# Patient Record
Sex: Male | Born: 1950
Health system: Southern US, Community
[De-identification: ages and names within clinical notes are randomized; demographics above are authoritative.]

## PROBLEM LIST (undated history)

## (undated) DIAGNOSIS — Z992 Dependence on renal dialysis: Secondary | ICD-10-CM

## (undated) DIAGNOSIS — W19XXXA Unspecified fall, initial encounter: Secondary | ICD-10-CM

## (undated) DIAGNOSIS — H547 Unspecified visual loss: Secondary | ICD-10-CM

## (undated) DIAGNOSIS — Z9289 Personal history of other medical treatment: Secondary | ICD-10-CM

## (undated) DIAGNOSIS — R233 Spontaneous ecchymoses: Secondary | ICD-10-CM

## (undated) DIAGNOSIS — E1140 Type 2 diabetes mellitus with diabetic neuropathy, unspecified: Secondary | ICD-10-CM

## (undated) DIAGNOSIS — I10 Essential (primary) hypertension: Secondary | ICD-10-CM

## (undated) DIAGNOSIS — E119 Type 2 diabetes mellitus without complications: Secondary | ICD-10-CM

## (undated) DIAGNOSIS — K635 Polyp of colon: Secondary | ICD-10-CM

## (undated) DIAGNOSIS — N186 End stage renal disease: Secondary | ICD-10-CM

## (undated) DIAGNOSIS — E663 Overweight: Secondary | ICD-10-CM

## (undated) DIAGNOSIS — J189 Pneumonia, unspecified organism: Secondary | ICD-10-CM

## (undated) DIAGNOSIS — Z970 Presence of artificial eye: Secondary | ICD-10-CM

## (undated) DIAGNOSIS — N289 Disorder of kidney and ureter, unspecified: Secondary | ICD-10-CM

## (undated) DIAGNOSIS — R238 Other skin changes: Secondary | ICD-10-CM

## (undated) DIAGNOSIS — R011 Cardiac murmur, unspecified: Secondary | ICD-10-CM

## (undated) DIAGNOSIS — L0390 Cellulitis, unspecified: Secondary | ICD-10-CM

## (undated) DIAGNOSIS — E785 Hyperlipidemia, unspecified: Secondary | ICD-10-CM

## (undated) DIAGNOSIS — Z803 Family history of malignant neoplasm of breast: Secondary | ICD-10-CM

## (undated) DIAGNOSIS — I739 Peripheral vascular disease, unspecified: Secondary | ICD-10-CM

## (undated) DIAGNOSIS — I499 Cardiac arrhythmia, unspecified: Secondary | ICD-10-CM

## (undated) DIAGNOSIS — I959 Hypotension, unspecified: Secondary | ICD-10-CM

## (undated) DIAGNOSIS — M1990 Unspecified osteoarthritis, unspecified site: Secondary | ICD-10-CM

## (undated) DIAGNOSIS — C61 Malignant neoplasm of prostate: Secondary | ICD-10-CM

## (undated) DIAGNOSIS — N200 Calculus of kidney: Secondary | ICD-10-CM

## (undated) DIAGNOSIS — D649 Anemia, unspecified: Secondary | ICD-10-CM

## (undated) DIAGNOSIS — I2510 Atherosclerotic heart disease of native coronary artery without angina pectoris: Secondary | ICD-10-CM

## (undated) HISTORY — DX: Atherosclerotic heart disease of native coronary artery without angina pectoris: I25.10

## (undated) HISTORY — DX: Anemia, unspecified: D64.9

## (undated) HISTORY — DX: Unspecified visual loss: H54.7

## (undated) HISTORY — DX: Overweight: E66.3

## (undated) HISTORY — DX: Dependence on renal dialysis: Z99.2

## (undated) HISTORY — DX: Hyperlipidemia, unspecified: E78.5

## (undated) HISTORY — DX: Type 2 diabetes mellitus without complications: E11.9

## (undated) HISTORY — DX: Polyp of colon: K63.5

## (undated) HISTORY — PX: VARICOSE VEIN SURGERY: SHX832

## (undated) HISTORY — PX: COLONOSCOPY: SHX174

## (undated) HISTORY — DX: Spontaneous ecchymoses: R23.3

## (undated) HISTORY — DX: Other skin changes: R23.8

## (undated) HISTORY — DX: Family history of malignant neoplasm of breast: Z80.3

## (undated) HISTORY — DX: Essential (primary) hypertension: I10

## (undated) HISTORY — PX: FOOT AMPUTATION: SHX951

## (undated) HISTORY — PX: FRACTURE SURGERY: SHX138

## (undated) HISTORY — PX: RADIOFREQUENCY ABLATION KIDNEY: SHX2292

## (undated) HISTORY — PX: ENUCLEATION: SHX628

---

## 1990-04-04 HISTORY — PX: CHOLECYSTECTOMY OPEN: SUR202

## 1998-10-05 ENCOUNTER — Other Ambulatory Visit: Admission: RE | Admit: 1998-10-05 | Discharge: 1998-10-05 | Payer: Self-pay | Admitting: *Deleted

## 2000-03-24 ENCOUNTER — Ambulatory Visit (HOSPITAL_COMMUNITY): Admission: RE | Admit: 2000-03-24 | Discharge: 2000-03-24 | Payer: Self-pay | Admitting: Family Medicine

## 2000-03-31 ENCOUNTER — Encounter: Admission: RE | Admit: 2000-03-31 | Discharge: 2000-06-29 | Payer: Self-pay | Admitting: Family Medicine

## 2000-12-22 ENCOUNTER — Inpatient Hospital Stay (HOSPITAL_COMMUNITY): Admission: EM | Admit: 2000-12-22 | Discharge: 2000-12-27 | Payer: Self-pay | Admitting: Emergency Medicine

## 2000-12-22 ENCOUNTER — Encounter: Payer: Self-pay | Admitting: Internal Medicine

## 2001-01-01 ENCOUNTER — Encounter: Admission: RE | Admit: 2001-01-01 | Discharge: 2001-04-01 | Payer: Self-pay | Admitting: Internal Medicine

## 2001-05-20 ENCOUNTER — Encounter: Admission: RE | Admit: 2001-05-20 | Discharge: 2001-07-06 | Payer: Self-pay | Admitting: Internal Medicine

## 2001-09-17 ENCOUNTER — Encounter (HOSPITAL_BASED_OUTPATIENT_CLINIC_OR_DEPARTMENT_OTHER): Admission: RE | Admit: 2001-09-17 | Discharge: 2001-12-16 | Payer: Self-pay | Admitting: Internal Medicine

## 2001-11-21 ENCOUNTER — Encounter (INDEPENDENT_AMBULATORY_CARE_PROVIDER_SITE_OTHER): Payer: Self-pay | Admitting: *Deleted

## 2001-11-21 ENCOUNTER — Ambulatory Visit (HOSPITAL_COMMUNITY): Admission: RE | Admit: 2001-11-21 | Discharge: 2001-11-21 | Payer: Self-pay | Admitting: *Deleted

## 2001-11-21 ENCOUNTER — Encounter (INDEPENDENT_AMBULATORY_CARE_PROVIDER_SITE_OTHER): Payer: Self-pay | Admitting: Specialist

## 2001-11-29 ENCOUNTER — Encounter: Payer: Self-pay | Admitting: Ophthalmology

## 2001-12-04 ENCOUNTER — Ambulatory Visit (HOSPITAL_COMMUNITY): Admission: RE | Admit: 2001-12-04 | Discharge: 2001-12-05 | Payer: Self-pay | Admitting: Ophthalmology

## 2001-12-24 ENCOUNTER — Encounter (HOSPITAL_BASED_OUTPATIENT_CLINIC_OR_DEPARTMENT_OTHER): Admission: RE | Admit: 2001-12-24 | Discharge: 2001-12-28 | Payer: Self-pay | Admitting: Internal Medicine

## 2002-01-23 ENCOUNTER — Encounter (HOSPITAL_BASED_OUTPATIENT_CLINIC_OR_DEPARTMENT_OTHER): Admission: RE | Admit: 2002-01-23 | Discharge: 2002-04-23 | Payer: Self-pay | Admitting: Internal Medicine

## 2002-03-11 ENCOUNTER — Encounter: Payer: Self-pay | Admitting: Orthopedic Surgery

## 2002-03-11 ENCOUNTER — Encounter: Admission: RE | Admit: 2002-03-11 | Discharge: 2002-03-11 | Payer: Self-pay | Admitting: Orthopedic Surgery

## 2002-04-26 ENCOUNTER — Encounter (HOSPITAL_BASED_OUTPATIENT_CLINIC_OR_DEPARTMENT_OTHER): Admission: RE | Admit: 2002-04-26 | Discharge: 2002-07-25 | Payer: Self-pay | Admitting: Internal Medicine

## 2002-05-03 ENCOUNTER — Ambulatory Visit (HOSPITAL_COMMUNITY): Admission: RE | Admit: 2002-05-03 | Discharge: 2002-05-04 | Payer: Self-pay | Admitting: Ophthalmology

## 2002-05-05 ENCOUNTER — Emergency Department (HOSPITAL_COMMUNITY): Admission: EM | Admit: 2002-05-05 | Discharge: 2002-05-05 | Payer: Self-pay | Admitting: Emergency Medicine

## 2002-11-01 ENCOUNTER — Emergency Department (HOSPITAL_COMMUNITY): Admission: EM | Admit: 2002-11-01 | Discharge: 2002-11-01 | Payer: Self-pay | Admitting: Emergency Medicine

## 2003-02-03 ENCOUNTER — Emergency Department (HOSPITAL_COMMUNITY): Admission: EM | Admit: 2003-02-03 | Discharge: 2003-02-04 | Payer: Self-pay | Admitting: Emergency Medicine

## 2004-08-11 ENCOUNTER — Inpatient Hospital Stay (HOSPITAL_COMMUNITY): Admission: AD | Admit: 2004-08-11 | Discharge: 2004-08-13 | Payer: Self-pay | Admitting: Internal Medicine

## 2004-08-12 ENCOUNTER — Encounter (INDEPENDENT_AMBULATORY_CARE_PROVIDER_SITE_OTHER): Payer: Self-pay | Admitting: *Deleted

## 2005-10-16 ENCOUNTER — Emergency Department (HOSPITAL_COMMUNITY): Admission: EM | Admit: 2005-10-16 | Discharge: 2005-10-16 | Payer: Self-pay | Admitting: Emergency Medicine

## 2005-11-21 ENCOUNTER — Ambulatory Visit (HOSPITAL_COMMUNITY): Admission: RE | Admit: 2005-11-21 | Discharge: 2005-11-21 | Payer: Self-pay | Admitting: Ophthalmology

## 2005-11-21 ENCOUNTER — Encounter (INDEPENDENT_AMBULATORY_CARE_PROVIDER_SITE_OTHER): Payer: Self-pay | Admitting: *Deleted

## 2005-11-22 ENCOUNTER — Inpatient Hospital Stay (HOSPITAL_COMMUNITY): Admission: EM | Admit: 2005-11-22 | Discharge: 2005-11-26 | Payer: Self-pay | Admitting: Emergency Medicine

## 2005-11-22 ENCOUNTER — Ambulatory Visit: Payer: Self-pay | Admitting: Internal Medicine

## 2005-11-24 ENCOUNTER — Encounter: Payer: Self-pay | Admitting: Cardiology

## 2006-08-04 ENCOUNTER — Encounter: Admission: RE | Admit: 2006-08-04 | Discharge: 2006-08-04 | Payer: Self-pay | Admitting: Urology

## 2006-09-01 ENCOUNTER — Ambulatory Visit: Payer: Self-pay | Admitting: Cardiology

## 2006-09-13 ENCOUNTER — Encounter (HOSPITAL_COMMUNITY): Admission: RE | Admit: 2006-09-13 | Discharge: 2006-12-05 | Payer: Self-pay | Admitting: Nephrology

## 2006-09-29 ENCOUNTER — Ambulatory Visit: Payer: Self-pay

## 2006-10-03 ENCOUNTER — Ambulatory Visit: Payer: Self-pay

## 2006-10-20 ENCOUNTER — Ambulatory Visit: Payer: Self-pay | Admitting: Cardiology

## 2007-02-21 ENCOUNTER — Encounter: Admission: RE | Admit: 2007-02-21 | Discharge: 2007-02-21 | Payer: Self-pay | Admitting: Urology

## 2007-03-09 ENCOUNTER — Ambulatory Visit: Payer: Self-pay | Admitting: Cardiovascular Disease

## 2007-03-09 ENCOUNTER — Observation Stay (HOSPITAL_COMMUNITY): Admission: EM | Admit: 2007-03-09 | Discharge: 2007-03-10 | Payer: Self-pay | Admitting: Emergency Medicine

## 2007-03-20 ENCOUNTER — Encounter: Admission: RE | Admit: 2007-03-20 | Discharge: 2007-03-20 | Payer: Self-pay | Admitting: Urology

## 2007-04-13 ENCOUNTER — Ambulatory Visit (HOSPITAL_COMMUNITY): Admission: RE | Admit: 2007-04-13 | Discharge: 2007-04-14 | Payer: Self-pay | Admitting: Interventional Radiology

## 2007-04-13 ENCOUNTER — Encounter (INDEPENDENT_AMBULATORY_CARE_PROVIDER_SITE_OTHER): Payer: Self-pay | Admitting: Interventional Radiology

## 2007-04-27 ENCOUNTER — Ambulatory Visit: Payer: Self-pay | Admitting: Cardiology

## 2007-05-15 ENCOUNTER — Encounter: Admission: RE | Admit: 2007-05-15 | Discharge: 2007-05-15 | Payer: Self-pay | Admitting: Interventional Radiology

## 2007-06-01 ENCOUNTER — Encounter (HOSPITAL_COMMUNITY): Admission: RE | Admit: 2007-06-01 | Discharge: 2007-06-08 | Payer: Self-pay | Admitting: Nephrology

## 2007-06-15 ENCOUNTER — Ambulatory Visit: Payer: Self-pay | Admitting: Cardiology

## 2007-06-15 LAB — CONVERTED CEMR LAB
AST: 18 units/L (ref 0–37)
Albumin: 3 g/dL — ABNORMAL LOW (ref 3.5–5.2)
Alkaline Phosphatase: 100 units/L (ref 39–117)
BUN: 49 mg/dL — ABNORMAL HIGH (ref 6–23)
Calcium: 8.8 mg/dL (ref 8.4–10.5)
Chloride: 110 meq/L (ref 96–112)
Creatinine, Ser: 1.7 mg/dL — ABNORMAL HIGH (ref 0.4–1.5)
Total Bilirubin: 0.5 mg/dL (ref 0.3–1.2)
VLDL: 25 mg/dL (ref 0–40)

## 2007-08-03 ENCOUNTER — Ambulatory Visit: Payer: Self-pay | Admitting: Cardiology

## 2007-08-03 LAB — CONVERTED CEMR LAB
Albumin: 2.9 g/dL — ABNORMAL LOW (ref 3.5–5.2)
Alkaline Phosphatase: 103 units/L (ref 39–117)
Bilirubin, Direct: 0.1 mg/dL (ref 0.0–0.3)
LDL Cholesterol: 44 mg/dL (ref 0–99)
Total CHOL/HDL Ratio: 3.5

## 2007-11-09 ENCOUNTER — Ambulatory Visit: Payer: Self-pay | Admitting: Cardiology

## 2007-12-17 ENCOUNTER — Encounter: Admission: RE | Admit: 2007-12-17 | Discharge: 2007-12-17 | Payer: Self-pay | Admitting: Nephrology

## 2008-01-08 DIAGNOSIS — R609 Edema, unspecified: Secondary | ICD-10-CM

## 2008-01-08 DIAGNOSIS — F329 Major depressive disorder, single episode, unspecified: Secondary | ICD-10-CM

## 2008-01-08 DIAGNOSIS — I2510 Atherosclerotic heart disease of native coronary artery without angina pectoris: Secondary | ICD-10-CM

## 2008-01-08 DIAGNOSIS — E785 Hyperlipidemia, unspecified: Secondary | ICD-10-CM

## 2008-01-08 DIAGNOSIS — E663 Overweight: Secondary | ICD-10-CM

## 2008-01-08 DIAGNOSIS — E118 Type 2 diabetes mellitus with unspecified complications: Secondary | ICD-10-CM

## 2008-01-08 DIAGNOSIS — Z951 Presence of aortocoronary bypass graft: Secondary | ICD-10-CM

## 2008-01-08 DIAGNOSIS — F32A Depression, unspecified: Secondary | ICD-10-CM | POA: Insufficient documentation

## 2008-01-08 HISTORY — DX: Overweight: E66.3

## 2008-01-08 HISTORY — DX: Hyperlipidemia, unspecified: E78.5

## 2008-01-08 HISTORY — DX: Atherosclerotic heart disease of native coronary artery without angina pectoris: I25.10

## 2008-04-21 ENCOUNTER — Ambulatory Visit: Payer: Self-pay | Admitting: Cardiology

## 2008-04-21 LAB — CONVERTED CEMR LAB
ALT: 19 units/L (ref 0–53)
Albumin: 3.3 g/dL — ABNORMAL LOW (ref 3.5–5.2)
HDL: 39.6 mg/dL (ref >39.0)
Total Bilirubin: 0.8 mg/dL (ref 0.3–1.2)
Total CHOL/HDL Ratio: 2.8
Triglycerides: 89 mg/dL (ref 0–149)
VLDL: 18 mg/dL (ref 0–40)

## 2008-05-26 ENCOUNTER — Ambulatory Visit: Payer: Self-pay | Admitting: Cardiology

## 2008-06-04 ENCOUNTER — Encounter: Admission: RE | Admit: 2008-06-04 | Discharge: 2008-06-04 | Payer: Self-pay | Admitting: Interventional Radiology

## 2008-06-04 ENCOUNTER — Ambulatory Visit (HOSPITAL_COMMUNITY): Admission: RE | Admit: 2008-06-04 | Discharge: 2008-06-04 | Payer: Self-pay | Admitting: Interventional Radiology

## 2008-08-05 ENCOUNTER — Encounter: Payer: Self-pay | Admitting: Cardiology

## 2008-08-13 ENCOUNTER — Encounter: Payer: Self-pay | Admitting: Cardiology

## 2008-09-29 ENCOUNTER — Ambulatory Visit (HOSPITAL_COMMUNITY): Admission: RE | Admit: 2008-09-29 | Discharge: 2008-09-29 | Payer: Self-pay | Admitting: Nephrology

## 2008-12-04 ENCOUNTER — Encounter: Payer: Self-pay | Admitting: Cardiology

## 2009-01-01 ENCOUNTER — Encounter: Payer: Self-pay | Admitting: Cardiology

## 2009-01-27 ENCOUNTER — Encounter: Payer: Self-pay | Admitting: Gastroenterology

## 2009-01-27 DIAGNOSIS — D649 Anemia, unspecified: Secondary | ICD-10-CM

## 2009-01-27 DIAGNOSIS — Z992 Dependence on renal dialysis: Secondary | ICD-10-CM

## 2009-01-27 DIAGNOSIS — N186 End stage renal disease: Secondary | ICD-10-CM

## 2009-01-27 DIAGNOSIS — I10 Essential (primary) hypertension: Secondary | ICD-10-CM | POA: Insufficient documentation

## 2009-01-27 DIAGNOSIS — H409 Unspecified glaucoma: Secondary | ICD-10-CM | POA: Insufficient documentation

## 2009-01-27 HISTORY — DX: Essential (primary) hypertension: I10

## 2009-01-30 ENCOUNTER — Encounter: Payer: Self-pay | Admitting: Gastroenterology

## 2009-01-30 ENCOUNTER — Ambulatory Visit: Payer: Self-pay | Admitting: Gastroenterology

## 2009-02-03 ENCOUNTER — Telehealth: Payer: Self-pay | Admitting: Gastroenterology

## 2009-02-12 ENCOUNTER — Ambulatory Visit: Payer: Self-pay | Admitting: Gastroenterology

## 2009-02-12 DIAGNOSIS — D126 Benign neoplasm of colon, unspecified: Secondary | ICD-10-CM | POA: Insufficient documentation

## 2009-02-19 ENCOUNTER — Telehealth: Payer: Self-pay | Admitting: Gastroenterology

## 2009-02-20 ENCOUNTER — Ambulatory Visit (HOSPITAL_COMMUNITY): Admission: RE | Admit: 2009-02-20 | Discharge: 2009-02-20 | Payer: Self-pay | Admitting: Gastroenterology

## 2009-02-20 ENCOUNTER — Ambulatory Visit: Payer: Self-pay | Admitting: Gastroenterology

## 2009-03-18 ENCOUNTER — Encounter (INDEPENDENT_AMBULATORY_CARE_PROVIDER_SITE_OTHER): Payer: Self-pay | Admitting: *Deleted

## 2009-04-15 ENCOUNTER — Encounter: Payer: Self-pay | Admitting: Cardiology

## 2009-04-17 ENCOUNTER — Encounter: Payer: Self-pay | Admitting: Cardiology

## 2009-05-27 ENCOUNTER — Ambulatory Visit: Payer: Self-pay | Admitting: Cardiology

## 2009-05-27 DIAGNOSIS — I739 Peripheral vascular disease, unspecified: Secondary | ICD-10-CM

## 2009-06-09 ENCOUNTER — Encounter: Admission: RE | Admit: 2009-06-09 | Discharge: 2009-06-09 | Payer: Self-pay | Admitting: Interventional Radiology

## 2009-06-09 ENCOUNTER — Ambulatory Visit (HOSPITAL_COMMUNITY): Admission: RE | Admit: 2009-06-09 | Discharge: 2009-06-09 | Payer: Self-pay | Admitting: Interventional Radiology

## 2009-06-09 ENCOUNTER — Ambulatory Visit: Payer: Self-pay

## 2009-06-09 ENCOUNTER — Ambulatory Visit: Payer: Self-pay | Admitting: Cardiology

## 2009-07-27 ENCOUNTER — Encounter (HOSPITAL_COMMUNITY): Admission: RE | Admit: 2009-07-27 | Discharge: 2009-10-15 | Payer: Self-pay | Admitting: Nephrology

## 2009-10-23 ENCOUNTER — Encounter: Payer: Self-pay | Admitting: Cardiology

## 2009-11-27 ENCOUNTER — Encounter: Admission: RE | Admit: 2009-11-27 | Discharge: 2009-11-27 | Payer: Self-pay | Admitting: Nephrology

## 2009-12-02 ENCOUNTER — Ambulatory Visit: Payer: Self-pay | Admitting: Cardiology

## 2009-12-21 ENCOUNTER — Ambulatory Visit: Payer: Self-pay

## 2009-12-21 ENCOUNTER — Encounter: Payer: Self-pay | Admitting: Cardiology

## 2009-12-21 ENCOUNTER — Ambulatory Visit: Payer: Self-pay | Admitting: Internal Medicine

## 2009-12-21 ENCOUNTER — Ambulatory Visit (HOSPITAL_COMMUNITY): Admission: RE | Admit: 2009-12-21 | Discharge: 2009-12-21 | Payer: Self-pay | Admitting: Cardiology

## 2010-01-07 ENCOUNTER — Telehealth: Payer: Self-pay | Admitting: Gastroenterology

## 2010-01-21 ENCOUNTER — Ambulatory Visit (HOSPITAL_COMMUNITY): Admission: RE | Admit: 2010-01-21 | Discharge: 2010-01-22 | Payer: Self-pay | Admitting: Nephrology

## 2010-01-21 ENCOUNTER — Encounter (INDEPENDENT_AMBULATORY_CARE_PROVIDER_SITE_OTHER): Payer: Self-pay | Admitting: Nephrology

## 2010-01-27 ENCOUNTER — Ambulatory Visit: Payer: Self-pay | Admitting: Vascular Surgery

## 2010-02-02 HISTORY — PX: AV FISTULA PLACEMENT: SHX1204

## 2010-02-10 ENCOUNTER — Encounter: Admission: RE | Admit: 2010-02-10 | Discharge: 2010-02-10 | Payer: Self-pay | Admitting: Interventional Radiology

## 2010-02-16 ENCOUNTER — Ambulatory Visit: Payer: Self-pay | Admitting: Vascular Surgery

## 2010-02-16 ENCOUNTER — Ambulatory Visit (HOSPITAL_COMMUNITY): Admission: RE | Admit: 2010-02-16 | Discharge: 2010-02-16 | Payer: Self-pay | Admitting: Vascular Surgery

## 2010-02-18 ENCOUNTER — Ambulatory Visit: Payer: Self-pay | Admitting: Vascular Surgery

## 2010-03-04 ENCOUNTER — Ambulatory Visit: Payer: Self-pay | Admitting: Vascular Surgery

## 2010-03-12 ENCOUNTER — Ambulatory Visit (HOSPITAL_COMMUNITY)
Admission: RE | Admit: 2010-03-12 | Discharge: 2010-03-13 | Payer: Self-pay | Source: Home / Self Care | Attending: Interventional Radiology | Admitting: Interventional Radiology

## 2010-04-14 ENCOUNTER — Encounter
Admission: RE | Admit: 2010-04-14 | Discharge: 2010-04-14 | Payer: Self-pay | Source: Home / Self Care | Attending: Interventional Radiology | Admitting: Interventional Radiology

## 2010-04-14 ENCOUNTER — Ambulatory Visit (HOSPITAL_COMMUNITY)
Admission: RE | Admit: 2010-04-14 | Discharge: 2010-04-14 | Payer: Self-pay | Source: Home / Self Care | Attending: Interventional Radiology | Admitting: Interventional Radiology

## 2010-04-25 ENCOUNTER — Encounter: Payer: Self-pay | Admitting: Interventional Radiology

## 2010-04-25 ENCOUNTER — Encounter: Payer: Self-pay | Admitting: Nephrology

## 2010-04-25 ENCOUNTER — Encounter: Payer: Self-pay | Admitting: Urology

## 2010-05-02 LAB — CONVERTED CEMR LAB
ALT: 25 units/L (ref 0–53)
Albumin: 3.3 g/dL — ABNORMAL LOW (ref 3.5–5.2)
Alkaline Phosphatase: 70 units/L (ref 39–117)
Cholesterol: 121 mg/dL (ref 0–200)
LDL Cholesterol: 38 mg/dL (ref 0–99)
Total Protein: 5.7 g/dL — ABNORMAL LOW (ref 6.0–8.3)
Triglycerides: 91 mg/dL (ref 0.0–149.0)

## 2010-05-06 NOTE — Assessment & Plan Note (Signed)
Summary: per check out/sf   Visit Type:  rov Primary [name]:  Dr. Brigitte Pulse  CC:  no cardiac complaints today.  History of Present Illness: [name]  comes in today for evaluation and management of his coronary artery disease.  He continues to work out about 5 hours a day. He denies any angina or ischemic symptoms. His weight is down another 810 pounds.  His wife and his greatest concern today is about the future dialysis. His creatinine is up to 2.8. He is followed by Dr. Meredeth Ide of nephrology and his primary care doctor is Dr. Blenda Bridegroom. I reviewed his laboratory data with him today.  Remarkably, so cholesterol is 143 triglycerides 74 HDL 70 LDL 68. His hemoglobin A1c was 5.1% on no meds. He is only on low-dose Lipitor and aspirin.  His last stress Myoview was in 2008 which showed no ischemia or scar. His last echocardiogram was 2007 showed an ejection fraction of 55%.  He had arterial Dopplers in March of 2011. His ABIs were felt to be falsely elevated because of calcified vessels., however, his TBI was 0.83 on the left. He had triphasic flow through the posterior tibial artery and PDA. He's had a right trans-met amputation  Current Medications (verified): 1)  Lipitor 10 Mg Tabs (Atorvastatin Calcium) .... Take One Tablet By Mouth Daily. 2)  Aspirin 81 Mg Tabs (Aspirin) .[name].. 1 By Mouth Once Daily 3)  Procrit 20000 Unit/ml Soln (Epoetin Alfa) .... Every 2 Weeks 4)  Ferrous Sulfate 325 (65 Fe) Mg Tabs (Ferrous Sulfate) .[name].. 1 By Mouth Once Daily 5)  Finasteride 5 Mg Tabs (Finasteride) .[name].. 1 Tab Once Daily  Allergies: 1)  ! Codeine  Past History:  Past Medical History: Last updated: 01/26/2009 CAD Mixed Hyperlipidemia Diabetes Type 2 Morbid Obesity Chronic Kidney Disease Lower Extremity Edema Hypertension Renal Failure Varicose Veins/Phlebitis Normal left ventricular systolic function renal cell carcinoma status post ablation  Past Surgical History: Last updated:  01/27/2009 Lower Extremity Venectomy Cholecystectomy removal of both eyes removal left partial foot  Family History: Last updated: 02/12/2009 Family History of Cancer: Mother died @ 21, Father died @ 80. Family History of Coronary Artery Disease: Siblings Family History of Kidney Disease: father Family History of Breast Cancer: Family History of Ovarian Cancer: Family History of Lung Cancer: Brother Family History of Diabetes No FH of Colon Cancer:  Social History: Last updated: 01/27/2009 Disabled  Married with two sons Tobacco Use - No.  Alcohol Use - no Regular Exercise - no Drug Use - no  Risk Factors: Exercise: no (01/08/2008)  Risk Factors: Smoking Status: never (01/08/2008)  Review of Systems       negative other than history of present illness  Vital Signs:  Patient profile:   60 year old male Height:      76 inches Weight:      192.8 pounds BMI:     23.55 Pulse rate:   66 / minute Pulse rhythm:   irregular BP sitting:   136 / 72  (right arm) Cuff size:   large  Vitals Entered By: Julaine Hua, CMA (December 02, 2009 3:17 PM)  Physical Exam  General:  remarkably good shape from exercise, no acute distress, blind Head:  normocephalic and atraumatic Eyes:  blind Neck:  Neck supple, no JVD. No masses, thyromegaly or abnormal cervical nodes. Chest Wall:  no deformities or breast masses noted Lungs:  Clear bilaterally to auscultation and percussion. Heart:  PMI nondisplaced, normal S1-S2, no murmur or gallop. Carotids  equal bilaterally there bruit Msk:  Back normal, normal gait. Muscle strength and tone normal. Pulses:  left lower extremity with 1+ over 4+ dorsalis pedis, or other distal pulses difficult to feel, right lower extremity with partial habitation Extremities:  no edema Neurologic:  Alert and oriented x 3. Skin:  Intact without lesions or rashes. bruises of the upper extremity Psych:  depressed affect.     Impression &  Recommendations:  Problem # 1:  PVD (ICD-443.9) Assessment Unchanged  Problem # 2:  RENAL DISEASE, CHRONIC (ICD-593.9) Assessment: Deteriorated In anticipation  of potential dialysis, will perform a 2-D echocardiogram to assess LV function.  Problem # 3:  HYPERTENSION (ICD-401.9) Assessment: Improved  The following medications were removed from the medication list:    Furosemide 40 Mg Tabs (Furosemide) .[name]... 1 by mouth two times a day His updated medication list for this problem includes:    Aspirin 81 Mg Tabs (Aspirin) .[name]... 1 by mouth once daily  Problem # 4:  EDEMA (ICD-782.3) Assessment: Improved  Problem # 5:  HYPERLIPIDEMIA-MIXED (B2193296.4) Assessment: Improved  His updated medication list for this problem includes:    Lipitor 10 Mg Tabs (Atorvastatin calcium) .[name]... Take one tablet by mouth daily.  Problem # 6:  CAD, NATIVE VESSEL (ICD-414.01) Assessment: Unchanged  His updated medication list for this problem includes:    Aspirin 81 Mg Tabs (Aspirin) .[name]... 1 by mouth once daily  Orders: Echocardiogram (Echo)  Clinical Reports Reviewed:  Cardiac Cath:  08/30/2001: Cardiac Cath Findings:  Final Diagnosis: 1. Single-vessel coronary artery disease with 70% mid left anterior descending artery lesion with antegrade flow and good distal runoff. 2. Normal circumflex and right coronary arteries. 3. Normal left ventricular function. 4. Normal mitral and aortic valves. 5. Normal abdominal aorta and renal arteries.  Dispostion: Will follow up in the office in one week and review his clinical status and correlate with the cardiac catherization findings and treadmill exercise tolerence test. We also encouraged to increase activity and if not tolerated, he would  then be considered to be a candidate for angioplasty with stent placement in his mid left anterior descending artery lesion.  John R. Tysinger, M.D.  08/04/1995: Cardiac Cath Findings:  Conculsions: 1) Syncope,  eitology unknown. Probably vasa vagal. 2) AODM greater than 10 years. 3) Exogenous obesity. 4) GERD 5) Past history of lower leg cellulitis, venostasis disease and past varicose vein stripping. 6) Staus post cholecystecomy 1986.  Terance Ice, M.D. Cardiac Cath Findings:  Ejection Fraction:  >=50%.  30% LAD stenosis  Nuclear Study:  10/03/2006:  Excerise capacity: Addenosine with no exercise.  Blood Pressure response:  Clinical symptoms:  ECG impression: No significant ST segment change suggestive of ischemia.  Overall impression: There is no sign of scar or ischemia; ther is mild LVE and LV dysfunction.  Kirk Ruths, MD   Patient Instructions: 1)  Your physician recommends that you schedule a follow-up appointment in: 6 months with Dr. Verl Blalock 2)  Your physician has requested that you have an echocardiogram.  Echocardiography is a painless test that uses sound waves to create images of your heart. It provides your doctor with information about the size and shape of your heart and how well your heart's chambers and valves are working.  This procedure takes approximately one hour. There are no restrictions for this procedure. 3)  Your physician recommends that you continue on your current medications as directed. Please refer to the Current Medication list given to you today.

## 2010-05-06 NOTE — Progress Notes (Signed)
Summary: Schedule Recall Colonoscopy for Hospital  Phone Note Outgoing Call Call back at [facility] Phone [phone]   Call placed by: Genella Mech CMA Deborra Medina),  January 07, 2010 2:44 PM Summary of Call: Called pt to schedule hospital colonoscopy, no anwer L/M for pt to return call. Initial call taken by: Genella Mech CMA Deborra Medina),  January 07, 2010 2:46 PM  Follow-up for Phone Call        Just contacted pt to have recall colonoscopy, Pt states that he has to have a kidney biopsy, His nephrologisit just discovered a spot on his kidney, Pt stated "I Have to care of this first". I told pt to contact us as soon as he is available to have his procedure. Follow-up by: Genella Mech CMA Deborra Medina),  January 11, 2010 10:52 AM  Additional Follow-up for Phone Call Additional follow up Details #1::        FYI Dr Deatra Ina    Additional Follow-up for Phone Call Additional follow up Details #2::    ok Follow-up by: [name] MD,  January 11, 2010 11:13 AM

## 2010-05-06 NOTE — Assessment & Plan Note (Signed)
Summary: CAD/ANAS  Medications Added LIPITOR 20 MG TABS (ATORVASTATIN CALCIUM) Take 1 tablet by mouth every night FINASTERIDE 5 MG TABS (FINASTERIDE) 1 tab once daily        Visit Type:  rov Primary Provider:  Dr. Brigitte Pulse  CC:  no cardiac complaints today.  History of Present Illness: [name] comes in today for followup of his history of nonobstructive coronary disease, hypertension, hyperlipidemia, and peripheral vascular disease.  He has lost down to 199 pounds. At one time, he weighed 340 pounds.  He exercises about 45 hours a day according to his wife. He is legally blind but manages to do a stationary bicycle and other calisthenics.  He's had no chest pain or angina.  He has a history of peripheral vascular disease and has had a partial amputation of his right foot several years ago. This was from gangrene. He has not had followup arterial Dopplers.  Current Medications (verified): 1)  Lipitor 10 Mg Tabs (Atorvastatin Calcium) .... Take One Tablet By Mouth Daily. 2)  Aspirin 81 Mg Tabs (Aspirin) .[name].. 1 By Mouth Once Daily 3)  Furosemide 40 Mg Tabs (Furosemide) .[name].. 1 By Mouth Two Times A Day 4)  Procrit 20000 Unit/ml Soln (Epoetin Alfa) .... Every 2 Weeks 5)  Ferrous Sulfate 325 (65 Fe) Mg Tabs (Ferrous Sulfate) .[name].. 1 By Mouth Once Daily 6)  Finasteride 5 Mg Tabs (Finasteride) .[name].. 1 Tab Once Daily  Allergies: 1)  ! Codeine  Past History:  Past Medical History: Last updated: 01/26/2009 CAD Mixed Hyperlipidemia Diabetes Type 2 Morbid Obesity Chronic Kidney Disease Lower Extremity Edema Hypertension Renal Failure Varicose Veins/Phlebitis Normal left ventricular systolic function renal cell carcinoma status post ablation  Past Surgical History: Last updated: 01/27/2009 Lower Extremity Venectomy Cholecystectomy removal of both eyes removal left partial foot  Family History: Last updated: 02/12/2009 Family History of Cancer: Mother died @ 93, Father died  @ 30. Family History of Coronary Artery Disease: Siblings Family History of Kidney Disease: father Family History of Breast Cancer: Family History of Ovarian Cancer: Family History of Lung Cancer: Brother Family History of Diabetes No FH of Colon Cancer:  Social History: Last updated: 01/27/2009 Disabled  Married with two sons Tobacco Use - No.  Alcohol Use - no Regular Exercise - no Drug Use - no  Risk Factors: Exercise: no (01/08/2008)  Risk Factors: Smoking Status: never (01/08/2008)  Review of Systems       negative other than history of present illness  Vital Signs:  Patient profile:   60 year old male Height:      76 inches Weight:      199 pounds BMI:     24.31 Pulse rate:   54 / minute Pulse rhythm:   irregular BP sitting:   144 / 80  (left arm) Cuff size:   large  Vitals Entered By: Julaine Hua, CMA (May 27, 2009 4:14 PM)  Physical Exam  General:  no acute distress, eyes are closed, very interactive, alert oriented, he's lost a lot of weight Head:  normocephalic and atraumatic Eyes:  blind and mostly shut Neck:  Neck supple, no JVD. No masses, thyromegaly or abnormal cervical nodes. Chest [name]:  no deformities or breast masses noted Lungs:  Clear bilaterally to auscultation and percussion. Heart:  Non-displaced PMI, chest non-tender; regular rate and rhythm, S1, S2 without murmurs, rubs or gallops. Carotid upstroke normal, no bruit. Normal abdominal aortic size, no bruits. Femorals normal pulses, no bruits. Pedals normal pulses. No edema,  no varicosities. Msk:  Back normal, normal gait. Muscle strength and tone normal. Pulses:  pulses are difficult to palpate. He probably has a 1+ dorsalis pedis I can feel. Extremities:  partial dictation the right foot Neurologic:  Alert and oriented x 3. Skin:  Intact without lesions or rashes. Psych:  Normal affect.   Problems:  Medical Problems Added: 1)  Dx of Pvd  (E1707615.9)  Impression &  Recommendations:  Problem # 1:  CAD, NATIVE VESSEL (ICD-414.01) Assessment Unchanged  His updated medication list for this problem includes:    Aspirin 81 Mg Tabs (Aspirin) .[name]... 1 by mouth once daily  Orders: EKG w/ Interpretation (93000)  Problem # 2:  OVERWEIGHT/OBESITY (ICD-278.02) Assessment: Improved  Problem # 3:  HYPERTENSION (ICD-401.9) Assessment: Improved  His updated medication list for this problem includes:    Aspirin 81 Mg Tabs (Aspirin) .[name]... 1 by mouth once daily    Furosemide 40 Mg Tabs (Furosemide) .[name]... 1 by mouth two times a day  Problem # 4:  HYPERLIPIDEMIA-MIXED (ICD-272.4) Assessment: Improved He is due lipids and LFTs. We'll arrange these to be drawn. The following medications were removed from the medication list:    Lipitor 20 Mg Tabs (Atorvastatin calcium) .[name]... Take 1 tablet by mouth every night His updated medication list for this problem includes:    Lipitor 10 Mg Tabs (Atorvastatin calcium) .[name]... Take one tablet by mouth daily.  Problem # 5:  PVD (ICD-443.9) We will have arterial Dopplers performed.  Other Orders: Arterial Duplex Lower Extremity (Arterial Duplex Low)  Patient Instructions: 1)  Your physician recommends that you schedule a follow-up appointment in: [place] DUE AUGUST 2011 2)  Your physician recommends that you return for lab work in [place] 272.4 V58.69 SAME DAY AS LOWER ART DOP 3)  Your physician recommends that you continue on your current medications as directed. Please refer to the Current Medication list given to you today. 4)  Your physician has requested that you have a lower or upper extremity arterial duplex.  This test is an ultrasound of the arteries in the legs or arms.  It looks at arterial blood flow in the legs and arms.  Allow one hour for Lower and Upper Arterial scans. There are no restrictions or special instructions.[facility]II[facility] LIVER SAME DAY

## 2010-05-06 NOTE — Letter (Signed)
Summary: [facility] Office Note  [facility] Office Note   Imported By: Sallee Provencal 05/01/2009 11:01:47  _____________________________________________________________________  External Attachment:    Type:   Image     Comment:   External Document

## 2010-05-18 ENCOUNTER — Encounter: Payer: Self-pay | Admitting: Cardiology

## 2010-05-20 ENCOUNTER — Telehealth: Payer: Self-pay | Admitting: Cardiology

## 2010-05-26 NOTE — Progress Notes (Signed)
Summary: they need to know about ekg  Phone Note Call from Patient Call back at Home Phone [phone] Call back at [phone]   Caller: Spouse Reason for Call: Talk to Nurse, Talk to Doctor Summary of Call: calling to check on what was said about ekg that was sent over...they want to go out of town but waiting on the call to say it is ok please call asap Initial call taken by: Shelda Pal,  May 20, 2010 12:30 PM  Follow-up for Phone Call        see note from 2/16 at 1231pm.  Francetta Found, RN, BSN  May 20, 2010 12:32 PM  Follow-up by: Joelyn Oms RN,  May 21, 2010 9:10 AM  Additional Follow-up for Phone Call Additional follow up Details #1::        Pt and wife aware of ekg results. Horton Chin RN

## 2010-05-26 NOTE — Progress Notes (Addendum)
Summary: ekg results  Phone Note Call from Patient Call back at Home Phone [phone] Call back at [phone]   Caller: Spouse Summary of Call: pt wife wants to talk to a nurse re ekg results.  Initial call taken by: Regan Lemming,  May 20, 2010 10:08 AM  Follow-up for Phone Call        Pt called for ekg results as sent from Dr. Brigitte Pulse for review. DOD will review today. Follow-up by: Joelyn Oms RN,  May 20, 2010 11:29 AM  Additional Follow-up for Phone Call Additional follow up Details #1::        need EKG Additional Follow-up by: Renella Cunas, MD, [facility],  May 20, 2010 12:17 PM     Appended Document: ekg results Dr. Harrington Challenger reviewed EKG that was faxed yesterday and attempted to call Dr.Shaw but he was not in the office and is not set up with voice mail. Will have Dr.Markeith Jue call Dr.Shaw tomorrow about the EKG and follow up with the patient per Dr.Ross. I left a message on wife's cell phone with this information.

## 2010-06-14 LAB — CROSSMATCH: Unit division: 0

## 2010-06-14 LAB — GLUCOSE, CAPILLARY
Glucose-Capillary: 105 mg/dL — ABNORMAL HIGH (ref 70–99)
Glucose-Capillary: 116 mg/dL — ABNORMAL HIGH (ref 70–99)
Glucose-Capillary: 132 mg/dL — ABNORMAL HIGH (ref 70–99)
Glucose-Capillary: 98 mg/dL (ref 70–99)

## 2010-06-14 LAB — CBC
MCV: 97.2 fL (ref 78.0–100.0)
Platelets: 74 10*3/uL — ABNORMAL LOW (ref 150–400)
RDW: 13.6 % (ref 11.5–15.5)
WBC: 3.1 10*3/uL — ABNORMAL LOW (ref 4.0–10.5)

## 2010-06-14 LAB — BASIC METABOLIC PANEL
BUN: 81 mg/dL — ABNORMAL HIGH (ref 6–23)
Calcium: 8.7 mg/dL (ref 8.4–10.5)
Chloride: 108 mEq/L (ref 96–112)
Creatinine, Ser: 3.94 mg/dL — ABNORMAL HIGH (ref 0.4–1.5)
GFR calc Af Amer: 19 mL/min — ABNORMAL LOW (ref >60)
GFR calc non Af Amer: 16 mL/min — ABNORMAL LOW (ref >60)

## 2010-06-15 LAB — SURGICAL PCR SCREEN: Staphylococcus aureus: NEGATIVE

## 2010-06-15 LAB — POCT I-STAT 4, (NA,K, GLUC, HGB,HCT)
Glucose, Bld: 157 mg/dL — ABNORMAL HIGH (ref 70–99)
HCT: 28 % — ABNORMAL LOW (ref 39.0–52.0)
Potassium: 3.9 mEq/L (ref 3.5–5.1)

## 2010-06-15 LAB — GLUCOSE, CAPILLARY: Glucose-Capillary: 131 mg/dL — ABNORMAL HIGH (ref 70–99)

## 2010-06-16 LAB — CBC
HCT: 29.2 % — ABNORMAL LOW (ref 39.0–52.0)
Hemoglobin: 10 g/dL — ABNORMAL LOW (ref 13.0–17.0)
Hemoglobin: 10.8 g/dL — ABNORMAL LOW (ref 13.0–17.0)
MCH: 32.7 pg (ref 26.0–34.0)
MCH: 32.8 pg (ref 26.0–34.0)
MCH: 32.9 pg (ref 26.0–34.0)
MCHC: 34.2 g/dL (ref 30.0–36.0)
MCV: 95.4 fL (ref 78.0–100.0)
MCV: 95.8 fL (ref 78.0–100.0)
Platelets: 92 10*3/uL — ABNORMAL LOW (ref 150–400)
RBC: 3.06 MIL/uL — ABNORMAL LOW (ref 4.22–5.81)
RBC: 3.29 MIL/uL — ABNORMAL LOW (ref 4.22–5.81)

## 2010-06-16 LAB — COMPREHENSIVE METABOLIC PANEL
ALT: 45 U/L (ref 0–53)
Alkaline Phosphatase: 69 U/L (ref 39–117)
CO2: 27 mEq/L (ref 19–32)
Chloride: 109 mEq/L (ref 96–112)
GFR calc non Af Amer: 15 mL/min — ABNORMAL LOW (ref >60)
Glucose, Bld: 128 mg/dL — ABNORMAL HIGH (ref 70–99)
Potassium: 3.8 mEq/L (ref 3.5–5.1)
Sodium: 145 mEq/L (ref 135–145)
Total Bilirubin: 0.6 mg/dL (ref 0.3–1.2)

## 2010-06-16 LAB — DIFFERENTIAL
Eosinophils Absolute: 0.1 10*3/uL (ref 0.0–0.7)
Eosinophils Relative: 2 % (ref 0–5)
Lymphs Abs: 0.7 10*3/uL (ref 0.7–4.0)
Monocytes Relative: 7 % (ref 3–12)

## 2010-06-16 LAB — TYPE AND SCREEN

## 2010-06-16 LAB — RENAL FUNCTION PANEL
Albumin: 2.9 g/dL — ABNORMAL LOW (ref 3.5–5.2)
BUN: 99 mg/dL — ABNORMAL HIGH (ref 6–23)
Calcium: 8.7 mg/dL (ref 8.4–10.5)
Chloride: 110 mEq/L (ref 96–112)
Creatinine, Ser: 3.76 mg/dL — ABNORMAL HIGH (ref 0.4–1.5)
GFR calc Af Amer: 20 mL/min — ABNORMAL LOW (ref >60)
GFR calc non Af Amer: 17 mL/min — ABNORMAL LOW (ref >60)
Phosphorus: 5.5 mg/dL — ABNORMAL HIGH (ref 2.3–4.6)

## 2010-06-16 LAB — PLATELET FUNCTION ASSAY

## 2010-07-08 LAB — GLUCOSE, CAPILLARY: Glucose-Capillary: 77 mg/dL (ref 70–99)

## 2010-07-09 ENCOUNTER — Encounter: Payer: Self-pay | Admitting: Cardiology

## 2010-07-15 ENCOUNTER — Encounter: Payer: Self-pay | Admitting: Cardiology

## 2010-07-15 ENCOUNTER — Ambulatory Visit (INDEPENDENT_AMBULATORY_CARE_PROVIDER_SITE_OTHER): Payer: 59 | Admitting: Cardiology

## 2010-07-15 VITALS — BP 140/69 | HR 51 | Resp 18 | Ht 76.0 in | Wt 187.4 lb

## 2010-07-15 DIAGNOSIS — I2510 Atherosclerotic heart disease of native coronary artery without angina pectoris: Secondary | ICD-10-CM

## 2010-07-15 NOTE — Patient Instructions (Signed)
Your physician wants you to follow-up in: [place]. You will receive a reminder letter in the mail two months in advance. If you don't receive a letter, please call our office to schedule the follow-up appointment.   Your physician recommends that you continue on your current medications as directed. Please refer to the Current Medication list given to you today.

## 2010-07-15 NOTE — Progress Notes (Signed)
   Patient ID: [name], male    DOB: 23-Oct-1950, 60 y.o.   MRN: [id number]  HPI [name] comes in for follow up of his CAD. He continues to exercise extensively without CP or angina. He has normal LV function by Echo last fall. EKG by Dr Brigitte Pulse on 05/18/10 showed frequent asymptomatic PAC's. He continues to keep his weight down.    Review of Systems  All other systems reviewed and are negative.      Physical Exam  Nursing note and vitals reviewed. Constitutional: He is oriented to person, place, and time. He appears well-developed.       thin  HENT:  Head: Normocephalic and atraumatic.  Eyes:       blind  Neck: Neck supple. No JVD present. No tracheal deviation present. No thyromegaly present.       No carotid bruits  Cardiovascular: Normal rate, regular rhythm, normal heart sounds and intact distal pulses.   No murmur heard. Pulmonary/Chest: Effort normal and breath sounds normal.  Abdominal: Soft. Bowel sounds are normal.  Musculoskeletal: He exhibits no edema.  Neurological: He is alert and oriented to person, place, and time.  Skin: Skin is warm and dry.  Psychiatric: He has a normal mood and affect.

## 2010-07-15 NOTE — Assessment & Plan Note (Signed)
Stable. Wife and pt reassured that he has not had an MI, EF is normal by Echo in past year, and PAC's are of no consequence.

## 2010-08-16 ENCOUNTER — Other Ambulatory Visit: Payer: Self-pay | Admitting: Cardiology

## 2010-08-17 NOTE — Assessment & Plan Note (Signed)
 HEALTHCARE                            CARDIOLOGY OFFICE NOTE   NAME:Casa, KEMAURI DUTY                      MRN:          [id number]  DATE:11/09/2007                            DOB:          1950/05/29    Mr. Gragert comes in today for followup of the following issues,  1. Nonobstructive coronary artery disease.  2. Normal left ventricular systolic function.  3. Mixed hyperlipidemia.   His wife says that his creatinine is now up to 2.3.  He could not have a  nephrectomy for his hypernephroma.   He does 1500 squats a week!.  He is having no exertional angina or  dyspnea.   His meds are,  1. Avodart 0.5 daily.  2. Lipitor 10 mg a day (he was on 20, but his total cholesterol was      103, his LDL was 44 and his HDL dropped from 36 to 29.8).  3. Carvedilol 6.25 b.i.d., which was just cut in half because his      pressures have been running in the 80s outside our office.  4. Lantus 27 units q.p.m.  5. Furosemide 40 mg p.o. b.i.d.  6. Procrit 20,000 units one time a day.  7. Enteric-coated aspirin 81 mg a day.   His blood pressure today is 127/72, his pulse is 55 and regular, his  weight is 275, down 16.  HEENT, ruddy complexion, otherwise unchanged.  Neck shows no JVD.  Carotids are full without bruits.  Thyroid is not  enlarged.  Trachea is midline.  Lungs are clear.  Heart reveals soft S1  and S2, regular rate and rhythm.  Abdominal exam is soft, good bowel  sounds.  Extremities reveal no edema, partial foot amputation on the  right.  His pulses were diminished, but palpable.  Neuro exam is grossly  intact.  He is using a walker.   Mr. Bober is stable from our standpoint.  If his blood pressure gets  low once again, I have advised his wife to cut his carvedilol back to  3.125 mg p.o. b.i.d.  Otherwise, we will leave things unchanged.  I will  see him back again in 6 months.     Thomas C. Verl Blalock, MD, [facility]  Electronically Signed    TCW/MedQ   DD: 11/09/2007  DT: 11/10/2007  Job #: [id number]

## 2010-08-17 NOTE — Assessment & Plan Note (Signed)
[facility]                            CARDIOLOGY OFFICE NOTE   NAME:[name], Nicholas EYER                      MRN:          [id number]  DATE:04/27/2007                            DOB:          March 04, 1951    [name] returns today for management of his nonobstructive coronary  disease.  He is getting along remarkably well with a reduction in his  blood pressure, stabilization of his renal function according to his  wife.  He does recently diagnosed unfortunately, with a hypernephroma  and this was treated percutaneously by interventional radiology.  I am  not sure exactly what they did.   His medicines are unchanged since last visit.  He is on simvastatin 40  nightly.  His last creatinine that I have is was 2.5.   EXAM:  Blood pressure 116/70 his pulse 56 and regular.  He is in sinus  brady no EKG changes from the past.  Weighs 291 which is down 21 pounds!  He is in no acute distress.  Skin is warm and dry.  HEENT:  Unchanged.  Carotids are full.  No bruits.  Thyroid is not enlarged.  Trachea is  midline.  LUNGS:  Clear.  HEART:  Reveals a nondisplaced PMI.  Normal S1-S2.  ABDOMEN:  Soft.  EXTREMITIES:  No edema.  Pulses are present.   [name] is doing well.  I have made no changes program except I have  asked him to switch from simvastatin 40 to Lipitor 20 mg.  I think this  would be better tolerated with his renal insufficiency.  He will need  blood work in 6 weeks which we will arrange.     Thomas C. Verl Blalock, MD, [facility]  Electronically Signed    TCW/MedQ  DD: 04/27/2007  DT: 04/27/2007  Job #: [id number]   cc:   Donato Heinz, M.D.  Janifer Adie, M.D.

## 2010-08-17 NOTE — Assessment & Plan Note (Signed)
[facility]                            CARDIOLOGY OFFICE NOTE   NAME:Keeling, JAREN EILERT                      MRN:          [id number]  DATE:05/26/2008                            DOB:          Jul 18, 1950    [name] comes in today with his wife.  He has lost a tremendous amount of  weight and is now down to 240.  Ten years ago, he weighed 385.  In 2004,  he weighed 340!  His hemoglobin A1c is now 5.3.  His blood pressures  have been so low that they have had to stop his Carvedilol.   MEDICATIONS:  1. Lipitor 10 mg per day.  2. Furosemide 40 mg p.o. b.i.d.  3. Procrit 20,000 units 1 time each week.  4. Enteric-coated aspirin 81 mg a day.  5. Lexapro 10 mg per day.  6. Finasteride 5 mg per day.  7. Lantus 24 units at 8:00 p.m.  8. MetroGel lotion.  9. Sulfacetamide eye drops.   PHYSICAL EXAMINATION:  VITAL SIGNS:  His blood pressure today is 120/78,  his pulse 64 and regular, his weight is 240.  HEENT:  Remarkable for ruddy complexion.  His eyes are partially closed.  NECK:  Supple.  Carotid upstrokes are equal bilaterally without bruits.  Thyroid is not enlarged.  Trachea is midline.  CHEST:  Lungs are clear to auscultation and percussion.  HEART:  Soft S1 and S2.  No gallop.  ABDOMEN:  Soft.  Good bowel sounds.  No midline bruits.  EXTREMITIES:  No edema.  Pulses were present, but reduced.   EKG is normal except for low voltage.   ASSESSMENT/PLAN:  Mr. Ciraulo is stable from my standpoint.  He is  remarkably determined as his wife is supportive to do well.  He has  entered a 5K walk this spring, and he is also walking 2 hours a day in  place to avoid any potential injury on a treadmill or otherwise.  He has  lost a tremendous amount of weight.  He also does a fair number of  abdominal thrusts a day as well.   I have made no changes to his medical program.  We will plan on seeing  him back in a year.     Thomas C. Verl Blalock, MD, [facility]  Electronically Signed    TCW/MedQ  DD: 05/26/2008  DT: 05/27/2008  Job #: [id number]

## 2010-08-17 NOTE — Assessment & Plan Note (Signed)
[facility]                            CARDIOLOGY OFFICE NOTE   NAME:[name], [name]                      MRN:          [id number]  DATE:10/20/2006                            DOB:          05-24-1950    [name] returns today for a followup of his coronary artery disease.   His wife says he is doing much better and is getting great care from Dr.  Marval Regal of nephrology.  His stress Myoview was achieved on October 03, 2006.  It showed an EF of 46% with no significant ischemia or scar.  This is very stable compared to his previous objective studies.  Please  refer to my other notes.   I have reviewed his medications today and I am happy with that.  I  notice he is off an ACE inhibitor which I agree with, with his renal  dysfunction.  He is on hydralazine instead.   I have made no change in his program today.  I will see him back again  in 6 months.     Thomas C. Verl Blalock, MD, [facility]  Electronically Signed    TCW/MedQ  DD: 10/20/2006  DT: 10/20/2006  Job #: [id number]   cc:   Donato Heinz, M.D.

## 2010-08-17 NOTE — Procedures (Signed)
CEPHALIC VEIN MAPPING   INDICATION:  Preop for AV fistula.   HISTORY:  Worsening kidney function, renal cell carcinoma.   EXAM:  The right cephalic vein is partially compressible due to mild chronic  post thrombotic scarring.   Diameter measurements range from 0.28 to 0.52 cm.   The right basilic vein is partially compressible due to mild chronic  post thrombotic scarring.   Diameter measurements range from 0.29 to 0.40 cm.   The left cephalic vein is compressible.   Diameter measurements range from 0.43 to 0.80 cm.   The left basilic vein is not compressible with totally occlusive chronic  post thrombotic scarring noted.   See attached worksheet for all measurements.   IMPRESSION:  Patent bilateral cephalic vein and right basilic veins, as  described above, with diameter measurements noted.  The left basilic  vein appears totally occluded, as described above.   ___________________________________________  Judeth Cornfield. Scot Dock, M.D.   CH/MEDQ  D:  01/27/2010  T:  01/27/2010  Job:  [id number]

## 2010-08-17 NOTE — Consult Note (Signed)
VASCULAR SURGERY CONSULTATION   CLIFF, ROTHFELD  DOB:  October 28, 1950                                       01/27/2010  [id number]   I saw [name] in the office today for evaluation for hemodialysis  access.  This is a pleasant 60 year old gentleman with chronic kidney  disease secondary to longstanding diabetic nephropathy.  Of note, he has  a history of renal cell carcinoma of the right kidney and has undergone  previous radioablation.  Now it is felt that he may have a renal cell  carcinoma on the left.  He is scheduled to see the urologist tomorrow.  Given his progressive renal insufficiency, we were asked to evaluate him  for access.  Of note he is left-handed.  He denies any recent uremic  symptoms except for some increasing edema.  He denies fatigue, anorexia,  nausea, vomiting, palpitations, or shortness of breath.   PAST MEDICAL HISTORY:  His past medical history is significant for adult  onset diabetes, chronic kidney disease, renal cell carcinoma as  described above.  He denies any history of hypertension,  hypercholesterolemia, history of previous myocardial infarction, history  congestive heart failure, or tory of COPD.  In addition, he is blind.   SOCIAL HISTORY:  He is married.  He has 2 children.  He does not use  tobacco or alcohol.   FAMILY HISTORY:  There is no history of premature cardiovascular  disease.   REVIEW OF SYSTEMS:  GENERAL:  He has had no recent weight loss, weight  gain or problems with his appetite.  CARDIOVASCULAR:  He has had no chest pain, chest pressure, palpitations  or arrhythmias.  He has had no history of stroke, TIAs or amaurosis  fugax.  He has had no history of DVT or phlebitis.  GU:  He has had urinary frequency.  Also he has a renal cell carcinoma  as described above.  GI, neurologic, pulmonary, hematologic, ENT, musculoskeletal,  psychiatric, integumentary review of systems is unremarkable and is  documented on the medical history form in his chart.   PHYSICAL EXAMINATION:  This is a pleasant 60 year old gentleman who  appears his stated age.  Blood pressure is 112/67, heart rate is 73, saturation 100%.  HEENT:  He is blind.  HEENT is otherwise unremarkable.  LUNGS:  Clear bilaterally to auscultation.  CARDIOVASCULAR:  I do not detect any carotid bruits.  He has a regular  rate and rhythm.  HE Has palpable femoral pulses and warm well-perfused  feet.  He has palpable radial pulses.  ABDOMEN:  Soft and nontender with normal pitched bowel sounds.  MUSCULOSKELETAL:  There are no major deformities or cyanosis.  NEUROLOGIC:  There is no focal weakness or paresthesias.  SKIN:  There are no ulcers or rashes.   I have reviewed his records from Dr. Thyra Breed office.  His  creatinine was 3.23 on January 07, 2010.  GFR is 20 mL per minute,  potassium is 4.6.  His biopsy of the left upper pole mass on the kidney  shows a clear cell renal cell carcinoma.  I have also reviewed his MRI  which shows the stable right renal mass and also the left renal mass  which is suspicious for cancer.   I have independently interpreted his vein mapping which does show some  post thrombotic scarring in  the right side upper arm cephalic vein and  right upper arm basilic vein.  The left basilic vein is occluded  throughout.   Based on his exam and review of his vein mapping, it appears that his  best option for access would be a left forearm AV fistula.  If this is  not adequate, he could get an the upper arm AV fistula.  If neither are  adequate, he would require an AV graft.  I have discussed the  indications for surgery and potential complications including but not  limited to failure of the fistula to mature, graft thrombosis, graft  infection, steal syndrome, wound healing problems, arm swelling and  bleeding.  All of his questions were answered and he is agreeable to  proceed.  He is going to wait  until he is seen by the urologist before  calling to schedule his surgery.  He is currently not on dialysis.     [name]. Scot Dock, M.D.  Electronically Signed  CSD/MEDQ  D:  01/27/2010  T:  01/28/2010  Job:  [id number]

## 2010-08-17 NOTE — Consult Note (Signed)
[name], [name]               ACCOUNT NO.:  [id number]   MEDICAL RECORD NO.:  [id number]          PATIENT TYPE:  OBV   LOCATION:  62                         FACILITY:  [facility]   PHYSICIAN:  Aurea Graff, MD   DATE OF BIRTH:  [birth year]   DATE OF CONSULTATION:  03/10/2007  DATE OF DISCHARGE:  03/10/2007                                 CONSULTATION   HISTORY OF PRESENT ILLNESS:  This is a 60 year old white male with  diabetes, chronic kidney disease, and non-obstructive coronary artery  disease who was admitted yesterday after a spell at the anemia clinic of  acting confused and nearly passing out. He says both his blood pressure  and blood sugar were low. Apparently, his blood sugar was 57 before even  leaving his house and is not sure what is was at the clinic. He was  there to get a Procrit shot for his chronic anemia. Instead of receiving  the injection, he was transferred over the [facility]  emergency department and admitted. The patient denies actually losing  consciousness. He says he remembers everything that happened. He has had  significant diarrhea lately and feels dehydrated.   REVIEW OF SYSTEMS:  Negative for chest pain, shortness of breath,  dyspnea on exertion, syncope, or palpitations. Positive for diarrhea,  dehydration, fatigue, and blindness.   PAST MEDICAL HISTORY:  1. Diabetes mellitus with secondary blindness due to retinopathy and      chronic kidney disease secondary to nephropathy, approximately      stage 3 CKD.  2. Chronic anemia.  3. Non-obstructive coronary disease. According to Dr. Winnifred Friar clinic      note, he had a 30% to 50% distal LAD on catheterization from the      1990's.  4. Morbid obesity.  5. Pulmonary hypertension, probably due to obstructive sleep apnea      and/or obesity hypoventilation syndrome.   SOCIAL HISTORY:  He is married, unemployed, does not smoke. He tries to  get exercise by running in place  approximately every other day. He has 2  children.   FAMILY HISTORY:  Father died of renal cell carcinoma and also had COPD.  Mother died of breast cancer.   CURRENT MEDICATIONS:  1. Lovenox 65 mg SQ daily.  2. Aspirin 81 mg p.o. daily.  3. Coreg 12.5 mg p.o. b.i.d.  4. Zocor 40 mg p.o. daily.  5. Protonix 40 mg p.o. daily.  6. Proscar.   PHYSICAL EXAMINATION:  VITAL SIGNS:  Temperature 96.8, blood pressure  lying down 145/70, and sitting up 96.69. Heart rate 58 to 63. Saturation  96% on room air.  GENERAL:  A very pleasant, obese, white man in no distress, resting  comfortably in the bed.  HEENT:  He is blind with no light perception in both eyes. Face is  symmetric with normal strength. Speech is normal.  NECK:  Supple. Veins are flat. No carotid bruits.  LUNGS:  Clear to auscultation bilaterally without wheezing or rales.  CARDIAC:  Bradycardiac rate. Regular rhythm. No murmurs.  ABDOMEN:  Morbidly obese, soft, nontender, nondistended.  EXTREMITIES:  Trace chronic brawny edema. Part of the right foot is  surgically missing. Peripheral pulses and capillary refill are slightly  diminished.   LABORATORY DATA:  Electrocardiogram shows sinus bradycardia at 55 beats  per minute with first degree AV block, non-specific T wave changes and  positive U waves.   Sodium 144, potassium 3.9, BUN 57, creatinine 1.9, glucose 136,  hemoglobin 11.2.   IMPRESSION:  1. I was called to [facility] for repeated episodes      of bradycardia on the telemetry monitor. The monitor was      alarming for heart rates in the 20's and 30's. However, after      careful review of the telemetry tracings, it is clear that his      heart rate actually never goes below 50 beats per minute, but      rather the telemetry is under-sensing his R-waves. He does have      very low voltage, probably secondary to his body habitus. We      adjusted the gain on telemetry and things appear to be  better now.      The patient is resting comfortably in bed and has remained      asymptomatic.  2. Clearly, there is evidence of orthostatic hypotension and he is      being appropriately hydrated.  3. Please call us back if you have any further questions.      Aurea Graff, MD  Electronically Signed     NDL/MEDQ  D:  03/10/2007  T:  03/10/2007  Job:  [id number]

## 2010-08-17 NOTE — Assessment & Plan Note (Signed)
OFFICE VISIT   LEV, CIOLEK  DOB:  1950/09/20                                       03/04/2010  [id number]   The patient returns for followup today.  He was last seen on November 17  and had some erythema around his recently placed fistula.  He was placed  on doxycycline at that time.  He returns today for further followup.   On physical exam today the erythema has now completely resolved.  There  is an easily palpable thrill.  The fistula is visible all the way up to  the level of the elbow.  His incision is well-healed at this point.  The  fistula has healed completely.  There is an easily palpable bruit and  thrill.  This should be a good fistula for him long-term as it continues  to mature.  He will follow up with [place] on an as-needed basis.     Jessy Oto. Fields, MD  Electronically Signed   CEF/MEDQ  D:  03/04/2010  T:  03/05/2010  Job:  [id number]   cc:   Donato Heinz, M.D.

## 2010-08-17 NOTE — Assessment & Plan Note (Signed)
OFFICE VISIT   [name], [name]  DOB:  May 09, 1950                                       02/18/2010  [id number]   The patient was seen today as an add-on patient.  There were concerns  that he may have infection of a new AV fistula site on the left arm.  Dr. Scot Dock placed a left radiocephalic AV fistula in him on the  February 15, 2010.  His wife noticed that he has had some increasing  erythema around the area of the incision.  The patient does have a past  medical history of MRSA.   On physical exam, blood pressure is 161/70, heart rate is 56 and  regular.  There is mild erythema and some ecchymosis around the  incisions in forearm and left arm.  There is an audible bruit and  palpable thrill in the fistula.  There is no significant drainage  although apparently there was some drainage at the dialysis center  earlier today.   I believe most likely this represents postoperative trauma with some  ecchymosis and bruising.  However, due to the patient's previous MRSA  history and concerns from the family, I placed him on doxycycline today  100 mg twice a day #30 dispensed.  If the wound continues to  deteriorate, they will return for follow up sooner.  Otherwise they will  return for follow-up in two weeks' time to see Dr. Scot Dock for further  evaluation.     Jessy Oto. Fields, MD  Electronically Signed   CEF/MEDQ  D:  02/18/2010  T:  02/19/2010  Job:  [id number]   cc:   Judeth Cornfield. Scot Dock, M.D.  [facility]

## 2010-08-17 NOTE — H&P (Signed)
[name], CRAFT               ACCOUNT NO.:  [id number]   MEDICAL RECORD NO.:  [id number]          PATIENT TYPE:  OBV   LOCATION:  [id number]                         FACILITY:  [facility]   PHYSICIAN:  Sheila Oats, M.D.DATE OF BIRTH:  [birth year]   DATE OF ADMISSION:  03/09/2007  DATE OF DISCHARGE:  03/10/2007                              HISTORY & PHYSICAL   CHIEF COMPLAINT:  Low blood pressure in the office.   HISTORY OF PRESENT ILLNESS:  The patient is a 60 year old white male  with history significant for non-obstructive CAD, diabetes, status post  bilateral eye inoculation/blindness, peripheral vascular disease and  status post partial foot amputation, dyslipidemia, history of elevated  PSA, chronic kidney disease, anemia, on Procrit shots received at Dr.  Elissa Hefty office, who presents with above complaints. His wife is at  the bedside assisting with the history. They report that he was at Dr.  Elissa Hefty office for his Procrit shot when after standing up, he  stated that he felt very strange (as if he was in a different room from  the other people that he knew were right next to him.) When he  complained about this to the nursing staff at the office, his blood  pressure was checked and per his wife, his systolic was in the [id number] and  he was not give his Procrit shot for the day and they were asked to come  to the emergency room for possible IV hydration and further evaluation.  Per his wife, the patient did not fall and he did not lose  consciousness. The patient denies chest pain, shortness of breath,  palpitations, fevers, and no nausea and vomiting. He states that on the  day prior to this presentation, he had about 3 diarrheal stools over a  12 hour period but that ended 24 hours prior to presentation. He had not  had any further diarrhea. Per his wife, no melena or hematochezia. Upon  arrival in the emergency room, his blood pressure was 140/74. O2 sat of  96%. His  hemoglobin 11.2 and his creatinine 1.89 (last creatinine per  office note on January 30, 2007 was 1.62. He is admitted for hydration  and further observation.   PAST MEDICAL HISTORY:  1. As stated above.  2. Also history of diabetic retinopathy, neuropathy, and nephropathy.  3. History of depression.   MEDICATIONS:  1. Lantus 48 units q.h.s.  2. Lexapro 10 mg daily.  3. Humalog.  4. Aspirin 81 mg daily.  5. Carvedilol 12.5 mg b.i.d.  6. Finasteride 5 mg daily.  7. Ramipril 2.5 mg daily - just started this week per nephrology but      asked to hold off of the office when noted to be hypotensive.  8. Lasix 80 mg p.o. b.i.d.  9. Procrit shots at Dr. Elissa Hefty office.  10.Simvastatin 40 mg daily.   ALLERGIES:  CODEINE.   SOCIAL HISTORY:  The patient denies tobacco and alcohol.   FAMILY HISTORY:  Noncontributory.   REVIEW OF SYSTEMS:  As per HPI. Other review of systems negative.   PHYSICAL EXAMINATION:  GENERAL:  The patient is a pleasant middle aged  white male, in no apparent distress.  HEENT:  He is blind. Normocephalic and atraumatic. Slight dry mucous  membranes. No oral exudates.  VITAL SIGNS:  Temperature 96.8, blood pressure 140/74 with a pulse of  58, respiratory rate 20, O2 sat of 96%.  NECK:  Supple. No adenopathy, no thyromegaly, and no JVD.  LUNGS:  Clear to auscultation bilaterally. No crackles or wheezes.  CARDIOVASCULAR:  Regular rate and rhythm. Normal S1 and S2.  ABDOMEN:  Soft. Bowel sounds present. Nontender, nondistended, no  organomegaly. No masses palpable.  EXTREMITIES:  Una boots on. No lower leg cyanosis or edema.   LABORATORY DATA:  White cell count is 7.4. Hemoglobin and hematocrit  11.2 and 32.5. Platelet count 158,000. Sodium 144, potassium 3.9,  chloride 106, CO2 of 30. Glucose 136, BUN 57, creatinine 1.89.   ASSESSMENT/PLAN:  1. DIARRHEA:  Now resolved, probable gastroenteritis. Will obtain      stool studies, if any further episodes.  Hold off Lasix, and hydrate      with IV fluids. Follow-up.  2. ? HYPOTENSION:  At the office. Will obtain orthostatic vital signs.      His blood pressure 140/74 on arrival in the emergency room as      above. Likely secondary to hypovolemia and medications, recently      started on Ramipril as above.  3. ACUTE ON CHRONIC KIDNEY DISEASE:  Likely pre-renal as above. Hold      off Lasix, Ramipril, hydrate.  4. DIABETES MELLITUS:  Monitor Accu-checks. Continue Lantus, with      sliding scale insulin.  5. HISTORY OF CORONARY ARTERY DISEASE:  Obtain an EKG. The patient is      chest pain free. Continue his outpatient medications.  6. HISTORY OF ANEMIA:  Hemoglobin and hematocrit stable. Likely      secondary to his chronic kidney disease and he received Procrit in      the nephrology office.  7. DEPRESSION:  Continue outpatient medications.      Sheila Oats, M.D.  Electronically Signed     ACV/MEDQ  D:  03/11/2007  T:  03/11/2007  Job:  [id number]   cc:   Donato Heinz, M.D.  Fax: [phone]

## 2010-08-17 NOTE — Assessment & Plan Note (Signed)
[name]                            CARDIOLOGY OFFICE NOTE   NAME:[name], Nicholas MORGANO                      MRN:          [id number]  DATE:09/01/2006                            DOB:          10-27-1950    The patient is self-referred today after a four year absence from the  practice.   [name] is a delightful, yet very unfortunate, 60 year old married  white male who comes with his wife today. He has a history of coronary  artery disease by catheterization in 1997. At that time, he had a 30-50%  mid LAD beyond a second diagonal, and normal left ventricular function.  He has been treated medically.   I have not seen him in four years.   Last week, he had an episode where he was walking down the hall and got  weak and disoriented. He was not responding appropriate to his wife. He  denied any chest discomfort and subsequently recovered from this.   He has recently been evaluated by Dr. Bradd Burner with blood work. He had a  potassium of 6.1 on 08/20/06 per his wife. He was on potassium and it was  discontinued. Follow up potassium was the same and his creatinine was up  to 2.5 from 1.6. He is on Kayexalate now and has some follow up blood  work this coming Tuesday.   He is fairly inactive. He has no symptoms of angina or ischemia at  present. He has no orthopnea or PND. He has some peripheral edema.   PAST MEDICAL HISTORY:  He is intolerant of codeine.   CURRENT MEDICATIONS:  1. Aspirin 81 mg daily.  2. Multivitamin.  3. Catapres patch TTS 3 weekly.  4. Amlodipine 10 mg daily.  5. Lisinopril 40 mg daily.  6. Carvedilol 12.5 mg b.i.d.  7. Furosemide 40 mg daily.  8. Sulfamethoxazole 2 daily.  9. Simvastatin 40 mg daily.  10.Lexapro 10 mg daily.  11.Kayexalate 4 times daily.  12.Lantus as prescribed.   His other medical problems include severe diabetic retinopathy status  post right eye enucleation. He has had a history of malignant  hypertension, type-2 diabetes, chronic anemia, and depression. He also  suffers from diabetic neuropathy and now nephropathy.   He does not smoke. He does not drink or use recreational drugs.   In addition to eye enucleation, he is now had vein stripping in both  legs. He has a cholecystectomy. He has had removal of part of his right  foot.   FAMILY HISTORY:  Really non-contributory.   SOCIAL HISTORY:  He is not employed. He is totally blind with bilateral  enucleations. He is married and his wife is extremely faithful and is  with him today. He has two children.   REVIEW OF SYSTEMS:  Other than the HPI, he has a history of allergies,  hay fever, and chronic fatigue.   PHYSICAL EXAMINATION:  GENERAL:  He is sitting in a wheelchair. He is  blind.  VITAL SIGNS:  Blood pressure 155/79, pulse 62. His EKG shows sinus  rhythm with first degree AV with no change  from his past studies. He has  poor R-wave progression across the intra-precordium. He is 6 foot 4  inches and weighs 330 pounds.  HEENT:  Normocephalic except for his eyes. He has a ruddy complexion.  Facial symmetry is normal.  NECK:  Supple, carotids are full without bruits, there is no JVD,  thyroid is not enlarged, trachea is midline.  LUNGS:  Clear.  HEART:  Reveals a poorly appreciated PMI. He has a soft S1, S2, without  gallop.  ABDOMEN:  Protuberant with good bowel sounds.  EXTREMITIES:  Reveal 1+ edema, pulses were present, but reduced,  posterior tibial.  NEUROLOGIC:  He is in a wheelchair. He is totally blind.  SKIN:  Shows some ecchymosis, otherwise benign.   ASSESSMENT AND PLAN:  Mr. Milia has so many medical problems that it  is difficult to even get a grasp of them after a 4 1/2 year absence from  our practice. I have given his wife the following information:  I think  that his heart is doing well at the present time. He had a 2D  echocardiogram on November 23, 2005 which showed normal left ventricular   systolic function, no important valvular problems with mild left atrial  enlargement. He has a history of coronary artery disease dating back to  1997 which hopefully has not changed significantly, but most likely has.  He is certainly asymptomatic, but he is very inactive.   His other problems need more urgent attention including his hyperkalemia  and his renal insufficiency. I have asked her to follow up with his  blood work next Tuesday and to follow up as scheduled with nephrology  next Friday.   We will arrange for a stress nuclear study at some point in the near  future to rule out obstructive coronary disease. I will leave his  hypertension management to Dr. Bradd Burner and nephrology.     Thomas C. Verl Blalock, MD, [facility]  Electronically Signed    TCW/MedQ  DD: 09/01/2006  DT: 09/02/2006  Job #: [id number]   cc:   Janifer Adie, M.D.  [facility]

## 2010-08-20 NOTE — Discharge Summary (Signed)
[facility]  Patient:    [name], [name] Visit Number: [id number] MRN: [id number]          Service Type: MED Location: [phone] Attending Physician:  Horatio Pel Dictated by:  Lynnell Chad. Shelia Media, M.D. Admit Date:  12/21/2000 Discharge Date: 12/27/2000   CC:         Foot Clinic   Discharge Summary  LABORATORY RESULTS:  Initial white count 13.0, at discharge 4.9.  Initial hemoglobin13.6, by discharge 11.2 with an MCV of 83.  Platelet count 178. Initialleft shift.  Initial glucose 504.  Sodium 138, potassium 3.5. Potassiumwent as low as 2.5 but was 3.9 by the time of discharge.  BUN 25, creatinine 0.9 initially, by discharge 12/0.6.  Albumin was low, as low as 2.6. Hepatic panel was normal except for a slightly elevated total bilirubin initially.  Total cholesterol 146 with LDL of 71, HDL of 47.  Microalbumin was elevated at 16.9.  Urinalysis showed greater than 1000 glucose.  Blood culture negative x 2.  Urine culture 40,000 group B Step.  C. difficile toxin negative.  RADIOLOGY RESULTS:  Mild enlargement of lymph node of left groin, varicose veins left calf, nonthrombosed, no DVT.  [id number] sinus rhythm, nonspecific T-wave abnormality.  No old tracings.  HOSPITAL COURSE:  Please see admission history and physical for details of [name] presentation.  Briefly, he presented with fever and progressive redness and swelling of the left lower leg.  DVT was ruled outand he was treated with antibiotics for cellulitis and initially put on the Glucommander. He gradually defervesced.  He had problems with nausea and vomiting through the rest of the hospital stay but felt well by the time of discharge.  He was felt to be probably depressed and started on Zoloft.  Hypokalemia was treated appropriately.  IMPRESSION: 1. Cellulitis of leg. 2. Diabetes mellitus type 2 out of control. 3. Probable depression. 4. Medical noncompliance, had not  been medications. 5. Intolerance GLUCOPHAGE--diarrhea. 6. Hypokalemia--treated. 7. Low serum albumin. 8. Obesity.  DISPOSITION:  He is discharged home.  DISCHARGE MEDICATIONS: 1. Aspirin 81 mg daily. 2. Actos30 mg each morning. 3. Amaryl 4 mg each morning. 4. Augmentin 875 mg twice a day through September 30. 5. Wellbutrin SR 150 mg p.o. daily for three days and then twice a day.  DISCHARGE INSTRUCTIONS:  He is to check his CBGs four times a day and return to see me in two weeks.  Follow up at diabetic clinic at [facility] and also at the foot clinic at [facility] for his heel ulcer on Monday, January 01, 2001.  CONDITION:  Improved. Dictated by:   Lynnell Chad. Shelia Media, M.D. Attending Physician:  Horatio Pel DD:  12/30/00 TD:  12/30/00 Job: [id number]

## 2010-08-20 NOTE — Op Note (Signed)
   TNAMENIKET, TRIGGS                        ACCOUNT NO.:  [id number]   MEDICAL RECORD NO.:  [id number]                   PATIENT TYPE:  AMB   LOCATION:  ENDO                                 FACILITY:  [facility]   PHYSICIAN:  Waverly Ferrari, M.D.                 DATE OF BIRTH:  14-Oct-1950   DATE OF PROCEDURE:  11/21/2001  DATE OF DISCHARGE:                                 OPERATIVE REPORT   PROCEDURE:  Colonoscopy, with polypectomy and biopsy.   INDICATIONS:  Gonorrhea, colon polyps.   ANESTHESIA:  Demerol 20 mg, Versed 2 mg.   DESCRIPTION OF PROCEDURE:  With the patient mildly sedated in the left  lateral decubitus position, the Olympus videoscopic colonoscope was inserted  into the rectum  and passed under direct vision to the cecum -- identified  by the ileocecal valve and appendiceal orifice, both of which are  photographed.  We entered into the terminal ileum, which also appeared  normal.  From this point the colonoscope was slowly withdrawn, taking  circumferential views of the remaining colonic mucosa and stopping it only  in the cecum -- where a poly was seen, photographed, and removed using snare  cautery technique with setting of 20/20 blended current.  The tissue was  retrieved .  The rectum appeared normal in direct and showed some smaller  hemorrhoids in retroflex view.  The colonoscope was straightened and  withdrawn.  The patient's vital signs and pulse oximetry remained stable.  The patient tolerated the procedure well and all without apparent  complications.   FINDINGS:  Polyp in the cecum; removed.  Random colon biopsies were taken,  of normal-appearing mucosa.  Internal hemorrhoids.   PLAN:  Await biopsy report.  The patient will call me for results and follow  up with me as an outpatient.                                               Waverly Ferrari, M.D.    GMO/MEDQ  D:  11/21/2001  T:  11/21/2001  Job:  [id number]

## 2010-08-20 NOTE — H&P (Signed)
[facility]  Patient:    [name], [name] Visit Number: [id number] MRN: [id number]          Service Type: EMS Location: ED Attending Physician:  Tor Netters Dictated by:   Jene Every, M.D. Admit Date:  12/21/2000   CC:         Lynnell Chad. Shelia Media, M.D.   History and Physical  ADMITTING DIAGNOSES: 1. Diabetes. 2. Stasis ulcers. 3. Cellulitis. 4. Nausea and vomiting.  HISTORY OF PRESENT ILLNESS:  This 60 year old white male presented to the [facility] Emergency Department after one day of nausea, vomiting and diaphoresis.  The patient reported left lower leg pain.  The patient states that he had some chills and then developed a fever.  The patient noticed some reddening of his leg and felt that he had cellulitis.  He has had similar bouts in the past secondary to what he describes as vascular insufficiency. The patient also has a "blister" on his heel, which he has had over one months, which has not been healing.  The patient denies any trauma to the area.  The patient denies any other lesions.  PAST MEDICAL HISTORY: 1. Diabetes mellitus. 2. History of cholecystectomy.  MEDICATIONS:  The patient takes an aspirin.  The patient is supposed to be on Glucophage, unknown doseage.  However, he has not taken this for three months.  ALLERGIES:  Codeine, which caused the patient to "past out."  FAMILY HISTORY:  Significant for mother having cancer of the breasts and ovaries, father having emphysema and questionable kidney carcinoma.  He had a brother who had coronary artery disease before the age of 48 and another brother who had coronary artery disease in his mid 39s.  SOCIAL HISTORY:  The patient is married with two children.  No tobacco.  No alcohol.  He lives alone with his wife.  REVIEW OF SYSTEMS:  As above.  HEENT: The patient denies any blurred vision. GENERAL:  The patient denies any weight gain or fatigue.  He has had  some polydipsia.  CARDIOVASCULAR: The patient denies any chest pain.  RESPIRATORY: The patient denies any breathing problems.  GENITOURINARY: The patient admits to increased urinary frequency.  OBJECTIVE DATA:  VITAL SIGNS:  Temperature 99.8, pulse 90, blood pressure 137/60, respirations 20.  GENERAL:  Large man in no obvious distress lying comfortably on a [place].  HEENT:  Normocephalic.  No papal edema.  TMs clear.  Oropharynx with pink and moist mucosa.  NECK:  Supple.  No thyromegaly.  No bruits.  No adenopathy.  HEART:  Regular rhythm.  No murmurs, rubs, or gallops.  LUNGS:  Clear.  No rales.  No wheezes.  ABDOMEN:  Soft and nontender.  Positive bowel sounds.  EXTREMITIES:  No cyanosis or clubbing.  Trace edema bilaterally to the midtibia.  SKIN:  The patient has an approximate 2 x 2 inch grade 2 ulcer on his left heel.  The patient also has some erythema of the left lateral leg.  ASSESSMENT AND PLAN: 1. Stasis ulcer.  After evaluation, this is probably the most serious of the    patients problems.  Will get a skin care consult and start the patient on    IV antibiotics.  The patient may actually need surgical therapy if this    does not heal.  I did check for pulses on physical examination, and I was    not very impressed with his pedal or posterior tibial pulses.  However, the  patient did have some edema, which made this difficult to accurately    assess. 2. Diabetes.  The patients diabetes is very poorly controlled.  The patient    has not been taking his medications for several months.  The patient will    be placed on sliding scale insulin and we will resume his Glucophage.  The    patient does not appear to be in hyperosmolic nonketotic syndrome or    ketoacidosis, which would not be expected given the fact that he is a    type 2.  The patient will be given IV hydration and subcu insulin as    needed to hopefully get his sugars at a more reasonable level.  The  patient    seems to be fairly stable at this point in time.  I think his initial    symptoms of nausea, vomiting and diaphoresis were all the result of the    combination of his poorly-controlled diabetes and his infection. Dictated by:   Jene Every, M.D. Attending Physician:  Tor Netters DD:  12/22/00 TD:  12/22/00 Job: [id number]

## 2010-08-20 NOTE — Op Note (Signed)
NAME:  [name], [name]                         ACCOUNT NO.:  [id number]   MEDICAL RECORD NO.:  [id number]                   PATIENT TYPE:  REC   LOCATION:  FOOT                                 FACILITY:  [place]   PHYSICIAN:  Garey Ham, M.D.             DATE OF BIRTH:  [birth year]   DATE OF PROCEDURE:  05/03/2002  DATE OF DISCHARGE:                                 OPERATIVE REPORT   PREOPERATIVE DIAGNOSIS:  Advanced proliferative diabetic retinopathy with  recurrent vitreous hemorrhage left eye, status post previous pars plana  vitrectomy left eye with endolaser photocoagulation 12/05/01.   POSTOPERATIVE DIAGNOSIS:  Advanced proliferative diabetic retinopathy with  recurrent vitreous hemorrhage left yee, status post previous pars plana  vitrectomy left eye with endolaser photocoagulation 12/05/01.   NAME OF OPERATION:  Pars plana vitrectomy using vitreous infusion suction  cutter, endolaser photocoagulation.   SURGEON:  Garey Ham, M.D.   ASSISTANT:  Nurse.   ANESTHESIA:  General.   OPTHALMOSCOPY:  No preoperative view due to dense vitreous hemorrhage.   OPERATIVE PROCEDURE:  After the patient was prepped and draped, the lid  speculum was inserted in the left eye.  The eye was turned downward and  superior rectus traction suture placed.  A peritomy was performed adjacent  to the limbus from the 9:30 to 4 o'clock position. Three sclerotomy sites  were prepared, 3.5 mm from the limbus at the 10, 2 and 4 o'clock position  using the MVR blade.  The 4 mm vitreous infusion terminal was secured in  place at the 4 o'clock position with a 5-0 white mattress dacron suture.  Due to the dense vitreous hemorrhage the tip could not be visualized.  The  fiberoptic light pipe was inserted at the 2 o'clock position and Handpiece  vitreous infusion suction cutter at the 10 o'clock position.  Slow vitreous  infusion suction cutting were begun from an anterior to posterior direction.  There were considerable blood attached to the posterior surface of the lens,  making visualization difficult.  Some of this was aspirated and the  procedure continued with slow aspiration of the diffuse vitreous hemorrhage.  Scleral depression was also used inferiorly to aspirate the blood clots  which were adherent to the peripheral retina.  Gradually the vitreous  cleared enough to visualize the retina which appeared to be attached.  The  optic nerve appeared pale and the blood vessels slightly attenuated.  There  were pools of preretinal blood which were aspirated with the handpiece of  the vitreous infusion suction cutter and the dutch tip brush aspirator.  The  previous laser photocoagulation was seen.  There appeared to be a preretinal  fibrosis or membranes along the temporal blood vessels, these appeared to be  extremely adherent and it was felt risky to manipulate these fearing  possible retinal tearing.  The macular area appeared to be clear of these  membranes.  It was therefore elected to assemble the endolaser  photocoagulator and an additional [id number] applications were made in the  peripheral retina.  The fundus was inspected with indirect ophthalmoscopy  and scleral depression and there appeared to be no peripheral retinal tears  or detachment area seen.  It was then elected to close.  The sclerotomy  sites were closed with 7-0 interrupted Vicryl sutures.  The conjunctivae was  pulled forward with a running 7-0 Vicryl suture.  A small abrasion or  epithelial deterioration due to his due to his weak endothelium was noted  inferiorly.  Maxitrol ointment and Atropine ointment were instilled in the  conjunctival cul-de-sac.  The irrigating contact lens was used throughout  the procedure, and the cornea was kept hydrated throughout the procedure  with the irrigation fluid under the contact lens.  A light patch and  protector shield were applied after dexamethasone and garamycin had  been  injected in the  subtenon space and Maxitrol and Atropine ointment applied to the  conjunctival cul-de-sac.  Duration of procedure 1-1/2 hours.  The patient  tolerated the procedure well in general, left the operating room for the  recovery room in good condition.                                               Garey Ham, M.D.    HNJ/MEDQ  D:  05/03/2002  T:  05/03/2002  Job:  [id number]

## 2010-08-20 NOTE — Discharge Summary (Signed)
[name], [name]               ACCOUNT NO.:  [id number]   MEDICAL RECORD NO.:  [id number]          PATIENT TYPE:  INP   LOCATION:  5150                         FACILITY:  [place]   PHYSICIAN:  Corinna L. Conley Canal, MDDATE OF BIRTH:  Aug 08, 1950   DATE OF ADMISSION:  11/22/2005  DATE OF DISCHARGE:  11/26/2005                                 DISCHARGE SUMMARY   DISCHARGE DIAGNOSES:  1. Vomiting.  2. Malignant hypertension.  3. Edema.  4. Suspected obstructive sleep apnea, consider outpatient polysomnogram.  5. Recent right eye enucleation.  6. Diabetes.  7. Renal insufficiency.  8. Resolved hypernatremia.  9. Anemia.  10.History of diabetic retinopathy, nephropathy and neuropathy.  11.History of previous left eye enucleation.  12.Depression.   DISCHARGE MEDICATIONS:  1. Lasix 40 mg a day.  2. Potassium 20 mEq daily.  3. His Toprol has been increased to 100 mg a day.  4. Lisinopril increased to 20 mg a day.  5. Continue multivitamin.  6. Aspirin 81 mg a day.  7. Lexapro 10 mg a day.  8. Glimepiride 4 mg a day.  9. Simvastatin 40 mg a day.  10.Norvasc 10 mg a day.  11.Catapres patch TTS-3 weekly.  12.He also is currently on TobraDex ointment to the right eye four times      daily.   Follow up with Dr. Zadie Rhine on Monday.  Follow up with Dr. Bradd Burner next week  for blood pressure check and basic metabolic panel.   CONSULTATIONS:  Dr. Zadie Rhine and Dr. Haroldine Laws   PROCEDURES:  PICC line.   CONDITION:  Stable.   ACTIVITY:  He uses a walker.   DIET:  Low salt, diabetic.   PERTINENT LABORATORIES:  On admission, his CBC was significant for a  hemoglobin of 11, hematocrit 32, sodium 159, potassium 3.9, chloride 110,  bicarbonate 20, glucose 168, BUN 25, creatinine 1.5.  At discharge, his  sodium had normalized and his creatinine was 1.7, cardiac enzymes negative,  TSH 1.877.  UA showed a specific gravity of greater than 1.030, large  hemoglobin, small bilirubin, greater than  300 protein, negative nitrate and  negative leukocyte esterase.  Blood cultures have been negative.   SPECIAL STUDIES AND RADIOLOGY:  Chest x-ray showed bibasilar atelectasis.  CT of the brain showed chronic ischemic changes, atrophy, soft tissue  swelling over the right orbit, no evidence of periorbital cellulitis.  Echocardiogram was a difficult study, but showed an ejection fraction of  about 50% to 55%, mildly-to-moderately increased left ventricular wall  thickness, mildly dilated left atrium.  EKG showed normal sinus rhythm.   HISTORY AND HOSPITAL COURSE:  [name] is a pleasant, 60 year old, white  male patient of Dr. Bradd Burner with multiple medical problems, who had  undergone an enucleation of the right eye on August 20, due to severe  diabetic retinopathy and pain.  He began having intractable vomiting and  severe pain.  He was brought to the emergency room and found to have a blood  pressure of 192/87, otherwise normal vital signs.  He also had worsening  edema in his hands and feet over  the past several weeks to months.  There  was concern that he might have an infection causing the vomiting, and he was  admitted to step-down.  He was started initially empirically on antibiotics.  CAT scan was done, and patient was seen by ophthalmology.  Dr. Zadie Rhine did  not feel that this represented an infection and that it was all  postoperative changes.  He did recommend TobraDex ointment q.i.d. and gauze  over the area to catch any drainage.  Patient was initially thought to be in  right heart failure.  An echo was done, which showed fairly well preserved  ejection fraction, but it had poor acoustic windows.  Dr. Haroldine Laws was  consulted and felt that patient's edema was most likely multifactorial,  secondary to probable untreated obstructive sleep apnea and obesity  hypoventilation syndrome with possibly some venous insufficiency.  There was  no evidence of left heart failure.  Patient  was started on IV diuretics, and  his blood pressure medications were adjusted upwards.  He was also given IV  Reglan and IV fluids, as well as Iv pain medications.  At this time, his  diet has been advanced, and he has had no vomiting for several days.  His  pain is much better, as well.  He required a PICC line due to poor IV  access.  His antibiotics have been stopped, as there is no sign of  infection.  His blood pressure has still been relatively high, but this may  be somewhat pain related.  I have asked that he follow up closely with Dr.  Bradd Burner to check the blood pressure and the BMET.  Total time on the day of  discharge is 45 minutes.      Corinna L. Conley Canal, MD  Electronically Signed     CLS/MEDQ  D:  11/26/2005  T:  11/26/2005  Job:  [id number]

## 2010-08-20 NOTE — Op Note (Signed)
NAME:  [name], [name]                         ACCOUNT NO.:  [id number]   MEDICAL RECORD NO.:  [id number]                   PATIENT TYPE:  OIB   LOCATION:  2550                                 FACILITY:  [place]   PHYSICIAN:  Garey Ham, M.D.             DATE OF BIRTH:  10/14/1950   DATE OF PROCEDURE:  12/04/2001  DATE OF DISCHARGE:                                 OPERATIVE REPORT   PREOPERATIVE DIAGNOSES:  1. Chronic and recurrent vitreous hemorrhage left eye.  2. Proliferative diabetic retinopathy.   POSTOPERATIVE DIAGNOSES:  1. Chronic and recurrent vitreous hemorrhage left eye.  2. Proliferative diabetic retinopathy.   OPERATION:  Pars plana vitrectomy using vitreous infusion suction cutter,  endolaser panretinal photocoagulation, preretinal membrane peeling and  excision.   SURGEON:  Garey Ham, M.D.   ASSESSMENT:  Nurse.   ANESTHESIA:  General anesthesia.   OPHTHALMOSCOPY:  As previous described.   DESCRIPTION OF PROCEDURE:  After the patient was prepped and draped, a lid  speculum was inserted in the left eye.  The eye was turned downward and a  superior rectus traction suture placed.  A peritomy was performed adjacent  to the limbus from the 9:30 to 4 o'clock position superiorly. The  subconjunctival tissue was cleaned and three sclerotomy sites repaired at  the 10, 2 and 4 o'clock position using the MVR blade.  The 4 mm vitreous  infusion terminal was secured in place at the 4 o'clock position with a 5-0  white mattress Dacron suture. The tip of the same projecting into the  vitreous cavity. The fiberoptic light plate was inserted at the 10 o'clock  position and the fiberoptic light pipe was inserted at the 2 o'clock  position and a hand piece of the vitreous infusion suction cutter at the 10  o'clock position.  Slow vitreous infusion suction cutting were begun from an  anterior to posterior direction toward the retina. Dense vitreous hemorrhage  was  encountered as the retina was approached, there appeared to be  proliferative diabetic retinopathy especially along the inferior temporal  retinal blood vessel with a proliferative stalk in this area.  A preretinal  membrane covered the entire posterior pole.  The Michael's pick was used to  gradually tease the membrane and peel it from the retinal surface in several  locations.  It was then excised and cut with a vitreous infusion suction  cutter.  Once again, the Michael's pick was reintroduced and further  membrane peeling achieved.  There was considerable preretinal hemorrhage  trapped by this preretinal membrane.  This was gradually aspirated with the  [place] brush tip aspirator.  There were several small points of proliferation  in several locations holding the retina to the preretinal membrane.  There  was some minor bleeding from these sites.  Following removal of the surface  blood, the endolaser photocoagulator was assembled and 1043 burns achieved  using  a scattered technique over the posterior retina outside the vascular  arcades.  Good laser burn reaction was achieved.  Indirect ophthalmoscopy  and scleral depression was used to check the peripheral fundus revealing no  retinal tears or detachment areas.  It was then elected to close.  The  sclerotomy sites were closed with interrupted 7-0 Vicryl sutures.  The  conjunctiva was then pulled forward and closed with a running 7-0 Vicryl  suture.  Depo, Garamycin and Celestone were injected in the subtenon space  inferiorly.  A light patch and vitrectomy shield along with Maxitrol and  atropine was applied.   DURATION OF PROCEDURE:  One and one half hours.   DISPOSITION:  The patient tolerated the procedure well in general and left  the operating room for the recovery room in good condition.                                               Garey Ham, M.D.    HNJ/MEDQ  D:  12/04/2001  T:  12/04/2001  Job:  [id number]

## 2010-08-20 NOTE — H&P (Signed)
NAME:  [name], [name]                         ACCOUNT NO.:  [id number]   MEDICAL RECORD NO.:  [id number]                   PATIENT TYPE:  REC   LOCATION:  FOOT                                 FACILITY:  [facility]   PHYSICIAN:  Garey Ham, M.D.             DATE OF BIRTH:  1950/04/07   DATE OF ADMISSION:  04/26/2002  DATE OF DISCHARGE:                                HISTORY & PHYSICAL   OUTPATIENT NOTE:  This was a planned outpatient readmission of this 60-year-  old white male, non-insulin dependent diabetic, admitted for additional  vitreal retinal surgery of the left eye.   HISTORY OF PRESENT ILLNESS:  This patient has a long history of non-insulin  dependent diabetes mellitus, poorly controlled with secondary complications  of diabetic retinopathy in both eyes.  The patient had developed  proliferative diabetic retinopathy in the left eye with vitreous hemorrhage  and was previously admitted on 12/04/01 for a posterior vitrectomy surgery.  The patient did well at surgery and was discharged the following day.  He  returned to the office with vision 20/70, suddenly he developed a  postoperative hemorrhage and vision was reduced to light perception.  This  has not cleared and the patient has elected to proceed with additional  surgery.  He signed an informed consent and arrangements made for his  outpatient readmission at this time.   PAST MEDICAL HISTORY:  The patient continues under the care of Dr. Marcello Moores C.  Wall, and Dr. Lynnell Chad. Pharr, for his general health.   MEDICATIONS:  The patient takes multiple medications including Lipitor,  Toprol, Amaryl.  Previously took Actos, potassium, Demodex, aspirin 81 mg.   ALLERGIES:  CODEINE.   PHYSICAL EXAMINATION:  VITAL SIGNS:  Blood pressure 133/78, respirations 18,  pulse 64, temperature 96.7.  GENERAL APPEARANCE:  The patient is a large, well-nourished, well-developed  male in no acute distress.  HEENT:  Visual acuity less  than 20/50 right eye, light perception left eye.  Applanation tonometry 14 mm right eye, 12 mm left eye.  External ocular and  slit-lamp examination; the eyes are white and clear with a clear cornea,  deep and clear anterior chamber, the lens is relatively clear.  FUNDUS:  A detailed fundus examination reveals proliferative diabetic  retinopathy of the right eye with panretinal laser photocoagulation and some  gaps.  The left eye shows a dense, diffuse hemorrhage, and no details are  seen.  B-scan ultrasonography shows dense vitreous hemorrhage, but no  retinal detachment.  CHEST:  Lungs clear to percussion and auscultation.  HEART:  Normal sinus rhythm, no cardiomegaly, no murmurs.  ABDOMEN:  Negative.  EXTREMITIES:  Negative.   ADMISSION DIAGNOSIS:  Recurrent vitreous hemorrhage left eye secondary to  proliferative diabetic retinopathy.   SURGERY PLANNED:  Pars plana vitrectomy with additional endolaser  photocoagulation.  Garey Ham, M.D.   HNJ/MEDQ  D:  05/03/2002  T:  05/03/2002  Job:  [id number]

## 2010-08-20 NOTE — H&P (Signed)
[name], SCHIERMAN               ACCOUNT NO.:  [id number]   MEDICAL RECORD NO.:  [id number]          PATIENT TYPE:  INP   LOCATION:  5507                         FACILITY:  [facility]   PHYSICIAN:  [name], M.D.DATE OF BIRTH:  1951/03/01   DATE OF ADMISSION:  08/11/2004  DATE OF DISCHARGE:                                HISTORY & PHYSICAL   PRIMARY CARE PHYSICIAN:  [name], M.D.   CHIEF COMPLAINT:  Cough and fevers.   HISTORY OF PRESENT ILLNESS:  The patient is a 60 year old white male with a  past medical history significant for diabetes mellitus, hypertension,  blindness, status post amputation of right forefoot, who presents with  complaints of chest congestion and a nonproductive cough times about one  week and also fevers x 2 days.  He was seen by his primary care physician,  Dr. Bradd Burner, and given IM Rocephin at the office and then prescribed  Levaquin and he came back for a followup visit today but was still febrile  and also complained of nausea, vomiting, and so he was directly admitted to  the medical floor for IV antibiotics and further evaluation and management.  The patient also reports generalized soreness and denies any trauma.  He  also denies chest pain, PND, orthopnea, melena, hematochezia.  The wife  reports that she has noted some slight swelling in his lower extremities and  the patient states that he feels somewhat short of breath when he is  coughing.  He denies any history of tobacco use, although he reports  exposure to second hand smoke.  He denies polyuria polydipsia.   PAST MEDICAL HISTORY:  1.  As stated above.  2.  History of diabetic retinopathy in both eyes.  3.  History of iritis.   MEDICATIONS:  1.  Lantus 15 units q.p.m.  2.  Toprol XL 50 mg every day.  3.  Norvasc 10 mg p.o. every day.  4.  Lexapro 10 mg p.o. every day.  5.  Lipitor 20 mg p.o. every day.  6.  Amaryl 4 mg p.o. every day.  7.  Catapres-TTS-3 patch every  week.  8.  Alphagan.  9.  Pred Forte 1%.  10. Lisinopril 10 mg every day.  11. Alcon.  12. Tessalon Perles 200 q.8h.   ALLERGIES:  1.  HYDROCODONE.  2.  CODEINE.   SOCIAL HISTORY:  He denies tobacco, also denies alcohol use.   FAMILY HISTORY:  His father died of kidney cancer and emphysema.  His mother  died of breast cancer.  His brothers have heart disease and hypertension.   REVIEW OF SYSTEMS:  As per HPI, other comprehensive review of systems  negative.   PHYSICAL EXAMINATION:  GENERAL:  The patient is a morbidly obese, middle-  aged, white male in no acute respiratory distress.  VITAL SIGNS:  His temperature is 98.4, pulse is 60, respiratory rate is 20,  blood pressure is 150/66, O2 sat 94% on room air.  HEENT:  The patient is blind.  Slightly dry mucous membranes.  No oral  exudates.  NECK:  Supple.  No adenopathy.  No JVD.  No thyromegaly.  LUNGS:  Diffuse rhonchi.  No crackles appreciated and no wheezes.  CARDIOVASCULAR:  Regular rate and rhythm.  Normal S1 S2.  ABDOMEN:  Obese.  Bowel sounds present.  Nontender, nondistended, no  organomegaly, no masses palpable.  EXTREMITIES:  Trace edema.  No cyanosis.  Status post right forefoot  amputation.  NEURO:  Awake, oriented x 3.  Strength 5/5.  Nonfocal exam.   LABORATORY DATA:  The sodium is 140, potassium 4.8, his chloride is 113, CO2  is 23, BUN is 36, creatinine is 1.6, glucose is 117.  His CK total is 1654,  troponin is 0.02, and the chest x-ray and a CBC with diff are pending at  this time.  His brain natriuretic peptide is 171.   ASSESSMENT/PLAN:  1.  Cough/fever, probable pneumonia.  Follow and review chest x-ray.  Obtain      blood cultures.  If temperature equal to or greater than 100.4, empiric      antibiotics.  Symptomatic management, antitussives and nebulizers.  2.  Diabetes mellitus.  We will monitor Accu-Chek.  Resume oral      hypoglycemics and Lantus also add a sliding scale coverage.  3.   Rhabdomyolysis.  Saline diuresis with IV fluids, monitor, and recheck      CK.  We will hold Lipitor for now and we will also check a urinalysis.  4.  Hypertension.  We will continue outpatient antihypertensives with hold      parameters and monitor blood pressures.  5.  Peripheral edema, mild.  Brain natriuretic peptide just slightly      elevated.  We will obtain a 2D echo to evaluate cardiac function and      follow.      ACV/MEDQ  D:  08/11/2004  T:  08/11/2004  Job:  [id number]   cc:   [name], M.D.  [place]  [place] [address]  Fax: [phone]

## 2010-08-20 NOTE — Consult Note (Signed)
NAMEQUITMAN, GRENIER               ACCOUNT NO.:  [id number]   MEDICAL RECORD NO.:  [id number]          PATIENT TYPE:  INP   LOCATION:  2918                         FACILITY:  [facility]   PHYSICIAN:  Shaune Pascal. Bensimhon, MDDATE OF BIRTH:  02/11/1951   DATE OF CONSULTATION:  11/23/2005  DATE OF DISCHARGE:                                   CONSULTATION   REFERRING PHYSICIAN:  Ashby Dawes. Polite, M.D.   REASON FOR CONSULTATION:  Chronic lower extremity edema.   HISTORY OF PRESENT ILLNESS:  Mr. [name] is a 60 year old male with multiple  medical problems including nonobstructive coronary artery disease, chronic  lower extremity edema with ulceration, morbid obesity, diabetes with  significant complications, and hypertension.  He underwent nucleation of his  right eye on the 20th, due to advanced diabetic retinopathy.  He was  readmitted today with significant pain and a headache as well as emesis and  poorly controlled blood pressure.  He has been giving him pain medicines and  we are asked to see him regarding his lower extremity edema.   He tells me that he has had a long history of lower extremity edema and has  had problems with ulceration in the past.  He is status post bilateral vein  stripping.  He denies any symptoms of left-sided heart failure.  He has not  had any chest pain.  He does have mild dyspnea on exertion, but no orthopnea  or PND.   REVIEW OF SYSTEMS:  Notable for low-grade fevers, problems with his vision.  No palpitation, syncope, or presyncope.  No claudication, no coughing.  He  has had nausea and vomiting as well as a severe headache.  No other focal  neurologic problems.  Positive for snoring, but denies witnessed apnea.  All  other systems are negative except for history in problem list.   PROBLEM LIST:  1. Nonobstructive coronary artery disease.      a.     Status post cardiac catheterization by Dr. Glade Lloyd about 5       years ago with a 70% LAD lesion by  report.  2. Morbid obesity.  3. Chronic lower extremity edema.      a.     Status post bilateral vein stripping due to venous       insufficiency.      b.     Echocardiogram in may 2006 showed an EF of 55% to 65% with       normal diastolic function.  The RV was moderately dilated with       moderately depressed RV function, mild TR, and a peak RV systolic       pressure of about 44 mmHg, consistent with pulmonary hypertension.  4. Diabetes complicated by retinopathy, nephropathy, and neuropathy.  5. Hyperlipidemia.  6. Varicose veins status post vein stripping.  7. History of gallstones status post cholecystectomy.  8. Chronic renal insufficiency with baseline creatinine at, what appears      to be, about 1.5.  9. Depression.   HOME MEDICATIONS:  Centrum silver, aspirin 81 a day, Toprol XL 50 a  day,  Lexapro 10 a day, glimepiride 4 a day, simvastatin 40 a day, lisinopril 10 a  day, Norvasc 10 a day, Catapres patch 3 a day, and Lantus insulin.   SOCIAL HISTORY:  He lives in [place] with his wife.  He is an unemployed  Furniture conservator/restorer.  He denies any tobacco, alcohol, or drug use.   FAMILY HISTORY:  Her mother died at age 49 due to cancer.  Father died at 75  from cancer.  No history of CAD.  There is coronary artery disease in his  siblings.   PHYSICAL EXAMINATION:  GENERAL:  He is a morbidly obese man who appears  chronically ill.  VITAL SIGNS:  Blood pressure 163/64, heart rate 60, he is sating 100% on 2  liters, his temperature 99.1.  HEENT:  His right eye is bandaged.  Mucous membranes are moist.  NECK:  Supple and thick.  JVD appears moderately elevated, though it is hard  to see completely.  Carotids are 2+ bilaterally without any appreciable  bruits.  CARDIAC:  He has regular rate and rhythm with distant heart sounds.  I do  hear a soft systolic murmur at the left sternal border.  No S3 or S4  appreciated.  LUNGS:  Clear anteriorly.  ABDOMEN:  Soft, obese, nontender,  nondistended with hypoactive bowel sounds.  EXTREMITIES:  Warm with no cyanosis or clubbing.  He has 2+ to 3+ pitting  edema bilaterally with chronic venous stasis changes.  NEUROLOGIC:  He is alert and oriented x3.  Moves all 4 extremities without  any difficulty.  Affect is flat.   EKG shows normal sinus rhythm with a first degree AV block with nonspecific  T-wave changes, rate 71.  Chest x-ray shows mild bibasilar atelectasis.   LABORATORY DATA:  White count 7.1, hemoglobin 11.2, platelets 151,000.  Sodium 147, potassium 4.4, chloride of 115, bicarb 27, BUN 25, creatinine  1.5.  CK is 241, MB is 1.9.   ASSESSMENT AND PLAN:  Chronic severe lower extremity edema.  This is likely  multifactorial, but he does seem to have a significant amount of RV  dysfunction on his previous echo and thus there is probably a component of  right-sided heart failure here, which is suspect is due to untreated  obstructive sleep apnea and possible obesity hypoventilation syndrome.  I  think venous insufficiency is also playing a significant role.  There is no  evidence of significant left-side heart failure or ongoing angina.   RECOMMENDATIONS:  1. IV diuresis.  2. Support hose.  3. Check echocardiogram to follow up on RV function and pulmonary      pressures.  4. Put him on overnight pulse oximetry to evaluate for desaturation.  He      will need outpatient sleep study.  5. Aggressive treatment of his blood pressure.  6. Check BNP.      Shaune Pascal. Bensimhon, MD  Electronically Signed     DRB/MEDQ  D:  11/23/2005  T:  11/24/2005  Job:  [id number]   cc:   Thomas C. Verl Blalock, MD, [facility]  Janifer Adie, M.D.

## 2010-08-20 NOTE — H&P (Signed)
[name], [name]               ACCOUNT NO.:  [id number]   MEDICAL RECORD NO.:  [id number]          PATIENT TYPE:  INP   LOCATION:  1845                         FACILITY:  [facility]   PHYSICIAN:  Ashby Dawes. Polite, M.D. DATE OF BIRTH:  [birth year]   DATE OF ADMISSION:  11/22/2005  DATE OF DISCHARGE:                                HISTORY & PHYSICAL   CHIEF COMPLAINT:  Nausea and vomiting.   HISTORY OF PRESENT ILLNESS:  This 60 year old male with multiple medical  problems went to the ED after approximately 12-18 hours of nausea and  vomiting.  According to the wife, the patient had been complaining of pain  post recent surgery, right eye enucleation.  She gave him OxyContin that she  had at home.  Shortly thereafter, the patient had repeated bouts of emesis.  Denied any fever or chills. No chest pain, no shortness of breath.  The  patient did eat a chicken pie from [employer]; however, other family  members ate the same thing without any problems.  Also of note, the  patient's blood pressure has been elevated.  Because of that, they brought  the patient to the ED.   In the ED, the patient was evaluated and found to be hypertensive at 192/87,  pulse 96, respiratory rate 22, O2 saturation 97%.  The patient has a BMET  significant for hypernatremia at 159, glucose 168, creatinine 1.5.  CBC  without leukocytosis.  Chest x-ray: Low lung volumes, mild cardiomegaly.  Because of persistent emesis and elevated blood pressure, [facility]  were called for further evaluation and admission.  At the time of my  evaluation, the patient is lying in bed in no apparent distress, states that  he feels better; however, shortly after that had an episode of nausea and  vomiting.  Of note, the patient denies any new medications.  Again denies  fever or chills, denies any dysuria.  On further discussion with his wife,  he did not appear to have any postop complications related to his recent eye  surgery.  Because of the above issues, admission is deemed necessary for  further evaluation and treatment.   PAST MEDICAL HISTORY:  Significant for:  1. Postop day #1, right eye enucleation.  2. Diabetes with questionable history of gastroparesis in the past.  3. Hypertension.  4. High cholesterol.  5. Depression.  6. Morbid obesity.   The patient's wife denies MI, CVA, denies any lung disease, denies any  thyroid disease.   MEDICATIONS ON ADMISSION:  1. Centrum Silver.  2. Aspirin 81 mg daily.  3. Toprol XL 50 mg daily.  4. Lexapro 10 mg daily.  5. Glimepiride 4 mg daily.  6. Simvastatin 40 mg daily.  7. Lisinopril 10 mg daily.  8. Norvasc 10 mg daily.  9. Catapres TTS 3.  10.Lantus 8 units at 8 p.m.   SOCIAL HISTORY:  Negative for tobacco, alcohol, or drugs.   PAST SURGICAL HISTORY:  1. Left eye removed approximately 4 years ago.  2. Recent right eye enucleation, postop day #1.  3. Right forefoot amputation secondary to  diabetic complication.  4. Positive for cholecystectomy in the past.  5. The patient has a cardiac catheterization in the past.   ALLERGIES:  Reports allergy to CODEINE.   FAMILY HISTORY:  Mother with metastatic breast cancer as well as ovarian  cancer.  Father with renal cell cancer and COPD.  One sister with breast  cancer and two brothers with coronary artery disease.   REVIEW OF SYSTEMS:  As stated in HPI.   PHYSICAL EXAMINATION:  GENERAL:  Morbidly obese male.  VITAL SIGNS:  Blood pressure 192/87, pulse 96, respiratory rate 22, O2  saturation 97%.  HEENT: The patient has gauze packing over his right eye.  He has moist oral  mucosa.  The patient does have rosacea on his face.  NECK:  Supple, no adenopathy.  CHEST: Moderate air movement bilaterally without rales or rhonchi.  CARDIOVASCULAR: Regular, no S3.  ABDOMEN:  Morbidly obese.  EXTREMITIES: 2+ edema.  Right forefoot amputation.  No active wounds.  Left  foot, no wounds, 1+ pulse.   NEUROLOGIC: Grossly intact.   DATA:  BMET: Sodium 159, potassium 3.9, chloride 110, BUN 25, glucose 168,  creatinine 1.5. CBC: White count 7.1, hemoglobin 11.2, hematocrit 32.8, MCV  87, platelets 151, neutrophil count 89%.   Chest x-ray: Low lung volumes which causes crowding of the pulmonary  vasculature.  There is mild cardiomegaly.  No definite air space opacity.  Limited exam.   ASSESSMENT:  1. Nausea and vomiting x12 hours.  Differential diagnoses include occult      infection versus gastroparesis versus gastroenteritis versus central      cause.  2. Status post right eye enucleation, postoperative day #1.  3. Diabetes.  4. Hypertensive urgency.  Please note: Patient's Catapres is 1 day      overdue.  Cannot rule out rebound hypertension.  5. Peripheral vascular disease status post right forefoot amputation.  6. High cholesterol.  7. Obesity.   RECOMMENDATIONS:  1. Patient be admitted to a step-down unit.  2. Will obtain a CT of his head as patient recently 1 day postop to rule      out any postop complications or signs of infection. We will call      ophthalmology to assist with postop care.  3. Start the patient on IV fluids, antiemetics.  4. Culture to rule out occult infection.  5. Treat blood pressure with IV medications as needed.  6. Obtain followup chest x-ray to rule out aspiration pneumonitis.  7. DVT prophylaxis as well as GI prophylaxis, and will started empiric      antibiotics until occult infection is excluded.  8. Will make further recommendations as deemed necessary.      Ashby Dawes. Polite, M.D.  Electronically Signed     RDP/MEDQ  D:  11/22/2005  T:  11/22/2005  Job:  [id number]

## 2010-08-20 NOTE — Op Note (Signed)
   [name], [name]                        ACCOUNT NO.:  [id number]   MEDICAL RECORD NO.:  [id number]                   PATIENT TYPE:  AMB   LOCATION:  ENDO                                 FACILITY:  [facility]   PHYSICIAN:  Waverly Ferrari, M.D.                 DATE OF BIRTH:  Nov 28, 1950   DATE OF PROCEDURE:  11/21/2001  DATE OF DISCHARGE:                                 OPERATIVE REPORT   PROCEDURE:  Upper endoscopy.   ANESTHESIA:  Demerol 70 mg, Versed 8 mg.   DESCRIPTION OF PROCEDURE:  With the patient mildly sedated in the left  lateral decubitus position, the Olympus videoscopic endoscope was inserted  into the mouth and passed under direct vision through the esophagus; which  appeared normal into the stomach.  The fundus, body, antrum, duodenal bulb,  second portion of the duodenum were all visualized and appeared normal.  From this point the endoscope was slowly withdrawn, taking circumferential  views of the entire duodenal mucosa, until the endoscope then pulled back  and the stomach placed in retroflexion to view the stomach from below.  The  endoscope was then straightened and withdrawn, taking circumferential views  of the remaining gastric and esophageal mucosa.  The patient's vital signs  and pulse oximetry remained stable.  The patient tolerated the procedure  well and all without apparent complications.   FINDINGS:  Normal examination.   PLAN:  Proceed to colonoscopy.                                               Waverly Ferrari, M.D.    GMO/MEDQ  D:  11/21/2001  T:  11/21/2001  Job:  [id number]

## 2010-08-20 NOTE — H&P (Signed)
NAME:  [name], [name]                         ACCOUNT NO.:  [id number]   MEDICAL RECORD NO.:  [id number]                   PATIENT TYPE:  OIB   LOCATION:  2550                                 FACILITY:  [facility]   PHYSICIAN:  Garey Ham, M.D.             DATE OF BIRTH:  1950/04/27   DATE OF ADMISSION:  12/04/2001  DATE OF DISCHARGE:                                HISTORY & PHYSICAL   REASON FOR ADMISSION:  This was a planned admission of this non-insulin  dependent diabetic admitted for vitreoretinal surgery of the left eye for  chronic and recurrent vitreous hemorrhage and proliferative diabetic  retinopathy.   HISTORY OF PRESENT ILLNESS:  This patient has a long history of non-insulin-  dependent diabetes mellitus and more recent gradual onset of diabetic  progressing to proliferative diabetic retinopathy with chronic clinically  significant macular edema.  The patient initially was seen on October 22, 2001,  complaining of slow progressive loss of vision in both eyes.  Examination at  that time, revealed marked free proliferative diabetic retinopathy with  scattered blot and dot hemorrhages, infraretinal vascular defects and  macular edema.  Later, the patient developed vitreous hemorrhage in the left  eye reducing his vision to less than 2200.  It was felt that the patient  warranted vitrectomy surgery with endolaser photocoagulation and possible  membrane peeling.  The patient was given oral discussion and printed  information concerning the procedure and its complications.  He signed an  informed consent and arrangements made for his outpatient admission at this  time.   PAST MEDICAL HISTORY:  The patient is under the care of Lynnell Chad. Shelia Media,  M.D.  The patient currently takes multiple medications for blood sugar  control including Amaryl, Actos, and Glucophage.  The patient does not check  his blood sugars regularly and does not watch his diet.  His current weight  is  over 300 pounds.   REVIEW OF SYSTEMS:  No specific cardiac or lung complaints.   PHYSICAL EXAMINATION:  GENERAL APPEARANCE:  The patient is a husky, well-  developed, well-nourished white male in acute ocular distress.  VITAL SIGNS:  As recorded on admission, blood pressure 140/89, respiratory  rate 18, heart rate 62, temperature 98.3.  HEENT:  Visual acuity last recorded preoperatively at 20/200 right eye, less  than 20/400 left eye.  Applanation tonometry is 16 mm right eye, 14 left  eye.  External ocular and slit lamp examination normal.  Detailed fundus  examination with biomicroscopy and indirect ophthalmoscopy with a dilated  pupil reveals in the right eye a clear vitreus, attached  retina.  The optic  nerve is sharply outlined and good color.  Disc/cup ratio 0.4.  There is  preproliferative diabetic retinopathy with scattered blot hemorrhages over  the entire posterior pole and probable cystoid clinically significant  macular edema.  The left eye has a dense vitreous hemorrhage and  no retinal  details are seen.  LUNGS:  Clear to auscultation and percussion.  CARDIOVASCULAR:  Normal sinus rhythm, no cardiomegaly, no murmurs.  ABDOMEN:  Negative.  EXTREMITIES:  Negative.   ADMISSION DIAGNOSES:  1. Non-insulin-dependent diabetes mellitus.  2. Vitreous hemorrhage left eye.  3. Proliferative diabetic retinopathy both eyes.   SURGICAL PLAN:  Pars plana vitrectomy with endolaser photocoagulation and  possible membrane peeling left eye now, right eye later.                                                Garey Ham, M.D.    HNJ/MEDQ  D:  12/04/2001  T:  12/04/2001  Job:  [id number]   cc:   Lynnell Chad. Shelia Media, M.D.  Fax: [phone]

## 2010-08-20 NOTE — Consult Note (Signed)
[facility]  Patient:    [name], [name] Visit Number: [id number] MRN: [id number]          Service Type: FTC Location: FOOT Attending Physician:  Danny Lawless Dictated by:   Orlando Penner London Pepper, M.D. Proc. Date: 01/01/01 Admit Date:  01/01/2001   CC:         Lynnell Chad. Shelia Media, M.D.   Consultation Report  HISTORY OF PRESENT ILLNESS:  This 60 year old white male was seen at the courtesy of Dr. Shelia Media for assistance with management of an ulcer on the left heel.  The patient apparently wore some new shoes to an out of town baseball game some six weeks ago, and with these rubbed a blistering ulcer on the area of his left heel.  He ignored this, did not take any particular treatment for it, and then suddenly developed an apparent cellulitis in that extremity which led to his hospitalization 10 days ago.  He was treated apparently for about five days with intravenous antibiotics, and then released on Augmentin 875 mg b.i.d.  He has been cleansing the area at home daily with hydrogen peroxide and placing no dressing.  He has not experienced any improvement in the lesion, and accordingly is referred here for our further consultation.  PAST MEDICAL HISTORY: 1. Type 2 diabetes, which which he apparently is not particularly compliant. 2. Deep venous disease in his lower extremities and some stasis changes that    are chronic in nature.  ALLERGIES:  CODEINE.  REGULAR MEDICATIONS: 1. Augmentin as indicated above. 2. Amaryl 4 mg b.i.d. 3. Actos 15 mg b.i.d. 4. Wellbutrin dose uncertain. 5. Aspirin 81 mg q.d.  PHYSICAL EXAMINATION:  Examination today is limited to the distal lower extremities.  The patients feet are chronically edematous, and there is evidence for chronic venous stasis changes in the pretibial and foot areas bilaterally.  Skin temperatures are somewhat diminished, but reasonably symmetrical.  He has several small calluses and corns on  his feet, but none that need immediate treatment.  His pulses are diminished, but palpable in the dorsalis pedis positions, and audible by Doppler in the posterior tibial positions.  Monofilament testing is intact on the arches of the foot, but not elsewhere.  On the posterior aspect of the left heel is an ulcer measuring 14 x 25 mm with some stepped callus of the margins.  The wound base is clean, but quite dry.  DISPOSITION: 1. The patient is given general instruction regarding foot care by video with    nurse and physician reinforcement. 2. The margins of the ulcer, as previously described, are superficially    debrided, and indeed it looks much better. 3. The patient is to cover this with Duoderm, and to change it on an every    three day basis. 4. He is also to be fitted with a sandal, with an open heel and no strap that    crosses the area of concern.  For this, he will need a note for his work,    where he is otherwise required to wear special steel toed shoes. 5. He is to continue the Augmentin until it is completed.  FOLLOWUP:  His follow-up visit here will be in some two weeks. Dictated by:   Orlando Penner London Pepper, M.D. Attending Physician:  Danny Lawless DD:  01/01/01 TD:  01/01/01 Job: [id number]

## 2010-08-20 NOTE — Op Note (Signed)
[name], [name]               ACCOUNT NO.:  [id number]   MEDICAL RECORD NO.:  [id number]          PATIENT TYPE:  AMB   LOCATION:  SDS                          FACILITY:  [facility]   PHYSICIAN:  Clent Demark. Rankin, M.D.   DATE OF BIRTH:  08/16/1950   DATE OF PROCEDURE:  11/21/2005  DATE OF DISCHARGE:                                 OPERATIVE REPORT   PREOPERATIVE DIAGNOSES:  1. Blind painful eye, right eye--status post advanced diabetic retinopathy      and multiple surgical repairs done elsewhere.  2. Complex total retinal detachment of vitreous chamber, right eye, with      no hope for visual acuity or salvage, with severe impact on the      activities of daily living because of the severity of his painful eye.   PROCEDURE:  Evisceration with placement of intrascleral implant.   SURGEON:  Clent Demark. Rankin, M.D.   ANESTHESIA:  Local retrobulbar anesthesia control.   INDICATIONS FOR PROCEDURE:  The patient is a 60 year old man who has had  multiple procedures elsewhere on the basis of advanced progressive  proliferative diabetic retinopathy and diabetic retinopathy.  The patient  understands this is an attempt to remove the blind painful eye and the  contents thereof.  He also wishes to preserve as much motility as possible.  For this reason, we are planning on performing evisceration with the removal  of intraocular contents and placement of silicone implant so as to allow for  maintenance of ocular motility.  The patient understands the risks of  anesthesia and __________  death, also including but not limited to  hemorrhage, infection, scarring, need for further surgery, no change in  vision, loss of vision, progressive disease, need for further intervention.  Informed signed consent was obtained, and the patient was taken to the  operating room.   PROCEDURE IN DETAIL:  In the operating room, appropriate monitors followed  by mild sedation with 0.5% Marcaine in a retrobulbar with  an additional 5 mL  by modified Kirk Ruths. Right periocular region was sterilely prepped and  draped in an ophthalmic fashion.  A lid speculum was applied.  The patient  was still tender in the globe and periocular region.  He was tender to touch  at the conjunctiva.  Tetracaine was applied.  Subconjunctival Xylocaine 1%  was applied in each quadrant in the sub Tenon fascia to facilitate the  dissection.  Conjunctiva was fashioned 360 degrees.  The quadrants were  entered so as to mobilize the conjunctiva and Tenon.  At this time, the  corneoscleral junction was identified.  A 15-gauge __________  was then used  to remove the anterior corneal cap, and this was done without difficulty.  The intraocular contents came forth, including an old crenated cataractous  lens, with total retinal detachment.  Uveal tissue removed.  Hemostasis was  obtained initially with Surgicel.   At this time, absolute alcohol was then used to denature the remaining  remnants of the uveal contents in the scleral pocket.  A small amount of  Surgicel was then placed over  where the optic nerve entered so as to confirm  and to maintain hemostasis.  A 14-mm implant was selected after the 18 mm  was found to be too large, and a 16 mm was not available.  The 14-mm implant  was then placed.  This placement in the scleral pocket allowed to close the  scleral wound.  Approximately 15, 5-0 Mersilene interrupted sutures were  placed.  Tenon was then closed with 4-0 and 5-0 Vicryl at deep Tenon  overlying the suture line of the sclera.  Thereafter, 4-0 conjunctival  closure was then carried out as well to close the conjunctiva.   The __________  surface was irrigated with __________.  Sterile patch was  applied.  Thereafter, Kerlix was fluffed and Elastoplast was then used to  secure the Kerlix for moderately compressive bandage to maintain hemostasis.   At this time, the patient was taken to PACU in good and stable  condition.  The patient tolerated the procedure well without complication.      Clent Demark Rankin, M.D.  Electronically Signed     GAR/MEDQ  D:  11/21/2005  T:  11/22/2005  Job:  [id number]

## 2010-10-13 ENCOUNTER — Other Ambulatory Visit: Payer: Self-pay | Admitting: Interventional Radiology

## 2010-10-15 ENCOUNTER — Ambulatory Visit (HOSPITAL_COMMUNITY)
Admission: RE | Admit: 2010-10-15 | Discharge: 2010-10-15 | Disposition: A | Discharge status: Home / Self Care | Payer: 59 | Source: Ambulatory Visit | Attending: Interventional Radiology | Admitting: Interventional Radiology

## 2010-10-15 DIAGNOSIS — E119 Type 2 diabetes mellitus without complications: Secondary | ICD-10-CM | POA: Insufficient documentation

## 2010-10-15 DIAGNOSIS — C649 Malignant neoplasm of unspecified kidney, except renal pelvis: Secondary | ICD-10-CM | POA: Insufficient documentation

## 2010-10-29 ENCOUNTER — Inpatient Hospital Stay (HOSPITAL_COMMUNITY): Payer: 59

## 2010-10-29 ENCOUNTER — Inpatient Hospital Stay (HOSPITAL_COMMUNITY)
Admission: AD | Admit: 2010-10-29 | Discharge: 2010-11-03 | DRG: 683 | Disposition: A | Discharge status: Home / Self Care | Payer: 59 | Source: Ambulatory Visit | Attending: Nephrology | Admitting: Nephrology

## 2010-10-29 DIAGNOSIS — I2510 Atherosclerotic heart disease of native coronary artery without angina pectoris: Secondary | ICD-10-CM | POA: Diagnosis present

## 2010-10-29 DIAGNOSIS — E119 Type 2 diabetes mellitus without complications: Secondary | ICD-10-CM | POA: Diagnosis present

## 2010-10-29 DIAGNOSIS — Z8546 Personal history of malignant neoplasm of prostate: Secondary | ICD-10-CM

## 2010-10-29 DIAGNOSIS — Z85528 Personal history of other malignant neoplasm of kidney: Secondary | ICD-10-CM

## 2010-10-29 DIAGNOSIS — I120 Hypertensive chronic kidney disease with stage 5 chronic kidney disease or end stage renal disease: Secondary | ICD-10-CM | POA: Diagnosis present

## 2010-10-29 DIAGNOSIS — N2581 Secondary hyperparathyroidism of renal origin: Secondary | ICD-10-CM | POA: Diagnosis present

## 2010-10-29 DIAGNOSIS — H543 Unqualified visual loss, both eyes: Secondary | ICD-10-CM | POA: Diagnosis present

## 2010-10-29 DIAGNOSIS — N186 End stage renal disease: Principal | ICD-10-CM | POA: Diagnosis present

## 2010-10-29 DIAGNOSIS — E872 Acidosis, unspecified: Secondary | ICD-10-CM | POA: Diagnosis present

## 2010-10-29 DIAGNOSIS — S98919A Complete traumatic amputation of unspecified foot, level unspecified, initial encounter: Secondary | ICD-10-CM

## 2010-10-29 DIAGNOSIS — E785 Hyperlipidemia, unspecified: Secondary | ICD-10-CM | POA: Diagnosis present

## 2010-10-29 DIAGNOSIS — K219 Gastro-esophageal reflux disease without esophagitis: Secondary | ICD-10-CM | POA: Diagnosis present

## 2010-10-29 DIAGNOSIS — D631 Anemia in chronic kidney disease: Secondary | ICD-10-CM | POA: Diagnosis present

## 2010-10-29 DIAGNOSIS — I739 Peripheral vascular disease, unspecified: Secondary | ICD-10-CM | POA: Diagnosis present

## 2010-10-29 LAB — COMPREHENSIVE METABOLIC PANEL
ALT: 29 U/L (ref 0–53)
AST: 14 U/L (ref 0–37)
Alkaline Phosphatase: 80 U/L (ref 39–117)
CO2: 16 mEq/L — ABNORMAL LOW (ref 19–32)
CO2: 14 mEq/L — ABNORMAL LOW (ref 19–32)
Calcium: 8 mg/dL — ABNORMAL LOW (ref 8.4–10.5)
Calcium: 7.8 mg/dL — ABNORMAL LOW (ref 8.4–10.5)
Creatinine, Ser: 7.18 mg/dL — ABNORMAL HIGH (ref 0.50–1.35)
GFR calc Af Amer: 9 mL/min — ABNORMAL LOW (ref >60)
GFR calc non Af Amer: 8 mL/min — ABNORMAL LOW (ref >60)
GFR calc non Af Amer: 8 mL/min — ABNORMAL LOW (ref >60)
Glucose, Bld: 129 mg/dL — ABNORMAL HIGH (ref 70–99)
Glucose, Bld: 142 mg/dL — ABNORMAL HIGH (ref 70–99)
Potassium: 4.4 mEq/L (ref 3.5–5.1)
Sodium: 144 mEq/L (ref 135–145)
Total Protein: 5.4 g/dL — ABNORMAL LOW (ref 6.0–8.3)
Total Protein: 5.3 g/dL — ABNORMAL LOW (ref 6.0–8.3)

## 2010-10-29 LAB — CBC
Hemoglobin: 8.9 g/dL — ABNORMAL LOW (ref 13.0–17.0)
Hemoglobin: 8.7 g/dL — ABNORMAL LOW (ref 13.0–17.0)
MCH: 32 pg (ref 26.0–34.0)
MCH: 31.5 pg (ref 26.0–34.0)
MCHC: 34.5 g/dL (ref 30.0–36.0)
MCV: 91.3 fL (ref 78.0–100.0)
Platelets: 72 10*3/uL — ABNORMAL LOW (ref 150–400)
RBC: 2.78 MIL/uL — ABNORMAL LOW (ref 4.22–5.81)
RBC: 2.76 MIL/uL — ABNORMAL LOW (ref 4.22–5.81)
WBC: 3.2 10*3/uL — ABNORMAL LOW (ref 4.0–10.5)

## 2010-10-29 LAB — PHOSPHORUS: Phosphorus: 8.5 mg/dL — ABNORMAL HIGH (ref 2.3–4.6)

## 2010-10-29 LAB — MRSA PCR SCREENING: MRSA by PCR: NEGATIVE

## 2010-10-29 LAB — GLUCOSE, CAPILLARY: Glucose-Capillary: 136 mg/dL — ABNORMAL HIGH (ref 70–99)

## 2010-10-30 ENCOUNTER — Inpatient Hospital Stay (HOSPITAL_COMMUNITY): Payer: 59

## 2010-10-30 LAB — RENAL FUNCTION PANEL
Albumin: 2.8 g/dL — ABNORMAL LOW (ref 3.5–5.2)
Albumin: 3 g/dL — ABNORMAL LOW (ref 3.5–5.2)
CO2: 19 mEq/L (ref 19–32)
Calcium: 7.9 mg/dL — ABNORMAL LOW (ref 8.4–10.5)
Chloride: 109 mEq/L (ref 96–112)
Creatinine, Ser: 6.46 mg/dL — ABNORMAL HIGH (ref 0.50–1.35)
GFR calc Af Amer: 11 mL/min — ABNORMAL LOW (ref >60)
GFR calc Af Amer: 15 mL/min — ABNORMAL LOW (ref >60)
GFR calc non Af Amer: 9 mL/min — ABNORMAL LOW (ref >60)
Glucose, Bld: 143 mg/dL — ABNORMAL HIGH (ref 70–99)
Phosphorus: 4.2 mg/dL (ref 2.3–4.6)
Potassium: 3.7 mEq/L (ref 3.5–5.1)
Potassium: 3.4 mEq/L — ABNORMAL LOW (ref 3.5–5.1)
Sodium: 142 mEq/L (ref 135–145)
Sodium: 138 mEq/L (ref 135–145)

## 2010-10-30 LAB — CBC
MCV: 90.4 fL (ref 78.0–100.0)
Platelets: 83 10*3/uL — ABNORMAL LOW (ref 150–400)
RBC: 2.72 MIL/uL — ABNORMAL LOW (ref 4.22–5.81)
RDW: 13.9 % (ref 11.5–15.5)
WBC: 3.3 10*3/uL — ABNORMAL LOW (ref 4.0–10.5)

## 2010-10-30 LAB — HEPATITIS B CORE ANTIBODY, TOTAL: Hep B Core Total Ab: NEGATIVE

## 2010-10-30 LAB — DIFFERENTIAL
Basophils Absolute: 0 10*3/uL (ref 0.0–0.1)
Basophils Relative: 0 % (ref 0–1)
Eosinophils Absolute: 0.1 10*3/uL (ref 0.0–0.7)
Eosinophils Relative: 4 % (ref 0–5)
Lymphs Abs: 0.4 10*3/uL — ABNORMAL LOW (ref 0.7–4.0)
Neutrophils Relative %: 74 % (ref 43–77)

## 2010-10-30 LAB — HEPATITIS B SURFACE ANTIGEN: Hepatitis B Surface Ag: NEGATIVE

## 2010-10-30 LAB — GLUCOSE, CAPILLARY
Glucose-Capillary: 153 mg/dL — ABNORMAL HIGH (ref 70–99)
Glucose-Capillary: 167 mg/dL — ABNORMAL HIGH (ref 70–99)

## 2010-10-30 LAB — HEMOGLOBIN A1C
Hgb A1c MFr Bld: 5.2 % (ref <5.7)
Mean Plasma Glucose: 103 mg/dL (ref <117)

## 2010-10-30 LAB — MAGNESIUM: Magnesium: 2 mg/dL (ref 1.5–2.5)

## 2010-10-31 LAB — CARDIAC PANEL(CRET KIN+CKTOT+MB+TROPI)
CK, MB: 2.9 ng/mL (ref 0.3–4.0)
Relative Index: 2.1 (ref 0.0–2.5)
Relative Index: 2 (ref 0.0–2.5)
Total CK: 145 U/L (ref 7–232)
Total CK: 142 U/L (ref 7–232)

## 2010-11-01 ENCOUNTER — Inpatient Hospital Stay (HOSPITAL_COMMUNITY): Payer: 59

## 2010-11-01 LAB — GLUCOSE, CAPILLARY
Glucose-Capillary: 105 mg/dL — ABNORMAL HIGH (ref 70–99)
Glucose-Capillary: 97 mg/dL (ref 70–99)

## 2010-11-01 LAB — RENAL FUNCTION PANEL
Albumin: 3 g/dL — ABNORMAL LOW (ref 3.5–5.2)
CO2: 21 mEq/L (ref 19–32)
Chloride: 107 mEq/L (ref 96–112)
GFR calc Af Amer: 12 mL/min — ABNORMAL LOW (ref >60)
GFR calc non Af Amer: 10 mL/min — ABNORMAL LOW (ref >60)
Potassium: 3.6 mEq/L (ref 3.5–5.1)

## 2010-11-01 LAB — CARDIAC PANEL(CRET KIN+CKTOT+MB+TROPI)
CK, MB: 2.7 ng/mL (ref 0.3–4.0)
Troponin I: <0.3 ng/mL (ref <0.30)

## 2010-11-01 LAB — CBC
Platelets: 94 10*3/uL — ABNORMAL LOW (ref 150–400)
RBC: 2.94 MIL/uL — ABNORMAL LOW (ref 4.22–5.81)
RDW: 13.7 % (ref 11.5–15.5)
WBC: 4.6 10*3/uL (ref 4.0–10.5)

## 2010-11-02 ENCOUNTER — Other Ambulatory Visit: Payer: 59

## 2010-11-02 LAB — GLUCOSE, CAPILLARY

## 2010-11-02 LAB — RENAL FUNCTION PANEL
BUN: 79 mg/dL — ABNORMAL HIGH (ref 6–23)
CO2: 26 mEq/L (ref 19–32)
Calcium: 8.4 mg/dL (ref 8.4–10.5)
Creatinine, Ser: 4.98 mg/dL — ABNORMAL HIGH (ref 0.50–1.35)
GFR calc Af Amer: 14 mL/min — ABNORMAL LOW (ref >60)
Glucose, Bld: 158 mg/dL — ABNORMAL HIGH (ref 70–99)

## 2010-11-03 ENCOUNTER — Inpatient Hospital Stay (HOSPITAL_COMMUNITY): Payer: 59

## 2010-11-03 LAB — GLUCOSE, CAPILLARY: Glucose-Capillary: 139 mg/dL — ABNORMAL HIGH (ref 70–99)

## 2010-11-03 LAB — RENAL FUNCTION PANEL
CO2: 22 mEq/L (ref 19–32)
Calcium: 8.9 mg/dL (ref 8.4–10.5)
Chloride: 104 mEq/L (ref 96–112)
GFR calc Af Amer: 12 mL/min — ABNORMAL LOW (ref >60)
Glucose, Bld: 116 mg/dL — ABNORMAL HIGH (ref 70–99)
Sodium: 139 mEq/L (ref 135–145)

## 2010-11-03 LAB — CBC
Hemoglobin: 9.1 g/dL — ABNORMAL LOW (ref 13.0–17.0)
MCH: 31.4 pg (ref 26.0–34.0)
MCV: 91.4 fL (ref 78.0–100.0)
RBC: 2.9 MIL/uL — ABNORMAL LOW (ref 4.22–5.81)
WBC: 4 10*3/uL (ref 4.0–10.5)

## 2010-11-04 NOTE — Discharge Summary (Addendum)
[name]               ACCOUNT NO.:  [id number]  MEDICAL RECORD NO.:  [id number]  LOCATION:  6712                         FACILITY:  [facility]  PHYSICIAN:  [name] L. Ashby Moskal, M.D.DATE OF BIRTH:  20-Aug-1950  DATE OF ADMISSION:  10/29/2010 DATE OF DISCHARGE:  11/03/2010                              DISCHARGE SUMMARY   DISCHARGE DIAGNOSES: 1. End-stage renal disease. 2. Secondary hyperparathyroidism. 3. Anemia. 4. Hypertension and coronary artery disease. 5. Diabetes mellitus. 6. History of prostate cancer and renal cell carcinoma.  CONSULTATIONS DURING HOSPITAL ADMISSION:  None.  HISTORY OF PRESENT ILLNESS:  [name] is a very pleasant 60 year old Caucasian male with a past medical history of CKD stage V followed by Dr. Marval Regal at [facility] since September 07, 2006, felt secondary to diabetic nephropathy and was last seen in the office, October 14, 2010 with a creatinine level of 5.37. On the date of admission, he presented to the office with decreased energy, nausea and vomiting and was noted to have a creatinine level of 6.74.  He was then directly admitted with uremia and with a preexisting functional  vascular access, hemodialysis was initiated on admission.  LABORATORY DATA:  At the time of admission, MRSA PCR screening negative. WBC 3.2, RBC 2.78, hemoglobin 8.9, hematocrit 25.6, platelet count 72. Phosphorus 8.5, sodium 144, potassium 4.4, chloride 112, CO2 16, glucose 129, BUN 188, creatinine 7.29, hemoglobin A1c 5.2%.  Hepatitis B surface antigen negative, hepatitis B surface antibody negative, hepatitis B core antibody negative.  DIABETIC EXAMINATION TEST AND PROCEDURES DURING HOSPITALIZATION:  None.  HOSPITAL COURSE: 1. End-stage renal disease. Progressive CKD with uremic symptoms and a pre-     hemodialysis, BUN 188 and creatinine of 7.29.  He underwent his     first hemodialysis treatment October 29, 2010 utilizing a left forearm     AV  fistula that was placed back on February 15, 2010 by Dr.     Scot Dock and mature and ready for use. He had undergone 4 hemodialysis     treatments at the time of this dictation. The last on this a.m.,     tolerating 3 hours and successful removal of 2 liters of fluid.     He had a little over 11 liters of fluid removed with these 4      hemodialysis treatments and his volume status improved significantly.     At the time of this dictation, his BUN was down to 93 and creatinine     level 6.02.  His initial metabolic acidosis was corrected with hemodialysis     and he no longer had any uremic symptoms. Following placement at     [facility] on [facility], he will continue hemodialysis     every Monday, Wednesday and Friday on second shift.  He will arrive at      11:15 on Friday this week for his first outpatient treatment and will     be dialyzed 3-1/2 hours. He will then be advanced to 4 hours with his     next treatment scheduled for that following Monday. 2. Secondary hyperparathyroidism.  He was on calcitriol 0.25 mcg every  Monday, Wednesday and Friday as well as PhosLo 667 mg one with each     meal and with an initial phosphorus level of 8.5 on admission, now     trending down to 6.4 this a.m.  We will continue his same dose of     PhosLo and his p.o. calcitriol has been discontinued with the     initiation of Zemplar 1 mcg every hemodialysis treatment.  He will     have repeat calcium, phosphorus and intact PTH levels in the     outpatient setting.  Of note, his most recent intact PTH was 83 in     the office on October 14, 2010. 3. Anemia.  He was previously on Procrit in the office as well as p.o.     iron supplementation.  While in the hospital, he received Aranesp     200 mcg IV on Friday, October 29, 2010 as well as Feraheme  510 mg IV     on October 29, 2010 and November 01, 2010.  In review of his most recent     iron studies on October 14, 2010 from the office, he had a  iron     saturation of 27%.  We will therefore continue Epogen at 20,000     units IV every treatment noting his most recent hemoglobin of 9.1     drawn this a.m. as well as initiating InFeD 50 mg every week     following 25 mg test dose.  His anemia status will be monitored     closely in the outpatient setting and medications adjusted     accordingly.   4.  Hypertension and coronary artery disease.  He has     been weaned off his p.o. Lasix utilizing ultrafiltration with     hemodialysis.  He will continue Coreg 6.25 mg twice a day and will     continue to be followed by his cardiologist, Dr. Jenell Milliner as     previously scheduled or on a as needed basis.  At the time of     discharge, his blood pressure was well controlled at 112/69.  Of     note, he also had negative cardiac enzymes drawn during this     hospitalization. 5. Diabetes.  His diabetes was diet controlled and during this     hospitalization, we utilized sensitive sliding-scale insulin     coverage.  At the time of this dictation, his CBG's ranged from 77-     238 and coverage was given as indicated.  He will resume a diet-     controlled regimen in the outpatient setting, and we will defer     additional diabetes management to his primary Dr. Lutricia Feil.  Of     note, he did have a hemoglobin A1c of 5.2% on October 29, 2010. 6. History of prostate cancer and renal cell carcinoma.  He is status     post radio ablation regarding his renal cell carcinoma and had a     MRI approximately 3-4 weeks ago showing that he remained in     remission.  He will remain on his Proscar for his prostate cancer     and he will continue to be followed by Dr. [name], his urologist.  DISCHARGE MEDICATIONS: 1. Acetaminophen 325 mg tabs 2 every 6 hours as needed. 2. Erythropoietin 20,000 units IV every Monday, Wednesday, Friday with     each hemodialysis treatment. 3. Ethyl chloride spray  to be used topically prior to each     hemodialysis  treatment. 4. InFeD 50 mg IV weekly (following a 25 mg IV test dose). 5. Nepro 237 mL one twice a day as needed. 6. Protonix 40 mg p.o. daily. 7. Zemplar 1 mcg IV with each hemodialysis treatment. 8. Nephro-Vite 1 p.o. daily. 9. Sarna anti-itch lotion to be applied topically as needed. 10.Sorbitol 70% solution 30 mL twice a day as needed for constipation. 11.Enteric-coated 81 mg aspirin every other day. 12.Coreg 6.25 mg p.o. b.i.d. (hold a.m. dose prior to hemodialysis). 13.Finasteride 5 mg p.o. at nighttime. 14.Hydroxyzine 25 mg 1 tablet every 6 hours as needed for itching. 15.Lipitor 10 mg 1 p.o. at nighttime. 16.PhosLo 667 mg 1 three times a day with meals.  HEMODIALYSIS DISCHARGE MEDICATIONS: 1. Erythropoietin 20,000 units IV every treatment. 2. Zemplar 1 mcg IV every treatment. 3. InFeD 50 mg IV every week. 4. Heparin standard dosing.  HEMODIALYSIS DISCHARGE PRESCRIPTION:  He will hemodialyze every Monday, Wednesday, Friday.  Initial treatment Friday November 05, 2010, for 3-1/2 hours with a 4-hour treatment the following Monday.  He will utilize an 180 Optiflux dialyzer on a 2 potassium, 2.5 calcium bath.  His initial estimated dry weight will be 82 kg.  Blood flow rate 350 to be advanced to 400 and a dialysate flow rate of 800.  He utilize standard dose of heparin and a linear 148 sodium modeling.  His left forearm AV fistula will be used for vascular access with a #15 gauge needle.  He will also be advanced to 15 gauge needles.  LABORATORY DATA:  At the time of discharge November 03, 2010, WBC 4.0, RBC 2.90, hemoglobin 9.1, hematocrit 26.5, platelet count 97.  Sodium 139, potassium 4.2, chloride 104, CO2 22, glucose 116, BUN 93, creatinine 6.02, albumin 2.9 calcium 8.9, phosphorus 6.4.  DISCHARGE VITAL SIGNS:  Temperature 98.2, pulse 60, respirations 18, blood pressure 112/69, oxygen saturation 98% on room air.  Weight 82 kg.  DISCHARGE DISPOSITION:  He will be discharged  home to begin outpatient hemodialysis at [facility]).  His wife will be able to transport him to and from his hemodialysis sessions at this time.  However, if this becomes problematic, she will address this with the [place] worker.  He has been instructed to follow up with his primary care physician within 2 weeks of discharge and to continue with his previously planned followup with his cardiologist or on an as- needed basis.  He will be seen regularly by Nephrology in the outpatient setting.  Time spent on discharge an hour.     Nicholas Heller, FNP-C   ______________________________ [name], M.D.    ASK/MEDQ  D:  11/03/2010  T:  11/03/2010  Job:  [id number]  cc:   Donato Heinz, M.D. Janalyn Rouse, M.D. Thomas C. Verl Blalock, MD, [facility] Marshall Cork. [name], M.D. [facility]  Electronically Signed by Linna Hoff F.N.P. on 11/04/2010 12:54:03 PM Electronically Signed by Mauricia Area M.D. on 11/04/2010 02:03:19 PM

## 2010-11-04 NOTE — Consult Note (Signed)
[name], [name]               ACCOUNT NO.:  [id number]  MEDICAL RECORD NO.:  [id number]  LOCATION:  6712                         FACILITY:  [facility]  PHYSICIAN:  [name] L. Jaquin Coy, M.D.DATE OF BIRTH:  [birth year]  DATE OF CONSULTATION:  10/29/2010 DATE OF DISCHARGE:                                CONSULTATION   PRIMARY CARE PROVIDER:  Janalyn Rouse, MD  NEPHROLOGIST:  Donato Heinz, MD at [facility].  CARDIOLOGIST:  Marijo Conception. Wall, MD, [place]  UROLOGIST:  Marshall Cork. [name], MD  CHIEF COMPLAINT:  Uremia.  HISTORY OF PRESENT ILLNESS:  This is a 60 year old male who was directly admitted from his nephrologist from [place] after presenting with uremia.  The patient has a history of the stage V chronic kidney disease and had an AV fistula placed about a year ago in January 2011.  He also has a history of bilateral renal cell carcinoma and is status post freezing and ablation.  He had a recent MRI about 3 weeks ago that shows that his renal carcinoma remains in remission.  The patient's creatinine has been trending up for the past several weeks. Yesterday, his creatinine was 6.74, which was up from 5.37 on October 14, 2010.  The patient also reports decreased energy, nausea and vomiting for the past couple of weeks.  Dr. Marval Regal has been discussing hemodialysis with the patient for a while now.  The patient was initially resistant, but is now amenable to therapy.  ALLERGIES:  CODEINE.  MEDICATIONS: 1. Lipids. 2. Lasix 40 mg p.o. b.i.d. 3. Procrit twice weekly. 4. Iron sulfate 325 mg p.o. daily. 5. Calcitriol 0.25 mcg on Mondays, Wednesdays and Fridays. 6. PhosLo t.i.d. W.C. 7. Aspirin 81 mg p.o. every other day. 8. Coreg 6.25 mg p.o. b.i.d. 9. Atarax p.r.n.  PAST MEDICAL HISTORY: 1. Stage V chronic kidney disease with AV fistula. 2. Coronary artery disease.  The patient had a previous     catheterization, but did not require any  intervention. 3. Diabetes x30 years, now diet controlled.  The patient required     insulin in the past, but is no longer on medications after losing     significant weight. 4. Peripheral vascular disease, status post vein stripping and right     midfoot amputation. 5. Hyperlipidemia. 6. History of depression. 7. Bilateral renal cell carcinoma, status post freezing and ablation.     Currently in remission.  PAST SURGICAL HISTORY: 1. AV fistula in January 2011. 2. Cholecystectomy. 3. Vein stripping x2. 4. Right mid foot amputation. 5. Both eyes were removed secondary to pain from glaucoma. 6. Bilateral kidney freezing and ablation.  SOCIAL HISTORY:  The patient lives in [place] with his wife.  He grew up in the [place] area.  Regarding his education, he graduated from high school and attended 1 year of business college.  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  Never smoked.  Denies alcohol.  Denies other drugs.  FAMILY HISTORY:  Significant for spine cancer in his mother, emphysema and kidney cancer in his father, a cousin with diabetes, and lung cancer, prostate cancer and heart attacks  in his siblings.  REVIEW OF SYSTEMS:  Positive for weight changes (reports 20-pound weight gain in the past 2 weeks), orthopnea (uses two pillows at night), nausea, vomiting, diarrhea, weakness, and dizziness.  Denies fevers, chills, sweats, changes in appetite, headache, chest pain, edema, palpitations, dyspnea, abdominal pain, myalgias, arthralgias, and swelling.  PHYSICAL EXAMINATION:  VITAL SIGNS:  Temperature 97.8, pulse 64, respiratory rate 20, blood pressure 169/71, PO2 95% on room air.  Weight 97.1 kg. GENERAL:  Somnolent, resting his chin on his chest, not apparent distress. HEENT:  Prosthetic eyes bilaterally, moist mucous membranes. NECK:  No jugular venous distention. CARDIOVASCULAR:  Arrhythmia, decreased heart sounds, no murmurs, rubs  or gallops. LUNGS:  Clear to auscultation bilaterally. ABDOMEN:  Normoactive bowel sounds, soft, nontender, nondistended, obese with excess skin, no hepatomegaly. EXTREMITIES:  3+ pitting edema bilaterally. NEUROLOGIC:  Alert and oriented fully, appropriate to questions. SKIN:  Bruises throughout.  LABS AND STUDIES:  From yesterday on October 28, 2010; sodium 142, potassium 4.5, chloride 113, bicarb 12, glucose 124, BUN 187, creatinine 6.74, calcium 7.5, albumin 3.5.  Iron 203, 21% saturation, ferritin 226. Intact PTH 8.3.  ASSESSMENT AND PLAN:  This is a 60 year old male with a past medical history significant for stage III chronic kidney disease with matured arteriovenous fistula, bilateral renal cell carcinoma, longstanding diabetes, hypertension, coronary artery and peripheral vascular disease, who presents with uremic symptoms and worsening BUN and creatinine over the past few weeks. 1. Renal.  We will start hemodialysis today using the matured     arteriovenous fistula.  We will check initial labs for hemodialysis     including CMET, CBC, and phosphorus.  The patient is fluid     overloaded.  We will remove 3 liters of fluid at hemodialysis.  We     will start Nephro-Vitamin. 2. Secondary hyperthyroidism.  We will increase Zemplar to 1 mg IV at     hemodialysis and continue PhosLo. 3. Anemia of chronic kidney disease.  We will check CBC.  We will give     IV iron x2 and Aranesp 200 mcg q. weekly. 4. Hypertension and coronary artery disease.  We will remove fluids at     hemodialysis.  The patient did not take his last couple of doses of     the Coreg and the Lasix.  We will continue his home Coreg. 5. Gastroenterology.  We will start the patient on a tentative renal     diet. 6. Fluids, electrolytes, nutrition.  We will help-lock IV.  We will     fluid restrict at 1200 mL daily. 7. Disposition.  Pending clinical stability.  The patient will need     several consecutive  dialysis sessions.    ______________________________ Karen Kays, MD   ______________________________ Joyice Faster. Zylee Marchiano, M.D.    AO/MEDQ  D:  10/29/2010  T:  10/30/2010  Job:  [id number]  Electronically Signed by Karen Kays MD on 11/01/2010 02:12:29 PM Electronically Signed by Mauricia Area M.D. on 11/04/2010 02:03:14 PM

## 2010-11-24 ENCOUNTER — Other Ambulatory Visit: Payer: 59

## 2010-12-21 ENCOUNTER — Ambulatory Visit
Admission: RE | Admit: 2010-12-21 | Discharge: 2010-12-21 | Disposition: A | Discharge status: Home / Self Care | Payer: 59 | Source: Ambulatory Visit | Attending: Interventional Radiology | Admitting: Interventional Radiology

## 2010-12-22 LAB — BASIC METABOLIC PANEL
BUN: 48 — ABNORMAL HIGH
CO2: 25
Calcium: 7.8 — ABNORMAL LOW
Calcium: 8.3 — ABNORMAL LOW
Chloride: 105
Creatinine, Ser: 1.61 — ABNORMAL HIGH
GFR calc Af Amer: 50 — ABNORMAL LOW
GFR calc Af Amer: 54 — ABNORMAL LOW
GFR calc non Af Amer: 41 — ABNORMAL LOW
Potassium: 4
Sodium: 146 — ABNORMAL HIGH

## 2010-12-22 LAB — CROSSMATCH: ABO/RH(D): A POS

## 2010-12-22 LAB — URINE MICROSCOPIC-ADD ON

## 2010-12-22 LAB — CBC
HCT: 33.4 — ABNORMAL LOW
Hemoglobin: 11.5 — ABNORMAL LOW
MCHC: 34.1
MCV: 86.8
Platelets: 117 — ABNORMAL LOW
RDW: 15.2
WBC: 6.2

## 2010-12-22 LAB — PROTIME-INR: INR: 1

## 2010-12-22 LAB — URINALYSIS, ROUTINE W REFLEX MICROSCOPIC
Bilirubin Urine: NEGATIVE
Glucose, UA: NEGATIVE
Ketones, ur: NEGATIVE
Leukocytes, UA: NEGATIVE
Protein, ur: 100 — AB
pH: 5

## 2010-12-22 LAB — ABO/RH: ABO/RH(D): A POS

## 2010-12-22 LAB — APTT: aPTT: 37

## 2010-12-22 NOTE — Progress Notes (Signed)
Started dialysis on 10-29-2010, on M, W, F schedule at dialysis center on [facility].    Pt states that he is tolerating dialysis well.

## 2010-12-23 ENCOUNTER — Other Ambulatory Visit: Payer: Self-pay | Admitting: Interventional Radiology

## 2010-12-23 ENCOUNTER — Other Ambulatory Visit (HOSPITAL_COMMUNITY): Payer: Self-pay | Admitting: Interventional Radiology

## 2010-12-23 DIAGNOSIS — Z992 Dependence on renal dialysis: Secondary | ICD-10-CM

## 2010-12-23 DIAGNOSIS — C649 Malignant neoplasm of unspecified kidney, except renal pelvis: Secondary | ICD-10-CM

## 2011-01-10 LAB — CBC
HCT: 29.7 — ABNORMAL LOW
Hemoglobin: 10.3 — ABNORMAL LOW
Hemoglobin: 11.2 — ABNORMAL LOW
MCV: 86.2
RBC: 3.77 — ABNORMAL LOW
RDW: 14.3
WBC: 7.4

## 2011-01-10 LAB — DIFFERENTIAL
Basophils Absolute: 0
Basophils Relative: 0
Basophils Relative: 0
Eosinophils Relative: 4
Lymphocytes Relative: 9 — ABNORMAL LOW
Lymphs Abs: 0.7
Lymphs Abs: 0.9
Monocytes Relative: 5
Monocytes Relative: 9
Neutro Abs: 4
Neutro Abs: 6.2
Neutrophils Relative %: 84 — ABNORMAL HIGH

## 2011-01-10 LAB — BASIC METABOLIC PANEL
Calcium: 8.5
Chloride: 110
Creatinine, Ser: 1.89 — ABNORMAL HIGH
GFR calc Af Amer: 45 — ABNORMAL LOW
GFR calc non Af Amer: 37 — ABNORMAL LOW
GFR calc non Af Amer: 43 — ABNORMAL LOW
Glucose, Bld: 128 — ABNORMAL HIGH
Potassium: 3.6
Sodium: 144
Sodium: 144

## 2011-01-13 ENCOUNTER — Encounter (INDEPENDENT_AMBULATORY_CARE_PROVIDER_SITE_OTHER): Payer: Self-pay | Admitting: General Surgery

## 2011-01-13 ENCOUNTER — Ambulatory Visit (INDEPENDENT_AMBULATORY_CARE_PROVIDER_SITE_OTHER): Payer: 59 | Admitting: General Surgery

## 2011-01-13 VITALS — BP 118/60 | HR 62 | Temp 97.1°F | Resp 16

## 2011-01-13 DIAGNOSIS — N186 End stage renal disease: Secondary | ICD-10-CM

## 2011-01-13 NOTE — Progress Notes (Signed)
Subjective:   Desire for peritoneal dialysis  Patient ID: [name], male   DOB: June 28, 1950, 61 y.o.   MRN: [id number]  HPI Patient is a 60 year old male followed by Dr. Kathe [name]. He has end-stage renal disease secondary to diabetes and hypertension and has been on hemodialysis for several months. He has a functioning fistula in his left arm. He and his wife are interested in peritoneal dialysis and he has been evaluated and felt to be a good candidate. He is blind in his wife will be taking care of the catheter. l Past Medical History  Diagnosis Date  . CAD, NATIVE VESSEL 01/08/2008  . HYPERLIPIDEMIA-MIXED 01/08/2008  . OVERWEIGHT/OBESITY 01/08/2008  . RENAL DISEASE, CHRONIC 01/27/2009  . Edema 01/08/2008  . HYPERTENSION 01/27/2009  . DM type 2 (diabetes mellitus, type 2)   . Renal failure   . Renal cell carcinoma   . Anemia   . Blood transfusion   . Bruises easily   . Blind     both eyes removed   . Family history of breast cancer     mother   Past Surgical History  Procedure Date  . Venectomy   . Enucleation     bilateral  . Foot amputation     left - partial  . Graft in left arm 04/2010  . Kidney ablasion 04/2010  . Cholecystectomy 1992   Current Outpatient Prescriptions  Medication Sig Dispense Refill  . carvedilol (COREG) 6.25 MG tablet Take 6.25 mg by mouth 2 (two) times daily with a meal. Patient takes 1/2 in am and pm.       . aspirin 81 MG tablet Take 81 mg by mouth daily.        . calcitRIOL (ROCALTROL) 0.25 MCG capsule Take 0.25 mcg by mouth. Take Monday Wednesday and Friday       . calcium acetate (PHOSLO) 667 MG capsule Take 667 mg by mouth 3 (three) times daily with meals.        [name] epoetin alfa (PROCRIT) 16109 UNIT/ML injection Inject 30,000 Units into the skin every 14 (fourteen) days. At dialysis      . ferrous sulfate 325 (65 FE) MG tablet Take 325 mg by mouth daily with breakfast.        . finasteride (PROSCAR) 5 MG tablet Take 5 mg by mouth daily.          [name] LIPITOR 10 MG tablet TAKE 1 TABLET EVERY DAY  30 tablet  11   Allergies  Allergen Reactions  . Codeine Other (See Comments)    Makes patient incoherent.   History  Substance Use Topics  . Smoking status: Never Smoker   . Smokeless tobacco: Never Used  . Alcohol Use: No    Review of Systems  Eyes: Positive for visual disturbance.  Respiratory: Negative.   Cardiovascular: Negative.   Gastrointestinal: Negative.        Objective:   Physical Exam Gen.: Alert obviously blind Caucasian male Skin: Warm and dry without rash infection HEENT: No palpable mass or thyromegaly. Oropharynx clear. Lungs: Clear without wheezing or increased work of breathing Cardiac: Regular rate and rhythm without murmur. No edema. Abdomen: Well-healed right upper quadrant incision. Soft and nontender without masses organomegaly appreciated. No hernias identified. Extremities: Functioning fistula in the left upper extremity. No edema Neurologic: Alert and fully oriented. Motor and sensory exams grossly normal.    Assessment:     End-stage renal disease with desire for peritoneal dialysis. With his wife is  help I believe he would be a good candidate. We discussed the nature of the procedure including risks of anesthetic complications, bleeding, infection, and intestinal injury. We discussed long-term problems with the catheters including peritonitis and exit site infections, catheter displacement and occlusion. They understand and desire to proceed.    Plan:     Placement of peritoneal dialysis catheter laparoscopically under general anesthesia with an overnight hospitalization.

## 2011-01-13 NOTE — Patient Instructions (Signed)
Call for any questions prior to your procedure

## 2011-01-19 LAB — CBC
HCT: 34.9 — ABNORMAL LOW
MCHC: 34.6
MCV: 89
Platelets: 144 — ABNORMAL LOW
RDW: 13.1

## 2011-02-01 ENCOUNTER — Ambulatory Visit (HOSPITAL_COMMUNITY): Admission: RE | Admit: 2011-02-01 | Payer: 59 | Source: Ambulatory Visit | Admitting: General Surgery

## 2011-02-22 ENCOUNTER — Encounter: Payer: Self-pay | Admitting: Physician Assistant

## 2011-03-01 ENCOUNTER — Encounter (INDEPENDENT_AMBULATORY_CARE_PROVIDER_SITE_OTHER): Payer: Self-pay | Admitting: Nephrology

## 2011-03-02 ENCOUNTER — Encounter (INDEPENDENT_AMBULATORY_CARE_PROVIDER_SITE_OTHER): Payer: Self-pay

## 2011-04-13 ENCOUNTER — Telehealth: Payer: Self-pay | Admitting: Cardiology

## 2011-04-13 NOTE — Telephone Encounter (Addendum)
[place] faxed to [facility] @ [phone] 04/13/11/KM  Refaxed All Cardiac to [facility] @ [phone]  04/21/11/KM ( Records from 2008-present)

## 2011-05-17 ENCOUNTER — Telehealth: Payer: Self-pay | Admitting: Gastroenterology

## 2011-05-19 ENCOUNTER — Other Ambulatory Visit: Payer: Self-pay | Admitting: Gastroenterology

## 2011-05-19 MED ORDER — PEG-KCL-NACL-NASULF-NA ASC-C 100 G PO SOLR: 1.0000 | Freq: Once | ORAL | Status: DC

## 2011-05-19 NOTE — Telephone Encounter (Signed)
Addended by: Algernon Huxley R on: 05/19/2011 02:16 PM   Modules accepted: Orders

## 2011-05-19 NOTE — Telephone Encounter (Signed)
Pt scheduled for colon with erbe 06/07/11@ [facility]. Arrival time 11:30am for 12:30pm appt. Pt aware of appt date and time. Prep rx sent to the pharmacy. Instructions mailed to the pt.

## 2011-05-24 ENCOUNTER — Telehealth: Payer: Self-pay

## 2011-05-24 NOTE — Telephone Encounter (Signed)
Pts appt rescheduled to 06/14/11@[facility]  at 12:30pm. Pts wife aware of the new appt date and time for colon with apc.

## 2011-06-09 ENCOUNTER — Encounter (HOSPITAL_COMMUNITY): Payer: Self-pay | Admitting: *Deleted

## 2011-06-14 ENCOUNTER — Encounter (HOSPITAL_COMMUNITY)
Admission: RE | Disposition: A | Discharge status: Home / Self Care | Payer: Self-pay | Source: Ambulatory Visit | Attending: Gastroenterology

## 2011-06-14 ENCOUNTER — Encounter (HOSPITAL_COMMUNITY): Payer: Self-pay | Admitting: *Deleted

## 2011-06-14 ENCOUNTER — Ambulatory Visit (HOSPITAL_COMMUNITY)
Admission: RE | Admit: 2011-06-14 | Discharge: 2011-06-14 | Disposition: A | Discharge status: Home / Self Care | Payer: 59 | Source: Ambulatory Visit | Attending: Gastroenterology | Admitting: Gastroenterology

## 2011-06-14 DIAGNOSIS — E669 Obesity, unspecified: Secondary | ICD-10-CM | POA: Insufficient documentation

## 2011-06-14 DIAGNOSIS — E782 Mixed hyperlipidemia: Secondary | ICD-10-CM | POA: Insufficient documentation

## 2011-06-14 DIAGNOSIS — I129 Hypertensive chronic kidney disease with stage 1 through stage 4 chronic kidney disease, or unspecified chronic kidney disease: Secondary | ICD-10-CM | POA: Insufficient documentation

## 2011-06-14 DIAGNOSIS — Z01812 Encounter for preprocedural laboratory examination: Secondary | ICD-10-CM | POA: Insufficient documentation

## 2011-06-14 DIAGNOSIS — Z860100 Personal history of colon polyps, unspecified: Secondary | ICD-10-CM | POA: Insufficient documentation

## 2011-06-14 DIAGNOSIS — I2510 Atherosclerotic heart disease of native coronary artery without angina pectoris: Secondary | ICD-10-CM | POA: Insufficient documentation

## 2011-06-14 DIAGNOSIS — D126 Benign neoplasm of colon, unspecified: Secondary | ICD-10-CM | POA: Insufficient documentation

## 2011-06-14 DIAGNOSIS — E119 Type 2 diabetes mellitus without complications: Secondary | ICD-10-CM | POA: Insufficient documentation

## 2011-06-14 DIAGNOSIS — Z1211 Encounter for screening for malignant neoplasm of colon: Secondary | ICD-10-CM

## 2011-06-14 DIAGNOSIS — N189 Chronic kidney disease, unspecified: Secondary | ICD-10-CM | POA: Insufficient documentation

## 2011-06-14 HISTORY — PX: COLONOSCOPY: SHX5424

## 2011-06-14 HISTORY — PX: HOT HEMOSTASIS: SHX5433

## 2011-06-14 LAB — GLUCOSE, CAPILLARY
Glucose-Capillary: 74 mg/dL (ref 70–99)
Glucose-Capillary: 67 mg/dL — ABNORMAL LOW (ref 70–99)

## 2011-06-14 SURGERY — COLONOSCOPY
Anesthesia: Moderate Sedation

## 2011-06-14 MED ORDER — SODIUM CHLORIDE 0.9 % IV SOLN
INTRAVENOUS | Status: DC
  Administered 2011-06-14: 500 mL via INTRAVENOUS

## 2011-06-14 MED ORDER — MIDAZOLAM HCL 10 MG/2ML IJ SOLN
INTRAMUSCULAR | Status: AC
  Filled 2011-06-14: qty 2

## 2011-06-14 MED ORDER — FENTANYL NICU IV SYRINGE 50 MCG/ML
INJECTION | INTRAMUSCULAR | Status: DC | PRN
  Administered 2011-06-14 (×2): 25 ug via INTRAVENOUS

## 2011-06-14 MED ORDER — SPOT INK MARKER SYRINGE KIT
PACK | SUBMUCOSAL | Status: DC | PRN
  Administered 2011-06-14: 1.5 mL via SUBMUCOSAL

## 2011-06-14 MED ORDER — SPOT INK MARKER SYRINGE KIT
PACK | SUBMUCOSAL | Status: AC
  Filled 2011-06-14: qty 5

## 2011-06-14 MED ORDER — FENTANYL CITRATE 0.05 MG/ML IJ SOLN
INTRAMUSCULAR | Status: AC
  Filled 2011-06-14: qty 2

## 2011-06-14 MED ORDER — MIDAZOLAM HCL 5 MG/5ML IJ SOLN
INTRAMUSCULAR | Status: DC | PRN
  Administered 2011-06-14 (×3): 2 mg via INTRAVENOUS

## 2011-06-14 NOTE — Progress Notes (Signed)
Pt CBG was 67 and was asymptomatic, gave 4 oz of soft drink. CBG rechecked 15 minutes later, and was 76.

## 2011-06-14 NOTE — H&P (Signed)
History of Present Illness:  This 61 year old white male underwent colonoscopy with removal of a 5 mm sessile polyp at proximal transverse colon and a 3 cm polyp in the area of the hepatic flexure in November, 2010.  followup colonoscopy in one year was recommended. He is here today for a followup exam.    Past Medical History  Diagnosis Date  . CAD, NATIVE VESSEL 01/08/2008  . HYPERLIPIDEMIA-MIXED 01/08/2008  . OVERWEIGHT/OBESITY 01/08/2008  . RENAL DISEASE, CHRONIC 01/27/2009  . Edema 01/08/2008  . HYPERTENSION 01/27/2009  . DM type 2 (diabetes mellitus, type 2)   . Anemia   . Blood transfusion   . Bruises easily   . Blind     both eyes removed   . Family history of breast cancer     mother  . Renal failure   . Renal cell carcinoma    Past Surgical History  Procedure Date  . Venectomy   . Foot amputation     left - partial  . Graft in left arm 04/2010  . Kidney ablasion 04/2010  . Cholecystectomy 1992  . Enucleation     bilateral  . Vein strippings     bilateral legs, knee down. x1 on each. For varicose veins.   . Fistula in left arm    family history includes Breast cancer in an unspecified family member; Cancer in his brother, sister, and unspecified family member; Cancer (age of onset:73) in his father; Cancer (age of onset:82) in his mother; Coronary artery disease in an unspecified family member; Diabetes in an unspecified family member; Emphysema in his father; Kidney disease in an unspecified family member; Lung cancer in an unspecified family member; and Ovarian cancer in an unspecified family member.  There is no history of Malignant hyperthermia. Current Facility-Administered Medications  Medication Dose Route Frequency Provider Last Rate Last Dose  . 0.9 %  sodium chloride infusion   Intravenous Continuous [name], MD 20 mL/hr at 06/14/11 1229 500 mL at 06/14/11 1229   Allergies as of 05/19/2011 - Review Complete 01/13/2011  Allergen Reaction Noted  .  Codeine Other (See Comments)     reports that he has never smoked. He has never used smokeless tobacco. He reports that he does not drink alcohol or use illicit drugs.     Review of Systems: Pertinent positive and negative review of systems were noted in the above HPI section. All other review of systems were otherwise negative.  Vital signs were reviewed in today's medical record Physical Exam: Skin: rosacea on face General: Well developed , well nourished, no acute distress Head: Normocephalic and atraumatic Eyes:  sclerae anicteric, EOMI Ears: Normal auditory acuity Mouth: No deformity or lesions Neck: Supple, no masses or thyromegaly Lungs: Clear throughout to auscultation Heart: Regular rate and rhythm; no murmurs, rubs or bruits Abdomen: Soft, non tender and non distended. No masses, hepatosplenomegaly or hernias noted. Normal Bowel sounds Rectal:deferred Musculoskeletal: Symmetrical with no gross deformities  Skin: No lesions on visible extremities Pulses:  Normal pulses noted Extremities: No clubbing, cyanosis, edema or deformities noted Neurological: Alert oriented x 4, grossly nonfocal Cervical Nodes:  No significant cervical adenopathy Inguinal Nodes: No significant inguinal adenopathy Psychological:  Alert and cooperative. Normal mood and affect  Imp - h/o large, sessile polyp in hepatic flexure  Plan - colonoscopy

## 2011-06-14 NOTE — Progress Notes (Deleted)
Pt CBG was 67 and was asymptomatic, gave 4 oz of soft drink. CBG rechecked 5 minutes later and was 76

## 2011-06-14 NOTE — Discharge Instructions (Addendum)
Repeat colonoscopy in one year  Colonoscopy Care After Read the instructions outlined below and refer to this sheet in the next few weeks. These discharge instructions provide you with general information on caring for yourself after you leave the hospital. Your doctor may also give you specific instructions. While your treatment has been planned according to the most current medical practices available, unavoidable complications occasionally occur. If you have any problems or questions after discharge, call your doctor. HOME CARE INSTRUCTIONS ACTIVITY:  You may resume your regular activity, but move at a slower pace for the next 24 hours.   Take frequent rest periods for the next 24 hours.   Walking will help get rid of the air and reduce the bloated feeling in your belly (abdomen).   No driving for 24 hours (because of the medicine (anesthesia) used during the test).   You may shower.   Do not sign any important legal documents or operate any machinery for 24 hours (because of the anesthesia used during the test).  NUTRITION:  Drink plenty of fluids.   You may resume your normal diet as instructed by your doctor.   Begin with a light meal and progress to your normal diet. Heavy or fried foods are harder to digest and may make you feel sick to your stomach (nauseated).   Avoid alcoholic beverages for 24 hours or as instructed.  MEDICATIONS:  You may resume your normal medications unless your doctor tells you otherwise.  WHAT TO EXPECT TODAY:  Some feelings of bloating in the abdomen.   Passage of more gas than usual.   Spotting of blood in your stool or on the toilet paper.  IF YOU HAD POLYPS REMOVED DURING THE COLONOSCOPY:  No aspirin products for 7 days or as instructed.   No alcohol for 7 days or as instructed.   Eat a soft diet for the next 24 hours.  FINDING OUT THE RESULTS OF YOUR TEST Not all test results are available during your visit. If your test results are  not back during the visit, make an appointment with your caregiver to find out the results. Do not assume everything is normal if you have not heard from your caregiver or the medical facility. It is important for you to follow up on all of your test results.  SEEK IMMEDIATE MEDICAL CARE IF:  You have more than a spotting of blood in your stool.   Your belly is swollen (abdominal distention).   You are nauseated or vomiting.   You have a fever.   You have abdominal pain or discomfort that is severe or gets worse throughout the day.  Document Released: 11/03/2003 Document Revised: 03/10/2011 Document Reviewed: 11/01/2007 [facility] Patient Information 2012 [place].

## 2011-06-14 NOTE — Op Note (Signed)
[facility] [place], [place]  [address]  COLONOSCOPY PROCEDURE REPORT  PATIENT:  [name], [name]  MR#:  [id number] BIRTHDATE:  01/17/1951, 14 yrs. old  GENDER:  male ENDOSCOPIST:  Sandy Salaam. Deatra Ina, MD REF. BY:  Janalyn Rouse, M.D. PROCEDURE DATE:  06/14/2011 PROCEDURE:  Colonoscopy with polypectomy and submucosal injection, Colonoscopy with snare polypectomy, Colonoscopy with tumor ablation ASA CLASS:  Class II INDICATIONS:  Screening, history of pre-cancerous (adenomatous) colon polyps Index polypectomy 2010 MEDICATIONS:   These medications were titrated to patient response per physician's verbal order, Fentanyl 50 mcg IV, Versed 6 mg IV  DESCRIPTION OF PROCEDURE:   After the risks benefits and alternatives of the procedure were thoroughly explained, informed consent was obtained.  Digital rectal exam was performed and revealed no abnormalities.   The [id number] endoscope was introduced through the anus and advanced to the cecum, which was identified by both the appendix and ileocecal valve, limited by poor preparation.  moderate amount of retained liquid stool  The quality of the prep was Moviprep fair.  The instrument was then slowly withdrawn as the colon was fully examined. <<PROCEDUREIMAGES>>  FINDINGS:  There were multiple polyps identified and removed. there were multiple polyps ranging from 3-5 cm in the 6 cecum, a setting of proximal transverse colon. A removal with cold polypectomy Jesus Genera pathology (see image2, image5, and image6). A sessile polyp was found in the mid transverse colon. a 1.5 cm sessile polyp corresponding to the prior sessile polyp was seen in the transverse colon. 1/2 cc of "spot" was injected proximal and distal to the polyp. 4 cc of normal saline was injected; sleep in and around the polyp. The polyp was removed piecemeal with a hot polypectomy snare. All remnants were fulgurated utilizing the argon plasma coag (see  image14).  There were multiple polyps identified and removed. in the descending colon. Invasive 2 other 3-4 mm sessile polyps were seen and removed with cold polypectomy snare in the descending colon (see image15).  This was otherwise a normal examination of the colon (see image17).   Retroflexed views in the rectum revealed no abnormalities.    The time to cecum = minutes. The scope was then withdrawn in  1) 40.0  minutes from the cecum and the procedure completed. COMPLICATIONS:  None ENDOSCOPIC IMPRESSION: 1) Polyps, multiple 2) Sessile polyp in the mid transverse colon 3) Polyps, multiple in the descending colon 4) Otherwise normal examination  Colonic Polyposis RECOMMENDATIONS: 1) Colonoscopy REPEAT EXAM:  In 1 year(s) for Colonoscopy.  ______________________________ Sandy Salaam. Deatra Ina, MD  CC:  n. eSIGNED:   Sandy Salaam. Sayer Masini at 06/14/2011 01:44 PM  [name], [id number]

## 2011-06-15 ENCOUNTER — Encounter (HOSPITAL_COMMUNITY): Payer: Self-pay | Admitting: Gastroenterology

## 2011-06-16 ENCOUNTER — Encounter: Payer: Self-pay | Admitting: Gastroenterology

## 2011-06-21 ENCOUNTER — Encounter: Payer: Self-pay | Admitting: Physician Assistant

## 2011-08-14 ENCOUNTER — Other Ambulatory Visit: Payer: Self-pay | Admitting: Cardiology

## 2011-08-30 ENCOUNTER — Encounter: Payer: Self-pay | Admitting: Physician Assistant

## 2011-08-30 DIAGNOSIS — E119 Type 2 diabetes mellitus without complications: Secondary | ICD-10-CM | POA: Insufficient documentation

## 2011-09-14 ENCOUNTER — Telehealth: Payer: Self-pay | Admitting: Cardiology

## 2011-09-14 NOTE — Telephone Encounter (Signed)
New msg Pt is going to [name] for transplant. He needs a cardiac cath. He wants to talk to you

## 2011-09-14 NOTE — Telephone Encounter (Signed)
Pt calls today for follow-up appt with Dr. Verl Blalock b/c he has been told by transplant team at [facility] he needs to have a cardiac cath prior to be placed on the kidney transplant list. Pt has already had a stress test and recent ECHO at [facility] with cardiologist there.   They would like Dr. Verl Blalock to review information and tests. They will be bringing this information to appt on 09/21/11 with [place] PA. Pt would like to have heart catheterization here. His last cath was ?12 years ago.  I have made appt for pt with [place] PA on 09/21/11 same day as Dr. Verl Blalock in office as pt would like to be seen as soon as possible in order to be considered for the transplant waiting list.  Pt and wife are comfortable with this arrangement.  Horton Chin RN

## 2011-09-21 ENCOUNTER — Encounter: Payer: Self-pay | Admitting: Physician Assistant

## 2011-09-21 ENCOUNTER — Ambulatory Visit (INDEPENDENT_AMBULATORY_CARE_PROVIDER_SITE_OTHER): Payer: 59 | Admitting: Physician Assistant

## 2011-09-21 ENCOUNTER — Encounter: Payer: Self-pay | Admitting: *Deleted

## 2011-09-21 VITALS — BP 103/69 | HR 85 | Ht 74.0 in | Wt 199.0 lb

## 2011-09-21 DIAGNOSIS — E785 Hyperlipidemia, unspecified: Secondary | ICD-10-CM

## 2011-09-21 DIAGNOSIS — I739 Peripheral vascular disease, unspecified: Secondary | ICD-10-CM

## 2011-09-21 DIAGNOSIS — N186 End stage renal disease: Secondary | ICD-10-CM

## 2011-09-21 DIAGNOSIS — I2510 Atherosclerotic heart disease of native coronary artery without angina pectoris: Secondary | ICD-10-CM

## 2011-09-21 DIAGNOSIS — R9439 Abnormal result of other cardiovascular function study: Secondary | ICD-10-CM

## 2011-09-21 NOTE — H&P (Signed)
History and Physical     Date:  09/21/2011   Name:  [name]   DOB:  02/08/1951   MRN:  [id number]  PCP:  Janalyn Rouse, MD  Primary Cardiologist:  Dr. Jenell Milliner  Primary Electrophysiologist:  None    History of Present Illness: [name] is a 61 y.o. male who returns for evaluation of an abnormal nuclear test.  He has a hx of HTN, diet controlled DM2, HL, ESRD, renal carcinoma and previously dx non-obstructive CAD.  Last LHC 5/03: mLAD 70%.  Last nuclear study 7/08: no ischemia, EF 47%.  He is in the process of being eval for renal transplant at [facility].  Myoview 08/30/11 at [facility]:  No scar, + mild apical, apical lateral, mid anterolateral ischemia, EF 49%, coronary calcification in LAD, RCA and CFX.  The patient denies chest pain, shortness of breath, syncope, orthopnea, PND or significant pedal edema.  He goes to dialysis MWF.  He is eager to have a cardiac cath soon.    Wt Readings from Last 3 Encounters:  09/21/11 199 lb (90.266 kg)  06/14/11 188 lb (85.276 kg)  06/14/11 188 lb (85.276 kg)     Potassium  Date/Time Value Range Status  11/03/2010  7:10 AM 4.2  3.5 - 5.1 mEq/L Final     Creatinine, Ser  Date/Time Value Range Status  11/03/2010  7:10 AM 6.02* 0.50 - 1.35 mg/dL Final     ALT  Date/Time Value Range Status  10/29/2010  8:55 PM 28  0 - 53 U/L Final     Hemoglobin  Date/Time Value Range Status  11/03/2010  7:10 AM 9.1* 13.0 - 17.0 g/dL Final    Past Medical History  Diagnosis Date  . CAD, NATIVE VESSEL 01/08/2008  . HYPERLIPIDEMIA-MIXED 01/08/2008  . OVERWEIGHT/OBESITY 01/08/2008  . RENAL DISEASE, CHRONIC 01/27/2009  . Edema 01/08/2008  . HYPERTENSION 01/27/2009  . DM type 2 (diabetes mellitus, type 2)   . Anemia   . Blood transfusion   . Bruises easily   . Blind     both eyes removed   . Family history of breast cancer     mother  . Renal failure   . Renal cell carcinoma     Current Outpatient Prescriptions  Medication Sig Dispense Refill  .  acetaminophen (TYLENOL) 325 MG tablet Take 650 mg by mouth as needed.       [name] atorvastatin (LIPITOR) 10 MG tablet TAKE 1 TABLET EVERY DAY  30 tablet  2  . b complex-vitamin c-folic acid (NEPHRO-VITE) 0.8 MG TABS Take 0.8 mg by mouth at bedtime.      . calcium acetate (PHOSLO) 667 MG capsule Take 1,334 mg by mouth 3 (three) times daily with meals.       . docusate sodium (COLACE) 100 MG capsule Take 100 mg by mouth as needed.      [name] epoetin alfa (PROCRIT) 16109 UNIT/ML injection Inject 30,000 Units into the skin every 14 (fourteen) days. At dialysis      . finasteride (PROSCAR) 5 MG tablet Take 5 mg by mouth at bedtime.       [name] DISCONTD: atorvastatin (LIPITOR) 10 MG tablet Take 10 mg by mouth at bedtime.        Allergies: Allergies  Allergen Reactions  . Codeine Other (See Comments)    Makes patient incoherent.    History  Substance Use Topics  . Smoking status: Never Smoker   . Smokeless tobacco: Never Used  .  Alcohol Use: No    Family History  Problem Relation Age of Onset  . Cancer    . Coronary artery disease    . Kidney disease    . Breast cancer    . Ovarian cancer    . Lung cancer    . Diabetes    . Cancer Mother 21    breast, spine and ovarian  . Cancer Father 67    kidney, prostate  . Emphysema Father   . Cancer Sister     ovarian  . Cancer Brother     lung  . Malignant hyperthermia Neg Hx      ROS:  Please see the history of present illness.  He has had a recent elevated PSA.   All other systems reviewed and negative.   PHYSICAL EXAM: VS:  BP 103/69  Pulse 85  Ht 6\' 2"  (1.88 m)  Wt 199 lb (90.266 kg)  BMI 25.55 kg/m2 Well nourished, well developed, in no acute distress HEENT: normal Neck: no JVD at 90 degrees Vascular: No carotid bruits Cardiac:  normal S1, S2; RRR; no murmur Lungs:  clear to auscultation bilaterally, no wheezing, rhonchi or rales Abd: soft, nontender, no hepatomegaly Ext: no edema Skin: warm and dry Neuro:  CNs 2-12 intact, no  focal abnormalities noted Psych: normal affect   EKG:  Sinus rhythm, heart rate 72, normal axis, PACs, T-wave inversions in 1 and aVL   ASSESSMENT AND PLAN:  1.  Coronary Artery Disease He has an abnormal Myoview scan suggestive of anterior ischemia.  Prior lesion was in the LAD and nonobstructive by cardiac catheterization in 2003.  He needs to undergo cardiac catheterization for further evaluation.  I discussed this with Dr. Verl Blalock who agrees.  Risks and benefits of cardiac catheterization have been discussed with the patient.  These include bleeding, infection, kidney damage, stroke, heart attack, death.  The patient understands these risks and is willing to proceed.   2.  End Stage Renal Disease We will arrange his cardiac catheterization around the time of his dialysis.  3.  Hyperlipidemia Continue Lipitor  4.  Elevated PSA Biopsy pending with urology in 11/2011.   Signed, Richardson Dopp, PA-C  3:59 PM 09/21/2011   I have taken a history, reviewed medications, allergies, PMH, SH, FH, and reviewed ROS and examined the patient.  I agree with the assessment and plan.  Ugonna Keirsey C. Verl Blalock, MD, [place] Pager:  [phone]

## 2011-09-21 NOTE — Progress Notes (Signed)
[place],   [address] Phone: [phone] Fax:  [phone]  Date:  09/21/2011   Name:  [name]   DOB:  Nov 10, 1950   MRN:  [id number]  PCP:  Janalyn Rouse, MD  Primary Cardiologist:  Dr. Jenell Milliner  Primary Electrophysiologist:  None    History of Present Illness: [name] is a 61 y.o. male who returns for evaluation of an abnormal nuclear test.  He has a hx of HTN, diet controlled DM2, HL, ESRD, renal carcinoma and previously dx non-obstructive CAD.  Last LHC 5/03: mLAD 70%.  Last nuclear study 7/08: no ischemia, EF 47%.  He is in the process of being eval for renal transplant at [facility].  Myoview 08/30/11 at [facility]:  No scar, + mild apical, apical lateral, mid anterolateral ischemia, EF 49%, coronary calcification in LAD, RCA and CFX.  The patient denies chest pain, shortness of breath, syncope, orthopnea, PND or significant pedal edema.  He goes to dialysis MWF.  He is eager to have a cardiac cath soon.    Wt Readings from Last 3 Encounters:  09/21/11 199 lb (90.266 kg)  06/14/11 188 lb (85.276 kg)  06/14/11 188 lb (85.276 kg)     Potassium  Date/Time Value Range Status  11/03/2010  7:10 AM 4.2  3.5 - 5.1 mEq/L Final     Creatinine, Ser  Date/Time Value Range Status  11/03/2010  7:10 AM 6.02* 0.50 - 1.35 mg/dL Final     ALT  Date/Time Value Range Status  10/29/2010  8:55 PM 28  0 - 53 U/L Final     Hemoglobin  Date/Time Value Range Status  11/03/2010  7:10 AM 9.1* 13.0 - 17.0 g/dL Final    Past Medical History  Diagnosis Date  . CAD, NATIVE VESSEL 01/08/2008  . HYPERLIPIDEMIA-MIXED 01/08/2008  . OVERWEIGHT/OBESITY 01/08/2008  . RENAL DISEASE, CHRONIC 01/27/2009  . Edema 01/08/2008  . HYPERTENSION 01/27/2009  . DM type 2 (diabetes mellitus, type 2)   . Anemia   . Blood transfusion   . Bruises easily   . Blind     both eyes removed   . Family history of breast cancer     mother  . Renal failure   . Renal cell  carcinoma     Current Outpatient Prescriptions  Medication Sig Dispense Refill  . acetaminophen (TYLENOL) 325 MG tablet Take 650 mg by mouth as needed.       [name] atorvastatin (LIPITOR) 10 MG tablet TAKE 1 TABLET EVERY DAY  30 tablet  2  . b complex-vitamin c-folic acid (NEPHRO-VITE) 0.8 MG TABS Take 0.8 mg by mouth at bedtime.      . calcium acetate (PHOSLO) 667 MG capsule Take 1,334 mg by mouth 3 (three) times daily with meals.       . docusate sodium (COLACE) 100 MG capsule Take 100 mg by mouth as needed.      [name] epoetin alfa (PROCRIT) 91478 UNIT/ML injection Inject 30,000 Units into the skin every 14 (fourteen) days. At dialysis      . finasteride (PROSCAR) 5 MG tablet Take 5 mg by mouth at bedtime.       [name] DISCONTD: atorvastatin (LIPITOR) 10 MG tablet Take 10 mg by mouth at bedtime.        Allergies: Allergies  Allergen Reactions  . Codeine Other (See Comments)    Makes patient incoherent.    History  Substance Use Topics  . Smoking status:  Never Smoker   . Smokeless tobacco: Never Used  . Alcohol Use: No    Family History  Problem Relation Age of Onset  . Cancer    . Coronary artery disease    . Kidney disease    . Breast cancer    . Ovarian cancer    . Lung cancer    . Diabetes    . Cancer Mother 40    breast, spine and ovarian  . Cancer Father 53    kidney, prostate  . Emphysema Father   . Cancer Sister     ovarian  . Cancer Brother     lung  . Malignant hyperthermia Neg Hx      ROS:  Please see the history of present illness.  He has had a recent elevated PSA.   All other systems reviewed and negative.   PHYSICAL EXAM: VS:  BP 103/69  Pulse 85  Ht 6\' 2"  (1.88 m)  Wt 199 lb (90.266 kg)  BMI 25.55 kg/m2 Well nourished, well developed, in no acute distress HEENT: normal Neck: no JVD at 90 degrees Vascular: No carotid bruits Cardiac:  normal S1, S2; RRR; no murmur Lungs:  clear to auscultation bilaterally, no wheezing, rhonchi or rales Abd: soft,  nontender, no hepatomegaly Ext: no edema Skin: warm and dry Neuro:  CNs 2-12 intact, no focal abnormalities noted Psych: normal affect   EKG:  Sinus rhythm, heart rate 72, normal axis, PACs, T-wave inversions in 1 and aVL   ASSESSMENT AND PLAN:  1.  Coronary Artery Disease He has an abnormal Myoview scan suggestive of anterior ischemia.  Prior lesion was in the LAD and nonobstructive by cardiac catheterization in 2003.  He needs to undergo cardiac catheterization for further evaluation.  I discussed this with Dr. Verl Blalock who agrees.  Risks and benefits of cardiac catheterization have been discussed with the patient.  These include bleeding, infection, kidney damage, stroke, heart attack, death.  The patient understands these risks and is willing to proceed.   2.  End Stage Renal Disease We will arrange his cardiac catheterization around the time of his dialysis.  3.  Hyperlipidemia Continue Lipitor  4.  Elevated PSA Biopsy pending with urology in 11/2011.   Signed, Richardson Dopp, PA-C  3:59 PM 09/21/2011

## 2011-09-21 NOTE — Patient Instructions (Addendum)
Your physician has requested that you have a LEFT HEART CATH cardiac catheterization WITH DR. Lia Foyer DX ABNORMAL STRESS TEST THIS WILL BE DONE 09/27/11 @ 9 AM IN THE MAIN CATH LAB, YOU WILL NEED TO BE THERE BY 7 AM. Cardiac catheterization is used to diagnose and/or treat various heart conditions. Doctors may recommend this procedure for a number of different reasons. The most common reason is to evaluate chest pain. Chest pain can be a symptom of coronary artery disease (CAD), and cardiac catheterization can show whether plaque is narrowing or blocking your heart's arteries. This procedure is also used to evaluate the valves, as well as measure the blood flow and oxygen levels in different parts of your heart. For further information please visit [email or web address]. Please follow instruction sheet, as given.  PRE CATH LABS TODAY BMET, CBC W/DIFF, PT/INR

## 2011-09-22 ENCOUNTER — Encounter (HOSPITAL_COMMUNITY): Payer: Self-pay | Admitting: Pharmacy Technician

## 2011-09-22 LAB — CBC WITH DIFFERENTIAL/PLATELET
Basophils Absolute: 0 10*3/uL (ref 0.0–0.1)
Eosinophils Relative: 1.2 % (ref 0.0–5.0)
HCT: 38.1 % — ABNORMAL LOW (ref 39.0–52.0)
Lymphocytes Relative: 8.7 % — ABNORMAL LOW (ref 12.0–46.0)
Monocytes Relative: 10.6 % (ref 3.0–12.0)
Platelets: 149 10*3/uL — ABNORMAL LOW (ref 150.0–400.0)
RDW: 14 % (ref 11.5–14.6)
WBC: 6.7 10*3/uL (ref 4.5–10.5)

## 2011-09-22 LAB — BASIC METABOLIC PANEL
BUN: 47 mg/dL — ABNORMAL HIGH (ref 6–23)
Calcium: 9.6 mg/dL (ref 8.4–10.5)
GFR: 17.11 mL/min — ABNORMAL LOW (ref >60.00)
Glucose, Bld: 215 mg/dL — ABNORMAL HIGH (ref 70–99)
Potassium: 4.1 mEq/L (ref 3.5–5.1)
Sodium: 141 mEq/L (ref 135–145)

## 2011-09-22 LAB — PROTIME-INR
INR: 1 ratio (ref 0.8–1.0)
Prothrombin Time: 10.9 s (ref 10.2–12.4)

## 2011-09-27 ENCOUNTER — Encounter (HOSPITAL_COMMUNITY)
Admission: RE | Disposition: A | Discharge status: Home / Self Care | Payer: Self-pay | Source: Ambulatory Visit | Attending: Surgery

## 2011-09-27 ENCOUNTER — Inpatient Hospital Stay (HOSPITAL_COMMUNITY)
Admission: RE | Admit: 2011-09-27 | Discharge: 2011-10-05 | DRG: 982 | Disposition: A | Discharge status: Home / Self Care | Payer: 59 | Source: Ambulatory Visit | Attending: Surgery | Admitting: Surgery

## 2011-09-27 ENCOUNTER — Encounter (HOSPITAL_COMMUNITY): Payer: Self-pay | Admitting: Cardiology

## 2011-09-27 ENCOUNTER — Other Ambulatory Visit: Payer: Self-pay | Admitting: Surgery

## 2011-09-27 DIAGNOSIS — I2584 Coronary atherosclerosis due to calcified coronary lesion: Secondary | ICD-10-CM | POA: Diagnosis present

## 2011-09-27 DIAGNOSIS — Z79899 Other long term (current) drug therapy: Secondary | ICD-10-CM

## 2011-09-27 DIAGNOSIS — N186 End stage renal disease: Secondary | ICD-10-CM | POA: Diagnosis present

## 2011-09-27 DIAGNOSIS — I2510 Atherosclerotic heart disease of native coronary artery without angina pectoris: Secondary | ICD-10-CM | POA: Diagnosis present

## 2011-09-27 DIAGNOSIS — Z992 Dependence on renal dialysis: Secondary | ICD-10-CM

## 2011-09-27 DIAGNOSIS — H547 Unspecified visual loss: Secondary | ICD-10-CM

## 2011-09-27 DIAGNOSIS — Z7982 Long term (current) use of aspirin: Secondary | ICD-10-CM

## 2011-09-27 DIAGNOSIS — N2581 Secondary hyperparathyroidism of renal origin: Secondary | ICD-10-CM | POA: Diagnosis present

## 2011-09-27 DIAGNOSIS — D696 Thrombocytopenia, unspecified: Secondary | ICD-10-CM | POA: Diagnosis not present

## 2011-09-27 DIAGNOSIS — S98919A Complete traumatic amputation of unspecified foot, level unspecified, initial encounter: Secondary | ICD-10-CM

## 2011-09-27 DIAGNOSIS — D62 Acute posthemorrhagic anemia: Secondary | ICD-10-CM | POA: Diagnosis not present

## 2011-09-27 DIAGNOSIS — E782 Mixed hyperlipidemia: Secondary | ICD-10-CM | POA: Diagnosis present

## 2011-09-27 DIAGNOSIS — K5900 Constipation, unspecified: Secondary | ICD-10-CM | POA: Diagnosis not present

## 2011-09-27 DIAGNOSIS — E1169 Type 2 diabetes mellitus with other specified complication: Principal | ICD-10-CM | POA: Diagnosis present

## 2011-09-27 DIAGNOSIS — I120 Hypertensive chronic kidney disease with stage 5 chronic kidney disease or end stage renal disease: Secondary | ICD-10-CM | POA: Diagnosis present

## 2011-09-27 DIAGNOSIS — Z951 Presence of aortocoronary bypass graft: Secondary | ICD-10-CM

## 2011-09-27 DIAGNOSIS — Z85528 Personal history of other malignant neoplasm of kidney: Secondary | ICD-10-CM

## 2011-09-27 DIAGNOSIS — R9439 Abnormal result of other cardiovascular function study: Secondary | ICD-10-CM

## 2011-09-27 DIAGNOSIS — H540X55 Blindness right eye category 5, blindness left eye category 5: Secondary | ICD-10-CM | POA: Diagnosis present

## 2011-09-27 HISTORY — DX: Cellulitis, unspecified: L03.90

## 2011-09-27 HISTORY — DX: Peripheral vascular disease, unspecified: I73.9

## 2011-09-27 HISTORY — DX: Pneumonia, unspecified organism: J18.9

## 2011-09-27 HISTORY — DX: Dependence on renal dialysis: Z99.2

## 2011-09-27 HISTORY — DX: End stage renal disease: N18.6

## 2011-09-27 HISTORY — PX: LEFT HEART CATHETERIZATION WITH CORONARY ANGIOGRAM: SHX5451

## 2011-09-27 LAB — GLUCOSE, CAPILLARY
Glucose-Capillary: 139 mg/dL — ABNORMAL HIGH (ref 70–99)
Glucose-Capillary: 132 mg/dL — ABNORMAL HIGH (ref 70–99)
Glucose-Capillary: 98 mg/dL (ref 70–99)
Glucose-Capillary: 183 mg/dL — ABNORMAL HIGH (ref 70–99)

## 2011-09-27 LAB — POCT I-STAT, CHEM 8
BUN: 84 mg/dL — ABNORMAL HIGH (ref 6–23)
Calcium, Ion: 1.16 mmol/L (ref 1.12–1.32)
Chloride: 99 mEq/L (ref 96–112)
Creatinine, Ser: 4.4 mg/dL — ABNORMAL HIGH (ref 0.50–1.35)
Glucose, Bld: 146 mg/dL — ABNORMAL HIGH (ref 70–99)

## 2011-09-27 SURGERY — LEFT HEART CATHETERIZATION WITH CORONARY ANGIOGRAM
Anesthesia: LOCAL

## 2011-09-27 MED ORDER — ASPIRIN EC 81 MG PO TBEC
81.0000 mg | DELAYED_RELEASE_TABLET | Freq: Every day | ORAL | Status: DC
  Administered 2011-09-28: 81 mg via ORAL
  Filled 2011-09-27 (×2): qty 1

## 2011-09-27 MED ORDER — ONDANSETRON HCL 4 MG PO TABS: 4.0000 mg | ORAL_TABLET | Freq: Four times a day (QID) | ORAL | Status: DC | PRN

## 2011-09-27 MED ORDER — SODIUM CHLORIDE 0.9 % IJ SOLN: 3.0000 mL | Freq: Two times a day (BID) | INTRAMUSCULAR | Status: DC

## 2011-09-27 MED ORDER — NITROGLYCERIN 0.4 MG SL SUBL: 0.4000 mg | SUBLINGUAL_TABLET | SUBLINGUAL | Status: DC | PRN

## 2011-09-27 MED ORDER — ATORVASTATIN CALCIUM 10 MG PO TABS
10.0000 mg | ORAL_TABLET | Freq: Every day | ORAL | Status: DC
  Administered 2011-09-27 – 2011-10-04 (×7): 10 mg via ORAL
  Filled 2011-09-27 (×10): qty 1

## 2011-09-27 MED ORDER — LIDOCAINE HCL (PF) 1 % IJ SOLN
INTRAMUSCULAR | Status: AC
  Filled 2011-09-27: qty 30

## 2011-09-27 MED ORDER — SODIUM CHLORIDE 0.9 % IV SOLN: 250.0000 mL | INTRAVENOUS | Status: DC | PRN

## 2011-09-27 MED ORDER — CAMPHOR-MENTHOL 0.5-0.5 % EX LOTN
1.0000 "application " | TOPICAL_LOTION | Freq: Three times a day (TID) | CUTANEOUS | Status: DC | PRN
  Filled 2011-09-27: qty 222

## 2011-09-27 MED ORDER — ACETAMINOPHEN 325 MG PO TABS: 650.0000 mg | ORAL_TABLET | ORAL | Status: DC | PRN

## 2011-09-27 MED ORDER — ACETAMINOPHEN 650 MG RE SUPP: 650.0000 mg | Freq: Four times a day (QID) | RECTAL | Status: DC | PRN

## 2011-09-27 MED ORDER — IRON DEXTRAN 50 MG/ML IJ SOLN
50.0000 mg | INTRAMUSCULAR | Status: DC
  Filled 2011-09-27 (×3): qty 1

## 2011-09-27 MED ORDER — HYDROXYZINE HCL 25 MG PO TABS
25.0000 mg | ORAL_TABLET | Freq: Three times a day (TID) | ORAL | Status: DC | PRN
  Administered 2011-09-28: 25 mg via ORAL
  Filled 2011-09-27: qty 1

## 2011-09-27 MED ORDER — LIDOCAINE HCL (PF) 1 % IJ SOLN
5.0000 mL | INTRAMUSCULAR | Status: DC | PRN
  Filled 2011-09-27: qty 5

## 2011-09-27 MED ORDER — CALCIUM ACETATE 667 MG PO CAPS
1334.0000 mg | ORAL_CAPSULE | Freq: Three times a day (TID) | ORAL | Status: DC
  Filled 2011-09-27 (×3): qty 2

## 2011-09-27 MED ORDER — ATORVASTATIN CALCIUM 10 MG PO TABS
10.0000 mg | ORAL_TABLET | Freq: Every day | ORAL | Status: DC
  Filled 2011-09-27: qty 1

## 2011-09-27 MED ORDER — FINASTERIDE 5 MG PO TABS
5.0000 mg | ORAL_TABLET | Freq: Every day | ORAL | Status: DC
  Administered 2011-09-27 – 2011-10-04 (×7): 5 mg via ORAL
  Filled 2011-09-27 (×10): qty 1

## 2011-09-27 MED ORDER — INSULIN ASPART 100 UNIT/ML ~~LOC~~ SOLN
0.0000 [IU] | Freq: Three times a day (TID) | SUBCUTANEOUS | Status: DC
  Administered 2011-09-28: 2 [IU] via SUBCUTANEOUS

## 2011-09-27 MED ORDER — DOCUSATE SODIUM 100 MG PO CAPS
100.0000 mg | ORAL_CAPSULE | Freq: Every day | ORAL | Status: DC | PRN
  Filled 2011-09-27: qty 1

## 2011-09-27 MED ORDER — CALCIUM CARBONATE 1250 MG/5ML PO SUSP
500.0000 mg | Freq: Four times a day (QID) | ORAL | Status: DC | PRN
  Filled 2011-09-27: qty 5

## 2011-09-27 MED ORDER — HEPARIN (PORCINE) IN NACL 2-0.9 UNIT/ML-% IJ SOLN
INTRAMUSCULAR | Status: AC
  Filled 2011-09-27: qty 2000

## 2011-09-27 MED ORDER — RENA-VITE PO TABS
1.0000 | ORAL_TABLET | Freq: Every day | ORAL | Status: DC
  Filled 2011-09-27: qty 1

## 2011-09-27 MED ORDER — HEPARIN SODIUM (PORCINE) 1000 UNIT/ML DIALYSIS
20.0000 [IU]/kg | INTRAMUSCULAR | Status: DC | PRN
  Filled 2011-09-27: qty 2

## 2011-09-27 MED ORDER — SODIUM CHLORIDE 0.9 % IJ SOLN: 3.0000 mL | INTRAMUSCULAR | Status: DC | PRN

## 2011-09-27 MED ORDER — SODIUM CHLORIDE 0.9 % IV SOLN: 100.0000 mL | INTRAVENOUS | Status: DC | PRN

## 2011-09-27 MED ORDER — SODIUM CHLORIDE 0.9 % IV SOLN
250.0000 mL | INTRAVENOUS | Status: DC | PRN
Start: 2011-09-27 — End: 2011-09-27

## 2011-09-27 MED ORDER — ASPIRIN 81 MG PO CHEW
CHEWABLE_TABLET | ORAL | Status: AC
  Administered 2011-09-27: 81 mg
  Filled 2011-09-27: qty 1

## 2011-09-27 MED ORDER — CALCIUM ACETATE 667 MG PO CAPS
1334.0000 mg | ORAL_CAPSULE | Freq: Three times a day (TID) | ORAL | Status: DC
  Administered 2011-09-27 – 2011-10-05 (×19): 1334 mg via ORAL
  Filled 2011-09-27 (×28): qty 2

## 2011-09-27 MED ORDER — HEPARIN SODIUM (PORCINE) 1000 UNIT/ML DIALYSIS
1000.0000 [IU] | INTRAMUSCULAR | Status: DC | PRN
  Filled 2011-09-27: qty 1

## 2011-09-27 MED ORDER — LIDOCAINE-PRILOCAINE 2.5-2.5 % EX CREA
1.0000 "application " | TOPICAL_CREAM | CUTANEOUS | Status: DC | PRN
  Filled 2011-09-27: qty 5

## 2011-09-27 MED ORDER — NITROGLYCERIN 0.2 MG/ML ON CALL CATH LAB
INTRAVENOUS | Status: AC
  Filled 2011-09-27: qty 1

## 2011-09-27 MED ORDER — ALTEPLASE 2 MG IJ SOLR
2.0000 mg | Freq: Once | INTRAMUSCULAR | Status: AC | PRN
  Filled 2011-09-27: qty 2

## 2011-09-27 MED ORDER — PENTAFLUOROPROP-TETRAFLUOROETH EX AERO: 1.0000 "application " | INHALATION_SPRAY | CUTANEOUS | Status: DC | PRN

## 2011-09-27 MED ORDER — ASPIRIN 81 MG PO CHEW: 81.0000 mg | CHEWABLE_TABLET | ORAL | Status: DC

## 2011-09-27 MED ORDER — ONDANSETRON HCL 4 MG/2ML IJ SOLN: 4.0000 mg | Freq: Four times a day (QID) | INTRAMUSCULAR | Status: DC | PRN

## 2011-09-27 MED ORDER — NEPRO/CARBSTEADY PO LIQD
237.0000 mL | ORAL | Status: DC | PRN
  Filled 2011-09-27: qty 237

## 2011-09-27 MED ORDER — ACETAMINOPHEN 325 MG PO TABS: 650.0000 mg | ORAL_TABLET | Freq: Four times a day (QID) | ORAL | Status: DC | PRN

## 2011-09-27 MED ORDER — SODIUM CHLORIDE 0.9 % IV SOLN
INTRAVENOUS | Status: DC
  Administered 2011-09-27: 1000 mL via INTRAVENOUS

## 2011-09-27 MED ORDER — ZOLPIDEM TARTRATE 5 MG PO TABS: 5.0000 mg | ORAL_TABLET | Freq: Every evening | ORAL | Status: DC | PRN

## 2011-09-27 MED ORDER — SORBITOL 70 % SOLN
30.0000 mL | Status: DC | PRN
  Filled 2011-09-27: qty 30

## 2011-09-27 MED ORDER — RENA-VITE PO TABS
1.0000 | ORAL_TABLET | Freq: Every day | ORAL | Status: DC
  Administered 2011-09-27 – 2011-10-04 (×7): 1 via ORAL
  Filled 2011-09-27 (×10): qty 1

## 2011-09-27 MED ORDER — DOCUSATE SODIUM 283 MG RE ENEM
1.0000 | ENEMA | RECTAL | Status: DC | PRN
  Filled 2011-09-27: qty 1

## 2011-09-27 MED ORDER — PARICALCITOL 5 MCG/ML IV SOLN
1.0000 ug | INTRAVENOUS | Status: DC
  Administered 2011-09-28 – 2011-10-05 (×3): 1 ug via INTRAVENOUS
  Filled 2011-09-27 (×4): qty 0.2

## 2011-09-27 NOTE — CV Procedure (Signed)
   Cardiac Catheterization Procedure Note  Name: [name] MRN: [id number] DOB: [birth year]  Procedure: Left Heart Cath, Selective Coronary Angiography, LV angiography  Indication: abnormal study done for possible transplant.  Long history of diabetes.  Abnormal cath a number of years ago treated medically.     Procedural details: The right groin was prepped, draped, and anesthetized with 1% lidocaine. Using modified Seldinger technique, a 4 French sheath was introduced into the right femoral artery. Standard Judkins catheters were used for coronary angiography and left ventriculography. Catheter exchanges were performed over a guidewire. There were no immediate procedural complications. The patient was transferred to the post catheterization recovery area for further monitoring.  Procedural Findings: Hemodynamics:  AO 131/72 (96) LV 141/18  Gradient hard to measure   Coronary angiography: Coronary dominance: right  All three arteries are heavily calcified.   Left mainstem: No significant obstruction  Left anterior descending (LAD): Heavily calcified vessel throughout.  Heavily calcified 80% proximal lesion followed by 70% just after the diagonal.  The vessel is a typical diabetic artery. There is a 60% mid lesion.  The apical LAD is a graftable vessel.  The diagonal is relatively small and has ostial 5 0% narrowing.    Left circumflex (LCx): THere is a small ramus with diffuse irregularity.  The circumflex supplies a very large OM with 95% proximal narrowing.  There is modest bifurcation, non obstructive plaque with about 30-40% involvement in the large superior branch of the OM.    Right coronary artery (RCA): Heavily calcified vessel with diffuse, segmental, calcified disease throughout the proximal vessel.  There is 30-40% mid disease.  The PDA has a long segmental area of 60% narrowing throughout the proximal segment.  The PLA 1 has about 50-60% ostial narrowing.  The  remainder of the PLA system is ok.    Left ventriculography: Left ventricular systolic function is normal, LVEF is estimated at 55-65%, there is no significant mitral regurgitation   Final Conclusions:   1.  Severe diabetic three vessel disease with critical disease of the LAD and circumflex 2.  Preserved LV function 3.  ESRD and diabetes.  Recommendations:  1.  Admit for surgical consult.  Has history of prior vein stripping.  Consult called.    Bing Quarry 09/27/2011, 10:21 AM

## 2011-09-27 NOTE — Interval H&P Note (Signed)
History and Physical Interval Note:  09/27/2011 9:29 AM  [name]  has presented today for surgery, with the diagnosis of Chest pain  The various methods of treatment have been discussed with the patient and family. After consideration of risks, benefits and other options for treatment, the patient has consented to  Procedure(s) (LRB): LEFT HEART CATHETERIZATION WITH CORONARY ANGIOGRAM (N/A) as a surgical intervention .  The patient's history has been reviewed, patient examined, no change in status, stable for surgery.  I have reviewed the patients' chart and labs.  Questions were answered to the patient's satisfaction.     Bing Quarry

## 2011-09-27 NOTE — Progress Notes (Signed)
This visit was in response to a request from the pt. Pt and wife is Catholic.  Pt's wife, [name], was in the room with pt.  [name] left to "give her husband some privacy."   Pt, [name], and I conversed about his health, his uncertainty over this hospitalization, the obstacles he has overcome, his spirituality, and his concern for his wife.  [name] has a very positive attitude and he is concerned that his wife is not handling his diagnosis very well.  He requested that I speak with her to see if she needed support.  I found his wife, [name], in the hallway with her sister.  She is very upset over her husband's health.  I offered emotional and spiritual support and we ended the visit praying together.  I will continue to follow.  Please page the on-call pager if further assistance is needed. [name]  [phone]  On-call pager  09/27/11 1411  Clinical Encounter Type  Visited With Patient;Family  Visit Type Spiritual support  Referral From Family  Spiritual Encounters  Spiritual Needs Emotional;Prayer  Stress Factors  Patient Stress Factors Major life changes;Health changes;Family relationships  Family Stress Factors Major life changes;Loss of control;Lack of knowledge;Family relationships

## 2011-09-27 NOTE — H&P (Signed)
History and Physical  Patient ID: [name] MRN: [id number], DOB: 1950-08-01 Date of Encounter: 09/27/2011, 11:07 AM Primary Physician: Janalyn Rouse, MD Primary Cardiologist: Dr. Verl Blalock  Chief Complaint: abnormal nuclear study, here for cath  HPI: [name] is a 61 y.o. male who presented to [facility] today for cardiac cath to evaluate findings of an abnormal nuclear stress test. He has a hx of HTN, diet controlled DM2, HL, ESRD, renal carcinoma and previously dx non-obstructive CAD. Last LHC 5/03: mLAD 70%. Last nuclear study 7/08: no ischemia, EF 47%. He is in the process of being eval for renal transplant at [facility]. Myoview 08/30/11 at [facility]: No scar, + mild apical, apical lateral, mid anterolateral ischemia, EF 49%, coronary calcification in LAD, RCA and CFX. The patient denies chest pain, shortness of breath, syncope, orthopnea, PND or significant pedal edema. He goes to dialysis MWF. Today, cardiac cath demonstrated multivessel CAD. Plan is for TCTS eval. He denies any post-cath symptoms and currently feels well.   Past Medical History  Diagnosis Date  . CAD, NATIVE VESSEL 01/08/2008  . HYPERLIPIDEMIA-MIXED 01/08/2008  . OVERWEIGHT/OBESITY 01/08/2008  . RENAL DISEASE, CHRONIC 01/27/2009  . Edema 01/08/2008  . HYPERTENSION 01/27/2009  . DM type 2 (diabetes mellitus, type 2)   . Anemia   . Blood transfusion   . Bruises easily   . Blind     both eyes removed   . Family history of breast cancer     mother  . Renal failure   . Renal cell carcinoma      Most Recent Cardiac Studies: Recent nuclear study - see above  12/2009 Echo Study Conclusions - Left ventricle: The cavity size was normal. Wall thickness was increased in a pattern of mild LVH. Systolic function was normal. The estimated ejection fraction was in the range of 50% to 55%. Wall motion was normal; there were no regional wall motion abnormalities. Doppler parameters are consistent with abnormal left  ventricular relaxation (grade 1 diastolic dysfunction). - Mitral valve: Mildly thickened, mildly calcified leaflets . Mild regurgitation. - Pericardium, extracardiac: A trivial pericardial effusion was identified.     Surgical History:  Past Surgical History  Procedure Date  . Venectomy   . Foot amputation     left - partial  . Graft in left arm 04/2010  . Kidney ablasion 04/2010  . Cholecystectomy 1992  . Enucleation     bilateral  . Vein strippings     bilateral legs, knee down. x1 on each. For varicose veins.   . Fistula in left arm   . Colonoscopy 06/14/2011    Procedure: COLONOSCOPY;  Surgeon: [name], MD;  Location: [facility] ENDOSCOPY;  Service: Endoscopy;  Laterality: N/A;  . Hot hemostasis 06/14/2011    Procedure: HOT HEMOSTASIS (ARGON PLASMA COAGULATION/BICAP);  Surgeon: [name], MD;  Location: [facility];  Service: Endoscopy;  Laterality: N/A;     Home Meds: Prior to Admission medications   Medication Sig Start Date End Date Taking? Authorizing Provider  aspirin EC 81 MG tablet Take 81 mg by mouth daily.   Yes Historical Provider, MD  atorvastatin (LIPITOR) 10 MG tablet Take 10 mg by mouth daily.   Yes Historical Provider, MD  b complex-vitamin c-folic acid (NEPHRO-VITE) 0.8 MG TABS Take 0.8 mg by mouth at bedtime.   Yes Historical Provider, MD  calcium acetate (PHOSLO) 667 MG capsule Take 1,334 mg by mouth 3 (three) times daily with meals.    Yes  Historical Provider, MD  docusate sodium (COLACE) 100 MG capsule Take 100 mg by mouth daily as needed. constipation   Yes Historical Provider, MD  finasteride (PROSCAR) 5 MG tablet Take 5 mg by mouth at bedtime.    Yes Historical Provider, MD  acetaminophen (TYLENOL) 325 MG tablet Take 650 mg by mouth every 6 (six) hours as needed. For pain    Historical Provider, MD    Allergies:  Allergies  Allergen Reactions  . Codeine Other (See Comments)    Makes patient incoherent. STATES MAKES HIM COMATOSE  . Tape Other  (See Comments)    Plastic tape tears skin off, please use paper tape instead.    History   Social History  . Marital Status: Married    Spouse Name: N/A    Number of Children: N/A  . Years of Education: N/A   Occupational History  . Not on file.   Social History Main Topics  . Smoking status: Never Smoker   . Smokeless tobacco: Never Used  . Alcohol Use: No  . Drug Use: No  . Sexually Active: Not on file   Other Topics Concern  . Not on file   Social History Narrative  . No narrative on file     Family History  Problem Relation Age of Onset  . Cancer    . Coronary artery disease    . Kidney disease    . Breast cancer    . Ovarian cancer    . Lung cancer    . Diabetes    . Cancer Mother 5    breast, spine and ovarian  . Cancer Father 24    kidney, prostate  . Emphysema Father   . Cancer Sister     ovarian  . Cancer Brother     lung  . Malignant hyperthermia Neg Hx     Review of Systems: General: negative for chills, fever, night sweats or weight changes.  Cardiovascular: see above Dermatological: negative for rash Respiratory: negative for cough or wheezing Urologic: negative for hematuria Abdominal: negative for nausea, vomiting, diarrhea. Pt is blind so is unsure if any BRBPR Neurologic: negative for syncope or dizziness. Chronically blind. All other systems reviewed and are otherwise negative except as noted above.  Labs:   Lab Results  Component Value Date   WBC 6.7 09/21/2011   HGB 10.5* 09/27/2011   HCT 31.0* 09/27/2011   MCV 102.1* 09/21/2011   PLT 149.0* 09/21/2011    Lab 09/27/11 0852 09/21/11 1620  NA 141 --  K 3.7 --  CL 99 --  CO2 -- 33*  BUN 84* --  CREATININE 4.40* --  CALCIUM -- 9.6  PROT -- --  BILITOT -- --  ALKPHOS -- --  ALT -- --  AST -- --  GLUCOSE 146* --    Lab Results  Component Value Date   CHOL 121 06/09/2009   HDL 64.40 06/09/2009   LDLCALC 38 06/09/2009   TRIG 91.0 06/09/2009    Radiology/Studies: cardiac  cath, see full report   EKG: 6/19 OV: Sinus rhythm, heart rate 72, normal axis, PACs, T-wave inversions in 1 and aVL  Physical Exam: Blood pressure 128/79, pulse 61, temperature 97 F (36.1 C), temperature source Oral, resp. rate 18, height 6\' 4"  (1.93 m), weight 190 lb (86.183 kg), SpO2 100.00%. General: Well developed, well nourished WM in no acute distress. Head: Normocephalic, atraumatic, sclera non-icteric, no xanthomas, nares are without discharge. H/o enucleation  Neck: . JVD not elevated.  Lungs: Clear bilaterally to auscultation without wheezes, rales, or rhonchi. Breathing is unlabored. Heart: Somewhat distant heart sounds but RRR with S1 S2. No murmurs, rubs, or gallops appreciated. Abdomen: Soft, non-tender, non-distended with normoactive bowel sounds. No hepatomegaly. No rebound/guarding. No obvious abdominal masses. Msk:  Strength and tone appear normal for age. Extremities: No clubbing or cyanosis. No edema.  Distal pedal pulses are 2+ and equal bilaterally. Neuro: Alert and oriented X 3. Moves all extremities spontaneously. Psych:  Responds to questions appropriately with a normal affect.    ASSESSMENT AND PLAN:   1. Multivessel CAD 2. ESRD with hx of renal carcinoma, in eval at [facility] for transplant 3. DM 4. Elevated PSA, pending uro bx 11/2011 5. Blindness  Per discussion with Dr. Lia Foyer, the patient will be admitted to telemetry with home meds continued, no heparin for now, no IVF given ESRD. Will add SSI for DM. Birdie Sons, cath lab coordinator, has notified TCTS and nephrology of consultation requests. Further recs are pending.    The patient also inquired if we have any support services to provide some emotional support to his wife during this time, i.e. Chaplain services - I have asked nursing to help contact them as well.  Signed, Nicholas Copa PA-C 09/27/2011, 11:07 AM  Patient is admitted after cardiac cath for surgical evaluation.  He was getting an evaluation  by Dr. Verl Blalock for renal transplant and sent for cath.  See my report.  Has severe disease.  Surgical consult called.

## 2011-09-27 NOTE — Consult Note (Signed)
[facility] Renal Consultation Note    Indication for Consultation:  Management of ESRD/hemodialysis; anemia, hypertension/volume and secondary hyperparathyroidism  HPI: [name] is a 61 y.o. male with ESRD secondary to diabetes on MWF HD at [facility] since July 2012 presented for routine heart cath to evaluate abnormal nuclear stress test done at [facility] as part of his transplant work up.  Cardiac Catheterization today by Dr. Lia Foyer showed severe three vessel disease with critical disease of the LAD and circumflex.  Cardio vascular surgery has been consulted. He states he has not had any chest pain or SOB, but occasionally has BP drop on HD.  He exercises regularly at home.  Past Medical History  Diagnosis Date  . CAD, NATIVE VESSEL 01/08/2008  . HYPERLIPIDEMIA-MIXED 01/08/2008  . OVERWEIGHT/OBESITY 01/08/2008  . RENAL DISEASE, CHRONIC 01/27/2009  . Edema 01/08/2008  . HYPERTENSION 01/27/2009  . DM type 2 (diabetes mellitus, type 2)   . Anemia   . Blood transfusion   . Bruises easily   . Blind     both eyes removed   . Family history of breast cancer     mother  . Renal failure   . Renal cell carcinoma    Past Surgical History  Procedure Date  . Venectomy   . Foot amputation     left - partial  . Graft in left arm 04/2010  . Kidney ablasion 04/2010  . Cholecystectomy 1992  . Enucleation     bilateral  . Vein strippings     bilateral legs, knee down. x1 on each. For varicose veins.   . Fistula in left arm   . Colonoscopy 06/14/2011    Procedure: COLONOSCOPY;  Surgeon: [name], MD;  Location: [facility] ENDOSCOPY;  Service: Endoscopy;  Laterality: N/A;  . Hot hemostasis 06/14/2011    Procedure: HOT HEMOSTASIS (ARGON PLASMA COAGULATION/BICAP);  Surgeon: [name], MD;  Location: [facility];  Service: Endoscopy;  Laterality: N/A;   Family History  Problem Relation Age of Onset  . Cancer    . Coronary artery disease    . Kidney disease    .  Breast cancer    . Ovarian cancer    . Lung cancer    . Diabetes    . Cancer Mother 66    breast, spine and ovarian  . Cancer Father 57    kidney, prostate  . Emphysema Father   . Cancer Sister     ovarian  . Cancer Brother     lung  . Malignant hyperthermia Neg Hx    Social History:  reports that he has never smoked. He has never used smokeless tobacco. He reports that he does not drink alcohol or use illicit drugs. Allergies  Allergen Reactions  . Codeine Other (See Comments)    Makes patient incoherent. STATES MAKES HIM COMATOSE  . Tape Other (See Comments)    Plastic tape tears skin off, please use paper tape instead.   Prior to Admission medications   Medication Sig Start Date End Date Taking? Authorizing Provider  aspirin EC 81 MG tablet Take 81 mg by mouth daily.   Yes Historical Provider, MD  atorvastatin (LIPITOR) 10 MG tablet Take 10 mg by mouth daily.   Yes Historical Provider, MD  b complex-vitamin c-folic acid (NEPHRO-VITE) 0.8 MG TABS Take 0.8 mg by mouth at bedtime.   Yes Historical Provider, MD  calcium acetate (PHOSLO) 667 MG capsule Take 1,334 mg by mouth 3 (three) times  daily with meals.    Yes Historical Provider, MD  docusate sodium (COLACE) 100 MG capsule Take 100 mg by mouth daily as needed. constipation   Yes Historical Provider, MD  finasteride (PROSCAR) 5 MG tablet Take 5 mg by mouth at bedtime.    Yes Historical Provider, MD  acetaminophen (TYLENOL) 325 MG tablet Take 650 mg by mouth every 6 (six) hours as needed. For pain    Historical Provider, MD   Current Facility-Administered Medications  Medication Dose Route Frequency Provider Last Rate Last Dose  . 0.9 %  sodium chloride infusion  250 mL Intravenous PRN Dayna N Dunn, PA      . acetaminophen (TYLENOL) tablet 650 mg  650 mg Oral Q4H PRN [name], MD      . aspirin 81 MG chewable tablet        81 mg at 09/27/11 0719  . aspirin EC tablet 81 mg  81 mg Oral Daily Dayna N Dunn, PA      .  atorvastatin (LIPITOR) tablet 10 mg  10 mg Oral q1800 Dayna N Dunn, PA      . calcium acetate (PHOSLO) capsule 1,334 mg  1,334 mg Oral TID WC Dayna N Dunn, PA      . docusate sodium (COLACE) capsule 100 mg  100 mg Oral Daily PRN Dayna N Dunn, PA      . finasteride (PROSCAR) tablet 5 mg  5 mg Oral QHS Dayna N Dunn, PA      . heparin 2-0.9 UNIT/ML-% infusion           . insulin aspart (novoLOG) injection 0-9 Units  0-9 Units Subcutaneous TID WC Dayna N Dunn, PA      . lidocaine (XYLOCAINE) 1 % injection           . multivitamin (RENA-VIT) tablet 1 tablet  1 tablet Oral Daily Dayna N Dunn, PA      . nitroGLYCERIN (NITROSTAT) SL tablet 0.4 mg  0.4 mg Sublingual Q5 Min x 3 PRN Dayna N Dunn, PA      . nitroGLYCERIN (NTG ON-CALL) 0.2 mg/mL injection           . ondansetron (ZOFRAN) injection 4 mg  4 mg Intravenous Q6H PRN [name], MD      . sodium chloride 0.9 % injection 3 mL  3 mL Intravenous Q12H Dayna N Dunn, PA      . sodium chloride 0.9 % injection 3 mL  3 mL Intravenous PRN Charlie Pitter, PA       Labs: Basic Metabolic Panel:  Lab [id number] 0852 09/21/11 1620  NA 141 141  K 3.7 4.1  CL 99 96  CO2 -- 33*  GLUCOSE 146* 215*  BUN 84* 47*  CREATININE 4.40* 3.8*  CALCIUM -- 9.6  ALB -- --  PHOS -- --   LCBC:  Lab 09/27/11 0852 09/21/11 1620  WBC -- 6.7  NEUTROABS -- 5.3  HGB 10.5* 12.7*  HCT 31.0* 38.1*  MCV -- 102.1*  PLT -- 149.0*   CBG:  Lab 09/27/11 1032 09/27/11 0739  GLUCAP 132* 139*   ROS: General: No weight loss, fever, chills. Energy and appetite are good. HEENT: No recent headaches, nasal bleeding,  visual changes, sore throat or dysphagia. + blind - diabetic retinopathy Neurologic: No dizziness, blackouts, seizures. No recent symptoms of stroke or mini- stroke. No recent episodes of slurred speech, or temporary blindness.  Cardiac: No recent episodes of chest pain/pressure,  shortness  of breath at rest or DOE.  Vascular: No history of rest pain in feet,  claudication, nonhealing ulcers  or DVT  Pulmonary: No home oxygen,no cough, hemoptysis, or wheezing  Musculoskeletal: no low back pain, or oint pain  Hematologic:No history of hypercoagulable state. No history of easy bleeding.  Gastrointestinal: No hematochezia or melena, No gastroesophageal reflux, no trouble swallowing  Urinary: Makes small amount of urine, no nurning with urination or frequency   Skin: No rashes or lesions Psychological: No  anxiety or depression  Physical Exam: Filed Vitals:   09/27/11 0723 09/27/11 0911 09/27/11 1205  BP: 128/79  135/58  Pulse: 76 61 62  Temp: 97 F (36.1 C)  97.7 F (36.5 C)  TempSrc: Oral  Oral  Resp: 18  16  Height: 6\' 4"  (1.93 m)    Weight: 86.183 kg (190 lb)    SpO2: 100%  100%     General: Well developed, well nourished, in no acute distress. Head: Normocephalic, atraumatic, sclera non-icteric, mucus membranes are moist; bilateral enucleation Neck: Supple. JVD not elevated. Lungs: Clear bilaterally to auscultation without wheezes, rales, or rhonchi. Breathing is unlabored. Heart: RRR with S1 S2. No murmurs, rubs, or gallops appreciated. Abdomen: Soft, non-tender, non-distended with normoactive bowel sounds. No rebound/guarding. No obvious abdominal masses. M-S:  Strength and tone appear normal for age; ambulates with rolling walker Lower extremities:without edema or ischemic changes, no open wounds; right mid foot amputation well healed Neuro: Alert and oriented X 3. Moves all extremities spontaneously. Psych:  Responds to questions appropriately with a normal affect. Dialysis Access: left lower AVF + bruit, softer at proximal buttonhole.  Dialysis Orders: Center: [facility]  On MWF. EDW 87.5 HD Bath 2K 2.25 Ca Time 4 Heparin 8000 Access left lower AVF BFR 400 DFR 800    Zemplar 1 mcg IV/HD Epogen 1800 was d/c 6/25   Units IV/HD  Infed  50/Wed  Other profile 2  Assessment/Plan: 1. 3 vessel CAD found incidently as part of transplant work  up - for surgical consult; no prior hx of CP or SOB 2. ESRD -  MWF - can adjust if needed around surgery 3. Hypertension/volume  - wt gains usually 3 - 4 kg; no BP meds 4. Anemia  -ESA recently d/c weekly Fe; follow Hgb 10.5 on  H/H; recheck pre HD; last outpt Hgb was 12.9 on 6/19 5. Metabolic bone disease -  Continue same phoslo and zemplar 1 /HD 6. Nutrition -Changed to high protein renal diet 7. Hx elevated PSA -  Prostate bx in August (hx of prostate cancer) 8. Hx bilateral RCC s/p radioablation  Myriam Jacobson, PA-C [place] [phone] 09/27/2011, 12:58 PM   I have seen and examined this patient and agree with plan as outlined above.  Pt is very functional despite his blindness and chronic illnesses.  He has lost 150 pounds over the last 3 years with diet and exercise and has participated in several handicapped races.  He also exercises on a daily basis and should do well post operatively. Delonte Musich A,MD 09/27/2011 3:07 PM

## 2011-09-28 ENCOUNTER — Ambulatory Visit (HOSPITAL_COMMUNITY): Payer: 59

## 2011-09-28 ENCOUNTER — Telehealth: Payer: Self-pay | Admitting: Cardiology

## 2011-09-28 DIAGNOSIS — I2510 Atherosclerotic heart disease of native coronary artery without angina pectoris: Secondary | ICD-10-CM

## 2011-09-28 DIAGNOSIS — Z01810 Encounter for preprocedural cardiovascular examination: Secondary | ICD-10-CM

## 2011-09-28 LAB — PHOSPHORUS: Phosphorus: 6.6 mg/dL — ABNORMAL HIGH (ref 2.3–4.6)

## 2011-09-28 LAB — RENAL FUNCTION PANEL
Albumin: 3.5 g/dL (ref 3.5–5.2)
BUN: 101 mg/dL — ABNORMAL HIGH (ref 6–23)
CO2: 27 mEq/L (ref 19–32)
Calcium: 9.6 mg/dL (ref 8.4–10.5)
Chloride: 95 mEq/L — ABNORMAL LOW (ref 96–112)
Creatinine, Ser: 6.21 mg/dL — ABNORMAL HIGH (ref 0.50–1.35)
GFR calc Af Amer: 10 mL/min — ABNORMAL LOW (ref >90)
GFR calc non Af Amer: 9 mL/min — ABNORMAL LOW (ref >90)
Glucose, Bld: 136 mg/dL — ABNORMAL HIGH (ref 70–99)
Phosphorus: 6.7 mg/dL — ABNORMAL HIGH (ref 2.3–4.6)
Potassium: 4.3 mEq/L (ref 3.5–5.1)
Sodium: 139 mEq/L (ref 135–145)

## 2011-09-28 LAB — BLOOD GAS, ARTERIAL
Acid-Base Excess: 5.6 mmol/L — ABNORMAL HIGH (ref 0.0–2.0)
Bicarbonate: 29.4 mEq/L — ABNORMAL HIGH (ref 20.0–24.0)
Patient temperature: 98.6
TCO2: 30.6 mmol/L (ref 0–100)

## 2011-09-28 LAB — LIPID PANEL
LDL Cholesterol: 32 mg/dL (ref 0–99)
Triglycerides: 75 mg/dL (ref <150)
VLDL: 15 mg/dL (ref 0–40)

## 2011-09-28 LAB — PULMONARY FUNCTION TEST

## 2011-09-28 LAB — COMPREHENSIVE METABOLIC PANEL
ALT: 35 U/L (ref 0–53)
Alkaline Phosphatase: 83 U/L (ref 39–117)
BUN: 98 mg/dL — ABNORMAL HIGH (ref 6–23)
Chloride: 97 mEq/L (ref 96–112)
GFR calc Af Amer: 10 mL/min — ABNORMAL LOW (ref >90)
GFR calc non Af Amer: 9 mL/min — ABNORMAL LOW (ref >90)
Total Bilirubin: 0.4 mg/dL (ref 0.3–1.2)

## 2011-09-28 LAB — SURGICAL PCR SCREEN: Staphylococcus aureus: NEGATIVE

## 2011-09-28 LAB — CBC
MCH: 34.2 pg — ABNORMAL HIGH (ref 26.0–34.0)
MCV: 98.5 fL (ref 78.0–100.0)
Platelets: 106 10*3/uL — ABNORMAL LOW (ref 150–400)
RDW: 13.1 % (ref 11.5–15.5)
WBC: 4.7 10*3/uL (ref 4.0–10.5)

## 2011-09-28 LAB — PROTIME-INR
INR: 1.11 (ref 0.00–1.49)
Prothrombin Time: 14.5 seconds (ref 11.6–15.2)

## 2011-09-28 MED ORDER — VANCOMYCIN HCL 1000 MG IV SOLR
1500.0000 mg | INTRAVENOUS | Status: AC
  Administered 2011-09-29: 1500 mg via INTRAVENOUS
  Filled 2011-09-28: qty 1500

## 2011-09-28 MED ORDER — CHLORHEXIDINE GLUCONATE 4 % EX LIQD
60.0000 mL | Freq: Once | CUTANEOUS | Status: AC
  Administered 2011-09-29: 4 via TOPICAL
  Filled 2011-09-28 (×2): qty 60

## 2011-09-28 MED ORDER — TRANEXAMIC ACID (OHS) PUMP PRIME SOLUTION
2.0000 mg/kg | INTRAVENOUS | Status: DC
  Filled 2011-09-28: qty 1.76

## 2011-09-28 MED ORDER — TRANEXAMIC ACID (OHS) BOLUS VIA INFUSION
15.0000 mg/kg | INTRAVENOUS | Status: AC
  Administered 2011-09-29: 1320 mg via INTRAVENOUS
  Filled 2011-09-28: qty 1320

## 2011-09-28 MED ORDER — PHENYLEPHRINE HCL 10 MG/ML IJ SOLN
30.0000 ug/min | INTRAVENOUS | Status: AC
  Administered 2011-09-29: 40 ug/min via INTRAVENOUS
  Filled 2011-09-28: qty 2

## 2011-09-28 MED ORDER — POTASSIUM CHLORIDE 2 MEQ/ML IV SOLN
80.0000 meq | INTRAVENOUS | Status: DC
  Filled 2011-09-28: qty 40

## 2011-09-28 MED ORDER — PARICALCITOL 5 MCG/ML IV SOLN
INTRAVENOUS | Status: AC
  Filled 2011-09-28: qty 1

## 2011-09-28 MED ORDER — DEXTROSE 5 % IV SOLN
750.0000 mg | INTRAVENOUS | Status: DC
  Filled 2011-09-28: qty 750

## 2011-09-28 MED ORDER — SODIUM BICARBONATE 8.4 % IV SOLN
INTRAVENOUS | Status: AC
  Administered 2011-09-29: 10:00:00
  Filled 2011-09-28: qty 2.5

## 2011-09-28 MED ORDER — DEXTROSE 5 % IV SOLN
0.5000 ug/min | INTRAVENOUS | Status: DC
  Filled 2011-09-28: qty 4

## 2011-09-28 MED ORDER — DIPHENHYDRAMINE HCL 25 MG PO CAPS: 25.0000 mg | ORAL_CAPSULE | Freq: Four times a day (QID) | ORAL | Status: DC | PRN

## 2011-09-28 MED ORDER — SODIUM CHLORIDE 0.9 % IV SOLN
INTRAVENOUS | Status: AC
  Administered 2011-09-29: 1 [IU]/h via INTRAVENOUS
  Filled 2011-09-28: qty 1

## 2011-09-28 MED ORDER — SODIUM CHLORIDE 0.9 % IV SOLN
0.1000 ug/kg/h | INTRAVENOUS | Status: AC
  Administered 2011-09-29: .2 ug/kg/h via INTRAVENOUS
  Filled 2011-09-28: qty 4

## 2011-09-28 MED ORDER — TEMAZEPAM 15 MG PO CAPS: 15.0000 mg | ORAL_CAPSULE | Freq: Once | ORAL | Status: AC | PRN

## 2011-09-28 MED ORDER — DEXTROSE 5 % IV SOLN
1.5000 g | INTRAVENOUS | Status: AC
  Administered 2011-09-29: .75 g via INTRAVENOUS
  Administered 2011-09-29: 1.5 g via INTRAVENOUS
  Filled 2011-09-28: qty 1.5

## 2011-09-28 MED ORDER — NITROGLYCERIN IN D5W 200-5 MCG/ML-% IV SOLN
2.0000 ug/min | INTRAVENOUS | Status: AC
  Administered 2011-09-29: 5 ug/min via INTRAVENOUS
  Filled 2011-09-28: qty 250

## 2011-09-28 MED ORDER — DOPAMINE-DEXTROSE 3.2-5 MG/ML-% IV SOLN
2.0000 ug/kg/min | INTRAVENOUS | Status: DC
  Filled 2011-09-28: qty 250

## 2011-09-28 MED ORDER — TRANEXAMIC ACID 100 MG/ML IV SOLN
1.5000 mg/kg/h | INTRAVENOUS | Status: AC
  Administered 2011-09-29: 1 mg/kg/h via INTRAVENOUS
  Filled 2011-09-28: qty 25

## 2011-09-28 MED ORDER — HYDROXYZINE HCL 25 MG PO TABS
ORAL_TABLET | ORAL | Status: AC
  Filled 2011-09-28: qty 1

## 2011-09-28 MED ORDER — BISACODYL 5 MG PO TBEC
5.0000 mg | DELAYED_RELEASE_TABLET | Freq: Once | ORAL | Status: DC
  Filled 2011-09-28: qty 1

## 2011-09-28 MED ORDER — METOPROLOL TARTRATE 12.5 MG HALF TABLET
12.5000 mg | ORAL_TABLET | Freq: Once | ORAL | Status: AC
  Administered 2011-09-29: 12.5 mg via ORAL
  Filled 2011-09-28: qty 1

## 2011-09-28 MED ORDER — DIAZEPAM 5 MG PO TABS: 5.0000 mg | ORAL_TABLET | Freq: Once | ORAL | Status: DC

## 2011-09-28 MED ORDER — MAGNESIUM SULFATE 50 % IJ SOLN
40.0000 meq | INTRAMUSCULAR | Status: DC
  Filled 2011-09-28: qty 10

## 2011-09-28 NOTE — Progress Notes (Signed)
PFT completed. Unconfirmed results sent via tube station for inclusion in Shadow Chart

## 2011-09-28 NOTE — Progress Notes (Signed)
Patient ID: [name], male   DOB: 05/24/1950, 61 y.o.   MRN: [id number]  [facility] Progress Note    Subjective:   Patient was seen on dialysis and the procedure was supervised. BFR 400 Via Left forearm AVF BP is 95/71.  Patient appears to be tolerating treatment well    Objective:   BP 121/70  Pulse 63  Temp 98.2 F (36.8 C) (Oral)  Resp 13  Ht 6\' 4"  (1.93 m)  Wt 90.175 kg (198 lb 12.8 oz)  BMI 24.20 kg/m2  SpO2 99%  Physical Exam: Gen:WD WN WM in NAD CVS:RRR Resp:CTA [id number] Ext:no edema, AVF +T/B  Labs: BMET  Lab 09/28/11 0818 09/28/11 0515 09/27/11 0852 09/21/11 1620  NA 139 139 141 141  K 4.3 4.0 3.7 4.1  CL 95* 97 99 96  CO2 27 28 -- 33*  GLUCOSE 136* 119* 146* 215*  BUN 101* 98* 84* 47*  CREATININE 6.21* 6.02* 4.40* 3.8*  ALBUMIN 3.5 3.5 -- --  CALCIUM 9.6 9.7 -- 9.6  PHOS 6.7* 6.6* -- --   CBC  Lab 09/28/11 0515 09/27/11 0852 09/21/11 1620  WBC 4.7 -- 6.7  NEUTROABS -- -- 5.3  HGB 11.1* 10.5* 12.7*  HCT 32.0* 31.0* 38.1*  MCV 98.5 -- 102.1*  PLT 106* -- 149.0*    @IMGRELPRIORS @ Medications:      . aspirin EC  81 mg Oral Daily  . atorvastatin  10 mg Oral QHS  . calcium acetate  1,334 mg Oral TID WC  . finasteride  5 mg Oral QHS  . hydrOXYzine      . insulin aspart  0-9 Units Subcutaneous TID WC  . iron dextran complex  50 mg Intravenous Q Wed-HD  . multivitamin  1 tablet Oral QHS  . paricalcitol      . paricalcitol  1 mcg Intravenous 3 times weekly  . DISCONTD: aspirin  81 mg Oral Pre-Cath  . DISCONTD: atorvastatin  10 mg Oral q1800  . DISCONTD: calcium acetate  1,334 mg Oral TID WC  . DISCONTD: multivitamin  1 tablet Oral Daily  . DISCONTD: sodium chloride  3 mL Intravenous Q12H  . DISCONTD: sodium chloride  3 mL Intravenous Q12H     Assessment/ Plan:   1. 3 vessel CAD with preserved LV function in pt with DM and ESRD- pt has a surprisingly excellent functional status and works out 3-5 hours daily without  symptoms.  Awaiting CT surgery evaluation for possible CABG.  This was a pretransplant clearance cardiac cath 2. ESRDcont with HD qMWF 3. Anemia:stable 4. DM- stable 5. Nutrition:stable 6. Hypertension:low BP, will decrease UF with HD today  Laylamarie Meuser A 09/28/2011, 9:29 AM

## 2011-09-28 NOTE — Progress Notes (Signed)
This was a follow-up visit.  Pt was not in the room, but his wife and other family was present.  Velva Harman is concerned for her husband.  She just found out they are planning his surgery for tomorrow morning.  I offered emotional support and will continue to follow. [name]  [phone]  Personal pager  09/28/11 1542  Clinical Encounter Type  Visited With Family  Visit Type Follow-up;Spiritual support  Spiritual Encounters  Spiritual Needs Emotional  Stress Factors  Patient Stress Factors Loss of control;Major life changes;Health changes  Family Stress Factors Major life changes;Loss of control;Lack of knowledge;Family relationships

## 2011-09-28 NOTE — Consult Note (Signed)
[address]            [place] [address]          [phone]      Reason for Consult: Severe multivessel coronary disease Referring Physician:  Dr. Bing Quarry   HPI: [name] is a 61 y.o. male who presented to [facility] for cardiac cath to evaluate findings of an abnormal nuclear stress test. He has a hx of HTN, diet controlled DM2, HL, ESRD, renal carcinoma and previously dx non-obstructive CAD. Last LHC 5/03: mLAD 70%. Last nuclear study 7/08: no ischemia, EF 47%. He is in the process of being eval for renal transplant at [facility]. Myoview 08/30/11 at [facility]: No scar, + mild apical, apical lateral, mid anterolateral ischemia, EF 49%, coronary calcification in LAD, RCA and CFX. Cardiac catheterization now shows 80% proximal LAD stenosis with a diffusely calcified proximal LAD. The left circumflex had 95% proximal stenosis. The right coronary artery has 40-50% nonobstructive disease.  Past Medical History  Diagnosis Date  . CAD, NATIVE VESSEL 01/08/2008  . HYPERLIPIDEMIA-MIXED 01/08/2008  . OVERWEIGHT/OBESITY 01/08/2008  . Edema 01/08/2008  . HYPERTENSION 01/27/2009  . Anemia   . Blood transfusion ~ 2003  . Bruises easily   . Blind     both eyes removed   . Family history of breast cancer     mother  . Cellulitis late 1980's    "hospitalized; wrapped both legs; several times; no OR for this"  . Peripheral vascular disease   . Renal failure   . ESRD (end stage renal disease) on dialysis     09/27/11 [facility]; Mon, Wed, Fri  . Pneumonia 2000;s  . DM type 2 (diabetes mellitus, type 2)   . Renal cell carcinoma     "both kidneys"    Past Surgical History  Procedure Date  . Venectomy 1980's    "knees down; both legs; 2 separate times  . Foot amputation ~ 2002    right;  partial; "infection"  . Cholecystectomy 1992  . Colonoscopy 06/14/2011    Procedure: COLONOSCOPY;  Surgeon: [name], MD;  Location: [facility] ENDOSCOPY;   Service: Endoscopy;  Laterality: N/A;  . Hot hemostasis 06/14/2011    Procedure: HOT HEMOSTASIS (ARGON PLASMA COAGULATION/BICAP);  Surgeon: [name], MD;  Location: [facility];  Service: Endoscopy;  Laterality: N/A;  . Av fistula placement 02/2010    LFA  . Varicose vein surgery mid 1980's    BLE  . Radiofrequency ablation kidney 2010-2012    "twice; one on each side; for cancer"  . Enucleation 2003; 2006    bilateral; "diabetes; pain"  . Cardiac catheterization ~ 2001    Family History  Problem Relation Age of Onset  . Cancer    . Coronary artery disease    . Kidney disease    . Breast cancer    . Ovarian cancer    . Lung cancer    . Diabetes    . Cancer Mother 53    breast, spine and ovarian  . Cancer Father 44    kidney, prostate  . Emphysema Father   . Cancer Sister     ovarian  . Cancer Brother     lung  . Malignant hyperthermia Neg Hx     Social History:  reports that he has never smoked. He has never used smokeless  tobacco. He reports that he does not drink alcohol or use illicit drugs.  Allergies:  Allergies  Allergen Reactions  . Codeine Other (See Comments)    Makes patient incoherent. STATES MAKES HIM COMATOSE  . Tape Other (See Comments)    Plastic tape tears skin off, please use paper tape instead.    Medications:  I have reviewed the patient's current medications. Prior to Admission:  Prescriptions prior to admission  Medication Sig Dispense Refill  . aspirin EC 81 MG tablet Take 81 mg by mouth daily.      [name] atorvastatin (LIPITOR) 10 MG tablet Take 10 mg by mouth daily.      [name] b complex-vitamin c-folic acid (NEPHRO-VITE) 0.8 MG TABS Take 0.8 mg by mouth at bedtime.      . calcium acetate (PHOSLO) 667 MG capsule Take 1,334 mg by mouth 3 (three) times daily with meals.       . docusate sodium (COLACE) 100 MG capsule Take 100 mg by mouth daily as needed. constipation      . finasteride (PROSCAR) 5 MG tablet Take 5 mg by mouth at bedtime.        [name] acetaminophen (TYLENOL) 325 MG tablet Take 650 mg by mouth every 6 (six) hours as needed. For pain       Scheduled:   . aspirin EC  81 mg Oral Daily  . atorvastatin  10 mg Oral QHS  . bisacodyl  5 mg Oral Once  . calcium acetate  1,334 mg Oral TID WC  . cefUROXime (ZINACEF)  IV  750 mg Intravenous To OR  . chlorhexidine  60 mL Topical Once  . dexmedetomidine (PRECEDEX) IV infusion for high rates  0.1-0.7 mcg/kg/hr Intravenous To OR  . diazepam  5 mg Oral Once  . DOPamine  2-20 mcg/kg/min Intravenous To OR  . epinephrine  0.5-20 mcg/min Intravenous To OR  . finasteride  5 mg Oral QHS  . hydrOXYzine      . insulin aspart  0-9 Units Subcutaneous TID WC  . insulin (NOVOLIN-R) infusion   Intravenous To OR  . iron dextran complex  50 mg Intravenous Q Wed-HD  . magnesium sulfate  40 mEq Other To OR  . metoprolol tartrate  12.5 mg Oral Once  . multivitamin  1 tablet Oral QHS  . nitroGLYCERIN  2-200 mcg/min Intravenous To OR  . nitroglycerin-nicardipine-HEPARIN-sodium bicarbonate irrigation for artery spasm   Irrigation To OR  . paricalcitol      . paricalcitol  1 mcg Intravenous 3 times weekly  . phenylephrine (NEO-SYNEPHRINE) Adult infusion  30-200 mcg/min Intravenous To OR  . potassium chloride  80 mEq Other To OR  . tranexamic acid  15 mg/kg Intravenous To OR  . tranexamic acid  2 mg/kg Intracatheter To OR  . tranexamic acid (CYKLOKAPRON) infusion ([facility])  1.5 mg/kg/hr Intravenous To OR  . DISCONTD: atorvastatin  10 mg Oral q1800  . DISCONTD: multivitamin  1 tablet Oral Daily   Continuous:  [id number] chloride, acetaminophen, acetaminophen, alteplase, calcium carbonate (dosed in mg elemental calcium), camphor-menthol, diphenhydrAMINE, docusate sodium, feeding supplement (NEPRO CARB STEADY), heparin, heparin, hydrOXYzine, lidocaine, lidocaine-prilocaine, nitroGLYCERIN, ondansetron (ZOFRAN) IV, ondansetron, pentafluoroprop-tetrafluoroeth, sorbitol, temazepam, DISCONTD:  zolpidem  Results for orders placed during the hospital encounter of 09/27/11 (from the past 48 hour(s))  GLUCOSE, CAPILLARY     Status: Abnormal   Collection Time   09/27/11  7:39 AM      Component Value Range Comment   Glucose-Capillary 139 (*) 70 -  99 mg/dL   POCT I-STAT, CHEM 8     Status: Abnormal   Collection Time   09/27/11  8:52 AM      Component Value Range Comment   Sodium 141  135 - 145 mEq/L    Potassium 3.7  3.5 - 5.1 mEq/L    Chloride 99  96 - 112 mEq/L    BUN 84 (*) 6 - 23 mg/dL    Creatinine, Ser 4.40 (*) 0.50 - 1.35 mg/dL    Glucose, Bld 146 (*) 70 - 99 mg/dL    Calcium, Ion 1.16  1.12 - 1.32 mmol/L    TCO2 28  0 - 100 mmol/L    Hemoglobin 10.5 (*) 13.0 - 17.0 g/dL    HCT 31.0 (*) 39.0 - 52.0 %   GLUCOSE, CAPILLARY     Status: Abnormal   Collection Time   09/27/11 10:32 AM      Component Value Range Comment   Glucose-Capillary 132 (*) 70 - 99 mg/dL   GLUCOSE, CAPILLARY     Status: Normal   Collection Time   09/27/11  4:35 PM      Component Value Range Comment   Glucose-Capillary 98  70 - 99 mg/dL    Comment 1 Notify RN     GLUCOSE, CAPILLARY     Status: Abnormal   Collection Time   09/27/11  9:43 PM      Component Value Range Comment   Glucose-Capillary 183 (*) 70 - 99 mg/dL    Comment 1 Notify RN     CBC     Status: Abnormal   Collection Time   09/28/11  5:15 AM      Component Value Range Comment   WBC 4.7  4.0 - 10.5 K/uL    RBC 3.25 (*) 4.22 - 5.81 MIL/uL    Hemoglobin 11.1 (*) 13.0 - 17.0 g/dL    HCT 32.0 (*) 39.0 - 52.0 %    MCV 98.5  78.0 - 100.0 fL    MCH 34.2 (*) 26.0 - 34.0 pg    MCHC 34.7  30.0 - 36.0 g/dL    RDW 13.1  11.5 - 15.5 %    Platelets 106 (*) 150 - 400 K/uL CONSISTENT WITH PREVIOUS RESULT  COMPREHENSIVE METABOLIC PANEL     Status: Abnormal   Collection Time   09/28/11  5:15 AM      Component Value Range Comment   Sodium 139  135 - 145 mEq/L    Potassium 4.0  3.5 - 5.1 mEq/L    Chloride 97  96 - 112 mEq/L    CO2 28  19 - 32  mEq/L    Glucose, Bld 119 (*) 70 - 99 mg/dL    BUN 98 (*) 6 - 23 mg/dL    Creatinine, Ser 6.02 (*) 0.50 - 1.35 mg/dL    Calcium 9.7  8.4 - 10.5 mg/dL    Total Protein 6.2  6.0 - 8.3 g/dL    Albumin 3.5  3.5 - 5.2 g/dL    AST 16  0 - 37 U/L    ALT 35  0 - 53 U/L    Alkaline Phosphatase 83  39 - 117 U/L    Total Bilirubin 0.4  0.3 - 1.2 mg/dL    GFR calc non Af Amer 9 (*) >90 mL/min    GFR calc Af Amer 10 (*) >90 mL/min   PHOSPHORUS     Status: Abnormal   Collection Time   09/28/11  5:15 AM      Component Value Range Comment   Phosphorus 6.6 (*) 2.3 - 4.6 mg/dL   LIPID PANEL     Status: Normal   Collection Time   09/28/11  5:15 AM      Component Value Range Comment   Cholesterol 120  0 - 200 mg/dL    Triglycerides 75  <150 mg/dL    HDL 73  >39 mg/dL    Total CHOL/HDL Ratio 1.6      VLDL 15  0 - 40 mg/dL    LDL Cholesterol 32  0 - 99 mg/dL   RENAL FUNCTION PANEL     Status: Abnormal   Collection Time   09/28/11  8:18 AM      Component Value Range Comment   Sodium 139  135 - 145 mEq/L    Potassium 4.3  3.5 - 5.1 mEq/L    Chloride 95 (*) 96 - 112 mEq/L    CO2 27  19 - 32 mEq/L    Glucose, Bld 136 (*) 70 - 99 mg/dL    BUN 101 (*) 6 - 23 mg/dL    Creatinine, Ser 6.21 (*) 0.50 - 1.35 mg/dL    Calcium 9.6  8.4 - 10.5 mg/dL    Phosphorus 6.7 (*) 2.3 - 4.6 mg/dL    Albumin 3.5  3.5 - 5.2 g/dL    GFR calc non Af Amer 9 (*) >90 mL/min    GFR calc Af Amer 10 (*) >90 mL/min   GLUCOSE, CAPILLARY     Status: Normal   Collection Time   09/28/11 12:55 PM      Component Value Range Comment   Glucose-Capillary 87  70 - 99 mg/dL    Comment 1 Notify RN       No results found.  Review of Systems  Constitutional: Negative.   HENT: Negative.   Eyes:       Blind   Respiratory: Negative.   Cardiovascular: Negative for chest pain, palpitations, orthopnea, leg swelling and PND.  Gastrointestinal: Negative.   Genitourinary: Negative.   Musculoskeletal: Negative.   Skin: Negative.    Neurological: Negative.   Endo/Heme/Allergies: Negative.   Psychiatric/Behavioral: Negative.    Blood pressure 129/77, pulse 66, temperature 98.5 F (36.9 C), temperature source Oral, resp. rate 14, height 6\' 4"  (1.93 m), weight 88 kg (194 lb 0.1 oz), SpO2 99.00%. Physical Exam  Constitutional: He is oriented to person, place, and time. He appears well-developed and well-nourished.  HENT:  Head: Normocephalic and atraumatic.  Mouth/Throat: Oropharynx is clear and moist.  Neck: Neck supple. No JVD present. No thyromegaly present.  Cardiovascular: Normal rate, regular rhythm, normal heart sounds and intact distal pulses.  Exam reveals no gallop and no friction rub.   No murmur heard. Respiratory: Effort normal and breath sounds normal. No respiratory distress. He has no wheezes. He has no rales.  GI: Soft. Bowel sounds are normal. He exhibits no distension and no mass. There is no tenderness.  Musculoskeletal: He exhibits no edema.  Lymphadenopathy:    He has no cervical adenopathy.  Neurological: He is alert and oriented to person, place, and time. He has normal strength. No sensory deficit.  Skin: Skin is warm and dry.  Psychiatric: He has a normal mood and affect.   Cardiac Cath:  AO 131/72 (96) LV 141/18  Gradient hard to measure              Coronary angiography: Coronary dominance: right  All  three arteries are heavily calcified.    Left mainstem: No significant obstruction  Left anterior descending (LAD): Heavily calcified vessel throughout.  Heavily calcified 80% proximal lesion followed by 70% just after the diagonal.  The vessel is a typical diabetic artery. There is a 60% mid lesion.  The apical LAD is a graftable vessel.  The diagonal is relatively small and has ostial 5 0% narrowing.     Left circumflex (LCx): THere is a small ramus with diffuse irregularity.  The circumflex supplies a very large OM with 95% proximal narrowing.  There is modest bifurcation, non  obstructive plaque with about 30-40% involvement in the large superior branch of the OM.     Right coronary artery (RCA): Heavily calcified vessel with diffuse, segmental, calcified disease throughout the proximal vessel.  There is 30-40% mid disease.  The PDA has a long segmental area of 60% narrowing throughout the proximal segment.  The PLA 1 has about 50-60% ostial narrowing.  The remainder of the PLA system is ok.     Left ventriculography: Left ventricular systolic function is normal, LVEF is estimated at 55-65%, there is no significant mitral regurgitation   Final Conclusions:   1.  Severe diabetic three vessel disease with critical disease of the LAD and circumflex 2.  Preserved LV function 3.  ESRD and diabetes.  Assessment/Plan:  He has high-grade stenosis in the LAD and left circumflex territories with an abnormal nuclear stress test. I agree that coronary bypass graft surgery is the best treatment to prevent further ischemia and infarction and improve his long-term prognosis. He has had bilateral lower leg vein stripping but may have some suitable saphenous vein in the thighs. I would plan to use a left internal mammary artery graft to the LAD and  saphenous vein for the left circumflex. If there is no suitable vein then I  plan to use his right internal mammary artery graft. Ideally I would not like to use both internal mammary artery grafts due to the increased risk of sternal necrosis with his history of diabetes. The right coronary artery has no significant stenosis and I would plan to leave that artery alone. I discussed the operative procedure with the patient and family including alternatives, benefits and risks; including but not limited to bleeding, blood transfusion, infection, stroke, myocardial infarction, graft failure, heart block requiring a permanent pacemaker, organ dysfunction, and death.  Arthas Vasil Ramp understands and agrees to proceed.  We will schedule surgery for  tomorrow morning.  [name] 09/28/2011, 5:36 PM

## 2011-09-28 NOTE — Telephone Encounter (Signed)
Pt's wife calling re what's going with pt is at [facility], pls call

## 2011-09-28 NOTE — Progress Notes (Signed)
HPI:  Patient is awaiting surgery visit.  He has had prior vein stripping, by Dr. Linus Mako, many years ago.  He is diabetic, has ESRD on dialysis.  No chest pain.  He is in dialysis today.  In dialysis, he is comfortable. Groin is stable.    Current Facility-Administered Medications  Medication Dose Route Frequency Provider Last Rate Last Dose  . 0.9 %  sodium chloride infusion  100 mL Intravenous PRN Myriam Jacobson, PA      . acetaminophen (TYLENOL) tablet 650 mg  650 mg Oral Q6H PRN Myriam Jacobson, PA       Or  . acetaminophen (TYLENOL) suppository 650 mg  650 mg Rectal Q6H PRN Myriam Jacobson, PA      . alteplase (CATHFLO ACTIVASE) injection 2 mg  2 mg Intracatheter Once PRN Myriam Jacobson, PA      . aspirin EC tablet 81 mg  81 mg Oral Daily Dayna N Dunn, PA      . atorvastatin (LIPITOR) tablet 10 mg  10 mg Oral QHS [name], MD   10 mg at 09/27/11 2202  . calcium acetate (PHOSLO) capsule 1,334 mg  1,334 mg Oral TID WC [name], MD   1,334 mg at 09/27/11 1724  . calcium carbonate (dosed in mg elemental calcium) suspension 500 mg of elemental calcium  500 mg of elemental calcium Oral Q6H PRN Myriam Jacobson, PA      . camphor-menthol [facility]) lotion 1 application  1 application Topical [id number] PRN Myriam Jacobson, PA       And  . hydrOXYzine (ATARAX/VISTARIL) tablet 25 mg  25 mg Oral Q8H PRN Myriam Jacobson, PA   25 mg at 09/28/11 0909  . diphenhydrAMINE (BENADRYL) capsule 25 mg  25 mg Oral Q6H PRN Myriam Jacobson, PA      . docusate sodium [facility]) enema 283 mg  1 enema Rectal PRN Myriam Jacobson, PA      . feeding supplement (NEPRO CARB STEADY) liquid 237 mL  237 mL Oral PRN Myriam Jacobson, PA      . finasteride (PROSCAR) tablet 5 mg  5 mg Oral QHS Dayna N Dunn, PA   5 mg at 09/27/11 2202  . heparin injection 1,000 Units  1,000 Units Dialysis PRN Myriam Jacobson, PA      . heparin injection 1,700 Units  20 Units/kg Dialysis PRN Myriam Jacobson,  PA      . hydrOXYzine (ATARAX/VISTARIL) 25 MG tablet           . insulin aspart (novoLOG) injection 0-9 Units  0-9 Units Subcutaneous TID WC Dayna N Dunn, PA      . iron dextran complex (INFED) injection 50 mg  50 mg Intravenous Q Wed-HD Myriam Jacobson, PA      . lidocaine (XYLOCAINE) 1 % injection 5 mL  5 mL Intradermal PRN Myriam Jacobson, PA      . lidocaine-prilocaine (EMLA) cream 1 application  1 application Topical PRN Myriam Jacobson, PA      . multivitamin (RENA-VIT) tablet 1 tablet  1 tablet Oral QHS [name], MD   1 tablet at 09/27/11 2202  . nitroGLYCERIN (NITROSTAT) SL tablet 0.4 mg  0.4 mg Sublingual Q5 Min x 3 PRN Dayna N Dunn, PA      . ondansetron (ZOFRAN) tablet 4 mg  4 mg Oral Q6H PRN Myriam Jacobson, PA  Or  . ondansetron (ZOFRAN) injection 4 mg  4 mg Intravenous Q6H PRN Myriam Jacobson, PA      . paricalcitol (ZEMPLAR) 5 MCG/ML injection           . paricalcitol (ZEMPLAR) injection 1 mcg  1 mcg Intravenous 3 times weekly Myriam Jacobson, PA   1 mcg at 09/28/11 0840  . pentafluoroprop-tetrafluoroeth (GEBAUERS) aerosol 1 application  1 application Topical PRN Myriam Jacobson, PA      . sorbitol 70 % solution 30 mL  30 mL Oral PRN Myriam Jacobson, PA      . zolpidem (AMBIEN) tablet 5 mg  5 mg Oral QHS PRN Myriam Jacobson, PA      . DISCONTD: 0.9 %  sodium chloride infusion  250 mL Intravenous PRN Liliane Shi, PA      . DISCONTD: 0.9 %  sodium chloride infusion   Intravenous Continuous Liliane Shi, PA 50 mL/hr at 09/27/11 0729 1,000 mL at 09/27/11 0729  . DISCONTD: 0.9 %  sodium chloride infusion  250 mL Intravenous PRN Charlie Pitter, PA      . DISCONTD: acetaminophen (TYLENOL) tablet 650 mg  650 mg Oral Q4H PRN [name], MD      . DISCONTD: aspirin chewable tablet 81 mg  81 mg Oral Pre-Cath Liliane Shi, [place]      . DISCONTD: atorvastatin (LIPITOR) tablet 10 mg  10 mg Oral q1800 Dayna N Dunn, PA      . DISCONTD: calcium acetate  (PHOSLO) capsule 1,334 mg  1,334 mg Oral TID WC Dayna N Dunn, PA      . DISCONTD: docusate sodium (COLACE) capsule 100 mg  100 mg Oral Daily PRN Charlie Pitter, PA      . DISCONTD: multivitamin (RENA-VIT) tablet 1 tablet  1 tablet Oral Daily Dayna N Dunn, PA      . DISCONTD: ondansetron (ZOFRAN) injection 4 mg  4 mg Intravenous Q6H PRN [name], MD      . DISCONTD: sodium chloride 0.9 % injection 3 mL  3 mL Intravenous Q12H Liliane Shi, PA      . DISCONTD: sodium chloride 0.9 % injection 3 mL  3 mL Intravenous PRN Liliane Shi, PA      . DISCONTD: sodium chloride 0.9 % injection 3 mL  3 mL Intravenous Q12H Dayna N Dunn, PA      . DISCONTD: sodium chloride 0.9 % injection 3 mL  3 mL Intravenous PRN Charlie Pitter, PA        Allergies  Allergen Reactions  . Codeine Other (See Comments)    Makes patient incoherent. STATES MAKES HIM COMATOSE  . Tape Other (See Comments)    Plastic tape tears skin off, please use paper tape instead.    Past Medical History  Diagnosis Date  . CAD, NATIVE VESSEL 01/08/2008  . HYPERLIPIDEMIA-MIXED 01/08/2008  . OVERWEIGHT/OBESITY 01/08/2008  . Edema 01/08/2008  . HYPERTENSION 01/27/2009  . Anemia   . Blood transfusion ~ 2003  . Bruises easily   . Blind     both eyes removed   . Family history of breast cancer     mother  . Cellulitis late 1980's    "hospitalized; wrapped both legs; several times; no OR for this"  . Peripheral vascular disease   . Renal failure   . ESRD (end stage renal disease) on dialysis     09/27/11 [facility]; [place], Wed,  Fri  . Pneumonia 2000;s  . DM type 2 (diabetes mellitus, type 2)   . Renal cell carcinoma     "both kidneys"    Past Surgical History  Procedure Date  . Venectomy 1980's    "knees down; both legs; 2 separate times  . Foot amputation ~ 2002    right;  partial; "infection"  . Cholecystectomy 1992  . Colonoscopy 06/14/2011    Procedure: COLONOSCOPY;  Surgeon: [name], MD;  Location: [facility]  ENDOSCOPY;  Service: Endoscopy;  Laterality: N/A;  . Hot hemostasis 06/14/2011    Procedure: HOT HEMOSTASIS (ARGON PLASMA COAGULATION/BICAP);  Surgeon: [name], MD;  Location: [facility];  Service: Endoscopy;  Laterality: N/A;  . Av fistula placement 02/2010    LFA  . Varicose vein surgery mid 1980's    BLE  . Radiofrequency ablation kidney 2010-2012    "twice; one on each side; for cancer"  . Enucleation 2003; 2006    bilateral; "diabetes; pain"  . Cardiac catheterization ~ 2001    Family History  Problem Relation Age of Onset  . Cancer    . Coronary artery disease    . Kidney disease    . Breast cancer    . Ovarian cancer    . Lung cancer    . Diabetes    . Cancer Mother 29    breast, spine and ovarian  . Cancer Father 72    kidney, prostate  . Emphysema Father   . Cancer Sister     ovarian  . Cancer Brother     lung  . Malignant hyperthermia Neg Hx     History   Social History  . Marital Status: Married    Spouse Name: N/A    Number of Children: N/A  . Years of Education: N/A   Occupational History  . Not on file.   Social History Main Topics  . Smoking status: Never Smoker   . Smokeless tobacco: Never Used  . Alcohol Use: No  . Drug Use: No  . Sexually Active: Yes   Other Topics Concern  . Not on file   Social History Narrative  . No narrative on file    ROS: Please see the HPI.  All other systems reviewed and negative.  PHYSICAL EXAM:  BP 119/71  Pulse 62  Temp 98.2 F (36.8 C) (Oral)  Resp 15  Ht 6\' 4"  (1.93 m)  Wt 198 lb 12.8 oz (90.175 kg)  BMI 24.20 kg/m2  SpO2 100%  General: Older appearing gentleman in no distress.   Head:  Normocephalic and atraumatic. Neck: no JVD Lungs: Not examined.   Heart: Normal S1 and S2.  No murmur, rubs or gallops.  Abdomen:  Normal bowel sounds; soft; non tender; no organomegaly Neurologic: Alert and oriented x 3.  EKG:  ASSESSMENT AND PLAN:  1.  CAD  --- awaiting surgical  opinion 2.  DM  -- not on insulin in more than 4 years.    Stable. Await TCTS.

## 2011-09-28 NOTE — Telephone Encounter (Signed)
Wife & pt called this morning from the hospital about pt having bypass surgery soon.  She was concerned b/c they had not seen the surgeon yet and were a little anxious.  I explained carefully and reassuringly that the surgeon would be reviewing the cath films, pt history, and talking with Dr. Lia Foyer first. Also that they were normally in surgery early morning. Would want to make sure they had reviewed all information before talking with the family about the surgical procedure.  Reassured her and her husband.   They state he is receiving excellent care on 3700 and are very pleased with the staff. Pt and wife will await surgeon's visit later today as mentioned to them by their nurse on the unit. Horton Chin RN

## 2011-09-28 NOTE — Progress Notes (Signed)
Pt's HR started to become bradycardic around 2246. HR went from the 60s as low as 37. Pt was assessed. VS were stable and charted. Pt was asymptomatic. MD on-call was made aware. Will continue to monitor the pt. Hoover Brunette

## 2011-09-28 NOTE — Progress Notes (Signed)
VASCULAR LAB PRELIMINARY  PRELIMINARY  PRELIMINARY  PRELIMINARY  Pre-op Cardiac Surgery  Carotid Findings:  No significant ICA stenosis of plaque visualized bilaterally.   Vertebral artery flow antegrade.    Upper Extremity Right Left  Brachial Pressures Triphasic  104 A/V fistula in forearm  Radial Waveforms triphasic   Ulnar Waveforms triphasic   Palmar Arch (Allen's Test) No change with radial compression and > than 50% reduction with ulnar compression.    Findings:      Lower  Extremity Right Left  Peroneal 110  monophasic 301  biphasic  Anterior Tibial 301  biphasic 301biphasic  Posterior Tibial 301  monophasic Not found  Ankle/Brachial Indices calcified calcified    Findings:     [place] Kinyon,   RVT 09/28/2011, 7:53 PM

## 2011-09-29 ENCOUNTER — Ambulatory Visit (HOSPITAL_COMMUNITY): Payer: 59 | Admitting: Certified Registered"

## 2011-09-29 ENCOUNTER — Encounter (HOSPITAL_COMMUNITY): Payer: Self-pay | Admitting: Certified Registered"

## 2011-09-29 ENCOUNTER — Inpatient Hospital Stay (HOSPITAL_COMMUNITY): Payer: 59

## 2011-09-29 ENCOUNTER — Encounter (HOSPITAL_COMMUNITY)
Admission: RE | Disposition: A | Discharge status: Home / Self Care | Payer: Self-pay | Source: Ambulatory Visit | Attending: Surgery

## 2011-09-29 HISTORY — PX: CORONARY ARTERY BYPASS GRAFT: SHX141

## 2011-09-29 LAB — POCT I-STAT 4, (NA,K, GLUC, HGB,HCT)
Glucose, Bld: 107 mg/dL — ABNORMAL HIGH (ref 70–99)
Glucose, Bld: 97 mg/dL (ref 70–99)
Glucose, Bld: 126 mg/dL — ABNORMAL HIGH (ref 70–99)
HCT: 28 % — ABNORMAL LOW (ref 39.0–52.0)
HCT: 27 % — ABNORMAL LOW (ref 39.0–52.0)
HCT: 23 % — ABNORMAL LOW (ref 39.0–52.0)
Hemoglobin: 9.5 g/dL — ABNORMAL LOW (ref 13.0–17.0)
Hemoglobin: 7.8 g/dL — ABNORMAL LOW (ref 13.0–17.0)
Hemoglobin: 7.8 g/dL — ABNORMAL LOW (ref 13.0–17.0)
Hemoglobin: 9.2 g/dL — ABNORMAL LOW (ref 13.0–17.0)
Potassium: 3.7 mEq/L (ref 3.5–5.1)
Potassium: 4.1 mEq/L (ref 3.5–5.1)
Sodium: 135 mEq/L (ref 135–145)

## 2011-09-29 LAB — POCT I-STAT 3, ART BLOOD GAS (G3+)
Acid-base deficit: 4 mmol/L — ABNORMAL HIGH (ref 0.0–2.0)
Bicarbonate: 27.4 mEq/L — ABNORMAL HIGH (ref 20.0–24.0)
Bicarbonate: 25.3 mEq/L — ABNORMAL HIGH (ref 20.0–24.0)
O2 Saturation: 100 %
O2 Saturation: 100 %
Patient temperature: 37.4
Patient temperature: 37.5
TCO2: 29 mmol/L (ref 0–100)
pCO2 arterial: 49.4 mmHg — ABNORMAL HIGH (ref 35.0–45.0)
pCO2 arterial: 37 mmHg (ref 35.0–45.0)
pCO2 arterial: 34.5 mmHg — ABNORMAL LOW (ref 35.0–45.0)
pCO2 arterial: 47.6 mmHg — ABNORMAL HIGH (ref 35.0–45.0)
pH, Arterial: 7.401 (ref 7.350–7.450)
pH, Arterial: 7.477 — ABNORMAL HIGH (ref 7.350–7.450)
pH, Arterial: 7.472 — ABNORMAL HIGH (ref 7.350–7.450)
pH, Arterial: 7.338 — ABNORMAL LOW (ref 7.350–7.450)
pH, Arterial: 7.294 — ABNORMAL LOW (ref 7.350–7.450)
pO2, Arterial: 417 mmHg — ABNORMAL HIGH (ref 80.0–100.0)
pO2, Arterial: 324 mmHg — ABNORMAL HIGH (ref 80.0–100.0)
pO2, Arterial: 193 mmHg — ABNORMAL HIGH (ref 80.0–100.0)

## 2011-09-29 LAB — POCT I-STAT, CHEM 8
BUN: 57 mg/dL — ABNORMAL HIGH (ref 6–23)
Calcium, Ion: 1.17 mmol/L (ref 1.12–1.32)
Chloride: 102 mEq/L (ref 96–112)
Creatinine, Ser: 4.3 mg/dL — ABNORMAL HIGH (ref 0.50–1.35)
Glucose, Bld: 128 mg/dL — ABNORMAL HIGH (ref 70–99)
TCO2: 22 mmol/L (ref 0–100)

## 2011-09-29 LAB — GLUCOSE, CAPILLARY
Glucose-Capillary: 101 mg/dL — ABNORMAL HIGH (ref 70–99)
Glucose-Capillary: 108 mg/dL — ABNORMAL HIGH (ref 70–99)

## 2011-09-29 LAB — CBC
HCT: 27.5 % — ABNORMAL LOW (ref 39.0–52.0)
Hemoglobin: 10.7 g/dL — ABNORMAL LOW (ref 13.0–17.0)
MCH: 33.9 pg (ref 26.0–34.0)
MCH: 33.8 pg (ref 26.0–34.0)
MCH: 34 pg (ref 26.0–34.0)
MCHC: 35.2 g/dL (ref 30.0–36.0)
MCV: 97.2 fL (ref 78.0–100.0)
MCV: 96.5 fL (ref 78.0–100.0)
Platelets: 101 10*3/uL — ABNORMAL LOW (ref 150–400)
Platelets: 119 10*3/uL — ABNORMAL LOW (ref 150–400)
Platelets: 123 10*3/uL — ABNORMAL LOW (ref 150–400)
RBC: 3.16 MIL/uL — ABNORMAL LOW (ref 4.22–5.81)
RBC: 2.93 MIL/uL — ABNORMAL LOW (ref 4.22–5.81)
RDW: 13.6 % (ref 11.5–15.5)
RDW: 13.9 % (ref 11.5–15.5)
WBC: 4.4 10*3/uL (ref 4.0–10.5)

## 2011-09-29 LAB — BASIC METABOLIC PANEL
CO2: 29 mEq/L (ref 19–32)
Calcium: 9.4 mg/dL (ref 8.4–10.5)
Chloride: 95 mEq/L — ABNORMAL LOW (ref 96–112)
Creatinine, Ser: 4.61 mg/dL — ABNORMAL HIGH (ref 0.50–1.35)
Glucose, Bld: 109 mg/dL — ABNORMAL HIGH (ref 70–99)
Sodium: 138 mEq/L (ref 135–145)

## 2011-09-29 LAB — PROTIME-INR: Prothrombin Time: 16.1 seconds — ABNORMAL HIGH (ref 11.6–15.2)

## 2011-09-29 LAB — POCT I-STAT GLUCOSE
Glucose, Bld: 97 mg/dL (ref 70–99)
Operator id: 284731

## 2011-09-29 SURGERY — CORONARY ARTERY BYPASS GRAFTING (CABG)
Anesthesia: General | Site: Chest | Wound class: Clean

## 2011-09-29 MED ORDER — METOPROLOL TARTRATE 1 MG/ML IV SOLN: 2.5000 mg | INTRAVENOUS | Status: DC | PRN

## 2011-09-29 MED ORDER — INSULIN ASPART 100 UNIT/ML ~~LOC~~ SOLN
0.0000 [IU] | SUBCUTANEOUS | Status: DC
  Administered 2011-09-30 (×2): 2 [IU] via SUBCUTANEOUS
  Administered 2011-09-30: 4 [IU] via SUBCUTANEOUS

## 2011-09-29 MED ORDER — PHENYLEPHRINE HCL 10 MG/ML IJ SOLN
0.0000 ug/min | INTRAVENOUS | Status: DC
  Filled 2011-09-29 (×3): qty 2

## 2011-09-29 MED ORDER — VANCOMYCIN HCL IN DEXTROSE 1-5 GM/200ML-% IV SOLN
1000.0000 mg | Freq: Once | INTRAVENOUS | Status: AC
  Administered 2011-09-29: 1000 mg via INTRAVENOUS
  Filled 2011-09-29: qty 200

## 2011-09-29 MED ORDER — SODIUM CHLORIDE 0.9 % IV SOLN
INTRAVENOUS | Status: DC | PRN
  Administered 2011-09-29: 07:00:00 via INTRAVENOUS

## 2011-09-29 MED ORDER — DOCUSATE SODIUM 100 MG PO CAPS
200.0000 mg | ORAL_CAPSULE | Freq: Every day | ORAL | Status: DC
  Administered 2011-09-30 – 2011-10-04 (×5): 200 mg via ORAL
  Filled 2011-09-29 (×6): qty 2

## 2011-09-29 MED ORDER — DEXTROSE 5 % IV SOLN
1.5000 g | Freq: Two times a day (BID) | INTRAVENOUS | Status: AC
  Administered 2011-09-29 – 2011-10-01 (×4): 1.5 g via INTRAVENOUS
  Filled 2011-09-29 (×4): qty 1.5

## 2011-09-29 MED ORDER — ACETAMINOPHEN 650 MG RE SUPP
650.0000 mg | RECTAL | Status: AC
  Administered 2011-09-29: 650 mg via RECTAL

## 2011-09-29 MED ORDER — ASPIRIN EC 325 MG PO TBEC
325.0000 mg | DELAYED_RELEASE_TABLET | Freq: Every day | ORAL | Status: DC
  Administered 2011-09-30: 325 mg via ORAL
  Filled 2011-09-29 (×2): qty 1

## 2011-09-29 MED ORDER — LACTATED RINGERS IV SOLN: INTRAVENOUS | Status: DC

## 2011-09-29 MED ORDER — BISACODYL 10 MG RE SUPP: 10.0000 mg | Freq: Every day | RECTAL | Status: DC

## 2011-09-29 MED ORDER — ROCURONIUM BROMIDE 100 MG/10ML IV SOLN
INTRAVENOUS | Status: DC | PRN
  Administered 2011-09-29: 50 mg via INTRAVENOUS

## 2011-09-29 MED ORDER — THROMBIN 20000 UNITS EX KIT
PACK | OROMUCOSAL | Status: DC | PRN
  Administered 2011-09-29: 10:00:00 via TOPICAL

## 2011-09-29 MED ORDER — MORPHINE SULFATE 2 MG/ML IJ SOLN
2.0000 mg | INTRAMUSCULAR | Status: DC | PRN
  Administered 2011-09-29 – 2011-09-30 (×4): 2 mg via INTRAVENOUS
  Filled 2011-09-29 (×5): qty 1

## 2011-09-29 MED ORDER — FAMOTIDINE IN NACL 20-0.9 MG/50ML-% IV SOLN
20.0000 mg | Freq: Once | INTRAVENOUS | Status: AC
  Administered 2011-09-29: 20 mg via INTRAVENOUS

## 2011-09-29 MED ORDER — INSULIN REGULAR BOLUS VIA INFUSION
0.0000 [IU] | Freq: Three times a day (TID) | INTRAVENOUS | Status: DC
  Filled 2011-09-29: qty 10

## 2011-09-29 MED ORDER — LACTATED RINGERS IV SOLN: 500.0000 mL | Freq: Once | INTRAVENOUS | Status: AC | PRN

## 2011-09-29 MED ORDER — PROTAMINE SULFATE 10 MG/ML IV SOLN
INTRAVENOUS | Status: DC | PRN
  Administered 2011-09-29 (×5): 50 mg via INTRAVENOUS

## 2011-09-29 MED ORDER — HEPARIN SODIUM (PORCINE) 1000 UNIT/ML IJ SOLN
INTRAMUSCULAR | Status: DC | PRN
  Administered 2011-09-29: 34000 [IU] via INTRAVENOUS

## 2011-09-29 MED ORDER — MAGNESIUM SULFATE 40 MG/ML IJ SOLN: 4.0000 g | Freq: Once | INTRAMUSCULAR | Status: DC

## 2011-09-29 MED ORDER — ACETAMINOPHEN 500 MG PO TABS
1000.0000 mg | ORAL_TABLET | Freq: Four times a day (QID) | ORAL | Status: AC
  Administered 2011-09-30 – 2011-10-04 (×14): 1000 mg via ORAL
  Filled 2011-09-29 (×18): qty 2

## 2011-09-29 MED ORDER — VECURONIUM BROMIDE 10 MG IV SOLR
INTRAVENOUS | Status: DC | PRN
  Administered 2011-09-29: 4 mg via INTRAVENOUS
  Administered 2011-09-29: 5 mg via INTRAVENOUS
  Administered 2011-09-29: 3 mg via INTRAVENOUS
  Administered 2011-09-29: 8 mg via INTRAVENOUS

## 2011-09-29 MED ORDER — SODIUM CHLORIDE 0.9 % IV SOLN
INTRAVENOUS | Status: DC
  Filled 2011-09-29: qty 1

## 2011-09-29 MED ORDER — INSULIN ASPART 100 UNIT/ML ~~LOC~~ SOLN: 0.0000 [IU] | SUBCUTANEOUS | Status: AC

## 2011-09-29 MED ORDER — SODIUM CHLORIDE 0.9 % IJ SOLN
3.0000 mL | Freq: Two times a day (BID) | INTRAMUSCULAR | Status: DC
  Administered 2011-10-01: 3 mL via INTRAVENOUS

## 2011-09-29 MED ORDER — SODIUM CHLORIDE 0.45 % IV SOLN
INTRAVENOUS | Status: DC
  Administered 2011-09-29: 13:00:00 via INTRAVENOUS

## 2011-09-29 MED ORDER — PANTOPRAZOLE SODIUM 40 MG PO TBEC
40.0000 mg | DELAYED_RELEASE_TABLET | Freq: Every day | ORAL | Status: DC
  Administered 2011-10-01 – 2011-10-05 (×5): 40 mg via ORAL
  Filled 2011-09-29 (×5): qty 1

## 2011-09-29 MED ORDER — METOPROLOL TARTRATE 25 MG/10 ML ORAL SUSPENSION
12.5000 mg | Freq: Two times a day (BID) | ORAL | Status: DC
  Administered 2011-10-01: 12.5 mg
  Filled 2011-09-29 (×13): qty 5

## 2011-09-29 MED ORDER — MIDAZOLAM HCL 5 MG/5ML IJ SOLN
INTRAMUSCULAR | Status: DC | PRN
  Administered 2011-09-29: 1 mg via INTRAVENOUS
  Administered 2011-09-29 (×2): 2 mg via INTRAVENOUS
  Administered 2011-09-29: 3 mg via INTRAVENOUS
  Administered 2011-09-29 (×3): 2 mg via INTRAVENOUS

## 2011-09-29 MED ORDER — 0.9 % SODIUM CHLORIDE (POUR BTL) OPTIME
TOPICAL | Status: DC | PRN
  Administered 2011-09-29: 6000 mL

## 2011-09-29 MED ORDER — NITROGLYCERIN IN D5W 200-5 MCG/ML-% IV SOLN: 0.0000 ug/min | INTRAVENOUS | Status: DC

## 2011-09-29 MED ORDER — THROMBIN 20000 UNITS EX SOLR
CUTANEOUS | Status: AC
  Filled 2011-09-29: qty 20000

## 2011-09-29 MED ORDER — ACETAMINOPHEN 160 MG/5ML PO SOLN
975.0000 mg | Freq: Four times a day (QID) | ORAL | Status: AC
  Filled 2011-09-29: qty 40.6

## 2011-09-29 MED ORDER — ALBUMIN HUMAN 5 % IV SOLN
250.0000 mL | INTRAVENOUS | Status: AC | PRN
  Administered 2011-09-29 (×2): 250 mL via INTRAVENOUS

## 2011-09-29 MED ORDER — ASPIRIN 81 MG PO CHEW: 324.0000 mg | CHEWABLE_TABLET | Freq: Every day | ORAL | Status: DC

## 2011-09-29 MED ORDER — SODIUM CHLORIDE 0.9 % IV SOLN: 250.0000 mL | INTRAVENOUS | Status: DC

## 2011-09-29 MED ORDER — SODIUM CHLORIDE 0.9 % IJ SOLN: 3.0000 mL | INTRAMUSCULAR | Status: DC | PRN

## 2011-09-29 MED ORDER — PROPOFOL 10 MG/ML IV EMUL
INTRAVENOUS | Status: DC | PRN
  Administered 2011-09-29: 30 mg via INTRAVENOUS

## 2011-09-29 MED ORDER — DARBEPOETIN ALFA-POLYSORBATE 25 MCG/0.42ML IJ SOLN: 25.0000 ug | INTRAMUSCULAR | Status: DC

## 2011-09-29 MED ORDER — MIDAZOLAM HCL 2 MG/2ML IJ SOLN: 2.0000 mg | INTRAMUSCULAR | Status: DC | PRN

## 2011-09-29 MED ORDER — ACETAMINOPHEN 160 MG/5ML PO SOLN: 650.0000 mg | ORAL | Status: AC

## 2011-09-29 MED ORDER — HEMOSTATIC AGENTS (NO CHARGE) OPTIME
TOPICAL | Status: DC | PRN
  Administered 2011-09-29: 1 via TOPICAL

## 2011-09-29 MED ORDER — TRAMADOL HCL 50 MG PO TABS
50.0000 mg | ORAL_TABLET | Freq: Two times a day (BID) | ORAL | Status: DC | PRN
  Administered 2011-09-30 – 2011-10-01 (×2): 100 mg via ORAL
  Filled 2011-09-29 (×2): qty 2

## 2011-09-29 MED ORDER — ONDANSETRON HCL 4 MG/2ML IJ SOLN: 4.0000 mg | Freq: Four times a day (QID) | INTRAMUSCULAR | Status: DC | PRN

## 2011-09-29 MED ORDER — VANCOMYCIN HCL IN DEXTROSE 1-5 GM/200ML-% IV SOLN: 1000.0000 mg | Freq: Once | INTRAVENOUS | Status: DC

## 2011-09-29 MED ORDER — SODIUM CHLORIDE 0.9 % IV SOLN
0.1000 ug/kg/h | INTRAVENOUS | Status: DC
  Filled 2011-09-29 (×2): qty 2

## 2011-09-29 MED ORDER — FENTANYL CITRATE 0.05 MG/ML IJ SOLN
INTRAMUSCULAR | Status: DC | PRN
  Administered 2011-09-29 (×2): 250 ug via INTRAVENOUS
  Administered 2011-09-29: 150 ug via INTRAVENOUS
  Administered 2011-09-29: 100 ug via INTRAVENOUS
  Administered 2011-09-29: 250 ug via INTRAVENOUS
  Administered 2011-09-29: 50 ug via INTRAVENOUS
  Administered 2011-09-29: 100 ug via INTRAVENOUS
  Administered 2011-09-29: 600 ug via INTRAVENOUS

## 2011-09-29 MED ORDER — POTASSIUM CHLORIDE 10 MEQ/50ML IV SOLN: 10.0000 meq | INTRAVENOUS | Status: DC

## 2011-09-29 MED ORDER — SODIUM BICARBONATE 8.4 % IV SOLN
50.0000 meq | Freq: Once | INTRAVENOUS | Status: AC
  Administered 2011-09-29: 50 meq via INTRAVENOUS

## 2011-09-29 MED ORDER — MORPHINE SULFATE 2 MG/ML IJ SOLN
1.0000 mg | INTRAMUSCULAR | Status: AC | PRN
Start: 2011-09-29 — End: 2011-09-30

## 2011-09-29 MED ORDER — BISACODYL 5 MG PO TBEC
10.0000 mg | DELAYED_RELEASE_TABLET | Freq: Every day | ORAL | Status: DC
  Administered 2011-09-30 – 2011-10-05 (×5): 10 mg via ORAL
  Filled 2011-09-29 (×5): qty 2

## 2011-09-29 MED ORDER — SODIUM CHLORIDE 0.9 % IV SOLN
INTRAVENOUS | Status: DC
  Administered 2011-09-29: 13:00:00 via INTRAVENOUS

## 2011-09-29 MED ORDER — METOPROLOL TARTRATE 12.5 MG HALF TABLET
12.5000 mg | ORAL_TABLET | Freq: Two times a day (BID) | ORAL | Status: DC
  Administered 2011-10-02 – 2011-10-05 (×4): 12.5 mg via ORAL
  Filled 2011-09-29 (×14): qty 1

## 2011-09-29 MED FILL — Potassium Chloride Inj 2 mEq/ML: INTRAVENOUS | Qty: 40 | Status: AC

## 2011-09-29 MED FILL — Magnesium Sulfate Inj 50%: INTRAMUSCULAR | Qty: 10 | Status: AC

## 2011-09-29 SURGICAL SUPPLY — 98 items
ATTRACTOMAT 16X20 MAGNETIC DRP (DRAPES) ×2 IMPLANT
BAG DECANTER FOR FLEXI CONT (MISCELLANEOUS) ×2 IMPLANT
BANDAGE ELASTIC 4 VELCRO ST LF (GAUZE/BANDAGES/DRESSINGS) ×2 IMPLANT
BANDAGE ELASTIC 6 VELCRO ST LF (GAUZE/BANDAGES/DRESSINGS) ×2 IMPLANT
BANDAGE GAUZE ELAST BULKY 4 IN (GAUZE/BANDAGES/DRESSINGS) ×2 IMPLANT
BASKET HEART (ORDER IN 25'S) (MISCELLANEOUS) ×1
BASKET HEART (ORDER IN 25S) (MISCELLANEOUS) ×1 IMPLANT
BLADE STERNUM SYSTEM 6 (BLADE) ×2 IMPLANT
BLADE SURG 11 STRL SS (BLADE) ×1 IMPLANT
CANISTER SUCTION 2500CC (MISCELLANEOUS) ×2 IMPLANT
CATH ROBINSON RED A/P 18FR (CATHETERS) ×4 IMPLANT
CATH THORACIC 28FR (CATHETERS) ×2 IMPLANT
CATH THORACIC 28FR RT ANG (CATHETERS) IMPLANT
CATH THORACIC 36FR (CATHETERS) ×2 IMPLANT
CATH THORACIC 36FR RT ANG (CATHETERS) ×2 IMPLANT
CLIP TI MEDIUM 24 (CLIP) ×1 IMPLANT
CLIP TI WIDE RED SMALL 24 (CLIP) ×1 IMPLANT
CLOTH BEACON ORANGE TIMEOUT ST (SAFETY) ×2 IMPLANT
COVER SURGICAL LIGHT HANDLE (MISCELLANEOUS) ×4 IMPLANT
CRADLE DONUT ADULT HEAD (MISCELLANEOUS) ×2 IMPLANT
DRAPE CARDIOVASCULAR INCISE (DRAPES) ×2
DRAPE SLUSH MACHINE 52X66 (DRAPES) ×1 IMPLANT
DRAPE SLUSH/WARMER DISC (DRAPES) IMPLANT
DRAPE SRG 135X102X78XABS (DRAPES) ×1 IMPLANT
DRSG COVADERM 4X14 (GAUZE/BANDAGES/DRESSINGS) ×2 IMPLANT
ELECT CAUTERY BLADE 6.4 (BLADE) ×2 IMPLANT
ELECT REM PT RETURN 9FT ADLT (ELECTROSURGICAL) ×4
ELECTRODE REM PT RTRN 9FT ADLT (ELECTROSURGICAL) ×2 IMPLANT
GLOVE BIO SURGEON STRL SZ 6 (GLOVE) IMPLANT
GLOVE BIO SURGEON STRL SZ 6.5 (GLOVE) ×6 IMPLANT
GLOVE BIO SURGEON STRL SZ7 (GLOVE) IMPLANT
GLOVE BIO SURGEON STRL SZ7.5 (GLOVE) IMPLANT
GLOVE BIOGEL PI IND STRL 6 (GLOVE) IMPLANT
GLOVE BIOGEL PI IND STRL 6.5 (GLOVE) IMPLANT
GLOVE BIOGEL PI IND STRL 7.0 (GLOVE) IMPLANT
GLOVE BIOGEL PI INDICATOR 6 (GLOVE) ×2
GLOVE BIOGEL PI INDICATOR 6.5 (GLOVE) ×2
GLOVE BIOGEL PI INDICATOR 7.0 (GLOVE)
GLOVE EUDERMIC 7 POWDERFREE (GLOVE) ×4 IMPLANT
GLOVE ORTHO TXT STRL SZ7.5 (GLOVE) IMPLANT
GOWN PREVENTION PLUS XLARGE (GOWN DISPOSABLE) ×2 IMPLANT
GOWN STRL NON-REIN LRG LVL3 (GOWN DISPOSABLE) ×10 IMPLANT
HEMOSTAT POWDER SURGIFOAM 1G (HEMOSTASIS) ×6 IMPLANT
HEMOSTAT SURGICEL 2X14 (HEMOSTASIS) ×2 IMPLANT
INSERT FOGARTY 61MM (MISCELLANEOUS) IMPLANT
INSERT FOGARTY XLG (MISCELLANEOUS) ×1 IMPLANT
KIT BASIN OR (CUSTOM PROCEDURE TRAY) ×2 IMPLANT
KIT CATH CPB BARTLE (MISCELLANEOUS) ×2 IMPLANT
KIT ROOM TURNOVER OR (KITS) ×2 IMPLANT
KIT SUCTION CATH 14FR (SUCTIONS) ×2 IMPLANT
KIT VASOVIEW W/TROCAR VH 2000 (KITS) ×2 IMPLANT
NS IRRIG 1000ML POUR BTL (IV SOLUTION) ×11 IMPLANT
PACK OPEN HEART (CUSTOM PROCEDURE TRAY) ×2 IMPLANT
PAD ARMBOARD 7.5X6 YLW CONV (MISCELLANEOUS) ×4 IMPLANT
PENCIL BUTTON HOLSTER BLD 10FT (ELECTRODE) ×2 IMPLANT
PUNCH AORTIC ROTATE 4.0MM (MISCELLANEOUS) IMPLANT
PUNCH AORTIC ROTATE 4.5MM 8IN (MISCELLANEOUS) ×2 IMPLANT
PUNCH AORTIC ROTATE 5MM 8IN (MISCELLANEOUS) IMPLANT
SET CARDIOPLEGIA MPS 5001102 (MISCELLANEOUS) ×1 IMPLANT
SPONGE GAUZE 4X4 12PLY (GAUZE/BANDAGES/DRESSINGS) ×5 IMPLANT
SPONGE INTESTINAL PEANUT (DISPOSABLE) IMPLANT
SPONGE LAP 18X18 X RAY DECT (DISPOSABLE) ×1 IMPLANT
SPONGE LAP 4X18 X RAY DECT (DISPOSABLE) ×2 IMPLANT
SUT BONE WAX W31G (SUTURE) ×2 IMPLANT
SUT MNCRL AB 4-0 PS2 18 (SUTURE) ×1 IMPLANT
SUT PROLENE 3 0 SH DA (SUTURE) IMPLANT
SUT PROLENE 3 0 SH1 36 (SUTURE) ×2 IMPLANT
SUT PROLENE 4 0 RB 1 (SUTURE)
SUT PROLENE 4 0 SH DA (SUTURE) IMPLANT
SUT PROLENE 4-0 RB1 .5 CRCL 36 (SUTURE) IMPLANT
SUT PROLENE 5 0 C 1 36 (SUTURE) IMPLANT
SUT PROLENE 6 0 C 1 30 (SUTURE) IMPLANT
SUT PROLENE 7 0 BV 1 (SUTURE) IMPLANT
SUT PROLENE 7 0 BV1 MDA (SUTURE) ×2 IMPLANT
SUT PROLENE 8 0 BV175 6 (SUTURE) IMPLANT
SUT SILK  1 MH (SUTURE)
SUT SILK 1 MH (SUTURE) IMPLANT
SUT STEEL STERNAL CCS#1 18IN (SUTURE) IMPLANT
SUT STEEL SZ 6 DBL 3X14 BALL (SUTURE) ×3 IMPLANT
SUT VIC AB 1 CTX 36 (SUTURE) ×4
SUT VIC AB 1 CTX36XBRD ANBCTR (SUTURE) ×2 IMPLANT
SUT VIC AB 2-0 CT1 27 (SUTURE) ×2
SUT VIC AB 2-0 CT1 TAPERPNT 27 (SUTURE) IMPLANT
SUT VIC AB 2-0 CTX 27 (SUTURE) IMPLANT
SUT VIC AB 3-0 SH 27 (SUTURE)
SUT VIC AB 3-0 SH 27X BRD (SUTURE) IMPLANT
SUT VIC AB 3-0 X1 27 (SUTURE) IMPLANT
SUT VICRYL 4-0 PS2 18IN ABS (SUTURE) IMPLANT
SUTURE E-PAK OPEN HEART (SUTURE) ×2 IMPLANT
SYSTEM SAHARA CHEST DRAIN ATS (WOUND CARE) ×2 IMPLANT
TAPE CLOTH SURG 4X10 WHT LF (GAUZE/BANDAGES/DRESSINGS) ×2 IMPLANT
TOWEL OR 17X24 6PK STRL BLUE (TOWEL DISPOSABLE) ×2 IMPLANT
TOWEL OR 17X26 10 PK STRL BLUE (TOWEL DISPOSABLE) ×2 IMPLANT
TRAY FOLEY IC TEMP SENS 14FR (CATHETERS) ×2 IMPLANT
TUBE SUCT INTRACARD DLP 20F (MISCELLANEOUS) ×2 IMPLANT
TUBING INSUFFLATION 10FT LAP (TUBING) ×2 IMPLANT
UNDERPAD 30X30 INCONTINENT (UNDERPADS AND DIAPERS) ×2 IMPLANT
WATER STERILE IRR 1000ML POUR (IV SOLUTION) ×4 IMPLANT

## 2011-09-29 NOTE — Preoperative (Signed)
Beta Blockers   Reason not to administer Beta Blockers:Not Applicable, last dose [id number] @ 05:51

## 2011-09-29 NOTE — Anesthesia Preprocedure Evaluation (Addendum)
Anesthesia Evaluation  Patient identified by MRN, date of birth, ID band Patient awake    Reviewed: Allergy & Precautions, H&P , NPO status , Patient's Chart, lab work & pertinent test results  Airway Mallampati: I TM Distance: >3 FB Neck ROM: Full    Dental  (+) Teeth Intact and Dental Advisory Given   Pulmonary  breath sounds clear to auscultation        Cardiovascular hypertension, Pt. on medications + CAD Rhythm:Regular Rate:Normal     Neuro/Psych    GI/Hepatic   Endo/Other  Diabetes mellitus-, Type 2, Insulin Dependent  Renal/GU      Musculoskeletal   Abdominal   Peds  Hematology   Anesthesia Other Findings   Reproductive/Obstetrics                           Anesthesia Physical Anesthesia Plan  ASA: IV  Anesthesia Plan: General   Post-op Pain Management:    Induction: Intravenous  Airway Management Planned: Oral ETT  Additional Equipment: Arterial line, PA Cath and Ultrasound Guidance Line Placement  Intra-op Plan:   Post-operative Plan: Post-operative intubation/ventilation  Informed Consent: I have reviewed the patients History and Physical, chart, labs and discussed the procedure including the risks, benefits and alternatives for the proposed anesthesia with the patient or authorized representative who has indicated his/her understanding and acceptance.   Dental advisory given  Plan Discussed with: CRNA, Anesthesiologist and Surgeon  Anesthesia Plan Comments:        Anesthesia Quick Evaluation

## 2011-09-29 NOTE — Progress Notes (Signed)
Visited with family in waiting room and offered support.  Family requested we end the visit with prayer.  I will continue to follow.  Please page me if I am needed or requested. [name]  [phone] personal pager

## 2011-09-29 NOTE — Progress Notes (Signed)
I have seen and examined this patient and agree with the plan of care  .  Avalina Benko W 09/29/2011, 3:39 PM

## 2011-09-29 NOTE — Progress Notes (Signed)
[facility] Progress Note  Subjective:  Sedated  Objective Filed Vitals:   09/28/11 2100 09/28/11 2257 09/29/11 0500 09/29/11 1248  BP: 112/67 112/73 95/66   Pulse: 59 64 51   Temp: 97.4 F (36.3 C) 98.3 F (36.8 C) 98.3 F (36.8 C)   TempSrc: Oral Oral Oral   Resp: 15 18 18    Height:    6\' 4"  (1.93 m)  Weight:      SpO2: 100% 98% 97%    Physical Exam Gen: intubated CVS:RRR  Resp: no wheezes or rales appreciated [id number] Lower Extremities:  No LE edema bilaterally; right leg wrapped Dialysis Access: left lower AVF + bruit,   Dialysis Orders: Center: [facility] On MWF.  EDW 87.5 HD Bath 2K 2.25 Ca Time 4 Heparin 8000 Access left lower AVF BFR 400 DFR 800  Zemplar 1 mcg IV/HD Epogen 1800 was d/c 6/25 Units IV/HD Infed 50/Wed  Other profile 2   Assessment/Plan: 1. S/p CABD x 2; LIMA to LAD and SVG to OM and EVH from right thigh - stable 2. ESRD  - continue MWF HD; no indication for HD at this time; will write dialysis orders in the am, when assessed; K up to 4.6; received  KCl 10 x 3  for earlier K of 3.7; This was ordered at 1245; will d/c because K is now 4.6 follow - if he develops hyperkalemia today can dialyze; chemistries reordered for 1700; no heparin 3. ABLA Anemia:stable - Hgb up to 9.9; off Epo previously; dose Aranesp 25 next HD  4. DM- per primary 5. Nutrition: NPO 6. Hypertension:BP ok; UF 1.2 yesterday to 88 kg. 7. MBD - resume binders when eating; continue zemplar 1 q HD Additional Objective Labs: Basic Metabolic Panel:  Lab [id number] 09/29/11 1027 09/29/11 1006 09/29/11 0500 09/28/11 0818 09/28/11 0515  NA 135 135 135 -- -- --  K 4.6 4.1 3.7 -- -- --  CL -- -- -- 95* 95* 97  CO2 -- -- -- 29 27 28   GLUCOSE 126* 87 97 -- -- --  BUN -- -- -- 56* 101* 98*  CREATININE -- -- -- 4.61* 6.21* 6.02*  CALCIUM -- -- -- 9.4 9.6 9.7  ALB -- -- -- -- -- --  PHOS -- -- -- -- 6.7* 6.6*   Liver Function Tests:  Lab 09/28/11 0818 09/28/11 0515  AST  -- 16  ALT -- 35  ALKPHOS -- 83  BILITOT -- 0.4  PROT -- 6.2  ALBUMIN 3.5 3.5  CBC:  Lab 09/29/11 1310 09/29/11 1136 09/29/11 1040 09/29/11 0500 09/28/11 0515  WBC 8.2 -- -- 4.4 4.7  NEUTROABS -- -- -- -- --  HGB 9.9* 7.8* 8.5* -- --  HCT 28.1* 23.0* 23.6* -- --  MCV 95.9 -- -- 97.2 98.5  PLT 119* -- 103* 101* --  CBG:  Lab 09/28/11 2026 09/28/11 1255 09/27/11 2143 09/27/11 1635 09/27/11 1032  GLUCAP 143* 87 183* 98 132*  Medications:   . acetaminophen (TYLENOL) oral liquid 160 mg/5 mL  650 mg Per Tube NOW   Or  . acetaminophen  650 mg Rectal NOW  . acetaminophen  1,000 mg Oral Q6H   Or  . acetaminophen (TYLENOL) oral liquid 160 mg/5 mL  975 mg Per Tube Q6H  . aspirin EC  325 mg Oral Daily   Or  . aspirin  324 mg Per Tube Daily  . atorvastatin  10 mg Oral QHS  . bisacodyl  10 mg Oral Daily  Or  . bisacodyl  10 mg Rectal Daily  . calcium acetate  1,334 mg Oral TID WC  . cefUROXime (ZINACEF)  IV  1.5 g Intravenous To OR  . cefUROXime (ZINACEF)  IV  1.5 g Intravenous Q12H  . chlorhexidine  60 mL Topical Once  . dexmedetomidine (PRECEDEX) IV infusion for high rates  0.1-0.7 mcg/kg/hr Intravenous To OR  . docusate sodium  200 mg Oral Daily  . famotidine (PEPCID) IV  20 mg Intravenous Once  . finasteride  5 mg Oral QHS  . hydrOXYzine      . insulin (NOVOLIN-R) infusion   Intravenous To OR  . insulin regular  0-10 Units Intravenous TID WC  . iron dextran complex  50 mg Intravenous Q Wed-HD  . magnesium sulfate  4 g Intravenous Once  . metoprolol tartrate  12.5 mg Oral BID   Or  . metoprolol tartrate  12.5 mg Per Tube BID  . metoprolol tartrate  12.5 mg Oral Once  . multivitamin  1 tablet Oral QHS  . nitroGLYCERIN  2-200 mcg/min Intravenous To OR  . nitroglycerin-nicardipine-HEPARIN-sodium bicarbonate irrigation for artery spasm   Irrigation To OR  . pantoprazole  40 mg Oral Q1200  . paricalcitol  1 mcg Intravenous 3 times weekly  . phenylephrine (NEO-SYNEPHRINE)  Adult infusion  30-200 mcg/min Intravenous To OR  . potassium chloride  10 mEq Intravenous Q1 Hr x 3  . sodium chloride  3 mL Intravenous Q12H  . tranexamic acid  15 mg/kg Intravenous To OR  . tranexamic acid (CYKLOKAPRON) infusion ([facility])  1.5 mg/kg/hr Intravenous To OR  . vancomycin  1,500 mg Intravenous To OR  . vancomycin  1,000 mg Intravenous Once    I  have reviewed scheduled and prn medications.  Myriam Jacobson, PA-C [place]  09/29/2011,1:27 PM  LOS: 2 days

## 2011-09-29 NOTE — Anesthesia Postprocedure Evaluation (Signed)
  Anesthesia Post-op Note  Patient: [name]  Procedure(s) Performed: Procedure(s) (LRB): CORONARY ARTERY BYPASS GRAFTING (CABG) (N/A)  Patient Location: SICU  Anesthesia Type: General  Level of Consciousness: sedated and unresponsive  Airway and Oxygen Therapy: Patient remains intubated per anesthesia plan  Post-op Pain: none  Post-op Assessment: Post-op Vital signs reviewed, Patient's Cardiovascular Status Stable and Respiratory Function Stable  Post-op Vital Signs: Reviewed and stable  Complications: No apparent anesthesia complications

## 2011-09-29 NOTE — Brief Op Note (Signed)
09/27/2011 - 09/29/2011  11:00 AM  PATIENT:  [name]  61 y.o. male  PRE-OPERATIVE DIAGNOSIS:  CAD  POST-OPERATIVE DIAGNOSIS:  CAD  PROCEDURE: CORONARY ARTERY BYPASS GRAFTING (CABG) x 2 (LIMA to LAD and SVG to OM) with EVH from right thigh  SURGEON:  Surgeon(s) and Role:    * [name], MD - Primary  PHYSICIAN ASSISTANT: Lars Pinks PA-C   ANESTHESIA:   general  EBL:  Total I/O In: -  Out: 360 [Urine:360]   DRAINS:  Chest Tube(s) in the Mediastinal and Pleural spaces    COUNTS CORRECT:  YES  DICTATION: .Dragon Dictation  PLAN OF CARE: Admit to inpatient   PATIENT DISPOSITION:  ICU - intubated and hemodynamically stable.   Delay start of Pharmacological VTE agent (>24hrs) due to surgical blood loss or risk of bleeding: yes  PRE OP WEIGHT: 88 kg

## 2011-09-29 NOTE — Transfer of Care (Signed)
Immediate Anesthesia Transfer of Care Note  Patient: [name]  Procedure(s) Performed: Procedure(s) (LRB): CORONARY ARTERY BYPASS GRAFTING (CABG) (N/A)  Patient Location: SICU  Anesthesia Type: General  Level of Consciousness: Patient remains intubated per anesthesia plan  Airway & Oxygen Therapy: Patient remains intubated per anesthesia plan and Patient placed on Ventilator (see vital sign flow sheet for setting)  Post-op Assessment: Report given to PACU RN  Post vital signs: Reviewed and unstable  Complications: No apparent anesthesia complications

## 2011-09-29 NOTE — Progress Notes (Signed)
Patient ID: [name], male   DOB: 1951-02-18, 61 y.o.   MRN: [id number]  Filed Vitals:   09/29/11 1830 09/29/11 1845 09/29/11 1900 09/29/11 1915  BP:   90/62   Pulse: 79 79 80 80  Temp: 99.5 F (37.5 C)  99.3 F (37.4 C) 99.3 F (37.4 C)  TempSrc:      Resp: 11 16 15 11   Height:      Weight:      SpO2: 100% 100% 100% 100%   CI=1.8  On neo 45 meq Extubated with sats 100%  CBC    Component Value Date/Time   WBC 13.1* 09/29/2011 1830   RBC 2.85* 09/29/2011 1830   HGB 8.8* 09/29/2011 1834   HCT 26.0* 09/29/2011 1834   PLT 123* 09/29/2011 1830   MCV 96.5 09/29/2011 1830   MCH 34.0 09/29/2011 1830   MCHC 35.3 09/29/2011 1830   RDW 13.9 09/29/2011 1830   LYMPHSABS 0.6* 09/21/2011 1620   MONOABS 0.7 09/21/2011 1620   EOSABS 0.1 09/21/2011 1620   BASOSABS 0.0 09/21/2011 1620    BMET    Component Value Date/Time   NA 134* 09/29/2011 1834   K 4.7 09/29/2011 1834   CL 102 09/29/2011 1834   CO2 29 09/29/2011 0500   GLUCOSE 128* 09/29/2011 1834   BUN 57* 09/29/2011 1834   CREATININE 4.30* 09/29/2011 1834   CALCIUM 9.4 09/29/2011 0500   GFRNONAA 13* 09/29/2011 1830   GFRAA 16* 09/29/2011 1830    A/P:  Stable s/p CABG

## 2011-09-29 NOTE — OR Nursing (Signed)
Chest incision at 0915

## 2011-09-29 NOTE — Anesthesia Procedure Notes (Signed)
Procedure Name: Intubation Date/Time: 09/29/2011 8:21 AM Performed by: Sampson Si E Pre-anesthesia Checklist: Patient identified, Timeout performed, Emergency Drugs available, Suction available and Patient being monitored Patient Re-evaluated:Patient Re-evaluated prior to inductionOxygen Delivery Method: Circle system utilized Preoxygenation: Pre-oxygenation with 100% oxygen Intubation Type: IV induction Ventilation: Mask ventilation without difficulty Laryngoscope Size: Mac and 4 Grade View: Grade I Tube type: Oral Tube size: 8.0 mm Number of attempts: 1 Airway Equipment and Method: Stylet Placement Confirmation: ETT inserted through vocal cords under direct vision,  breath sounds checked- equal and bilateral and positive ETCO2 Secured at: 24 cm Tube secured with: Tape Dental Injury: Teeth and Oropharynx as per pre-operative assessment

## 2011-09-30 ENCOUNTER — Inpatient Hospital Stay (HOSPITAL_COMMUNITY): Payer: 59

## 2011-09-30 ENCOUNTER — Encounter (HOSPITAL_COMMUNITY): Payer: Self-pay | Admitting: Surgery

## 2011-09-30 LAB — BASIC METABOLIC PANEL
BUN: 62 mg/dL — ABNORMAL HIGH (ref 6–23)
Chloride: 98 mEq/L (ref 96–112)
Creatinine, Ser: 5.03 mg/dL — ABNORMAL HIGH (ref 0.50–1.35)
GFR calc Af Amer: 13 mL/min — ABNORMAL LOW (ref >90)
GFR calc non Af Amer: 11 mL/min — ABNORMAL LOW (ref >90)
Potassium: 4.6 mEq/L (ref 3.5–5.1)

## 2011-09-30 LAB — POCT I-STAT, CHEM 8
BUN: 83 mg/dL — ABNORMAL HIGH (ref 6–23)
Calcium, Ion: 1.24 mmol/L (ref 1.12–1.32)
Creatinine, Ser: 5.6 mg/dL — ABNORMAL HIGH (ref 0.50–1.35)
Sodium: 133 mEq/L — ABNORMAL LOW (ref 135–145)
TCO2: 22 mmol/L (ref 0–100)

## 2011-09-30 LAB — CBC
HCT: 22.4 % — ABNORMAL LOW (ref 39.0–52.0)
HCT: 22.3 % — ABNORMAL LOW (ref 39.0–52.0)
MCHC: 35.7 g/dL (ref 30.0–36.0)
MCV: 96.5 fL (ref 78.0–100.0)
Platelets: 67 10*3/uL — ABNORMAL LOW (ref 150–400)
RBC: 2.31 MIL/uL — ABNORMAL LOW (ref 4.22–5.81)
RDW: 14 % (ref 11.5–15.5)
RDW: 14.1 % (ref 11.5–15.5)
WBC: 6 10*3/uL (ref 4.0–10.5)
WBC: 7.4 10*3/uL (ref 4.0–10.5)

## 2011-09-30 LAB — GLUCOSE, CAPILLARY
Glucose-Capillary: 121 mg/dL — ABNORMAL HIGH (ref 70–99)
Glucose-Capillary: 136 mg/dL — ABNORMAL HIGH (ref 70–99)
Glucose-Capillary: 150 mg/dL — ABNORMAL HIGH (ref 70–99)
Glucose-Capillary: 170 mg/dL — ABNORMAL HIGH (ref 70–99)

## 2011-09-30 LAB — MAGNESIUM
Magnesium: 2.2 mg/dL (ref 1.5–2.5)
Magnesium: 2.3 mg/dL (ref 1.5–2.5)

## 2011-09-30 LAB — CREATININE, SERUM
GFR calc Af Amer: 11 mL/min — ABNORMAL LOW (ref >90)
GFR calc non Af Amer: 10 mL/min — ABNORMAL LOW (ref >90)

## 2011-09-30 MED ORDER — INSULIN ASPART 100 UNIT/ML ~~LOC~~ SOLN
0.0000 [IU] | SUBCUTANEOUS | Status: DC
  Administered 2011-09-30 (×2): 4 [IU] via SUBCUTANEOUS
  Administered 2011-09-30 – 2011-10-01 (×3): 2 [IU] via SUBCUTANEOUS

## 2011-09-30 MED ORDER — DARBEPOETIN ALFA-POLYSORBATE 60 MCG/0.3ML IJ SOLN
60.0000 ug | INTRAMUSCULAR | Status: DC
  Filled 2011-09-30: qty 0.3

## 2011-09-30 NOTE — Plan of Care (Signed)
Problem: Phase II Progression Outcomes Goal: Patient extubated within - Outcome: Completed/Met Date Met:  09/30/11 Extubated within 6 hours

## 2011-09-30 NOTE — Progress Notes (Signed)
This is a follow-up visit.  Pt is doing well.  He is excited that he is getting to walk today.  Offered support.  Will continue to follow.  Please page me if I am needed. [name]  [phone]  Personal pager   [phone] oncall pager

## 2011-09-30 NOTE — Progress Notes (Signed)
Patient ID: [name], male   DOB: February 05, 1951, 61 y.o.   MRN: [id number]  [facility] Progress Note    Subjective:   Feels great, extubated yesterday doing very well   Objective:   BP 129/65  Pulse 73  Temp 99 F (37.2 C) (Core (Comment))  Resp 18  Ht 6\' 4"  (1.93 m)  Wt 96.4 kg (212 lb 8.4 oz)  BMI 25.87 kg/m2  SpO2 100%  Physical Exam: Gen:WD WN WM in NAD CVS:RRR Resp:Cta [id number] AVF+T/B  Labs: BMET  Lab 09/30/11 0415 09/29/11 1834 09/29/11 1830 09/29/11 1255 09/29/11 1136 09/29/11 1027 09/29/11 1006 09/29/11 0951 09/29/11 0828 09/29/11 0500 09/28/11 0818 09/28/11 0515 09/27/11 0852  NA 135 134* -- 136 135 135 135 -- 136 -- -- -- --  K 4.6 4.7 -- 4.2 4.6 4.1 3.7 -- 3.6 -- -- -- --  CL 98 102 -- -- -- -- -- -- -- 95* 95* 97 99  CO2 23 -- -- -- -- -- -- -- -- 29 27 28  --  GLUCOSE 145* 128* -- 107* 126* 87 97 97 -- -- -- -- --  BUN 62* 57* -- -- -- -- -- -- -- 56* 101* 98* 84*  CREATININE 5.03* 4.30* 4.32* -- -- -- -- -- -- 4.61* 6.21* 6.02* 4.40*  ALBUMIN -- -- -- -- -- -- -- -- -- -- 3.5 3.5 --  CALCIUM 8.6 -- -- -- -- -- -- -- -- 9.4 9.6 9.7 --  PHOS -- -- -- -- -- -- -- -- -- -- 6.7* 6.6* --   CBC  Lab 09/30/11 0415 09/29/11 1834 09/29/11 1830 09/29/11 1310 09/29/11 1040 09/29/11 0500  WBC 6.0 -- 13.1* 8.2 -- 4.4  NEUTROABS -- -- -- -- -- --  HGB 8.0* 8.8* 9.7* 9.9* -- --  HCT 22.4* 26.0* 27.5* 28.1* -- --  MCV 96.6 -- 96.5 95.9 -- 97.2  PLT 62* -- 123* 119* 103* --    @IMGRELPRIORS @ Medications:      . acetaminophen (TYLENOL) oral liquid 160 mg/5 mL  650 mg Per Tube NOW   Or  . acetaminophen  650 mg Rectal NOW  . acetaminophen  1,000 mg Oral Q6H   Or  . acetaminophen (TYLENOL) oral liquid 160 mg/5 mL  975 mg Per Tube Q6H  . aspirin EC  325 mg Oral Daily   Or  . aspirin  324 mg Per Tube Daily  . atorvastatin  10 mg Oral QHS  . bisacodyl  10 mg Oral Daily   Or  . bisacodyl  10 mg Rectal Daily  . calcium acetate   1,334 mg Oral TID WC  . cefUROXime (ZINACEF)  IV  1.5 g Intravenous To OR  . cefUROXime (ZINACEF)  IV  1.5 g Intravenous Q12H  . darbepoetin (ARANESP) injection - DIALYSIS  25 mcg Intravenous Q Fri-HD  . docusate sodium  200 mg Oral Daily  . famotidine (PEPCID) IV  20 mg Intravenous Once  . finasteride  5 mg Oral QHS  . insulin aspart  0-24 Units Subcutaneous Q2H  . insulin aspart  0-24 Units Subcutaneous Q4H  . insulin regular  0-10 Units Intravenous TID WC  . iron dextran complex  50 mg Intravenous Q Wed-HD  . magnesium sulfate  4 g Intravenous Once  . metoprolol tartrate  12.5 mg Oral BID   Or  . metoprolol tartrate  12.5 mg Per Tube BID  . multivitamin  1 tablet Oral QHS  . nitroGLYCERIN  2-200 mcg/min Intravenous To OR  . pantoprazole  40 mg Oral Q1200  . paricalcitol  1 mcg Intravenous 3 times weekly  . phenylephrine (NEO-SYNEPHRINE) Adult infusion  30-200 mcg/min Intravenous To OR  . sodium bicarbonate  50 mEq Intravenous Once  . sodium chloride  3 mL Intravenous Q12H  . vancomycin  1,000 mg Intravenous Once  . DISCONTD: aspirin EC  81 mg Oral Daily  . DISCONTD: bisacodyl  5 mg Oral Once  . DISCONTD: cefUROXime (ZINACEF)  IV  750 mg Intravenous To OR  . DISCONTD: diazepam  5 mg Oral Once  . DISCONTD: DOPamine  2-20 mcg/kg/min Intravenous To OR  . DISCONTD: epinephrine  0.5-20 mcg/min Intravenous To OR  . DISCONTD: insulin aspart  0-24 Units Subcutaneous Q4H  . DISCONTD: insulin aspart  0-9 Units Subcutaneous TID WC  . DISCONTD: magnesium sulfate  40 mEq Other To OR  . DISCONTD: potassium chloride  10 mEq Intravenous Q1 Hr x 3  . DISCONTD: potassium chloride  80 mEq Other To OR  . DISCONTD: tranexamic acid  2 mg/kg Intracatheter To OR  . DISCONTD: vancomycin  1,000 mg Intravenous Once     Assessment/ Plan:   1. CAD s/p CABG- has done extremely well post op.  Cont with progression 2. ESRD will hold off on HD today and plan for tomorrow then resume MWF schedule next  week 3. Anemia: ABLA- cont to follow and increase epo 4. Vascular access- stable 5. DM- stable 6. Nutrition:advance diet 7. Hypertension:stable  Isaiah Cianci A 09/30/2011, 10:19 AM

## 2011-09-30 NOTE — Progress Notes (Signed)
1 Day Post-Op Procedure(s) (LRB): CORONARY ARTERY BYPASS GRAFTING (CABG) (N/A) Subjective: No complaints  Objective: Vital signs in last 24 hours: Temp:  [98.2 F (36.8 C)-99.5 F (37.5 C)] 98.8 F (37.1 C) (06/28 0715) Pulse Rate:  [60-85] 72  (06/28 0715) Cardiac Rhythm:  [-] A-V Sequential paced (06/28 0700) Resp:  [0-28] 22  (06/28 0715) BP: (85-123)/(43-71) 110/54 mmHg (06/28 0715) SpO2:  [97 %-100 %] 100 % (06/28 0715) Arterial Line BP: (89-155)/(47-67) 152/52 mmHg (06/28 0715) FiO2 (%):  [40 %-50 %] 40 % (06/27 1700) Weight:  [96.4 kg (212 lb 8.4 oz)] 96.4 kg (212 lb 8.4 oz) (06/28 0600)  Hemodynamic parameters for last 24 hours: PAP: (12-40)/(2-22) 16/7 mmHg CO:  [3.3 L/min-6.5 L/min] 6.4 L/min CI:  [1.5 L/min/m2-3 L/min/m2] 2.9 L/min/m2  Intake/Output from previous day: 06/27 0701 - 06/28 0700 In: 4672.2 [I.V.:3572.2; Blood:300; IV Piggyback:800] Out: [id number] [Urine:655; Blood:80; Chest Tube:680] Intake/Output this shift:    General appearance: alert and cooperative Neurologic: intact Heart: regular rate and rhythm, S1, S2 normal, no murmur, click, rub or gallop Lungs: clear to auscultation bilaterally Extremities: edema mild Wound: dressing dry  Lab Results:  Basename 09/30/11 0415 09/29/11 1834 09/29/11 1830  WBC 6.0 -- 13.1*  HGB 8.0* 8.8* --  HCT 22.4* 26.0* --  PLT 62* -- 123*   BMET:  Basename 09/30/11 0415 09/29/11 1834 09/29/11 0500  NA 135 134* --  K 4.6 4.7 --  CL 98 102 --  CO2 23 -- 29  GLUCOSE 145* 128* --  BUN 62* 57* --  CREATININE 5.03* 4.30* --  CALCIUM 8.6 -- 9.4    PT/INR:  Basename 09/29/11 1310  LABPROT 16.1*  INR 1.26   ABG    Component Value Date/Time   PHART 7.294* 09/29/2011 1830   HCO3 23.0 09/29/2011 1830   TCO2 22 09/29/2011 1834   ACIDBASEDEF 3.0* 09/29/2011 1830   O2SAT 100.0 09/29/2011 1830   CBG (last 3)   Basename 09/30/11 0725 09/30/11 0423 09/29/11 2359  GLUCAP 121* 136* 170*    Assessment/Plan: S/P  Procedure(s) (LRB): CORONARY ARTERY BYPASS GRAFTING (CABG) (N/A) ESRD:  HD per nephrology DC tubes and lines Thrombocytopenia:  Observe. Probably related to heparin but seems too early for HIT. Mobilize Diabetes control See progression orders   LOS: 3 days    Andersyn Fragoso K 09/30/2011

## 2011-09-30 NOTE — Care Management Note (Unsigned)
    Page 1 of 1   10/04/2011     10:49:19 AM   CARE MANAGEMENT NOTE 10/04/2011  Patient:  [name], [name]   Account Number:  [id number]  Date Initiated:  09/30/2011  Documentation initiated by:  Moritz Lever  Subjective/Objective Assessment:   PT S/P CABG X2 ON 09/29/11.  PT IS BLIND WITH CHRONIC RENAL FAILURE; HE IS AWAITING RENAL TRANSPLANT AND HAS DIALYSIS TUES, THURS AND SAT.  HE LIVES WITH SUPPORTIVE WIFE.     Action/Plan:   MET WITH PT TO DISCUSS DC PLANS.  PT STATES WIFE TO PROVIDE 24HR CARE AT DC.  HE HAS VISIT Q 3MOS FROM [employer] [name], AND HAS USED [employer] FOR PCS IN THE PAST.  HE HAS A WALKER, IF NEEDED.  WILL FOLLOW FOR HOME NEEDS.   Anticipated DC Date:  10/04/2011   Anticipated DC Plan:  [place]  CM consult      Choice offered to / List presented to:             Status of service:  In process, will continue to follow Medicare Important Message given?   (If response is "NO", the following Medicare IM given date fields will be blank) Date Medicare IM given:   Date Additional Medicare IM given:    Discharge Disposition:    Per UR Regulation:    If discussed at Long Length of Stay Meetings, dates discussed:    Comments:  10/04/11 Nicholas Demos,[name],[name] [place]; [place] AFTER HD.  SPOKE WITH PT'S WIFE [name] VIA PHONE TO [place].  SHE STATES SHE WORKS AT HOME AND WILL BE THERE WITH PT 24HRS A DAY.  SHE STATES PT HAS BSC, RW, WC AND SHOWER CHAIR AT HOME.  SHE DENIES ANY HOME NEEDS AT THIS TIME, BUT WILL NOTIFY ME IF ANY ARE NEEDED.  WILL FOLLOW.

## 2011-09-30 NOTE — Progress Notes (Signed)
. Appreciate excellent surgical care. Seems alert and maintaining NSR.  PLT CT is down, and agree--not clear source.  Seems unusual if he gets heparin routinely for his dialysis.    Our team will follow.

## 2011-09-30 NOTE — Op Note (Signed)
NAME:  [name], [name]               ACCOUNT NO.:  [id number]  MEDICAL RECORD NO.:  [id number]  LOCATION:  2301                         FACILITY:  [facility]  PHYSICIAN:  Gilford Raid, M.D.     DATE OF BIRTH:  08/02/1950  DATE OF PROCEDURE:  09/29/2011 DATE OF DISCHARGE:                              OPERATIVE REPORT   PREOPERATIVE DIAGNOSIS:  Severe multivessel coronary artery disease with abnormal stress test.  POSTOPERATIVE DIAGNOSIS:  Severe multivessel coronary artery disease with abnormal stress test.  OPERATIVE PROCEDURE:  Median sternotomy, extracorporeal circulation, coronary artery bypass graft surgery x2 using a left internal mammary artery graft to the left anterior descending coronary artery, and a saphenous vein graft to the obtuse marginal branch of the left circumflex coronary artery.  Endoscopic vein harvesting from the right leg.  ATTENDING SURGEON:  Gilford Raid, MD  ASSISTANT:  Lars Pinks, PA-C  ANESTHESIA:  General endotracheal.  CLINICAL HISTORY:  This patient is a 61 year old gentleman with history of diabetes, hypertension, and end-stage renal disease, on hemodialysis. He was undergoing evaluation for a kidney transplant and had an abnormal Myoview stress test showing mild apical, apical-lateral, and anterolateral ischemia.  Ejection fraction was 49%.  Cardiac catheterization showed about 80% proximal LAD stenosis with diffusely calcified proximal LAD.  The left circumflex had 95% proximal stenosis. The right coronary artery had 40-50% nonobstructive disease.  After review of the catheterization and examination of the patient, it was felt that coronary artery bypass graft surgery is the best treatment to prevent for further ischemia and infarction.  I discussed the operative procedure with the patient and his wife including alternatives, benefits, and risks including, but not limited to bleeding, blood transfusion, infection, stroke, myocardial  infarction, graft failure, and death.  He understood and agreed to proceed.  OPERATIVE PROCEDURE:  The patient was taken to the operative room and placed on the table in supine position.  After induction of general endotracheal anesthesia, a Foley catheter was placed in the bladder using sterile technique.  Then, the chest, abdomen, and both lower extremities were prepped and draped in usual sterile manner.  The chest was entered through a median sternotomy incision and the pericardium opened in the midline.  Examination of the heart showed good ventricular contractility.  The ascending aorta had no palpable plaques in it.  Then, the left internal mammary artery was harvested from the chest wall as a pedicle graft.  This was a medium-caliber vessel with excellent blood flow through it.  At the same time, a segment of greater saphenous vein was harvested from the right thigh using endoscopic vein harvest technique.  This vein was of medium-caliber and good quality.  Then, the patient was heparinized when an adequate ACT was obtained. The distal ascending aorta was cannulated using a 20-French aortic cannula for arterial inflow.  Venous outflow was achieved using a two- stage venous cannula for the right atrial appendage.  An antegrade cardioplegia and vent cannula was inserted in the aortic root.  The patient was placed on cardiopulmonary bypass and distal coronary arteries were identified.  The LAD was diffusely diseased throughout its proximal and midportions with calcific plaque.  The only soft area  in the vessel was distally toward the apex.  It was still a moderately large vessel in this location and suitable for grafting.  The obtuse marginal was a large bifurcating vessel that was also diffusely diseased with calcific plaque.  There was one area distally in the more lateral subbranch that was soft enough to graft.  Then, the aorta was crossclamped and 1000 mL of cold blood  antegrade cardioplegia was administered in the aortic root with quick arrest of the heart.  Systemic hypothermia to 28 degrees centigrade and topical hypothermia with iced saline was used.  A temperature probe was placed in the septum and insulating the pad in the pericardium.  The first distal anastomosis was performed to the obtuse marginal branch.  The internal diameter distally was about 1.75 mm.  The conduit used was a segment of greater saphenous vein and anastomosis was performed in an end-to-side manner using continuous 7-0 Prolene suture. Flow was noted through the graft and was excellent.  The second distal anastomosis was performed to the distal LAD.  The internal diameter here was 1.75 mm.  The conduit used was the left internal mammary graft and was brought through an opening in the left pericardium anterior to the phrenic nerve.  It was anastomosed the LAD in an end-to-side manner using continuous 8-0 Prolene suture.  The pedicle was sutured to the epicardium with 6-0 Prolene sutures.  The patient was given another dose of cardioplegia and then, the single proximal vein graft anastomosis was performed to the mid-ascending aorta in an end-to-side manner using continuous 6-0 Prolene suture.  Then, the clamp was removed from the mammary pedicle.  There was rapid warming of the ventricular septum and return of spontaneous ventricular fibrillation.  The crossclamp was removed with the time of 41 minutes. The patient was defibrillated into sinus rhythm.  The proximal and distal anastomoses were appeared hemostatic and allowed the grafts satisfactory.  Graft marker was placed around the proximal anastomosis.  Two temporary right ventricular and right atrial pacing wires were placed and brought out through the skin.  When the patient was rewarmed to 37 degrees centigrade, he was weaned from cardiopulmonary bypass on no inotropic agents.  Total bypass time was 58 minutes.   Cardiac function appeared good.  Protamine was given and the venous and aortic cannulae were removed without difficulty. Hemostasis was achieved.  Three chest tubes were placed with tube in the posterior pericardium, one in the left pleural space and one in the anterior mediastinum.  The pericardium was reapproximated over the heart.  The sternum was closed with double #6 stainless steel wires. The fascia was closed with continuous #1 Vicryl suture.  The subcutaneous tissue was closed with continuous 2-0 Vicryl and the skin with a 3-0 Vicryl subcuticular closure.  The sponge, needle, and instrument counts were correct according to the scrub nurse.  Dry sterile dressing was applied over the incisions around the chest tubes, which were hooked to Pleur-Evac suction.  The patient remained hemodynamically stable and was transported to the SICU in guarded, but stable condition.     Gilford Raid, M.D.     BB/MEDQ  D:  09/29/2011  T:  09/30/2011  Job:  [id number]

## 2011-09-30 NOTE — Progress Notes (Signed)
Patient examined and record reviewed.Hemodynamics stable,labs satisfactory.Patient had stable day.Continue current care.  Hb this pm 7.9 [name],[name] 09/30/2011

## 2011-10-01 ENCOUNTER — Inpatient Hospital Stay (HOSPITAL_COMMUNITY): Payer: 59

## 2011-10-01 LAB — GLUCOSE, CAPILLARY
Glucose-Capillary: 141 mg/dL — ABNORMAL HIGH (ref 70–99)
Glucose-Capillary: 155 mg/dL — ABNORMAL HIGH (ref 70–99)

## 2011-10-01 LAB — BASIC METABOLIC PANEL
BUN: 80 mg/dL — ABNORMAL HIGH (ref 6–23)
GFR calc non Af Amer: 9 mL/min — ABNORMAL LOW (ref >90)
Glucose, Bld: 93 mg/dL (ref 70–99)
Potassium: 4.7 mEq/L (ref 3.5–5.1)

## 2011-10-01 LAB — CBC
Hemoglobin: 6.8 g/dL — CL (ref 13.0–17.0)
MCH: 34 pg (ref 26.0–34.0)
MCHC: 35.2 g/dL (ref 30.0–36.0)

## 2011-10-01 MED ORDER — SODIUM CHLORIDE 0.9 % IJ SOLN
3.0000 mL | Freq: Two times a day (BID) | INTRAMUSCULAR | Status: DC
  Administered 2011-10-01 – 2011-10-05 (×5): 3 mL via INTRAVENOUS

## 2011-10-01 MED ORDER — MOVING RIGHT ALONG BOOK
Freq: Once | Status: AC
  Administered 2011-10-01: 20:00:00
  Filled 2011-10-01: qty 1

## 2011-10-01 MED ORDER — ASPIRIN EC 81 MG PO TBEC
81.0000 mg | DELAYED_RELEASE_TABLET | Freq: Every day | ORAL | Status: DC
  Administered 2011-10-02 – 2011-10-05 (×4): 81 mg via ORAL
  Filled 2011-10-01 (×4): qty 1

## 2011-10-01 MED ORDER — SODIUM CHLORIDE 0.9 % IV SOLN: 250.0000 mL | INTRAVENOUS | Status: DC | PRN

## 2011-10-01 MED ORDER — SODIUM CHLORIDE 0.9 % IJ SOLN: 3.0000 mL | INTRAMUSCULAR | Status: DC | PRN

## 2011-10-01 MED ORDER — DIPHENHYDRAMINE HCL 50 MG/ML IJ SOLN: 25.0000 mg | INTRAMUSCULAR | Status: DC | PRN

## 2011-10-01 MED ORDER — ASPIRIN 81 MG PO CHEW: 81.0000 mg | CHEWABLE_TABLET | Freq: Every day | ORAL | Status: DC

## 2011-10-01 MED ORDER — INSULIN ASPART 100 UNIT/ML ~~LOC~~ SOLN
0.0000 [IU] | Freq: Three times a day (TID) | SUBCUTANEOUS | Status: DC
  Administered 2011-10-01 – 2011-10-02 (×2): 2 [IU] via SUBCUTANEOUS
  Administered 2011-10-02: 8 [IU] via SUBCUTANEOUS
  Administered 2011-10-02 – 2011-10-03 (×3): 4 [IU] via SUBCUTANEOUS
  Administered 2011-10-04: 2 [IU] via SUBCUTANEOUS
  Administered 2011-10-04: 4 [IU] via SUBCUTANEOUS
  Administered 2011-10-04 – 2011-10-05 (×2): 2 [IU] via SUBCUTANEOUS

## 2011-10-01 NOTE — Progress Notes (Signed)
Patient ID: [name], male   DOB: 1950-08-29, 61 y.o.   MRN: [id number]   [facility] Progress Note    Subjective:   Feels good   Objective:   BP 103/50  Pulse 62  Temp 98.7 F (37.1 C) (Oral)  Resp 13  Ht 6\' 4"  (1.93 m)  Wt 98 kg (216 lb 0.8 oz)  BMI 26.30 kg/m2  SpO2 98%  Physical Exam: Gen:WD WN WM in NAD CVS:RRR Resp:CTA [id number] Ext:no edema, AVF +T/B  Labs: BMET  Lab 10/01/11 0438 09/30/11 1609 09/30/11 1600 09/30/11 0415 09/29/11 1834 09/29/11 1830 09/29/11 1255 09/29/11 1136 09/29/11 1027 09/29/11 0500 09/28/11 0818 09/28/11 0515  NA 134* 133* -- 135 134* -- 136 135 135 -- -- --  K 4.7 4.8 -- 4.6 4.7 -- 4.2 4.6 4.1 -- -- --  CL 95* 96 -- 98 102 -- -- -- -- 95* 95* 97  CO2 24 -- -- 23 -- -- -- -- -- 29 27 28   GLUCOSE 93 157* -- 145* 128* -- 107* 126* 87 -- -- --  BUN 80* 83* -- 62* 57* -- -- -- -- 56* 101* 98*  CREATININE 6.13* 5.60* 5.73* 5.03* 4.30* 4.32* -- -- -- 4.61* -- --  ALBUMIN -- -- -- -- -- -- -- -- -- -- 3.5 3.5  CALCIUM 8.7 -- -- 8.6 -- -- -- -- -- 9.4 9.6 9.7  PHOS -- -- -- -- -- -- -- -- -- -- 6.7* 6.6*   CBC  Lab 10/01/11 0438 09/30/11 1609 09/30/11 1600 09/30/11 0415 09/29/11 1830  WBC 4.8 -- 7.4 6.0 13.1*  NEUTROABS -- -- -- -- --  HGB 6.8* 7.1* 7.9* 8.0* --  HCT 19.3* 21.0* 22.3* 22.4* --  MCV 96.5 -- 96.5 96.6 96.5  PLT 53* -- 67* 62* 123*    @IMGRELPRIORS @ Medications:      . acetaminophen  1,000 mg Oral Q6H   Or  . acetaminophen (TYLENOL) oral liquid 160 mg/5 mL  975 mg Per Tube Q6H  . aspirin EC  325 mg Oral Daily   Or  . aspirin  324 mg Per Tube Daily  . atorvastatin  10 mg Oral QHS  . bisacodyl  10 mg Oral Daily   Or  . bisacodyl  10 mg Rectal Daily  . calcium acetate  1,334 mg Oral TID WC  . cefUROXime (ZINACEF)  IV  1.5 g Intravenous Q12H  . darbepoetin (ARANESP) injection - DIALYSIS  60 mcg Intravenous Q Fri-HD  . docusate sodium  200 mg Oral Daily  . finasteride  5 mg Oral QHS  . insulin  aspart  0-24 Units Subcutaneous Q4H  . iron dextran complex  50 mg Intravenous Q Wed-HD  . magnesium sulfate  4 g Intravenous Once  . metoprolol tartrate  12.5 mg Oral BID   Or  . metoprolol tartrate  12.5 mg Per Tube BID  . multivitamin  1 tablet Oral QHS  . pantoprazole  40 mg Oral Q1200  . paricalcitol  1 mcg Intravenous 3 times weekly  . sodium chloride  3 mL Intravenous Q12H  . DISCONTD: darbepoetin (ARANESP) injection - DIALYSIS  60 mcg Intravenous Q Fri-HD  . DISCONTD: insulin regular  0-10 Units Intravenous TID WC     Assessment/ Plan:   1. CAD s/p CABG- has done extremely well post op. Cont with progression 2. ESRD off schedule.  Will have HD today then resume MWF schedule next week 3. Anemia: ABLA- cont to follow  and increase epo and transfuse with HD today 4. Vascular access- stable 5. DM- stable 6. Nutrition:advance diet 7. Hypertension:stable 8. Dispo- tx to SDU Alfie Rideaux A 10/01/2011, 11:23 AM

## 2011-10-01 NOTE — Progress Notes (Signed)
2 Days Post-Op Procedure(s) (LRB): CORONARY ARTERY BYPASS GRAFTING (CABG) (N/A)                    [address]            [place] [address]          [phone]     Subjective:S/P CABG chroni HD Postop blood loss anemia- Hb 6.5 will give 2 u PRBC' w/ HD  Objective: Vital signs in last 24 hours: Temp:  [97.5 F (36.4 C)-98.9 F (37.2 C)] 98.7 F (37.1 C) (06/29 0722) Pulse Rate:  [55-78] 62  (06/29 1000) Cardiac Rhythm:  [-] Normal sinus rhythm;Heart block (06/29 0800) Resp:  [2-19] 13  (06/29 1000) BP: (90-121)/(47-73) 103/50 mmHg (06/29 1000) SpO2:  [91 %-100 %] 98 % (06/29 1000) Weight:  [216 lb 0.8 oz (98 kg)] 216 lb 0.8 oz (98 kg) (06/29 0600)  Hemodynamic parameters for last 24 hours:  NSR  Intake/Output from previous day: 06/28 0701 - 06/29 0700 In: 1000 [P.O.:420; I.V.:480; IV Piggyback:100] Out: 300 [Urine:280; Chest Tube:20] Intake/Output this shift: Total I/O In: 270 [P.O.:180; I.V.:40; IV Piggyback:50] Out: 60 [Urine:60]  Lungs clear  Incision clean  Lab Results:  Basename 10/01/11 0438 09/30/11 1609 09/30/11 1600  WBC 4.8 -- 7.4  HGB 6.8* 7.1* --  HCT 19.3* 21.0* --  PLT 53* -- 67*   BMET:  Basename 10/01/11 0438 09/30/11 1609 09/30/11 0415  NA 134* 133* --  K 4.7 4.8 --  CL 95* 96 --  CO2 24 -- 23  GLUCOSE 93 157* --  BUN 80* 83* --  CREATININE 6.13* 5.60* --  CALCIUM 8.7 -- 8.6    PT/INR:  Basename 09/29/11 1310  LABPROT 16.1*  INR 1.26   ABG    Component Value Date/Time   PHART 7.294* 09/29/2011 1830   HCO3 23.0 09/29/2011 1830   TCO2 22 09/30/2011 1609   ACIDBASEDEF 3.0* 09/29/2011 1830   O2SAT 100.0 09/29/2011 1830   CBG (last 3)   Basename 10/01/11 0720 10/01/11 0429 09/30/11 2330  GLUCAP 104* 99 155*    Assessment/Plan: S/P Procedure(s) (LRB): CORONARY ARTERY BYPASS GRAFTING (CABG) (N/A) Plan for transfer to step-down: see transfer orders Follow low plt count- hold ASA  LOS: 4 days    [name]  [name],[name] 10/01/2011

## 2011-10-02 ENCOUNTER — Inpatient Hospital Stay (HOSPITAL_COMMUNITY): Payer: 59

## 2011-10-02 LAB — BASIC METABOLIC PANEL
BUN: 41 mg/dL — ABNORMAL HIGH (ref 6–23)
CO2: 28 mEq/L (ref 19–32)
Calcium: 8.5 mg/dL (ref 8.4–10.5)
Chloride: 100 mEq/L (ref 96–112)
Creatinine, Ser: 4.01 mg/dL — ABNORMAL HIGH (ref 0.50–1.35)
GFR calc Af Amer: 17 mL/min — ABNORMAL LOW (ref >90)
GFR calc non Af Amer: 15 mL/min — ABNORMAL LOW (ref >90)
Glucose, Bld: 118 mg/dL — ABNORMAL HIGH (ref 70–99)
Potassium: 3.8 mEq/L (ref 3.5–5.1)
Sodium: 137 mEq/L (ref 135–145)

## 2011-10-02 LAB — TYPE AND SCREEN
Unit division: 0
Unit division: 0
Unit division: 0

## 2011-10-02 LAB — CBC
HCT: 22.1 % — ABNORMAL LOW (ref 39.0–52.0)
Hemoglobin: 7.9 g/dL — ABNORMAL LOW (ref 13.0–17.0)
MCH: 33.5 pg (ref 26.0–34.0)
MCHC: 35.7 g/dL (ref 30.0–36.0)
MCV: 93.6 fL (ref 78.0–100.0)
Platelets: 50 10*3/uL — ABNORMAL LOW (ref 150–400)
RBC: 2.36 MIL/uL — ABNORMAL LOW (ref 4.22–5.81)
RDW: 16.2 % — ABNORMAL HIGH (ref 11.5–15.5)
WBC: 5 10*3/uL (ref 4.0–10.5)

## 2011-10-02 LAB — GLUCOSE, CAPILLARY
Glucose-Capillary: 125 mg/dL — ABNORMAL HIGH (ref 70–99)
Glucose-Capillary: 151 mg/dL — ABNORMAL HIGH (ref 70–99)
Glucose-Capillary: 230 mg/dL — ABNORMAL HIGH (ref 70–99)

## 2011-10-02 MED ORDER — SORBITOL 70 % PO SOLN
15.0000 mL | Freq: Once | ORAL | Status: DC
  Filled 2011-10-02: qty 15

## 2011-10-02 MED ORDER — SORBITOL 70 % SOLN
15.0000 mL | Freq: Once | Status: AC
  Administered 2011-10-02: 15 mL via ORAL
  Filled 2011-10-02: qty 30

## 2011-10-02 NOTE — Progress Notes (Signed)
3 Days Post-Op Procedure(s) (LRB): CORONARY ARTERY BYPASS GRAFTING (CABG) (N/A)  Subjective: Patient passing flatus but no bowel movement yet.  Objective: Vital signs in last 24 hours: Patient Vitals for the past 24 hrs:  BP Temp Temp src Pulse Resp SpO2 Weight  10/02/11 0424 97/59 mmHg 98.9 F (37.2 C) Oral 67  19  92 % 219 lb 9.3 oz (99.6 kg)  10/01/11 1952 - - - 71  16  97 % -  10/01/11 1925 121/73 mmHg 98.3 F (36.8 C) Oral 72  18  98 % -  10/01/11 1639 125/67 mmHg 98.2 F (36.8 C) Oral 67  13  94 % 218 lb 14.7 oz (99.3 kg)  10/01/11 1630 115/65 mmHg 98.2 F (36.8 C) Oral 69  17  - -  10/01/11 1600 116/61 mmHg 97.8 F (36.6 C) Oral 70  14  - -  10/01/11 1546 112/63 mmHg 97.8 F (36.6 C) Oral 68  12  - -  10/01/11 1530 112/100 mmHg - - 70  20  - -  10/01/11 1503 110/61 mmHg 98.2 F (36.8 C) Oral 71  18  - -  10/01/11 1447 102/52 mmHg 98.2 F (36.8 C) Oral 71  21  - -  10/01/11 1430 91/52 mmHg - - 68  14  - -  10/01/11 1400 104/59 mmHg - - 70  14  - -  10/01/11 1330 106/61 mmHg - - 71  12  - -  10/01/11 1300 109/60 mmHg - - 70  16  - -  10/01/11 1245 101/56 mmHg - - 73  15  - -  10/01/11 1230 104/59 mmHg - - 69  16  - -  10/01/11 1215 94/59 mmHg 98.1 F (36.7 C) Oral 66  19  100 % 220 lb 14.4 oz (100.2 kg)  10/01/11 1132 - 98.7 F (37.1 C) Oral - - - -  10/01/11 1100 102/52 mmHg - - 63  19  100 % -  10/01/11 1000 103/50 mmHg - - 62  13  98 % -  10/01/11 0900 101/55 mmHg - - 71  17  100 % -   Pre op weight  88 kg Current Weight  10/02/11 219 lb 9.3 oz (99.6 kg)      Intake/Output from previous day: 06/29 0701 - 06/30 0700 In: 970 [P.O.:180; I.V.:40; Blood:700; IV Piggyback:50] Out: 2069 [Urine:60]   Physical Exam:  Cardiovascular: RRR, no murmurs, gallops, or rubs. Pulmonary: Clear to auscultation bilaterally; no rales, wheezes, or rhonchi. Abdomen: Soft, non tender, bowel sounds present. Extremities: Mild bilateral lower extremity edema. Wounds: Clean and  dry.  No erythema or signs of infection.  Lab Results: CBC: Basename 10/02/11 0508 10/01/11 0438  WBC 5.0 4.8  HGB 7.9* 6.8*  HCT 22.1* 19.3*  PLT 50* 53*   BMET:  Basename 10/02/11 0508 10/01/11 0438  NA 137 134*  K 3.8 4.7  CL 100 95*  CO2 28 24  GLUCOSE 118* 93  BUN 41* 80*  CREATININE 4.01* 6.13*  CALCIUM 8.5 8.7   Assessment/Plan:  1. CV - Missed beat last evening.Otherwise, maintaining SR.On Lopressor 12.5 bid. Will hold today as HR and SBP low. 2.  Pulmonary - Encourage incentive spirometer.CXR this am shows patient rotated to the right,improvement in aeration, no ptx, bibasilar atelectasis. 3. Volume Overload - Per Nephrology. 4.Anemia-H and H this am up to 7.9 and 22.1. Given 2 units of PRBCs yesterday. On iron dextran and Aranesp. 5.Thrombocytopenia-Platelets remaining 50,000.Aspririn on hold. 6.ESRD-Creatinine  down to 4.01.Had HD yesterday.Scheduled for M/W/F per Dr. Marval Regal. 7.CBGs have been 155 or less. HGA1C pre op is 6.Will need follow up as outpatient. SS PRN. 8.LOC constipation.  Lesslie Mckeehan MPA-C 10/02/2011

## 2011-10-02 NOTE — Progress Notes (Signed)
Patient ambulated 550 ft with RW x1 assist twice today. Tolerated well, no complaints, no stops. Erwin Nishiyama, Chauncey Reading

## 2011-10-02 NOTE — Plan of Care (Signed)
Problem: Phase III Progression Outcomes Goal: Time patient transferred to PCTU/Telemetry POD Outcome: Completed/Met Date Met:  10/01/11 1929 transferred to 2000

## 2011-10-02 NOTE — Progress Notes (Signed)
Patient ID: [name], male   DOB: 04/10/1950, 61 y.o.   MRN: [id number]  [facility] Progress Note    Subjective:   Feels good   Objective:   BP 97/59  Pulse 67  Temp 98.9 F (37.2 C) (Oral)  Resp 19  Ht 6\' 4"  (1.93 m)  Wt 99.6 kg (219 lb 9.3 oz)  BMI 26.73 kg/m2  SpO2 92%  Physical Exam: Gen:WD WN WM in NAD CVS:RRR Resp:CTA [id number] Ext:no edema, L forearm AVF +T/B  Labs: BMET  Lab 10/02/11 0508 10/01/11 0438 09/30/11 1609 09/30/11 1600 09/30/11 0415 09/29/11 1834 09/29/11 1830 09/29/11 1255 09/29/11 1136 09/29/11 0500 09/28/11 0818 09/28/11 0515  NA 137 134* 133* -- 135 134* -- 136 135 -- -- --  K 3.8 4.7 4.8 -- 4.6 4.7 -- 4.2 4.6 -- -- --  CL 100 95* 96 -- 98 102 -- -- -- 95* 95* --  CO2 28 24 -- -- 23 -- -- -- -- 29 27 28   GLUCOSE 118* 93 157* -- 145* 128* -- 107* 126* -- -- --  BUN 41* 80* 83* -- 62* 57* -- -- -- 56* 101* --  CREATININE 4.01* 6.13* 5.60* 5.73* 5.03* 4.30* 4.32* -- -- -- -- --  ALBUMIN -- -- -- -- -- -- -- -- -- -- 3.5 3.5  CALCIUM 8.5 8.7 -- -- 8.6 -- -- -- -- 9.4 9.6 9.7  PHOS -- -- -- -- -- -- -- -- -- -- 6.7* 6.6*   CBC  Lab 10/02/11 0508 10/01/11 0438 09/30/11 1609 09/30/11 1600 09/30/11 0415  WBC 5.0 4.8 -- 7.4 6.0  NEUTROABS -- -- -- -- --  HGB 7.9* 6.8* 7.1* 7.9* --  HCT 22.1* 19.3* 21.0* 22.3* --  MCV 93.6 96.5 -- 96.5 96.6  PLT 50* 53* -- 67* 62*    @IMGRELPRIORS @ Medications:      . acetaminophen  1,000 mg Oral Q6H   Or  . acetaminophen (TYLENOL) oral liquid 160 mg/5 mL  975 mg Per Tube Q6H  . aspirin EC  81 mg Oral Daily   Or  . aspirin  81 mg Per Tube Daily  . atorvastatin  10 mg Oral QHS  . bisacodyl  10 mg Oral Daily   Or  . bisacodyl  10 mg Rectal Daily  . calcium acetate  1,334 mg Oral TID WC  . cefUROXime (ZINACEF)  IV  1.5 g Intravenous Q12H  . darbepoetin (ARANESP) injection - DIALYSIS  60 mcg Intravenous Q Fri-HD  . docusate sodium  200 mg Oral Daily  . finasteride  5 mg Oral QHS  .  insulin aspart  0-24 Units Subcutaneous TID AC & HS  . iron dextran complex  50 mg Intravenous Q Wed-HD  . magnesium sulfate  4 g Intravenous Once  . metoprolol tartrate  12.5 mg Oral BID   Or  . metoprolol tartrate  12.5 mg Per Tube BID  . moving right along book   Does not apply Once  . multivitamin  1 tablet Oral QHS  . pantoprazole  40 mg Oral Q1200  . paricalcitol  1 mcg Intravenous 3 times weekly  . sodium chloride  3 mL Intravenous Q12H  . sorbitol  15 mL Oral Once  . DISCONTD: aspirin  324 mg Per Tube Daily  . DISCONTD: aspirin EC  325 mg Oral Daily  . DISCONTD: insulin aspart  0-24 Units Subcutaneous Q4H  . DISCONTD: insulin regular  0-10 Units Intravenous TID WC  .  DISCONTD: sodium chloride  3 mL Intravenous Q12H  . DISCONTD: sorbitol  15 mL Oral Once     Assessment/ Plan:   1. CAD s/p CABG- has done extremely well post op. Cont with progression 2. ESRD off schedule. Will resume MWF schedule tomorrow 3. Anemia: ABLA- cont to follow and increase epo and transfuse with HD yesterday 4. Vascular access- stable 5. DM- stable 6. Nutrition:advance diet 7. Hypertension:stable 8. Dispo- tx to SDU 9.   Shariece Viveiros A 10/02/2011, 10:32 AM

## 2011-10-02 NOTE — Progress Notes (Signed)
Third walk of the day, patient ambulated 55O ft with RW x 1 assist, no complaints, no stops. [name], Chauncey Reading

## 2011-10-03 ENCOUNTER — Inpatient Hospital Stay (HOSPITAL_COMMUNITY): Payer: 59

## 2011-10-03 LAB — RENAL FUNCTION PANEL
BUN: 62 mg/dL — ABNORMAL HIGH (ref 6–23)
CO2: 24 mEq/L (ref 19–32)
Glucose, Bld: 143 mg/dL — ABNORMAL HIGH (ref 70–99)
Phosphorus: 3.6 mg/dL (ref 2.3–4.6)
Potassium: 3.6 mEq/L (ref 3.5–5.1)

## 2011-10-03 LAB — GLUCOSE, CAPILLARY: Glucose-Capillary: 212 mg/dL — ABNORMAL HIGH (ref 70–99)

## 2011-10-03 LAB — CBC
HCT: 21 % — ABNORMAL LOW (ref 39.0–52.0)
Hemoglobin: 7.4 g/dL — ABNORMAL LOW (ref 13.0–17.0)
MCH: 33.5 pg (ref 26.0–34.0)
MCHC: 35.2 g/dL (ref 30.0–36.0)

## 2011-10-03 MED ORDER — PARICALCITOL 5 MCG/ML IV SOLN
INTRAVENOUS | Status: AC
  Administered 2011-10-03: 1 ug via INTRAVENOUS
  Filled 2011-10-03: qty 1

## 2011-10-03 MED FILL — Heparin Sodium (Porcine) Inj 1000 Unit/ML: INTRAMUSCULAR | Qty: 30 | Status: AC

## 2011-10-03 MED FILL — Heparin Sodium (Porcine) Inj 1000 Unit/ML: INTRAMUSCULAR | Qty: 10 | Status: AC

## 2011-10-03 MED FILL — Lidocaine HCl IV Inj 20 MG/ML: INTRAVENOUS | Qty: 5 | Status: AC

## 2011-10-03 MED FILL — Sodium Bicarbonate IV Soln 8.4%: INTRAVENOUS | Qty: 50 | Status: AC

## 2011-10-03 MED FILL — Electrolyte-R (PH 7.4) Solution: INTRAVENOUS | Qty: 5000 | Status: AC

## 2011-10-03 MED FILL — Sodium Chloride Irrigation Soln 0.9%: Qty: 3000 | Status: AC

## 2011-10-03 MED FILL — Sodium Chloride IV Soln 0.9%: INTRAVENOUS | Qty: 1000 | Status: AC

## 2011-10-03 NOTE — Procedures (Signed)
I was present at this dialysis session. I have reviewed the session itself and made appropriate changes.   Kelly Splinter, MD [employer] 10/03/2011, 12:59 PM

## 2011-10-03 NOTE — Progress Notes (Signed)
CARDIAC REHAB PHASE I   PRE:  Rate/Rhythm: 68SR  BP:  Supine:   Sitting: 97/57  Standing:    SaO2: 98%RA  MODE:  Ambulation: 550 ft   POST:  Rate/Rhythem: 83SR  BP:  Supine:   Sitting: 113/48  Standing:    SaO2: 96%RA 1415-1500 Waited for pt to eat before we walked. Walked 550 ft on RA with his rollator and asst x2. Pt denied weakness after walk. Tolerated well. To recliner with call bell after walk. Pt very motivated. Will keep as asst x 2 due to needing help to navigate.  Jeani Sow

## 2011-10-03 NOTE — Progress Notes (Signed)
Patient ambulated in hallway greater than 550 feet with family friend.  No oxygen and no SOB.  Pt returned to room and sat in chair.  Will continue to monitor. [name]

## 2011-10-03 NOTE — Progress Notes (Addendum)
Hemodialysis-See flowsheet. Hgb=7.4 this am, Renal MD notified, no new orders received.

## 2011-10-03 NOTE — Progress Notes (Signed)
Subjective:  No complaints, on dialysis  Objective:    Vital signs in last 24 hours: Filed Vitals:   10/03/11 1100 10/03/11 1130 10/03/11 1200 10/03/11 1230  BP: 119/68 104/59 111/64 113/66  Pulse: 60 57 58 60  Temp:      TempSrc:      Resp:      Height:      Weight:      SpO2:       Weight change: -0.2 kg (-7.1 oz)  Intake/Output Summary (Last 24 hours) at 10/03/11 1319 Last data filed at 10/03/11 0839  Gross per 24 hour  Intake    360 ml  Output      0 ml  Net    360 ml   Labs: Basic Metabolic Panel:  Lab [id number] 10/02/11 0508 10/01/11 0438 09/30/11 1609 09/30/11 1600 09/30/11 0415 09/29/11 1834 09/29/11 1255 09/29/11 0500 09/28/11 0818 09/28/11 0515  NA 135 137 134* 133* -- 135 134* 136 -- -- --  K 3.6 3.8 4.7 4.8 -- 4.6 4.7 4.2 -- -- --  CL 97 100 95* 96 -- 98 102 -- 95* -- --  CO2 24 28 24  -- -- 23 -- -- 29 27 28   GLUCOSE 143* 118* 93 157* -- 145* 128* 107* -- -- --  BUN 62* 41* 80* 83* -- 62* 57* -- 56* -- --  CREATININE 5.35* 4.01* 6.13* 5.60* 5.73* 5.03* 4.30* -- -- -- --  ALB -- -- -- -- -- -- -- -- -- -- --  CALCIUM 8.3* 8.5 8.7 -- -- 8.6 -- -- 9.4 9.6 9.7  PHOS 3.6 -- -- -- -- -- -- -- -- 6.7* 6.6*   Liver Function Tests:  Lab 10/03/11 0922 09/28/11 0818 09/28/11 0515  AST -- -- 16  ALT -- -- 35  ALKPHOS -- -- 83  BILITOT -- -- 0.4  PROT -- -- 6.2  ALBUMIN 2.2* 3.5 3.5   No results found for this basename: LIPASE:3,AMYLASE:3 in the last 168 hours No results found for this basename: AMMONIA:3 in the last 168 hours CBC:  Lab 10/03/11 0922 10/02/11 0508 10/01/11 0438 09/30/11 1609 09/30/11 1600  WBC 3.4* 5.0 4.8 -- 7.4  NEUTROABS -- -- -- -- --  HGB 7.4* 7.9* 6.8* 7.1* --  HCT 21.0* 22.1* 19.3* 21.0* --  MCV 95.0 93.6 96.5 -- 96.5  PLT 59* 50* 53* -- 67*   Cardiac Enzymes: No results found for this basename: CKTOTAL:5,CKMB:5,CKMBINDEX:5,TROPONINI:5 in the last 168 hours CBG:  Lab 10/03/11 0625 10/02/11 2134 10/02/11 1618 10/02/11 1132  10/02/11 0515  GLUCAP 96 125* 170* 230* 117*    Iron Studies: No results found for this basename: IRON:30,TIBC:30,SATURATION RATIOS:30,TRANSFERRIN:30,FERRITIN:30 in the last 168 hours  Physical Exam:  Blood pressure 113/66, pulse 60, temperature 98.6 F (37 C), temperature source Oral, resp. rate 15, height 6\' 4"  (1.93 m), weight 99.2 kg (218 lb 11.1 oz), SpO2 99.00%.  Gen:WD WN WM in NAD  CVS:RRR  Resp:CTA  [id number]  Ext: 1+ bilat LE edema, L forearm AVF +T/B  Dialysis Orders: Center: [facility] On MWF.  EDW 87.5 HD Bath 2K 2.25 Ca Time 4 Heparin 8000 Access left lower AVF BFR 400 DFR 800  Zemplar 1 mcg IV/HD Epogen 1800 was d/c 6/25 Units IV/HD Infed 50/Wed  Other profile 2  Assessment/Plan 1. CAD s/p CABG- doing well post op. Cont with progression 2. ESRD MWF HD- HD today. Up 11kg by weights, 6/30 cxr clear. Increase UF next HD on Wednesday  if still here.  3. Anemia: ABLA and CKD- getting aranesp 60/wk and weekly IV iron here, transfused 2units blood on 6/29 4. MBD- continue Zemplar 1ug and phoslo 3ac as at home 5. Vascular access- stable 6. DM- stable 7. Nutrition:advance diet 8. Hypertension:stable   Nicholas Splinter  MD [facility] [phone] pgr    [phone] cell 10/03/2011, 1:19 PM

## 2011-10-03 NOTE — Progress Notes (Signed)
4 Days Post-Op Procedure(s) (LRB): CORONARY ARTERY BYPASS GRAFTING (CABG) (N/A) Subjective: No complaints.  Had HD today.  Objective: Vital signs in last 24 hours: Temp:  [97.5 F (36.4 C)-99 F (37.2 C)] 97.5 F (36.4 C) (07/01 1456) Pulse Rate:  [57-69] 68  (07/01 1456) Cardiac Rhythm:  [-] Normal sinus rhythm;Sinus bradycardia (07/01 1311) Resp:  [15-20] 19  (07/01 1456) BP: (97-120)/(53-68) 105/60 mmHg (07/01 1456) SpO2:  [98 %-100 %] 99 % (07/01 1311) Weight:  [97.1 kg (214 lb 1.1 oz)-100 kg (220 lb 7.4 oz)] 97.1 kg (214 lb 1.1 oz) (07/01 1311)  Hemodynamic parameters for last 24 hours:    Intake/Output from previous day: 06/30 0701 - 07/01 0700 In: 480 [P.O.:480] Out: -  Intake/Output this shift: Total I/O In: 360 [P.O.:360] Out: 2000 [Other:2000]  General appearance: alert and cooperative Neurologic: intact Heart: regular rate and rhythm, S1, S2 normal, no murmur, click, rub or gallop Lungs: clear to auscultation bilaterally Extremities: edema mild Wound: incisions ok  Lab Results:  Basename 10/03/11 0922 10/02/11 0508  WBC 3.4* 5.0  HGB 7.4* 7.9*  HCT 21.0* 22.1*  PLT 59* 50*   BMET:  Basename 10/03/11 0922 10/02/11 0508  NA 135 137  K 3.6 3.8  CL 97 100  CO2 24 28  GLUCOSE 143* 118*  BUN 62* 41*  CREATININE 5.35* 4.01*  CALCIUM 8.3* 8.5    PT/INR: No results found for this basename: LABPROT,INR in the last 72 hours ABG    Component Value Date/Time   PHART 7.294* 09/29/2011 1830   HCO3 23.0 09/29/2011 1830   TCO2 22 09/30/2011 1609   ACIDBASEDEF 3.0* 09/29/2011 1830   O2SAT 100.0 09/29/2011 1830   CBG (last 3)   Basename 10/03/11 1633 10/03/11 0625 10/02/11 2134  GLUCAP 212* 96 125*    Assessment/Plan: S/P Procedure(s) (LRB): CORONARY ARTERY BYPASS GRAFTING (CABG) (N/A) HD per nephrology Continue ambulation, IS. Probably home wed after HD.  LOS: 6 days    [name] K 10/03/2011

## 2011-10-04 LAB — GLUCOSE, CAPILLARY
Glucose-Capillary: 112 mg/dL — ABNORMAL HIGH (ref 70–99)
Glucose-Capillary: 135 mg/dL — ABNORMAL HIGH (ref 70–99)

## 2011-10-04 MED ORDER — IRON DEXTRAN 50 MG/ML IJ SOLN: 50.0000 mg | INTRAMUSCULAR | Status: DC

## 2011-10-04 MED ORDER — METOPROLOL TARTRATE 12.5 MG HALF TABLET: 12.5000 mg | ORAL_TABLET | Freq: Two times a day (BID) | ORAL | Status: DC

## 2011-10-04 MED ORDER — DARBEPOETIN ALFA-POLYSORBATE 60 MCG/0.3ML IJ SOLN: 60.0000 ug | INTRAMUSCULAR | Status: DC

## 2011-10-04 MED ORDER — DARBEPOETIN ALFA-POLYSORBATE 150 MCG/0.3ML IJ SOLN: 150.0000 ug | INTRAMUSCULAR | Status: DC

## 2011-10-04 MED ORDER — TRAMADOL HCL 50 MG PO TABS: 50.0000 mg | ORAL_TABLET | Freq: Two times a day (BID) | ORAL | Status: AC | PRN

## 2011-10-04 NOTE — Progress Notes (Addendum)
5 Days Post-Op Procedure(s) (LRB): CORONARY ARTERY BYPASS GRAFTING (CABG) (N/A) Subjective: [name] has no new complaints this morning. Looks good, states he had a BM this morning  Objective: Vital signs in last 24 hours: Temp:  [97.5 F (36.4 C)-99 F (37.2 C)] 99 F (37.2 C) (07/02 0600) Pulse Rate:  [57-69] 64  (07/02 0600) Cardiac Rhythm:  [-] Heart block (07/02 0800) Resp:  [15-20] 18  (07/02 0600) BP: (87-120)/(53-68) 104/64 mmHg (07/02 0600) SpO2:  [97 %-99 %] 97 % (07/02 0600) Weight:  [214 lb 1.1 oz (97.1 kg)-216 lb 11.4 oz (98.3 kg)] 216 lb 11.4 oz (98.3 kg) (07/02 0600)  Intake/Output from previous day: 07/01 0701 - 07/02 0700 In: 600 [P.O.:600] Out: 2000  Intake/Output this shift: Total I/O In: 240 [P.O.:240] Out: -   General appearance: alert, cooperative and no distress Heart: regular rate and rhythm Lungs: clear to auscultation bilaterally Abdomen: soft, non-tender; bowel sounds normal; no masses,  no organomegaly Extremities: edema trace Wound: clean and dry  Lab Results:  [facility] 10/03/11 0922 10/02/11 0508  WBC 3.4* 5.0  HGB 7.4* 7.9*  HCT 21.0* 22.1*  PLT 59* 50*   BMET:  Basename 10/03/11 0922 10/02/11 0508  NA 135 137  K 3.6 3.8  CL 97 100  CO2 24 28  GLUCOSE 143* 118*  BUN 62* 41*  CREATININE 5.35* 4.01*  CALCIUM 8.3* 8.5    PT/INR: No results found for this basename: LABPROT,INR in the last 72 hours ABG    Component Value Date/Time   PHART 7.294* 09/29/2011 1830   HCO3 23.0 09/29/2011 1830   TCO2 22 09/30/2011 1609   ACIDBASEDEF 3.0* 09/29/2011 1830   O2SAT 100.0 09/29/2011 1830   CBG (last 3)   Basename 10/04/11 0639 10/03/11 2029 10/03/11 1633  GLUCAP 112* 181* 212*    Assessment/Plan: S/P Procedure(s) (LRB): CORONARY ARTERY BYPASS GRAFTING (CABG) (N/A)  1. CV- NSR rate and pressure controlled, continue Lopressor 2. Pulmonary- no acute issues, continue IS 3. ESRD- Nephrology following, HD performed yesterday 4.  Dispo- patient doing well.  If he feels up to it and no issues arise will aim for d/c tomorrow after HD   LOS: 7 days    [name] 10/04/2011    Chart reviewed, patient examined, agree with above.

## 2011-10-04 NOTE — Progress Notes (Signed)
CARDIAC REHAB PHASE I   PRE:  Rate/Rhythm: 60SR  BP:  Supine:   Sitting: 96/57  Standing:    SaO2: 96%RA  MODE:  Ambulation: 890 ft   POST:  Rate/Rhythem: 83SR  BP:  Supine:   Sitting: 140/57  Standing:    SaO2: 100%RA 1030-1105 Pt walked 890 ft with rollator and myself guiding it. He tolerated well. Declined need for rest stop. Back to recliner after walk. Family states wife has questions about Phase 2. Pt's RN to page  If wife comes today for ed. Pt very motivated.  Jeani Sow

## 2011-10-04 NOTE — Progress Notes (Signed)
Pt ambulated 1100 ft on rm with the assistance of  A wheelchair. Pt tolerated activity well. Will continue to monitor.   Bemnet Trovato M

## 2011-10-04 NOTE — Discharge Summary (Signed)
[address]            [place] [address]          [phone]         Discharge Summary  Name: [name] DOB: 15-Jan-1951 62 y.o. MRN: [id number]   Admission Date: 09/27/2011 Discharge Date:     Admitting Diagnosis:  Severe multivessel coronary disease   Discharge Diagnoses:  Severe multivessel coronary disease  Hypertension  Type 2 diabetes mellitus, diet controlled  Hyperlipidemia  End-stage renal disease on hemodialysis Monday Wednesday Friday at [facility]  History of renal cell carcinoma  Acute on chronic anemia  Peripheral vascular disease, status post right foot amputation  Bilateral blindness, status post enucleation  Postoperative thrombocytopenia    Procedures: Procedure(s): CORONARY ARTERY BYPASS GRAFTING x 2 (left internal mammary artery to the LAD, saphenous vein graft to the obtuse marginal ) on 09/29/2011   HPI:  The patient is a 61 y.o. male with a history of nonobstructive coronary artery disease. He also has a history of end-stage renal disease and currently dialyzes Monday, Wednesday, and Friday. He is in the process of being evaluated for a kidney transplant at [facility]. As part of his workup, he underwent a nuclear stress test which revealed an EF of 49% with coronary calcification in the LAD, right coronary artery and circumflex, with mild apical, apical lateral, and did anterolateral ischemia. He was referred to [facility] for cardiac catheterization. This was performed on the date of admission by Dr. Bing Quarry. The patient was found to have an 80% proximal LAD stenosis with a diffusely calcified proximal LAD. Left circumflex had 95% proximal stenosis in the right coronary had 40-50% nonobstructive stenosis. He was subsequently admitted following catheterization for further cardiac workup.  Hospital Course:  The patient was admitted to [facility] on 6/25/2013He was seen in  consultation by Dr. Gilford Raid for consideration of surgical revascularization. Dr. Cyndia Bent agreed with the need for coronary artery bypass grafting at this time. All risks, benefits and alternatives of surgery were explained in detail, and the patient agreed to proceed. The patient was taken to the operating room and underwent the above procedure.    The postoperative course has been uneventful. He did develop a postoperative anemia and required packed red blood cells in addition to Epogen and Aranesp with hemodialysis. He was also noted to have a thrombocytopenia, but this has been stable and is slowly improving without further treatment. He was continued on his regular hemodialysis schedule and has remained stable from a renal standpoint. From a cardiac standpoint, he is maintaining normal sinus rhythm and blood pressures have been controlled. He is ambulating the halls with cardiac rehabilitation phase 1 is progressing well. He is tolerating a regular diet. We anticipate discharge home within the next 24 hours provided no acute changes occur.   Recent vital signs:  Filed Vitals:   10/04/11 0600  BP: 104/64  Pulse: 64  Temp: 99 F (37.2 C)  Resp: 18    Recent laboratory studies:  CBC: Basename 10/03/11 0922 10/02/11 0508  WBC 3.4* 5.0  HGB 7.4* 7.9*  HCT 21.0* 22.1*  PLT 59* 50*   BMET:  Basename 10/03/11 0922 10/02/11 0508  NA 135 137  K 3.6 3.8  CL 97 100  CO2 24 28  GLUCOSE 143* 118*  BUN 62* 41*  CREATININE  5.35* 4.01*  CALCIUM 8.3* 8.5    PT/INR: No results found for this basename: LABPROT,INR in the last 72 hours   Discharge Medications:   Medication List  As of 10/04/2011  9:59 AM   STOP taking these medications         acetaminophen 325 MG tablet         TAKE these medications         aspirin EC 81 MG tablet   Take 81 mg by mouth daily.      atorvastatin 10 MG tablet   Commonly known as: LIPITOR   Take 10 mg by mouth daily.      b complex-vitamin  c-folic acid 0.8 MG Tabs   Take 0.8 mg by mouth at bedtime.      calcium acetate 667 MG capsule   Commonly known as: PHOSLO   Take 1,334 mg by mouth 3 (three) times daily with meals.      darbepoetin 60 MCG/0.3ML Soln   Commonly known as: ARANESP   Inject 0.3 mLs (60 mcg total) into the vein every Friday with hemodialysis.      docusate sodium 100 MG capsule   Commonly known as: COLACE   Take 100 mg by mouth daily as needed. constipation      finasteride 5 MG tablet   Commonly known as: PROSCAR   Take 5 mg by mouth at bedtime.      iron dextran complex 50 MG/ML injection   Commonly known as: INFED   Inject 1 mL (50 mg total) into the vein every Wednesday with hemodialysis.      metoprolol tartrate 12.5 mg Tabs   Commonly known as: LOPRESSOR   Take 0.5 tablets (12.5 mg total) by mouth 2 (two) times daily.      traMADol 50 MG tablet   Commonly known as: ULTRAM   Take 1-2 tablets (50-100 mg total) by mouth every 12 (twelve) hours as needed for pain.             Discharge Instructions:  The patient is to refrain from driving, heavy lifting or strenuous activity.  May shower daily and clean incisions with soap and water.  May resume regular diet.   Follow Up:  Discharge Orders    Future Appointments: Provider: Department: Dept Phone: Center:   10/25/2011 2:30 PM [name], MD [facility] [phone] [facility]      Follow-up Information    Follow up with [name], MD on 10/25/2011. (have a chest x-ray at 1:30, then see MD at 2:30)    Contact information:   [place] [phone]       Follow up with Jenell Milliner, MD. Schedule an appointment as soon as possible for a visit in 2 weeks.   Contact information:   [id number] [place] [phone]       Please follow up. (Resume your regular hemodialysis schedule)           Lubertha Leite  H 10/04/2011, 9:59 AM

## 2011-10-04 NOTE — Progress Notes (Signed)
Subjective:  Sitting up in chair, no cos.tolerated hd yesterday 2l uf Objective Vital signs in last 24 hours: Filed Vitals:   10/03/11 2210 10/04/11 0600 10/04/11 1055 10/04/11 1349  BP: 87/54 104/64 140/57 98/45  Pulse:  64 70 55  Temp:  99 F (37.2 C)  97.8 F (36.6 C)  TempSrc:  Oral  Oral  Resp:  18  18  Height:      Weight:  98.3 kg (216 lb 11.4 oz)    SpO2:  97%  100%   Weight change: -0.8 kg (-1 lb 12.2 oz)  Intake/Output Summary (Last 24 hours) at 10/04/11 1519 Last data filed at 10/04/11 1230  Gross per 24 hour  Intake    720 ml  Output      0 ml  Net    720 ml   Labs: Basic Metabolic Panel:  Lab [id number] 0922 10/02/11 0508 10/01/11 0438 09/28/11 0818 09/28/11 0515  NA 135 137 134* -- --  K 3.6 3.8 4.7 -- --  CL 97 100 95* -- --  CO2 24 28 24  -- --  GLUCOSE 143* 118* 93 -- --  BUN 62* 41* 80* -- --  CREATININE 5.35* 4.01* 6.13* -- --  CALCIUM 8.3* 8.5 8.7 -- --  ALB -- -- -- -- --  PHOS 3.6 -- -- 6.7* 6.6*   Liver Function Tests:  Lab 10/03/11 0922 09/28/11 0818 09/28/11 0515  AST -- -- 16  ALT -- -- 35  ALKPHOS -- -- 83  BILITOT -- -- 0.4  PROT -- -- 6.2  ALBUMIN 2.2* 3.5 3.5   No results found for this basename: LIPASE:3,AMYLASE:3 in the last 168 hours No results found for this basename: AMMONIA:3 in the last 168 hours CBC:  Lab 10/03/11 0922 10/02/11 0508 10/01/11 0438 09/30/11 1600 09/30/11 0415  WBC 3.4* 5.0 4.8 -- --  NEUTROABS -- -- -- -- --  HGB 7.4* 7.9* 6.8* -- --  HCT 21.0* 22.1* 19.3* -- --  MCV 95.0 93.6 96.5 96.5 96.6  PLT 59* 50* 53* -- --   Cardiac Enzymes: No results found for this basename: CKTOTAL:5,CKMB:5,CKMBINDEX:5,TROPONINI:5 in the last 168 hours CBG:  Lab 10/04/11 1135 10/04/11 0639 10/03/11 2029 10/03/11 1633 10/03/11 0625  GLUCAP 135* 112* 181* 212* 96    Iron Studies: No results found for this basename: IRON,TIBC,TRANSFERRIN,FERRITIN in the last 72 hours Studies/Results: No results found. Medications:        . acetaminophen  1,000 mg Oral Q6H   Or  . acetaminophen (TYLENOL) oral liquid 160 mg/5 mL  975 mg Per Tube Q6H  . aspirin EC  81 mg Oral Daily   Or  . aspirin  81 mg Per Tube Daily  . atorvastatin  10 mg Oral QHS  . bisacodyl  10 mg Oral Daily   Or  . bisacodyl  10 mg Rectal Daily  . calcium acetate  1,334 mg Oral TID WC  . darbepoetin (ARANESP) injection - DIALYSIS  60 mcg Intravenous Q Fri-HD  . docusate sodium  200 mg Oral Daily  . finasteride  5 mg Oral QHS  . insulin aspart  0-24 Units Subcutaneous TID AC & HS  . iron dextran complex  50 mg Intravenous Q Wed-HD  . metoprolol tartrate  12.5 mg Oral BID   Or  . metoprolol tartrate  12.5 mg Per Tube BID  . multivitamin  1 tablet Oral QHS  . pantoprazole  40 mg Oral Q1200  . paricalcitol  1 mcg  Intravenous 3 times weekly  . sodium chloride  3 mL Intravenous Q12H   I  have reviewed scheduled and prn medications.  Physical Exam: General: Alert NAD, Appropriate Heart: RRR Lungs: CTA  Abdomen: BS = +  Soft nontender Extremities: Dialysis Access: 1+ bipedal edema,positive bruit left fa avf   Assessment/Plan  1. CAD s/p CABG- doing well post op. Cont with progression/ noted probable dc in am 2. ESRD MWF HD- HD in am. Up 11kg by weights, 6/30 cxr clear. Increase UF next HD on Wednesday . edw at [facility] = 87.5   Wt today  98.3 with o2 sat 100 % rm air 3. Anemia: ABLA and CKD- getting aranesp 60/wk and weekly IV iron here, transfused 2units blood on 6/29   hgb 7.4  Today  Increase  Aranesp 4. MBD- continue Zemplar 1ug and phoslo 3ac as at home/ca and phos stable 5. Vascular access- stable 6. DM- stable 7. Nutrition:advance diet 8. Hypertension:stable   Ernest Haber, PA-C [place] [phone] 10/04/2011,3:19 PM  LOS: 7 days   Patient seen and examined and agree with assessment and plan as above. Maximum UF with dialysis tomorrow, try and get him back down towards his dry weight.  Kelly Splinter   MD [facility] [phone] pgr    [phone] cell 10/04/2011, 4:01 PM

## 2011-10-05 ENCOUNTER — Inpatient Hospital Stay (HOSPITAL_COMMUNITY): Payer: 59

## 2011-10-05 ENCOUNTER — Encounter: Payer: Self-pay | Admitting: Surgery

## 2011-10-05 LAB — CBC
HCT: 21.6 % — ABNORMAL LOW (ref 39.0–52.0)
MCV: 96.9 fL (ref 78.0–100.0)
Platelets: 95 10*3/uL — ABNORMAL LOW (ref 150–400)
RBC: 2.23 MIL/uL — ABNORMAL LOW (ref 4.22–5.81)
RDW: 14.9 % (ref 11.5–15.5)
WBC: 3.3 10*3/uL — ABNORMAL LOW (ref 4.0–10.5)

## 2011-10-05 LAB — BASIC METABOLIC PANEL
CO2: 25 mEq/L (ref 19–32)
Chloride: 99 mEq/L (ref 96–112)
GFR calc Af Amer: 11 mL/min — ABNORMAL LOW (ref >90)
Potassium: 4.4 mEq/L (ref 3.5–5.1)

## 2011-10-05 LAB — GLUCOSE, CAPILLARY

## 2011-10-05 MED ORDER — SODIUM CHLORIDE 0.9 % IV SOLN: 50.0000 mg | INTRAVENOUS | Status: DC

## 2011-10-05 MED ORDER — PARICALCITOL 5 MCG/ML IV SOLN
INTRAVENOUS | Status: AC
  Administered 2011-10-05: 1 ug via INTRAVENOUS
  Filled 2011-10-05: qty 1

## 2011-10-05 MED ORDER — SODIUM CHLORIDE 0.9 % IV SOLN
50.0000 mg | INTRAVENOUS | Status: DC
  Administered 2011-10-05: 50 mg via INTRAVENOUS
  Filled 2011-10-05: qty 1

## 2011-10-05 MED ORDER — DARBEPOETIN ALFA-POLYSORBATE 150 MCG/0.3ML IJ SOLN: 150.0000 ug | INTRAMUSCULAR | Status: DC

## 2011-10-05 NOTE — Procedures (Signed)
I was present at this dialysis session. I have reviewed the session itself and made appropriate changes.   Kelly Splinter, MD [employer] 10/05/2011, 9:48 AM

## 2011-10-05 NOTE — Progress Notes (Signed)
D/C EPW and CTS per protocol and as ordered, pt tolerated well, no ectopy/bleeding noted. Pt reminded to lie supine for one hour.  Steris applied to chest tube sites. VS WNL, INR 1.26.  Will continue to monitor.  Cephus Richer RN

## 2011-10-05 NOTE — Progress Notes (Signed)
Subjective:  On dialysis, no sob or CP.   Objective Vital signs in last 24 hours: Filed Vitals:   10/05/11 0800 10/05/11 0830 10/05/11 0900 10/05/11 0930  BP: 128/63 111/57 120/62 126/62  Pulse: 64 81 57 57  Temp:      TempSrc:      Resp: 18 20 21 20   Height:      Weight:      SpO2:       Weight change: -0.2 kg (-7.1 oz)  Intake/Output Summary (Last 24 hours) at 10/05/11 0941 Last data filed at 10/04/11 1230  Gross per 24 hour  Intake    240 ml  Output      0 ml  Net    240 ml   Labs: Basic Metabolic Panel:  Lab [id number] 10/03/11 0922 10/02/11 0508  NA 136 135 137  K 4.4 3.6 3.8  CL 99 97 100  CO2 25 24 28   GLUCOSE 126* 143* 118*  BUN 55* 62* 41*  CREATININE 5.90* 5.35* 4.01*  CALCIUM 8.6 8.3* 8.5  ALB -- -- --  PHOS -- 3.6 --   Liver Function Tests:  Lab 10/03/11 0922  AST --  ALT --  ALKPHOS --  BILITOT --  PROT --  ALBUMIN 2.2*   No results found for this basename: LIPASE:3,AMYLASE:3 in the last 168 hours No results found for this basename: AMMONIA:3 in the last 168 hours CBC:  Lab 10/05/11 0717 10/03/11 0922 10/02/11 0508 10/01/11 0438 09/30/11 1600  WBC 3.3* 3.4* 5.0 -- --  NEUTROABS -- -- -- -- --  HGB 7.3* 7.4* 7.9* -- --  HCT 21.6* 21.0* 22.1* -- --  MCV 96.9 95.0 93.6 96.5 96.5  PLT 95* 59* 50* -- --   Cardiac Enzymes: No results found for this basename: CKTOTAL:5,CKMB:5,CKMBINDEX:5,TROPONINI:5 in the last 168 hours CBG:  Lab 10/04/11 2049 10/04/11 1633 10/04/11 1135 10/04/11 0639 10/03/11 2029  GLUCAP 144* 166* 135* 112* 181*   Physical Exam: General: Alert NAD, Appropriate Heart: RRR Lungs: CTA  Abdomen: BS = +  Soft nontender Extremities: Dialysis Access: No edema,positive bruit left fa avf   Assessment/Plan  1. CAD s/p CABG- doing well.  Possible d/c after HD today per primary 2. ESRD MWF HD- HD in am. Over dry weight, asymptomatic. Max UF with HD today. 3. Anemia: ABLA and CKD- getting aranesp 60/wk and weekly IV iron  here, transfused 2units blood on 6/29   hgb 7.4, ESA increased yest 4. MBD- continue Zemplar 1ug and phoslo 3ac as at home/ca and phos stable 5. Vascular access- stable 6. DM- stable 7. Nutrition:advance diet 8. Hypertension:stable   Nicholas Splinter  MD [facility] [phone] pgr    [phone] cell 10/05/2011, 9:48 AM

## 2011-10-05 NOTE — Progress Notes (Signed)
[id number] Education completed with pt and wife. Permission given to refer to [facility] Phase 2. Dorisann Schwanke DunlapRN

## 2011-10-05 NOTE — Progress Notes (Addendum)
6 Days Post-Op Procedure(s) (LRB): CORONARY ARTERY BYPASS GRAFTING (CABG) (N/A)  Subjective: [name] is without complaints this morning.  He completed HD and feels well enough to go home.  Objective: Vital signs in last 24 hours: Temp:  [96.8 F (36 C)-98.4 F (36.9 C)] 96.8 F (36 C) (07/03 1117) Pulse Rate:  [55-81] 66  (07/03 1235) Cardiac Rhythm:  [-] Heart block (07/02 1930) Resp:  [14-28] 19  (07/03 1146) BP: (98-128)/(45-74) 120/74 mmHg (07/03 1235) SpO2:  [95 %-100 %] 97 % (07/03 1146) Weight:  [206 lb 14.4 oz (93.85 kg)-218 lb 4.1 oz (99 kg)] 206 lb 14.4 oz (93.85 kg) (07/03 1146)  Intake/Output from previous day: 07/02 0701 - 07/03 0700 In: 480 [P.O.:480] Out: -  Intake/Output this shift: Total I/O In: 3 [I.V.:3] Out: 4516 [Other:4516]  General appearance: alert, cooperative and no distress Neurologic: intact Heart: regular rate and rhythm Lungs: clear to auscultation bilaterally Abdomen: soft, non-tender; bowel sounds normal; no masses,  no organomegaly Extremities: edema trace Wound: clean and dry  Lab Results:  [facility] 10/05/11 0717 10/03/11 0922  WBC 3.3* 3.4*  HGB 7.3* 7.4*  HCT 21.6* 21.0*  PLT 95* 59*   BMET:  Basename 10/05/11 0717 10/03/11 0922  NA 136 135  K 4.4 3.6  CL 99 97  CO2 25 24  GLUCOSE 126* 143*  BUN 55* 62*  CREATININE 5.90* 5.35*  CALCIUM 8.6 8.3*    PT/INR: No results found for this basename: LABPROT,INR in the last 72 hours ABG    Component Value Date/Time   PHART 7.294* 09/29/2011 1830   HCO3 23.0 09/29/2011 1830   TCO2 22 09/30/2011 1609   ACIDBASEDEF 3.0* 09/29/2011 1830   O2SAT 100.0 09/29/2011 1830   CBG (last 3)   Basename 10/05/11 1234 10/04/11 2049 10/04/11 1633  GLUCAP 100* 144* 166*    Assessment/Plan: S/P Procedure(s) (LRB): CORONARY ARTERY BYPASS GRAFTING (CABG) (N/A)  1. CV- NSR rate and pressure controlled, continue Lopressor 2. Pulmonary- no acute issues continue IS at discharge 3. ESRD- HD  today 4. Dispo- patient doing well.  He tolerated HD without difficulty  He will be discharged home today  LOS: 8 days    BARRETT, ERIN 10/05/2011    Chart reviewed, patient examined, agree with above.

## 2011-10-18 ENCOUNTER — Ambulatory Visit (INDEPENDENT_AMBULATORY_CARE_PROVIDER_SITE_OTHER): Payer: 59 | Admitting: Cardiology

## 2011-10-18 ENCOUNTER — Encounter: Payer: Self-pay | Admitting: Cardiology

## 2011-10-18 VITALS — BP 94/62 | HR 59 | Ht 76.0 in | Wt 201.0 lb

## 2011-10-18 DIAGNOSIS — N289 Disorder of kidney and ureter, unspecified: Secondary | ICD-10-CM

## 2011-10-18 DIAGNOSIS — I2510 Atherosclerotic heart disease of native coronary artery without angina pectoris: Secondary | ICD-10-CM

## 2011-10-18 DIAGNOSIS — R609 Edema, unspecified: Secondary | ICD-10-CM

## 2011-10-18 DIAGNOSIS — I739 Peripheral vascular disease, unspecified: Secondary | ICD-10-CM

## 2011-10-18 DIAGNOSIS — I10 Essential (primary) hypertension: Secondary | ICD-10-CM

## 2011-10-18 NOTE — Patient Instructions (Addendum)
Referred to cardiac rehab.  Your physician recommends that you schedule a follow-up appointment in: 3 months with Dr. Verl Blalock.

## 2011-10-18 NOTE — Assessment & Plan Note (Signed)
Doing well status post coronary bypass grafting. Will recommend he go to cardiac rehabilitation. I'll see him back for followup in 3 months. I'll continue low-dose beta blocker. I've asked him and his wife not to proceed with any biopsies or kidney transplant him to at least 3 months post bypass.

## 2011-10-18 NOTE — Progress Notes (Signed)
HPI [name] comes in today for close followup after having coronary bypass grafting about 3-4 weeks ago.  Is getting along remarkably well. Sternal incision does not hurt. His wife would like him to get in cardiac rehabilitation.  He is on very low-dose Lopressor. His blood pressures been running in the 90s. He is asymptomatic with that.  Past Medical History  Diagnosis Date  . CAD, NATIVE VESSEL 01/08/2008  . HYPERLIPIDEMIA-MIXED 01/08/2008  . OVERWEIGHT/OBESITY 01/08/2008  . Edema 01/08/2008  . HYPERTENSION 01/27/2009  . Anemia   . Blood transfusion ~ 2003  . Bruises easily   . Blind     both eyes removed   . Family history of breast cancer     mother  . Cellulitis late 1980's    "hospitalized; wrapped both legs; several times; no OR for this"  . Peripheral vascular disease   . Renal failure   . ESRD (end stage renal disease) on dialysis     09/27/11 [facility]; Mon, Wed, Fri  . Pneumonia 2000;s  . DM type 2 (diabetes mellitus, type 2)   . Renal cell carcinoma     "both kidneys"    Current Outpatient Prescriptions  Medication Sig Dispense Refill  . aspirin EC 81 MG tablet Take 81 mg by mouth daily.      [name] atorvastatin (LIPITOR) 10 MG tablet Take 10 mg by mouth daily.      [name] b complex-vitamin c-folic acid (NEPHRO-VITE) 0.8 MG TABS Take 0.8 mg by mouth at bedtime.      . calcium acetate (PHOSLO) 667 MG capsule Take 1,334 mg by mouth 3 (three) times daily with meals.       . darbepoetin (ARANESP) 150 MCG/0.3ML SOLN Inject 0.3 mLs (150 mcg total) into the vein every Friday with hemodialysis.  1.68 mL    . docusate sodium (COLACE) 100 MG capsule Take 100 mg by mouth daily as needed. constipation      . finasteride (PROSCAR) 5 MG tablet Take 5 mg by mouth at bedtime.       . iron dextran complex (INFED) 50 MG/ML injection Inject 1 mL (50 mg total) into the vein every Wednesday with hemodialysis.  2 mL    . metoprolol tartrate (LOPRESSOR) 12.5 mg TABS Take 0.5 tablets (12.5 mg  total) by mouth 2 (two) times daily.  30 tablet  1  . sodium chloride 0.9 % SOLN 100 mL with iron dextran complex 50 MG/ML SOLN 50 mg Inject 50 mg into the vein every 7 (seven) days.        Allergies  Allergen Reactions  . Codeine Other (See Comments)    Makes patient incoherent. STATES MAKES HIM COMATOSE  . Tape Other (See Comments)    Plastic tape tears skin off, please use paper tape instead.    Family History  Problem Relation Age of Onset  . Cancer    . Coronary artery disease    . Kidney disease    . Breast cancer    . Ovarian cancer    . Lung cancer    . Diabetes    . Cancer Mother 64    breast, spine and ovarian  . Cancer Father 32    kidney, prostate  . Emphysema Father   . Cancer Sister     ovarian  . Cancer Brother     lung  . Malignant hyperthermia Neg Hx     History   Social History  . Marital Status: Married  Spouse Name: N/A    Number of Children: N/A  . Years of Education: N/A   Occupational History  . Not on file.   Social History Main Topics  . Smoking status: Never Smoker   . Smokeless tobacco: Never Used  . Alcohol Use: No  . Drug Use: No  . Sexually Active: Yes   Other Topics Concern  . Not on file   Social History Narrative  . No narrative on file    ROS ALL NEGATIVE EXCEPT THOSE NOTED IN HPI  PE  General Appearance: well developed, well nourished in no acute distress HEENT: symmetrical face, PERRLA, good dentition  Neck: no JVD, thyromegaly, or adenopathy, trachea midline Chest: symmetric without deformity, sternal incision looks good. No instability. Cardiac: PMI non-displaced, RRR, normal S1, S2, no gallop or murmur Lung: clear to ausculation and percussion Vascular: all pulses full without bruits  Abdominal: nondistended, nontender, good bowel sounds, no HSM, no bruits Extremities: no cyanosis, clubbing , minimal edema, no sign of DVT, no varicosities  Skin: normal color, no rashes Neuro: alert and oriented x 3,  non-focal Pysch: normal affect  EKG  BMET    Component Value Date/Time   NA 136 10/05/2011 0717   K 4.4 10/05/2011 0717   CL 99 10/05/2011 0717   CO2 25 10/05/2011 0717   GLUCOSE 126* 10/05/2011 0717   BUN 55* 10/05/2011 0717   CREATININE 5.90* 10/05/2011 0717   CALCIUM 8.6 10/05/2011 0717   GFRNONAA 9* 10/05/2011 0717   GFRAA 11* 10/05/2011 0717    Lipid Panel     Component Value Date/Time   CHOL 120 09/28/2011 0515   TRIG 75 09/28/2011 0515   HDL 73 09/28/2011 0515   CHOLHDL 1.6 09/28/2011 0515   VLDL 15 09/28/2011 0515   LDLCALC 32 09/28/2011 0515    CBC    Component Value Date/Time   WBC 3.3* 10/05/2011 0717   RBC 2.23* 10/05/2011 0717   HGB 7.3* 10/05/2011 0717   HCT 21.6* 10/05/2011 0717   PLT 95* 10/05/2011 0717   MCV 96.9 10/05/2011 0717   MCH 32.7 10/05/2011 0717   MCHC 33.8 10/05/2011 0717   RDW 14.9 10/05/2011 0717   LYMPHSABS 0.6* 09/21/2011 1620   MONOABS 0.7 09/21/2011 1620   EOSABS 0.1 09/21/2011 1620   BASOSABS 0.0 09/21/2011 1620

## 2011-10-21 ENCOUNTER — Other Ambulatory Visit: Payer: Self-pay | Admitting: Surgery

## 2011-10-21 DIAGNOSIS — I2510 Atherosclerotic heart disease of native coronary artery without angina pectoris: Secondary | ICD-10-CM

## 2011-10-25 ENCOUNTER — Encounter: Payer: Self-pay | Admitting: Surgery

## 2011-10-25 ENCOUNTER — Ambulatory Visit
Admission: RE | Admit: 2011-10-25 | Discharge: 2011-10-25 | Disposition: A | Discharge status: Home / Self Care | Payer: 59 | Source: Ambulatory Visit | Attending: Surgery | Admitting: Surgery

## 2011-10-25 ENCOUNTER — Ambulatory Visit (INDEPENDENT_AMBULATORY_CARE_PROVIDER_SITE_OTHER): Payer: Self-pay | Admitting: Surgery

## 2011-10-25 VITALS — BP 98/62 | HR 74 | Resp 18 | Ht 76.0 in | Wt 196.0 lb

## 2011-10-25 DIAGNOSIS — I2510 Atherosclerotic heart disease of native coronary artery without angina pectoris: Secondary | ICD-10-CM

## 2011-10-25 DIAGNOSIS — Z951 Presence of aortocoronary bypass graft: Secondary | ICD-10-CM

## 2011-10-25 NOTE — Progress Notes (Signed)
[address]            [place] [address]          [phone]     HPI:  Patient returns for routine postoperative follow-up having undergone coronary bypass graft surgery x2 on 09/29/2011. The patient's early postoperative recovery while in the hospital was notable for an uncomplicated postoperative course. Since hospital discharge the patient reports he has been feeling well. He is already walking a couple miles a day. He is anxious to return to swimming and would like to start cardiac rehabilitation.   Current Outpatient Prescriptions  Medication Sig Dispense Refill  . aspirin EC 81 MG tablet Take 81 mg by mouth daily.      [name] atorvastatin (LIPITOR) 10 MG tablet Take 10 mg by mouth daily.      [name] b complex-vitamin c-folic acid (NEPHRO-VITE) 0.8 MG TABS Take 0.8 mg by mouth at bedtime.      . calcium acetate (PHOSLO) 667 MG capsule Take 1,334 mg by mouth 3 (three) times daily with meals.       . darbepoetin (ARANESP) 150 MCG/0.3ML SOLN Inject 0.3 mLs (150 mcg total) into the vein every Friday with hemodialysis.  1.68 mL    . docusate sodium (COLACE) 100 MG capsule Take 100 mg by mouth daily as needed. constipation      . finasteride (PROSCAR) 5 MG tablet Take 5 mg by mouth at bedtime.       . iron dextran complex (INFED) 50 MG/ML injection Inject 1 mL (50 mg total) into the vein every Wednesday with hemodialysis.  2 mL    . metoprolol tartrate (LOPRESSOR) 12.5 mg TABS Take 0.5 tablets (12.5 mg total) by mouth 2 (two) times daily.  30 tablet  1  . sodium chloride 0.9 % SOLN 100 mL with iron dextran complex 50 MG/ML SOLN 50 mg Inject 50 mg into the vein every 7 (seven) days.        Physical Exam: BP 98/62  Pulse 74  Resp 18  Ht 6\' 4"  (1.93 m)  Wt 196 lb (88.905 kg)  BMI 23.86 kg/m2  SpO2 99% He looks well. Cardiac exam shows a regular rate and rhythm with normal heart sounds. Lung exam is clear. Chest incision is healing well and sternum is  stable. Right leg incisions are healing well and there is no peripheral edema.  Diagnostic Tests:  *RADIOLOGY REPORT*   Clinical Data: Coronary bypass, outpatient follow-up   CHEST - 2 VIEW   Comparison: 09/28/2011   Findings: Median sternotomy wires appear intact.  Normal heart size and vascularity.  Negative for CHF, pneumonia, effusion or pneumothorax.  Skin fold overlies the left lower lobe.  Mild hyperinflation noted, stable.  Trachea midline.  Degenerative changes of the spine.   IMPRESSION: Previous coronary bypass.  No acute chest process   Original Report Authenticated By: Jerilynn Mages. Daryll Brod, M.D.   Impression:  He is making a good recovery following coronary bypass surgery. He was in very good physical condition preoperatively and I think he could start cardiac rehabilitation now. I asked him not to lift anything heavier than 10 pounds for a total of 3 months from the date of surgery. I told him he could return to swimming.  Plan:  He will continue followup with Dr. Verl Blalock and will contact me if he develops any problems with his incisions.

## 2011-11-03 ENCOUNTER — Encounter (HOSPITAL_COMMUNITY): Payer: Self-pay

## 2011-11-03 ENCOUNTER — Encounter (HOSPITAL_COMMUNITY)
Admission: RE | Admit: 2011-11-03 | Discharge: 2011-11-03 | Disposition: A | Discharge status: Home / Self Care | Payer: 59 | Source: Ambulatory Visit | Attending: Cardiology | Admitting: Cardiology

## 2011-11-03 DIAGNOSIS — E785 Hyperlipidemia, unspecified: Secondary | ICD-10-CM | POA: Insufficient documentation

## 2011-11-03 DIAGNOSIS — I2510 Atherosclerotic heart disease of native coronary artery without angina pectoris: Secondary | ICD-10-CM | POA: Insufficient documentation

## 2011-11-03 DIAGNOSIS — E118 Type 2 diabetes mellitus with unspecified complications: Secondary | ICD-10-CM | POA: Insufficient documentation

## 2011-11-03 DIAGNOSIS — Z5189 Encounter for other specified aftercare: Secondary | ICD-10-CM | POA: Insufficient documentation

## 2011-11-03 NOTE — Progress Notes (Signed)
Cardiac Rehab Medication Review by a Pharmacist  Does the patient  feel that his/her medications are working for him/her?  yes  Has the patient been experiencing any side effects to the medications prescribed?  Yes-  Blood pressure often drops low and causes dizziness.  Does the patient measure his/her own blood pressure or blood glucose at home?  yes   Does the patient have any problems obtaining medications due to transportation or finances?   no  Understanding of regimen: excellent Understanding of indications: good Potential of compliance: excellent    Pharmacist comments: Patient has concerns of low blood often running in [id number] systolic per MD. Pt has reduced his metoprolol from twice daily to once daily due to dizziness and hypotension. Nurse has been notified    Bola A. [place], [place] Pharmacist Pager:[phone] Phone [phone] 11/03/2011 8:44 AM

## 2011-11-07 ENCOUNTER — Encounter (HOSPITAL_COMMUNITY)
Admission: RE | Admit: 2011-11-07 | Discharge: 2011-11-07 | Disposition: A | Discharge status: Home / Self Care | Payer: 59 | Source: Ambulatory Visit | Attending: Cardiology | Admitting: Cardiology

## 2011-11-07 ENCOUNTER — Encounter (HOSPITAL_COMMUNITY): Payer: Self-pay

## 2011-11-07 LAB — GLUCOSE, CAPILLARY
Glucose-Capillary: 114 mg/dL — ABNORMAL HIGH (ref 70–99)
Glucose-Capillary: 119 mg/dL — ABNORMAL HIGH (ref 70–99)

## 2011-11-07 NOTE — Progress Notes (Signed)
Pt started cardiac rehab today.  Pt tolerated light exercise without difficulty.  Aysmptomatic, vss, telemetry-NSR.  Pt unable to review home medication list.  Will review home meds with pt wife.  Pt oriented to exercise equipment and routine.  Understanding verbalized.

## 2011-11-08 ENCOUNTER — Other Ambulatory Visit: Payer: Self-pay | Admitting: Cardiology

## 2011-11-09 ENCOUNTER — Encounter (HOSPITAL_COMMUNITY)
Admission: RE | Admit: 2011-11-09 | Discharge: 2011-11-09 | Disposition: A | Discharge status: Home / Self Care | Payer: 59 | Source: Ambulatory Visit | Attending: Cardiology | Admitting: Cardiology

## 2011-11-11 ENCOUNTER — Encounter (HOSPITAL_COMMUNITY)
Admission: RE | Admit: 2011-11-11 | Discharge: 2011-11-11 | Disposition: A | Discharge status: Home / Self Care | Payer: 59 | Source: Ambulatory Visit | Attending: Cardiology | Admitting: Cardiology

## 2011-11-14 ENCOUNTER — Encounter (HOSPITAL_COMMUNITY)
Admission: RE | Admit: 2011-11-14 | Discharge: 2011-11-14 | Disposition: A | Discharge status: Home / Self Care | Payer: 59 | Source: Ambulatory Visit | Attending: Cardiology | Admitting: Cardiology

## 2011-11-14 LAB — GLUCOSE, CAPILLARY: Glucose-Capillary: 146 mg/dL — ABNORMAL HIGH (ref 70–99)

## 2011-11-16 ENCOUNTER — Encounter (HOSPITAL_COMMUNITY)
Admission: RE | Admit: 2011-11-16 | Discharge: 2011-11-16 | Disposition: A | Discharge status: Home / Self Care | Payer: 59 | Source: Ambulatory Visit | Attending: Cardiology | Admitting: Cardiology

## 2011-11-16 LAB — GLUCOSE, CAPILLARY: Glucose-Capillary: 140 mg/dL — ABNORMAL HIGH (ref 70–99)

## 2011-11-18 ENCOUNTER — Encounter (HOSPITAL_COMMUNITY)
Admission: RE | Admit: 2011-11-18 | Discharge: 2011-11-18 | Disposition: A | Discharge status: Home / Self Care | Payer: 59 | Source: Ambulatory Visit | Attending: Cardiology | Admitting: Cardiology

## 2011-11-21 ENCOUNTER — Encounter (HOSPITAL_COMMUNITY)
Admission: RE | Admit: 2011-11-21 | Discharge: 2011-11-21 | Disposition: A | Discharge status: Home / Self Care | Payer: 59 | Source: Ambulatory Visit | Attending: Cardiology | Admitting: Cardiology

## 2011-11-23 ENCOUNTER — Encounter (HOSPITAL_COMMUNITY)
Admission: RE | Admit: 2011-11-23 | Discharge: 2011-11-23 | Disposition: A | Discharge status: Home / Self Care | Payer: 59 | Source: Ambulatory Visit | Attending: Cardiology | Admitting: Cardiology

## 2011-11-25 ENCOUNTER — Encounter (HOSPITAL_COMMUNITY)
Admission: RE | Admit: 2011-11-25 | Discharge: 2011-11-25 | Disposition: A | Discharge status: Home / Self Care | Payer: 59 | Source: Ambulatory Visit | Attending: Cardiology | Admitting: Cardiology

## 2011-11-28 ENCOUNTER — Encounter (HOSPITAL_COMMUNITY)
Admission: RE | Admit: 2011-11-28 | Discharge: 2011-11-28 | Disposition: A | Discharge status: Home / Self Care | Payer: 59 | Source: Ambulatory Visit | Attending: Cardiology | Admitting: Cardiology

## 2011-11-30 ENCOUNTER — Encounter (HOSPITAL_COMMUNITY)
Admission: RE | Admit: 2011-11-30 | Discharge: 2011-11-30 | Disposition: A | Discharge status: Home / Self Care | Payer: 59 | Source: Ambulatory Visit | Attending: Cardiology | Admitting: Cardiology

## 2011-12-02 ENCOUNTER — Encounter (HOSPITAL_COMMUNITY)
Admission: RE | Admit: 2011-12-02 | Discharge: 2011-12-02 | Disposition: A | Discharge status: Home / Self Care | Payer: 59 | Source: Ambulatory Visit | Attending: Cardiology | Admitting: Cardiology

## 2011-12-02 NOTE — Progress Notes (Signed)
[name] 61 y.o. male Nutrition Note Spoke with pt.  Nutrition Plan and cholesterol goals reviewed with pt. Pt states he has been following a Renal, diabetic diet for 5 years. Pt has been working with the Renal RD at dialysis for the past 3 years. Per pt, Renal RD aware of pt's heart disease dx. Pt's last A1c indicates blood glucose well-controlled. Pt reports he infrequently checks his CBG's at home "because I was told I don't have to check them all the time." Pt checks his CBG when he "feels funny." Per discussion, pt CBG's regularly checked at [facility]. Will follow-up with pt to discuss nutrition survey when pt returns [employer].  Nutrition Diagnosis   Food-and nutrition-related knowledge deficit related to lack of exposure to information as related to diagnosis of: ? CVD ? DM (A1c 6.0) Nutrition RX/ Estimated Daily Nutrition Needs for: wt maintenance 2750-2900 Kcal, 90-95 gm fat, 18-19 gm sat fat, 2.7-2.9 gm trans-fat, <1500 mg sodium , 425 gm CHO   Nutrition Intervention   Pt's individual nutrition plan including cholesterol goals reviewed with pt.   Pt to attend the Portion Distortion class   Pt to attend the  ? Nutrition I class                          ? Nutrition II class         ? Diabetes Blitz class        ? Diabetes Q & A class   Continue client-centered nutrition education by RD, as part of interdisciplinary care. Goal(s)   Pt to identify and limit food sources of saturated fat, trans fat, and cholesterol Monitor and Evaluate progress toward nutrition goal with team.  Nutrition Risk: change to Moderate

## 2011-12-05 ENCOUNTER — Encounter (HOSPITAL_COMMUNITY): Payer: 59

## 2011-12-05 DIAGNOSIS — E785 Hyperlipidemia, unspecified: Secondary | ICD-10-CM | POA: Insufficient documentation

## 2011-12-05 DIAGNOSIS — Z5189 Encounter for other specified aftercare: Secondary | ICD-10-CM | POA: Insufficient documentation

## 2011-12-05 DIAGNOSIS — E118 Type 2 diabetes mellitus with unspecified complications: Secondary | ICD-10-CM | POA: Insufficient documentation

## 2011-12-05 DIAGNOSIS — I2510 Atherosclerotic heart disease of native coronary artery without angina pectoris: Secondary | ICD-10-CM | POA: Insufficient documentation

## 2011-12-07 ENCOUNTER — Encounter (HOSPITAL_COMMUNITY)
Admission: RE | Admit: 2011-12-07 | Discharge: 2011-12-07 | Disposition: A | Discharge status: Home / Self Care | Payer: 59 | Source: Ambulatory Visit | Attending: Cardiology | Admitting: Cardiology

## 2011-12-09 ENCOUNTER — Encounter (HOSPITAL_COMMUNITY)
Admission: RE | Admit: 2011-12-09 | Discharge: 2011-12-09 | Disposition: A | Discharge status: Home / Self Care | Payer: 59 | Source: Ambulatory Visit | Attending: Cardiology | Admitting: Cardiology

## 2011-12-12 ENCOUNTER — Other Ambulatory Visit: Payer: Self-pay | Admitting: Interventional Radiology

## 2011-12-12 ENCOUNTER — Encounter (HOSPITAL_COMMUNITY)
Admission: RE | Admit: 2011-12-12 | Discharge: 2011-12-12 | Disposition: A | Discharge status: Home / Self Care | Payer: 59 | Source: Ambulatory Visit | Attending: Cardiology | Admitting: Cardiology

## 2011-12-12 ENCOUNTER — Telehealth: Payer: Self-pay | Admitting: Cardiology

## 2011-12-12 DIAGNOSIS — C649 Malignant neoplasm of unspecified kidney, except renal pelvis: Secondary | ICD-10-CM

## 2011-12-12 DIAGNOSIS — Z992 Dependence on renal dialysis: Secondary | ICD-10-CM

## 2011-12-12 NOTE — Telephone Encounter (Signed)
On no meds except his beta blocker. He should hold this prior to dialysis.

## 2011-12-12 NOTE — Telephone Encounter (Signed)
New problem:  Wife calling . C/o  B/p at HD - 9/4 70/? On Friday 70/? . Has some question .

## 2011-12-12 NOTE — Telephone Encounter (Signed)
Per dr Winnifred Friar note i called [facility] and advised to hold metoprolol before dialysis--per ms searce, they are already doing that per dialysis center instructions

## 2011-12-12 NOTE — Telephone Encounter (Signed)
Pt's wife calling concerned about pt's low BP--pt is dialysis pt and had just dialyzed's , and BP  running 70/?--they would not release him from dialysis until BP came up, which it did to 140/70--advised wife that low BP after dialysis is frequently low--as long as not experiencing any other symptoms, such as CP or SOB ----advised we would be happy to have pt come in for BP check or go to nearest urgent care but wife does not think it's necessary at this time--advised to call if other concerns

## 2011-12-12 NOTE — Telephone Encounter (Signed)
Hold his beta blocker before dialysis.

## 2011-12-14 ENCOUNTER — Encounter (HOSPITAL_COMMUNITY)
Admission: RE | Admit: 2011-12-14 | Discharge: 2011-12-14 | Disposition: A | Discharge status: Home / Self Care | Payer: 59 | Source: Ambulatory Visit | Attending: Cardiology | Admitting: Cardiology

## 2011-12-16 ENCOUNTER — Encounter (HOSPITAL_COMMUNITY)
Admission: RE | Admit: 2011-12-16 | Discharge: 2011-12-16 | Disposition: A | Discharge status: Home / Self Care | Payer: 59 | Source: Ambulatory Visit | Attending: Cardiology | Admitting: Cardiology

## 2011-12-16 NOTE — Progress Notes (Signed)
Reviewed home exercise guidelines with pt and pt's wife including. Pt has been walking in place for an hour and doing stretches for his exercise at home. Pt was doing his exercise routine consistently prior to his cardiac events and is progressing well with exercise. Pt voices understanding of instructions given.

## 2011-12-19 ENCOUNTER — Encounter (HOSPITAL_COMMUNITY)
Admission: RE | Admit: 2011-12-19 | Discharge: 2011-12-19 | Disposition: A | Discharge status: Home / Self Care | Payer: 59 | Source: Ambulatory Visit | Attending: Cardiology | Admitting: Cardiology

## 2011-12-21 ENCOUNTER — Encounter (HOSPITAL_COMMUNITY)
Admission: RE | Admit: 2011-12-21 | Discharge: 2011-12-21 | Disposition: A | Discharge status: Home / Self Care | Payer: 59 | Source: Ambulatory Visit | Attending: Cardiology | Admitting: Cardiology

## 2011-12-23 ENCOUNTER — Encounter (HOSPITAL_COMMUNITY)
Admission: RE | Admit: 2011-12-23 | Discharge: 2011-12-23 | Disposition: A | Discharge status: Home / Self Care | Payer: 59 | Source: Ambulatory Visit | Attending: Cardiology | Admitting: Cardiology

## 2011-12-26 ENCOUNTER — Encounter (HOSPITAL_COMMUNITY)
Admission: RE | Admit: 2011-12-26 | Discharge: 2011-12-26 | Disposition: A | Discharge status: Home / Self Care | Payer: 59 | Source: Ambulatory Visit | Attending: Cardiology | Admitting: Cardiology

## 2011-12-28 ENCOUNTER — Encounter (HOSPITAL_COMMUNITY)
Admission: RE | Admit: 2011-12-28 | Discharge: 2011-12-28 | Disposition: A | Discharge status: Home / Self Care | Payer: 59 | Source: Ambulatory Visit | Attending: Cardiology | Admitting: Cardiology

## 2011-12-30 ENCOUNTER — Encounter (HOSPITAL_COMMUNITY): Payer: 59

## 2012-01-02 ENCOUNTER — Encounter (HOSPITAL_COMMUNITY)
Admission: RE | Admit: 2012-01-02 | Discharge: 2012-01-02 | Disposition: A | Discharge status: Home / Self Care | Payer: 59 | Source: Ambulatory Visit | Attending: Cardiology | Admitting: Cardiology

## 2012-01-03 ENCOUNTER — Ambulatory Visit
Admission: RE | Admit: 2012-01-03 | Discharge: 2012-01-03 | Disposition: A | Discharge status: Home / Self Care | Payer: 59 | Source: Ambulatory Visit | Attending: Interventional Radiology | Admitting: Interventional Radiology

## 2012-01-03 ENCOUNTER — Ambulatory Visit (HOSPITAL_COMMUNITY)
Admission: RE | Admit: 2012-01-03 | Discharge: 2012-01-03 | Disposition: A | Discharge status: Home / Self Care | Payer: 59 | Source: Ambulatory Visit | Attending: Interventional Radiology | Admitting: Interventional Radiology

## 2012-01-03 DIAGNOSIS — Z9089 Acquired absence of other organs: Secondary | ICD-10-CM | POA: Insufficient documentation

## 2012-01-03 DIAGNOSIS — Q619 Cystic kidney disease, unspecified: Secondary | ICD-10-CM | POA: Insufficient documentation

## 2012-01-03 DIAGNOSIS — C649 Malignant neoplasm of unspecified kidney, except renal pelvis: Secondary | ICD-10-CM

## 2012-01-03 DIAGNOSIS — Z992 Dependence on renal dialysis: Secondary | ICD-10-CM

## 2012-01-03 NOTE — Progress Notes (Signed)
Yearly f/u of Rt renal cryoablation.  Still having dialysis M_W_F,  Just had prostate Bx last week.    From our standpoint, no symptoms or complaints from the cryoablation.

## 2012-01-04 ENCOUNTER — Encounter (HOSPITAL_COMMUNITY)
Admission: RE | Admit: 2012-01-04 | Discharge: 2012-01-04 | Disposition: A | Discharge status: Home / Self Care | Payer: 59 | Source: Ambulatory Visit | Attending: Cardiology | Admitting: Cardiology

## 2012-01-04 DIAGNOSIS — I2510 Atherosclerotic heart disease of native coronary artery without angina pectoris: Secondary | ICD-10-CM | POA: Insufficient documentation

## 2012-01-04 DIAGNOSIS — E785 Hyperlipidemia, unspecified: Secondary | ICD-10-CM | POA: Insufficient documentation

## 2012-01-04 DIAGNOSIS — Z5189 Encounter for other specified aftercare: Secondary | ICD-10-CM | POA: Insufficient documentation

## 2012-01-04 DIAGNOSIS — E118 Type 2 diabetes mellitus with unspecified complications: Secondary | ICD-10-CM | POA: Insufficient documentation

## 2012-01-05 NOTE — Progress Notes (Signed)
Patient ID: [name], male   DOB: 02-08-1951, 61 y.o.   MRN: [id number]  ESTABLISHED PATIENT OFFICE VISIT  Chief Complaint: Status post percutaneous cryoablation of a left renal carcinoma on 03/12/2010 and prior radiofrequency ablation of a right renal carcinoma in January, 2009.  History: The patient returns for yearly follow-up of prior bilateral renal ablation. Since his last visit, the patient is status post coronary artery bypass graft surgery x two by Dr. Gilford Raid on 09/29/2011. He tolerated the surgery very well and is in cardiac rehab. He is currently being evaluated at [facility] for possible renal transplantation. A prostate biopsy was performed last week by Dr. [name] due to elevation of PSA. He continues to receive dialysis via a left arm AV fistula without problems.  Review of Systems: The patient denies any hematuria or dysuria. He produces very little urine. He has had no abdominal or flank pain. He denies fever or chills.  Exam: Vital signs: Blood pressure 96/60, pulse 59, respirations 12, temperature 97.7, oxygen saturation 99% on room air. General: No acute distress. Abdomen: Soft and nontender. No flank pain.  Imaging: MRI of the abdomen was performed today without contrast. This shows stable post ablation changes in bilateral kidneys without evidence of recurrent or enlarging masses. No new renal lesions are identified bilaterally.  Assessment and Plan: Mr. Debruyn is doing well and shows no sign of recurrent malignancy in the kidneys status post prior percutaneous ablation of bilateral renal carcinomas. I have recommended additional follow-up in 1 year.

## 2012-01-06 ENCOUNTER — Encounter (HOSPITAL_COMMUNITY)
Admission: RE | Admit: 2012-01-06 | Discharge: 2012-01-06 | Disposition: A | Discharge status: Home / Self Care | Payer: 59 | Source: Ambulatory Visit | Attending: Cardiology | Admitting: Cardiology

## 2012-01-09 ENCOUNTER — Encounter (HOSPITAL_COMMUNITY)
Admission: RE | Admit: 2012-01-09 | Discharge: 2012-01-09 | Disposition: A | Discharge status: Home / Self Care | Payer: 59 | Source: Ambulatory Visit | Attending: Cardiology | Admitting: Cardiology

## 2012-01-11 ENCOUNTER — Encounter (HOSPITAL_COMMUNITY)
Admission: RE | Admit: 2012-01-11 | Discharge: 2012-01-11 | Disposition: A | Discharge status: Home / Self Care | Payer: 59 | Source: Ambulatory Visit | Attending: Cardiology | Admitting: Cardiology

## 2012-01-13 ENCOUNTER — Telehealth: Payer: Self-pay | Admitting: Cardiology

## 2012-01-13 ENCOUNTER — Encounter (HOSPITAL_COMMUNITY)
Admission: RE | Admit: 2012-01-13 | Discharge: 2012-01-13 | Disposition: A | Discharge status: Home / Self Care | Payer: 59 | Source: Ambulatory Visit | Attending: Cardiology | Admitting: Cardiology

## 2012-01-13 NOTE — Telephone Encounter (Signed)
I spoke with wife, Velva Harman, about pt not needing antibiotics s/p cabg. She wanted [place] to also know that Kiya has Prostate cancer & not sure about being able to have a kidney transplant.  "Dasani just seems down right now but he is still going to walk in the [facility] walk next week". Our prayers are with them both. Horton Chin RN

## 2012-01-13 NOTE — Telephone Encounter (Signed)
New problem:  Has a dental appt on 10/15. Need a Rx called in to [facility] on [address]  [phone].

## 2012-01-16 ENCOUNTER — Encounter (HOSPITAL_COMMUNITY)
Admission: RE | Admit: 2012-01-16 | Discharge: 2012-01-16 | Disposition: A | Discharge status: Home / Self Care | Payer: 59 | Source: Ambulatory Visit | Attending: Cardiology | Admitting: Cardiology

## 2012-01-18 ENCOUNTER — Encounter (HOSPITAL_COMMUNITY)
Admission: RE | Admit: 2012-01-18 | Discharge: 2012-01-18 | Disposition: A | Discharge status: Home / Self Care | Payer: 59 | Source: Ambulatory Visit | Attending: Cardiology | Admitting: Cardiology

## 2012-01-20 ENCOUNTER — Encounter (HOSPITAL_COMMUNITY)
Admission: RE | Admit: 2012-01-20 | Discharge: 2012-01-20 | Disposition: A | Discharge status: Home / Self Care | Payer: 59 | Source: Ambulatory Visit | Attending: Cardiology | Admitting: Cardiology

## 2012-01-23 ENCOUNTER — Encounter (HOSPITAL_COMMUNITY)
Admission: RE | Admit: 2012-01-23 | Discharge: 2012-01-23 | Disposition: A | Discharge status: Home / Self Care | Payer: 59 | Source: Ambulatory Visit | Attending: Cardiology | Admitting: Cardiology

## 2012-01-25 ENCOUNTER — Encounter (HOSPITAL_COMMUNITY)
Admission: RE | Admit: 2012-01-25 | Discharge: 2012-01-25 | Disposition: A | Discharge status: Home / Self Care | Payer: 59 | Source: Ambulatory Visit | Attending: Cardiology | Admitting: Cardiology

## 2012-01-27 ENCOUNTER — Encounter (HOSPITAL_COMMUNITY)
Admission: RE | Admit: 2012-01-27 | Discharge: 2012-01-27 | Disposition: A | Discharge status: Home / Self Care | Payer: 59 | Source: Ambulatory Visit | Attending: Cardiology | Admitting: Cardiology

## 2012-01-29 ENCOUNTER — Other Ambulatory Visit: Payer: Self-pay | Admitting: Physician Assistant

## 2012-01-30 ENCOUNTER — Encounter (HOSPITAL_COMMUNITY): Payer: 59

## 2012-01-30 ENCOUNTER — Other Ambulatory Visit: Payer: Self-pay | Admitting: Cardiology

## 2012-01-30 DIAGNOSIS — C61 Malignant neoplasm of prostate: Secondary | ICD-10-CM | POA: Insufficient documentation

## 2012-02-01 ENCOUNTER — Encounter (HOSPITAL_COMMUNITY)
Admission: RE | Admit: 2012-02-01 | Discharge: 2012-02-01 | Disposition: A | Discharge status: Home / Self Care | Payer: 59 | Source: Ambulatory Visit | Attending: Cardiology | Admitting: Cardiology

## 2012-02-03 ENCOUNTER — Encounter (HOSPITAL_COMMUNITY): Payer: 59

## 2012-02-06 ENCOUNTER — Encounter (HOSPITAL_COMMUNITY)
Admission: RE | Admit: 2012-02-06 | Discharge: 2012-02-06 | Disposition: A | Discharge status: Home / Self Care | Payer: 59 | Source: Ambulatory Visit | Attending: Cardiology | Admitting: Cardiology

## 2012-02-06 ENCOUNTER — Encounter (HOSPITAL_COMMUNITY): Payer: Self-pay

## 2012-02-06 DIAGNOSIS — E785 Hyperlipidemia, unspecified: Secondary | ICD-10-CM | POA: Insufficient documentation

## 2012-02-06 DIAGNOSIS — E118 Type 2 diabetes mellitus with unspecified complications: Secondary | ICD-10-CM | POA: Insufficient documentation

## 2012-02-06 DIAGNOSIS — I2510 Atherosclerotic heart disease of native coronary artery without angina pectoris: Secondary | ICD-10-CM | POA: Insufficient documentation

## 2012-02-06 DIAGNOSIS — Z5189 Encounter for other specified aftercare: Secondary | ICD-10-CM | POA: Insufficient documentation

## 2012-02-06 NOTE — Progress Notes (Signed)
Pt graduated from cardiac rehab.  Pt plans to exercise on his own at his home gym. Pt states he is in "training" for [employer] Walk in May 2014.  Pt weight up 3kg today from 5 days ago.  Pt has been out of town at [employer] and admits to increased food and liquid intake.  Pt ate at restaurants with several celebration meals this weekend.  Pt asymptomatic, no peripheral edema noted.   Pt will go to hemodialysis today as scheduled.

## 2012-02-08 ENCOUNTER — Encounter (HOSPITAL_COMMUNITY): Payer: 59

## 2012-02-10 ENCOUNTER — Encounter (HOSPITAL_COMMUNITY): Payer: 59

## 2012-03-04 HISTORY — PX: INSERTION PROSTATE RADIATION SEED: SUR718

## 2012-03-29 ENCOUNTER — Telehealth: Payer: Self-pay | Admitting: *Deleted

## 2012-03-29 NOTE — Telephone Encounter (Signed)
Received refill order for metoprolol. Called pt and spoke with him and his wife, Velva Harman. She notes pt blood pressure has been very low. He has received his seed implant for his prostate cancer. Pt has been taking the metoprolol 12.5mg  only at bedtime b/c his blood pressure has been "so low before dialysis 53/35 last week and they had to give him fluids". States it was 90/60 this week.  No refill sent in for metoprolol. Pt is overdue for follow-up visit from 7/13 with Dr. Verl Blalock.  Appt made for 04/11/12 with Richardson Dopp PA same day Dr. Verl Blalock in office. Pt has a 9:30am dialysis appt after this ov. Horton Chin RN

## 2012-03-30 NOTE — Telephone Encounter (Signed)
[name] appt is 8:50 with me and dialysis session at 9:30. Not sure it is realistic he will make it to dialysis on time. Do you think we should try to get him to come in after [name] dialysis? Richardson Dopp, PA-C  7:50 AM 03/30/2012

## 2012-03-30 NOTE — Telephone Encounter (Signed)
[name] his wife said this was completely doable yesterday. The only problem is his blood pressure. As he is holding am lopressor daily. Dr. Verl Blalock is also here that day. He does not get out of dialysis until around 3-3:30. Horton Chin RN

## 2012-04-11 ENCOUNTER — Ambulatory Visit (INDEPENDENT_AMBULATORY_CARE_PROVIDER_SITE_OTHER): Payer: 59 | Admitting: Physician Assistant

## 2012-04-11 ENCOUNTER — Encounter: Payer: Self-pay | Admitting: Physician Assistant

## 2012-04-11 VITALS — BP 110/62 | HR 62 | Ht 76.0 in | Wt 219.1 lb

## 2012-04-11 DIAGNOSIS — N186 End stage renal disease: Secondary | ICD-10-CM

## 2012-04-11 DIAGNOSIS — I2510 Atherosclerotic heart disease of native coronary artery without angina pectoris: Secondary | ICD-10-CM

## 2012-04-11 DIAGNOSIS — Z992 Dependence on renal dialysis: Secondary | ICD-10-CM

## 2012-04-11 DIAGNOSIS — I953 Hypotension of hemodialysis: Secondary | ICD-10-CM

## 2012-04-11 DIAGNOSIS — I10 Essential (primary) hypertension: Secondary | ICD-10-CM

## 2012-04-11 NOTE — Progress Notes (Addendum)
[address], [place], [place]  [address] Phone: [phone], Fax:  [phone]  Date:  04/11/2012   Name:  [name]   DOB:  1951/02/16   MRN:  [id number]  PCP:  Janalyn Rouse, MD  Primary Cardiologist:  Dr. Jenell Milliner  Primary Electrophysiologist:  None    History of Present Illness: [name] is a 62 y.o. male who returns for evaluation of low BP.  He has a hx of HTN, diet controlled DM2, HL, ESRD, bilateral renal carcinoma s/p cryo-ablation and CAD. He was in the process of being eval for renal transplant at [facility] in 09/2011. Myoview 08/30/11 at [facility]: No scar, + mild apical, apical lateral, mid anterolateral ischemia, EF 49%.  He was set up for LHC that demonstrated 3v CAD and he was ultimately set up for CABG.  Pre-CABG dopplers negative for ICA stenosis.  Grafts included L-LAD, S-OM.  Post op course was fairly uneventful.  He was last seen in this office by Dr. Jenell Milliner 7/13 after his surgery.  Since last seen, he was dx with prostate CA.  He is s/p radiation seed implantation at [facility].  Patient called in recently with low BPs at dialysis.  He is only taking Metoprolol 1/2 tab QPM.  No syncope or near syncope.  He feels bad with low BPs.  His wife notes BP of 123456 systolic at times.  He typically requires fluids and his EDW has been changed several times.  He denies chest pain, dyspnea, orthopnea, PND, edema.  Labs (6/13):   ALT 35, LDL 32, Hgb A1c 6.0,  Labs (7/13):   K 4.4, creatinine 5.90, Hgb 7.3,   Wt Readings from Last 3 Encounters:  04/11/12 219 lb 1.9 oz (99.392 kg)  11/03/11 201 lb 11.5 oz (91.5 kg)  10/25/11 196 lb (88.905 kg)     Past Medical History  Diagnosis Date  . CAD, NATIVE VESSEL 01/08/2008    a. Myoview 08/30/11 at [facility]: No scar, + mild apical, apical lateral, mid anterolateral ischemia, EF 49% => s/p CABG (L-LAD, S-OM)  . HYPERLIPIDEMIA-MIXED 01/08/2008  . OVERWEIGHT/OBESITY 01/08/2008  . HYPERTENSION 01/27/2009  . Anemia     . Blood transfusion ~ 2003  . Bruises easily   . Blind     both eyes removed   . Family history of breast cancer     mother  . Cellulitis late 1980's    "hospitalized; wrapped both legs; several times; no OR for this"  . Peripheral vascular disease   . ESRD (end stage renal disease) on dialysis     09/27/11 [facility]; Mon, Wed, Fri  . Pneumonia 2000;s  . DM type 2 (diabetes mellitus, type 2)   . Renal cell carcinoma     "both kidneys"    Current Outpatient Prescriptions  Medication Sig Dispense Refill  . aspirin EC 81 MG tablet Take 81 mg by mouth daily.      [name] atorvastatin (LIPITOR) 10 MG tablet TAKE 1 TABLET EVERY DAY  30 tablet  9  . b complex-vitamin c-folic acid (NEPHRO-VITE) 0.8 MG TABS Take 0.8 mg by mouth at bedtime.      . calcium acetate (PHOSLO) 667 MG capsule Take 2,001 mg by mouth 3 (three) times daily with meals.       . darbepoetin (ARANESP) 150 MCG/0.3ML SOLN Inject 0.3 mLs (150 mcg total) into the vein every Friday with hemodialysis.  1.68 mL    . docusate sodium (COLACE) 100  MG capsule Take 100 mg by mouth daily as needed. constipation      . iron dextran complex (INFED) 50 MG/ML injection Inject 1 mL (50 mg total) into the vein every Wednesday with hemodialysis.  2 mL    . metoprolol tartrate (LOPRESSOR) 25 MG tablet       . sodium chloride 0.9 % SOLN 100 mL with iron dextran complex 50 MG/ML SOLN 50 mg Inject 50 mg into the vein every 7 (seven) days.        Allergies: Allergies  Allergen Reactions  . Codeine Other (See Comments)    Makes patient incoherent. STATES MAKES HIM COMATOSE  . Tape Other (See Comments)    Plastic tape tears skin off, please use paper tape instead.    Social History:  The patient  reports that he has never smoked. He has never used smokeless tobacco. He reports that he does not drink alcohol or use illicit drugs.   ROS:  Please see the history of present illness.     All other systems reviewed and negative.   PHYSICAL  EXAM: VS:  BP 110/62  Pulse 62  Ht 6\' 4"  (1.93 m)  Wt 219 lb 1.9 oz (99.392 kg)  BMI 26.67 kg/m2 Well nourished, well developed, in no acute distress HEENT: normal Neck: no JVD at 90 Cardiac:  normal S1, S2; RRR; no murmur Lungs:  clear to auscultation bilaterally, no wheezing, rhonchi or rales Abd: soft, nontender, no hepatomegaly Ext: no edema Skin: warm and dry Neuro:  CNs 2-12 intact, no focal abnormalities noted  EKG:  NSR, HR 62, low-voltage, first degree AV block, PACs, no significant change when compared to prior tracing     ASSESSMENT AND PLAN:  1. Hypertension:   Patient is actually experiencing hypotension at dialysis. This has limited his sessions somewhat. He is on metoprolol. I have recommended that he change his metoprolol to q. a.m.  He will hold this the mornings of his dialysis.  If his BPs continue to drop with dialysis he can stop his Metoprolol.  If he continues to have problems with low BPs at dialysis, we may need to consider adding Midodrine if nephrology has no objections.  Check TSH.  2. Coronary Artery Disease:  He does not have a hx of MI.  If we have to d/c he beta blocker, it would be acceptable.  3. Hyperlipidemia:  Continue statin.  4. Disposition:  Follow up with Dr. Jenell Milliner in 3 mos.   Danton Sewer, PA-C  9:04 AM 04/11/2012

## 2012-04-11 NOTE — Patient Instructions (Addendum)
Follow up with DR. WALL in 3 months  START TAKING METOPROLOL 1/2 TAB IN THE MORNING  HOLD METOPROLOL THE DAYS YOU HAVE DIALYSIS  YOU HAVE  BEEN GIVEN AN RX TO HAVE DIALYSIS GET LAB A TSH TODAY AND FAX RESULTS TO [place], [place]

## 2012-04-27 ENCOUNTER — Telehealth: Payer: Self-pay | Admitting: Cardiology

## 2012-04-27 NOTE — Telephone Encounter (Signed)
**Note De-Identified [name] Obfuscation** Pt states that his systolic BP drops during dialysis to the 40's and wants to be seen sooner than April appt with Dr. Verl Blalock. Appt scheduled for 1/29 with Kathleen Argue, PA-C, pt aware.

## 2012-04-27 NOTE — Telephone Encounter (Signed)
Since pt has been off b/p meds his b/p is still running low

## 2012-04-27 NOTE — Telephone Encounter (Signed)
Patient called no answer.LMTC. 

## 2012-05-02 ENCOUNTER — Ambulatory Visit: Payer: 59 | Admitting: Physician Assistant

## 2012-05-04 ENCOUNTER — Ambulatory Visit (INDEPENDENT_AMBULATORY_CARE_PROVIDER_SITE_OTHER): Payer: 59 | Admitting: Nurse Practitioner

## 2012-05-04 ENCOUNTER — Encounter: Payer: Self-pay | Admitting: Nurse Practitioner

## 2012-05-04 VITALS — BP 130/80 | HR 60 | Ht 76.0 in | Wt 217.0 lb

## 2012-05-04 DIAGNOSIS — I951 Orthostatic hypotension: Secondary | ICD-10-CM

## 2012-05-04 MED ORDER — MIDODRINE HCL 5 MG PO TABS: 5.0000 mg | ORAL_TABLET | Freq: Two times a day (BID) | ORAL | Status: DC

## 2012-05-04 NOTE — Progress Notes (Signed)
[name] Date of Birth: 05/29/1950 Medical Record [id number]  History of Present Illness: [name] is seen back today for a work in visit. He is seen for [name]. He was here earlier this month with hypotension with dialysis. Metoprolol was cut back and subsequently stopped. He has a history of HTN, diet controlled DM2, HL, ESRD, bilateral renal carcinoma s/p cryoablation and CAD. Was undergoing evaluation for renal transplant at [facility] back in the summer. Myoview was abnormal which led to cath, which ultimately led to CABG with LIMA to the LAD, SVG to the OM. Other issues include prostate cancer, s/p radiation seed implantation.   He was here earlier this month with reported hypotension with dialysis which was limiting his sessions. Metoprolol was changed to QAM and he was advised to hold on his dialysis days. This was subsequently stopped.   He comes back today. He is here with his wife. Continues to have issues with low blood pressure on his dialysis days only. She reports systolic readings in the [id number] but primarily in the 60 to 70's. His flowsheet does not reflect this. BP's are low but not that low. She says this flow sheet is not accurate. He feels bad with this. He requires saline to get his BP up. No chest pain. Not short of breath. Everything else has been going ok.   Current Outpatient Prescriptions on File Prior to Visit  Medication Sig Dispense Refill  . aspirin EC 81 MG tablet Take 81 mg by mouth daily.      [name] atorvastatin (LIPITOR) 10 MG tablet TAKE 1 TABLET EVERY DAY  30 tablet  9  . b complex-vitamin c-folic acid (NEPHRO-VITE) 0.8 MG TABS Take 0.8 mg by mouth at bedtime.      . calcium acetate (PHOSLO) 667 MG capsule Take 2,001 mg by mouth 3 (three) times daily with meals.       . darbepoetin (ARANESP) 150 MCG/0.3ML SOLN Inject 0.3 mLs (150 mcg total) into the vein every Friday with hemodialysis.  1.68 mL    . iron dextran complex (INFED) 50 MG/ML injection Inject 1 mL  (50 mg total) into the vein every Wednesday with hemodialysis.  2 mL    . sodium chloride 0.9 % SOLN 100 mL with iron dextran complex 50 MG/ML SOLN 50 mg Inject 50 mg into the vein every 7 (seven) days.        Allergies  Allergen Reactions  . Codeine Other (See Comments)    Makes patient incoherent. STATES MAKES HIM COMATOSE  . Tape Other (See Comments)    Plastic tape tears skin off, please use paper tape instead.    Past Medical History  Diagnosis Date  . CAD, NATIVE VESSEL 01/08/2008    a. Myoview 08/30/11 at [facility]: No scar, + mild apical, apical lateral, mid anterolateral ischemia, EF 49% => s/p CABG (L-LAD, S-OM)  . HYPERLIPIDEMIA-MIXED 01/08/2008  . OVERWEIGHT/OBESITY 01/08/2008  . HYPERTENSION 01/27/2009  . Anemia   . Blood transfusion ~ 2003  . Bruises easily   . Blind     both eyes removed   . Family history of breast cancer     mother  . Cellulitis late 1980's    "hospitalized; wrapped both legs; several times; no OR for this"  . Peripheral vascular disease   . ESRD (end stage renal disease) on dialysis     09/27/11 [facility]; Mon, Wed, Fri  . Pneumonia 2000;s  . DM type 2 (diabetes mellitus, type 2)   .  Renal cell carcinoma     "both kidneys"    Past Surgical History  Procedure Date  . Venectomy 1980's    "knees down; both legs; 2 separate times  . Foot amputation ~ 2002    right;  partial; "infection"  . Cholecystectomy 1992  . Colonoscopy 06/14/2011    Procedure: COLONOSCOPY;  Surgeon: Nicholas Castle, MD;  Location: [facility];  Service: Endoscopy;  Laterality: N/A;  . Hot hemostasis 06/14/2011    Procedure: HOT HEMOSTASIS (ARGON PLASMA COAGULATION/BICAP);  Surgeon: Nicholas Castle, MD;  Location: [facility];  Service: Endoscopy;  Laterality: N/A;  . Av fistula placement 02/2010    LFA  . Varicose vein surgery mid 1980's    BLE  . Radiofrequency ablation kidney 2010-2012    "twice; one on each side; for cancer"  . Enucleation 2003; 2006    bilateral;  "diabetes; pain"  . Cardiac catheterization ~ 2001  . Coronary artery bypass graft 09/29/2011    Procedure: CORONARY ARTERY BYPASS GRAFTING (CABG);  Surgeon: Nicholas Pollack, MD;  Location: [place];  Service: Open Heart Surgery;  Laterality: N/A;  Coronary Artery Bypass Graft times two utilizing the left internal mammary artery and the right greater saphenous vein harvested endoscopically.    History  Smoking status  . Never Smoker   Smokeless tobacco  . Never Used    History  Alcohol Use No    Family History  Problem Relation Age of Onset  . Cancer    . Coronary artery disease    . Kidney disease    . Breast cancer    . Ovarian cancer    . Lung cancer    . Diabetes    . Cancer Mother 11    breast, spine and ovarian  . Cancer Father 60    kidney, prostate  . Emphysema Father   . Cancer Sister     ovarian  . Cancer Brother     lung  . Malignant hyperthermia Neg Hx     Review of Systems: The review of systems is per the HPI.  All other systems were reviewed and are negative.  Physical Exam: BP 130/80  Pulse 60  Ht 6\' 4"  (1.93 m)  Wt 217 lb (98.431 kg)  BMI 26.41 kg/m2 Patient is very pleasant and in no acute distress. Skin is warm and dry. Color is normal.  HEENT is unremarkable except he is blind. Normocephalic/atraumatic. PERRL. Sclera are nonicteric. Neck is supple. No masses. No JVD. Lungs are clear. Cardiac exam shows a regular rate and rhythm. Abdomen is soft. Extremities are without edema. Gait and ROM are intact. No gross neurologic deficits noted.   LABORATORY DATA: N/A   Assessment / Plan:  1. Hypotension - just with dialysis. Have spoken with [name] here in the office this morning. Will add Midodrine 5 mg BID just on his dialysis days. I have asked his wife to start monitoring his blood pressure at home as well. We will tentatively see him back in April.   2. ESRD - on dialysis  3. CAD - no history of MI. S/P CABG x 2 back in 2013 - no chest pain.     Patient is agreeable to this plan and will call if any problems develop in the interim.

## 2012-05-04 NOTE — Patient Instructions (Addendum)
Lets add Midodrine 5 mg to take two times a day (morning and early afternoon) only on the days of dialysis  Monitor his blood pressure at home - keep a diary at home  See Dr. Verl Blalock as planned in April  Call the [facility] office at [phone] if you have any questions, problems or concerns.

## 2012-05-08 ENCOUNTER — Telehealth: Payer: Self-pay | Admitting: Cardiology

## 2012-05-08 NOTE — Telephone Encounter (Signed)
Reviewed Midodrine instructions with pt.

## 2012-05-08 NOTE — Telephone Encounter (Signed)
New problem   Patient calling    Start new medication on tomorrow. Should he take toward his hemodialysis treatment instead of later.

## 2012-05-23 ENCOUNTER — Telehealth: Payer: Self-pay | Admitting: Cardiology

## 2012-05-23 NOTE — Telephone Encounter (Signed)
lmtcb Debbie Jyair Kiraly RN  

## 2012-05-23 NOTE — Telephone Encounter (Signed)
New Problem:    Called in to give the patient recent TSH result that Richardson Dopp had requested.  Please call back.

## 2012-05-23 NOTE — Telephone Encounter (Signed)
errror Horton Chin RN

## 2012-05-23 NOTE — Telephone Encounter (Signed)
IPTH results  From 10/13  was 245                                   1/14  was [place]

## 2012-06-01 ENCOUNTER — Encounter: Payer: Self-pay | Admitting: Gastroenterology

## 2012-06-19 ENCOUNTER — Emergency Department (HOSPITAL_COMMUNITY)
Admission: EM | Admit: 2012-06-19 | Discharge: 2012-06-19 | Disposition: A | Discharge status: Home / Self Care | Payer: 59 | Attending: Emergency Medicine | Admitting: Emergency Medicine

## 2012-06-19 ENCOUNTER — Encounter (HOSPITAL_COMMUNITY): Payer: Self-pay | Admitting: *Deleted

## 2012-06-19 DIAGNOSIS — I2510 Atherosclerotic heart disease of native coronary artery without angina pectoris: Secondary | ICD-10-CM | POA: Insufficient documentation

## 2012-06-19 DIAGNOSIS — Z79899 Other long term (current) drug therapy: Secondary | ICD-10-CM | POA: Insufficient documentation

## 2012-06-19 DIAGNOSIS — N186 End stage renal disease: Secondary | ICD-10-CM | POA: Insufficient documentation

## 2012-06-19 DIAGNOSIS — Z992 Dependence on renal dialysis: Secondary | ICD-10-CM | POA: Insufficient documentation

## 2012-06-19 DIAGNOSIS — I10 Essential (primary) hypertension: Secondary | ICD-10-CM | POA: Insufficient documentation

## 2012-06-19 DIAGNOSIS — Z7982 Long term (current) use of aspirin: Secondary | ICD-10-CM | POA: Insufficient documentation

## 2012-06-19 DIAGNOSIS — Z8701 Personal history of pneumonia (recurrent): Secondary | ICD-10-CM | POA: Insufficient documentation

## 2012-06-19 DIAGNOSIS — E1129 Type 2 diabetes mellitus with other diabetic kidney complication: Secondary | ICD-10-CM | POA: Insufficient documentation

## 2012-06-19 DIAGNOSIS — E669 Obesity, unspecified: Secondary | ICD-10-CM | POA: Insufficient documentation

## 2012-06-19 DIAGNOSIS — Z85528 Personal history of other malignant neoplasm of kidney: Secondary | ICD-10-CM | POA: Insufficient documentation

## 2012-06-19 DIAGNOSIS — Z8619 Personal history of other infectious and parasitic diseases: Secondary | ICD-10-CM | POA: Insufficient documentation

## 2012-06-19 DIAGNOSIS — R58 Hemorrhage, not elsewhere classified: Secondary | ICD-10-CM | POA: Insufficient documentation

## 2012-06-19 DIAGNOSIS — I739 Peripheral vascular disease, unspecified: Secondary | ICD-10-CM | POA: Insufficient documentation

## 2012-06-19 DIAGNOSIS — H543 Unqualified visual loss, both eyes: Secondary | ICD-10-CM | POA: Insufficient documentation

## 2012-06-19 DIAGNOSIS — Z862 Personal history of diseases of the blood and blood-forming organs and certain disorders involving the immune mechanism: Secondary | ICD-10-CM | POA: Insufficient documentation

## 2012-06-19 DIAGNOSIS — E782 Mixed hyperlipidemia: Secondary | ICD-10-CM | POA: Insufficient documentation

## 2012-06-19 DIAGNOSIS — S98919A Complete traumatic amputation of unspecified foot, level unspecified, initial encounter: Secondary | ICD-10-CM | POA: Insufficient documentation

## 2012-06-19 LAB — POCT I-STAT, CHEM 8
Calcium, Ion: 1.18 mmol/L (ref 1.13–1.30)
Creatinine, Ser: 6.4 mg/dL — ABNORMAL HIGH (ref 0.50–1.35)
Glucose, Bld: 141 mg/dL — ABNORMAL HIGH (ref 70–99)
HCT: 28 % — ABNORMAL LOW (ref 39.0–52.0)
Hemoglobin: 9.5 g/dL — ABNORMAL LOW (ref 13.0–17.0)
TCO2: 33 mmol/L (ref 0–100)

## 2012-06-19 MED ORDER — SULFAMETHOXAZOLE-TRIMETHOPRIM 800-160 MG PO TABS: 1.0000 | ORAL_TABLET | Freq: Two times a day (BID) | ORAL | Status: DC

## 2012-06-19 MED ORDER — SULFAMETHOXAZOLE-TMP DS 800-160 MG PO TABS
1.0000 | ORAL_TABLET | Freq: Once | ORAL | Status: AC
  Administered 2012-06-19: 1 via ORAL
  Filled 2012-06-19: qty 1

## 2012-06-19 NOTE — ED Notes (Signed)
Pt has dressing to left foot, sock placed on left foot and ortho at bedside for post op boot placement. Pt tolerated procedure well and continues to deny any pain.

## 2012-06-19 NOTE — ED Notes (Signed)
Pt denies any pain or further questions upon discharge.

## 2012-06-19 NOTE — ED Provider Notes (Signed)
History    This chart was scribed for non-physician practitioner working with Jasper Riling. Alvino Chapel, MD by Adriana Reams, ED Scribe. This patient was seen in room TR10C/TR10C and the patient's care was started at 2143.   CSN: [id number]  Arrival date & time 06/19/12  [id number]   First MD Initiated Contact with Patient 06/19/12 2143      Chief Complaint  Patient presents with  . Foot Pain     The history is provided by the patient. No language interpreter was used.   [name] is a 62 y.o. male brought in by ambulance, who presents to the Emergency Department complaining of sudden onset, constant, non changing left foot bleeding that will not stop. Pt states that he pulled dead skin off his left foot this morning. He denies any left foot pain. He currently takes blood thinners. He has a h/o DM and is currently on dialysis.    Past Medical History  Diagnosis Date  . CAD, NATIVE VESSEL 01/08/2008    a. Myoview 08/30/11 at [facility]: No scar, + mild apical, apical lateral, mid anterolateral ischemia, EF 49% => s/p CABG (L-LAD, S-OM)  . HYPERLIPIDEMIA-MIXED 01/08/2008  . OVERWEIGHT/OBESITY 01/08/2008  . HYPERTENSION 01/27/2009  . Anemia   . Blood transfusion ~ 2003  . Bruises easily   . Blind     both eyes removed   . Family history of breast cancer     mother  . Cellulitis late 1980's    "hospitalized; wrapped both legs; several times; no OR for this"  . Peripheral vascular disease   . ESRD (end stage renal disease) on dialysis     09/27/11 [facility]; Mon, Wed, Fri  . Pneumonia 2000;s  . DM type 2 (diabetes mellitus, type 2)   . Renal cell carcinoma     "both kidneys"    Past Surgical History  Procedure Laterality Date  . Venectomy  1980's    "knees down; both legs; 2 separate times  . Foot amputation  ~ 2002    right;  partial; "infection"  . Cholecystectomy  1992  . Colonoscopy  06/14/2011    Procedure: COLONOSCOPY;  Surgeon: [name], MD;  Location: [facility];   Service: Endoscopy;  Laterality: N/A;  . Hot hemostasis  06/14/2011    Procedure: HOT HEMOSTASIS (ARGON PLASMA COAGULATION/BICAP);  Surgeon: [name], MD;  Location: [facility];  Service: Endoscopy;  Laterality: N/A;  . Av fistula placement  02/2010    LFA  . Varicose vein surgery  mid 1980's    BLE  . Radiofrequency ablation kidney  2010-2012    "twice; one on each side; for cancer"  . Enucleation  2003; 2006    bilateral; "diabetes; pain"  . Cardiac catheterization  ~ 2001  . Coronary artery bypass graft  09/29/2011    Procedure: CORONARY ARTERY BYPASS GRAFTING (CABG);  Surgeon: [name], MD;  Location: [place];  Service: Open Heart Surgery;  Laterality: N/A;  Coronary Artery Bypass Graft times two utilizing the left internal mammary artery and the right greater saphenous vein harvested endoscopically.    Family History  Problem Relation Age of Onset  . Cancer    . Coronary artery disease    . Kidney disease    . Breast cancer    . Ovarian cancer    . Lung cancer    . Diabetes    . Cancer Mother 83    breast, spine and ovarian  . Cancer  Father 105    kidney, prostate  . Emphysema Father   . Cancer Sister     ovarian  . Cancer Brother     lung  . Malignant hyperthermia Neg Hx     History  Substance Use Topics  . Smoking status: Never Smoker   . Smokeless tobacco: Never Used  . Alcohol Use: No      Review of Systems  Musculoskeletal: Negative for arthralgias.  Skin: Positive for wound.  All other systems reviewed and are negative.    Allergies  Codeine and Tape  Home Medications   Current Outpatient Rx  Name  Route  Sig  Dispense  Refill  . aspirin EC 81 MG tablet   Oral   Take 81 mg by mouth every other day.          [name] atorvastatin (LIPITOR) 10 MG tablet      TAKE 1 TABLET EVERY DAY   30 tablet   9   . b complex-vitamin c-folic acid (NEPHRO-VITE) 0.8 MG TABS   Oral   Take 0.8 mg by mouth at bedtime.         . calcium acetate  (PHOSLO) 667 MG capsule   Oral   Take 2,001 mg by mouth 3 (three) times daily with meals.          . darbepoetin (ARANESP) 150 MCG/0.3ML SOLN   Intravenous   Inject 0.3 mLs (150 mcg total) into the vein every Friday with hemodialysis.   1.68 mL      . iron dextran complex (INFED) 50 MG/ML injection   Intravenous   Inject 1 mL (50 mg total) into the vein every Wednesday with hemodialysis.   2 mL      . midodrine (PROAMATINE) 5 MG tablet   Oral   Take 1 tablet (5 mg total) by mouth 2 (two) times daily.   30 tablet   6     Take only on the days on dialysis   . sodium chloride 0.9 % SOLN 100 mL with iron dextran complex 50 MG/ML SOLN 50 mg   Intravenous   Inject 50 mg into the vein every 7 (seven) days.         [name] sulfamethoxazole-trimethoprim (SEPTRA DS) 800-160 MG per tablet   Oral   Take 1 tablet by mouth every 12 (twelve) hours.   14 tablet   0     BP 109/67  Pulse 56  Temp(Src) 98 F (36.7 C) (Oral)  Resp 18  SpO2 96%  Physical Exam  Nursing note and vitals reviewed. Constitutional: He is oriented to person, place, and time. He appears well-developed and well-nourished. No distress.  HENT:  Head: Normocephalic and atraumatic.  Eyes: EOM are normal.  Neck: Neck supple. No tracheal deviation present.  Cardiovascular: Normal rate.   Pulmonary/Chest: Effort normal. No respiratory distress.  Musculoskeletal: Normal range of motion.  Neurological: He is alert and oriented to person, place, and time.  Skin: Skin is warm and dry.  Left heel has a 2 cm x 3 cm area of subcutaneous skin is missing. It is slowly oozing blood from wound site. No areas of necrosis. No pain to palpation.   Psychiatric: He has a normal mood and affect. His behavior is normal.    ED Course  Procedures (including critical care time) DIAGNOSTIC STUDIES: Oxygen Saturation is 99% on room air, normal by my interpretation.    COORDINATION OF CARE: 9:58 PM Discussed treatment plan which  includes antibiotics,  labs, wound repair, and foot boot with pt at bedside and pt agreed to plan. Discussed importance of checking wound frequently.  LACERATION REPAIR Performed by: Linus Mako Consent: Verbal consent obtained. Risks and benefits: risks, benefits and alternatives were discussed Patient identity confirmed: provided demographic data Time out performed prior to procedure Prepped and Draped in normal sterile fashion Wound explored Laceration Location: left heel Laceration Length: irregular border, 2 cm x 4 cm No Foreign Bodies seen or palpated Amount of cleaning: standard Technique: wound seal followed by pressure dressing. Bleeding controlled.  Patient tolerance: Patient tolerated the procedure well with no immediate complications.  10:32 PM Reviewed labs. Creatinine is elevated but he is due for dialysis for tomorrow and is asymptomatic. Hemoglobin is 9.5 which is better than his baseline.  Labs Reviewed  POCT I-STAT, CHEM 8 - Abnormal; Notable for the following:    BUN 97 (*)    Creatinine, Ser 6.40 (*)    Glucose, Bld 141 (*)    Hemoglobin 9.5 (*)    HCT 28.0 (*)    All other components within normal limits   No results found.   1. Bleeding from wound, initial encounter   2. ESRD (end stage renal disease)   3. Diabetes       MDM  Pt has good follow-up. Will see a physician tomorrow and wife understands importance of frequent wound checks.  Pt has been advised of the symptoms that warrant their return to the ED. Patient has voiced understanding and has agreed to follow-up with the PCP or specialist.  I personally performed the services described in this documentation, which was scribed in my presence. The recorded information has been reviewed and is accurate.         Linus Mako, PA-C 06/20/12 0015

## 2012-06-19 NOTE — ED Notes (Signed)
Per EMS. Pt is diabetic and diaylsis pt. Along with being blind. Pt has dead skin on his left heel that he pulled off today. Pt removed more skin than the dead skin and is bleeding (no blood thinners). Pt has bandage applied with some redness through the bandage. Pt has partial amputation to right foot.

## 2012-06-19 NOTE — ED Notes (Signed)
Ortho paged for post op boot.

## 2012-06-19 NOTE — Progress Notes (Signed)
Orthopedic Tech Progress Note Patient Details:  Benson Rile [facility] 03-16-1951 [id number]  Ortho Devices Type of Ortho Device: Postop shoe/boot Ortho Device/Splint Location: (L) LE Ortho Device/Splint Interventions: Application;Ordered   Braulio Bosch 06/19/2012, 11:01 PM

## 2012-06-20 NOTE — ED Provider Notes (Signed)
Medical screening examination/treatment/procedure(s) were performed by non-physician practitioner and as supervising physician I was immediately available for consultation/collaboration.  Jasper Riling. Alvino Chapel, MD 06/20/12 [id number]

## 2012-06-29 ENCOUNTER — Telehealth: Payer: Self-pay | Admitting: Gastroenterology

## 2012-06-29 NOTE — Telephone Encounter (Signed)
Pts wife just wanted [place] to know that the pt had treatment for prostate cancer 03/2011. Per wife the pts radiation doctor stated from his perspective the pt was fine to have the colonoscopy. Let wife know this would be noted in his chart.

## 2012-07-03 ENCOUNTER — Ambulatory Visit (AMBULATORY_SURGERY_CENTER): Payer: 59

## 2012-07-03 ENCOUNTER — Telehealth: Payer: Self-pay

## 2012-07-03 VITALS — Ht 76.0 in | Wt 212.0 lb

## 2012-07-03 DIAGNOSIS — Z86010 Personal history of colonic polyps: Secondary | ICD-10-CM

## 2012-07-03 DIAGNOSIS — Z1211 Encounter for screening for malignant neoplasm of colon: Secondary | ICD-10-CM

## 2012-07-03 MED ORDER — NA SULFATE-K SULFATE-MG SULF 17.5-3.13-1.6 GM/177ML PO SOLN: 1.0000 | Freq: Once | ORAL | Status: DC

## 2012-07-03 NOTE — Telephone Encounter (Signed)
Dr Deatra Ina, This patient came into the office today for his pre-visit prior to his colonoscopy with you on 07/17/12. He has a complicated medical history and had his last colonoscopy done at [facility] in 2013. Do you need to see him in the office first or should he be scheduled as a direct colonoscopy at the  hospital.   Please advise.Thanks.

## 2012-07-04 ENCOUNTER — Other Ambulatory Visit: Payer: Self-pay

## 2012-07-04 ENCOUNTER — Encounter: Payer: Self-pay | Admitting: Gastroenterology

## 2012-07-04 DIAGNOSIS — Z86010 Personal history of colonic polyps: Secondary | ICD-10-CM

## 2012-07-04 NOTE — Telephone Encounter (Signed)
Okay, but he needs colonoscopy to be scheduled at the hospital

## 2012-07-04 NOTE — Telephone Encounter (Signed)
Pt scheduled for Colon with APC at [facility] 07/24/12. Pt to arrive at 8am for a 9am appt. Pt and wife aware of appt date and time.

## 2012-07-04 NOTE — Telephone Encounter (Signed)
Best to have OV.  He will need colo with ERBE at [facility]

## 2012-07-04 NOTE — Telephone Encounter (Signed)
Dr.Kaplan, I spoke with patient's wife. She was very upset about having to bring him in for office visit. She states it is very difficult for her, she has to get off work each time and that he has several appointment's with doctors. She also states that we have all of his information. She is asking if patient could not have office visit and go ahead and have procedure scheduled. Please advise, thanks.

## 2012-07-10 ENCOUNTER — Ambulatory Visit (INDEPENDENT_AMBULATORY_CARE_PROVIDER_SITE_OTHER): Payer: 59 | Admitting: Cardiology

## 2012-07-10 ENCOUNTER — Encounter: Payer: Self-pay | Admitting: Cardiology

## 2012-07-10 VITALS — BP 112/58 | HR 58 | Ht 76.0 in | Wt 218.0 lb

## 2012-07-10 DIAGNOSIS — I10 Essential (primary) hypertension: Secondary | ICD-10-CM

## 2012-07-10 DIAGNOSIS — N186 End stage renal disease: Secondary | ICD-10-CM

## 2012-07-10 DIAGNOSIS — I739 Peripheral vascular disease, unspecified: Secondary | ICD-10-CM

## 2012-07-10 DIAGNOSIS — I2510 Atherosclerotic heart disease of native coronary artery without angina pectoris: Secondary | ICD-10-CM

## 2012-07-10 NOTE — Progress Notes (Signed)
HPI [name] returns today for evaluation and management of his coronary artery disease and history of dialysis-induced hypotension. He is now on Mididrine on his dialysis days which is helped. His blood pressure days [id number] systolic.  He is on the kidney transplant list once again. He still exercises but not as much as he used to. He spent almost a year since his bypass surgery.  He rarely complains. He's very compliant with his medications and his wife is a tremendous advocate.  Past Medical History  Diagnosis Date  . CAD, NATIVE VESSEL 01/08/2008    a. Myoview 08/30/11 at [facility]: No scar, + mild apical, apical lateral, mid anterolateral ischemia, EF 49% => s/p CABG (L-LAD, S-OM)  . HYPERLIPIDEMIA-MIXED 01/08/2008  . OVERWEIGHT/OBESITY 01/08/2008  . HYPERTENSION 01/27/2009  . Anemia   . Blood transfusion ~ 2003  . Bruises easily   . Blind     both eyes removed   . Family history of breast cancer     mother  . Cellulitis late 1980's    "hospitalized; wrapped both legs; several times; no OR for this"  . Peripheral vascular disease   . ESRD (end stage renal disease) on dialysis     09/27/11 [facility]; Mon, Wed, Fri  . Pneumonia 2000;s  . DM type 2 (diabetes mellitus, type 2)   . Renal cell carcinoma     "both kidneys"  . H/O prostate cancer     seeds implant 03/2012  . Irregular heartbeat   . Colon polyps   . Dialysis patient     M-W-F  . Hypertension due to AV shunt     left forearm    Current Outpatient Prescriptions  Medication Sig Dispense Refill  . aspirin EC 81 MG tablet Take 81 mg by mouth every other day.       [name] atorvastatin (LIPITOR) 10 MG tablet TAKE 1 TABLET EVERY DAY  30 tablet  9  . b complex-vitamin c-folic acid (NEPHRO-VITE) 0.8 MG TABS Take 0.8 mg by mouth at bedtime.      . calcium acetate (PHOSLO) 667 MG capsule Take 2,001 mg by mouth 3 (three) times daily with meals.       . darbepoetin (ARANESP) 150 MCG/0.3ML SOLN Inject 0.3 mLs (150 mcg total) into the vein  every Friday with hemodialysis.  1.68 mL    . iron dextran complex (INFED) 50 MG/ML injection Inject 1 mL (50 mg total) into the vein every Wednesday with hemodialysis.  2 mL    . midodrine (PROAMATINE) 5 MG tablet Take 1 tablet (5 mg total) by mouth 2 (two) times daily.  30 tablet  6  . Na Sulfate-K Sulfate-Mg Sulf (SUPREP BOWEL PREP) SOLN Take 1 kit by mouth once.  354 mL  0  . sodium chloride 0.9 % SOLN 100 mL with iron dextran complex 50 MG/ML SOLN 50 mg Inject 50 mg into the vein every 7 (seven) days.       No current facility-administered medications for this visit.    Allergies  Allergen Reactions  . Codeine Other (See Comments)    Makes patient incoherent. STATES MAKES HIM COMATOSE  . Tape Other (See Comments)    Plastic tape tears skin off, please use paper tape instead.    Family History  Problem Relation Age of Onset  . Cancer    . Coronary artery disease    . Kidney disease    . Breast cancer    . Ovarian cancer    .  Lung cancer    . Diabetes    . Cancer Mother 43    breast, spine and ovarian  . Emphysema Father   . Cancer Father 33    kidney, prostate  . Cancer Sister     ovarian  . Cancer Brother     lung  . Malignant hyperthermia Neg Hx     History   Social History  . Marital Status: Married    Spouse Name: N/A    Number of Children: N/A  . Years of Education: N/A   Occupational History  . Not on file.   Social History Main Topics  . Smoking status: Never Smoker   . Smokeless tobacco: Never Used  . Alcohol Use: No  . Drug Use: No  . Sexually Active: Yes   Other Topics Concern  . Not on file   Social History Narrative  . No narrative on file    ROS ALL NEGATIVE EXCEPT THOSE NOTED IN HPI  PE  General Appearance: well developed, well nourished in no acute distress HEENT: symmetrical face, PERRLA, good dentition , Neck: no JVD, thyromegaly, or adenopathy, trachea midline Chest: symmetric without deformity Cardiac: PMI non-displaced,  RRR, normal S1, S2, no gallop or murmur Lung: clear to ausculation and percussion Vascular: all pulses full without bruits  Abdominal: nondistended, nontender, good bowel sounds, no HSM, no bruits Extremities: no cyanosis, clubbing or edema, no sign of DVT, no varicosities  Skin: normal color, no rashes Neuro: alert and oriented x 3, non-focal Pysch: normal affect  EKG  BMET    Component Value Date/Time   NA 137 06/19/2012 2216   K 4.8 06/19/2012 2216   CL 97 06/19/2012 2216   CO2 25 10/05/2011 0717   GLUCOSE 141* 06/19/2012 2216   BUN 97* 06/19/2012 2216   CREATININE 6.40* 06/19/2012 2216   CALCIUM 8.6 10/05/2011 0717   GFRNONAA 9* 10/05/2011 0717   GFRAA 11* 10/05/2011 0717    Lipid Panel     Component Value Date/Time   CHOL 120 09/28/2011 0515   TRIG 75 09/28/2011 0515   HDL 73 09/28/2011 0515   CHOLHDL 1.6 09/28/2011 0515   VLDL 15 09/28/2011 0515   LDLCALC 32 09/28/2011 0515    CBC    Component Value Date/Time   WBC 3.3* 10/05/2011 0717   RBC 2.23* 10/05/2011 0717   HGB 9.5* 06/19/2012 2216   HCT 28.0* 06/19/2012 2216   PLT 95* 10/05/2011 0717   MCV 96.9 10/05/2011 0717   MCH 32.7 10/05/2011 0717   MCHC 33.8 10/05/2011 0717   RDW 14.9 10/05/2011 0717   LYMPHSABS 0.6* 09/21/2011 1620   MONOABS 0.7 09/21/2011 1620   EOSABS 0.1 09/21/2011 1620   BASOSABS 0.0 09/21/2011 1620

## 2012-07-10 NOTE — Patient Instructions (Addendum)
Your physician wants you to follow-up in: 1 year with Dr. Verl Blalock.  You will receive a reminder letter in the mail two months in advance. If you don't receive a letter, please call our office to schedule the follow-up appointment.

## 2012-07-10 NOTE — Assessment & Plan Note (Signed)
Stable. Continue secondary preventative therapy. Return the office in one year.

## 2012-07-10 NOTE — Assessment & Plan Note (Signed)
Good control with no further hypotension with dialysis. Continue current medical strategy.

## 2012-07-11 ENCOUNTER — Encounter: Payer: Self-pay | Admitting: Cardiology

## 2012-07-17 ENCOUNTER — Encounter: Payer: 59 | Admitting: Gastroenterology

## 2012-07-24 ENCOUNTER — Encounter (HOSPITAL_COMMUNITY): Payer: Self-pay | Admitting: *Deleted

## 2012-07-24 ENCOUNTER — Ambulatory Visit (HOSPITAL_COMMUNITY)
Admission: RE | Admit: 2012-07-24 | Discharge: 2012-07-24 | Disposition: A | Discharge status: Home / Self Care | Payer: 59 | Source: Ambulatory Visit | Attending: Gastroenterology | Admitting: Gastroenterology

## 2012-07-24 ENCOUNTER — Encounter (HOSPITAL_COMMUNITY)
Admission: RE | Disposition: A | Discharge status: Home / Self Care | Payer: Self-pay | Source: Ambulatory Visit | Attending: Gastroenterology

## 2012-07-24 DIAGNOSIS — E663 Overweight: Secondary | ICD-10-CM | POA: Insufficient documentation

## 2012-07-24 DIAGNOSIS — Z860100 Personal history of colon polyps, unspecified: Secondary | ICD-10-CM | POA: Insufficient documentation

## 2012-07-24 DIAGNOSIS — Z992 Dependence on renal dialysis: Secondary | ICD-10-CM | POA: Insufficient documentation

## 2012-07-24 DIAGNOSIS — D649 Anemia, unspecified: Secondary | ICD-10-CM | POA: Insufficient documentation

## 2012-07-24 DIAGNOSIS — I739 Peripheral vascular disease, unspecified: Secondary | ICD-10-CM | POA: Insufficient documentation

## 2012-07-24 DIAGNOSIS — N186 End stage renal disease: Secondary | ICD-10-CM | POA: Insufficient documentation

## 2012-07-24 DIAGNOSIS — I2510 Atherosclerotic heart disease of native coronary artery without angina pectoris: Secondary | ICD-10-CM | POA: Insufficient documentation

## 2012-07-24 DIAGNOSIS — Z85528 Personal history of other malignant neoplasm of kidney: Secondary | ICD-10-CM | POA: Insufficient documentation

## 2012-07-24 DIAGNOSIS — K5730 Diverticulosis of large intestine without perforation or abscess without bleeding: Secondary | ICD-10-CM | POA: Insufficient documentation

## 2012-07-24 DIAGNOSIS — D126 Benign neoplasm of colon, unspecified: Secondary | ICD-10-CM | POA: Insufficient documentation

## 2012-07-24 DIAGNOSIS — E119 Type 2 diabetes mellitus without complications: Secondary | ICD-10-CM | POA: Insufficient documentation

## 2012-07-24 DIAGNOSIS — I120 Hypertensive chronic kidney disease with stage 5 chronic kidney disease or end stage renal disease: Secondary | ICD-10-CM | POA: Insufficient documentation

## 2012-07-24 HISTORY — PX: COLONOSCOPY: SHX5424

## 2012-07-24 LAB — GLUCOSE, CAPILLARY: Glucose-Capillary: 118 mg/dL — ABNORMAL HIGH (ref 70–99)

## 2012-07-24 SURGERY — COLONOSCOPY
Anesthesia: Moderate Sedation

## 2012-07-24 MED ORDER — DIPHENHYDRAMINE HCL 50 MG/ML IJ SOLN
INTRAMUSCULAR | Status: DC | PRN
  Administered 2012-07-24 (×2): 12.5 mg via INTRAVENOUS

## 2012-07-24 MED ORDER — SPOT INK MARKER SYRINGE KIT
PACK | SUBMUCOSAL | Status: AC
  Filled 2012-07-24: qty 5

## 2012-07-24 MED ORDER — MIDAZOLAM HCL 10 MG/2ML IJ SOLN
INTRAMUSCULAR | Status: AC
  Filled 2012-07-24: qty 2

## 2012-07-24 MED ORDER — SPOT INK MARKER SYRINGE KIT
PACK | SUBMUCOSAL | Status: DC | PRN
  Administered 2012-07-24: 2 mL via SUBMUCOSAL

## 2012-07-24 MED ORDER — FENTANYL CITRATE 0.05 MG/ML IJ SOLN
INTRAMUSCULAR | Status: AC
  Filled 2012-07-24: qty 2

## 2012-07-24 MED ORDER — DIPHENHYDRAMINE HCL 50 MG/ML IJ SOLN
INTRAMUSCULAR | Status: AC
  Filled 2012-07-24: qty 1

## 2012-07-24 MED ORDER — MIDAZOLAM HCL 5 MG/5ML IJ SOLN
INTRAMUSCULAR | Status: DC | PRN
  Administered 2012-07-24 (×2): 2 mg via INTRAVENOUS

## 2012-07-24 MED ORDER — SODIUM CHLORIDE 0.9 % IV SOLN
INTRAVENOUS | Status: DC
  Administered 2012-07-24: 500 mL via INTRAVENOUS

## 2012-07-24 MED ORDER — FENTANYL CITRATE 0.05 MG/ML IJ SOLN
INTRAMUSCULAR | Status: DC | PRN
  Administered 2012-07-24: 25 ug via INTRAVENOUS

## 2012-07-24 NOTE — H&P (Signed)
History of Present Illness: 62 year old white male with history of renal cell carcinoma, end-stage renal disease on dialysis, coronary artery disease and hypertension here for followup colonoscopy. As a history of colonic polyposis. Last exam was in March, 2013 were multiple adenomatous polyps were removed. He is here for followup colonoscopy.    Past Medical History  Diagnosis Date  . CAD, NATIVE VESSEL 01/08/2008    a. Myoview 08/30/11 at [facility]: No scar, + mild apical, apical lateral, mid anterolateral ischemia, EF 49% => s/p CABG (L-LAD, S-OM)  . HYPERLIPIDEMIA-MIXED 01/08/2008  . OVERWEIGHT/OBESITY 01/08/2008  . HYPERTENSION 01/27/2009  . Anemia   . Blood transfusion ~ 2003  . Bruises easily   . Blind     both eyes removed   . Family history of breast cancer     mother  . Cellulitis late 1980's    "hospitalized; wrapped both legs; several times; no OR for this"  . Peripheral vascular disease   . ESRD (end stage renal disease) on dialysis     09/27/11 [facility]; Mon, Wed, Fri  . Pneumonia 2000;s  . DM type 2 (diabetes mellitus, type 2)   . Renal cell carcinoma     "both kidneys"  . H/O prostate cancer     seeds implant 03/2012  . Irregular heartbeat   . Colon polyps   . Dialysis patient     M-W-F  . Hypertension due to AV shunt     left forearm   Past Surgical History  Procedure Laterality Date  . Venectomy  1980's    "knees down; both legs; 2 separate times  . Foot amputation  ~ 2002    right;  partial; "infection"  . Cholecystectomy  1992  . Colonoscopy  06/14/2011    Procedure: COLONOSCOPY;  Surgeon: [name], MD;  Location: [facility];  Service: Endoscopy;  Laterality: N/A;  . Hot hemostasis  06/14/2011    Procedure: HOT HEMOSTASIS (ARGON PLASMA COAGULATION/BICAP);  Surgeon: [name], MD;  Location: [facility];  Service: Endoscopy;  Laterality: N/A;  . Av fistula placement  02/2010    LFA  . Varicose vein surgery  mid 1980's    BLE  .  Radiofrequency ablation kidney  2010-2012    "twice; one on each side; for cancer"  . Enucleation  2003; 2006    bilateral; "diabetes; pain"  . Cardiac catheterization  ~ 2001  . Coronary artery bypass graft  09/29/2011    Procedure: CORONARY ARTERY BYPASS GRAFTING (CABG);  Surgeon: [name], MD;  Location: [place];  Service: Open Heart Surgery;  Laterality: N/A;  Coronary Artery Bypass Graft times two utilizing the left internal mammary artery and the right greater saphenous vein harvested endoscopically.   family history includes Breast cancer in an unspecified family member; Cancer in his brother, sister, and unspecified family member; Cancer (age of onset: 58) in his father; Cancer (age of onset: 63) in his mother; Coronary artery disease in an unspecified family member; Diabetes in an unspecified family member; Emphysema in his father; Kidney disease in an unspecified family member; Lung cancer in an unspecified family member; and Ovarian cancer in an unspecified family member.  There is no history of Malignant hyperthermia. Current Facility-Administered Medications  Medication Dose Route Frequency Provider Last Rate Last Dose  . 0.9 %  sodium chloride infusion   Intravenous Continuous [name], MD       Allergies as of 07/04/2012 - Review Complete 07/03/2012  Allergen Reaction  Noted  . Codeine Other (See Comments)   . Tape Other (See Comments) 09/22/2011    reports that he has never smoked. He has never used smokeless tobacco. He reports that he does not drink alcohol or use illicit drugs.     Review of Systems: Pertinent positive and negative review of systems were noted in the above HPI section. All other review of systems were otherwise negative.  Vital signs were reviewed in today's medical record Physical Exam: General: Well developed , well nourished, no acute distress Skin: anicteric Head: Normocephalic and atraumatic Eyes:  sclerae anicteric, EOMI Ears: Normal  auditory acuity Mouth: No deformity or lesions Neck: Supple, no masses or thyromegaly Lungs: Clear throughout to auscultation Heart: Regular rate and rhythm; no murmurs, rubs or bruits Abdomen: Soft, non tender and non distended. No masses, hepatosplenomegaly or hernias noted. Normal Bowel sounds Rectal:deferred Musculoskeletal: Symmetrical with no gross deformities  Skin: No lesions on visible extremities Pulses:  Normal pulses noted Extremities: No clubbing, cyanosis, edema or deformities noted Neurological: Alert oriented x 4, grossly nonfocal Cervical Nodes:  No significant cervical adenopathy Inguinal Nodes: No significant inguinal adenopathy Psychological:  Alert and cooperative. Normal mood and affect   Impression-history of colonic polyposis  Plan-followup colonoscopy

## 2012-07-24 NOTE — Op Note (Signed)
[facility] [place], [id number]   COLONOSCOPY PROCEDURE REPORT  PATIENT: [name], [name]  MR#: [id number] BIRTHDATE: [birth year] , 68  yrs. old GENDER: Male ENDOSCOPIST: [name], MD REFERRED [id number] Dione Housekeeper, M.D. PROCEDURE DATE:  07/24/2012 PROCEDURE:   Colonoscopy with snare polypectomy and Submucosal injection, any substance ASA CLASS:   Class III INDICATIONS:Patient's personal history of colon polyps. MEDICATIONS: Versed 4 mg IV, Fentanyl 25 mcg IV, and Benadryl 25 mg IV  DESCRIPTION OF PROCEDURE:   After the risks benefits and alternatives of the procedure were thoroughly explained, informed consent was obtained.  A digital rectal exam revealed no abnormalities of the rectum.   The Pentax Ped Colon [id number] endoscope was introduced through the anus and advanced to the cecum, which was identified by both the appendix and ileocecal valve. No adverse events experienced.   The quality of the prep was Suprep fair  The instrument was then slowly withdrawn as the colon was fully examined.      COLON FINDINGS: In the ascending and proximal transverse colon there were 3 sessile polyps measuring between 5 and 10 mm.  Each was removed with cold polypectomy snare and submitted to pathology.  In the proximal descending colon there was a 1 cm sessile polyp that was removed with cold polypectomy snare.  There was a 2 cm sessile vascular polyp that was removed piecemeal with a hot polypectomy snare.  the area of both proximal and distal to the polyp was marked with 2 cc of "spot" injected submucosally.  All specimens were sent to pathology.  In the descending colon distally there were 3 sessile 5 mm polyps that were removed with cold polypectomy snare and submitted to pathology.  There were scattered diverticula in the descending colon.   In the ascending and proximal transverse colon there were 3 sessile polyps measuring between 5 and 10  mm. Each was removed with cold polypectomy snare and submitted to pathology.  In the proximal descending colon there was a 1 cm sessile polyp that was removed with cold polypectomy snare.  There was a 2 cm sessile vascular polyp that was removed piecemeal with a hot polypectomy snare.  the area of both proximal and distal to the polyp was marked with 2 cc of "spot" injected submucosally.  All specimens were sent to pathology.  In the descending colon distally there were 3 sessile 5 mm polyps that were removed with cold polypectomy snare and submitted to pathology.  There were scattered diverticula in the descending colon.   The colon mucosa was otherwise normal.  Retroflexed views revealed no abnormalities. The time to cecum=  .  Withdrawal time=minutes 0 seconds.  The scope was withdrawn and the procedure completed. COMPLICATIONS: There were no complications.  ENDOSCOPIC IMPRESSION: 1. colonic polyposis 2.  diverticulosis  RECOMMENDATIONS: followup colonoscopy in one year  eSigned:  [name], MD 07/24/2012 10:11 AM   cc:   PATIENT NAME:  [name], [name] MR#: [id number]

## 2012-07-25 ENCOUNTER — Encounter (HOSPITAL_COMMUNITY): Payer: Self-pay | Admitting: Gastroenterology

## 2012-07-27 ENCOUNTER — Encounter: Payer: Self-pay | Admitting: Gastroenterology

## 2012-08-29 ENCOUNTER — Other Ambulatory Visit: Payer: Self-pay | Admitting: Cardiology

## 2012-08-29 NOTE — Telephone Encounter (Signed)
atorvastatin (LIPITOR) 10 MG tablet  TAKE 1 TABLET EVERY DAY   30 tablet   Patient Instructions: Your physician wants you to follow-up in: 1 year with Dr. Verl Blalock.  You will receive a reminder letter in the mail two months in advance. If you don't receive a letter, please call our office to schedule the follow-up appointment. Patient Instructions History Recorded  Previous Visit  Provider Department Encounter #  07/03/2012  4:56 PM Jenell Milliner, MD [id number]

## 2012-12-26 ENCOUNTER — Other Ambulatory Visit (HOSPITAL_COMMUNITY): Payer: Self-pay | Admitting: Interventional Radiology

## 2012-12-26 DIAGNOSIS — C641 Malignant neoplasm of right kidney, except renal pelvis: Secondary | ICD-10-CM

## 2012-12-26 DIAGNOSIS — C642 Malignant neoplasm of left kidney, except renal pelvis: Secondary | ICD-10-CM

## 2012-12-27 ENCOUNTER — Other Ambulatory Visit: Payer: Self-pay | Admitting: Nurse Practitioner

## 2013-01-03 ENCOUNTER — Ambulatory Visit
Admission: RE | Admit: 2013-01-03 | Discharge: 2013-01-03 | Disposition: A | Discharge status: Home / Self Care | Payer: 59 | Source: Ambulatory Visit | Attending: Interventional Radiology | Admitting: Interventional Radiology

## 2013-01-03 ENCOUNTER — Ambulatory Visit (HOSPITAL_COMMUNITY)
Admission: RE | Admit: 2013-01-03 | Discharge: 2013-01-03 | Disposition: A | Discharge status: Home / Self Care | Payer: 59 | Source: Ambulatory Visit | Attending: Interventional Radiology | Admitting: Interventional Radiology

## 2013-01-03 DIAGNOSIS — C641 Malignant neoplasm of right kidney, except renal pelvis: Secondary | ICD-10-CM

## 2013-01-03 DIAGNOSIS — Z9089 Acquired absence of other organs: Secondary | ICD-10-CM | POA: Insufficient documentation

## 2013-01-03 DIAGNOSIS — R161 Splenomegaly, not elsewhere classified: Secondary | ICD-10-CM | POA: Insufficient documentation

## 2013-01-03 DIAGNOSIS — C642 Malignant neoplasm of left kidney, except renal pelvis: Secondary | ICD-10-CM

## 2013-01-03 DIAGNOSIS — K8689 Other specified diseases of pancreas: Secondary | ICD-10-CM | POA: Insufficient documentation

## 2013-01-03 DIAGNOSIS — C649 Malignant neoplasm of unspecified kidney, except renal pelvis: Secondary | ICD-10-CM | POA: Insufficient documentation

## 2013-01-03 DIAGNOSIS — R944 Abnormal results of kidney function studies: Secondary | ICD-10-CM | POA: Insufficient documentation

## 2013-01-03 NOTE — Progress Notes (Signed)
NO SYMPTOMS OR COMPLAINTS PT TO SEE DR RONALD CHEN AT [facility] TO F/U PROSTATE CA

## 2013-04-10 ENCOUNTER — Encounter: Payer: Self-pay | Admitting: Cardiology

## 2013-04-18 ENCOUNTER — Encounter: Payer: 59 | Admitting: Cardiovascular Disease

## 2013-05-09 ENCOUNTER — Ambulatory Visit (HOSPITAL_COMMUNITY)
Admission: RE | Admit: 2013-05-09 | Discharge: 2013-05-09 | Disposition: A | Discharge status: Home / Self Care | Payer: 59 | Source: Ambulatory Visit | Attending: Nephrology | Admitting: Nephrology

## 2013-05-09 ENCOUNTER — Other Ambulatory Visit (HOSPITAL_COMMUNITY): Payer: Self-pay | Admitting: Nephrology

## 2013-05-09 DIAGNOSIS — Y849 Medical procedure, unspecified as the cause of abnormal reaction of the patient, or of later complication, without mention of misadventure at the time of the procedure: Secondary | ICD-10-CM | POA: Insufficient documentation

## 2013-05-09 DIAGNOSIS — N186 End stage renal disease: Secondary | ICD-10-CM | POA: Insufficient documentation

## 2013-05-09 DIAGNOSIS — T82898A Other specified complication of vascular prosthetic devices, implants and grafts, initial encounter: Secondary | ICD-10-CM | POA: Insufficient documentation

## 2013-05-09 MED ORDER — IOHEXOL 300 MG/ML  SOLN
100.0000 mL | Freq: Once | INTRAMUSCULAR | Status: AC | PRN
  Administered 2013-05-09: 50 mL via INTRAVENOUS

## 2013-05-09 NOTE — Procedures (Signed)
Procedure:  Left AV fistulogram Findings:  Normally patent radiocephalic fistula and central veins.

## 2013-05-10 ENCOUNTER — Telehealth (HOSPITAL_COMMUNITY): Payer: Self-pay | Admitting: Radiology

## 2013-05-21 ENCOUNTER — Encounter: Payer: 59 | Admitting: Cardiovascular Disease

## 2013-06-06 IMAGING — CR DG CHEST 1V PORT
1 series · 1 of 1 positions shown · non-contrast
Comparison: 09/29/2011

CLINICAL DATA: Coronary artery disease

PORTABLE CHEST - 1 VIEW

[AP]
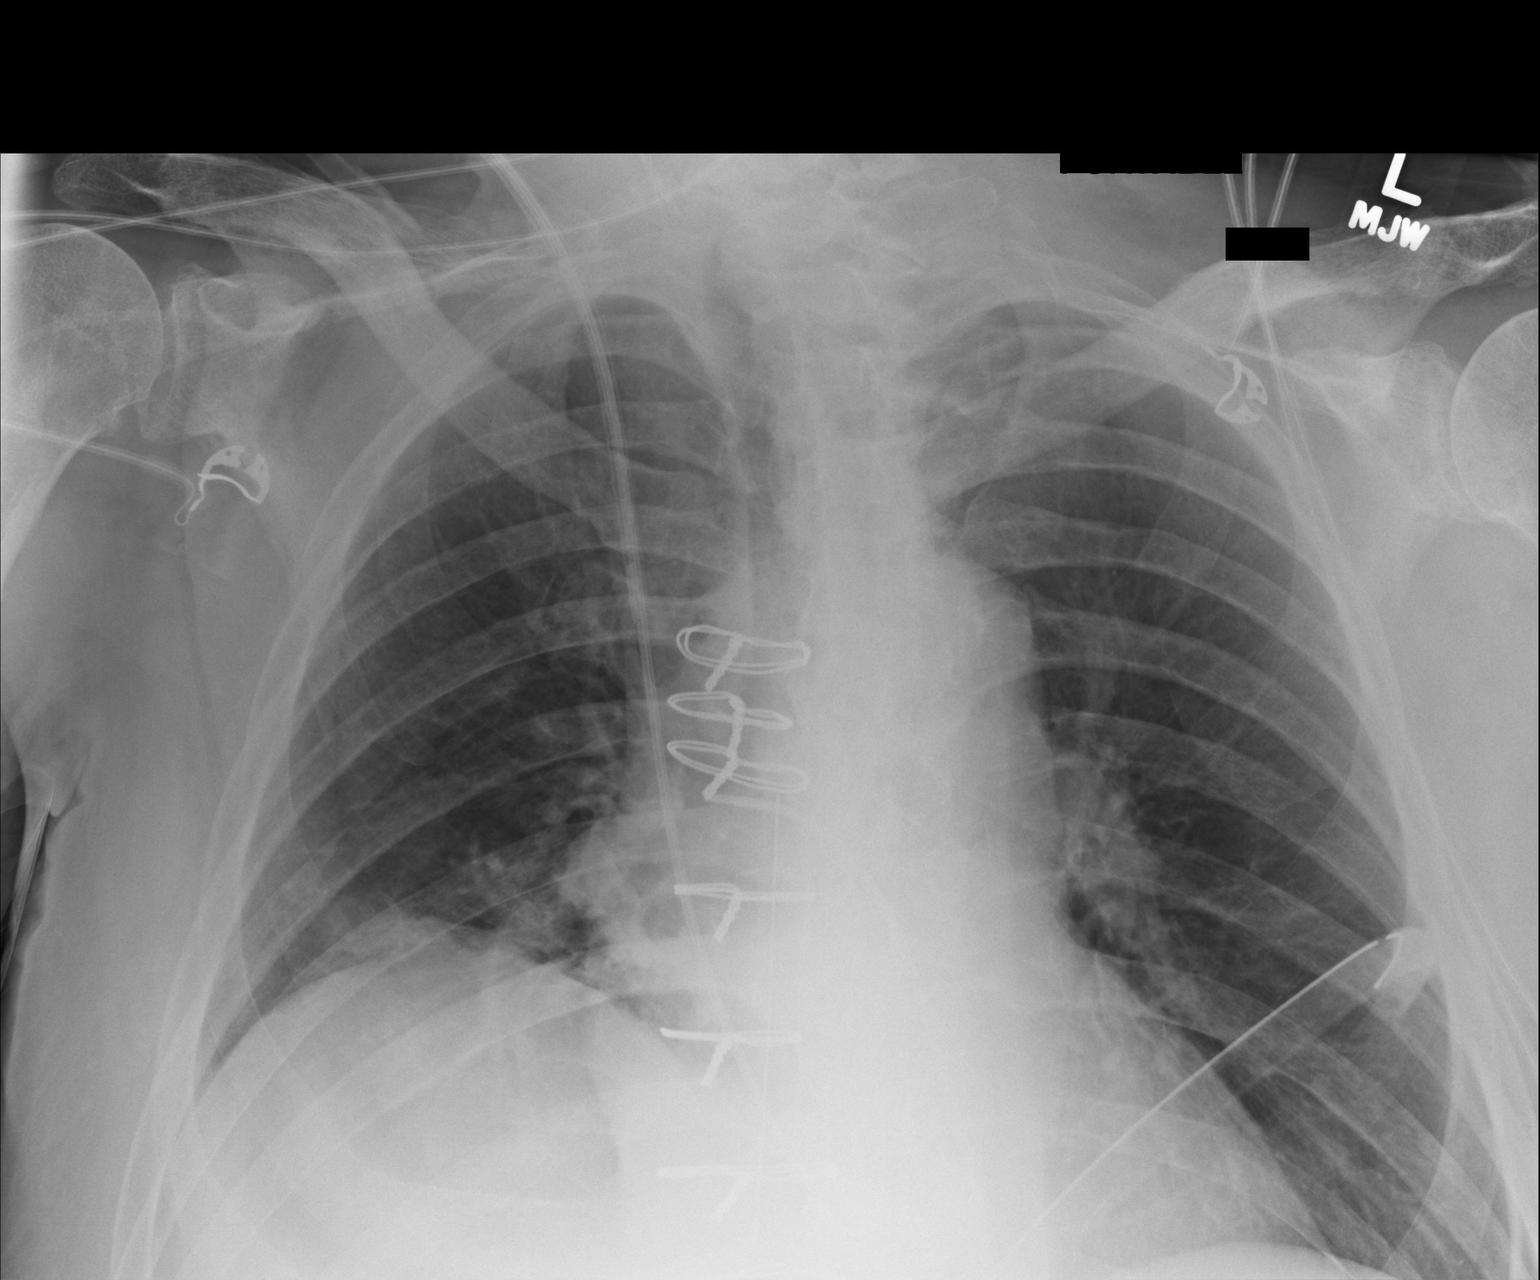

[1 of 1 positions shown; findings below may reference images not displayed]

FINDINGS: Endotracheal tube has been removed.  Left chest tube
remains in place.  No pneumothorax.  Swan-Ganz catheter tip in the
right pulmonary artery.  Mediastinal drain remains in place.

Elevated right hemidiaphragm and right lower lobe atelectasis
unchanged.

Negative for heart failure
IMPRESSION: Endotracheal tube removed.  No change in right lower lobe
atelectasis.  No pneumothorax.

## 2013-06-07 IMAGING — CR DG CHEST 1V PORT
1 series · 1 of 1 positions shown · non-contrast
Comparison: 09/30/2011

CLINICAL DATA: Postop CABG

PORTABLE CHEST - 1 VIEW

[AP]
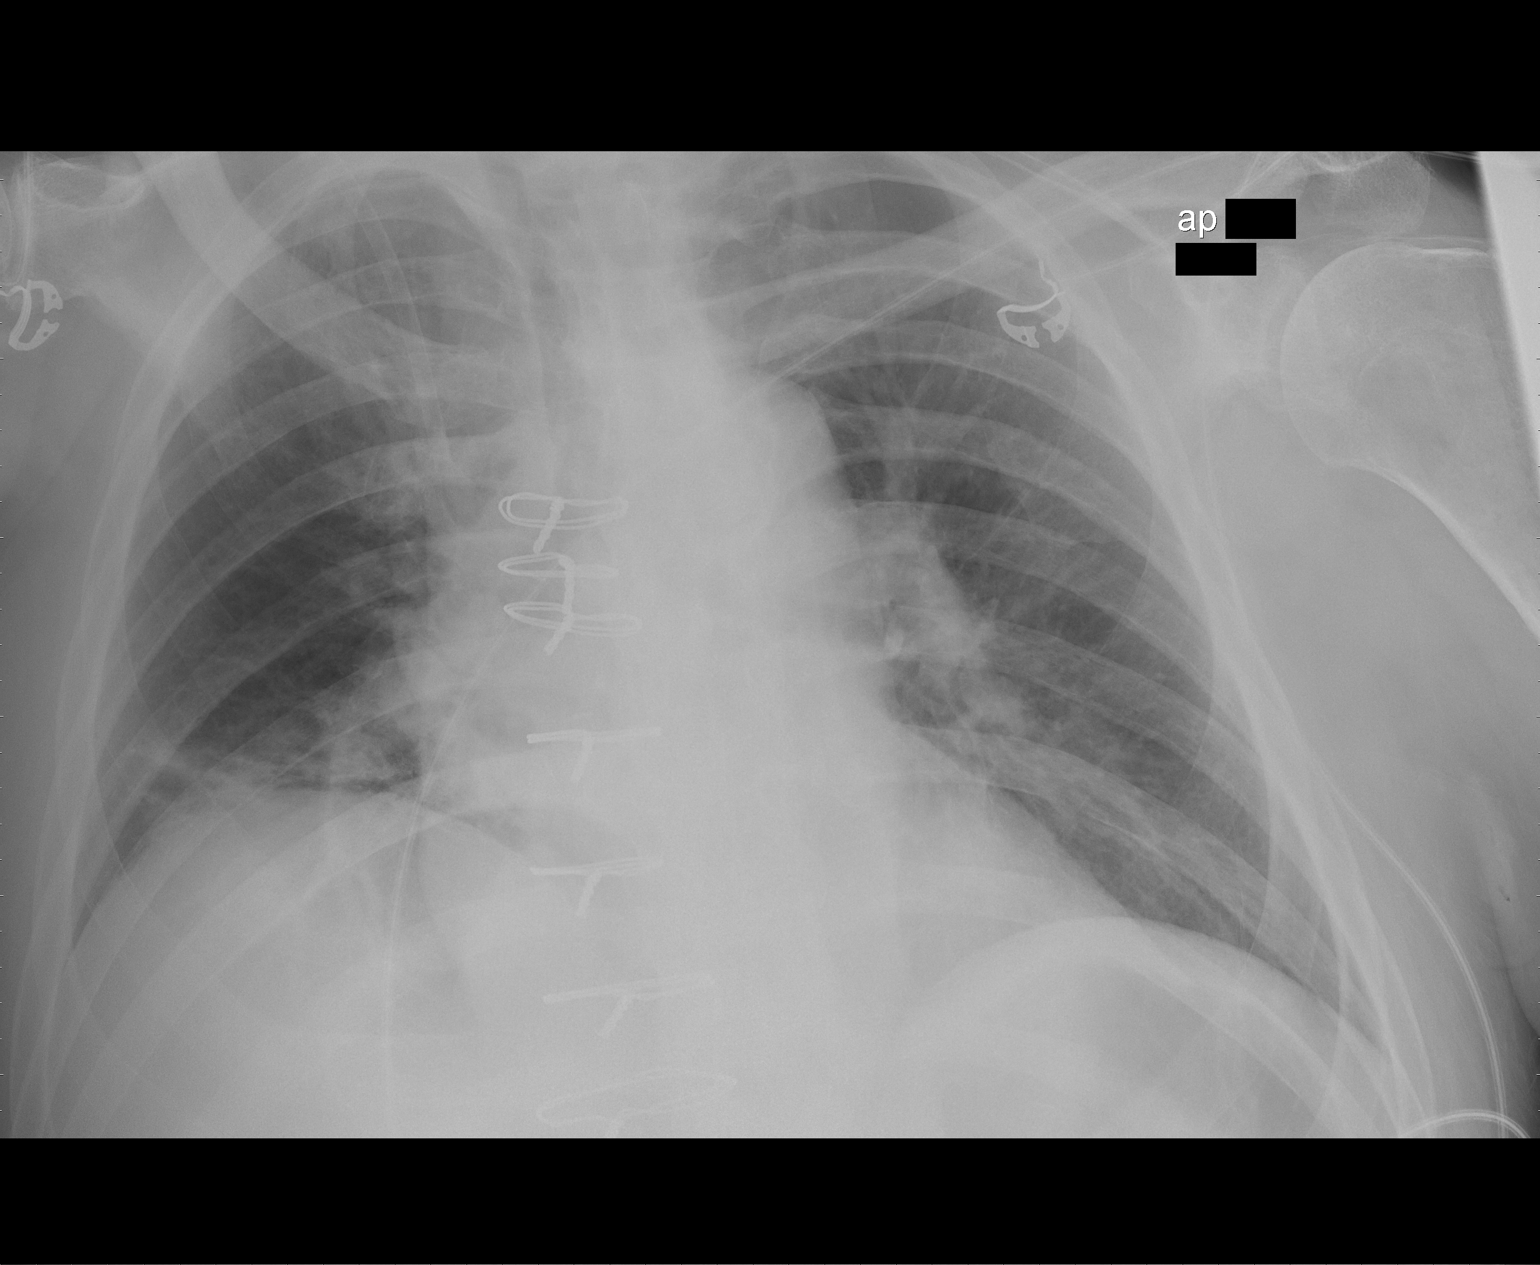

[1 of 1 positions shown; findings below may reference images not displayed]

FINDINGS: Interval removal of left chest tube and mediastinal
drain.  No pneumothorax is seen.

Volume loss with right basilar opacity, likely atelectasis.

Cardiomegaly with pulmonary vascular congestion.  No frank
interstitial edema.

Interval removal of right IJ Swan-Ganz catheter.  Stable right IJ
venous sheath.
IMPRESSION: Interval removal of left chest tube and mediastinal drain.  No
pneumothorax is seen.

## 2013-06-08 IMAGING — CR DG CHEST 2V
3 series · 3 of 3 positions shown · non-contrast
Comparison: Chest x-ray is 10/01/2011.

CLINICAL DATA: Status post CABG.

CHEST - 2 VIEW

[x chest ap]
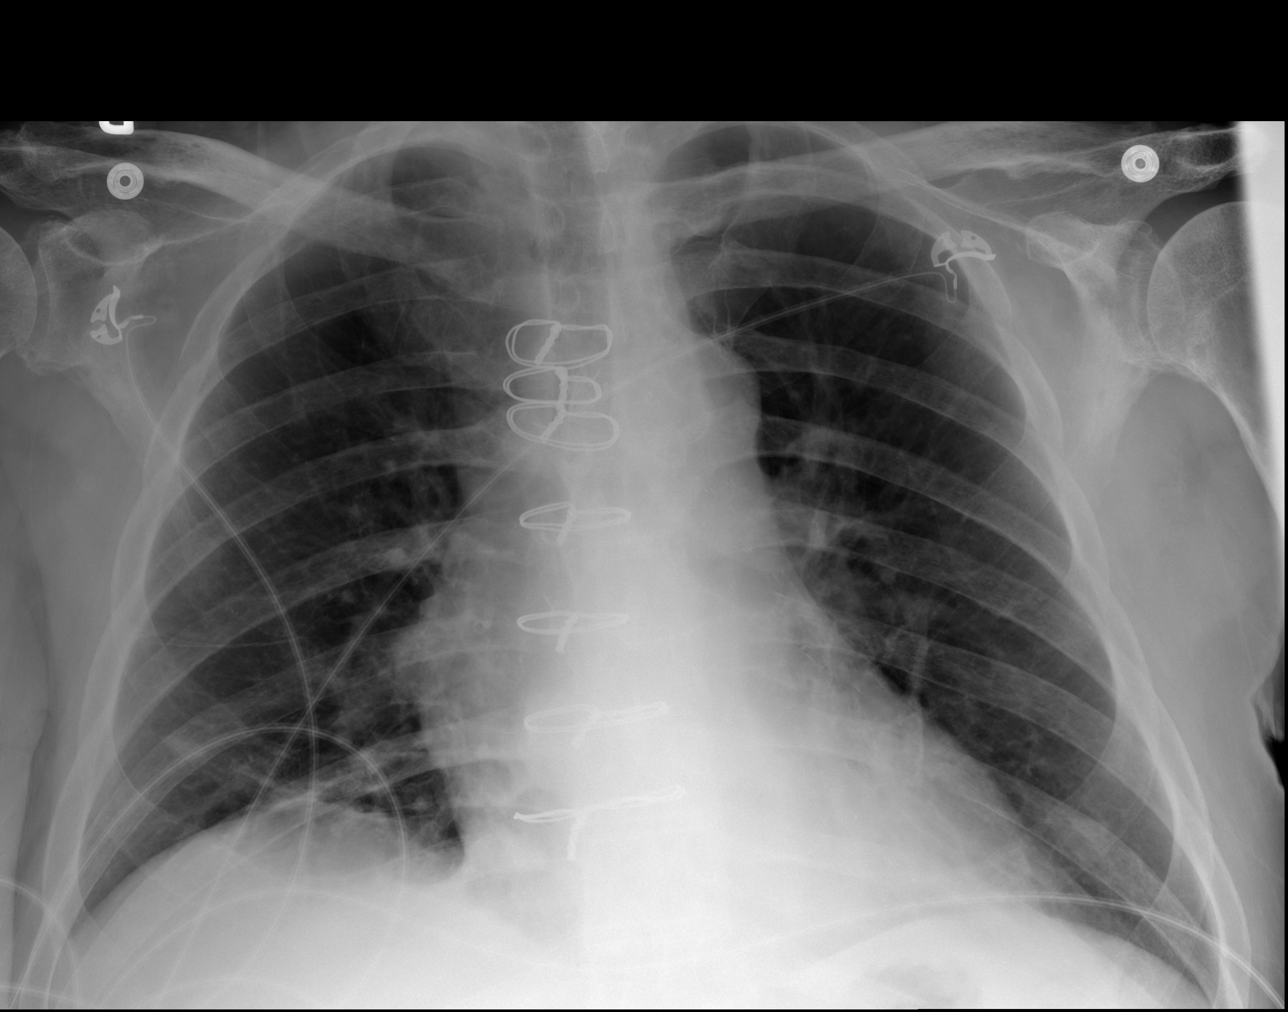

[w chest lat (1 of 2)]
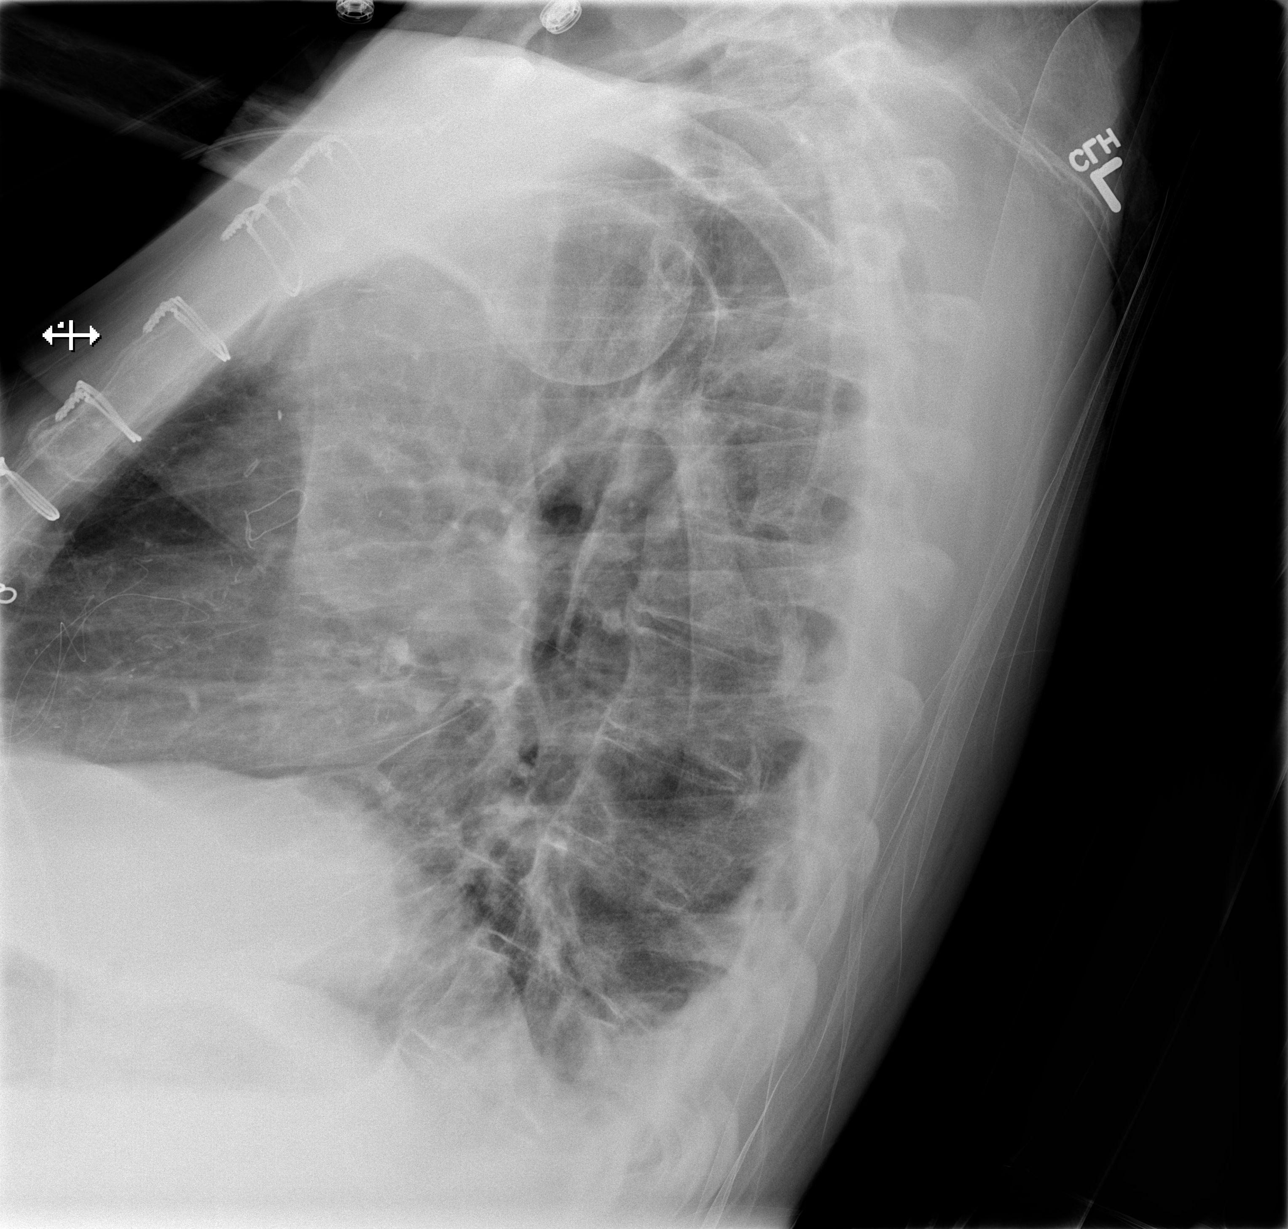

[w chest lat (2 of 2)]
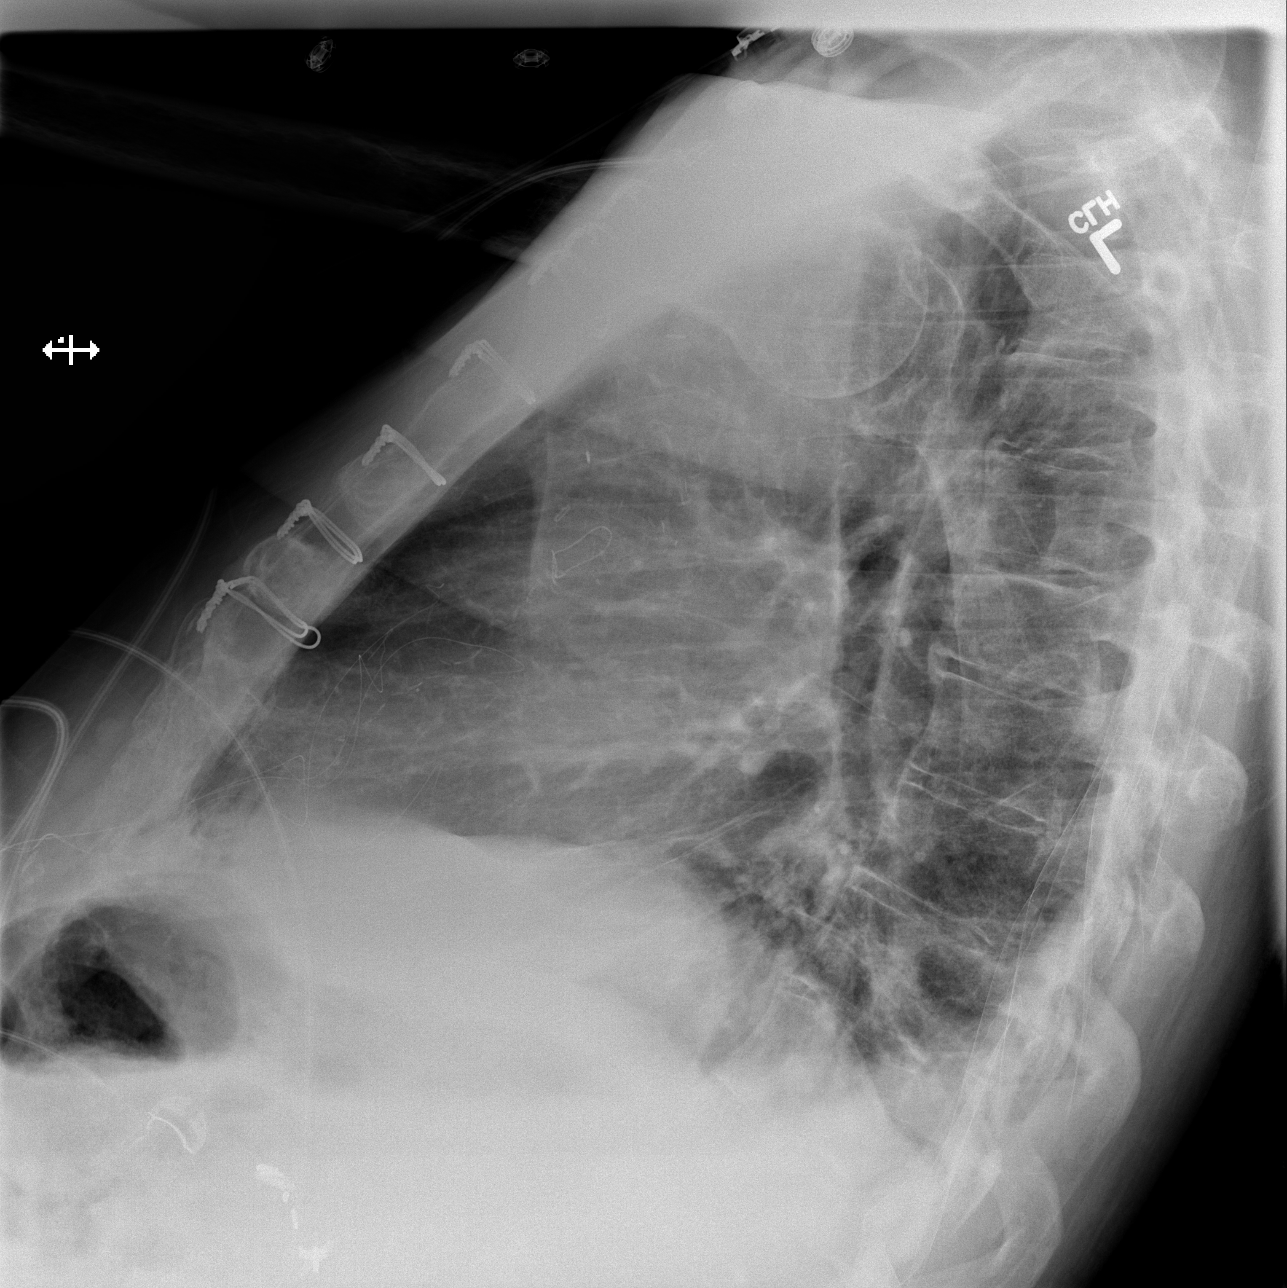

[3 of 3 positions shown; findings below may reference images not displayed]

FINDINGS: Lung volumes have slightly improved.  There are resolving
bibasilar opacities, favored to represent resolving areas of
subsegmental atelectasis.  No definite pleural effusions.  No
pneumothorax.  Heart size is upper limits of normal. The patient is
rotated to the right on today's exam, resulting in distortion of
the mediastinal contours and reduced diagnostic sensitivity and
specificity for mediastinal pathology.  Atherosclerosis in the
thoracic aorta.  Status post median sternotomy.  Previously noted
right IJ catheter has been removed.
IMPRESSION: 1.  Improving lung volumes with improving bibasilar aeration,
likely represent resolving postoperative subsegmental atelectasis.
2.  Postoperative changes and support apparatus, as above.
3.  Atherosclerosis.

## 2013-06-18 ENCOUNTER — Encounter: Payer: Self-pay | Admitting: Gastroenterology

## 2013-06-19 ENCOUNTER — Encounter: Payer: Self-pay | Admitting: Cardiovascular Disease

## 2013-06-19 ENCOUNTER — Ambulatory Visit (INDEPENDENT_AMBULATORY_CARE_PROVIDER_SITE_OTHER): Payer: 59 | Admitting: Cardiovascular Disease

## 2013-06-19 VITALS — BP 96/60 | HR 63 | Ht 76.0 in | Wt 222.1 lb

## 2013-06-19 DIAGNOSIS — I953 Hypotension of hemodialysis: Secondary | ICD-10-CM | POA: Insufficient documentation

## 2013-06-19 DIAGNOSIS — I2510 Atherosclerotic heart disease of native coronary artery without angina pectoris: Secondary | ICD-10-CM

## 2013-06-19 DIAGNOSIS — I959 Hypotension, unspecified: Secondary | ICD-10-CM

## 2013-06-19 MED ORDER — MIDODRINE HCL 5 MG PO TABS: ORAL_TABLET | ORAL | Status: DC

## 2013-06-19 NOTE — Patient Instructions (Signed)
Your physician has recommended you make the following change in your medication:  INCREASE MIDODRINE TO 2 TABLETS (10 MG ) ON DIALYSIS DAYS AND 1 TABLET DAILY ON THE OTHER DAYS.  Your physician has requested that you have an echocardiogram. Echocardiography is a painless test that uses sound waves to create images of your heart. It provides your doctor with information about the size and shape of your heart and how well your heart's chambers and valves are working. This procedure takes approximately one hour. There are no restrictions for this procedure.  Your physician wants you to follow-up in:1 YEAR  You will receive a reminder letter in the mail two months in advance. If you don't receive a letter, please call our office to schedule the follow-up appointment.

## 2013-06-19 NOTE — Progress Notes (Signed)
Bradly Bienenstock Nell Date of Birth  November 08, 1950       [facility]    [employer] [address], [place], [place]  [address]   [place],   [address] [place]   Fax  [phone]     Fax [phone]  Problem List: 1. Coronary artery disease, status post coronary artery bypass graft with LIMA to the LAD and saphenous vein graft to the OM 2. Hyperlipidemia 3. Hypertension 4. End-stage renal disease-currently on dialysis since 2012 5. Type 2 diabetes mellitus 6. Renal cell carcinoma- bilateral , s/p ablation.  7. Prostate cancer   History of Present Illness:  [name] is a 63 yo - former patient of Dr. Verl Blalock.  He is here to discuss his hypotension during dialysis.   He used to take Midodrine 5 BID on dialysis days - now he takes 5 mg BID every day.   Hx of CAD - s/p CABG.    His BP always runs low.  He is on the renal transplant list.  He needs to be free of cancer for > 2 years to be considered.     Current Outpatient Prescriptions  Medication Sig Dispense Refill  . aspirin EC 81 MG tablet Take 81 mg by mouth every other day.       [name] atorvastatin (LIPITOR) 10 MG tablet TAKE 1 TABLET EVERY DAY  30 tablet  9  . b complex-vitamin c-folic acid (NEPHRO-VITE) 0.8 MG TABS Take 0.8 mg by mouth at bedtime.      . calcium acetate (PHOSLO) 667 MG capsule Take 2,001 mg by mouth 3 (three) times daily with meals.       . darbepoetin (ARANESP) 150 MCG/0.3ML SOLN Inject 0.3 mLs (150 mcg total) into the vein every Friday with hemodialysis.  1.68 mL    . iron dextran complex (INFED) 50 MG/ML injection Inject 1 mL (50 mg total) into the vein every Wednesday with hemodialysis.  2 mL    . midodrine (PROAMATINE) 5 MG tablet TAKE 1 TABLET TWICE A DAY      . sodium chloride 0.9 % SOLN 100 mL with iron dextran complex 50 MG/ML SOLN 50 mg Inject 50 mg into the vein every 7 (seven) days.       No current facility-administered  medications for this visit.      Allergies  Allergen Reactions  . Codeine Other (See Comments)    Makes patient incoherent. STATES MAKES HIM COMATOSE  . Tape Other (See Comments)    Plastic tape tears skin off, please use paper tape instead.    Past Medical History  Diagnosis Date  . CAD, NATIVE VESSEL 01/08/2008    a. Myoview 08/30/11 at [facility]: No scar, + mild apical, apical lateral, mid anterolateral ischemia, EF 49% => s/p CABG (L-LAD, S-OM)  . HYPERLIPIDEMIA-MIXED 01/08/2008  . OVERWEIGHT/OBESITY 01/08/2008  . HYPERTENSION 01/27/2009  . Anemia   . Blood transfusion ~ 2003  . Bruises easily   . Blind     both eyes removed   . Family history of breast cancer     mother  . Cellulitis late 1980's    "hospitalized; wrapped both legs; several times; no OR for this"  . Peripheral vascular disease   . ESRD (end stage renal disease) on dialysis     09/27/11 [facility]; Mon, Wed, Fri  . Pneumonia 2000;s  . DM type 2 (diabetes mellitus, type  2)   . Renal cell carcinoma     "both kidneys"  . H/O prostate cancer     seeds implant 03/2012  . Irregular heartbeat   . Colon polyps   . Dialysis patient     M-W-F  . Hypertension due to AV shunt     left forearm    Past Surgical History  Procedure Laterality Date  . Venectomy  1980's    "knees down; both legs; 2 separate times  . Foot amputation  ~ 2002    right;  partial; "infection"  . Cholecystectomy  1992  . Colonoscopy  06/14/2011    Procedure: COLONOSCOPY;  Surgeon: [name], MD;  Location: [facility];  Service: Endoscopy;  Laterality: N/A;  . Hot hemostasis  06/14/2011    Procedure: HOT HEMOSTASIS (ARGON PLASMA COAGULATION/BICAP);  Surgeon: [name], MD;  Location: [facility];  Service: Endoscopy;  Laterality: N/A;  . Av fistula placement  02/2010    LFA  . Varicose vein surgery  mid 1980's    BLE  . Radiofrequency ablation kidney  2010-2012    "twice; one on each side; for cancer"  . Enucleation   2003; 2006    bilateral; "diabetes; pain"  . Cardiac catheterization  ~ 2001  . Coronary artery bypass graft  09/29/2011    Procedure: CORONARY ARTERY BYPASS GRAFTING (CABG);  Surgeon: [name], MD;  Location: [place];  Service: Open Heart Surgery;  Laterality: N/A;  Coronary Artery Bypass Graft times two utilizing the left internal mammary artery and the right greater saphenous vein harvested endoscopically.  . Colonoscopy N/A 07/24/2012    Procedure: COLONOSCOPY;  Surgeon: [name], MD;  Location: [facility];  Service: Endoscopy;  Laterality: N/A;    History  Smoking status  . Never Smoker   Smokeless tobacco  . Never Used    History  Alcohol Use No    Family History  Problem Relation Age of Onset  . Cancer    . Coronary artery disease    . Kidney disease    . Breast cancer    . Ovarian cancer    . Lung cancer    . Diabetes    . Cancer Mother 19    breast, spine and ovarian  . Emphysema Father   . Cancer Father 58    kidney, prostate  . Cancer Sister     ovarian  . Cancer Brother     lung  . Malignant hyperthermia Neg Hx     Reviw of Systems:  Reviewed in the HPI.  All other systems are negative.  Physical Exam: Blood pressure 96/60, pulse 63, height 6\' 4"  (1.93 m), weight 222 lb 1.9 oz (100.753 kg). Wt Readings from Last 3 Encounters:  06/19/13 222 lb 1.9 oz (100.753 kg)  07/10/12 218 lb (98.884 kg)  07/03/12 212 lb (96.163 kg)     General: Well developed, well nourished, in no acute distress.  Head: the patient is blind   Neck: Supple. Carotids are 2 + without bruits. No JVD   Lungs: Clear   Heart: RR, very distant heart sounds  Abdomen: Soft, non-tender, non-distended with normal bowel sounds.  Msk:  Strength and tone are normal   Extremities: No clubbing or cyanosis. No edema.  Distal pedal pulses are 2+ and equal  dislysis shunt in left forearm  Neuro: CN II - XII intact.  Alert and oriented X 3.   Psych:  Normal    ECG: June 19, 2013.  NSR at 62.  Sinus arrhythmia  Assessment / Plan:

## 2013-06-19 NOTE — Assessment & Plan Note (Signed)
[name] continues to have problems with hypotension during dialysis. He's had this for the past year or so. He's been on midodrine which has helped.  They've increased his dry weight so which is held.  I would like to increase the midodrine 210 mg in the morning with 5 mg in the evening on dialysis days. On the non-dialysis days he'll continue with her 5 mg twice a day. We'll get an echocardiogram to make sure that his cardiac function is normal.  Otherwise I will  defer to his nephrologist for further management of his blood pressure issues. I'll see him again in one year for followup visit.

## 2013-07-09 ENCOUNTER — Ambulatory Visit (HOSPITAL_COMMUNITY): Payer: Medicare Other | Attending: Cardiovascular Disease | Admitting: Cardiology

## 2013-07-09 ENCOUNTER — Encounter: Payer: Self-pay | Admitting: Cardiovascular Disease

## 2013-07-09 DIAGNOSIS — I2510 Atherosclerotic heart disease of native coronary artery without angina pectoris: Secondary | ICD-10-CM | POA: Diagnosis present

## 2013-07-09 DIAGNOSIS — I959 Hypotension, unspecified: Secondary | ICD-10-CM | POA: Diagnosis not present

## 2013-07-09 NOTE — Progress Notes (Signed)
Echo performed. 

## 2013-07-24 ENCOUNTER — Telehealth: Payer: Self-pay | Admitting: *Deleted

## 2013-08-10 ENCOUNTER — Other Ambulatory Visit: Payer: Self-pay | Admitting: Cardiology

## 2013-08-12 ENCOUNTER — Inpatient Hospital Stay
Admission: RE | Admit: 2013-08-12 | Discharge: 2013-08-12 | Disposition: A | Discharge status: Home / Self Care | Payer: Self-pay | Source: Ambulatory Visit | Attending: Interventional Radiology | Admitting: Interventional Radiology

## 2013-08-12 ENCOUNTER — Other Ambulatory Visit (HOSPITAL_COMMUNITY): Payer: Self-pay | Admitting: Interventional Radiology

## 2013-08-12 DIAGNOSIS — N2889 Other specified disorders of kidney and ureter: Secondary | ICD-10-CM

## 2013-08-16 NOTE — Telephone Encounter (Signed)
Called to inform patient that colonoscopy is due for [facility]  L/M for patient to return call

## 2013-09-18 NOTE — Telephone Encounter (Signed)
Gave recall paper back to Jess today  Have not been able to get in touch with patient

## 2013-11-19 ENCOUNTER — Encounter: Payer: Self-pay | Admitting: Gastroenterology

## 2013-12-16 ENCOUNTER — Telehealth: Payer: Self-pay | Admitting: *Deleted

## 2013-12-19 ENCOUNTER — Other Ambulatory Visit: Payer: Self-pay

## 2013-12-19 DIAGNOSIS — Z86010 Personal history of colonic polyps: Secondary | ICD-10-CM

## 2013-12-19 NOTE — Telephone Encounter (Signed)
Spoke with the patient and his care partner. Advised of Colonoscopy scheduled for 01/07/14. Patient asks to not come in for the pre-visit, stating he is comfortable with colon prep and has done this many times. Instructions mailed. Care partner will call if there are questions.

## 2013-12-24 ENCOUNTER — Encounter (HOSPITAL_COMMUNITY): Payer: Self-pay | Admitting: Pharmacy Technician

## 2013-12-25 ENCOUNTER — Encounter: Payer: Self-pay | Admitting: Gastroenterology

## 2013-12-30 ENCOUNTER — Encounter (HOSPITAL_COMMUNITY): Payer: Self-pay | Admitting: Emergency Medicine

## 2013-12-30 ENCOUNTER — Emergency Department (HOSPITAL_COMMUNITY): Payer: Medicare Other

## 2013-12-30 ENCOUNTER — Inpatient Hospital Stay (HOSPITAL_COMMUNITY)
Admission: EM | Admit: 2013-12-30 | Discharge: 2014-01-04 | DRG: 480 | Disposition: A | Discharge status: Skilled Nursing Facility | Payer: Medicare Other | Attending: Internal Medicine | Admitting: Internal Medicine

## 2013-12-30 DIAGNOSIS — E1140 Type 2 diabetes mellitus with diabetic neuropathy, unspecified: Secondary | ICD-10-CM | POA: Diagnosis present

## 2013-12-30 DIAGNOSIS — Z8051 Family history of malignant neoplasm of kidney: Secondary | ICD-10-CM | POA: Diagnosis not present

## 2013-12-30 DIAGNOSIS — W19XXXA Unspecified fall, initial encounter: Secondary | ICD-10-CM

## 2013-12-30 DIAGNOSIS — H543 Unqualified visual loss, both eyes: Secondary | ICD-10-CM | POA: Diagnosis not present

## 2013-12-30 DIAGNOSIS — Z992 Dependence on renal dialysis: Secondary | ICD-10-CM

## 2013-12-30 DIAGNOSIS — K219 Gastro-esophageal reflux disease without esophagitis: Secondary | ICD-10-CM | POA: Diagnosis present

## 2013-12-30 DIAGNOSIS — Z825 Family history of asthma and other chronic lower respiratory diseases: Secondary | ICD-10-CM

## 2013-12-30 DIAGNOSIS — E1121 Type 2 diabetes mellitus with diabetic nephropathy: Secondary | ICD-10-CM | POA: Diagnosis present

## 2013-12-30 DIAGNOSIS — E11319 Type 2 diabetes mellitus with unspecified diabetic retinopathy without macular edema: Secondary | ICD-10-CM | POA: Diagnosis present

## 2013-12-30 DIAGNOSIS — W1809XA Striking against other object with subsequent fall, initial encounter: Secondary | ICD-10-CM | POA: Diagnosis present

## 2013-12-30 DIAGNOSIS — Z8249 Family history of ischemic heart disease and other diseases of the circulatory system: Secondary | ICD-10-CM

## 2013-12-30 DIAGNOSIS — Z951 Presence of aortocoronary bypass graft: Secondary | ICD-10-CM | POA: Diagnosis not present

## 2013-12-30 DIAGNOSIS — I953 Hypotension of hemodialysis: Secondary | ICD-10-CM | POA: Diagnosis present

## 2013-12-30 DIAGNOSIS — I10 Essential (primary) hypertension: Secondary | ICD-10-CM | POA: Diagnosis present

## 2013-12-30 DIAGNOSIS — S72009A Fracture of unspecified part of neck of unspecified femur, initial encounter for closed fracture: Secondary | ICD-10-CM | POA: Diagnosis present

## 2013-12-30 DIAGNOSIS — Z794 Long term (current) use of insulin: Secondary | ICD-10-CM | POA: Diagnosis not present

## 2013-12-30 DIAGNOSIS — Y841 Kidney dialysis as the cause of abnormal reaction of the patient, or of later complication, without mention of misadventure at the time of the procedure: Secondary | ICD-10-CM | POA: Diagnosis not present

## 2013-12-30 DIAGNOSIS — M533 Sacrococcygeal disorders, not elsewhere classified: Secondary | ICD-10-CM | POA: Diagnosis present

## 2013-12-30 DIAGNOSIS — Z801 Family history of malignant neoplasm of trachea, bronchus and lung: Secondary | ICD-10-CM | POA: Diagnosis not present

## 2013-12-30 DIAGNOSIS — S72143A Displaced intertrochanteric fracture of unspecified femur, initial encounter for closed fracture: Secondary | ICD-10-CM | POA: Diagnosis not present

## 2013-12-30 DIAGNOSIS — N186 End stage renal disease: Secondary | ICD-10-CM

## 2013-12-30 DIAGNOSIS — S98919A Complete traumatic amputation of unspecified foot, level unspecified, initial encounter: Secondary | ICD-10-CM | POA: Diagnosis not present

## 2013-12-30 DIAGNOSIS — Z89431 Acquired absence of right foot: Secondary | ICD-10-CM

## 2013-12-30 DIAGNOSIS — M25552 Pain in left hip: Secondary | ICD-10-CM | POA: Diagnosis present

## 2013-12-30 DIAGNOSIS — E1139 Type 2 diabetes mellitus with other diabetic ophthalmic complication: Secondary | ICD-10-CM | POA: Diagnosis not present

## 2013-12-30 DIAGNOSIS — N058 Unspecified nephritic syndrome with other morphologic changes: Secondary | ICD-10-CM | POA: Diagnosis not present

## 2013-12-30 DIAGNOSIS — M810 Age-related osteoporosis without current pathological fracture: Secondary | ICD-10-CM | POA: Diagnosis present

## 2013-12-30 DIAGNOSIS — E785 Hyperlipidemia, unspecified: Secondary | ICD-10-CM | POA: Diagnosis not present

## 2013-12-30 DIAGNOSIS — Y92009 Unspecified place in unspecified non-institutional (private) residence as the place of occurrence of the external cause: Secondary | ICD-10-CM | POA: Diagnosis not present

## 2013-12-30 DIAGNOSIS — S72142A Displaced intertrochanteric fracture of left femur, initial encounter for closed fracture: Secondary | ICD-10-CM | POA: Diagnosis present

## 2013-12-30 DIAGNOSIS — E1142 Type 2 diabetes mellitus with diabetic polyneuropathy: Secondary | ICD-10-CM | POA: Diagnosis not present

## 2013-12-30 DIAGNOSIS — R066 Hiccough: Secondary | ICD-10-CM | POA: Diagnosis not present

## 2013-12-30 DIAGNOSIS — E118 Type 2 diabetes mellitus with unspecified complications: Secondary | ICD-10-CM | POA: Diagnosis present

## 2013-12-30 DIAGNOSIS — Z833 Family history of diabetes mellitus: Secondary | ICD-10-CM | POA: Diagnosis not present

## 2013-12-30 DIAGNOSIS — I2510 Atherosclerotic heart disease of native coronary artery without angina pectoris: Secondary | ICD-10-CM | POA: Diagnosis present

## 2013-12-30 DIAGNOSIS — E1129 Type 2 diabetes mellitus with other diabetic kidney complication: Secondary | ICD-10-CM | POA: Diagnosis not present

## 2013-12-30 DIAGNOSIS — Z85528 Personal history of other malignant neoplasm of kidney: Secondary | ICD-10-CM | POA: Diagnosis not present

## 2013-12-30 DIAGNOSIS — D631 Anemia in chronic kidney disease: Secondary | ICD-10-CM | POA: Diagnosis present

## 2013-12-30 DIAGNOSIS — Z79899 Other long term (current) drug therapy: Secondary | ICD-10-CM

## 2013-12-30 DIAGNOSIS — Z8546 Personal history of malignant neoplasm of prostate: Secondary | ICD-10-CM | POA: Diagnosis not present

## 2013-12-30 DIAGNOSIS — R0902 Hypoxemia: Secondary | ICD-10-CM | POA: Diagnosis not present

## 2013-12-30 DIAGNOSIS — I739 Peripheral vascular disease, unspecified: Secondary | ICD-10-CM | POA: Diagnosis not present

## 2013-12-30 DIAGNOSIS — N189 Chronic kidney disease, unspecified: Secondary | ICD-10-CM

## 2013-12-30 DIAGNOSIS — I120 Hypertensive chronic kidney disease with stage 5 chronic kidney disease or end stage renal disease: Secondary | ICD-10-CM | POA: Diagnosis present

## 2013-12-30 DIAGNOSIS — S72002A Fracture of unspecified part of neck of left femur, initial encounter for closed fracture: Secondary | ICD-10-CM

## 2013-12-30 DIAGNOSIS — D696 Thrombocytopenia, unspecified: Secondary | ICD-10-CM | POA: Diagnosis present

## 2013-12-30 DIAGNOSIS — Y92019 Unspecified place in single-family (private) house as the place of occurrence of the external cause: Secondary | ICD-10-CM

## 2013-12-30 DIAGNOSIS — H540 Blindness, both eyes: Secondary | ICD-10-CM | POA: Diagnosis present

## 2013-12-30 DIAGNOSIS — Z803 Family history of malignant neoplasm of breast: Secondary | ICD-10-CM | POA: Diagnosis not present

## 2013-12-30 DIAGNOSIS — M25559 Pain in unspecified hip: Secondary | ICD-10-CM | POA: Diagnosis present

## 2013-12-30 DIAGNOSIS — E1149 Type 2 diabetes mellitus with other diabetic neurological complication: Secondary | ICD-10-CM | POA: Diagnosis not present

## 2013-12-30 DIAGNOSIS — Z7982 Long term (current) use of aspirin: Secondary | ICD-10-CM

## 2013-12-30 LAB — GLUCOSE, CAPILLARY: GLUCOSE-CAPILLARY: 132 mg/dL — AB (ref 70–99)

## 2013-12-30 LAB — I-STAT TROPONIN, ED: Troponin i, poc: 0.01 ng/mL (ref 0.00–0.08)

## 2013-12-30 LAB — CBC WITH DIFFERENTIAL/PLATELET
BASOS ABS: 0 10*3/uL (ref 0.0–0.1)
Basophils Relative: 0 % (ref 0–1)
EOS ABS: 0.1 10*3/uL (ref 0.0–0.7)
Eosinophils Relative: 3 % (ref 0–5)
HCT: 34.3 % — ABNORMAL LOW (ref 39.0–52.0)
Hemoglobin: 11.5 g/dL — ABNORMAL LOW (ref 13.0–17.0)
Lymphocytes Relative: 10 % — ABNORMAL LOW (ref 12–46)
Lymphs Abs: 0.4 10*3/uL — ABNORMAL LOW (ref 0.7–4.0)
MCH: 33 pg (ref 26.0–34.0)
MCHC: 33.5 g/dL (ref 30.0–36.0)
MCV: 98.6 fL (ref 78.0–100.0)
Monocytes Absolute: 0.3 10*3/uL (ref 0.1–1.0)
Monocytes Relative: 6 % (ref 3–12)
NEUTROS ABS: 3.5 10*3/uL (ref 1.7–7.7)
Neutrophils Relative %: 81 % — ABNORMAL HIGH (ref 43–77)
Platelets: 108 10*3/uL — ABNORMAL LOW (ref 150–400)
RBC: 3.48 MIL/uL — ABNORMAL LOW (ref 4.22–5.81)
RDW: 13.7 % (ref 11.5–15.5)
WBC: 4.3 10*3/uL (ref 4.0–10.5)

## 2013-12-30 LAB — BASIC METABOLIC PANEL
ANION GAP: 11 (ref 5–15)
BUN: 38 mg/dL — ABNORMAL HIGH (ref 6–23)
CHLORIDE: 98 meq/L (ref 96–112)
CO2: 34 mEq/L — ABNORMAL HIGH (ref 19–32)
CREATININE: 4.08 mg/dL — AB (ref 0.50–1.35)
Calcium: 9.2 mg/dL (ref 8.4–10.5)
GFR calc non Af Amer: 14 mL/min — ABNORMAL LOW (ref >90)
GFR, EST AFRICAN AMERICAN: 16 mL/min — AB (ref >90)
Glucose, Bld: 122 mg/dL — ABNORMAL HIGH (ref 70–99)
POTASSIUM: 4.3 meq/L (ref 3.7–5.3)
Sodium: 143 mEq/L (ref 137–147)

## 2013-12-30 MED ORDER — SODIUM CHLORIDE 0.9 % IV SOLN: 250.0000 mL | INTRAVENOUS | Status: DC | PRN

## 2013-12-30 MED ORDER — DEXTROSE-NACL 5-0.45 % IV SOLN: 100.0000 mL/h | INTRAVENOUS | Status: DC

## 2013-12-30 MED ORDER — INFLUENZA VAC SPLIT QUAD 0.5 ML IM SUSY
0.5000 mL | PREFILLED_SYRINGE | INTRAMUSCULAR | Status: AC
  Administered 2014-01-01: 0.5 mL via INTRAMUSCULAR
  Filled 2013-12-30: qty 0.5

## 2013-12-30 MED ORDER — FENTANYL CITRATE 0.05 MG/ML IJ SOLN: 25.0000 ug | Freq: Once | INTRAMUSCULAR | Status: DC

## 2013-12-30 MED ORDER — ZOLPIDEM TARTRATE 5 MG PO TABS
5.0000 mg | ORAL_TABLET | Freq: Every evening | ORAL | Status: DC | PRN
Start: 2013-12-30 — End: 2014-01-04
  Administered 2013-12-30: 5 mg via ORAL
  Filled 2013-12-30: qty 1

## 2013-12-30 MED ORDER — DOCUSATE SODIUM 100 MG PO CAPS
100.0000 mg | ORAL_CAPSULE | Freq: Two times a day (BID) | ORAL | Status: DC
  Administered 2013-12-30 – 2014-01-03 (×6): 100 mg via ORAL
  Filled 2013-12-30 (×11): qty 1

## 2013-12-30 MED ORDER — FENTANYL CITRATE 0.05 MG/ML IJ SOLN
100.0000 ug | Freq: Once | INTRAMUSCULAR | Status: AC
  Administered 2013-12-30: 100 ug via INTRAVENOUS
  Filled 2013-12-30: qty 2

## 2013-12-30 MED ORDER — POLYETHYLENE GLYCOL 3350 17 G PO PACK
17.0000 g | PACK | Freq: Every day | ORAL | Status: DC | PRN
  Administered 2014-01-02: 17 g via ORAL
  Filled 2013-12-30: qty 1

## 2013-12-30 MED ORDER — SODIUM CHLORIDE 0.9 % IJ SOLN: 3.0000 mL | INTRAMUSCULAR | Status: DC | PRN

## 2013-12-30 MED ORDER — RENA-VITE PO TABS
1.0000 | ORAL_TABLET | Freq: Every day | ORAL | Status: DC
Start: 2013-12-30 — End: 2014-01-04
  Administered 2013-12-30 – 2014-01-03 (×5): 1 via ORAL
  Filled 2013-12-30 (×7): qty 1

## 2013-12-30 MED ORDER — ACETAMINOPHEN 325 MG PO TABS
650.0000 mg | ORAL_TABLET | Freq: Four times a day (QID) | ORAL | Status: DC | PRN
  Administered 2013-12-30: 650 mg via ORAL
  Filled 2013-12-30: qty 2

## 2013-12-30 MED ORDER — ACETAMINOPHEN 650 MG RE SUPP: 650.0000 mg | Freq: Four times a day (QID) | RECTAL | Status: DC | PRN

## 2013-12-30 MED ORDER — ACETAMINOPHEN 500 MG PO TABS: 1000.0000 mg | ORAL_TABLET | Freq: Once | ORAL | Status: DC

## 2013-12-30 MED ORDER — ONDANSETRON HCL 4 MG/2ML IJ SOLN: 4.0000 mg | Freq: Three times a day (TID) | INTRAMUSCULAR | Status: AC | PRN

## 2013-12-30 MED ORDER — INSULIN ASPART 100 UNIT/ML ~~LOC~~ SOLN
0.0000 [IU] | Freq: Three times a day (TID) | SUBCUTANEOUS | Status: DC
  Administered 2014-01-01 – 2014-01-02 (×3): 1 [IU] via SUBCUTANEOUS

## 2013-12-30 MED ORDER — MORPHINE SULFATE 2 MG/ML IJ SOLN
2.0000 mg | INTRAMUSCULAR | Status: DC | PRN
  Administered 2013-12-30: 2 mg via INTRAVENOUS
  Filled 2013-12-30: qty 1

## 2013-12-30 MED ORDER — MORPHINE SULFATE 2 MG/ML IJ SOLN
2.0000 mg | INTRAMUSCULAR | Status: DC | PRN
  Administered 2013-12-30 – 2014-01-01 (×9): 2 mg via INTRAVENOUS
  Filled 2013-12-30 (×8): qty 1

## 2013-12-30 MED ORDER — CALCIUM ACETATE 667 MG PO CAPS
2001.0000 mg | ORAL_CAPSULE | Freq: Three times a day (TID) | ORAL | Status: DC
  Administered 2014-01-01 – 2014-01-04 (×8): 2001 mg via ORAL
  Filled 2013-12-30 (×16): qty 3

## 2013-12-30 MED ORDER — CEFAZOLIN SODIUM-DEXTROSE 2-3 GM-% IV SOLR
2.0000 g | INTRAVENOUS | Status: AC
  Administered 2013-12-31: 2 g via INTRAVENOUS
  Filled 2013-12-30: qty 50

## 2013-12-30 MED ORDER — FENTANYL CITRATE 0.05 MG/ML IJ SOLN
12.5000 ug | Freq: Once | INTRAMUSCULAR | Status: AC
  Administered 2013-12-30: 12.5 ug via INTRAVENOUS
  Filled 2013-12-30: qty 2

## 2013-12-30 MED ORDER — SODIUM CHLORIDE 0.9 % IJ SOLN
3.0000 mL | Freq: Two times a day (BID) | INTRAMUSCULAR | Status: DC
  Administered 2013-12-30 – 2014-01-02 (×5): 3 mL via INTRAVENOUS

## 2013-12-30 MED ORDER — MORPHINE SULFATE 4 MG/ML IJ SOLN
4.0000 mg | Freq: Once | INTRAMUSCULAR | Status: AC
  Administered 2013-12-30: 4 mg via INTRAVENOUS
  Filled 2013-12-30: qty 1

## 2013-12-30 NOTE — ED Notes (Signed)
[facility] presents with a 63 yo male coming from a dialysis center with a fall after treatment.  Pt had just complted dialysis treatment, went to stand up and fell.  No LOC, no obvious deformities, skin tear on left elbow (bandaged by dialysis center staff), pain rated at "7" out of "10".  Pt is alert and oriented x4 and hx of balance issues because of amputated half of right foot from diabetes complications.  Also vision loss bilaterally with prosthetic eyes bilaterally from diabetes complications.  Monday, Wednesday, Friday dialysis treatments.  VS stable.

## 2013-12-30 NOTE — ED Notes (Signed)
Pt transported to XRay 

## 2013-12-30 NOTE — H&P (Signed)
PCP:   [name], MD   Chief Complaint:  Fall with Left hip fracture  HPI: MR. Dentinger is a 63 year old white male with a history of diabetes mellitus type 2 (diet controlled), blindness status post bilateral enucleation, coronary artery disease status post CABG, peripheral vascular disease status post right foot right amputation, end-stage renal disease on hemodialysis, bilateral renal cell cancer, and prostate cancer who presented to the emergency department after a fall at hemodialysis resulting in left hip pain. Patient has been doing remarkably well despite his medical complexity. He continues with his hemodialysis on Monday Wednesday and Friday at [facility]. This is been complicated by intermittent hypotension after dialysis. Recently, this has been better with you submitted trend. Today, after dialysis he was standing for his posttreatment blood pressure reading when he lost his balance and fell. He denies any lightheadedness or dizziness at that time and attributes it to trying to reach back to grab a chair when his walker slipped.  He fell onto his left side resulting in severe left hip pain. He was brought to the emergency department where x-rays revealed a subcapital femoral neck sclerotic area concerning for impaction type injury/fracture. Orthopedics has been consulted and is anticipating surgery tomorrow. Request medical admission due to his medical complexity.  At baseline, the patient is very active. He continues to exercise regularly participating in multiple walls including the recent Relay [employer] walk. He was referred to the upcoming [facility] Walk.  With vigorous exercise he denies any chest pain or shortness of breath.  He's had multiple surgeries in the past with no prior perioperative complications.  Review of Systems:  Review of Systems - Systems review but the patient and are negative as in history of present illness.  Past Medical History: DM2 with retinopathy,  neuropathy, nephropathy (previously followed by Dr. Debbora Presto)- previously insulin dependant, now diet-controlled. CRI, Stage 3-4 (followed by Dr. Donnetta Hail requiring HD (started 7/12) Hyperlipidemia Coronary artery disease status post CABG (6/13) Elevated PSA- biopsy atypia (2008)- followed by Dr. Valla Leaver CA, Gleason 6 (9/13) Right Renal Cell CA s/p Radiofrequency ablation (1/09)-NED Left Renal Cell CA (Dx: 10/11)-->Radiofrequency ablation (12/11) Prostate CA s/p brachytherapy at [facility] (12/13) Anemia on Procrit colon polyps (11/10) ED Peripheral Vascular Disease  Past Surgical History  Procedure Laterality Date  . Venectomy  1980's    "knees down; both legs; 2 separate times  . Foot amputation  ~ 2002    right;  partial; "infection"  . Cholecystectomy  1992  . Colonoscopy  06/14/2011    Procedure: COLONOSCOPY;  Surgeon: [name], MD;  Location: [facility];  Service: Endoscopy;  Laterality: N/A;  . Hot hemostasis  06/14/2011    Procedure: HOT HEMOSTASIS (ARGON PLASMA COAGULATION/BICAP);  Surgeon: [name], MD;  Location: [facility];  Service: Endoscopy;  Laterality: N/A;  . Av fistula placement  02/2010    LFA  . Varicose vein surgery  mid 1980's    BLE  . Radiofrequency ablation kidney  2010-2012    "twice; one on each side; for cancer"  . Enucleation  2003; 2006    bilateral; "diabetes; pain"  . Cardiac catheterization  ~ 2001  . Coronary artery bypass graft  09/29/2011    Procedure: CORONARY ARTERY BYPASS GRAFTING (CABG);  Surgeon: [name], MD;  Location: [facility];  Service: Open Heart Surgery;  Laterality: N/A;  Coronary Artery Bypass Graft times two utilizing the left internal mammary artery and the right greater saphenous vein harvested endoscopically.  [name]  Colonoscopy N/A 07/24/2012    Procedure: COLONOSCOPY;  Surgeon: [name], MD;  Location: [facility];  Service: Endoscopy;  Laterality: N/A;   s/p Right foot partial amputation  (2004)-->post-op MRSA s/p Bilateral eye enucleation secondary to glaucoma (2004, 2005) s/p vein stripping s/p Left wrist fracture (3rd grade) s/p AVF placement (1/12) Two-vessel CABG (6/13)  Medications: Prior to Admission medications   Medication Sig Start Date End Date Taking? Authorizing Provider  aspirin EC 81 MG tablet Take 81 mg by mouth daily.    Yes Historical Provider, MD  atorvastatin (LIPITOR) 10 MG tablet Take 10 mg by mouth daily.   Yes Historical Provider, MD  b complex-vitamin c-folic acid (NEPHRO-VITE) 0.8 MG TABS Take 0.8 mg by mouth at bedtime.   Yes Historical Provider, MD  calcium acetate (PHOSLO) 667 MG capsule Take 2,001 mg by mouth 3 (three) times daily with meals.    Yes Historical Provider, MD  midodrine (PROAMATINE) 5 MG tablet Take 5 mg by mouth 2 (two) times daily.   Yes Historical Provider, MD    Allergies:   Allergies  Allergen Reactions  . Codeine Other (See Comments)    Makes patient incoherent. STATES MAKES HIM COMATOSE  . Tape Other (See Comments)    Plastic tape tears skin off, please use paper tape instead.    Social History: Married. 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 Cancer    . Coronary artery disease    . Kidney disease    . Breast cancer    . Ovarian cancer    . Lung cancer    . Diabetes    . Cancer Mother 33    breast, spine and ovarian  . Emphysema Father   . Cancer Father 68    kidney, prostate  . Cancer Sister     ovarian  . Cancer Brother     lung  . Malignant hyperthermia Neg Hx    F- COPD, Renal Cell CA +smoker- deceased M- B Breast CA, Ovarian CA (d82)- deceased GP-? bro- deceased Lung CA (smoker), CAD bro- CAD s/p CABG, MI (45s),  Prostate CA (stage IV- 61), d66 Cirrhosis sis- Ovarian CA, Breast CA; BRCA  negative 2 sons- healthy   Physical Exam: Filed Vitals:   12/30/13 1525 12/30/13 1530 12/30/13 1702 12/30/13 1754  BP: 131/72 110/70 115/69 113/58  Pulse: 71 64 65 72  Temp:    99.9 F (37.7 C)  TempSrc:    Oral  Resp: _0 Height:      Weight:      SpO2: 94% 94% 97% 96%   General appearance: alert and no distress Head: Normocephalic, without obvious abnormality, atraumatic Eyes: Status post bilateral enucleation Nose: Nares normal. Septum midline. Mucosa normal. No drainage or sinus tenderness. Throat: lips, mucosa, and tongue normal; teeth and gums normal Neck: no adenopathy, no carotid bruit, no JVD and thyroid not enlarged, symmetric, no tenderness/mass/nodules Resp: clear to auscultation bilaterally Cardio: regular rate and rhythm GI: soft, non-tender; bowel sounds normal; no masses,  no organomegaly Extremities: Right foot transmetatarsal; Pulses: 2+ and symmetric; pain with movement of his left leg Lymph nodes: Cervical adenopathy: no cervical lymphadenopathy Neurologic: Alert and oriented X 3, normal strength and tone.    Labs on Admission:   Recent Labs  12/30/13 1336  NA 143  K 4.3  CL 98  CO2 34*  GLUCOSE 122*  BUN 38*  CREATININE 4.08*  CALCIUM 9.2   No results found for this basename: AST, ALT, ALKPHOS, BILITOT, PROT, ALBUMIN,  in the last 72 hours No results found for this basename: LIPASE, AMYLASE,  in the last 72 hours  Recent Labs  12/30/13 1336  WBC 4.3  NEUTROABS 3.5  HGB 11.5*  HCT 34.3*  MCV 98.6  PLT 108*   No results found for this basename: CKTOTAL, CKMB, CKMBINDEX, TROPONINI,  in the last 72 hours Lab Results  Component Value Date   INR 1.26 09/29/2011   INR 1.11 09/28/2011   INR 1.0 09/21/2011   No results found for this basename: TSH, T4TOTAL, FREET3, T3FREE, THYROIDAB,  in the last 72 hours No results found for this basename: VITAMINB12, FOLATE, FERRITIN, TIBC, IRON, RETICCTPCT,  in the last 72 hours  Radiological  Exams on Admission: Dg Chest 2 View  12/30/2013   CLINICAL DATA:  Fall at dialysis. Left hip pain. History of hypertension.  EXAM: CHEST  2 VIEW  COMPARISON:  10/25/2011  FINDINGS: Patient has had median sternotomy. Heart size is normal. The lungs are clear. No edema. No acute, displaced fractures.  IMPRESSION: No evidence for acute  abnormality.   Electronically Signed   By: Shon Hale M.D.   On: 12/30/2013 15:53   Dg Hip Complete Left  12/30/2013   CLINICAL DATA:  Pain post trauma  EXAM: LEFT HIP - COMPLETE 2+ VIEW  COMPARISON:  None.  FINDINGS: Frontal pelvis as well as frontal and lateral left hip images were obtained. There is a sclerotic focus in the subcapital region of the left proximal femur which is concerning for a potential impaction type injury in this area. There is no other evidence suggesting potential fracture. No dislocation. There is moderate osteoarthritic change in both hip joints. Bones are somewhat osteoporotic.  There are seed implants in the prostate region. There are foci of arterial vascular calcification.  IMPRESSION: There is a linear sclerotic area in the subcapital femoral neck region on the left which raises concern for and impaction type injury in this area. This finding warrants further evaluation. MR would be the study of choice to further evaluate this region. If the patient has a contraindication MRI, CT would be an alternative study for evaluating this area.  Bones are osteoporotic. There is osteoarthritic change in both hip joints. No dislocation. Seed implants in prostate region.   Electronically Signed   By: Lowella Grip M.D.   On: 12/30/2013 14:51   Orders placed during the hospital encounter of 12/30/13  . EKG 12-LEAD- SR with 1st degree AV block; nonspecific ST changes    Assessment/Plan Principal Problem: 1. Left hip fracture- Defer additional imaging to orthapedics- antictipate surgical repair tomorrow. No additional preoperative testing warranted at  this time given his excellent exercise tolerance. He is at increased risk for perioperative complications given his multiple medical issues but prudent to proceed as soon as possible since medical issues are stable. DVT prophylaxis with SCDs preoperatively and per orthopedics postoperatively. Pain control with morphine as needed.   Active Problems: 2.  Diabetes with ophthalmic, renal, vascular complications-  this has been well-controlled with diet alone. We'll use sliding scale insulin. 3. HYPERTENSION-recent blood pressures have been well controlled without treatment. Has experienced some hypotension with dialysis. 4. CAD, s/p CABG 2013-aspirin on hold for upcoming surgery to resumed postoperatively when able. Hold  statin and resume postoperatively. 5. PVD- pulses intact currently with no recent claudication symptoms. 6. ESRD (end stage renal disease) on dialysis-continue hemodialysis on Monday Wednesday and Friday. He received full treatment today. 7. Hypotension of hemodialysis-continue Midodrine with hemodialysis 8. Disposition- patient is very motivated and will likely need skilled nursing facility for acute rehabilitation postoperatively. May be a candidate for an inpatient rehabilitation.  [name] 12/30/2013, 6:21 PM

## 2013-12-30 NOTE — ED Notes (Signed)
Pt returned from X Ray.

## 2013-12-30 NOTE — ED Provider Notes (Signed)
CSN: [id number]     Arrival date & time 12/30/13  1303 History   First MD Initiated Contact with Patient 12/30/13 1304     Chief Complaint  Patient presents with  . Fall     (Consider location/radiation/quality/duration/timing/severity/associated sxs/prior Treatment) HPI Comments: The patient is a 63 year old male with past medical history of CKD on dialysis, blindness, hypotension, anemia presenting to emergency room chief complaint of fall just prior to arrival. The patient reports after being dialyzed, full run of 4 hours, the patient reports standing blood pressure being checked, and upon completion the patient reports he was reaching for the chair with left hand on walker when he had a mechanical fall. He denies loss of consciousness, weakness, numbness. He denies chest pain, shortness breath, abdominal pain, fever.  The patient last dialyzed Friday, current schedule Monday, Wednesday, Friday. Last Td less than 5 days ago.  Patient is a 63 y.o. male presenting with fall. The history is provided by the patient. No language interpreter was used.  Fall Associated symptoms include arthralgias. Pertinent negatives include no abdominal pain, chest pain, chills, coughing, fever, nausea or vomiting.    Past Medical History  Diagnosis Date  . CAD, NATIVE VESSEL 01/08/2008    a. Myoview 08/30/11 at [facility]: No scar, + mild apical, apical lateral, mid anterolateral ischemia, EF 49% => s/p CABG (L-LAD, S-OM)  . HYPERLIPIDEMIA-MIXED 01/08/2008  . OVERWEIGHT/OBESITY 01/08/2008  . HYPERTENSION 01/27/2009  . Anemia   . Blood transfusion ~ 2003  . Bruises easily   . Blind     both eyes removed   . Family history of breast cancer     mother  . Cellulitis late 1980's    "hospitalized; wrapped both legs; several times; no OR for this"  . Peripheral vascular disease   . ESRD (end stage renal disease) on dialysis     09/27/11 [facility]; Mon, Wed, Fri  . Pneumonia 2000;s  . DM type 2 (diabetes mellitus,  type 2)   . Renal cell carcinoma     "both kidneys"  . H/O prostate cancer     seeds implant 03/2012  . Irregular heartbeat   . Colon polyps   . Dialysis patient     M-W-F  . Hypertension due to AV shunt     left forearm   Past Surgical History  Procedure Laterality Date  . Venectomy  1980's    "knees down; both legs; 2 separate times  . Foot amputation  ~ 2002    right;  partial; "infection"  . Cholecystectomy  1992  . Colonoscopy  06/14/2011    Procedure: COLONOSCOPY;  Surgeon: [name], MD;  Location: [facility];  Service: Endoscopy;  Laterality: N/A;  . Hot hemostasis  06/14/2011    Procedure: HOT HEMOSTASIS (ARGON PLASMA COAGULATION/BICAP);  Surgeon: [name], MD;  Location: [facility];  Service: Endoscopy;  Laterality: N/A;  . Av fistula placement  02/2010    LFA  . Varicose vein surgery  mid 1980's    BLE  . Radiofrequency ablation kidney  2010-2012    "twice; one on each side; for cancer"  . Enucleation  2003; 2006    bilateral; "diabetes; pain"  . Cardiac catheterization  ~ 2001  . Coronary artery bypass graft  09/29/2011    Procedure: CORONARY ARTERY BYPASS GRAFTING (CABG);  Surgeon: [name], MD;  Location: [facility];  Service: Open Heart Surgery;  Laterality: N/A;  Coronary Artery Bypass Graft times two utilizing the  left internal mammary artery and the right greater saphenous vein harvested endoscopically.  . Colonoscopy N/A 07/24/2012    Procedure: COLONOSCOPY;  Surgeon: [name], MD;  Location: [facility];  Service: Endoscopy;  Laterality: N/A;   Family History  Problem Relation Age of Onset  . Cancer    . Coronary artery disease    . Kidney disease    . Breast cancer    . Ovarian cancer    . Lung cancer    . Diabetes    . Cancer Mother 17    breast, spine and ovarian  . Emphysema Father   . Cancer Father 39    kidney, prostate  . Cancer Sister     ovarian  . Cancer Brother     lung  . Malignant hyperthermia Neg Hx     History  Substance Use Topics  . Smoking status: Never Smoker   . Smokeless tobacco: Never Used  . Alcohol Use: No    Review of Systems  Constitutional: Negative for fever and chills.  Respiratory: Negative for cough and shortness of breath.   Cardiovascular: Negative for chest pain.  Gastrointestinal: Negative for nausea, vomiting, abdominal pain and diarrhea.  Musculoskeletal: Positive for arthralgias and gait problem.  Skin: Positive for wound.      Allergies  Codeine and Tape  Home Medications   Prior to Admission medications   Medication Sig Start Date End Date Taking? Authorizing Provider  aspirin EC 81 MG tablet Take 81 mg by mouth every other day.     Historical Provider, MD  atorvastatin (LIPITOR) 10 MG tablet TAKE 1 TABLET BY MOUTH EVERY DAY    Thayer Headings, MD  atorvastatin (LIPITOR) 10 MG tablet Take 10 mg by mouth at bedtime.    Historical Provider, MD  b complex-vitamin c-folic acid (NEPHRO-VITE) 0.8 MG TABS Take 0.8 mg by mouth at bedtime.    Historical Provider, MD  calcium acetate (PHOSLO) 667 MG capsule Take 2,001 mg by mouth 3 (three) times daily with meals.     Historical Provider, MD  darbepoetin (ARANESP) 150 MCG/0.3ML SOLN Inject 0.3 mLs (150 mcg total) into the vein every Friday with hemodialysis. 10/05/11   Wayne E Gold, PA-C  iron dextran complex (INFED) 50 MG/ML injection Inject 1 mL (50 mg total) into the vein every Wednesday with hemodialysis. 10/04/11   Arlyn Leak Collins, PA-C  midodrine (PROAMATINE) 5 MG tablet Take 5 mg by mouth 2 (two) times daily.    Historical Provider, MD  sodium chloride 0.9 % SOLN 100 mL with iron dextran complex 50 MG/ML SOLN 50 mg Inject 50 mg into the vein every 7 (seven) days. 10/05/11   Wayne E Gold, PA-C   There were no vitals taken for this visit. Physical Exam  Nursing note and vitals reviewed. Constitutional: He is oriented to person, place, and time. He appears well-developed and well-nourished.  HENT:  Head:  Normocephalic and atraumatic.  Mouth/Throat: No oropharyngeal exudate.  Eyes:  Prosthetic eyes bilaterally.  Neck: Neck supple.  Cardiovascular: Normal rate and regular rhythm.   No lower extremity edema.  Good thrill to upper AV fistula.  Pulmonary/Chest: Effort normal. He has no wheezes. He has no rales.  Abdominal: Soft. There is no tenderness. There is no rebound.  Musculoskeletal: He exhibits tenderness.       Left hip: He exhibits decreased range of motion and tenderness.  Decreased range of motion secondary to pain, no obvious deformity, discomfort with passive internal rotation.  Left elbow, non-tender, full active ROM.  Neurological: He is alert and oriented to person, place, and time. He is not disoriented. He displays no tremor. He exhibits normal muscle tone. GCS eye subscore is 4. GCS verbal subscore is 5. GCS motor subscore is 6.  Speech is clear and goal oriented, follows commands. V/VII-no facial droop.   VIII-normal.   Motor: Strength 5/5 to upper and lower extremities bilaterally. Moves all 4 extremities equally. Sensory: normal sensation to upper and lower extremities.  Cerebellar: Pt able to touch left heel to right shin, with discomfort at left hip. Normal Right heel to left shin. No pronator drift.  Skin: He is not diaphoretic.  Psychiatric: He has a normal mood and affect.    ED Course  Procedures (including critical care time) Labs Review Labs Reviewed - No data to display  Results for orders placed during the hospital encounter of 12/30/13  CBC WITH DIFFERENTIAL      Result Value Ref Range   WBC 4.3  4.0 - 10.5 K/uL   RBC 3.48 (*) 4.22 - 5.81 MIL/uL   Hemoglobin 11.5 (*) 13.0 - 17.0 g/dL   HCT 34.3 (*) 39.0 - 52.0 %   MCV 98.6  78.0 - 100.0 fL   MCH 33.0  26.0 - 34.0 pg   MCHC 33.5  30.0 - 36.0 g/dL   RDW 13.7  11.5 - 15.5 %   Platelets 108 (*) 150 - 400 K/uL   Neutrophils Relative % 81 (*) 43 - 77 %   Neutro Abs 3.5  1.7 - 7.7 K/uL   Lymphocytes  Relative 10 (*) 12 - 46 %   Lymphs Abs 0.4 (*) 0.7 - 4.0 K/uL   Monocytes Relative 6  3 - 12 %   Monocytes Absolute 0.3  0.1 - 1.0 K/uL   Eosinophils Relative 3  0 - 5 %   Eosinophils Absolute 0.1  0.0 - 0.7 K/uL   Basophils Relative 0  0 - 1 %   Basophils Absolute 0.0  0.0 - 0.1 K/uL  BASIC METABOLIC PANEL      Result Value Ref Range   Sodium 143  137 - 147 mEq/L   Potassium 4.3  3.7 - 5.3 mEq/L   Chloride 98  96 - 112 mEq/L   CO2 34 (*) 19 - 32 mEq/L   Glucose, Bld 122 (*) 70 - 99 mg/dL   BUN 38 (*) 6 - 23 mg/dL   Creatinine, Ser 4.08 (*) 0.50 - 1.35 mg/dL   Calcium 9.2  8.4 - 10.5 mg/dL   GFR calc non Af Amer 14 (*) >90 mL/min   GFR calc Af Amer 16 (*) >90 mL/min   Anion gap 11  5 - 15  I-STAT TROPOININ, ED      Result Value Ref Range   Troponin i, poc 0.01  0.00 - 0.08 ng/mL   Comment 3             Imaging Review Dg Chest 2 View  12/30/2013   CLINICAL DATA:  Fall at dialysis. Left hip pain. History of hypertension.  EXAM: CHEST  2 VIEW  COMPARISON:  10/25/2011  FINDINGS: Patient has had median sternotomy. Heart size is normal. The lungs are clear. No edema. No acute, displaced fractures.  IMPRESSION: No evidence for acute  abnormality.   Electronically Signed   By: Shon Hale M.D.   On: 12/30/2013 15:53   Dg Hip Complete Left  12/30/2013   CLINICAL DATA:  Pain  post trauma  EXAM: LEFT HIP - COMPLETE 2+ VIEW  COMPARISON:  None.  FINDINGS: Frontal pelvis as well as frontal and lateral left hip images were obtained. There is a sclerotic focus in the subcapital region of the left proximal femur which is concerning for a potential impaction type injury in this area. There is no other evidence suggesting potential fracture. No dislocation. There is moderate osteoarthritic change in both hip joints. Bones are somewhat osteoporotic.  There are seed implants in the prostate region. There are foci of arterial vascular calcification.  IMPRESSION: There is a linear sclerotic area in the  subcapital femoral neck region on the left which raises concern for and impaction type injury in this area. This finding warrants further evaluation. MR would be the study of choice to further evaluate this region. If the patient has a contraindication MRI, CT would be an alternative study for evaluating this area.  Bones are osteoporotic. There is osteoarthritic change in both hip joints. No dislocation. Seed implants in prostate region.   Electronically Signed   By: Lowella Grip M.D.   On: 12/30/2013 14:51     EKG Interpretation   Date/Time:  Monday December 30 2013 13:11:22 EDT Ventricular Rate:  59 PR Interval:  265 QRS Duration: 90 QT Interval:  489 QTC Calculation: 484 R Axis:   22 Text Interpretation:  Sinus or ectopic atrial rhythm Prolonged PR interval  Low voltage, precordial leads Nonspecific T abnormalities, lateral leads  Borderline prolonged QT interval Baseline wander in lead(s) V3 Since last  tracing T wave abnormality NOW PRESENT Abnormal ekg Confirmed by MILLER   MD, [name] ([id number]) on 12/30/2013 2:07:46 PM      MDM   Final diagnoses:  Closed left hip fracture, initial encounter  CKD (chronic kidney disease), unspecified stage  Dialysis patient  Fall, initial encounter   Patient presents after fall with recent dialysis. Patient only complains of left hip pain, denies loss of consciousness. Labs, EKG, x-ray ordered. Pain medication ordered. CBC shows hemoglobin 11.5, elevated from 1 year ago. BMP shows creatinine 4.08, BUN 38 history of dialysis recently dialyzed. No hyperkalemia. XR shows linear sclerotic area in the Left femoral neck. Dr. Sabra Heck also evaluated the patient during this encounter. Dr. Percell Miller consulted for orthopedics, plan for surgery tomorrow. Awaiting internal medicine consult. Discussed lab results, imaging results, and treatment plan with the patient and pts wife.   4:15 PM Discussed pt history, condition with Dr. Brigitte Pulse, advises [facility]  admission. 4:33 PM Discussed pt with Dr. Dyann Kief who agrees to admit the patient.  Meds given in ED:  Medications  fentaNYL (SUBLIMAZE) injection 100 mcg (not administered)  fentaNYL (SUBLIMAZE) injection 25 mcg (not administered)  fentaNYL (SUBLIMAZE) injection 12.5 mcg (12.5 mcg Intravenous Given 12/30/13 1417)  morphine 4 MG/ML injection 4 mg (4 mg Intravenous Given 12/30/13 1525)    New Prescriptions   No medications on file    Harvie Heck, PA-C 12/30/13 1701

## 2013-12-30 NOTE — Consult Note (Signed)
ORTHOPAEDIC CONSULTATION  REQUESTING PHYSICIAN: Johnna Acosta, MD  Chief Complaint: left hip fracture  HPI: [name] is a 63 y.o. male who complains of  A mechanical fall onto the left side and left leg pain.   Past Medical History  Diagnosis Date  . CAD, NATIVE VESSEL 01/08/2008    a. Myoview 08/30/11 at [facility]: No scar, + mild apical, apical lateral, mid anterolateral ischemia, EF 49% => s/p CABG (L-LAD, S-OM)  . HYPERLIPIDEMIA-MIXED 01/08/2008  . OVERWEIGHT/OBESITY 01/08/2008  . HYPERTENSION 01/27/2009  . Anemia   . Blood transfusion ~ 2003  . Bruises easily   . Blind     both eyes removed   . Family history of breast cancer     mother  . Cellulitis late 1980's    "hospitalized; wrapped both legs; several times; no OR for this"  . Peripheral vascular disease   . ESRD (end stage renal disease) on dialysis     09/27/11 [facility]; Mon, Wed, Fri  . Pneumonia 2000;s  . DM type 2 (diabetes mellitus, type 2)   . Renal cell carcinoma     "both kidneys"  . H/O prostate cancer     seeds implant 03/2012  . Irregular heartbeat   . Colon polyps   . Dialysis patient     M-W-F  . Hypertension due to AV shunt     left forearm   Past Surgical History  Procedure Laterality Date  . Venectomy  1980's    "knees down; both legs; 2 separate times  . Foot amputation  ~ 2002    right;  partial; "infection"  . Cholecystectomy  1992  . Colonoscopy  06/14/2011    Procedure: COLONOSCOPY;  Surgeon: [name], MD;  Location: [facility];  Service: Endoscopy;  Laterality: N/A;  . Hot hemostasis  06/14/2011    Procedure: HOT HEMOSTASIS (ARGON PLASMA COAGULATION/BICAP);  Surgeon: [name], MD;  Location: [facility];  Service: Endoscopy;  Laterality: N/A;  . Av fistula placement  02/2010    LFA  . Varicose vein surgery  mid 1980's    BLE  . Radiofrequency ablation kidney  2010-2012    "twice; one on each side; for cancer"  . Enucleation  2003; 2006    bilateral;  "diabetes; pain"  . Cardiac catheterization  ~ 2001  . Coronary artery bypass graft  09/29/2011    Procedure: CORONARY ARTERY BYPASS GRAFTING (CABG);  Surgeon: [name], MD;  Location: [place];  Service: Open Heart Surgery;  Laterality: N/A;  Coronary Artery Bypass Graft times two utilizing the left internal mammary artery and the right greater saphenous vein harvested endoscopically.  . Colonoscopy N/A 07/24/2012    Procedure: COLONOSCOPY;  Surgeon: [name], MD;  Location: [facility];  Service: Endoscopy;  Laterality: N/A;   History   Social History  . Marital Status: Married    Spouse Name: N/A    Number of Children: N/A  . Years of Education: N/A   Social History Main Topics  . Smoking status: Never Smoker   . Smokeless tobacco: Never Used  . Alcohol Use: No  . Drug Use: No  . Sexual Activity: Yes   Other Topics Concern  . None   Social History Narrative  . None   Family History  Problem Relation Age of Onset  . Cancer    . Coronary artery disease    . Kidney disease    . Breast cancer    .  Ovarian cancer    . Lung cancer    . Diabetes    . Cancer Mother 50    breast, spine and ovarian  . Emphysema Father   . Cancer Father 53    kidney, prostate  . Cancer Sister     ovarian  . Cancer Brother     lung  . Malignant hyperthermia Neg Hx    Allergies  Allergen Reactions  . Codeine Other (See Comments)    Makes patient incoherent. STATES MAKES HIM COMATOSE  . Tape Other (See Comments)    Plastic tape tears skin off, please use paper tape instead.   Prior to Admission medications   Medication Sig Start Date End Date Taking? Authorizing Provider  aspirin EC 81 MG tablet Take 81 mg by mouth daily.    Yes Historical Provider, MD  atorvastatin (LIPITOR) 10 MG tablet Take 10 mg by mouth daily.   Yes Historical Provider, MD  b complex-vitamin c-folic acid (NEPHRO-VITE) 0.8 MG TABS Take 0.8 mg by mouth at bedtime.   Yes Historical Provider, MD  calcium  acetate (PHOSLO) 667 MG capsule Take 2,001 mg by mouth 3 (three) times daily with meals.    Yes Historical Provider, MD  midodrine (PROAMATINE) 5 MG tablet Take 5 mg by mouth 2 (two) times daily.   Yes Historical Provider, MD   Dg Chest 2 View  12/30/2013   CLINICAL DATA:  Fall at dialysis. Left hip pain. History of hypertension.  EXAM: CHEST  2 VIEW  COMPARISON:  10/25/2011  FINDINGS: Patient has had median sternotomy. Heart size is normal. The lungs are clear. No edema. No acute, displaced fractures.  IMPRESSION: No evidence for acute  abnormality.   Electronically Signed   By: Shon Hale M.D.   On: 12/30/2013 15:53   Dg Hip Complete Left  12/30/2013   CLINICAL DATA:  Pain post trauma  EXAM: LEFT HIP - COMPLETE 2+ VIEW  COMPARISON:  None.  FINDINGS: Frontal pelvis as well as frontal and lateral left hip images were obtained. There is a sclerotic focus in the subcapital region of the left proximal femur which is concerning for a potential impaction type injury in this area. There is no other evidence suggesting potential fracture. No dislocation. There is moderate osteoarthritic change in both hip joints. Bones are somewhat osteoporotic.  There are seed implants in the prostate region. There are foci of arterial vascular calcification.  IMPRESSION: There is a linear sclerotic area in the subcapital femoral neck region on the left which raises concern for and impaction type injury in this area. This finding warrants further evaluation. MR would be the study of choice to further evaluate this region. If the patient has a contraindication MRI, CT would be an alternative study for evaluating this area.  Bones are osteoporotic. There is osteoarthritic change in both hip joints. No dislocation. Seed implants in prostate region.   Electronically Signed   By: Lowella Grip M.D.   On: 12/30/2013 14:51    Positive ROS: All other systems have been reviewed and were otherwise negative with the exception of those  mentioned in the HPI and as above.  Labs cbc  Recent Labs  12/30/13 1336  WBC 4.3  HGB 11.5*  HCT 34.3*  PLT 108*    Labs inflam No results found for this basename: ESR, CRP,  in the last 72 hours  Labs coag No results found for this basename: INR, PT, PTT,  in the last 72 hours  Recent Labs  12/30/13 1336  NA 143  K 4.3  CL 98  CO2 34*  GLUCOSE 122*  BUN 38*  CREATININE 4.08*  CALCIUM 9.2    Physical Exam: Filed Vitals:   12/30/13 1530  BP: 110/70  Pulse: 64  Temp:   Resp: 21   General: Alert, no acute distress Cardiovascular: No pedal edema Respiratory: No cyanosis, no use of accessory musculature GI: No organomegaly, abdomen is soft and non-tender Skin: No lesions in the area of chief complaint other than those listed below in MSK exam.  Neurologic: Sensation intact distally Psychiatric: Patient is competent for consent with normal mood and affect Lymphatic: No axillary or cervical lymphadenopathy  MUSCULOSKELETAL:  LLE: pain with any ROM,  SILT DP/SP/S/S/T nerve, 2+ DP, +TA/GS/EHL Compartments soft  Other extremities are atraumatic with painless ROM and NVI.  Assessment: Left fem neck fracture  Plan: Hip pinning today Weight Bearing Status: bedrest for now, likely WBAT post op PT VTE px: SCD's and ASA post op   Edmonia Lynch, D, MD Cell [phone]   12/30/2013 4:17 PM

## 2013-12-30 NOTE — ED Provider Notes (Signed)
Medical screening examination/treatment/procedure(s) were conducted as a shared visit with non-physician practitioner(s) and myself.  I personally evaluated the patient during the encounter  Please see my separate respective documentation pertaining to this patient encounter   Johnna Acosta, MD 12/30/13 [phone]

## 2013-12-30 NOTE — ED Provider Notes (Signed)
63 year old male dialysis patient with blindness presents after falling at dialysis when he stumbled and fell backwards onto his left hip. This was acute in onset and left the patient with a moderate to severely painful left hip and the inability to ambulate afterwards. On exam the patient has significant tenderness with any range of motion or palpation of the left hip. The x-ray shows that he has a likely subcapital fracture of the left hip, he has no other signs of significant injury other than some skin tears to the left elbow. No head injury, no neurologic deficits, no chest pain or shortness of breath and the rest of his exam is benign. His dialysis access in the left upper extremity has a good thrill and is nonbleeding.  Discussed with Dr. Percell Mateus Rewerts who states he will be able to do surgery tomorrow, hospitalist consultation.  Medical screening examination/treatment/procedure(s) were conducted as a shared visit with non-physician practitioner(s) and myself.  I personally evaluated the patient during the encounter.  Clinical Impression:   Final diagnoses:  Closed left hip fracture, initial encounter  CKD (chronic kidney disease), unspecified stage  Dialysis patient  Fall, initial encounter         Johnna Acosta, MD 12/30/13 [phone]

## 2013-12-31 ENCOUNTER — Inpatient Hospital Stay (HOSPITAL_COMMUNITY): Payer: Medicare Other | Admitting: Anesthesiology

## 2013-12-31 ENCOUNTER — Inpatient Hospital Stay (HOSPITAL_COMMUNITY): Payer: Medicare Other

## 2013-12-31 ENCOUNTER — Encounter (HOSPITAL_COMMUNITY)
Admission: EM | Disposition: A | Discharge status: Skilled Nursing Facility | Payer: Self-pay | Source: Home / Self Care | Attending: Internal Medicine

## 2013-12-31 ENCOUNTER — Encounter (HOSPITAL_COMMUNITY): Payer: Medicare Other | Admitting: Anesthesiology

## 2013-12-31 ENCOUNTER — Encounter (HOSPITAL_COMMUNITY): Payer: Self-pay | Admitting: Certified Registered Nurse Anesthetist

## 2013-12-31 HISTORY — PX: HIP PINNING,CANNULATED: SHX1758

## 2013-12-31 LAB — BASIC METABOLIC PANEL
ANION GAP: 15 (ref 5–15)
BUN: 56 mg/dL — ABNORMAL HIGH (ref 6–23)
CHLORIDE: 96 meq/L (ref 96–112)
CO2: 31 meq/L (ref 19–32)
Calcium: 9.1 mg/dL (ref 8.4–10.5)
Creatinine, Ser: 5.93 mg/dL — ABNORMAL HIGH (ref 0.50–1.35)
GFR calc Af Amer: 10 mL/min — ABNORMAL LOW (ref >90)
GFR calc non Af Amer: 9 mL/min — ABNORMAL LOW (ref >90)
Glucose, Bld: 119 mg/dL — ABNORMAL HIGH (ref 70–99)
POTASSIUM: 4.3 meq/L (ref 3.7–5.3)
SODIUM: 142 meq/L (ref 137–147)

## 2013-12-31 LAB — CBC
HCT: 32.4 % — ABNORMAL LOW (ref 39.0–52.0)
HEMOGLOBIN: 10.7 g/dL — AB (ref 13.0–17.0)
MCH: 32.8 pg (ref 26.0–34.0)
MCHC: 33 g/dL (ref 30.0–36.0)
MCV: 99.4 fL (ref 78.0–100.0)
Platelets: 97 10*3/uL — ABNORMAL LOW (ref 150–400)
RBC: 3.26 MIL/uL — ABNORMAL LOW (ref 4.22–5.81)
RDW: 14.2 % (ref 11.5–15.5)
WBC: 4.8 10*3/uL (ref 4.0–10.5)

## 2013-12-31 LAB — GLUCOSE, CAPILLARY
GLUCOSE-CAPILLARY: 104 mg/dL — AB (ref 70–99)
Glucose-Capillary: 120 mg/dL — ABNORMAL HIGH (ref 70–99)
Glucose-Capillary: 86 mg/dL (ref 70–99)
Glucose-Capillary: 101 mg/dL — ABNORMAL HIGH (ref 70–99)
Glucose-Capillary: 149 mg/dL — ABNORMAL HIGH (ref 70–99)

## 2013-12-31 LAB — SURGICAL PCR SCREEN
MRSA, PCR: NEGATIVE
STAPHYLOCOCCUS AUREUS: NEGATIVE

## 2013-12-31 SURGERY — FIXATION, FEMUR, NECK, PERCUTANEOUS, USING SCREW
Anesthesia: General | Laterality: Left

## 2013-12-31 MED ORDER — PROPOFOL 10 MG/ML IV BOLUS
INTRAVENOUS | Status: AC
  Filled 2013-12-31: qty 20

## 2013-12-31 MED ORDER — PROPOFOL 10 MG/ML IV BOLUS
INTRAVENOUS | Status: DC | PRN
  Administered 2013-12-31: 160 mg via INTRAVENOUS

## 2013-12-31 MED ORDER — HYDROCODONE-ACETAMINOPHEN 5-325 MG PO TABS
1.0000 | ORAL_TABLET | Freq: Four times a day (QID) | ORAL | Status: DC | PRN
  Administered 2013-12-31 – 2014-01-04 (×7): 2 via ORAL
  Filled 2013-12-31 (×7): qty 2

## 2013-12-31 MED ORDER — SODIUM CHLORIDE 0.9 % IV SOLN
Freq: Once | INTRAVENOUS | Status: AC
  Administered 2013-12-31: 12:00:00 via INTRAVENOUS

## 2013-12-31 MED ORDER — ONDANSETRON HCL 4 MG/2ML IJ SOLN
INTRAMUSCULAR | Status: AC
  Filled 2013-12-31: qty 2

## 2013-12-31 MED ORDER — ONDANSETRON HCL 4 MG PO TABS: 4.0000 mg | ORAL_TABLET | Freq: Three times a day (TID) | ORAL | Status: DC | PRN

## 2013-12-31 MED ORDER — FENTANYL CITRATE 0.05 MG/ML IJ SOLN
INTRAMUSCULAR | Status: DC | PRN
  Administered 2013-12-31: 100 ug via INTRAVENOUS

## 2013-12-31 MED ORDER — PHENOL 1.4 % MT LIQD: 1.0000 | OROMUCOSAL | Status: DC | PRN

## 2013-12-31 MED ORDER — DOCUSATE SODIUM 100 MG PO CAPS: 100.0000 mg | ORAL_CAPSULE | Freq: Two times a day (BID) | ORAL | Status: DC

## 2013-12-31 MED ORDER — GLYCOPYRROLATE 0.2 MG/ML IJ SOLN
INTRAMUSCULAR | Status: AC
  Filled 2013-12-31: qty 3

## 2013-12-31 MED ORDER — LIDOCAINE HCL (CARDIAC) 20 MG/ML IV SOLN
INTRAVENOUS | Status: DC | PRN
  Administered 2013-12-31: 80 mg via INTRAVENOUS

## 2013-12-31 MED ORDER — NEOSTIGMINE METHYLSULFATE 10 MG/10ML IV SOLN
INTRAVENOUS | Status: AC
  Filled 2013-12-31: qty 3

## 2013-12-31 MED ORDER — 0.9 % SODIUM CHLORIDE (POUR BTL) OPTIME
TOPICAL | Status: DC | PRN
  Administered 2013-12-31: 1000 mL

## 2013-12-31 MED ORDER — ROCURONIUM BROMIDE 50 MG/5ML IV SOLN
INTRAVENOUS | Status: AC
  Filled 2013-12-31: qty 1

## 2013-12-31 MED ORDER — ONDANSETRON HCL 4 MG/2ML IJ SOLN
INTRAMUSCULAR | Status: DC | PRN
  Administered 2013-12-31: 4 mg via INTRAVENOUS

## 2013-12-31 MED ORDER — SODIUM CHLORIDE 0.9 % IV SOLN
INTRAVENOUS | Status: DC | PRN
  Administered 2013-12-31: 13:00:00 via INTRAVENOUS

## 2013-12-31 MED ORDER — HYDROMORPHONE HCL 1 MG/ML IJ SOLN
INTRAMUSCULAR | Status: AC
  Filled 2013-12-31: qty 1

## 2013-12-31 MED ORDER — FENTANYL CITRATE 0.05 MG/ML IJ SOLN
INTRAMUSCULAR | Status: AC
  Filled 2013-12-31: qty 5

## 2013-12-31 MED ORDER — HYDROCODONE-ACETAMINOPHEN 5-325 MG PO TABS: 1.0000 | ORAL_TABLET | ORAL | Status: DC | PRN

## 2013-12-31 MED ORDER — DARBEPOETIN ALFA-POLYSORBATE 25 MCG/0.42ML IJ SOLN
25.0000 ug | INTRAMUSCULAR | Status: DC
  Filled 2013-12-31: qty 0.42

## 2013-12-31 MED ORDER — PHENYLEPHRINE HCL 10 MG/ML IJ SOLN
10.0000 mg | INTRAVENOUS | Status: DC | PRN
  Administered 2013-12-31: 20 ug/min via INTRAVENOUS

## 2013-12-31 MED ORDER — ASPIRIN EC 325 MG PO TBEC: 325.0000 mg | DELAYED_RELEASE_TABLET | Freq: Every day | ORAL | Status: DC

## 2013-12-31 MED ORDER — CEFAZOLIN SODIUM-DEXTROSE 2-3 GM-% IV SOLR
2.0000 g | Freq: Four times a day (QID) | INTRAVENOUS | Status: AC
  Administered 2013-12-31 – 2014-01-01 (×2): 2 g via INTRAVENOUS
  Filled 2013-12-31 (×2): qty 50

## 2013-12-31 MED ORDER — SUCCINYLCHOLINE CHLORIDE 20 MG/ML IJ SOLN
INTRAMUSCULAR | Status: AC
  Filled 2013-12-31: qty 1

## 2013-12-31 MED ORDER — ASPIRIN EC 325 MG PO TBEC
325.0000 mg | DELAYED_RELEASE_TABLET | Freq: Every day | ORAL | Status: DC
  Administered 2014-01-01 – 2014-01-02 (×2): 325 mg via ORAL
  Filled 2013-12-31 (×5): qty 1

## 2013-12-31 MED ORDER — OXYCODONE-ACETAMINOPHEN 5-325 MG PO TABS: 1.0000 | ORAL_TABLET | ORAL | Status: DC | PRN

## 2013-12-31 MED ORDER — HYDROMORPHONE HCL 1 MG/ML IJ SOLN
0.2500 mg | INTRAMUSCULAR | Status: DC | PRN
  Administered 2013-12-31 (×2): 0.5 mg via INTRAVENOUS

## 2013-12-31 MED ORDER — CALCIUM CHLORIDE 10 % IV SOLN
INTRAVENOUS | Status: AC
  Filled 2013-12-31: qty 10

## 2013-12-31 MED ORDER — ONDANSETRON HCL 4 MG/2ML IJ SOLN
4.0000 mg | Freq: Four times a day (QID) | INTRAMUSCULAR | Status: DC | PRN
  Administered 2014-01-04: 4 mg via INTRAVENOUS
  Filled 2013-12-31 (×2): qty 2

## 2013-12-31 MED ORDER — NEOSTIGMINE METHYLSULFATE 10 MG/10ML IV SOLN
INTRAVENOUS | Status: DC | PRN
  Administered 2013-12-31: 4 mg via INTRAVENOUS

## 2013-12-31 MED ORDER — GLYCOPYRROLATE 0.2 MG/ML IJ SOLN
INTRAMUSCULAR | Status: DC | PRN
  Administered 2013-12-31: 0.6 mg via INTRAVENOUS

## 2013-12-31 MED ORDER — NEOSTIGMINE METHYLSULFATE 10 MG/10ML IV SOLN
INTRAVENOUS | Status: AC
  Filled 2013-12-31: qty 1

## 2013-12-31 MED ORDER — PROMETHAZINE HCL 25 MG/ML IJ SOLN: 6.2500 mg | INTRAMUSCULAR | Status: DC | PRN

## 2013-12-31 MED ORDER — ROCURONIUM BROMIDE 100 MG/10ML IV SOLN
INTRAVENOUS | Status: DC | PRN
  Administered 2013-12-31: 20 mg via INTRAVENOUS

## 2013-12-31 MED ORDER — SODIUM BICARBONATE 8.4 % IV SOLN
INTRAVENOUS | Status: AC
  Filled 2013-12-31: qty 50

## 2013-12-31 MED ORDER — GLYCOPYRROLATE 0.2 MG/ML IJ SOLN
INTRAMUSCULAR | Status: AC
  Filled 2013-12-31: qty 1

## 2013-12-31 MED ORDER — MIDAZOLAM HCL 2 MG/2ML IJ SOLN
INTRAMUSCULAR | Status: AC
  Filled 2013-12-31: qty 2

## 2013-12-31 MED ORDER — SODIUM CHLORIDE 0.9 % IV SOLN
62.5000 mg | INTRAVENOUS | Status: DC
  Administered 2014-01-01: 62.5 mg via INTRAVENOUS
  Filled 2013-12-31 (×3): qty 5

## 2013-12-31 MED ORDER — ONDANSETRON HCL 4 MG/2ML IJ SOLN
4.0000 mg | Freq: Once | INTRAMUSCULAR | Status: AC
  Administered 2013-12-31: 4 mg via INTRAVENOUS

## 2013-12-31 MED ORDER — MENTHOL 3 MG MT LOZG: 1.0000 | LOZENGE | OROMUCOSAL | Status: DC | PRN

## 2013-12-31 MED ORDER — EPHEDRINE SULFATE 50 MG/ML IJ SOLN
INTRAMUSCULAR | Status: DC | PRN
  Administered 2013-12-31: 15 mg via INTRAVENOUS
  Administered 2013-12-31 (×3): 10 mg via INTRAVENOUS

## 2013-12-31 SURGICAL SUPPLY — 44 items
BIT DRILL 4.9 CANNULATED (BIT) ×1
BIT DRILL CANN QC 4.9 LRG (BIT) IMPLANT
BNDG COHESIVE 6X5 TAN STRL LF (GAUZE/BANDAGES/DRESSINGS) ×2 IMPLANT
BNDG GAUZE ELAST 4 BULKY (GAUZE/BANDAGES/DRESSINGS) ×2 IMPLANT
CLOSURE STERI-STRIP 1/2X4 (GAUZE/BANDAGES/DRESSINGS) ×1
CLSR STERI-STRIP ANTIMIC 1/2X4 (GAUZE/BANDAGES/DRESSINGS) ×1 IMPLANT
COVER PERINEAL POST (MISCELLANEOUS) ×3 IMPLANT
COVER SURGICAL LIGHT HANDLE (MISCELLANEOUS) ×3 IMPLANT
DRAPE IMP U-DRAPE 54X76 (DRAPES) ×3 IMPLANT
DRAPE STERI IOBAN 125X83 (DRAPES) ×3 IMPLANT
DRILL BIT CANNULATED 4.9 (BIT) ×3
DRSG EMULSION OIL 3X3 NADH (GAUZE/BANDAGES/DRESSINGS) ×3 IMPLANT
DRSG TEGADERM 4X4.75 (GAUZE/BANDAGES/DRESSINGS) ×3 IMPLANT
DURAPREP 26ML APPLICATOR (WOUND CARE) ×3 IMPLANT
ELECT REM PT RETURN 9FT ADLT (ELECTROSURGICAL)
ELECTRODE REM PT RTRN 9FT ADLT (ELECTROSURGICAL) ×1 IMPLANT
GAUZE SPONGE 4X4 12PLY STRL (GAUZE/BANDAGES/DRESSINGS) ×3 IMPLANT
GLOVE BIO SURGEON STRL SZ7.5 (GLOVE) ×6 IMPLANT
GLOVE BIOGEL PI IND STRL 8 (GLOVE) ×1 IMPLANT
GLOVE BIOGEL PI INDICATOR 8 (GLOVE) ×2
GOWN STRL REUS W/ TWL LRG LVL3 (GOWN DISPOSABLE) ×1 IMPLANT
GOWN STRL REUS W/TWL LRG LVL3 (GOWN DISPOSABLE) ×3
GUIDEWIRE ASNIS 3.2 NONCAL (WIRE) ×6 IMPLANT
KIT BASIN OR (CUSTOM PROCEDURE TRAY) ×3 IMPLANT
KIT ROOM TURNOVER OR (KITS) ×3 IMPLANT
LINER BOOT UNIVERSAL DISP (MISCELLANEOUS) ×3 IMPLANT
MANIFOLD NEPTUNE II (INSTRUMENTS) ×1 IMPLANT
NS IRRIG 1000ML POUR BTL (IV SOLUTION) ×3 IMPLANT
PACK GENERAL/GYN (CUSTOM PROCEDURE TRAY) ×3 IMPLANT
PAD ARMBOARD 7.5X6 YLW CONV (MISCELLANEOUS) ×6 IMPLANT
PADDING CAST COTTON 6X4 STRL (CAST SUPPLIES) ×2 IMPLANT
SCREW ASNIS 6.5X105MM (Screw) ×4 IMPLANT
SCREW ASNIS 6.5X110MM (Screw) ×2 IMPLANT
SCREW CANN ASNIS 6.5X105 STRL (Screw) IMPLANT
SCREW CANN ASNIS 6.5X110 STRL (Screw) IMPLANT
SPONGE GAUZE 4X4 12PLY STER LF (GAUZE/BANDAGES/DRESSINGS) ×2 IMPLANT
STAPLER VISISTAT 35W (STAPLE) ×3 IMPLANT
SUT MON AB 2-0 CT1 36 (SUTURE) ×3 IMPLANT
SUT VIC AB 0 CT1 27 (SUTURE) ×3
SUT VIC AB 0 CT1 27XBRD ANBCTR (SUTURE) ×1 IMPLANT
TOWEL OR 17X24 6PK STRL BLUE (TOWEL DISPOSABLE) ×3 IMPLANT
TOWEL OR 17X26 10 PK STRL BLUE (TOWEL DISPOSABLE) ×3 IMPLANT
TOWEL OR NON WOVEN STRL DISP B (DISPOSABLE) ×3 IMPLANT
WATER STERILE IRR 1000ML POUR (IV SOLUTION) ×1 IMPLANT

## 2013-12-31 NOTE — Progress Notes (Signed)
Pt admitted to pacu with functioning lt arm fistula

## 2013-12-31 NOTE — Op Note (Signed)
12/30/2013 - 12/31/2013  2:27 PM  PATIENT:  [name]    PRE-OPERATIVE DIAGNOSIS:  Left Hip Fracture  POST-OPERATIVE DIAGNOSIS:  Same  PROCEDURE:  CANNULATED HIP PINNING  SURGEON:  Kista Robb, D, MD  ASSISTANT: Joya Gaskins, OPA, He was necessary for efficiency and safety of the case.   ANESTHESIA:   General  PREOPERATIVE INDICATIONS:  [name] is a  63 y.o. male who fell and was found to have a diagnosis of Left Hip Fracture who elected for surgical management.    The risks benefits and alternatives were discussed with the patient preoperatively including but not limited to the risks of infection, bleeding, nerve injury, cardiopulmonary complications, blood clots, malunion, nonunion, avascular necrosis, the need for revision surgery, the potential for conversion to hemiarthroplasty, among others, and the patient was willing to proceed.  OPERATIVE IMPLANTS: 6.5 mm cannulated screws x3  OPERATIVE FINDINGS: Clinical osteoporosis with weak bone, proximal femur  OPERATIVE PROCEDURE: The patient was brought to the operating room and placed in supine position. IV antibiotics were given. General anesthesia administered. Foley was also given. The patient was placed on the fracture table. The operative extremity was positioned, without any significant reduction maneuver and was prepped and draped in usual sterile fashion.  Time out was performed.  Small incisions were made distal to the greater trochanter, and 3 guidewires were introduced Into an inverted triangle configuration. The lengths were measured. The reduction was slightly valgus, and near-anatomic. I opened the cortex with a cannulated drill, and then placed the screws into position. Satisfactory fixation was achieved. I sequentially tightened the screws by hand.  I performed a live fluoroscopic exam and no screw penetrance was noted. All threads crossed the fracture site.   The wounds were irrigated copiously, and  repaired with Vicryl with Steri-Strips and sterile gauze. There no complications and the patient tolerated the procedure well.  The patient will be weightbearing as tolerated, VTE prophylaxis will be: Ambulation, SCD's and ASA 325 for 30 days   This note was generated using a template and dragon dictation system. In light of that, I have reviewed the note and all aspects of it are applicable to this case. Any dictation errors are due to the computerized dictation system.

## 2013-12-31 NOTE — Consult Note (Signed)
I have personally seen and examined this patient and agree with the assessment/plan as outlined above by [facility] PA. Left hip fracture now s/p cannulated hip pinning seen right after surgery with expected post-op discomfort. Heparin-free dialysis tomorrow. Sabastien Tyler K.,MD 12/31/2013 4:33 PM

## 2013-12-31 NOTE — Anesthesia Procedure Notes (Signed)
Procedure Name: Intubation Date/Time: 12/31/2013 1:40 PM Performed by: Blair Heys E Pre-anesthesia Checklist: Patient identified, Emergency Drugs available, Suction available and Patient being monitored Patient Re-evaluated:Patient Re-evaluated prior to inductionOxygen Delivery Method: Circle system utilized Preoxygenation: Pre-oxygenation with 100% oxygen Intubation Type: IV induction Ventilation: Mask ventilation without difficulty and Oral airway inserted - appropriate to patient size Laryngoscope Size: Sabra Heck and 2 Grade View: Grade I Tube type: Oral Tube size: 7.5 mm Number of attempts: 1 Airway Equipment and Method: Stylet and Oral airway Placement Confirmation: ETT inserted through vocal cords under direct vision,  positive ETCO2 and breath sounds checked- equal and bilateral Secured at: 23 cm Tube secured with: Tape Dental Injury: Teeth and Oropharynx as per pre-operative assessment

## 2013-12-31 NOTE — Transfer of Care (Signed)
Immediate Anesthesia Transfer of Care Note  Patient: [name]  Procedure(s) Performed: Procedure(s) with comments: CANNULATED HIP PINNING (Left) - Carm, FX Table, [employer]  Patient Location: PACU  Anesthesia Type:General  Level of Consciousness: awake, alert  and oriented  Airway & Oxygen Therapy: Patient connected to nasal cannula oxygen  Post-op Assessment: Report given to PACU RN and Post -op Vital signs reviewed and stable  Post vital signs: Reviewed and stable  Complications: No apparent anesthesia complications

## 2013-12-31 NOTE — Progress Notes (Signed)
Subjective: Left hip pain is fairly well controlled with MSO4 every 3 hours.  No new complaints this am.  Anticipating surgery ~11:30.    Objective: Vital signs in last 24 hours: Temp:  [97.6 F (36.4 C)-99.9 F (37.7 C)] 98.6 F (37 C) (09/29 0551) Pulse Rate:  [59-73] 63 (09/29 0551) Resp:  [12-30] 18 (09/29 0551) BP: (105-135)/(58-72) 109/70 mmHg (09/29 0551) SpO2:  [92 %-97 %] 94 % (09/29 0551) Weight:  [97.977 kg (216 lb)] 97.977 kg (216 lb) (09/28 1312) Weight change:  Last BM Date: 12/28/13  CBG (last 3)   Recent Labs  12/30/13 2132 12/31/13 0621  GLUCAP 132* 120*    Intake/Output from previous day: 09/28 0701 - 09/29 0700 In: 220 [P.O.:220] Out: -  Intake/Output this shift:    General appearance: alert and no distress Eyes: no scleral icterus Throat: oropharynx moist without erythema Resp: clear to auscultation bilaterally Cardio: regular rate and rhythm GI: soft, non-tender; bowel sounds normal; no masses,  no organomegaly Extremities: no clubbing, cyanosis or edema; SCDs in place   Lab Results:  Recent Labs  12/30/13 1336 12/31/13 0545  NA 143 142  K 4.3 4.3  CL 98 96  CO2 34* 31  GLUCOSE 122* 119*  BUN 38* 56*  CREATININE 4.08* 5.93*  CALCIUM 9.2 9.1     Recent Labs  12/30/13 1336 12/31/13 0545  WBC 4.3 4.8  NEUTROABS 3.5  --   HGB 11.5* 10.7*  HCT 34.3* 32.4*  MCV 98.6 99.4  PLT 108* 97*   Lab Results  Component Value Date   INR 1.26 09/29/2011   INR 1.11 09/28/2011   INR 1.0 09/21/2011    Studies/Results: Dg Chest 2 View  12/30/2013   CLINICAL DATA:  Fall at dialysis. Left hip pain. History of hypertension.  EXAM: CHEST  2 VIEW  COMPARISON:  10/25/2011  FINDINGS: Patient has had median sternotomy. Heart size is normal. The lungs are clear. No edema. No acute, displaced fractures.  IMPRESSION: No evidence for acute  abnormality.   Electronically Signed   By: Shon Hale M.D.   On: 12/30/2013 15:53   Dg Hip Complete  Left  12/30/2013   CLINICAL DATA:  Pain post trauma  EXAM: LEFT HIP - COMPLETE 2+ VIEW  COMPARISON:  None.  FINDINGS: Frontal pelvis as well as frontal and lateral left hip images were obtained. There is a sclerotic focus in the subcapital region of the left proximal femur which is concerning for a potential impaction type injury in this area. There is no other evidence suggesting potential fracture. No dislocation. There is moderate osteoarthritic change in both hip joints. Bones are somewhat osteoporotic.  There are seed implants in the prostate region. There are foci of arterial vascular calcification.  IMPRESSION: There is a linear sclerotic area in the subcapital femoral neck region on the left which raises concern for and impaction type injury in this area. This finding warrants further evaluation. MR would be the study of choice to further evaluate this region. If the patient has a contraindication MRI, CT would be an alternative study for evaluating this area.  Bones are osteoporotic. There is osteoarthritic change in both hip joints. No dislocation. Seed implants in prostate region.   Electronically Signed   By: Lowella Grip M.D.   On: 12/30/2013 14:51     Medications: Scheduled: . acetaminophen  1,000 mg Oral Once  . calcium acetate  2,001 mg Oral TID WC  .  ceFAZolin (ANCEF) IV  2 g Intravenous On Call to OR  . docusate sodium  100 mg Oral BID  . Influenza vac split quadrivalent PF  0.5 mL Intramuscular Tomorrow-1000  . insulin aspart  0-9 Units Subcutaneous TID WC  . multivitamin  1 tablet Oral QHS  . sodium chloride  3 mL Intravenous Q12H   Continuous:   Assessment/Plan: Principal Problem: 1. Intertrochanteric fracture of left hip- anticipate ORIF this am.  Post-op management (DVT prophylaxis, activity, diet) per Ortho.  Wife reports that he has tolerated Oxycodone in the past (despite reported allergy- confusion).   Active Problems:  2. Diabetes with ophthalmic, renal,  vascular complications- Diet-controlled.  Continue SSI. 3. HYPERTENSION-recent blood pressures have been well controlled without treatment. Has experienced some hypotension with dialysis.  4. CAD, s/p CABG 2013-aspirin and statin on hold for upcoming surgery-resume postoperatively.  5. PVD- pulses intact currently with no recent claudication symptoms.  6. ESRD (end stage renal disease) on dialysis-continue hemodialysis on Monday Wednesday and Friday. He received full treatment yesterday- will notify nephrology of admission 7. Hypotension of hemodialysis-continue Midodrine with hemodialysis  8. Disposition- patient is very motivated- will likely need skilled nursing facility for acute rehabilitation postoperatively. May be a candidate for an inpatient rehabilitation.  SW consult.  PT/OT and IR consults after surgery.   LOS: 1 day   [name] 12/31/2013, 8:03 AM

## 2013-12-31 NOTE — Anesthesia Preprocedure Evaluation (Addendum)
Anesthesia Evaluation  Patient identified by MRN, date of birth, ID band Patient awake    Reviewed: Allergy & Precautions, H&P , NPO status , Patient's Chart, lab work & pertinent test results  Airway Mallampati: II TM Distance: >3 FB Neck ROM: Full    Dental  (+) Dental Advisory Given, Teeth Intact,    Pulmonary neg pulmonary ROS,  breath sounds clear to auscultation        Cardiovascular hypertension, + CAD, + CABG and + Peripheral Vascular Disease Rhythm:Regular Rate:Normal  07/2013 TTE: EF 55-60%. Valves normal.   Neuro/Psych Depression negative neurological ROS     GI/Hepatic   Endo/Other  diabetes, Type 2  Renal/GU ESRF and DialysisRenal disease     Musculoskeletal   Abdominal   Peds  Hematology  (+) anemia ,   Anesthesia Other Findings   Reproductive/Obstetrics                        Anesthesia Physical Anesthesia Plan  ASA: III  Anesthesia Plan: General   Post-op Pain Management:    Induction: Intravenous  Airway Management Planned: Oral ETT  Additional Equipment:   Intra-op Plan:   Post-operative Plan: Extubation in OR  Informed Consent: I have reviewed the patients History and Physical, chart, labs and discussed the procedure including the risks, benefits and alternatives for the proposed anesthesia with the patient or authorized representative who has indicated his/her understanding and acceptance.   Dental advisory given  Plan Discussed with: CRNA, Anesthesiologist and Surgeon  Anesthesia Plan Comments:        Anesthesia Quick Evaluation

## 2013-12-31 NOTE — Progress Notes (Signed)
Utilization review completed.  

## 2013-12-31 NOTE — Consult Note (Signed)
[facility] Renal Consultation Note  Indication for Consultation:  Management of ESRD/hemodialysis; anemia, hypertension/volume and secondary hyperparathyroidism  HPI: [name] is a 63 y.o. male admitted with L hip fracture. He completed Hemodialysis yesterday( with out incident until end of tx) at [facility] and  Post tx lost his balance while standing for his post tx bp reading landing on his left hip. Denying  LOC,or dizziness prior to this incident. Orthopedics consulted and for surgery today.He is very active at baseline,exercisng regularly and participating in [place] walks = [employer] recently and planned to walk in [employer] before this incident . Wife in room and reports Hip pain controlled with pain meds now.    Past Medical History  Diagnosis Date  . CAD, NATIVE VESSEL 01/08/2008    a. Myoview 08/30/11 at [facility]: No scar, + mild apical, apical lateral, mid anterolateral ischemia, EF 49% => s/p CABG (L-LAD, S-OM)  . HYPERLIPIDEMIA-MIXED 01/08/2008  . OVERWEIGHT/OBESITY 01/08/2008  . HYPERTENSION 01/27/2009  . Anemia   . Blood transfusion ~ 2003  . Bruises easily   . Blind     both eyes removed   . Family history of breast cancer     mother  . Cellulitis late 1980's    "hospitalized; wrapped both legs; several times; no OR for this"  . Peripheral vascular disease   . ESRD (end stage renal disease) on dialysis     09/27/11 [facility]; Mon, Wed, Fri  . Pneumonia 2000;s  . DM type 2 (diabetes mellitus, type 2)   . Renal cell carcinoma     "both kidneys"  . H/O prostate cancer     seeds implant 03/2012  . Irregular heartbeat   . Colon polyps   . Dialysis patient     M-W-F  . Hypertension due to AV shunt     left forearm    Past Surgical History  Procedure Laterality Date  . Venectomy  1980's    "knees down; both legs; 2 separate times  . Foot amputation  ~ 2002    right;  partial; "infection"  . Cholecystectomy  1992  . Colonoscopy  06/14/2011     Procedure: COLONOSCOPY;  Surgeon: [name], MD;  Location: [facility]COPY;  Service: Endoscopy;  Laterality: N/A;  . Hot hemostasis  06/14/2011    Procedure: HOT HEMOSTASIS (ARGON PLASMA COAGULATION/BICAP);  Surgeon: [name], MD;  Location: [facility];  Service: Endoscopy;  Laterality: N/A;  . Av fistula placement  02/2010    LFA  . Varicose vein surgery  mid 1980's    BLE  . Radiofrequency ablation kidney  2010-2012    "twice; one on each side; for cancer"  . Enucleation  2003; 2006    bilateral; "diabetes; pain"  . Cardiac catheterization  ~ 2001  . Coronary artery bypass graft  09/29/2011    Procedure: CORONARY ARTERY BYPASS GRAFTING (CABG);  Surgeon: [name], MD;  Location: [facility];  Service: Open Heart Surgery;  Laterality: N/A;  Coronary Artery Bypass Graft times two utilizing the left internal mammary artery and the right greater saphenous vein harvested endoscopically.  . Colonoscopy N/A 07/24/2012    Procedure: COLONOSCOPY;  Surgeon: [name], MD;  Location: [facility];  Service: Endoscopy;  Laterality: N/A;      Family History  Problem Relation Age of Onset  . Cancer    . Coronary artery disease    . Kidney disease    . Breast cancer    .  Ovarian cancer    . Lung cancer    . Diabetes    . Cancer Mother 76    breast, spine and ovarian  . Emphysema Father   . Cancer Father 63    kidney, prostate  . Cancer Sister     ovarian  . Cancer Brother     lung  . Malignant hyperthermia Neg Hx       reports that he has never smoked. He has never used smokeless tobacco. He reports that he does not drink alcohol or use illicit drugs.   Allergies  Allergen Reactions  . Codeine Other (See Comments)    Makes patient incoherent. STATES MAKES HIM COMATOSE  . Tape Other (See Comments)    Plastic tape tears skin off, please use paper tape instead.    Prior to Admission medications   Medication Sig Start Date End Date Taking? Authorizing Provider   aspirin EC 81 MG tablet Take 81 mg by mouth daily.    Yes Historical Provider, MD  atorvastatin (LIPITOR) 10 MG tablet Take 10 mg by mouth daily.   Yes Historical Provider, MD  b complex-vitamin c-folic acid (NEPHRO-VITE) 0.8 MG TABS Take 0.8 mg by mouth at bedtime.   Yes Historical Provider, MD  calcium acetate (PHOSLO) 667 MG capsule Take 2,001 mg by mouth 3 (three) times daily with meals.    Yes Historical Provider, MD  midodrine (PROAMATINE) 5 MG tablet Take 5 mg by mouth 2 (two) times daily.   Yes Historical Provider, MD    DZH:GDJMEQ chloride, acetaminophen, acetaminophen, morphine injection, ondansetron (ZOFRAN) IV, oxyCODONE-acetaminophen, polyethylene glycol, sodium chloride, zolpidem  Results for orders placed during the hospital encounter of 12/30/13 (from the past 48 hour(s))  CBC WITH DIFFERENTIAL     Status: Abnormal   Collection Time    12/30/13  1:36 PM      Result Value Ref Range   WBC 4.3  4.0 - 10.5 K/uL   RBC 3.48 (*) 4.22 - 5.81 MIL/uL   Hemoglobin 11.5 (*) 13.0 - 17.0 g/dL   HCT 34.3 (*) 39.0 - 52.0 %   MCV 98.6  78.0 - 100.0 fL   MCH 33.0  26.0 - 34.0 pg   MCHC 33.5  30.0 - 36.0 g/dL   RDW 13.7  11.5 - 15.5 %   Platelets 108 (*) 150 - 400 K/uL   Comment: PLATELET COUNT CONFIRMED BY SMEAR     SPECIMEN CHECKED FOR CLOTS   Neutrophils Relative % 81 (*) 43 - 77 %   Neutro Abs 3.5  1.7 - 7.7 K/uL   Lymphocytes Relative 10 (*) 12 - 46 %   Lymphs Abs 0.4 (*) 0.7 - 4.0 K/uL   Monocytes Relative 6  3 - 12 %   Monocytes Absolute 0.3  0.1 - 1.0 K/uL   Eosinophils Relative 3  0 - 5 %   Eosinophils Absolute 0.1  0.0 - 0.7 K/uL   Basophils Relative 0  0 - 1 %   Basophils Absolute 0.0  0.0 - 0.1 K/uL  BASIC METABOLIC PANEL     Status: Abnormal   Collection Time    12/30/13  1:36 PM      Result Value Ref Range   Sodium 143  137 - 147 mEq/L   Potassium 4.3  3.7 - 5.3 mEq/L   Chloride 98  96 - 112 mEq/L   CO2 34 (*) 19 - 32 mEq/L   Glucose, Bld 122 (*) 70 - 99 mg/dL  BUN 38 (*) 6 - 23 mg/dL   Creatinine, Ser 4.08 (*) 0.50 - 1.35 mg/dL   Calcium 9.2  8.4 - 10.5 mg/dL   GFR calc non Af Amer 14 (*) >90 mL/min   GFR calc Af Amer 16 (*) >90 mL/min   Comment: (NOTE)     The eGFR has been calculated using the CKD EPI equation.     This calculation has not been validated in all clinical situations.     eGFR's persistently <90 mL/min signify possible Chronic Kidney     Disease.   Anion gap 11  5 - 15  I-STAT TROPOININ, ED     Status: None   Collection Time    12/30/13  1:51 PM      Result Value Ref Range   Troponin i, poc 0.01  0.00 - 0.08 ng/mL   Comment 3            Comment: Due to the release kinetics of cTnI,     a negative result within the first hours     of the onset of symptoms does not rule out     myocardial infarction with certainty.     If myocardial infarction is still suspected,     repeat the test at appropriate intervals.  GLUCOSE, CAPILLARY     Status: Abnormal   Collection Time    12/30/13  9:32 PM      Result Value Ref Range   Glucose-Capillary 132 (*) 70 - 99 mg/dL  SURGICAL PCR SCREEN     Status: None   Collection Time    12/30/13 11:19 PM      Result Value Ref Range   MRSA, PCR NEGATIVE  NEGATIVE   Staphylococcus aureus NEGATIVE  NEGATIVE   Comment:            The Xpert SA Assay (FDA     approved for NASAL specimens     in patients over 74 years of age),     is one component of     a comprehensive surveillance     program.  Test performance has     been validated by [employer] for patients greater     than or equal to 40 year old.     It is not intended     to diagnose infection nor to     guide or monitor treatment.  BASIC METABOLIC PANEL     Status: Abnormal   Collection Time    12/31/13  5:45 AM      Result Value Ref Range   Sodium 142  137 - 147 mEq/L   Potassium 4.3  3.7 - 5.3 mEq/L   Chloride 96  96 - 112 mEq/L   CO2 31  19 - 32 mEq/L   Glucose, Bld 119 (*) 70 - 99 mg/dL   BUN 56 (*) 6 - 23 mg/dL    Creatinine, Ser 5.93 (*) 0.50 - 1.35 mg/dL   Calcium 9.1  8.4 - 10.5 mg/dL   GFR calc non Af Amer 9 (*) >90 mL/min   GFR calc Af Amer 10 (*) >90 mL/min   Comment: (NOTE)     The eGFR has been calculated using the CKD EPI equation.     This calculation has not been validated in all clinical situations.     eGFR's persistently <90 mL/min signify possible Chronic Kidney     Disease.   Anion gap 15  5 - 15  CBC     Status: Abnormal   Collection Time    12/31/13  5:45 AM      Result Value Ref Range   WBC 4.8  4.0 - 10.5 K/uL   RBC 3.26 (*) 4.22 - 5.81 MIL/uL   Hemoglobin 10.7 (*) 13.0 - 17.0 g/dL   HCT 32.4 (*) 39.0 - 52.0 %   MCV 99.4  78.0 - 100.0 fL   MCH 32.8  26.0 - 34.0 pg   MCHC 33.0  30.0 - 36.0 g/dL   RDW 14.2  11.5 - 15.5 %   Platelets 97 (*) 150 - 400 K/uL   Comment: CONSISTENT WITH PREVIOUS RESULT  GLUCOSE, CAPILLARY     Status: Abnormal   Collection Time    12/31/13  6:21 AM      Result Value Ref Range   Glucose-Capillary 120 (*) 70 - 99 mg/dL    JIR:CVELFY fevers, chills , sweats, sob, chest pain, gi symptoms, and ambulating with walker  before incident. Only positives as in hpi  Physical Exam: Filed Vitals:   12/31/13 0551  BP: 109/70  Pulse: 63  Temp: 98.6 F (37 C)  Resp: 18     General: alert NAD, adult WM, appropriate HEENT: [place], MMM, Bilat enucleation Neck: supple, no jvd Heart: RRR no mur, rub, gallop Lungs: CTA bilat Abdomen:  BS pos. , soft, nontender, nondistendeind Extremities: no pedal edema Skin: No overt rash Neuro: alert OX 3, no acute focal deficit changes Dialysis Access: Pos. bruit  L arm  AVF  Dialysis Orders: Center: [facility] on 4 . EDW 98kg HD Bath 2.0 ca, 2.0 k  Time 4hr Heparin 2600. Access LUA AVF BFR 450  DFR 800     no po vit d  Aranesp 92mg  q wed hd   Units IV/HD  Venofer  50 mg q hd wed  Other  Hb op 10.7  12-29-13  Assessment/Plan 1. L Hip Fracture sp fall- Ortho plans for surgery today 2. ESRD -  HD mwf, k  4.3 3. Hypertension/volume  - Stable / using Midodrine fro bp support on hd/ no uf in am  4. Anemia  - hgb 10.7 / continue ESA q wed hd and fe q wed hd 5. Metabolic bone disease -  No hd vit d / phoslo  binder with food 6. Nutrition - renal / carb mod when eating / renal vit  7. Blind ( fairly Independent) 8. IDDM  Type 2- per admit 9. CAD sp Cabg- on statin / asa  DErnest Haber PA-C [phone]/29/2015, 9:43 AM

## 2013-12-31 NOTE — Anesthesia Postprocedure Evaluation (Signed)
  Anesthesia Post-op Note  Patient: [name]  Procedure(s) Performed: Procedure(s) with comments: CANNULATED HIP PINNING (Left) - Carm, FX Table, [employer]  Patient Location: PACU  Anesthesia Type:General  Level of Consciousness: awake, alert  and oriented  Airway and Oxygen Therapy: Patient Spontanous Breathing  Post-op Pain: none  Post-op Assessment: Post-op Vital signs reviewed  Post-op Vital Signs: Reviewed  Last Vitals:  Filed Vitals:   12/31/13 1615  BP: 138/70  Pulse: 69  Temp:   Resp: 19    Complications: No apparent anesthesia complications

## 2013-12-31 NOTE — Discharge Instructions (Signed)
Take Aspirin 325 daily for 30 days then go back to your baby aspirin daily  Bear weight as tolerated

## 2014-01-01 ENCOUNTER — Telehealth: Payer: Self-pay

## 2014-01-01 DIAGNOSIS — S72143A Displaced intertrochanteric fracture of unspecified femur, initial encounter for closed fracture: Secondary | ICD-10-CM | POA: Diagnosis not present

## 2014-01-01 LAB — CBC
HCT: 33.9 % — ABNORMAL LOW (ref 39.0–52.0)
Hemoglobin: 11.3 g/dL — ABNORMAL LOW (ref 13.0–17.0)
MCH: 33.2 pg (ref 26.0–34.0)
MCHC: 33.3 g/dL (ref 30.0–36.0)
MCV: 99.7 fL (ref 78.0–100.0)
PLATELETS: 78 10*3/uL — AB (ref 150–400)
RBC: 3.4 MIL/uL — AB (ref 4.22–5.81)
RDW: 13.9 % (ref 11.5–15.5)
WBC: 5.7 10*3/uL (ref 4.0–10.5)

## 2014-01-01 LAB — GLUCOSE, CAPILLARY
GLUCOSE-CAPILLARY: 139 mg/dL — AB (ref 70–99)
Glucose-Capillary: 114 mg/dL — ABNORMAL HIGH (ref 70–99)
Glucose-Capillary: 160 mg/dL — ABNORMAL HIGH (ref 70–99)

## 2014-01-01 LAB — BASIC METABOLIC PANEL
Anion gap: 18 — ABNORMAL HIGH (ref 5–15)
BUN: 72 mg/dL — ABNORMAL HIGH (ref 6–23)
CO2: 29 mEq/L (ref 19–32)
Calcium: 9.4 mg/dL (ref 8.4–10.5)
Chloride: 95 mEq/L — ABNORMAL LOW (ref 96–112)
Creatinine, Ser: 7.53 mg/dL — ABNORMAL HIGH (ref 0.50–1.35)
GFR, EST AFRICAN AMERICAN: 8 mL/min — AB (ref >90)
GFR, EST NON AFRICAN AMERICAN: 7 mL/min — AB (ref >90)
Glucose, Bld: 144 mg/dL — ABNORMAL HIGH (ref 70–99)
POTASSIUM: 5.3 meq/L (ref 3.7–5.3)
SODIUM: 142 meq/L (ref 137–147)

## 2014-01-01 MED ORDER — MORPHINE SULFATE 2 MG/ML IJ SOLN
INTRAMUSCULAR | Status: AC
  Filled 2014-01-01: qty 1

## 2014-01-01 MED ORDER — ALUM & MAG HYDROXIDE-SIMETH 200-200-20 MG/5ML PO SUSP
30.0000 mL | ORAL | Status: DC | PRN
  Administered 2014-01-01: 30 mL via ORAL
  Filled 2014-01-01: qty 30

## 2014-01-01 NOTE — Progress Notes (Signed)
Patient ID: [name], male   DOB: 24-Jun-1950, 63 y.o.   MRN: [id number]   [facility] Progress Note   Assessment/ Plan:   1. L Hip Fracture s/p fall- status post cannulated hip pinning yesterday and currently doing well postoperatively with minimal pain but some nausea. Anticipated mobilization with physical therapy to commence soon. 2. ESRD - HD mwf, hemodialysis ordered for today-no acute needs noted at present.  3. Hypertension/volume - Stable / using Midodrine fro bp support on hd/ no uf in am  4. Anemia - hgb 11.3 / continue weekly ESA and iron at dialysis. 5. Metabolic bone disease - he is not on vitamin D receptor analogs, continue phosphorus binder 3 times a day a.c. 6. Nutrition - renal / carb mod when eating / renal vit  7. IDDM Type 2- per admit 8. CAD sp Cabg- on statin / asa  Subjective:   Reports some nausea this morning and had occasional surgical site left hip pain.    Objective:   BP 124/69  Pulse 68  Temp(Src) 98.1 F (36.7 C) (Oral)  Resp 16  Ht 6\' 4"  (1.93 m)  Wt 97.977 kg (216 lb)  BMI 26.30 kg/m2  SpO2 100%  Physical Exam: Gen: Appears to be uncomfortable resting in bed with intermittent retching CVS: Pulse regular in rate and rhythm, S1 and S2 normal Resp: Here to auscultation bilaterally, no rales/rhonchi Abd: Soft, flat, nontender bowel sounds are normal Ext: No lower extremity edema appreciated  Labs: BMET  Recent Labs Lab 12/30/13 1336 12/31/13 0545 01/01/14 0548  NA 143 142 142  K 4.3 4.3 5.3  CL 98 96 95*  CO2 34* 31 29  GLUCOSE 122* 119* 144*  BUN 38* 56* 72*  CREATININE 4.08* 5.93* 7.53*  CALCIUM 9.2 9.1 9.4   CBC  Recent Labs Lab 12/30/13 1336 12/31/13 0545 01/01/14 0548  WBC 4.3 4.8 5.7  NEUTROABS 3.5  --   --   HGB 11.5* 10.7* 11.3*  HCT 34.3* 32.4* 33.9*  MCV 98.6 99.4 99.7  PLT 108* 97* 78*   Medications:    . acetaminophen  1,000 mg Oral Once  . aspirin EC  325 mg Oral Q breakfast  .  calcium acetate  2,001 mg Oral TID WC  . darbepoetin (ARANESP) injection - DIALYSIS  25 mcg Intravenous Q Wed-HD  . docusate sodium  100 mg Oral BID  . ferric gluconate (FERRLECIT/NULECIT) IV  62.5 mg Intravenous Q Wed-HD  . insulin aspart  0-9 Units Subcutaneous TID WC  . multivitamin  1 tablet Oral QHS  . sodium chloride  3 mL Intravenous Q12H   Elmarie Shiley, MD 01/01/2014, 8:50 AM

## 2014-01-01 NOTE — Evaluation (Addendum)
Occupational Therapy Evaluation Patient Details Name: [name] MRN: [id number] DOB: 03-15-1951 Today's Date: 01/01/2014    History of Present Illness s/p fall resulting in L hip fx and now s/p L hip pinning   Clinical Impression   Pt s/p above. Pt independent with ADLs, PTA. Feel pt will benefit from acute OT to increase independence prior to d/c. Recommending CIR for rehab.    Follow Up Recommendations  CIR    Equipment Recommendations  Other (comment) (tbd)    Recommendations for Other Services Rehab consult     Precautions / Restrictions Precautions Precautions:  (pt is blind) Precaution Comments: HD MWF Restrictions Weight Bearing Restrictions: Yes LLE Weight Bearing: Weight bearing as tolerated      Mobility Bed Mobility   General bed mobility comments: not assessed  Transfers Overall transfer level: Needs assistance Equipment used: Rolling walker (2 wheeled) Transfers: Sit to/from Stand Sit to Stand: Max assist        General transfer comment: cues for technique. Heavy assist.         ADL Overall ADL's : Needs assistance/impaired Eating/Feeding: Independent;Sitting   Grooming: Set up;Sitting   Upper Body Bathing: Set up;Sitting   Lower Body Bathing: +2 for physical assistance;Maximal assistance;Sit to/from stand   Upper Body Dressing : Set up;Sitting   Lower Body Dressing: +2 for physical assistance;Maximal assistance;Sit to/from stand   Toilet Transfer: Maximal assistance (sit to stand transfer from chair)   Toileting- Clothing Manipulation and Hygiene: +2 for physical assistance;Maximal assistance;Sit to/from stand         General ADL Comments: Educated on AE for LB ADLs and pt practiced with reacher and sockaid. Educated on dressing technique. Educated on Stage manager. Performed UE theraband exercises in chair. Briefly talked about CIR. Discussed alternative techniques for LB ADLs (leaning/rolling).     Vision                     Perception     Praxis      Pertinent Vitals/Pain Pain Assessment: Faces Pain Score: 6  Faces Pain Scale: Hurts whole lot Pain Location: left hip Pain Descriptors / Indicators: Moaning Pain Intervention(s): Limited activity within patient's tolerance;Repositioned     Hand Dominance     Extremity/Trunk Assessment Upper Extremity Assessment Upper Extremity Assessment: Overall WFL for tasks assessed   Lower Extremity Assessment Lower Extremity Assessment: Defer to PT evaluation RLE:  (h/o R transmet amputation x 13 years ago) LLE Deficits / Details: grossly 2 to 3-/5 limited due to pain and post op weakness.        Communication Communication Communication: No difficulties   Cognition Arousal/Alertness: Awake/alert Behavior During Therapy: WFL for tasks assessed/performed Overall Cognitive Status: Within Functional Limits for tasks assessed                     General Comments       Exercises Exercises: Other exercises Other Exercises Other Exercises: UE exercises with theraband (blue-level 4)   Shoulder Instructions      Home Living Family/patient expects to be discharged to:: Inpatient rehab Living Arrangements: Spouse/significant other Available Help at Discharge: Family Type of Home: House Home Access: Ramped entrance     Home Layout: One level               Home Equipment: Walker - 4 wheels;Grab bars - tub/shower   Additional Comments: pt has tub/shower and elevated toilet      Prior Functioning/Environment Level of Independence:  Independent with assistive device(s)        Comments: Pt reports working out daily including HD days.    OT Diagnosis: Acute pain   OT Problem List: Decreased strength;Decreased activity tolerance;Decreased range of motion;Decreased knowledge of use of DME or AE;Decreased knowledge of precautions;Pain   OT Treatment/Interventions: Self-care/ADL training;DME and/or AE instruction;Therapeutic  activities;Patient/family education;Balance training;Therapeutic exercise    OT Goals(Current goals can be found in the care plan section) Acute Rehab OT Goals Patient Stated Goal: not stated OT Goal Formulation: With patient/family Time For Goal Achievement: 01/15/14 Potential to Achieve Goals: Good ADL Goals Pt Will Perform Lower Body Bathing: with min assist;with adaptive equipment;sit to/from stand Pt Will Perform Lower Body Dressing: with min assist;with adaptive equipment;sit to/from stand Pt Will Transfer to Toilet: with min assist;ambulating;bedside commode Pt Will Perform Toileting - Clothing Manipulation and hygiene: with min assist;sit to/from stand;sitting/lateral leans Additional ADL Goal #1: Pt will be independent with HEP for bilateral UE's.  OT Frequency: Min 2X/week   Barriers to D/C:            Co-evaluation              End of Session Equipment Utilized During Treatment: Gait belt;Rolling walker  Activity Tolerance: Other (comment);Patient limited by pain (nausea) Patient left: in chair;with call bell/phone within reach;with family/visitor present   Time: [id number] OT Time Calculation (min): 29 min Charges:  OT General Charges $OT Visit: 1 Procedure OT Evaluation $Initial OT Evaluation Tier I: 1 Procedure OT Treatments $Self Care/Home Management : 8-22 mins G-Codes:    Benito Mccreedy OTR/L [id number] 01/01/2014, 1:32 PM

## 2014-01-01 NOTE — Telephone Encounter (Signed)
Patient was admitted to [facility]. He is s/p left hip pinning. The colonoscopy scheduled for 01/07/14 as an outpatient at [facility] is cancelled. He will be contacted in 3 to 4 month to reschedule the procedure. Information per Azucena Freed PA.

## 2014-01-01 NOTE — Consult Note (Signed)
Physical Medicine and Rehabilitation Consult  Reason for Consult: Left hip fracture Referring Physician: Dr. Brigitte Pulse   HPI: [name] is a 63 y.o. male with history of CAD, DM type 2, bilateral enucleation, ESRD--HD, orthostatic hypotension; who was admitted on 12/30/13 past fall at HD center. He had completed his dialysis session, stood up to have d BP checked, turned around and didn't have anything to hold onto therefore lost his balance and fell onto his left hip with immediate onset of left hip pain. Xray revealed left femoral neck fracture and patient taken to OR 09/29 for left hip pinning by Dr. Alain Marion. Post op to be WBAT and on ASA 325 mg X 1 month for DVT prophylaxis. PT evalution done today and patient required +2 assist with pain limiting mobility. PT/MD recommending CIR as patient active and independent PTA.    Review of Systems  HENT: Negative for hearing loss.   Eyes:       Blind  Respiratory: Negative for cough, shortness of breath and wheezing.   Cardiovascular: Negative for chest pain and palpitations.  Gastrointestinal: Negative for heartburn, nausea and abdominal pain.  Musculoskeletal: Positive for joint pain. Negative for myalgias.       Sacral pain.  Neurological: Negative for dizziness, tingling, focal weakness and headaches.       Impaired balance.  Psychiatric/Behavioral: Negative for depression. The patient is not nervous/anxious.      Past Medical History  Diagnosis Date  . CAD, NATIVE VESSEL 01/08/2008    a. Myoview 08/30/11 at [facility]: No scar, + mild apical, apical lateral, mid anterolateral ischemia, EF 49% => s/p CABG (L-LAD, S-OM)  . HYPERLIPIDEMIA-MIXED 01/08/2008  . OVERWEIGHT/OBESITY 01/08/2008  . HYPERTENSION 01/27/2009  . Anemia   . Blood transfusion ~ 2003  . Bruises easily   . Blind     both eyes removed   . Family history of breast cancer     mother  . Cellulitis late 1980's    "hospitalized; wrapped both legs; several times; no OR  for this"  . Peripheral vascular disease   . ESRD (end stage renal disease) on dialysis     09/27/11 [facility]; Mon, Wed, Fri  . Pneumonia 2000;s  . DM type 2 (diabetes mellitus, type 2)   . Renal cell carcinoma     "both kidneys"  . H/O prostate cancer     seeds implant 03/2012  . Irregular heartbeat   . Colon polyps   . Dialysis patient     M-W-F  . Hypertension due to AV shunt     left forearm    Past Surgical History  Procedure Laterality Date  . Venectomy  1980's    "knees down; both legs; 2 separate times  . Foot amputation  ~ 2002    right;  partial; "infection"  . Cholecystectomy  1992  . Colonoscopy  06/14/2011    Procedure: COLONOSCOPY;  Surgeon: [name], MD;  Location: [facility];  Service: Endoscopy;  Laterality: N/A;  . Hot hemostasis  06/14/2011    Procedure: HOT HEMOSTASIS (ARGON PLASMA COAGULATION/BICAP);  Surgeon: [name], MD;  Location: [facility];  Service: Endoscopy;  Laterality: N/A;  . Av fistula placement  02/2010    LFA  . Varicose vein surgery  mid 1980's    BLE  . Radiofrequency ablation kidney  2010-2012    "twice; one on each side; for cancer"  . Enucleation  2003; 2006  bilateral; "diabetes; pain"  . Cardiac catheterization  ~ 2001  . Coronary artery bypass graft  09/29/2011    Procedure: CORONARY ARTERY BYPASS GRAFTING (CABG);  Surgeon: [name], MD;  Location: [facility];  Service: Open Heart Surgery;  Laterality: N/A;  Coronary Artery Bypass Graft times two utilizing the left internal mammary artery and the right greater saphenous vein harvested endoscopically.  . Colonoscopy N/A 07/24/2012    Procedure: COLONOSCOPY;  Surgeon: [name], MD;  Location: [facility];  Service: Endoscopy;  Laterality: N/A;    Family History  Problem Relation Age of Onset  . Cancer    . Coronary artery disease    . Kidney disease    . Breast cancer    . Ovarian cancer    . Lung cancer    . Diabetes    . Cancer Mother 110     breast, spine and ovarian  . Emphysema Father   . Cancer Father 52    kidney, prostate  . Cancer Sister     ovarian  . Cancer Brother     lung  . Malignant hyperthermia Neg Hx     Social History:  Married. Retired Furniture conservator/restorer. Independent with walker PTA. Does 90 minute exercise regimen 6 days/week. Wife provides 24 hours supervision. He reports that he has never smoked. He has never used smokeless tobacco. He reports that he does not drink alcohol or use illicit drugs.   Allergies  Allergen Reactions  . Codeine Other (See Comments)    Makes patient incoherent. STATES MAKES HIM COMATOSE  . Tape Other (See Comments)    Plastic tape tears skin off, please use paper tape instead.   Medications Prior to Admission  Medication Sig Dispense Refill  . aspirin EC 81 MG tablet Take 81 mg by mouth daily.       [name] atorvastatin (LIPITOR) 10 MG tablet Take 10 mg by mouth daily.      [name] b complex-vitamin c-folic acid (NEPHRO-VITE) 0.8 MG TABS Take 0.8 mg by mouth at bedtime.      . calcium acetate (PHOSLO) 667 MG capsule Take 2,001 mg by mouth 3 (three) times daily with meals.       . midodrine (PROAMATINE) 5 MG tablet Take 5 mg by mouth 2 (two) times daily.        Home: Home Living Family/patient expects to be discharged to:: Private residence Living Arrangements: Spouse/significant other  Functional History:   Functional Status:  Mobility:          ADL:    Cognition: Cognition Orientation Level: Oriented X4    Blood pressure 124/69, pulse 68, temperature 98.1 F (36.7 C), temperature source Oral, resp. rate 16, height 6\' 4"  (1.93 m), weight 97.977 kg (216 lb), SpO2 100.00%. Physical Exam  Nursing note and vitals reviewed. Constitutional: He is oriented to person, place, and time. He appears well-developed and well-nourished.  HENT:  Head: Normocephalic and atraumatic.  Eyes:  Keeps eyes closed  Neck: Neck supple.  Cardiovascular: Normal rate and regular rhythm.     Respiratory: Effort normal and breath sounds normal. No respiratory distress. He has no wheezes.  GI: Soft. Bowel sounds are normal. He exhibits no distension. There is no tenderness.  Musculoskeletal:  Left hip pain with ROM but able to activate at knee and hip minimally. Moves BUE/RLE without difficulty.   Neurological: He is alert and oriented to person, place, and time.  Skin: Skin is warm and dry.    Results  for orders placed during the hospital encounter of 12/30/13 (from the past 24 hour(s))  GLUCOSE, CAPILLARY     Status: Abnormal   Collection Time    12/31/13 11:09 AM      Result Value Ref Range   Glucose-Capillary 104 (*) 70 - 99 mg/dL  GLUCOSE, CAPILLARY     Status: None   Collection Time    12/31/13  3:27 PM      Result Value Ref Range   Glucose-Capillary 86  70 - 99 mg/dL   Comment 1 Documented in Chart     Comment 2 Notify RN    GLUCOSE, CAPILLARY     Status: Abnormal   Collection Time    12/31/13  4:33 PM      Result Value Ref Range   Glucose-Capillary 101 (*) 70 - 99 mg/dL  GLUCOSE, CAPILLARY     Status: Abnormal   Collection Time    12/31/13  9:30 PM      Result Value Ref Range   Glucose-Capillary 149 (*) 70 - 99 mg/dL  BASIC METABOLIC PANEL     Status: Abnormal   Collection Time    01/01/14  5:48 AM      Result Value Ref Range   Sodium 142  137 - 147 mEq/L   Potassium 5.3  3.7 - 5.3 mEq/L   Chloride 95 (*) 96 - 112 mEq/L   CO2 29  19 - 32 mEq/L   Glucose, Bld 144 (*) 70 - 99 mg/dL   BUN 72 (*) 6 - 23 mg/dL   Creatinine, Ser 7.53 (*) 0.50 - 1.35 mg/dL   Calcium 9.4  8.4 - 10.5 mg/dL   GFR calc non Af Amer 7 (*) >90 mL/min   GFR calc Af Amer 8 (*) >90 mL/min   Anion gap 18 (*) 5 - 15  CBC     Status: Abnormal   Collection Time    01/01/14  5:48 AM      Result Value Ref Range   WBC 5.7  4.0 - 10.5 K/uL   RBC 3.40 (*) 4.22 - 5.81 MIL/uL   Hemoglobin 11.3 (*) 13.0 - 17.0 g/dL   HCT 33.9 (*) 39.0 - 52.0 %   MCV 99.7  78.0 - 100.0 fL   MCH 33.2   26.0 - 34.0 pg   MCHC 33.3  30.0 - 36.0 g/dL   RDW 13.9  11.5 - 15.5 %   Platelets 78 (*) 150 - 400 K/uL  GLUCOSE, CAPILLARY     Status: Abnormal   Collection Time    01/01/14  6:41 AM      Result Value Ref Range   Glucose-Capillary 114 (*) 70 - 99 mg/dL   Dg Chest 2 View  12/30/2013   CLINICAL DATA:  Fall at dialysis. Left hip pain. History of hypertension.  EXAM: CHEST  2 VIEW  COMPARISON:  10/25/2011  FINDINGS: Patient has had median sternotomy. Heart size is normal. The lungs are clear. No edema. No acute, displaced fractures.  IMPRESSION: No evidence for acute  abnormality.   Electronically Signed   By: Shon Hale M.D.   On: 12/30/2013 15:53   Dg Hip Complete Left  12/30/2013   CLINICAL DATA:  Pain post trauma  EXAM: LEFT HIP - COMPLETE 2+ VIEW  COMPARISON:  None.  FINDINGS: Frontal pelvis as well as frontal and lateral left hip images were obtained. There is a sclerotic focus in the subcapital region of the left proximal femur which is  concerning for a potential impaction type injury in this area. There is no other evidence suggesting potential fracture. No dislocation. There is moderate osteoarthritic change in both hip joints. Bones are somewhat osteoporotic.  There are seed implants in the prostate region. There are foci of arterial vascular calcification.  IMPRESSION: There is a linear sclerotic area in the subcapital femoral neck region on the left which raises concern for and impaction type injury in this area. This finding warrants further evaluation. MR would be the study of choice to further evaluate this region. If the patient has a contraindication MRI, CT would be an alternative study for evaluating this area.  Bones are osteoporotic. There is osteoarthritic change in both hip joints. No dislocation. Seed implants in prostate region.   Electronically Signed   By: Lowella Grip M.D.   On: 12/30/2013 14:51   Dg Hip Operative Left  12/31/2013   CLINICAL DATA:  Hip fixation.   EXAM: OPERATIVE LEFT HIP  COMPARISON:  12/30/2013  FINDINGS: Two intraoperative views which demonstrate screw fixation of the femoral neck. Alignment is unchanged, and no acute hardware complication is identified. Radiation seeds in the prostate.  IMPRESSION: Intraoperative imaging of screw fixation, proximal left femur.   Electronically Signed   By: Abigail Miyamoto M.D.   On: 12/31/2013 15:00   Dg Pelvis Portable  12/31/2013   CLINICAL DATA:  Post left hip screw placement  EXAM: PORTABLE PELVIS 1-2 VIEWS  COMPARISON:  Left hip radiographs - 12/30/2013 ; intraoperative radiographs of the left hip - earlier same day  FINDINGS: The patient has undergone lag screw fixation of the left femoral neck with 3 transfixing lag screws. Alignment appears near anatomic given obliquity. There is a minimal amount of expected subcutaneous emphysema about the operative site. No radiopaque foreign body.  Mild degenerative change of the bilateral hips is suspected. Brachytherapy seeds overlie the expected location of the prostate gland. Vascular calcifications.  IMPRESSION: Post lag screw fixation of the left femoral neck without evidence of complication.   Electronically Signed   By: Sandi Mariscal M.D.   On: 12/31/2013 15:30    Assessment/Plan: Diagnosis: left femoral neck fracture 1. Does the need for close, 24 hr/day medical supervision in concert with the patient's rehab needs make it unreasonable for this patient to be served in a less intensive setting? Yes 2. Co-Morbidities requiring supervision/potential complications: cad, esrd, htn 3. Due to bladder management, bowel management, safety, skin/wound care, disease management, medication administration, pain management and patient education, does the patient require 24 hr/day rehab nursing? Yes 4. Does the patient require coordinated care of a physician, rehab nurse, PT (1-2 hrs/day, 5 days/week) and OT (1-2 hrs/day, 5 days/week) to address physical and functional deficits  in the context of the above medical diagnosis(es)? Yes Addressing deficits in the following areas: balance, endurance, locomotion, strength, transferring, bowel/bladder control, bathing, dressing, feeding, grooming, toileting and psychosocial support 5. Can the patient actively participate in an intensive therapy program of at least 3 hrs of therapy per day at least 5 days per week? Yes 6. The potential for patient to make measurable gains while on inpatient rehab is excellent 7. Anticipated functional outcomes upon discharge from inpatient rehab are supervision and min assist  with PT, supervision and min assist with OT, n/a with SLP. 8. Estimated rehab length of stay to reach the above functional goals is: 12-15 days 9. Does the patient have adequate social supports to accommodate these discharge functional goals? Yes 10. Anticipated D/C  setting: Home 11. Anticipated post D/C treatments: HH therapy and Outpatient therapy 12. Overall Rehab/Functional Prognosis: excellent  RECOMMENDATIONS: This patient's condition is appropriate for continued rehabilitative care in the following setting: CIR Patient has agreed to participate in recommended program. Yes and Potentially Note that insurance prior authorization may be required for reimbursement for recommended care.  Comment: Rehab Admissions Coordinator to follow up.  Thanks,  Meredith Staggers, MD, Mellody Drown     01/01/2014

## 2014-01-01 NOTE — Progress Notes (Signed)
Subjective: Tolerated cannulated hip pinning yesterday.  Developed post-op nausea, vomitting and continues to feel nauseated this am despite zofran.  Hip pain controlled with Vicodin but having coccyx pain which he attributes to lying in bed.  Objective: Vital signs in last 24 hours: Temp:  [97.8 F (36.6 C)-98.6 F (37 C)] 98.1 F (36.7 C) (09/30 0558) Pulse Rate:  [62-76] 68 (09/30 0558) Resp:  [11-24] 16 (09/30 0558) BP: (113-142)/(60-74) 124/69 mmHg (09/30 0558) SpO2:  [89 %-100 %] 100 % (09/30 0558) Weight change:  Last BM Date: 12/28/13  CBG (last 3)   Recent Labs  12/31/13 1633 12/31/13 2130 01/01/14 0641  GLUCAP 101* 149* 114*    Intake/Output from previous day: 09/29 0701 - 09/30 0700 In: 600 [P.O.:100; I.V.:400; IV Piggyback:100] Out: 30 [Blood:30] Intake/Output this shift:    General appearance: fatigued Eyes: no scleral icterus Throat: oropharynx moist without erythema Resp: clear to auscultation bilaterally Cardio: regular rate and rhythm GI: soft, non-tender; bowel sounds normal; no masses,  no organomegaly Extremities: no clubbing, cyanosis or edema; SCDs in place   Lab Results:  Recent Labs  12/31/13 0545 01/01/14 0548  NA 142 142  K 4.3 5.3  CL 96 95*  CO2 31 29  GLUCOSE 119* 144*  BUN 56* 72*  CREATININE 5.93* 7.53*  CALCIUM 9.1 9.4   No results found for this basename: AST, ALT, ALKPHOS, BILITOT, PROT, ALBUMIN,  in the last 72 hours  Recent Labs  12/30/13 1336 12/31/13 0545 01/01/14 0548  WBC 4.3 4.8 5.7  NEUTROABS 3.5  --   --   HGB 11.5* 10.7* 11.3*  HCT 34.3* 32.4* 33.9*  MCV 98.6 99.4 99.7  PLT 108* 97* 78*   Lab Results  Component Value Date   INR 1.26 09/29/2011   INR 1.11 09/28/2011   INR 1.0 09/21/2011   No results found for this basename: CKTOTAL, CKMB, CKMBINDEX, TROPONINI,  in the last 72 hours No results found for this basename: TSH, T4TOTAL, FREET3, T3FREE, THYROIDAB,  in the last 72 hours No results found for  this basename: VITAMINB12, FOLATE, FERRITIN, TIBC, IRON, RETICCTPCT,  in the last 72 hours  Studies/Results: Dg Chest 2 View  12/30/2013   CLINICAL DATA:  Fall at dialysis. Left hip pain. History of hypertension.  EXAM: CHEST  2 VIEW  COMPARISON:  10/25/2011  FINDINGS: Patient has had median sternotomy. Heart size is normal. The lungs are clear. No edema. No acute, displaced fractures.  IMPRESSION: No evidence for acute  abnormality.   Electronically Signed   By: Shon Hale M.D.   On: 12/30/2013 15:53   Dg Hip Complete Left  12/30/2013   CLINICAL DATA:  Pain post trauma  EXAM: LEFT HIP - COMPLETE 2+ VIEW  COMPARISON:  None.  FINDINGS: Frontal pelvis as well as frontal and lateral left hip images were obtained. There is a sclerotic focus in the subcapital region of the left proximal femur which is concerning for a potential impaction type injury in this area. There is no other evidence suggesting potential fracture. No dislocation. There is moderate osteoarthritic change in both hip joints. Bones are somewhat osteoporotic.  There are seed implants in the prostate region. There are foci of arterial vascular calcification.  IMPRESSION: There is a linear sclerotic area in the subcapital femoral neck region on the left which raises concern for and impaction type injury in this area. This finding warrants further evaluation. MR would be the study of choice to further evaluate this region. If  the patient has a contraindication MRI, CT would be an alternative study for evaluating this area.  Bones are osteoporotic. There is osteoarthritic change in both hip joints. No dislocation. Seed implants in prostate region.   Electronically Signed   By: Lowella Grip M.D.   On: 12/30/2013 14:51   Dg Hip Operative Left  12/31/2013   CLINICAL DATA:  Hip fixation.  EXAM: OPERATIVE LEFT HIP  COMPARISON:  12/30/2013  FINDINGS: Two intraoperative views which demonstrate screw fixation of the femoral neck. Alignment is  unchanged, and no acute hardware complication is identified. Radiation seeds in the prostate.  IMPRESSION: Intraoperative imaging of screw fixation, proximal left femur.   Electronically Signed   By: Abigail Miyamoto M.D.   On: 12/31/2013 15:00   Dg Pelvis Portable  12/31/2013   CLINICAL DATA:  Post left hip screw placement  EXAM: PORTABLE PELVIS 1-2 VIEWS  COMPARISON:  Left hip radiographs - 12/30/2013 ; intraoperative radiographs of the left hip - earlier same day  FINDINGS: The patient has undergone lag screw fixation of the left femoral neck with 3 transfixing lag screws. Alignment appears near anatomic given obliquity. There is a minimal amount of expected subcutaneous emphysema about the operative site. No radiopaque foreign body.  Mild degenerative change of the bilateral hips is suspected. Brachytherapy seeds overlie the expected location of the prostate gland. Vascular calcifications.  IMPRESSION: Post lag screw fixation of the left femoral neck without evidence of complication.   Electronically Signed   By: Sandi Mariscal M.D.   On: 12/31/2013 15:30     Medications: Scheduled: . acetaminophen  1,000 mg Oral Once  . aspirin EC  325 mg Oral Q breakfast  . calcium acetate  2,001 mg Oral TID WC  . darbepoetin (ARANESP) injection - DIALYSIS  25 mcg Intravenous Q Wed-HD  . docusate sodium  100 mg Oral BID  . ferric gluconate (FERRLECIT/NULECIT) IV  62.5 mg Intravenous Q Wed-HD  . Influenza vac split quadrivalent PF  0.5 mL Intramuscular Tomorrow-1000  . insulin aspart  0-9 Units Subcutaneous TID WC  . multivitamin  1 tablet Oral QHS  . sodium chloride  3 mL Intravenous Q12H   Continuous:   Assessment/Plan: Principal Problem:  1. Intertrochanteric fracture of left hip s/p cannulated hip pinning (POD #1)- PT/OT consults ordered.  On SCDs, ASA for DVT prophylaxis per Ortho.  Continue IS for mild post-op hypoxia. Active Problems:  2. Diabetes with ophthalmic, renal, vascular complications-  Diet-controlled. Continue SSI.  3. HYPERTENSION-recent blood pressures have been well controlled without treatment. Has experienced some hypotension with dialysis.  4. CAD, s/p CABG 2013-resume statin on discharge.  ASA restarted 5. PVD- pulses intact currently with no recent claudication symptoms.  6. ESRD (end stage renal disease) on dialysis-anticipate HD today.  Hopefully this will help with nausea which is likely related to medications/anesthesia. 7. Hypotension of hemodialysis-continue Midodrine with hemodialysis  8. Disposition- patient is very motivated- will likely need acute rehabilitation. May be a candidate for an inpatient rehabilitation. SW/CM consulted. PT/OT pending. Will consult inpatient rehab.  Anticipate he will be ready for discharge Friday after dialysis if nausea improves and progressing postoperatively.   LOS: 2 days   [name] 01/01/2014, 8:01 AM

## 2014-01-01 NOTE — Progress Notes (Signed)
@   1400 patient complaining of severe chest pain in mid chest that does not radiate. EKG and vital signs were obtained; both were stable. Patient also with hiccups and states "feels like heartburn maybe." MD was notified and order for mylanta was written. RN administered mylanta along with a PRN dose of Morphine. Patient now resting comfortably in chair. Nursing will continue monitor.

## 2014-01-01 NOTE — Progress Notes (Signed)
Hemodialysis- Pt dialyzing in recliner, stated he could not tolerate bed. Pt signed off after 3 hours tx d/t pain and being uncomfortable in chair. Previously medicated, bp on hd low. Verbal consent to end treatment verified with 2 RNs d/t pt being unable to sign. Pt requests to be brought back to his room "If I get out of this chair I will be ok."

## 2014-01-01 NOTE — Care Management Note (Signed)
CARE MANAGEMENT NOTE 01/01/2014  Patient:  [name], [name]   Account Number:  [id number]  Date Initiated:  01/01/2014  Documentation initiated by:  Ricki Miller  Subjective/Objective Assessment:   63 yr old male admitted with left hip fracture, Left hip pinning performed.     Action/Plan:   Patient to be evaluated for CIR, Case manager will follow.   Anticipated DC Date:     Anticipated DC Plan:        DC Planning Services  CM consult      Choice offered to / List presented to:             Status of service:  In process, will continue to follow  Per UR Regulation:  Reviewed for med. necessity/level of care/duration of stay

## 2014-01-01 NOTE — Evaluation (Signed)
Physical Therapy Evaluation Patient Details Name: [name] MRN: [id number] DOB: 1950-06-03 Today's Date: 01/01/2014   History of Present Illness  s/p fall resulting in L hip fx and now s/p L hip pinning  Clinical Impression  Pt presents with significant pain, decreased activity tolerance, and weakness s/p L hip pinning. Pt requiring +2 assist for bed mobility and transfer at this time using RW. Pt will benefit from skilled PT intervention to address impairments below and increase functional independence. Pt was very active PTA.    Follow Up Recommendations CIR;Supervision/Assistance - 24 hour    Equipment Recommendations  Other (comment) (Pt has personal rollator. Monitor if appropriate at d/c)    Recommendations for Other Services Rehab consult     Precautions / Restrictions Precautions Precautions: Other (comment) (Pt is blind) Precaution Comments: HD MWF Restrictions Weight Bearing Restrictions: Yes LLE Weight Bearing: Weight bearing as tolerated      Mobility  Bed Mobility Overal bed mobility: +2 for physical assistance;Needs Assistance Bed Mobility: Sidelying to Sit;Supine to Sit   Sidelying to sit: +2 for physical assistance;Max assist Supine to sit: Max assist;+2 for physical assistance     General bed mobility comments: increased time due to increased pain in back and LLE; cues for technique and encouragement provided. Pt also with O2 sats dropping as pt holding his breath but increased >90% once cued for pursed lip breathing  Transfers Overall transfer level: Needs assistance Equipment used: Rolling walker (2 wheeled) Transfers: Sit to/from [facility] Sit to Stand: +2 physical assistance;Mod assist Stand pivot transfers: Mod assist;+2 physical assistance       General transfer comment: Cues for hand placement and posture. Once upright, required min A for balance  Ambulation/Gait             General Gait Details: unable to  tolerate on evaluation  Stairs            Wheelchair Mobility    Modified Rankin (Stroke Patients Only)       Balance Overall balance assessment: Needs assistance   Sitting balance-Leahy Scale: Poor Sitting balance - Comments: requires UE support during sitting and decreased WB on L   Standing balance support: Bilateral upper extremity supported Standing balance-Leahy Scale: Poor Standing balance comment: Pt reports h/o poor balance requiring constant use of UE's for support/rollator at home                             Pertinent Vitals/Pain Pain Assessment: 0-10 Pain Score: 6  Pain Location: L hip and back Pain Descriptors / Indicators: Aching;Guarding;Moaning Pain Intervention(s): Limited activity within patient's tolerance;Patient requesting pain meds-RN notified;RN gave pain meds during session;Repositioned;Monitored during session    Home Living Family/patient expects to be discharged to:: Inpatient rehab Living Arrangements: Spouse/significant other Available Help at Discharge: Family Type of Home: House Home Access: Ramped entrance     Home Layout: One level Home Equipment: Walker - 4 wheels      Prior Function Level of Independence: Independent with assistive device(s) (used rollator)         Comments: Pt reports working out daily including HD days.     Hand Dominance        Extremity/Trunk Assessment   Upper Extremity Assessment: Defer to OT evaluation           Lower Extremity Assessment: LLE deficits/detail   LLE Deficits / Details: grossly 2 to 3-/5 limited due to pain and  post op weakness.      Communication      Cognition Arousal/Alertness: Awake/alert Behavior During Therapy: WFL for tasks assessed/performed Overall Cognitive Status: Within Functional Limits for tasks assessed                      General Comments General comments (skin integrity, edema, etc.): Pt very limited by pain and decreased  activity tolerance. O2 monitored during mobility as decreased with mobility but able to resume >  90% with cues for pursed lip breathing. Encouragement provided throughout session as well.    Exercises General Exercises - Lower Extremity Hip Flexion/Marching: 10 reps;Standing      Assessment/Plan    PT Assessment Patient needs continued PT services  PT Diagnosis Difficulty walking;Acute pain;Generalized weakness   PT Problem List Decreased strength;Decreased range of motion;Decreased activity tolerance;Decreased balance;Decreased mobility;Decreased knowledge of use of DME;Pain  PT Treatment Interventions DME instruction;Gait training;Functional mobility training;Stair training;Therapeutic activities;Therapeutic exercise;Balance training;Patient/family education;Neuromuscular re-education   PT Goals (Current goals can be found in the Care Plan section) Acute Rehab PT Goals Patient Stated Goal: go to rehab PT Goal Formulation: With patient/family Time For Goal Achievement: 01/08/14 Potential to Achieve Goals: Good    Frequency Min 5X/week   Barriers to discharge        Co-evaluation               End of Session Equipment Utilized During Treatment: Gait belt;Oxygen (3L) Activity Tolerance: Patient limited by pain Patient left: in chair;with call bell/phone within reach;with family/visitor present Nurse Communication: Mobility status;Weight bearing status         Time: [id number] PT Time Calculation (min): 55 min   Charges:   PT Evaluation $Initial PT Evaluation Tier I: 1 Procedure PT Treatments $Therapeutic Activity: 38-52 mins   PT G Codes:          [name] 01/01/2014, 11:06 AM

## 2014-01-01 NOTE — Progress Notes (Signed)
Subjective:  Patient reports pain as mild  Objective:   VITALS:   Filed Vitals:   01/01/14 0000 01/01/14 0112 01/01/14 0400 01/01/14 0558  BP:  130/60  124/69  Pulse:  71  68  Temp:  97.9 F (36.6 C)  98.1 F (36.7 C)  TempSrc:      Resp: 17 16 17 16   Height:      Weight:      SpO2: 96% 99% 97% 100%    Physical Exam  Dressing: C/D/I  Compartments soft  SILT DP/SP/S/S/T, 2+DP, +TA/GS/EHL  LABS  Results for orders placed during the hospital encounter of 12/30/13 (from the past 24 hour(s))  GLUCOSE, CAPILLARY     Status: Abnormal   Collection Time    12/31/13 11:09 AM      Result Value Ref Range   Glucose-Capillary 104 (*) 70 - 99 mg/dL  GLUCOSE, CAPILLARY     Status: None   Collection Time    12/31/13  3:27 PM      Result Value Ref Range   Glucose-Capillary 86  70 - 99 mg/dL   Comment 1 Documented in Chart     Comment 2 Notify RN    GLUCOSE, CAPILLARY     Status: Abnormal   Collection Time    12/31/13  4:33 PM      Result Value Ref Range   Glucose-Capillary 101 (*) 70 - 99 mg/dL  GLUCOSE, CAPILLARY     Status: Abnormal   Collection Time    12/31/13  9:30 PM      Result Value Ref Range   Glucose-Capillary 149 (*) 70 - 99 mg/dL  BASIC METABOLIC PANEL     Status: Abnormal   Collection Time    01/01/14  5:48 AM      Result Value Ref Range   Sodium 142  137 - 147 mEq/L   Potassium 5.3  3.7 - 5.3 mEq/L   Chloride 95 (*) 96 - 112 mEq/L   CO2 29  19 - 32 mEq/L   Glucose, Bld 144 (*) 70 - 99 mg/dL   BUN 72 (*) 6 - 23 mg/dL   Creatinine, Ser 7.53 (*) 0.50 - 1.35 mg/dL   Calcium 9.4  8.4 - 10.5 mg/dL   GFR calc non Af Amer 7 (*) >90 mL/min   GFR calc Af Amer 8 (*) >90 mL/min   Anion gap 18 (*) 5 - 15  CBC     Status: Abnormal   Collection Time    01/01/14  5:48 AM      Result Value Ref Range   WBC 5.7  4.0 - 10.5 K/uL   RBC 3.40 (*) 4.22 - 5.81 MIL/uL   Hemoglobin 11.3 (*) 13.0 - 17.0 g/dL   HCT 33.9 (*) 39.0 - 52.0 %   MCV 99.7  78.0 - 100.0 fL   MCH 33.2  26.0 - 34.0 pg   MCHC 33.3  30.0 - 36.0 g/dL   RDW 13.9  11.5 - 15.5 %   Platelets 78 (*) 150 - 400 K/uL  GLUCOSE, CAPILLARY     Status: Abnormal   Collection Time    01/01/14  6:41 AM      Result Value Ref Range   Glucose-Capillary 114 (*) 70 - 99 mg/dL     Assessment/Plan: 1 Day Post-Op   Principal Problem:   Intertrochanteric fracture of left hip Active Problems:   DIAB W/UNS COMP TYPE II/UNS NOT STATED UNCNTRL   HYPERTENSION  CAD, s/p CABG 2013   PVD   ESRD (end stage renal disease) on dialysis   Hypotension of hemodialysis   Dialysis patient   Hip fracture   PLAN: Weight Bearing: WBAT Dressings: intact until f/u VTE prophylaxis: SCD's, ambulation, ASA325 for 30 days then back to ASA 81 Dispo: pending mobilization with PT.    [name], D 01/01/2014, 8:38 AM   Edmonia Lynch, MD Cell [phone]

## 2014-01-02 ENCOUNTER — Encounter (HOSPITAL_COMMUNITY): Payer: Self-pay | Admitting: Orthopedic Surgery

## 2014-01-02 LAB — CBC
HCT: 29.9 % — ABNORMAL LOW (ref 39.0–52.0)
Hemoglobin: 10 g/dL — ABNORMAL LOW (ref 13.0–17.0)
MCH: 33.7 pg (ref 26.0–34.0)
MCHC: 33.4 g/dL (ref 30.0–36.0)
MCV: 100.7 fL — AB (ref 78.0–100.0)
PLATELETS: 72 10*3/uL — AB (ref 150–400)
RBC: 2.97 MIL/uL — ABNORMAL LOW (ref 4.22–5.81)
RDW: 14 % (ref 11.5–15.5)
WBC: 4.8 10*3/uL (ref 4.0–10.5)

## 2014-01-02 LAB — BASIC METABOLIC PANEL
Anion gap: 12 (ref 5–15)
BUN: 45 mg/dL — ABNORMAL HIGH (ref 6–23)
CO2: 31 mEq/L (ref 19–32)
CREATININE: 5.91 mg/dL — AB (ref 0.50–1.35)
Calcium: 9.1 mg/dL (ref 8.4–10.5)
Chloride: 97 mEq/L (ref 96–112)
GFR calc Af Amer: 10 mL/min — ABNORMAL LOW (ref >90)
GFR, EST NON AFRICAN AMERICAN: 9 mL/min — AB (ref >90)
Glucose, Bld: 102 mg/dL — ABNORMAL HIGH (ref 70–99)
Potassium: 4.6 mEq/L (ref 3.7–5.3)
SODIUM: 140 meq/L (ref 137–147)

## 2014-01-02 LAB — GLUCOSE, CAPILLARY
GLUCOSE-CAPILLARY: 104 mg/dL — AB (ref 70–99)
GLUCOSE-CAPILLARY: 138 mg/dL — AB (ref 70–99)
Glucose-Capillary: 109 mg/dL — ABNORMAL HIGH (ref 70–99)
Glucose-Capillary: 112 mg/dL — ABNORMAL HIGH (ref 70–99)

## 2014-01-02 MED ORDER — MORPHINE SULFATE 2 MG/ML IJ SOLN
2.0000 mg | INTRAMUSCULAR | Status: DC | PRN
  Administered 2014-01-03 – 2014-01-04 (×2): 2 mg via INTRAVENOUS
  Filled 2014-01-02 (×3): qty 1

## 2014-01-02 MED ORDER — OXYCODONE-ACETAMINOPHEN 5-325 MG PO TABS
1.0000 | ORAL_TABLET | ORAL | Status: DC | PRN
  Administered 2014-01-04: 2 via ORAL
  Filled 2014-01-02: qty 2

## 2014-01-02 NOTE — Progress Notes (Signed)
Physical Therapy Treatment Patient Details Name: [name] MRN: [id number] DOB: 01/09/1951 Today's Date: 01/02/2014    History of Present Illness s/p fall resulting in L hip fx and now s/p L hip pinning    PT Comments    Pt demonstrating improved tolerance during today's session but pain continues to limit patient. Unable to attempt gait today due to limited standing tolerance. D/C plan now SNF due to denial from CIR. Continued recommended 5x wk frequency due to pt being active PTA but continue to monitor for progress.    Follow Up Recommendations  SNF (Pt is now SNF; CIR not approved)     Equipment Recommendations       Recommendations for Other Services       Precautions / Restrictions Precautions Precautions: Other (comment) (Pt is blind) Precaution Comments: HD MWF Restrictions Weight Bearing Restrictions: Yes LLE Weight Bearing: Weight bearing as tolerated    Mobility  Bed Mobility Overal bed mobility: Needs Assistance;+2 for physical assistance Bed Mobility: Sidelying to Sit;Supine to Sit   Sidelying to sit: +2 for physical assistance;Max assist Supine to sit: Max assist;+2 for physical assistance     General bed mobility comments: Pt demonstrating improved ability to manage LLE with bed mobility but still requires A and max A +2 for trunk. Posterior lean initially while sitting EOB. Cues for technique and hand placement  Transfers Overall transfer level: Needs assistance Equipment used: Rolling walker (2 wheeled) Transfers: Sit to/from [facility] Sit to Stand: Max assist;+2 safety/equipment Stand pivot transfers: Mod assist;+2 safety/equipment (Pt able to do pivot transfer with 2nd person for safety)       General transfer comment: cues for hand placement and upright posture.  Ambulation/Gait             General Gait Details: unable to tolerate. Limited standing tolerance   Stairs            Wheelchair Mobility     Modified Rankin (Stroke Patients Only)       Balance     Sitting balance-Leahy Scale: Poor Sitting balance - Comments: Pt with R lateral lean when sitting.   Standing balance support: Bilateral upper extremity supported Standing balance-Leahy Scale: Poor                      Cognition Arousal/Alertness: Awake/alert Behavior During Therapy: WFL for tasks assessed/performed Overall Cognitive Status: Within Functional Limits for tasks assessed                      Exercises General Exercises - Lower Extremity Quad Sets: Strengthening;Left;15 reps (2 sets) Long Arc Quad: AAROM;Strengthening;Left;15 reps (2 sets) Hip ABduction/ADduction: AAROM;Strengthening;Left;15 reps (2 sets)    General Comments General comments (skin integrity, edema, etc.): O2 sats improved with mobility today. Only 1 episode of dropping below 90% with activity.       Pertinent Vitals/Pain Pain Assessment: 0-10 Pain Score: 4  Pain Location: L hip Pain Descriptors / Indicators: Moaning Pain Intervention(s): Monitored during session;Repositioned (Pt declined need for pain medication)    Home Living                      Prior Function            PT Goals (current goals can now be found in the care plan section) Progress towards PT goals: Progressing toward goals    Frequency  Min 5X/week (still recommend 5xwk freq due to  being very active PTA)    PT Plan Discharge plan needs to be updated    Co-evaluation             End of Session Equipment Utilized During Treatment: Gait belt;Oxygen (3L) Activity Tolerance: Patient limited by pain Patient left: in chair;with call bell/phone within reach     Time: [id number] PT Time Calculation (min): 32 min  Charges:  $Therapeutic Exercise: 8-22 mins $Therapeutic Activity: 8-22 mins                    G Codes:      [name] 01/02/2014, 2:23 PM

## 2014-01-02 NOTE — Progress Notes (Signed)
Patient ID: [name], male   DOB: 1951/02/02, 63 y.o.   MRN: [id number]     Subjective:  Patient reports pain as mild to moderate.  Patient denies any CP or SOB.  Objective:   VITALS:   Filed Vitals:   01/01/14 2352 01/02/14 0400 01/02/14 0527 01/02/14 0753  BP:   113/71 108/59  Pulse:   74 74  Temp:   98.8 F (37.1 C) 98.7 F (37.1 C)  TempSrc:    Oral  Resp: 18 18 18 18   Height:      Weight:      SpO2: 98% 98% 97% 98%    ABD soft Sensation intact distally Dorsiflexion/Plantar flexion intact Incision: dressing C/D/I and no drainage   Lab Results  Component Value Date   WBC 4.8 01/02/2014   HGB 10.0* 01/02/2014   HCT 29.9* 01/02/2014   MCV 100.7* 01/02/2014   PLT 72* 01/02/2014     Assessment/Plan: 2 Days Post-Op   Principal Problem:   Intertrochanteric fracture of left hip Active Problems:   DIAB W/UNS COMP TYPE II/UNS NOT STATED UNCNTRL   HYPERTENSION   CAD, s/p CABG 2013   PVD   ESRD (end stage renal disease) on dialysis   Hypotension of hemodialysis   Dialysis patient   Hip fracture   Advance diet Up with therapy WBAT Dry dressing PRN    Rande Brunt, Panhia Karl 01/02/2014, 2:21 PM   Edmonia Lynch MD [phone]

## 2014-01-02 NOTE — Progress Notes (Signed)
Subjective: Patient had a rough day yesterday with episode of hiccoughs preceding chest pain which he felt was due to heartburn.  Improved with Mylanta.  Only able to tolerate 3 hours of HD due to coccyx pain when sitting.  Feels much better this morning.  No recurrent heartburn/chest pain.  No shortness of breath.  Hip/Coccyx pain controlled.  Participated with PT/OT yesterday.  Objective: Vital signs in last 24 hours: Temp:  [97.8 F (36.6 C)-98.8 F (37.1 C)] 98.8 F (37.1 C) (10/01 0527) Pulse Rate:  [68-76] 74 (10/01 0527) Resp:  [11-30] 18 (10/01 0527) BP: (81-136)/(50-74) 113/71 mmHg (10/01 0527) SpO2:  [97 %-100 %] 97 % (10/01 0527) Weight:  [98 kg (216 lb 0.8 oz)] 98 kg (216 lb 0.8 oz) (09/30 1543) Weight change:  Last BM Date: 12/28/13  CBG (last 3)   Recent Labs  01/01/14 1240 01/01/14 2043 01/02/14 0627  GLUCAP 139* 160* 104*    Intake/Output from previous day: 09/30 0701 - 10/01 0700 In: 360 [P.O.:360] Out: -416  Intake/Output this shift: Total I/O In: 245 [P.O.:120; I.V.:20; IV Piggyback:105] Out: -   General appearance: alert and no distress Eyes: no scleral icterus Throat: oropharynx moist without erythema Resp: clear to auscultation bilaterally Cardio: regular rate and rhythm GI: soft, non-tender; bowel sounds normal; no masses,  no organomegaly Extremities: no clubbing, cyanosis or edema; SCDs in place   Lab Results:  Recent Labs  01/01/14 0548 01/02/14 0612  NA 142 140  K 5.3 4.6  CL 95* 97  CO2 29 31  GLUCOSE 144* 102*  BUN 72* 45*  CREATININE 7.53* 5.91*  CALCIUM 9.4 9.1     Recent Labs  12/30/13 1336  01/01/14 0548 01/02/14 0612  WBC 4.3  < > 5.7 4.8  NEUTROABS 3.5  --   --   --   HGB 11.5*  < > 11.3* 10.0*  HCT 34.3*  < > 33.9* 29.9*  MCV 98.6  < > 99.7 100.7*  PLT 108*  < > 78* 72*  < > = values in this interval not displayed. Lab Results  Component Value Date   INR 1.26 09/29/2011   INR 1.11 09/28/2011   INR 1.0  09/21/2011    Studies/Results: Dg Hip Operative Left  12/31/2013   CLINICAL DATA:  Hip fixation.  EXAM: OPERATIVE LEFT HIP  COMPARISON:  12/30/2013  FINDINGS: Two intraoperative views which demonstrate screw fixation of the femoral neck. Alignment is unchanged, and no acute hardware complication is identified. Radiation seeds in the prostate.  IMPRESSION: Intraoperative imaging of screw fixation, proximal left femur.   Electronically Signed   By: Abigail Miyamoto M.D.   On: 12/31/2013 15:00   Dg Pelvis Portable  12/31/2013   CLINICAL DATA:  Post left hip screw placement  EXAM: PORTABLE PELVIS 1-2 VIEWS  COMPARISON:  Left hip radiographs - 12/30/2013 ; intraoperative radiographs of the left hip - earlier same day  FINDINGS: The patient has undergone lag screw fixation of the left femoral neck with 3 transfixing lag screws. Alignment appears near anatomic given obliquity. There is a minimal amount of expected subcutaneous emphysema about the operative site. No radiopaque foreign body.  Mild degenerative change of the bilateral hips is suspected. Brachytherapy seeds overlie the expected location of the prostate gland. Vascular calcifications.  IMPRESSION: Post lag screw fixation of the left femoral neck without evidence of complication.   Electronically Signed   By: Sandi Mariscal M.D.   On: 12/31/2013 15:30  Medications: Scheduled: . acetaminophen  1,000 mg Oral Once  . aspirin EC  325 mg Oral Q breakfast  . calcium acetate  2,001 mg Oral TID WC  . darbepoetin (ARANESP) injection - DIALYSIS  25 mcg Intravenous Q Wed-HD  . docusate sodium  100 mg Oral BID  . ferric gluconate (FERRLECIT/NULECIT) IV  62.5 mg Intravenous Q Wed-HD  . insulin aspart  0-9 Units Subcutaneous TID WC  . multivitamin  1 tablet Oral QHS  . sodium chloride  3 mL Intravenous Q12H   Continuous:   Assessment/Plan: Principal Problem:  1. Intertrochanteric fracture of left hip s/p cannulated hip pinning (POD #2)- Pain controlled  today.  Continue PT/OT. On SCDs, ASA for DVT prophylaxis per Ortho. Continue IS for mild post-op hypoxia- wean O2.  Active Problems:  2. Diabetes with ophthalmic, renal, vascular complications- Diet-controlled. Continue SSI.  3. HYPERTENSION-recent blood pressures have been well controlled without treatment. Has experienced some hypotension with dialysis.  4. CAD, s/p CABG 2013-resume statin on discharge. ASA restarted  5. PVD- pulses intact currently with no recent claudication symptoms.  6. ESRD (end stage renal disease) on dialysis-anticipate additional HD today and resume usual schedule tomorrow. Seems to have helped with nausea which was likely related to medications/anesthesia.  7. Hypotension of hemodialysis-continue Midodrine with hemodialysis  8. GERD- continue Mylanta- add PPI if recurrent.  Low suspicion of cardiac etiology or PE as cause of chest pain. 8. Disposition- patient is very motivated- Anticipate he will be ready for discharge Friday after dialysis to CIR if qualifies.   LOS: 3 days   [name] 01/02/2014, 7:50 AM

## 2014-01-02 NOTE — Progress Notes (Signed)
I will meet with pt today to discuss rehab venue options and then I will have to seek insurance approval for a possible inpt rehab admission pending their approval. [phone]

## 2014-01-02 NOTE — Progress Notes (Signed)
Subjective: " Feel better since hip surgery"  HD yesterday with some coccyx discomfort  But was post op  1 day and was in [facility] for HD not bed. Feels okay now  Objective Vital signs in last 24 hours: Filed Vitals:   01/01/14 2352 01/02/14 0400 01/02/14 0527 01/02/14 0753  BP:   113/71 108/59  Pulse:   74 74  Temp:   98.8 F (37.1 C) 98.7 F (37.1 C)  TempSrc:    Oral  Resp: 18 18 18 18   Height:      Weight:      SpO2: 98% 98% 97% 98%   Weight change:   Physical Exam: General: alert NAD, appropriate  Heart: RRR no mur, rub, gallop  Lungs: CTA bilat  Abdomen: BS pos. , soft, nontender, nondistendeind  Extremities: no pedal edema   Dialysis Access: Pos. bruit L arm AVF / mild eechymosis   OP Dialysis Orders: Center: [facility] on 4 .  EDW 98kg HD [place] 2.0 ca, 2.0 k Time 4hr Heparin 2600. Access LUA AVF BFR 450 DFR 800  no po vit d Aranesp 80mcg q wed hd Units IV/HD Venofer 50 mg q hd wed  Other Hb op 10.7 12-29-13   Problem/Plan: 1. L Hip Fracture s/p fall- status post cannulated hip pinning 12/31/13/ REHAB checking with [facility]  For transfer  2. ESRD - HD MWF/ HD in Bed tomorrow and next week use [facility]  3. Hypertension/volume - Stable Bp and volume / using Midodrine for bp support on hd/  uf in am 1to 2 liters  4. Anemia - hgb 11.3> 10.0 / continue weekly ESA and iron at dialysis. 5. Metabolic bone disease -  not on vitamin D receptor analogs, continue phosphorus binder 3 times a day a.c. 6. Nutrition - renal / carb mod when eating / renal vit  7. IDDM Type 2- per admit 8. CAD sp Cabg- on statin / asa   Ernest Haber, PA-C [facility] [phone] 01/02/2014,9:46 AM  LOS: 3 days   Labs: Basic Metabolic Panel:  Recent Labs Lab 12/31/13 0545 01/01/14 0548 01/02/14 0612  NA 142 142 140  K 4.3 5.3 4.6  CL 96 95* 97  CO2 31 29 31   GLUCOSE 119* 144* 102*  BUN 56* 72* 45*  CREATININE 5.93* 7.53* 5.91*  CALCIUM 9.1 9.4 9.1   CBC:  Recent  Labs Lab 12/30/13 1336 12/31/13 0545 01/01/14 0548 01/02/14 0612  WBC 4.3 4.8 5.7 4.8  NEUTROABS 3.5  --   --   --   HGB 11.5* 10.7* 11.3* 10.0*  HCT 34.3* 32.4* 33.9* 29.9*  MCV 98.6 99.4 99.7 100.7*  PLT 108* 97* 78* 72*   CBG:  Recent Labs Lab 12/31/13 2130 01/01/14 0641 01/01/14 1240 01/01/14 2043 01/02/14 0627  GLUCAP 149* 114* 139* 160* 104*    Studies/Results: Dg Hip Operative Left  12/31/2013   CLINICAL DATA:  Hip fixation.  EXAM: OPERATIVE LEFT HIP  COMPARISON:  12/30/2013  FINDINGS: Two intraoperative views which demonstrate screw fixation of the femoral neck. Alignment is unchanged, and no acute hardware complication is identified. Radiation seeds in the prostate.  IMPRESSION: Intraoperative imaging of screw fixation, proximal left femur.   Electronically Signed   By: Abigail Miyamoto M.D.   On: 12/31/2013 15:00   Dg Pelvis Portable  12/31/2013   CLINICAL DATA:  Post left hip screw placement  EXAM: PORTABLE PELVIS 1-2 VIEWS  COMPARISON:  Left hip radiographs - 12/30/2013 ; intraoperative radiographs of  the left hip - earlier same day  FINDINGS: The patient has undergone lag screw fixation of the left femoral neck with 3 transfixing lag screws. Alignment appears near anatomic given obliquity. There is a minimal amount of expected subcutaneous emphysema about the operative site. No radiopaque foreign body.  Mild degenerative change of the bilateral hips is suspected. Brachytherapy seeds overlie the expected location of the prostate gland. Vascular calcifications.  IMPRESSION: Post lag screw fixation of the left femoral neck without evidence of complication.   Electronically Signed   By: Sandi Mariscal M.D.   On: 12/31/2013 15:30   Medications:   . acetaminophen  1,000 mg Oral Once  . aspirin EC  325 mg Oral Q breakfast  . calcium acetate  2,001 mg Oral TID WC  . darbepoetin (ARANESP) injection - DIALYSIS  25 mcg Intravenous Q Wed-HD  . docusate sodium  100 mg Oral BID  .  ferric gluconate (FERRLECIT/NULECIT) IV  62.5 mg Intravenous Q Wed-HD  . insulin aspart  0-9 Units Subcutaneous TID WC  . multivitamin  1 tablet Oral QHS  . sodium chloride  3 mL Intravenous Q12H

## 2014-01-02 NOTE — Progress Notes (Signed)
I have personally seen and examined this patient and agree with the assessment/plan as outlined above by [facility] PA. Plans noted for CIR stay prior to DC- will continue ESRD care. Chalet Kerwin K.,MD 01/02/2014 10:05 AM

## 2014-01-02 NOTE — Progress Notes (Addendum)
[employer] will not approve an inpt rehab admission for this diagnosis. I have made pt aware and contacted his wife by phone. We discussed the need then for  SNF rehab dispo. I will alert RN CM and SW. [phone]  Wife called back to ask for official denial from [facility] not just through the onsite reviewer. I will submit clinicals and await decision from the offsite MD for [facility] [id number]

## 2014-01-03 DIAGNOSIS — M533 Sacrococcygeal disorders, not elsewhere classified: Secondary | ICD-10-CM | POA: Diagnosis present

## 2014-01-03 DIAGNOSIS — D696 Thrombocytopenia, unspecified: Secondary | ICD-10-CM | POA: Diagnosis present

## 2014-01-03 LAB — CBC
HCT: 30.2 % — ABNORMAL LOW (ref 39.0–52.0)
HEMOGLOBIN: 9.9 g/dL — AB (ref 13.0–17.0)
MCH: 32.5 pg (ref 26.0–34.0)
MCHC: 32.8 g/dL (ref 30.0–36.0)
MCV: 99 fL (ref 78.0–100.0)
Platelets: 81 10*3/uL — ABNORMAL LOW (ref 150–400)
RBC: 3.05 MIL/uL — ABNORMAL LOW (ref 4.22–5.81)
RDW: 13.7 % (ref 11.5–15.5)
WBC: 5.1 10*3/uL (ref 4.0–10.5)

## 2014-01-03 LAB — BASIC METABOLIC PANEL
ANION GAP: 17 — AB (ref 5–15)
BUN: 63 mg/dL — ABNORMAL HIGH (ref 6–23)
CO2: 27 meq/L (ref 19–32)
Calcium: 9.5 mg/dL (ref 8.4–10.5)
Chloride: 94 mEq/L — ABNORMAL LOW (ref 96–112)
Creatinine, Ser: 7.66 mg/dL — ABNORMAL HIGH (ref 0.50–1.35)
GFR calc Af Amer: 8 mL/min — ABNORMAL LOW (ref >90)
GFR calc non Af Amer: 7 mL/min — ABNORMAL LOW (ref >90)
Glucose, Bld: 100 mg/dL — ABNORMAL HIGH (ref 70–99)
Potassium: 4.6 mEq/L (ref 3.7–5.3)
SODIUM: 138 meq/L (ref 137–147)

## 2014-01-03 LAB — GLUCOSE, CAPILLARY
GLUCOSE-CAPILLARY: 132 mg/dL — AB (ref 70–99)
GLUCOSE-CAPILLARY: 78 mg/dL (ref 70–99)
GLUCOSE-CAPILLARY: 96 mg/dL (ref 70–99)
Glucose-Capillary: 110 mg/dL — ABNORMAL HIGH (ref 70–99)

## 2014-01-03 MED ORDER — POLYETHYLENE GLYCOL 3350 17 G PO PACK: 17.0000 g | PACK | Freq: Every day | ORAL | Status: DC | PRN

## 2014-01-03 MED ORDER — HYDROCODONE-ACETAMINOPHEN 5-325 MG PO TABS: 1.0000 | ORAL_TABLET | Freq: Four times a day (QID) | ORAL | Status: DC | PRN

## 2014-01-03 MED ORDER — ZOLPIDEM TARTRATE 5 MG PO TABS: 5.0000 mg | ORAL_TABLET | Freq: Every evening | ORAL | Status: DC | PRN

## 2014-01-03 MED ORDER — DSS 100 MG PO CAPS: 100.0000 mg | ORAL_CAPSULE | Freq: Two times a day (BID) | ORAL | Status: DC

## 2014-01-03 MED ORDER — MIDODRINE HCL 5 MG PO TABS: ORAL_TABLET | ORAL | Status: DC

## 2014-01-03 MED ORDER — OXYCODONE-ACETAMINOPHEN 5-325 MG PO TABS: 1.0000 | ORAL_TABLET | ORAL | Status: DC | PRN

## 2014-01-03 MED ORDER — DARBEPOETIN ALFA-POLYSORBATE 25 MCG/0.42ML IJ SOLN: 25.0000 ug | INTRAMUSCULAR | Status: DC

## 2014-01-03 MED ORDER — INSULIN ASPART 100 UNIT/ML ~~LOC~~ SOLN: 0.0000 [IU] | Freq: Three times a day (TID) | SUBCUTANEOUS | Status: DC

## 2014-01-03 MED ORDER — ASPIRIN 325 MG PO TBEC: 325.0000 mg | DELAYED_RELEASE_TABLET | Freq: Every day | ORAL | Status: DC

## 2014-01-03 MED ORDER — ACETAMINOPHEN 325 MG PO TABS: 650.0000 mg | ORAL_TABLET | Freq: Four times a day (QID) | ORAL | Status: DC | PRN

## 2014-01-03 MED ORDER — ALUM & MAG HYDROXIDE-SIMETH 200-200-20 MG/5ML PO SUSP: 30.0000 mL | ORAL | Status: DC | PRN

## 2014-01-03 NOTE — Progress Notes (Addendum)
Patient and wife have concerns of being turned in bed with extreme hip pain. Patient had no IV because he was scheduled to leave in AM. Gave patient Vicodin 2 tabs. Pain medication is not giving relief. Patient is continuously moaning and wife is concerned because she stated that he has never had this kind of pain before. Readjusted pillow on side to give relief. Attempted to reinsert IV, unsuccessful. Called Charge Nurse to reinsert IV. Per patient and wife request, notified on call physician. Will continue to monitor at this time.

## 2014-01-03 NOTE — Discharge Summary (Addendum)
DISCHARGE SUMMARY  [name]  MR#: [id number]  DOB:March 22, 1951  Date of Admission: 12/30/2013 Date of Discharge: 01/04/2014  Attending Physician:Deone Omahoney  Patient's UH:[id number], Nicholas Saxon, MD  Consults:  Renette Butters, MD- Orthopaedic Surgery  Clayborne Dana. Posey Pronto, MD- nephrology  Discharge Diagnoses: Principal Problem:   Intertrochanteric fracture of left hip Active Problems:   Diabetes mellitus type 2 with renal, vascular, ophthalmologic complications   HYPERTENSION   CAD, s/p CABG 2013   PVD   ESRD (end stage renal disease) on dialysis   Hypotension of hemodialysis   Coccyx pain   Thrombocytopenia  Past Medical History:  DM2 with retinopathy, neuropathy, nephropathy (previously followed by Dr. Debbora Presto)- previously insulin dependant, now diet-controlled.  CRI, Stage 3-4 (followed by Dr. Donnetta Hail requiring HD (started 7/12)  Hyperlipidemia  Coronary artery disease status post CABG (6/13)  Elevated PSA- biopsy atypia (2008)- followed by Dr. Valla Leaver CA, Gleason 6 (9/13)  Right Renal Cell CA s/p Radiofrequency ablation (1/09)-NED  Left Renal Cell CA (Dx: 10/11)-->Radiofrequency ablation (12/11)  Prostate CA s/p brachytherapy at [facility] (12/13)  Anemia on Procrit  colon polyps (11/10)  ED  Peripheral Vascular Disease  Past Surgical History  Procedure Laterality Date  . Venectomy  1980's    "knees down; both legs; 2 separate times  . Foot amputation  ~ 2002    right;  partial; "infection"  . Cholecystectomy  1992  . Colonoscopy  06/14/2011    Procedure: COLONOSCOPY;  Surgeon: [name], MD;  Location: [facility];  Service: Endoscopy;  Laterality: N/A;  . Hot hemostasis  06/14/2011    Procedure: HOT HEMOSTASIS (ARGON PLASMA COAGULATION/BICAP);  Surgeon: [name], MD;  Location: [facility];  Service: Endoscopy;  Laterality: N/A;  . Av fistula placement  02/2010    LFA  . Varicose vein surgery  mid 1980's    BLE  . Radiofrequency  ablation kidney  2010-2012    "twice; one on each side; for cancer"  . Enucleation  2003; 2006    bilateral; "diabetes; pain"  . Cardiac catheterization  ~ 2001  . Coronary artery bypass graft  09/29/2011    Procedure: CORONARY ARTERY BYPASS GRAFTING (CABG);  Surgeon: [name], MD;  Location: [place];  Service: Open Heart Surgery;  Laterality: N/A;  Coronary Artery Bypass Graft times two utilizing the left internal mammary artery and the right greater saphenous vein harvested endoscopically.  . Colonoscopy N/A 07/24/2012    Procedure: COLONOSCOPY;  Surgeon: [name], MD;  Location: [facility];  Service: Endoscopy;  Laterality: N/A;  . Hip pinning,cannulated Left 12/31/2013    Procedure: CANNULATED HIP PINNING;  Surgeon: Renette Butters, MD;  Location: [place];  Service: Orthopedics;  Laterality: Left;  Carm, FX Table, [employer]    Discharge Medications:   Medication List         acetaminophen 325 MG tablet  Commonly known as:  TYLENOL  Take 2 tablets (650 mg total) by mouth every 6 (six) hours as needed for mild pain (or Fever >/= 101).     alum & mag hydroxide-simeth 200-200-20 MG/5ML suspension  Commonly known as:  MAALOX/MYLANTA  Take 30 mLs by mouth every 4 (four) hours as needed for indigestion or heartburn.     aspirin 325 MG EC tablet  Take 1 tablet (325 mg total) by mouth daily with breakfast.     atorvastatin 10 MG tablet  Commonly known as:  LIPITOR  Take 10 mg by mouth daily.  b complex-vitamin c-folic acid 0.8 MG Tabs tablet  Take 0.8 mg by mouth at bedtime.     calcium acetate 667 MG capsule  Commonly known as:  PHOSLO  Take 2,001 mg by mouth 3 (three) times daily with meals.     darbepoetin 25 MCG/0.42ML Soln injection  Commonly known as:  ARANESP  Inject 0.42 mLs (25 mcg total) into the vein every Wednesday with hemodialysis.     docusate sodium 100 MG capsule  Commonly known as:  COLACE  Take 1 capsule (100 mg total) by mouth 2 (two) times  daily. Continue this while taking narcotics to help with bowel movements     DSS 100 MG Caps  Take 100 mg by mouth 2 (two) times daily.     HYDROcodone-acetaminophen 5-325 MG per tablet  Commonly known as:  NORCO  Take 1-2 tablets by mouth every 4 (four) hours as needed for moderate pain.     HYDROcodone-acetaminophen 5-325 MG per tablet  Commonly known as:  NORCO/VICODIN  Take 1-2 tablets by mouth every 6 (six) hours as needed for moderate pain.     insulin aspart 100 UNIT/ML injection  Commonly known as:  novoLOG  Inject 0-9 Units into the skin 3 (three) times daily with meals.     midodrine 5 MG tablet  Commonly known as:  PROAMATINE  Take 1 pill daily on dialysis days prior to dialysis as directed     ondansetron 4 MG tablet  Commonly known as:  ZOFRAN  Take 1 tablet (4 mg total) by mouth every 8 (eight) hours as needed for nausea.     oxyCODONE-acetaminophen 5-325 MG per tablet  Commonly known as:  PERCOCET/ROXICET  Take 1-2 tablets by mouth every 4 (four) hours as needed for severe pain.     polyethylene glycol packet  Commonly known as:  MIRALAX / GLYCOLAX  Take 17 g by mouth daily as needed for mild constipation.     zolpidem 5 MG tablet  Commonly known as:  AMBIEN  Take 1 tablet (5 mg total) by mouth at bedtime as needed and may repeat dose one time if needed for sleep.        Hospital Procedures: Dg Chest 2 View  12/30/2013   CLINICAL DATA:  Fall at dialysis. Left hip pain. History of hypertension.  EXAM: CHEST  2 VIEW  COMPARISON:  10/25/2011  FINDINGS: Patient has had median sternotomy. Heart size is normal. The lungs are clear. No edema. No acute, displaced fractures.  IMPRESSION: No evidence for acute  abnormality.   Electronically Signed   By: Shon Hale M.D.   On: 12/30/2013 15:53   Dg Hip Complete Left  12/30/2013   CLINICAL DATA:  Pain post trauma  EXAM: LEFT HIP - COMPLETE 2+ VIEW  COMPARISON:  None.  FINDINGS: Frontal pelvis as well as frontal and  lateral left hip images were obtained. There is a sclerotic focus in the subcapital region of the left proximal femur which is concerning for a potential impaction type injury in this area. There is no other evidence suggesting potential fracture. No dislocation. There is moderate osteoarthritic change in both hip joints. Bones are somewhat osteoporotic.  There are seed implants in the prostate region. There are foci of arterial vascular calcification.  IMPRESSION: There is a linear sclerotic area in the subcapital femoral neck region on the left which raises concern for and impaction type injury in this area. This finding warrants further evaluation. MR would be the study  of choice to further evaluate this region. If the patient has a contraindication MRI, CT would be an alternative study for evaluating this area.  Bones are osteoporotic. There is osteoarthritic change in both hip joints. No dislocation. Seed implants in prostate region.   Electronically Signed   By: Lowella Grip M.D.   On: 12/30/2013 14:51   Dg Hip Operative Left  12/31/2013   CLINICAL DATA:  Hip fixation.  EXAM: OPERATIVE LEFT HIP  COMPARISON:  12/30/2013  FINDINGS: Two intraoperative views which demonstrate screw fixation of the femoral neck. Alignment is unchanged, and no acute hardware complication is identified. Radiation seeds in the prostate.  IMPRESSION: Intraoperative imaging of screw fixation, proximal left femur.   Electronically Signed   By: Abigail Miyamoto M.D.   On: 12/31/2013 15:00   Dg Pelvis Portable  12/31/2013   CLINICAL DATA:  Post left hip screw placement  EXAM: PORTABLE PELVIS 1-2 VIEWS  COMPARISON:  Left hip radiographs - 12/30/2013 ; intraoperative radiographs of the left hip - earlier same day  FINDINGS: The patient has undergone lag screw fixation of the left femoral neck with 3 transfixing lag screws. Alignment appears near anatomic given obliquity. There is a minimal amount of expected subcutaneous emphysema  about the operative site. No radiopaque foreign body.  Mild degenerative change of the bilateral hips is suspected. Brachytherapy seeds overlie the expected location of the prostate gland. Vascular calcifications.  IMPRESSION: Post lag screw fixation of the left femoral neck without evidence of complication.   Electronically Signed   By: Sandi Mariscal M.D.   On: 12/31/2013 15:30   Cannulated left hip pinning (12/31/13)  History of Present Illness: MR. Sharpsteen is a 63 year old white male with a history of diabetes mellitus type 2 (diet controlled), blindness status post bilateral enucleation, coronary artery disease status post CABG, peripheral vascular disease status post right foot right amputation, end-stage renal disease on hemodialysis, bilateral renal cell cancer, and prostate cancer who presented to the emergency department after a fall at hemodialysis resulting in left hip pain. Patient has been doing remarkably well despite his medical complexity. He continues with his hemodialysis on Monday Wednesday and Friday at [facility]. This is been complicated by intermittent hypotension after dialysis. Recently, this has been better with you submitted trend. Today, after dialysis he was standing for his posttreatment blood pressure reading when he lost his balance and fell. He denies any lightheadedness or dizziness at that time and attributes it to trying to reach back to grab a chair when his walker slipped. He fell onto his left side resulting in severe left hip pain. He was brought to the emergency department where x-rays revealed a subcapital femoral neck sclerotic area concerning for impaction type injury/fracture. Orthopedics has been consulted and is anticipating surgery tomorrow. Request medical admission due to his medical complexity.  At baseline, the patient is very active. He continues to exercise regularly participating in multiple walls including the recent Relay [employer] walk. He was referred to  the upcoming [facility] Walk. With vigorous exercise he denies any chest pain or shortness of breath. He's had multiple surgeries in the past with no prior perioperative complications.   Hospital Course: [name] was admitted to a medical bed. He was evaluated by orthopedic surgery who performed a cannulated left hip pinning on 9/29. He tolerated the procedure well with significant improvement and left hip pain. He continued to have some coccyx pain which seemed to be related to prolonged sitting in the  bed and in the dialysis chair. He did develop some postoperative nausea, heartburn and hiccups which resolved with Mylanta. Nausea resolved after hemodialysis. He continued on his usual Monday, Wednesday, Friday dialysis schedule. His dialysis on Wednesday was discontinued 1 hour early due to coccyx pain. He is currently receiving his Friday hemodialysis and seems to be tolerating it well. He is weightbearing as tolerated.  He was evaluated by physical therapy and occupational therapy who recommended acute rehabilitation. [facility] inpatient rehabilitation felt that he was appropriate for admission but insurance would not cover. Arrangements are being made for discharge to a skilled nursing facility for rehabilitation. Due to the patient's blindness he is a high fall risk. However, he is motivated to return to his usual six-day a week exercise routine. Prognosis for return home his very good after a brief course of rehabilitation.  Upon evaluation on day of discharge, patient states his pain is controlled on current PO medications , of which Rx is on chart for SNF , and he is accepting of going to [facility]. Orders signed. Bed available. Stable for D/C  Day of Discharge Exam BP 140/82  Pulse 73  Temp(Src) 98.2 F (36.8 C) (Oral)  Resp 18  Ht 6\' 4"  (1.93 m)  Wt 216 lb 0.8 oz (98 kg)  BMI 26.31 kg/m2  SpO2 98%  Physical Exam: General appearance: alert, no distress and Receiving hemodialysis Eyes:  bilateral enucleation Throat: oropharynx moist without erythema Resp: clear to auscultation bilaterally Cardio: regular rate and rhythm GI: soft, non-tender; bowel sounds normal; no masses,  no organomegaly Extremities: no clubbing, cyanosis or edema  Discharge Labs:  Recent Labs  01/03/14 0407 01/04/14 0450  NA 138 141  K 4.6 4.2  CL 94* 100  CO2 27 28  GLUCOSE 100* 93  BUN 63* 41*  CREATININE 7.66* 5.43*  CALCIUM 9.5 8.8    Recent Labs  01/03/14 0407 01/04/14 0450  WBC 5.1 3.7*  HGB 9.9* 9.4*  HCT 30.2* 28.1*  MCV 99.0 98.9  PLT 81* 89*    Discharge instructions: Discharge Instructions   Diet Carb Modified    Complete by:  As directed      Discharge instructions    Complete by:  As directed   Continue physical therapy/occupational therapy with weightbearing as tolerated status. Strict fall precautions due to patient blindness.     Discharge wound care:    Complete by:  As directed   Dry dressing to left hip as needed.     Increase activity slowly    Complete by:  As directed      Weight bearing as tolerated    Complete by:  As directed            Disposition: To skilled nursing facility for acute rehabilitation  Follow-up Appts: Follow-up with Dr. Brigitte Pulse at [facility] within one week of discharge from rehabilitation facility. Please call to notify of discharge within 24 hours of discharge.  Condition on Discharge: stable  Tests Needing Follow-up: None  Time with discharge activities: 40 minutes  Signed: Jazira Maloney 01/04/2014, 7:25 AM

## 2014-01-03 NOTE — Care Management Utilization Note (Signed)
Note that Mr Bumpers has been discharged when SNF bed available. Per Dr Reynaldo Minium, Care Management Department Medical Director, he has a bed available for him however his wife is refusing that process. Given that DC is written, a [id number] - CMS' notice of noncoverage of continued stay as of January 04, 2014, is being delivered to wife.

## 2014-01-03 NOTE — Progress Notes (Signed)
Physical Therapy Treatment Patient Details Name: [name] MRN: [id number] DOB: 09-Jan-1951 Today's Date: 01/03/2014    History of Present Illness s/p fall resulting in L hip fx and now s/p L hip pinning    PT Comments    Patient continues to require +2 max assist for all mobility. Patient crying out in pain throughout session with any movement. Continue to recommend SNF for ongoing Physical Therapy.     Follow Up Recommendations  SNF     Equipment Recommendations  Other (comment) (TBD)    Recommendations for Other Services       Precautions / Restrictions Precautions Precaution Comments: HD MWF Restrictions Weight Bearing Restrictions: Yes LLE Weight Bearing: Weight bearing as tolerated    Mobility  Bed Mobility Overal bed mobility: Needs Assistance;+2 for physical assistance Bed Mobility: Supine to Sit;Rolling Rolling: Max assist   Supine to sit: Max assist;+2 for physical assistance     General bed mobility comments: A with LEs back into bed and for trunk support and head control.   Transfers Overall transfer level: Needs assistance Equipment used: Rolling walker (2 wheeled)     Stand pivot transfers: +2 safety/equipment;Max assist       General transfer comment: cues for hand placement and upright posture. A for all aspects of transfer. Patient with narrow BOS throughout. Use of chuck pad to assist with transfer  Ambulation/Gait             General Gait Details: unable to tolerate. Limited standing tolerance   Stairs            Wheelchair Mobility    Modified Rankin (Stroke Patients Only)       Balance                                    Cognition Arousal/Alertness: Awake/alert Behavior During Therapy: WFL for tasks assessed/performed Overall Cognitive Status: Within Functional Limits for tasks assessed                      Exercises      General Comments        Pertinent Vitals/Pain Faces Pain  Scale: Hurts whole lot Pain Location: L hip Pain Descriptors / Indicators: Moaning Pain Intervention(s): Monitored during session;Repositioned    Home Living                      Prior Function            PT Goals (current goals can now be found in the care plan section) Progress towards PT goals: Progressing toward goals    Frequency  Min 5X/week    PT Plan Current plan remains appropriate    Co-evaluation             End of Session Equipment Utilized During Treatment: Gait belt Activity Tolerance: Patient limited by fatigue;Patient limited by pain Patient left: in bed;with call bell/phone within reach     Time: 1359-1432 PT Time Calculation (min): 33 min  Charges:  $Therapeutic Activity: 23-37 mins                    G Codes:      Jacqualyn Posey 01/03/2014, 2:52 PM  01/03/2014 Jacqualyn Posey PTA [phone] pager [phone] office

## 2014-01-03 NOTE — Clinical Social Work Psychosocial (Signed)
Clinical Social Work Department BRIEF PSYCHOSOCIAL ASSESSMENT 01/03/2014  Patient:  [name], [name]     Account Number:  [id number]     Admit date:  12/30/2013  Clinical Social Worker:  Wylene Men  Date/Time:  01/03/2014 01:40 PM  Referred by:  Physician  Date Referred:  01/03/2014 Referred for  SNF Placement  Other - See comment   Other Referral:   CIR reports [facility] Medicare will not approve pt's diagnosis. CSW consulted for SNF backup.   Interview type:  Other - See comment Other interview type:   CSW met with pt and wife at bedside.    PSYCHOSOCIAL DATA Living Status:  WIFE Admitted from facility:   Level of care:   Primary support name:  [name] Primary support relationship to patient:  SPOUSE Degree of support available:   adequate    CURRENT CONCERNS Current Concerns  Post-Acute Placement   Other Concerns:   none    SOCIAL WORK ASSESSMENT / PLAN CSW assessed pt at bedside. Pt was alert and oriented x4. Of record, pt sight is impaired.  Wife remained at bedside during assessment per pt request.  Wife completed assessment.  Pt was non-paticipatory.    Pt wife is a Animal nutritionist.  PT has recommended CIR.  The pt dx is not a covered dx for [employer]/admission to CIR.  Pt and wife aware.  Pt wife contacted [facility] who states they have not received clinical information and have not denied CIR for the pt.  Of report, CIR has explained to pt and wife that [facility] has an on-site reviewer who reviews our pt's clinical information for possible admission.  Wife was not satisified with this answer and requested CIR submit to [facility] directly instead of onsite reviewer.  Per CIR admission's coordinator's documentation, this was done.  Pt and pt wife adamantly refusing SNF at this time stating that pt will go to CIR.  Pt wife, [name], states if [facility] denies, she will appeal both the denial and the dc.  At this point, the per MD, the pt is stable and ready for dc.  RNCM is aware.  CSW contacted Medical  Director to make aware.  CSW also contacted CSW Surveyor, quantity to make aware.  CSW will continue to follow pt and disposition needs.   Assessment/plan status:  Psychosocial Support/Ongoing Assessment of Needs Other assessment/ plan:   FL2  PASARR   Information/referral to community resources:   SNF  CIR    PATIENT'S/FAMILY'S RESPONSE TO PLAN OF CARE: Pt and family adamantly refuse SNF and are only agreeable to CIR disposition.       Nonnie Done, [place] [phone]  Psychiatric & Orthopedics (5N 1-16) Clinical Social Worker

## 2014-01-03 NOTE — Care Management Note (Signed)
CARE MANAGEMENT NOTE 01/03/2014  Patient:  [name], [name]   Account Number:  [id number]  Date Initiated:  01/01/2014  Documentation initiated by:  Ricki Miller  Subjective/Objective Assessment:   63 yr old male admitted with left hip fracture, Left hip pinning performed.     Action/Plan:   Patient to be evaluated for CIR, Case manager will follow.   Anticipated DC Date:  01/03/2014   Anticipated DC Plan:  SKILLED NURSING FACILITY  In-house referral  Clinical Social Worker      DC Planning Services  CM consult      Choice offered to / List presented to:     DME arranged  NA        [place] arranged  NA      Status of service:  In process, will continue to follow Medicare Important Message given?  YES (If response is "NO", the following Medicare IM given date fields will be blank) Date Medicare IM given:  01/03/2014 Medicare IM given by:  Ricki Miller Date Additional Medicare IM given:   Additional Medicare IM given by:    Discharge Disposition:    Per UR Regulation:  Reviewed for med. necessity/level of care/duration of stay  If discussed at [place] of Stay Meetings, dates discussed:    Comments:  01/03/14 Ricki Miller, RN BSN Case Manager Case manager spoke with Mrs. Canipe concerning discharge plans for her husband. Patient has been denied approval for CIR. CM asked if she planned to take patient home and who would provided care, per wife this is definitely not an option. Discussion had concerning SNF placement for shortterm rehab. Mrs. Bratcher would like to Education officer, museum to see what facilities would transport patient to Dialysis on [address]. She would like to check [facility] and [facility]. CM will notify Education officer, museum.

## 2014-01-03 NOTE — Progress Notes (Signed)
I participated in the care of this patient and agree with the above history, physical and evaluation. I performed a review of the history and a physical exam as detailed   Bufford Helms Daniel Meylin Stenzel MD  

## 2014-01-03 NOTE — Progress Notes (Signed)
I have received a denial from the Medical Director at [place] for the requested admission to inpt rehab. I have informed the pt's wife of the denial. I have explained that [place] health Care secondary will not cover what [employer] Medicare will not approve. Wife has contacted the outpt dialysis center to request they assist with his medical care, as he fell at the dialysis center.  I have informed wife that Dr. Brigitte Pulse has written a discharge summary for discharge of pt today. I will update RN CM of the denial. [phone]

## 2014-01-03 NOTE — Progress Notes (Signed)
Patient ID: [name], male   DOB: 05/14/1950, 63 y.o.   MRN: [id number]   [facility] Progress Note   Assessment/ Plan:   1. L Hip Fracture s/p fall- status post cannulated hip pinning and currently doing well postoperatively with minimal pain. Anticipated discharge to skilled nursing facility possibly later today. 2. ESRD - HD mwf, the patient is currently tolerating hemodialysis without discomfort while sitting in his recliner (has a pillow positioned at the left hip).  3. Hypertension/volume - Stable / using Midodrine fro bp support on hd/ no uf in am  4. Anemia - hgb 11.3 / continue weekly ESA and iron at dialysis. 5. Metabolic bone disease - he is not on vitamin D receptor analogs, continue phosphorus binder 3 times a day a.c. 6. Nutrition - renal / carb mod when eating / renal vit  7. IDDM Type 2- per admit 8. CAD sp Cabg- on statin / asa  Subjective:   Reports to be comfortable and denies any acute events overnight.    Objective:   BP 111/58  Pulse 69  Temp(Src) 98.4 F (36.9 C) (Oral)  Resp 14  Ht 6\' 4"  (1.93 m)  Wt 98 kg (216 lb 0.8 oz)  BMI 26.31 kg/m2  SpO2 90%  Physical Exam: Gen: Appears to be comfortable resting on dialysis (in recliner) CVS: Pulse regular in rate and rhythm, S1 and S2 normal Resp: Here to auscultation bilaterally, no rales/rhonchi Abd: Soft, flat, nontender bowel sounds are normal Ext: No lower extremity edema appreciated  Labs: BMET  Recent Labs Lab 12/30/13 1336 12/31/13 0545 01/01/14 0548 01/02/14 0612 01/03/14 0407  NA 143 142 142 140 138  K 4.3 4.3 5.3 4.6 4.6  CL 98 96 95* 97 94*  CO2 34* 31 29 31 27   GLUCOSE 122* 119* 144* 102* 100*  BUN 38* 56* 72* 45* 63*  CREATININE 4.08* 5.93* 7.53* 5.91* 7.66*  CALCIUM 9.2 9.1 9.4 9.1 9.5   CBC  Recent Labs Lab 12/30/13 1336 12/31/13 0545 01/01/14 0548 01/02/14 0612 01/03/14 0407  WBC 4.3 4.8 5.7 4.8 5.1  NEUTROABS 3.5  --   --   --   --   HGB 11.5*  10.7* 11.3* 10.0* 9.9*  HCT 34.3* 32.4* 33.9* 29.9* 30.2*  MCV 98.6 99.4 99.7 100.7* 99.0  PLT 108* 97* 78* 72* 81*   Medications:    . acetaminophen  1,000 mg Oral Once  . aspirin EC  325 mg Oral Q breakfast  . calcium acetate  2,001 mg Oral TID WC  . darbepoetin (ARANESP) injection - DIALYSIS  25 mcg Intravenous Q Wed-HD  . docusate sodium  100 mg Oral BID  . ferric gluconate (FERRLECIT/NULECIT) IV  62.5 mg Intravenous Q Wed-HD  . insulin aspart  0-9 Units Subcutaneous TID WC  . multivitamin  1 tablet Oral QHS  . sodium chloride  3 mL Intravenous Q12H   Elmarie Shiley, MD 01/03/2014, 9:36 AM

## 2014-01-03 NOTE — Progress Notes (Signed)
Spoke with pt's wife re: SNF placement for pt.  After much discussion, and no bed availability at [facility], pt's wife has chosen [facility].  CSW called [facility] who spoke with Elon Alas, the facility's admission coordinator.  Per Elon Alas, she requested an FL2 be sent for her review.  After some discussion, Elon Alas was agreeable to accepting pt into a private room on 01/04/14.  Instructions for weekend d/c received, and CSW will pass along to weekend CSW who will facilitate d/c.  Pt's wife tearful, a little unsteady on her feet, as she related details of pt's medical history and their 42 year marriage.  CSW provided emotional support.  FL2 printed and left on front of chart for MD's signature.

## 2014-01-03 NOTE — Procedures (Signed)
Patient seen on Hemodialysis. QB 350, UF goal 0.5L Treatment adjusted as needed.  Elmarie Shiley MD [facility]. Office # [phone] Pager # [phone] 9:35 AM

## 2014-01-04 LAB — BASIC METABOLIC PANEL
ANION GAP: 13 (ref 5–15)
BUN: 41 mg/dL — ABNORMAL HIGH (ref 6–23)
CALCIUM: 8.8 mg/dL (ref 8.4–10.5)
CO2: 28 mEq/L (ref 19–32)
CREATININE: 5.43 mg/dL — AB (ref 0.50–1.35)
Chloride: 100 mEq/L (ref 96–112)
GFR, EST AFRICAN AMERICAN: 12 mL/min — AB (ref >90)
GFR, EST NON AFRICAN AMERICAN: 10 mL/min — AB (ref >90)
Glucose, Bld: 93 mg/dL (ref 70–99)
Potassium: 4.2 mEq/L (ref 3.7–5.3)
SODIUM: 141 meq/L (ref 137–147)

## 2014-01-04 LAB — CBC
HEMATOCRIT: 28.1 % — AB (ref 39.0–52.0)
Hemoglobin: 9.4 g/dL — ABNORMAL LOW (ref 13.0–17.0)
MCH: 33.1 pg (ref 26.0–34.0)
MCHC: 33.5 g/dL (ref 30.0–36.0)
MCV: 98.9 fL (ref 78.0–100.0)
Platelets: 89 10*3/uL — ABNORMAL LOW (ref 150–400)
RBC: 2.84 MIL/uL — ABNORMAL LOW (ref 4.22–5.81)
RDW: 13.7 % (ref 11.5–15.5)
WBC: 3.7 10*3/uL — AB (ref 4.0–10.5)

## 2014-01-04 LAB — GLUCOSE, CAPILLARY
GLUCOSE-CAPILLARY: 83 mg/dL (ref 70–99)
Glucose-Capillary: 96 mg/dL (ref 70–99)

## 2014-01-04 NOTE — Clinical Social Work Placement (Signed)
Clinical Social Work Department CLINICAL SOCIAL WORK PLACEMENT NOTE 01/04/2014  Patient:  [name], [name]  Account Number:  [id number] Admit date:  12/30/2013  Clinical Social Worker:  Alanie Syler Givens, LCSW  Date/time:  01/04/2014 01:18 AM  Clinical Social Work is seeking post-discharge placement for this patient at the following level of care:   SKILLED NURSING   (*CSW will update this form in Epic as items are completed)     Patient/family provided with [place] Department of Clinical Social Work's list of facilities offering this level of care within the geographic area requested by the patient (or if unable, by the patient's family).  01/03/2014  Patient/family informed of their freedom to choose among providers that offer the needed level of care, that participate in Medicare, Medicaid or managed care program needed by the patient, have an available bed and are willing to accept the patient.    Patient/family informed of [facility]' ownership interest in [facility], as well as of the fact that they are under no obligation to receive care at this facility.  PASARR submitted to EDS on PASARR number received on patient has PASARR number  FL2 transmitted to all facilities in geographic area requested by pt/family on  01/03/2014 FL2 transmitted to all facilities within larger geographic area on   Patient informed that his/her managed care company has contracts with or will negotiate with  certain facilities, including the following:     Patient/family informed of bed offers received:  01/03/2014 Patient chooses bed at [facility] Physician recommends and patient chooses bed at    Patient to be transferred to [facility] on  01/04/2014 Patient to be transferred to facility by ambulance Patient and family notified of transfer on 01/04/2014 Name of family member notified:  Wife, Tywain Sulkowski at the bedside  The following physician request were  entered in Epic:   Additional Comments:

## 2014-01-06 ENCOUNTER — Encounter (HOSPITAL_COMMUNITY): Payer: Self-pay | Admitting: Orthopedic Surgery

## 2014-01-07 ENCOUNTER — Ambulatory Visit (HOSPITAL_COMMUNITY): Admission: RE | Admit: 2014-01-07 | Payer: 59 | Source: Ambulatory Visit | Admitting: Gastroenterology

## 2014-01-07 ENCOUNTER — Encounter (HOSPITAL_COMMUNITY): Admission: RE | Payer: Self-pay | Source: Ambulatory Visit

## 2014-01-07 SURGERY — COLONOSCOPY
Anesthesia: Monitor Anesthesia Care

## 2014-01-21 ENCOUNTER — Other Ambulatory Visit (HOSPITAL_COMMUNITY): Payer: Self-pay | Admitting: Interventional Radiology

## 2014-01-21 DIAGNOSIS — C642 Malignant neoplasm of left kidney, except renal pelvis: Secondary | ICD-10-CM

## 2014-01-22 ENCOUNTER — Other Ambulatory Visit (HOSPITAL_COMMUNITY): Payer: Self-pay | Admitting: Interventional Radiology

## 2014-01-22 DIAGNOSIS — C642 Malignant neoplasm of left kidney, except renal pelvis: Secondary | ICD-10-CM

## 2014-01-23 ENCOUNTER — Encounter (HOSPITAL_COMMUNITY): Payer: Self-pay | Admitting: Orthopedic Surgery

## 2014-03-05 ENCOUNTER — Ambulatory Visit
Admission: RE | Admit: 2014-03-05 | Discharge: 2014-03-05 | Disposition: A | Discharge status: Home / Self Care | Payer: 59 | Source: Ambulatory Visit | Attending: Interventional Radiology | Admitting: Interventional Radiology

## 2014-03-05 ENCOUNTER — Ambulatory Visit (HOSPITAL_COMMUNITY)
Admission: RE | Admit: 2014-03-05 | Discharge: 2014-03-05 | Disposition: A | Discharge status: Home / Self Care | Payer: Medicare Other | Source: Ambulatory Visit | Attending: Interventional Radiology | Admitting: Interventional Radiology

## 2014-03-05 DIAGNOSIS — Z8546 Personal history of malignant neoplasm of prostate: Secondary | ICD-10-CM | POA: Diagnosis not present

## 2014-03-05 DIAGNOSIS — R161 Splenomegaly, not elsewhere classified: Secondary | ICD-10-CM | POA: Diagnosis not present

## 2014-03-05 DIAGNOSIS — Z85528 Personal history of other malignant neoplasm of kidney: Secondary | ICD-10-CM | POA: Diagnosis not present

## 2014-03-05 DIAGNOSIS — C642 Malignant neoplasm of left kidney, except renal pelvis: Secondary | ICD-10-CM

## 2014-03-05 DIAGNOSIS — C61 Malignant neoplasm of prostate: Secondary | ICD-10-CM | POA: Insufficient documentation

## 2014-03-05 DIAGNOSIS — Z9049 Acquired absence of other specified parts of digestive tract: Secondary | ICD-10-CM | POA: Diagnosis not present

## 2014-03-05 DIAGNOSIS — Q6102 Congenital multiple renal cysts: Secondary | ICD-10-CM | POA: Insufficient documentation

## 2014-03-05 NOTE — Progress Notes (Signed)
Chief Complaint: Status post treatment of bilateral clear cell renal carcinomas with radiofrequency ablation in January, 2009 of the right kidney and cryoablation in December, 2011 of the left kidney.  History of Present Illness: [name] is a 63 y.o. male status post cryoablation of a left renal carcinoma on 03/12/2010 and radiofrequency ablation of a right renal carcinoma in January, 2009. The patient is still recovering and gaining strength after a left hip fracture in late September. He has been undergoing renal transplant evaluation at [facility] and anticipates getting onto the renal transplant list soon.  He denies any abdominal or flank pain. He has had no hematuria. He still produces a small amount of urine. Dialysis via a left arm fistula has been uncomplicated.  Past Medical History  Diagnosis Date  . CAD, NATIVE VESSEL 01/08/2008    a. Myoview 08/30/11 at [facility]: No scar, + mild apical, apical lateral, mid anterolateral ischemia, EF 49% => s/p CABG (L-LAD, S-OM)  . HYPERLIPIDEMIA-MIXED 01/08/2008  . OVERWEIGHT/OBESITY 01/08/2008  . HYPERTENSION 01/27/2009  . Anemia   . Blood transfusion ~ 2003  . Bruises easily   . Blind     both eyes removed   . Family history of breast cancer     mother  . Cellulitis late 1980's    "hospitalized; wrapped both legs; several times; no OR for this"  . Peripheral vascular disease   . ESRD (end stage renal disease) on dialysis     09/27/11 [facility]; Mon, Wed, Fri  . Pneumonia 2000;s  . DM type 2 (diabetes mellitus, type 2)   . Renal cell carcinoma     "both kidneys"  . H/O prostate cancer     seeds implant 03/2012  . Irregular heartbeat   . Colon polyps   . Dialysis patient     M-W-F  . Hypertension due to AV shunt     left forearm    Past Surgical History  Procedure Laterality Date  . Venectomy  1980's    "knees down; both legs; 2 separate times  . Foot amputation  ~ 2002    right;  partial; "infection"  .  Cholecystectomy  1992  . Colonoscopy  06/14/2011    Procedure: COLONOSCOPY;  Surgeon: [name], MD;  Location: [facility];  Service: Endoscopy;  Laterality: N/A;  . Hot hemostasis  06/14/2011    Procedure: HOT HEMOSTASIS (ARGON PLASMA COAGULATION/BICAP);  Surgeon: [name], MD;  Location: [facility];  Service: Endoscopy;  Laterality: N/A;  . Av fistula placement  02/2010    LFA  . Varicose vein surgery  mid 1980's    BLE  . Radiofrequency ablation kidney  2010-2012    "twice; one on each side; for cancer"  . Enucleation  2003; 2006    bilateral; "diabetes; pain"  . Cardiac catheterization  ~ 2001  . Coronary artery bypass graft  09/29/2011    Procedure: CORONARY ARTERY BYPASS GRAFTING (CABG);  Surgeon: [name], MD;  Location: [place];  Service: Open Heart Surgery;  Laterality: N/A;  Coronary Artery Bypass Graft times two utilizing the left internal mammary artery and the right greater saphenous vein harvested endoscopically.  . Colonoscopy N/A 07/24/2012    Procedure: COLONOSCOPY;  Surgeon: [name], MD;  Location: [facility];  Service: Endoscopy;  Laterality: N/A;  . Hip pinning,cannulated Left 12/31/2013    Procedure: CANNULATED HIP PINNING;  Surgeon: Renette Butters, MD;  Location: [place];  Service: Orthopedics;  Laterality: Left;  Carm, FX Table, [employer]    Allergies: Codeine and Tape  Medications: Prior to Admission medications   Medication Sig Start Date End Date Taking? Authorizing Provider  acetaminophen (TYLENOL) 325 MG tablet Take 2 tablets (650 mg total) by mouth every 6 (six) hours as needed for mild pain (or Fever >/= 101). 01/03/14   [name], MD  alum & mag hydroxide-simeth (MAALOX/MYLANTA) 200-200-20 MG/5ML suspension Take 30 mLs by mouth every 4 (four) hours as needed for indigestion or heartburn. 01/03/14   [name], MD  aspirin EC 325 MG EC tablet Take 1 tablet (325 mg total) by mouth daily with breakfast. 01/03/14   [name], MD    aspirin EC 325 MG tablet Take 1 tablet (325 mg total) by mouth daily. 12/31/13   Renette Butters, MD  atorvastatin (LIPITOR) 10 MG tablet Take 10 mg by mouth daily.    Historical Provider, MD  b complex-vitamin c-folic acid (NEPHRO-VITE) 0.8 MG TABS Take 0.8 mg by mouth at bedtime.    Historical Provider, MD  calcium acetate (PHOSLO) 667 MG capsule Take 2,001 mg by mouth 3 (three) times daily with meals.     Historical Provider, MD  darbepoetin (ARANESP) 25 MCG/0.42ML SOLN injection Inject 0.42 mLs (25 mcg total) into the vein every Wednesday with hemodialysis. 01/03/14   [name], MD  docusate sodium (COLACE) 100 MG capsule Take 1 capsule (100 mg total) by mouth 2 (two) times daily. Continue this while taking narcotics to help with bowel movements 12/31/13   Renette Butters, MD  docusate sodium 100 MG CAPS Take 100 mg by mouth 2 (two) times daily. 01/03/14   [name], MD  HYDROcodone-acetaminophen (NORCO) 5-325 MG per tablet Take 1-2 tablets by mouth every 4 (four) hours as needed for moderate pain. 12/31/13   Renette Butters, MD  HYDROcodone-acetaminophen (NORCO/VICODIN) 5-325 MG per tablet Take 1-2 tablets by mouth every 6 (six) hours as needed for moderate pain. 01/03/14   [name], MD  insulin aspart (NOVOLOG) 100 UNIT/ML injection Inject 0-9 Units into the skin 3 (three) times daily with meals. 01/03/14   [name], MD  midodrine (PROAMATINE) 5 MG tablet Take 1 pill daily on dialysis days prior to dialysis as directed 01/03/14   [name], MD  ondansetron (ZOFRAN) 4 MG tablet Take 1 tablet (4 mg total) by mouth every 8 (eight) hours as needed for nausea. 12/31/13   Renette Butters, MD  oxyCODONE-acetaminophen (PERCOCET/ROXICET) 5-325 MG per tablet Take 1-2 tablets by mouth every 4 (four) hours as needed for severe pain. 01/03/14   [name], MD  polyethylene glycol [facility] Floria Raveling) packet Take 17 g by mouth daily as needed for mild constipation. 01/03/14   [name], MD   zolpidem (AMBIEN) 5 MG tablet Take 1 tablet (5 mg total) by mouth at bedtime as needed and may repeat dose one time if needed for sleep. 01/03/14   [name], MD    Family History  Problem Relation Age of Onset  . Cancer    . Coronary artery disease    . Kidney disease    . Breast cancer    . Ovarian cancer    . Lung cancer    . Diabetes    . Cancer Mother 16    breast, spine and ovarian  . Emphysema Father   . Cancer Father 59    kidney, prostate  . Cancer Sister     ovarian  . Cancer  Brother     lung  . Malignant hyperthermia Neg Hx     History   Social History  . Marital Status: Married    Spouse Name: N/A    Number of Children: N/A  . Years of Education: N/A   Social History Main Topics  . Smoking status: Never Smoker   . Smokeless tobacco: Never Used  . Alcohol Use: No  . Drug Use: No  . Sexual Activity: Yes   Other Topics Concern  . Not on file   Social History Narrative  . No narrative on file    Review of Systems: A 12 point ROS discussed and pertinent positives are indicated in the HPI above.  All other systems are negative.  Review of Systems  Constitutional: Negative.   Respiratory: Negative.   Cardiovascular: Negative.   Gastrointestinal: Negative.   Genitourinary: Negative.   Musculoskeletal: Negative.   Neurological: Negative.     Vital Signs: BP 92/57 mmHg  Pulse 70  Temp(Src) 98 F (36.7 C) (Oral)  Resp 14  SpO2 94%  Physical Exam  Constitutional: He is oriented to person, place, and time. He appears well-developed and well-nourished. No distress.  Abdominal: Soft. He exhibits no distension. There is no tenderness.  Neurological: He is alert and oriented to person, place, and time.  Skin: He is not diaphoretic.    Imaging: Mr Abdomen Wo Contrast  03/05/2014   CLINICAL DATA:  63 year old male with history of prostate cancer treated with brachytherapy. Additional history of left-sided renal cell carcinomas treated with  cryoablation in December 2011, and right-sided renal cell carcinoma treated with radiofrequency ablation in January 2009.  EXAM: MRI ABDOMEN WITHOUT CONTRAST  TECHNIQUE: Multiplanar multisequence MR imaging was performed without the administration of intravenous contrast.  COMPARISON:  MRI of the abdomen an 01/03/2013.  FINDINGS: Again noted is a well-circumscribed area of architectural distortion in the retroperitoneal fat adjacent to the upper pole of the right kidney, which is unchanged, compatible with a radiofrequency ablation defect. No definite diffusion restriction in this area to suggest local recurrence of disease. Previously noted cryoablation defect adjacent to the lower pole of the left kidney is now a very small area of scarring. Multiple tiny renal lesions are noted bilaterally, T1 hypointense and T2 hyperintense, presumably tiny cysts (post gadolinium images were not obtained). No hydroureteronephrosis.  Status post cholecystectomy. No focal hepatic lesions. No intra or extrahepatic biliary ductal dilatation. Low T2 signal intensity throughout the hepatic and splenic parenchyma, in addition to loss of T1 signal intensity on in phase dual echo images, compatible with hepatic and splenic iron deposition. Spleen remains enlarged measuring 13.7 x 5.7 x 16.9 cm (estimated splenic volume of 660 mL). Bilateral adrenal glands are normal in appearance. Within the visualized peritoneal cavity there is no significant volume of ascites. Visualized portions of the lower thorax are remarkable for susceptibility artifact in the sternum related to sternotomy wires.  IMPRESSION: 1. Stable post procedural changes of radiofrequency ablation to the upper pole the right kidney and cryoablation to the lower pole the left kidney, without evidence of local recurrence of disease on today's non contrast MRI examination. 2. Hepatic and splenic iron deposition redemonstrated. 3. Persistent splenomegaly. 4. Innumerable small  cystic lesions in the kidneys bilaterally are stable compared to the prior examination, presumably tiny simple cysts.   Electronically Signed   By: Vinnie Langton M.D.   On: 03/05/2014 14:19    Labs:  CBC:  Recent Labs  01/01/14 0548 01/02/14 [id number]  01/03/14 0407 01/04/14 0450  WBC 5.7 4.8 5.1 3.7*  HGB 11.3* 10.0* 9.9* 9.4*  HCT 33.9* 29.9* 30.2* 28.1*  PLT 78* 72* 81* 89*    COAGS: No results for input(s): INR, APTT in the last 8760 hours.  BMP:  Recent Labs  01/01/14 0548 01/02/14 0612 01/03/14 0407 01/04/14 0450  NA 142 140 138 141  K 5.3 4.6 4.6 4.2  CL 95* 97 94* 100  CO2 29 31 27 28   GLUCOSE 144* 102* 100* 93  BUN 72* 45* 63* 41*  CALCIUM 9.4 9.1 9.5 8.8  CREATININE 7.53* 5.91* 7.66* 5.43*  GFRNONAA 7* 9* 7* 10*  GFRAA 8* 10* 8* 12*    LIVER FUNCTION TESTS: No results for input(s): BILITOT, AST, ALT, ALKPHOS, PROT, ALBUMIN in the last 8760 hours.  TUMOR MARKERS: No results for input(s): AFPTM, CEA, CA199, CHROMGRNA in the last 8760 hours.  Assessment and Plan: I personally reviewed MRI of the abdomen performed earlier today without contrast with the patient and his wife. The study shows stable ablation defects of the upper pole the right kidney and the lower pole of the left kidney without evidence of recurrent carcinoma. Both kidneys show stable atrophy without obstruction. No new renal lesions are identified.  Given that the patient is now 4 years post ablation of the left kidney with no evidence of recurrent carcinoma and nearly 7 years post ablation of the right kidney with no evidence of recurrent carcinoma, I recommended that we stop any routine imaging of the kidneys unless it is required for transplant purposes.  I spent a total of 15 minutes face to face in clinical consultation, greater than 50% of which was counseling/coordinating care post bilateral renal ablation.  SignedAletta Edouard T 03/05/2014, 3:39 PM

## 2014-03-13 ENCOUNTER — Encounter (HOSPITAL_COMMUNITY): Payer: Self-pay | Admitting: Cardiology

## 2014-04-07 ENCOUNTER — Telehealth: Payer: Self-pay | Admitting: Gastroenterology

## 2014-04-07 ENCOUNTER — Other Ambulatory Visit: Payer: Self-pay

## 2014-04-07 MED ORDER — NA SULFATE-K SULFATE-MG SULF 17.5-3.13-1.6 GM/177ML PO SOLN: 1.0000 | Freq: Once | ORAL | Status: AC

## 2014-04-07 NOTE — Telephone Encounter (Signed)
Colonoscopy 05/13/14. Patient's spouse aware. Declines pre-visit. Instructions and Rx mailed.

## 2014-04-29 ENCOUNTER — Ambulatory Visit: Payer: 59 | Admitting: Interventional Cardiology

## 2014-05-06 ENCOUNTER — Encounter (HOSPITAL_COMMUNITY): Payer: Self-pay | Admitting: *Deleted

## 2014-05-12 ENCOUNTER — Other Ambulatory Visit: Payer: Self-pay | Admitting: Cardiovascular Disease

## 2014-05-13 ENCOUNTER — Ambulatory Visit (HOSPITAL_COMMUNITY): Payer: Medicare Other | Admitting: Anesthesiology

## 2014-05-13 ENCOUNTER — Ambulatory Visit (HOSPITAL_COMMUNITY)
Admission: RE | Admit: 2014-05-13 | Discharge: 2014-05-13 | Disposition: A | Discharge status: Home / Self Care | Payer: Medicare Other | Source: Ambulatory Visit | Attending: Gastroenterology | Admitting: Gastroenterology

## 2014-05-13 ENCOUNTER — Encounter (HOSPITAL_COMMUNITY)
Admission: RE | Disposition: A | Discharge status: Home / Self Care | Payer: Self-pay | Source: Ambulatory Visit | Attending: Gastroenterology

## 2014-05-13 ENCOUNTER — Encounter (HOSPITAL_COMMUNITY): Payer: Self-pay | Admitting: Anesthesiology

## 2014-05-13 DIAGNOSIS — Z8701 Personal history of pneumonia (recurrent): Secondary | ICD-10-CM | POA: Diagnosis not present

## 2014-05-13 DIAGNOSIS — F329 Major depressive disorder, single episode, unspecified: Secondary | ICD-10-CM | POA: Diagnosis not present

## 2014-05-13 DIAGNOSIS — D124 Benign neoplasm of descending colon: Secondary | ICD-10-CM | POA: Diagnosis not present

## 2014-05-13 DIAGNOSIS — Z85528 Personal history of other malignant neoplasm of kidney: Secondary | ICD-10-CM | POA: Diagnosis not present

## 2014-05-13 DIAGNOSIS — K5730 Diverticulosis of large intestine without perforation or abscess without bleeding: Secondary | ICD-10-CM | POA: Diagnosis not present

## 2014-05-13 DIAGNOSIS — Z9889 Other specified postprocedural states: Secondary | ICD-10-CM | POA: Diagnosis not present

## 2014-05-13 DIAGNOSIS — E785 Hyperlipidemia, unspecified: Secondary | ICD-10-CM | POA: Insufficient documentation

## 2014-05-13 DIAGNOSIS — Z86010 Personal history of colonic polyps: Secondary | ICD-10-CM

## 2014-05-13 DIAGNOSIS — Z79899 Other long term (current) drug therapy: Secondary | ICD-10-CM | POA: Insufficient documentation

## 2014-05-13 DIAGNOSIS — I120 Hypertensive chronic kidney disease with stage 5 chronic kidney disease or end stage renal disease: Secondary | ICD-10-CM | POA: Insufficient documentation

## 2014-05-13 DIAGNOSIS — Z8546 Personal history of malignant neoplasm of prostate: Secondary | ICD-10-CM | POA: Diagnosis not present

## 2014-05-13 DIAGNOSIS — Z09 Encounter for follow-up examination after completed treatment for conditions other than malignant neoplasm: Secondary | ICD-10-CM | POA: Diagnosis present

## 2014-05-13 DIAGNOSIS — E119 Type 2 diabetes mellitus without complications: Secondary | ICD-10-CM | POA: Insufficient documentation

## 2014-05-13 DIAGNOSIS — I739 Peripheral vascular disease, unspecified: Secondary | ICD-10-CM | POA: Insufficient documentation

## 2014-05-13 DIAGNOSIS — E669 Obesity, unspecified: Secondary | ICD-10-CM | POA: Insufficient documentation

## 2014-05-13 DIAGNOSIS — I2510 Atherosclerotic heart disease of native coronary artery without angina pectoris: Secondary | ICD-10-CM | POA: Diagnosis not present

## 2014-05-13 DIAGNOSIS — Z992 Dependence on renal dialysis: Secondary | ICD-10-CM | POA: Diagnosis not present

## 2014-05-13 DIAGNOSIS — D122 Benign neoplasm of ascending colon: Secondary | ICD-10-CM | POA: Insufficient documentation

## 2014-05-13 DIAGNOSIS — N186 End stage renal disease: Secondary | ICD-10-CM | POA: Insufficient documentation

## 2014-05-13 HISTORY — PX: COLONOSCOPY WITH PROPOFOL: SHX5780

## 2014-05-13 LAB — GLUCOSE, CAPILLARY: Glucose-Capillary: 104 mg/dL — ABNORMAL HIGH (ref 70–99)

## 2014-05-13 SURGERY — COLONOSCOPY
Anesthesia: Moderate Sedation

## 2014-05-13 SURGERY — COLONOSCOPY WITH PROPOFOL
Anesthesia: Monitor Anesthesia Care

## 2014-05-13 MED ORDER — PROPOFOL 10 MG/ML IV BOLUS
INTRAVENOUS | Status: AC
  Filled 2014-05-13: qty 20

## 2014-05-13 MED ORDER — KETAMINE HCL 10 MG/ML IJ SOLN
INTRAMUSCULAR | Status: DC | PRN
  Administered 2014-05-13: 20 mg via INTRAVENOUS

## 2014-05-13 MED ORDER — GLYCOPYRROLATE 0.2 MG/ML IJ SOLN
INTRAMUSCULAR | Status: AC
  Filled 2014-05-13: qty 1

## 2014-05-13 MED ORDER — SODIUM CHLORIDE 0.9 % IV SOLN
INTRAVENOUS | Status: DC
Start: 2014-05-13 — End: 2014-05-13

## 2014-05-13 MED ORDER — EPHEDRINE SULFATE 50 MG/ML IJ SOLN
INTRAMUSCULAR | Status: DC | PRN
  Administered 2014-05-13: 10 mg via INTRAVENOUS
  Administered 2014-05-13: 5 mg via INTRAVENOUS

## 2014-05-13 MED ORDER — SODIUM CHLORIDE 0.9 % IJ SOLN
INTRAMUSCULAR | Status: AC
  Filled 2014-05-13: qty 10

## 2014-05-13 MED ORDER — PHENYLEPHRINE HCL 10 MG/ML IJ SOLN
INTRAMUSCULAR | Status: DC | PRN
  Administered 2014-05-13 (×3): 80 ug via INTRAVENOUS

## 2014-05-13 MED ORDER — SODIUM CHLORIDE 0.9 % IV SOLN
INTRAVENOUS | Status: DC | PRN
  Administered 2014-05-13: 07:00:00 via INTRAVENOUS

## 2014-05-13 MED ORDER — ONDANSETRON HCL 4 MG/2ML IJ SOLN
INTRAMUSCULAR | Status: AC
  Filled 2014-05-13: qty 2

## 2014-05-13 MED ORDER — EPHEDRINE SULFATE 50 MG/ML IJ SOLN
INTRAMUSCULAR | Status: AC
  Filled 2014-05-13: qty 1

## 2014-05-13 MED ORDER — PROPOFOL INFUSION 10 MG/ML OPTIME
INTRAVENOUS | Status: DC | PRN
  Administered 2014-05-13: 180 ug/kg/min via INTRAVENOUS

## 2014-05-13 MED ORDER — KETAMINE HCL 10 MG/ML IJ SOLN
INTRAMUSCULAR | Status: AC
  Filled 2014-05-13: qty 1

## 2014-05-13 SURGICAL SUPPLY — 22 items

## 2014-05-13 NOTE — Anesthesia Postprocedure Evaluation (Signed)
  Anesthesia Post-op Note  Patient: [name]  Procedure(s) Performed: Procedure(s) (LRB): COLONOSCOPY WITH PROPOFOL (N/A)  Patient Location: PACU  Anesthesia Type: MAC  Level of Consciousness: awake and alert   Airway and Oxygen Therapy: Patient Spontanous Breathing  Post-op Pain: mild  Post-op Assessment: Post-op Vital signs reviewed, Patient's Cardiovascular Status Stable, Respiratory Function Stable, Patent Airway and No signs of Nausea or vomiting  Last Vitals:  Filed Vitals:   05/13/14 0850  BP: 91/49  Pulse:   Temp:   Resp: 14    Post-op Vital Signs: stable   Complications: No apparent anesthesia complications

## 2014-05-13 NOTE — Op Note (Signed)
[facility] [place], [address]   COLONOSCOPY PROCEDURE REPORT     EXAM DATE: 04-Jun-2014  PATIENT NAME:      Kipp, Honer           MR #:      [id number]  BIRTHDATE:       [birth year]      VISIT #:     [id number]  ATTENDING:     [name], MD     STATUS:     outpatient REFERRING MD:      Janalyn Rouse, M.D. ASA CLASS:        Class III  INDICATIONS:  The patient is a 64 yr old male here for a colonoscopy due to high risk patient with personal history of colonic polyps. Multiple adenomatous polyps 2014 PROCEDURE PERFORMED:     Colonoscopy with snare polypectomy MEDICATIONS:     Monitored anesthesia care ESTIMATED BLOOD LOSS:     None  CONSENT: The patient understands the risks and benefits of the procedure and understands that these risks include, but are not limited to: sedation, allergic reaction, infection, perforation and/or bleeding. Alternative means of evaluation and treatment include, among others: physical exam, x-rays, and/or surgical intervention. The patient elects to proceed with this endoscopic procedure.  DESCRIPTION OF PROCEDURE: During intra-op preparation period all mechanical & medical equipment was checked for proper function. Hand hygiene and appropriate measures for infection prevention was taken. After the risks, benefits and alternatives of the procedure were thoroughly explained, Informed consent was verified, confirmed and timeout was successfully executed by the treatment team. A digital exam revealed no abnormalities of the rectum.      The Pentax Colonoscope [id number] endoscope was introduced through the anus and advanced to the cecum, which was identified by both the appendix and ileocecal valve. No adverse events experienced. The prep was Suprep fair. The instrument was then slowly withdrawn as the colon was fully examined. Retroflexed views revealed no abnormalities.  The scope was  then completely withdrawn from the patient and the procedure terminated.  WITHDRAWAL TIME: 19 minutes 0 seconds    ADVERSE EVENTS:      There were no immediate complications.  IMPRESSIONS:     1.  Two sessile polyps ranging from 3 to 77mm in size were found in the ascending colon; polypectomy was performed with a cold snare 2.  Sessile polyp was found in the descending colon; polypectomy was performed using snare cautery 3.  Mild diverticulosis was noted in the sigmoid colon  RECOMMENDATIONS:     Colonoscopy 3 years RECALL:  [name], MD eSigned:  [name], MD 06/04/14 8:38 AM   cc:  CPT CODES: ICD CODES:  The ICD and CPT codes recommended by this software are interpretations from the data that the clinical staff has captured with the software.  The verification of the translation of this report to the ICD and CPT codes and modifiers is the sole responsibility of the health care institution and practicing physician where this report was generated.  [place]. will not be held responsible for the validity of the ICD and CPT codes included on this report.  AMA assumes no liability for data contained or not contained herein. CPT is a Designer, television/film set of the [employer].   PATIENT NAME:  Jarrius, Schoff MR#: [id number]

## 2014-05-13 NOTE — Discharge Instructions (Signed)

## 2014-05-13 NOTE — H&P (Signed)
_                                                                                                                History of Present Illness:  Mr. [name] is a 64 year old white male with multiple medical problems listed below for follow-up colonoscopy.  In 2014 multiple adenomatous polyps were removed at colonoscopy.  Patient offers no GI complaints.   Past Medical History  Diagnosis Date  . CAD, NATIVE VESSEL 01/08/2008    a. Myoview 08/30/11 at [facility]: No scar, + mild apical, apical lateral, mid anterolateral ischemia, EF 49% => s/p CABG (L-LAD, S-OM)  . HYPERLIPIDEMIA-MIXED 01/08/2008  . OVERWEIGHT/OBESITY 01/08/2008  . HYPERTENSION 01/27/2009  . Anemia   . Blood transfusion ~ 2003  . Bruises easily   . Blind     both eyes removed   . Family history of breast cancer     mother  . Cellulitis late 1980's    "hospitalized; wrapped both legs; several times; no OR for this"  . Peripheral vascular disease   . ESRD (end stage renal disease) on dialysis     09/27/11 [facility]; Mon, Wed, Fri  . Pneumonia 2000;s  . DM type 2 (diabetes mellitus, type 2)   . H/O prostate cancer     seeds implant 03/2012  . Irregular heartbeat   . Colon polyps   . Dialysis patient     M[facility] [address]  . Hypertension due to AV shunt     left forearm  . Renal cell carcinoma 2001 and 2003    "both kidneys"   Past Surgical History  Procedure Laterality Date  . Venectomy  1980's    "knees down; both legs; 2 separate times  . Foot amputation  ~ 2002    right;  partial; "infection"  . Cholecystectomy  1992  . Colonoscopy  06/14/2011    Procedure: COLONOSCOPY;  Surgeon: [name], MD;  Location: [facility];  Service: Endoscopy;  Laterality: N/A;  . Hot hemostasis  06/14/2011    Procedure: HOT HEMOSTASIS (ARGON PLASMA COAGULATION/BICAP);  Surgeon: [name], MD;  Location: [facility];  Service: Endoscopy;  Laterality: N/A;  . Av fistula placement  02/2010    LFA  .  Varicose vein surgery  mid 1980's    BLE  . Radiofrequency ablation kidney  2010-2012    "twice; one on each side; for cancer"  . Enucleation  2003; 2006    bilateral; "diabetes; pain"  . Cardiac catheterization  ~ 2001  . Coronary artery bypass graft  09/29/2011    Procedure: CORONARY ARTERY BYPASS GRAFTING (CABG);  Surgeon: [name], MD;  Location: [place];  Service: Open Heart Surgery;  Laterality: N/A;  Coronary Artery Bypass Graft times two utilizing the left internal mammary artery and the right greater saphenous vein harvested endoscopically.  . Colonoscopy N/A 07/24/2012    Procedure: COLONOSCOPY;  Surgeon: [name], MD;  Location: [facility];  Service: Endoscopy;  Laterality: N/A;  .  Hip pinning,cannulated Left 12/31/2013    Procedure: CANNULATED HIP PINNING;  Surgeon: Renette Butters, MD;  Location: [place];  Service: Orthopedics;  Laterality: Left;  Carm, FX Table, [employer]  . Left heart catheterization with coronary angiogram N/A 09/27/2011    Procedure: LEFT HEART CATHETERIZATION WITH CORONARY ANGIOGRAM;  Surgeon: [name], MD;  Location: [facility] CATH LAB;  Service: Cardiovascular;  Laterality: N/A;   family history includes Breast cancer in an other family member; Cancer in his brother, sister, and another family member; Cancer (age of onset: 26) in his father; Cancer (age of onset: 28) in his mother; Coronary artery disease in an other family member; Diabetes in an other family member; Emphysema in his father; Kidney disease in an other family member; Lung cancer in an other family member; Ovarian cancer in an other family member. There is no history of Malignant hyperthermia. Current Facility-Administered Medications  Medication Dose Route Frequency Provider Last Rate Last Dose  . 0.9 %  sodium chloride infusion   Intravenous Continuous [name], MD       Facility-Administered Medications Ordered in Other Encounters  Medication Dose Route Frequency Provider Last  Rate Last Dose  . 0.9 %  sodium chloride infusion    Continuous PRN Kristopher Key, CRNA       Allergies as of 04/07/2014 - Review Complete 03/05/2014  Allergen Reaction Noted  . Codeine Other (See Comments)   . Tape Other (See Comments) 09/22/2011    reports that he has never smoked. He has never used smokeless tobacco. He reports that he does not drink alcohol or use illicit drugs.   Review of Systems: Pertinent positive and negative review of systems were noted in the above HPI section. All other review of systems were otherwise negative.  Vital signs were reviewed in today's medical record Physical Exam: General: Well developed , well nourished, no acute distress Skin: anicteric Head: Normocephalic and atraumatic Eyes:  sclerae anicteric, EOMI Ears: Normal auditory acuity Mouth: No deformity or lesions Neck: Supple, no masses or thyromegaly Lungs: Clear throughout to auscultation Heart: Regular rate and rhythm; no murmurs, rubs or bruits Abdomen: Soft, non tender and non distended. No masses, hepatosplenomegaly or hernias noted. Normal Bowel sounds Rectal:deferred Musculoskeletal: Symmetrical with no gross deformities  Skin: No lesions on visible extremities Pulses:  Normal pulses noted Extremities: No clubbing, cyanosis, edema or deformities noted Neurological: Alert oriented x 4, grossly nonfocal Cervical Nodes:  No significant cervical adenopathy Inguinal Nodes: No significant inguinal adenopathy Psychological:  Alert and cooperative. Normal mood and affect  Impression -  1.  History of colonic polyposis 2.  Multiple medical comorbidities-stable  Plan follow-up colonoscopy

## 2014-05-13 NOTE — Transfer of Care (Signed)
Immediate Anesthesia Transfer of Care Note  Patient: [name]  Procedure(s) Performed: Procedure(s): COLONOSCOPY WITH PROPOFOL (N/A)  Patient Location: Endoscopy Unit  Anesthesia Type:MAC  Level of Consciousness: awake, alert  and oriented  Airway & Oxygen Therapy: Patient Spontanous Breathing and Patient connected to face mask oxygen  Post-op Assessment: Report given to RN  Post vital signs: Reviewed and stable  Last Vitals:  Filed Vitals:   05/13/14 0701  BP: 112/70  Pulse: 65  Temp: 36.5 C  Resp: 16    Complications: No apparent anesthesia complications

## 2014-05-13 NOTE — Anesthesia Preprocedure Evaluation (Signed)
Anesthesia Evaluation  Patient identified by MRN, date of birth, ID band Patient awake    Reviewed: Allergy & Precautions, NPO status , Patient's Chart, lab work & pertinent test results  Airway Mallampati: II  TM Distance: >3 FB Neck ROM: Full    Dental no notable dental hx.    Pulmonary pneumonia -, resolved,  breath sounds clear to auscultation  Pulmonary exam normal       Cardiovascular Exercise Tolerance: Good hypertension, Pt. on medications + CAD and + Peripheral Vascular Disease Rhythm:Regular Rate:Normal     Neuro/Psych PSYCHIATRIC DISORDERS Depression negative neurological ROS     GI/Hepatic negative GI ROS, Neg liver ROS,   Endo/Other  diabetes, Type 2, Oral Hypoglycemic Agents  Renal/GU Dialysis and ESRFRenal disease  negative genitourinary   Musculoskeletal negative musculoskeletal ROS (+)   Abdominal   Peds negative pediatric ROS (+)  Hematology  (+) anemia ,   Anesthesia Other Findings   Reproductive/Obstetrics negative OB ROS                             Anesthesia Physical Anesthesia Plan  ASA: III  Anesthesia Plan: MAC   Post-op Pain Management:    Induction: Intravenous  Airway Management Planned:   Additional Equipment:   Intra-op Plan:   Post-operative Plan:   Informed Consent: I have reviewed the patients History and Physical, chart, labs and discussed the procedure including the risks, benefits and alternatives for the proposed anesthesia with the patient or authorized representative who has indicated his/her understanding and acceptance.   Dental advisory given  Plan Discussed with: CRNA  Anesthesia Plan Comments:         Anesthesia Quick Evaluation

## 2014-05-14 ENCOUNTER — Encounter (HOSPITAL_COMMUNITY): Payer: Self-pay | Admitting: Gastroenterology

## 2014-05-16 ENCOUNTER — Encounter: Payer: Self-pay | Admitting: Gastroenterology

## 2014-06-23 ENCOUNTER — Encounter (HOSPITAL_COMMUNITY): Payer: Self-pay | Admitting: Gastroenterology

## 2014-06-23 NOTE — Addendum Note (Signed)
Addendum  created 06/23/14 1552 by Franne Grip, MD   Modules edited: Anesthesia Events, Narrator   Narrator:  Narrator: Event Log Edited

## 2014-06-25 ENCOUNTER — Telehealth: Payer: Self-pay | Admitting: Cardiology

## 2014-06-25 NOTE — Telephone Encounter (Signed)
Received records from [facility] for appointment with Dr Percival Spanish on 07/31/14.  Records given to [facility] (medical records) for Dr Hochrein's schedule on 07/31/14. lp

## 2014-07-14 ENCOUNTER — Other Ambulatory Visit (HOSPITAL_COMMUNITY): Payer: Self-pay | Admitting: Nephrology

## 2014-07-14 DIAGNOSIS — N186 End stage renal disease: Secondary | ICD-10-CM

## 2014-07-14 DIAGNOSIS — Z992 Dependence on renal dialysis: Principal | ICD-10-CM

## 2014-07-17 ENCOUNTER — Ambulatory Visit (HOSPITAL_COMMUNITY)
Admission: RE | Admit: 2014-07-17 | Discharge: 2014-07-17 | Disposition: A | Discharge status: Home / Self Care | Payer: Medicare Other | Source: Ambulatory Visit | Attending: Interventional Radiology | Admitting: Interventional Radiology

## 2014-07-17 DIAGNOSIS — T82898A Other specified complication of vascular prosthetic devices, implants and grafts, initial encounter: Secondary | ICD-10-CM | POA: Diagnosis present

## 2014-07-17 DIAGNOSIS — Y832 Surgical operation with anastomosis, bypass or graft as the cause of abnormal reaction of the patient, or of later complication, without mention of misadventure at the time of the procedure: Secondary | ICD-10-CM | POA: Diagnosis not present

## 2014-07-17 DIAGNOSIS — N186 End stage renal disease: Secondary | ICD-10-CM | POA: Diagnosis not present

## 2014-07-17 DIAGNOSIS — Z992 Dependence on renal dialysis: Secondary | ICD-10-CM

## 2014-07-17 MED ORDER — IOHEXOL 300 MG/ML  SOLN
100.0000 mL | Freq: Once | INTRAMUSCULAR | Status: AC | PRN
  Administered 2014-07-17: 40 mL via INTRAVENOUS

## 2014-07-24 ENCOUNTER — Ambulatory Visit (INDEPENDENT_AMBULATORY_CARE_PROVIDER_SITE_OTHER): Payer: Medicare Other | Admitting: Vascular Surgery

## 2014-07-24 ENCOUNTER — Other Ambulatory Visit: Payer: Self-pay

## 2014-07-24 ENCOUNTER — Encounter: Payer: Self-pay | Admitting: Vascular Surgery

## 2014-07-24 VITALS — BP 100/63 | HR 64 | Ht 76.0 in | Wt 221.0 lb

## 2014-07-24 DIAGNOSIS — Z992 Dependence on renal dialysis: Secondary | ICD-10-CM | POA: Diagnosis not present

## 2014-07-24 DIAGNOSIS — N186 End stage renal disease: Secondary | ICD-10-CM

## 2014-07-24 NOTE — Progress Notes (Signed)
Patient is a 64 year old male referred by Dr. Jimmy Footman for possibility of side branch ligation for his left arm AV fistula. The patient states he had the fistula placed 4 years ago. It is a left radiocephalic AV fistula. He states that the flow has decreased over the last 4-6 weeks. He recently had a fistulogram at [facility] which suggested side branches may be diminishing blood flow through the fistula. There was no narrowing in the native cephalic vein or in the arterial anastomosis. The patient denies any steal symptoms. He is right-handed.  Physical exam:  Filed Vitals:   07/24/14 1009  BP: 100/63  Pulse: 64  Height: 6\' 4"  (1.93 m)  Weight: 221 lb (100.245 kg)  SpO2: 100%    Left upper extremity: Palpable thrill left arm AV fistula fistula is palpable throughout the forearm. There are some collateral branches near the antecubital region and parallel to the fistula in the left forearm. The flow in the fistula does increase slightly with compression of these.  Data: I reviewed the images of the patient's fistulogram today. There are several large branches near the antecubital level. However, this is most likely normal anatomic variant with the basilic system and a normal antecubital system. The cephalic vein is patent throughout its course with minimal side branches in the upper arm and no significant side branches in the left forearm. This is widely patent.  Assessment: Poor flow left radiocephalic AV fistula with no obvious anastomotic narrowing. Although the patient does have some patent side branches, these are mostly near the antecubital level. I do not believe that this necessarily is the reason for low flow in the fistula. However the only other option to increase flow would be to consider a new AV fistula. Since that is the case, we will take him to the operating room and ligate all side branches in the left forearm AV fistula to see if this improves flow. If not we will need to  consider a new access. All this was discussed with the patient and his wife today. Ligation of side branches of left arm AV fistula scheduled for 07/29/2014. I discussed with the patient today there is a 50% chance that this will improve flow in his fistula.  Ruta Hinds, MD [facility] Office: [phone] Pager: [phone]

## 2014-07-25 ENCOUNTER — Encounter (HOSPITAL_COMMUNITY): Payer: Self-pay | Admitting: *Deleted

## 2014-07-28 ENCOUNTER — Other Ambulatory Visit: Payer: Self-pay | Admitting: Urology

## 2014-07-28 ENCOUNTER — Inpatient Hospital Stay (HOSPITAL_COMMUNITY)
Admission: EM | Admit: 2014-07-28 | Discharge: 2014-07-29 | DRG: 694 | Disposition: A | Discharge status: Home / Self Care | Payer: Medicare Other | Attending: Internal Medicine | Admitting: Internal Medicine

## 2014-07-28 ENCOUNTER — Other Ambulatory Visit: Payer: Self-pay

## 2014-07-28 ENCOUNTER — Inpatient Hospital Stay (HOSPITAL_COMMUNITY): Payer: Medicare Other | Admitting: Anesthesiology

## 2014-07-28 ENCOUNTER — Encounter (HOSPITAL_COMMUNITY): Payer: Self-pay | Admitting: Emergency Medicine

## 2014-07-28 ENCOUNTER — Emergency Department (HOSPITAL_COMMUNITY): Payer: Medicare Other

## 2014-07-28 ENCOUNTER — Encounter (HOSPITAL_COMMUNITY)
Admission: EM | Disposition: A | Discharge status: Home / Self Care | Payer: Self-pay | Source: Home / Self Care | Attending: Internal Medicine

## 2014-07-28 DIAGNOSIS — E875 Hyperkalemia: Secondary | ICD-10-CM | POA: Diagnosis present

## 2014-07-28 DIAGNOSIS — T82898A Other specified complication of vascular prosthetic devices, implants and grafts, initial encounter: Secondary | ICD-10-CM | POA: Diagnosis not present

## 2014-07-28 DIAGNOSIS — Z951 Presence of aortocoronary bypass graft: Secondary | ICD-10-CM | POA: Diagnosis not present

## 2014-07-28 DIAGNOSIS — R52 Pain, unspecified: Secondary | ICD-10-CM | POA: Diagnosis present

## 2014-07-28 DIAGNOSIS — Z8546 Personal history of malignant neoplasm of prostate: Secondary | ICD-10-CM | POA: Diagnosis not present

## 2014-07-28 DIAGNOSIS — N131 Hydronephrosis with ureteral stricture, not elsewhere classified: Principal | ICD-10-CM | POA: Diagnosis present

## 2014-07-28 DIAGNOSIS — D631 Anemia in chronic kidney disease: Secondary | ICD-10-CM | POA: Diagnosis present

## 2014-07-28 DIAGNOSIS — Z992 Dependence on renal dialysis: Secondary | ICD-10-CM

## 2014-07-28 DIAGNOSIS — E785 Hyperlipidemia, unspecified: Secondary | ICD-10-CM | POA: Diagnosis present

## 2014-07-28 DIAGNOSIS — M1388 Other specified arthritis, other site: Secondary | ICD-10-CM | POA: Diagnosis present

## 2014-07-28 DIAGNOSIS — Z7982 Long term (current) use of aspirin: Secondary | ICD-10-CM

## 2014-07-28 DIAGNOSIS — I2510 Atherosclerotic heart disease of native coronary artery without angina pectoris: Secondary | ICD-10-CM | POA: Diagnosis present

## 2014-07-28 DIAGNOSIS — H540 Blindness, both eyes: Secondary | ICD-10-CM | POA: Diagnosis present

## 2014-07-28 DIAGNOSIS — R1115 Cyclical vomiting syndrome unrelated to migraine: Secondary | ICD-10-CM | POA: Insufficient documentation

## 2014-07-28 DIAGNOSIS — D696 Thrombocytopenia, unspecified: Secondary | ICD-10-CM | POA: Diagnosis present

## 2014-07-28 DIAGNOSIS — I739 Peripheral vascular disease, unspecified: Secondary | ICD-10-CM | POA: Diagnosis present

## 2014-07-28 DIAGNOSIS — Z794 Long term (current) use of insulin: Secondary | ICD-10-CM

## 2014-07-28 DIAGNOSIS — I120 Hypertensive chronic kidney disease with stage 5 chronic kidney disease or end stage renal disease: Secondary | ICD-10-CM | POA: Diagnosis present

## 2014-07-28 DIAGNOSIS — Z85528 Personal history of other malignant neoplasm of kidney: Secondary | ICD-10-CM

## 2014-07-28 DIAGNOSIS — N1330 Unspecified hydronephrosis: Secondary | ICD-10-CM | POA: Diagnosis present

## 2014-07-28 DIAGNOSIS — I9589 Other hypotension: Secondary | ICD-10-CM | POA: Diagnosis present

## 2014-07-28 DIAGNOSIS — N186 End stage renal disease: Secondary | ICD-10-CM | POA: Diagnosis present

## 2014-07-28 DIAGNOSIS — I509 Heart failure, unspecified: Secondary | ICD-10-CM | POA: Diagnosis present

## 2014-07-28 HISTORY — PX: CYSTOSCOPY WITH RETROGRADE PYELOGRAM, URETEROSCOPY AND STENT PLACEMENT: SHX5789

## 2014-07-28 HISTORY — DX: Malignant neoplasm of prostate: C61

## 2014-07-28 HISTORY — DX: Disorder of kidney and ureter, unspecified: N28.9

## 2014-07-28 HISTORY — DX: Unspecified osteoarthritis, unspecified site: M19.90

## 2014-07-28 LAB — CREATININE, SERUM
CREATININE: 10.32 mg/dL — AB (ref 0.50–1.35)
GFR calc Af Amer: 5 mL/min — ABNORMAL LOW (ref >90)
GFR calc non Af Amer: 5 mL/min — ABNORMAL LOW (ref >90)

## 2014-07-28 LAB — COMPREHENSIVE METABOLIC PANEL
ALT: 13 U/L (ref 0–53)
ANION GAP: 20 — AB (ref 5–15)
AST: 12 U/L (ref 0–37)
Albumin: 4 g/dL (ref 3.5–5.2)
Alkaline Phosphatase: 77 U/L (ref 39–117)
BUN: 144 mg/dL — ABNORMAL HIGH (ref 6–23)
CO2: 24 mmol/L (ref 19–32)
Calcium: 10.2 mg/dL (ref 8.4–10.5)
Chloride: 94 mmol/L — ABNORMAL LOW (ref 96–112)
Creatinine, Ser: 9.83 mg/dL — ABNORMAL HIGH (ref 0.50–1.35)
GFR calc Af Amer: 6 mL/min — ABNORMAL LOW (ref >90)
GFR, EST NON AFRICAN AMERICAN: 5 mL/min — AB (ref >90)
GLUCOSE: 208 mg/dL — AB (ref 70–99)
Potassium: 5.5 mmol/L — ABNORMAL HIGH (ref 3.5–5.1)
SODIUM: 138 mmol/L (ref 135–145)
Total Bilirubin: 0.7 mg/dL (ref 0.3–1.2)
Total Protein: 7.3 g/dL (ref 6.0–8.3)

## 2014-07-28 LAB — URINALYSIS, ROUTINE W REFLEX MICROSCOPIC
BILIRUBIN URINE: NEGATIVE
Glucose, UA: 100 mg/dL — AB
HGB URINE DIPSTICK: NEGATIVE
KETONES UR: NEGATIVE mg/dL
Leukocytes, UA: NEGATIVE
NITRITE: NEGATIVE
PROTEIN: 30 mg/dL — AB
Specific Gravity, Urine: 1.012 (ref 1.005–1.030)
Urobilinogen, UA: 0.2 mg/dL (ref 0.0–1.0)
pH: 7 (ref 5.0–8.0)

## 2014-07-28 LAB — CBG MONITORING, ED: Glucose-Capillary: 155 mg/dL — ABNORMAL HIGH (ref 70–99)

## 2014-07-28 LAB — I-STAT CHEM 8, ED
BUN: 135 mg/dL — ABNORMAL HIGH (ref 6–23)
CALCIUM ION: 1.2 mmol/L (ref 1.13–1.30)
Chloride: 99 mmol/L (ref 96–112)
Creatinine, Ser: 9.1 mg/dL — ABNORMAL HIGH (ref 0.50–1.35)
GLUCOSE: 210 mg/dL — AB (ref 70–99)
HEMATOCRIT: 36 % — AB (ref 39.0–52.0)
HEMOGLOBIN: 12.2 g/dL — AB (ref 13.0–17.0)
Potassium: 5.4 mmol/L — ABNORMAL HIGH (ref 3.5–5.1)
Sodium: 137 mmol/L (ref 135–145)
TCO2: 25 mmol/L (ref 0–100)

## 2014-07-28 LAB — CBC WITH DIFFERENTIAL/PLATELET
BASOS ABS: 0 10*3/uL (ref 0.0–0.1)
Basophils Relative: 0 % (ref 0–1)
EOS PCT: 2 % (ref 0–5)
Eosinophils Absolute: 0.1 10*3/uL (ref 0.0–0.7)
HCT: 36.6 % — ABNORMAL LOW (ref 39.0–52.0)
HEMOGLOBIN: 12.5 g/dL — AB (ref 13.0–17.0)
LYMPHS ABS: 0.6 10*3/uL — AB (ref 0.7–4.0)
Lymphocytes Relative: 11 % — ABNORMAL LOW (ref 12–46)
MCH: 33.5 pg (ref 26.0–34.0)
MCHC: 34.2 g/dL (ref 30.0–36.0)
MCV: 98.1 fL (ref 78.0–100.0)
MONOS PCT: 4 % (ref 3–12)
Monocytes Absolute: 0.2 10*3/uL (ref 0.1–1.0)
NEUTROS PCT: 83 % — AB (ref 43–77)
Neutro Abs: 4.7 10*3/uL (ref 1.7–7.7)
Platelets: 97 10*3/uL — ABNORMAL LOW (ref 150–400)
RBC: 3.73 MIL/uL — ABNORMAL LOW (ref 4.22–5.81)
RDW: 13.1 % (ref 11.5–15.5)
WBC: 5.7 10*3/uL (ref 4.0–10.5)

## 2014-07-28 LAB — GLUCOSE, CAPILLARY
GLUCOSE-CAPILLARY: 111 mg/dL — AB (ref 70–99)
Glucose-Capillary: 106 mg/dL — ABNORMAL HIGH (ref 70–99)
Glucose-Capillary: 106 mg/dL — ABNORMAL HIGH (ref 70–99)

## 2014-07-28 LAB — CBC
HCT: 34.3 % — ABNORMAL LOW (ref 39.0–52.0)
Hemoglobin: 11.7 g/dL — ABNORMAL LOW (ref 13.0–17.0)
MCH: 33.4 pg (ref 26.0–34.0)
MCHC: 34.1 g/dL (ref 30.0–36.0)
MCV: 98 fL (ref 78.0–100.0)
Platelets: 95 10*3/uL — ABNORMAL LOW (ref 150–400)
RBC: 3.5 MIL/uL — ABNORMAL LOW (ref 4.22–5.81)
RDW: 13.3 % (ref 11.5–15.5)
WBC: 5.9 10*3/uL (ref 4.0–10.5)

## 2014-07-28 LAB — URINE MICROSCOPIC-ADD ON

## 2014-07-28 LAB — SURGICAL PCR SCREEN
MRSA, PCR: NEGATIVE
Staphylococcus aureus: NEGATIVE

## 2014-07-28 LAB — PROTIME-INR
INR: 0.98 (ref 0.00–1.49)
Prothrombin Time: 13.1 seconds (ref 11.6–15.2)

## 2014-07-28 LAB — I-STAT CG4 LACTIC ACID, ED: Lactic Acid, Venous: 0.92 mmol/L (ref 0.5–2.0)

## 2014-07-28 LAB — LIPASE, BLOOD: Lipase: 76 U/L — ABNORMAL HIGH (ref 11–59)

## 2014-07-28 SURGERY — CYSTOURETEROSCOPY, WITH RETROGRADE PYELOGRAM AND STENT INSERTION
Anesthesia: General | Laterality: Left

## 2014-07-28 MED ORDER — PHENYLEPHRINE HCL 10 MG/ML IJ SOLN
INTRAMUSCULAR | Status: DC | PRN
  Administered 2014-07-28: 120 ug via INTRAVENOUS
  Administered 2014-07-28: 80 ug via INTRAVENOUS

## 2014-07-28 MED ORDER — HYDROMORPHONE HCL 1 MG/ML IJ SOLN
1.0000 mg | Freq: Once | INTRAMUSCULAR | Status: AC
  Administered 2014-07-28: 1 mg via INTRAVENOUS
  Filled 2014-07-28: qty 1

## 2014-07-28 MED ORDER — CHLORHEXIDINE GLUCONATE CLOTH 2 % EX PADS: 6.0000 | MEDICATED_PAD | Freq: Once | CUTANEOUS | Status: DC

## 2014-07-28 MED ORDER — MIDODRINE HCL 5 MG PO TABS
10.0000 mg | ORAL_TABLET | Freq: Two times a day (BID) | ORAL | Status: DC
  Administered 2014-07-28 – 2014-07-29 (×3): 10 mg via ORAL
  Filled 2014-07-28 (×4): qty 2

## 2014-07-28 MED ORDER — OXYCODONE HCL 5 MG PO TABS: 5.0000 mg | ORAL_TABLET | ORAL | Status: DC | PRN

## 2014-07-28 MED ORDER — SODIUM CHLORIDE 0.9 % IV SOLN: INTRAVENOUS | Status: DC

## 2014-07-28 MED ORDER — SODIUM CHLORIDE 0.9 % IJ SOLN
3.0000 mL | Freq: Two times a day (BID) | INTRAMUSCULAR | Status: DC
  Administered 2014-07-28 – 2014-07-29 (×3): 3 mL via INTRAVENOUS

## 2014-07-28 MED ORDER — IOHEXOL 300 MG/ML  SOLN
INTRAMUSCULAR | Status: DC | PRN
  Administered 2014-07-28: 25 mL

## 2014-07-28 MED ORDER — ONDANSETRON HCL 4 MG PO TABS: 4.0000 mg | ORAL_TABLET | Freq: Four times a day (QID) | ORAL | Status: DC | PRN

## 2014-07-28 MED ORDER — ASPIRIN 81 MG PO CHEW
81.0000 mg | CHEWABLE_TABLET | Freq: Every day | ORAL | Status: DC
  Administered 2014-07-28 – 2014-07-29 (×2): 81 mg via ORAL
  Filled 2014-07-28 (×2): qty 1

## 2014-07-28 MED ORDER — SODIUM CHLORIDE 0.9 % IV BOLUS (SEPSIS)
500.0000 mL | Freq: Once | INTRAVENOUS | Status: AC
  Administered 2014-07-28: 500 mL via INTRAVENOUS

## 2014-07-28 MED ORDER — HEPARIN SODIUM (PORCINE) 5000 UNIT/ML IJ SOLN
5000.0000 [IU] | Freq: Three times a day (TID) | INTRAMUSCULAR | Status: DC
  Filled 2014-07-28 (×3): qty 1

## 2014-07-28 MED ORDER — EPHEDRINE SULFATE 50 MG/ML IJ SOLN
INTRAMUSCULAR | Status: DC | PRN
  Administered 2014-07-28 (×2): 15 mg via INTRAVENOUS

## 2014-07-28 MED ORDER — DEXTROSE 5 % IV SOLN
1.5000 g | INTRAVENOUS | Status: DC
  Filled 2014-07-28: qty 1.5

## 2014-07-28 MED ORDER — ONDANSETRON HCL 4 MG/2ML IJ SOLN: 4.0000 mg | Freq: Four times a day (QID) | INTRAMUSCULAR | Status: DC | PRN

## 2014-07-28 MED ORDER — ETOMIDATE 2 MG/ML IV SOLN
INTRAVENOUS | Status: DC | PRN
  Administered 2014-07-28: 10 mg via INTRAVENOUS

## 2014-07-28 MED ORDER — FENTANYL CITRATE (PF) 100 MCG/2ML IJ SOLN
INTRAMUSCULAR | Status: AC
  Filled 2014-07-28: qty 2

## 2014-07-28 MED ORDER — ATORVASTATIN CALCIUM 10 MG PO TABS
10.0000 mg | ORAL_TABLET | Freq: Every day | ORAL | Status: DC
  Administered 2014-07-28 – 2014-07-29 (×2): 10 mg via ORAL
  Filled 2014-07-28 (×2): qty 1

## 2014-07-28 MED ORDER — SODIUM CHLORIDE 0.9 % IJ SOLN
INTRAMUSCULAR | Status: AC
  Filled 2014-07-28: qty 10

## 2014-07-28 MED ORDER — FENTANYL CITRATE (PF) 100 MCG/2ML IJ SOLN
25.0000 ug | INTRAMUSCULAR | Status: DC | PRN
Start: 2014-07-28 — End: 2014-07-28

## 2014-07-28 MED ORDER — RENA-VITE PO TABS
1.0000 | ORAL_TABLET | Freq: Every day | ORAL | Status: DC
  Administered 2014-07-28: 1 via ORAL
  Filled 2014-07-28 (×2): qty 1

## 2014-07-28 MED ORDER — EPHEDRINE SULFATE 50 MG/ML IJ SOLN
INTRAMUSCULAR | Status: AC
  Filled 2014-07-28: qty 1

## 2014-07-28 MED ORDER — PROMETHAZINE HCL 25 MG/ML IJ SOLN: 6.2500 mg | INTRAMUSCULAR | Status: DC | PRN

## 2014-07-28 MED ORDER — PHENYLEPHRINE 40 MCG/ML (10ML) SYRINGE FOR IV PUSH (FOR BLOOD PRESSURE SUPPORT)
PREFILLED_SYRINGE | INTRAVENOUS | Status: AC
  Filled 2014-07-28: qty 10

## 2014-07-28 MED ORDER — IOHEXOL 300 MG/ML  SOLN
25.0000 mL | INTRAMUSCULAR | Status: AC
  Administered 2014-07-28: 25 mL via ORAL

## 2014-07-28 MED ORDER — FENTANYL CITRATE (PF) 100 MCG/2ML IJ SOLN
INTRAMUSCULAR | Status: DC | PRN
Start: 2014-07-28 — End: 2014-07-28
  Administered 2014-07-28: 50 ug via INTRAVENOUS

## 2014-07-28 MED ORDER — TAMSULOSIN HCL 0.4 MG PO CAPS
0.4000 mg | ORAL_CAPSULE | Freq: Every day | ORAL | Status: DC
  Administered 2014-07-28 – 2014-07-29 (×2): 0.4 mg via ORAL
  Filled 2014-07-28 (×2): qty 1

## 2014-07-28 MED ORDER — DEXTROSE 5 % IV SOLN
1.5000 g | INTRAVENOUS | Status: AC
  Administered 2014-07-28: 1.5 g via INTRAVENOUS
  Filled 2014-07-28: qty 1.5

## 2014-07-28 MED ORDER — MORPHINE SULFATE 2 MG/ML IJ SOLN
1.0000 mg | INTRAMUSCULAR | Status: DC | PRN
  Administered 2014-07-28: 1 mg via INTRAVENOUS
  Filled 2014-07-28: qty 1

## 2014-07-28 MED ORDER — 0.9 % SODIUM CHLORIDE (POUR BTL) OPTIME
TOPICAL | Status: DC | PRN
  Administered 2014-07-28: 1000 mL

## 2014-07-28 MED ORDER — ONDANSETRON HCL 4 MG/2ML IJ SOLN
4.0000 mg | Freq: Once | INTRAMUSCULAR | Status: AC
  Administered 2014-07-28: 4 mg via INTRAVENOUS
  Filled 2014-07-28: qty 2

## 2014-07-28 MED ORDER — SODIUM CHLORIDE 0.9 % IV SOLN
INTRAVENOUS | Status: DC
  Administered 2014-07-28: 18:00:00 via INTRAVENOUS

## 2014-07-28 MED ORDER — INSULIN ASPART 100 UNIT/ML ~~LOC~~ SOLN: 0.0000 [IU] | Freq: Three times a day (TID) | SUBCUTANEOUS | Status: DC

## 2014-07-28 MED ORDER — SEVELAMER CARBONATE 800 MG PO TABS
1600.0000 mg | ORAL_TABLET | Freq: Three times a day (TID) | ORAL | Status: DC
  Administered 2014-07-29: 1600 mg via ORAL
  Filled 2014-07-28 (×3): qty 2

## 2014-07-28 MED ORDER — SODIUM CHLORIDE 0.9 % IV SOLN: 62.5000 mg | INTRAVENOUS | Status: DC

## 2014-07-28 MED ORDER — PROPOFOL 10 MG/ML IV BOLUS
INTRAVENOUS | Status: DC | PRN
  Administered 2014-07-28: 100 mg via INTRAVENOUS

## 2014-07-28 MED ORDER — SODIUM CHLORIDE 0.9 % IR SOLN
Status: DC | PRN
  Administered 2014-07-28: 3000 mL

## 2014-07-28 MED ORDER — CALCIUM ACETATE 667 MG PO CAPS
2001.0000 mg | ORAL_CAPSULE | Freq: Three times a day (TID) | ORAL | Status: DC
  Administered 2014-07-29: 2001 mg via ORAL
  Filled 2014-07-28 (×5): qty 3

## 2014-07-28 MED ORDER — IOHEXOL 300 MG/ML  SOLN
100.0000 mL | Freq: Once | INTRAMUSCULAR | Status: AC | PRN
  Administered 2014-07-28: 100 mL via INTRAVENOUS

## 2014-07-28 SURGICAL SUPPLY — 21 items
BAG URO CATCHER STRL LF (DRAPE) ×3 IMPLANT
BASKET STONE NCOMPASS (UROLOGICAL SUPPLIES) ×2 IMPLANT
CATH URET 5FR 28IN OPEN ENDED (CATHETERS) ×2 IMPLANT
CLOTH BEACON ORANGE TIMEOUT ST (SAFETY) ×3 IMPLANT
FIBER LASER FLEXIVA 1000 (UROLOGICAL SUPPLIES) IMPLANT
FIBER LASER FLEXIVA 200 (UROLOGICAL SUPPLIES) IMPLANT
FIBER LASER FLEXIVA 365 (UROLOGICAL SUPPLIES) IMPLANT
FIBER LASER FLEXIVA 550 (UROLOGICAL SUPPLIES) IMPLANT
FIBER LASER TRAC TIP (UROLOGICAL SUPPLIES) IMPLANT
GLOVE SURG SS PI 8.0 STRL IVOR (GLOVE) ×2 IMPLANT
GOWN STRL REUS W/TWL XL LVL3 (GOWN DISPOSABLE) ×3 IMPLANT
GUIDEWIRE STR DUAL SENSOR (WIRE) ×2 IMPLANT
KIT BALLN UROMAX 15FX4 (MISCELLANEOUS) IMPLANT
KIT BALLN UROMAX 26 75X4 (MISCELLANEOUS) ×2
MANIFOLD NEPTUNE II (INSTRUMENTS) ×3 IMPLANT
PACK CYSTO (CUSTOM PROCEDURE TRAY) ×3 IMPLANT
SHEATH ACCESS URETERAL 38CM (SHEATH) ×3 IMPLANT
SHIELD EYE BINOCULAR (MISCELLANEOUS) IMPLANT
STENT CONTOUR 6FRX26X.038 (STENTS) ×2 IMPLANT
TUBING CONNECTING 10 (TUBING) ×2 IMPLANT
TUBING CONNECTING 10' (TUBING) ×1

## 2014-07-28 NOTE — ED Notes (Signed)
Bladder scan read highest at 368ml

## 2014-07-28 NOTE — ED Notes (Signed)
MD aware that pt vomited after drinking contrast. CT called for scan without contrast

## 2014-07-28 NOTE — ED Provider Notes (Signed)
CSN: [id number]     Arrival date & time 07/28/14  [id number] History   First MD Initiated Contact with Patient 07/28/14 [phone]     Chief Complaint  Patient presents with  . Abdominal Pain    Patient is a 64 y.o. male presenting with abdominal pain. The history is provided by the patient and the spouse.  Abdominal Pain Pain location:  Suprapubic and LLQ Pain quality: aching   Pain radiation: back. Pain severity:  Moderate Onset quality:  Gradual Duration:  2 hours Timing:  Constant Progression:  Worsening Chronicity:  New Relieved by:  Nothing Worsened by:  Movement and palpation Associated symptoms: nausea and vomiting   Associated symptoms: no chest pain, no cough, no diarrhea, no fever, no hematemesis and no shortness of breath   Patient woke up with nausea/vomiting and lower abdominal pain He reports pain radiates to his back He has never had this before No cp/sob No fever No HA No focal weakness He is on dialysis, due later today No travel No sick contacts He reports he produces urine  Past Medical History  Diagnosis Date  . CAD, NATIVE VESSEL 01/08/2008    a. Myoview 08/30/11 at [facility]: No scar, + mild apical, apical lateral, mid anterolateral ischemia, EF 49% => s/p CABG (L-LAD, S-OM)  . HYPERLIPIDEMIA-MIXED 01/08/2008  . OVERWEIGHT/OBESITY 01/08/2008  . Anemia   . Blood transfusion ~ 2003  . Bruises easily   . Blind     both eyes removed   . Family history of breast cancer     mother  . Cellulitis late 1980's    "hospitalized; wrapped both legs; several times; no OR for this"  . Peripheral vascular disease   . Pneumonia 2000;s  . H/O prostate cancer     seeds implant 03/2012  . Irregular heartbeat   . Colon polyps   . Dialysis patient     M-W-F [address]  . HYPERTENSION 01/27/2009    BP low  . Hypertension due to AV shunt     left forearm  . Hypotension   . DM type 2 (diabetes mellitus, type 2)     no medications  . ESRD (end stage renal disease) on dialysis      09/27/11 [facility]; Mon, Wed, Fri  . Renal cell carcinoma 2001 and 2003    "both kidneys"   Past Surgical History  Procedure Laterality Date  . Venectomy  1980's    "knees down; both legs; 2 separate times  . Foot amputation  ~ 2002    right;  partial; "infection"  . Cholecystectomy  1992  . Colonoscopy  06/14/2011    Procedure: COLONOSCOPY;  Surgeon: [name], MD;  Location: [facility];  Service: Endoscopy;  Laterality: N/A;  . Hot hemostasis  06/14/2011    Procedure: HOT HEMOSTASIS (ARGON PLASMA COAGULATION/BICAP);  Surgeon: [name], MD;  Location: [facility];  Service: Endoscopy;  Laterality: N/A;  . Av fistula placement  02/2010    LFA  . Varicose vein surgery  mid 1980's    BLE  . Radiofrequency ablation kidney  2010-2012    "twice; one on each side; for cancer"  . Enucleation  2003; 2006    bilateral; "diabetes; pain"  . Cardiac catheterization  ~ 2001  . Coronary artery bypass graft  09/29/2011    Procedure: CORONARY ARTERY BYPASS GRAFTING (CABG);  Surgeon: [name], MD;  Location: [facility];  Service: Open Heart Surgery;  Laterality: N/A;  Coronary Artery  Bypass Graft times two utilizing the left internal mammary artery and the right greater saphenous vein harvested endoscopically.  . Colonoscopy N/A 07/24/2012    Procedure: COLONOSCOPY;  Surgeon: [name], MD;  Location: [facility];  Service: Endoscopy;  Laterality: N/A;  . Hip pinning,cannulated Left 12/31/2013    Procedure: CANNULATED HIP PINNING;  Surgeon: Renette Butters, MD;  Location: [place];  Service: Orthopedics;  Laterality: Left;  Carm, FX Table, [employer]  . Left heart catheterization with coronary angiogram N/A 09/27/2011    Procedure: LEFT HEART CATHETERIZATION WITH CORONARY ANGIOGRAM;  Surgeon: [name], MD;  Location: [facility] CATH LAB;  Service: Cardiovascular;  Laterality: N/A;  . Colonoscopy with propofol N/A 05/13/2014    Procedure: COLONOSCOPY WITH PROPOFOL;  Surgeon: [name], MD;  Location: [facility];  Service: Endoscopy;  Laterality: N/A;  . Enucleation Bilateral   . Colonoscopy     Family History  Problem Relation Age of Onset  . Cancer    . Coronary artery disease    . Kidney disease    . Breast cancer    . Ovarian cancer    . Lung cancer    . Diabetes    . Cancer Mother 24    breast, spine and ovarian  . Heart disease Mother   . Hyperlipidemia Mother   . Hypertension Mother   . Varicose Veins Mother   . Emphysema Father   . Cancer Father 8    kidney, prostate  . Heart disease Father   . Hyperlipidemia Father   . Hypertension Father   . Cancer Sister     ovarian  . Cancer Brother     lung  . Heart disease Brother   . Hyperlipidemia Brother   . Hypertension Brother   . Heart attack Brother   . Peripheral vascular disease Brother   . Malignant hyperthermia Neg Hx    History  Substance Use Topics  . Smoking status: Never Smoker   . Smokeless tobacco: Never Used  . Alcohol Use: No    Review of Systems  Constitutional: Negative for fever.  Respiratory: Negative for cough and shortness of breath.   Cardiovascular: Negative for chest pain.  Gastrointestinal: Positive for nausea, vomiting and abdominal pain. Negative for diarrhea and hematemesis.  Neurological: Negative for headaches.  All other systems reviewed and are negative.     Allergies  Codeine and Tape  Home Medications   Prior to Admission medications   Medication Sig Start Date End Date Taking? Authorizing Provider  aspirin 81 MG tablet Take 81 mg by mouth daily.   Yes Historical Provider, MD  atorvastatin (LIPITOR) 10 MG tablet TAKE 1 TABLET BY MOUTH EVERY DAY 05/13/14  Yes Thayer Headings, MD  b complex-vitamin c-folic acid (NEPHRO-VITE) 0.8 MG TABS Take 0.8 mg by mouth at bedtime.   Yes Historical Provider, MD  calcium acetate (PHOSLO) 667 MG capsule Take 2,001 mg by mouth 3 (three) times daily with meals.    Yes Historical Provider, MD  darbepoetin  (ARANESP) 25 MCG/0.42ML SOLN injection Inject 0.42 mLs (25 mcg total) into the vein every Wednesday with hemodialysis. 01/03/14  Yes [name], MD  midodrine (PROAMATINE) 10 MG tablet Take 10 mg by mouth 2 (two) times daily.   Yes Historical Provider, MD  sevelamer carbonate (RENVELA) 800 MG tablet Take 1,600 mg by mouth 3 (three) times daily with meals.  06/26/14  Yes Historical Provider, MD  acetaminophen (TYLENOL) 325 MG tablet Take 2 tablets (  650 mg total) by mouth every 6 (six) hours as needed for mild pain (or Fever >/= 101). Patient not taking: Reported on 04/30/2014 01/03/14   [name], MD  alum & mag hydroxide-simeth (MAALOX/MYLANTA) 200-200-20 MG/5ML suspension Take 30 mLs by mouth every 4 (four) hours as needed for indigestion or heartburn. Patient not taking: Reported on 04/30/2014 01/03/14   [name], MD  aspirin EC 325 MG EC tablet Take 1 tablet (325 mg total) by mouth daily with breakfast. Patient not taking: Reported on 04/30/2014 01/03/14   [name], MD  aspirin EC 325 MG tablet Take 1 tablet (325 mg total) by mouth daily. Patient not taking: Reported on 04/30/2014 12/31/13   Renette Butters, MD  docusate sodium (COLACE) 100 MG capsule Take 1 capsule (100 mg total) by mouth 2 (two) times daily. Continue this while taking narcotics to help with bowel movements Patient not taking: Reported on 04/30/2014 12/31/13   Renette Butters, MD  HYDROcodone-acetaminophen (NORCO) 5-325 MG per tablet Take 1-2 tablets by mouth every 4 (four) hours as needed for moderate pain. Patient not taking: Reported on 04/30/2014 12/31/13   Renette Butters, MD  insulin aspart (NOVOLOG) 100 UNIT/ML injection Inject 0-9 Units into the skin 3 (three) times daily with meals. Patient not taking: Reported on 04/30/2014 01/03/14   [name], MD  ondansetron (ZOFRAN) 4 MG tablet Take 1 tablet (4 mg total) by mouth every 8 (eight) hours as needed for nausea. Patient not taking: Reported on 07/28/2014 12/31/13    Renette Butters, MD  oxyCODONE-acetaminophen (PERCOCET/ROXICET) 5-325 MG per tablet Take 1-2 tablets by mouth every 4 (four) hours as needed for severe pain. Patient not taking: Reported on 04/30/2014 01/03/14   [name], MD  polyethylene glycol [facility] / Floria Raveling) packet Take 17 g by mouth daily as needed for mild constipation. Patient not taking: Reported on 04/30/2014 01/03/14   [name], MD  zolpidem (AMBIEN) 5 MG tablet Take 1 tablet (5 mg total) by mouth at bedtime as needed and may repeat dose one time if needed for sleep. Patient not taking: Reported on 04/30/2014 01/03/14   [name], MD   BP 180/74 mmHg  Pulse 67  Temp(Src) 97.7 F (36.5 C) (Oral)  Resp 19  Ht 6\' 4"  (1.93 m)  Wt 205 lb (92.987 kg)  BMI 24.96 kg/m2  SpO2 95% Physical Exam CONSTITUTIONAL: Well developed/well nourished HEAD: Normocephalic/atraumatic EYES: pt is blind with prosthetic eyes ENMT: Mucous membranes moist NECK: supple no meningeal signs SPINE/BACK:entire spine nontender CV: S1/S2 noted, no murmurs/rubs/gallops noted LUNGS: Lungs are clear to auscultation bilaterally, no apparent distress ABDOMEN: soft, moderate suprapubic/LLQ tenderness, no rebound or guarding, bowel sounds noted throughout abdomen GU:no cva tenderness, no hernia no scrotal tenderness (wife at bedside per patient request) NEURO: Pt is awake/alert/appropriate, moves all extremitiesx4.  No facial droop.   EXTREMITIES: pulses normal/equal, full ROM, dialysis access to left UE, thrill noted SKIN: warm, color normal PSYCH: no abnormalities of mood noted, alert and oriented to situation  ED Course  Procedures  8:42 AM Pt with continued low abdominal pain IV analgesics/fluids ordered CT scan in process 9:41 AM CT findings noted D/w patient/family Awaiting urine sample then will need admission 11:11 AM D/w medicine dr Doyle Askew will admit Pt stable and feels somewhat improved   Medications  sodium chloride 0.9 % bolus 500  mL (not administered)  ondansetron (ZOFRAN) injection 4 mg (4 mg Intravenous Given 07/28/14 0647)  HYDROmorphone (DILAUDID) injection 1 mg (1  mg Intravenous Given 07/28/14 0700)  iohexol (OMNIPAQUE) 300 MG/ML solution 25 mL (25 mLs Oral Contrast Given 07/28/14 0803)  HYDROmorphone (DILAUDID) injection 1 mg (1 mg Intravenous Given 07/28/14 0813)  ondansetron (ZOFRAN) injection 4 mg (4 mg Intravenous Given 07/28/14 0812)  iohexol (OMNIPAQUE) 300 MG/ML solution 100 mL (100 mLs Intravenous Contrast Given 07/28/14 0841)    Labs Review Labs Reviewed  CBC WITH DIFFERENTIAL/PLATELET - Abnormal; Notable for the following:    RBC 3.73 (*)    Hemoglobin 12.5 (*)    HCT 36.6 (*)    Platelets 97 (*)    Neutrophils Relative % 83 (*)    Lymphocytes Relative 11 (*)    Lymphs Abs 0.6 (*)    All other components within normal limits  COMPREHENSIVE METABOLIC PANEL - Abnormal; Notable for the following:    Potassium 5.5 (*)    Chloride 94 (*)    Glucose, Bld 208 (*)    BUN 144 (*)    Creatinine, Ser 9.83 (*)    GFR calc non Af Amer 5 (*)    GFR calc Af Amer 6 (*)    Anion gap 20 (*)    All other components within normal limits  LIPASE, BLOOD - Abnormal; Notable for the following:    Lipase 76 (*)    All other components within normal limits  URINALYSIS, ROUTINE W REFLEX MICROSCOPIC - Abnormal; Notable for the following:    Glucose, UA 100 (*)    Protein, ur 30 (*)    All other components within normal limits  I-STAT CHEM 8, ED - Abnormal; Notable for the following:    Potassium 5.4 (*)    BUN 135 (*)    Creatinine, Ser 9.10 (*)    Glucose, Bld 210 (*)    Hemoglobin 12.2 (*)    HCT 36.0 (*)    All other components within normal limits  URINE CULTURE  PROTIME-INR  URINE MICROSCOPIC-ADD ON  I-STAT CG4 LACTIC ACID, ED  I-STAT CHEM 8, ED  I-STAT CG4 LACTIC ACID, ED    Imaging Review Ct Abdomen Pelvis W Contrast  07/28/2014   CLINICAL DATA:  Nausea and vomiting beginning today. Renal failure  patient.  EXAM: CT ABDOMEN AND PELVIS WITH CONTRAST  TECHNIQUE: Multidetector CT imaging of the abdomen and pelvis was performed using the standard protocol following bolus administration of intravenous contrast.  CONTRAST:  100 mL OMNIPAQUE IOHEXOL 300 MG/ML  SOLN  COMPARISON:  MRI abdomen 01/03/2012. CT abdomen and pelvis 02/03/2003.  FINDINGS: Dependent atelectasis is seen in the lung bases, more notable on the right. No pleural or pericardial effusion. Coronary artery disease is noted. The patient is status post CABG.  The gallbladder has been removed. Mild intrahepatic biliary ductal dilatation is likely related to patient's age and gallbladder removal. The spleen adrenal glands are unremarkable. The pancreas is atrophic but otherwise normal in appearance.  There is mild left hydronephrosis and dilatation of the left ureter. Stranding is seen about the mid and distal ureter. No stone or other focally obstructing lesion is visualized on this examination. Cortical defect and calcification in the midpole of the left kidney posteriorly is compatible with prior radiofrequency ablation. An exophytic lesion off the upper pole of the right kidney measures 3.6 x 3.6 cm compared to 3.7 x 3.8 cm on the prior MRI and is consistent with prior radiofrequency ablation. A new nodule is seen off the posterior and lateral margin of the RFA site measuring 0.9 cm in diameter and  demonstrating Hounsfield units of 58.3 on early phase imaging and 36.1 on delayed imaging.  A large stool ball seen the rectosigmoid colon the colon is otherwise unremarkable. There is no small bowel obstruction. No lymphadenopathy or fluid is identified.  No lytic or sclerotic bony lesion is seen. The patient is status post fixation of remote left hip fracture. Degenerative disease present about both hips.  IMPRESSION: New mild left hydronephrosis with stranding about the mid to distal left ureter. Cause for this finding is not identified. It could be  secondary to recent stone passage, stricture of the ureter or urothelial neoplasm.  New nodule at the RFA site on the right could be due to tumor recurrence. Neurologic consultation is recommended for evaluation of this finding in the patient's hydronephrosis described above.   Electronically Signed   By: Inge Rise M.D.   On: 07/28/2014 09:16     EKG Interpretation   Date/Time:  Monday July 28 2014 06:47:07 EDT Ventricular Rate:  67 PR Interval:  251 QRS Duration: 95 QT Interval:  438 QTC Calculation: 462 R Axis:   20 Text Interpretation:  Sinus or ectopic atrial rhythm Prolonged PR interval  Baseline wander in lead(s) V2 V4 artifact noted No significant change  since last tracing Confirmed by Christy Gentles  MD, Elenore Rota ([id number]) on 07/28/2014  7:27:09 AM      MDM   Final diagnoses:  Hydronephrosis, unspecified hydronephrosis type  Intractable pain    Nursing notes including past medical history and social history reviewed and considered in documentation Labs/vital reviewed myself and considered during evaluation Previous records reviewed and considered     [name], MD 07/28/14 1112

## 2014-07-28 NOTE — Anesthesia Procedure Notes (Signed)
Procedure Name: LMA Insertion Date/Time: 07/28/2014 6:43 PM Performed by: Johnathan Hausen A Pre-anesthesia Checklist: Patient identified, Emergency Drugs available, Suction available, Patient being monitored and Timeout performed Patient Re-evaluated:Patient Re-evaluated prior to inductionOxygen Delivery Method: Circle system utilized Preoxygenation: Pre-oxygenation with 100% oxygen Intubation Type: IV induction Ventilation: Mask ventilation without difficulty LMA: LMA with gastric port inserted LMA Size: 5.0 Tube type: Oral Number of attempts: 1 Dental Injury: Teeth and Oropharynx as per pre-operative assessment

## 2014-07-28 NOTE — Progress Notes (Addendum)
Patient ID: [name], male   DOB: September 24, 1950, 64 y.o.   MRN: [id number]  [facility] History and Physical  [name] [id number] DOB: 05/03/1950 DOA: 07/28/2014  Referring physician: [name], MD PCP: [name], MD   Chief Complaint: nausea and vomiting   HPI:  Patient is 64 year old male with multiple and complex medical conditions including end-stage renal disease on HD MWF, HTN, HLD, diabetes type 2 with complications of ESRD and peripheral vascular disease and s/p right foot amputation, blindness and s/p bilateral enucleation, history of renal and prostate cancer (s/p seeds implant), presented to [facility] ED with main concern of several hours duration of progressively worsening LLQ abd pain, sharp, intermittent and occasionally but not consistently radiating to the suprapubic area, worse with eating and with no specific alleviating factors, associated with nausea and non bloody vomiting, no similar events in the past. Pt denies fevers, chills, no chest pain or shortness of breath. Pt denies noticing blood in urine or stool, no focal neurological symptoms.   In ED, patient noted to be hemodynamically stable, vital signs stable, blood work notable for K5.5, Cr 9.83. Abdominal CT notable for new mild left hydronephrosis with stranding in the mid to distal left ureter of unclear etiology. TR[facility] asked to admit for further evaluation and management. Nephrology team consulted for continuation of hemodialysis as patient has not had his scheduled dialysis session today. Urologist consulted as well.  Assessment and Plan: Principal Problem:   Acute hydronephrosis, mid to distal left ureter stranding  - spoke with Dr. [name], appreciate his assistance, will follow up on recommendations  - admit to telemetry unit - provide supportive care with IVF, analgesia as needed  - added Flomax per Dr. [name] recommendations   Addendum: Dr. [name] to take to OR at [facility] today and pt to return to  [facility] after procedure ureteroscopy and stenting    Nausea and vomiting - possibly related to the above - will try to advance diet to clear liquid for now as pt reports he is hungry and would like to eat - provide antiemetics as needed   Active Problems:   Peripheral vascular disease, s/p right foot amputation - continue aspirin 81 mg PO QD per home medical regimen     ESRD (end stage renal disease) on dialysis - has not had scheduled HD session today, nephrologist team notified  - appreciate assistance - since pt needs ureteroscopy and stenting this evening, plan to do HD in AM  - pt is not volume overloaded on exam and HD can be done in AM    Anemia of chronic disease, ESRD - Hg stable and at baseline - repeat CBC in AM    Hyperkalemia - to be addressed with HD session    Thrombocytopenia - chronic in etiology, no signs of active bleeding - repeat CBC in AM    DM type II with complications of blindness, ESRD, PVD, amputation - check A1C, I do not see recent A1C - place on SSI for now and readjust the regimen as indicated     CAD, s/p CABG 2013 - continue aspirin, pt takes 81 mg PO QD at home     DVT prophylaxis - Heparin SQ   Radiological Exams on Admission: Ct Abdomen Pelvis W Contrast  07/28/2014   New mild left hydronephrosis with stranding about the mid to distal left ureter. Cause for this finding is not identified. It could be secondary to recent stone passage, stricture of the ureter or  urothelial neoplasm.  New nodule at the RFA site on the right could be due to tumor recurrence. Neurologic consultation is recommended for evaluation of this finding in the patient's hydronephrosis described above.     Code Status: Full Family Communication: Pt and wife at bedside Disposition Plan: Admit for further evaluation    [name] [facility] [id number]  Review of Systems:  Constitutional: Negative for fever, chills. Negative for diaphoresis.  HENT: Negative for hearing loss,  ear pain, nosebleeds, congestion, sore throat, neck pain, tinnitus and ear discharge.   Eyes: Negative for blurred vision, double vision, photophobia, pain, discharge and redness.  Respiratory: Negative for cough, hemoptysis, sputum production, shortness of breath, wheezing and stridor.   Cardiovascular: Negative for chest pain, palpitations, orthopnea, claudication.  Gastrointestinal:  Negative for heartburn, constipation, blood in stool and melena.  Genitourinary: Negative for hematuria and flank pain.  Musculoskeletal: Negative for myalgias, back pain, joint pain and falls.  Skin: Negative for itching and rash.  Neurological: Negative for dizziness and weakness.  Endo/Heme/Allergies: Negative for environmental allergies and polydipsia. Does not bruise/bleed easily.  Psychiatric/Behavioral: Negative for suicidal ideas. The patient is not nervous/anxious.      Past Medical History  Diagnosis Date  . CAD, NATIVE VESSEL 01/08/2008    a. Myoview 08/30/11 at [facility]: No scar, + mild apical, apical lateral, mid anterolateral ischemia, EF 49% => s/p CABG (L-LAD, S-OM)  . HYPERLIPIDEMIA-MIXED 01/08/2008  . OVERWEIGHT/OBESITY 01/08/2008  . Anemia   . Blood transfusion ~ 2003  . Bruises easily   . Blind     both eyes removed   . Family history of breast cancer     mother  . Cellulitis late 1980's    "hospitalized; wrapped both legs; several times; no OR for this"  . Peripheral vascular disease   . Pneumonia 2000;s  . H/O prostate cancer     seeds implant 03/2012  . Irregular heartbeat   . Colon polyps   . Dialysis patient     M-W-F [address]  . HYPERTENSION 01/27/2009    BP low  . Hypertension due to AV shunt     left forearm  . Hypotension   . DM type 2 (diabetes mellitus, type 2)     no medications  . ESRD (end stage renal disease) on dialysis     09/27/11 [facility]; Mon, Wed, Fri  . Renal cell carcinoma 2001 and 2003    "both kidneys"  . CHF (congestive heart failure)   .  Renal insufficiency     Past Surgical History  Procedure Laterality Date  . Venectomy  1980's    "knees down; both legs; 2 separate times  . Foot amputation  ~ 2002    right;  partial; "infection"  . Cholecystectomy  1992  . Colonoscopy  06/14/2011    Procedure: COLONOSCOPY;  Surgeon: [name], MD;  Location: [facility]OSCOPY;  Service: Endoscopy;  Laterality: N/A;  . Hot hemostasis  06/14/2011    Procedure: HOT HEMOSTASIS (ARGON PLASMA COAGULATION/BICAP);  Surgeon: [name], MD;  Location: [facility];  Service: Endoscopy;  Laterality: N/A;  . Av fistula placement  02/2010    LFA  . Varicose vein surgery  mid 1980's    BLE  . Radiofrequency ablation kidney  2010-2012    "twice; one on each side; for cancer"  . Enucleation  2003; 2006    bilateral; "diabetes; pain"  . Cardiac catheterization  ~ 2001  . Coronary artery bypass graft  09/29/2011    Procedure: CORONARY ARTERY BYPASS GRAFTING (CABG);  Surgeon: [name], MD;  Location: [facility];  Service: Open Heart Surgery;  Laterality: N/A;  Coronary Artery Bypass Graft times two utilizing the left internal mammary artery and the right greater saphenous vein harvested endoscopically.  . Colonoscopy N/A 07/24/2012    Procedure: COLONOSCOPY;  Surgeon: [name], MD;  Location: [facility];  Service: Endoscopy;  Laterality: N/A;  . Hip pinning,cannulated Left 12/31/2013    Procedure: CANNULATED HIP PINNING;  Surgeon: Renette Butters, MD;  Location: [facility];  Service: Orthopedics;  Laterality: Left;  Carm, FX Table, [employer]  . Left heart catheterization with coronary angiogram N/A 09/27/2011    Procedure: LEFT HEART CATHETERIZATION WITH CORONARY ANGIOGRAM;  Surgeon: [name], MD;  Location: [facility] CATH LAB;  Service: Cardiovascular;  Laterality: N/A;  . Colonoscopy with propofol N/A 05/13/2014    Procedure: COLONOSCOPY WITH PROPOFOL;  Surgeon: [name], MD;  Location: [facility];  Service: Endoscopy;  Laterality: N/A;   . Enucleation Bilateral   . Colonoscopy      Social History:  reports that he has never smoked. He has never used smokeless tobacco. He reports that he does not drink alcohol or use illicit drugs.  Allergies  Allergen Reactions  . Codeine Other (See Comments)    Makes patient incoherent. STATES MAKES HIM COMATOSE  . Tape Other (See Comments)    Plastic tape tears skin off, please use paper tape instead.    Family History  Problem Relation Age of Onset  . Cancer    . Coronary artery disease    . Kidney disease    . Breast cancer    . Ovarian cancer    . Lung cancer    . Diabetes    . Cancer Mother 36    breast, spine and ovarian  . Heart disease Mother   . Hyperlipidemia Mother   . Hypertension Mother   . Varicose Veins Mother   . Emphysema Father   . Cancer Father 57    kidney, prostate  . Heart disease Father   . Hyperlipidemia Father   . Hypertension Father   . Cancer Sister     ovarian  . Cancer Brother     lung  . Heart disease Brother   . Hyperlipidemia Brother   . Hypertension Brother   . Heart attack Brother   . Peripheral vascular disease Brother   . Malignant hyperthermia Neg Hx     Medication Sig  aspirin 81 MG tablet Take 81 mg by mouth daily.  atorvastatin (10 MG tablet TAKE 1 TABLET BY MOUTH EVERY DAY  b complex-vitamin c-folic acid  Take 0.8 mg by mouth at bedtime.  calcium acetate 667 MG capsule Take 2,001 mg by mouth 3 (three) times daily   midodrine 10 MG tablet Take 10 mg by mouth 2 (two) times daily.  sevelamer carbonate  800 MG Take 1,600 mg  3 (three) times daily with meals.   insulin 100 UNIT/ML injection Inject 0-9 Units 3 times daily. Pt not taking:    Physical Exam: Filed Vitals:   07/28/14 0915 07/28/14 0930 07/28/14 0945 07/28/14 1000  BP: 179/85 176/85 183/94 170/83  Pulse: 68 70 71 71  Temp:      TempSrc:      Resp: 15 20 19 14   Height:      Weight:      SpO2: 97% 97% 98% 96%    Physical Exam  Constitutional: Appears  well-developed and well-nourished. No distress.  HENT: Normocephalic. External right and left ear normal. Oropharynx is clear and moist.  Eyes: S/P enucleation, blondness Neck: Normal ROM. Neck supple. No JVD. No tracheal deviation. No thyromegaly.  CVS: RRR, S1/S2 +, no gallops, no carotid bruit.  Pulmonary: Effort and breath sounds normal, no stridor, rhonchi, wheezes, rales.  Abdominal: Soft. BS +,  no distension, tenderness in lower abd quadrants, L > R side, no rebound or guarding.  Musculoskeletal: Normal range of motion. S/P right foot amputation, left UE fistula with good thrill  Lymphadenopathy: No lymphadenopathy noted, cervical, inguinal. Neuro: Alert. Normal reflexes, muscle tone coordination. No cranial nerve deficit. Skin: Skin is warm and dry. No rash noted. Not diaphoretic. No erythema. No pallor.  Psychiatric: Normal mood and affect.   Labs on Admission:  Basic Metabolic Panel:  Recent Labs Lab 07/28/14 0645 07/28/14 0654  NA 138 137  K 5.5* 5.4*  CL 94* 99  CO2 24  --   GLUCOSE 208* 210*  BUN 144* 135*  CREATININE 9.83* 9.10*  CALCIUM 10.2  --    Liver Function Tests:  Recent Labs Lab 07/28/14 0645  AST 12  ALT 13  ALKPHOS 77  BILITOT 0.7  PROT 7.3  ALBUMIN 4.0    Recent Labs Lab 07/28/14 0645  LIPASE 76*   CBC:  Recent Labs Lab 07/28/14 0645 07/28/14 0654  WBC 5.7  --   NEUTROABS 4.7  --   HGB 12.5* 12.2*  HCT 36.6* 36.0*  MCV 98.1  --   PLT 97*  --     EKG: Pending  Time spent: 75 minutes    If 7PM-7AM, please contact night-coverage [email or web address] Password [facility] 07/28/2014, 11:20 AM

## 2014-07-28 NOTE — H&P (Signed)
[facility] History and Physical  Abdihakim Thorsen Brandt [id number] DOB: Feb 16, 1951 DOA: 07/28/2014   Referring physician: [name], MD PCP: [name], MD   Chief Complaint: nausea and vomiting   HPI:  Patient is 64 year old male with multiple and complex medical conditions including end-stage renal disease on HD MWF, HTN, HLD, diabetes type 2 with complications of ESRD and peripheral vascular disease and s/p right foot amputation, blindness and s/p bilateral enucleation, history of renal and prostate cancer (s/p seeds implant), presented to [facility] ED with main concern of several hours duration of progressively worsening LLQ abd pain, sharp, intermittent and occasionally but not consistently radiating to the suprapubic area, worse with eating and with no specific alleviating factors, associated with nausea and non bloody vomiting, no similar events in the past. Pt denies fevers, chills, no chest pain or shortness of breath. Pt denies noticing blood in urine or stool, no focal neurological symptoms.   In ED, patient noted to be hemodynamically stable, vital signs stable, blood work notable for K5.5, Cr 9.83. Abdominal CT notable for new mild left hydronephrosis with stranding in the mid to distal left ureter of unclear etiology. [facility] asked to admit for further evaluation and management. Nephrology team consulted for continuation of hemodialysis as patient has not had his scheduled dialysis session today. Urologist consulted as well.  Assessment and Plan: Principal Problem:  Acute hydronephrosis, mid to distal left ureter stranding  - spoke with Dr. [name], appreciate his assistance, will follow up on recommendations  - admit to telemetry unit - provide supportive care with IVF, analgesia as needed  - added Flomax per Dr. [name] recommendations   Addendum: Dr. [name] to take to OR at [facility] today and pt to return to [facility] after procedure ureteroscopy and stenting   Nausea and  vomiting - possibly related to the above - will try to advance diet to clear liquid for now as pt reports he is hungry and would like to eat - provide antiemetics as needed   Active Problems:  Peripheral vascular disease, s/p right foot amputation - continue aspirin 81 mg PO QD per home medical regimen    ESRD (end stage renal disease) on dialysis - has not had scheduled HD session today, nephrologist team notified  - appreciate assistance - since pt needs ureteroscopy and stenting this evening, plan to do HD in AM  - pt is not volume overloaded on exam and HD can be done in AM   Anemia of chronic disease, ESRD - Hg stable and at baseline - repeat CBC in AM   Hyperkalemia - to be addressed with HD session   Thrombocytopenia - chronic in etiology, no signs of active bleeding - repeat CBC in AM   DM type II with complications of blindness, ESRD, PVD, amputation - check A1C, I do not see recent A1C - place on SSI for now and readjust the regimen as indicated    CAD, s/p CABG 2013 - continue aspirin, pt takes 81 mg PO QD at home    DVT prophylaxis - Heparin SQ   Radiological Exams on Admission: Ct Abdomen Pelvis W Contrast 07/28/2014 New mild left hydronephrosis with stranding about the mid to distal left ureter. Cause for this finding is not identified. It could be secondary to recent stone passage, stricture of the ureter or urothelial neoplasm. New nodule at the RFA site on the right could be due to tumor recurrence. Neurologic consultation is recommended for evaluation of this finding in the patient's  hydronephrosis described above.   Code Status: Full Family Communication: Pt and wife at bedside Disposition Plan: Admit for further evaluation   [name] [facility] [id number]  Review of Systems:  Constitutional: Negative for fever, chills. Negative for diaphoresis.  HENT: Negative for hearing loss, ear pain, nosebleeds, congestion, sore throat, neck pain,  tinnitus and ear discharge.  Eyes: Negative for blurred vision, double vision, photophobia, pain, discharge and redness.  Respiratory: Negative for cough, hemoptysis, sputum production, shortness of breath, wheezing and stridor.  Cardiovascular: Negative for chest pain, palpitations, orthopnea, claudication.  Gastrointestinal: Negative for heartburn, constipation, blood in stool and melena.  Genitourinary: Negative for hematuria and flank pain.  Musculoskeletal: Negative for myalgias, back pain, joint pain and falls.  Skin: Negative for itching and rash.  Neurological: Negative for dizziness and weakness.  Endo/Heme/Allergies: Negative for environmental allergies and polydipsia. Does not bruise/bleed easily.  Psychiatric/Behavioral: Negative for suicidal ideas. The patient is not nervous/anxious  Past Medical History  Diagnosis Date  . CAD, NATIVE VESSEL 01/08/2008    a. Myoview 08/30/11 at [facility]: No scar, + mild apical, apical lateral, mid anterolateral ischemia, EF 49% => s/p CABG (L-LAD, S-OM)  . HYPERLIPIDEMIA-MIXED 01/08/2008  . OVERWEIGHT/OBESITY 01/08/2008  . Anemia   . Blood transfusion     "related to anemia; foot surgery followed by MRSA & many times since"  . Bruises easily   . Blind     both eyes removed   . Family history of breast cancer     mother  . Cellulitis late 1980's    "hospitalized; wrapped both legs; several times; no OR for this"  . Peripheral vascular disease   . Irregular heartbeat   . Colon polyps   . HYPERTENSION 01/27/2009    BP low  . Hypertension due to AV shunt     left forearm  . Hypotension   . CHF (congestive heart failure)   . Prostate cancer   . Pneumonia 2000;s X 1  . DM type 2 (diabetes mellitus, type 2)     no medications (07/28/2014)  . Arthritis     "back, neck" (07/28/2014)  . ESRD (end stage renal disease) on dialysis     [facility]; Mon, Wed, Fri (07/28/2014)  . Renal insufficiency   . Renal cell carcinoma 2001 and 2003     "both kidneys"    Past Surgical History  Procedure Laterality Date  . Foot amputation Right ~ 2002    right;  partial; "infection"  . Colonoscopy  06/14/2011    Procedure: COLONOSCOPY;  Surgeon: [name], MD;  Location: [facility] ENDOSCOPY;  Service: Endoscopy;  Laterality: N/A;  . Hot hemostasis  06/14/2011    Procedure: HOT HEMOSTASIS (ARGON PLASMA COAGULATION/BICAP);  Surgeon: [name], MD;  Location: [facility];  Service: Endoscopy;  Laterality: N/A;  . Av fistula placement Left 02/2010    LFA  . Varicose vein surgery  mid 1980's     BLE; "knees down; both legs; 2 separate times"  . Radiofrequency ablation kidney  2010-2012    "twice; one on each side; for cancer"  . Cardiac catheterization  ~ 2001  . Coronary artery bypass graft  09/29/2011    Procedure: CORONARY ARTERY BYPASS GRAFTING (CABG);  Surgeon: [name], MD;  Location: [place];  Service: Open Heart Surgery;  Laterality: N/A;  Coronary Artery Bypass Graft times two utilizing the left internal mammary artery and the right greater saphenous vein harvested endoscopically.  . Colonoscopy N/A 07/24/2012  Procedure: COLONOSCOPY;  Surgeon: [name], MD;  Location: [facility];  Service: Endoscopy;  Laterality: N/A;  . Hip pinning,cannulated Left 12/31/2013    Procedure: CANNULATED HIP PINNING;  Surgeon: Renette Butters, MD;  Location: [place];  Service: Orthopedics;  Laterality: Left;  Carm, FX Table, [employer]  . Left heart catheterization with coronary angiogram N/A 09/27/2011    Procedure: LEFT HEART CATHETERIZATION WITH CORONARY ANGIOGRAM;  Surgeon: [name], MD;  Location: [facility] CATH LAB;  Service: Cardiovascular;  Laterality: N/A;  . Colonoscopy with propofol N/A 05/13/2014    Procedure: COLONOSCOPY WITH PROPOFOL;  Surgeon: [name], MD;  Location: [facility];  Service: Endoscopy;  Laterality: N/A;  . Colonoscopy    . Enucleation  2003; 2006    bilateral; "diabetes; pain"  . Fracture surgery    .  Cholecystectomy open  1992  . Insertion prostate radiation seed  03/2012    Social History:  reports that he has never smoked. He has never used smokeless tobacco. He reports that he does not drink alcohol or use illicit drugs.  Allergies  Allergen Reactions  . Codeine Other (See Comments)    Makes patient incoherent. STATES MAKES HIM COMATOSE  . Tape Other (See Comments)    Plastic tape tears skin off, please use paper tape instead.    Family History  Problem Relation Age of Onset  . Cancer    . Coronary artery disease    . Kidney disease    . Breast cancer    . Ovarian cancer    . Lung cancer    . Diabetes    . Cancer Mother 68    breast, spine and ovarian  . Heart disease Mother   . Hyperlipidemia Mother   . Hypertension Mother   . Varicose Veins Mother   . Emphysema Father   . Cancer Father 35    kidney, prostate  . Heart disease Father   . Hyperlipidemia Father   . Hypertension Father   . Cancer Sister     ovarian  . Cancer Brother     lung  . Heart disease Brother   . Hyperlipidemia Brother   . Hypertension Brother   . Heart attack Brother   . Peripheral vascular disease Brother   . Malignant hyperthermia Neg Hx     Prior to Admission medications   Medication Sig Start Date End Date Taking? Authorizing Provider  aspirin 81 MG tablet Take 81 mg by mouth daily.   Yes Historical Provider, MD  atorvastatin (LIPITOR) 10 MG tablet TAKE 1 TABLET BY MOUTH EVERY DAY 05/13/14  Yes Thayer Headings, MD  b complex-vitamin c-folic acid (NEPHRO-VITE) 0.8 MG TABS Take 0.8 mg by mouth at bedtime.   Yes Historical Provider, MD  calcium acetate (PHOSLO) 667 MG capsule Take 2,001 mg by mouth 3 (three) times daily with meals.    Yes Historical Provider, MD  darbepoetin (ARANESP) 25 MCG/0.42ML SOLN injection Inject 0.42 mLs (25 mcg total) into the vein every Wednesday with hemodialysis. 01/03/14  Yes [name], MD  midodrine (PROAMATINE) 10 MG tablet Take 10 mg by mouth 2 (two)  times daily.   Yes Historical Provider, MD  sevelamer carbonate (RENVELA) 800 MG tablet Take 1,600 mg by mouth 3 (three) times daily with meals.  06/26/14  Yes Historical Provider, MD  acetaminophen (TYLENOL) 325 MG tablet Take 2 tablets (650 mg total) by mouth every 6 (six) hours as needed for mild pain (or Fever >/= 101).  Patient not taking: Reported on 04/30/2014 01/03/14   [name], MD  alum & mag hydroxide-simeth (MAALOX/MYLANTA) 200-200-20 MG/5ML suspension Take 30 mLs by mouth every 4 (four) hours as needed for indigestion or heartburn. Patient not taking: Reported on 04/30/2014 01/03/14   [name], MD  aspirin EC 325 MG EC tablet Take 1 tablet (325 mg total) by mouth daily with breakfast. Patient not taking: Reported on 04/30/2014 01/03/14   [name], MD  aspirin EC 325 MG tablet Take 1 tablet (325 mg total) by mouth daily. Patient not taking: Reported on 04/30/2014 12/31/13   Renette Butters, MD  docusate sodium (COLACE) 100 MG capsule Take 1 capsule (100 mg total) by mouth 2 (two) times daily. Continue this while taking narcotics to help with bowel movements Patient not taking: Reported on 04/30/2014 12/31/13   Renette Butters, MD  HYDROcodone-acetaminophen (NORCO) 5-325 MG per tablet Take 1-2 tablets by mouth every 4 (four) hours as needed for moderate pain. Patient not taking: Reported on 04/30/2014 12/31/13   Renette Butters, MD  insulin aspart (NOVOLOG) 100 UNIT/ML injection Inject 0-9 Units into the skin 3 (three) times daily with meals. Patient not taking: Reported on 04/30/2014 01/03/14   [name], MD  ondansetron (ZOFRAN) 4 MG tablet Take 1 tablet (4 mg total) by mouth every 8 (eight) hours as needed for nausea. Patient not taking: Reported on 07/28/2014 12/31/13   Renette Butters, MD  oxyCODONE-acetaminophen (PERCOCET/ROXICET) 5-325 MG per tablet Take 1-2 tablets by mouth every 4 (four) hours as needed for severe pain. Patient not taking: Reported on 04/30/2014 01/03/14    [name], MD  polyethylene glycol [facility] / Floria Raveling) packet Take 17 g by mouth daily as needed for mild constipation. Patient not taking: Reported on 04/30/2014 01/03/14   [name], MD  zolpidem (AMBIEN) 5 MG tablet Take 1 tablet (5 mg total) by mouth at bedtime as needed and may repeat dose one time if needed for sleep. Patient not taking: Reported on 04/30/2014 01/03/14   [name], MD    Physical Exam: Filed Vitals:   07/28/14 1300 07/28/14 1330 07/28/14 1400 07/28/14 1452  BP: 151/74 160/77 159/83 147/80  Pulse: 69 69 71 68  Temp:    97.9 F (36.6 C)  TempSrc:    Oral  Resp: 10 11 23 18   Height:      Weight:      SpO2: 100% 100% 99% 100%    Physical Exam  Constitutional: Appears well-developed and well-nourished. No distress.  HENT: Normocephalic. External right and left ear normal. Oropharynx is clear and moist.  Eyes: S/P enucleation, blondness Neck: Normal ROM. Neck supple. No JVD. No tracheal deviation. No thyromegaly.  CVS: RRR, S1/S2 +, no gallops, no carotid bruit.  Pulmonary: Effort and breath sounds normal, no stridor, rhonchi, wheezes, rales.  Abdominal: Soft. BS +, no distension, tenderness in lower abd quadrants, L > R side, no rebound or guarding.  Musculoskeletal: Normal range of motion. S/P right foot amputation, left UE fistula with good thrill  Lymphadenopathy: No lymphadenopathy noted, cervical, inguinal. Neuro: Alert. Normal reflexes, muscle tone coordination. No cranial nerve deficit. Skin: Skin is warm and dry. No rash noted. Not diaphoretic. No erythema. No pallor.  Psychiatric: Normal mood and affect.    Labs on Admission:  Basic Metabolic Panel:  Recent Labs Lab 07/28/14 0645 07/28/14 0654  NA 138 137  K 5.5* 5.4*  CL 94* 99  CO2 24  --   GLUCOSE 208*  210*  BUN 144* 135*  CREATININE 9.83* 9.10*  CALCIUM 10.2  --    Liver Function Tests:  Recent Labs Lab 07/28/14 0645  AST 12  ALT 13  ALKPHOS 77  BILITOT 0.7  PROT  7.3  ALBUMIN 4.0    Recent Labs Lab 07/28/14 0645  LIPASE 76*   CBC:  Recent Labs Lab 07/28/14 0645 07/28/14 0654  WBC 5.7  --   NEUTROABS 4.7  --   HGB 12.5* 12.2*  HCT 36.6* 36.0*  MCV 98.1  --   PLT 97*  --    Cardiac Enzymes: No results for input(s): CKTOTAL, CKMB, CKMBINDEX, TROPONINI in the last 168 hours. BNP: Invalid input(s): POCBNP CBG:  Recent Labs Lab 07/28/14 1403 07/28/14 1652  GLUCAP 155* 106*    EKG: pending    If 7PM-7AM, please contact night-coverage [email or web address] Password [facility] 07/28/2014, 5:37 PM

## 2014-07-28 NOTE — Brief Op Note (Signed)
07/28/2014  7:15 PM  PATIENT:  [name]  64 y.o. male  PRE-OPERATIVE DIAGNOSIS:  left ureteral stone  POST-OPERATIVE DIAGNOSIS:  left ureteral stone/clot, Multiple small bladder stones, left distal ureteral stricture.   PROCEDURE:  Procedure(s): CYSTOSCOPY WITH RETROGRADE PYELOGRAM, URETERAL BALLOON DILITATION, URETEROSCOPY AND LEFT STENT PLACEMENT (Left) REMOVAL OF MULTIPLE SMALL BLADDER STONES.  SURGEON:  Surgeon(s) and Role:    * [name], MD - Primary  PHYSICIAN ASSISTANT:   ASSISTANTS: none   ANESTHESIA:   general  EBL:     BLOOD ADMINISTERED:none  DRAINS: left 6x 26 JJ stent   LOCAL MEDICATIONS USED:  NONE  SPECIMEN:  Source of Specimen:  soft stone material from bladder  DISPOSITION OF SPECIMEN:  PATHOLOGY  COUNTS:  YES  TOURNIQUET:  * No tourniquets in log *  DICTATION: .Other Dictation: Dictation Number [id number]  PLAN OF CARE: Admit to inpatient   PATIENT DISPOSITION:  PACU - hemodynamically stable.   Delay start of Pharmacological VTE agent (>24hrs) due to surgical blood loss or risk of bleeding: not applicable

## 2014-07-28 NOTE — Progress Notes (Signed)
Received report from [facility] in the ED.  Joellen Jersey, RN.

## 2014-07-28 NOTE — Consult Note (Signed)
Subjective: I was asked to see [name] by Dr. Doyle Askew for left hydro and pain.  [name] had Nicholas acute onset this am of severe left flank pain with nausea and vomiting.  [name] has had some malaise over Nicholas past week.   [name] had a CT that showed left hydro with distal obstruction without a stone.   There is also a new 24mm nodule on Nicholas prior RFA site from treatment of a right renal cancer.  [name] has prostate cancer that was treated with seeds in Nicholas past as well.  [name] has ESRD and is on dialysis with oliguria.  [name] voids 6-8 oz daily.  [name] has had no new voiding complaints or hematuria.  [name] was supposed to have an AV graft revision tomorrow.  ROS:  Review of Systems  Constitutional: Positive for malaise/fatigue. Negative for fever and chills.  HENT:       [name] is blind  Respiratory: Negative for shortness of breath.   Cardiovascular: Negative for chest pain.  Gastrointestinal: Positive for nausea and vomiting.  Genitourinary: Positive for flank pain. Negative for dysuria and hematuria.  Musculoskeletal:       Right foot partial amputation  Psychiatric/Behavioral: Negative.   All other systems reviewed and are negative.  Allergies  Allergen Reactions  . Codeine Other (See Comments)    Makes [name] incoherent. STATES MAKES HIM COMATOSE  . Tape Other (See Comments)    Plastic tape tears skin off, please use paper tape instead.    Past Medical History  Diagnosis Date  . CAD, NATIVE VESSEL 01/08/2008    a. Myoview 08/30/11 at [facility]: No scar, + mild apical, apical lateral, mid anterolateral ischemia, EF 49% => s/p CABG (L-LAD, S-OM)  . HYPERLIPIDEMIA-MIXED 01/08/2008  . OVERWEIGHT/OBESITY 01/08/2008  . Anemia   . Blood transfusion ~ 2003  . Bruises easily   . Blind     both eyes removed   . Family history of breast cancer     mother  . Cellulitis late 1980's    "hospitalized; wrapped both legs; several times; no OR for this"  . Peripheral vascular disease   . Pneumonia 2000;s  . H/O prostate cancer      seeds implant 03/2012  . Irregular heartbeat   . Colon polyps   . Dialysis [name]     M[facility] [address]  . HYPERTENSION 01/27/2009    BP low  . Hypertension due to AV shunt     left forearm  . Hypotension   . DM type 2 (diabetes mellitus, type 2)     no medications  . ESRD (end stage renal disease) on dialysis     09/27/11 [facility]; Mon, Wed, Fri  . Renal cell carcinoma 2001 and 2003    "both kidneys"  . CHF (congestive heart failure)   . Renal insufficiency     Past Surgical History  Procedure Laterality Date  . Venectomy  1980's    "knees down; both legs; 2 separate times  . Foot amputation  ~ 2002    right;  partial; "infection"  . Cholecystectomy  1992  . Colonoscopy  06/14/2011    Procedure: COLONOSCOPY;  Surgeon: [name], MD;  Location: [facility];  Service: Endoscopy;  Laterality: N/A;  . Hot hemostasis  06/14/2011    Procedure: HOT HEMOSTASIS (ARGON PLASMA COAGULATION/BICAP);  Surgeon: [name], MD;  Location: [facility];  Service: Endoscopy;  Laterality: N/A;  . Av fistula placement  02/2010  LFA  . Varicose vein surgery  mid 1980's    BLE  . Radiofrequency ablation kidney  2010-2012    "twice; one on each [name]; for cancer"  . Enucleation  2003; 2006    bilateral; "diabetes; pain"  . Cardiac catheterization  ~ 2001  . Coronary artery bypass graft  09/29/2011    Procedure: CORONARY ARTERY BYPASS GRAFTING (CABG);  Surgeon: [name], MD;  Location: [place];  Service: Open Heart Surgery;  Laterality: N/A;  Coronary Artery Bypass Graft times two utilizing Nicholas left internal mammary artery and Nicholas right greater saphenous vein harvested endoscopically.  . Colonoscopy N/A 07/24/2012    Procedure: COLONOSCOPY;  Surgeon: [name], MD;  Location: [facility];  Service: Endoscopy;  Laterality: N/A;  . Hip pinning,cannulated Left 12/31/2013    Procedure: CANNULATED HIP PINNING;  Surgeon: Renette Butters, MD;  Location: [place];  Service:  Orthopedics;  Laterality: Left;  Carm, FX Table, [employer]  . Left heart catheterization with coronary angiogram N/A 09/27/2011    Procedure: LEFT HEART CATHETERIZATION WITH CORONARY ANGIOGRAM;  Surgeon: [name], MD;  Location: [name];  Service: Cardiovascular;  Laterality: N/A;  . Colonoscopy with propofol N/A 05/13/2014    Procedure: COLONOSCOPY WITH PROPOFOL;  Surgeon: [name], MD;  Location: [facility];  Service: Endoscopy;  Laterality: N/A;  . Enucleation Bilateral   . Colonoscopy      History   Social History  . Marital Status: Married    Spouse Name: N/A  . Number of Children: N/A  . Years of Education: N/A   Occupational History  . Not on file.   Social History Main Topics  . Smoking status: Never Smoker   . Smokeless tobacco: Never Used  . Alcohol Use: No  . Drug Use: No  . Sexual Activity: Yes   Other Topics Concern  . Not on file   Social History Narrative    Family History  Problem Relation Age of Onset  . Cancer    . Coronary artery disease    . Kidney disease    . Breast cancer    . Ovarian cancer    . Lung cancer    . Diabetes    . Cancer Mother 38    breast, spine and ovarian  . Heart disease Mother   . Hyperlipidemia Mother   . Hypertension Mother   . Varicose Veins Mother   . Emphysema Father   . Cancer Father 47    kidney, prostate  . Heart disease Father   . Hyperlipidemia Father   . Hypertension Father   . Cancer Sister     ovarian  . Cancer Brother     lung  . Heart disease Brother   . Hyperlipidemia Brother   . Hypertension Brother   . Heart attack Brother   . Peripheral vascular disease Brother   . Malignant hyperthermia Neg Hx     Anti-infectives: Anti-infectives    None      Current Facility-Administered Medications  Medication Dose Route Frequency Provider Last Rate Last Dose  . insulin aspart (novoLOG) injection 0-9 Units  0-9 Units Subcutaneous TID WC Theodis Blaze, MD       Current Outpatient  Prescriptions  Medication Sig Dispense Refill  . aspirin 81 MG tablet Take 81 mg by mouth daily.    [name] atorvastatin (LIPITOR) 10 MG tablet TAKE 1 TABLET BY MOUTH EVERY DAY 30 tablet 6  . b complex-vitamin c-folic acid (  NEPHRO-VITE) 0.8 MG TABS Take 0.8 mg by mouth at bedtime.    . calcium acetate (PHOSLO) 667 MG capsule Take 2,001 mg by mouth 3 (three) times daily with meals.     . darbepoetin (ARANESP) 25 MCG/0.42ML SOLN injection Inject 0.42 mLs (25 mcg total) into Nicholas vein every Wednesday with hemodialysis. 14.28 mL 1  . midodrine (PROAMATINE) 10 MG tablet Take 10 mg by mouth 2 (two) times daily.    . sevelamer carbonate (RENVELA) 800 MG tablet Take 1,600 mg by mouth 3 (three) times daily with meals.     [name] acetaminophen (TYLENOL) 325 MG tablet Take 2 tablets (650 mg total) by mouth every 6 (six) hours as needed for mild pain (or Fever >/= 101). ([name] not taking: Reported on 04/30/2014)    . alum & mag hydroxide-simeth (MAALOX/MYLANTA) 200-200-20 MG/5ML suspension Take 30 mLs by mouth every 4 (four) hours as needed for indigestion or heartburn. ([name] not taking: Reported on 04/30/2014) 355 mL 0  . aspirin EC 325 MG EC tablet Take 1 tablet (325 mg total) by mouth daily with breakfast. ([name] not taking: Reported on 04/30/2014) 30 tablet 0  . aspirin EC 325 MG tablet Take 1 tablet (325 mg total) by mouth daily. ([name] not taking: Reported on 04/30/2014) 30 tablet 0  . docusate sodium (COLACE) 100 MG capsule Take 1 capsule (100 mg total) by mouth 2 (two) times daily. Continue this while taking narcotics to help with bowel movements ([name] not taking: Reported on 04/30/2014) 30 capsule 1  . HYDROcodone-acetaminophen (NORCO) 5-325 MG per tablet Take 1-2 tablets by mouth every 4 (four) hours as needed for moderate pain. ([name] not taking: Reported on 04/30/2014) 90 tablet 0  . insulin aspart (NOVOLOG) 100 UNIT/ML injection Inject 0-9 Units into Nicholas skin 3 (three) times daily with meals. ([name]  not taking: Reported on 04/30/2014) 10 mL 11  . ondansetron (ZOFRAN) 4 MG tablet Take 1 tablet (4 mg total) by mouth every 8 (eight) hours as needed for nausea. ([name] not taking: Reported on 07/28/2014) 60 tablet 0  . oxyCODONE-acetaminophen (PERCOCET/ROXICET) 5-325 MG per tablet Take 1-2 tablets by mouth every 4 (four) hours as needed for severe pain. ([name] not taking: Reported on 04/30/2014) 30 tablet 0  . polyethylene glycol (MIRALAX / GLYCOLAX) packet Take 17 g by mouth daily as needed for mild constipation. ([name] not taking: Reported on 04/30/2014) 14 each 0  . zolpidem (AMBIEN) 5 MG tablet Take 1 tablet (5 mg total) by mouth at bedtime as needed and may repeat dose one time if needed for sleep. ([name] not taking: Reported on 04/30/2014) 30 tablet 0   PFSH reviewed.   [name] had a hip fx on Nicholas left with ORIF since I last saw him.      Objective: Vital signs in last 24 hours: Temp:  [97.7 F (36.5 C)] 97.7 F (36.5 C) (04/25 1142) Pulse Rate:  [66-74] 66 (04/25 1130) Resp:  [11-20] 11 (04/25 1130) BP: (155-187)/(74-94) 155/79 mmHg (04/25 1130) SpO2:  [95 %-100 %] 97 % (04/25 1130) Weight:  [92.987 kg (205 lb)] 92.987 kg (205 lb) (04/25 0641)  Intake/Output from previous day:   Intake/Output this shift: Total I/O In: -  Out: 300 [Urine:300]   Physical Exam  Constitutional: [name] is oriented to person, place, and time and well-developed, well-nourished, and in no distress.  Eyes:  blind  Neck: Normal range of motion. Neck supple.  Cardiovascular: Normal rate and regular rhythm.   Pulmonary/Chest: Effort normal and  breath sounds normal.  Abdominal: Soft. [name] exhibits no mass. There is tenderness (LUQ and LLQ moderate with CVAT). There is no guarding.  Genitourinary: Penis normal.  Scrotal contents normal.   Rectal not done  Musculoskeletal: Normal range of motion. [name] exhibits no edema or tenderness.  Right foot amputation  Neurological: [name] is alert and oriented to person,  place, and time.  Skin: Skin is warm and dry.  Psychiatric: Mood and affect normal.  Vitals reviewed.   Lab Results:   Recent Labs  07/28/14 0645 07/28/14 0654  WBC 5.7  --   HGB 12.5* 12.2*  HCT 36.6* 36.0*  PLT 97*  --    BMET  Recent Labs  07/28/14 0645 07/28/14 0654  NA 138 137  K 5.5* 5.4*  CL 94* 99  CO2 24  --   GLUCOSE 208* 210*  BUN 144* 135*  CREATININE 9.83* 9.10*  CALCIUM 10.2  --    PT/INR  Recent Labs  07/28/14 0645  LABPROT 13.1  INR 0.98   ABG No results for input(s): PHART, HCO3 in Nicholas last 72 hours.  Invalid input(s): PCO2, PO2  Studies/Results: Ct Abdomen Pelvis W Contrast  07/28/2014   CLINICAL DATA:  Nausea and vomiting beginning today. Renal failure [name].  EXAM: CT ABDOMEN AND PELVIS WITH CONTRAST  TECHNIQUE: Multidetector CT imaging of Nicholas abdomen and pelvis was performed using Nicholas standard protocol following bolus administration of intravenous contrast.  CONTRAST:  100 mL OMNIPAQUE IOHEXOL 300 MG/ML  SOLN  COMPARISON:  MRI abdomen 01/03/2012. CT abdomen and pelvis 02/03/2003.  FINDINGS: Dependent atelectasis is seen in Nicholas lung bases, more notable on Nicholas right. No pleural or pericardial effusion. Coronary artery disease is noted. [name] is status post CABG.  Nicholas gallbladder has been removed. Mild intrahepatic biliary ductal dilatation is likely related to [name]'s age and gallbladder removal. Nicholas spleen adrenal glands are unremarkable. Nicholas pancreas is atrophic but otherwise normal in appearance.  There is mild left hydronephrosis and dilatation of Nicholas left ureter. Stranding is seen about Nicholas mid and distal ureter. No stone or other focally obstructing lesion is visualized on this examination. Cortical defect and calcification in Nicholas midpole of Nicholas left kidney posteriorly is compatible with prior radiofrequency ablation. An exophytic lesion off Nicholas upper pole of Nicholas right kidney measures 3.6 x 3.6 cm compared to 3.7 x 3.8 cm on Nicholas prior  MRI and is consistent with prior radiofrequency ablation. A new nodule is seen off Nicholas posterior and lateral margin of Nicholas RFA site measuring 0.9 cm in diameter and demonstrating Hounsfield units of 58.3 on early phase imaging and 36.1 on delayed imaging.  A large stool ball seen Nicholas rectosigmoid colon Nicholas colon is otherwise unremarkable. There is no small bowel obstruction. No lymphadenopathy or fluid is identified.  No lytic or sclerotic bony lesion is seen. [name] is status post fixation of remote left hip fracture. Degenerative disease present about both hips.  IMPRESSION: New mild left hydronephrosis with stranding about Nicholas mid to distal left ureter. Cause for this finding is not identified. It could be secondary to recent stone passage, stricture of Nicholas ureter or urothelial neoplasm.  New nodule at Nicholas RFA site on Nicholas right could be due to tumor recurrence. Neurologic consultation is recommended for evaluation of this finding in [name]'s hydronephrosis described above.   Electronically Signed   By: Inge Rise M.D.   On: 07/28/2014 09:16    Case discussed with Dr. Doyle Askew, labs  reviewed, CT films and report reviewed.   Assessment: [name] has left ureteral obstruction distally of uncertain cause with persistent pain. [name] has a possible recurrence at Nicholas site of his prior right renal RFA.   Plan: I am going to keep him NPO and get him set up for cystoscopy with left RTG, possible ureteroscopy and stenting this evening.   I will discuss his renal lesion with [name] to see if a repeat biopsy and RFA might be needed.   CC: Deloria Lair, Dr. Clair Gulling Deterding, Dr. Ruta Hinds and Dr. Aletta Edouard.    LOS: 0 days    Malka So 07/28/2014

## 2014-07-28 NOTE — ED Notes (Signed)
Admitting MD at bedside.

## 2014-07-28 NOTE — ED Notes (Signed)
6E informed of plan for surgery and transport.

## 2014-07-28 NOTE — Discharge Instructions (Signed)
Ureteral Stent Implantation Ureteral stent implantation is the implantation of a soft plastic tube with multiple holes into the tube that drains urine from your kidney to your bladder (ureter). The stent helps drain your kidney when there is a blockage of the flow of urine in your ureter. The stent has a coil on each end to keep it from falling out. One end stays in the kidney. The other end stays in the bladder. It is most often taken out after any blockage has been removed or your ureter has healed. Short-term stents have a string attached to make removal quite easy. Removal of a short-term stent can be done in your health care provider's office or by you at home. Long-term stents need to be changed every few months. LET [facility] CARE PROVIDER KNOW ABOUT:  Any allergies you have.  All medicines you are taking, including vitamins, herbs, eye drops, creams, and over-the-counter medicines.  Previous problems you or members of your family have had with the use of anesthetics.  Any blood disorders you have.  Previous surgeries you have had.  Medical conditions you have. RISKS AND COMPLICATIONS Generally, ureteral stent implantation is a safe procedure. However, as with any procedure, complications can occur. Possible complications include:  Movement of the stent away from where it was originally placed (migration). This may affect the ability of the stent to properly drain your kidney. If migration of the stent occurs, the stent may need to be replaced or repositioned.  Perforation of the ureter.  Infection. BEFORE THE PROCEDURE  You may be asked to wash your genital area with sterile soap the morning of your procedure.  You may be given an oral antibiotic which you should take with a sip of water as prescribed by your health care provider.  You may be asked to not eat or drink for 8 hours before the surgery. PROCEDURE  First you will be given an anesthetic so you do not feel pain  during the procedure.  Your health care provider will insert a special lighted instrument called a cystoscope into your bladder. This allows your health care provider to see the opening to your ureter.  A thin wire is carefully threaded into your bladder and up the ureter. The stent is inserted over the wire and the wire is then removed.  Your bladder will be emptied of urine. AFTER THE PROCEDURE You will be taken to a recovery room until it is okay for you to go home. Document Released: 03/18/2000 Document Revised: 03/26/2013 Document Reviewed: 08/28/2012 [facility] Patient Information 2015 [place], [place]. This information is not intended to replace advice given to you by your health care provider. Make sure you discuss any questions you have with your health care provider.

## 2014-07-28 NOTE — ED Notes (Signed)
Patient states he woke up this am with nausea and vomiting, scheduled to have dialysis today.  Moaning and sticking finger in mouth, has lower abdominal pain.

## 2014-07-28 NOTE — Anesthesia Postprocedure Evaluation (Signed)
  Anesthesia Post-op Note  Patient: [name]  Procedure(s) Performed: Procedure(s) (LRB): CYSTOSCOPY WITH RETROGRADE PYELOGRAM, URETERAL BALLOON DILITATION, URETEROSCOPY AND LEFT STENT PLACEMENT (Left)  Patient Location: PACU  Anesthesia Type: General  Level of Consciousness: awake and alert   Airway and Oxygen Therapy: Patient Spontanous Breathing  Post-op Pain: mild  Post-op Assessment: Post-op Vital signs reviewed, Patient's Cardiovascular Status Stable, Respiratory Function Stable, Patent Airway and No signs of Nausea or vomiting  Last Vitals:  Filed Vitals:   07/28/14 2043  BP: 140/68  Pulse: 69  Temp: 36.7 C  Resp: 17    Post-op Vital Signs: stable   Complications: No apparent anesthesia complications

## 2014-07-28 NOTE — Progress Notes (Addendum)
New Admission Note:   Arrival Method: Stretcher from ED Mental Orientation: A&O  Telemetry: Initiated  Assessment: not completed Skin: Bottom red, but blanchable IV: R. Arm Saline locked Pain: 0/10 no pain  Tubes: 02 at 2L Safety Measures: Safety Fall Prevention Plan has been given, discussed and signed Admission: Completed 6 [place] Orientation: Patient has been orientated to the room, unit and staff.  Family: Wife and best friend at bedside   Orders have been reviewed and implemented. Will continue to monitor the patient. Call light has been placed within reach and bed alarm has been activated.   Alcide Evener BSN, RN Phone number: [phone]

## 2014-07-28 NOTE — Transfer of Care (Signed)
Immediate Anesthesia Transfer of Care Note  Patient: [name]  Procedure(s) Performed: Procedure(s): CYSTOSCOPY WITH RETROGRADE PYELOGRAM, URETERAL BALLOON DILITATION, URETEROSCOPY AND LEFT STENT PLACEMENT (Left)  Patient Location: PACU  Anesthesia Type:General  Level of Consciousness: awake, sedated and patient cooperative  Airway & Oxygen Therapy: Patient Spontanous Breathing and Patient connected to face mask oxygen  Post-op Assessment: Report given to RN and Post -op Vital signs reviewed and stable  Post vital signs: Reviewed and stable  Last Vitals:  Filed Vitals:   07/28/14 1452  BP: 147/80  Pulse: 68  Temp: 36.6 C  Resp: 18    Complications: No apparent anesthesia complications

## 2014-07-28 NOTE — Progress Notes (Signed)
07/28/2014 8:52 PM  Transfer Note:   Traveling Method: Via stretcher with [facility] RN Transferring Unit: [facility]  Mental Orientation: Alert and oriented X4. Blind in both eyes Telemetry: Per MD orders Assessment: Completed Skin: Warm, dry and intact. Left shin abrasion, right heel skin tear. Right toes amputated (old and healed) IV: Clean, dry and intact. Pain: Stated he felt fine right now  Tubes: Left arm fistula Safety Measures: Safety Fall Prevention Plan has been given, discussed and signed Admission: Completed 6 [place] Orientation: Patient has been orientated to the room, unit and staff.  Family: Wife, son and family friends at the bedside.  Transferring Incident::  Patient was transferred to [place] for surgery and returned to his already assigned bed 6E30  Orders have been reviewed and implemented. Will continue to monitor the patient. Call light has been placed within reach and bed alarm has been activated.   [employer], RN-BC, RN3 [facility] Phone number: [phone]

## 2014-07-28 NOTE — Anesthesia Preprocedure Evaluation (Signed)
Anesthesia Evaluation  Patient identified by MRN, date of birth, ID band Patient awake    Reviewed: Allergy & Precautions, NPO status , Patient's Chart, lab work & pertinent test results  Airway Mallampati: II  TM Distance: >3 FB Neck ROM: Full    Dental no notable dental hx.    Pulmonary pneumonia -, resolved,  breath sounds clear to auscultation  Pulmonary exam normal       Cardiovascular Exercise Tolerance: Good hypertension, Pt. on medications + CAD and + Peripheral Vascular Disease Rhythm:Regular Rate:Normal     Neuro/Psych PSYCHIATRIC DISORDERS Depression negative neurological ROS     GI/Hepatic negative GI ROS, Neg liver ROS,   Endo/Other  diabetes, Type 2, Oral Hypoglycemic Agents  Renal/GU Dialysis and ESRFRenal disease  negative genitourinary   Musculoskeletal negative musculoskeletal ROS (+)   Abdominal   Peds negative pediatric ROS (+)  Hematology  (+) anemia ,   Anesthesia Other Findings   Reproductive/Obstetrics negative OB ROS                             Anesthesia Physical  Anesthesia Plan  ASA: IV  Anesthesia Plan: General   Post-op Pain Management:    Induction: Intravenous  Airway Management Planned: LMA  Additional Equipment:   Intra-op Plan:   Post-operative Plan:   Informed Consent: I have reviewed the patients History and Physical, chart, labs and discussed the procedure including the risks, benefits and alternatives for the proposed anesthesia with the patient or authorized representative who has indicated his/her understanding and acceptance.   Dental advisory given  Plan Discussed with: CRNA  Anesthesia Plan Comments:         Anesthesia Quick Evaluation

## 2014-07-29 ENCOUNTER — Ambulatory Visit (HOSPITAL_COMMUNITY): Admission: RE | Admit: 2014-07-29 | Payer: Medicare Other | Source: Ambulatory Visit | Admitting: Vascular Surgery

## 2014-07-29 ENCOUNTER — Inpatient Hospital Stay (HOSPITAL_COMMUNITY): Payer: Medicare Other | Admitting: Anesthesiology

## 2014-07-29 ENCOUNTER — Encounter (HOSPITAL_COMMUNITY)
Admission: EM | Disposition: A | Discharge status: Home / Self Care | Payer: Self-pay | Source: Home / Self Care | Attending: Internal Medicine

## 2014-07-29 ENCOUNTER — Encounter (HOSPITAL_COMMUNITY): Payer: Self-pay | Admitting: Urology

## 2014-07-29 DIAGNOSIS — N186 End stage renal disease: Secondary | ICD-10-CM

## 2014-07-29 DIAGNOSIS — N1330 Unspecified hydronephrosis: Secondary | ICD-10-CM

## 2014-07-29 DIAGNOSIS — G43A0 Cyclical vomiting, not intractable: Secondary | ICD-10-CM

## 2014-07-29 DIAGNOSIS — T82898A Other specified complication of vascular prosthetic devices, implants and grafts, initial encounter: Secondary | ICD-10-CM | POA: Diagnosis not present

## 2014-07-29 DIAGNOSIS — I739 Peripheral vascular disease, unspecified: Secondary | ICD-10-CM

## 2014-07-29 DIAGNOSIS — R1115 Cyclical vomiting syndrome unrelated to migraine: Secondary | ICD-10-CM | POA: Insufficient documentation

## 2014-07-29 DIAGNOSIS — Z992 Dependence on renal dialysis: Secondary | ICD-10-CM

## 2014-07-29 DIAGNOSIS — D696 Thrombocytopenia, unspecified: Secondary | ICD-10-CM

## 2014-07-29 HISTORY — PX: LIGATION OF COMPETING BRANCHES OF ARTERIOVENOUS FISTULA: SHX5949

## 2014-07-29 HISTORY — DX: Hypotension, unspecified: I95.9

## 2014-07-29 LAB — RENAL FUNCTION PANEL
Albumin: 3.2 g/dL — ABNORMAL LOW (ref 3.5–5.2)
Anion gap: 17 — ABNORMAL HIGH (ref 5–15)
BUN: 152 mg/dL — AB (ref 6–23)
CALCIUM: 9.3 mg/dL (ref 8.4–10.5)
CO2: 23 mmol/L (ref 19–32)
Chloride: 99 mmol/L (ref 96–112)
Creatinine, Ser: 10.91 mg/dL — ABNORMAL HIGH (ref 0.50–1.35)
GFR, EST AFRICAN AMERICAN: 5 mL/min — AB (ref >90)
GFR, EST NON AFRICAN AMERICAN: 4 mL/min — AB (ref >90)
GLUCOSE: 98 mg/dL (ref 70–99)
Phosphorus: 5.8 mg/dL — ABNORMAL HIGH (ref 2.3–4.6)
Potassium: 6.1 mmol/L (ref 3.5–5.1)
SODIUM: 139 mmol/L (ref 135–145)

## 2014-07-29 LAB — CBC
HCT: 31 % — ABNORMAL LOW (ref 39.0–52.0)
Hemoglobin: 10.5 g/dL — ABNORMAL LOW (ref 13.0–17.0)
MCH: 33.5 pg (ref 26.0–34.0)
MCHC: 33.9 g/dL (ref 30.0–36.0)
MCV: 99 fL (ref 78.0–100.0)
PLATELETS: 87 10*3/uL — AB (ref 150–400)
RBC: 3.13 MIL/uL — AB (ref 4.22–5.81)
RDW: 13.4 % (ref 11.5–15.5)
WBC: 4.5 10*3/uL (ref 4.0–10.5)

## 2014-07-29 LAB — URINE CULTURE
Colony Count: NO GROWTH
Culture: NO GROWTH
Special Requests: NORMAL

## 2014-07-29 LAB — HEMOGLOBIN A1C
HEMOGLOBIN A1C: 5.6 % (ref 4.8–5.6)
Mean Plasma Glucose: 114 mg/dL

## 2014-07-29 LAB — GLUCOSE, CAPILLARY
Glucose-Capillary: 81 mg/dL (ref 70–99)
Glucose-Capillary: 86 mg/dL (ref 70–99)
Glucose-Capillary: 72 mg/dL (ref 70–99)

## 2014-07-29 LAB — HEPATITIS B SURFACE ANTIGEN: HEP B S AG: NEGATIVE

## 2014-07-29 SURGERY — LIGATION OF COMPETING BRANCHES OF ARTERIOVENOUS FISTULA
Anesthesia: Monitor Anesthesia Care | Site: Arm Lower | Laterality: Left

## 2014-07-29 MED ORDER — LIDOCAINE HCL (PF) 1 % IJ SOLN
INTRAMUSCULAR | Status: DC | PRN
  Administered 2014-07-29: 9 mL

## 2014-07-29 MED ORDER — PROPOFOL INFUSION 10 MG/ML OPTIME
INTRAVENOUS | Status: DC | PRN
  Administered 2014-07-29: 75 ug/kg/min via INTRAVENOUS

## 2014-07-29 MED ORDER — LIDOCAINE HCL (PF) 1 % IJ SOLN
INTRAMUSCULAR | Status: AC
  Filled 2014-07-29: qty 30

## 2014-07-29 MED ORDER — PHENYLEPHRINE HCL 10 MG/ML IJ SOLN
INTRAMUSCULAR | Status: DC | PRN
  Administered 2014-07-29: 80 ug via INTRAVENOUS
  Administered 2014-07-29 (×2): 40 ug via INTRAVENOUS

## 2014-07-29 MED ORDER — MIDAZOLAM HCL 2 MG/2ML IJ SOLN
INTRAMUSCULAR | Status: AC
  Filled 2014-07-29: qty 2

## 2014-07-29 MED ORDER — CEFUROXIME SODIUM 1.5 G IJ SOLR
1.5000 g | INTRAMUSCULAR | Status: AC
  Administered 2014-07-29: 1.5 g via INTRAVENOUS
  Filled 2014-07-29: qty 1.5

## 2014-07-29 MED ORDER — 0.9 % SODIUM CHLORIDE (POUR BTL) OPTIME
TOPICAL | Status: DC | PRN
  Administered 2014-07-29: 1000 mL

## 2014-07-29 MED ORDER — FENTANYL CITRATE (PF) 100 MCG/2ML IJ SOLN
INTRAMUSCULAR | Status: DC | PRN
  Administered 2014-07-29: 50 ug via INTRAVENOUS

## 2014-07-29 MED ORDER — SODIUM CHLORIDE 0.9 % IV SOLN
INTRAVENOUS | Status: DC
  Administered 2014-07-29: 13:00:00 via INTRAVENOUS

## 2014-07-29 MED ORDER — FENTANYL CITRATE (PF) 250 MCG/5ML IJ SOLN
INTRAMUSCULAR | Status: AC
  Filled 2014-07-29: qty 5

## 2014-07-29 MED ORDER — ONDANSETRON HCL 4 MG/2ML IJ SOLN
INTRAMUSCULAR | Status: DC | PRN
  Administered 2014-07-29: 4 mg via INTRAVENOUS

## 2014-07-29 MED ORDER — SODIUM CHLORIDE 0.9 % IV SOLN
INTRAVENOUS | Status: DC | PRN
  Administered 2014-07-29: 12:00:00 via INTRAVENOUS

## 2014-07-29 MED ORDER — THROMBIN 5000 UNITS EX SOLR
CUTANEOUS | Status: AC
  Filled 2014-07-29: qty 5000

## 2014-07-29 SURGICAL SUPPLY — 27 items
CANISTER SUCTION 2500CC (MISCELLANEOUS) ×2 IMPLANT
COVER PROBE W GEL 5X96 (DRAPES) ×1 IMPLANT
ELECT REM PT RETURN 9FT ADLT (ELECTROSURGICAL) ×2
ELECTRODE REM PT RTRN 9FT ADLT (ELECTROSURGICAL) ×1 IMPLANT
GEL ULTRASOUND 20GR AQUASONIC (MISCELLANEOUS) IMPLANT
GLOVE BIO SURGEON STRL SZ7.5 (GLOVE) ×2 IMPLANT
GLOVE BIOGEL PI IND STRL 7.0 (GLOVE) IMPLANT
GLOVE BIOGEL PI INDICATOR 7.0 (GLOVE) ×3
GLOVE ECLIPSE 6.5 STRL STRAW (GLOVE) ×1 IMPLANT
GOWN STRL REUS W/ TWL LRG LVL3 (GOWN DISPOSABLE) ×3 IMPLANT
GOWN STRL REUS W/TWL LRG LVL3 (GOWN DISPOSABLE) ×4
KIT BASIN OR (CUSTOM PROCEDURE TRAY) ×2 IMPLANT
KIT ROOM TURNOVER OR (KITS) ×2 IMPLANT
LIQUID BAND (GAUZE/BANDAGES/DRESSINGS) ×3 IMPLANT
LOOP VESSEL MINI RED (MISCELLANEOUS) IMPLANT
NS IRRIG 1000ML POUR BTL (IV SOLUTION) ×2 IMPLANT
PACK CV ACCESS (CUSTOM PROCEDURE TRAY) ×2 IMPLANT
PAD ARMBOARD 7.5X6 YLW CONV (MISCELLANEOUS) ×4 IMPLANT
SPONGE SURGIFOAM ABS GEL 100 (HEMOSTASIS) IMPLANT
SUT ETHILON 3 0 PS 1 (SUTURE) ×2 IMPLANT
SUT PROLENE 6 0 CC (SUTURE) IMPLANT
SUT SILK 0 (SUTURE) IMPLANT
SUT VIC AB 3-0 SH 27 (SUTURE) ×2
SUT VIC AB 3-0 SH 27X BRD (SUTURE) ×1 IMPLANT
SUT VICRYL 4-0 PS2 18IN ABS (SUTURE) ×2 IMPLANT
UNDERPAD 30X30 INCONTINENT (UNDERPADS AND DIAPERS) ×2 IMPLANT
WATER STERILE IRR 1000ML POUR (IV SOLUTION) ×2 IMPLANT

## 2014-07-29 NOTE — Consult Note (Signed)
Reason for Consult: Continuity of ESRD care Referring Physician: [name] MD [facility])  HPI:  64 year old Caucasian man with past medical history significant for ESRD on a Monday/Wednesday/Friday dialysis schedule who was admitted yesterday with intractable nausea, vomiting and left lower quadrant pain with consequent imaging showing acute left hydronephrosis with a left ureteral stone/clot. He was then emergently transferred to [facility] to undergo ureteral cystoscopy with balloon dilatation of the left distal ureteral stricture, left ureteral stent placement and removal of multiple bladder stones. Overnight, his symptoms have been alleviated and currently this morning on dialysis, he denies any complaints.  He denies any further abdominal pain and does not have any active nausea/vomiting. He denies any fevers or chills prior to his hospitalization and some nonspecific malaise/fatigue, denies any complaints.  He was scheduled electively as an outpatient to undergo ligation of collateral veins of his left radiocephalic fistula and vascular surgery have been informed of his admission.  Past Medical History  Diagnosis Date  . CAD, NATIVE VESSEL 01/08/2008    a. Myoview 08/30/11 at [facility]: No scar, + mild apical, apical lateral, mid anterolateral ischemia, EF 49% => s/p CABG (L-LAD, S-OM)  . HYPERLIPIDEMIA-MIXED 01/08/2008  . OVERWEIGHT/OBESITY 01/08/2008  . Anemia   . Blood transfusion     "related to anemia; foot surgery followed by MRSA & many times since"  . Bruises easily   . Blind     both eyes removed   . Family history of breast cancer     mother  . Cellulitis late 1980's    "hospitalized; wrapped both legs; several times; no OR for this"  . Peripheral vascular disease   . Irregular heartbeat   . Colon polyps   . HYPERTENSION 01/27/2009    BP low  . Hypertension due to AV shunt     left forearm  . Hypotension   . CHF (congestive heart failure)   . Prostate cancer   . Pneumonia  2000;s X 1  . DM type 2 (diabetes mellitus, type 2)     no medications (07/28/2014)  . Arthritis     "back, neck" (07/28/2014)  . ESRD (end stage renal disease) on dialysis     [employer]; Mon, Wed, Fri (07/28/2014)  . Renal insufficiency   . Renal cell carcinoma 2001 and 2003    "both kidneys"    Past Surgical History  Procedure Laterality Date  . Foot amputation Right ~ 2002    right;  partial; "infection"  . Colonoscopy  06/14/2011    Procedure: COLONOSCOPY;  Surgeon: [name], MD;  Location: [facility]OSCOPY;  Service: Endoscopy;  Laterality: N/A;  . Hot hemostasis  06/14/2011    Procedure: HOT HEMOSTASIS (ARGON PLASMA COAGULATION/BICAP);  Surgeon: [name], MD;  Location: [facility];  Service: Endoscopy;  Laterality: N/A;  . Av fistula placement Left 02/2010    LFA  . Varicose vein surgery  mid 1980's     BLE; "knees down; both legs; 2 separate times"  . Radiofrequency ablation kidney  2010-2012    "twice; one on each side; for cancer"  . Cardiac catheterization  ~ 2001  . Coronary artery bypass graft  09/29/2011    Procedure: CORONARY ARTERY BYPASS GRAFTING (CABG);  Surgeon: [name], MD;  Location: [place];  Service: Open Heart Surgery;  Laterality: N/A;  Coronary Artery Bypass Graft times two utilizing the left internal mammary artery and the right greater saphenous vein harvested endoscopically.  . Colonoscopy N/A 07/24/2012  Procedure: COLONOSCOPY;  Surgeon: [name], MD;  Location: [facility];  Service: Endoscopy;  Laterality: N/A;  . Hip pinning,cannulated Left 12/31/2013    Procedure: CANNULATED HIP PINNING;  Surgeon: Renette Butters, MD;  Location: [facility];  Service: Orthopedics;  Laterality: Left;  Carm, FX Table, [employer]  . Left heart catheterization with coronary angiogram N/A 09/27/2011    Procedure: LEFT HEART CATHETERIZATION WITH CORONARY ANGIOGRAM;  Surgeon: [name], MD;  Location: [facility] CATH LAB;  Service: Cardiovascular;  Laterality:  N/A;  . Colonoscopy with propofol N/A 05/13/2014    Procedure: COLONOSCOPY WITH PROPOFOL;  Surgeon: [name], MD;  Location: [facility];  Service: Endoscopy;  Laterality: N/A;  . Colonoscopy    . Enucleation  2003; 2006    bilateral; "diabetes; pain"  . Fracture surgery    . Cholecystectomy open  1992  . Insertion prostate radiation seed  03/2012  . Cystoscopy with retrograde pyelogram, ureteroscopy and stent placement Left 07/28/2014    Procedure: CYSTOSCOPY WITH RETROGRADE PYELOGRAM, URETERAL BALLOON DILITATION, URETEROSCOPY AND LEFT STENT PLACEMENT;  Surgeon: [name], MD;  Location: [facility];  Service: Urology;  Laterality: Left;    Family History  Problem Relation Age of Onset  . Cancer    . Coronary artery disease    . Kidney disease    . Breast cancer    . Ovarian cancer    . Lung cancer    . Diabetes    . Cancer Mother 76    breast, spine and ovarian  . Heart disease Mother   . Hyperlipidemia Mother   . Hypertension Mother   . Varicose Veins Mother   . Emphysema Father   . Cancer Father 23    kidney, prostate  . Heart disease Father   . Hyperlipidemia Father   . Hypertension Father   . Cancer Sister     ovarian  . Cancer Brother     lung  . Heart disease Brother   . Hyperlipidemia Brother   . Hypertension Brother   . Heart attack Brother   . Peripheral vascular disease Brother   . Malignant hyperthermia Neg Hx     Social History:  reports that he has never smoked. He has never used smokeless tobacco. He reports that he does not drink alcohol or use illicit drugs.  Allergies:  Allergies  Allergen Reactions  . Codeine Other (See Comments)    Makes patient incoherent. STATES MAKES HIM COMATOSE  . Tape Other (See Comments)    Plastic tape tears skin off, please use paper tape instead.    Medications:  Scheduled: . aspirin  81 mg Oral Daily  . atorvastatin  10 mg Oral Daily  . calcium acetate  2,001 mg Oral TID WC  . [START ON 07/30/2014] ferric  gluconate (FERRLECIT/NULECIT) IV  62.5 mg Intravenous Q Wed-HD  . heparin  5,000 Units Subcutaneous 3 times per day  . insulin aspart  0-9 Units Subcutaneous TID WC  . midodrine  10 mg Oral BID  . multivitamin  1 tablet Oral QHS  . sevelamer carbonate  1,600 mg Oral TID WC  . sodium chloride  3 mL Intravenous Q12H  . tamsulosin  0.4 mg Oral Daily    Results for orders placed or performed during the hospital encounter of 07/28/14 (from the past 48 hour(s))  CBC with Differential/Platelet     Status: Abnormal   Collection Time: 07/28/14  6:45 AM  Result Value Ref Range   WBC  5.7 4.0 - 10.5 K/uL   RBC 3.73 (L) 4.22 - 5.81 MIL/uL   Hemoglobin 12.5 (L) 13.0 - 17.0 g/dL    Comment: SPECIMEN CHECKED FOR CLOTS REPEATED TO VERIFY    HCT 36.6 (L) 39.0 - 52.0 %   MCV 98.1 78.0 - 100.0 fL   MCH 33.5 26.0 - 34.0 pg   MCHC 34.2 30.0 - 36.0 g/dL   RDW 13.1 11.5 - 15.5 %   Platelets 97 (L) 150 - 400 K/uL    Comment: PLATELET COUNT CONFIRMED BY SMEAR   Neutrophils Relative % 83 (H) 43 - 77 %   Neutro Abs 4.7 1.7 - 7.7 K/uL   Lymphocytes Relative 11 (L) 12 - 46 %   Lymphs Abs 0.6 (L) 0.7 - 4.0 K/uL   Monocytes Relative 4 3 - 12 %   Monocytes Absolute 0.2 0.1 - 1.0 K/uL   Eosinophils Relative 2 0 - 5 %   Eosinophils Absolute 0.1 0.0 - 0.7 K/uL   Basophils Relative 0 0 - 1 %   Basophils Absolute 0.0 0.0 - 0.1 K/uL  Comprehensive metabolic panel     Status: Abnormal   Collection Time: 07/28/14  6:45 AM  Result Value Ref Range   Sodium 138 135 - 145 mmol/L   Potassium 5.5 (H) 3.5 - 5.1 mmol/L   Chloride 94 (L) 96 - 112 mmol/L   CO2 24 19 - 32 mmol/L   Glucose, Bld 208 (H) 70 - 99 mg/dL   BUN 144 (H) 6 - 23 mg/dL   Creatinine, Ser 9.83 (H) 0.50 - 1.35 mg/dL   Calcium 10.2 8.4 - 10.5 mg/dL   Total Protein 7.3 6.0 - 8.3 g/dL   Albumin 4.0 3.5 - 5.2 g/dL   AST 12 0 - 37 U/L   ALT 13 0 - 53 U/L   Alkaline Phosphatase 77 39 - 117 U/L   Total Bilirubin 0.7 0.3 - 1.2 mg/dL   GFR calc non Af  Amer 5 (L) >90 mL/min   GFR calc Af Amer 6 (L) >90 mL/min    Comment: (NOTE) The eGFR has been calculated using the CKD EPI equation. This calculation has not been validated in all clinical situations. eGFR's persistently <90 mL/min signify possible Chronic Kidney Disease.    Anion gap 20 (H) 5 - 15  Lipase, blood     Status: Abnormal   Collection Time: 07/28/14  6:45 AM  Result Value Ref Range   Lipase 76 (H) 11 - 59 U/L  Protime-INR     Status: None   Collection Time: 07/28/14  6:45 AM  Result Value Ref Range   Prothrombin Time 13.1 11.6 - 15.2 seconds   INR 0.98 0.00 - 1.49  I-Stat CG4 Lactic Acid, ED     Status: None   Collection Time: 07/28/14  6:53 AM  Result Value Ref Range   Lactic Acid, Venous 0.92 0.5 - 2.0 mmol/L  I-stat chem 8, ed     Status: Abnormal   Collection Time: 07/28/14  6:54 AM  Result Value Ref Range   Sodium 137 135 - 145 mmol/L   Potassium 5.4 (H) 3.5 - 5.1 mmol/L   Chloride 99 96 - 112 mmol/L   BUN 135 (H) 6 - 23 mg/dL   Creatinine, Ser 9.10 (H) 0.50 - 1.35 mg/dL   Glucose, Bld 210 (H) 70 - 99 mg/dL   Calcium, Ion 1.20 1.13 - 1.30 mmol/L   TCO2 25 0 - 100 mmol/L   Hemoglobin  12.2 (L) 13.0 - 17.0 g/dL   HCT 36.0 (L) 39.0 - 52.0 %  Urinalysis, Routine w reflex microscopic     Status: Abnormal   Collection Time: 07/28/14 10:07 AM  Result Value Ref Range   Color, Urine YELLOW YELLOW   APPearance CLEAR CLEAR   Specific Gravity, Urine 1.012 1.005 - 1.030   pH 7.0 5.0 - 8.0   Glucose, UA 100 (A) NEGATIVE mg/dL   Hgb urine dipstick NEGATIVE NEGATIVE   Bilirubin Urine NEGATIVE NEGATIVE   Ketones, ur NEGATIVE NEGATIVE mg/dL   Protein, ur 30 (A) NEGATIVE mg/dL   Urobilinogen, UA 0.2 0.0 - 1.0 mg/dL   Nitrite NEGATIVE NEGATIVE   Leukocytes, UA NEGATIVE NEGATIVE  Urine microscopic-add on     Status: None   Collection Time: 07/28/14 10:07 AM  Result Value Ref Range   Squamous Epithelial / LPF RARE RARE   WBC, UA 0-2 <3 WBC/hpf   RBC / HPF 0-2 <3  RBC/hpf  Hemoglobin A1c     Status: None   Collection Time: 07/28/14 12:27 PM  Result Value Ref Range   Hgb A1c MFr Bld 5.6 4.8 - 5.6 %    Comment: (NOTE)         Pre-diabetes: 5.7 - 6.4         Diabetes: >6.4         Glycemic control for adults with diabetes: <7.0    Mean Plasma Glucose 114 mg/dL    Comment: (NOTE) Performed At: [facility] [place], [place] [id number] Lindon Romp MD KG:[id number]   CBG monitoring, ED     Status: Abnormal   Collection Time: 07/28/14  2:03 PM  Result Value Ref Range   Glucose-Capillary 155 (H) 70 - 99 mg/dL  Surgical pcr screen     Status: None   Collection Time: 07/28/14  3:22 PM  Result Value Ref Range   MRSA, PCR NEGATIVE NEGATIVE   Staphylococcus aureus NEGATIVE NEGATIVE    Comment:        The Xpert SA Assay (FDA approved for NASAL specimens in patients over 103 years of age), is one component of a comprehensive surveillance program.  Test performance has been validated by [facility] for patients greater than or equal to 70 year old. It is not intended to diagnose infection nor to guide or monitor treatment.   Glucose, capillary     Status: Abnormal   Collection Time: 07/28/14  4:52 PM  Result Value Ref Range   Glucose-Capillary 106 (H) 70 - 99 mg/dL  CBC     Status: Abnormal   Collection Time: 07/28/14  5:00 PM  Result Value Ref Range   WBC 5.9 4.0 - 10.5 K/uL   RBC 3.50 (L) 4.22 - 5.81 MIL/uL   Hemoglobin 11.7 (L) 13.0 - 17.0 g/dL   HCT 34.3 (L) 39.0 - 52.0 %   MCV 98.0 78.0 - 100.0 fL   MCH 33.4 26.0 - 34.0 pg   MCHC 34.1 30.0 - 36.0 g/dL   RDW 13.3 11.5 - 15.5 %   Platelets 95 (L) 150 - 400 K/uL    Comment: CONSISTENT WITH PREVIOUS RESULT  Creatinine, serum     Status: Abnormal   Collection Time: 07/28/14  5:00 PM  Result Value Ref Range   Creatinine, Ser 10.32 (H) 0.50 - 1.35 mg/dL   GFR calc non Af Amer 5 (L) >90 mL/min   GFR calc Af Amer 5 (L) >90 mL/min    Comment: (  NOTE) The eGFR  has been calculated using the CKD EPI equation. This calculation has not been validated in all clinical situations. eGFR's persistently <90 mL/min signify possible Chronic Kidney Disease.   Glucose, capillary     Status: Abnormal   Collection Time: 07/28/14  7:26 PM  Result Value Ref Range   Glucose-Capillary 106 (H) 70 - 99 mg/dL  Glucose, capillary     Status: Abnormal   Collection Time: 07/28/14  8:39 PM  Result Value Ref Range   Glucose-Capillary 111 (H) 70 - 99 mg/dL  CBC     Status: Abnormal   Collection Time: 07/29/14  8:02 AM  Result Value Ref Range   WBC 4.5 4.0 - 10.5 K/uL   RBC 3.13 (L) 4.22 - 5.81 MIL/uL   Hemoglobin 10.5 (L) 13.0 - 17.0 g/dL   HCT 31.0 (L) 39.0 - 52.0 %   MCV 99.0 78.0 - 100.0 fL   MCH 33.5 26.0 - 34.0 pg   MCHC 33.9 30.0 - 36.0 g/dL   RDW 13.4 11.5 - 15.5 %   Platelets 87 (L) 150 - 400 K/uL    Comment: CONSISTENT WITH PREVIOUS RESULT  Renal function panel     Status: Abnormal   Collection Time: 07/29/14  8:03 AM  Result Value Ref Range   Sodium 139 135 - 145 mmol/L   Potassium 6.1 (HH) 3.5 - 5.1 mmol/L    Comment: REPEATED TO VERIFY CRITICAL RESULT CALLED TO, READ BACK BY AND VERIFIED WITH: MAGODO,D RN @ 0845 07/29/14 LEONARD,A    Chloride 99 96 - 112 mmol/L   CO2 23 19 - 32 mmol/L   Glucose, Bld 98 70 - 99 mg/dL   BUN 152 (H) 6 - 23 mg/dL   Creatinine, Ser 10.91 (H) 0.50 - 1.35 mg/dL   Calcium 9.3 8.4 - 10.5 mg/dL   Phosphorus 5.8 (H) 2.3 - 4.6 mg/dL   Albumin 3.2 (L) 3.5 - 5.2 g/dL   GFR calc non Af Amer 4 (L) >90 mL/min   GFR calc Af Amer 5 (L) >90 mL/min    Comment: (NOTE) The eGFR has been calculated using the CKD EPI equation. This calculation has not been validated in all clinical situations. eGFR's persistently <90 mL/min signify possible Chronic Kidney Disease.    Anion gap 17 (H) 5 - 15    Ct Abdomen Pelvis W Contrast  07/28/2014   CLINICAL DATA:  Nausea and vomiting beginning today. Renal failure patient.  EXAM: CT  ABDOMEN AND PELVIS WITH CONTRAST  TECHNIQUE: Multidetector CT imaging of the abdomen and pelvis was performed using the standard protocol following bolus administration of intravenous contrast.  CONTRAST:  100 mL OMNIPAQUE IOHEXOL 300 MG/ML  SOLN  COMPARISON:  MRI abdomen 01/03/2012. CT abdomen and pelvis 02/03/2003.  FINDINGS: Dependent atelectasis is seen in the lung bases, more notable on the right. No pleural or pericardial effusion. Coronary artery disease is noted. The patient is status post CABG.  The gallbladder has been removed. Mild intrahepatic biliary ductal dilatation is likely related to patient's age and gallbladder removal. The spleen adrenal glands are unremarkable. The pancreas is atrophic but otherwise normal in appearance.  There is mild left hydronephrosis and dilatation of the left ureter. Stranding is seen about the mid and distal ureter. No stone or other focally obstructing lesion is visualized on this examination. Cortical defect and calcification in the midpole of the left kidney posteriorly is compatible with prior radiofrequency ablation. An exophytic lesion off the upper pole of the  right kidney measures 3.6 x 3.6 cm compared to 3.7 x 3.8 cm on the prior MRI and is consistent with prior radiofrequency ablation. A new nodule is seen off the posterior and lateral margin of the RFA site measuring 0.9 cm in diameter and demonstrating Hounsfield units of 58.3 on early phase imaging and 36.1 on delayed imaging.  A large stool ball seen the rectosigmoid colon the colon is otherwise unremarkable. There is no small bowel obstruction. No lymphadenopathy or fluid is identified.  No lytic or sclerotic bony lesion is seen. The patient is status post fixation of remote left hip fracture. Degenerative disease present about both hips.  IMPRESSION: New mild left hydronephrosis with stranding about the mid to distal left ureter. Cause for this finding is not identified. It could be secondary to recent  stone passage, stricture of the ureter or urothelial neoplasm.  New nodule at the RFA site on the right could be due to tumor recurrence. Neurologic consultation is recommended for evaluation of this finding in the patient's hydronephrosis described above.   Electronically Signed   By: Inge Rise M.D.   On: 07/28/2014 09:16    Review of Systems  Constitutional: Positive for malaise/fatigue. Negative for fever and chills.  HENT: Negative.   Respiratory: Negative.   Cardiovascular: Negative.   Gastrointestinal: Positive for heartburn, nausea, vomiting and abdominal pain. Negative for diarrhea and constipation.  Musculoskeletal: Positive for back pain. Negative for myalgias and neck pain.  Skin: Negative.   Neurological: Positive for weakness. Negative for dizziness, tingling and tremors.  Endo/Heme/Allergies: Negative.   Psychiatric/Behavioral: Negative.    Blood pressure 106/64, pulse 68, temperature 97.9 F (36.6 C), temperature source Oral, resp. rate 16, height _0  (1.93 m), weight 101 kg (222 lb 10.6 oz), SpO2 100 %. Physical Exam  Nursing note and vitals reviewed. Constitutional: He is oriented to person, place, and time. He appears well-developed and well-nourished. No distress.  HENT:  Head: Normocephalic and atraumatic.  Nose: Nose normal.  Mouth/Throat: No oropharyngeal exudate.  Neck: Normal range of motion. Neck supple. No JVD present.  Cardiovascular: Normal rate, regular rhythm and intact distal pulses.   No murmur heard. Respiratory: Effort normal and breath sounds normal. No respiratory distress. He has no wheezes. He has no rales.  GI: Soft. Bowel sounds are normal. He exhibits no distension. There is no tenderness. There is no rebound.  Musculoskeletal: He exhibits no edema.  Neurological: He is alert and oriented to person, place, and time.  Skin: Skin is warm and dry. No erythema.  Psychiatric: He has a normal mood and affect.    Assessment/Plan: 1. Acute  abdominal pain/nausea/vomiting secondary to left ureteral stone/hydronephrosis status post ureteral cystoscopy with urethral stricture dilation, left ureteral stent placement and removal of multiple bladder stones with significant symptomatic improvement. He is planned for outpatient urology follow-up/stent removal in 2-3 weeks. 2. ESRD: Hemodialysis being done today in place of his missed dialysis yesterday-fortunately he did not have any volume excess but had hyperkalemia noted on labs predialysis today. 3. Vascular access: Previously scheduled as an outpatient to undergo elective ligation of collateral veins-vascular surgery performed in order to try and get him on to schedule for today 4. Anemia of ESRD: Re-dose ESA-some overt losses noted associated with nephrolithiasis. Getting intravenous iron. 5. Metabolic bone disease: On phosphate binder with PhosLo and sevelamer-reconcile VDRA dosing. 6. Chronic hypotension: Resume midodrine and monitor carefully with ultrafiltration at hemodialysis.  Jaydin Jalomo K. 07/29/2014, 10:53 AM

## 2014-07-29 NOTE — Op Note (Addendum)
Procedure: Left arm av fistula side branch ligation, ultrasound localization PreOp: Poorly functioning left radial cephalic AVF PostOp: same Anesthesia: Local with sedation Asst: Nurse  Findings: 2 branches antecubital area, 1 branch mid forearm all ligated  Operative details: After obtaining informed consent, the patient was taken to the operating room. The patient was placed in supine position on the operating table. After adequate sedation, the patient's entire left upper extremity was prepped and draped in the usual sterile fashion.  Preoperative fistulogram showed two large side branches near the antecubital area and mid forearm.  All three were identified by ultrasound intraoperatively and marked.   An incision was made over each side branch.  Each was ligated and divided between silk ties after dissecting them free circumferentially.  Hemostasis was obtained with direct pressure. The skin was closed with 4 0 Vicryl subcuticular stitch at the antecubital level and the forearm incision closed with interrupted nylon sutures due to the frailty of the patient's skin. The patient are procedure well and there were no complications. Instrument sponge and needle counts were correct at the end of the case. Doppler was used to confirm flow throughout the course of the fistula at the conclusion of the case.  The patient was taken to the recovery room in stable condition.  Ruta Hinds, MD [facility] Office: [phone] Pager: [phone]  Pt is ready for d/c from our standpoint.  He needs follow up in 2 weeks for suture removal  Ruta Hinds, MD [facility]: [phone] Pager: [phone]

## 2014-07-29 NOTE — Transfer of Care (Signed)
Immediate Anesthesia Transfer of Care Note  Patient: [name]  Procedure(s) Performed: Procedure(s): LIGATION OF COMPETING BRANCHES OF LEFT ARM ARTERIOVENOUS FISTULA (Left)  Patient Location: PACU  Anesthesia Type:MAC  Level of Consciousness: awake, alert  and oriented  Airway & Oxygen Therapy: Patient Spontanous Breathing and Patient connected to nasal cannula oxygen  Post-op Assessment: Report given to RN and Post -op Vital signs reviewed and stable  Post vital signs: Reviewed and stable  Last Vitals:  Filed Vitals:   07/29/14 1153  BP: 107/55  Pulse: 69  Temp: 36.6 C  Resp: 18    Complications: No apparent anesthesia complications

## 2014-07-29 NOTE — Anesthesia Postprocedure Evaluation (Signed)
  Anesthesia Post-op Note  Patient: [name]  Procedure(s) Performed: Procedure(s): LIGATION OF COMPETING BRANCHES OF LEFT ARM ARTERIOVENOUS FISTULA (Left)  Patient Location: PACU  Anesthesia Type:MAC  Level of Consciousness: awake  Airway and Oxygen Therapy: Patient Spontanous Breathing  Post-op Pain: none  Post-op Assessment: Post-op Vital signs reviewed, Patient's Cardiovascular Status Stable, Respiratory Function Stable, Patent Airway, No signs of Nausea or vomiting and Pain level controlled  Post-op Vital Signs: Reviewed and stable  Last Vitals:  Filed Vitals:   07/29/14 1517  BP: 104/60  Pulse: 64  Temp: 36.4 C  Resp: 18    Complications: No apparent anesthesia complications

## 2014-07-29 NOTE — Procedures (Signed)
Patient seen on Hemodialysis. QB 300, UF goal 3.5L Treatment adjusted as needed.  Elmarie Shiley MD [facility]. Office # [phone] Pager # [phone] 10:31 AM

## 2014-07-29 NOTE — Anesthesia Preprocedure Evaluation (Addendum)
Anesthesia Evaluation  Patient identified by MRN, date of birth, ID band Patient awake    Reviewed: Allergy & Precautions, NPO status , Patient's Chart, lab work & pertinent test results  History of Anesthesia Complications Negative for: history of anesthetic complications  Airway Mallampati: II  TM Distance: >3 FB Neck ROM: Full    Dental  (+) Teeth Intact   Pulmonary neg pulmonary ROS,  breath sounds clear to auscultation        Cardiovascular hypertension, + CAD, + CABG, + Peripheral Vascular Disease and +CHF Rhythm:Regular     Neuro/Psych PSYCHIATRIC DISORDERS Depression negative neurological ROS     GI/Hepatic   Endo/Other  diabetes, Type 2  Renal/GU ESRF and DialysisRenal disease     Musculoskeletal  (+) Arthritis -,   Abdominal   Peds  Hematology  (+) anemia ,   Anesthesia Other Findings   Reproductive/Obstetrics                            Anesthesia Physical Anesthesia Plan  ASA: III  Anesthesia Plan: MAC and General   Post-op Pain Management:    Induction:   Airway Management Planned: LMA and Natural Airway  Additional Equipment: None  Intra-op Plan:   Post-operative Plan:   Informed Consent: I have reviewed the patients History and Physical, chart, labs and discussed the procedure including the risks, benefits and alternatives for the proposed anesthesia with the patient or authorized representative who has indicated his/her understanding and acceptance.   Dental advisory given  Plan Discussed with: Anesthesiologist, Surgeon and CRNA  Anesthesia Plan Comments:        Anesthesia Quick Evaluation

## 2014-07-29 NOTE — Progress Notes (Addendum)
Patient ID: [name], male   DOB: [birth year], 64 y.o.   MRN: [id number] 1 Day Post-Op  Subjective: [name] is doing well this morning.   He has no further flank pain and denies irritative voiding symtoms.  He has not voided this morning but is oliguric from his ESRD.   He is in dialysis this morning.  He is POD #1 from ureteroscopy and stenting for multiple soft bladder and left distal ureteral stones.  ROS:  Review of Systems  Constitutional: Negative for fever.  Gastrointestinal: Negative for nausea.    Anti-infectives: Anti-infectives    Start     Dose/Rate Route Frequency Ordered Stop   07/29/14 0600  cefUROXime (ZINACEF) 1.5 g in dextrose 5 % 50 mL IVPB  Status:  Discontinued     1.5 g 100 mL/hr over 30 Minutes Intravenous On call to O.R. 07/28/14 2012 07/28/14 2018      Current Facility-Administered Medications  Medication Dose Route Frequency Provider Last Rate Last Dose  . aspirin chewable tablet 81 mg  81 mg Oral Daily Theodis Blaze, MD   81 mg at 07/28/14 2259  . atorvastatin (LIPITOR) tablet 10 mg  10 mg Oral Daily Theodis Blaze, MD   10 mg at 07/28/14 1656  . calcium acetate (PHOSLO) capsule 2,001 mg  2,001 mg Oral TID WC Theodis Blaze, MD   2,001 mg at 07/28/14 1700  . [START ON 07/30/2014] ferric gluconate (NULECIT) 62.5 mg in sodium chloride 0.9 % 100 mL IVPB  62.5 mg Intravenous Q Wed-HD Ernest Haber, PA-C      . heparin injection 5,000 Units  5,000 Units Subcutaneous 3 times per day Theodis Blaze, MD   5,000 Units at 07/28/14 1445  . insulin aspart (novoLOG) injection 0-9 Units  0-9 Units Subcutaneous TID WC Theodis Blaze, MD   0 Units at 07/28/14 1200  . midodrine (PROAMATINE) tablet 10 mg  10 mg Oral BID Theodis Blaze, MD   10 mg at 07/28/14 2259  . morphine 2 MG/ML injection 1 mg  1 mg Intravenous Q4H PRN Theodis Blaze, MD   1 mg at 07/28/14 1326  . multivitamin (RENA-VIT) tablet 1 tablet  1 tablet Oral QHS Theodis Blaze, MD   1 tablet at 07/28/14 2259  .  ondansetron (ZOFRAN) tablet 4 mg  4 mg Oral Q6H PRN Theodis Blaze, MD       Or  . ondansetron [facility]) injection 4 mg  4 mg Intravenous Q6H PRN Theodis Blaze, MD      . oxyCODONE (Oxy IR/ROXICODONE) immediate release tablet 5 mg  5 mg Oral Q4H PRN Theodis Blaze, MD      . sevelamer carbonate (RENVELA) tablet 1,600 mg  1,600 mg Oral TID WC Theodis Blaze, MD   1,600 mg at 07/28/14 1700  . sodium chloride 0.9 % injection 3 mL  3 mL Intravenous Q12H Theodis Blaze, MD   3 mL at 07/28/14 2300  . tamsulosin (FLOMAX) capsule 0.4 mg  0.4 mg Oral Daily Theodis Blaze, MD   0.4 mg at 07/28/14 1656   Facility-Administered Medications Ordered in Other Encounters  Medication Dose Route Frequency Provider Last Rate Last Dose  . Chlorhexidine Gluconate Cloth 2 % PADS 6 each  6 each Topical Once [name], MD         Objective: Vital signs in last 24 hours: Temp:  [97.7 F (36.5 C)-98.2 F (36.8 C)] 98.2 F (  36.8 C) (04/26 0600) Pulse Rate:  [65-79] 65 (04/26 0600) Resp:  [9-23] 17 (04/26 0600) BP: (117-187)/(63-94) 117/63 mmHg (04/26 0600) SpO2:  [96 %-100 %] 99 % (04/26 0600)  Intake/Output from previous day: 04/25 0701 - 04/26 0700 In: 893 [P.O.:480; I.V.:413] Out: 300 [Urine:300] Intake/Output this shift:     Physical Exam  Constitutional: He is well-developed, well-nourished, and in no distress.  Abdominal: Soft. He exhibits no distension. There is no tenderness.  Vitals reviewed.   Lab Results:   Recent Labs  07/28/14 0645 07/28/14 0654 07/28/14 1700  WBC 5.7  --  5.9  HGB 12.5* 12.2* 11.7*  HCT 36.6* 36.0* 34.3*  PLT 97*  --  95*   BMET  Recent Labs  07/28/14 0645 07/28/14 0654 07/28/14 1700  NA 138 137  --   K 5.5* 5.4*  --   CL 94* 99  --   CO2 24  --   --   GLUCOSE 208* 210*  --   BUN 144* 135*  --   CREATININE 9.83* 9.10* 10.32*  CALCIUM 10.2  --   --    PT/INR  Recent Labs  07/28/14 0645  LABPROT 13.1  INR 0.98   ABG No results for  input(s): PHART, HCO3 in the last 72 hours.  Invalid input(s): PCO2, PO2  Studies/Results: Ct Abdomen Pelvis W Contrast  07/28/2014   CLINICAL DATA:  Nausea and vomiting beginning today. Renal failure patient.  EXAM: CT ABDOMEN AND PELVIS WITH CONTRAST  TECHNIQUE: Multidetector CT imaging of the abdomen and pelvis was performed using the standard protocol following bolus administration of intravenous contrast.  CONTRAST:  100 mL OMNIPAQUE IOHEXOL 300 MG/ML  SOLN  COMPARISON:  MRI abdomen 01/03/2012. CT abdomen and pelvis 02/03/2003.  FINDINGS: Dependent atelectasis is seen in the lung bases, more notable on the right. No pleural or pericardial effusion. Coronary artery disease is noted. The patient is status post CABG.  The gallbladder has been removed. Mild intrahepatic biliary ductal dilatation is likely related to patient's age and gallbladder removal. The spleen adrenal glands are unremarkable. The pancreas is atrophic but otherwise normal in appearance.  There is mild left hydronephrosis and dilatation of the left ureter. Stranding is seen about the mid and distal ureter. No stone or other focally obstructing lesion is visualized on this examination. Cortical defect and calcification in the midpole of the left kidney posteriorly is compatible with prior radiofrequency ablation. An exophytic lesion off the upper pole of the right kidney measures 3.6 x 3.6 cm compared to 3.7 x 3.8 cm on the prior MRI and is consistent with prior radiofrequency ablation. A new nodule is seen off the posterior and lateral margin of the RFA site measuring 0.9 cm in diameter and demonstrating Hounsfield units of 58.3 on early phase imaging and 36.1 on delayed imaging.  A large stool ball seen the rectosigmoid colon the colon is otherwise unremarkable. There is no small bowel obstruction. No lymphadenopathy or fluid is identified.  No lytic or sclerotic bony lesion is seen. The patient is status post fixation of remote left hip  fracture. Degenerative disease present about both hips.  IMPRESSION: New mild left hydronephrosis with stranding about the mid to distal left ureter. Cause for this finding is not identified. It could be secondary to recent stone passage, stricture of the ureter or urothelial neoplasm.  New nodule at the RFA site on the right could be due to tumor recurrence. Neurologic consultation is recommended for evaluation of this  finding in the patient's hydronephrosis described above.   Electronically Signed   By: Inge Rise M.D.   On: 07/28/2014 09:16   4/25 labs reviewed.   Assessment: s/p Procedure(s): CYSTOSCOPY WITH RETROGRADE PYELOGRAM, URETERAL BALLOON DILITATION, URETEROSCOPY AND LEFT STENT PLACEMENT  He is doing well postop and tolerating the stent.   Plan: He will need f/u in my office for stent removal in 2-3 weeks. Please reconsult prn.       LOS: 1 day    Valencia Kassa J 07/29/2014   I have contacted [name] and he wants to see Ronin in his clinic after he is discharged to discuss the right renal nodule.

## 2014-07-29 NOTE — Op Note (Signed)
[name], [name]               ACCOUNT NO.:  [id number]  MEDICAL RECORD NO.:  [id number]  LOCATION:  6E30C                        FACILITY:  [facility]  PHYSICIAN:  Marshall Cork. [name], M.D.    DATE OF BIRTH:  Aug 02, 1950  DATE OF PROCEDURE:  07/28/2014 DATE OF DISCHARGE:                              OPERATIVE REPORT   Patient of Dr. Theodis Blaze  PROCEDURES: 1. Cystoscopy with removal of multiple small soft bladder stones. 2. Left retrograde pyelogram with interpretation. 3. Dilation of left distal ureteral stricture. 4. Left ureteroscopic stone extraction. 5. Placement of left ureteral stent.  PREOPERATIVE DIAGNOSIS:  Left distal ureteral obstruction without obvious stone seen on x-ray.  POSTOPERATIVE DIAGNOSES: 1. Multiple small soft bladder stones. 2. Left distal ureteral strictures. 3. Soft left distal ureteral stone.  SURGEON:  Marshall Cork. [name], M.D.  ANESTHESIA:  General.  SPECIMEN:  Stone material from the bladder.  DRAINS:  A 6-French 26-cm double-J stent.  BLOOD LOSS:  Minimal.  COMPLICATIONS:  None.  INDICATIONS:  [name] is a 64 year old white male, who I have seen for multiple conditions including prostate cancer treated with brachytherapy and bilateral renal masses treated with RFA.  He presented to the emergency room today with the acute onset earlier today of left flank pain and was found on CT to have left hydronephrosis with distal ureteral obstruction.  There was some low-density material apparent in the distal ureter, but it did not appear consistent with a stone.  It was felt that cystoscopy with retrograde pyelography, possible stent, possibly ureteroscopy was indicated because of his persistent pain.  FINDINGS OF PROCEDURE:  He had been on Ancef per Dr. Doyle Askew.  He was taken to the operating room where general anesthetic was induced.  He was placed in lithotomy position.  His perineum and genitalia were prepped with Betadine solution and draped in  usual sterile fashion.  Cystoscopy was performed using a 22-French scope and 30-degree lens. Examination revealed a normal urethra.  The external sphincter was intact.  The prostatic urethra was approximately 2-3 cm in length with some bilobar hyperplasia, but minimal obstruction.  There were some radiation changes.  Examination of the bladder demonstrated moderate trabeculation.  No tumors were noted.  There were several very small 1-3 mm stone like structures in the bladder; however, they were free- floating and upon evacuation, were quite soft.  They did not appear to be consistent with a blood clot, but more some sort of soft matrix-like material, but very dark.  After these were evacuated from the bladder, they were probably 20 or 30 or more of them, a left retrograde pyelogram was performed.  The right ureteral orifice was unremarkable.  The left ureteral orifice was slightly heaped up.  A 5-French open-ended catheter was inserted in left ureteral orifice and contrast was instilled.  This revealed some J- hooking of the ureter with marked dilation of the distal ureter approximately 2 cm and narrowing of the distal intramural ureter.  I was unable to inject contrast proximal to this just because of difficulty keeping the tip of the catheter in the meatus.  There did appear to be a little filling defect in the  dilated section.  At this point, a guidewire was passed through the open-ended catheter and negotiated back out through the ureter to the kidney.  An opening catheter was advanced over the wire into the proximal ureter.  The wire was removed and there was a brisk hydronephrotic drip of very light pink urine.  At this point, the guidewire was reinserted.  The open-ended catheter was removed.  An attempt was made to dilate the ureter with a 38-cm 12- French introducer sheath inner core.  This was unsuccessful due to stricture of the distal ureter.  I then used a 4-cm  15-French balloon catheter, which did effectively dilate the urethral meatus at 24 atmospheres.  The balloon was removed along with the cystoscope and a short semi-rigid ureteroscope was inserted alongside the wire.  A couple of additional small black "stones" were noted in the distal ureter consistent of what I had seen in the bladder.  A NCompass basket was used to remove these stones and once again, they were quite soft and more like a mature clot or some sort of matrix than a stone.  At this point, the ureteroscope was removed.  The cystoscope was reinserted over the wire and a 6-French 26-cm double-J stent was inserted to the kidney under fluoroscopic guidance without difficulty. The wire was removed leaving good coil in the kidney and good coil in the bladder.  The bladder was then drained.  The cystoscope was removed. The patient was taken down from lithotomy position.  His anesthetic was reversed.  He was moved to recovery room in stable condition.  There were no complications.     Marshall Cork. [name], M.D.     JJW/MEDQ  D:  07/28/2014  T:  07/29/2014  Job:  [id number]

## 2014-07-29 NOTE — Discharge Summary (Signed)
Physician Discharge Summary  [name] HUD:[id number] DOB: 21-Mar-1951 DOA: 07/28/2014  PCP: [name], MD  Admit date: 07/28/2014 Discharge date: 07/29/2014  Time spent: >30 minutes  Recommendations for Outpatient Follow-up:  Please repeat CBC in order to follow platelets trending Patient to follow-up with [name] for further treatment/position on nodule seen in right kidney (presumed to be tumor recurrence)  Discharge Diagnoses:  Principal Problem:   Hydronephrosis Active Problems:   CAD, s/p CABG 2013   Peripheral vascular disease   ESRD (end stage renal disease) on dialysis   Thrombocytopenia Hyperlipidemia Right RFA nodule; with concern for tumor recurrence Diabetes mellitus type 2 with multiple complications  Discharge Condition: Stable and improved. Patient discharged home with instructions to follow with urologist in 2-3 weeks; follow-up with vascular surgery in 2 weeks and with his PCP in 3 weeks. Patient will resume hemodialysis on Monday, Wednesday and Friday after discharge.  Diet recommendation: Low carbohydrates and low sodium diet  Filed Weights   07/28/14 0641 07/29/14 0715 07/29/14 1125  Weight: 92.987 kg (205 lb) 101 kg (222 lb 10.6 oz) 98.4 kg (216 lb 14.9 oz)    History of present illness:  64 year old male with multiple and complex medical conditions including end-stage renal disease on HD MWF, HTN, HLD, diabetes type 2 with complications of ESRD and peripheral vascular disease and s/p right foot amputation, blindness and s/p bilateral enucleation, history of renal and prostate cancer (s/p seeds implant), presented to [name] ED with main concern of several hours duration of progressively worsening LLQ abd pain, sharp, intermittent and occasionally but not consistently radiating to the suprapubic area, worse with eating and with no specific alleviating factors, associated with nausea and non bloody vomiting, no similar events in the past. Pt denies fevers,  chills, no chest pain or shortness of breath. Pt denies noticing blood in urine or stool, no focal neurological symptoms.   Hospital Course:  Acute hydronephrosis, mid to distal left ureter stranding  -Appreciate assistance and care provided by the urologist service -Patient is status post ureteroscopy with stenting and stones removal -plan is to follow with urologist in 2 weeks as an outpatient -symptoms completely resolved prior to discharge  Nausea and vomiting - Secondary to wanted to postpone and hydronephrosis. Symptoms dramatically improve/resolve after order to ureteroscopy with stent placement and stone removal. -Diet and medications tolerated prior to discharge -Will follow with PCP in 3 weeks, and in 2 weeks with his urologist.  Peripheral vascular disease, s/p right foot amputation - continue aspirin 81 mg PO   ESRD (end stage renal disease) on dialysis - Patient to continue outpatient hemodialysis on Monday, Wednesdays and Fridays  Anemia of chronic disease, ESRD - Hg stable and at baseline - Continue Aranesp and IV iron as per renal discretion/recommendations  Hyperkalemia -addressed with HD session -Electrolytes to continued to be followed the patient's dialysis treatments in order to adjust his K's bath  Thrombocytopenia - chronic in etiology, no signs of active bleeding - Follow platelets trending in outpatient setting  DM type II with complications of blindness, ESRD, PVD, amputation - A1c 5.6 - Continue low carbohydrate diet -Patient using sliding scale insulin at home  CAD, s/p CABG 2013 - continue aspirin and statins -no COP or SOB  Hyperlipidemia: Continue statins  Nodule at the RFA site on the right -suspected to be secondary to tumor recurrence -plan is for outpatient follow up with [name]  Procedures:  Medication of completing branches of the left arm of  left arm AVF  Ureteroscopy and stenting for multiple soft bladder and left distal  ureteral stones  Consultations:  Renal service  Urology  Vascular surgery  Discharge Exam: Filed Vitals:   07/29/14 1517  BP: 104/60  Pulse: 64  Temp: 97.6 F (36.4 C)  Resp: 18    General: In no acute distress; patient denies any further episode of pain, nausea  and vomiting; patient is afebrile Cardiovascular: S1 and S2, RRR, no gallops, no murmurs, no rubs Respiratory: Good air movement bilaterally, no wheezing, no crackles, scattered rhonchi Abdomen, soft, positive bowel sounds, no distention, no tenderness, no guarding Extremities: Trace of edema bilaterally, status post right foot amputation; left upper extremity fistula with good thrill Neuro: Nonfocal  Discharge Instructions   Discharge Instructions    Diet - low sodium heart healthy    Complete by:  As directed      Discharge instructions    Complete by:  As directed   Take medications as prescribed Follow with vascular surgery in 2 weeks Arrange follow up with PCP in 3 weeks after discharge Follow with urology as instructed Maintain adequate hydration Follow low sodium/heart healthy diet          Current Discharge Medication List    CONTINUE these medications which have NOT CHANGED   Details  aspirin 81 MG tablet Take 81 mg by mouth daily.    atorvastatin (LIPITOR) 10 MG tablet TAKE 1 TABLET BY MOUTH EVERY DAY Qty: 30 tablet, Refills: 6    b complex-vitamin c-folic acid (NEPHRO-VITE) 0.8 MG TABS Take 0.8 mg by mouth at bedtime.    calcium acetate (PHOSLO) 667 MG capsule Take 2,001 mg by mouth 3 (three) times daily with meals.     darbepoetin (ARANESP) 25 MCG/0.42ML SOLN injection Inject 0.42 mLs (25 mcg total) into the vein every Wednesday with hemodialysis. Qty: 14.28 mL, Refills: 1    midodrine (PROAMATINE) 10 MG tablet Take 10 mg by mouth 2 (two) times daily.    sevelamer carbonate (RENVELA) 800 MG tablet Take 1,600 mg by mouth 3 (three) times daily with meals.     acetaminophen (TYLENOL)  325 MG tablet Take 2 tablets (650 mg total) by mouth every 6 (six) hours as needed for mild pain (or Fever >/= 101).    alum & mag hydroxide-simeth (MAALOX/MYLANTA) 200-200-20 MG/5ML suspension Take 30 mLs by mouth every 4 (four) hours as needed for indigestion or heartburn. Qty: 355 mL, Refills: 0    docusate sodium (COLACE) 100 MG capsule Take 1 capsule (100 mg total) by mouth 2 (two) times daily. Continue this while taking narcotics to help with bowel movements Qty: 30 capsule, Refills: 1    HYDROcodone-acetaminophen (NORCO) 5-325 MG per tablet Take 1-2 tablets by mouth every 4 (four) hours as needed for moderate pain. Qty: 90 tablet, Refills: 0    insulin aspart (NOVOLOG) 100 UNIT/ML injection Inject 0-9 Units into the skin 3 (three) times daily with meals. Qty: 10 mL, Refills: 11    ondansetron (ZOFRAN) 4 MG tablet Take 1 tablet (4 mg total) by mouth every 8 (eight) hours as needed for nausea. Qty: 60 tablet, Refills: 0    oxyCODONE-acetaminophen (PERCOCET/ROXICET) 5-325 MG per tablet Take 1-2 tablets by mouth every 4 (four) hours as needed for severe pain. Qty: 30 tablet, Refills: 0    polyethylene glycol (MIRALAX / GLYCOLAX) packet Take 17 g by mouth daily as needed for mild constipation. Qty: 14 each, Refills: 0    zolpidem (AMBIEN) 5  MG tablet Take 1 tablet (5 mg total) by mouth at bedtime as needed and may repeat dose one time if needed for sleep. Qty: 30 tablet, Refills: 0      STOP taking these medications     aspirin EC 325 MG EC tablet      aspirin EC 325 MG tablet        Allergies  Allergen Reactions  . Codeine Other (See Comments)    Makes patient incoherent. STATES MAKES HIM COMATOSE  . Tape Other (See Comments)    Plastic tape tears skin off, please use paper tape instead.   Follow-up Information    Follow up with Malka So, MD.   Specialty:  Urology   Why:  Please call for a f/u appt for 2-3 weeks post discharge.    Contact information:   [address] [place] [address] [phone]       Follow up with Azzie Roup, MD.   Specialty:  Interventional Radiology   Why:  Call for follow up appt with [name] for a discussion of the right renal nodule   Contact information:   [address] [place] [address] [phone]       Follow up with [name], MD. Schedule an appointment as soon as possible for a visit in 3 weeks.   Specialty:  Internal Medicine   Contact information:   [address] [place] [address] [phone]       The results of significant diagnostics from this hospitalization (including imaging, microbiology, ancillary and laboratory) are listed below for reference.    Significant Diagnostic Studies: Ct Abdomen Pelvis W Contrast  07/28/2014   CLINICAL DATA:  Nausea and vomiting beginning today. Renal failure patient.  EXAM: CT ABDOMEN AND PELVIS WITH CONTRAST  TECHNIQUE: Multidetector CT imaging of the abdomen and pelvis was performed using the standard protocol following bolus administration of intravenous contrast.  CONTRAST:  100 mL OMNIPAQUE IOHEXOL 300 MG/ML  SOLN  COMPARISON:  MRI abdomen 01/03/2012. CT abdomen and pelvis 02/03/2003.  FINDINGS: Dependent atelectasis is seen in the lung bases, more notable on the right. No pleural or pericardial effusion. Coronary artery disease is noted. The patient is status post CABG.  The gallbladder has been removed. Mild intrahepatic biliary ductal dilatation is likely related to patient's age and gallbladder removal. The spleen adrenal glands are unremarkable. The pancreas is atrophic but otherwise normal in appearance.  There is mild left hydronephrosis and dilatation of the left ureter. Stranding is seen about the mid and distal ureter. No stone or other focally obstructing lesion is visualized on this examination. Cortical defect and calcification in the midpole of the left kidney posteriorly is compatible with prior radiofrequency  ablation. An exophytic lesion off the upper pole of the right kidney measures 3.6 x 3.6 cm compared to 3.7 x 3.8 cm on the prior MRI and is consistent with prior radiofrequency ablation. A new nodule is seen off the posterior and lateral margin of the RFA site measuring 0.9 cm in diameter and demonstrating Hounsfield units of 58.3 on early phase imaging and 36.1 on delayed imaging.  A large stool ball seen the rectosigmoid colon the colon is otherwise unremarkable. There is no small bowel obstruction. No lymphadenopathy or fluid is identified.  No lytic or sclerotic bony lesion is seen. The patient is status post fixation of remote left hip fracture. Degenerative disease present about both hips.  IMPRESSION: New mild left hydronephrosis with stranding  about the mid to distal left ureter. Cause for this finding is not identified. It could be secondary to recent stone passage, stricture of the ureter or urothelial neoplasm.  New nodule at the RFA site on the right could be due to tumor recurrence. Neurologic consultation is recommended for evaluation of this finding in the patient's hydronephrosis described above.   Electronically Signed   By: Inge Rise M.D.   On: 07/28/2014 09:16   Ir Shuntogram/ Fistulagram Left Mod Sed  07/17/2014   INDICATION: 64 year old male with a history renal failure. Left upper extremity radiocephalic fistula has been having decreased flow rates. He has been referred for evaluation.  EXAM: IR LEFT SHUNTOGRAM/FISTULAGRAM  COMPARISON:  05/09/2013  MEDICATIONS: None.  CONTRAST:  69m OMNIPAQUE IOHEXOL 300 MG/ML  SOLN  ANESTHESIA/SEDATION: Sedation time  Zero minutes  FLUOROSCOPY TIME:  25 seconds, 0 minutes  COMPLICATIONS: None  PROCEDURE: Informed written consent was obtained from the patient and the patient's family after a discussion of the risk, benefits and alternatives to treatment. Questions regarding the procedure were encouraged and answered. A timeout was performed prior to  the initiation of the procedure.  The patient's left upper extremity radiocephalic fistula was accessed with a micropuncture kit in a centrally directed manner.  Left upper extremity fistulogram was performed, including reflux images to evaluate the arterial venous anastomosis.  At the conclusion, the micro sheath was removed and manual pressure was used for hemostasis.  Patient tolerated the procedure well and remained hemodynamically stable throughout.  No complications were encountered and no significant blood loss was encountered.  FINDINGS: Widely patent venous outflow through the primary venous channel through the cephalic arch. No stenosis or occlusion identified. No stenosis at the cephalic arch inflow.  Arterial anastomosis is widely patent.  Multiple competing collateral venous outflow veins originate in the antecubital region, draining through the brachial vein towards the central venous outflow. Temporarily, though the contrast flow through the competing outflow veins was delayed by only a few moments through the venous outflow, with estimated 40% of the venous outflow through these competing channels.  IMPRESSION: Status post left upper extremity radiocephalic fistulogram.  No evidence of significant stenosis, occlusion, of the primary cephalic venous outflow. The arterial venous anastomosis is widely patent.  There are multiple collateral draining veins originating from the antecubital region, draining through the brachial vein centrally.  Signed,  JDulcy Fanny WEarleen Newport DO  [facility]  [facility]  ACCESS: These results were called by telephone at the time of interpretation on 07/17/2014 at 9:19 am to Dr. JJeneen RinksDETERDING , who verbally acknowledged these results. Further evaluation by a vascular surgery would be recommended for potential competing venous outflow channel ligation.  This radiocephalic fistula remains amenable to further intervention.    Electronically Signed   By: JCorrie MckusickD.O.   On: 07/17/2014 09:19    Microbiology: Recent Results (from the past 240 hour(s))  Surgical pcr screen     Status: None   Collection Time: 07/28/14  3:22 PM  Result Value Ref Range Status   MRSA, PCR NEGATIVE NEGATIVE Final   Staphylococcus aureus NEGATIVE NEGATIVE Final    Comment:        The Xpert SA Assay (FDA approved for NASAL specimens in patients over 217years of age), is one component of a comprehensive surveillance program.  Test performance has been validated by [facility] patients greater than or equal to 145year old. It is not intended to  diagnose infection nor to guide or monitor treatment.      Labs: Basic Metabolic Panel:  Recent Labs Lab 07/28/14 0645 07/28/14 0654 07/28/14 1700 07/29/14 0803  NA 138 137  --  139  K 5.5* 5.4*  --  6.1*  CL 94* 99  --  99  CO2 24  --   --  23  GLUCOSE 208* 210*  --  98  BUN 144* 135*  --  152*  CREATININE 9.83* 9.10* 10.32* 10.91*  CALCIUM 10.2  --   --  9.3  PHOS  --   --   --  5.8*   Liver Function Tests:  Recent Labs Lab 07/28/14 0645 07/29/14 0803  AST 12  --   ALT 13  --   ALKPHOS 77  --   BILITOT 0.7  --   PROT 7.3  --   ALBUMIN 4.0 3.2*    Recent Labs Lab 07/28/14 0645  LIPASE 76*   CBC:  Recent Labs Lab 07/28/14 0645 07/28/14 0654 07/28/14 1700 07/29/14 0802  WBC 5.7  --  5.9 4.5  NEUTROABS 4.7  --   --   --   HGB 12.5* 12.2* 11.7* 10.5*  HCT 36.6* 36.0* 34.3* 31.0*  MCV 98.1  --  98.0 99.0  PLT 97*  --  95* 87*   CBG:  Recent Labs Lab 07/28/14 1652 07/28/14 1926 07/28/14 2039 07/29/14 1151 07/29/14 1517  GLUCAP 106* 106* 111* 81 86    Signed:  Barton Dubois  [facility] 07/29/2014, 4:55 PM

## 2014-07-29 NOTE — Progress Notes (Signed)
Patient ID: [name], male   DOB: 1951-02-10, 64 y.o.   MRN: [id number]   Came to see patient for post op visit but he was out of the room

## 2014-07-29 NOTE — H&P (View-Only) (Signed)
Patient is a 64 year old male referred by Dr. Jimmy Footman for possibility of side branch ligation for his left arm AV fistula. The patient states he had the fistula placed 4 years ago. It is a left radiocephalic AV fistula. He states that the flow has decreased over the last 4-6 weeks. He recently had a fistulogram at [facility] which suggested side branches may be diminishing blood flow through the fistula. There was no narrowing in the native cephalic vein or in the arterial anastomosis. The patient denies any steal symptoms. He is right-handed.  Physical exam:  Filed Vitals:   07/24/14 1009  BP: 100/63  Pulse: 64  Height: 6\' 4"  (1.93 m)  Weight: 221 lb (100.245 kg)  SpO2: 100%    Left upper extremity: Palpable thrill left arm AV fistula fistula is palpable throughout the forearm. There are some collateral branches near the antecubital region and parallel to the fistula in the left forearm. The flow in the fistula does increase slightly with compression of these.  Data: I reviewed the images of the patient's fistulogram today. There are several large branches near the antecubital level. However, this is most likely normal anatomic variant with the basilic system and a normal antecubital system. The cephalic vein is patent throughout its course with minimal side branches in the upper arm and no significant side branches in the left forearm. This is widely patent.  Assessment: Poor flow left radiocephalic AV fistula with no obvious anastomotic narrowing. Although the patient does have some patent side branches, these are mostly near the antecubital level. I do not believe that this necessarily is the reason for low flow in the fistula. However the only other option to increase flow would be to consider a new AV fistula. Since that is the case, we will take him to the operating room and ligate all side branches in the left forearm AV fistula to see if this improves flow. If not we will need to  consider a new access. All this was discussed with the patient and his wife today. Ligation of side branches of left arm AV fistula scheduled for 07/29/2014. I discussed with the patient today there is a 50% chance that this will improve flow in his fistula.  Ruta Hinds, MD [facility] Office: [phone] Pager: [phone]

## 2014-07-29 NOTE — Interval H&P Note (Signed)
History and Physical Interval Note:  07/29/2014 11:56 AM  [name]  has presented today for surgery, with the diagnosis of End Stage Renal Disease  N18.6 Competing Branch Left Arm AVF  The various methods of treatment have been discussed with the patient and family. After consideration of risks, benefits and other options for treatment, the patient has consented to  Procedure(s): LIGATION OF COMPETING BRANCHES OF LEFT ARM ARTERIOVENOUS FISTULA (Left) as a surgical intervention .  The patient's history has been reviewed, patient examined, no change in status, stable for surgery.  I have reviewed the patient's chart and labs.  Questions were answered to the patient's satisfaction.     Remmy Crass E

## 2014-07-30 ENCOUNTER — Other Ambulatory Visit (HOSPITAL_COMMUNITY): Payer: Self-pay | Admitting: Interventional Radiology

## 2014-07-30 ENCOUNTER — Telehealth: Payer: Self-pay | Admitting: Vascular Surgery

## 2014-07-30 ENCOUNTER — Encounter (HOSPITAL_COMMUNITY): Payer: Self-pay | Admitting: Vascular Surgery

## 2014-07-30 DIAGNOSIS — C641 Malignant neoplasm of right kidney, except renal pelvis: Secondary | ICD-10-CM

## 2014-07-30 NOTE — Telephone Encounter (Signed)
Spoke with pts wife, Velva Harman, to schedule, dpm

## 2014-07-30 NOTE — Telephone Encounter (Signed)
-----   Message from Mena Goes, RN sent at 07/29/2014  2:42 PM EDT ----- Regarding: Schedule   ----- Message -----    From: [name], MD    Sent: 07/29/2014   2:25 PM      To: Vvs Charge Pool  Needs follow up appt in 2 weeks for suture removal  Ruta Hinds

## 2014-07-31 ENCOUNTER — Ambulatory Visit: Payer: Self-pay | Admitting: Cardiology

## 2014-07-31 ENCOUNTER — Ambulatory Visit (INDEPENDENT_AMBULATORY_CARE_PROVIDER_SITE_OTHER): Payer: Medicare Other | Admitting: Cardiology

## 2014-07-31 ENCOUNTER — Encounter: Payer: Self-pay | Admitting: Cardiology

## 2014-07-31 VITALS — BP 102/64 | HR 64 | Ht 76.0 in | Wt 218.7 lb

## 2014-07-31 DIAGNOSIS — I2510 Atherosclerotic heart disease of native coronary artery without angina pectoris: Secondary | ICD-10-CM | POA: Diagnosis not present

## 2014-07-31 NOTE — Progress Notes (Signed)
Cardiology Office Note   Date:  07/31/2014   ID:  [name], DOB Apr 22, 1950, MRN [id number]  PCP:  [name], MD  Cardiologist:   [name], MD   Chief Complaint  Patient presents with  . Pre-op Exam  . Coronary Artery Disease      History of Present Illness: [name] is a 64 y.o. male who presents for the patient has a history of coronary artery disease with diffuse diabetic disease status post bypass. He is being considered for renal transplant at [facility].  The patient did have an evaluation recently with a well preserved ejection fraction. There were poor images apparently. He had a stress perfusion study with a small apical anterior and mid anterior reversible perfusion defect with artifact versus ischemia as the interpretation.  He actually is very active. He exercises routinely despite being completely blind. 8 years ago with diet and exercise he lost over 200 pounds. However, by that time he already had the ravages of diabetes. He still routinely exercises. With this he denies any cardiovascular symptoms. He has no chest pressure, neck or arm discomfort. He has no palpitations, presyncope or syncope. He has no weight gain or edema.  Of note he was recently hospitalized for hydronephrosis. I reviewed these records. There were no cardiac issues.  Past Medical History  Diagnosis Date  . CAD, NATIVE VESSEL 01/08/2008       . HYPERLIPIDEMIA-MIXED 01/08/2008  . OVERWEIGHT/OBESITY 01/08/2008    Lost 205 lbs through diet and exercise.    . Anemia   . Bruises easily   . Blind     both eyes removed   . Family history of breast cancer     mother  . Cellulitis late 1980's    "hospitalized; wrapped both legs; several times; no OR for this"  . Peripheral vascular disease   . Colon polyps   . HYPERTENSION 01/27/2009    BP low  . Hypotension   . Prostate cancer   . Pneumonia 2000;s X 1  . DM type 2 (diabetes mellitus, type 2)     no medications (07/28/2014)  .  Arthritis     "back, neck" (07/28/2014)  . ESRD (end stage renal disease) on dialysis     [facility]; Mon, Wed, Fri (07/28/2014)  . Renal insufficiency   . Renal cell carcinoma 2001 and 2003    "both kidneys"    Past Surgical History  Procedure Laterality Date  . Foot amputation Right ~ 2002    right;  partial; "infection"  . Colonoscopy  06/14/2011    Procedure: COLONOSCOPY;  Surgeon: [name], MD;  Location: [facility];  Service: Endoscopy;  Laterality: N/A;  . Hot hemostasis  06/14/2011    Procedure: HOT HEMOSTASIS (ARGON PLASMA COAGULATION/BICAP);  Surgeon: [name], MD;  Location: [facility];  Service: Endoscopy;  Laterality: N/A;  . Av fistula placement Left 02/2010    LFA  . Varicose vein surgery  mid 1980's     BLE; "knees down; both legs; 2 separate times"  . Radiofrequency ablation kidney  2010-2012    "twice; one on each side; for cancer"  . Coronary artery bypass graft  09/29/2011    Procedure: CORONARY ARTERY BYPASS GRAFTING (CABG);  Surgeon: [name], MD;  Location: [facility];  Service: Open Heart Surgery;  Laterality: N/A;  Coronary Artery Bypass Graft times two utilizing the left internal mammary artery and the right greater saphenous vein harvested endoscopically.  [name]  Colonoscopy N/A 07/24/2012    Procedure: COLONOSCOPY;  Surgeon: [name], MD;  Location: [facility];  Service: Endoscopy;  Laterality: N/A;  . Hip pinning,cannulated Left 12/31/2013    Procedure: CANNULATED HIP PINNING;  Surgeon: Renette Butters, MD;  Location: [facility];  Service: Orthopedics;  Laterality: Left;  Carm, FX Table, [employer]  . Left heart catheterization with coronary angiogram N/A 09/27/2011    Procedure: LEFT HEART CATHETERIZATION WITH CORONARY ANGIOGRAM;  Surgeon: [name], MD;  Location: [facility] CATH LAB;  Service: Cardiovascular;  Laterality: N/A;  . Colonoscopy with propofol N/A 05/13/2014    Procedure: COLONOSCOPY WITH PROPOFOL;  Surgeon: [name], MD;   Location: [facility];  Service: Endoscopy;  Laterality: N/A;  . Colonoscopy    . Enucleation  2003; 2006    bilateral; "diabetes; pain"  . Fracture surgery    . Cholecystectomy open  1992  . Insertion prostate radiation seed  03/2012  . Cystoscopy with retrograde pyelogram, ureteroscopy and stent placement Left 07/28/2014    Procedure: CYSTOSCOPY WITH RETROGRADE PYELOGRAM, URETERAL BALLOON DILITATION, URETEROSCOPY AND LEFT STENT PLACEMENT;  Surgeon: [name], MD;  Location: [facility];  Service: Urology;  Laterality: Left;  . Ligation of competing branches of arteriovenous fistula Left 07/29/2014    Procedure: LIGATION OF COMPETING BRANCHES OF LEFT ARM ARTERIOVENOUS FISTULA;  Surgeon: [name], MD;  Location: [facility];  Service: Vascular;  Laterality: Left;     Current Outpatient Prescriptions  Medication Sig Dispense Refill  . aspirin 81 MG tablet Take 81 mg by mouth daily.    [name] atorvastatin (LIPITOR) 10 MG tablet TAKE 1 TABLET BY MOUTH EVERY DAY 30 tablet 6  . b complex-vitamin c-folic acid (NEPHRO-VITE) 0.8 MG TABS Take 0.8 mg by mouth at bedtime.    . calcium acetate (PHOSLO) 667 MG capsule Take 2,001 mg by mouth 3 (three) times daily with meals.     . darbepoetin (ARANESP) 25 MCG/0.42ML SOLN injection Inject 0.42 mLs (25 mcg total) into the vein every Wednesday with hemodialysis. 14.28 mL 1  . midodrine (PROAMATINE) 10 MG tablet Take 10 mg by mouth 2 (two) times daily.    . sevelamer carbonate (RENVELA) 800 MG tablet Take 1,600 mg by mouth 3 (three) times daily with meals.      No current facility-administered medications for this visit.   Facility-Administered Medications Ordered in Other Visits  Medication Dose Route Frequency Provider Last Rate Last Dose  . Chlorhexidine Gluconate Cloth 2 % PADS 6 each  6 each Topical Once [name], MD        Allergies:   Codeine and Tape     ROS:  Please see the history of present illness.   Otherwise, review of systems are  positive for none.   All other systems are reviewed and negative.    PHYSICAL EXAM: VS:  BP 102/64 mmHg  Pulse 64  Ht 6\' 4"  (1.93 m)  Wt 218 lb 11.2 oz (99.202 kg)  BMI 26.63 kg/m2 , BMI Body mass index is 26.63 kg/(m^2). GENERAL:  Well appearing HEENT:  He is status post bilateral enucleation, oral mucosa unremarkable NECK:  No jugular venous distention, waveform within normal limits, carotid upstroke brisk and symmetric, no bruits, no thyromegaly LYMPHATICS:  No cervical, inguinal adenopathy LUNGS:  Clear to auscultation bilaterally BACK:  No CVA tenderness CHEST:  Unremarkable HEART:  PMI not displaced or sustained,S1 and S2 within normal limits, no S3, no S4, no clicks, no rubs, no  murmurs ABD:  Flat, positive bowel sounds normal in frequency in pitch, no bruits, no rebound, no guarding, no midline pulsatile mass, no hepatomegaly, no splenomegaly EXT:  2 plus pulses throughout, no edema, no cyanosis no clubbing, left arm fistula SKIN:  No rashes no nodules, easy bruising NEURO:  Cranial nerves II through XII grossly intact, motor grossly intact throughout PSYCH:  Cognitively intact, oriented to person place and time    EKG:  EKG is not ordered today. The ekg 07/27/13 demonstrated normal sinus rhythm, rate 67, axis within normal limits, intervals within normal limits, no acute ST-T wave changes.   Recent Labs: 07/28/2014: ALT 13 07/29/2014: BUN 152*; Creatinine 10.91*; Hemoglobin 10.5*; Platelets 87*; Potassium 6.1*; Sodium 139      Wt Readings from Last 3 Encounters:  07/31/14 218 lb 11.2 oz (99.202 kg)  07/29/14 216 lb 14.9 oz (98.4 kg)  07/24/14 221 lb (100.245 kg)      Other studies Reviewed: Additional studies/ records that were reviewed today include: Office records and hospital records and [facility] records. Review of the above records demonstrates:  Please see elsewhere in the note.     ASSESSMENT AND PLAN:  PREOP:  The patient had a low risk stress perfusion study.  He has a high functional level (greater than 5 METS).  He is going for a procedure that would be considered higher risk. He does have known diffuse disease and other comorbidities illnesses. However, according to ACC/AHA guidelines there would be no reason for invasive evaluation as he has no high-risk physical, stress test findings. He would be somewhat moderate risk similar because of the nature of the diffuse disease. He and his wife understand this. They would be willing to accept this low to moderate risk.   CAD:  He has no active symptoms. His blood pressure would not allow med titration. No change in therapy is indicated.   Current medicines are reviewed at length with the patient today.  The patient does not have concerns regarding medicines.  The following changes have been made:  no change  Labs/ tests ordered today include: None  No orders of the defined types were placed in this encounter.     Disposition:   FU with me in six months.    Signed, [name], MD  07/31/2014 5:42 PM    [facility]

## 2014-07-31 NOTE — Patient Instructions (Signed)
Your physician wants you to follow-up in:  6 months. You will receive a reminder letter in the mail two months in advance. If you don't receive a letter, please call our office to schedule the follow-up appointment.   

## 2014-08-05 ENCOUNTER — Ambulatory Visit
Admission: RE | Admit: 2014-08-05 | Discharge: 2014-08-05 | Disposition: A | Discharge status: Home / Self Care | Payer: Medicare Other | Source: Ambulatory Visit | Attending: Interventional Radiology | Admitting: Interventional Radiology

## 2014-08-05 DIAGNOSIS — C641 Malignant neoplasm of right kidney, except renal pelvis: Secondary | ICD-10-CM

## 2014-08-05 DIAGNOSIS — C649 Malignant neoplasm of unspecified kidney, except renal pelvis: Secondary | ICD-10-CM | POA: Insufficient documentation

## 2014-08-05 NOTE — Progress Notes (Signed)
Chief Complaint: Chief Complaint  Patient presents with  . Follow-up    s/p Cryo of Left Renal mass (04/14/10)/Right Renal mass (04/13/07)    History of Present Illness: [name] is a 64 y.o. male status post prior radiofrequency ablation of a right clear cell renal carcinoma in January, 2009 and cryoablation of a left renal clear cell carcinoma in December, 2011. He was recently hospitalized for symptomatic left hydronephrosis and ureteral obstruction requiring cystoscopic removal of bladder calculi, left ureteral calculi and dilatation and stenting of the left ureter by Dr. [name].  CT of the abdomen and pelvis on 07/28/2014 also demonstrated development of a small area of peripheral nodularity along the posterolateral margin of previous right renal radiofrequency ablation. This nodule was of relatively high density suggesting possible enhancement and measured approximately 8 mm in diameter. The right renal ablation defect was otherwise stable with no internal enhancement noted at the site of the original treated carcinoma. The left renal ablation site appeared stable with no evidence of recurrent enhancing left-sided carcinoma.   [name] is now feeling well and back to his baseline. He continues to be dialyzed 3 times a week and has been undergoing renal transplant evaluation at [facility]. He is on the transplant list and recently underwent cardiac clearance. He is awaiting a possible liver biopsy to evaluate for potential early cirrhosis of the liver.  Past Medical History  Diagnosis Date  . CAD, NATIVE VESSEL 01/08/2008       . HYPERLIPIDEMIA-MIXED 01/08/2008  . OVERWEIGHT/OBESITY 01/08/2008    Lost 205 lbs through diet and exercise.    . Anemia   . Bruises easily   . Blind     both eyes removed   . Family history of breast cancer     mother  . Cellulitis late 1980's    "hospitalized; wrapped both legs; several times; no OR for this"  . Peripheral vascular disease   . Colon polyps     . HYPERTENSION 01/27/2009    BP low  . Hypotension   . Prostate cancer   . Pneumonia 2000;s X 1  . DM type 2 (diabetes mellitus, type 2)     no medications (07/28/2014)  . Arthritis     "back, neck" (07/28/2014)  . ESRD (end stage renal disease) on dialysis     [employer]; Mon, Wed, Fri (07/28/2014)  . Renal insufficiency   . Renal cell carcinoma 2001 and 2003    "both kidneys"    Past Surgical History  Procedure Laterality Date  . Foot amputation Right ~ 2002    right;  partial; "infection"  . Colonoscopy  06/14/2011    Procedure: COLONOSCOPY;  Surgeon: [name], MD;  Location: [facility];  Service: Endoscopy;  Laterality: N/A;  . Hot hemostasis  06/14/2011    Procedure: HOT HEMOSTASIS (ARGON PLASMA COAGULATION/BICAP);  Surgeon: [name], MD;  Location: [facility];  Service: Endoscopy;  Laterality: N/A;  . Av fistula placement Left 02/2010    LFA  . Varicose vein surgery  mid 1980's     BLE; "knees down; both legs; 2 separate times"  . Radiofrequency ablation kidney  2010-2012    "twice; one on each side; for cancer"  . Coronary artery bypass graft  09/29/2011    Procedure: CORONARY ARTERY BYPASS GRAFTING (CABG);  Surgeon: [name], MD;  Location: [place];  Service: Open Heart Surgery;  Laterality: N/A;  Coronary Artery Bypass Graft times two utilizing the left  internal mammary artery and the right greater saphenous vein harvested endoscopically.  . Colonoscopy N/A 07/24/2012    Procedure: COLONOSCOPY;  Surgeon: [name], MD;  Location: [facility];  Service: Endoscopy;  Laterality: N/A;  . Hip pinning,cannulated Left 12/31/2013    Procedure: CANNULATED HIP PINNING;  Surgeon: Renette Butters, MD;  Location: [place];  Service: Orthopedics;  Laterality: Left;  Carm, FX Table, [employer]  . Left heart catheterization with coronary angiogram N/A 09/27/2011    Procedure: LEFT HEART CATHETERIZATION WITH CORONARY ANGIOGRAM;  Surgeon: [name], MD;   Location: [facility] CATH LAB;  Service: Cardiovascular;  Laterality: N/A;  . Colonoscopy with propofol N/A 05/13/2014    Procedure: COLONOSCOPY WITH PROPOFOL;  Surgeon: [name], MD;  Location: [facility];  Service: Endoscopy;  Laterality: N/A;  . Colonoscopy    . Enucleation  2003; 2006    bilateral; "diabetes; pain"  . Fracture surgery    . Cholecystectomy open  1992  . Insertion prostate radiation seed  03/2012  . Cystoscopy with retrograde pyelogram, ureteroscopy and stent placement Left 07/28/2014    Procedure: CYSTOSCOPY WITH RETROGRADE PYELOGRAM, URETERAL BALLOON DILITATION, URETEROSCOPY AND LEFT STENT PLACEMENT;  Surgeon: [name], MD;  Location: [facility];  Service: Urology;  Laterality: Left;  . Ligation of competing branches of arteriovenous fistula Left 07/29/2014    Procedure: LIGATION OF COMPETING BRANCHES OF LEFT ARM ARTERIOVENOUS FISTULA;  Surgeon: [name], MD;  Location: [place];  Service: Vascular;  Laterality: Left;    Allergies: Codeine and Tape  Medications: Prior to Admission medications   Medication Sig Start Date End Date Taking? Authorizing Provider  aspirin 81 MG tablet Take 81 mg by mouth daily.    Historical Provider, MD  atorvastatin (LIPITOR) 10 MG tablet TAKE 1 TABLET BY MOUTH EVERY DAY 05/13/14   Thayer Headings, MD  b complex-vitamin c-folic acid (NEPHRO-VITE) 0.8 MG TABS Take 0.8 mg by mouth at bedtime.    Historical Provider, MD  calcium acetate (PHOSLO) 667 MG capsule Take 2,001 mg by mouth 3 (three) times daily with meals.     Historical Provider, MD  darbepoetin (ARANESP) 25 MCG/0.42ML SOLN injection Inject 0.42 mLs (25 mcg total) into the vein every Wednesday with hemodialysis. 01/03/14   [name], MD  midodrine (PROAMATINE) 10 MG tablet Take 10 mg by mouth 2 (two) times daily.    Historical Provider, MD  sevelamer carbonate (RENVELA) 800 MG tablet Take 1,600 mg by mouth 3 (three) times daily with meals.  06/26/14   Historical Provider, MD     Family History  Problem Relation Age of Onset  . Cancer    . Coronary artery disease    . Kidney disease    . Breast cancer    . Ovarian cancer    . Lung cancer    . Diabetes    . Cancer Mother 68    breast, spine and ovarian  . Heart disease Mother   . Hyperlipidemia Mother   . Hypertension Mother   . Varicose Veins Mother   . Emphysema Father   . Cancer Father 76    kidney, prostate  . Heart disease Father   . Hyperlipidemia Father   . Hypertension Father   . Cancer Sister     ovarian  . Cancer Brother     lung  . Heart disease Brother   . Hyperlipidemia Brother   . Hypertension Brother   . Heart attack Brother   . Peripheral  vascular disease Brother   . Malignant hyperthermia Neg Hx     History   Social History  . Marital Status: Married    Spouse Name: N/A  . Number of Children: N/A  . Years of Education: N/A   Social History Main Topics  . Smoking status: Never Smoker   . Smokeless tobacco: Never Used  . Alcohol Use: No  . Drug Use: No  . Sexual Activity: No   Other Topics Concern  . Not on file   Social History Narrative    Review of Systems: A 12 point ROS discussed and pertinent positives are indicated in the HPI above.  All other systems are negative.  Review of Systems  Constitutional: Negative.   Respiratory: Negative.   Cardiovascular: Negative.   Gastrointestinal: Negative.   Genitourinary: Negative.   Musculoskeletal: Negative.   Neurological: Negative.     Vital Signs: BP 115/62 mmHg  Pulse 60  Temp(Src) 97.9 F (36.6 C) (Oral)  Resp 14  Ht 6' 4"  (1.93 m)  Wt 215 lb (97.523 kg)  BMI 26.18 kg/m2  SpO2 97%  Physical Exam  Constitutional: He is oriented to person, place, and time. No distress.  Abdominal: Soft. He exhibits no distension. There is no tenderness.  Neurological: He is alert and oriented to person, place, and time.  Nursing note and vitals reviewed.   Imaging: Ct Abdomen Pelvis W Contrast  07/28/2014    CLINICAL DATA:  Nausea and vomiting beginning today. Renal failure patient.  EXAM: CT ABDOMEN AND PELVIS WITH CONTRAST  TECHNIQUE: Multidetector CT imaging of the abdomen and pelvis was performed using the standard protocol following bolus administration of intravenous contrast.  CONTRAST:  100 mL OMNIPAQUE IOHEXOL 300 MG/ML  SOLN  COMPARISON:  MRI abdomen 01/03/2012. CT abdomen and pelvis 02/03/2003.  FINDINGS: Dependent atelectasis is seen in the lung bases, more notable on the right. No pleural or pericardial effusion. Coronary artery disease is noted. The patient is status post CABG.  The gallbladder has been removed. Mild intrahepatic biliary ductal dilatation is likely related to patient's age and gallbladder removal. The spleen adrenal glands are unremarkable. The pancreas is atrophic but otherwise normal in appearance.  There is mild left hydronephrosis and dilatation of the left ureter. Stranding is seen about the mid and distal ureter. No stone or other focally obstructing lesion is visualized on this examination. Cortical defect and calcification in the midpole of the left kidney posteriorly is compatible with prior radiofrequency ablation. An exophytic lesion off the upper pole of the right kidney measures 3.6 x 3.6 cm compared to 3.7 x 3.8 cm on the prior MRI and is consistent with prior radiofrequency ablation. A new nodule is seen off the posterior and lateral margin of the RFA site measuring 0.9 cm in diameter and demonstrating Hounsfield units of 58.3 on early phase imaging and 36.1 on delayed imaging.  A large stool ball seen the rectosigmoid colon the colon is otherwise unremarkable. There is no small bowel obstruction. No lymphadenopathy or fluid is identified.  No lytic or sclerotic bony lesion is seen. The patient is status post fixation of remote left hip fracture. Degenerative disease present about both hips.  IMPRESSION: New mild left hydronephrosis with stranding about the mid to distal  left ureter. Cause for this finding is not identified. It could be secondary to recent stone passage, stricture of the ureter or urothelial neoplasm.  New nodule at the RFA site on the right could be due to tumor recurrence.  Neurologic consultation is recommended for evaluation of this finding in the patient's hydronephrosis described above.   Electronically Signed   By: Inge Rise M.D.   On: 07/28/2014 09:16   Ir Shuntogram/ Fistulagram Left Mod Sed  07/17/2014   INDICATION: 64 year old male with a history renal failure. Left upper extremity radiocephalic fistula has been having decreased flow rates. He has been referred for evaluation.  EXAM: IR LEFT SHUNTOGRAM/FISTULAGRAM  COMPARISON:  05/09/2013  MEDICATIONS: None.  CONTRAST:  63m OMNIPAQUE IOHEXOL 300 MG/ML  SOLN  ANESTHESIA/SEDATION: Sedation time  Zero minutes  FLUOROSCOPY TIME:  25 seconds, 0 minutes  COMPLICATIONS: None  PROCEDURE: Informed written consent was obtained from the patient and the patient's family after a discussion of the risk, benefits and alternatives to treatment. Questions regarding the procedure were encouraged and answered. A timeout was performed prior to the initiation of the procedure.  The patient's left upper extremity radiocephalic fistula was accessed with a micropuncture kit in a centrally directed manner.  Left upper extremity fistulogram was performed, including reflux images to evaluate the arterial venous anastomosis.  At the conclusion, the micro sheath was removed and manual pressure was used for hemostasis.  Patient tolerated the procedure well and remained hemodynamically stable throughout.  No complications were encountered and no significant blood loss was encountered.  FINDINGS: Widely patent venous outflow through the primary venous channel through the cephalic arch. No stenosis or occlusion identified. No stenosis at the cephalic arch inflow.  Arterial anastomosis is widely patent.  Multiple competing  collateral venous outflow veins originate in the antecubital region, draining through the brachial vein towards the central venous outflow. Temporarily, though the contrast flow through the competing outflow veins was delayed by only a few moments through the venous outflow, with estimated 40% of the venous outflow through these competing channels.  IMPRESSION: Status post left upper extremity radiocephalic fistulogram.  No evidence of significant stenosis, occlusion, of the primary cephalic venous outflow. The arterial venous anastomosis is widely patent.  There are multiple collateral draining veins originating from the antecubital region, draining through the brachial vein centrally.  Signed,  JDulcy Fanny WEarleen Newport DO  [facility]  [facility]  ACCESS: These results were called by telephone at the time of interpretation on 07/17/2014 at 9:19 am to Dr. JJeneen RinksDETERDING , who verbally acknowledged these results. Further evaluation by a vascular surgery would be recommended for potential competing venous outflow channel ligation.  This radiocephalic fistula remains amenable to further intervention.   Electronically Signed   By: JCorrie MckusickD.O.   On: 07/17/2014 09:19    Labs:  CBC:  Recent Labs  01/04/14 0450 07/28/14 0645 07/28/14 0654 07/28/14 1700 07/29/14 0802  WBC 3.7* 5.7  --  5.9 4.5  HGB 9.4* 12.5* 12.2* 11.7* 10.5*  HCT 28.1* 36.6* 36.0* 34.3* 31.0*  PLT 89* 97*  --  95* 87*    COAGS:  Recent Labs  07/28/14 0645  INR 0.98    BMP:  Recent Labs  01/03/14 0407 01/04/14 0450 07/28/14 0645 07/28/14 0654 07/28/14 1700 07/29/14 0803  NA 138 141 138 137  --  139  K 4.6 4.2 5.5* 5.4*  --  6.1*  CL 94* 100 94* 99  --  99  CO2 27 28 24   --   --  23  GLUCOSE 100* 93 208* 210*  --  98  BUN 63* 41* 144* 135*  --  152*  CALCIUM 9.5 8.8 10.2  --   --  9.3  CREATININE 7.66* 5.43* 9.83* 9.10* 10.32* 10.91*  GFRNONAA 7* 10* 5*  --  5* 4*   GFRAA 8* 12* 6*  --  5* 5*    LIVER FUNCTION TESTS:  Recent Labs  07/28/14 0645 07/29/14 0803  BILITOT 0.7  --   AST 12  --   ALT 13  --   ALKPHOS 77  --   PROT 7.3  --   ALBUMIN 4.0 3.2*    TUMOR MARKERS: No results for input(s): AFPTM, CEA, CA199, CHROMGRNA in the last 8760 hours.  Assessment and Plan:  I met with [name] and his wife. We reviewed the recent CT on 4/25 as well as prior postprocedural MRI studies. I told them that it would be exceedingly rare to develop a tumor recurrence at the margin of prior radiofrequency ablation more than 7 years after treatment. Prior unenhanced MRI studies did not suggest any evidence of recurrence. I am currently unsure that this truly represents recurrent renal carcinoma. This could represent high density scar or a focus of hemorrhage. I have recommended a short-term follow-up CT of the abdomen with contrast in 2 months to determine if this nodularity grows. If there is growth over time, future biopsy and/or repeat ablation could be considered.  We will schedule a follow-up CT at the end of June. I will meet with the patient and his wife at that time to review CT findings.  SignedAletta Edouard T 08/05/2014, 10:20 AM   I spent a total of 15 minutes face to face in clinical consultation, greater than 50% of which was counseling/coordinating care post right renal ablation and follow up of new nodularity.

## 2014-08-13 ENCOUNTER — Encounter: Payer: Self-pay | Admitting: Vascular Surgery

## 2014-08-14 ENCOUNTER — Ambulatory Visit (INDEPENDENT_AMBULATORY_CARE_PROVIDER_SITE_OTHER): Payer: Self-pay | Admitting: Vascular Surgery

## 2014-08-14 ENCOUNTER — Encounter: Payer: Self-pay | Admitting: Vascular Surgery

## 2014-08-14 VITALS — BP 100/65 | HR 67 | Resp 18 | Ht 76.0 in | Wt 223.0 lb

## 2014-08-14 DIAGNOSIS — Z992 Dependence on renal dialysis: Secondary | ICD-10-CM

## 2014-08-14 DIAGNOSIS — N186 End stage renal disease: Secondary | ICD-10-CM

## 2014-08-14 NOTE — Progress Notes (Signed)
  POST OPERATIVE OFFICE NOTE    CC:  F/u for surgery  HPI:  This is a 64 y.o. male who is s/p left arm fistula side branch ligation for poorly functioning left radial cephalic AVF on [id number].  His wife states that it is being used at HD and have not stated whether or not it has improved.  The pt states Dr. Jimmy Footman said it was around 73%.    Allergies  Allergen Reactions  . Codeine Other (See Comments)    Makes patient incoherent. STATES MAKES HIM COMATOSE  . Tape Other (See Comments)    Plastic tape tears skin off, please use paper tape instead.    Current Outpatient Prescriptions  Medication Sig Dispense Refill  . aspirin 81 MG tablet Take 81 mg by mouth daily.    [name] atorvastatin (LIPITOR) 10 MG tablet TAKE 1 TABLET BY MOUTH EVERY DAY 30 tablet 6  . b complex-vitamin c-folic acid (NEPHRO-VITE) 0.8 MG TABS Take 0.8 mg by mouth at bedtime.    . calcium acetate (PHOSLO) 667 MG capsule Take 2,001 mg by mouth 3 (three) times daily with meals.     . darbepoetin (ARANESP) 25 MCG/0.42ML SOLN injection Inject 0.42 mLs (25 mcg total) into the vein every Wednesday with hemodialysis. 14.28 mL 1  . midodrine (PROAMATINE) 10 MG tablet Take 10 mg by mouth 2 (two) times daily.    . sevelamer carbonate (RENVELA) 800 MG tablet Take 1,600 mg by mouth 3 (three) times daily with meals.      No current facility-administered medications for this visit.   Facility-Administered Medications Ordered in Other Visits  Medication Dose Route Frequency Provider Last Rate Last Dose  . Chlorhexidine Gluconate Cloth 2 % PADS 6 each  6 each Topical Once [name], MD         ROS:  See HPI  Physical Exam:  Filed Vitals:   08/14/14 1553  BP: 100/65  Pulse: 67  Resp: 18    Incision:  Healing nicely Extremities:  +thrill/bruit within fistula   Assessment/Plan:  This is a 64 y.o. male who is s/p: Left arm fistula with side branch ligation on 07/29/14  -His fistula is being used without difficulty  at this time -we will get his stitches out today -he will f/u with [place] as needed   Leontine Locket, PA-C [facility] [phone]  Clinic MD:  Pt seen and examined with Dr. Oneida Alar

## 2014-08-19 ENCOUNTER — Other Ambulatory Visit: Payer: Self-pay | Admitting: Interventional Radiology

## 2014-08-19 ENCOUNTER — Other Ambulatory Visit (HOSPITAL_COMMUNITY): Payer: Self-pay | Admitting: Interventional Radiology

## 2014-08-19 DIAGNOSIS — C642 Malignant neoplasm of left kidney, except renal pelvis: Secondary | ICD-10-CM

## 2014-08-19 DIAGNOSIS — C641 Malignant neoplasm of right kidney, except renal pelvis: Secondary | ICD-10-CM

## 2014-08-20 ENCOUNTER — Other Ambulatory Visit: Payer: Medicare Other

## 2014-08-20 ENCOUNTER — Other Ambulatory Visit: Payer: Self-pay | Admitting: *Deleted

## 2014-08-20 DIAGNOSIS — C649 Malignant neoplasm of unspecified kidney, except renal pelvis: Secondary | ICD-10-CM

## 2014-09-10 DIAGNOSIS — I2510 Atherosclerotic heart disease of native coronary artery without angina pectoris: Secondary | ICD-10-CM | POA: Insufficient documentation

## 2014-09-30 LAB — CREATININE WITH EST GFR
Creat: 4.43 mg/dL — ABNORMAL HIGH (ref 0.50–1.35)
GFR, Est African American: 15 mL/min — ABNORMAL LOW
GFR, Est Non African American: 13 mL/min — ABNORMAL LOW

## 2014-09-30 LAB — BUN: BUN: 38 mg/dL — AB (ref 6–23)

## 2014-10-01 ENCOUNTER — Ambulatory Visit
Admission: RE | Admit: 2014-10-01 | Discharge: 2014-10-01 | Disposition: A | Discharge status: Home / Self Care | Payer: Medicare Other | Source: Ambulatory Visit | Attending: Interventional Radiology | Admitting: Interventional Radiology

## 2014-10-01 ENCOUNTER — Ambulatory Visit (HOSPITAL_COMMUNITY)
Admission: RE | Admit: 2014-10-01 | Discharge: 2014-10-01 | Disposition: A | Discharge status: Home / Self Care | Payer: Medicare Other | Source: Ambulatory Visit | Attending: Interventional Radiology | Admitting: Interventional Radiology

## 2014-10-01 ENCOUNTER — Encounter (HOSPITAL_COMMUNITY): Payer: Self-pay

## 2014-10-01 DIAGNOSIS — C641 Malignant neoplasm of right kidney, except renal pelvis: Secondary | ICD-10-CM

## 2014-10-01 DIAGNOSIS — C642 Malignant neoplasm of left kidney, except renal pelvis: Secondary | ICD-10-CM

## 2014-10-01 DIAGNOSIS — R934 Abnormal findings on diagnostic imaging of urinary organs: Secondary | ICD-10-CM | POA: Insufficient documentation

## 2014-10-01 DIAGNOSIS — R161 Splenomegaly, not elsewhere classified: Secondary | ICD-10-CM | POA: Diagnosis not present

## 2014-10-01 DIAGNOSIS — C649 Malignant neoplasm of unspecified kidney, except renal pelvis: Secondary | ICD-10-CM | POA: Insufficient documentation

## 2014-10-01 MED ORDER — IOHEXOL 300 MG/ML  SOLN
100.0000 mL | Freq: Once | INTRAMUSCULAR | Status: AC | PRN
  Administered 2014-10-01: 100 mL via INTRAVENOUS

## 2014-10-01 NOTE — Progress Notes (Signed)
Chief Complaint: Chief Complaint  Patient presents with  . Follow-up    follow up Renal Cryoablation x2     History of Present Illness: [name] is a 64 y.o. male here for follow-up after recent detection of an area of enhancing peripheral nodularity along the posterolateral margin of previous right renal radiofrequency ablation in 2009 to treat a clear cell carcinoma.  Follow-up CT was performed today. [name] continues to be in the process of clearance for renal transportation at [facility].  Past Medical History  Diagnosis Date  . CAD, NATIVE VESSEL 01/08/2008       . HYPERLIPIDEMIA-MIXED 01/08/2008  . OVERWEIGHT/OBESITY 01/08/2008    Lost 205 lbs through diet and exercise.    . Anemia   . Bruises easily   . Blind     both eyes removed   . Family history of breast cancer     mother  . Cellulitis late 1980's    "hospitalized; wrapped both legs; several times; no OR for this"  . Peripheral vascular disease   . Colon polyps   . HYPERTENSION 01/27/2009    BP low  . Hypotension   . Pneumonia 2000;s X 1  . DM type 2 (diabetes mellitus, type 2)     no medications (07/28/2014)  . Arthritis     "back, neck" (07/28/2014)  . ESRD (end stage renal disease) on dialysis     [facility]; Mon, Wed, Fri (07/28/2014)  . Renal insufficiency   . Prostate cancer   . Renal cell carcinoma 2001 and 2003    "both kidneys"    Past Surgical History  Procedure Laterality Date  . Foot amputation Right ~ 2002    right;  partial; "infection"  . Colonoscopy  06/14/2011    Procedure: COLONOSCOPY;  Surgeon: [name], MD;  [facility];  Service: Endoscopy;  Laterality: N/A;  . Hot hemostasis  06/14/2011    Procedure: HOT HEMOSTASIS (ARGON PLASMA COAGULATION/BICAP);  Surgeon: [name], MD;  Location: [facility];  Service: Endoscopy;  Laterality: N/A;  . Av fistula placement Left 02/2010    LFA  . Varicose vein surgery  mid 1980's     BLE; "knees down; both  legs; 2 separate times"  . Radiofrequency ablation kidney  2010-2012    "twice; one on each side; for cancer"  . Coronary artery bypass graft  09/29/2011    Procedure: CORONARY ARTERY BYPASS GRAFTING (CABG);  Surgeon: [name], MD;  Location: [place];  Service: Open Heart Surgery;  Laterality: N/A;  Coronary Artery Bypass Graft times two utilizing the left internal mammary artery and the right greater saphenous vein harvested endoscopically.  . Colonoscopy N/A 07/24/2012    Procedure: COLONOSCOPY;  Surgeon: [name], MD;  [facility];  Service: Endoscopy;  Laterality: N/A;  . Hip pinning,cannulated Left 12/31/2013    Procedure: CANNULATED HIP PINNING;  Surgeon: Renette Butters, MD;  Location: [place];  Service: Orthopedics;  Laterality: Left;  Carm, FX Table, Stryker  . Left heart catheterization with coronary angiogram N/A 09/27/2011    Procedure: LEFT HEART CATHETERIZATION WITH CORONARY ANGIOGRAM;  Surgeon: [name], MD;  [facility]: [facility] CATH LAB;  Service: Cardiovascular;  Laterality: N/A;  . Colonoscopy with propofol N/A 05/13/2014    Procedure: COLONOSCOPY WITH PROPOFOL;  Surgeon: [name], MD;  [facility];  Service: Endoscopy;  Laterality: N/A;  . Colonoscopy    . Enucleation  2003; 2006  bilateral; "diabetes; pain"  . Fracture surgery    . Cholecystectomy open  1992  . Insertion prostate radiation seed  03/2012  . Cystoscopy with retrograde pyelogram, ureteroscopy and stent placement Left 07/28/2014    Procedure: CYSTOSCOPY WITH RETROGRADE PYELOGRAM, URETERAL BALLOON DILITATION, URETEROSCOPY AND LEFT STENT PLACEMENT;  Surgeon: [name], MD;  [facility];  Service: Urology;  Laterality: Left;  . Ligation of competing branches of arteriovenous fistula Left 07/29/2014    Procedure: LIGATION OF COMPETING BRANCHES OF LEFT ARM ARTERIOVENOUS FISTULA;  Surgeon: [name], MD;  Location: [place];  Service: Vascular;  Laterality: Left;     Allergies: Codeine and Tape  Medications: Prior to Admission medications   Medication Sig Start Date End Date Taking? Authorizing Provider  aspirin 81 MG tablet Take 81 mg by mouth daily.   Yes Historical Provider, MD  atorvastatin (LIPITOR) 10 MG tablet TAKE 1 TABLET BY MOUTH EVERY DAY 05/13/14  Yes Thayer Headings, MD  b complex-vitamin c-folic acid (NEPHRO-VITE) 0.8 MG TABS Take 0.8 mg by mouth at bedtime.   Yes Historical Provider, MD  calcium acetate (PHOSLO) 667 MG capsule Take 2,001 mg by mouth 3 (three) times daily with meals.    Yes Historical Provider, MD  darbepoetin (ARANESP) 25 MCG/0.42ML SOLN injection Inject 0.42 mLs (25 mcg total) into the vein every Wednesday with hemodialysis. 01/03/14  Yes [name], MD  midodrine (PROAMATINE) 10 MG tablet Take 10 mg by mouth 2 (two) times daily.   Yes Historical Provider, MD  sevelamer carbonate (RENVELA) 800 MG tablet Take 1,600 mg by mouth 3 (three) times daily with meals.  06/26/14  Yes Historical Provider, MD     Family History  Problem Relation Age of Onset  . Cancer    . Coronary artery disease    . Kidney disease    . Breast cancer    . Ovarian cancer    . Lung cancer    . Diabetes    . Cancer Mother 78    breast, spine and ovarian  . Heart disease Mother   . Hyperlipidemia Mother   . Hypertension Mother   . Varicose Veins Mother   . Emphysema Father   . Cancer Father 36    kidney, prostate  . Heart disease Father   . Hyperlipidemia Father   . Hypertension Father   . Cancer Sister     ovarian  . Cancer Brother     lung  . Heart disease Brother   . Hyperlipidemia Brother   . Hypertension Brother   . Heart attack Brother   . Peripheral vascular disease Brother   . Malignant hyperthermia Neg Hx     History   Social History  . Marital Status: Married    Spouse Name: N/A  . Number of Children: N/A  . Years of Education: N/A   Social History Main Topics  . Smoking status: Never Smoker   . Smokeless  tobacco: Never Used  . Alcohol Use: No  . Drug Use: No  . Sexual Activity: No   Other Topics Concern  . Not on file   Social History Narrative    Review of Systems: A 12 point ROS discussed and pertinent positives are indicated in the HPI above.  All other systems are negative.  Review of Systems  Constitutional: Negative.   Respiratory: Negative.   Cardiovascular: Negative.   Gastrointestinal: Negative.   Genitourinary: Negative.   Musculoskeletal: Negative.   Neurological: Negative.  Vital Signs: BP 85/57 mmHg  Pulse 67  Temp(Src) 97.8 F (36.6 C) (Oral)  Resp 14  Ht 6' 4"  (1.93 m)  Wt 215 lb (97.523 kg)  BMI 26.18 kg/m2  SpO2 99%  Physical Exam  Constitutional: He is oriented to person, place, and time. No distress.  Neurological: He is alert and oriented to person, place, and time.  Nursing note and vitals reviewed.    Imaging: Ct Abd Wo & W Cm  10/01/2014   CLINICAL DATA:  Followup radiofrequency ablation of a right clear cell renal cell cancer in January 2009. Cryoablation of a left renal clear cell carcinoma in December 2011.  EXAM: CT ABDOMEN WITHOUT AND WITH CONTRAST  TECHNIQUE: Multidetector CT imaging of the abdomen was performed following the standard protocol before and following the bolus administration of intravenous contrast.  CONTRAST:  192m OMNIPAQUE IOHEXOL 300 MG/ML  SOLN  COMPARISON:  CT scan 07/28/2014 an MRI 03/05/2014  FINDINGS: Lower chest: The lung bases are clear of acute findings. No worrisome pulmonary nodules. Right basilar scarring changes and mild eventration of the right hemidiaphragm. The heart is normal in size. Dense 3 vessel coronary artery calcifications are noted. No pericardial effusion. The distal esophagus is grossly normal.  Hepatobiliary: No focal hepatic lesions. The gallbladder is surgically absent. Mild stable intra and extrahepatic biliary dilatation.  Pancreas: No pancreatic mass, inflammation or ductal dilatation.   Spleen: Stable splenomegaly. The spleen measures 16.4 x 12.8 x 8.2 cm. No focal lesions.  Adrenals/Urinary Tract: The adrenal glands are unremarkable and stable.  Stable atrophy of the left kidney with small cysts. Prior cryoablation site appears stable. No findings for recurrent tumor. No hydronephrosis.  Stable cryoablation site involving the right kidney with central fluid and surrounding fat. There is a small but enlarging and enhancing nodule along the periphery of the lesion. This measures 11 x 10.5 mm and previously measured 8 x 7.5 mm. It is 30 Hounsfield units precontrast and 66 Hounsfield units postcontrast. This is suspicious for a small recurrent tumor focus.  Stomach/Bowel: The stomach, duodenum, small bowel and colon are grossly normal without oral contrast. There is a moderate size duodenum diverticulum near the pancreatic head. No inflammatory changes, mass lesions or obstructive findings.  Vascular/Lymphatic: No mesenteric or retroperitoneal mass or adenopathy. Stable aortic and branch vessel calcifications but no aneurysm or dissection.  Other: No abdominal ascites. Small anterior abdominal wall hernia containing fat.  Musculoskeletal: No significant bony findings.  IMPRESSION: 1. Small but enlarging and enhancing nodule along the periphery of the RF ablation site on the right kidney. This is worrisome for small focal tumor recurrence. No findings for metastatic disease or adenopathy. 2. Stable appearance of the left kidney. 3. Stable splenomegaly.   Electronically Signed   By: PMarijo SanesM.D.   On: 10/01/2014 14:51    Labs:  CBC:  Recent Labs  01/04/14 0450 07/28/14 0645 07/28/14 0654 07/28/14 1700 07/29/14 0802  WBC 3.7* 5.7  --  5.9 4.5  HGB 9.4* 12.5* 12.2* 11.7* 10.5*  HCT 28.1* 36.6* 36.0* 34.3* 31.0*  PLT 89* 97*  --  95* 87*    COAGS:  Recent Labs  07/28/14 0645  INR 0.98    BMP:  Recent Labs  01/03/14 0407 01/04/14 0450 07/28/14 0645 07/28/14 0654  07/28/14 1700 07/29/14 0803 09/29/14 1422  NA 138 141 138 137  --  139  --   K 4.6 4.2 5.5* 5.4*  --  6.1*  --   CL  94* 100 94* 99  --  99  --   CO2 27 28 24   --   --  23  --   GLUCOSE 100* 93 208* 210*  --  98  --   BUN 63* 41* 144* 135*  --  152* 38*  CALCIUM 9.5 8.8 10.2  --   --  9.3  --   CREATININE 7.66* 5.43* 9.83* 9.10* 10.32* 10.91* 4.43*  GFRNONAA 7* 10* 5*  --  5* 4* 13*  GFRAA 8* 12* 6*  --  5* 5* 15*    LIVER FUNCTION TESTS:  Recent Labs  07/28/14 0645 07/29/14 0803  BILITOT 0.7  --   AST 12  --   ALT 13  --   ALKPHOS 77  --   PROT 7.3  --   ALBUMIN 4.0 3.2*    Assessment and Plan:  I met with Mr. Julian and his wife. We reviewed the CT study performed today which is directly compared to the study on 07/28/2014. The area of solid enhancing nodularity along the periphery of previous right renal ablation appears larger on today's scan and measures approximately 11 mm, compared to 8 mm previously. The study today was also performed in a dynamic fashion to assess for enhancement with the CT confirming contrast-enhancement of the area of nodularity. This nodularity is along the posterolateral edge of the previous ablation defect. Although this would be an unusual recurrence given the time interval of 7 years since prior treatment, this is suspicious for tumor by CT. I advised Mr. Pett and his wife to seek the opinion of the transplant service at [facility] regarding management of this finding.  SignedAletta Edouard T 10/01/2014, 4:59 PM   I spent a total of 15 minutes face to face in clinical consultation, greater than 50% of which was counseling/coordinating care for right renal ablation recurrence.

## 2015-01-30 HISTORY — PX: NEPHRECTOMY: SHX65

## 2015-02-13 ENCOUNTER — Encounter (HOSPITAL_COMMUNITY): Payer: Self-pay

## 2015-02-13 ENCOUNTER — Emergency Department (HOSPITAL_COMMUNITY): Payer: Medicare Other

## 2015-02-13 ENCOUNTER — Observation Stay (HOSPITAL_COMMUNITY)
Admission: EM | Admit: 2015-02-13 | Discharge: 2015-02-16 | Disposition: A | Discharge status: Home / Self Care | Payer: Medicare Other | Attending: Internal Medicine | Admitting: Internal Medicine

## 2015-02-13 DIAGNOSIS — D631 Anemia in chronic kidney disease: Secondary | ICD-10-CM | POA: Diagnosis not present

## 2015-02-13 DIAGNOSIS — E785 Hyperlipidemia, unspecified: Secondary | ICD-10-CM | POA: Insufficient documentation

## 2015-02-13 DIAGNOSIS — N186 End stage renal disease: Secondary | ICD-10-CM | POA: Insufficient documentation

## 2015-02-13 DIAGNOSIS — E162 Hypoglycemia, unspecified: Secondary | ICD-10-CM

## 2015-02-13 DIAGNOSIS — I959 Hypotension, unspecified: Secondary | ICD-10-CM | POA: Diagnosis not present

## 2015-02-13 DIAGNOSIS — Z992 Dependence on renal dialysis: Secondary | ICD-10-CM | POA: Insufficient documentation

## 2015-02-13 DIAGNOSIS — D649 Anemia, unspecified: Secondary | ICD-10-CM | POA: Diagnosis present

## 2015-02-13 DIAGNOSIS — I2510 Atherosclerotic heart disease of native coronary artery without angina pectoris: Secondary | ICD-10-CM | POA: Insufficient documentation

## 2015-02-13 DIAGNOSIS — I120 Hypertensive chronic kidney disease with stage 5 chronic kidney disease or end stage renal disease: Secondary | ICD-10-CM | POA: Insufficient documentation

## 2015-02-13 DIAGNOSIS — Z89431 Acquired absence of right foot: Secondary | ICD-10-CM | POA: Insufficient documentation

## 2015-02-13 DIAGNOSIS — E119 Type 2 diabetes mellitus without complications: Secondary | ICD-10-CM

## 2015-02-13 DIAGNOSIS — H547 Unspecified visual loss: Secondary | ICD-10-CM | POA: Diagnosis present

## 2015-02-13 DIAGNOSIS — Z8546 Personal history of malignant neoplasm of prostate: Secondary | ICD-10-CM | POA: Insufficient documentation

## 2015-02-13 DIAGNOSIS — I440 Atrioventricular block, first degree: Secondary | ICD-10-CM | POA: Diagnosis not present

## 2015-02-13 DIAGNOSIS — Z7982 Long term (current) use of aspirin: Secondary | ICD-10-CM | POA: Insufficient documentation

## 2015-02-13 DIAGNOSIS — I10 Essential (primary) hypertension: Secondary | ICD-10-CM | POA: Diagnosis not present

## 2015-02-13 DIAGNOSIS — Z905 Acquired absence of kidney: Secondary | ICD-10-CM | POA: Insufficient documentation

## 2015-02-13 DIAGNOSIS — Z951 Presence of aortocoronary bypass graft: Secondary | ICD-10-CM | POA: Insufficient documentation

## 2015-02-13 DIAGNOSIS — E1122 Type 2 diabetes mellitus with diabetic chronic kidney disease: Secondary | ICD-10-CM | POA: Diagnosis not present

## 2015-02-13 DIAGNOSIS — I441 Atrioventricular block, second degree: Secondary | ICD-10-CM

## 2015-02-13 DIAGNOSIS — Z79899 Other long term (current) drug therapy: Secondary | ICD-10-CM | POA: Diagnosis not present

## 2015-02-13 DIAGNOSIS — Z885 Allergy status to narcotic agent status: Secondary | ICD-10-CM | POA: Diagnosis not present

## 2015-02-13 DIAGNOSIS — E11649 Type 2 diabetes mellitus with hypoglycemia without coma: Principal | ICD-10-CM | POA: Insufficient documentation

## 2015-02-13 DIAGNOSIS — H540 Blindness, both eyes: Secondary | ICD-10-CM | POA: Diagnosis present

## 2015-02-13 DIAGNOSIS — C649 Malignant neoplasm of unspecified kidney, except renal pelvis: Secondary | ICD-10-CM | POA: Diagnosis not present

## 2015-02-13 LAB — COMPREHENSIVE METABOLIC PANEL
ALBUMIN: 2.7 g/dL — AB (ref 3.5–5.0)
ALT: 8 U/L — ABNORMAL LOW (ref 17–63)
ANION GAP: 17 — AB (ref 5–15)
AST: 18 U/L (ref 15–41)
Alkaline Phosphatase: 79 U/L (ref 38–126)
BILIRUBIN TOTAL: 0.5 mg/dL (ref 0.3–1.2)
BUN: 69 mg/dL — ABNORMAL HIGH (ref 6–20)
CALCIUM: 9.9 mg/dL (ref 8.9–10.3)
CHLORIDE: 96 mmol/L — AB (ref 101–111)
CO2: 27 mmol/L (ref 22–32)
Creatinine, Ser: 8.32 mg/dL — ABNORMAL HIGH (ref 0.61–1.24)
GFR calc Af Amer: 7 mL/min — ABNORMAL LOW (ref >60)
GFR calc non Af Amer: 6 mL/min — ABNORMAL LOW (ref >60)
GLUCOSE: 59 mg/dL — AB (ref 65–99)
Potassium: 4.8 mmol/L (ref 3.5–5.1)
Sodium: 140 mmol/L (ref 135–145)
Total Protein: 6 g/dL — ABNORMAL LOW (ref 6.5–8.1)

## 2015-02-13 LAB — CBC WITH DIFFERENTIAL/PLATELET
BASOS PCT: 0 %
Basophils Absolute: 0 10*3/uL (ref 0.0–0.1)
EOS PCT: 1 %
Eosinophils Absolute: 0.1 10*3/uL (ref 0.0–0.7)
HEMATOCRIT: 27.3 % — AB (ref 39.0–52.0)
Hemoglobin: 8.6 g/dL — ABNORMAL LOW (ref 13.0–17.0)
Lymphocytes Relative: 6 %
Lymphs Abs: 0.4 10*3/uL — ABNORMAL LOW (ref 0.7–4.0)
MCH: 31.7 pg (ref 26.0–34.0)
MCHC: 31.5 g/dL (ref 30.0–36.0)
MCV: 100.7 fL — ABNORMAL HIGH (ref 78.0–100.0)
MONO ABS: 0.2 10*3/uL (ref 0.1–1.0)
MONOS PCT: 2 %
NEUTROS ABS: 6 10*3/uL (ref 1.7–7.7)
Neutrophils Relative %: 91 %
PLATELETS: 186 10*3/uL (ref 150–400)
RBC: 2.71 MIL/uL — ABNORMAL LOW (ref 4.22–5.81)
RDW: 14.9 % (ref 11.5–15.5)
WBC: 6.6 10*3/uL (ref 4.0–10.5)

## 2015-02-13 LAB — CBG MONITORING, ED
GLUCOSE-CAPILLARY: 59 mg/dL — AB (ref 65–99)
GLUCOSE-CAPILLARY: 134 mg/dL — AB (ref 65–99)
Glucose-Capillary: 128 mg/dL — ABNORMAL HIGH (ref 65–99)
Glucose-Capillary: 89 mg/dL (ref 65–99)

## 2015-02-13 LAB — GLUCOSE, CAPILLARY
GLUCOSE-CAPILLARY: 59 mg/dL — AB (ref 65–99)
Glucose-Capillary: 77 mg/dL (ref 65–99)

## 2015-02-13 MED ORDER — BISACODYL 10 MG RE SUPP
10.0000 mg | Freq: Every day | RECTAL | Status: DC | PRN
Start: 2015-02-13 — End: 2015-02-16

## 2015-02-13 MED ORDER — ATORVASTATIN CALCIUM 10 MG PO TABS
10.0000 mg | ORAL_TABLET | Freq: Every day | ORAL | Status: DC
  Administered 2015-02-13 – 2015-02-15 (×3): 10 mg via ORAL
  Filled 2015-02-13 (×3): qty 1

## 2015-02-13 MED ORDER — RENA-VITE PO TABS: 1.0000 | ORAL_TABLET | Freq: Every day | ORAL | Status: DC

## 2015-02-13 MED ORDER — SEVELAMER CARBONATE 800 MG PO TABS
4000.0000 mg | ORAL_TABLET | Freq: Three times a day (TID) | ORAL | Status: DC
  Administered 2015-02-13 – 2015-02-16 (×8): 4000 mg via ORAL
  Filled 2015-02-13 (×10): qty 5

## 2015-02-13 MED ORDER — SENNOSIDES-DOCUSATE SODIUM 8.6-50 MG PO TABS: 1.0000 | ORAL_TABLET | Freq: Every evening | ORAL | Status: DC | PRN

## 2015-02-13 MED ORDER — RENA-VITE PO TABS
1.0000 | ORAL_TABLET | Freq: Every day | ORAL | Status: DC
  Administered 2015-02-13 – 2015-02-15 (×3): 1 via ORAL
  Filled 2015-02-13 (×3): qty 1

## 2015-02-13 MED ORDER — HEPARIN SODIUM (PORCINE) 5000 UNIT/ML IJ SOLN
5000.0000 [IU] | Freq: Three times a day (TID) | INTRAMUSCULAR | Status: DC
  Administered 2015-02-13 – 2015-02-16 (×10): 5000 [IU] via SUBCUTANEOUS
  Filled 2015-02-13 (×9): qty 1

## 2015-02-13 MED ORDER — DEXTROSE-NACL 5-0.9 % IV SOLN
Freq: Once | INTRAVENOUS | Status: AC
  Administered 2015-02-13: 13:00:00 via INTRAVENOUS

## 2015-02-13 MED ORDER — ONDANSETRON HCL 4 MG/2ML IJ SOLN: 4.0000 mg | Freq: Four times a day (QID) | INTRAMUSCULAR | Status: DC | PRN

## 2015-02-13 MED ORDER — NEPRO/CARBSTEADY PO LIQD
237.0000 mL | Freq: Two times a day (BID) | ORAL | Status: DC
  Administered 2015-02-13 – 2015-02-15 (×5): 237 mL via ORAL

## 2015-02-13 MED ORDER — MIDODRINE HCL 5 MG PO TABS
10.0000 mg | ORAL_TABLET | Freq: Two times a day (BID) | ORAL | Status: DC
  Administered 2015-02-13 – 2015-02-16 (×6): 10 mg via ORAL
  Filled 2015-02-13 (×6): qty 2

## 2015-02-13 MED ORDER — ACETAMINOPHEN 325 MG PO TABS
650.0000 mg | ORAL_TABLET | Freq: Four times a day (QID) | ORAL | Status: DC | PRN
  Administered 2015-02-14: 650 mg via ORAL
  Filled 2015-02-13: qty 2

## 2015-02-13 MED ORDER — DEXTROSE 50 % IV SOLN
50.0000 mL | Freq: Once | INTRAVENOUS | Status: AC
  Administered 2015-02-13: 25 mL via INTRAVENOUS
  Filled 2015-02-13: qty 50

## 2015-02-13 MED ORDER — ONDANSETRON HCL 4 MG PO TABS: 4.0000 mg | ORAL_TABLET | Freq: Four times a day (QID) | ORAL | Status: DC | PRN

## 2015-02-13 MED ORDER — ACETAMINOPHEN 650 MG RE SUPP: 650.0000 mg | Freq: Four times a day (QID) | RECTAL | Status: DC | PRN

## 2015-02-13 MED ORDER — DEXTROSE-NACL 5-0.9 % IV SOLN
Freq: Once | INTRAVENOUS | Status: AC
  Administered 2015-02-13: 12:00:00 via INTRAVENOUS

## 2015-02-13 MED ORDER — ASPIRIN EC 81 MG PO TBEC
81.0000 mg | DELAYED_RELEASE_TABLET | Freq: Every day | ORAL | Status: DC
  Administered 2015-02-14 – 2015-02-16 (×3): 81 mg via ORAL
  Filled 2015-02-13 (×4): qty 1

## 2015-02-13 MED ORDER — DEXTROSE 5 % IV SOLN
2.0000 g | Freq: Once | INTRAVENOUS | Status: DC
  Filled 2015-02-13 (×2): qty 2

## 2015-02-13 MED ORDER — SEVELAMER CARBONATE 800 MG PO TABS
4000.0000 mg | ORAL_TABLET | Freq: Once | ORAL | Status: AC
Start: 2015-02-13 — End: 2015-02-13
  Administered 2015-02-13: 4000 mg via ORAL

## 2015-02-13 MED ORDER — DEXTROSE 5 % IV SOLN
2.0000 g | Freq: Once | INTRAVENOUS | Status: AC
  Administered 2015-02-13: 2 g via INTRAVENOUS
  Filled 2015-02-13 (×3): qty 2

## 2015-02-13 MED ORDER — ALUM & MAG HYDROXIDE-SIMETH 200-200-20 MG/5ML PO SUSP: 30.0000 mL | Freq: Four times a day (QID) | ORAL | Status: DC | PRN

## 2015-02-13 NOTE — Progress Notes (Signed)
Pt arrived to unit by bed at 1441.  A & O X 4, lungs coarse, no edema.  Regular rate and rhythm appreciated.  Bruit and thrill appreciated.  LLAVF cannulated with 15 G.  Tx initiated at 1503 with goal of 2278mL and net of 1L.  Will continue to monitor.

## 2015-02-13 NOTE — Procedures (Signed)
Patient seen on Hemodialysis. QB 425, UF goal 2.2L Treatment adjusted as needed.  Elmarie Shiley MD [facility]. Office # [phone] Pager # [phone] 3:58 PM

## 2015-02-13 NOTE — ED Provider Notes (Signed)
CSN: [id number]     Arrival date & time 02/13/15  [id number] History   First MD Initiated Contact with Patient 02/13/15 0757     Chief Complaint  Patient presents with  . Hypoglycemia     (Consider location/radiation/quality/duration/timing/severity/associated sxs/prior Treatment) HPI Comments: Patient is a 64 year old male with past medical history of DM-2, RCC, ESRD on dialysis (mon, wed, fri) who presents to the ED via EMS with complaint of hypoglycemia, onset PTA. Patient's wife reports that while the patient was at dialysis this morning his CBG was 40, resulting in him not completing dialysis. EMS was called and administered oral glucose, CBG rechecked at 101. Wife notes that the patient had right nephrectomy surgery 2 weeks ago at [facility] for renal tumor (noncancerous). She states the patient was discharged home on Monday and he had his first dialysis treatment on Wednesday. She states the patient appeared weak and slightly confused last night resulting in her calling EMS. His CBG last night was 20, oral glucose was administered and his CBG increased resulting in the patient not needing to be transported to the ED. She notes the patient also appeared weak this morning. Patient denies any pain or other complaints at this time. Denies fever, chills, cough, SOB, CP, abdominal pain, nausea, vomiting, diarrhea, syncope, weakness. Pt starting taking 2mg  Glimepiride 6 weeks ago. Wife reports the pt took his dose this morning with eggs prior to dialysis. Pt is also taking Ceftazidime for UTI. Patient states he is able to produce appx. 4oz. urine daily.    Past Medical History  Diagnosis Date  . CAD, NATIVE VESSEL 01/08/2008       . HYPERLIPIDEMIA-MIXED 01/08/2008  . OVERWEIGHT/OBESITY 01/08/2008    Lost 205 lbs through diet and exercise.    . Anemia   . Bruises easily   . Blind     both eyes removed   . Family history of breast cancer     mother  . Cellulitis late 1980's    "hospitalized;  wrapped both legs; several times; no OR for this"  . Peripheral vascular disease ([place])   . Colon polyps   . HYPERTENSION 01/27/2009    BP low  . Hypotension   . Pneumonia 2000;s X 1  . DM type 2 (diabetes mellitus, type 2) ([place])     no medications (07/28/2014)  . Arthritis     "back, neck" (07/28/2014)  . ESRD (end stage renal disease) on dialysis [facility])     [name]; Mon, Wed, Fri (07/28/2014)  . Renal insufficiency   . Prostate cancer ([place])   . Renal cell carcinoma 2001 and 2003    "both kidneys"   Past Surgical History  Procedure Laterality Date  . Foot amputation Right ~ 2002    right;  partial; "infection"  . Colonoscopy  06/14/2011    Procedure: COLONOSCOPY;  Surgeon: [name], MD;  [facility];  Service: Endoscopy;  Laterality: N/A;  . Hot hemostasis  06/14/2011    Procedure: HOT HEMOSTASIS (ARGON PLASMA COAGULATION/BICAP);  Surgeon: [name], MD;  Location[facility];  Service: Endoscopy;  Laterality: N/A;  . Av fistula placement Left 02/2010    LFA  . Varicose vein surgery  mid 1980's     BLE; "knees down; both legs; 2 separate times"  . Radiofrequency ablation kidney  2010-2012    "twice; one on each side; for cancer"  . Coronary artery bypass graft  09/29/2011    Procedure: CORONARY ARTERY BYPASS GRAFTING (CABG);  Surgeon: [name], MD;  Location: [place];  Service: Open Heart Surgery;  Laterality: N/A;  Coronary Artery Bypass Graft times two utilizing the left internal mammary artery and the right greater saphenous vein harvested endoscopically.  . Colonoscopy N/A 07/24/2012    Procedure: COLONOSCOPY;  Surgeon: [name], MD;  [facility];  Service: Endoscopy;  Laterality: N/A;  . Hip pinning,cannulated Left 12/31/2013    Procedure: CANNULATED HIP PINNING;  Surgeon: Renette Butters, MD;  Location: [place];  Service: Orthopedics;  Laterality: Left;  Carm, FX Table, Stryker  . Left heart catheterization with coronary angiogram  N/A 09/27/2011    Procedure: LEFT HEART CATHETERIZATION WITH CORONARY ANGIOGRAM;  Surgeon: [name], MD;  Location[facility] C[facility] LAB;  Service: Cardiovascular;  Laterality: N/A;  . Colonoscopy with propofol N/A 05/13/2014    Procedure: COLONOSCOPY WITH PROPOFOL;  Surgeon: [name], MD;  [facility];  Service: Endoscopy;  Laterality: N/A;  . Colonoscopy    . Enucleation  2003; 2006    bilateral; "diabetes; pain"  . Fracture surgery    . Cholecystectomy open  1992  . Insertion prostate radiation seed  03/2012  . Cystoscopy with retrograde pyelogram, ureteroscopy and stent placement Left 07/28/2014    Procedure: CYSTOSCOPY WITH RETROGRADE PYELOGRAM, URETERAL BALLOON DILITATION, URETEROSCOPY AND LEFT STENT PLACEMENT;  Surgeon: [name], MD;  [facility];  Service: Urology;  Laterality: Left;  . Ligation of competing branches of arteriovenous fistula Left 07/29/2014    Procedure: LIGATION OF COMPETING BRANCHES OF LEFT ARM ARTERIOVENOUS FISTULA;  Surgeon: [name], MD;  Location: [facility];  Service: Vascular;  Laterality: Left;   Family History  Problem Relation Age of Onset  . Cancer    . Coronary artery disease    . Kidney disease    . Breast cancer    . Ovarian cancer    . Lung cancer    . Diabetes    . Cancer Mother 84    breast, spine and ovarian  . Heart disease Mother   . Hyperlipidemia Mother   . Hypertension Mother   . Varicose Veins Mother   . Emphysema Father   . Cancer Father 66    kidney, prostate  . Heart disease Father   . Hyperlipidemia Father   . Hypertension Father   . Cancer Sister     ovarian  . Cancer Brother     lung  . Heart disease Brother   . Hyperlipidemia Brother   . Hypertension Brother   . Heart attack Brother   . Peripheral vascular disease Brother   . Malignant hyperthermia Neg Hx    Social History  Substance Use Topics  . Smoking status: Never Smoker   . Smokeless tobacco: Never Used  . Alcohol Use: No     Review of Systems  Neurological: Positive for weakness.  All other systems reviewed and are negative.     Allergies  Codeine and Tape  Home Medications   Prior to Admission medications   Medication Sig Start Date End Date Taking? Authorizing Provider  aspirin 81 MG tablet Take 81 mg by mouth daily.    Historical Provider, MD  atorvastatin (LIPITOR) 10 MG tablet TAKE 1 TABLET BY MOUTH EVERY DAY 05/13/14   Thayer Headings, MD  b complex-vitamin c-folic acid (NEPHRO-VITE) 0.8 MG TABS Take 0.8 mg by mouth at bedtime.    Historical Provider, MD  calcium acetate (PHOSLO) 667 MG capsule Take 2,001 mg  by mouth 3 (three) times daily with meals.     Historical Provider, MD  darbepoetin (ARANESP) 25 MCG/0.42ML SOLN injection Inject 0.42 mLs (25 mcg total) into the vein every Wednesday with hemodialysis. 01/03/14   [name], MD  midodrine (PROAMATINE) 10 MG tablet Take 10 mg by mouth 2 (two) times daily.    Historical Provider, MD  sevelamer carbonate (RENVELA) 800 MG tablet Take 1,600 mg by mouth 3 (three) times daily with meals.  06/26/14   Historical Provider, MD   BP 114/64 mmHg  Pulse 72  Temp(Src) 97.7 F (36.5 C) (Oral)  Resp 17  Ht 6\' 4"  (1.93 m)  Wt 217 lb (98.431 kg)  BMI 26.43 kg/m2  SpO2 94% Physical Exam  Constitutional: He is oriented to person, place, and time. He appears well-developed and well-nourished. No distress.  HENT:  Head: Normocephalic and atraumatic.  Mouth/Throat: Oropharynx is clear and moist. No oropharyngeal exudate.  Eyes:  Bilateral prosthestic eyes  Neck: Normal range of motion. Neck supple.  Cardiovascular: Normal rate, regular rhythm, normal heart sounds and intact distal pulses.   Pulmonary/Chest: Effort normal and breath sounds normal. No respiratory distress. He has no rales. He exhibits no tenderness.  Abdominal: Soft. Bowel sounds are normal. He exhibits no distension and no mass. There is no tenderness. There is no rebound and no guarding.   Musculoskeletal: Normal range of motion. He exhibits no edema or tenderness.  Lymphadenopathy:    He has no cervical adenopathy.  Neurological: He is alert and oriented to person, place, and time. He has normal strength. No sensory deficit. Coordination normal.  Skin: Skin is warm and dry. He is not diaphoretic.  PICC line in right upper chest, no surrounding erythema, drainage or swelling.  Multiple well-healing surgical scars on abdomen with staples in place, no drainage, no surrounding erythema or swelling, no TTP.  AV fistula in left arm.  Nursing note and vitals reviewed.   ED Course  Procedures (including critical care time) Labs Review Labs Reviewed  CBG MONITORING, ED - Abnormal; Notable for the following:    Glucose-Capillary 59 (*)    All other components within normal limits  CBC WITH DIFFERENTIAL/PLATELET  COMPREHENSIVE METABOLIC PANEL    Imaging Review No results found. I have personally reviewed and evaluated these images and lab results as part of my medical decision-making.  Filed Vitals:   02/13/15 1600  BP: 78/39  Pulse: 64  Temp:   Resp:      MDM   Final diagnoses:  Hypoglycemia    Patient presents with hypoglycemia. History of ESRD 2/2 DM-2 on dialysis. Wife reports episode of hypoglycemia last night, resolved with oral glucose administered by EMS at home. This morning at dialysis CBG 40 prior to tx. Pt reports taking Glimeperide x6 weeks. Reports recent right nephrectomy done in [place]. Patient has PICC line in right chest and has been getting Ceftazidime for tx of UTI. VSS. Exam revealed multiple well-healing surgical scars on abdomen with staples in place, no drainage or evidence of infection, A&Ox3, exam otherwise unremarkable.  CBG 58 in ED. Patient given D50. Labs unremarkable. CBG rechecked 1 hour later, 134. Chest x-ray reveals no acute abnormalities, right PICC line seen and lower SVC. CBG rechecked another hour later, 128. CBG recheck  another hour later, 89. D-5 drip ordered. Due to patient's glucose continuing to decrease in the ED and will consult hospitalist for admission. Hospitalist agrees to admission for obs. Nephrologist consulted. Discussed results and plan for  admission with patient.    Chesley Noon [facility], [place] 02/13/15 [employer] Y[employer], MD 02/14/15 [phone]

## 2015-02-13 NOTE — ED Notes (Signed)
[facility]- pt here from dialysis with hypoglycemia. Pt's blood sugar was 20 at home last night, EMS was called, administered insta-glucose and CBG improved. Pt was at dialysis this morning and CBG was 40. Insta-glucose administered again and CBG up to 101 with EMS. CBG of 59 on arrival.

## 2015-02-13 NOTE — H&P (Signed)
[facility] History and Physical  [name] [id number] DOB: Nov 29, 1950 DOA: 02/13/2015  Referring physician: Modena Jansky PCP: [name], MD   Chief Complaint: hypoglycemia  HPI: [name] is a very pleasant 64 y.o. male with past medical history that includes diabetes type 2, end-stage renal disease on dialysis Monday Wednesday and Friday, renal cell carcinoma status post nephrectomy at [facility] 3 weeks ago, CAD, anemia, blindness, hypertension presents to the emergency department from the dialysis center with chief complaint of hypoglycemia. Initial evaluation in the emergency department reveals serum glucose of 59. Wife reports last night patient became diaphoretic and lethargic EMS was called his blood sugar at that time was 40. He was given oral glucose and his CBG increased and he was not transported to the emergency department. They do not own CBG monitor so they did not check his glucose when he awakened this morning and he took his morning glimepiride. He states he was alert and oriented not lethargic and not diaphoretic. He got to dialysis blood sugar was 40 and he did not complete dialysis. Again he was given oral glucose CBG rechecked and it was 101. He was transported to the emergency department. He denies any fever chills cough chest pain palpitations. He denies any abdominal pain nausea vomiting diarrhea. He does report 4 days ago he was discharged from the hospital after a right nephrectomy 2 weeks ago. He resumed his Monday Wednesday Friday dialysis Wednesday of this week. He also reports a recent UTI for which he was on antibiotics.  Workup in the emergency department includes complete blood count significant for hemoglobin of 8.6 otherwise unremarkable, comprehensive metabolic panel significant for creatinine of 8.32. Chest xray with picc line in place no infiltrate  In the emergency department he is afebrile hemodynamically stable and not hypoxic. He is  provided with 1 amp of D50 and IV of D5 normal saline at 75 was initiated. At the time of my exam CBG 89 trending down from 134 after initial amp.  Review of Systems:  10 point review of systems complete and all systems are negative except as indicated in history of present illness Past Medical History  Diagnosis Date  . CAD, NATIVE VESSEL 01/08/2008       . HYPERLIPIDEMIA-MIXED 01/08/2008  . OVERWEIGHT/OBESITY 01/08/2008    Lost 205 lbs through diet and exercise.    . Anemia   . Bruises easily   . Blind     both eyes removed   . Family history of breast cancer     mother  . Cellulitis late 1980's    "hospitalized; wrapped both legs; several times; no OR for this"  . Peripheral vascular disease ([place])   . Colon polyps   . HYPERTENSION 01/27/2009    BP low  . Hypotension   . Pneumonia 2000;s X 1  . DM type 2 (diabetes mellitus, type 2) ([place])     no medications (07/28/2014)  . Arthritis     "back, neck" (07/28/2014)  . ESRD (end stage renal disease) on dialysis [facility])     [name]; Mon, Wed, Fri (07/28/2014)  . Renal insufficiency   . Prostate cancer ([place])   . Renal cell carcinoma 2001 and 2003    "both kidneys"   Past Surgical History  Procedure Laterality Date  . Foot amputation Right ~ 2002    right;  partial; "infection"  . Colonoscopy  06/14/2011    Procedure: COLONOSCOPY;  Surgeon: [name], MD;  [facility];  Service: Endoscopy;  Laterality: N/A;  . Hot hemostasis  06/14/2011    Procedure: HOT HEMOSTASIS (ARGON PLASMA COAGULATION/BICAP);  Surgeon: [name], MD;  Location: [facility];  Service: Endoscopy;  Laterality: N/A;  . Av fistula placement Left 02/2010    LFA  . Varicose vein surgery  mid 1980's     BLE; "knees down; both legs; 2 separate times"  . Radiofrequency ablation kidney  2010-2012    "twice; one on each side; for cancer"  . Coronary artery bypass graft  09/29/2011    Procedure: CORONARY ARTERY BYPASS GRAFTING (CABG);   Surgeon: [name], MD;  Location: [place];  Service: Open Heart Surgery;  Laterality: N/A;  Coronary Artery Bypass Graft times two utilizing the left internal mammary artery and the right greater saphenous vein harvested endoscopically.  . Colonoscopy N/A 07/24/2012    Procedure: COLONOSCOPY;  Surgeon: [name], MD;  Location: [facility] ENDOSCOPY;  Service: Endoscopy;  Laterality: N/A;  . Hip pinning,cannulated Left 12/31/2013    Procedure: CANNULATED HIP PINNING;  Surgeon: Renette Butters, MD;  Location: [place];  Service: Orthopedics;  Laterality: Left;  Carm, FX Table, [employer]  . Left heart catheterization with coronary angiogram N/A 09/27/2011    Procedure: LEFT HEART CATHETERIZATION WITH CORONARY ANGIOGRAM;  Surgeon: [name], MD;  Location: [facility] CATH LAB;  Service: Cardiovascular;  Laterality: N/A;  . Colonoscopy with propofol N/A 05/13/2014    Procedure: COLONOSCOPY WITH PROPOFOL;  Surgeon: [name], MD;  Location: [facility] ENDOSCOPY;  Service: Endoscopy;  Laterality: N/A;  . Colonoscopy    . Enucleation  2003; 2006    bilateral; "diabetes; pain"  . Fracture surgery    . Cholecystectomy open  1992  . Insertion prostate radiation seed  03/2012  . Cystoscopy with retrograde pyelogram, ureteroscopy and stent placement Left 07/28/2014    Procedure: CYSTOSCOPY WITH RETROGRADE PYELOGRAM, URETERAL BALLOON DILITATION, URETEROSCOPY AND LEFT STENT PLACEMENT;  Surgeon: [name], MD;  Location: [facility] ORS;  Service: Urology;  Laterality: Left;  . Ligation of competing branches of arteriovenous fistula Left 07/29/2014    Procedure: LIGATION OF COMPETING BRANCHES OF LEFT ARM ARTERIOVENOUS FISTULA;  Surgeon: [name], MD;  [facility];  Service: Vascular;  Laterality: Left;   Social History:  reports that he has never smoked. He has never used smokeless tobacco. He reports that he does not drink alcohol or use illicit drugs. 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 Codeine Other (See Comments)    Makes patient incoherent. STATES MAKES HIM COMATOSE  . Tape Other (See Comments)    Plastic tape tears skin off, please use paper tape instead.    Family History  Problem Relation Age of Onset  . Cancer    . Coronary artery disease    . Kidney disease    . Breast cancer    . Ovarian cancer    . Lung cancer    . Diabetes    . Cancer Mother 63    breast, spine and ovarian  . Heart disease Mother   . Hyperlipidemia Mother   . Hypertension Mother   . Varicose Veins Mother   . Emphysema Father   . Cancer Father 98    kidney, prostate  . Heart disease Father   . Hyperlipidemia Father   .  Hypertension Father   . Cancer Sister     ovarian  . Cancer Brother     lung  . Heart disease Brother   . Hyperlipidemia Brother   . Hypertension Brother   . Heart attack Brother   . Peripheral vascular disease Brother   . Malignant hyperthermia Neg Hx      Prior to Admission medications   Medication Sig Start Date End Date Taking? Authorizing Provider  aspirin 81 MG tablet Take 81 mg by mouth daily.   Yes Historical Provider, MD  atorvastatin (LIPITOR) 10 MG tablet TAKE 1 TABLET BY MOUTH EVERY DAY 05/13/14  Yes Thayer Headings, MD  b complex-vitamin c-folic acid (NEPHRO-VITE) 0.8 MG TABS Take 0.8 mg by mouth at bedtime.   Yes Historical Provider, MD  glimepiride (AMARYL) 2 MG tablet Take 2 mg by mouth daily with breakfast.   Yes Historical Provider, MD  Methoxy PEG-Epoetin Beta (MIRCERA) 50 MCG/0.3ML SOSY Inject 50 mcg as directed every dialysis.   Yes Historical Provider, MD  midodrine (PROAMATINE) 10 MG tablet Take 10 mg by mouth 2 (two) times daily.   Yes Historical Provider, MD  sevelamer carbonate (RENVELA) 800 MG tablet Take 4,000 mg by mouth 3 (three) times daily with meals.  06/26/14  Yes Historical Provider, MD  cefTAZidime 2 g in  dextrose 5 % 50 mL Inject 2 g into the vein See admin instructions. At dialysis, 11/9-11/11    Historical Provider, MD   Physical Exam: Filed Vitals:   02/13/15 1015 02/13/15 1115 02/13/15 1130 02/13/15 1139  BP: 113/71 109/52 103/67 103/67  Pulse: 67 64 68 66  Temp:      TempSrc:      Resp:    18  Height:      Weight:      SpO2: 96% 95% 93% 95%    Wt Readings from Last 3 Encounters:  02/13/15 98.431 kg (217 lb)  10/01/14 97.523 kg (215 lb)  08/14/14 101.152 kg (223 lb)    General:  Appears calm and comfortable, solidly slightly lethargic Eyes: PERRL, normal lids, irises & conjunctiva ENT: grossly normal hearing, lips & tongue Neck: no LAD, masses or thyromegaly Cardiovascular: RRR, no m/r/g. No LE edema. Telemetry: SR, no arrhythmias  Respiratory: CTA bilaterally, no w/r/r. Normal respiratory effort. Abdomen: soft, positive bowel sounds abdominal staples in place no erythema swelling or drainage. PICC line intact site unremarkable Skin: no rash or induration seen on limited exam Musculoskeletal: grossly normal tone BUE/BLE Psychiatric: grossly normal mood and affect, speech fluent and appropriate Neurologic: grossly non-focal. Speech clear facial symmetry           Labs on Admission:  Basic Metabolic Panel:  Recent Labs Lab 02/13/15 0818  NA 140  K 4.8  CL 96*  CO2 27  GLUCOSE 59*  BUN 69*  CREATININE 8.32*  CALCIUM 9.9   Liver Function Tests:  Recent Labs Lab 02/13/15 0818  AST 18  ALT 8*  ALKPHOS 79  BILITOT 0.5  PROT 6.0*  ALBUMIN 2.7*   No results for input(s): LIPASE, AMYLASE in the last 168 hours. No results for input(s): AMMONIA in the last 168 hours. CBC:  Recent Labs Lab 02/13/15 0818  WBC 6.6  NEUTROABS 6.0  HGB 8.6*  HCT 27.3*  MCV 100.7*  PLT 186   Cardiac Enzymes: No results for input(s): CKTOTAL, CKMB, CKMBINDEX, TROPONINI in the last 168 hours.  BNP (last 3 results) No results for input(s): BNP in the last 8760  hours.  ProBNP (last 3 results) No results for input(s): PROBNP in the last 8760 hours.  CBG:  Recent Labs Lab 02/13/15 0754 02/13/15 0858 02/13/15 0957 02/13/15 1122  GLUCAP 59* 134* 128* 89    Radiological Exams on Admission: Dg Chest 2 View  02/13/2015  CLINICAL DATA:  PICC line placement. EXAM: CHEST  2 VIEW COMPARISON:  12/30/2013 . FINDINGS: Right PICC line noted with tip in the lower superior vena cava . Prior median sternotomy and CABG. Heart size stable. No pulmonary venous congestion. Mild right base subsegmental atelectasis. No pleural effusion or pneumothorax. IMPRESSION: 1.  Right PICC line noted with tip in the lower superior vena cava. 2.  Right base subsegmental atelectasis. 3.  Prior CABG.  Heart size normal. Electronically Signed   By: Marcello Moores  Register   On: 02/13/2015 08:57    EKG:   Assessment/Plan Principal Problem:   Hypoglycemia Active Problems:   ANEMIA   Essential hypertension   CAD, s/p CABG 2013   ESRD (end stage renal disease) on dialysis ([place])   Blind   DM type 2 (diabetes mellitus, type 2) ([place])   DM type 2, goal A1c below 7  #1. Hypoglycemia. Patient with a history of diet-controlled diabetes type 2. Started on Amaryl 6 weeks ago. Right kidney removed 2 weeks ago. Only has sugar checked 3 times a week at dialysis times and began trending up to the 150s therefore Amaryl was started. Suspect hypoglycemic events related to recent change in renal function and unreliable oral intake in the setting of Amaryl. Will admit to telemetry. Will hold Amaryl. Will provide car modified diet. Continue dialysis schedule per nephrology's recommendation. Suspect he will no longer need the Amaryl. Will provide gentle short-term IV fluid in the form of D5 normal saline. Check his CBGs every 4 hours and discontinue fluids as indicated.  #2. End-stage renal disease. He missed dialysis today due to blood sugar of 40. Nephrology consult requested. Defer dialysis schedule  to nephrology. Will continue IV fluids quite judiciously. Monitor intake and output and obtain daily weights.  #3. Diabetes type 2. Wife reports recent hemoglobin A1c is 5.5. Has been diet controlled up until about 6 weeks ago when Amaryl was started. See #1. Will obtain a hemoglobin A1c and monitor closely.  #4. Anemia. Likely anemia of chronic disease. Hemoglobin is currently 8.6 below baseline. Related to recent surgery in the setting of chronic disease. No signs symptoms of active bleeding. Will monitor closely  #5. I per tension. Currently well controlled. Will continue home medications.  #6. CAD status post CABG 2013. No chest pain. Will monitor on telemetry. Continue home meds  nephrology Code Status: full DVT Prophylaxis: Family Communication: wife at bedside Disposition Plan: home hopefully 24 hours  Time spent: 62 minutes  [facility]   I have evaluated the patient, reviewed the chart, modified the above note and discussed the plan with Dyanne Carrel, NP. Hypoglycemia due to Glimepiride. A1c last month was < 5.  Per patient and wife, he usually has a big breakfast prior to going to dialysis every morning and the reading during dialysis may have been elevated for this reason. Will allow patient to eat, monitor sugars over night and stop Glimepiride completely for now. Check sugars at home frequently rather than with dialysis only. He will be dialyzed today.  Expect he can be discharged tomorrow.   Debbe Odea, MD

## 2015-02-13 NOTE — Progress Notes (Signed)
MOHAN LANPHEAR is a 64 y.o. male patient admitted from ED awake, alert - oriented  X 4 - no acute distress noted.  VSS - Blood pressure 110/57, pulse 63, temperature 98.4 F (36.9 C), temperature source Oral, resp. rate 18, height 6\' 4"  (1.93 m), weight 100.3 kg (221 lb 1.9 oz), SpO2 100 %.    IV in place, occlusive dsg intact without redness.  Orientation to room, and floor completed with information packet given to patient/family.  Admission INP armband ID verified with patient/family, and in place.   SR up x 2, fall assessment complete, with patient and family able to verbalize understanding of risk associated with falls, and verbalized understanding to call nsg before up out of bed.  Call light within reach, patient able to voice, and demonstrate understanding.   Will cont to eval and treat per MD orders.  Henriette Combs, [place] 02/13/2015 1:34 PM

## 2015-02-13 NOTE — Consult Note (Signed)
[facility] Renal Consultation Note    Indication for Consultation:  Management of ESRD/hemodialysis; anemia, hypertension/volume and secondary hyperparathyroidism PCP:  HPI: [name] is a 64 y.o. male with ESRD who has hemodialysis M,W,F at [facility]. Past medical history significant for prostate cancer, renal cell carcinoma, DMT2, hypertension, cellulitis, anemia of chronic disease, hyperparathyroidism, CAD, history of morbid obesity, pneumonia, PVD, arthritis, R. Foot amputation. Patient is blind. Patient has recent admission to [facility] from 10/28-11/10/2014 for removal of presumed R renal carcinoma. Wife stated tumor was benign. Procedure was complicated by adhesion of R. Kidney to liver. Dr. Vicente Males was attending. Patient was also found to have gram negative UTI and a central line was inserted for administration of antibiotics.   He presents to the ED after being found to have altered mental status/hypoglycemia at the dialysis center this AM. Apparently pt had BS of 20 last night, called EMS and was given glucose for symptomatic hypoglycemia. BS was improved. This morning at hemodialysis center, he was found to have a BS 40, appeared confused. Was again given glucose with BS improving to 101. Was sent to ED for evaluation. On arrival to ED BS was 59, SCr-8.32, CXR unremarkable with R picc line noted in lower superior vena cava.   Patient has hemodialysis MWF at [facility]. He does not miss treatments, adheres to dialysis prescription. Takes Midodrine 10 mg PO BID for hypotension. Last incenter labs are as follows: HGB 8.6 T Sat 13%. Ca 9.5 C Ca 10.1 PTH 204. Phos 3.2.. Last HA1c was 4.7 on 10/14/216. Patient has history of BS > 200 and was started on glimepiride 2 mg PO daily 0824/2016.   Past Medical History  Diagnosis Date  . CAD, NATIVE VESSEL 01/08/2008       . HYPERLIPIDEMIA-MIXED 01/08/2008  . OVERWEIGHT/OBESITY 01/08/2008    Lost 205 lbs through  diet and exercise.    . Anemia   . Bruises easily   . Blind     both eyes removed   . Family history of breast cancer     mother  . Cellulitis late 1980's    "hospitalized; wrapped both legs; several times; no OR for this"  . Peripheral vascular disease ([place])   . Colon polyps   . HYPERTENSION 01/27/2009    BP low  . Hypotension   . Pneumonia 2000;s X 1  . DM type 2 (diabetes mellitus, type 2) ([place])     no medications (07/28/2014)  . Arthritis     "back, neck" (07/28/2014)  . ESRD (end stage renal disease) on dialysis [facility])     [name]; Mon, Wed, Fri (07/28/2014)  . Renal insufficiency   . Prostate cancer ([place])   . Renal cell carcinoma 2001 and 2003    "both kidneys"   Past Surgical History  Procedure Laterality Date  . Foot amputation Right ~ 2002    right;  partial; "infection"  . Colonoscopy  06/14/2011    Procedure: COLONOSCOPY;  Surgeon: [name], MD;  Location: [facility] ENDOSCOPY;  Service: Endoscopy;  Laterality: N/A;  . Hot hemostasis  06/14/2011    Procedure: HOT HEMOSTASIS (ARGON PLASMA COAGULATION/BICAP);  Surgeon: [name], MD;  Location: [facility];  Service: Endoscopy;  Laterality: N/A;  . Av fistula placement Left 02/2010    LFA  . Varicose vein surgery  mid 1980's     BLE; "knees down; both legs; 2 separate times"  . Radiofrequency ablation kidney  2010-2012    "  twice; one on each side; for cancer"  . Coronary artery bypass graft  09/29/2011    Procedure: CORONARY ARTERY BYPASS GRAFTING (CABG);  Surgeon: [name], MD;  Location: [employer] [facility];  Service: Open Heart Surgery;  Laterality: N/A;  Coronary Artery Bypass Graft times two utilizing the left internal mammary artery and the right greater saphenous vein harvested endoscopically.  . Colonoscopy N/A 07/24/2012    Procedure: COLONOSCOPY;  Surgeon: [name], MD;  Location: [facility] ENDOSCOPY;  Service: Endoscopy;  Laterality: N/A;  . Hip pinning,cannulated Left 12/31/2013    Procedure: CANNULATED HIP  PINNING;  Surgeon: Renette Butters, MD;  Location: [place];  Service: Orthopedics;  Laterality: Left;  Carm, FX Table, [employer]  . Left heart catheterization with coronary angiogram N/A 09/27/2011    Procedure: LEFT HEART CATHETERIZATION WITH CORONARY ANGIOGRAM;  Surgeon: [name], MD;  Location: [facility] CATH LAB;  Service: Cardiovascular;  Laterality: N/A;  . Colonoscopy with propofol N/A 05/13/2014    Procedure: COLONOSCOPY WITH PROPOFOL;  Surgeon: [name], MD;  Location: [facility] ENDOSCOPY;  Service: Endoscopy;  Laterality: N/A;  . Colonoscopy    . Enucleation  2003; 2006    bilateral; "diabetes; pain"  . Fracture surgery    . Cholecystectomy open  1992  . Insertion prostate radiation seed  03/2012  . Cystoscopy with retrograde pyelogram, ureteroscopy and stent placement Left 07/28/2014    Procedure: CYSTOSCOPY WITH RETROGRADE PYELOGRAM, URETERAL BALLOON DILITATION, URETEROSCOPY AND LEFT STENT PLACEMENT;  Surgeon: [name], MD;  Location: [facility] ORS;  Service: Urology;  Laterality: Left;  . Ligation of competing branches of arteriovenous fistula Left 07/29/2014    Procedure: LIGATION OF COMPETING BRANCHES OF LEFT ARM ARTERIOVENOUS FISTULA;  Surgeon: [name], MD;  Location: [facility] OR;  Service: Vascular;  Laterality: Left;   Family History  Problem Relation Age of Onset  . Cancer    . Coronary artery disease    . Kidney disease    . Breast cancer    . Ovarian cancer    . Lung cancer    . Diabetes    . Cancer Mother 19    breast, spine and ovarian  . Heart disease Mother   . Hyperlipidemia Mother   . Hypertension Mother   . Varicose Veins Mother   . Emphysema Father   . Cancer Father 71    kidney, prostate  . Heart disease Father   . Hyperlipidemia Father   . Hypertension Father   . Cancer Sister     ovarian  . Cancer Brother     lung  . Heart disease Brother   . Hyperlipidemia Brother   . Hypertension Brother   . Heart attack Brother   . Peripheral vascular disease  Brother   . Malignant hyperthermia Neg Hx    Social History:  reports that he has never smoked. He has never used smokeless tobacco. He reports that he does not drink alcohol or use illicit drugs. Allergies  Allergen Reactions  . Codeine Other (See Comments)    Makes patient incoherent. STATES MAKES HIM COMATOSE  . Tape Other (See Comments)    Plastic tape tears skin off, please use paper tape instead.   Prior to Admission medications   Medication Sig Start Date End Date Taking? Authorizing Provider  aspirin 81 MG tablet Take 81 mg by mouth daily.   Yes Historical Provider, MD  atorvastatin (LIPITOR) 10 MG tablet TAKE 1 TABLET BY MOUTH EVERY DAY 05/13/14  Yes  Thayer Headings, MD  b complex-vitamin c-folic acid (NEPHRO-VITE) 0.8 MG TABS Take 0.8 mg by mouth at bedtime.   Yes Historical Provider, MD  glimepiride (AMARYL) 2 MG tablet Take 2 mg by mouth daily with breakfast.   Yes Historical Provider, MD  Methoxy PEG-Epoetin Beta (MIRCERA) 50 MCG/0.3ML SOSY Inject 50 mcg as directed every dialysis.   Yes Historical Provider, MD  midodrine (PROAMATINE) 10 MG tablet Take 10 mg by mouth 2 (two) times daily.   Yes Historical Provider, MD  sevelamer carbonate (RENVELA) 800 MG tablet Take 4,000 mg by mouth 3 (three) times daily with meals.  06/26/14  Yes Historical Provider, MD  cefTAZidime 2 g in dextrose 5 % 50 mL Inject 2 g into the vein See admin instructions. At dialysis, 11/9-11/11    Historical Provider, MD   No current facility-administered medications for this encounter.   Current Outpatient Prescriptions  Medication Sig Dispense Refill  . aspirin 81 MG tablet Take 81 mg by mouth daily.    [name] atorvastatin (LIPITOR) 10 MG tablet TAKE 1 TABLET BY MOUTH EVERY DAY 30 tablet 6  . b complex-vitamin c-folic acid (NEPHRO-VITE) 0.8 MG TABS Take 0.8 mg by mouth at bedtime.    [name] glimepiride (AMARYL) 2 MG tablet Take 2 mg by mouth daily with breakfast.    . Methoxy PEG-Epoetin Beta (MIRCERA) 50  MCG/0.3ML SOSY Inject 50 mcg as directed every dialysis.    . midodrine (PROAMATINE) 10 MG tablet Take 10 mg by mouth 2 (two) times daily.    . sevelamer carbonate (RENVELA) 800 MG tablet Take 4,000 mg by mouth 3 (three) times daily with meals.     . cefTAZidime 2 g in dextrose 5 % 50 mL Inject 2 g into the vein See admin instructions. At dialysis, 11/9-11/11     Facility-Administered Medications Ordered in Other Encounters  Medication Dose Route Frequency Provider Last Rate Last Dose  . Chlorhexidine Gluconate Cloth 2 % PADS 6 each  6 each Topical Once [name], MD       Labs: Basic Metabolic Panel:  Recent Labs Lab 02/13/15 0818  NA 140  K 4.8  CL 96*  CO2 27  GLUCOSE 59*  BUN 69*  CREATININE 8.32*  CALCIUM 9.9   Liver Function Tests:  Recent Labs Lab 02/13/15 0818  AST 18  ALT 8*  ALKPHOS 79  BILITOT 0.5  PROT 6.0*  ALBUMIN 2.7*   No results for input(s): LIPASE, AMYLASE in the last 168 hours. No results for input(s): AMMONIA in the last 168 hours. CBC:  Recent Labs Lab 02/13/15 0818  WBC 6.6  NEUTROABS 6.0  HGB 8.6*  HCT 27.3*  MCV 100.7*  PLT 186   Cardiac Enzymes: No results for input(s): CKTOTAL, CKMB, CKMBINDEX, TROPONINI in the last 168 hours. CBG:  Recent Labs Lab 02/13/15 0754 02/13/15 0858 02/13/15 0957 02/13/15 1122  GLUCAP 59* 134* 128* 89   Iron Studies: No results for input(s): IRON, TIBC, TRANSFERRIN, FERRITIN in the last 72 hours. Studies/Results: Dg Chest 2 View  02/13/2015  CLINICAL DATA:  PICC line placement. EXAM: CHEST  2 VIEW COMPARISON:  12/30/2013 . FINDINGS: Right PICC line noted with tip in the lower superior vena cava . Prior median sternotomy and CABG. Heart size stable. No pulmonary venous congestion. Mild right base subsegmental atelectasis. No pleural effusion or pneumothorax. IMPRESSION: 1.  Right PICC line noted with tip in the lower superior vena cava. 2.  Right base subsegmental atelectasis. 3.  Prior CABG.   Heart size normal. Electronically Signed   By: Marcello Moores  Register   On: 02/13/2015 08:57    ROS: As per HPI otherwise negative.   Physical Exam: Filed Vitals:   02/13/15 1015 02/13/15 1115 02/13/15 1130 02/13/15 1139  BP: 113/71 109/52 103/67 103/67  Pulse: 67 64 68 66  Temp:      TempSrc:      Resp:    18  Height:      Weight:      SpO2: 96% 95% 93% 95%     General: Chronically ill appearing male no acute distress. Head: Normocephalic, atraumatic, sclera slightly icteric, mucus membranes are moist Neck: Supple. JVD not elevated. Lungs: Clear bilaterally to auscultation without wheezes, rales, or rhonchi. Breathing is unlabored. Heart: RRR with S1 S2. No murmurs, rubs, or gallops appreciated. Abdomen: stables intact RUQ, abdomen soft, non-tender with active BS.  M-S:  Strength and tone appear normal for age.  Lower extremities:Trace LE edema. No ischemic changes, no open wounds. Partial amputation of R foot.  Neuro: Alert and oriented X 3. Moves all extremities spontaneously. Psych:  Responds to questions appropriately with a normal affect. Dialysis Access: LFA AVF OLC green.  Dialysis Orders: Center: [facility]  On MWF. EDW 98 kg HD Bath 2.0K 2.0 Ca  Time 4 hours 15 minuters Heparin 9800 units per treatment . Access LFA AVF BFR 450 DFR 800 manual    Mircera 50 mcg IV q 2 weeks (Last dose 02/11/2015  Assessment/Plan: 1.  Hypoglycemia: Last HA1C 4.7 Will DC glimepiride. Primary following 2.  ESRD - MWF at [facility],. Will have HD today per schedule 3.  Hypertension/volume  - Continue midodrine as previously ordered. Wt 98.4. Will remove 1-1.5 liters 4.  Anemia  - HGB 8.6 received Mircera 50 MCG IV 02/11/2015. Will follow HGB. May need repeat dose of ESA.  5.  Metabolic bone disease -  On Renvela 800 mg 5 tablets PO TID. Consider decreasing. Ca 9.9. Will use 2.0 Ca bath 6.  Nutrition - Albumin 2.7Rena diet. Add nepro, renal vits.   Rita H. Owens Shark, NP-C 02/13/2015, 12:40 PM  [employer] [phone]

## 2015-02-13 NOTE — ED Notes (Signed)
Pt. Given [place] sandwich with admitting MD approval.

## 2015-02-13 NOTE — Progress Notes (Signed)
Hypoglycemic Event  CBG: 59  Treatment: 15 GM carbohydrate snack4 oz apple juice  Symptoms: None  Follow-up CBG: Time:2208 CBG Result:77  Possible Reasons for Event: Unknown  Comments/MD notified:no     Chrys Racer

## 2015-02-14 DIAGNOSIS — N186 End stage renal disease: Secondary | ICD-10-CM

## 2015-02-14 DIAGNOSIS — I2510 Atherosclerotic heart disease of native coronary artery without angina pectoris: Secondary | ICD-10-CM | POA: Diagnosis not present

## 2015-02-14 DIAGNOSIS — I959 Hypotension, unspecified: Secondary | ICD-10-CM

## 2015-02-14 DIAGNOSIS — H540 Blindness, both eyes: Secondary | ICD-10-CM | POA: Diagnosis not present

## 2015-02-14 DIAGNOSIS — E162 Hypoglycemia, unspecified: Secondary | ICD-10-CM | POA: Diagnosis not present

## 2015-02-14 DIAGNOSIS — Z992 Dependence on renal dialysis: Secondary | ICD-10-CM

## 2015-02-14 DIAGNOSIS — E11649 Type 2 diabetes mellitus with hypoglycemia without coma: Secondary | ICD-10-CM | POA: Diagnosis not present

## 2015-02-14 DIAGNOSIS — I9589 Other hypotension: Secondary | ICD-10-CM | POA: Diagnosis not present

## 2015-02-14 LAB — GLUCOSE, CAPILLARY
GLUCOSE-CAPILLARY: 12 mg/dL — AB (ref 65–99)
GLUCOSE-CAPILLARY: 83 mg/dL (ref 65–99)
GLUCOSE-CAPILLARY: 83 mg/dL (ref 65–99)
GLUCOSE-CAPILLARY: 135 mg/dL — AB (ref 65–99)
GLUCOSE-CAPILLARY: 54 mg/dL — AB (ref 65–99)
Glucose-Capillary: 115 mg/dL — ABNORMAL HIGH (ref 65–99)
Glucose-Capillary: 135 mg/dL — ABNORMAL HIGH (ref 65–99)
Glucose-Capillary: 177 mg/dL — ABNORMAL HIGH (ref 65–99)
Glucose-Capillary: 185 mg/dL — ABNORMAL HIGH (ref 65–99)
Glucose-Capillary: 205 mg/dL — ABNORMAL HIGH (ref 65–99)
Glucose-Capillary: 223 mg/dL — ABNORMAL HIGH (ref 65–99)
Glucose-Capillary: 24 mg/dL — CL (ref 65–99)
Glucose-Capillary: 73 mg/dL (ref 65–99)
Glucose-Capillary: 75 mg/dL (ref 65–99)
Glucose-Capillary: 79 mg/dL (ref 65–99)
Glucose-Capillary: 82 mg/dL (ref 65–99)
Glucose-Capillary: 83 mg/dL (ref 65–99)

## 2015-02-14 LAB — CBC
HCT: 24 % — ABNORMAL LOW (ref 39.0–52.0)
HEMOGLOBIN: 7.6 g/dL — AB (ref 13.0–17.0)
MCH: 31.9 pg (ref 26.0–34.0)
MCHC: 31.7 g/dL (ref 30.0–36.0)
MCV: 100.8 fL — ABNORMAL HIGH (ref 78.0–100.0)
PLATELETS: 176 10*3/uL (ref 150–400)
RBC: 2.38 MIL/uL — ABNORMAL LOW (ref 4.22–5.81)
RDW: 14.9 % (ref 11.5–15.5)
WBC: 5.1 10*3/uL (ref 4.0–10.5)

## 2015-02-14 LAB — BASIC METABOLIC PANEL
Anion gap: 10 (ref 5–15)
BUN: 35 mg/dL — ABNORMAL HIGH (ref 6–20)
CALCIUM: 8.9 mg/dL (ref 8.9–10.3)
CHLORIDE: 99 mmol/L — AB (ref 101–111)
CO2: 27 mmol/L (ref 22–32)
CREATININE: 5.3 mg/dL — AB (ref 0.61–1.24)
GFR calc Af Amer: 12 mL/min — ABNORMAL LOW (ref >60)
GFR, EST NON AFRICAN AMERICAN: 10 mL/min — AB (ref >60)
GLUCOSE: 90 mg/dL (ref 65–99)
Potassium: 4.5 mmol/L (ref 3.5–5.1)
SODIUM: 136 mmol/L (ref 135–145)

## 2015-02-14 LAB — HEMOGLOBIN A1C
Hgb A1c MFr Bld: 5.2 % (ref 4.8–5.6)
MEAN PLASMA GLUCOSE: 103 mg/dL

## 2015-02-14 MED ORDER — ALBUMIN HUMAN 25 % IV SOLN
25.0000 g | Freq: Once | INTRAVENOUS | Status: AC
Start: 2015-02-14 — End: 2015-02-14
  Administered 2015-02-14: 25 g via INTRAVENOUS
  Filled 2015-02-14: qty 100

## 2015-02-14 MED ORDER — DEXTROSE 50 % IV SOLN
INTRAVENOUS | Status: AC
  Administered 2015-02-14: 50 mL
  Filled 2015-02-14: qty 50

## 2015-02-14 MED ORDER — DEXTROSE 10 % IV SOLN
INTRAVENOUS | Status: AC
  Administered 2015-02-14: 04:00:00 via INTRAVENOUS

## 2015-02-14 MED ORDER — DEXTROSE 50 % IV SOLN
25.0000 mL | Freq: Once | INTRAVENOUS | Status: AC
  Administered 2015-02-14: 25 mL via INTRAVENOUS

## 2015-02-14 MED ORDER — DEXTROSE 50 % IV SOLN
INTRAVENOUS | Status: AC
  Filled 2015-02-14: qty 50

## 2015-02-14 NOTE — Progress Notes (Signed)
Hypoglycemic Event  CBG: 24  Treatment: D50 IV 25 mL  Symptoms: Sweaty  Follow-up CBG: [id number] CBG Result:79  Possible Reasons for Event: Unknown  Comments/MD notified:no    Chrys Racer

## 2015-02-14 NOTE — Progress Notes (Signed)
PT Cancellation Note  Patient Details Name: [name] MRN: [id number] DOB: 21-Jun-1950   Cancelled Treatment:    Reason Eval/Treat Not Completed: Medical issues which prohibited therapy (hypotensive).  Will check back later as time allows.   [name] Dial 02/14/2015, 11:08 AM   Mee Hives, PT MS Acute Rehab Dept. Number: [facility] [id number] and [place] [phone]

## 2015-02-14 NOTE — Care Management Obs Status (Signed)
[facility] NOTIFICATION   Patient Details  Name: NACHMAN GUTTERY MRN: [id number] Date of Birth: 1950-07-16   Medicare Observation Status Notification Given:  Yes (Spouse declined to take the orignal copy of the obs notice)    Apolonio Schneiders, RN 02/14/2015, 11:09 AM

## 2015-02-14 NOTE — Progress Notes (Signed)
PROGRESS NOTE  [name] [id number] DOB: 04/19/1950 DOA: 02/13/2015 PCP: [name], MD  Brief History 64 year old male with history of diabetes mellitus type 2, ESRD with dialysis on Monday, Wednesday, Friday, renal tumor recently resected 2 weeks ago, CAD, hypertension, anemia CKD, peripheral vascular disease status post right foot amputation presented with hypoglycemia. The patient was recently discharged from [facility] 10/28-11/10/2014 for removal of presumed R renal carcinoma.  Apparently, the patient was given intravenous antibiotics for gram-negative UTI. On the evening of 02/12/2015, the patient was noted to have a CBG of 40. EMS was called and the patient was given intravenous glucose with improvement. The patient went to dialysis at his outpatient Center where he was found to be lethargic. CBG checked and found him to have blood glucose of 40. As result, the patient was brought to emergency department. The patient last took his dose of Amaryl on 02/13/2015.  Assessment/Plan: Hypoglycemia -Secondary to Amaryl use in the setting of ESRD and variable oral intake -The patient last took Amaryl 02/13/2015 -Will not restart Amaryl at the time of discharge -02/13/2059 hemoglobin A1c 5.2 -Would monitor the patient off all hypoglycemic agents--he will ultimately need close follow-up with his primary care provider -Continue monitor CBGs every 4 hours Hypotension -Patient's systolic blood pressure normally runs 80-90 -Case was discussed with Dr. Posey Pronto -albumin dose to be given -Continue midodrine Diabetes mellitus type 2  -As discussed above, monitor patient off of oral hypoglycemic agents  ESRD -Appreciate nephrology follow-up -Dialysis on Monday, Wednesday, Fridays -Phosphorous binders per nephrology Anemia CKD  -Low hemoglobin likely a reflection of the patient's recent surgery/surgical losses  -Monitor hemoglobins  Hyperlipidemia  -Continue atorvastatin    CAD  -Status post CABG 2013  -No anginal symptoms presently  -Continue aspirin     Family Communication:   Wife updated at bedside Disposition Plan:   Home when medically stable       Procedures/Studies: Dg Chest 2 View  02/13/2015  CLINICAL DATA:  PICC line placement. EXAM: CHEST  2 VIEW COMPARISON:  12/30/2013 . FINDINGS: Right PICC line noted with tip in the lower superior vena cava . Prior median sternotomy and CABG. Heart size stable. No pulmonary venous congestion. Mild right base subsegmental atelectasis. No pleural effusion or pneumothorax. IMPRESSION: 1.  Right PICC line noted with tip in the lower superior vena cava. 2.  Right base subsegmental atelectasis. 3.  Prior CABG.  Heart size normal. Electronically Signed   By: Marcello Moores  Register   On: 02/13/2015 08:57         Subjective: Patient denies fevers, chills, headache, chest pain, dyspnea, nausea, vomiting, diarrhea, abdominal pain, dysuria, hematuria   Objective: Filed Vitals:   02/14/15 0300 02/14/15 0403 02/14/15 1004 02/14/15 1028  BP:  99/51 72/46 80/42   Pulse:  75 75   Temp:  98.4 F (36.9 C) 98.1 F (36.7 C)   TempSrc:   Oral   Resp:  23 20   Height:      Weight:  97.5 kg (214 lb 15.2 oz)    SpO2: 97%  99%     Intake/Output Summary (Last 24 hours) at 02/14/15 1043 Last data filed at 02/14/15 0930  Gross per 24 hour  Intake    720 ml  Output    803 ml  Net    -83 ml   Weight change:  Exam:   General:  Pt is alert, follows commands appropriately, not in  acute distress  HEENT: No icterus, No thrush, No neck mass, [place]/AT  Cardiovascular: RRR, S1/S2, no rubs, no gallops  Respiratory: CTA bilaterally, no wheezing, no crackles, no rhonchi  Abdomen: Soft/+BS, non tender, non distended, no guarding  Extremities: No edema, No lymphangitis, No petechiae, No rashes, no synovitis  Data Reviewed: Basic Metabolic Panel:  Recent Labs Lab 02/13/15 0818 02/14/15 0551  NA 140 136  K 4.8 4.5   CL 96* 99*  CO2 27 27  GLUCOSE 59* 90  BUN 69* 35*  CREATININE 8.32* 5.30*  CALCIUM 9.9 8.9   Liver Function Tests:  Recent Labs Lab 02/13/15 0818  AST 18  ALT 8*  ALKPHOS 79  BILITOT 0.5  PROT 6.0*  ALBUMIN 2.7*   No results for input(s): LIPASE, AMYLASE in the last 168 hours. No results for input(s): AMMONIA in the last 168 hours. CBC:  Recent Labs Lab 02/13/15 0818 02/14/15 0551  WBC 6.6 5.1  NEUTROABS 6.0  --   HGB 8.6* 7.6*  HCT 27.3* 24.0*  MCV 100.7* 100.8*  PLT 186 176   Cardiac Enzymes: No results for input(s): CKTOTAL, CKMB, CKMBINDEX, TROPONINI in the last 168 hours. BNP: Invalid input(s): POCBNP CBG:  Recent Labs Lab 02/14/15 0537 02/14/15 0638 02/14/15 0705 02/14/15 0806 02/14/15 0948  GLUCAP 83 54* 135* 115* 83    No results found for this or any previous visit (from the past 240 hour(s)).   Scheduled Meds: . aspirin EC  81 mg Oral Daily  . atorvastatin  10 mg Oral QHS  . feeding supplement (NEPRO CARB STEADY)  237 mL Oral BID BM  . heparin  5,000 Units Subcutaneous 3 times per day  . midodrine  10 mg Oral BID  . multivitamin  1 tablet Oral QHS  . sevelamer carbonate  4,000 mg Oral TID WC   Continuous Infusions:    Nicholas Ludlam, DO  [facility] Pager [phone]  If 7PM-7AM, please contact night-coverage [email or web address] Password [facility] 02/14/2015, 10:43 AM

## 2015-02-14 NOTE — Progress Notes (Signed)
Hypoglycemic Event  CBG:12  Treatment: D50 IV 50 mL  Symptoms: Pale  Follow-up CBG: Time:0408 CBG Result:83   Possible Reasons for Event: Unknown  Comments/MD notified:Tom Rogue Bussing NP/ Rapid response nurse.  Also gave 1 can of nepro after amp of D50%.  IVF changed to D10 at 50cc/hr    Chrys Racer

## 2015-02-14 NOTE — Progress Notes (Signed)
Pt. BP was 72/46. Gven 10mg  Midodrine and BP rose to 80/42 manual. MD notified. Will continue to monitor.  Alex Gardener, RN

## 2015-02-14 NOTE — Progress Notes (Signed)
Hypoglycemic Event  CBG: 58   Treatment: D50 IV 50 mL  Symptoms: None  Follow-up CBG: Time:0703  CBG Result:135  Possible Reasons for Event: Unknown  Comments/MD notified:no 1 can of nepro also given with amp of d50    [name]

## 2015-02-14 NOTE — Progress Notes (Signed)
Patient ID: [name], male   DOB: 1950/10/25, 64 y.o.   MRN: [id number]  [facility] Progress Note   Assessment/ Plan:   1. Hypoglycemia: Unfortunately, appears to be iatrogenic from glimepiride-this discontinued on admission however he had only taken a dose yesterday morning and expect effects to last 24-hour since then explaining hypoglycemia overnight. Continue to monitor off of oral hypoglycemic agents. Last HA1C 4.7% 2. ESRD - MWF at [facility],. Underwent hemodialysis yesterday-completed about 3 hours and 45 minutes prior to system clotting and not restarted thereafter. No acute dialysis needs today-next dialysis Monday 3. Hypertension/volume - Continue midodrine as previously ordered. Appears to be close to his dry weight-97.5 kg today 4. Anemia - low hemoglobin noted-likely reflective of his recent surgery/surgical losses, continue to monitor for transfusion triggers and continue ASA..  5. Metabolic bone disease - phosphorus binders resumed at outpatient doses.  6. Nutrition - low albumin likely reflective of recent surgery/acute phase reaction-continue renal diet with protein supplementation   Subjective:   With symptomatic hypoglycemia overnight that was treated successfully.    Objective:   BP 99/51 mmHg  Pulse 75  Temp(Src) 98.4 F (36.9 C) (Oral)  Resp 23  Ht 6\' 4"  (1.93 m)  Wt 97.5 kg (214 lb 15.2 oz)  BMI 26.18 kg/m2  SpO2 97%  Physical Exam: Gen: resting comfortably in bed CVS: pulse regular in rate and rhythm, S1 and S2 normal  Resp: Coarse breath sounds bilaterally-no distinct rales or rhonchi  Abd: soft, nontender, bowel sounds normal  Ext: no appreciable lower extremity edema  Labs: BMET  Recent Labs Lab 02/13/15 0818 02/14/15 0551  NA 140 136  K 4.8 4.5  CL 96* 99*  CO2 27 27  GLUCOSE 59* 90  BUN 69* 35*  CREATININE 8.32* 5.30*  CALCIUM 9.9 8.9   CBC  Recent Labs Lab 02/13/15 0818 02/14/15 0551  WBC 6.6 5.1  NEUTROABS 6.0   --   HGB 8.6* 7.6*  HCT 27.3* 24.0*  MCV 100.7* 100.8*  PLT 186 176   Medications:    . aspirin EC  81 mg Oral Daily  . atorvastatin  10 mg Oral QHS  . feeding supplement (NEPRO CARB STEADY)  237 mL Oral BID BM  . heparin  5,000 Units Subcutaneous 3 times per day  . midodrine  10 mg Oral BID  . multivitamin  1 tablet Oral QHS  . sevelamer carbonate  4,000 mg Oral TID WC   Elmarie Shiley, MD 02/14/2015, 9:20 AM

## 2015-02-15 DIAGNOSIS — I441 Atrioventricular block, second degree: Secondary | ICD-10-CM

## 2015-02-15 DIAGNOSIS — E11649 Type 2 diabetes mellitus with hypoglycemia without coma: Secondary | ICD-10-CM | POA: Diagnosis not present

## 2015-02-15 DIAGNOSIS — I2510 Atherosclerotic heart disease of native coronary artery without angina pectoris: Secondary | ICD-10-CM | POA: Diagnosis not present

## 2015-02-15 DIAGNOSIS — I120 Hypertensive chronic kidney disease with stage 5 chronic kidney disease or end stage renal disease: Secondary | ICD-10-CM | POA: Diagnosis not present

## 2015-02-15 DIAGNOSIS — E162 Hypoglycemia, unspecified: Secondary | ICD-10-CM | POA: Diagnosis not present

## 2015-02-15 DIAGNOSIS — N186 End stage renal disease: Secondary | ICD-10-CM | POA: Diagnosis not present

## 2015-02-15 DIAGNOSIS — I9589 Other hypotension: Secondary | ICD-10-CM | POA: Diagnosis not present

## 2015-02-15 DIAGNOSIS — E1122 Type 2 diabetes mellitus with diabetic chronic kidney disease: Secondary | ICD-10-CM | POA: Diagnosis not present

## 2015-02-15 LAB — GLUCOSE, CAPILLARY
GLUCOSE-CAPILLARY: 166 mg/dL — AB (ref 65–99)
GLUCOSE-CAPILLARY: 140 mg/dL — AB (ref 65–99)
GLUCOSE-CAPILLARY: 203 mg/dL — AB (ref 65–99)
Glucose-Capillary: 105 mg/dL — ABNORMAL HIGH (ref 65–99)
Glucose-Capillary: 123 mg/dL — ABNORMAL HIGH (ref 65–99)
Glucose-Capillary: 139 mg/dL — ABNORMAL HIGH (ref 65–99)
Glucose-Capillary: 202 mg/dL — ABNORMAL HIGH (ref 65–99)

## 2015-02-15 LAB — CBC
HEMATOCRIT: 22.2 % — AB (ref 39.0–52.0)
Hemoglobin: 7 g/dL — ABNORMAL LOW (ref 13.0–17.0)
MCH: 31.5 pg (ref 26.0–34.0)
MCHC: 31.5 g/dL (ref 30.0–36.0)
MCV: 100 fL (ref 78.0–100.0)
Platelets: 165 10*3/uL (ref 150–400)
RBC: 2.22 MIL/uL — AB (ref 4.22–5.81)
RDW: 14.8 % (ref 11.5–15.5)
WBC: 4.1 10*3/uL (ref 4.0–10.5)

## 2015-02-15 LAB — PREPARE RBC (CROSSMATCH)

## 2015-02-15 MED ORDER — SODIUM CHLORIDE 0.9 % IV SOLN: Freq: Once | INTRAVENOUS | Status: DC

## 2015-02-15 MED ORDER — RENA-VITE PO TABS: 1.0000 | ORAL_TABLET | Freq: Every day | ORAL | Status: DC

## 2015-02-15 MED ORDER — NEPRO/CARBSTEADY PO LIQD: 237.0000 mL | Freq: Two times a day (BID) | ORAL | Status: DC

## 2015-02-15 MED ORDER — SODIUM CHLORIDE 0.9 % IV SOLN
Freq: Once | INTRAVENOUS | Status: AC
  Administered 2015-02-15: 23:00:00 via INTRAVENOUS

## 2015-02-15 NOTE — Progress Notes (Signed)
PROGRESS NOTE  [name] [id number] DOB: 04-10-1950 DOA: 02/13/2015 PCP: [name], MD Brief History 64 year old male with history of diabetes mellitus type 2, ESRD with dialysis on Monday, Wednesday, Friday, renal tumor recently resected 2 weeks ago, CAD, hypertension, anemia CKD, peripheral vascular disease status post right foot amputation presented with hypoglycemia. The patient was recently discharged from [facility] 10/28-11/10/2014 for removal of presumed R renal carcinoma. Apparently, the patient was given intravenous antibiotics for gram-negative UTI. On the evening of 02/12/2015, the patient was noted to have a CBG of 40. EMS was called and the patient was given intravenous glucose with improvement. The patient went to dialysis at his outpatient Center where he was found to be lethargic. CBG checked and found him to have blood glucose of 40. As result, the patient was brought to emergency department. The patient last took his dose of Amaryl on 02/13/2015. Assessment/Plan: Hypoglycemia -Secondary to Amaryl use in the setting of ESRD and variable oral intake -The patient last took Amaryl 02/13/2015 -Will not restart Amaryl at the time of discharge -02/13/2015 hemoglobin A1c 5.2 -Would monitor the patient off all hypoglycemic agents--he will ultimately need close follow-up with his primary care provider -CBGs Improved after 24-36 hours Hypotension -Patient's systolic blood pressure normally runs 80-90 -Case was discussed with Dr. Posey Pronto -albumin dose given with improvement -PRBC one unit ordered 02/16/15 -Continue midodrine Diabetes mellitus type 2  -As discussed above, monitor patient off of oral hypoglycemic agents  First Degree AV block/Wenchbach AV block -pt is asymptomatic -avoid AV nodal blocking agents at this time -Case was discussed with cardiology, Dr. Lenord Carbo the patient is hemodynamically stable and tolerating dialysis without any problems  without any long pauses, would continue observation and avoid AV nodal blocking agents ESRD -Appreciate nephrology follow-up -Dialysis on Monday, Wednesday, Fridays -Phosphorous binders per nephrology Anemia CKD /Symptomatic anemia -Low hemoglobin likely a reflection of the patient's recent surgery/surgical losses-pt required 4 units PRBC at [facility]  -Monitor hemoglobins  Renal cell carcinoma -Patient is status post cryoablation 2 -Left-sided radical nephrectomy 01/22/2015 at [facility] Hyperlipidemia  -Continue atorvastatin  CAD  -Status post CABG 2013  -No anginal symptoms presently  -Continue aspirin   Family Communication:   Wife updated at beside Disposition Plan:   Home 02/16/15 after dialysis      Procedures/Studies: Dg Chest 2 View  02/13/2015  CLINICAL DATA:  PICC line placement. EXAM: CHEST  2 VIEW COMPARISON:  12/30/2013 . FINDINGS: Right PICC line noted with tip in the lower superior vena cava . Prior median sternotomy and CABG. Heart size stable. No pulmonary venous congestion. Mild right base subsegmental atelectasis. No pleural effusion or pneumothorax. IMPRESSION: 1.  Right PICC line noted with tip in the lower superior vena cava. 2.  Right base subsegmental atelectasis. 3.  Prior CABG.  Heart size normal. Electronically Signed   By: Marcello Moores  Register   On: 02/13/2015 08:57         Subjective: Patient denies fevers, chills, headache, chest pain, dyspnea, nausea, vomiting, diarrhea, abdominal pain, dysuria, hematuria   Objective: Filed Vitals:   02/14/15 2140 02/15/15 0405 02/15/15 0500 02/15/15 0619  BP: 109/56 113/63  108/44  Pulse: 80 65  62  Temp: 99 F (37.2 C) 99.3 F (37.4 C)  98.4 F (36.9 C)  TempSrc: Oral Oral  Oral  Resp:  18  18  Height:      Weight:   98.2 kg (216  lb 7.9 oz)   SpO2: 97% 97%  100%    Intake/Output Summary (Last 24 hours) at 02/15/15 1711 Last data filed at 02/15/15 1446  Gross per 24 hour  Intake    360 ml   Output      0 ml  Net    360 ml   Weight change: -0.23 kg (-8.1 oz) Exam:   General:  Pt is alert, follows commands appropriately, not in acute distress  HEENT: No icterus, No thrush, No neck mass, [place]/AT  Cardiovascular: RRR, S1/S2, no rubs, no gallops  Respiratory: CTA bilaterally, no wheezing, no crackles, no rhonchi  Abdomen: Soft/+BS, non tender, non distended, no guarding  Extremities: No edema, No lymphangitis, No petechiae, No rashes, no synovitis  Data Reviewed: Basic Metabolic Panel:  Recent Labs Lab 02/13/15 0818 02/14/15 0551  NA 140 136  K 4.8 4.5  CL 96* 99*  CO2 27 27  GLUCOSE 59* 90  BUN 69* 35*  CREATININE 8.32* 5.30*  CALCIUM 9.9 8.9   Liver Function Tests:  Recent Labs Lab 02/13/15 0818  AST 18  ALT 8*  ALKPHOS 79  BILITOT 0.5  PROT 6.0*  ALBUMIN 2.7*   No results for input(s): LIPASE, AMYLASE in the last 168 hours. No results for input(s): AMMONIA in the last 168 hours. CBC:  Recent Labs Lab 02/13/15 0818 02/14/15 0551 02/15/15 0525  WBC 6.6 5.1 4.1  NEUTROABS 6.0  --   --   HGB 8.6* 7.6* 7.0*  HCT 27.3* 24.0* 22.2*  MCV 100.7* 100.8* 100.0  PLT 186 176 165   Cardiac Enzymes: No results for input(s): CKTOTAL, CKMB, CKMBINDEX, TROPONINI in the last 168 hours. BNP: Invalid input(s): POCBNP CBG:  Recent Labs Lab 02/15/15 0201 02/15/15 0417 02/15/15 0737 02/15/15 1152 02/15/15 1655  GLUCAP 166* 140* 105* 123* 139*    No results found for this or any previous visit (from the past 240 hour(s)).   Scheduled Meds: . sodium chloride   Intravenous Once  . aspirin EC  81 mg Oral Daily  . atorvastatin  10 mg Oral QHS  . feeding supplement (NEPRO CARB STEADY)  237 mL Oral BID BM  . heparin  5,000 Units Subcutaneous 3 times per day  . midodrine  10 mg Oral BID  . multivitamin  1 tablet Oral QHS  . sevelamer carbonate  4,000 mg Oral TID WC   Continuous Infusions:    Lorenia Hoston, DO  [facility] Pager  [phone]  If 7PM-7AM, please contact night-coverage [email or web address] Password [email or web address] 02/15/2015, 5:11 PM

## 2015-02-15 NOTE — Progress Notes (Signed)
Patient ID: [name], male   DOB: 10-29-1950, 64 y.o.   MRN: [id number]  [facility] Progress Note   Assessment/ Plan:   1. Hypoglycemia: Iatrogenic from glimepiride-no further hypoglycemia overnight following washout over the past 48 hours. Continue to monitor off of oral hypoglycemic agents. Last HA1C 4.7% 2. ESRD - MWF at [facility],. Without acute dialysis needs at this time-next hemodialysis treatment due tomorrow 3. Hypertension/volume - Continue midodrine as previously ordered. Appears to be close to his dry weight-97.5 kg today 4. Anemia - low hemoglobin noted-likely reflective of his recent surgery/surgical losses, agree with plans for blood transfusion today prior to discharge.  5. Metabolic bone disease - phosphorus binders resumed at outpatient doses.  6. Nutrition - low albumin likely reflective of recent surgery/acute phase reaction-continue renal diet with protein supplementation   Subjective:   Overnight events noted with regards to telemetry changes-discussed further with Dr. Carles Collet.    Objective:   BP 108/44 mmHg  Pulse 62  Temp(Src) 98.4 F (36.9 C) (Oral)  Resp 18  Ht 6\' 4"  (1.93 m)  Wt 98.2 kg (216 lb 7.9 oz)  BMI 26.36 kg/m2  SpO2 100%  Physical Exam: Gen: Eating breakfast, wife at bedside CVS: pulse regular in rate and rhythm, S1 and S2 normal  Resp: Coarse breath sounds bilaterally-no distinct rales or rhonchi  Abd: soft, nontender, bowel sounds normal  Ext: no appreciable lower extremity edema  Labs: BMET  Recent Labs Lab 02/13/15 0818 02/14/15 0551  NA 140 136  K 4.8 4.5  CL 96* 99*  CO2 27 27  GLUCOSE 59* 90  BUN 69* 35*  CREATININE 8.32* 5.30*  CALCIUM 9.9 8.9   CBC  Recent Labs Lab 02/13/15 0818 02/14/15 0551 02/15/15 0525  WBC 6.6 5.1 4.1  NEUTROABS 6.0  --   --   HGB 8.6* 7.6* 7.0*  HCT 27.3* 24.0* 22.2*  MCV 100.7* 100.8* 100.0  PLT 186 176 165   Medications:    . sodium chloride   Intravenous Once  .  aspirin EC  81 mg Oral Daily  . atorvastatin  10 mg Oral QHS  . feeding supplement (NEPRO CARB STEADY)  237 mL Oral BID BM  . heparin  5,000 Units Subcutaneous 3 times per day  . midodrine  10 mg Oral BID  . multivitamin  1 tablet Oral QHS  . sevelamer carbonate  4,000 mg Oral TID WC   Elmarie Shiley, MD 02/15/2015, 9:53 AM

## 2015-02-15 NOTE — Discharge Summary (Signed)
Physician Discharge Summary  [name] [id number] DOB: December 06, 1950 DOA: 02/13/2015  PCP: [name], MD  Admit date: 02/13/2015 Discharge date: 02/16/15 Recommendations for Outpatient Follow-up:  1. Pt will need to follow up with PCP in 2 weeks post discharge   Discharge Diagnoses:  Hypoglycemia -Secondary to Amaryl use in the setting of ESRD and variable oral intake -The patient last took Amaryl 02/13/2015 -Will not restart Amaryl at the time of discharge -02/13/2015 hemoglobin A1c 5.2 -Would monitor the patient off all hypoglycemic agents--he will ultimately need close follow-up with his primary care provider -CBGs Improved after 24-36 hours without any further hypoglycemia Hypotension -Patient's systolic blood pressure normally runs 80-90 -Case was discussed with Dr. Posey Pronto -albumin dose given with improvement -PRBC one unit given also helped -Continue midodrine -Hgb 8.9 on day of d/c Diabetes mellitus type 2  -As discussed above, monitor patient off of oral hypoglycemic agents  First Degree AV block/Wenchbach AV block -pt is asymptomatic -avoid AV nodal blocking agents at this time -Case was discussed with cardiology, Dr. Lenord Carbo the patient is hemodynamically stable and tolerating dialysis without any problems without any long pauses, would continue observation and avoid AV nodal blocking agents ESRD -Appreciate nephrology follow-up -Dialysis on Monday, Wednesday, Fridays -Phosphorous binders per nephrology Anemia CKD /Symptomatic anemia -Low hemoglobin likely a reflection of the patient's recent surgery/surgical losses-pt required 4 units PRBC at [facility]  -Monitor hemoglobins  -PRBC one unit given also helped -Hgb 8.9 on day of d/c Renal cell carcinoma -Patient is status post cryoablation 2 -Left-sided radical nephrectomy 01/22/2015 at [facility] Hyperlipidemia  -Continue atorvastatin  CAD  -Status post CABG 2013  -No anginal symptoms presently   -Continue aspirin      Discharge Condition: stable  Disposition:      Follow-up Information    Follow up with [facility].   Specialty:  Home Health Services   Why:  Resume HHRN, HHPT, HHOT   Contact information:   [address] [place] [address] [phone]     home  Diet:renal with fluid restriction Wt Readings from Last 3 Encounters:  02/16/15 105 kg (231 lb 7.7 oz)  10/01/14 97.523 kg (215 lb)  08/14/14 101.152 kg (223 lb)    History of present illness:  64 year old male with history of diabetes mellitus type 2, ESRD with dialysis on Monday, Wednesday, Friday, renal tumor recently resected 2 weeks ago, CAD, hypertension, anemia CKD, peripheral vascular disease status post right foot amputation presented with hypoglycemia. The patient was recently discharged from [facility] 10/28-11/10/2014 for removal of presumed R renal carcinoma. Apparently, the patient was given intravenous antibiotics for gram-negative UTI. On the evening of 02/12/2015, the patient was noted to have a CBG of 40. EMS was called and the patient was given intravenous glucose with improvement. The patient went to dialysis at his outpatient Center where he was found to be lethargic. CBG checked and found him to have blood glucose of 40. As result, the patient was brought to emergency department. The patient last took his dose of Amaryl on 02/13/2015. Initially, the patient was placed on a D5 W drip. As his CBGs improved without any further hypoglycemia, his D5W was discontinued. The patient remained normoglycemic. His hemoglobin A1c was 5.2.  The patient will not be restarted on any oral hypoglycemic agents at the time of discharge. The patient was noted to have first-degree AV block and occasional Wenckebach AV block. He was asymptomatic. AV nodal blocking agents were  avoided. The patient was intermittently hypotensive. However, the patient has a history of borderline low  pressure with systolic blood pressure normally running 80-90. The patient was given a dose of albumin with improvement of his blood pressures. He was continued on midodrine. The case was discussed with cardiology who did not feel that the patient needed any acute intervention at this time.   Consultants: nephrology  Discharge Exam: Filed Vitals:   02/16/15 1100  BP: 90/53  Pulse: 57  Temp:   Resp: 15   Filed Vitals:   02/16/15 0900 02/16/15 0930 02/16/15 1030 02/16/15 1100  BP: 95/54 87/46 91/54  90/53  Pulse: 62 62 62 57  Temp:      TempSrc:      Resp: 16 17 18 15   Height:      Weight:      SpO2:       General: A&O x 3, NAD, pleasant, cooperative Cardiovascular: RRR, no rub, no gallop, no S3 Respiratory: CTAB, no wheeze, no rhonchi Abdomen:soft, nontender, nondistended, positive bowel sounds Extremities: No edema, No lymphangitis, no petechiae  Discharge Instructions     Medication List    STOP taking these medications        cefTAZidime 2 g in dextrose 5 % 50 mL     glimepiride 2 MG tablet  Commonly known as:  AMARYL      TAKE these medications        aspirin 81 MG tablet  Take 81 mg by mouth daily.     atorvastatin 10 MG tablet  Commonly known as:  LIPITOR  TAKE 1 TABLET BY MOUTH EVERY DAY     b complex-vitamin c-folic acid 0.8 MG Tabs tablet  Take 0.8 mg by mouth at bedtime.     multivitamin Tabs tablet  Take 1 tablet by mouth at bedtime.     feeding supplement (NEPRO CARB STEADY) Liqd  Take 237 mLs by mouth 2 (two) times daily between meals.     midodrine 10 MG tablet  Commonly known as:  PROAMATINE  Take 10 mg by mouth 2 (two) times daily.     MIRCERA 50 MCG/0.3ML Sosy  Generic drug:  Methoxy PEG-Epoetin Beta  Inject 50 mcg as directed every dialysis.     sevelamer carbonate 800 MG tablet  Commonly known as:  RENVELA  Take 4,000 mg by mouth 3 (three) times daily with meals.         The results of significant diagnostics from this  hospitalization (including imaging, microbiology, ancillary and laboratory) are listed below for reference.    Significant Diagnostic Studies: Dg Chest 2 View  02/13/2015  CLINICAL DATA:  PICC line placement. EXAM: CHEST  2 VIEW COMPARISON:  12/30/2013 . FINDINGS: Right PICC line noted with tip in the lower superior vena cava . Prior median sternotomy and CABG. Heart size stable. No pulmonary venous congestion. Mild right base subsegmental atelectasis. No pleural effusion or pneumothorax. IMPRESSION: 1.  Right PICC line noted with tip in the lower superior vena cava. 2.  Right base subsegmental atelectasis. 3.  Prior CABG.  Heart size normal. Electronically Signed   By: Marcello Moores  Register   On: 02/13/2015 08:57     Microbiology: No results found for this or any previous visit (from the past 240 hour(s)).   Labs: Basic Metabolic Panel:  Recent Labs Lab 02/13/15 0818 02/14/15 0551 02/16/15 0549  NA 140 136 132*  K 4.8 4.5 5.5*  CL 96* 99* 97*  CO2 27 27 23  GLUCOSE 59* 90 117*  BUN 69* 35* 77*  CREATININE 8.32* 5.30* 8.51*  CALCIUM 9.9 8.9 9.5  PHOS  --   --  4.2   Liver Function Tests:  Recent Labs Lab 02/13/15 0818 02/16/15 0549  AST 18  --   ALT 8*  --   ALKPHOS 79  --   BILITOT 0.5  --   PROT 6.0*  --   ALBUMIN 2.7* 2.4*   No results for input(s): LIPASE, AMYLASE in the last 168 hours. No results for input(s): AMMONIA in the last 168 hours. CBC:  Recent Labs Lab 02/13/15 0818 02/14/15 0551 02/15/15 0525 02/16/15 0544  WBC 6.6 5.1 4.1 5.5  NEUTROABS 6.0  --   --   --   HGB 8.6* 7.6* 7.0* 8.9*  HCT 27.3* 24.0* 22.2* 27.5*  MCV 100.7* 100.8* 100.0 96.8  PLT 186 176 165 179   Cardiac Enzymes: No results for input(s): CKTOTAL, CKMB, CKMBINDEX, TROPONINI in the last 168 hours. BNP: Invalid input(s): POCBNP CBG:  Recent Labs Lab 02/15/15 0737 02/15/15 1152 02/15/15 1655 02/15/15 2222 02/16/15 0522  GLUCAP 105* 123* 139* 203* 98    Time coordinating  discharge:  Greater than 30 minutes  Signed:  Cabria Micalizzi, DO [facility] Pager: [phone] 02/16/2015, 11:10 AM

## 2015-02-15 NOTE — Progress Notes (Signed)
CCMD notified this RN of possible cardiac rhythm change from 1st degree AVB to 2nd degree AVB type 1. RN notified on-call NP Tom Callahan for EKG order to verify. New order received and implemented to obtain EKG. NP Callahan paged to review results. Patient has no c/o chest pain or discomfort. No new orders received regarding EKG results. Will continue to monitor.  

## 2015-02-16 DIAGNOSIS — E11649 Type 2 diabetes mellitus with hypoglycemia without coma: Secondary | ICD-10-CM | POA: Diagnosis not present

## 2015-02-16 DIAGNOSIS — H540 Blindness, both eyes: Secondary | ICD-10-CM | POA: Diagnosis not present

## 2015-02-16 DIAGNOSIS — N186 End stage renal disease: Secondary | ICD-10-CM | POA: Diagnosis not present

## 2015-02-16 DIAGNOSIS — I9589 Other hypotension: Secondary | ICD-10-CM | POA: Diagnosis not present

## 2015-02-16 DIAGNOSIS — E162 Hypoglycemia, unspecified: Secondary | ICD-10-CM | POA: Diagnosis not present

## 2015-02-16 LAB — CBC
HCT: 27.5 % — ABNORMAL LOW (ref 39.0–52.0)
HEMOGLOBIN: 8.9 g/dL — AB (ref 13.0–17.0)
MCH: 31.3 pg (ref 26.0–34.0)
MCHC: 32.4 g/dL (ref 30.0–36.0)
MCV: 96.8 fL (ref 78.0–100.0)
Platelets: 179 10*3/uL (ref 150–400)
RBC: 2.84 MIL/uL — AB (ref 4.22–5.81)
RDW: 15.3 % (ref 11.5–15.5)
WBC: 5.5 10*3/uL (ref 4.0–10.5)

## 2015-02-16 LAB — RENAL FUNCTION PANEL
ALBUMIN: 2.4 g/dL — AB (ref 3.5–5.0)
ANION GAP: 12 (ref 5–15)
BUN: 77 mg/dL — ABNORMAL HIGH (ref 6–20)
CALCIUM: 9.5 mg/dL (ref 8.9–10.3)
CO2: 23 mmol/L (ref 22–32)
CREATININE: 8.51 mg/dL — AB (ref 0.61–1.24)
Chloride: 97 mmol/L — ABNORMAL LOW (ref 101–111)
GFR, EST AFRICAN AMERICAN: 7 mL/min — AB (ref >60)
GFR, EST NON AFRICAN AMERICAN: 6 mL/min — AB (ref >60)
Glucose, Bld: 117 mg/dL — ABNORMAL HIGH (ref 65–99)
Phosphorus: 4.2 mg/dL (ref 2.5–4.6)
Potassium: 5.5 mmol/L — ABNORMAL HIGH (ref 3.5–5.1)
SODIUM: 132 mmol/L — AB (ref 135–145)

## 2015-02-16 LAB — TYPE AND SCREEN
ABO/RH(D): A POS
Antibody Screen: NEGATIVE
UNIT DIVISION: 0
Unit division: 0

## 2015-02-16 LAB — GLUCOSE, CAPILLARY
GLUCOSE-CAPILLARY: 99 mg/dL (ref 65–99)
GLUCOSE-CAPILLARY: 98 mg/dL (ref 65–99)
GLUCOSE-CAPILLARY: 122 mg/dL — AB (ref 65–99)
Glucose-Capillary: 27 mg/dL — CL (ref 65–99)

## 2015-02-16 NOTE — Evaluation (Signed)
Physical Therapy Evaluation Patient Details Name: [name] MRN: [id number] DOB: [birth year] Today's Date: 02/16/2015   History of Present Illness  Pt is a 64 y/o male with a PMH significant for blindness, DMII, ESRD HD MWF, renal cell carcinoma s/p nephrectomy 3 weeks ago, CAD, HTN. Pt presents to [facility] from the dialysis center with hypoglycemia.   Clinical Impression  Pt admitted with above diagnosis. Pt currently with functional limitations due to the deficits listed below (see PT Problem List). At the time of PT eval, pt reports being at his baseline of function. Confirmed throughout functional mobility that he felt he was at his baseline. Increased VC's required for direction with RW due to blindness, but was overall steady. It appears that pt has all necessary equipment at home, and pt reports that wife is available for 24 hour assist. Feel this pt could benefit from HHPT at d/c to maximize functional independence. Pt will benefit from skilled PT to increase their independence and safety with mobility to allow discharge to the venue listed below.       Follow Up Recommendations Home health PT;Supervision for mobility/OOB    Equipment Recommendations  None recommended by PT    Recommendations for Other Services       Precautions / Restrictions Precautions Precautions: Fall Precaution Comments: Pt is blind in both eyes Restrictions Weight Bearing Restrictions: No      Mobility  Bed Mobility               General bed mobility comments: Pt sitting up in recliner upon PT arrival.   Transfers Overall transfer level: Needs assistance Equipment used: Rolling walker (2 wheeled) Transfers: Sit to/from Stand Sit to Stand: Min guard         General transfer comment: Hands-on guarding with gait belt provided. Pt slow and guarded with standing but did not require assist to complete.   Ambulation/Gait Ambulation/Gait assistance: Min assist Ambulation Distance (Feet):  200 Feet Assistive device: Rolling walker (2 wheeled) Gait Pattern/deviations: Decreased stride length;Shuffle;Trunk flexed;Drifts right/left Gait velocity: Decreased Gait velocity interpretation: Below normal speed for age/gender General Gait Details: Pt requires max cueing for direction due to blindness. Overall steady, but listing mainly to the R. 2 standing rest breaks required.   Stairs Stairs:  (Deferred - pt has a ramp to enter)          Wheelchair Mobility    Modified Rankin (Stroke Patients Only)       Balance Overall balance assessment: Needs assistance Sitting-balance support: Feet supported;No upper extremity supported Sitting balance-Leahy Scale: Good     Standing balance support: No upper extremity supported Standing balance-Leahy Scale: Fair Standing balance comment: Able to stand without UE support before reaching back for chair to initiate stand>sit.                              Pertinent Vitals/Pain Pain Assessment: No/denies pain    Home Living Family/patient expects to be discharged to:: Private residence Living Arrangements: Spouse/significant other Available Help at Discharge: Family;Available 24 hours/day Type of Home: House Home Access: Ramped entrance     Home Layout: One level Home Equipment: Clinical cytogeneticist - 4 wheels;Bedside commode;Wheelchair - manual;Grab bars - toilet      Prior Function Level of Independence: Needs assistance   Gait / Transfers Assistance Needed: Rollator all the time  ADL's / Homemaking Assistance Needed: Wife asists with setting water temperature, washing back, and LB  dressing.   Comments: 7 days/week exercising     Hand Dominance   Dominant Hand: Left    Extremity/Trunk Assessment   Upper Extremity Assessment: Defer to OT evaluation           Lower Extremity Assessment: Generalized weakness      Cervical / Trunk Assessment: Kyphotic;Other exceptions  Communication    Communication: No difficulties  Cognition Arousal/Alertness: Awake/alert Behavior During Therapy: WFL for tasks assessed/performed Overall Cognitive Status: Within Functional Limits for tasks assessed                      General Comments      Exercises        Assessment/Plan    PT Assessment Patient needs continued PT services  PT Diagnosis Difficulty walking   PT Problem List Decreased strength;Decreased activity tolerance;Decreased balance;Decreased mobility;Decreased knowledge of use of DME;Decreased safety awareness;Decreased knowledge of precautions  PT Treatment Interventions DME instruction;Gait training;Stair training;Functional mobility training;Therapeutic activities;Therapeutic exercise;Neuromuscular re-education;Patient/family education   PT Goals (Current goals can be found in the Care Plan section) Acute Rehab PT Goals Patient Stated Goal: Home today PT Goal Formulation: With patient Time For Goal Achievement: 02/23/15 Potential to Achieve Goals: Good    Frequency Min 3X/week   Barriers to discharge        Co-evaluation               End of Session Equipment Utilized During Treatment: Gait belt Activity Tolerance: Patient tolerated treatment well Patient left: in chair;with call bell/phone within reach;with chair alarm set Nurse Communication: Mobility status    Functional Assessment Tool Used: Clinical judgement Functional Limitation: Mobility: Walking and moving around Mobility: Walking and Moving Around Current Status [phone]): At least 20 percent but less than 40 percent impaired, limited or restricted Mobility: Walking and Moving Around Goal Status [phone]): At least 20 percent but less than 40 percent impaired, limited or restricted    Time: 1255-1323 PT Time Calculation (min) (ACUTE ONLY): 28 min   Charges:   PT Evaluation $Initial PT Evaluation Tier I: 1 Procedure PT Treatments $Gait Training: 8-22 mins   PT G Codes:   PT  G-Codes **NOT FOR INPATIENT CLASS** Functional Assessment Tool Used: Clinical judgement Functional Limitation: Mobility: Walking and moving around Mobility: Walking and Moving Around Current Status [phone]): At least 20 percent but less than 40 percent impaired, limited or restricted Mobility: Walking and Moving Around Goal Status [phone]): At least 20 percent but less than 40 percent impaired, limited or restricted    Rolinda Roan 02/16/2015, 1:43 PM   Rolinda Roan, PT, DPT Acute Rehabilitation Services Pager: [phone]

## 2015-02-16 NOTE — Progress Notes (Signed)
Pt receiving unit of PRBCs upon RN arrival for shift. At 2030, it was found by another RN that unit was not adequately infusing. Primary RN and charge assessed pt and infusion. IV site lost. MD, Blood bank, and IV team notified of situation. Pt to receive another unit of PRBCs once new IV established since actual volume infused could not be determined.  Will continue to monitor patient.

## 2015-02-16 NOTE — Progress Notes (Signed)
Pharmacist Provided - Patient Medication Education Prior to Discharge   [name] is an 64 y.o. male who presented to [facility] on 02/13/2015 with a chief complaint of  Chief Complaint  Patient presents with  . Hypoglycemia     [x]  Patient will be discharged with 2 new medications []  Patient being discharged without any new medications  The following medications were discussed with the patient:   Pain Control medications: []  Yes    [x]  No  Diabetes Medications: []  Yes    [x]  No  Heart Failure Medications: []  Yes    [x]  No  Anticoagulation Medications:  []  Yes    [x]  No  Antibiotics at discharge: []  Yes    [x]  No  Allergy Assessment Completed and Updated: [x]  Yes    []  No Identified Patient Allergies:  Allergies  Allergen Reactions  . Codeine Other (See Comments)    Makes patient incoherent. STATES MAKES HIM COMATOSE  . Tape Other (See Comments)    Plastic tape tears skin off, please use paper tape instead.     Medication Adherence Assessment: []  Excellent (no doses missed/week)      [x]  Good (1 dose missed/week)      []  Partial (2-3 doses missed/week)      []  Poor (>3 doses missed/week)  Barriers to Obtaining Medications: []  Yes []  No  [name] expressed little concern with obtaining medication   Assessment: Pt is blind but spoke with him and his wife about changes in his medications. They are aware the amaryl will be stopped and that there will be a new multivitamin for him to take. We talked about what his other medications do and how to take them. He was most concerned with showering while we were visiting. His wife seems very comfortable with his medication regimen and was eager to learn about what they are doing.   Time spent preparing for discharge counseling: 15 mins Time spent counseling patient: [place], PharmD Clinical Pharmacy Resident Pager: [phone] 02/16/2015 2:03 PM

## 2015-02-16 NOTE — Progress Notes (Signed)
PT Cancellation Note  Patient Details Name: [name] MRN: [id number] DOB: 23-Jan-1951   Cancelled Treatment:    Reason Eval/Treat Not Completed: Patient at procedure or test/unavailable. Pt off unit for HD. Will check back as schedule allows to complete PT eval.    Rolinda Roan 02/16/2015, 8:32 AM   Rolinda Roan, PT, DPT Acute Rehabilitation Services Pager: [phone]

## 2015-02-16 NOTE — Progress Notes (Signed)
Spoke with patient's wife following patient's return from dialysis. Paged PT about seeing patient before discharge.   Alex Gardener, RN

## 2015-02-16 NOTE — Discharge Instructions (Signed)
Stop taking Amaryl at the time of discharge.

## 2015-02-16 NOTE — Care Management Note (Signed)
Case Management Note  Patient Details  Name: AUDRICK GENCO MRN: [id number] Date of Birth: [birth year]  Subjective/Objective:  Patient is from home with spouse, patient is active with [facility] for [place], and HHOT, will resume.  NCM notified Edwina with [facility] that patient is for dc today. They will resume care.  Patient is in HD now and will dc afterwards.                  Action/Plan:   Expected Discharge Date:                  Expected Discharge Plan:  [place]  In-House Referral:     Discharge planning Services  CM Consult  Post Acute Care Choice:  Resumption of Svcs/PTA Provider Choice offered to:     DME Arranged:    DME Agency:     HH Arranged:  RN, PT, OT HH Agency:  [place]  Status of Service:  Completed, signed off  Medicare Important Message Given:    Date Medicare IM Given:    Medicare IM give by:    Date Additional Medicare IM Given:    Additional Medicare Important Message give by:     If discussed at [place] of Stay Meetings, dates discussed:    Additional Comments:  Zenon Mayo, RN 02/16/2015, 11:19 AM

## 2015-03-11 ENCOUNTER — Encounter: Payer: Self-pay | Admitting: Cardiology

## 2015-05-11 ENCOUNTER — Emergency Department (HOSPITAL_COMMUNITY): Payer: Medicare Other

## 2015-05-11 ENCOUNTER — Inpatient Hospital Stay (HOSPITAL_COMMUNITY): Payer: Medicare Other

## 2015-05-11 ENCOUNTER — Inpatient Hospital Stay (HOSPITAL_COMMUNITY)
Admission: EM | Admit: 2015-05-11 | Discharge: 2015-05-12 | DRG: 371 | Disposition: A | Discharge status: Other Acute Inpatient Hospital | Payer: Medicare Other | Attending: Internal Medicine | Admitting: Internal Medicine

## 2015-05-11 ENCOUNTER — Encounter (HOSPITAL_COMMUNITY): Payer: Self-pay | Admitting: Emergency Medicine

## 2015-05-11 DIAGNOSIS — Z905 Acquired absence of kidney: Secondary | ICD-10-CM

## 2015-05-11 DIAGNOSIS — I517 Cardiomegaly: Secondary | ICD-10-CM | POA: Diagnosis present

## 2015-05-11 DIAGNOSIS — H540 Blindness, both eyes: Secondary | ICD-10-CM | POA: Diagnosis present

## 2015-05-11 DIAGNOSIS — D631 Anemia in chronic kidney disease: Secondary | ICD-10-CM | POA: Diagnosis present

## 2015-05-11 DIAGNOSIS — Z89431 Acquired absence of right foot: Secondary | ICD-10-CM

## 2015-05-11 DIAGNOSIS — Z951 Presence of aortocoronary bypass graft: Secondary | ICD-10-CM | POA: Diagnosis not present

## 2015-05-11 DIAGNOSIS — K651 Peritoneal abscess: Principal | ICD-10-CM | POA: Diagnosis present

## 2015-05-11 DIAGNOSIS — B999 Unspecified infectious disease: Secondary | ICD-10-CM | POA: Diagnosis present

## 2015-05-11 DIAGNOSIS — R109 Unspecified abdominal pain: Secondary | ICD-10-CM | POA: Diagnosis present

## 2015-05-11 DIAGNOSIS — M1990 Unspecified osteoarthritis, unspecified site: Secondary | ICD-10-CM | POA: Diagnosis present

## 2015-05-11 DIAGNOSIS — Z85528 Personal history of other malignant neoplasm of kidney: Secondary | ICD-10-CM | POA: Diagnosis not present

## 2015-05-11 DIAGNOSIS — E782 Mixed hyperlipidemia: Secondary | ICD-10-CM | POA: Diagnosis present

## 2015-05-11 DIAGNOSIS — K659 Peritonitis, unspecified: Secondary | ICD-10-CM | POA: Insufficient documentation

## 2015-05-11 DIAGNOSIS — Z7982 Long term (current) use of aspirin: Secondary | ICD-10-CM

## 2015-05-11 DIAGNOSIS — H547 Unspecified visual loss: Secondary | ICD-10-CM | POA: Diagnosis present

## 2015-05-11 DIAGNOSIS — Z8546 Personal history of malignant neoplasm of prostate: Secondary | ICD-10-CM

## 2015-05-11 DIAGNOSIS — I2510 Atherosclerotic heart disease of native coronary artery without angina pectoris: Secondary | ICD-10-CM | POA: Diagnosis present

## 2015-05-11 DIAGNOSIS — E1122 Type 2 diabetes mellitus with diabetic chronic kidney disease: Secondary | ICD-10-CM | POA: Diagnosis present

## 2015-05-11 DIAGNOSIS — Z992 Dependence on renal dialysis: Secondary | ICD-10-CM | POA: Diagnosis not present

## 2015-05-11 DIAGNOSIS — N186 End stage renal disease: Secondary | ICD-10-CM | POA: Diagnosis not present

## 2015-05-11 DIAGNOSIS — Z79899 Other long term (current) drug therapy: Secondary | ICD-10-CM | POA: Diagnosis not present

## 2015-05-11 DIAGNOSIS — I1311 Hypertensive heart and chronic kidney disease without heart failure, with stage 5 chronic kidney disease, or end stage renal disease: Secondary | ICD-10-CM | POA: Diagnosis present

## 2015-05-11 DIAGNOSIS — D649 Anemia, unspecified: Secondary | ICD-10-CM | POA: Diagnosis present

## 2015-05-11 DIAGNOSIS — I739 Peripheral vascular disease, unspecified: Secondary | ICD-10-CM | POA: Diagnosis present

## 2015-05-11 LAB — CBC
HCT: 26.7 % — ABNORMAL LOW (ref 39.0–52.0)
HEMOGLOBIN: 8.2 g/dL — AB (ref 13.0–17.0)
MCH: 29.2 pg (ref 26.0–34.0)
MCHC: 30.7 g/dL (ref 30.0–36.0)
MCV: 95 fL (ref 78.0–100.0)
Platelets: 148 10*3/uL — ABNORMAL LOW (ref 150–400)
RBC: 2.81 MIL/uL — ABNORMAL LOW (ref 4.22–5.81)
RDW: 15.4 % (ref 11.5–15.5)
WBC: 9.4 10*3/uL (ref 4.0–10.5)

## 2015-05-11 LAB — COMPREHENSIVE METABOLIC PANEL
ALK PHOS: 92 U/L (ref 38–126)
ALT: 10 U/L — AB (ref 17–63)
ANION GAP: 12 (ref 5–15)
AST: 13 U/L — ABNORMAL LOW (ref 15–41)
Albumin: 2.7 g/dL — ABNORMAL LOW (ref 3.5–5.0)
BUN: 39 mg/dL — ABNORMAL HIGH (ref 6–20)
CALCIUM: 8.8 mg/dL — AB (ref 8.9–10.3)
CO2: 32 mmol/L (ref 22–32)
Chloride: 95 mmol/L — ABNORMAL LOW (ref 101–111)
Creatinine, Ser: 4.51 mg/dL — ABNORMAL HIGH (ref 0.61–1.24)
GFR calc Af Amer: 14 mL/min — ABNORMAL LOW (ref >60)
GFR calc non Af Amer: 12 mL/min — ABNORMAL LOW (ref >60)
GLUCOSE: 219 mg/dL — AB (ref 65–99)
Potassium: 3 mmol/L — ABNORMAL LOW (ref 3.5–5.1)
Sodium: 139 mmol/L (ref 135–145)
TOTAL PROTEIN: 6.1 g/dL — AB (ref 6.5–8.1)
Total Bilirubin: 0.6 mg/dL (ref 0.3–1.2)

## 2015-05-11 LAB — URINE MICROSCOPIC-ADD ON

## 2015-05-11 LAB — URINALYSIS, ROUTINE W REFLEX MICROSCOPIC
BILIRUBIN URINE: NEGATIVE
Glucose, UA: NEGATIVE mg/dL
KETONES UR: NEGATIVE mg/dL
NITRITE: NEGATIVE
SPECIFIC GRAVITY, URINE: 1.014 (ref 1.005–1.030)
pH: 7.5 (ref 5.0–8.0)

## 2015-05-11 LAB — GLUCOSE, CAPILLARY: GLUCOSE-CAPILLARY: 142 mg/dL — AB (ref 65–99)

## 2015-05-11 LAB — LIPASE, BLOOD: Lipase: 23 U/L (ref 11–51)

## 2015-05-11 MED ORDER — MORPHINE SULFATE (PF) 2 MG/ML IV SOLN: 1.0000 mg | INTRAVENOUS | Status: DC | PRN

## 2015-05-11 MED ORDER — ACETAMINOPHEN 325 MG PO TABS: 650.0000 mg | ORAL_TABLET | Freq: Four times a day (QID) | ORAL | Status: DC | PRN

## 2015-05-11 MED ORDER — ONDANSETRON HCL 4 MG PO TABS: 4.0000 mg | ORAL_TABLET | Freq: Four times a day (QID) | ORAL | Status: DC | PRN

## 2015-05-11 MED ORDER — ACETAMINOPHEN 650 MG RE SUPP
650.0000 mg | Freq: Four times a day (QID) | RECTAL | Status: DC | PRN
  Administered 2015-05-12: 650 mg via RECTAL
  Filled 2015-05-11: qty 1

## 2015-05-11 MED ORDER — INSULIN ASPART 100 UNIT/ML ~~LOC~~ SOLN
0.0000 [IU] | SUBCUTANEOUS | Status: DC
  Administered 2015-05-11: 1 [IU] via SUBCUTANEOUS

## 2015-05-11 MED ORDER — PIPERACILLIN-TAZOBACTAM IN DEX 2-0.25 GM/50ML IV SOLN
2.2500 g | Freq: Three times a day (TID) | INTRAVENOUS | Status: DC
  Administered 2015-05-12 (×2): 2.25 g via INTRAVENOUS
  Filled 2015-05-11 (×5): qty 50

## 2015-05-11 MED ORDER — PIPERACILLIN-TAZOBACTAM 3.375 G IVPB 30 MIN
3.3750 g | INTRAVENOUS | Status: AC
  Administered 2015-05-11: 3.375 g via INTRAVENOUS
  Filled 2015-05-11: qty 50

## 2015-05-11 MED ORDER — ONDANSETRON HCL 4 MG/2ML IJ SOLN: 4.0000 mg | Freq: Four times a day (QID) | INTRAMUSCULAR | Status: DC | PRN

## 2015-05-11 MED ORDER — SODIUM CHLORIDE 0.9 % IV BOLUS (SEPSIS)
1000.0000 mL | Freq: Once | INTRAVENOUS | Status: AC
  Administered 2015-05-11: 1000 mL via INTRAVENOUS

## 2015-05-11 MED ORDER — MORPHINE SULFATE (PF) 4 MG/ML IV SOLN
4.0000 mg | Freq: Once | INTRAVENOUS | Status: AC
  Administered 2015-05-11: 4 mg via INTRAVENOUS
  Filled 2015-05-11: qty 1

## 2015-05-11 MED ORDER — SODIUM CHLORIDE 0.9% FLUSH
3.0000 mL | Freq: Two times a day (BID) | INTRAVENOUS | Status: DC
  Administered 2015-05-11: 3 mL via INTRAVENOUS

## 2015-05-11 NOTE — ED Notes (Signed)
Pt returned to room from x-ray without distress noted.

## 2015-05-11 NOTE — ED Notes (Signed)
Pt noted to desat to 84% after given Morphine. Applied O2 at 2lpm with immediately increase to 94%.

## 2015-05-11 NOTE — ED Notes (Signed)
Pt does have a urinal

## 2015-05-11 NOTE — ED Notes (Signed)
PA at bedside.

## 2015-05-11 NOTE — Consult Note (Signed)
Reason for Consult:  Intraabdominal infection Referring Physician: Gloriann Loan, PA  [name] is an 65 y.o. male.  HPI:  This is a 65 year old male who one went a laparoscopic converted to open right nephrectomy 01/30/2015 for renal cell carcinoma at [facility]. Postop course was complicated by the Pseudomonas UTI. He is on chronic hemodialysis for end-stage renal disease. He subsequently was discharged from the hospital. For about 2-3 weeks, he's been having some lower abdominal discomfort and right-sided discomfort. The pain got severe and he had fever today. He presented the emergency department and reports to me that most of his pain was hypogastric area. In an out catheterization apparently evacuated purulent material and he states after that his pain went from an H2 O1. He is also noted a lump in his right mid abdominal area at the site of a right subcostal scar. He is eating well. He denies nausea and vomiting. He states his bowels are moving normally. He goes to dialysis every Monday, Wednesday, and Friday.  Past Medical History  Diagnosis Date  . CAD, NATIVE VESSEL 01/08/2008       . HYPERLIPIDEMIA-MIXED 01/08/2008  . OVERWEIGHT/OBESITY 01/08/2008    Lost 205 lbs through diet and exercise.    . Anemia   . Bruises easily   . Blind     both eyes removed   . Family history of breast cancer     mother  . Cellulitis late 1980's    "hospitalized; wrapped both legs; several times; no OR for this"  . Peripheral vascular disease ([place])   . Colon polyps   . HYPERTENSION 01/27/2009    BP low  . Hypotension   . Pneumonia 2000;s X 1  . DM type 2 (diabetes mellitus, type 2) ([place])     no medications (07/28/2014)  . Arthritis     "back, neck" (07/28/2014)  . ESRD (end stage renal disease) on dialysis [facility])     [name]; Mon, Wed, Fri (07/28/2014)  . Renal insufficiency   . Prostate cancer ([place])   . Renal cell carcinoma 2001 and 2003    "both kidneys"    Past Surgical History   Procedure Laterality Date  . Foot amputation Right ~ 2002    right;  partial; "infection"  . Colonoscopy  06/14/2011    Procedure: COLONOSCOPY;  Surgeon: [name], MD;  Location: [facility] ENDOSCOPY;  Service: Endoscopy;  Laterality: N/A;  . Hot hemostasis  06/14/2011    Procedure: HOT HEMOSTASIS (ARGON PLASMA COAGULATION/BICAP);  Surgeon: [name], MD;  Location: [facility];  Service: Endoscopy;  Laterality: N/A;  . Av fistula placement Left 02/2010    LFA  . Varicose vein surgery  mid 1980's     BLE; "knees down; both legs; 2 separate times"  . Radiofrequency ablation kidney  2010-2012    "twice; one on each side; for cancer"  . Coronary artery bypass graft  09/29/2011    Procedure: CORONARY ARTERY BYPASS GRAFTING (CABG);  Surgeon: [name], MD;  Location: [place];  Service: Open Heart Surgery;  Laterality: N/A;  Coronary Artery Bypass Graft times two utilizing the left internal mammary artery and the right greater saphenous vein harvested endoscopically.  . Colonoscopy N/A 07/24/2012    Procedure: COLONOSCOPY;  Surgeon: [name], MD;  Location: [facility] ENDOS[facility];  Service: Endoscopy;  Laterality: N/A;  . Hip pinning,cannulated Left 12/31/2013    Procedure: CANNULATED HIP PINNING;  Surgeon: Renette Butters, MD;  Location: [place];  Service: Orthopedics;  Laterality: Left;  Carm, FX Table, [employer]  . Left heart catheterization with coronary angiogram N/A 09/27/2011    Procedure: LEFT HEART CATHETERIZATION WITH CORONARY ANGIOGRAM;  Surgeon: [name], MD;  Location: [facility] CATH LAB;  Service: Cardiovascular;  Laterality: N/A;  . Colonoscopy with propofol N/A 05/13/2014    Procedure: COLONOSCOPY WITH PROPOFOL;  Surgeon: [name], MD;  Location: [facility];  Service: Endoscopy;  Laterality: N/A;  . Colonoscopy    . Enucleation  2003; 2006    bilateral; "diabetes; pain"  . Fracture surgery    . Cholecystectomy open  1992  . Insertion prostate radiation seed  03/2012  .  Cystoscopy with retrograde pyelogram, ureteroscopy and stent placement Left 07/28/2014    Procedure: CYSTOSCOPY WITH RETROGRADE PYELOGRAM, URETERAL BALLOON DILITATION, URETEROSCOPY AND LEFT STENT PLACEMENT;  Surgeon: [name], MD;  Location: [facility] OR[facility];  Service: Urology;  Laterality: Left;  . Ligation of competing branches of arteriovenous fistula Left 07/29/2014    Procedure: LIGATION OF COMPETING BRANCHES OF LEFT ARM ARTERIOVENOUS FISTULA;  Surgeon: [name], MD;  Location: [place];  Service: Vascular;  Laterality: Left;  . Nephrectomy Right 01/30/2015    done at [place] History  Problem Relation Age of Onset  . Cancer    . Coronary artery disease    . Kidney disease    . Breast cancer    . Ovarian cancer    . Lung cancer    . Diabetes    . Cancer Mother 75    breast, spine and ovarian  . Heart disease Mother   . Hyperlipidemia Mother   . Hypertension Mother   . Varicose Veins Mother   . Emphysema Father   . Cancer Father 21    kidney, prostate  . Heart disease Father   . Hyperlipidemia Father   . Hypertension Father   . Cancer Sister     ovarian  . Cancer Brother     lung  . Heart disease Brother   . Hyperlipidemia Brother   . Hypertension Brother   . Heart attack Brother   . Peripheral vascular disease Brother   . Malignant hyperthermia Neg Hx     Social History:  reports that he has never smoked. He has never used smokeless tobacco. He reports that he does not drink alcohol or use illicit drugs.  Allergies:  Allergies  Allergen Reactions  . Codeine Other (See Comments)    Makes patient incoherent. STATES MAKES HIM COMATOSE  . Tape Other (See Comments)    Plastic tape tears skin off, please use paper tape instead.    Prior to Admission medications   Medication Sig Start Date End Date Taking? Authorizing Provider  aspirin 81 MG tablet Take 81 mg by mouth daily.   Yes Historical Provider, MD  atorvastatin (LIPITOR) 10 MG tablet TAKE 1 TABLET  BY MOUTH EVERY DAY Patient taking differently: Take 1 tablet by mouth every day. 05/13/14  Yes Thayer Headings, MD  b complex-vitamin c-folic acid (NEPHRO-VITE) 0.8 MG TABS Take 0.8 mg by mouth at bedtime.   Yes Historical Provider, MD  calcitRIOL (ROCALTROL) 0.25 MCG capsule Take 0.25 mcg by mouth 3 (three) times a week. Monday, Wednesday, and Friday.   Yes Historical Provider, MD  heparin 5000 UNIT/ML injection Inject 8,000 Units into the skin 3 (three) times a week. Monday, Wednesday, and Friday.   Yes Historical Provider, MD  Methoxy PEG-Epoetin Beta (MIRCERA) 50 MCG/0.3ML  SOSY Inject 225 mcg as directed every 14 (fourteen) days.    Yes Historical Provider, MD  midodrine (PROAMATINE) 10 MG tablet Take 10 mg by mouth 2 (two) times daily.   Yes Historical Provider, MD  multivitamin (RENA-VIT) TABS tablet Take 1 tablet by mouth at bedtime. 02/15/15  Yes Orson Eva, MD  sevelamer carbonate (RENVELA) 800 MG tablet Take 4,000 mg by mouth 3 (three) times daily with meals.  06/26/14  Yes Historical Provider, MD  Nutritional Supplements (FEEDING SUPPLEMENT, NEPRO CARB STEADY,) LIQD Take 237 mLs by mouth 2 (two) times daily between meals. Patient not taking: Reported on 05/11/2015 02/15/15   Orson Eva, MD     Results for orders placed or performed during the hospital encounter of 05/11/15 (from the past 48 hour(s))  Lipase, blood     Status: None   Collection Time: 05/11/15  4:20 PM  Result Value Ref Range   Lipase 23 11 - 51 U/L  Comprehensive metabolic panel     Status: Abnormal   Collection Time: 05/11/15  4:20 PM  Result Value Ref Range   Sodium 139 135 - 145 mmol/L   Potassium 3.0 (L) 3.5 - 5.1 mmol/L   Chloride 95 (L) 101 - 111 mmol/L   CO2 32 22 - 32 mmol/L   Glucose, Bld 219 (H) 65 - 99 mg/dL   BUN 39 (H) 6 - 20 mg/dL   Creatinine, Ser 4.51 (H) 0.61 - 1.24 mg/dL   Calcium 8.8 (L) 8.9 - 10.3 mg/dL   Total Protein 6.1 (L) 6.5 - 8.1 g/dL   Albumin 2.7 (L) 3.5 - 5.0 g/dL   AST 13 (L) 15 - 41  U/L   ALT 10 (L) 17 - 63 U/L   Alkaline Phosphatase 92 38 - 126 U/L   Total Bilirubin 0.6 0.3 - 1.2 mg/dL   GFR calc non Af Amer 12 (L) >60 mL/min   GFR calc Af Amer 14 (L) >60 mL/min    Comment: (NOTE) The eGFR has been calculated using the CKD EPI equation. This calculation has not been validated in all clinical situations. eGFR's persistently <60 mL/min signify possible Chronic Kidney Disease.    Anion gap 12 5 - 15  CBC     Status: Abnormal   Collection Time: 05/11/15  4:20 PM  Result Value Ref Range   WBC 9.4 4.0 - 10.5 K/uL   RBC 2.81 (L) 4.22 - 5.81 MIL/uL   Hemoglobin 8.2 (L) 13.0 - 17.0 g/dL   HCT 26.7 (L) 39.0 - 52.0 %   MCV 95.0 78.0 - 100.0 fL   MCH 29.2 26.0 - 34.0 pg   MCHC 30.7 30.0 - 36.0 g/dL   RDW 15.4 11.5 - 15.5 %   Platelets 148 (L) 150 - 400 K/uL  Urinalysis, Routine w reflex microscopic (not at [facility])     Status: Abnormal   Collection Time: 05/11/15  6:28 PM  Result Value Ref Range   Color, Urine YELLOW YELLOW   APPearance TURBID (A) CLEAR   Specific Gravity, Urine 1.014 1.005 - 1.030   pH 7.5 5.0 - 8.0   Glucose, UA NEGATIVE NEGATIVE mg/dL   Hgb urine dipstick MODERATE (A) NEGATIVE   Bilirubin Urine NEGATIVE NEGATIVE   Ketones, ur NEGATIVE NEGATIVE mg/dL   Protein, ur >300 (A) NEGATIVE mg/dL   Nitrite NEGATIVE NEGATIVE   Leukocytes, UA LARGE (A) NEGATIVE  Urine microscopic-add on     Status: Abnormal   Collection Time: 05/11/15  6:28 PM  Result  Value Ref Range   Squamous Epithelial / LPF 6-30 (A) NONE SEEN   WBC, UA TOO NUMEROUS TO COUNT 0 - 5 WBC/hpf   RBC / HPF TOO NUMEROUS TO COUNT 0 - 5 RBC/hpf   Bacteria, UA MANY (A) NONE SEEN    Dg Chest 2 View  05/11/2015  CLINICAL DATA:  Abdominal pain.  Renal failure patient. EXAM: CHEST  2 VIEW COMPARISON:  02/13/2015. FINDINGS: Cardiomegaly. Prior CABG. Low lung volumes. Mild vascular congestion. Chronic RIGHT hemidiaphragm elevation. No overt infiltrates or failure. No effusion or visible  pneumothorax. IMPRESSION: Cardiomegaly. Mild vascular congestion. Slight worsening aeration from priors. Electronically Signed   By: Staci Righter M.D.   On: 05/11/2015 17:15   Ct Renal Stone Study  05/11/2015  CLINICAL DATA:  Lower right quadrant pain and abdominal pain. Abdominal tenderness. On dialysis. Diabetic. History of renal cell carcinoma, status post right nephrectomy 10/16. EXAM: CT ABDOMEN AND PELVIS WITHOUT CONTRAST TECHNIQUE: Multidetector CT imaging of the abdomen and pelvis was performed following the standard protocol without IV contrast. COMPARISON:  10/01/2014 FINDINGS: Lower chest: right base atelectasis. Mild cardiomegaly with prior median sternotomy. Native coronary artery atherosclerosis. Hepatobiliary: Normal liver. Cholecystectomy, without biliary ductal dilatation. Pancreas: Pancreatic atrophy. Spleen: Progressive splenomegaly, including 17.8 cm today versus 16.4 cm on 10/01/2014. Adrenals/Urinary Tract: Normal adrenal glands. Left renal cortical thinning with too small to characterize lesions within the left kidney. Left renal vascular calcifications. Interval right nephrectomy. Normal urinary bladder. Stomach/Bowel: Normal stomach, without wall thickening. Periampullary duodenal diverticulum. Scattered colonic diverticula. Normal terminal ileum and appendix. extensive edema posterior and lateral to the ascending colon, including on image 51/ series 2. Foci of extraluminal gas are identified including on image 49/ series 2. No convincing evidence of adjacent colonic wall thickening. A lateral right abdominal wall fluid collection measures 4.0 x 3.0 cm on image 54/series 2. Otherwise normal small bowel. Vascular/Lymphatic: Advanced aortic and branch vessel atherosclerosis. No abdominopelvic adenopathy. Reproductive: Radiation seeds in the prostate. Other: No significant free fluid. A fat containing left inguinal hernia Musculoskeletal: Proximal left femoral fixation. Osteopenia.  Degenerative partial fusion of the bilateral sacroiliac joints. Left sacral bone island. Diffuse idiopathic skeletal hyperostosis versus ankylosing spondylitis. IMPRESSION: 1. Low sensitivity exam secondary lack of oral or IV contrast. 2. Interval right nephrectomy. Right intra-abdominal edema and extraluminal gas with abdominal wall fluid collection. This is suspicious for infection. This could be postoperative or related to the ascending colon. A colonic source is felt less likely, given absence of significant colonic wall thickening. 3. Progressive splenomegaly. 4. No evidence of metastatic disease from prostate cancer. Electronically Signed   By: Abigail Miyamoto M.D.   On: 05/11/2015 17:42    Review of Systems  Constitutional: Positive for fever. Negative for chills.  Eyes:       He is blind.  Respiratory: Negative for shortness of breath.   Cardiovascular: Negative for chest pain.  Gastrointestinal: Positive for abdominal pain.  Neurological:       Chronic lower extremity numbness due to neuropathy   Blood pressure 110/63, pulse 70, temperature 99.4 F (37.4 C), temperature source Oral, resp. rate 15, SpO2 100 %. Physical Exam  Constitutional:  Overweight male in no acute distress. Pleasant and cooperative.  Cardiovascular:  Regular rate and rhythm.  Respiratory:  midsternal scar.  GI: Soft.  Right subcostal scar is present with a bulge at the lateral aspect of this firm and not reducible. This is not tender. Multiple small scars present as well. No suprapubic  tenderness. No peritoneal signs.  Neurological: He is alert.  Skin: Skin is warm and dry.    Assessment/Plan: 1. Significant pyuria and bacteriuria. After in and out catheterization, he states his pain improved significantly.  2. Inflammatory changes right retroperitoneal area andAbdominal wall fluid collection with gas in these areas as well suggestive of an infectious process. He is afebrile and has a normal white blood cell  count.   3. Multiple comorbidities including end-stage renal disease requiring hemodialysis Monday, Wednesday, and Friday.  Plan: Given that the CT was noncontrasted, it is difficult to differentiate where the infectious/inflammatory process is emanating from specifically if it's coming from a intestinal source or not versus an infection in the postoperative site. Thus, recommend CT scan with oral contrast, broad-spectrum antibiotics, further recommendations based on the results of the oral contrasted CT scan.  Jennyfer Nickolson J 05/11/2015, 8:21 PM

## 2015-05-11 NOTE — ED Notes (Addendum)
Per EMS, lower right quadrant pain and lower central abdominal pain, warm to touch. Abdomin tender to palpation, patient groaning consistently in triage. Patient's wife states he has had a kidney stone/urethral blockage in the past. Pt feels like he needs to urinate but doesn't usually create a lot of urine. Pt is completely blind, is a dialysis patient (last dialysis this morning) and a diabetic.

## 2015-05-11 NOTE — H&P (Signed)
[facility] History and Physical  [name] [id number] DOB: Jun 25, 1950 DOA: 05/11/2015  Referring physician: Ms.[name]. PCP: [name], MD  Specialists: Nephrology.  Chief Complaint: Abdominal pain.  HPI: [name] is a 65 y.o. male with history of ESRD on hemodialysis on Monday Wednesday Friday, history of renal cell carcinoma status post nephrectomy in October 2016, diabetes mellitus type 2, CAD status post CABG presents to the ER because of abdominal pain. Patient has been having abdominal pain off and on for last 1 week. Had one episode of nausea vomiting 4 days ago. Denies any diarrhea. Pain is mostly in the right flank and suprapubic area. In the ER after Foley catheter was placed patient had pyuria and drained. UA shows too numerous to count white blood cell count. CT renal study shows intra-abdominal infectious process with extraluminal air. On-call surgeon Dr. Barkley Bruns was consulted and Dr. Barkley Bruns at this time as requested CT with contrast to further assess patient's intra-abdominal inflammatory/infectious source. Patient will be admitted for further management. Since patient is a dialysis patient will be transferred to [facility]. Denies any chest pain or shortness of breath. Abdominal pain is improved after urinary catheter placement.   Review of Systems: As presented in the history of presenting illness, rest negative.  Past Medical History  Diagnosis Date  . CAD, NATIVE VESSEL 01/08/2008       . HYPERLIPIDEMIA-MIXED 01/08/2008  . OVERWEIGHT/OBESITY 01/08/2008    Lost 205 lbs through diet and exercise.    . Anemia   . Bruises easily   . Blind     both eyes removed   . Family history of breast cancer     mother  . Cellulitis late 1980's    "hospitalized; wrapped both legs; several times; no OR for this"  . Peripheral vascular disease ([place])   . Colon polyps   . HYPERTENSION 01/27/2009    BP low  . Hypotension   . Pneumonia 2000;s X 1  . DM type  2 (diabetes mellitus, type 2) ([place])     no medications (07/28/2014)  . Arthritis     "back, neck" (07/28/2014)  . ESRD (end stage renal disease) on dialysis [facility])     [name]; Mon, Wed, Fri (07/28/2014)  . Renal insufficiency   . Prostate cancer ([place])   . Renal cell carcinoma 2001 and 2003    "both kidneys"   Past Surgical History  Procedure Laterality Date  . Foot amputation Right ~ 2002    right;  partial; "infection"  . Colonoscopy  06/14/2011    Procedure: COLONOSCOPY;  Surgeon: [name], MD;  Location: [facility] ENDOSCOPY;  Service: Endoscopy;  Laterality: N/A;  . Hot hemostasis  06/14/2011    Procedure: HOT HEMOSTASIS (ARGON PLASMA COAGULATION/BICAP);  Surgeon: [name], MD;  Location: [facility];  Service: Endoscopy;  Laterality: N/A;  . Av fistula placement Left 02/2010    LFA  . Varicose vein surgery  mid 1980's     BLE; "knees down; both legs; 2 separate times"  . Radiofrequency ablation kidney  2010-2012    "twice; one on each side; for cancer"  . Coronary artery bypass graft  09/29/2011    Procedure: CORONARY ARTERY BYPASS GRAFTING (CABG);  Surgeon: [name], MD;  Location: [place];  Service: Open Heart Surgery;  Laterality: N/A;  Coronary Artery Bypass Graft times two utilizing the left internal mammary artery and the right greater saphenous vein harvested endoscopically.  . Colonoscopy N/A 07/24/2012  Procedure: COLONOSCOPY;  Surgeon: [name], MD;  Location: [facility];  Service: Endoscopy;  Laterality: N/A;  . Hip pinning,cannulated Left 12/31/2013    Procedure: CANNULATED HIP PINNING;  Surgeon: Renette Butters, MD;  Location: [place];  Service: Orthopedics;  Laterality: Left;  Carm, FX Table, [employer]  . Left heart catheterization with coronary angiogram N/A 09/27/2011    Procedure: LEFT HEART CATHETERIZATION WITH CORONARY ANGIOGRAM;  Surgeon: [name], MD;  Location: [facility] CATH LAB;  Service: Cardiovascular;  Laterality: N/A;  . Colonoscopy  with propofol N/A 05/13/2014    Procedure: COLONOSCOPY WITH PROPOFOL;  Surgeon: [name], MD;  Location: [facility];  Service: Endoscopy;  Laterality: N/A;  . Colonoscopy    . Enucleation  2003; 2006    bilateral; "diabetes; pain"  . Fracture surgery    . Cholecystectomy open  1992  . Insertion prostate radiation seed  03/2012  . Cystoscopy with retrograde pyelogram, ureteroscopy and stent placement Left 07/28/2014    Procedure: CYSTOSCOPY WITH RETROGRADE PYELOGRAM, URETERAL BALLOON DILITATION, URETEROSCOPY AND LEFT STENT PLACEMENT;  Surgeon: [name], MD;  Location: [facility];  Service: Urology;  Laterality: Left;  . Ligation of competing branches of arteriovenous fistula Left 07/29/2014    Procedure: LIGATION OF COMPETING BRANCHES OF LEFT ARM ARTERIOVENOUS FISTULA;  Surgeon: [name], MD;  Location: [place];  Service: Vascular;  Laterality: Left;  . Nephrectomy Right 01/30/2015    done at [place] History:  reports that he has never smoked. He has never used smokeless tobacco. He reports that he does not drink alcohol or use illicit drugs. Where does patient live home. Can patient participate in ADLs? Yes.  Allergies  Allergen Reactions  . Codeine Other (See Comments)    Makes patient incoherent. STATES MAKES HIM COMATOSE  . Tape Other (See Comments)    Plastic tape tears skin off, please use paper tape instead.    Family History:  Family History  Problem Relation Age of Onset  . Cancer    . Coronary artery disease    . Kidney disease    . Breast cancer    . Ovarian cancer    . Lung cancer    . Diabetes    . Cancer Mother 10    breast, spine and ovarian  . Heart disease Mother   . Hyperlipidemia Mother   . Hypertension Mother   . Varicose Veins Mother   . Emphysema Father   . Cancer Father 53    kidney, prostate  . Heart disease Father   . Hyperlipidemia Father   . Hypertension Father   . Cancer Sister     ovarian  . Cancer Brother     lung   . Heart disease Brother   . Hyperlipidemia Brother   . Hypertension Brother   . Heart attack Brother   . Peripheral vascular disease Brother   . Malignant hyperthermia Neg Hx       Prior to Admission medications   Medication Sig Start Date End Date Taking? Authorizing Provider  aspirin 81 MG tablet Take 81 mg by mouth daily.   Yes Historical Provider, MD  atorvastatin (LIPITOR) 10 MG tablet TAKE 1 TABLET BY MOUTH EVERY DAY Patient taking differently: Take 1 tablet by mouth every day. 05/13/14  Yes Thayer Headings, MD  b complex-vitamin c-folic acid (NEPHRO-VITE) 0.8 MG TABS Take 0.8 mg by mouth at bedtime.   Yes Historical Provider, MD  calcitRIOL (ROCALTROL) 0.25  MCG capsule Take 0.25 mcg by mouth 3 (three) times a week. Monday, Wednesday, and Friday.   Yes Historical Provider, MD  heparin 5000 UNIT/ML injection Inject 8,000 Units into the skin 3 (three) times a week. Monday, Wednesday, and Friday.   Yes Historical Provider, MD  Methoxy PEG-Epoetin Beta (MIRCERA) 50 MCG/0.3ML SOSY Inject 225 mcg as directed every 14 (fourteen) days.    Yes Historical Provider, MD  midodrine (PROAMATINE) 10 MG tablet Take 10 mg by mouth 2 (two) times daily.   Yes Historical Provider, MD  multivitamin (RENA-VIT) TABS tablet Take 1 tablet by mouth at bedtime. 02/15/15  Yes Orson Eva, MD  sevelamer carbonate (RENVELA) 800 MG tablet Take 4,000 mg by mouth 3 (three) times daily with meals.  06/26/14  Yes Historical Provider, MD  Nutritional Supplements (FEEDING SUPPLEMENT, NEPRO CARB STEADY,) LIQD Take 237 mLs by mouth 2 (two) times daily between meals. Patient not taking: Reported on 05/11/2015 02/15/15   Orson Eva, MD    Physical Exam: Filed Vitals:   05/11/15 1900 05/11/15 1915 05/11/15 1930 05/11/15 2000  BP: 103/55  99/57 110/63  Pulse:   69 70  Temp:      TempSrc:      Resp: 26  19 15   SpO2: 93% 93% 98% 100%     General:  Moderately built and nourished.  Eyes: Anicteric no pallor.  ENT: No  discharge from the ears eyes nose or mouth.  Neck: No mass felt.  Cardiovascular: S1 and S2 heard.  Respiratory: No rhonchi or crepitations.  Abdomen: Soft nontender bowel sounds present. No guarding or rigidity.  Skin: Scar on the abdomen in the right side.  Musculoskeletal: No edema.  Psychiatric: Appears normal.  Neurologic: Alert and oriented to time place and person. Moves all extremities.  Labs on Admission:  Basic Metabolic Panel:  Recent Labs Lab 05/11/15 1620  NA 139  K 3.0*  CL 95*  CO2 32  GLUCOSE 219*  BUN 39*  CREATININE 4.51*  CALCIUM 8.8*   Liver Function Tests:  Recent Labs Lab 05/11/15 1620  AST 13*  ALT 10*  ALKPHOS 92  BILITOT 0.6  PROT 6.1*  ALBUMIN 2.7*    Recent Labs Lab 05/11/15 1620  LIPASE 23   No results for input(s): AMMONIA in the last 168 hours. CBC:  Recent Labs Lab 05/11/15 1620  WBC 9.4  HGB 8.2*  HCT 26.7*  MCV 95.0  PLT 148*   Cardiac Enzymes: No results for input(s): CKTOTAL, CKMB, CKMBINDEX, TROPONINI in the last 168 hours.  BNP (last 3 results) No results for input(s): BNP in the last 8760 hours.  ProBNP (last 3 results) No results for input(s): PROBNP in the last 8760 hours.  CBG: No results for input(s): GLUCAP in the last 168 hours.  Radiological Exams on Admission: Dg Chest 2 View  05/11/2015  CLINICAL DATA:  Abdominal pain.  Renal failure patient. EXAM: CHEST  2 VIEW COMPARISON:  02/13/2015. FINDINGS: Cardiomegaly. Prior CABG. Low lung volumes. Mild vascular congestion. Chronic RIGHT hemidiaphragm elevation. No overt infiltrates or failure. No effusion or visible pneumothorax. IMPRESSION: Cardiomegaly. Mild vascular congestion. Slight worsening aeration from priors. Electronically Signed   By: Staci Righter M.D.   On: 05/11/2015 17:15   Ct Renal Stone Study  05/11/2015  CLINICAL DATA:  Lower right quadrant pain and abdominal pain. Abdominal tenderness. On dialysis. Diabetic. History of renal cell  carcinoma, status post right nephrectomy 10/16. EXAM: CT ABDOMEN AND PELVIS WITHOUT CONTRAST TECHNIQUE: Multidetector CT  imaging of the abdomen and pelvis was performed following the standard protocol without IV contrast. COMPARISON:  10/01/2014 FINDINGS: Lower chest: right base atelectasis. Mild cardiomegaly with prior median sternotomy. Native coronary artery atherosclerosis. Hepatobiliary: Normal liver. Cholecystectomy, without biliary ductal dilatation. Pancreas: Pancreatic atrophy. Spleen: Progressive splenomegaly, including 17.8 cm today versus 16.4 cm on 10/01/2014. Adrenals/Urinary Tract: Normal adrenal glands. Left renal cortical thinning with too small to characterize lesions within the left kidney. Left renal vascular calcifications. Interval right nephrectomy. Normal urinary bladder. Stomach/Bowel: Normal stomach, without wall thickening. Periampullary duodenal diverticulum. Scattered colonic diverticula. Normal terminal ileum and appendix. extensive edema posterior and lateral to the ascending colon, including on image 51/ series 2. Foci of extraluminal gas are identified including on image 49/ series 2. No convincing evidence of adjacent colonic wall thickening. A lateral right abdominal wall fluid collection measures 4.0 x 3.0 cm on image 54/series 2. Otherwise normal small bowel. Vascular/Lymphatic: Advanced aortic and branch vessel atherosclerosis. No abdominopelvic adenopathy. Reproductive: Radiation seeds in the prostate. Other: No significant free fluid. A fat containing left inguinal hernia Musculoskeletal: Proximal left femoral fixation. Osteopenia. Degenerative partial fusion of the bilateral sacroiliac joints. Left sacral bone island. Diffuse idiopathic skeletal hyperostosis versus ankylosing spondylitis. IMPRESSION: 1. Low sensitivity exam secondary lack of oral or IV contrast. 2. Interval right nephrectomy. Right intra-abdominal edema and extraluminal gas with abdominal wall fluid  collection. This is suspicious for infection. This could be postoperative or related to the ascending colon. A colonic source is felt less likely, given absence of significant colonic wall thickening. 3. Progressive splenomegaly. 4. No evidence of metastatic disease from prostate cancer. Electronically Signed   By: Abigail Miyamoto M.D.   On: 05/11/2015 17:42     Assessment/Plan Principal Problem:   Intra-abdominal infection Active Problems:   CAD, s/p CABG 2013   ESRD (end stage renal disease) on dialysis ([facility])   Blind   Chronic anemia   1. Abdominal pain with CT scan showing intra-abdominal source of infection with extraluminal air - appreciate surgery consult. Dr. Barkley Bruns has requested CT scan with contrast which has been ordered. Until then patient will be nothing by mouth with IV Zosyn. Follow urine cultures. 2. ESRD on hemodialysis on Monday Wednesday and Friday - consult nephrology for dialysis. 3. Chronic anemia probably from ESRD - follow CBC. 4. CAD status post CABG - denies any chest pain. 5. Diabetes mellitus type 2 - patient will be placed on sliding scale insulin coverage. 6. Blind in both eyes.  Patient will be transferred to [facility] for further management. Dr. Blaine Hamper will be the accepting physician. Patient is agreeable to transfer.  DVT Prophylaxis SCDs.  Code Status: Full code.  Family Communication: Patient's wife.  Disposition Plan: Admit to inpatient.    KAKRAKANDY,ARSHAD N. [facility] Pager [phone].  If 7PM-7AM, please contact night-coverage [email or web address] Password [email or web address] 05/11/2015, 9:09 PM

## 2015-05-11 NOTE — Progress Notes (Signed)
Pharmacy Antibiotic Note  [name] is a 65 y.o. male admitted on 05/11/2015 with possible intra-abdominal infection.  Pharmacy has been consulted for Zosyn dosing.  PMH significant for ESRD on HD MWF so will be transferred to [facility] for inpatient admission.  Zosyn 3.375g IV x 1 given in ED.  Plan: Zosyn 2.25g IV q8h.     Temp (24hrs), Avg:99.5 F (37.5 C), Min:99.4 F (37.4 C), Max:99.6 F (37.6 C)   Recent Labs Lab 05/11/15 1620  WBC 9.4  CREATININE 4.51*    CrCl cannot be calculated (Unknown ideal weight.).    Allergies  Allergen Reactions  . Codeine Other (See Comments)    Makes patient incoherent. STATES MAKES HIM COMATOSE  . Tape Other (See Comments)    Plastic tape tears skin off, please use paper tape instead.    Antimicrobials this admission: 2/6 Zosyn >>   Dose adjustments this admission: -  Microbiology results: No cultures ordered.  Thank you for allowing pharmacy to be a part of this patient's care.  Hershal Coria 05/11/2015 7:34 PM

## 2015-05-11 NOTE — ED Provider Notes (Signed)
CSN: [id number]     Arrival date & time 05/11/15  1458 History   First MD Initiated Contact with Patient 05/11/15 1610     Chief Complaint  Patient presents with  . Abdominal Pain     (Consider location/radiation/quality/duration/timing/severity/associated sxs/prior Treatment) HPI   [name] is a 65 y.o. male with PMH significant for CAD, HLD, anemia, blindness, HTN, hypotension, DM, ESRD on dialysis (last dialysis today), prostate cancer, RCC who presents with constant, sharp RLQ and lower abdominal pain x 3 weeks that is made worse with coughing.  No meds PTA.  No relieving factors.  He reports at baseline he makes little urine.  He repeatedly states "it would just be better if I could just urinate". Associated symptoms include the urge to urinate, mild right flank pain, and chills.  Denies CP, SOB, fever, bloody stools, N/V.  Recent surgical history remarkable for right nephrectomy 01/30/15 at [facility].    Past Medical History  Diagnosis Date  . CAD, NATIVE VESSEL 01/08/2008       . HYPERLIPIDEMIA-MIXED 01/08/2008  . OVERWEIGHT/OBESITY 01/08/2008    Lost 205 lbs through diet and exercise.    . Anemia   . Bruises easily   . Blind     both eyes removed   . Family history of breast cancer     mother  . Cellulitis late 1980's    "hospitalized; wrapped both legs; several times; no OR for this"  . Peripheral vascular disease ([place])   . Colon polyps   . HYPERTENSION 01/27/2009    BP low  . Hypotension   . Pneumonia 2000;s X 1  . DM type 2 (diabetes mellitus, type 2) ([place])     no medications (07/28/2014)  . Arthritis     "back, neck" (07/28/2014)  . ESRD (end stage renal disease) on dialysis [facility])     [name]; Mon, Wed, Fri (07/28/2014)  . Renal insufficiency   . Prostate cancer ([place])   . Renal cell carcinoma 2001 and 2003    "both kidneys"   Past Surgical History  Procedure Laterality Date  . Foot amputation Right ~ 2002    right;  partial; "infection"  .  Colonoscopy  06/14/2011    Procedure: COLONOSCOPY;  Surgeon: [name], MD;  Location: [facility];  Service: Endoscopy;  Laterality: N/A;  . Hot hemostasis  06/14/2011    Procedure: HOT HEMOSTASIS (ARGON PLASMA COAGULATION/BICAP);  Surgeon: [name], MD;  Location: [facility];  Service: Endoscopy;  Laterality: N/A;  . Av fistula placement Left 02/2010    LFA  . Varicose vein surgery  mid 1980's     BLE; "knees down; both legs; 2 separate times"  . Radiofrequency ablation kidney  2010-2012    "twice; one on each side; for cancer"  . Coronary artery bypass graft  09/29/2011    Procedure: CORONARY ARTERY BYPASS GRAFTING (CABG);  Surgeon: [name], MD;  Location: [place];  Service: Open Heart Surgery;  Laterality: N/A;  Coronary Artery Bypass Graft times two utilizing the left internal mammary artery and the right greater saphenous vein harvested endoscopically.  . Colonoscopy N/A 07/24/2012    Procedure: COLONOSCOPY;  Surgeon: [name], MD;  Location: [facility];  Service: Endoscopy;  Laterality: N/A;  . Hip pinning,cannulated Left 12/31/2013    Procedure: CANNULATED HIP PINNING;  Surgeon: Renette Butters, MD;  Location: [place];  Service: Orthopedics;  Laterality: Left;  Carm, FX Table, [employer]  . Left  heart catheterization with coronary angiogram N/A 09/27/2011    Procedure: LEFT HEART CATHETERIZATION WITH CORONARY ANGIOGRAM;  Surgeon: [name], MD;  Location: [facility] C[facility] LAB;  Service: Cardiovascular;  Laterality: N/A;  . Colonoscopy with propofol N/A 05/13/2014    Procedure: COLONOSCOPY WITH PROPOFOL;  Surgeon: [name], MD;  Location: [facility];  Service: Endoscopy;  Laterality: N/A;  . Colonoscopy    . Enucleation  2003; 2006    bilateral; "diabetes; pain"  . Fracture surgery    . Cholecystectomy open  1992  . Insertion prostate radiation seed  03/2012  . Cystoscopy with retrograde pyelogram, ureteroscopy and stent placement Left 07/28/2014     Procedure: CYSTOSCOPY WITH RETROGRADE PYELOGRAM, URETERAL BALLOON DILITATION, URETEROSCOPY AND LEFT STENT PLACEMENT;  Surgeon: [name], MD;  Location: [facility];  Service: Urology;  Laterality: Left;  . Ligation of competing branches of arteriovenous fistula Left 07/29/2014    Procedure: LIGATION OF COMPETING BRANCHES OF LEFT ARM ARTERIOVENOUS FISTULA;  Surgeon: [name], MD;  Location: [address];  Service: Vascular;  Laterality: Left;  . Nephrectomy Right 01/30/2015    done at [place] History  Problem Relation Age of Onset  . Cancer    . Coronary artery disease    . Kidney disease    . Breast cancer    . Ovarian cancer    . Lung cancer    . Diabetes    . Cancer Mother 66    breast, spine and ovarian  . Heart disease Mother   . Hyperlipidemia Mother   . Hypertension Mother   . Varicose Veins Mother   . Emphysema Father   . Cancer Father 55    kidney, prostate  . Heart disease Father   . Hyperlipidemia Father   . Hypertension Father   . Cancer Sister     ovarian  . Cancer Brother     lung  . Heart disease Brother   . Hyperlipidemia Brother   . Hypertension Brother   . Heart attack Brother   . Peripheral vascular disease Brother   . Malignant hyperthermia Neg Hx    Social History  Substance Use Topics  . Smoking status: Never Smoker   . Smokeless tobacco: Never Used  . Alcohol Use: No    Review of Systems All other systems negative unless otherwise stated in HPI    Allergies  Codeine and Tape  Home Medications   Prior to Admission medications   Medication Sig Start Date End Date Taking? Authorizing Provider  aspirin 81 MG tablet Take 81 mg by mouth daily.   Yes Historical Provider, MD  atorvastatin (LIPITOR) 10 MG tablet TAKE 1 TABLET BY MOUTH EVERY DAY Patient taking differently: Take 1 tablet by mouth every day. 05/13/14  Yes Thayer Headings, MD  b complex-vitamin c-folic acid (NEPHRO-VITE) 0.8 MG TABS Take 0.8 mg by mouth at bedtime.   Yes  Historical Provider, MD  calcitRIOL (ROCALTROL) 0.25 MCG capsule Take 0.25 mcg by mouth 3 (three) times a week. Monday, Wednesday, and Friday.   Yes Historical Provider, MD  heparin 5000 UNIT/ML injection Inject 8,000 Units into the skin 3 (three) times a week. Monday, Wednesday, and Friday.   Yes Historical Provider, MD  Methoxy PEG-Epoetin Beta (MIRCERA) 50 MCG/0.3ML SOSY Inject 225 mcg as directed every 14 (fourteen) days.    Yes Historical Provider, MD  midodrine (PROAMATINE) 10 MG tablet Take 10 mg by mouth 2 (two) times daily.   Yes  Historical Provider, MD  multivitamin (RENA-VIT) TABS tablet Take 1 tablet by mouth at bedtime. 02/15/15  Yes Orson Eva, MD  sevelamer carbonate (RENVELA) 800 MG tablet Take 4,000 mg by mouth 3 (three) times daily with meals.  06/26/14  Yes Historical Provider, MD  Nutritional Supplements (FEEDING SUPPLEMENT, NEPRO CARB STEADY,) LIQD Take 237 mLs by mouth 2 (two) times daily between meals. Patient not taking: Reported on 05/11/2015 02/15/15   Orson Eva, MD   BP 103/55 mmHg  Pulse 81  Temp(Src) 99.4 F (37.4 C) (Oral)  Resp 26  SpO2 93% Physical Exam  Constitutional: He is oriented to person, place, and time. He appears well-developed and well-nourished. He appears distressed.  Patient answering questions and cooperative, but moaning throughout history and physical.   HENT:  Head: Normocephalic and atraumatic.  Mouth/Throat: Oropharynx is clear and moist.  Neck: Normal range of motion. Neck supple.  Cardiovascular: Normal rate, regular rhythm and normal heart sounds.   No murmur heard. Pulmonary/Chest: Effort normal and breath sounds normal. No accessory muscle usage or stridor. No respiratory distress. He has no wheezes. He has no rhonchi. He has no rales.  Abdominal: Soft. Bowel sounds are normal. He exhibits no distension. There is tenderness in the right upper quadrant and right lower quadrant. There is no rebound and no guarding.  Musculoskeletal: Normal  range of motion.  Lymphadenopathy:    He has no cervical adenopathy.  Neurological: He is alert and oriented to person, place, and time.  Speech clear without dysarthria.  Skin: Skin is warm and dry.  Psychiatric: He has a normal mood and affect. His behavior is normal.    ED Course  Procedures (including critical care time) Labs Review Labs Reviewed  COMPREHENSIVE METABOLIC PANEL - Abnormal; Notable for the following:    Potassium 3.0 (*)    Chloride 95 (*)    Glucose, Bld 219 (*)    BUN 39 (*)    Creatinine, Ser 4.51 (*)    Calcium 8.8 (*)    Total Protein 6.1 (*)    Albumin 2.7 (*)    AST 13 (*)    ALT 10 (*)    GFR calc non Af Amer 12 (*)    GFR calc Af Amer 14 (*)    All other components within normal limits  CBC - Abnormal; Notable for the following:    RBC 2.81 (*)    Hemoglobin 8.2 (*)    HCT 26.7 (*)    Platelets 148 (*)    All other components within normal limits  LIPASE, BLOOD  URINALYSIS, ROUTINE W REFLEX MICROSCOPIC (NOT AT [facility])    Imaging Review Dg Chest 2 View  05/11/2015  CLINICAL DATA:  Abdominal pain.  Renal failure patient. EXAM: CHEST  2 VIEW COMPARISON:  02/13/2015. FINDINGS: Cardiomegaly. Prior CABG. Low lung volumes. Mild vascular congestion. Chronic RIGHT hemidiaphragm elevation. No overt infiltrates or failure. No effusion or visible pneumothorax. IMPRESSION: Cardiomegaly. Mild vascular congestion. Slight worsening aeration from priors. Electronically Signed   By: Staci Righter M.D.   On: 05/11/2015 17:15   Ct Renal Stone Study  05/11/2015  CLINICAL DATA:  Lower right quadrant pain and abdominal pain. Abdominal tenderness. On dialysis. Diabetic. History of renal cell carcinoma, status post right nephrectomy 10/16. EXAM: CT ABDOMEN AND PELVIS WITHOUT CONTRAST TECHNIQUE: Multidetector CT imaging of the abdomen and pelvis was performed following the standard protocol without IV contrast. COMPARISON:  10/01/2014 FINDINGS: Lower chest: right base  atelectasis. Mild cardiomegaly with prior  median sternotomy. Native coronary artery atherosclerosis. Hepatobiliary: Normal liver. Cholecystectomy, without biliary ductal dilatation. Pancreas: Pancreatic atrophy. Spleen: Progressive splenomegaly, including 17.8 cm today versus 16.4 cm on 10/01/2014. Adrenals/Urinary Tract: Normal adrenal glands. Left renal cortical thinning with too small to characterize lesions within the left kidney. Left renal vascular calcifications. Interval right nephrectomy. Normal urinary bladder. Stomach/Bowel: Normal stomach, without wall thickening. Periampullary duodenal diverticulum. Scattered colonic diverticula. Normal terminal ileum and appendix. extensive edema posterior and lateral to the ascending colon, including on image 51/ series 2. Foci of extraluminal gas are identified including on image 49/ series 2. No convincing evidence of adjacent colonic wall thickening. A lateral right abdominal wall fluid collection measures 4.0 x 3.0 cm on image 54/series 2. Otherwise normal small bowel. Vascular/Lymphatic: Advanced aortic and branch vessel atherosclerosis. No abdominopelvic adenopathy. Reproductive: Radiation seeds in the prostate. Other: No significant free fluid. A fat containing left inguinal hernia Musculoskeletal: Proximal left femoral fixation. Osteopenia. Degenerative partial fusion of the bilateral sacroiliac joints. Left sacral bone island. Diffuse idiopathic skeletal hyperostosis versus ankylosing spondylitis. IMPRESSION: 1. Low sensitivity exam secondary lack of oral or IV contrast. 2. Interval right nephrectomy. Right intra-abdominal edema and extraluminal gas with abdominal wall fluid collection. This is suspicious for infection. This could be postoperative or related to the ascending colon. A colonic source is felt less likely, given absence of significant colonic wall thickening. 3. Progressive splenomegaly. 4. No evidence of metastatic disease from prostate cancer.  Electronically Signed   By: Abigail Miyamoto M.D.   On: 05/11/2015 17:42   I have personally reviewed and evaluated these images and lab results as part of my medical decision-making.   EKG Interpretation None      MDM   Final diagnoses:  Intra-abdominal infection    Patient presents with right sided abdominal pain x 3 weeks.  VSS, NAD.  He appears distressed and uncomfortable.  He is moaning throughout the H&P.  On exam, heart RRR, lungs CTAB, abdomen soft with tenderness in RLQ and RUQ.  No rebound, guarding, or rigidity.  Will obtain labs and renal stone study to evaluate for nephrolithiasis or intra-abdominal pathology.   Labs remarkable for K 3, Cl 95, Cr 4.51, BUN 29, WBC 9.4, hgb 8.2 CT renal stone study remarkable for right nephrectomy.  Right intra-abdominal edema and extraluminal gas with abdominal wall fluid collection, suspicious for infection.  This could be postoperative or related to the ascending colon.  A colonic source is felt less likely, given absence of significant colonic wall thickening. UA remarkable for large leukocytes after an in-and-out cath was obtained and large amount of pus drained.  Will consult general surgery. Per Dr. Zella Richer with general surgery, likely post-op infection. However, need PO contrast CT to obtain more information regarding infection.  Start broad spectrum abx.   Patient started on IV zosyn, PO contrast abdominal CT ordered.   Dr. Tomie China will admit at [facility] for IV abx.  Spoke with admitting physician Dr. Tomie China, patient was not able to have PO contrast abdomen/pelvis CT before transfer, so patient will have this study performed at [facility].  Case has been discussed with and seen by Dr. Tamera Punt who agrees with the above plan for admission.         Gloriann Loan, PA-C 05/11/15 1935  Gloriann Loan, PA-C 05/11/15 2104  Malvin Johns, MD 05/12/15 [phone]

## 2015-05-11 NOTE — ED Notes (Signed)
MD at bedside. 

## 2015-05-12 ENCOUNTER — Inpatient Hospital Stay (HOSPITAL_COMMUNITY): Payer: Medicare Other

## 2015-05-12 DIAGNOSIS — B999 Unspecified infectious disease: Secondary | ICD-10-CM

## 2015-05-12 LAB — CBC WITH DIFFERENTIAL/PLATELET
Basophils Absolute: 0 10*3/uL (ref 0.0–0.1)
Basophils Relative: 0 %
EOS PCT: 1 %
Eosinophils Absolute: 0.1 10*3/uL (ref 0.0–0.7)
HEMATOCRIT: 26.3 % — AB (ref 39.0–52.0)
Hemoglobin: 8.1 g/dL — ABNORMAL LOW (ref 13.0–17.0)
LYMPHS ABS: 0.4 10*3/uL — AB (ref 0.7–4.0)
LYMPHS PCT: 5 %
MCH: 29.9 pg (ref 26.0–34.0)
MCHC: 30.8 g/dL (ref 30.0–36.0)
MCV: 97 fL (ref 78.0–100.0)
MONO ABS: 0.4 10*3/uL (ref 0.1–1.0)
Monocytes Relative: 6 %
NEUTROS ABS: 5.8 10*3/uL (ref 1.7–7.7)
Neutrophils Relative %: 88 %
PLATELETS: 121 10*3/uL — AB (ref 150–400)
RBC: 2.71 MIL/uL — ABNORMAL LOW (ref 4.22–5.81)
RDW: 15.8 % — AB (ref 11.5–15.5)
WBC: 6.6 10*3/uL (ref 4.0–10.5)

## 2015-05-12 LAB — COMPREHENSIVE METABOLIC PANEL
ALBUMIN: 2.3 g/dL — AB (ref 3.5–5.0)
ALT: 12 U/L — ABNORMAL LOW (ref 17–63)
ANION GAP: 14 (ref 5–15)
AST: 11 U/L — ABNORMAL LOW (ref 15–41)
Alkaline Phosphatase: 91 U/L (ref 38–126)
BUN: 49 mg/dL — AB (ref 6–20)
CHLORIDE: 94 mmol/L — AB (ref 101–111)
CO2: 31 mmol/L (ref 22–32)
Calcium: 9.4 mg/dL (ref 8.9–10.3)
Creatinine, Ser: 5.79 mg/dL — ABNORMAL HIGH (ref 0.61–1.24)
GFR calc Af Amer: 11 mL/min — ABNORMAL LOW (ref >60)
GFR calc non Af Amer: 9 mL/min — ABNORMAL LOW (ref >60)
GLUCOSE: 110 mg/dL — AB (ref 65–99)
POTASSIUM: 3.4 mmol/L — AB (ref 3.5–5.1)
SODIUM: 139 mmol/L (ref 135–145)
Total Bilirubin: 0.8 mg/dL (ref 0.3–1.2)
Total Protein: 5.8 g/dL — ABNORMAL LOW (ref 6.5–8.1)

## 2015-05-12 LAB — GLUCOSE, CAPILLARY
GLUCOSE-CAPILLARY: 78 mg/dL (ref 65–99)
Glucose-Capillary: 106 mg/dL — ABNORMAL HIGH (ref 65–99)
Glucose-Capillary: 109 mg/dL — ABNORMAL HIGH (ref 65–99)
Glucose-Capillary: 112 mg/dL — ABNORMAL HIGH (ref 65–99)
Glucose-Capillary: 96 mg/dL (ref 65–99)

## 2015-05-12 MED ORDER — SODIUM CHLORIDE 0.9 % IV SOLN
INTRAVENOUS | Status: DC
  Administered 2015-05-12: 13:00:00 via INTRAVENOUS

## 2015-05-12 MED ORDER — IOHEXOL 300 MG/ML  SOLN
75.0000 mL | Freq: Once | INTRAMUSCULAR | Status: AC | PRN
  Administered 2015-05-12: 75 mL via INTRAVENOUS

## 2015-05-12 NOTE — Care Management Note (Signed)
Case Management Note  Patient Details  Name: PARIS BARB MRN: [id number] Date of Birth: 1950-05-14  Subjective/Objective:                    Action/Plan: Patient was admitted for intra-abdominal infection. Lives at home with spouse. Will follow for discharge needs. Expected Discharge Date:                  Expected Discharge Plan:     In-House Referral:     Discharge planning Services     Post Acute Care Choice:    Choice offered to:     DME Arranged:    DME Agency:     HH Arranged:    HH Agency:     Status of Service:  In process, will continue to follow  Medicare Important Message Given:    Date Medicare IM Given:    Medicare IM give by:    Date Additional Medicare IM Given:    Additional Medicare Important Message give by:     If discussed at [place] of Stay Meetings, dates discussed:    Additional CommentsRolm Baptise, RN 05/12/2015, 12:01 PM [phone]

## 2015-05-12 NOTE — Progress Notes (Signed)
Pt transferred via stretcher to [facility] via [place]. Wife took all personal belongings with her home. Pt with no noted distress. Report called in to nurse, Conception Oms.

## 2015-05-12 NOTE — Progress Notes (Signed)
Called [facility] spoke with Merlin stated that pt has been placed in the system but no bed available at this time. She stated it could be hours or days but she would call back when bed was available for pt transfer.

## 2015-05-12 NOTE — Discharge Summary (Signed)
Patient Demographics  [name]   [id number]  [id number]  is a 65 y.o. male  DOB - 11-Oct-1950  05/11/2015  Transfer Date 05/12/2015  Admitting Physician Ivor Costa, MD  Outpatient Primary MD for the patient is [name], MD    Place of Transfer [facility] Accepting MD [name] Condition Stable  Admission Diagnosis  Intra-abdominal infection [B99.9]  Discharge Diagnosis    Intraabdominal Abscess  Past Medical History  Diagnosis Date  . CAD, NATIVE VESSEL 01/08/2008       . HYPERLIPIDEMIA-MIXED 01/08/2008  . OVERWEIGHT/OBESITY 01/08/2008    Lost 205 lbs through diet and exercise.    . Anemia   . Bruises easily   . Blind     both eyes removed   . Family history of breast cancer     mother  . Cellulitis late 1980's    "hospitalized; wrapped both legs; several times; no OR for this"  . Peripheral vascular disease ([place])   . Colon polyps   . HYPERTENSION 01/27/2009    BP low  . Hypotension   . Pneumonia 2000;s X 1  . DM type 2 (diabetes mellitus, type 2) ([place])     no medications (07/28/2014)  . Arthritis     "back, neck" (07/28/2014)  . ESRD (end stage renal disease) on dialysis [facility])     [name]; Mon, Wed, Fri (07/28/2014)  . Renal insufficiency   . Prostate cancer ([place])   . Renal cell carcinoma 2001 and 2003    "both kidneys"    Past Surgical History  Procedure Laterality Date  . Foot amputation Right ~ 2002    right;  partial; "infection"  . Colonoscopy  06/14/2011    Procedure: COLONOSCOPY;  Surgeon: [name], MD;  Location: [facility];  Service: Endoscopy;  Laterality: N/A;  . Hot hemostasis  06/14/2011    Procedure: HOT HEMOSTASIS (ARGON PLASMA COAGULATION/BICAP);  Surgeon: [name], MD;  Location: [facility];  Service: Endoscopy;  Laterality: N/A;  . Av fistula placement Left 02/2010    LFA  .  Varicose vein surgery  mid 1980's     BLE; "knees down; both legs; 2 separate times"  . Radiofrequency ablation kidney  2010-2012    "twice; one on each side; for cancer"  . Coronary artery bypass graft  09/29/2011    Procedure: CORONARY ARTERY BYPASS GRAFTING (CABG);  Surgeon: [name], MD;  Location: [place];  Service: Open Heart Surgery;  Laterality: N/A;  Coronary Artery Bypass Graft times two utilizing the left internal mammary artery and the right greater saphenous vein harvested endoscopically.  . Colonoscopy N/A 07/24/2012    Procedure: COLONOSCOPY;  Surgeon: [name], MD;  Location: [facility];  Service: Endoscopy;  Laterality: N/A;  . Hip pinning,cannulated Left 12/31/2013    Procedure: CANNULATED HIP PINNING;  Surgeon: Renette Butters, MD;  Location: [place];  Service: Orthopedics;  Laterality: Left;  Carm, FX Table, [employer]  . Left heart catheterization with coronary angiogram N/A 09/27/2011    Procedure: LEFT HEART CATHETERIZATION WITH CORONARY ANGIOGRAM;  Surgeon: [name], MD;  Location: [facility] CATH LAB;  Service: Cardiovascular;  Laterality: N/A;  . Colonoscopy with propofol N/A 05/13/2014    Procedure: COLONOSCOPY WITH PROPOFOL;  Surgeon: [name], MD;  Location: [facility];  Service: Endoscopy;  Laterality: N/A;  . Colonoscopy    . Enucleation  2003; 2006    bilateral; "diabetes; pain"  .  Fracture surgery    . Cholecystectomy open  1992  . Insertion prostate radiation seed  03/2012  . Cystoscopy with retrograde pyelogram, ureteroscopy and stent placement Left 07/28/2014    Procedure: CYSTOSCOPY WITH RETROGRADE PYELOGRAM, URETERAL BALLOON DILITATION, URETEROSCOPY AND LEFT STENT PLACEMENT;  Surgeon: [name], MD;  Location: [facility];  Service: Urology;  Laterality: Left;  . Ligation of competing branches of arteriovenous fistula Left 07/29/2014    Procedure: LIGATION OF COMPETING BRANCHES OF LEFT ARM ARTERIOVENOUS FISTULA;  Surgeon: [name], MD;   Location: [place];  Service: Vascular;  Laterality: Left;  . Nephrectomy Right 01/30/2015    done at [facility]  Significant Tests   Ct Abdomen Pelvis Wo Contrast  05/12/2015  ADDENDUM REPORT: 05/12/2015 12:24 ADDENDUM: Study discussed by telephone with Dr. Candiss Norse on 05/12/2015 at 1214 hours. He advises the patient is currently asymptomatic. Therefore, it seems unlikely that the abnormal soft tissue changes which track from the right nephrectomy site to the superficial right abdominal wall would include abscess. However, the associated gas could not be explained by the nephrectomy in October. Perhaps this represents a postoperative colon infection with microperforation which spontaneously healed. Regardless, I think surgical evaluation for consideration of exploration washout of the affected area would be valuable. Electronically Signed   By: Genevie Ann M.D.   On: 05/12/2015 12:24  05/12/2015  CLINICAL DATA:  65 year old male with right abdominal pain found have right side intra abdominal edema, extraluminal gas, abdominal wall fluid collection which may be related to the right colon. Current history of dialysis, status post right nephrectomy in October for renal cell carcinoma. Initial encounter. EXAM: CT ABDOMEN AND PELVIS WITHOUT CONTRAST TECHNIQUE: Multidetector CT imaging of the abdomen and pelvis was performed following the standard protocol without IV contrast. COMPARISON:  Noncontrast CT Abdomen and Pelvis 05/11/2015 and earlier FINDINGS: Repeat CT Abdomen and Pelvis was performed initially at 2316 hours on 05/11/2015, but that study did not have adequate contrast in the right colon. Therefore, the patient was brought back for repeat images after additional oral contrast on 05/12/2015 at 0902 hours. Increased bibasilar atelectasis. Superimposed spine parenchymal or pleural calcifications at the right lung base re- demonstrated. Sequelae of CABG. No pericardial or definite pleural  effusion. Osteopenia. Widespread ankylosis in the spine. Postoperative changes to the proximal left femur. Stable visualized osseous structures. Sequelae of prostate brachytherapy. Gas and low-density stool in the rectum. No pelvic free fluid. Negative urinary bladder. Distal colon is stable with redundancy and mild diverticulosis. Transverse colon is stable with redundancy. Oral contrast was administered and has reached the hepatic flexure. Negative stomach. No dilated small bowel loops. The terminal ileum is remarkable for several small diverticula without active inflammation. The appendix is normal. There is good oral contrast opacification of the right colon which is secondarily affected by the posterior pericolic process described on the noncontrast study yesterday. There is no leakage of contrast from the colon. The volume of extraluminal gas has not changed. There is no free intraperitoneal air otherwise. As before, the nodular inflammatory appearing process also appears to secondarily involved the second portion of the duodenum (series 2, image 49), with no evidence for leak at that site either. The extension of abnormal soft tissue thickening and stranding through the right lateral body wall on series 2, image 63 is stable. Surrounding right flank superficial soft tissue stranding. Stable noncontrast liver, spleen, pancreas, adrenal glands and left kidney. Surgically absent gallbladder and right  kidney as before. Aortoiliac calcified atherosclerosis noted. IMPRESSION: 1. Repeat CT Abdomen and Pelvis following oral contrast administration which has reached the hepatic flexure. No contrast leakage from the right colon, duodenum, or evidence of communication with the complex process tracking along the posterior right colon described on yesterday noncontrast study. Therefore, favor a complex infectious or necrotic process originating separate from but secondarily involving the right colon and duodenum. Once  again, that process appears contiguous with abnormal soft tissue in fluid tracking through the right abdominal wall. 2. Otherwise stable CT appearance of the abdomen and pelvis. 3. Increased pulmonary atelectasis. Electronically Signed: By: Genevie Ann M.D. On: 05/12/2015 11:45   Dg Chest 2 View  05/11/2015  CLINICAL DATA:  Abdominal pain.  Renal failure patient. EXAM: CHEST  2 VIEW COMPARISON:  02/13/2015. FINDINGS: Cardiomegaly. Prior CABG. Low lung volumes. Mild vascular congestion. Chronic RIGHT hemidiaphragm elevation. No overt infiltrates or failure. No effusion or visible pneumothorax. IMPRESSION: Cardiomegaly. Mild vascular congestion. Slight worsening aeration from priors. Electronically Signed   By: Staci Righter M.D.   On: 05/11/2015 17:15   Ct Renal Stone Study  05/11/2015  CLINICAL DATA:  Lower right quadrant pain and abdominal pain. Abdominal tenderness. On dialysis. Diabetic. History of renal cell carcinoma, status post right nephrectomy 10/16. EXAM: CT ABDOMEN AND PELVIS WITHOUT CONTRAST TECHNIQUE: Multidetector CT imaging of the abdomen and pelvis was performed following the standard protocol without IV contrast. COMPARISON:  10/01/2014 FINDINGS: Lower chest: right base atelectasis. Mild cardiomegaly with prior median sternotomy. Native coronary artery atherosclerosis. Hepatobiliary: Normal liver. Cholecystectomy, without biliary ductal dilatation. Pancreas: Pancreatic atrophy. Spleen: Progressive splenomegaly, including 17.8 cm today versus 16.4 cm on 10/01/2014. Adrenals/Urinary Tract: Normal adrenal glands. Left renal cortical thinning with too small to characterize lesions within the left kidney. Left renal vascular calcifications. Interval right nephrectomy. Normal urinary bladder. Stomach/Bowel: Normal stomach, without wall thickening. Periampullary duodenal diverticulum. Scattered colonic diverticula. Normal terminal ileum and appendix. extensive edema posterior and lateral to the ascending  colon, including on image 51/ series 2. Foci of extraluminal gas are identified including on image 49/ series 2. No convincing evidence of adjacent colonic wall thickening. A lateral right abdominal wall fluid collection measures 4.0 x 3.0 cm on image 54/series 2. Otherwise normal small bowel. Vascular/Lymphatic: Advanced aortic and branch vessel atherosclerosis. No abdominopelvic adenopathy. Reproductive: Radiation seeds in the prostate. Other: No significant free fluid. A fat containing left inguinal hernia Musculoskeletal: Proximal left femoral fixation. Osteopenia. Degenerative partial fusion of the bilateral sacroiliac joints. Left sacral bone island. Diffuse idiopathic skeletal hyperostosis versus ankylosing spondylitis. IMPRESSION: 1. Low sensitivity exam secondary lack of oral or IV contrast. 2. Interval right nephrectomy. Right intra-abdominal edema and extraluminal gas with abdominal wall fluid collection. This is suspicious for infection. This could be postoperative or related to the ascending colon. A colonic source is felt less likely, given absence of significant colonic wall thickening. 3. Progressive splenomegaly. 4. No evidence of metastatic disease from prostate cancer. Electronically Signed   By: Abigail Miyamoto M.D.   On: 05/11/2015 17:42    Hospital Course See H&P for all details in brief,    1. Abdominal pain with CT scan showing intra-abdominal source of infection with extraluminal air -Seen by Surgery they requested [facility] transfer Dr Bennie Hind accepts the patient. Until then patient will be nothing by mouth with IV Zosyn. Follow  cultures. 2. ESRD on hemodialysis on Monday Wednesday and Friday - consulted nephrology for dialysis.Last run yesterday. 3. Chronic anemia probably from  ESRD - follow CBC. 4. CAD status post CABG - denies any chest pain. 5. Diabetes mellitus type 2 - patient will be placed on sliding scale insulin coverage. 6. Blind in both eyes.   Today   Subjective:    [name] today has no headache,no chest pain,no new weakness tingling or numbness,  Mild R.sided Abd pain  Objective:   Blood pressure 108/52, pulse 81, temperature 102 F (38.9 C), temperature source Oral, resp. rate 16, height 6\' 4"  (1.93 m), weight 100.8 kg (222 lb 3.6 oz), SpO2 93 %.  Intake/Output Summary (Last 24 hours) at 05/12/15 1257 Last data filed at 05/11/15 2259  Gross per 24 hour  Intake      3 ml  Output    350 ml  Net   -347 ml    Exam Awake Alert, Oriented *3, No new F.N deficits, Normal affect [place].AT,PERRAL Supple Neck,No JVD, No cervical lymphadenopathy appriciated.  Symmetrical Chest wall movement, Good air movement bilaterally, CTAB RRR,No Gallops,Rubs or new Murmurs, No Parasternal Heave +ve B.Sounds, Abd Soft, mild R sided tenderness, No organomegaly appriciated, No rebound -guarding or rigidity. No Cyanosis, Clubbing or edema, No new Rash or bruise  Data Review  CBC w Diff: Lab Results  Component Value Date   WBC 6.6 05/12/2015   HGB 8.1* 05/12/2015   HCT 26.3* 05/12/2015   PLT 121* 05/12/2015   LYMPHOPCT 5 05/12/2015   MONOPCT 6 05/12/2015   EOSPCT 1 05/12/2015   BASOPCT 0 05/12/2015   CMP: Lab Results  Component Value Date   NA 139 05/12/2015   K 3.4* 05/12/2015   CL 94* 05/12/2015   CO2 31 05/12/2015   BUN 49* 05/12/2015   CREATININE 5.79* 05/12/2015   CREATININE 4.43* 09/29/2014   PROT 5.8* 05/12/2015   ALBUMIN 2.3* 05/12/2015   BILITOT 0.8 05/12/2015   ALKPHOS 91 05/12/2015   AST 11* 05/12/2015   ALT 12* 05/12/2015  .  Prior to Admission medications   Medication Sig Start Date End Date Taking? Authorizing Provider  aspirin 81 MG tablet Take 81 mg by mouth daily.   Yes Historical Provider, MD  atorvastatin (LIPITOR) 10 MG tablet TAKE 1 TABLET BY MOUTH EVERY DAY Patient taking differently: Take 1 tablet by mouth every day. 05/13/14  Yes Thayer Headings, MD  b complex-vitamin c-folic acid (NEPHRO-VITE) 0.8 MG TABS Take 0.8 mg  by mouth at bedtime.   Yes Historical Provider, MD  calcitRIOL (ROCALTROL) 0.25 MCG capsule Take 0.25 mcg by mouth 3 (three) times a week. Monday, Wednesday, and Friday.   Yes Historical Provider, MD  heparin 5000 UNIT/ML injection Inject 8,000 Units into the skin 3 (three) times a week. Monday, Wednesday, and Friday.   Yes Historical Provider, MD  Methoxy PEG-Epoetin Beta (MIRCERA) 50 MCG/0.3ML SOSY Inject 225 mcg as directed every 14 (fourteen) days.    Yes Historical Provider, MD  midodrine (PROAMATINE) 10 MG tablet Take 10 mg by mouth 2 (two) times daily.   Yes Historical Provider, MD  multivitamin (RENA-VIT) TABS tablet Take 1 tablet by mouth at bedtime. 02/15/15  Yes Orson Eva, MD  sevelamer carbonate (RENVELA) 800 MG tablet Take 4,000 mg by mouth 3 (three) times daily with meals.  06/26/14  Yes Historical Provider, MD  Nutritional Supplements (FEEDING SUPPLEMENT, NEPRO CARB STEADY,) LIQD Take 237 mLs by mouth 2 (two) times daily between meals. Patient not taking: Reported on 05/11/2015 02/15/15   Orson Eva, MD      Medication List  ASK your doctor about these medications        aspirin 81 MG tablet  Take 81 mg by mouth daily.     atorvastatin 10 MG tablet  Commonly known as:  LIPITOR  TAKE 1 TABLET BY MOUTH EVERY DAY     b complex-vitamin c-folic acid 0.8 MG Tabs tablet  Take 0.8 mg by mouth at bedtime.     multivitamin Tabs tablet  Take 1 tablet by mouth at bedtime.     calcitRIOL 0.25 MCG capsule  Commonly known as:  ROCALTROL  Take 0.25 mcg by mouth 3 (three) times a week. Monday, Wednesday, and Friday.     feeding supplement (NEPRO CARB STEADY) Liqd  Take 237 mLs by mouth 2 (two) times daily between meals.     heparin 5000 UNIT/ML injection  Inject 8,000 Units into the skin 3 (three) times a week. Monday, Wednesday, and Friday.     midodrine 10 MG tablet  Commonly known as:  PROAMATINE  Take 10 mg by mouth 2 (two) times daily.     MIRCERA 50 MCG/0.3ML Sosy   Generic drug:  Methoxy PEG-Epoetin Beta  Inject 225 mcg as directed every 14 (fourteen) days.     sevelamer carbonate 800 MG tablet  Commonly known as:  RENVELA  Take 4,000 mg by mouth 3 (three) times daily with meals.       Anti-infectives    Start     Dose/Rate Route Frequency Ordered Stop   05/12/15 0400  piperacillin-tazobactam (ZOSYN) IVPB 2.25 g     2.25 g 100 mL/hr over 30 Minutes Intravenous Every 8 hours 05/11/15 1943     05/11/15 1845  piperacillin-tazobactam (ZOSYN) IVPB 3.375 g     3.375 g 100 mL/hr over 30 Minutes Intravenous STAT 05/11/15 1832 05/11/15 1909       The risks and  benefits including possible death-disability of treating patient at this facility vs transporting the patinet to a higher level of care were discussed in detail with wife and the plan was acceptable to them.   Total Time in preparing paper work, todays exam and data evaluation 35 minutes  Thurnell Lose M.D on 05/12/2015 at 12:57 PM  [facility] Office  [phone]

## 2015-05-25 ENCOUNTER — Emergency Department (HOSPITAL_COMMUNITY)
Admission: EM | Admit: 2015-05-25 | Discharge: 2015-05-25 | Disposition: A | Discharge status: Home / Self Care | Payer: Medicare Other | Attending: Emergency Medicine | Admitting: Emergency Medicine

## 2015-05-25 ENCOUNTER — Encounter (HOSPITAL_COMMUNITY): Payer: Self-pay | Admitting: Emergency Medicine

## 2015-05-25 DIAGNOSIS — Z79899 Other long term (current) drug therapy: Secondary | ICD-10-CM | POA: Diagnosis not present

## 2015-05-25 DIAGNOSIS — Z86010 Personal history of colonic polyps: Secondary | ICD-10-CM | POA: Diagnosis not present

## 2015-05-25 DIAGNOSIS — E119 Type 2 diabetes mellitus without complications: Secondary | ICD-10-CM | POA: Diagnosis not present

## 2015-05-25 DIAGNOSIS — M1990 Unspecified osteoarthritis, unspecified site: Secondary | ICD-10-CM | POA: Insufficient documentation

## 2015-05-25 DIAGNOSIS — Y828 Other medical devices associated with adverse incidents: Secondary | ICD-10-CM | POA: Insufficient documentation

## 2015-05-25 DIAGNOSIS — Z7982 Long term (current) use of aspirin: Secondary | ICD-10-CM | POA: Insufficient documentation

## 2015-05-25 DIAGNOSIS — E663 Overweight: Secondary | ICD-10-CM | POA: Insufficient documentation

## 2015-05-25 DIAGNOSIS — I120 Hypertensive chronic kidney disease with stage 5 chronic kidney disease or end stage renal disease: Secondary | ICD-10-CM | POA: Insufficient documentation

## 2015-05-25 DIAGNOSIS — T85618A Breakdown (mechanical) of other specified internal prosthetic devices, implants and grafts, initial encounter: Secondary | ICD-10-CM

## 2015-05-25 DIAGNOSIS — Z8701 Personal history of pneumonia (recurrent): Secondary | ICD-10-CM | POA: Insufficient documentation

## 2015-05-25 DIAGNOSIS — Z8546 Personal history of malignant neoplasm of prostate: Secondary | ICD-10-CM | POA: Insufficient documentation

## 2015-05-25 DIAGNOSIS — T82520A Displacement of surgically created arteriovenous fistula, initial encounter: Secondary | ICD-10-CM | POA: Diagnosis not present

## 2015-05-25 DIAGNOSIS — H540 Blindness, both eyes: Secondary | ICD-10-CM | POA: Insufficient documentation

## 2015-05-25 DIAGNOSIS — E785 Hyperlipidemia, unspecified: Secondary | ICD-10-CM | POA: Diagnosis not present

## 2015-05-25 DIAGNOSIS — I2510 Atherosclerotic heart disease of native coronary artery without angina pectoris: Secondary | ICD-10-CM | POA: Insufficient documentation

## 2015-05-25 DIAGNOSIS — Z872 Personal history of diseases of the skin and subcutaneous tissue: Secondary | ICD-10-CM | POA: Insufficient documentation

## 2015-05-25 DIAGNOSIS — T85698A Other mechanical complication of other specified internal prosthetic devices, implants and grafts, initial encounter: Secondary | ICD-10-CM

## 2015-05-25 DIAGNOSIS — N186 End stage renal disease: Secondary | ICD-10-CM | POA: Diagnosis not present

## 2015-05-25 DIAGNOSIS — Z862 Personal history of diseases of the blood and blood-forming organs and certain disorders involving the immune mechanism: Secondary | ICD-10-CM | POA: Diagnosis not present

## 2015-05-25 DIAGNOSIS — Z85528 Personal history of other malignant neoplasm of kidney: Secondary | ICD-10-CM | POA: Diagnosis not present

## 2015-05-25 DIAGNOSIS — Z992 Dependence on renal dialysis: Secondary | ICD-10-CM | POA: Insufficient documentation

## 2015-05-25 NOTE — ED Notes (Signed)
Requesting JP drain from [employer].

## 2015-05-25 NOTE — ED Notes (Signed)
Pt states was trying to get in the car when the door hit his JP drain, bulb connected to drain noted to be unattached. Pt has no complaints at this time. nad noted.

## 2015-05-25 NOTE — Discharge Instructions (Signed)
Followup with your doctor as needed

## 2015-05-25 NOTE — ED Notes (Signed)
Pt legally blind in both eyes.

## 2015-05-25 NOTE — ED Provider Notes (Signed)
CSN: [id number]     Arrival date & time 05/25/15  1231 History   First MD Initiated Contact with Patient 05/25/15 1259     Chief Complaint  Patient presents with  . Post-op Problem     (Consider location/radiation/quality/duration/timing/severity/associated sxs/prior Treatment) HPI Comments: Patient here complaining of dislodged drain bulb which occurred just prior to arrival. Was getting out of his car in the bulb got caught on the door jam. Went to dialysis today for his full run. Denies any abdominal discomforts. Called his doctor and told to come here. Patient normally is mildly hypotensive. No treatment used prior to arrival other than the dialysis nurse turning off the drain  The history is provided by the patient.    Past Medical History  Diagnosis Date  . CAD, NATIVE VESSEL 01/08/2008       . HYPERLIPIDEMIA-MIXED 01/08/2008  . OVERWEIGHT/OBESITY 01/08/2008    Lost 205 lbs through diet and exercise.    . Anemia   . Bruises easily   . Blind     both eyes removed   . Family history of breast cancer     mother  . Cellulitis late 1980's    "hospitalized; wrapped both legs; several times; no OR for this"  . Peripheral vascular disease ([place])   . Colon polyps   . HYPERTENSION 01/27/2009    BP low  . Hypotension   . Pneumonia 2000;s X 1  . DM type 2 (diabetes mellitus, type 2) ([place])     no medications (07/28/2014)  . Arthritis     "back, neck" (07/28/2014)  . ESRD (end stage renal disease) on dialysis [facility])     [name]; Mon, Wed, Fri (07/28/2014)  . Renal insufficiency   . Prostate cancer ([place])   . Renal cell carcinoma 2001 and 2003    "both kidneys"   Past Surgical History  Procedure Laterality Date  . Foot amputation Right ~ 2002    right;  partial; "infection"  . Colonoscopy  06/14/2011    Procedure: COLONOSCOPY;  Surgeon: [name], MD;  Location: [facility];  Service: Endoscopy;  Laterality: N/A;  . Hot hemostasis  06/14/2011    Procedure: HOT HEMOSTASIS  (ARGON PLASMA COAGULATION/BICAP);  Surgeon: [name], MD;  Location: [facility];  Service: Endoscopy;  Laterality: N/A;  . Av fistula placement Left 02/2010    LFA  . Varicose vein surgery  mid 1980's     BLE; "knees down; both legs; 2 separate times"  . Radiofrequency ablation kidney  2010-2012    "twice; one on each side; for cancer"  . Coronary artery bypass graft  09/29/2011    Procedure: CORONARY ARTERY BYPASS GRAFTING (CABG);  Surgeon: [name], MD;  Location: [facility];  Service: Open Heart Surgery;  Laterality: N/A;  Coronary Artery Bypass Graft times two utilizing the left internal mammary artery and the right greater saphenous vein harvested endoscopically.  . Colonoscopy N/A 07/24/2012    Procedure: COLONOSCOPY;  Surgeon: [name], MD;  [facility]: [facility];  Service: Endoscopy;  Laterality: N/A;  . Hip pinning,cannulated Left 12/31/2013    Procedure: CANNULATED HIP PINNING;  Surgeon: Renette Butters, MD;  Location: [facility];  Service: Orthopedics;  Laterality: Left;  Carm, FX Table, [employer]  . Left heart catheterization with coronary angiogram N/A 09/27/2011    Procedure: LEFT HEART CATHETERIZATION WITH CORONARY ANGIOGRAM;  Surgeon: [name], MD;  Location: [facility] CATH LAB;  Service: Cardiovascular;  Laterality: N/A;  .  Colonoscopy with propofol N/A 05/13/2014    Procedure: COLONOSCOPY WITH PROPOFOL;  Surgeon: [name], MD;  [facility];  Service: Endoscopy;  Laterality: N/A;  . Colonoscopy    . Enucleation  2003; 2006    bilateral; "diabetes; pain"  . Fracture surgery    . Cholecystectomy open  1992  . Insertion prostate radiation seed  03/2012  . Cystoscopy with retrograde pyelogram, ureteroscopy and stent placement Left 07/28/2014    Procedure: CYSTOSCOPY WITH RETROGRADE PYELOGRAM, URETERAL BALLOON DILITATION, URETEROSCOPY AND LEFT STENT PLACEMENT;  Surgeon: [name], MD;  Location: [facility];  Service: Urology;  Laterality: Left;  . Ligation of  competing branches of arteriovenous fistula Left 07/29/2014    Procedure: LIGATION OF COMPETING BRANCHES OF LEFT ARM ARTERIOVENOUS FISTULA;  Surgeon: [name], MD;  Location: [facility];  Service: Vascular;  Laterality: Left;  . Nephrectomy Right 01/30/2015    done at [facility] History  Problem Relation Age of Onset  . Cancer    . Coronary artery disease    . Kidney disease    . Breast cancer    . Ovarian cancer    . Lung cancer    . Diabetes    . Cancer Mother 42    breast, spine and ovarian  . Heart disease Mother   . Hyperlipidemia Mother   . Hypertension Mother   . Varicose Veins Mother   . Emphysema Father   . Cancer Father 64    kidney, prostate  . Heart disease Father   . Hyperlipidemia Father   . Hypertension Father   . Cancer Sister     ovarian  . Cancer Brother     lung  . Heart disease Brother   . Hyperlipidemia Brother   . Hypertension Brother   . Heart attack Brother   . Peripheral vascular disease Brother   . Malignant hyperthermia Neg Hx    Social History  Substance Use Topics  . Smoking status: Never Smoker   . Smokeless tobacco: Never Used  . Alcohol Use: No    Review of Systems  All other systems reviewed and are negative.     Allergies  Codeine and Tape  Home Medications   Prior to Admission medications   Medication Sig Start Date End Date Taking? Authorizing Provider  aspirin 81 MG tablet Take 81 mg by mouth daily.    Historical Provider, MD  atorvastatin (LIPITOR) 10 MG tablet TAKE 1 TABLET BY MOUTH EVERY DAY Patient taking differently: Take 1 tablet by mouth every day. 05/13/14   Thayer Headings, MD  b complex-vitamin c-folic acid (NEPHRO-VITE) 0.8 MG TABS Take 0.8 mg by mouth at bedtime.    Historical Provider, MD  calcitRIOL (ROCALTROL) 0.25 MCG capsule Take 0.25 mcg by mouth 3 (three) times a week. Monday, Wednesday, and Friday.    Historical Provider, MD  heparin 5000 UNIT/ML injection Inject 8,000 Units into the skin  3 (three) times a week. Monday, Wednesday, and Friday.    Historical Provider, MD  Methoxy PEG-Epoetin Beta (MIRCERA) 50 MCG/0.3ML SOSY Inject 225 mcg as directed every 14 (fourteen) days.     Historical Provider, MD  midodrine (PROAMATINE) 10 MG tablet Take 10 mg by mouth 2 (two) times daily.    Historical Provider, MD  multivitamin (RENA-VIT) TABS tablet Take 1 tablet by mouth at bedtime. 02/15/15   Orson Eva, MD  Nutritional Supplements (FEEDING SUPPLEMENT, NEPRO CARB STEADY,) LIQD Take 237 mLs by mouth 2 (  two) times daily between meals. Patient not taking: Reported on 05/11/2015 02/15/15   Orson Eva, MD  sevelamer carbonate (RENVELA) 800 MG tablet Take 4,000 mg by mouth 3 (three) times daily with meals.  06/26/14   Historical Provider, MD   BP 88/56 mmHg  Pulse 69  Temp(Src) 98 F (36.7 C) (Oral)  Resp 16  Ht 6\' 4"  (1.93 m)  Wt 97.523 kg  BMI 26.18 kg/m2  SpO2 100% Physical Exam  Constitutional: He is oriented to person, place, and time. He appears well-developed and well-nourished.  Non-toxic appearance. No distress.  HENT:  Head: Normocephalic and atraumatic.  Eyes: Conjunctivae, EOM and lids are normal. Pupils are equal, round, and reactive to light.  Neck: Normal range of motion. Neck supple. No tracheal deviation present. No thyroid mass present.  Cardiovascular: Normal rate, regular rhythm and normal heart sounds.  Exam reveals no gallop.   No murmur heard. Pulmonary/Chest: Effort normal and breath sounds normal. No stridor. No respiratory distress. He has no decreased breath sounds. He has no wheezes. He has no rhonchi. He has no rales.  Abdominal: Soft. Normal appearance and bowel sounds are normal. He exhibits no distension. There is no tenderness. There is no rigidity, no rebound, no guarding and no CVA tenderness.    Musculoskeletal: Normal range of motion. He exhibits no edema or tenderness.  Neurological: He is alert and oriented to person, place, and time. He has normal  strength. No cranial nerve deficit or sensory deficit. GCS eye subscore is 4. GCS verbal subscore is 5. GCS motor subscore is 6.  Skin: Skin is warm and dry. No abrasion and no rash noted.  Psychiatric: He has a normal mood and affect. His speech is normal and behavior is normal.  Nursing note and vitals reviewed.   ED Course  Procedures (including critical care time) Labs Review Labs Reviewed - No data to display  Imaging Review No results found. I have personally reviewed and evaluated these images and lab results as part of my medical decision-making.   EKG Interpretation None      MDM   Final diagnoses:  None    Patients repeat  blood pressure is stable. He has no signs of dizziness or chest pain. His tubing was placed to his JP drain and is draining fluid appropriately. Will follow with his Dr. as needed    Lacretia Leigh, MD 05/25/15 [phone]

## 2015-06-09 ENCOUNTER — Ambulatory Visit (INDEPENDENT_AMBULATORY_CARE_PROVIDER_SITE_OTHER): Payer: Medicare Other | Admitting: Sports Medicine

## 2015-06-09 ENCOUNTER — Encounter: Payer: Self-pay | Admitting: Sports Medicine

## 2015-06-09 DIAGNOSIS — M79673 Pain in unspecified foot: Secondary | ICD-10-CM | POA: Diagnosis not present

## 2015-06-09 DIAGNOSIS — E1142 Type 2 diabetes mellitus with diabetic polyneuropathy: Secondary | ICD-10-CM | POA: Diagnosis not present

## 2015-06-09 DIAGNOSIS — Z89431 Acquired absence of right foot: Secondary | ICD-10-CM

## 2015-06-09 DIAGNOSIS — T07XXXA Unspecified multiple injuries, initial encounter: Secondary | ICD-10-CM

## 2015-06-09 DIAGNOSIS — B351 Tinea unguium: Secondary | ICD-10-CM | POA: Diagnosis not present

## 2015-06-09 DIAGNOSIS — T148 Other injury of unspecified body region: Secondary | ICD-10-CM

## 2015-06-09 DIAGNOSIS — IMO0002 Reserved for concepts with insufficient information to code with codable children: Secondary | ICD-10-CM

## 2015-06-09 NOTE — Progress Notes (Signed)
Patient ID: [name], male   DOB: [birth year], 65 y.o.   MRN: [id number] Subjective: [name] is a 65 y.o. male patient with history of type 2 diabetes who presents to office today complaining of long, painful nails on left while ambulating in shoes; unable to trim. Patient is assisted by wife who states that her husband lost his right foot 15 years ago after routine nail care; Recommend that his left toes are lightly trimmed at this visit. Patient also got scrapes to the toes 1 month ago after dropping razor on foot; wife has been applying neosporin to areas with improvement. Patient denies any new changes in medication or new problems. Patient denies any new cramping, numbness, burning or tingling in the legs.  Patient Active Problem List   Diagnosis Date Noted  . Intra-abdominal infection 05/11/2015  . Chronic anemia 05/11/2015  . Second degree AV block, Mobitz type I 02/15/2015  . Hypotension 02/14/2015  . Hypoglycemia 02/13/2015  . DM type 2, goal A1c below 7 02/13/2015  . Blind   . DM type 2 (diabetes mellitus, type 2) ([place])   . Clear cell renal cell carcinoma (HCC)   . Renal carcinoma ([place])   . Non-intractable cyclical vomiting with nausea   . Hydronephrosis 07/28/2014  . Coccyx pain 01/03/2014  . Thrombocytopenia ([place]) 01/03/2014  . Intertrochanteric fracture of left hip ([place]) 12/30/2013  . Dialysis patient ([place]) 12/30/2013  . Hip fracture ([place]) 12/30/2013  . Hypotension of hemodialysis 06/19/2013  . Peripheral vascular disease ([place]) 05/27/2009  . COLONIC POLYPS 02/12/2009  . ANEMIA 01/27/2009  . GLAUCOMA 01/27/2009  . Essential hypertension 01/27/2009  . ESRD (end stage renal disease) on dialysis ([place]) 01/27/2009  . DIAB W/UNS COMP TYPE II/UNS NOT STATED UNCNTRL 01/08/2008  . HYPERLIPIDEMIA-MIXED 01/08/2008  . OVERWEIGHT/OBESITY 01/08/2008  . DEPRESSIVE DISORDER NOT ELSEWHERE CLASSIFIED 01/08/2008  . CAD, s/p CABG 2013 01/08/2008  . EDEMA 01/08/2008   Current  Outpatient Prescriptions on File Prior to Visit  Medication Sig Dispense Refill  . aspirin 81 MG tablet Take 81 mg by mouth daily.    [name] atorvastatin (LIPITOR) 10 MG tablet TAKE 1 TABLET BY MOUTH EVERY DAY (Patient taking differently: Take 1 tablet by mouth every day.) 30 tablet 6  . b complex-vitamin c-folic acid (NEPHRO-VITE) 0.8 MG TABS Take 0.8 mg by mouth at bedtime.    . calcitRIOL (ROCALTROL) 0.25 MCG capsule Take 0.25 mcg by mouth 3 (three) times a week. Monday, Wednesday, and Friday.    . heparin 5000 UNIT/ML injection Inject 8,000 Units into the skin 3 (three) times a week. Monday, Wednesday, and Friday.    . Methoxy PEG-Epoetin Beta (MIRCERA) 50 MCG/0.3ML SOSY Inject 225 mcg as directed every 14 (fourteen) days.     . midodrine (PROAMATINE) 10 MG tablet Take 10 mg by mouth 2 (two) times daily.    . multivitamin (RENA-VIT) TABS tablet Take 1 tablet by mouth at bedtime. 30 tablet 1  . Nutritional Supplements (FEEDING SUPPLEMENT, NEPRO CARB STEADY,) LIQD Take 237 mLs by mouth 2 (two) times daily between meals. 60 Can 0  . sevelamer carbonate (RENVELA) 800 MG tablet Take 4,000 mg by mouth 3 (three) times daily with meals.      Current Facility-Administered Medications on File Prior to Visit  Medication Dose Route Frequency Provider Last Rate Last Dose  . Chlorhexidine Gluconate Cloth 2 % PADS 6 each  6 each Topical Once [name], MD       Allergies  Allergen Reactions  . Codeine Other (See Comments)    Makes patient incoherent. STATES MAKES HIM COMATOSE  . Tape Other (See Comments)    Plastic tape tears skin off, please use paper tape instead.    Recent Results (from the past 2160 hour(s))  Lipase, blood     Status: None   Collection Time: 05/11/15  4:20 PM  Result Value Ref Range   Lipase 23 11 - 51 U/L  Comprehensive metabolic panel     Status: Abnormal   Collection Time: 05/11/15  4:20 PM  Result Value Ref Range   Sodium 139 135 - 145 mmol/L   Potassium 3.0 (L) 3.5  - 5.1 mmol/L   Chloride 95 (L) 101 - 111 mmol/L   CO2 32 22 - 32 mmol/L   Glucose, Bld 219 (H) 65 - 99 mg/dL   BUN 39 (H) 6 - 20 mg/dL   Creatinine, Ser 4.51 (H) 0.61 - 1.24 mg/dL   Calcium 8.8 (L) 8.9 - 10.3 mg/dL   Total Protein 6.1 (L) 6.5 - 8.1 g/dL   Albumin 2.7 (L) 3.5 - 5.0 g/dL   AST 13 (L) 15 - 41 U/L   ALT 10 (L) 17 - 63 U/L   Alkaline Phosphatase 92 38 - 126 U/L   Total Bilirubin 0.6 0.3 - 1.2 mg/dL   GFR calc non Af Amer 12 (L) >60 mL/min   GFR calc Af Amer 14 (L) >60 mL/min    Comment: (NOTE) The eGFR has been calculated using the CKD EPI equation. This calculation has not been validated in all clinical situations. eGFR's persistently <60 mL/min signify possible Chronic Kidney Disease.    Anion gap 12 5 - 15  CBC     Status: Abnormal   Collection Time: 05/11/15  4:20 PM  Result Value Ref Range   WBC 9.4 4.0 - 10.5 K/uL   RBC 2.81 (L) 4.22 - 5.81 MIL/uL   Hemoglobin 8.2 (L) 13.0 - 17.0 g/dL   HCT 26.7 (L) 39.0 - 52.0 %   MCV 95.0 78.0 - 100.0 fL   MCH 29.2 26.0 - 34.0 pg   MCHC 30.7 30.0 - 36.0 g/dL   RDW 15.4 11.5 - 15.5 %   Platelets 148 (L) 150 - 400 K/uL  Urinalysis, Routine w reflex microscopic (not at [facility])     Status: Abnormal   Collection Time: 05/11/15  6:28 PM  Result Value Ref Range   Color, Urine YELLOW YELLOW   APPearance TURBID (A) CLEAR   Specific Gravity, Urine 1.014 1.005 - 1.030   pH 7.5 5.0 - 8.0   Glucose, UA NEGATIVE NEGATIVE mg/dL   Hgb urine dipstick MODERATE (A) NEGATIVE   Bilirubin Urine NEGATIVE NEGATIVE   Ketones, ur NEGATIVE NEGATIVE mg/dL   Protein, ur >300 (A) NEGATIVE mg/dL   Nitrite NEGATIVE NEGATIVE   Leukocytes, UA LARGE (A) NEGATIVE  Urine microscopic-add on     Status: Abnormal   Collection Time: 05/11/15  6:28 PM  Result Value Ref Range   Squamous Epithelial / LPF 6-30 (A) NONE SEEN   WBC, UA TOO NUMEROUS TO COUNT 0 - 5 WBC/hpf   RBC / HPF TOO NUMEROUS TO COUNT 0 - 5 RBC/hpf   Bacteria, UA MANY (A) NONE SEEN   Glucose, capillary     Status: Abnormal   Collection Time: 05/11/15 10:49 PM  Result Value Ref Range   Glucose-Capillary 142 (H) 65 - 99 mg/dL  Glucose, capillary     Status: Abnormal  Collection Time: 05/12/15  1:26 AM  Result Value Ref Range   Glucose-Capillary 106 (H) 65 - 99 mg/dL  Glucose, capillary     Status: Abnormal   Collection Time: 05/12/15  4:59 AM  Result Value Ref Range   Glucose-Capillary 109 (H) 65 - 99 mg/dL   Comment 1 Notify RN    Comment 2 Document in Chart   Comprehensive metabolic panel     Status: Abnormal   Collection Time: 05/12/15  6:32 AM  Result Value Ref Range   Sodium 139 135 - 145 mmol/L   Potassium 3.4 (L) 3.5 - 5.1 mmol/L   Chloride 94 (L) 101 - 111 mmol/L   CO2 31 22 - 32 mmol/L   Glucose, Bld 110 (H) 65 - 99 mg/dL   BUN 49 (H) 6 - 20 mg/dL   Creatinine, Ser 5.79 (H) 0.61 - 1.24 mg/dL   Calcium 9.4 8.9 - 10.3 mg/dL   Total Protein 5.8 (L) 6.5 - 8.1 g/dL   Albumin 2.3 (L) 3.5 - 5.0 g/dL   AST 11 (L) 15 - 41 U/L   ALT 12 (L) 17 - 63 U/L   Alkaline Phosphatase 91 38 - 126 U/L   Total Bilirubin 0.8 0.3 - 1.2 mg/dL   GFR calc non Af Amer 9 (L) >60 mL/min   GFR calc Af Amer 11 (L) >60 mL/min    Comment: (NOTE) The eGFR has been calculated using the CKD EPI equation. This calculation has not been validated in all clinical situations. eGFR's persistently <60 mL/min signify possible Chronic Kidney Disease.    Anion gap 14 5 - 15  CBC WITH DIFFERENTIAL     Status: Abnormal   Collection Time: 05/12/15  6:32 AM  Result Value Ref Range   WBC 6.6 4.0 - 10.5 K/uL   RBC 2.71 (L) 4.22 - 5.81 MIL/uL   Hemoglobin 8.1 (L) 13.0 - 17.0 g/dL   HCT 26.3 (L) 39.0 - 52.0 %   MCV 97.0 78.0 - 100.0 fL   MCH 29.9 26.0 - 34.0 pg   MCHC 30.8 30.0 - 36.0 g/dL   RDW 15.8 (H) 11.5 - 15.5 %   Platelets 121 (L) 150 - 400 K/uL   Neutrophils Relative % 88 %   Neutro Abs 5.8 1.7 - 7.7 K/uL   Lymphocytes Relative 5 %   Lymphs Abs 0.4 (L) 0.7 - 4.0 K/uL    Monocytes Relative 6 %   Monocytes Absolute 0.4 0.1 - 1.0 K/uL   Eosinophils Relative 1 %   Eosinophils Absolute 0.1 0.0 - 0.7 K/uL   Basophils Relative 0 %   Basophils Absolute 0.0 0.0 - 0.1 K/uL  Glucose, capillary     Status: Abnormal   Collection Time: 05/12/15  8:02 AM  Result Value Ref Range   Glucose-Capillary 112 (H) 65 - 99 mg/dL   Comment 1 Notify RN    Comment 2 Document in Chart   Glucose, capillary     Status: None   Collection Time: 05/12/15 11:15 AM  Result Value Ref Range   Glucose-Capillary 96 65 - 99 mg/dL   Comment 1 Notify RN    Comment 2 Document in Chart   Glucose, capillary     Status: None   Collection Time: 05/12/15  5:09 PM  Result Value Ref Range   Glucose-Capillary 78 65 - 99 mg/dL   Comment 1 Notify RN    Comment 2 Document in Chart     Objective: General: Patient is awake,  alert, and oriented x 3 and in no acute distress. Patient is blind (both eyes removed).   Integument: Skin is warm and supple bilateral. Nails are tender, long, thickened and  dystrophic with subungual debris, consistent with onychomycosis, 1-5 on left. Superfical abrasions to left hallux, 2nd and 3rd toes distal aspects with No signs of infection. No preulcerative lesions present bilateral. Remaining integument unremarkable.  Vasculature:  Dorsalis Pedis pulse 1/4 bilateral. Posterior Tibial pulse  1/4 bilateral faint, Capillary fill time <5 sec 1-5 left. No hair growth to the level of the digits. Temperature gradient decreased. No varicosities present bilateral. No edema present bilateral.   Neurology: The patient has absent sensation measured with a 5.07/10g Semmes Weinstein Monofilament at all pedal sites on left . Vibratory sensation diminished bilateral ankles with tuning fork.   Musculoskeletal: Right TMA. Left mild lesser hammertoes. Muscular strength within normal limits for patient status. No tenderness with calf compression bilateral.  Assessment and Plan: Problem  List Items Addressed This Visit    None    Visit Diagnoses    Dermatophytosis of nail    -  Primary    Multiple abrasions        Left 1-3 toes with no signs of infection    Foot amputation status, right (HCC)        TMA, 15 years ago    Diabetic polyneuropathy associated with type 2 diabetes mellitus (HCC)        Foot pain, unspecified laterality          -Examined patient. -Discussed and educated patient on diabetic foot care, especially with  regards to the vascular, neurological and musculoskeletal systems.  -Stressed the importance of good glycemic control and the detriment of not  controlling glucose levels in relation to the foot. -Mechanically debrided all nails 1-5 on left using sterile nail nipper and filed with dremel without incident  -Advised wife to cont with neosporin to abrasions sites on left toes and to monitor closely for signs of infection -Rx Custom molded shoes and custom inserts; [employer] -Answered all patient questions -Patient to return in 3 months for at risk foot care -Patient advised to call the office if any problems or questions arise in the meantime.  Landis Martins, DPM

## 2015-06-16 DIAGNOSIS — H543 Unqualified visual loss, both eyes: Secondary | ICD-10-CM | POA: Insufficient documentation

## 2015-06-16 DIAGNOSIS — E119 Type 2 diabetes mellitus without complications: Secondary | ICD-10-CM | POA: Insufficient documentation

## 2015-06-16 DIAGNOSIS — C649 Malignant neoplasm of unspecified kidney, except renal pelvis: Secondary | ICD-10-CM | POA: Insufficient documentation

## 2015-08-24 ENCOUNTER — Encounter: Payer: Self-pay | Admitting: Vascular Surgery

## 2015-08-26 ENCOUNTER — Encounter: Payer: Self-pay | Admitting: Vascular Surgery

## 2015-08-26 ENCOUNTER — Other Ambulatory Visit: Payer: Self-pay

## 2015-08-26 ENCOUNTER — Ambulatory Visit (INDEPENDENT_AMBULATORY_CARE_PROVIDER_SITE_OTHER): Payer: Medicare Other | Admitting: Vascular Surgery

## 2015-08-26 VITALS — BP 86/54 | HR 65 | Ht 76.0 in | Wt 199.0 lb

## 2015-08-26 DIAGNOSIS — N186 End stage renal disease: Secondary | ICD-10-CM

## 2015-08-26 NOTE — Progress Notes (Signed)
[facility] [place]  Patient name: [name] MRN: [id number] DOB: [birth year] Sex: male  REASON FOR VISIT: To evaluate an arterial anastomotic stenosis which is not amenable to endovascular therapy. Referred by Dr. Posey Pronto.   HPI: [name] is a 65 y.o. male who dialyzes on Monday Wednesdays and Fridays. He has a left radiocephalic AV fistula. In April 2016 Dr. Oneida Alar performed a side branch ligation. As recently, on 08/20/2015, he had a fistulogram by [facility]. The patient was found to have a 60-70% arterial anastomotic stenosis that could not be treated with an endovascular approach as the lesion could not be crossed.  The patient states that the flows have been lower on dialysis because of the stenosis. Of note has had a recent intra-abdominal infection is being treated with intravenous antibiotics and hasn't drained. He is on Flagyl and also receives intravenous antibiotics at the time of dialysis. This occurred after a nephrectomy in October 2016.  Past Medical History  Diagnosis Date  . CAD, NATIVE VESSEL 01/08/2008       . HYPERLIPIDEMIA-MIXED 01/08/2008  . OVERWEIGHT/OBESITY 01/08/2008    Lost 205 lbs through diet and exercise.    . Anemia   . Bruises easily   . Blind     both eyes removed   . Family history of breast cancer     mother  . Cellulitis late 1980's    "hospitalized; wrapped both legs; several times; no OR for this"  . Peripheral vascular disease ([place])   . Colon polyps   . HYPERTENSION 01/27/2009    BP low  . Hypotension   . Pneumonia 2000;s X 1  . DM type 2 (diabetes mellitus, type 2) ([place])     no medications (07/28/2014)  . Arthritis     "back, neck" (07/28/2014)  . ESRD (end stage renal disease) on dialysis [facility])     [name]; Mon, Wed, Fri (07/28/2014)  . Renal insufficiency   . Prostate cancer ([place])   . Renal cell carcinoma 2001 and 2003    "both kidneys"    Family History  Problem Relation Age of Onset  .  Cancer    . Coronary artery disease    . Kidney disease    . Breast cancer    . Ovarian cancer    . Lung cancer    . Diabetes    . Cancer Mother 82    breast, spine and ovarian  . Heart disease Mother   . Hyperlipidemia Mother   . Hypertension Mother   . Varicose Veins Mother   . Emphysema Father   . Cancer Father 46    kidney, prostate  . Heart disease Father   . Hyperlipidemia Father   . Hypertension Father   . Cancer Sister     ovarian  . Cancer Brother     lung  . Heart disease Brother   . Hyperlipidemia Brother   . Hypertension Brother   . Heart attack Brother   . Peripheral vascular disease Brother   . Malignant hyperthermia Neg Hx     SOCIAL HISTORY: Social History  Substance Use Topics  . Smoking status: Never Smoker   . Smokeless tobacco: Never Used  . Alcohol Use: No    Allergies  Allergen Reactions  . Codeine Other (See Comments)    Makes patient incoherent. STATES MAKES HIM COMATOSE  . Tape Other (See Comments)    Plastic tape tears skin off, please use paper tape  instead.    Current Outpatient Prescriptions  Medication Sig Dispense Refill  . aspirin 81 MG tablet Take 81 mg by mouth daily.    [name] atorvastatin (LIPITOR) 10 MG tablet TAKE 1 TABLET BY MOUTH EVERY DAY (Patient taking differently: Take 1 tablet by mouth every day.) 30 tablet 6  . b complex-vitamin c-folic acid (NEPHRO-VITE) 0.8 MG TABS Take 0.8 mg by mouth at bedtime.    . cefTAZidime (FORTAZ) 1 g injection Inject 1,000 mg into the vein.    . heparin 5000 UNIT/ML injection Inject 8,000 Units into the skin 3 (three) times a week. Monday, Wednesday, and Friday.    . Methoxy PEG-Epoetin Beta (MIRCERA) 50 MCG/0.3ML SOSY Inject 225 mcg as directed every 14 (fourteen) days.     . metroNIDAZOLE (FLAGYL) 500 MG tablet Take 500 mg by mouth.    . midodrine (PROAMATINE) 10 MG tablet Take 10 mg by mouth 2 (two) times daily.    . sevelamer carbonate (RENVELA) 800 MG tablet Take 4,000 mg by mouth 3  (three) times daily with meals.     . Vancomycin HCl in NaCl 1-0.9 GM/250ML-% SOLN Inject 1,250 mg into the vein.    . calcitRIOL (ROCALTROL) 0.25 MCG capsule Take 0.25 mcg by mouth 3 (three) times a week. Reported on 08/26/2015    . multivitamin (RENA-VIT) TABS tablet Take 1 tablet by mouth at bedtime. (Patient not taking: Reported on 08/26/2015) 30 tablet 1  . Nutritional Supplements (FEEDING SUPPLEMENT, NEPRO CARB STEADY,) LIQD Take 237 mLs by mouth 2 (two) times daily between meals. (Patient not taking: Reported on 08/26/2015) 60 Can 0   No current facility-administered medications for this visit.   Facility-Administered Medications Ordered in Other Visits  Medication Dose Route Frequency Provider Last Rate Last Dose  . Chlorhexidine Gluconate Cloth 2 % PADS 6 each  6 each Topical Once [name], MD        REVIEW OF SYSTEMS:  [X]  denotes positive finding, [ ]  denotes negative finding Cardiac  Comments:  Chest pain or chest pressure:    Shortness of breath upon exertion:    Short of breath when lying flat:    Irregular heart rhythm:        Vascular    Pain in calf, thigh, or hip brought on by ambulation:    Pain in feet at night that wakes you up from your sleep:     Blood clot in your veins:    Leg swelling:         Pulmonary    Oxygen at home:    Productive cough:     Wheezing:         Neurologic    Sudden weakness in arms or legs:     Sudden numbness in arms or legs:     Sudden onset of difficulty speaking or slurred speech:    Temporary loss of vision in one eye:     Problems with dizziness:         Gastrointestinal    Blood in stool:     Vomited blood:         Genitourinary    Burning when urinating:     Blood in urine:        Psychiatric    Major depression:         Hematologic    Bleeding problems:    Problems with blood clotting too easily:        Skin    Rashes or ulcers:  Constitutional    Fever or chills:      PHYSICAL EXAM: Filed  Vitals:   08/26/15 1142  BP: 86/54  Pulse: 65  Height: 6\' 4"  (1.93 m)  Weight: 199 lb (90.266 kg)  SpO2: 100%    GENERAL: The patient is a well-nourished male, in no acute distress. The vital signs are documented above. CARDIAC: There is a regular rate and rhythm.  VASCULAR: his fistula in the left forearm has a thrill although it is slightly weak. Just had dialysis 30 minutes ago and therefore did not remove his dressings. PULMONARY: There is good air exchange bilaterally without wheezing or rales. ABDOMEN: He has a drain in the right lower quadrant.  DATA:  I reviewed his fistulogram. He appears to have a tight stenosis of the anastomosis. The artery at this levels fairly small but the artery proximal to this looks larger.  MEDICAL ISSUES:  ANASTOMOTIC STENOSIS LEFT RADIOCEPHALIC AV FISTULA: This patient has a poorly functioning fistula related to an anastomotic stenosis. Given the risk of thrombosis and poor flows in the fistula would recommend revision. This could potentially be done by plugging the fistula in higher up on the radial artery where the artery appears to be larger. The patient dialyzes on Monday Wednesdays and Fridays and we'll arrange this on a Tuesday or Thursday. Given the risk of graft thrombosis will try to get this done next week. Although he is being treated for an intra-abdominal infection given that this is been going on for some time I do not think we can wait for the infection is cleared before considering surgery given the risk of graft thrombosis.  [name] [facility] [place] [phone]

## 2015-08-27 ENCOUNTER — Encounter: Payer: Self-pay | Admitting: Nephrology

## 2015-08-28 ENCOUNTER — Encounter (HOSPITAL_COMMUNITY): Payer: Self-pay | Admitting: *Deleted

## 2015-08-28 NOTE — Progress Notes (Signed)
Spoke with pt's wife, Velva Harman for pre-op call. Pt has CAD with CABG x2. Sees Dr. Percival Spanish. Wife states pt has not had any recent chest pain or sob. Pt is blind, has prosthetic eyes. Pt is diabetic, but has not been on medications for a while. Last A1C was 5.1 per wife. His fasting blood sugar usually runs around 90-120.

## 2015-08-30 MED ORDER — CEFUROXIME SODIUM 1.5 G IJ SOLR
1.5000 g | INTRAMUSCULAR | Status: AC
  Administered 2015-09-01: 1.5 g via INTRAVENOUS
  Filled 2015-08-30: qty 1.5

## 2015-09-01 ENCOUNTER — Ambulatory Visit (HOSPITAL_COMMUNITY): Payer: Medicare Other | Admitting: Anesthesiology

## 2015-09-01 ENCOUNTER — Encounter (HOSPITAL_COMMUNITY): Payer: Self-pay | Admitting: Anesthesiology

## 2015-09-01 ENCOUNTER — Ambulatory Visit (HOSPITAL_COMMUNITY)
Admission: RE | Admit: 2015-09-01 | Discharge: 2015-09-01 | Disposition: A | Discharge status: Home / Self Care | Payer: Medicare Other | Source: Ambulatory Visit | Attending: Vascular Surgery | Admitting: Vascular Surgery

## 2015-09-01 ENCOUNTER — Encounter (HOSPITAL_COMMUNITY)
Admission: RE | Disposition: A | Discharge status: Home / Self Care | Payer: Self-pay | Source: Ambulatory Visit | Attending: Vascular Surgery

## 2015-09-01 ENCOUNTER — Other Ambulatory Visit: Payer: Self-pay | Admitting: *Deleted

## 2015-09-01 DIAGNOSIS — Z833 Family history of diabetes mellitus: Secondary | ICD-10-CM | POA: Insufficient documentation

## 2015-09-01 DIAGNOSIS — Z8042 Family history of malignant neoplasm of prostate: Secondary | ICD-10-CM | POA: Insufficient documentation

## 2015-09-01 DIAGNOSIS — M4692 Unspecified inflammatory spondylopathy, cervical region: Secondary | ICD-10-CM | POA: Insufficient documentation

## 2015-09-01 DIAGNOSIS — I739 Peripheral vascular disease, unspecified: Secondary | ICD-10-CM | POA: Insufficient documentation

## 2015-09-01 DIAGNOSIS — Z91048 Other nonmedicinal substance allergy status: Secondary | ICD-10-CM | POA: Diagnosis not present

## 2015-09-01 DIAGNOSIS — Z801 Family history of malignant neoplasm of trachea, bronchus and lung: Secondary | ICD-10-CM | POA: Diagnosis not present

## 2015-09-01 DIAGNOSIS — E669 Obesity, unspecified: Secondary | ICD-10-CM | POA: Diagnosis not present

## 2015-09-01 DIAGNOSIS — Z9001 Acquired absence of eye: Secondary | ICD-10-CM | POA: Diagnosis not present

## 2015-09-01 DIAGNOSIS — E1122 Type 2 diabetes mellitus with diabetic chronic kidney disease: Secondary | ICD-10-CM | POA: Insufficient documentation

## 2015-09-01 DIAGNOSIS — D649 Anemia, unspecified: Secondary | ICD-10-CM | POA: Diagnosis not present

## 2015-09-01 DIAGNOSIS — Z85528 Personal history of other malignant neoplasm of kidney: Secondary | ICD-10-CM | POA: Insufficient documentation

## 2015-09-01 DIAGNOSIS — T82858A Stenosis of vascular prosthetic devices, implants and grafts, initial encounter: Secondary | ICD-10-CM | POA: Diagnosis present

## 2015-09-01 DIAGNOSIS — E782 Mixed hyperlipidemia: Secondary | ICD-10-CM | POA: Insufficient documentation

## 2015-09-01 DIAGNOSIS — Z86010 Personal history of colonic polyps: Secondary | ICD-10-CM | POA: Insufficient documentation

## 2015-09-01 DIAGNOSIS — Z8041 Family history of malignant neoplasm of ovary: Secondary | ICD-10-CM | POA: Diagnosis not present

## 2015-09-01 DIAGNOSIS — Z885 Allergy status to narcotic agent status: Secondary | ICD-10-CM | POA: Diagnosis not present

## 2015-09-01 DIAGNOSIS — Z79899 Other long term (current) drug therapy: Secondary | ICD-10-CM | POA: Insufficient documentation

## 2015-09-01 DIAGNOSIS — N186 End stage renal disease: Secondary | ICD-10-CM

## 2015-09-01 DIAGNOSIS — H540 Blindness, both eyes: Secondary | ICD-10-CM | POA: Insufficient documentation

## 2015-09-01 DIAGNOSIS — Y939 Activity, unspecified: Secondary | ICD-10-CM | POA: Diagnosis not present

## 2015-09-01 DIAGNOSIS — Z8249 Family history of ischemic heart disease and other diseases of the circulatory system: Secondary | ICD-10-CM | POA: Insufficient documentation

## 2015-09-01 DIAGNOSIS — Z8546 Personal history of malignant neoplasm of prostate: Secondary | ICD-10-CM | POA: Insufficient documentation

## 2015-09-01 DIAGNOSIS — Z992 Dependence on renal dialysis: Secondary | ICD-10-CM | POA: Diagnosis not present

## 2015-09-01 DIAGNOSIS — Z7982 Long term (current) use of aspirin: Secondary | ICD-10-CM | POA: Diagnosis not present

## 2015-09-01 DIAGNOSIS — Z808 Family history of malignant neoplasm of other organs or systems: Secondary | ICD-10-CM | POA: Diagnosis not present

## 2015-09-01 DIAGNOSIS — I2510 Atherosclerotic heart disease of native coronary artery without angina pectoris: Secondary | ICD-10-CM | POA: Diagnosis not present

## 2015-09-01 DIAGNOSIS — T82898A Other specified complication of vascular prosthetic devices, implants and grafts, initial encounter: Secondary | ICD-10-CM | POA: Diagnosis not present

## 2015-09-01 DIAGNOSIS — Z803 Family history of malignant neoplasm of breast: Secondary | ICD-10-CM | POA: Insufficient documentation

## 2015-09-01 DIAGNOSIS — I120 Hypertensive chronic kidney disease with stage 5 chronic kidney disease or end stage renal disease: Secondary | ICD-10-CM | POA: Diagnosis not present

## 2015-09-01 DIAGNOSIS — M4690 Unspecified inflammatory spondylopathy, site unspecified: Secondary | ICD-10-CM | POA: Diagnosis not present

## 2015-09-01 DIAGNOSIS — Z6824 Body mass index (BMI) 24.0-24.9, adult: Secondary | ICD-10-CM | POA: Insufficient documentation

## 2015-09-01 DIAGNOSIS — Z538 Procedure and treatment not carried out for other reasons: Secondary | ICD-10-CM | POA: Insufficient documentation

## 2015-09-01 DIAGNOSIS — Z825 Family history of asthma and other chronic lower respiratory diseases: Secondary | ICD-10-CM | POA: Diagnosis not present

## 2015-09-01 DIAGNOSIS — X58XXXA Exposure to other specified factors, initial encounter: Secondary | ICD-10-CM | POA: Insufficient documentation

## 2015-09-01 HISTORY — DX: Calculus of kidney: N20.0

## 2015-09-01 HISTORY — DX: Hypotension, unspecified: I95.9

## 2015-09-01 HISTORY — DX: Presence of artificial eye: Z97.0

## 2015-09-01 HISTORY — PX: REVISON OF ARTERIOVENOUS FISTULA: SHX6074

## 2015-09-01 LAB — POCT I-STAT 4, (NA,K, GLUC, HGB,HCT)
GLUCOSE: 125 mg/dL — AB (ref 65–99)
HEMATOCRIT: 33 % — AB (ref 39.0–52.0)
Hemoglobin: 11.2 g/dL — ABNORMAL LOW (ref 13.0–17.0)
POTASSIUM: 4.6 mmol/L (ref 3.5–5.1)
SODIUM: 141 mmol/L (ref 135–145)

## 2015-09-01 LAB — GLUCOSE, CAPILLARY: GLUCOSE-CAPILLARY: 127 mg/dL — AB (ref 65–99)

## 2015-09-01 SURGERY — REVISON OF ARTERIOVENOUS FISTULA
Anesthesia: Monitor Anesthesia Care | Site: Arm Lower | Laterality: Left

## 2015-09-01 MED ORDER — PROTAMINE SULFATE 10 MG/ML IV SOLN
INTRAVENOUS | Status: AC
  Filled 2015-09-01: qty 25

## 2015-09-01 MED ORDER — 0.9 % SODIUM CHLORIDE (POUR BTL) OPTIME
TOPICAL | Status: DC | PRN
  Administered 2015-09-01: 1000 mL

## 2015-09-01 MED ORDER — HEPARIN SODIUM (PORCINE) 1000 UNIT/ML IJ SOLN
INTRAMUSCULAR | Status: AC
  Filled 2015-09-01: qty 1

## 2015-09-01 MED ORDER — ACETAMINOPHEN 325 MG PO TABS: 325.0000 mg | ORAL_TABLET | Freq: Four times a day (QID) | ORAL | Status: DC | PRN

## 2015-09-01 MED ORDER — LACTATED RINGERS IV SOLN: INTRAVENOUS | Status: DC

## 2015-09-01 MED ORDER — PROPOFOL 500 MG/50ML IV EMUL
INTRAVENOUS | Status: DC | PRN
  Administered 2015-09-01: 75 ug/kg/min via INTRAVENOUS

## 2015-09-01 MED ORDER — LIDOCAINE HCL (PF) 1 % IJ SOLN
INTRAMUSCULAR | Status: AC
  Filled 2015-09-01: qty 30

## 2015-09-01 MED ORDER — FENTANYL CITRATE (PF) 100 MCG/2ML IJ SOLN: 25.0000 ug | INTRAMUSCULAR | Status: DC | PRN

## 2015-09-01 MED ORDER — CHLORHEXIDINE GLUCONATE CLOTH 2 % EX PADS: 6.0000 | MEDICATED_PAD | Freq: Once | CUTANEOUS | Status: DC

## 2015-09-01 MED ORDER — FENTANYL CITRATE (PF) 250 MCG/5ML IJ SOLN
INTRAMUSCULAR | Status: AC
  Filled 2015-09-01: qty 5

## 2015-09-01 MED ORDER — ONDANSETRON HCL 4 MG/2ML IJ SOLN
INTRAMUSCULAR | Status: AC
  Filled 2015-09-01: qty 2

## 2015-09-01 MED ORDER — LIDOCAINE HCL (CARDIAC) 20 MG/ML IV SOLN
INTRAVENOUS | Status: DC | PRN
  Administered 2015-09-01: 40 mg via INTRATRACHEAL

## 2015-09-01 MED ORDER — ONDANSETRON HCL 4 MG/2ML IJ SOLN
INTRAMUSCULAR | Status: DC | PRN
  Administered 2015-09-01: 4 mg via INTRAVENOUS

## 2015-09-01 MED ORDER — SODIUM CHLORIDE 0.9 % IV SOLN
INTRAVENOUS | Status: DC
  Administered 2015-09-01: 09:00:00 via INTRAVENOUS

## 2015-09-01 MED ORDER — PROPOFOL 10 MG/ML IV BOLUS
INTRAVENOUS | Status: AC
  Filled 2015-09-01: qty 20

## 2015-09-01 MED ORDER — HEMOSTATIC AGENTS (NO CHARGE) OPTIME
TOPICAL | Status: DC | PRN
  Administered 2015-09-01: 1 via TOPICAL

## 2015-09-01 MED ORDER — FENTANYL CITRATE (PF) 100 MCG/2ML IJ SOLN
INTRAMUSCULAR | Status: DC | PRN
  Administered 2015-09-01 (×3): 25 ug via INTRAVENOUS

## 2015-09-01 MED ORDER — MIDAZOLAM HCL 2 MG/2ML IJ SOLN
INTRAMUSCULAR | Status: AC
  Filled 2015-09-01: qty 2

## 2015-09-01 MED ORDER — LIDOCAINE 2% (20 MG/ML) 5 ML SYRINGE
INTRAMUSCULAR | Status: AC
  Filled 2015-09-01: qty 5

## 2015-09-01 MED ORDER — LIDOCAINE HCL (PF) 1 % IJ SOLN
INTRAMUSCULAR | Status: DC | PRN
  Administered 2015-09-01: 30 mL

## 2015-09-01 MED ORDER — SODIUM CHLORIDE 0.9 % IV SOLN
INTRAVENOUS | Status: DC | PRN
  Administered 2015-09-01: 09:00:00

## 2015-09-01 MED ORDER — MEPERIDINE HCL 25 MG/ML IJ SOLN: 6.2500 mg | INTRAMUSCULAR | Status: DC | PRN

## 2015-09-01 MED ORDER — PROPOFOL 10 MG/ML IV BOLUS
INTRAVENOUS | Status: DC | PRN
  Administered 2015-09-01 (×2): 20 mg via INTRAVENOUS

## 2015-09-01 SURGICAL SUPPLY — 37 items
AGENT HMST SPONGE THK3/8 (HEMOSTASIS) ×1
CANISTER SUCTION 2500CC (MISCELLANEOUS) ×3 IMPLANT
CLIP TI MEDIUM 6 (CLIP) ×3 IMPLANT
CLIP TI WIDE RED SMALL 6 (CLIP) ×3 IMPLANT
COVER PROBE W GEL 5X96 (DRAPES) ×2 IMPLANT
DECANTER SPIKE VIAL GLASS SM (MISCELLANEOUS) ×1 IMPLANT
DRAIN PENROSE 1/2X12 LTX STRL (WOUND CARE) IMPLANT
ELECT REM PT RETURN 9FT ADLT (ELECTROSURGICAL) ×3
ELECTRODE REM PT RTRN 9FT ADLT (ELECTROSURGICAL) ×1 IMPLANT
GLOVE BIO SURGEON STRL SZ 6 (GLOVE) ×2 IMPLANT
GLOVE BIO SURGEON STRL SZ 6.5 (GLOVE) ×2 IMPLANT
GLOVE BIO SURGEON STRL SZ7 (GLOVE) ×3 IMPLANT
GLOVE BIO SURGEON STRL SZ8 (GLOVE) ×2 IMPLANT
GLOVE BIO SURGEONS STRL SZ 6.5 (GLOVE) ×2
GLOVE BIOGEL PI IND STRL 7.0 (GLOVE) IMPLANT
GLOVE BIOGEL PI IND STRL 7.5 (GLOVE) ×1 IMPLANT
GLOVE BIOGEL PI INDICATOR 7.0 (GLOVE) ×2
GLOVE BIOGEL PI INDICATOR 7.5 (GLOVE) ×2
GLOVE SURG SS PI 7.0 STRL IVOR (GLOVE) ×2 IMPLANT
GOWN STRL REUS W/ TWL LRG LVL3 (GOWN DISPOSABLE) ×3 IMPLANT
GOWN STRL REUS W/ TWL XL LVL3 (GOWN DISPOSABLE) IMPLANT
GOWN STRL REUS W/TWL LRG LVL3 (GOWN DISPOSABLE) ×9
GOWN STRL REUS W/TWL XL LVL3 (GOWN DISPOSABLE) ×3
HEMOSTAT SPONGE AVITENE ULTRA (HEMOSTASIS) ×2 IMPLANT
KIT BASIN OR (CUSTOM PROCEDURE TRAY) ×3 IMPLANT
KIT ROOM TURNOVER OR (KITS) ×3 IMPLANT
LIQUID BAND (GAUZE/BANDAGES/DRESSINGS) ×3 IMPLANT
NS IRRIG 1000ML POUR BTL (IV SOLUTION) ×3 IMPLANT
PACK CV ACCESS (CUSTOM PROCEDURE TRAY) ×3 IMPLANT
PAD ARMBOARD 7.5X6 YLW CONV (MISCELLANEOUS) ×6 IMPLANT
SUT MNCRL AB 4-0 PS2 18 (SUTURE) ×3 IMPLANT
SUT PROLENE 6 0 BV (SUTURE) ×2 IMPLANT
SUT PROLENE 7 0 BV 1 (SUTURE) ×1 IMPLANT
SUT VIC AB 3-0 SH 27 (SUTURE) ×3
SUT VIC AB 3-0 SH 27X BRD (SUTURE) ×1 IMPLANT
UNDERPAD 30X30 INCONTINENT (UNDERPADS AND DIAPERS) ×3 IMPLANT
WATER STERILE IRR 1000ML POUR (IV SOLUTION) ×3 IMPLANT

## 2015-09-01 NOTE — Transfer of Care (Signed)
Immediate Anesthesia Transfer of Care Note  Patient: [name]  Procedure(s) Performed: Procedure(s): EXPLORATION LEFT LOWER ARM  RADIOCEPHALIC ARTERIOVENOUS FISTULA (Left)  Patient Location: PACU  Anesthesia Type:MAC  Level of Consciousness: awake, oriented and patient cooperative  Airway & Oxygen Therapy: Patient Spontanous Breathing and Patient connected to face mask oxygen  Post-op Assessment: Report given to RN and Post -op Vital signs reviewed and stable  Post vital signs: Reviewed  Last Vitals:  Filed Vitals:   09/01/15 0824  BP: 117/69  Pulse: 62  Temp: 36.7 C  Resp: 20    Last Pain:  Filed Vitals:   09/01/15 0848  PainSc: 0-No pain         Complications: No apparent anesthesia complications

## 2015-09-01 NOTE — Op Note (Signed)
    OPERATIVE NOTE   PROCEDURE: 1. Exploration of left forearm  PRE-OPERATIVE DIAGNOSIS: Anastomotic stenosis in left radiocephalic arteriovenous fistula    POST-OPERATIVE DIAGNOSIS: same as above   SURGEON: Adele Barthel, MD  ASSISTANT(S): Leontine Locket, PAC   ANESTHESIA: local and MAC  ESTIMATED BLOOD LOSS: 50 cc  FINDING(S): 1.  Calcification of radial artery 8cm proximal to the anastomosis: non-compressible 2.  No pulse in radial artery with monophasic flow in artery  SPECIMEN(S):  none  INDICATIONS:   [name] is a 65 y.o. male who presents with anastomotic stenosis in his left radiocephalic arteriovenous fistula.  Dr. Scot Dock felt that an attempt at revision of the anastomosis was indicated based on his recent left arm fistulogram. Risk, benefits, and alternatives to access surgery were discussed.  The patient is aware the risks include but are not limited to: bleeding, infection, steal syndrome, nerve damage, ischemic monomelic neuropathy, failure to mature, need for additional procedures, death and stroke.  The patient agrees to proceed forward with the procedure.  DESCRIPTION: After obtaining full informed written consent, the patient was brought back to the operating room and placed supine upon the operating table.  The patient received IV antibiotics prior to induction.  After obtaining adequate anesthesia, the patient was prepped and draped in the standard fashion for: left arm access procedure.  I injected a total of 15 cc of 1% lidocaine without epinephrine over the left radial artery.  I made a incision 2 cm distal to the anastomosis and 4 cm proximal to anastomosis, overlying the radial artery.  Using electrocautery and blunt dissection, I dissected out the radial artery.  There was no pulse in this artery.  Using a continuous doppler, there was a monophasic signal.  I dissected out the artery proximal to the anastomosis.  The initial 5 cm proximal to the  anastomosis was calcified.  I extended the incision more proximally and dissected out another 3 cm of the radial artery.  This entire segment of the radial artery was calcified.  I could not compress this artery with Debakey forceps, so I felt this artery was not suitable for use as inflow to this fistula.  As this entire segment is the inflow to the fistula, I doubt this fistula is salvageable.  This patient is NOT consent for immediately placement of additional fistula and is NOT consent for tunneled dialysis catheter placement.  I washout this forearm incision and applied Avitene.  After resolution of active bleeding, I reapproximated the subcutaneous tissue with 3-0 Vicryl.  The skin was reapproximated with a running subcuticular of 4-0 Vicryl.  The skin was cleaned, dried, and reinforced with Liquidband.    I will arrange for follow up with Dr. Scot Dock to discuss additional access placement.  COMPLICATIONS: none  CONDITION: stable  Adele Barthel, MD [facility] [place] Office: [phone] Pager: [phone]  09/01/2015, 10:13 AM

## 2015-09-01 NOTE — Anesthesia Preprocedure Evaluation (Signed)
Anesthesia Evaluation  Patient identified by MRN, date of birth, ID band Patient awake    Reviewed: Allergy & Precautions, NPO status , Patient's Chart, lab work & pertinent test results  Airway Mallampati: II  TM Distance: >3 FB Neck ROM: Full    Dental  (+) Teeth Intact, Dental Advisory Given   Pulmonary    breath sounds clear to auscultation       Cardiovascular hypertension, + CAD and + Peripheral Vascular Disease  + dysrhythmias  Rhythm:Regular Rate:Normal     Neuro/Psych PSYCHIATRIC DISORDERS Depression negative neurological ROS     GI/Hepatic negative GI ROS, Neg liver ROS,   Endo/Other  diabetes, Type 2  Renal/GU ESRF and DialysisRenal disease  negative genitourinary   Musculoskeletal  (+) Arthritis ,   Abdominal   Peds negative pediatric ROS (+)  Hematology   Anesthesia Other Findings - Blind (both eyes) - HLD   Reproductive/Obstetrics negative OB ROS                             Anesthesia Physical Anesthesia Plan  ASA: III  Anesthesia Plan: MAC   Post-op Pain Management:    Induction: Intravenous  Airway Management Planned: Natural Airway  Additional Equipment:   Intra-op Plan:   Post-operative Plan:   Informed Consent: I have reviewed the patients History and Physical, chart, labs and discussed the procedure including the risks, benefits and alternatives for the proposed anesthesia with the patient or authorized representative who has indicated his/her understanding and acceptance.     Plan Discussed with: CRNA  Anesthesia Plan Comments:         Anesthesia Quick Evaluation

## 2015-09-01 NOTE — Discharge Instructions (Signed)
° ° °  09/01/2015 [name] [id number] Jun 11, 1950  Surgeon(s): Conrad [place], MD  Procedure(s): EXPLORATION LEFT RADIOCEPHALIC ARTERIOVENOUS FISTULA

## 2015-09-01 NOTE — Anesthesia Procedure Notes (Signed)
Procedure Name: MAC Date/Time: 09/01/2015 9:20 AM Performed by: [place] Pink Pre-anesthesia Checklist: Patient identified, Emergency Drugs available, Suction available, Patient being monitored and Timeout performed Patient Re-evaluated:Patient Re-evaluated prior to inductionOxygen Delivery Method: Simple face mask

## 2015-09-01 NOTE — Interval H&P Note (Signed)
History and Physical Interval Note:  09/01/2015 8:52 AM  [name]  has presented today for surgery, with the diagnosis of End Stage Renal Disease N18.6; Left arm arteriovenous fistula stenosis T82.858A  The various methods of treatment have been discussed with the patient and family. After consideration of risks, benefits and other options for treatment, the patient has consented to  Procedure(s): REVISON OF RADIOCEPHALIC ARTERIOVENOUS FISTULA (Left) as a surgical intervention .  The patient's history has been reviewed, patient examined, no change in status, stable for surgery.  I have reviewed the patient's chart and labs.  Questions were answered to the patient's satisfaction.     [name]

## 2015-09-01 NOTE — H&P (View-Only) (Signed)
[facility] [place]  Patient name: [name] MRN: [id number] DOB: Aug 02, 1950 Sex: male  REASON FOR VISIT: To evaluate an arterial anastomotic stenosis which is not amenable to endovascular therapy. Referred by Dr. Posey Pronto.   HPI: [name] is a 65 y.o. male who dialyzes on Monday Wednesdays and Fridays. He has a left radiocephalic AV fistula. In April 2016 Dr. Oneida Alar performed a side branch ligation. As recently, on 08/20/2015, he had a fistulogram by [facility]. The patient was found to have a 60-70% arterial anastomotic stenosis that could not be treated with an endovascular approach as the lesion could not be crossed.  The patient states that the flows have been lower on dialysis because of the stenosis. Of note has had a recent intra-abdominal infection is being treated with intravenous antibiotics and hasn't drained. He is on Flagyl and also receives intravenous antibiotics at the time of dialysis. This occurred after a nephrectomy in October 2016.  Past Medical History  Diagnosis Date  . CAD, NATIVE VESSEL 01/08/2008       . HYPERLIPIDEMIA-MIXED 01/08/2008  . OVERWEIGHT/OBESITY 01/08/2008    Lost 205 lbs through diet and exercise.    . Anemia   . Bruises easily   . Blind     both eyes removed   . Family history of breast cancer     mother  . Cellulitis late 1980's    "hospitalized; wrapped both legs; several times; no OR for this"  . Peripheral vascular disease ([place])   . Colon polyps   . HYPERTENSION 01/27/2009    BP low  . Hypotension   . Pneumonia 2000;s X 1  . DM type 2 (diabetes mellitus, type 2) ([place])     no medications (07/28/2014)  . Arthritis     "back, neck" (07/28/2014)  . ESRD (end stage renal disease) on dialysis [facility])     [name]; Mon, Wed, Fri (07/28/2014)  . Renal insufficiency   . Prostate cancer ([place])   . Renal cell carcinoma 2001 and 2003    "both kidneys"    Family History  Problem Relation Age of Onset  .  Cancer    . Coronary artery disease    . Kidney disease    . Breast cancer    . Ovarian cancer    . Lung cancer    . Diabetes    . Cancer Mother 87    breast, spine and ovarian  . Heart disease Mother   . Hyperlipidemia Mother   . Hypertension Mother   . Varicose Veins Mother   . Emphysema Father   . Cancer Father 36    kidney, prostate  . Heart disease Father   . Hyperlipidemia Father   . Hypertension Father   . Cancer Sister     ovarian  . Cancer Brother     lung  . Heart disease Brother   . Hyperlipidemia Brother   . Hypertension Brother   . Heart attack Brother   . Peripheral vascular disease Brother   . Malignant hyperthermia Neg Hx     SOCIAL HISTORY: Social History  Substance Use Topics  . Smoking status: Never Smoker   . Smokeless tobacco: Never Used  . Alcohol Use: No    Allergies  Allergen Reactions  . Codeine Other (See Comments)    Makes patient incoherent. STATES MAKES HIM COMATOSE  . Tape Other (See Comments)    Plastic tape tears skin off, please use paper tape  instead.    Current Outpatient Prescriptions  Medication Sig Dispense Refill  . aspirin 81 MG tablet Take 81 mg by mouth daily.    [name] atorvastatin (LIPITOR) 10 MG tablet TAKE 1 TABLET BY MOUTH EVERY DAY (Patient taking differently: Take 1 tablet by mouth every day.) 30 tablet 6  . b complex-vitamin c-folic acid (NEPHRO-VITE) 0.8 MG TABS Take 0.8 mg by mouth at bedtime.    . cefTAZidime (FORTAZ) 1 g injection Inject 1,000 mg into the vein.    . heparin 5000 UNIT/ML injection Inject 8,000 Units into the skin 3 (three) times a week. Monday, Wednesday, and Friday.    . Methoxy PEG-Epoetin Beta (MIRCERA) 50 MCG/0.3ML SOSY Inject 225 mcg as directed every 14 (fourteen) days.     . metroNIDAZOLE (FLAGYL) 500 MG tablet Take 500 mg by mouth.    . midodrine (PROAMATINE) 10 MG tablet Take 10 mg by mouth 2 (two) times daily.    . sevelamer carbonate (RENVELA) 800 MG tablet Take 4,000 mg by mouth 3  (three) times daily with meals.     . Vancomycin HCl in NaCl 1-0.9 GM/250ML-% SOLN Inject 1,250 mg into the vein.    . calcitRIOL (ROCALTROL) 0.25 MCG capsule Take 0.25 mcg by mouth 3 (three) times a week. Reported on 08/26/2015    . multivitamin (RENA-VIT) TABS tablet Take 1 tablet by mouth at bedtime. (Patient not taking: Reported on 08/26/2015) 30 tablet 1  . Nutritional Supplements (FEEDING SUPPLEMENT, NEPRO CARB STEADY,) LIQD Take 237 mLs by mouth 2 (two) times daily between meals. (Patient not taking: Reported on 08/26/2015) 60 Can 0   No current facility-administered medications for this visit.   Facility-Administered Medications Ordered in Other Visits  Medication Dose Route Frequency Provider Last Rate Last Dose  . Chlorhexidine Gluconate Cloth 2 % PADS 6 each  6 each Topical Once [name], MD        REVIEW OF SYSTEMS:  [X]  denotes positive finding, [ ]  denotes negative finding Cardiac  Comments:  Chest pain or chest pressure:    Shortness of breath upon exertion:    Short of breath when lying flat:    Irregular heart rhythm:        Vascular    Pain in calf, thigh, or hip brought on by ambulation:    Pain in feet at night that wakes you up from your sleep:     Blood clot in your veins:    Leg swelling:         Pulmonary    Oxygen at home:    Productive cough:     Wheezing:         Neurologic    Sudden weakness in arms or legs:     Sudden numbness in arms or legs:     Sudden onset of difficulty speaking or slurred speech:    Temporary loss of vision in one eye:     Problems with dizziness:         Gastrointestinal    Blood in stool:     Vomited blood:         Genitourinary    Burning when urinating:     Blood in urine:        Psychiatric    Major depression:         Hematologic    Bleeding problems:    Problems with blood clotting too easily:        Skin    Rashes or ulcers:  Constitutional    Fever or chills:      PHYSICAL EXAM: Filed  Vitals:   08/26/15 1142  BP: 86/54  Pulse: 65  Height: 6\' 4"  (1.93 m)  Weight: 199 lb (90.266 kg)  SpO2: 100%    GENERAL: The patient is a well-nourished male, in no acute distress. The vital signs are documented above. CARDIAC: There is a regular rate and rhythm.  VASCULAR: his fistula in the left forearm has a thrill although it is slightly weak. Just had dialysis 30 minutes ago and therefore did not remove his dressings. PULMONARY: There is good air exchange bilaterally without wheezing or rales. ABDOMEN: He has a drain in the right lower quadrant.  DATA:  I reviewed his fistulogram. He appears to have a tight stenosis of the anastomosis. The artery at this levels fairly small but the artery proximal to this looks larger.  MEDICAL ISSUES:  ANASTOMOTIC STENOSIS LEFT RADIOCEPHALIC AV FISTULA: This patient has a poorly functioning fistula related to an anastomotic stenosis. Given the risk of thrombosis and poor flows in the fistula would recommend revision. This could potentially be done by plugging the fistula in higher up on the radial artery where the artery appears to be larger. The patient dialyzes on Monday Wednesdays and Fridays and we'll arrange this on a Tuesday or Thursday. Given the risk of graft thrombosis will try to get this done next week. Although he is being treated for an intra-abdominal infection given that this is been going on for some time I do not think we can wait for the infection is cleared before considering surgery given the risk of graft thrombosis.  [name] [facility] [place] [phone]

## 2015-09-01 NOTE — Anesthesia Postprocedure Evaluation (Signed)
Anesthesia Post Note  Patient: [name]  Procedure(s) Performed: Procedure(s) (LRB): EXPLORATION LEFT LOWER ARM  RADIOCEPHALIC ARTERIOVENOUS FISTULA (Left)  Patient location during evaluation: PACU Anesthesia Type: General Level of consciousness: awake and alert Pain management: pain level controlled Vital Signs Assessment: post-procedure vital signs reviewed and stable Respiratory status: spontaneous breathing, nonlabored ventilation, respiratory function stable and patient connected to nasal cannula oxygen Cardiovascular status: blood pressure returned to baseline and stable Postop Assessment: no signs of nausea or vomiting Anesthetic complications: no    Last Vitals:  Filed Vitals:   09/01/15 1045 09/01/15 1105  BP: 135/66 122/71  Pulse: 56 62  Temp:    Resp: 13 14    Last Pain:  Filed Vitals:   09/01/15 1107  PainSc: 0-No pain                 Effie Berkshire

## 2015-09-02 ENCOUNTER — Telehealth: Payer: Self-pay | Admitting: Vascular Surgery

## 2015-09-02 NOTE — Telephone Encounter (Signed)
-----   Message from Mena Goes, RN sent at 09/01/2015 11:05 AM EDT ----- Regarding: schedule   ----- Message -----    From: Conrad [place], MD    Sent: 09/01/2015  10:41 AM      To: [facility] Charge Pool  ASIA CONNLEY [id number] [birth year]  Pt needs L arm arterial duplex (Reason: left arm PAD) for his follow up with Dr. Scot Dock

## 2015-09-02 NOTE — Telephone Encounter (Signed)
Sched lab 6/23 at 4 and md at 6/28 at 3:15. Spoke to pt's wife to inform them of appts.

## 2015-09-03 ENCOUNTER — Encounter (HOSPITAL_COMMUNITY): Payer: Self-pay | Admitting: Vascular Surgery

## 2015-09-08 ENCOUNTER — Encounter: Payer: Self-pay | Admitting: Sports Medicine

## 2015-09-08 ENCOUNTER — Ambulatory Visit (INDEPENDENT_AMBULATORY_CARE_PROVIDER_SITE_OTHER): Payer: Medicare Other | Admitting: Sports Medicine

## 2015-09-08 DIAGNOSIS — M79673 Pain in unspecified foot: Secondary | ICD-10-CM | POA: Diagnosis not present

## 2015-09-08 DIAGNOSIS — B351 Tinea unguium: Secondary | ICD-10-CM | POA: Diagnosis not present

## 2015-09-08 DIAGNOSIS — I739 Peripheral vascular disease, unspecified: Secondary | ICD-10-CM | POA: Diagnosis not present

## 2015-09-08 DIAGNOSIS — E1142 Type 2 diabetes mellitus with diabetic polyneuropathy: Secondary | ICD-10-CM | POA: Diagnosis not present

## 2015-09-08 DIAGNOSIS — Z89431 Acquired absence of right foot: Secondary | ICD-10-CM

## 2015-09-08 DIAGNOSIS — IMO0002 Reserved for concepts with insufficient information to code with codable children: Secondary | ICD-10-CM

## 2015-09-08 NOTE — Progress Notes (Signed)
Patient ID: [name], male   DOB: 1950-12-06, 65 y.o.   MRN: [id number] Subjective: [name] is a 65 y.o. male patient with history of type 2 diabetes who presents to office today complaining of long, painful nails on left while ambulating in shoes; unable to trim. Patient denies any new changes in medication or new problems. Patient denies any new cramping, numbness, burning or tingling in the legs.  Patient Active Problem List   Diagnosis Date Noted  . Bilateral blindness 06/16/2015  . Carcinoma of kidney ([place]) 06/16/2015  . Type 2 diabetes mellitus ([place]) 06/16/2015  . Intra-abdominal infection 05/11/2015  . Chronic anemia 05/11/2015  . Infection in abdomen ([place]) 05/11/2015  . Second degree AV block, Mobitz type I 02/15/2015  . Atrioventricular block, Mobitz type 1, Wenckebach 02/15/2015  . Hypotension 02/14/2015  . Hypoglycemia 02/13/2015  . DM type 2, goal A1c below 7 02/13/2015  . Blind   . DM type 2 (diabetes mellitus, type 2) ([place])   . Clear cell renal cell carcinoma (HCC)   . CAD in native artery 09/10/2014  . Renal carcinoma ([place])   . Non-intractable cyclical vomiting with nausea   . Hydronephrosis 07/28/2014  . Coccyx pain 01/03/2014  . Thrombocytopenia ([place]) 01/03/2014  . Intertrochanteric fracture of left hip ([place]) 12/30/2013  . Dialysis patient ([place]) 12/30/2013  . Hip fracture ([place]) 12/30/2013  . Dependence on renal dialysis ([place]) 12/30/2013  . Hypotension of hemodialysis 06/19/2013  . Malignant neoplasm of prostate ([place]) 01/30/2012  . Diabetes mellitus ([place]) 08/30/2011  . Peripheral vascular disease ([place]) 05/27/2009  . COLONIC POLYPS 02/12/2009  . ANEMIA 01/27/2009  . GLAUCOMA 01/27/2009  . Essential hypertension 01/27/2009  . ESRD (end stage renal disease) on dialysis ([employer]) 01/27/2009  . End stage renal failure on dialysis ([place]) 01/27/2009  . DIAB W/UNS COMP TYPE II/UNS NOT STATED UNCNTRL 01/08/2008  . HYPERLIPIDEMIA-MIXED 01/08/2008  .  OVERWEIGHT/OBESITY 01/08/2008  . DEPRESSIVE DISORDER NOT ELSEWHERE CLASSIFIED 01/08/2008  . CAD, s/p CABG 2013 01/08/2008  . EDEMA 01/08/2008  . Clinical depression 01/08/2008  . HLD (hyperlipidemia) 01/08/2008   Current Outpatient Prescriptions on File Prior to Visit  Medication Sig Dispense Refill  . acetaminophen (TYLENOL) 325 MG tablet Take 1-2 tablets (325-650 mg total) by mouth every 6 (six) hours as needed for moderate pain.    [name] aspirin 81 MG tablet Take 81 mg by mouth every evening.     [name] atorvastatin (LIPITOR) 10 MG tablet TAKE 1 TABLET BY MOUTH EVERY DAY (Patient taking differently: Take 1 tablet by mouth every evening) 30 tablet 6  . b complex-vitamin c-folic acid (NEPHRO-VITE) 0.8 MG TABS Take 0.8 mg by mouth at bedtime.    . calcitRIOL (ROCALTROL) 0.25 MCG capsule Take 0.25 mcg by mouth 3 (three) times a week. Reported on 08/26/2015    . heparin 5000 UNIT/ML injection Inject 8,000 Units into the skin 3 (three) times a week. Monday, Wednesday, and Friday.    . Methoxy PEG-Epoetin Beta (MIRCERA) 50 MCG/0.3ML SOSY Inject 225 mcg as directed every 14 (fourteen) days.     . metroNIDAZOLE (FLAGYL) 500 MG tablet Take 500 mg by mouth 3 (three) times daily.    . midodrine (PROAMATINE) 10 MG tablet Take 10 mg by mouth 2 (two) times daily.    . Probiotic Product (ALIGN) 4 MG CAPS Take 1 capsule by mouth daily.    . sevelamer carbonate (RENVELA) 800 MG tablet Take 4,000 mg by mouth 3 (three) times daily with meals.  Current Facility-Administered Medications on File Prior to Visit  Medication Dose Route Frequency Provider Last Rate Last Dose  . Chlorhexidine Gluconate Cloth 2 % PADS 6 each  6 each Topical Once [name], MD       Allergies  Allergen Reactions  . Codeine Other (See Comments)    Makes patient incoherent. STATES MAKES HIM COMATOSE  . Tape Other (See Comments)    Plastic tape tears skin off, please use paper tape instead.    Recent Results (from the past 2160  hour(s))  I-STAT 4, (NA,K, GLUC, HGB,HCT)     Status: Abnormal   Collection Time: 09/01/15  8:30 AM  Result Value Ref Range   Sodium 141 135 - 145 mmol/L   Potassium 4.6 3.5 - 5.1 mmol/L   Glucose, Bld 125 (H) 65 - 99 mg/dL   HCT 33.0 (L) 39.0 - 52.0 %   Hemoglobin 11.2 (L) 13.0 - 17.0 g/dL  Glucose, capillary     Status: Abnormal   Collection Time: 09/01/15 10:31 AM  Result Value Ref Range   Glucose-Capillary 127 (H) 65 - 99 mg/dL   Comment 1 Notify RN     Objective: General: Patient is awake, alert, and oriented x 3 and in no acute distress. Patient is blind (both eyes removed).   Integument: Skin is warm and supple bilateral. Nails are tender, long, thickened and  dystrophic with subungual debris, consistent with onychomycosis, 1-5 on left. Superfical abrasions to left hallux, 2nd and 3rd toes distal aspects healed with No signs of infection. No preulcerative lesions present bilateral. Remaining integument unremarkable.  Vasculature:  Dorsalis Pedis pulse 1/4 bilateral. Posterior Tibial pulse  1/4 bilateral faint, Capillary fill time <5 sec 1-5 left. No hair growth to the level of the digits. Temperature gradient decreased. No varicosities present bilateral. No edema present bilateral.   Neurology: The patient has absent sensation measured with a 5.07/10g Semmes Weinstein Monofilament at all pedal sites on left . Vibratory sensation diminished bilateral ankles with tuning fork.   Musculoskeletal: Right TMA. Left mild lesser hammertoes. Muscular strength within normal limits for patient status. No tenderness with calf compression bilateral.  Assessment and Plan: Problem List Items Addressed This Visit    None    Visit Diagnoses    Dermatophytosis of nail    -  Primary    Diabetic polyneuropathy associated with type 2 diabetes mellitus (HCC)        Foot pain, unspecified laterality        PVD (peripheral vascular disease) ([place])        Foot amputation status, right ([place])           -Examined patient. -Discussed and educated patient on diabetic foot care, especially with  regards to the vascular, neurological and musculoskeletal systems.  -Stressed the importance of good glycemic control and the detriment of not  controlling glucose levels in relation to the foot. -Mechanically debrided all nails 1-5 on left using sterile nail nipper and filed with dremel without incident  -Cont with Custom molded shoes and custom inserts -Answered all patient questions -Patient to return in 3 months for at risk foot care -Patient advised to call the office if any problems or questions arise in the meantime.  Landis Martins, DPM

## 2015-09-23 ENCOUNTER — Encounter: Payer: Self-pay | Admitting: Vascular Surgery

## 2015-09-25 ENCOUNTER — Ambulatory Visit (HOSPITAL_COMMUNITY)
Admission: RE | Admit: 2015-09-25 | Discharge: 2015-09-25 | Disposition: A | Discharge status: Home / Self Care | Payer: Medicare Other | Source: Ambulatory Visit | Attending: Vascular Surgery | Admitting: Vascular Surgery

## 2015-09-25 DIAGNOSIS — I70208 Unspecified atherosclerosis of native arteries of extremities, other extremity: Secondary | ICD-10-CM | POA: Diagnosis not present

## 2015-09-25 DIAGNOSIS — N186 End stage renal disease: Secondary | ICD-10-CM | POA: Diagnosis not present

## 2015-09-25 DIAGNOSIS — I739 Peripheral vascular disease, unspecified: Secondary | ICD-10-CM | POA: Diagnosis present

## 2015-09-30 ENCOUNTER — Encounter: Payer: Self-pay | Admitting: Vascular Surgery

## 2015-09-30 ENCOUNTER — Ambulatory Visit: Payer: Medicare Other | Admitting: Vascular Surgery

## 2015-09-30 ENCOUNTER — Ambulatory Visit (INDEPENDENT_AMBULATORY_CARE_PROVIDER_SITE_OTHER): Payer: Medicare Other | Admitting: Vascular Surgery

## 2015-09-30 VITALS — BP 81/54 | HR 67 | Ht 76.0 in | Wt 199.0 lb

## 2015-09-30 DIAGNOSIS — N186 End stage renal disease: Secondary | ICD-10-CM

## 2015-09-30 DIAGNOSIS — Z992 Dependence on renal dialysis: Secondary | ICD-10-CM

## 2015-09-30 NOTE — Progress Notes (Signed)
POST OPERATIVE OFFICE NOTE    CC:  F/u for surgery  HPI:  This is a 65 y.o. male who is s/p exploration left radial artery and fistula anastomosis.  The anastomosis was diagnosed to be stenotic and causing limited flow in the fistula during HD.  The artery was found to be too calcified to revise the radiocephalic fistula.  Since then he has continued using the fistula and has recent success with a flow rate of 450 per the charge nurse at the HD center he uses.  He is here today for f/u evaluation and consideration of future access sites.  He is currently on a kidney transplant list and will follow up soon to discuss his options.  He does have a donor.    Allergies  Allergen Reactions  . Codeine Other (See Comments)    Makes patient incoherent. STATES MAKES HIM COMATOSE  . Tape Other (See Comments)    Plastic tape tears skin off, please use paper tape instead.    Current Outpatient Prescriptions  Medication Sig Dispense Refill  . acetaminophen (TYLENOL) 325 MG tablet Take 1-2 tablets (325-650 mg total) by mouth every 6 (six) hours as needed for moderate pain.    [name] aspirin 81 MG tablet Take 81 mg by mouth every evening.     [name] atorvastatin (LIPITOR) 10 MG tablet TAKE 1 TABLET BY MOUTH EVERY DAY (Patient taking differently: Take 1 tablet by mouth every evening) 30 tablet 6  . b complex-vitamin c-folic acid (NEPHRO-VITE) 0.8 MG TABS Take 0.8 mg by mouth at bedtime.    . calcitRIOL (ROCALTROL) 0.25 MCG capsule Take 0.25 mcg by mouth 3 (three) times a week. Reported on 08/26/2015    . heparin 5000 UNIT/ML injection Inject 8,000 Units into the skin 3 (three) times a week. Monday, Wednesday, and Friday.    . Methoxy PEG-Epoetin Beta (MIRCERA) 50 MCG/0.3ML SOSY Inject 225 mcg as directed every 14 (fourteen) days.     . metroNIDAZOLE (FLAGYL) 500 MG tablet Take 500 mg by mouth 3 (three) times daily.    . midodrine (PROAMATINE) 10 MG tablet Take 10 mg by mouth 2 (two) times daily.    . Probiotic  Product (ALIGN) 4 MG CAPS Take 1 capsule by mouth daily.    . sevelamer carbonate (RENVELA) 800 MG tablet Take 4,000 mg by mouth 3 (three) times daily with meals.      No current facility-administered medications for this visit.   Facility-Administered Medications Ordered in Other Visits  Medication Dose Route Frequency Provider Last Rate Last Dose  . Chlorhexidine Gluconate Cloth 2 % PADS 6 each  6 each Topical Once [name], MD         ROS:  See HPI  Physical Exam:  Filed Vitals:   09/30/15 1530  BP: 81/54  Pulse: 67    Incision:  Well healed, no distal palpable radial pulse, no muscle waisting in the hand and grip is 5/5 with intact sensation.     Assessment/Plan:  This is a 65 y.o. male who is s/p: Successful HD with current left radiocephalic fistula.  A flow rate of 450 was documented today at the HD center.    Dr. Scot Dock performed an ultrasound and found he has an acceptable left upper arm cephalic vein.   For now he will continue to use the left raiocephalic fistula.  If he has trouble he will need ligation of this and a brachial cephalic fistula creation which may require a temporary HD  catheter.   The patient and his wife are in agreement to this plan.  They will f/u PRN if they have problems.  Theda Sers, EMMA MAUREEN PA-C [facility]   Clinic MD:  Pt seen and examined with Dr. Scot Dock  I have interviewed the patient and examined the patient. I agree with the findings by the PA.  The patient underwent a fistulogram by [facility]. This showed evidence of stenosis at the arterial anastomosis. He was set up for revision. This was perform and Dr. Adele Barthel he noted that the artery was significantly calcified and there were really no options to revise or address the proximal stenosis.  I called the dialysis center today and they said that his flows have been good in the fistula is working well. I explained to them that it if he has problems in  the future as there were really no way to salvage the left forearm fistula, the best option would be conversion to a left brachiocephalic fistula and temporary placement of the catheter. I did look at the upper arm cephalic vein myself in the office and this does appear to be reasonable.  Gae Gallop, MD [phone]

## 2015-11-01 ENCOUNTER — Emergency Department (HOSPITAL_COMMUNITY): Payer: Medicare Other

## 2015-11-01 ENCOUNTER — Encounter (HOSPITAL_COMMUNITY): Payer: Self-pay | Admitting: Emergency Medicine

## 2015-11-01 ENCOUNTER — Inpatient Hospital Stay (HOSPITAL_COMMUNITY)
Admission: EM | Admit: 2015-11-01 | Discharge: 2015-11-05 | DRG: 871 | Disposition: A | Discharge status: Home Health | Payer: Medicare Other | Attending: Internal Medicine | Admitting: Internal Medicine

## 2015-11-01 DIAGNOSIS — H540 Blindness, both eyes: Secondary | ICD-10-CM | POA: Diagnosis not present

## 2015-11-01 DIAGNOSIS — A4150 Gram-negative sepsis, unspecified: Secondary | ICD-10-CM | POA: Diagnosis not present

## 2015-11-01 DIAGNOSIS — D696 Thrombocytopenia, unspecified: Secondary | ICD-10-CM | POA: Diagnosis present

## 2015-11-01 DIAGNOSIS — I120 Hypertensive chronic kidney disease with stage 5 chronic kidney disease or end stage renal disease: Secondary | ICD-10-CM | POA: Diagnosis present

## 2015-11-01 DIAGNOSIS — E1151 Type 2 diabetes mellitus with diabetic peripheral angiopathy without gangrene: Secondary | ICD-10-CM | POA: Diagnosis present

## 2015-11-01 DIAGNOSIS — Z85528 Personal history of other malignant neoplasm of kidney: Secondary | ICD-10-CM

## 2015-11-01 DIAGNOSIS — Z905 Acquired absence of kidney: Secondary | ICD-10-CM

## 2015-11-01 DIAGNOSIS — Z6825 Body mass index (BMI) 25.0-25.9, adult: Secondary | ICD-10-CM

## 2015-11-01 DIAGNOSIS — N186 End stage renal disease: Secondary | ICD-10-CM

## 2015-11-01 DIAGNOSIS — E119 Type 2 diabetes mellitus without complications: Secondary | ICD-10-CM

## 2015-11-01 DIAGNOSIS — R509 Fever, unspecified: Secondary | ICD-10-CM | POA: Diagnosis not present

## 2015-11-01 DIAGNOSIS — N390 Urinary tract infection, site not specified: Secondary | ICD-10-CM | POA: Diagnosis present

## 2015-11-01 DIAGNOSIS — F329 Major depressive disorder, single episode, unspecified: Secondary | ICD-10-CM | POA: Diagnosis present

## 2015-11-01 DIAGNOSIS — K648 Other hemorrhoids: Secondary | ICD-10-CM | POA: Diagnosis present

## 2015-11-01 DIAGNOSIS — A419 Sepsis, unspecified organism: Secondary | ICD-10-CM | POA: Diagnosis present

## 2015-11-01 DIAGNOSIS — Z951 Presence of aortocoronary bypass graft: Secondary | ICD-10-CM | POA: Diagnosis present

## 2015-11-01 DIAGNOSIS — E1122 Type 2 diabetes mellitus with diabetic chronic kidney disease: Secondary | ICD-10-CM | POA: Diagnosis present

## 2015-11-01 DIAGNOSIS — E46 Unspecified protein-calorie malnutrition: Secondary | ICD-10-CM | POA: Diagnosis present

## 2015-11-01 DIAGNOSIS — I959 Hypotension, unspecified: Secondary | ICD-10-CM | POA: Diagnosis present

## 2015-11-01 DIAGNOSIS — Z79899 Other long term (current) drug therapy: Secondary | ICD-10-CM

## 2015-11-01 DIAGNOSIS — C649 Malignant neoplasm of unspecified kidney, except renal pelvis: Secondary | ICD-10-CM | POA: Diagnosis present

## 2015-11-01 DIAGNOSIS — R7881 Bacteremia: Secondary | ICD-10-CM

## 2015-11-01 DIAGNOSIS — H543 Unqualified visual loss, both eyes: Secondary | ICD-10-CM | POA: Diagnosis present

## 2015-11-01 DIAGNOSIS — Z992 Dependence on renal dialysis: Secondary | ICD-10-CM

## 2015-11-01 DIAGNOSIS — E785 Hyperlipidemia, unspecified: Secondary | ICD-10-CM | POA: Diagnosis present

## 2015-11-01 DIAGNOSIS — Z7982 Long term (current) use of aspirin: Secondary | ICD-10-CM

## 2015-11-01 DIAGNOSIS — I2510 Atherosclerotic heart disease of native coronary artery without angina pectoris: Secondary | ICD-10-CM | POA: Diagnosis present

## 2015-11-01 DIAGNOSIS — R935 Abnormal findings on diagnostic imaging of other abdominal regions, including retroperitoneum: Secondary | ICD-10-CM

## 2015-11-01 DIAGNOSIS — R1031 Right lower quadrant pain: Secondary | ICD-10-CM | POA: Diagnosis not present

## 2015-11-01 DIAGNOSIS — D631 Anemia in chronic kidney disease: Secondary | ICD-10-CM | POA: Diagnosis present

## 2015-11-01 DIAGNOSIS — R6510 Systemic inflammatory response syndrome (SIRS) of non-infectious origin without acute organ dysfunction: Secondary | ICD-10-CM

## 2015-11-01 DIAGNOSIS — Z8546 Personal history of malignant neoplasm of prostate: Secondary | ICD-10-CM

## 2015-11-01 DIAGNOSIS — R109 Unspecified abdominal pain: Secondary | ICD-10-CM | POA: Diagnosis present

## 2015-11-01 LAB — URINALYSIS, ROUTINE W REFLEX MICROSCOPIC
BILIRUBIN URINE: NEGATIVE
GLUCOSE, UA: NEGATIVE mg/dL
KETONES UR: NEGATIVE mg/dL
NITRITE: NEGATIVE
PROTEIN: 100 mg/dL — AB
Specific Gravity, Urine: 1.012 (ref 1.005–1.030)
pH: 7 (ref 5.0–8.0)

## 2015-11-01 LAB — GLUCOSE, CAPILLARY: GLUCOSE-CAPILLARY: 156 mg/dL — AB (ref 65–99)

## 2015-11-01 LAB — APTT: aPTT: 31 seconds (ref 24–36)

## 2015-11-01 LAB — BASIC METABOLIC PANEL
ANION GAP: 14 (ref 5–15)
BUN: 86 mg/dL — AB (ref 6–20)
CALCIUM: 9.6 mg/dL (ref 8.9–10.3)
CO2: 27 mmol/L (ref 22–32)
CREATININE: 6.99 mg/dL — AB (ref 0.61–1.24)
Chloride: 103 mmol/L (ref 101–111)
GFR calc Af Amer: 8 mL/min — ABNORMAL LOW (ref >60)
GFR, EST NON AFRICAN AMERICAN: 7 mL/min — AB (ref >60)
GLUCOSE: 131 mg/dL — AB (ref 65–99)
Potassium: 3.6 mmol/L (ref 3.5–5.1)
Sodium: 144 mmol/L (ref 135–145)

## 2015-11-01 LAB — URINE MICROSCOPIC-ADD ON

## 2015-11-01 LAB — LACTIC ACID, PLASMA: Lactic Acid, Venous: 1 mmol/L (ref 0.5–1.9)

## 2015-11-01 LAB — LIPASE, BLOOD: Lipase: 28 U/L (ref 11–51)

## 2015-11-01 LAB — I-STAT CG4 LACTIC ACID, ED
LACTIC ACID, VENOUS: 2.06 mmol/L — AB (ref 0.5–1.9)
LACTIC ACID, VENOUS: 1.05 mmol/L (ref 0.5–1.9)

## 2015-11-01 LAB — PROCALCITONIN: Procalcitonin: 45.38 ng/mL

## 2015-11-01 LAB — PROTIME-INR
INR: 1.12
PROTHROMBIN TIME: 14.5 s (ref 11.4–15.2)

## 2015-11-01 LAB — CBG MONITORING, ED: GLUCOSE-CAPILLARY: 167 mg/dL — AB (ref 65–99)

## 2015-11-01 MED ORDER — SEVELAMER CARBONATE 800 MG PO TABS
4000.0000 mg | ORAL_TABLET | Freq: Three times a day (TID) | ORAL | Status: DC
  Administered 2015-11-04 – 2015-11-05 (×3): 4000 mg via ORAL
  Filled 2015-11-01 (×5): qty 5

## 2015-11-01 MED ORDER — ACETAMINOPHEN 325 MG PO TABS
650.0000 mg | ORAL_TABLET | Freq: Four times a day (QID) | ORAL | Status: DC | PRN
  Filled 2015-11-01: qty 2

## 2015-11-01 MED ORDER — INSULIN ASPART 100 UNIT/ML ~~LOC~~ SOLN
0.0000 [IU] | Freq: Three times a day (TID) | SUBCUTANEOUS | Status: DC
  Administered 2015-11-03 – 2015-11-05 (×2): 1 [IU] via SUBCUTANEOUS
  Administered 2015-11-05: 2 [IU] via SUBCUTANEOUS

## 2015-11-01 MED ORDER — CALCITRIOL 0.25 MCG PO CAPS: 0.2500 ug | ORAL_CAPSULE | ORAL | Status: DC

## 2015-11-01 MED ORDER — ACETAMINOPHEN 650 MG RE SUPP: 650.0000 mg | Freq: Four times a day (QID) | RECTAL | Status: DC | PRN

## 2015-11-01 MED ORDER — PIPERACILLIN-TAZOBACTAM 3.375 G IVPB 30 MIN
3.3750 g | Freq: Once | INTRAVENOUS | Status: AC
  Administered 2015-11-01: 3.375 g via INTRAVENOUS
  Filled 2015-11-01: qty 50

## 2015-11-01 MED ORDER — SODIUM CHLORIDE 0.9% FLUSH
3.0000 mL | Freq: Two times a day (BID) | INTRAVENOUS | Status: DC
Start: 2015-11-01 — End: 2015-11-05
  Administered 2015-11-02 – 2015-11-05 (×5): 3 mL via INTRAVENOUS

## 2015-11-01 MED ORDER — SODIUM CHLORIDE 0.9 % IV BOLUS (SEPSIS)
1000.0000 mL | Freq: Once | INTRAVENOUS | Status: AC
  Administered 2015-11-01: 1000 mL via INTRAVENOUS

## 2015-11-01 MED ORDER — CINACALCET HCL 30 MG PO TABS
30.0000 mg | ORAL_TABLET | Freq: Every day | ORAL | Status: DC
  Administered 2015-11-02 – 2015-11-04 (×3): 30 mg via ORAL
  Filled 2015-11-01 (×2): qty 1

## 2015-11-01 MED ORDER — MIDODRINE HCL 5 MG PO TABS
10.0000 mg | ORAL_TABLET | Freq: Two times a day (BID) | ORAL | Status: DC
  Administered 2015-11-02 – 2015-11-03 (×3): 10 mg via ORAL
  Filled 2015-11-01 (×3): qty 2

## 2015-11-01 MED ORDER — INSULIN ASPART 100 UNIT/ML ~~LOC~~ SOLN: 0.0000 [IU] | Freq: Every day | SUBCUTANEOUS | Status: DC

## 2015-11-01 MED ORDER — SEVELAMER CARBONATE 800 MG PO TABS: 2400.0000 mg | ORAL_TABLET | ORAL | Status: DC | PRN

## 2015-11-01 MED ORDER — ATORVASTATIN CALCIUM 10 MG PO TABS
10.0000 mg | ORAL_TABLET | Freq: Every day | ORAL | Status: DC
  Administered 2015-11-01 – 2015-11-04 (×4): 10 mg via ORAL
  Filled 2015-11-01 (×4): qty 1

## 2015-11-01 MED ORDER — RENA-VITE PO TABS
1.0000 | ORAL_TABLET | Freq: Every day | ORAL | Status: DC
  Administered 2015-11-01 – 2015-11-04 (×4): 1 via ORAL
  Filled 2015-11-01 (×4): qty 1

## 2015-11-01 MED ORDER — PIPERACILLIN-TAZOBACTAM IN DEX 2-0.25 GM/50ML IV SOLN
2.2500 g | Freq: Three times a day (TID) | INTRAVENOUS | Status: DC
  Administered 2015-11-01 – 2015-11-05 (×11): 2.25 g via INTRAVENOUS
  Filled 2015-11-01 (×15): qty 50

## 2015-11-01 MED ORDER — SODIUM CHLORIDE 0.9 % IV SOLN
INTRAVENOUS | Status: DC
  Administered 2015-11-01: 22:00:00 via INTRAVENOUS

## 2015-11-01 MED ORDER — HYDROXYZINE HCL 50 MG/ML IM SOLN
25.0000 mg | Freq: Four times a day (QID) | INTRAMUSCULAR | Status: DC | PRN
  Filled 2015-11-01: qty 0.5

## 2015-11-01 MED ORDER — MORPHINE SULFATE (PF) 2 MG/ML IV SOLN: 2.0000 mg | INTRAVENOUS | Status: DC | PRN

## 2015-11-01 MED ORDER — LOPERAMIDE HCL 2 MG PO CAPS
2.0000 mg | ORAL_CAPSULE | Freq: Four times a day (QID) | ORAL | Status: DC | PRN
Start: 2015-11-01 — End: 2015-11-02

## 2015-11-01 MED ORDER — SEVELAMER CARBONATE 800 MG PO TABS: 2400.0000 mg | ORAL_TABLET | ORAL | Status: DC

## 2015-11-01 MED ORDER — RISAQUAD PO CAPS
1.0000 | ORAL_CAPSULE | Freq: Every day | ORAL | Status: DC
Start: 2015-11-01 — End: 2015-11-05
  Administered 2015-11-01 – 2015-11-05 (×3): 1 via ORAL
  Filled 2015-11-01 (×5): qty 1

## 2015-11-01 MED ORDER — VANCOMYCIN HCL 10 G IV SOLR
1500.0000 mg | Freq: Once | INTRAVENOUS | Status: AC
  Administered 2015-11-01: 1500 mg via INTRAVENOUS
  Filled 2015-11-01: qty 1500

## 2015-11-01 MED ORDER — ASPIRIN EC 81 MG PO TBEC
81.0000 mg | DELAYED_RELEASE_TABLET | Freq: Every day | ORAL | Status: DC
  Administered 2015-11-01 – 2015-11-04 (×4): 81 mg via ORAL
  Filled 2015-11-01 (×4): qty 1

## 2015-11-01 MED ORDER — HEPARIN SODIUM (PORCINE) 5000 UNIT/ML IJ SOLN
5000.0000 [IU] | Freq: Three times a day (TID) | INTRAMUSCULAR | Status: DC
  Administered 2015-11-01 – 2015-11-02 (×2): 5000 [IU] via SUBCUTANEOUS
  Filled 2015-11-01 (×2): qty 1

## 2015-11-01 NOTE — ED Notes (Signed)
The pts wife is going home for [facility] number preferred [phone].   Cell number [phone]

## 2015-11-01 NOTE — ED Triage Notes (Signed)
Pt from home noted to have fever and chills 1 hr ago. Pt states he started feeling "bad once home from dialysis Friday." Pt is alert and ox4 but appears lethargic, vomiting on arrival. Pt very warm to touch.

## 2015-11-01 NOTE — ED Notes (Signed)
MD at bedside. 

## 2015-11-01 NOTE — ED Notes (Signed)
Family at bedside. 

## 2015-11-01 NOTE — H&P (Signed)
History and Physical    [name] DOB: Oct 09, 1950 DOA: 11/01/2015  Referring MD/NP/PA:   PCP: [name], MD   Patient coming from:  The patient is coming from home.  At baseline, pt partially dependent for most of ADL.   Chief Complaint: fever, chills, abdominal pain  HPI: [name] is a 65 y.o. male with medical history significant of end-stage renal disease on dialysis (MWF), CAD, s/p CABG 2013, hyperlipidemia, kidney cancer status post right renal resection (complicated by postop infection with nephrostomy tube that was pulled 6 weeks ago), prostate cancer, hypotension, arthritis, anemia, bilateral blindness, hyperlipidemia, who presents with fever, chills, abdominal pain.  Pt states that he started having fever and chill today. Per family, pt has not been feeling well since Friday. He has generalized weakness and lethargy. He has nausea and vomited once, no diarrhea. He states that he has abdominal pain over right lower quadrant, which is constant, 6 out of 10 in severity, nonradiating. It is not aggravated or alleviated any known factors. Patient denies chest pain, shortness of breath, cough. He states that he still makes a little urine, but denies symptoms of UTI. He had dialysis on Friday. Patient does not have rashes or unilateral weakness.  ED Course: pt was found to have positive urinalysis with large amount of leukocytes, temperature 103.3, no tachycardia, transient tachypnea, lactate is 2.06, lipase 28, potassium 0.6, bicarbonate 27, creatinine 6.99, BUN 86, pending CBC, negative chest x-ray for infiltration. Pt is placed on tele bed for obs.  # CT abdomen/pelvis showed circumferential thickening in the ascending colon with some surrounding inflammatory changes. The wall thickening has increased in the interval from the prior exam. This raises suspicion for possible underlying neoplasm. Direct visualization is recommended. Soft tissue density adjacent to  the hepatic flexure and tip of the liver. Given the patient's clinical history the possibility of local recurrence of previously resected renal carcinoma. Resolution of previously seen inflammatory changes extending to the right abdominal wall. Changes of prior nephrectomy.  Review of Systems:   General: has fevers, chills, no changes in body weight, has poor appetite, has fatigue HEENT: no hearing changes or sore throat. Has bilateral blindness.  Pulm: no dyspnea, coughing, wheezing CV: no chest pain, no palpitations Abd: has nausea, vomiting, abdominal pain, no diarrhea, constipation GU: no dysuria, burning on urination, increased urinary frequency, hematuria  Ext: no leg edema Neuro: no unilateral weakness, numbness, or tingling, no vision change or hearing loss Skin: no rash MSK: No muscle spasm, no deformity, no limitation of range of movement in spin Heme: No easy bruising.  Travel history: No recent long distant travel.  Allergy:  Allergies  Allergen Reactions  . Codeine Other (See Comments)    Makes patient incoherent. STATES MAKES HIM COMATOSE  . Tape Other (See Comments)    Plastic tape tears skin off, please use paper tape instead.    Past Medical History:  Diagnosis Date  . Anemia   . Arthritis    "back, neck" (07/28/2014)  . Blind    both eyes removed   . Bruises easily   . CAD, NATIVE VESSEL 01/08/2008      . Cellulitis late 1980's   "hospitalized; wrapped both legs; several times; no OR for this"  . Colon polyps   . DM type 2 (diabetes mellitus, type 2) ([place])    no medications (07/28/2014)  . ESRD (end stage renal disease) on dialysis [facility])    [name]; Mon, Wed, Fri (  07/28/2014)  . Family history of breast cancer    mother  . HYPERLIPIDEMIA-MIXED 01/08/2008  . HYPERTENSION 01/27/2009   BP low  . Hypotension   . Kidney stones   . Low blood pressure   . OVERWEIGHT/OBESITY 01/08/2008   Lost 205 lbs through diet and exercise.    . Peripheral vascular  disease ([place])   . Pneumonia 2000;s X 1  . Prostate cancer ([place])   . Prosthetic eye globe    both eyes  . Renal cell carcinoma 2001 and 2003   "both kidneys"  . Renal insufficiency     Past Surgical History:  Procedure Laterality Date  . AV FISTULA PLACEMENT Left 02/2010   LFA  . CHOLECYSTECTOMY OPEN  1992  . COLONOSCOPY  06/14/2011   Procedure: COLONOSCOPY;  Surgeon: [name], MD;  Location: [facility];  Service: Endoscopy;  Laterality: N/A;  . COLONOSCOPY N/A 07/24/2012   Procedure: COLONOSCOPY;  Surgeon: [name], MD;  Location: [facility];  Service: Endoscopy;  Laterality: N/A;  . COLONOSCOPY    . COLONOSCOPY WITH PROPOFOL N/A 05/13/2014   Procedure: COLONOSCOPY WITH PROPOFOL;  Surgeon: [name], MD;  [facility]: [facility];  Service: Endoscopy;  Laterality: N/A;  . CORONARY ARTERY BYPASS GRAFT  09/29/2011   Procedure: CORONARY ARTERY BYPASS GRAFTING (CABG);  Surgeon: [name], MD;  Location: [place];  Service: Open Heart Surgery;  Laterality: N/A;  Coronary Artery Bypass Graft times two utilizing the left internal mammary artery and the right greater saphenous vein harvested endoscopically.  . CYSTOSCOPY WITH RETROGRADE PYELOGRAM, URETEROSCOPY AND STENT PLACEMENT Left 07/28/2014   Procedure: CYSTOSCOPY WITH RETROGRADE PYELOGRAM, URETERAL BALLOON DILITATION, URETEROSCOPY AND LEFT STENT PLACEMENT;  Surgeon: [name], MD;  Location: [facility];  Service: Urology;  Laterality: Left;  . ENUCLEATION  2003; 2006   bilateral; "diabetes; pain"  . FOOT AMPUTATION Right ~ 2002   right;  partial; "infection"  . FRACTURE SURGERY    . HIP PINNING,CANNULATED Left 12/31/2013   Procedure: CANNULATED HIP PINNING;  Surgeon: Renette Butters, MD;  Location: [place];  Service: Orthopedics;  Laterality: Left;  Carm, FX Table, [employer]  . HOT HEMOSTASIS  06/14/2011   Procedure: HOT HEMOSTASIS (ARGON PLASMA COAGULATION/BICAP);  Surgeon: [name], MD;  Location: [facility];   Service: Endoscopy;  Laterality: N/A;  . INSERTION PROSTATE RADIATION SEED  03/2012  . LEFT HEART CATHETERIZATION WITH CORONARY ANGIOGRAM N/A 09/27/2011   Procedure: LEFT HEART CATHETERIZATION WITH CORONARY ANGIOGRAM;  Surgeon: [name], MD;  Location: [facility] CATH LAB;  Service: Cardiovascular;  Laterality: N/A;  . LIGATION OF COMPETING BRANCHES OF ARTERIOVENOUS FISTULA Left 07/29/2014   Procedure: LIGATION OF COMPETING BRANCHES OF LEFT ARM ARTERIOVENOUS FISTULA;  Surgeon: [name], MD;  Location: [place];  Service: Vascular;  Laterality: Left;  . NEPHRECTOMY Right 01/30/2015   done at [place]  . RADIOFREQUENCY ABLATION KIDNEY  2010-2012   "twice; one on each side; for cancer"  . REVISON OF ARTERIOVENOUS FISTULA Left 09/01/2015   Procedure: EXPLORATION LEFT LOWER ARM  RADIOCEPHALIC ARTERIOVENOUS FISTULA;  Surgeon: Conrad New Bremen, MD;  Location: [place];  Service: Vascular;  Laterality: Left;  [name] VARICOSE VEIN SURGERY  mid 1980's    BLE; "knees down; both legs; 2 separate times"    Social History:  reports that he has never smoked. He has never used smokeless tobacco. He reports that he does not drink alcohol or use drugs.  Family History:  Family  History  Problem Relation Age of Onset  . Cancer Mother 5    breast, spine and ovarian  . Heart disease Mother   . Hyperlipidemia Mother   . Hypertension Mother   . Varicose Veins Mother   . Emphysema Father   . Cancer Father 32    kidney, prostate  . Heart disease Father   . Hyperlipidemia Father   . Hypertension Father   . Cancer Sister     ovarian  . Cancer Brother     lung  . Heart disease Brother   . Hyperlipidemia Brother   . Hypertension Brother   . Heart attack Brother   . Peripheral vascular disease Brother   . Cancer    . Coronary artery disease    . Kidney disease    . Breast cancer    . Ovarian cancer    . Lung cancer    . Diabetes    . Malignant hyperthermia Neg Hx      Prior to Admission medications     Medication Sig Start Date End Date Taking? Authorizing Provider  aspirin EC 81 MG tablet Take 81 mg by mouth at bedtime.   Yes Historical Provider, MD  atorvastatin (LIPITOR) 10 MG tablet TAKE 1 TABLET BY MOUTH EVERY DAY Patient taking differently: TAKE 1 TABLET BY MOUTH DAILY AT BEDTIME 05/13/14  Yes Thayer Headings, MD  cinacalcet (SENSIPAR) 30 MG tablet Take 30 mg by mouth daily with supper.   Yes Historical Provider, MD  loperamide (IMODIUM A-D) 2 MG tablet Take 2 mg by mouth 4 (four) times daily as needed for diarrhea or loose stools.   Yes Historical Provider, MD  midodrine (PROAMATINE) 10 MG tablet Take 10 mg by mouth See admin instructions. Take 1 tablet (10 mg) by mouth every morning and 1 tablet (10 mg) before lunch   Yes Historical Provider, MD  multivitamin (RENA-VIT) TABS tablet Take 1 tablet by mouth at bedtime.   Yes Historical Provider, MD  Probiotic Product (ALIGN) 4 MG CAPS Take 1 capsule by mouth daily.   Yes Historical Provider, MD  sevelamer carbonate (RENVELA) 800 MG tablet Take 2,400-4,000 mg by mouth See admin instructions. Take 5 tablets (4000 mg) by mouth 3 times daily with meals and 3 tablets (2400 mg) 2 times daily with snacks 06/26/14  Yes Historical Provider, MD  acetaminophen (TYLENOL) 325 MG tablet Take 1-2 tablets (325-650 mg total) by mouth every 6 (six) hours as needed for moderate pain. Patient not taking: Reported on 11/01/2015 09/01/15   Hulen Shouts Rhyne, PA-C  calcitRIOL (ROCALTROL) 0.25 MCG capsule Take 0.25 mcg by mouth 3 (three) times a week. Reported on 08/26/2015    Historical Provider, MD  heparin 5000 UNIT/ML injection Inject 8,000 Units into the skin 3 (three) times a week. Monday, Wednesday, and Friday.    Historical Provider, MD  Methoxy PEG-Epoetin Beta (MIRCERA) 50 MCG/0.3ML SOSY Inject 225 mcg as directed every 14 (fourteen) days.     Historical Provider, MD    Physical Exam: Vitals:   11/01/15 1900 11/01/15 1915 11/01/15 2100 11/01/15 2115  BP: (!)  98/54 105/55 94/59 101/63  Pulse: 78 80 80 79  Resp: 16 18 15 18   Temp:      TempSrc:      SpO2: 96% 96% 97% 100%  Weight:      Height:       General: Not in acute distress HEENT:       Eyes: no scleral icterus. Has bilateral  blindness.       ENT: No discharge from the ears and nose, no pharynx injection, no tonsillar enlargement.        Neck: No JVD, no bruit, no mass felt. Heme: No neck lymph node enlargement. Cardiac: S1/S2, RRR, No murmurs, No gallops or rubs. Pulm:  No rales, wheezing, rhonchi or rubs. Abd: Soft, nondistended, has tenderness over RLQ, no rebound pain, no organomegaly, BS present. GU: No hematuria Ext: No pitting leg edema bilaterally. 2+DP/PT pulse bilaterally. Musculoskeletal: No joint deformities, No joint redness or warmth, no limitation of ROM in spin. Skin: No rashes.  Neuro: Alert, oriented X3, cranial nerves II-XII grossly intact except for bilateral blindness. Moves all extremities normally. Psych: Patient is not psychotic, no suicidal or hemocidal ideation.  Labs on Admission: I have personally reviewed following labs and imaging studies  CBC: No results for input(s): WBC, NEUTROABS, HGB, HCT, MCV, PLT in the last 168 hours. Basic Metabolic Panel:  Recent Labs Lab 11/01/15 1812  NA 144  K 3.6  CL 103  CO2 27  GLUCOSE 131*  BUN 86*  CREATININE 6.99*  CALCIUM 9.6   GFR: Estimated Creatinine Clearance: 12.1 mL/min (by C-G formula based on SCr of 6.99 mg/dL). Liver Function Tests: No results for input(s): AST, ALT, ALKPHOS, BILITOT, PROT, ALBUMIN in the last 168 hours.  Recent Labs Lab 11/01/15 1812  LIPASE 28   No results for input(s): AMMONIA in the last 168 hours. Coagulation Profile: No results for input(s): INR, PROTIME in the last 168 hours. Cardiac Enzymes: No results for input(s): CKTOTAL, CKMB, CKMBINDEX, TROPONINI in the last 168 hours. BNP (last 3 results) No results for input(s): PROBNP in the last 8760  hours. HbA1C: No results for input(s): HGBA1C in the last 72 hours. CBG: No results for input(s): GLUCAP in the last 168 hours. Lipid Profile: No results for input(s): CHOL, HDL, LDLCALC, TRIG, CHOLHDL, LDLDIRECT in the last 72 hours. Thyroid Function Tests: No results for input(s): TSH, T4TOTAL, FREET4, T3FREE, THYROIDAB in the last 72 hours. Anemia Panel: No results for input(s): VITAMINB12, FOLATE, FERRITIN, TIBC, IRON, RETICCTPCT in the last 72 hours. Urine analysis:    Component Value Date/Time   COLORURINE YELLOW 11/01/2015 1917   APPEARANCEUR CLOUDY (A) 11/01/2015 1917   LABSPEC 1.012 11/01/2015 1917   PHURINE 7.0 11/01/2015 1917   GLUCOSEU NEGATIVE 11/01/2015 1917   HGBUR SMALL (A) 11/01/2015 1917   BILIRUBINUR NEGATIVE 11/01/2015 1917   KETONESUR NEGATIVE 11/01/2015 1917   PROTEINUR 100 (A) 11/01/2015 1917   UROBILINOGEN 0.2 07/28/2014 1007   NITRITE NEGATIVE 11/01/2015 1917   LEUKOCYTESUR LARGE (A) 11/01/2015 1917   Sepsis Labs: @LABRCNTIP (procalcitonin:4,lacticidven:4) )No results found for this or any previous visit (from the past 240 hour(s)).   Radiological Exams on Admission: Ct Abdomen Pelvis Wo Contrast  Result Date: 11/01/2015 CLINICAL DATA:  Fever and chills EXAM: CT ABDOMEN AND PELVIS WITHOUT CONTRAST TECHNIQUE: Multidetector CT imaging of the abdomen and pelvis was performed following the standard protocol without IV contrast. COMPARISON:  05/11/2015 FINDINGS: Lower chest:  Minimal right basilar atelectasis is noted. Hepatobiliary: Gallbladder has been surgically removed. The liver is within normal limits Pancreas: No mass or inflammatory process identified on this un-enhanced exam. Spleen: Within normal limits in size. Adrenals/Urinary Tract: The adrenal glands are within normal limits. Changes of prior right nephrectomy are noted. Renal cystic changes are noted on the left stable from the prior study. Stomach/Bowel: Prominent fecal material is noted within the  rectum which may represent a  degree of impaction. No obstructive changes are seen. Inflammatory changes are again noted in the region of the ascending colon. Adjacent to the hepatic flexure and tip of the liver there is a 3.6 cm mildly enhancing soft tissue lesion which appears somewhat larger than that seen on the prior exam. This may represent sequelae from the prior infection The previously seen changes extending through the abdominal wall on the right have resolved. The appendix is within normal limits. Vascular/Lymphatic: No pathologically enlarged lymph nodes. No evidence of abdominal aortic aneurysm. Reproductive: Brachytherapy seeds are noted within the prostate. Other: None. Musculoskeletal: Postsurgical changes are noted in the proximal left femur. Degenerative changes of the lumbar spine are seen. IMPRESSION: Circumferential thickening in the ascending colon with some surrounding inflammatory changes. The wall thickening has increased in the interval from the prior exam. This raises suspicion for possible underlying neoplasm. Direct visualization is recommended. Soft tissue density adjacent to the hepatic flexure and tip of the liver. Given the patient's clinical history the possibility of local recurrence of previously resected renal carcinoma. Resolution of previously seen inflammatory changes extending to the right abdominal wall. Changes of prior nephrectomy. No other acute abnormality is seen. Electronically Signed   By: Inez Catalina M.D.   On: 11/01/2015 19:39  Dg Chest Portable 1 View  Result Date: 11/01/2015 CLINICAL DATA:  Fever and chills EXAM: PORTABLE CHEST 1 VIEW COMPARISON:  05/11/2015 FINDINGS: Chronic elevation of the right diaphragm. Normal heart size and mediastinal contours status post CABG. There is no edema, consolidation, effusion, or pneumothorax. IMPRESSION: Negative for pneumonia. Electronically Signed   By: Monte Fantasia M.D.   On: 11/01/2015 19:10    EKG: Independently  reviewed.  Sinus rhythm, QTC 542, nonspecific T-wave change.  Assessment/Plan Principal Problem:   Sepsis ([place]) Active Problems:   CAD, s/p CABG 2013   ESRD (end stage renal disease) on dialysis ([place])   Renal carcinoma ([place])   DM type 2 (diabetes mellitus, type 2) ([place])   Bilateral blindness   End stage renal failure on dialysis ([place])   HLD (hyperlipidemia)   Abdominal pain   UTI (lower urinary tract infection)   Sepsis ([place]): pt is septic with elevated lactate and fever. CBC is pending. He had one documented hypotension with blood pressure 52/40, which was likely not accurate per ED physician. His current blood pressure 94/59. The source of infection is not clear, but likely due to UTI. Given his recent postop infection with nephrostomy tube that was pulled 6 weeks ago, CT-abdomen/pelvis, which did not show abscess.  -Will place pt on tele bed for obs -IV vancomycin and Zosyn were started in ED, will continue -f/u Bx and Ux -will get Procalcitonin and trend lactic acid levels per sepsis protocol. -IVF: 1.5L of NS bolus in ED, followed by 75 cc/h (patient has ESRD, limiting aggressive IV fluids treatment).  Possible UTI: -as above.  Hx of Hypotension: -continue Midodrine  CAD: s/p CABG. No chest pain. -continue ASA and Lipitor  Renal carcinoma [facility]): s/p of R nephrectomy, complicated by postop infection with nephrostomy tube that was pulled 6 weeks ago. CT-abdomen/pelvis has no new abscess formation. -no acute issues  ESRD (end stage renal disease) on dialysis (MWF): potassium 0.6, bicarbonate 27, creatinine 6.99, BUN 86. -left message to renal box for HD -continue Sensipar, Renvela and Calcitriol  Diet controlled DM-II: Last A1c 5.2, controled. Patient is not taking meds at home -SSI  HLD: Last LDL was 32 on 09/28/11 -Continue home medications: Lipitor  Abdominal pain: Etiology is not clear. Lipase 28. CT abdomen/pelvis did not show new abscess, but showed showed  circumferential thickening in the ascending colon with some surrounding inflammatory changes. The wall thickening has increased in the interval from the prior exam. This raises suspicion for possible underlying neoplasm per radiologist. Soft tissue density adjacent to the hepatic flexure and tip of the liver. Given the patient's clinical history the possibility of local recurrence of previously resected renal carcinoma.  -When necessary hydroxyzine for nausea, morphin for pain -this may need to be discussed with surgeon or urologist in AM.   DVT ppx: SQ heparin (Pt has ERSD, sq heparin is better choice than sq Lovenox)  Code Status: Full code Family Communication: None at bed side.  Disposition Plan:  Anticipate discharge back to previous home environment Consults called:  None Admission status: Obs / tele     Date of Service 11/01/2015    Ivor Costa [facility] Pager [phone]  If 7PM-7AM, please contact night-coverage [email or web address] Password [email or web address] 11/01/2015, 9:52 PM

## 2015-11-01 NOTE — Progress Notes (Signed)
Report obtained from ED RN. Room ready for patient. Sylina Henion Joselita, RN 

## 2015-11-01 NOTE — ED Notes (Signed)
wifes number home preferred [phone]  Cell [phone]

## 2015-11-01 NOTE — ED Notes (Signed)
Soiled pt cleaned.

## 2015-11-01 NOTE — ED Notes (Signed)
This rn transported pt over to CT and back to room.

## 2015-11-01 NOTE — Progress Notes (Addendum)
Pharmacy Antibiotic Note [name] is a 65 y.o. male admitted on 11/01/2015 with IAA.  Pharmacy has been consulted for Zosyn and vancomycin dosing.  Zosyn 3.375 grams IV x 1 and vancomycin 1500 mg x 1 in the ED.   Plan: 1. Begin Zosyn 2.25 grams IV every 8 hours given over 30 minutes  2. F/u IHD schedule and dose vancomycin accordingly   Height: 6\' 2"  (188 cm) Weight: 200 lb (90.7 kg) IBW/kg (Calculated) : 82.2  Temp (24hrs), Avg:101.8 F (38.8 C), Min:100.2 F (37.9 C), Max:103.3 F (39.6 C)     Allergies  Allergen Reactions  . Codeine Other (See Comments)    Makes patient incoherent. STATES MAKES HIM COMATOSE  . Tape Other (See Comments)    Plastic tape tears skin off, please use paper tape instead.    Antimicrobials this admission: 7/30 Zosyn >>  7/30 Vancomycin >>  Dose adjustments this admission: n/a  Microbiology results: px  Thank you for allowing pharmacy to be a part of this patient's care.  Vincenza Hews, PharmD, BCPS 11/01/2015, 6:24 PM Pager: [phone]

## 2015-11-01 NOTE — ED Notes (Signed)
Pt turned on his side

## 2015-11-01 NOTE — Progress Notes (Signed)
Attempted to call report,ED RN unavailable at this time. RN to call back. Jemmie Rhinehart, Wonda Cheng, Therapist, sports

## 2015-11-01 NOTE — ED Provider Notes (Signed)
[name] Provider Note   CSN: [id number] Arrival date & time: 11/01/15  1753  First Provider Contact:  First MD Initiated Contact with Patient 11/01/15 1828     History   Chief Complaint Chief Complaint  Patient presents with  . Fever    HPI [name] is a 65 y.o. male.  The history is provided by the patient. No language interpreter was used.  Fever   This is a new problem. The current episode started 6 to 12 hours ago. The problem occurs constantly. The problem has been gradually worsening. The maximum temperature noted was 101 to 101.9 F. The temperature was taken using an oral thermometer. Associated symptoms include vomiting. Pertinent negatives include no chest pain, no fussiness, no sleepiness, no diarrhea, no congestion, no headaches, no sore throat, no tugging at ear, no muscle aches and no cough. He has tried nothing for the symptoms. The treatment provided no relief.    Past Medical History:  Diagnosis Date  . Anemia   . Arthritis    "back, neck" (07/28/2014)  . Blind    both eyes removed   . Bruises easily   . CAD, NATIVE VESSEL 01/08/2008      . Cellulitis late 1980's   "hospitalized; wrapped both legs; several times; no OR for this"  . Colon polyps   . DM type 2 (diabetes mellitus, type 2) ([place])    no medications (07/28/2014)  . ESRD (end stage renal disease) on dialysis [facility])    [name]; Mon, Wed, Fri (07/28/2014)  . Family history of breast cancer    mother  . HYPERLIPIDEMIA-MIXED 01/08/2008  . HYPERTENSION 01/27/2009   BP low  . Hypotension   . Kidney stones   . Low blood pressure   . OVERWEIGHT/OBESITY 01/08/2008   Lost 205 lbs through diet and exercise.    . Peripheral vascular disease ([place])   . Pneumonia 2000;s X 1  . Prostate cancer ([place])   . Prosthetic eye globe    both eyes  . Renal cell carcinoma 2001 and 2003   "both kidneys"  . Renal insufficiency     Patient Active Problem List   Diagnosis Date Noted  . Abdominal  pain 11/01/2015  . UTI (lower urinary tract infection) 11/01/2015  . Sepsis ([place]) 11/01/2015  . Bilateral blindness 06/16/2015  . Carcinoma of kidney ([place]) 06/16/2015  . Type 2 diabetes mellitus ([place]) 06/16/2015  . Intra-abdominal infection 05/11/2015  . Chronic anemia 05/11/2015  . Infection in abdomen ([place]) 05/11/2015  . Second degree AV block, Mobitz type I 02/15/2015  . Atrioventricular block, Mobitz type 1, Wenckebach 02/15/2015  . Hypotension 02/14/2015  . Hypoglycemia 02/13/2015  . DM type 2, goal A1c below 7 02/13/2015  . Blind   . DM type 2 (diabetes mellitus, type 2) ([place])   . Clear cell renal cell carcinoma (HCC)   . CAD in native artery 09/10/2014  . Renal carcinoma ([place])   . Non-intractable cyclical vomiting with nausea   . Hydronephrosis 07/28/2014  . Coccyx pain 01/03/2014  . Thrombocytopenia ([place]) 01/03/2014  . Intertrochanteric fracture of left hip ([place]) 12/30/2013  . Dialysis patient ([place]) 12/30/2013  . Hip fracture ([place]) 12/30/2013  . Dependence on renal dialysis ([place]) 12/30/2013  . Hypotension of hemodialysis 06/19/2013  . Malignant neoplasm of prostate ([place]) 01/30/2012  . Diabetes mellitus ([place]) 08/30/2011  . Peripheral vascular disease ([place]) 05/27/2009  . COLONIC POLYPS 02/12/2009  . ANEMIA 01/27/2009  . GLAUCOMA 01/27/2009  . Essential  hypertension 01/27/2009  . ESRD (end stage renal disease) on dialysis ([place]) 01/27/2009  . End stage renal failure on dialysis ([place]) 01/27/2009  . DIAB W/UNS COMP TYPE II/UNS NOT STATED UNCNTRL 01/08/2008  . HYPERLIPIDEMIA-MIXED 01/08/2008  . OVERWEIGHT/OBESITY 01/08/2008  . DEPRESSIVE DISORDER NOT ELSEWHERE CLASSIFIED 01/08/2008  . CAD, s/p CABG 2013 01/08/2008  . EDEMA 01/08/2008  . Clinical depression 01/08/2008  . HLD (hyperlipidemia) 01/08/2008    Past Surgical History:  Procedure Laterality Date  . AV FISTULA PLACEMENT Left 02/2010   LFA  . CHOLECYSTECTOMY OPEN  1992  . COLONOSCOPY  06/14/2011   Procedure:  COLONOSCOPY;  Surgeon: [name], MD;  Location: [facility];  Service: Endoscopy;  Laterality: N/A;  . COLONOSCOPY N/A 07/24/2012   Procedure: COLONOSCOPY;  Surgeon: [name], MD;  Location: [facility];  Service: Endoscopy;  Laterality: N/A;  . COLONOSCOPY    . COLONOSCOPY WITH PROPOFOL N/A 05/13/2014   Procedure: COLONOSCOPY WITH PROPOFOL;  Surgeon: [name], MD;  Location: [facility];  Service: Endoscopy;  Laterality: N/A;  . CORONARY ARTERY BYPASS GRAFT  09/29/2011   Procedure: CORONARY ARTERY BYPASS GRAFTING (CABG);  Surgeon: [name], MD;  Location: [facility];  Service: Open Heart Surgery;  Laterality: N/A;  Coronary Artery Bypass Graft times two utilizing the left internal mammary artery and the right greater saphenous vein harvested endoscopically.  . CYSTOSCOPY WITH RETROGRADE PYELOGRAM, URETEROSCOPY AND STENT PLACEMENT Left 07/28/2014   Procedure: CYSTOSCOPY WITH RETROGRADE PYELOGRAM, URETERAL BALLOON DILITATION, URETEROSCOPY AND LEFT STENT PLACEMENT;  Surgeon: [name], MD;  Location: [facility];  Service: Urology;  Laterality: Left;  . ENUCLEATION  2003; 2006   bilateral; "diabetes; pain"  . FOOT AMPUTATION Right ~ 2002   right;  partial; "infection"  . FRACTURE SURGERY    . HIP PINNING,CANNULATED Left 12/31/2013   Procedure: CANNULATED HIP PINNING;  Surgeon: Renette Butters, MD;  Location: [facility];  Service: Orthopedics;  Laterality: Left;  Carm, FX Table, [employer]  . HOT HEMOSTASIS  06/14/2011   Procedure: HOT HEMOSTASIS (ARGON PLASMA COAGULATION/BICAP);  Surgeon: [name], MD;  Location[facility];  Service: Endoscopy;  Laterality: N/A;  . INSERTION PROSTATE RADIATION SEED  03/2012  . LEFT HEART CATHETERIZATION WITH CORONARY ANGIOGRAM N/A 09/27/2011   Procedure: LEFT HEART CATHETERIZATION WITH CORONARY ANGIOGRAM;  Surgeon: [name], MD;  Location: [facility] C[facility] LAB;  Service: Cardiovascular;  Laterality: N/A;  . LIGATION OF COMPETING BRANCHES OF  ARTERIOVENOUS FISTULA Left 07/29/2014   Procedure: LIGATION OF COMPETING BRANCHES OF LEFT ARM ARTERIOVENOUS FISTULA;  Surgeon: [name], MD;  Location: [facility];  Service: Vascular;  Laterality: Left;  . NEPHRECTOMY Right 01/30/2015   done at [facility]  . RADIOFREQUENCY ABLATION KIDNEY  2010-2012   "twice; one on each side; for cancer"  . REVISON OF ARTERIOVENOUS FISTULA Left 09/01/2015   Procedure: EXPLORATION LEFT LOWER ARM  RADIOCEPHALIC ARTERIOVENOUS FISTULA;  Surgeon: Conrad , MD;  Location: [facility];  Service: Vascular;  Laterality: Left;  [name] VARICOSE VEIN SURGERY  mid 1980's    BLE; "knees down; both legs; 2 separate times"       Home Medications    Prior to Admission medications   Medication Sig Start Date End Date Taking? Authorizing Provider  aspirin EC 81 MG tablet Take 81 mg by mouth at bedtime.   Yes Historical Provider, MD  atorvastatin (LIPITOR) 10 MG tablet TAKE 1 TABLET BY MOUTH EVERY DAY Patient taking differently: TAKE 1  TABLET BY MOUTH DAILY AT BEDTIME 05/13/14  Yes Thayer Headings, MD  cinacalcet (SENSIPAR) 30 MG tablet Take 30 mg by mouth daily with supper.   Yes Historical Provider, MD  loperamide (IMODIUM A-D) 2 MG tablet Take 2 mg by mouth 4 (four) times daily as needed for diarrhea or loose stools.   Yes Historical Provider, MD  midodrine (PROAMATINE) 10 MG tablet Take 10 mg by mouth See admin instructions. Take 1 tablet (10 mg) by mouth every morning and 1 tablet (10 mg) before lunch   Yes Historical Provider, MD  multivitamin (RENA-VIT) TABS tablet Take 1 tablet by mouth at bedtime.   Yes Historical Provider, MD  Probiotic Product (ALIGN) 4 MG CAPS Take 1 capsule by mouth daily.   Yes Historical Provider, MD  sevelamer carbonate (RENVELA) 800 MG tablet Take 2,400-4,000 mg by mouth See admin instructions. Take 5 tablets (4000 mg) by mouth 3 times daily with meals and 3 tablets (2400 mg) 2 times daily with snacks 06/26/14  Yes Historical Provider, MD    acetaminophen (TYLENOL) 325 MG tablet Take 1-2 tablets (325-650 mg total) by mouth every 6 (six) hours as needed for moderate pain. Patient not taking: Reported on 11/01/2015 09/01/15   Hulen Shouts Rhyne, PA-C  calcitRIOL (ROCALTROL) 0.25 MCG capsule Take 0.25 mcg by mouth 3 (three) times a week. Reported on 08/26/2015    Historical Provider, MD  heparin 5000 UNIT/ML injection Inject 8,000 Units into the skin 3 (three) times a week. Monday, Wednesday, and Friday.    Historical Provider, MD  Methoxy PEG-Epoetin Beta (MIRCERA) 50 MCG/0.3ML SOSY Inject 225 mcg as directed every 14 (fourteen) days.     Historical Provider, MD    Family History Family History  Problem Relation Age of Onset  . Cancer Mother 54    breast, spine and ovarian  . Heart disease Mother   . Hyperlipidemia Mother   . Hypertension Mother   . Varicose Veins Mother   . Emphysema Father   . Cancer Father 42    kidney, prostate  . Heart disease Father   . Hyperlipidemia Father   . Hypertension Father   . Cancer Sister     ovarian  . Cancer Brother     lung  . Heart disease Brother   . Hyperlipidemia Brother   . Hypertension Brother   . Heart attack Brother   . Peripheral vascular disease Brother   . Cancer    . Coronary artery disease    . Kidney disease    . Breast cancer    . Ovarian cancer    . Lung cancer    . Diabetes    . Malignant hyperthermia Neg Hx     Social History Social History  Substance Use Topics  . Smoking status: Never Smoker  . Smokeless tobacco: Never Used  . Alcohol use No     Allergies   Codeine and Tape   Review of Systems Review of Systems  Constitutional: Positive for chills and fever.  HENT: Negative for congestion, ear pain and sore throat.   Eyes: Negative for pain and visual disturbance.  Respiratory: Negative for cough and shortness of breath.   Cardiovascular: Negative for chest pain and palpitations.  Gastrointestinal: Positive for abdominal pain, nausea and  vomiting. Negative for diarrhea.  Genitourinary: Negative for dysuria and hematuria.  Musculoskeletal: Negative for arthralgias and back pain.  Skin: Negative for color change and rash.  Neurological: Negative for seizures, syncope and headaches.  All  other systems reviewed and are negative.    Physical Exam Updated Vital Signs BP 105/55   Pulse 80   Temp (!) 103.3 F (39.6 C) (Rectal)   Resp 18   Ht 6\' 2"  (1.88 m)   Wt 90.7 kg   SpO2 96%   BMI 25.68 kg/m   Physical Exam  Constitutional: He appears well-developed and well-nourished. He appears lethargic.  HENT:  Head: Normocephalic and atraumatic.  Eyes: Conjunctivae are normal.  Neck: Neck supple.  Cardiovascular: Normal rate and regular rhythm.   No murmur heard. Pulmonary/Chest: Effort normal and breath sounds normal. No respiratory distress.  Abdominal: Soft. He exhibits no mass. There is tenderness. There is no rebound and no guarding.  Musculoskeletal: He exhibits no edema.  Neurological: He appears lethargic. No cranial nerve deficit or sensory deficit. GCS eye subscore is 3. GCS verbal subscore is 4. GCS motor subscore is 6.  Skin: Skin is warm and dry.  Psychiatric: He has a normal mood and affect.  Nursing note and vitals reviewed.    ED Treatments / Results  Labs (all labs ordered are listed, but only abnormal results are displayed) Labs Reviewed  URINALYSIS, ROUTINE W REFLEX MICROSCOPIC (NOT AT [facility]) - Abnormal; Notable for the following:       Result Value   APPearance CLOUDY (*)    Hgb urine dipstick SMALL (*)    Protein, ur 100 (*)    Leukocytes, UA LARGE (*)    All other components within normal limits  BASIC METABOLIC PANEL - Abnormal; Notable for the following:    Glucose, Bld 131 (*)    BUN 86 (*)    Creatinine, Ser 6.99 (*)    GFR calc non Af Amer 7 (*)    GFR calc Af Amer 8 (*)    All other components within normal limits  URINE MICROSCOPIC-ADD ON - Abnormal; Notable for the following:      Squamous Epithelial / LPF 0-5 (*)    Bacteria, UA MANY (*)    All other components within normal limits  I-STAT CG4 LACTIC ACID, ED - Abnormal; Notable for the following:    Lactic Acid, Venous 2.06 (*)    All other components within normal limits  CULTURE, BLOOD (ROUTINE X 2)  CULTURE, BLOOD (ROUTINE X 2)  URINE CULTURE  LIPASE, BLOOD  CBC WITH DIFFERENTIAL/PLATELET  LACTIC ACID, PLASMA  LACTIC ACID, PLASMA  PROCALCITONIN  PROTIME-INR  APTT  BASIC METABOLIC PANEL  CBC  I-STAT CG4 LACTIC ACID, ED  I-STAT CG4 LACTIC ACID, ED    EKG  EKG Interpretation  Date/Time:  Sunday November 01 2015 18:03:55 EDT Ventricular Rate:  75 PR Interval:    QRS Duration: 86 QT Interval:  485 QTC Calculation: 542 R Axis:   23 Text Interpretation:  Sinus rhythm Prolonged PR interval Borderline low voltage, extremity leads No significant change since last tracing Confirmed by FLOYD MD, DANIEL [phone]) on 11/01/2015 7:19:58 PM       Radiology Ct Abdomen Pelvis Wo Contrast  Result Date: 11/01/2015 CLINICAL DATA:  Fever and chills EXAM: CT ABDOMEN AND PELVIS WITHOUT CONTRAST TECHNIQUE: Multidetector CT imaging of the abdomen and pelvis was performed following the standard protocol without IV contrast. COMPARISON:  05/11/2015 FINDINGS: Lower chest:  Minimal right basilar atelectasis is noted. Hepatobiliary: Gallbladder has been surgically removed. The liver is within normal limits Pancreas: No mass or inflammatory process identified on this un-enhanced exam. Spleen: Within normal limits in size. Adrenals/Urinary Tract: The adrenal glands  are within normal limits. Changes of prior right nephrectomy are noted. Renal cystic changes are noted on the left stable from the prior study. Stomach/Bowel: Prominent fecal material is noted within the rectum which may represent a degree of impaction. No obstructive changes are seen. Inflammatory changes are again noted in the region of the ascending colon. Adjacent to  the hepatic flexure and tip of the liver there is a 3.6 cm mildly enhancing soft tissue lesion which appears somewhat larger than that seen on the prior exam. This may represent sequelae from the prior infection The previously seen changes extending through the abdominal wall on the right have resolved. The appendix is within normal limits. Vascular/Lymphatic: No pathologically enlarged lymph nodes. No evidence of abdominal aortic aneurysm. Reproductive: Brachytherapy seeds are noted within the prostate. Other: None. Musculoskeletal: Postsurgical changes are noted in the proximal left femur. Degenerative changes of the lumbar spine are seen. IMPRESSION: Circumferential thickening in the ascending colon with some surrounding inflammatory changes. The wall thickening has increased in the interval from the prior exam. This raises suspicion for possible underlying neoplasm. Direct visualization is recommended. Soft tissue density adjacent to the hepatic flexure and tip of the liver. Given the patient's clinical history the possibility of local recurrence of previously resected renal carcinoma. Resolution of previously seen inflammatory changes extending to the right abdominal wall. Changes of prior nephrectomy. No other acute abnormality is seen. Electronically Signed   By: Inez Catalina M.D.   On: 11/01/2015 19:39  Dg Chest Portable 1 View  Result Date: 11/01/2015 CLINICAL DATA:  Fever and chills EXAM: PORTABLE CHEST 1 VIEW COMPARISON:  05/11/2015 FINDINGS: Chronic elevation of the right diaphragm. Normal heart size and mediastinal contours status post CABG. There is no edema, consolidation, effusion, or pneumothorax. IMPRESSION: Negative for pneumonia. Electronically Signed   By: Monte Fantasia M.D.   On: 11/01/2015 19:10   Procedures Procedures (including critical care time)  Medications Ordered in ED Medications  piperacillin-tazobactam (ZOSYN) IVPB 2.25 g (not administered)  aspirin EC tablet 81 mg  (not administered)  cinacalcet (SENSIPAR) tablet 30 mg (not administered)  loperamide (IMODIUM A-D) tablet 2 mg (not administered)  multivitamin (RENA-VIT) tablet 1 tablet (not administered)  ALIGN CAPS 4 mg (not administered)  calcitRIOL (ROCALTROL) capsule 0.25 mcg (not administered)  sevelamer carbonate (RENVELA) tablet 2,400-4,000 mg (not administered)  atorvastatin (LIPITOR) tablet 10 mg (not administered)  midodrine (PROAMATINE) tablet 10 mg (not administered)  hydrOXYzine (VISTARIL) injection 25 mg (not administered)  morphine 2 MG/ML injection 2 mg (not administered)  0.9 %  sodium chloride infusion (not administered)  heparin injection 5,000 Units (not administered)  sodium chloride flush (NS) 0.9 % injection 3 mL (not administered)  acetaminophen (TYLENOL) tablet 650 mg (not administered)    Or  acetaminophen (TYLENOL) suppository 650 mg (not administered)  piperacillin-tazobactam (ZOSYN) IVPB 3.375 g (0 g Intravenous Stopped 11/01/15 1857)  vancomycin (VANCOCIN) 1,500 mg in sodium chloride 0.9 % 500 mL IVPB (1,500 mg Intravenous New Bag/Given 11/01/15 1834)  sodium chloride 0.9 % bolus 1,000 mL (0 mLs Intravenous Stopped 11/01/15 1857)     Initial Impression / Assessment and Plan / ED Course  I have reviewed the triage vital signs and the nursing notes.  Pertinent labs & imaging results that were available during my care of the patient were reviewed by me and considered in my medical decision making (see chart for details).  Clinical Course    Patient is a 51 showed gentleman with extensive past medical  history, end-stage renal disease on dialysis Monday, Wednesday, Friday, CAD, hyperlipidemia, kidney cancer status post right renal resection (complicated by postop infection with nephrostomy tube that was pulled 6 weeks ago) who presents tonight for evaluation with his family for fever, chills.  On exam patient with temperature 103.3. Blood pressure soft but near baseline per  family. Patient blind and somewhat somnolent throughout my exam. He has normal heart-lung exam. He has diffuse abdominal tenderness to exam without any rebound or guarding.  Concern for sepsis, code sepsis initiated. Vancomycin and Zosyn given. 1 L normal saline given. No indication for 30 mL/kg of IV fluids as patient has a lactate less than for blood pressure less than 90.  Recorded blood pressure of 50 systolic entered in error.  CT abdomen pelvis without contrast performed to evaluate for potential abscess. CT scan positive for circumferential thickening in the ascending colon with some surrounding inflammation. No appreciable abscess on CT scan.  Repeat lactic acid 1.05. UA with cloudy urine, many bacteria but negative nitrites. Incompletely clear cause of sepsis however patient high risk due to dialysis patient, recent abdominal manipulation.  We'll admit to hospitalist service for culture results, reevaluation.  Family updated on plan of care. Patient admitted to hospital service with no further emergency department events. Patient remained hemodynamic stable throughout his emergency department stay.  Discussed case my attending, Dr. Tyrone Nine  Final Clinical Impressions(s) / ED Diagnoses   Final diagnoses:  Fever and chills  SIRS (systemic inflammatory response syndrome) ([place])    New Prescriptions New Prescriptions   No medications on file     Vira Blanco, MD 11/01/15 2133    Deno Etienne, DO 11/01/15 2148

## 2015-11-01 NOTE — ED Notes (Signed)
1,000 mg of Tylenol given by ems, and 4mg  Zofran.

## 2015-11-01 NOTE — ED Notes (Signed)
Second set of cultures drawn by lab tech.

## 2015-11-02 ENCOUNTER — Encounter (HOSPITAL_COMMUNITY): Payer: Self-pay | Admitting: *Deleted

## 2015-11-02 DIAGNOSIS — N186 End stage renal disease: Secondary | ICD-10-CM | POA: Diagnosis not present

## 2015-11-02 DIAGNOSIS — E1100 Type 2 diabetes mellitus with hyperosmolarity without nonketotic hyperglycemic-hyperosmolar coma (NKHHC): Secondary | ICD-10-CM | POA: Diagnosis not present

## 2015-11-02 DIAGNOSIS — A419 Sepsis, unspecified organism: Secondary | ICD-10-CM

## 2015-11-02 DIAGNOSIS — R1031 Right lower quadrant pain: Secondary | ICD-10-CM

## 2015-11-02 DIAGNOSIS — Z992 Dependence on renal dialysis: Secondary | ICD-10-CM

## 2015-11-02 DIAGNOSIS — I2510 Atherosclerotic heart disease of native coronary artery without angina pectoris: Secondary | ICD-10-CM | POA: Diagnosis not present

## 2015-11-02 DIAGNOSIS — R933 Abnormal findings on diagnostic imaging of other parts of digestive tract: Secondary | ICD-10-CM

## 2015-11-02 DIAGNOSIS — H540 Blindness, both eyes: Secondary | ICD-10-CM | POA: Diagnosis not present

## 2015-11-02 DIAGNOSIS — A4151 Sepsis due to Escherichia coli [E. coli]: Secondary | ICD-10-CM | POA: Diagnosis not present

## 2015-11-02 DIAGNOSIS — R935 Abnormal findings on diagnostic imaging of other abdominal regions, including retroperitoneum: Secondary | ICD-10-CM

## 2015-11-02 LAB — BLOOD CULTURE ID PANEL (REFLEXED)
Acinetobacter baumannii: NOT DETECTED
CANDIDA ALBICANS: NOT DETECTED
CANDIDA GLABRATA: NOT DETECTED
CANDIDA KRUSEI: NOT DETECTED
CANDIDA PARAPSILOSIS: NOT DETECTED
CANDIDA TROPICALIS: NOT DETECTED
Carbapenem resistance: NOT DETECTED
ENTEROBACTER CLOACAE COMPLEX: NOT DETECTED
ENTEROBACTERIACEAE SPECIES: DETECTED — AB
ENTEROCOCCUS SPECIES: NOT DETECTED
Escherichia coli: DETECTED — AB
Haemophilus influenzae: NOT DETECTED
KLEBSIELLA OXYTOCA: NOT DETECTED
KLEBSIELLA PNEUMONIAE: NOT DETECTED
Listeria monocytogenes: NOT DETECTED
Methicillin resistance: NOT DETECTED
Neisseria meningitidis: NOT DETECTED
PROTEUS SPECIES: NOT DETECTED
PSEUDOMONAS AERUGINOSA: NOT DETECTED
STAPHYLOCOCCUS AUREUS BCID: NOT DETECTED
STREPTOCOCCUS AGALACTIAE: NOT DETECTED
STREPTOCOCCUS PNEUMONIAE: NOT DETECTED
Serratia marcescens: NOT DETECTED
Staphylococcus species: NOT DETECTED
Streptococcus pyogenes: NOT DETECTED
Streptococcus species: NOT DETECTED
VANCOMYCIN RESISTANCE: NOT DETECTED

## 2015-11-02 LAB — CBC
HCT: 25.4 % — ABNORMAL LOW (ref 39.0–52.0)
Hemoglobin: 8.2 g/dL — ABNORMAL LOW (ref 13.0–17.0)
MCH: 34.3 pg — ABNORMAL HIGH (ref 26.0–34.0)
MCHC: 32.3 g/dL (ref 30.0–36.0)
MCV: 106.3 fL — AB (ref 78.0–100.0)
PLATELETS: 59 10*3/uL — AB (ref 150–400)
RBC: 2.39 MIL/uL — ABNORMAL LOW (ref 4.22–5.81)
RDW: 14.3 % (ref 11.5–15.5)
WBC: 5 10*3/uL (ref 4.0–10.5)

## 2015-11-02 LAB — BASIC METABOLIC PANEL
Anion gap: 11 (ref 5–15)
BUN: 90 mg/dL — AB (ref 6–20)
CHLORIDE: 106 mmol/L (ref 101–111)
CO2: 28 mmol/L (ref 22–32)
CREATININE: 7.68 mg/dL — AB (ref 0.61–1.24)
Calcium: 9.3 mg/dL (ref 8.9–10.3)
GFR calc Af Amer: 8 mL/min — ABNORMAL LOW (ref >60)
GFR, EST NON AFRICAN AMERICAN: 6 mL/min — AB (ref >60)
GLUCOSE: 132 mg/dL — AB (ref 65–99)
Potassium: 3.7 mmol/L (ref 3.5–5.1)
SODIUM: 145 mmol/L (ref 135–145)

## 2015-11-02 LAB — URINE CULTURE: CULTURE: NO GROWTH

## 2015-11-02 LAB — GLUCOSE, CAPILLARY
Glucose-Capillary: 97 mg/dL (ref 65–99)
Glucose-Capillary: 89 mg/dL (ref 65–99)
Glucose-Capillary: 76 mg/dL (ref 65–99)
Glucose-Capillary: 74 mg/dL (ref 65–99)

## 2015-11-02 LAB — MRSA PCR SCREENING: MRSA by PCR: NEGATIVE

## 2015-11-02 MED ORDER — PEG-KCL-NACL-NASULF-NA ASC-C 100 G PO SOLR: 1.0000 | Freq: Once | ORAL | Status: DC

## 2015-11-02 MED ORDER — METOCLOPRAMIDE HCL 5 MG/ML IJ SOLN
5.0000 mg | Freq: Once | INTRAMUSCULAR | Status: AC
  Administered 2015-11-02: 5 mg via INTRAVENOUS
  Filled 2015-11-02: qty 2

## 2015-11-02 MED ORDER — PEG-KCL-NACL-NASULF-NA ASC-C 100 G PO SOLR
1.0000 | Freq: Once | ORAL | Status: AC
  Administered 2015-11-03: 200 g via ORAL
  Filled 2015-11-02: qty 1

## 2015-11-02 MED ORDER — CALCITRIOL 0.5 MCG PO CAPS
0.5000 ug | ORAL_CAPSULE | ORAL | Status: DC
  Administered 2015-11-04: 0.5 ug via ORAL

## 2015-11-02 MED ORDER — SODIUM CHLORIDE 0.9 % IV BOLUS (SEPSIS): 250.0000 mL | Freq: Once | INTRAVENOUS | Status: DC

## 2015-11-02 MED ORDER — VANCOMYCIN HCL IN DEXTROSE 1-5 GM/200ML-% IV SOLN
INTRAVENOUS | Status: AC
  Administered 2015-11-02: 1000 mg via INTRAVENOUS
  Filled 2015-11-02: qty 200

## 2015-11-02 MED ORDER — METOCLOPRAMIDE HCL 5 MG/ML IJ SOLN
5.0000 mg | Freq: Once | INTRAMUSCULAR | Status: AC
  Administered 2015-11-03: 5 mg via INTRAVENOUS
  Filled 2015-11-02: qty 2

## 2015-11-02 MED ORDER — PEG-KCL-NACL-NASULF-NA ASC-C 100 G PO SOLR
1.0000 | Freq: Once | ORAL | Status: AC
  Administered 2015-11-02: 200 g via ORAL
  Filled 2015-11-02: qty 1

## 2015-11-02 MED ORDER — DARBEPOETIN ALFA 100 MCG/0.5ML IJ SOSY
100.0000 ug | PREFILLED_SYRINGE | INTRAMUSCULAR | Status: DC
  Filled 2015-11-02: qty 0.5

## 2015-11-02 MED ORDER — BISACODYL 5 MG PO TBEC
5.0000 mg | DELAYED_RELEASE_TABLET | Freq: Four times a day (QID) | ORAL | Status: AC
  Administered 2015-11-02 (×2): 5 mg via ORAL
  Filled 2015-11-02 (×2): qty 1

## 2015-11-02 MED ORDER — VANCOMYCIN HCL IN DEXTROSE 1-5 GM/200ML-% IV SOLN
1000.0000 mg | INTRAVENOUS | Status: DC
  Administered 2015-11-02: 1000 mg via INTRAVENOUS
  Filled 2015-11-02: qty 200

## 2015-11-02 NOTE — Progress Notes (Signed)
[facility] PROGRESS NOTE  [name] [id number] DOB: 1950-12-21 DOA: 11/01/2015   PCP: [name], MD     Assessment/Plan: Principal Problem:   Sepsis [facility]) Active Problems:   CAD, s/p CABG 2013   ESRD (end stage renal disease) on dialysis ([place])   Renal carcinoma ([name])   DM type 2 (diabetes mellitus, type 2) ([place])   Bilateral blindness   End stage renal failure on dialysis ([place])   HLD (hyperlipidemia)   Abdominal pain   UTI (lower urinary tract infection)   65 y.o. male with medical history significant of end-stage renal disease on dialysis (MWF), CAD, s/p CABG 2013, hyperlipidemia, kidney cancer status post right renal resection (S/p right nephrectomy in 01/2015. post-operative course c/b recurrent abscesses between the nephrectomy and abdominal wall. S/p RLQ drain 07/2015 and treated with Vanc/ Cefepime/ Flagyl. rains removed in 09/03/15), r, prostate cancer, hypotension, arthritis, anemia, bilateral blindness, hyperlipidemia, who presents with fever, chills, abdominal pain.CT abdomen/pelvis showed circumferential thickening in the ascending colon with some surrounding inflammatory changes, cannot rule out underlying neoplasm    Assessment and plan Sepsis ([place]) with gram-negative rods: pt is septic with elevated lactate and fever.  Hypotensive in the ER, 94/59. The source of infection is likely intra-abdominal, colonic versus renal abscess / UTI. Given his recent postop infection with nephrostomy tube that was pulled 6 weeks ago, CT-abdomen/pelvis, which did not show abscess. Continue -IV vancomycin and Zosyn were started in ED, will continue -f/u Bx and Ux   Procalcitonin 45.38 -IVF: 1.5L of NS bolus in ED, followed by 75 cc/h (patient has ESRD, limiting aggressive IV fluids treatment). Blood culture positive for gram-negative rods Anticipate discontinuing vancomycin tomorrow if blood cultures remain negative for gram-positive organisms  Mild UTI-continue  antibiotics as above  Hx of Hypotension: -continue Midodrine  CAD: s/p CABG. No chest pain. -continue ASA and Lipitor  Renal carcinoma [facility]): s/p of R nephrectomy, complicated by postop infection with nephrostomy tube that was pulled 6 weeks ago. CT-abdomen/pelvis has no new abscess formation. -no acute issues  ESRD (end stage renal disease) on dialysis (MWF): potassium 0.6, bicarbonate 27, creatinine 6.99, BUN 86. -left message to renal box for HD -continue Sensipar, Renvela and Calcitriol  Diet controlled DM-II: Last A1c 5.2, controled. Patient is not taking meds at home -SSI  HLD: Last LDL was 32 on 09/28/11 -Continue home medications: Lipitor  Abdominal pain: Etiology is not clear. Lipase 28. CT abdomen/pelvis did not show new abscess, but showed showed circumferential thickening in the ascending colon with some surrounding inflammatory changes. The wall thickening has increased in the interval from the prior exam. This raises suspicion for possible underlying neoplasm per radiologist. Soft tissue density adjacent to the hepatic flexure and tip of the liver. Given the patient's clinical history the possibility of local recurrence of previously resected renal carcinoma. Vs  Primary colon cancer, will consult gastroenterology for possible colonoscopy      Anemia of chronic disease/thrombocytopenia No evidence of GI bleeding, platelets down likely secondary to sepsis Discontinue heparin    DVT prophylaxsis heparin     Code Status:  Full code     Family Communication: Discussed in detail with the patient, all imaging results, lab results explained to the patient   Disposition Plan:  Anticipate discharge in the next 3-4 days      Consultants:   Gastroenterology  Nephrology  Procedures:  None    Antibiotics: Anti-infectives    Start  Dose/Rate Route Frequency Ordered Stop   11/01/15 2200  piperacillin-tazobactam (ZOSYN) IVPB 2.25 g     2.25 g 100  mL/hr over 30 Minutes Intravenous Every 8 hours 11/01/15 1821     11/01/15 1830  piperacillin-tazobactam (ZOSYN) IVPB 3.375 g     3.375 g 100 mL/hr over 30 Minutes Intravenous  Once 11/01/15 1818 11/01/15 1857   11/01/15 1830  vancomycin (VANCOCIN) 1,500 mg in sodium chloride 0.9 % 500 mL IVPB     1,500 mg 250 mL/hr over 120 Minutes Intravenous  Once 11/01/15 1818 11/01/15 2034         HPI/Subjective: Wife is by the bedside, patient is hungry and would like to eat  Objective: Vitals:   11/01/15 2248 11/01/15 2331 11/02/15 0552 11/02/15 0808  BP:  (!) 94/55 128/62 118/67  Pulse:  75 70 66  Resp:  18 18 17   Temp: 99.1 F (37.3 C) 98.6 F (37 C) 99.1 F (37.3 C) 99.2 F (37.3 C)  TempSrc: Oral Oral Oral Oral  SpO2:  100% 100% 100%  Weight:  92.5 kg (203 lb 14.8 oz)    Height:  6\' 4"  (1.93 m)      Intake/Output Summary (Last 24 hours) at 11/02/15 0830 Last data filed at 11/02/15 [id number]  Gross per 24 hour  Intake             1050 ml  Output              200 ml  Net              850 ml    Exam:  Examination:  General exam: Appears calm and comfortable  Respiratory system: Clear to auscultation. Respiratory effort normal. Cardiovascular system: S1 & S2 heard, RRR. No JVD, murmurs, rubs, gallops or clicks. No pedal edema. Gastrointestinal system: Abdomen is nondistended, soft and nontender. No organomegaly or masses felt. Normal bowel sounds heard. Central nervous system: Alert and oriented. No focal neurological deficits. Extremities: Symmetric 5 x 5 power. Skin: No rashes, lesions or ulcers Psychiatry: Judgement and insight appear normal. Mood & affect appropriate.     Data Reviewed: I have personally reviewed following labs and imaging studies  Micro Results Recent Results (from the past 240 hour(s))  MRSA PCR Screening     Status: None   Collection Time: 11/02/15 12:09 AM  Result Value Ref Range Status   MRSA by PCR NEGATIVE NEGATIVE Final    Comment:         The GeneXpert MRSA Assay (FDA approved for NASAL specimens only), is one component of a comprehensive MRSA colonization surveillance program. It is not intended to diagnose MRSA infection nor to guide or monitor treatment for MRSA infections.     Radiology Reports Ct Abdomen Pelvis Wo Contrast  Result Date: 11/01/2015 CLINICAL DATA:  Fever and chills EXAM: CT ABDOMEN AND PELVIS WITHOUT CONTRAST TECHNIQUE: Multidetector CT imaging of the abdomen and pelvis was performed following the standard protocol without IV contrast. COMPARISON:  05/11/2015 FINDINGS: Lower chest:  Minimal right basilar atelectasis is noted. Hepatobiliary: Gallbladder has been surgically removed. The liver is within normal limits Pancreas: No mass or inflammatory process identified on this un-enhanced exam. Spleen: Within normal limits in size. Adrenals/Urinary Tract: The adrenal glands are within normal limits. Changes of prior right nephrectomy are noted. Renal cystic changes are noted on the left stable from the prior study. Stomach/Bowel: Prominent fecal material is noted within the rectum which may represent a degree of impaction.  No obstructive changes are seen. Inflammatory changes are again noted in the region of the ascending colon. Adjacent to the hepatic flexure and tip of the liver there is a 3.6 cm mildly enhancing soft tissue lesion which appears somewhat larger than that seen on the prior exam. This may represent sequelae from the prior infection The previously seen changes extending through the abdominal wall on the right have resolved. The appendix is within normal limits. Vascular/Lymphatic: No pathologically enlarged lymph nodes. No evidence of abdominal aortic aneurysm. Reproductive: Brachytherapy seeds are noted within the prostate. Other: None. Musculoskeletal: Postsurgical changes are noted in the proximal left femur. Degenerative changes of the lumbar spine are seen. IMPRESSION: Circumferential thickening  in the ascending colon with some surrounding inflammatory changes. The wall thickening has increased in the interval from the prior exam. This raises suspicion for possible underlying neoplasm. Direct visualization is recommended. Soft tissue density adjacent to the hepatic flexure and tip of the liver. Given the patient's clinical history the possibility of local recurrence of previously resected renal carcinoma. Resolution of previously seen inflammatory changes extending to the right abdominal wall. Changes of prior nephrectomy. No other acute abnormality is seen. Electronically Signed   By: Inez Catalina M.D.   On: 11/01/2015 19:39  Dg Chest Portable 1 View  Result Date: 11/01/2015 CLINICAL DATA:  Fever and chills EXAM: PORTABLE CHEST 1 VIEW COMPARISON:  05/11/2015 FINDINGS: Chronic elevation of the right diaphragm. Normal heart size and mediastinal contours status post CABG. There is no edema, consolidation, effusion, or pneumothorax. IMPRESSION: Negative for pneumonia. Electronically Signed   By: Monte Fantasia M.D.   On: 11/01/2015 19:10    CBC  Recent Labs Lab 11/02/15 0436  WBC 5.0  HGB 8.2*  HCT 25.4*  PLT 59*  MCV 106.3*  MCH 34.3*  MCHC 32.3  RDW 14.3    Chemistries   Recent Labs Lab 11/01/15 1812 11/02/15 0436  NA 144 145  K 3.6 3.7  CL 103 106  CO2 27 28  GLUCOSE 131* 132*  BUN 86* 90*  CREATININE 6.99* 7.68*  CALCIUM 9.6 9.3   ------------------------------------------------------------------------------------------------------------------ estimated creatinine clearance is 11.6 mL/min (by C-G formula based on SCr of 7.68 mg/dL). ------------------------------------------------------------------------------------------------------------------ No results for input(s): HGBA1C in the last 72 hours. ------------------------------------------------------------------------------------------------------------------ No results for input(s): CHOL, HDL, LDLCALC, TRIG,  CHOLHDL, LDLDIRECT in the last 72 hours. ------------------------------------------------------------------------------------------------------------------ No results for input(s): TSH, T4TOTAL, T3FREE, THYROIDAB in the last 72 hours.  Invalid input(s): FREET3 ------------------------------------------------------------------------------------------------------------------ No results for input(s): VITAMINB12, FOLATE, FERRITIN, TIBC, IRON, RETICCTPCT in the last 72 hours.  Coagulation profile  Recent Labs Lab 11/01/15 2200  INR 1.12    No results for input(s): DDIMER in the last 72 hours.  Cardiac Enzymes No results for input(s): CKMB, TROPONINI, MYOGLOBIN in the last 168 hours.  Invalid input(s): CK ------------------------------------------------------------------------------------------------------------------ Invalid input(s): POCBNP   CBG:  Recent Labs Lab 11/01/15 2237 11/01/15 2326 11/02/15 0806  GLUCAP 167* 156* 97       Studies: Ct Abdomen Pelvis Wo Contrast  Result Date: 11/01/2015 CLINICAL DATA:  Fever and chills EXAM: CT ABDOMEN AND PELVIS WITHOUT CONTRAST TECHNIQUE: Multidetector CT imaging of the abdomen and pelvis was performed following the standard protocol without IV contrast. COMPARISON:  05/11/2015 FINDINGS: Lower chest:  Minimal right basilar atelectasis is noted. Hepatobiliary: Gallbladder has been surgically removed. The liver is within normal limits Pancreas: No mass or inflammatory process identified on this un-enhanced exam. Spleen: Within normal limits in size. Adrenals/Urinary Tract: The adrenal  glands are within normal limits. Changes of prior right nephrectomy are noted. Renal cystic changes are noted on the left stable from the prior study. Stomach/Bowel: Prominent fecal material is noted within the rectum which may represent a degree of impaction. No obstructive changes are seen. Inflammatory changes are again noted in the region of the  ascending colon. Adjacent to the hepatic flexure and tip of the liver there is a 3.6 cm mildly enhancing soft tissue lesion which appears somewhat larger than that seen on the prior exam. This may represent sequelae from the prior infection The previously seen changes extending through the abdominal wall on the right have resolved. The appendix is within normal limits. Vascular/Lymphatic: No pathologically enlarged lymph nodes. No evidence of abdominal aortic aneurysm. Reproductive: Brachytherapy seeds are noted within the prostate. Other: None. Musculoskeletal: Postsurgical changes are noted in the proximal left femur. Degenerative changes of the lumbar spine are seen. IMPRESSION: Circumferential thickening in the ascending colon with some surrounding inflammatory changes. The wall thickening has increased in the interval from the prior exam. This raises suspicion for possible underlying neoplasm. Direct visualization is recommended. Soft tissue density adjacent to the hepatic flexure and tip of the liver. Given the patient's clinical history the possibility of local recurrence of previously resected renal carcinoma. Resolution of previously seen inflammatory changes extending to the right abdominal wall. Changes of prior nephrectomy. No other acute abnormality is seen. Electronically Signed   By: Inez Catalina M.D.   On: 11/01/2015 19:39  Dg Chest Portable 1 View  Result Date: 11/01/2015 CLINICAL DATA:  Fever and chills EXAM: PORTABLE CHEST 1 VIEW COMPARISON:  05/11/2015 FINDINGS: Chronic elevation of the right diaphragm. Normal heart size and mediastinal contours status post CABG. There is no edema, consolidation, effusion, or pneumothorax. IMPRESSION: Negative for pneumonia. Electronically Signed   By: Monte Fantasia M.D.   On: 11/01/2015 19:10     Lab Results  Component Value Date   HGBA1C 5.2 02/13/2015   HGBA1C 5.6 07/28/2014   HGBA1C 6.0 (H) 09/28/2011   Lab Results  Component Value Date    LDLCALC 32 09/28/2011   CREATININE 7.68 (H) 11/02/2015       Scheduled Meds: . acidophilus  1 capsule Oral Daily  . aspirin EC  81 mg Oral QHS  . atorvastatin  10 mg Oral QHS  . calcitRIOL  0.25 mcg Oral Once per day on Mon Wed Fri  . cinacalcet  30 mg Oral Q supper  . heparin  5,000 Units Subcutaneous Q8H  . insulin aspart  0-5 Units Subcutaneous QHS  . insulin aspart  0-9 Units Subcutaneous TID WC  . midodrine  10 mg Oral BID WC  . multivitamin  1 tablet Oral QHS  . piperacillin-tazobactam (ZOSYN)  IV  2.25 g Intravenous Q8H  . sevelamer carbonate  4,000 mg Oral TID WC  . sodium chloride flush  3 mL Intravenous Q12H   Continuous Infusions:    LOS: 0 days    Time spent: >30 MINS    [facility]  [facility] Pager [phone]. If 7PM-7AM, please contact night-coverage at [email or web address], password [facility] 11/02/2015, 8:30 AM  LOS: 0 days

## 2015-11-02 NOTE — Progress Notes (Signed)
Patient and family complaining  And they want a diet  For him. Wanting the Drs. To be called again for this I sent a  text message to Dr. Allyson Sabal

## 2015-11-02 NOTE — Progress Notes (Signed)
Pharmacy Antibiotic Note [name] is a 65 y.o. male admitted on 11/01/2015 with IAA.  Pharmacy has been consulted for Zosyn and vancomycin dosing.  Zosyn 3.375 grams IV x 1 and vancomycin 1500 mg x 1 in the ED.   Blood cultures positive for GNR  Plan: 1. Begin Zosyn 2.25 grams IV every 8 hours given over 30 minutes  2. Consider stopping Vancomycin?    Height: 6\' 4"  (193 cm) Weight: 203 lb 14.8 oz (92.5 kg) IBW/kg (Calculated) : 86.8  Temp (24hrs), Avg:99.9 F (37.7 C), Min:98.6 F (37 C), Max:103.3 F (39.6 C)     Allergies  Allergen Reactions  . Codeine Other (See Comments)    Makes patient incoherent. STATES MAKES HIM COMATOSE  . Tape Other (See Comments)    Plastic tape tears skin off, please use paper tape instead.    Antimicrobials this admission: 7/30 Zosyn >>  7/30 Vancomycin >>  Microbiology results: 7/30 BC x 2 > GNR  Thank you Anette Guarneri, PharmD [phone] 11/02/2015, 10:33 AM

## 2015-11-02 NOTE — Evaluation (Signed)
Physical Therapy Evaluation Patient Details Name: [name] MRN: [id number] DOB: 11/23/1950 Today's Date: 11/02/2015   History of Present Illness   65 y.o. male ESRD MWF HD AT [place]  ( bilateral blindness),admited to Observation  With fever  103 / abd pain / with ho  kidney cancer status post right renal resection      Clinical Impression  Pt admitted with above diagnosis. Pt currently with functional limitations due to the deficits listed below (see PT Problem List). Pt stated feeling much better than yesterday upon initial entry into room.  Pt was unable to move modified independently for bed mobility and required mod assist. After power up with max assist, stool was found within sacral area. Pt required to sit due to fatigue before sacral area could be cleaned. Required light mod assist +2 second time to power up and stand EOB in order to be cleaned. Although pt stated a prior ambulation level of 500 steps in home, pt was deconditioned and able to only ambulate from EOB to chair with min assist +2 and rollator. Stated prior isometric exercise routines which can be implemented in further treatment sessions in order to increase strength, endurance, and balance.  Required constant verbal and noise cueing to guide direction of pt.  Pt will benefit from skilled PT to increase their independence and safety with mobility to allow discharge to the venue listed below.       Follow Up Recommendations Home health PT  Spouse was in room and stated negative past experiences in SNF and opted for Home Health PT    Equipment Recommendations       Recommendations for Other Services OT consult     Precautions / Restrictions Precautions Precautions:  (bilateral blindness) Restrictions Weight Bearing Restrictions: No      Mobility  Bed Mobility Overal bed mobility: Needs Assistance Bed Mobility: Rolling;Sidelying to Sit Rolling: Min assist Sidelying to sit: Mod assist;HOB elevated        General bed mobility comments: required cueing for hand placement on EOB rails with initial bed mobility modified indepdent but required mod assist to go from side lying to seated EOB,  Transfers Overall transfer level: Needs assistance Equipment used: Rolling walker (2 wheeled) (rollator) Transfers: Sit to/from Stand Sit to Stand: Max assist;Mod assist;+2 physical assistance         General transfer comment: required max assist to power up from EOB. Sat back down due to fatigue EOB and required light mod assist +2 to power up second time from elevated surface to approximate bed height at home  Ambulation/Gait Ambulation/Gait assistance: Min assist;+2 physical assistance Ambulation Distance (Feet): 5 Feet, Pivotal steps bed to recliner Assistive device: Rolling walker (2 wheeled) (rollator) Gait Pattern/deviations: Step-to pattern;Decreased step length - right;Decreased step length - left;Shuffle; trunk flexed        Stairs            Wheelchair Mobility    Modified Rankin (Stroke Patients Only)       Balance Overall balance assessment: Needs assistance Sitting-balance support: Bilateral upper extremity supported;Feet supported Sitting balance-Leahy Scale: Poor     Standing balance support: Bilateral upper extremity supported Standing balance-Leahy Scale: Poor                               Pertinent Vitals/Pain Pain Assessment: No/denies pain    Home Living Family/patient expects to be discharged to:: Private residence Living Arrangements: Spouse/significant  other   Type of Home: House Home Access: Ramped entrance     Home Layout: One level Home Equipment: Clinical cytogeneticist - 4 wheels;Bedside commode;Wheelchair - manual;Grab bars - toilet      Prior Function Level of Independence: Needs assistance   Gait / Transfers Assistance Needed: Rollator all the time  ADL's / Homemaking Assistance Needed: Wife assists with setting water  temperature, washing back, and LB dressing.         Hand Dominance   Dominant Hand: Left    Extremity/Trunk Assessment   Upper Extremity Assessment: Generalized weakness (Bilateral shoulder weakness)           Lower Extremity Assessment: Generalized weakness; R transmetatarsal amputation      Cervical / Trunk Assessment: Kyphotic  Communication   Communication: No difficulties  Cognition Arousal/Alertness: Awake/alert Behavior During Therapy: WFL for tasks assessed/performed                        General Comments General comments (skin integrity, edema, etc.): potential developing sacral wound, continued to be monitored    Exercises        Assessment/Plan    PT Assessment Patient needs continued PT services  PT Diagnosis Difficulty walking;Generalized weakness   PT Problem List Decreased activity tolerance;Decreased balance;Decreased mobility  PT Treatment Interventions Gait training;Functional mobility training;Therapeutic activities;Therapeutic exercise;Balance training;Patient/family education   PT Goals (Current goals can be found in the Care Plan section) Acute Rehab PT Goals Patient Stated Goal: Home PT Goal Formulation: With patient/family Time For Goal Achievement: 11/09/15 Potential to Achieve Goals: Good    Frequency Min 3X/week   Barriers to discharge        Co-evaluation               End of Session Equipment Utilized During Treatment: Gait belt Activity Tolerance: Patient limited by fatigue Patient left: in bed;with family/visitor present           Time: [id number] PT Time Calculation (min) (ACUTE ONLY): 33 min   Charges:    November 16, 2015 1000  PT General Charges  $$ ACUTE PT VISIT 1 Procedure  PT Evaluation  $PT Eval Moderate Complexity 1 Procedure  PT Treatments  $Therapeutic Activity 8-22 mins          PT G Codes:    11/16/15 1000  PT G-Codes **NOT FOR INPATIENT CLASS**  Functional Assessment Tool Used  Clinical Judgement  Functional Limitation Mobility: Walking and moving around  Mobility: Walking and Moving Around Current Status [id number]) CJ  Mobility: Walking and Moving Around Goal Status [id number]) [place], SPT Acute Rehabilitation Services  Crissie Sickles Nov 16, 2015, 10:58 AM   I was present during the PT session and agree with patient status and findings as outlined by Mitzi Hansen, SPT.  Roney Marion, [place]  Acute Rehabilitation Services Pager [phone] Office [phone]

## 2015-11-02 NOTE — Progress Notes (Signed)
OT Cancellation    11/02/15 1300  OT Visit Information  Last OT Received On 11/02/15  Reason Eval/Treat Not Completed Patient at procedure or test/ unavailable (in HD. Will assess tomorrow.)  Maurie Boettcher, OTR/L  [phone] 11/02/2015

## 2015-11-02 NOTE — Consult Note (Signed)
[name] Renal Consultation Note  HPI: [name] is a 65 y.o. male ESRD MWF HD AT [place]  ( bilateral blindness),admited to Observation  With fever  103 / abd pain / with ho  kidney cancer status post right renal resection  At [facility]  (complicated by postop infection with nephrostomy tube that was pulled 6 weeks ago), prostate cancer, hypotension( Last week started on midodrine AT  [facility]. Center for low bp  ). We are consulted for HD  With orders written . Will do Full consult Note if changed to Admit  status.   Dialysis Orders: Center: [place]  on MWF . EDW 91 kg (but leaving below 90.3, 90.0, 90.1 recently) HD Bath 2k, 2ca  Time 4hr 73min   (Dr. Karleen Hampshire. Incr past week sec. decr urr  With need new access  Pt wants time ^ First) Heparin 8000. Access R FA AVF BFR 450  DFR 800    With 200 Dialyzer    PO Calcitriol 0.5 mcg  q /HD  Mircera 50 mcg po q 2 weeks  Last on 10/21/15     Other  Op labs hgb 10.0 / 7/26  Cor ca 10 phos 6.3  pth [place], PA-C [place] [phone] 11/02/2015, 7:55 AM   Patient seen on HD and examined, agree with above note with above modifications. He says he feels alright now. Placed on broad spectrum antibiotics for presumed urinary tract infection.  BFR 400 on HD with good BP- no changes Corliss Parish, MD 11/02/2015

## 2015-11-02 NOTE — Progress Notes (Signed)
PHARMACY - PHYSICIAN COMMUNICATION CRITICAL VALUE ALERT - BLOOD CULTURE IDENTIFICATION (BCID)  Results for orders placed or performed during the hospital encounter of 11/01/15  Blood Culture ID Panel (Reflexed) (Collected: 11/01/2015  6:12 PM)  Result Value Ref Range   Enterococcus species NOT DETECTED NOT DETECTED   Vancomycin resistance NOT DETECTED NOT DETECTED   Listeria monocytogenes NOT DETECTED NOT DETECTED   Staphylococcus species NOT DETECTED NOT DETECTED   Staphylococcus aureus NOT DETECTED NOT DETECTED   Methicillin resistance NOT DETECTED NOT DETECTED   Streptococcus species NOT DETECTED NOT DETECTED   Streptococcus agalactiae NOT DETECTED NOT DETECTED   Streptococcus pneumoniae NOT DETECTED NOT DETECTED   Streptococcus pyogenes NOT DETECTED NOT DETECTED   Acinetobacter baumannii NOT DETECTED NOT DETECTED   Enterobacteriaceae species DETECTED (A) NOT DETECTED   Enterobacter cloacae complex NOT DETECTED NOT DETECTED   Escherichia coli DETECTED (A) NOT DETECTED   Klebsiella oxytoca NOT DETECTED NOT DETECTED   Klebsiella pneumoniae NOT DETECTED NOT DETECTED   Proteus species NOT DETECTED NOT DETECTED   Serratia marcescens NOT DETECTED NOT DETECTED   Carbapenem resistance NOT DETECTED NOT DETECTED   Haemophilus influenzae NOT DETECTED NOT DETECTED   Neisseria meningitidis NOT DETECTED NOT DETECTED   Pseudomonas aeruginosa NOT DETECTED NOT DETECTED   Candida albicans NOT DETECTED NOT DETECTED   Candida glabrata NOT DETECTED NOT DETECTED   Candida krusei NOT DETECTED NOT DETECTED   Candida parapsilosis NOT DETECTED NOT DETECTED   Candida tropicalis NOT DETECTED NOT DETECTED   Complicated case - he developed an abscess after kidney resection in April 2017 that required a nephrostomy tube and a prolonged course of IV antibiotics.  On review in Care Everywhere it appears the abscess was polymicrobial and some Gram positive and anaerobes were isolated at different times.  Now he  is admitted with UTI and Gram negative bacteremia. BCID detects E coli. However with his complicated history and possible polymicrobial infection the plan is to continue broader spectrum antibiotics for now.  He is currently on Vancomycin and Zosyn.  Name of physician (or Provider) Contacted: Dr. Allyson Sabal via text page  Changes to prescribed antibiotics required: Continue Vancomycin and Zosyn today per Dr. Ledell Peoples note.  Consider stopping Vancomycin on 8/1 if cultures remain negative for Gram positive organisms.  Norva Riffle 11/02/2015  12:11 PM

## 2015-11-02 NOTE — Consult Note (Signed)
Referring Provider: No ref. provider found Primary Care Physician:  [name], MD Primary Gastroenterologist:  Dr. Deatra Ina   Reason for Consultation:  Ascending colon thickening on CT abdomen/pelvis.  HPI: [name] is a 65 y.o. male with a history of renal cell carcinoma. Previous RFA ablation on right, cryoablation on left. S/p right nephrectomy for suspected recurrence in 01/2015, pathology negative for cancer. Post-operative course c/b recurrent abscesses between the nephrectomy and abdominal wall. S/p RLQ drain 07/2015 and treated with Vanc/ Cefepime/ Flagyl. Drain removed in 09/03/15. ESRD on dialysis, Prostate cancer s/p brachytherapy, bilateral blindness, CAD, HTN, chronic anemia,DM, and thrombocytopenia.  CT abdomen/pelvis 05/11/2015: Complex process posterior but separate from right colon, favor a complex infectious or necrotic process  Secondarily affecting the right colon and duodenum. Colonoscopy 05/13/14 by Dr. Deatra Ina for adenoma surveillance (2010, 2013, 2014) 1. Two sessile polyps ranging from 3 to 59mm in size were found in the ascending colon; polypectomy was performed with a cold snare 2. Sessile polyp was found in the descending colon; polypectomy was performed using snare cautery 3. Mild diverticulosis was noted in the sigmoid colon, plan colonoscopy 3 years Pathology tubular adenomas without HGD  History of  2 weeks of foul smelling diarrhea, not described as bloody or watery resolving 1 week ago. Presented to the hospital yesterday with fever, chills, and abdominal pain. The pain was mild and on the right side of the abdomen. Associated 3 episodes of vomiting prior to admission and denies accompanied nausea. Complained of fever and chills with a Tmax of 103.0. Also denies changes in bowel movements, weight loss, hematochezia, weakness, or fatigue. His abdominal pain is better today after being treated with Vacomycin and Zosyn since admission.   CT abdomen/pelvis  11/01/15: Prominent fecal material is noted within the rectum, question impaction. Inflammatory changes are again noted in the region of the ascending colon, increased compared to 05/2015, raising suspicion for neoplasm. (? local recurrent renal cell carcinoma)  Slightly increased soft tissue lesion in region of hepatic flexure.   Labs: WBC normal. Hemoglobin 8.2. MVC 106 Platelets 59 (121 in 05/12/15) LFT are not elevated and albumin is low Lactic acid within normal limits  IMPRESSION:  * Sepsis. Symptoms have improved while on Zosyn and Vancomycin. Question source. Blood and urine cultures pending.  *Ascending colon thickening. R/O colitis (inflammtory vs infection vs Ischemic). Having right sided pain, but no other clinical signs/SXS of colitis. Colon findings may represent recurrent RCC.  *Soft tissue density adjacent to the hepatic flexure and tip of the liver: LFTs are not elevated.  *Chronic Anemia secondary to ESRD disease. [name] with dialysis. However, MCV is elevated suggesting a B-12 or folate deficiency.  *Hx of recurrent adenomatous colon polyp: last colonoscopy 05/13/14  *Hypoalbuminemia: given that LFTs are not elevated albumin is likely low do to malnutrition.  *Hx of RCC: S/P left cryoablation and right RFA. Right nephrectomy for suspected recurrence in 01/2015, pathology negative for cancer.  Post op course complicated by abscess requiring drainage.   *Prostate cancer S/P brachytherapy   *Thrombocytopenia: Acute on chronic.Splenomegaly noted on prior CT.   PLAN: 1. Consider repeat colonoscopy. Will discuss with Dr. Havery Moros. 2. Will order B-12 and folate levels  3. Will order a prealbumin to assess malnutrition      Past Medical History:  Diagnosis Date  . Anemia   . Arthritis    "back, neck" (07/28/2014)  . Blind    both eyes removed   . Bruises easily   .  CAD, NATIVE VESSEL 01/08/2008      . Cellulitis late 1980's   "hospitalized; wrapped  both legs; several times; no OR for this"  . Colon polyps   . DM type 2 (diabetes mellitus, type 2) ([place])    no medications (07/28/2014)  . ESRD (end stage renal disease) on dialysis [facility])    [name]; Mon, Wed, Fri (07/28/2014)  . Family history of breast cancer    mother  . HYPERLIPIDEMIA-MIXED 01/08/2008  . HYPERTENSION 01/27/2009   BP low  . Hypotension   . Kidney stones   . Low blood pressure   . OVERWEIGHT/OBESITY 01/08/2008   Lost 205 lbs through diet and exercise.    . Peripheral vascular disease ([place])   . Pneumonia 2000;s X 1  . Prostate cancer ([place])   . Prosthetic eye globe    both eyes  . Renal cell carcinoma 2001 and 2003   "both kidneys"  . Renal insufficiency     Past Surgical History:  Procedure Laterality Date  . AV FISTULA PLACEMENT Left 02/2010   LFA  . CHOLECYSTECTOMY OPEN  1992  . COLONOSCOPY  06/14/2011   Procedure: COLONOSCOPY;  Surgeon: [name], MD;  Location: [facility];  Service: Endoscopy;  Laterality: N/A;  . COLONOSCOPY N/A 07/24/2012   Procedure: COLONOSCOPY;  Surgeon: [name], MD;  Location: [facility];  Service: Endoscopy;  Laterality: N/A;  . COLONOSCOPY    . COLONOSCOPY WITH PROPOFOL N/A 05/13/2014   Procedure: COLONOSCOPY WITH PROPOFOL;  Surgeon: [name], MD;  Location: [facility];  Service: Endoscopy;  Laterality: N/A;  . CORONARY ARTERY BYPASS GRAFT  09/29/2011   Procedure: CORONARY ARTERY BYPASS GRAFTING (CABG);  Surgeon: [name], MD;  Location: [facility];  Service: Open Heart Surgery;  Laterality: N/A;  Coronary Artery Bypass Graft times two utilizing the left internal mammary artery and the right greater saphenous vein harvested endoscopically.  . CYSTOSCOPY WITH RETROGRADE PYELOGRAM, URETEROSCOPY AND STENT PLACEMENT Left 07/28/2014   Procedure: CYSTOSCOPY WITH RETROGRADE PYELOGRAM, URETERAL BALLOON DILITATION, URETEROSCOPY AND LEFT STENT PLACEMENT;  Surgeon: [name], MD;  Location: [facility];  Service:  Urology;  Laterality: Left;  . ENUCLEATION  2003; 2006   bilateral; "diabetes; pain"  . FOOT AMPUTATION Right ~ 2002   right;  partial; "infection"  . FRACTURE SURGERY    . HIP PINNING,CANNULATED Left 12/31/2013   Procedure: CANNULATED HIP PINNING;  Surgeon: Renette Butters, MD;  Location: [facility];  Service: Orthopedics;  Laterality: Left;  Carm, FX Table, [employer]  . HOT HEMOSTASIS  06/14/2011   Procedure: HOT HEMOSTASIS (ARGON PLASMA COAGULATION/BICAP);  Surgeon: [name], MD;  Location[facility];  Service: Endoscopy;  Laterality: N/A;  . INSERTION PROSTATE RADIATION SEED  03/2012  . LEFT HEART CATHETERIZATION WITH CORONARY ANGIOGRAM N/A 09/27/2011   Procedure: LEFT HEART CATHETERIZATION WITH CORONARY ANGIOGRAM;  Surgeon: [name], MD;  Location: [facility] LAB;  Service: Cardiovascular;  Laterality: N/A;  . LIGATION OF COMPETING BRANCHES OF ARTERIOVENOUS FISTULA Left 07/29/2014   Procedure: LIGATION OF COMPETING BRANCHES OF LEFT ARM ARTERIOVENOUS FISTULA;  Surgeon: [name], MD;  Location: [facility];  Service: Vascular;  Laterality: Left;  . NEPHRECTOMY Right 01/30/2015   done at [place]  . RADIOFREQUENCY ABLATION KIDNEY  2010-2012   "twice; one on each side; for cancer"  . REVISON OF ARTERIOVENOUS FISTULA Left 09/01/2015   Procedure: EXPLORATION LEFT LOWER ARM  RADIOCEPHALIC ARTERIOVENOUS FISTULA;  Surgeon: Conrad Rawson,  MD;  Location: [facility];  Service: Vascular;  Laterality: Left;  [name] VARICOSE VEIN SURGERY  mid 1980's    BLE; "knees down; both legs; 2 separate times"    Prior to Admission medications   Medication Sig Start Date End Date Taking? Authorizing Provider  aspirin EC 81 MG tablet Take 81 mg by mouth at bedtime.   Yes Historical Provider, MD  atorvastatin (LIPITOR) 10 MG tablet TAKE 1 TABLET BY MOUTH EVERY DAY Patient taking differently: TAKE 1 TABLET BY MOUTH DAILY AT BEDTIME 05/13/14  Yes Thayer Headings, MD  cinacalcet (SENSIPAR) 30 MG tablet Take 30 mg by  mouth daily with supper.   Yes Historical Provider, MD  loperamide (IMODIUM A-D) 2 MG tablet Take 2 mg by mouth 4 (four) times daily as needed for diarrhea or loose stools.   Yes Historical Provider, MD  midodrine (PROAMATINE) 10 MG tablet Take 10 mg by mouth See admin instructions. Take 1 tablet (10 mg) by mouth every morning and 1 tablet (10 mg) before lunch   Yes Historical Provider, MD  multivitamin (RENA-VIT) TABS tablet Take 1 tablet by mouth at bedtime.   Yes Historical Provider, MD  Probiotic Product (ALIGN) 4 MG CAPS Take 1 capsule by mouth daily.   Yes Historical Provider, MD  sevelamer carbonate (RENVELA) 800 MG tablet Take 2,400-4,000 mg by mouth See admin instructions. Take 5 tablets (4000 mg) by mouth 3 times daily with meals and 3 tablets (2400 mg) 2 times daily with snacks 06/26/14  Yes Historical Provider, MD  acetaminophen (TYLENOL) 325 MG tablet Take 1-2 tablets (325-650 mg total) by mouth every 6 (six) hours as needed for moderate pain. Patient not taking: Reported on 11/01/2015 09/01/15   Hulen Shouts Rhyne, PA-C  calcitRIOL (ROCALTROL) 0.25 MCG capsule Take 0.25 mcg by mouth 3 (three) times a week. Reported on 08/26/2015    Historical Provider, MD  heparin 5000 UNIT/ML injection Inject 8,000 Units into the skin 3 (three) times a week. Monday, Wednesday, and Friday.    Historical Provider, MD  Methoxy PEG-Epoetin Beta (MIRCERA) 50 MCG/0.3ML SOSY Inject 225 mcg as directed every 14 (fourteen) days.     Historical Provider, MD    Current Facility-Administered Medications  Medication Dose Route Frequency Provider Last Rate Last Dose  . acetaminophen (TYLENOL) tablet 650 mg  650 mg Oral Q6H PRN Ivor Costa, MD       Or  . acetaminophen (TYLENOL) suppository 650 mg  650 mg Rectal Q6H PRN Ivor Costa, MD      . acidophilus (RISAQUAD) capsule 1 capsule  1 capsule Oral Daily Ivor Costa, MD   1 capsule at 11/01/15 2342  . aspirin EC tablet 81 mg  81 mg Oral QHS Ivor Costa, MD   81 mg at 11/01/15  2343  . atorvastatin (LIPITOR) tablet 10 mg  10 mg Oral QHS Ivor Costa, MD   10 mg at 11/01/15 2342  . calcitRIOL (ROCALTROL) capsule 0.5 mcg  0.5 mcg Oral Q M,W,F-HD Ernest Haber, PA-C      . cinacalcet (SENSIPAR) tablet 30 mg  30 mg Oral Q supper Ivor Costa, MD      . Derrill Memo ON 11/04/2015] Darbepoetin Alfa (ARANESP) injection 100 mcg  100 mcg Intravenous Q Wed-HD Ernest Haber, PA-C      . hydrOXYzine (VISTARIL) injection 25 mg  25 mg Intramuscular Q6H PRN Ivor Costa, MD      . insulin aspart (novoLOG) injection 0-5 Units  0-5 Units Subcutaneous QHS Ivor Costa,  MD      . insulin aspart (novoLOG) injection 0-9 Units  0-9 Units Subcutaneous TID WC Ivor Costa, MD      . midodrine (PROAMATINE) tablet 10 mg  10 mg Oral BID WC Reyne Dumas, MD   10 mg at 11/02/15 0749  . morphine 2 MG/ML injection 2 mg  2 mg Intravenous Q4H PRN Ivor Costa, MD      . multivitamin (RENA-VIT) tablet 1 tablet  1 tablet Oral QHS Ivor Costa, MD   1 tablet at 11/01/15 2342  . piperacillin-tazobactam (ZOSYN) IVPB 2.25 g  2.25 g Intravenous Q8H Deno Etienne, DO 100 mL/hr at 11/02/15 0635 2.25 g at 11/02/15 0635  . sevelamer carbonate (RENVELA) tablet 2,400 mg  2,400 mg Oral PRN Ivor Costa, MD      . sevelamer carbonate (RENVELA) tablet 4,000 mg  4,000 mg Oral TID WC Ivor Costa, MD      . sodium chloride flush (NS) 0.9 % injection 3 mL  3 mL Intravenous Q12H Ivor Costa, MD       Facility-Administered Medications Ordered in Other Encounters  Medication Dose Route Frequency Provider Last Rate Last Dose  . Chlorhexidine Gluconate Cloth 2 % PADS 6 each  6 each Topical Once [name], MD        Allergies as of 11/01/2015 - Review Complete 11/01/2015  Allergen Reaction Noted  . Codeine Other (See Comments)   . Tape Other (See Comments) 09/22/2011    Family History  Problem Relation Age of Onset  . Cancer Mother 22    breast, spine and ovarian  . Heart disease Mother   . Hyperlipidemia Mother   . Hypertension Mother   .  Varicose Veins Mother   . Emphysema Father   . Cancer Father 64    kidney, prostate  . Heart disease Father   . Hyperlipidemia Father   . Hypertension Father   . Cancer Sister     ovarian  . Cancer Brother     lung  . Heart disease Brother   . Hyperlipidemia Brother   . Hypertension Brother   . Heart attack Brother   . Peripheral vascular disease Brother   . Cancer    . Coronary artery disease    . Kidney disease    . Breast cancer    . Ovarian cancer    . Lung cancer    . Diabetes    . Malignant hyperthermia Neg Hx     Social History   Social History  . Marital status: Married    Spouse name: N/A  . Number of children: N/A  . Years of education: N/A   Occupational History  . Not on file.   Social History Main Topics  . Smoking status: Never Smoker  . Smokeless tobacco: Never Used  . Alcohol use No  . Drug use: No  . Sexual activity: No   Other Topics Concern  . Not on file   Social History Narrative  . No narrative on file    Review of Systems: Gen: fever, chills, sweats denies weight loss, weakness, or fatigue Resp: Denies dyspnea or cough GI: Denies jaundice, and fecal incontinence. Denies dysphagia or odynophagia. GU :  Oliguria Denies urinary burning, blood in urine MS: Denies joint pain, limitation of movement, and swelling, stiffness, low back pain, extremity pain. Denies muscle weakness, cramps, atrophy.  Derm: Denies rash, itching, dry skin, hives, moles, warts, or unhealing ulcers.  Psych: Denies depression, anxiety, memory loss, suicidal  ideation, hallucinations, paranoia, and confusion. Heme: Denies bruising, bleeding, and enlarged lymph nodes. Transfusion: Had multiple previous transfusion over the year all related to hospitalizations.  Neuro:  Denies any headaches, dizziness, paresthesias. Endo:  Denies any problems with DM, thyroid, adrenal function.  Physical Exam: Vital signs in last 24 hours: Temp:  [98.6 F (37 C)-103.3 F (39.6  C)] 99.2 F (37.3 C) (07/31 0808) Pulse Rate:  [66-80] 66 (07/31 0808) Resp:  [15-26] 17 (07/31 0808) BP: (52-128)/(40-67) 118/67 (07/31 0808) SpO2:  [95 %-100 %] 100 % (07/31 0808) Weight:  [200 lb (90.7 kg)-203 lb 14.8 oz (92.5 kg)] 203 lb 14.8 oz (92.5 kg) (07/30 2331) Last BM Date: 11/01/15 General:   Alert,  Well-developed, well-nourished, pleasant and cooperative in NAD, appears younger than age.    Head:  Normocephalic and atraumatic. Eyes:  Conjunctiva pink. Ears:  Normal auditory acuity. Nose:  No deformity, discharge,  or lesions. Mouth:  No deformity or lesions.   Neck:  Supple; no masses or thyromegaly. Lungs:  Clear throughout to auscultation.   No wheezes, crackles, or rhonchi. Heart:  Regular rate and rhythm; no murmurs, clicks, rubs,  or gallops. Abdomen:  Soft,nontender, BS active,nonpalp mass or hsm. Nephrectomy scar on right side, well healed. Rectal:  Deferred  Msk:  Symmetrical without gross deformities. . Pulses:  Normal pulses noted. Extremities:  Without clubbing or edema. Neurologic:  Alert and  oriented x4;  grossly normal neurologically. Skin:  Intact without significant lesions or rashes.. Psych:  Alert and cooperative. Normal mood and affect.  Intake/Output from previous day: 07/30 0701 - 07/31 0700 In: 1050 [I.V.:1000; IV Piggyback:50] Out: 200 [Urine:200] Intake/Output this shift: No intake/output data recorded.  Lab Results:  Recent Labs  11/02/15 0436  WBC 5.0  HGB 8.2*  HCT 25.4*  PLT 59*   BMET  Recent Labs  11/01/15 1812 11/02/15 0436  NA 144 145  K 3.6 3.7  CL 103 106  CO2 27 28  GLUCOSE 131* 132*  BUN 86* 90*  CREATININE 6.99* 7.68*  CALCIUM 9.6 9.3   LFT No results for input(s): PROT, ALBUMIN, AST, ALT, ALKPHOS, BILITOT, BILIDIR, IBILI in the last 72 hours. PT/INR  Recent Labs  11/01/15 2200  LABPROT 14.5  INR 1.12   Hepatitis Panel No results for input(s): HEPBSAG, HCVAB, HEPAIGM, HEPBIGM in the last 72  hours.    Studies/Results: Ct Abdomen Pelvis Wo Contrast  Result Date: 11/01/2015 CLINICAL DATA:  Fever and chills EXAM: CT ABDOMEN AND PELVIS WITHOUT CONTRAST TECHNIQUE: Multidetector CT imaging of the abdomen and pelvis was performed following the standard protocol without IV contrast. COMPARISON:  05/11/2015 FINDINGS: Lower chest:  Minimal right basilar atelectasis is noted. Hepatobiliary: Gallbladder has been surgically removed. The liver is within normal limits Pancreas: No mass or inflammatory process identified on this un-enhanced exam. Spleen: Within normal limits in size. Adrenals/Urinary Tract: The adrenal glands are within normal limits. Changes of prior right nephrectomy are noted. Renal cystic changes are noted on the left stable from the prior study. Stomach/Bowel: Prominent fecal material is noted within the rectum which may represent a degree of impaction. No obstructive changes are seen. Inflammatory changes are again noted in the region of the ascending colon. Adjacent to the hepatic flexure and tip of the liver there is a 3.6 cm mildly enhancing soft tissue lesion which appears somewhat larger than that seen on the prior exam. This may represent sequelae from the prior infection The previously seen changes extending through the abdominal  wall on the right have resolved. The appendix is within normal limits. Vascular/Lymphatic: No pathologically enlarged lymph nodes. No evidence of abdominal aortic aneurysm. Reproductive: Brachytherapy seeds are noted within the prostate. Other: None. Musculoskeletal: Postsurgical changes are noted in the proximal left femur. Degenerative changes of the lumbar spine are seen. IMPRESSION: Circumferential thickening in the ascending colon with some surrounding inflammatory changes. The wall thickening has increased in the interval from the prior exam. This raises suspicion for possible underlying neoplasm. Direct visualization is recommended. Soft tissue  density adjacent to the hepatic flexure and tip of the liver. Given the patient's clinical history the possibility of local recurrence of previously resected renal carcinoma. Resolution of previously seen inflammatory changes extending to the right abdominal wall. Changes of prior nephrectomy. No other acute abnormality is seen. Electronically Signed   By: Inez Catalina M.D.   On: 11/01/2015 19:39  Dg Chest Portable 1 View  Result Date: 11/01/2015 CLINICAL DATA:  Fever and chills EXAM: PORTABLE CHEST 1 VIEW COMPARISON:  05/11/2015 FINDINGS: Chronic elevation of the right diaphragm. Normal heart size and mediastinal contours status post CABG. There is no edema, consolidation, effusion, or pneumothorax. IMPRESSION: Negative for pneumonia. Electronically Signed   By: Monte Fantasia M.D.   On: 11/01/2015 19:10     Grayland Ormond PA-S  11/02/2015, 9:05 AM

## 2015-11-03 ENCOUNTER — Encounter (HOSPITAL_COMMUNITY): Payer: Self-pay | Admitting: *Deleted

## 2015-11-03 ENCOUNTER — Observation Stay (HOSPITAL_COMMUNITY): Payer: Medicare Other | Admitting: Certified Registered Nurse Anesthetist

## 2015-11-03 ENCOUNTER — Encounter (HOSPITAL_COMMUNITY)
Admission: EM | Disposition: A | Discharge status: Home Health | Payer: Self-pay | Source: Home / Self Care | Attending: Internal Medicine

## 2015-11-03 DIAGNOSIS — Z7982 Long term (current) use of aspirin: Secondary | ICD-10-CM | POA: Diagnosis not present

## 2015-11-03 DIAGNOSIS — E1122 Type 2 diabetes mellitus with diabetic chronic kidney disease: Secondary | ICD-10-CM | POA: Diagnosis present

## 2015-11-03 DIAGNOSIS — Z951 Presence of aortocoronary bypass graft: Secondary | ICD-10-CM | POA: Diagnosis not present

## 2015-11-03 DIAGNOSIS — Z85528 Personal history of other malignant neoplasm of kidney: Secondary | ICD-10-CM | POA: Diagnosis not present

## 2015-11-03 DIAGNOSIS — Z905 Acquired absence of kidney: Secondary | ICD-10-CM | POA: Diagnosis not present

## 2015-11-03 DIAGNOSIS — E785 Hyperlipidemia, unspecified: Secondary | ICD-10-CM | POA: Diagnosis present

## 2015-11-03 DIAGNOSIS — H540 Blindness, both eyes: Secondary | ICD-10-CM | POA: Diagnosis present

## 2015-11-03 DIAGNOSIS — I120 Hypertensive chronic kidney disease with stage 5 chronic kidney disease or end stage renal disease: Secondary | ICD-10-CM | POA: Diagnosis present

## 2015-11-03 DIAGNOSIS — E1151 Type 2 diabetes mellitus with diabetic peripheral angiopathy without gangrene: Secondary | ICD-10-CM | POA: Diagnosis present

## 2015-11-03 DIAGNOSIS — E46 Unspecified protein-calorie malnutrition: Secondary | ICD-10-CM | POA: Diagnosis present

## 2015-11-03 DIAGNOSIS — R1031 Right lower quadrant pain: Secondary | ICD-10-CM | POA: Diagnosis not present

## 2015-11-03 DIAGNOSIS — R509 Fever, unspecified: Secondary | ICD-10-CM | POA: Diagnosis present

## 2015-11-03 DIAGNOSIS — A419 Sepsis, unspecified organism: Secondary | ICD-10-CM | POA: Diagnosis not present

## 2015-11-03 DIAGNOSIS — A4151 Sepsis due to Escherichia coli [E. coli]: Secondary | ICD-10-CM | POA: Diagnosis not present

## 2015-11-03 DIAGNOSIS — I2510 Atherosclerotic heart disease of native coronary artery without angina pectoris: Secondary | ICD-10-CM

## 2015-11-03 DIAGNOSIS — Z6825 Body mass index (BMI) 25.0-25.9, adult: Secondary | ICD-10-CM | POA: Diagnosis not present

## 2015-11-03 DIAGNOSIS — N186 End stage renal disease: Secondary | ICD-10-CM | POA: Diagnosis present

## 2015-11-03 DIAGNOSIS — Z79899 Other long term (current) drug therapy: Secondary | ICD-10-CM | POA: Diagnosis not present

## 2015-11-03 DIAGNOSIS — F329 Major depressive disorder, single episode, unspecified: Secondary | ICD-10-CM | POA: Diagnosis present

## 2015-11-03 DIAGNOSIS — Z8546 Personal history of malignant neoplasm of prostate: Secondary | ICD-10-CM | POA: Diagnosis not present

## 2015-11-03 DIAGNOSIS — K648 Other hemorrhoids: Secondary | ICD-10-CM | POA: Diagnosis present

## 2015-11-03 DIAGNOSIS — I959 Hypotension, unspecified: Secondary | ICD-10-CM | POA: Diagnosis present

## 2015-11-03 DIAGNOSIS — N390 Urinary tract infection, site not specified: Secondary | ICD-10-CM | POA: Diagnosis present

## 2015-11-03 DIAGNOSIS — D696 Thrombocytopenia, unspecified: Secondary | ICD-10-CM | POA: Diagnosis present

## 2015-11-03 DIAGNOSIS — Z992 Dependence on renal dialysis: Secondary | ICD-10-CM | POA: Diagnosis not present

## 2015-11-03 DIAGNOSIS — D631 Anemia in chronic kidney disease: Secondary | ICD-10-CM | POA: Diagnosis present

## 2015-11-03 DIAGNOSIS — A4150 Gram-negative sepsis, unspecified: Secondary | ICD-10-CM | POA: Diagnosis present

## 2015-11-03 DIAGNOSIS — R933 Abnormal findings on diagnostic imaging of other parts of digestive tract: Secondary | ICD-10-CM | POA: Diagnosis not present

## 2015-11-03 DIAGNOSIS — R7881 Bacteremia: Secondary | ICD-10-CM | POA: Diagnosis not present

## 2015-11-03 LAB — GLUCOSE, CAPILLARY
GLUCOSE-CAPILLARY: 64 mg/dL — AB (ref 65–99)
GLUCOSE-CAPILLARY: 102 mg/dL — AB (ref 65–99)
GLUCOSE-CAPILLARY: 140 mg/dL — AB (ref 65–99)
Glucose-Capillary: 72 mg/dL (ref 65–99)
Glucose-Capillary: 77 mg/dL (ref 65–99)

## 2015-11-03 LAB — COMPREHENSIVE METABOLIC PANEL
ALBUMIN: 2.8 g/dL — AB (ref 3.5–5.0)
ALK PHOS: 92 U/L (ref 38–126)
ALT: 27 U/L (ref 17–63)
AST: 39 U/L (ref 15–41)
Anion gap: 10 (ref 5–15)
BILIRUBIN TOTAL: 1.1 mg/dL (ref 0.3–1.2)
BUN: 39 mg/dL — AB (ref 6–20)
CALCIUM: 9.5 mg/dL (ref 8.9–10.3)
CO2: 30 mmol/L (ref 22–32)
Chloride: 100 mmol/L — ABNORMAL LOW (ref 101–111)
Creatinine, Ser: 4.78 mg/dL — ABNORMAL HIGH (ref 0.61–1.24)
GFR calc Af Amer: 13 mL/min — ABNORMAL LOW (ref >60)
GFR, EST NON AFRICAN AMERICAN: 12 mL/min — AB (ref >60)
GLUCOSE: 82 mg/dL (ref 65–99)
POTASSIUM: 4.1 mmol/L (ref 3.5–5.1)
Sodium: 140 mmol/L (ref 135–145)
TOTAL PROTEIN: 5.1 g/dL — AB (ref 6.5–8.1)

## 2015-11-03 LAB — PREALBUMIN: Prealbumin: 22.8 mg/dL (ref 18–38)

## 2015-11-03 LAB — VITAMIN B12: VITAMIN B 12: 912 pg/mL (ref 180–914)

## 2015-11-03 SURGERY — CANCELLED PROCEDURE

## 2015-11-03 MED ORDER — PEG-KCL-NACL-NASULF-NA ASC-C 100 G PO SOLR: 0.5000 | Freq: Once | ORAL | Status: DC

## 2015-11-03 MED ORDER — SODIUM CHLORIDE 0.9 % IV SOLN: INTRAVENOUS | Status: DC

## 2015-11-03 MED ORDER — DIATRIZOATE MEGLUMINE & SODIUM 66-10 % PO SOLN
ORAL | Status: AC
  Filled 2015-11-03: qty 30

## 2015-11-03 MED ORDER — SODIUM CHLORIDE 0.9 % IV BOLUS (SEPSIS): 250.0000 mL | Freq: Three times a day (TID) | INTRAVENOUS | Status: DC | PRN

## 2015-11-03 MED ORDER — MIDODRINE HCL 5 MG PO TABS
10.0000 mg | ORAL_TABLET | ORAL | Status: DC
  Administered 2015-11-03 – 2015-11-05 (×8): 10 mg via ORAL
  Filled 2015-11-03 (×6): qty 2

## 2015-11-03 MED ORDER — PEG-KCL-NACL-NASULF-NA ASC-C 100 G PO SOLR: 1.0000 | Freq: Once | ORAL | Status: DC

## 2015-11-03 MED ORDER — POLYETHYLENE GLYCOL 3350 17 G PO PACK
17.0000 g | PACK | Freq: Every day | ORAL | Status: DC
  Filled 2015-11-03: qty 1

## 2015-11-03 MED ORDER — PEG-KCL-NACL-NASULF-NA ASC-C 100 G PO SOLR
0.5000 | Freq: Once | ORAL | Status: AC
  Administered 2015-11-04: 100 g via ORAL
  Filled 2015-11-03: qty 1

## 2015-11-03 MED ORDER — METOCLOPRAMIDE HCL 5 MG/ML IJ SOLN
5.0000 mg | Freq: Four times a day (QID) | INTRAMUSCULAR | Status: AC | PRN
  Filled 2015-11-03: qty 2

## 2015-11-03 MED ORDER — DEXTROSE 50 % IV SOLN
INTRAVENOUS | Status: AC
  Administered 2015-11-03: 25 mL
  Filled 2015-11-03: qty 50

## 2015-11-03 MED ORDER — PEG-KCL-NACL-NASULF-NA ASC-C 100 G PO SOLR
0.5000 | Freq: Once | ORAL | Status: AC
  Administered 2015-11-03: 100 g via ORAL
  Filled 2015-11-03: qty 1

## 2015-11-03 MED ORDER — PEG-KCL-NACL-NASULF-NA ASC-C 100 G PO SOLR
0.5000 | Freq: Once | ORAL | Status: DC
  Filled 2015-11-03: qty 1

## 2015-11-03 NOTE — Care Management Obs Status (Signed)
[facility] NOTIFICATION   Patient Details  Name: [name] MRN: [id number] Date of Birth: June 15, 1950   Medicare Observation Status Notification Given:  Yes    Lamine Laton, Rory Percy, RN 11/03/2015, 2:41 PM

## 2015-11-03 NOTE — Progress Notes (Addendum)
[facility] PROGRESS NOTE  Thadius Smisek Blankenbeckler CBU:[id number] DOB: [birth year] DOA: 11/01/2015   PCP: [name], MD     Assessment/Plan: Principal Problem:   Sepsis [facility]) Active Problems:   CAD, s/p CABG 2013   ESRD (end stage renal disease) on dialysis ([place])   Renal carcinoma ([place])   DM type 2 (diabetes mellitus, type 2) ([place])   Bilateral blindness   End stage renal failure on dialysis ([place])   HLD (hyperlipidemia)   Abdominal pain   UTI (lower urinary tract infection)   Abnormal CT of the abdomen   65 y.o. male with medical history significant of end-stage renal disease on dialysis (MWF), CAD, s/p CABG 2013, hyperlipidemia, kidney cancer status post right renal resection (S/p right nephrectomy in 01/2015. post-operative course c/b recurrent abscesses between the nephrectomy and abdominal wall. S/p RLQ drain 07/2015 and treated with Vanc/ Cefepime/ Flagyl. rains removed in 09/03/15), r, prostate cancer, hypotension, arthritis, anemia, bilateral blindness, hyperlipidemia, who presents with fever, chills, abdominal pain.CT abdomen/pelvis showed circumferential thickening in the ascending colon with some surrounding inflammatory changes, cannot rule out underlying neoplasm    Assessment and plan Sepsis ([place]) with gram-negative rods: pt is septic with elevated lactate and fever.  Hypotensive in the ER, 94/59. The source of infection is likely intra-abdominal, colonic versus renal abscess / UTI. Given his recent postop infection with nephrostomy tube that was pulled 6 weeks ago, CT-abdomen/pelvis, which did not show abscess. However this was done without contrast, repeat CT scan with contrast today to rule out perinephric abscess or pelvic abscess Continue -IV   Zosyn day #2 , discontinue vancomycin -f/u Bx and Ux   Procalcitonin 45.38 -IVF: 1.5L of NS bolus in ED, followed by 75 cc/h (patient has ESRD, limiting aggressive IV fluids treatment). Blood culture positive for  gram-negative rods-Escherichia coli    Mild UTI-continue antibiotics as above  Hx of Hypotension: -continue Midodrine  CAD: s/p CABG. No chest pain. -continue ASA and Lipitor  Renal carcinoma [facility]): s/p of R nephrectomy, complicated by postop infection with nephrostomy tube that was pulled 6 weeks ago. CT-abdomen/pelvis has no new abscess formation.    ESRD (end stage renal disease) on dialysis (MWF): potassium 0.6, bicarbonate 27, creatinine 6.99, BUN 86. -left message to renal box for HD -continue Sensipar, Renvela and Calcitriol  Diet controlled DM-II: Last A1c 5.2, controled. Patient is not taking meds at home -SSI  HLD: Last LDL was 32 on 09/28/11 -Continue home medications: Lipitor  Abdominal pain: Etiology is not clear. Lipase 28. CT abdomen/pelvis did not show new abscess, but showed showed circumferential thickening in the ascending colon with some surrounding inflammatory changes. The wall thickening has increased in the interval from the prior exam. This raises suspicion for possible underlying neoplasm per radiologist. Soft tissue density adjacent to the hepatic flexure and tip of the liver. Given the patient's clinical history the possibility of local recurrence of previously resected renal carcinoma. Vs  Primary colon cancer,  patient seen by gastroenterology, anticipated to have colonoscopy tomorrow, GI consult feels soft tissue density in the retroperitoneum could be renal in origin, will repeat CT scan with contrast to further evaluate    Anemia of chronic disease/thrombocytopenia No evidence of GI bleeding, platelets down likely secondary to sepsis Discontinue heparin , repeat CBC   DVT prophylaxsis heparin     Code Status:  Full code     Family Communication: Discussed in detail with the patient, all imaging results, lab results  explained to the patient   Disposition Plan:  Anticipate discharge in the next 3-4 days      Consultants:    Gastroenterology  Nephrology  Procedures:  None    Antibiotics: Anti-infectives    Start     Dose/Rate Route Frequency Ordered Stop   11/02/15 1200  vancomycin (VANCOCIN) IVPB 1000 mg/200 mL premix  Status:  Discontinued     1,000 mg 200 mL/hr over 60 Minutes Intravenous Every M-W-F (Hemodialysis) 11/02/15 1036 11/03/15 0942   11/01/15 2200  piperacillin-tazobactam (ZOSYN) IVPB 2.25 g     2.25 g 100 mL/hr over 30 Minutes Intravenous Every 8 hours 11/01/15 1821        HPI/Subjective: Blood pressure continues to be soft   Objective: Vitals:   11/03/15 0440 11/03/15 0519 11/03/15 0749 11/03/15 0952  BP: (!) 71/34 (!) 96/57 120/66 (!) 147/79  Pulse: 62  62 (!) 57  Resp: _0 Temp: 98.6 F (37 C)  98.3 F (36.8 C) 97.9 F (36.6 C)  TempSrc: Oral  Oral Oral  SpO2: 92%  99% 98%  Weight:      Height:        Intake/Output Summary (Last 24 hours) at 11/03/15 1026 Last data filed at 11/03/15 0617  Gross per 24 hour  Intake              120 ml  Output             2300 ml  Net            -2180 ml    Exam:  Examination:  General exam: Appears calm and comfortable  Respiratory system: Clear to auscultation. Respiratory effort normal. Cardiovascular system: S1 & S2 heard, RRR. No JVD, murmurs, rubs, gallops or clicks. No pedal edema. Gastrointestinal system: Abdomen is nondistended, soft and nontender. No organomegaly or masses felt. Normal bowel sounds heard. Central nervous system: Alert and oriented. No focal neurological deficits. Extremities: Symmetric 5 x 5 power. Skin: No rashes, lesions or ulcers Psychiatry: Judgement and insight appear normal. Mood & affect appropriate.     Data Reviewed: I have personally reviewed following labs and imaging studies  Micro Results Recent Results (from the past 240 hour(s))  Culture, blood (Routine x 2)     Status: None (Preliminary result)   Collection Time: 11/01/15  6:12 PM  Result Value Ref Range Status    Specimen Description BLOOD RIGHT FOREARM  Final   Special Requests BOTTLES DRAWN AEROBIC AND ANAEROBIC 5CC  Final   Culture  Setup Time   Final    GRAM NEGATIVE RODS AEROBIC BOTTLE ONLY Organism ID to follow CRITICAL RESULT CALLED TO, READ BACK BY AND VERIFIED WITH: M TURNER 11/02/15 @ 44 M VESTAL    Culture GRAM NEGATIVE RODS  Final   Report Status PENDING  Incomplete  Blood Culture ID Panel (Reflexed)     Status: Abnormal   Collection Time: 11/01/15  6:12 PM  Result Value Ref Range Status   Enterococcus species NOT DETECTED NOT DETECTED Final   Vancomycin resistance NOT DETECTED NOT DETECTED Final   Listeria monocytogenes NOT DETECTED NOT DETECTED Final   Staphylococcus species NOT DETECTED NOT DETECTED Final   Staphylococcus aureus NOT DETECTED NOT DETECTED Final   Methicillin resistance NOT DETECTED NOT DETECTED Final   Streptococcus species NOT DETECTED NOT DETECTED Final   Streptococcus agalactiae NOT DETECTED NOT DETECTED Final   Streptococcus pneumoniae NOT DETECTED NOT DETECTED Final   Streptococcus pyogenes  NOT DETECTED NOT DETECTED Final   Acinetobacter baumannii NOT DETECTED NOT DETECTED Final   Enterobacteriaceae species DETECTED (A) NOT DETECTED Final    Comment: CRITICAL RESULT CALLED TO, READ BACK BY AND VERIFIED WITH: M TURNER 11/02/15 @ 1143 M VESTAL    Enterobacter cloacae complex NOT DETECTED NOT DETECTED Final   Escherichia coli DETECTED (A) NOT DETECTED Final    Comment: CRITICAL RESULT CALLED TO, READ BACK BY AND VERIFIED WITH: M TURNER 11/02/15 @ 21 M VESTAL    Klebsiella oxytoca NOT DETECTED NOT DETECTED Final   Klebsiella pneumoniae NOT DETECTED NOT DETECTED Final   Proteus species NOT DETECTED NOT DETECTED Final   Serratia marcescens NOT DETECTED NOT DETECTED Final   Carbapenem resistance NOT DETECTED NOT DETECTED Final   Haemophilus influenzae NOT DETECTED NOT DETECTED Final   Neisseria meningitidis NOT DETECTED NOT DETECTED Final   Pseudomonas  aeruginosa NOT DETECTED NOT DETECTED Final   Candida albicans NOT DETECTED NOT DETECTED Final   Candida glabrata NOT DETECTED NOT DETECTED Final   Candida krusei NOT DETECTED NOT DETECTED Final   Candida parapsilosis NOT DETECTED NOT DETECTED Final   Candida tropicalis NOT DETECTED NOT DETECTED Final  Culture, blood (Routine x 2)     Status: None (Preliminary result)   Collection Time: 11/01/15  6:30 PM  Result Value Ref Range Status   Specimen Description BLOOD RIGHT HAND  Final   Special Requests BOTTLES DRAWN AEROBIC AND ANAEROBIC 5CC  Final   Culture NO GROWTH < 24 HOURS  Final   Report Status PENDING  Incomplete  Urine culture     Status: None   Collection Time: 11/01/15  7:17 PM  Result Value Ref Range Status   Specimen Description URINE, RANDOM  Final   Special Requests NONE  Final   Culture NO GROWTH  Final   Report Status 11/02/2015 FINAL  Final  MRSA PCR Screening     Status: None   Collection Time: 11/02/15 12:09 AM  Result Value Ref Range Status   MRSA by PCR NEGATIVE NEGATIVE Final    Comment:        The GeneXpert MRSA Assay (FDA approved for NASAL specimens only), is one component of a comprehensive MRSA colonization surveillance program. It is not intended to diagnose MRSA infection nor to guide or monitor treatment for MRSA infections.     Radiology Reports Ct Abdomen Pelvis Wo Contrast  Result Date: 11/01/2015 CLINICAL DATA:  Fever and chills EXAM: CT ABDOMEN AND PELVIS WITHOUT CONTRAST TECHNIQUE: Multidetector CT imaging of the abdomen and pelvis was performed following the standard protocol without IV contrast. COMPARISON:  05/11/2015 FINDINGS: Lower chest:  Minimal right basilar atelectasis is noted. Hepatobiliary: Gallbladder has been surgically removed. The liver is within normal limits Pancreas: No mass or inflammatory process identified on this un-enhanced exam. Spleen: Within normal limits in size. Adrenals/Urinary Tract: The adrenal glands are within  normal limits. Changes of prior right nephrectomy are noted. Renal cystic changes are noted on the left stable from the prior study. Stomach/Bowel: Prominent fecal material is noted within the rectum which may represent a degree of impaction. No obstructive changes are seen. Inflammatory changes are again noted in the region of the ascending colon. Adjacent to the hepatic flexure and tip of the liver there is a 3.6 cm mildly enhancing soft tissue lesion which appears somewhat larger than that seen on the prior exam. This may represent sequelae from the prior infection The previously seen changes extending through the abdominal  wall on the right have resolved. The appendix is within normal limits. Vascular/Lymphatic: No pathologically enlarged lymph nodes. No evidence of abdominal aortic aneurysm. Reproductive: Brachytherapy seeds are noted within the prostate. Other: None. Musculoskeletal: Postsurgical changes are noted in the proximal left femur. Degenerative changes of the lumbar spine are seen. IMPRESSION: Circumferential thickening in the ascending colon with some surrounding inflammatory changes. The wall thickening has increased in the interval from the prior exam. This raises suspicion for possible underlying neoplasm. Direct visualization is recommended. Soft tissue density adjacent to the hepatic flexure and tip of the liver. Given the patient's clinical history the possibility of local recurrence of previously resected renal carcinoma. Resolution of previously seen inflammatory changes extending to the right abdominal wall. Changes of prior nephrectomy. No other acute abnormality is seen. Electronically Signed   By: Inez Catalina M.D.   On: 11/01/2015 19:39  Dg Chest Portable 1 View  Result Date: 11/01/2015 CLINICAL DATA:  Fever and chills EXAM: PORTABLE CHEST 1 VIEW COMPARISON:  05/11/2015 FINDINGS: Chronic elevation of the right diaphragm. Normal heart size and mediastinal contours status post CABG.  There is no edema, consolidation, effusion, or pneumothorax. IMPRESSION: Negative for pneumonia. Electronically Signed   By: Monte Fantasia M.D.   On: 11/01/2015 19:10    CBC  Recent Labs Lab 11/02/15 0436  WBC 5.0  HGB 8.2*  HCT 25.4*  PLT 59*  MCV 106.3*  MCH 34.3*  MCHC 32.3  RDW 14.3    Chemistries   Recent Labs Lab 11/01/15 1812 11/02/15 0436 11/03/15 0553  NA 144 145 140  K 3.6 3.7 4.1  CL 103 106 100*  CO2 _0 GLUCOSE 131* 132* 82  BUN 86* 90* 39*  CREATININE 6.99* 7.68* 4.78*  CALCIUM 9.6 9.3 9.5  AST  --   --  39  ALT  --   --  27  ALKPHOS  --   --  92  BILITOT  --   --  1.1   ------------------------------------------------------------------------------------------------------------------ estimated creatinine clearance is 18.7 mL/min (by C-G formula based on SCr of 4.78 mg/dL). ------------------------------------------------------------------------------------------------------------------ No results for input(s): HGBA1C in the last 72 hours. ------------------------------------------------------------------------------------------------------------------ No results for input(s): CHOL, HDL, LDLCALC, TRIG, CHOLHDL, LDLDIRECT in the last 72 hours. ------------------------------------------------------------------------------------------------------------------ No results for input(s): TSH, T4TOTAL, T3FREE, THYROIDAB in the last 72 hours.  Invalid input(s): FREET3 ------------------------------------------------------------------------------------------------------------------  Recent Labs  11/03/15 0553  VITAMINB12 912    Coagulation profile  Recent Labs Lab 11/01/15 2200  INR 1.12    No results for input(s): DDIMER in the last 72 hours.  Cardiac Enzymes No results for input(s): CKMB, TROPONINI, MYOGLOBIN in the last 168 hours.  Invalid input(s):  CK ------------------------------------------------------------------------------------------------------------------ Invalid input(s): POCBNP   CBG:  Recent Labs Lab 11/02/15 0806 11/02/15 1133 11/02/15 1821 11/02/15 2021 11/03/15 0753  GLUCAP 97 89 76 74 72       Studies: Ct Abdomen Pelvis Wo Contrast  Result Date: 11/01/2015 CLINICAL DATA:  Fever and chills EXAM: CT ABDOMEN AND PELVIS WITHOUT CONTRAST TECHNIQUE: Multidetector CT imaging of the abdomen and pelvis was performed following the standard protocol without IV contrast. COMPARISON:  05/11/2015 FINDINGS: Lower chest:  Minimal right basilar atelectasis is noted. Hepatobiliary: Gallbladder has been surgically removed. The liver is within normal limits Pancreas: No mass or inflammatory process identified on this un-enhanced exam. Spleen: Within normal limits in size. Adrenals/Urinary Tract: The adrenal glands are within normal limits. Changes of prior right nephrectomy are noted. Renal cystic changes are  noted on the left stable from the prior study. Stomach/Bowel: Prominent fecal material is noted within the rectum which may represent a degree of impaction. No obstructive changes are seen. Inflammatory changes are again noted in the region of the ascending colon. Adjacent to the hepatic flexure and tip of the liver there is a 3.6 cm mildly enhancing soft tissue lesion which appears somewhat larger than that seen on the prior exam. This may represent sequelae from the prior infection The previously seen changes extending through the abdominal wall on the right have resolved. The appendix is within normal limits. Vascular/Lymphatic: No pathologically enlarged lymph nodes. No evidence of abdominal aortic aneurysm. Reproductive: Brachytherapy seeds are noted within the prostate. Other: None. Musculoskeletal: Postsurgical changes are noted in the proximal left femur. Degenerative changes of the lumbar spine are seen. IMPRESSION:  Circumferential thickening in the ascending colon with some surrounding inflammatory changes. The wall thickening has increased in the interval from the prior exam. This raises suspicion for possible underlying neoplasm. Direct visualization is recommended. Soft tissue density adjacent to the hepatic flexure and tip of the liver. Given the patient's clinical history the possibility of local recurrence of previously resected renal carcinoma. Resolution of previously seen inflammatory changes extending to the right abdominal wall. Changes of prior nephrectomy. No other acute abnormality is seen. Electronically Signed   By: Inez Catalina M.D.   On: 11/01/2015 19:39  Dg Chest Portable 1 View  Result Date: 11/01/2015 CLINICAL DATA:  Fever and chills EXAM: PORTABLE CHEST 1 VIEW COMPARISON:  05/11/2015 FINDINGS: Chronic elevation of the right diaphragm. Normal heart size and mediastinal contours status post CABG. There is no edema, consolidation, effusion, or pneumothorax. IMPRESSION: Negative for pneumonia. Electronically Signed   By: Monte Fantasia M.D.   On: 11/01/2015 19:10     Lab Results  Component Value Date   HGBA1C 5.2 02/13/2015   HGBA1C 5.6 07/28/2014   HGBA1C 6.0 (H) 09/28/2011   Lab Results  Component Value Date   LDLCALC 32 09/28/2011   CREATININE 4.78 (H) 11/03/2015       Scheduled Meds: . acidophilus  1 capsule Oral Daily  . aspirin EC  81 mg Oral QHS  . atorvastatin  10 mg Oral QHS  . calcitRIOL  0.5 mcg Oral Q M,W,F-HD  . cinacalcet  30 mg Oral Q supper  . [START ON 11/04/2015] darbepoetin (ARANESP) injection - DIALYSIS  100 mcg Intravenous Q Wed-HD  . insulin aspart  0-5 Units Subcutaneous QHS  . insulin aspart  0-9 Units Subcutaneous TID WC  . midodrine  10 mg Oral 3 times per day  . multivitamin  1 tablet Oral QHS  . peg 3350 powder  1 kit Oral Once  . piperacillin-tazobactam (ZOSYN)  IV  2.25 g Intravenous Q8H  . sevelamer carbonate  4,000 mg Oral TID WC  . sodium  chloride  250 mL Intravenous Once  . sodium chloride flush  3 mL Intravenous Q12H   Continuous Infusions:    LOS: 0 days    Time spent: >30 MINS    [facility]  [facility] Pager [phone]. If 7PM-7AM, please contact night-coverage at [email or web address], password [facility] 11/03/2015, 10:26 AM  LOS: 0 days

## 2015-11-03 NOTE — Evaluation (Addendum)
Occupational Therapy Evaluation Patient Details Name: [name] MRN: [id number] DOB: February 01, 1951 Today's Date: 11/03/2015    History of Present Illness  65 y.o. male ESRD MWF HD AT [facility]  ( bilateral blindness),admited to Observation  With fever  103 / abd pain / with ho  kidney cancer status post right renal resection     Clinical Impression   Pt with decline in function and safety with ADLs and ADL mobility with decreased strength, balance and endurance. Pt hx of blindness. Pt requires extensive assist with ADLs and +2 assist with mobility.Pt's nurse tech in to assist OT. Pt very pleasant and cooperative, however pt's wife present and very snappy with nurse tech stating to the nurse tech "You have been being snappy with me all day". Uncertain of what pt meant by that as OT did not observe or hear nurse tech being "snappy" with pt's wife during entire session. At end of session, pt's wife apologizing to OT for her comments to nurse tech. Pt would benefit from acute OT services to address impairments to increase level of function and safety       Follow Up Recommendations  Home health OT;SNF;Other (comment) (SNF woiuld be appropriate for current level of care, however pts' wife against SNF and wants HH. Pt would need 24 hour assist and +2 assist for mobility at current level of care)    Equipment Recommendations  Tub/shower bench;Other (comment) ([place] aide)    Recommendations for Other Services       Precautions / Restrictions Precautions Precautions: Fall;Other (comment) (blindness) Restrictions Weight Bearing Restrictions: No      Mobility Bed Mobility Overal bed mobility: Needs Assistance Bed Mobility: Rolling;Sidelying to Sit;Supine to Sit Rolling: Mod assist Sidelying to sit: Mod assist;HOB elevated Supine to sit: Mod assist;+2 for physical assistance     General bed mobility comments: required cueing for hand placement on EOB rails with initial bed mobility modified  indepdent but required mod assist to go from side lying to seated EOB,  Transfers Overall transfer level: Needs assistance Equipment used: Rolling walker (2 wheeled) Transfers: Sit to/from Stand Sit to Stand: Max assist;+2 physical assistance         General transfer comment: required max assist to power up from EOB. Sat back down due to fatigue EOB and required light mod assist +2 to power up second time in order to clean stool from sacral area.     Balance     Sitting balance-Leahy Scale: Poor       Standing balance-Leahy Scale: Poor                              ADL Overall ADL's : Needs assistance/impaired     Grooming: Wash/dry hands;Wash/dry face;Minimal assistance;Sitting   Upper Body Bathing: Maximal assistance;Sitting   Lower Body Bathing: Total assistance;Sitting/lateral leans;Sit to/from stand   Upper Body Dressing : Maximal assistance;Sitting   Lower Body Dressing: Total assistance   Toilet Transfer: Maximal assistance;+2 for physical assistance;Stand-pivot;RW   Toileting- Clothing Manipulation and Hygiene: Total assistance;+2 for physical assistance;Sit to/from stand Toileting - Clothing Manipulation Details (indicate cue type and reason): pt had soiled sheets from BM and required rolling in bed and standing to complete hygiene total A     Functional mobility during ADLs: Maximal assistance;+2 for physical assistance       Vision Vision Assessment?: Yes (pt is blind)  Pertinent Vitals/Pain Pain Assessment: No/denies pain     Hand Dominance Left   Extremity/Trunk Assessment Upper Extremity Assessment Upper Extremity Assessment: Generalized weakness   Lower Extremity Assessment Lower Extremity Assessment: Defer to PT evaluation   Cervical / Trunk Assessment Cervical / Trunk Assessment: Kyphotic   Communication Communication Communication: No difficulties   Cognition Arousal/Alertness:  Awake/alert;Lethargic Behavior During Therapy: WFL for tasks assessed/performed Overall Cognitive Status: Within Functional Limits for tasks assessed                     General Comments   pt pleasant and cooperative, wife seemed to irritated with nurse tech for no apparent reason                 Home Living Family/patient expects to be discharged to:: Private residence Living Arrangements: Spouse/significant other Available Help at Discharge: Family;Available 24 hours/day Type of Home: House Home Access: Ramped entrance     Home Layout: One level     Bathroom Shower/Tub: Teacher, early years/pre: Handicapped height Bathroom Accessibility: Yes   Home Equipment: Clinical cytogeneticist - 4 wheels;Bedside commode;Wheelchair - manual;Grab bars - toilet   Additional Comments: pt ha stub shower and lift chair/recliner      Prior Functioning/Environment Level of Independence: Needs assistance  Gait / Transfers Assistance Needed: Rollator all the time ADL's / Homemaking Assistance Needed: Wife assists with setting water temperature, washing back, and LB dressing.         OT Diagnosis: Generalized weakness;Blindness and low vision   OT Problem List: Decreased strength;Impaired balance (sitting and/or standing);Impaired vision/perception;Decreased activity tolerance;Decreased knowledge of use of DME or AE   OT Treatment/Interventions: Self-care/ADL training;DME and/or AE instruction;Therapeutic activities;Neuromuscular education;Therapeutic exercise;Patient/family education    OT Goals(Current goals can be found in the care plan section) Acute Rehab OT Goals Patient Stated Goal: Home OT Goal Formulation: With patient/family Time For Goal Achievement: 11/10/15 Potential to Achieve Goals: Good ADL Goals Pt Will Perform Grooming: with min guard assist;sitting;with caregiver independent in assisting Pt Will Perform Upper Body Bathing: with mod assist;sitting;with  caregiver independent in assisting (with set up and cues due to blindness) Pt Will Perform Lower Body Bathing: with max assist;with mod assist;sitting/lateral leans;with caregiver independent in assisting (with set up and cues due to blindness) Pt Will Perform Upper Body Dressing: with mod assist;with caregiver independent in assisting;sitting Pt Will Transfer to Toilet: with max assist;with mod assist;bedside commode Pt Will Perform Toileting - Clothing Manipulation and hygiene: with mod assist;sitting/lateral leans;with caregiver independent in assisting Pt Will Perform Tub/Shower Transfer: with max assist;with mod assist;with caregiver independent in assisting;tub bench Additional ADL Goal #1: pt will complete bed mobility with min A to sit EOB in prep for selfcare and transfers  OT Frequency: Min 2X/week   Barriers to D/C: Decreased caregiver support  SNF woiuld be appropriate for current level of care, however pts' wife against SNF and wants HH. Pt would need 24 hour assist and +2 assist for mobility at current level of care                     End of Session Equipment Utilized During Treatment: Rolling walker;Gait belt  Activity Tolerance: Patient limited by lethargy Patient left: in chair;with call bell/phone within reach;with family/visitor present   Time: 1352-1418 OT Time Calculation (min): 26 min Charges:  OT General Charges $OT Visit: 1 Procedure OT Evaluation $OT Eval Moderate Complexity: 1 Procedure OT Treatments $Therapeutic Activity: 8-22 mins  G-Codes: OT G-codes **NOT FOR INPATIENT CLASS** Functional Assessment Tool Used: clinical judgement Functional Limitation: Self care Self Care Current Status [id number]): At least 60 percent but less than 80 percent impaired, limited or restricted Self Care Goal Status [id number]): At least 40 percent but less than 60 percent impaired, limited or restricted  Britt Bottom 11/03/2015, 3:00 PM

## 2015-11-03 NOTE — Progress Notes (Signed)
OT Cancellation Note  Patient Details Name: [name] MRN: [id number] DOB: [birth year]   Cancelled Treatment:    Reason Eval/Treat Not Completed: Fatigue/lethargy limiting ability to participate. Pt's wife equested OT return later as was exhausted from going for colonoscopy this morning, although he ended up not having done. Pt not opening eyes but nodding head "yes" when his wife asked if he would like for OT to try later. OT will reattempt later today as able/as appropriate and if unable, will see tomorrow  Britt Bottom 11/03/2015, 11:00 AM

## 2015-11-03 NOTE — Significant Event (Signed)
Hypoglycemic Event  CBG: 64 at 1212  Treatment: D50 IV 25 mL  Symptoms: None  Follow-up CBG: Time:1251 CBG Result:102  Possible Reasons for Event: Other: NPO  Comments/MD notified: MD made aware    [name] sedo

## 2015-11-03 NOTE — Anesthesia Preprocedure Evaluation (Deleted)
Anesthesia Evaluation  Patient identified by MRN, date of birth, ID band Patient awake    Reviewed: Allergy & Precautions, NPO status , Patient's Chart, lab work & pertinent test results  Airway Mallampati: II  TM Distance: >3 FB Neck ROM: Full    Dental  (+) Teeth Intact, Dental Advisory Given   Pulmonary    breath sounds clear to auscultation       Cardiovascular hypertension, + CAD and + Peripheral Vascular Disease  + dysrhythmias  Rhythm:Regular Rate:Normal     Neuro/Psych PSYCHIATRIC DISORDERS Depression Blind    GI/Hepatic negative GI ROS, Neg liver ROS,   Endo/Other  diabetes, Type 2  Renal/GU ESRF and DialysisRenal disease (K+ 4.1)[facility] Dialysis MWF  negative genitourinary   Musculoskeletal  (+) Arthritis , Osteoarthritis,    Abdominal   Peds negative pediatric ROS (+)  Hematology  (+) Blood dyscrasia, anemia , Thrombocytopenia-- Plt 59k   Anesthesia Other Findings - Blind (both eyes) - HLD   Reproductive/Obstetrics negative OB ROS                             Anesthesia Physical  Anesthesia Plan  ASA: III  Anesthesia Plan: MAC   Post-op Pain Management:    Induction: Intravenous  Airway Management Planned: Natural Airway  Additional Equipment:   Intra-op Plan:   Post-operative Plan:   Informed Consent: I have reviewed the patients History and Physical, chart, labs and discussed the procedure including the risks, benefits and alternatives for the proposed anesthesia with the patient or authorized representative who has indicated his/her understanding and acceptance.   Dental advisory given  Plan Discussed with: CRNA  Anesthesia Plan Comments: (Discussed risks/benefits/alternatives to MAC sedation including need for ventilatory support, hypotension, need for conversion to general anesthesia.  All patient questions answered.  Patient/guardian wishes to  proceed.  **CASE CANCELLED DUE TO INCOMPLETE PREP**)       Anesthesia Quick Evaluation

## 2015-11-03 NOTE — Evaluation (Deleted)
Occupational Therapy Evaluation Patient Details Name: [name] MRN: [id number] DOB: April 10, 1950 Today's Date: 11/03/2015    History of Present Illness  65 y.o. male ESRD MWF HD AT [place]  ( bilateral blindness),admited to Observation  With fever  103 / abd pain / with ho  kidney cancer status post right renal resection     Clinical Impression   Pt with decline in function and safety with ADLs and ADL mobility with decreased strength, balance and endurance. Pt hx of blindness. Pt requires extensive assist with ADLs and +2 assist with mobility.Pt's nurse tech in to assist OT. Pt very pleasant and cooperative, however pt's wife present and very snappy with nurse tech stating to the nurse tech "You have been being snappy with me all day". Uncertain of what pt meant by that as OT did not observe or hear nurse tech being "snappy" with pt's wife during entire session. At end of session, pt's wife apologizing to OT for her comments to nurse tech. Pt would benefit from acute OT services to address impairments to increase level of function and safety    Follow Up Recommendations  Home health OT;SNF;Other (comment) (SNF woiuld be appropriate for current level of care, however pts' wife against SNF and wants HH. Pt would need 24 hour assist and +2 assist for mobility at current level of care)    Equipment Recommendations  Tub/shower bench;Other (comment) ([place] aide)    Recommendations for Other Services       Precautions / Restrictions Precautions Precautions: Fall;Other (comment) (blindness) Restrictions Weight Bearing Restrictions: No      Mobility Bed Mobility Overal bed mobility: Needs Assistance Bed Mobility: Rolling;Sidelying to Sit;Supine to Sit Rolling: Mod assist Sidelying to sit: Mod assist;HOB elevated Supine to sit: Mod assist;+2 for physical assistance     General bed mobility comments: required cueing for hand placement on EOB rails with initial bed mobility modified  indepdent but required mod assist to go from side lying to seated EOB,  Transfers Overall transfer level: Needs assistance Equipment used: Rolling walker (2 wheeled) Transfers: Sit to/from Stand Sit to Stand: Max assist;+2 physical assistance         General transfer comment: required max assist to power up from EOB. Sat back down due to fatigue EOB and required light mod assist +2 to power up second time in order to clean stool from sacral area.     Balance     Sitting balance-Leahy Scale: Poor       Standing balance-Leahy Scale: Poor                              ADL Overall ADL's : Needs assistance/impaired     Grooming: Wash/dry hands;Wash/dry face;Minimal assistance;Sitting   Upper Body Bathing: Maximal assistance;Sitting   Lower Body Bathing: Total assistance;Sitting/lateral leans;Sit to/from stand   Upper Body Dressing : Maximal assistance;Sitting   Lower Body Dressing: Total assistance   Toilet Transfer: Maximal assistance;+2 for physical assistance;Stand-pivot;RW   Toileting- Clothing Manipulation and Hygiene: Total assistance;+2 for physical assistance;Sit to/from stand Toileting - Clothing Manipulation Details (indicate cue type and reason): pt had soiled sheets from BM and required rolling in bed and standing to complete hygiene total A     Functional mobility during ADLs: Maximal assistance;+2 for physical assistance       Vision Vision Assessment?: Yes (pt is blind)  Pertinent Vitals/Pain Pain Assessment: No/denies pain     Hand Dominance Left   Extremity/Trunk Assessment Upper Extremity Assessment Upper Extremity Assessment: Generalized weakness   Lower Extremity Assessment Lower Extremity Assessment: Defer to PT evaluation   Cervical / Trunk Assessment Cervical / Trunk Assessment: Kyphotic   Communication Communication Communication: No difficulties   Cognition Arousal/Alertness:  Awake/alert;Lethargic Behavior During Therapy: WFL for tasks assessed/performed Overall Cognitive Status: Within Functional Limits for tasks assessed                     General Comments   pt pleasant and cooperative, pt's wife seemed irritated with nurse tech for no apparent reason                 Home Living Family/patient expects to be discharged to:: Private residence Living Arrangements: Spouse/significant other Available Help at Discharge: Family;Available 24 hours/day Type of Home: House Home Access: Ramped entrance     Home Layout: One level     Bathroom Shower/Tub: Teacher, early years/pre: Handicapped height Bathroom Accessibility: Yes   Home Equipment: Clinical cytogeneticist - 4 wheels;Bedside commode;Wheelchair - manual;Grab bars - toilet   Additional Comments: pt ha stub shower and lift chair/recliner      Prior Functioning/Environment Level of Independence: Needs assistance  Gait / Transfers Assistance Needed: Rollator all the time ADL's / Homemaking Assistance Needed: Wife assists with setting water temperature, washing back, and LB dressing.         OT Diagnosis: Generalized weakness;Blindness and low vision   OT Problem List: Decreased strength;Impaired balance (sitting and/or standing);Impaired vision/perception;Decreased activity tolerance;Decreased knowledge of use of DME or AE   OT Treatment/Interventions: Self-care/ADL training;DME and/or AE instruction;Therapeutic activities;Neuromuscular education;Therapeutic exercise;Patient/family education    OT Goals(Current goals can be found in the care plan section) Acute Rehab OT Goals Patient Stated Goal: Home OT Goal Formulation: With patient/family Time For Goal Achievement: 11/10/15 Potential to Achieve Goals: Good ADL Goals Pt Will Perform Grooming: with min guard assist;sitting;with caregiver independent in assisting Pt Will Perform Upper Body Bathing: with mod  assist;sitting;with caregiver independent in assisting (with set up and cues due to blindness) Pt Will Perform Lower Body Bathing: with max assist;with mod assist;sitting/lateral leans;with caregiver independent in assisting (with set up and cues due to blindness) Pt Will Perform Upper Body Dressing: with mod assist;with caregiver independent in assisting;sitting Pt Will Transfer to Toilet: with max assist;with mod assist;bedside commode Pt Will Perform Toileting - Clothing Manipulation and hygiene: with mod assist;sitting/lateral leans;with caregiver independent in assisting Pt Will Perform Tub/Shower Transfer: with max assist;with mod assist;with caregiver independent in assisting;tub bench Additional ADL Goal #1: pt will complete bed mobility with min A to sit EOB in prep for selfcare and transfers  OT Frequency: Min 2X/week   Barriers to D/C: Decreased caregiver support  SNF woiuld be appropriate for current level of care, however pts' wife against SNF and wants HH. Pt would need 24 hour assist and +2 assist for mobility at current level of care                     End of Session Equipment Utilized During Treatment: Rolling walker;Gait belt  Activity Tolerance: Patient limited by lethargy Patient left: in chair;with call bell/phone within reach;with family/visitor present   Time: 1352-1418 OT Time Calculation (min): 26 min Charges:  OT General Charges $OT Visit: 1 Procedure OT Evaluation $OT Eval Moderate Complexity: 1 Procedure OT Treatments $Therapeutic Activity: 8-22 mins  G-Codes:    Britt Bottom 11/03/2015, 2:42 PM

## 2015-11-03 NOTE — Care Management Note (Signed)
Case Management Note  Patient Details  Name: [name] MRN: [id number] Date of Birth: [birth year]  Subjective/Objective:      CM following for progression and d/c planning.               Action/Plan: 11/03/2015 Unable to meet with pt wife on 11/02/2015, today this CM met with pt wife to discuss Aristes needs and review OBS status. Pt is acitive with [employer] for [facility] services. This will be resumed per wife wishes.   Expected Discharge Date:                  Expected Discharge Plan:  [place]  In-House Referral:  NA  Discharge planning Services  CM Consult  Post Acute Care Choice:  Home Health Choice offered to:  Spouse  DME Arranged:   (Pt has DME and ramp per wife. ) DME Agency:  NA  HH Arranged:  RN, PT, OT HH Agency:  [facility]  Status of Service:  In process, will continue to follow  If discussed at Long Length of Stay Meetings, dates discussed:    Additional Comments:  Adron Bene, RN 11/03/2015, 2:46 PM

## 2015-11-03 NOTE — Progress Notes (Signed)
Progress Note   Subjective  Patient was scheduled for colonoscopy this morning however endorsed passing brown stool and was not cleared from his bowel prep. We discussed this and plan for colonoscopy tomorrow.    Objective   Vital signs in last 24 hours: Temp:  [97.9 F (36.6 C)-99.8 F (37.7 C)] 97.9 F (36.6 C) (08/01 0952) Pulse Rate:  [55-68] 57 (08/01 0952) Resp:  [12-22] 12 (08/01 0952) BP: (71-147)/(34-79) 147/79 (08/01 0952) SpO2:  [92 %-100 %] 98 % (08/01 0952) Weight:  [201 lb 11.5 oz (91.5 kg)-206 lb 12.7 oz (93.8 kg)] 205 lb 0.4 oz (93 kg) (07/31 2017) Last BM Date: 11/02/15 General:    white male in NAD Heart:  Regular rate and rhythm Lungs: Respirations even and unlabored, lungs CTA bilaterally Abdomen:  Soft, protuberant,  nontender and nondistended.  Extremities:  Without edema. Neurologic:  Alert and oriented,  grossly normal neurologically. Psych:  Cooperative. Normal mood and affect.  Intake/Output from previous day: 07/31 0701 - 08/01 0700 In: 120 [P.O.:120] Out: 2300  Intake/Output this shift: No intake/output data recorded.  Lab Results:  Recent Labs  11/02/15 0436  WBC 5.0  HGB 8.2*  HCT 25.4*  PLT 59*   BMET  Recent Labs  11/01/15 1812 11/02/15 0436 11/03/15 0553  NA 144 145 140  K 3.6 3.7 4.1  CL 103 106 100*  CO2 27 28 30   GLUCOSE 131* 132* 82  BUN 86* 90* 39*  CREATININE 6.99* 7.68* 4.78*  CALCIUM 9.6 9.3 9.5   LFT  Recent Labs  11/03/15 0553  PROT 5.1*  ALBUMIN 2.8*  AST 39  ALT 27  ALKPHOS 92  BILITOT 1.1   PT/INR  Recent Labs  11/01/15 2200  LABPROT 14.5  INR 1.12    Studies/Results: Ct Abdomen Pelvis Wo Contrast  Result Date: 11/01/2015 CLINICAL DATA:  Fever and chills EXAM: CT ABDOMEN AND PELVIS WITHOUT CONTRAST TECHNIQUE: Multidetector CT imaging of the abdomen and pelvis was performed following the standard protocol without IV contrast. COMPARISON:  05/11/2015 FINDINGS: Lower chest:  Minimal  right basilar atelectasis is noted. Hepatobiliary: Gallbladder has been surgically removed. The liver is within normal limits Pancreas: No mass or inflammatory process identified on this un-enhanced exam. Spleen: Within normal limits in size. Adrenals/Urinary Tract: The adrenal glands are within normal limits. Changes of prior right nephrectomy are noted. Renal cystic changes are noted on the left stable from the prior study. Stomach/Bowel: Prominent fecal material is noted within the rectum which may represent a degree of impaction. No obstructive changes are seen. Inflammatory changes are again noted in the region of the ascending colon. Adjacent to the hepatic flexure and tip of the liver there is a 3.6 cm mildly enhancing soft tissue lesion which appears somewhat larger than that seen on the prior exam. This may represent sequelae from the prior infection The previously seen changes extending through the abdominal wall on the right have resolved. The appendix is within normal limits. Vascular/Lymphatic: No pathologically enlarged lymph nodes. No evidence of abdominal aortic aneurysm. Reproductive: Brachytherapy seeds are noted within the prostate. Other: None. Musculoskeletal: Postsurgical changes are noted in the proximal left femur. Degenerative changes of the lumbar spine are seen. IMPRESSION: Circumferential thickening in the ascending colon with some surrounding inflammatory changes. The wall thickening has increased in the interval from the prior exam. This raises suspicion for possible underlying neoplasm. Direct visualization is recommended. Soft tissue density adjacent to the hepatic flexure and tip  of the liver. Given the patient's clinical history the possibility of local recurrence of previously resected renal carcinoma. Resolution of previously seen inflammatory changes extending to the right abdominal wall. Changes of prior nephrectomy. No other acute abnormality is seen. Electronically Signed    By: Inez Catalina M.D.   On: 11/01/2015 19:39  Dg Chest Portable 1 View  Result Date: 11/01/2015 CLINICAL DATA:  Fever and chills EXAM: PORTABLE CHEST 1 VIEW COMPARISON:  05/11/2015 FINDINGS: Chronic elevation of the right diaphragm. Normal heart size and mediastinal contours status post CABG. There is no edema, consolidation, effusion, or pneumothorax. IMPRESSION: Negative for pneumonia. Electronically Signed   By: Monte Fantasia M.D.   On: 11/01/2015 19:10      Assessment / Plan:   65 y/o male with history of renal cell carcinoma and ESRD on dialysis who presented with acute abdominal pain and fever. CT imaging as above. I reviewed the patient's CT scans with radiology yesterday. We both feel the CT thickening of the colon may more likely represent underdistension or secondary inflammatory changes from the inflammation in the retroperitoneum, and less likely a primary colon cancer or colitis, although can't be certain without direct visualization. He has no diarrhea or blood per rectum. He had colonoscopy 18 months ago without any significant pathology in the ascending colon. While I think colon cancer is less likely, however, I offered the patient a colonoscopy to ensure normal given his ongoing / "progressive" CT findings. He was scheduled for today however unfortunately bowel prep was inadequate. He can be placed on clear liquid diet today through midnight and bowel prep again today / this evening, with plans for colonoscopy tomorrow. NPO after MN. Discussed with patient and wife who agreed. Will need to coordinate colonoscopy around dialysis  Of note, there is otherwise an enlarged soft tissue density in the retroperitoneum on CT based on review with radiology, and would consider a Urology consult to review this and see if further evaluation is warranted from their standpoint. Low platelets also noted on admission labs which is new, will repeat CBC today to see where this is trending and further  workup if persistent.  Midvale Cellar, MD [facility] Pager [phone]

## 2015-11-04 ENCOUNTER — Inpatient Hospital Stay (HOSPITAL_COMMUNITY): Payer: Medicare Other

## 2015-11-04 ENCOUNTER — Inpatient Hospital Stay (HOSPITAL_COMMUNITY): Payer: Medicare Other | Admitting: Certified Registered Nurse Anesthetist

## 2015-11-04 ENCOUNTER — Encounter (HOSPITAL_COMMUNITY): Payer: Self-pay | Admitting: *Deleted

## 2015-11-04 ENCOUNTER — Encounter (HOSPITAL_COMMUNITY)
Admission: EM | Disposition: A | Discharge status: Home Health | Payer: Self-pay | Source: Home / Self Care | Attending: Internal Medicine

## 2015-11-04 DIAGNOSIS — A4151 Sepsis due to Escherichia coli [E. coli]: Secondary | ICD-10-CM

## 2015-11-04 DIAGNOSIS — H540 Blindness, both eyes: Secondary | ICD-10-CM

## 2015-11-04 HISTORY — PX: COLONOSCOPY: SHX5424

## 2015-11-04 LAB — PHOSPHORUS: PHOSPHORUS: 5 mg/dL — AB (ref 2.5–4.6)

## 2015-11-04 LAB — POCT I-STAT 4, (NA,K, GLUC, HGB,HCT)
GLUCOSE: 72 mg/dL (ref 65–99)
HEMATOCRIT: 24 % — AB (ref 39.0–52.0)
HEMOGLOBIN: 8.2 g/dL — AB (ref 13.0–17.0)
POTASSIUM: 3.1 mmol/L — AB (ref 3.5–5.1)
SODIUM: 140 mmol/L (ref 135–145)

## 2015-11-04 LAB — COMPREHENSIVE METABOLIC PANEL
ALT: 31 U/L (ref 17–63)
ANION GAP: 15 (ref 5–15)
AST: 38 U/L (ref 15–41)
Albumin: 2.9 g/dL — ABNORMAL LOW (ref 3.5–5.0)
Alkaline Phosphatase: 102 U/L (ref 38–126)
BUN: 48 mg/dL — ABNORMAL HIGH (ref 6–20)
CHLORIDE: 102 mmol/L (ref 101–111)
CO2: 25 mmol/L (ref 22–32)
Calcium: 9.3 mg/dL (ref 8.9–10.3)
Creatinine, Ser: 6.11 mg/dL — ABNORMAL HIGH (ref 0.61–1.24)
GFR, EST AFRICAN AMERICAN: 10 mL/min — AB (ref >60)
GFR, EST NON AFRICAN AMERICAN: 9 mL/min — AB (ref >60)
Glucose, Bld: 86 mg/dL (ref 65–99)
POTASSIUM: 4.6 mmol/L (ref 3.5–5.1)
SODIUM: 142 mmol/L (ref 135–145)
Total Bilirubin: 1.2 mg/dL (ref 0.3–1.2)
Total Protein: 5.3 g/dL — ABNORMAL LOW (ref 6.5–8.1)

## 2015-11-04 LAB — CULTURE, BLOOD (ROUTINE X 2)

## 2015-11-04 LAB — CBC
HCT: 25.9 % — ABNORMAL LOW (ref 39.0–52.0)
Hemoglobin: 8.7 g/dL — ABNORMAL LOW (ref 13.0–17.0)
MCH: 34.4 pg — AB (ref 26.0–34.0)
MCHC: 33.6 g/dL (ref 30.0–36.0)
MCV: 102.4 fL — AB (ref 78.0–100.0)
PLATELETS: 60 10*3/uL — AB (ref 150–400)
RBC: 2.53 MIL/uL — AB (ref 4.22–5.81)
RDW: 13.9 % (ref 11.5–15.5)
WBC: 2.5 10*3/uL — ABNORMAL LOW (ref 4.0–10.5)

## 2015-11-04 LAB — GLUCOSE, CAPILLARY
GLUCOSE-CAPILLARY: 82 mg/dL (ref 65–99)
Glucose-Capillary: 67 mg/dL (ref 65–99)
Glucose-Capillary: 78 mg/dL (ref 65–99)
Glucose-Capillary: 113 mg/dL — ABNORMAL HIGH (ref 65–99)

## 2015-11-04 LAB — FOLATE RBC
FOLATE, HEMOLYSATE: 582.8 ng/mL
Folate, RBC: 2268 ng/mL (ref >498)
HEMATOCRIT: 25.7 % — AB (ref 37.5–51.0)

## 2015-11-04 SURGERY — COLONOSCOPY
Anesthesia: Monitor Anesthesia Care

## 2015-11-04 MED ORDER — EPHEDRINE SULFATE 50 MG/ML IJ SOLN
INTRAMUSCULAR | Status: DC | PRN
  Administered 2015-11-04: 10 mg via INTRAVENOUS

## 2015-11-04 MED ORDER — CALCITRIOL 0.5 MCG PO CAPS
ORAL_CAPSULE | ORAL | Status: AC
  Administered 2015-11-04: 0.5 ug via ORAL
  Filled 2015-11-04: qty 1

## 2015-11-04 MED ORDER — SODIUM CHLORIDE 0.9 % IV SOLN
INTRAVENOUS | Status: DC | PRN
  Administered 2015-11-04: 12:00:00 via INTRAVENOUS

## 2015-11-04 MED ORDER — DARBEPOETIN ALFA 100 MCG/0.5ML IJ SOSY
PREFILLED_SYRINGE | INTRAMUSCULAR | Status: AC
  Administered 2015-11-04: 100 ug
  Filled 2015-11-04: qty 0.5

## 2015-11-04 MED ORDER — PROPOFOL 500 MG/50ML IV EMUL
INTRAVENOUS | Status: DC | PRN
  Administered 2015-11-04: 100 ug/kg/min via INTRAVENOUS

## 2015-11-04 MED ORDER — DIATRIZOATE MEGLUMINE & SODIUM 66-10 % PO SOLN
ORAL | Status: AC
  Filled 2015-11-04: qty 30

## 2015-11-04 MED ORDER — PHENYLEPHRINE HCL 10 MG/ML IJ SOLN
INTRAMUSCULAR | Status: DC | PRN
  Administered 2015-11-04: 80 ug via INTRAVENOUS

## 2015-11-04 MED ORDER — IOPAMIDOL (ISOVUE-300) INJECTION 61%
INTRAVENOUS | Status: AC
  Administered 2015-11-04: 100 mL
  Filled 2015-11-04: qty 100

## 2015-11-04 MED ORDER — MIDODRINE HCL 5 MG PO TABS
ORAL_TABLET | ORAL | Status: AC
  Filled 2015-11-04: qty 2

## 2015-11-04 MED ORDER — MIDODRINE HCL 5 MG PO TABS
ORAL_TABLET | ORAL | Status: AC
  Administered 2015-11-04: 10 mg via ORAL
  Filled 2015-11-04: qty 2

## 2015-11-04 NOTE — Anesthesia Preprocedure Evaluation (Addendum)
Anesthesia Evaluation  Patient identified by MRN, date of birth, ID band Patient awake    Reviewed: Allergy & Precautions, NPO status , Patient's Chart, lab work & pertinent test results  Airway Mallampati: II  TM Distance: >3 FB Neck ROM: Full    Dental  (+) Teeth Intact, Dental Advisory Given   Pulmonary pneumonia,    breath sounds clear to auscultation       Cardiovascular hypertension, + CAD, + CABG and + Peripheral Vascular Disease  + dysrhythmias  Rhythm:Regular Rate:Normal     Neuro/Psych PSYCHIATRIC DISORDERS Depression negative neurological ROS     GI/Hepatic negative GI ROS, Neg liver ROS,   Endo/Other  diabetes, Type 2, Insulin Dependent  Renal/GU ESRF and DialysisRenal disease  negative genitourinary   Musculoskeletal  (+) Arthritis ,   Abdominal   Peds negative pediatric ROS (+)  Hematology  (+) anemia , Hgb 8.2   Anesthesia Other Findings - Blind (both eyes) - HLD   Reproductive/Obstetrics negative OB ROS                           Anesthesia Physical  Anesthesia Plan  ASA: III  Anesthesia Plan: MAC   Post-op Pain Management:    Induction: Intravenous  Airway Management Planned: Nasal Cannula, Natural Airway and Simple Face Mask  Additional Equipment:   Intra-op Plan:   Post-operative Plan:   Informed Consent: I have reviewed the patients History and Physical, chart, labs and discussed the procedure including the risks, benefits and alternatives for the proposed anesthesia with the patient or authorized representative who has indicated his/her understanding and acceptance.   Dental advisory given  Plan Discussed with: CRNA, Anesthesiologist and Surgeon  Anesthesia Plan Comments:         Anesthesia Quick Evaluation

## 2015-11-04 NOTE — Progress Notes (Signed)
First dose of PO contrast for CT Abd/Pelvis given to patient at 1415.

## 2015-11-04 NOTE — Anesthesia Procedure Notes (Signed)
Procedure Name: MAC Date/Time: 11/04/2015 12:21 PM Performed by: Babs Bertin Pre-anesthesia Checklist: Patient identified, Emergency Drugs available, Suction available, Patient being monitored and Timeout performed Patient Re-evaluated:Patient Re-evaluated prior to inductionOxygen Delivery Method: Nasal cannula

## 2015-11-04 NOTE — Op Note (Signed)
[facility] Patient Name: [name] Procedure Date : 11/04/2015 MRN: [id number] Attending MD: Carlota Raspberry. Havery Moros , MD Date of Birth: 1950-06-09 CSN: [id number] Age: 65 Admit Type: Inpatient Procedure:                Colonoscopy Indications:              Abnormal CT of the GI tract, concerning for                            "thickening" of ascending colon, rule out malignancy Providers:                Carlota Raspberry. Havery Moros, MD, Dortha Schwalbe RN, RN,                            Ralene Bathe, Technician, [name], CRNA Referring MD:              Medicines:                Monitored Anesthesia Care Complications:            No immediate complications. Estimated blood loss:                            None. Estimated Blood Loss:     Estimated blood loss: none. Procedure:                Pre-Anesthesia Assessment:                           - Prior to the procedure, a History and Physical                            was performed, and patient medications and                            allergies were reviewed. The patient's tolerance of                            previous anesthesia was also reviewed. The risks                            and benefits of the procedure and the sedation                            options and risks were discussed with the patient.                            All questions were answered, and informed consent                            was obtained. Prior Anticoagulants: The patient has                            taken aspirin, last dose was 1 day prior to  procedure. ASA Grade Assessment: III - A patient                            with severe systemic disease. After reviewing the                            risks and benefits, the patient was deemed in                            satisfactory condition to undergo the procedure.                           After obtaining informed consent, the colonoscope           was passed under direct vision. Throughout the                            procedure, the patient's blood pressure, pulse, and                            oxygen saturations were monitored continuously. The                            [id number] scope was introduced through                            the anus and advanced to the the terminal ileum,                            with identification of the appendiceal orifice and                            IC valve. The colonoscopy was performed without                            difficulty. The patient tolerated the procedure                            well. The quality of the bowel preparation was                            unsatisfactory. The terminal ileum, ileocecal                            valve, appendiceal orifice, and rectum were                            photographed. Scope In: 12:28:00 PM Scope Out: 12:47:59 PM Scope Withdrawal Time: 0 hours 9 minutes 47 seconds  Total Procedure Duration: 0 hours 19 minutes 59 seconds  Findings:      The perianal and digital rectal examinations were normal.      A large amount of semi-liquid stool was found in the entire colon,       making visualization difficult. This exam was inadequate for a screening  exam.      The ascending colon and cecum was extensively lavaged to obtain adequate       views. It appeared normal without any mass lesions or inflammatory       changes. There was a suggestion of a few small polyps in the cecum but       were not removed given poor prep. The time was not taken to lavage the       entire colon to achieve adequate views but no obvious mass lesions or       large polyps were appreciated.      The terminal ileum appeared normal.      Non-bleeding internal hemorrhoids were found during retroflexion. Impression:               - Preparation of the colon was unsatisfactory, not                            adequate for a screening exam.                            - Stool in the entire examined colon making cecal                            intubation prolonged.                           - The ascending colon and cecum are normal                            following lavage - no pathology noted to correlate                            to CT findings, which I suspect is coming from the                            retroperitoneum.                           - The examined portion of the ileum was normal.                           - Non-bleeding internal hemorrhoids.                           - No specimens collected. Moderate Sedation:      No moderate sedation, case performed with MAC Recommendation:           - Return patient to hospital ward for ongoing care.                           - Resume previous diet.                           - Continue present medications.                           - NO pathology in the right colon to  account for CT                            findings - no mass or inflammatory changes, defer                            to primary team for further workup. Recommend                            Urology consult.                           - GI will sign off for now, please call with any                            additional questions.                           - Repeat colonoscopy at previously recommended                            interval for screening purposes. Procedure Code(s):        --- Professional ---                           [phone], Colonoscopy, flexible; diagnostic, including                            collection of specimen(s) by brushing or washing,                            when performed (separate procedure) Diagnosis Code(s):        --- Professional ---                           R93.3, Abnormal findings on diagnostic imaging of                            other parts of digestive tract CPT copyright 2016 [employer]. All rights reserved. The codes documented in this report are  preliminary and upon coder review may  be revised to meet current compliance requirements. Remo Lipps P. Waynette Towers, MD 11/04/2015 1:01:34 PM This report has been signed electronically. Number of Addenda: 0

## 2015-11-04 NOTE — Progress Notes (Signed)
Patient received second dose of PO contrast at 1530.

## 2015-11-04 NOTE — Plan of Care (Signed)
Problem: Safety: Goal: Ability to remain free from injury will improve Outcome: Progressing Bed alarm in place. Instructing patient to call for assistance before getting out of bed.  Problem: Activity: Goal: Risk for activity intolerance will decrease Outcome: Progressing PT/OT consulted.

## 2015-11-04 NOTE — Procedures (Signed)
Patient was seen on dialysis and the procedure was supervised.  BFR 400  Via AVF BP is  93/58.   Patient appears to be tolerating treatment well  [name] A 11/04/2015

## 2015-11-04 NOTE — Progress Notes (Signed)
[facility] PROGRESS NOTE  [name] [id number] DOB: 29-Dec-1950 DOA: 11/01/2015   PCP: [name], MD     Assessment/Plan: Principal Problem:   Sepsis [facility]) Active Problems:   CAD, s/p CABG 2013   ESRD (end stage renal disease) on dialysis ([place])   Renal carcinoma ([place])   DM type 2 (diabetes mellitus, type 2) ([place])   Bilateral blindness   End stage renal failure on dialysis ([place])   HLD (hyperlipidemia)   Abdominal pain   UTI (lower urinary tract infection)   Abnormal CT of the abdomen   65 y.o. male with medical history significant of end-stage renal disease on dialysis (MWF), CAD, s/p CABG 2013, hyperlipidemia, kidney cancer status post right renal resection (S/p right nephrectomy in 01/2015. post-operative course c/b recurrent abscesses between the nephrectomy and abdominal wall. S/p RLQ drain 07/2015 and treated with Vanc/ Cefepime/ Flagyl. rains removed in 09/03/15), r, prostate cancer, hypotension, arthritis, anemia, bilateral blindness, hyperlipidemia, who presents with fever, chills, abdominal pain.CT abdomen/pelvis showed circumferential thickening in the ascending colon with some surrounding inflammatory changes, cannot rule out underlying neoplasm    Assessment and plan Sepsis ([place]) with gram-negative rods: pt is septic with elevated lactate and fever.   . The source of infection is likely intra-abdominal, colonic versus renal abscess / UTI. Given his recent postop infection with nephrostomy tube that was pulled 6 weeks ago, CT-abdomen/pelvis, which did not show abscess. However this was done without contrast, repeat CT scan with contrast   to rule out perinephric abscess or pelvic abscess is still pending  Continue -IV   Zosyn day #3 , discontinue vancomycin -f/u Bx and Ux   Procalcitonin 45.38 Blood culture positive for gram-negative rods-Escherichia coli GI feels  CT thickening of the colon may more likely represent underdistension or secondary  inflammatory changes from the inflammation in the retroperitoneum, and less likely a primary colon cancer or colitis,  Urology consult requested to evaluate pt for gram-negative sepsis , this is strongly recommended by GI Dr. Alinda Money agrees that a repeat CT scan is indicated  Mild UTI-continue antibiotics as above  Hx of Hypotension: -continue Midodrine, dose increased  CAD: s/p CABG. No chest pain. -continue ASA and Lipitor  Renal carcinoma [facility]): s/p of R nephrectomy, complicated by postop infection with nephrostomy tube that was pulled 6 weeks ago. CT-abdomen/pelvis has no new abscess formation.    ESRD (end stage renal disease) on dialysis (MWF): potassium 0.6, bicarbonate 27, creatinine 6.99, BUN 86. -left message to renal box for HD -continue Sensipar, Renvela and Calcitriol  Diet controlled DM-II: Last A1c 5.2, controled. Patient is not taking meds at home -SSI  HLD: Last LDL was 32 on 09/28/11 -Continue home medications: Lipitor  Abdominal pain: Etiology is not clear. Lipase 28. CT abdomen/pelvis did not show new abscess, but showed showed circumferential thickening in the ascending colon with some surrounding inflammatory changes. The wall thickening has increased in the interval from the prior exam. This raises suspicion for possible underlying neoplasm per radiologist. Soft tissue density adjacent to the hepatic flexure and tip of the liver. Given the patient's clinical history the possibility of local recurrence of previously resected renal carcinoma. Vs  Primary colon cancer,  patient  seen by gastroenterology, scheduled for colonoscopy today, GI consult feels soft tissue density in the retroperitoneum could be renal in origin, will repeat CT scan with contrast to further evaluate, also requested neurology to consult    Anemia of chronic disease/thrombocytopenia  No evidence of GI bleeding, platelets down likely secondary to sepsis Discontinue heparin , repeat CBC shows  platelets of 60   DVT prophylaxsis heparin   Discontinued due to thrombocytopenia   Code Status:  Full code     Family Communication: Discussed in detail with the patient, all imaging results, lab results explained to the patient    Disposition Plan: Urology consult    Procedures:  None    Antibiotics: Anti-infectives    Start     Dose/Rate Route Frequency Ordered Stop   11/02/15 1200  vancomycin (VANCOCIN) IVPB 1000 mg/200 mL premix  Status:  Discontinued     1,000 mg 200 mL/hr over 60 Minutes Intravenous Every M-W-F (Hemodialysis) 11/02/15 1036 11/03/15 0942   11/01/15 2200  piperacillin-tazobactam (ZOSYN) IVPB 2.25 g     2.25 g 100 mL/hr over 30 Minutes Intravenous Every 8 hours 11/01/15 1821        HPI/Subjective: Seen in hemodialysis, afebrile overnight, denies any nausea vomiting abdominal pain  Objective: Vitals:   11/04/15 0453 11/04/15 0645 11/04/15 0704 11/04/15 0730  BP: 122/65 130/66 130/61 (!) 100/50  Pulse: (!) 51 (!) 53 (!) 51 (!) 49  Resp: 16 19    Temp: 97.5 F (36.4 C) 98.1 F (36.7 C)    TempSrc: Oral Oral    SpO2: 100% 99%    Weight: 94.3 kg (208 lb) 93.8 kg (206 lb 12.7 oz)    Height: 6\' 4"  (1.93 m)       Intake/Output Summary (Last 24 hours) at 11/04/15 [id number] Last data filed at 11/03/15 1300  Gross per 24 hour  Intake              240 ml  Output                0 ml  Net              240 ml    Exam:  Examination:  General exam: Appears calm and comfortable  Respiratory system: Clear to auscultation. Respiratory effort normal. Cardiovascular system: S1 & S2 heard, RRR. No JVD, murmurs, rubs, gallops or clicks. No pedal edema. Gastrointestinal system: Abdomen is nondistended, soft and nontender. No organomegaly or masses felt. Normal bowel sounds heard. Central nervous system: Alert and oriented. No focal neurological deficits. Extremities: Symmetric 5 x 5 power. Skin: No rashes, lesions or ulcers Psychiatry: Judgement and  insight appear normal. Mood & affect appropriate.     Data Reviewed: I have personally reviewed following labs and imaging studies  Micro Results Recent Results (from the past 240 hour(s))  Culture, blood (Routine x 2)     Status: Abnormal   Collection Time: 11/01/15  6:12 PM  Result Value Ref Range Status   Specimen Description BLOOD RIGHT FOREARM  Final   Special Requests BOTTLES DRAWN AEROBIC AND ANAEROBIC 5CC  Final   Culture  Setup Time   Final    GRAM NEGATIVE RODS AEROBIC BOTTLE ONLY CRITICAL RESULT CALLED TO, READ BACK BY AND VERIFIED WITH: M TURNER 11/02/15 @ 81 M VESTAL    Culture ESCHERICHIA COLI (A)  Final   Report Status 11/04/2015 FINAL  Final   Organism ID, Bacteria ESCHERICHIA COLI  Final      Susceptibility   Escherichia coli - MIC*    AMPICILLIN <=2 SENSITIVE Sensitive     CEFAZOLIN <=4 SENSITIVE Sensitive     CEFEPIME <=1 SENSITIVE Sensitive     CEFTAZIDIME <=1 SENSITIVE Sensitive     CEFTRIAXONE <=  1 SENSITIVE Sensitive     CIPROFLOXACIN >=4 RESISTANT Resistant     GENTAMICIN <=1 SENSITIVE Sensitive     IMIPENEM <=0.25 SENSITIVE Sensitive     TRIMETH/SULFA <=20 SENSITIVE Sensitive     AMPICILLIN/SULBACTAM <=2 SENSITIVE Sensitive     PIP/TAZO <=4 SENSITIVE Sensitive     Extended ESBL NEGATIVE Sensitive     * ESCHERICHIA COLI  Blood Culture ID Panel (Reflexed)     Status: Abnormal   Collection Time: 11/01/15  6:12 PM  Result Value Ref Range Status   Enterococcus species NOT DETECTED NOT DETECTED Final   Vancomycin resistance NOT DETECTED NOT DETECTED Final   Listeria monocytogenes NOT DETECTED NOT DETECTED Final   Staphylococcus species NOT DETECTED NOT DETECTED Final   Staphylococcus aureus NOT DETECTED NOT DETECTED Final   Methicillin resistance NOT DETECTED NOT DETECTED Final   Streptococcus species NOT DETECTED NOT DETECTED Final   Streptococcus agalactiae NOT DETECTED NOT DETECTED Final   Streptococcus pneumoniae NOT DETECTED NOT DETECTED Final    Streptococcus pyogenes NOT DETECTED NOT DETECTED Final   Acinetobacter baumannii NOT DETECTED NOT DETECTED Final   Enterobacteriaceae species DETECTED (A) NOT DETECTED Final    Comment: CRITICAL RESULT CALLED TO, READ BACK BY AND VERIFIED WITH: M TURNER 11/02/15 @ 1143 M VESTAL    Enterobacter cloacae complex NOT DETECTED NOT DETECTED Final   Escherichia coli DETECTED (A) NOT DETECTED Final    Comment: CRITICAL RESULT CALLED TO, READ BACK BY AND VERIFIED WITH: M TURNER 11/02/15 @ 22 M VESTAL    Klebsiella oxytoca NOT DETECTED NOT DETECTED Final   Klebsiella pneumoniae NOT DETECTED NOT DETECTED Final   Proteus species NOT DETECTED NOT DETECTED Final   Serratia marcescens NOT DETECTED NOT DETECTED Final   Carbapenem resistance NOT DETECTED NOT DETECTED Final   Haemophilus influenzae NOT DETECTED NOT DETECTED Final   Neisseria meningitidis NOT DETECTED NOT DETECTED Final   Pseudomonas aeruginosa NOT DETECTED NOT DETECTED Final   Candida albicans NOT DETECTED NOT DETECTED Final   Candida glabrata NOT DETECTED NOT DETECTED Final   Candida krusei NOT DETECTED NOT DETECTED Final   Candida parapsilosis NOT DETECTED NOT DETECTED Final   Candida tropicalis NOT DETECTED NOT DETECTED Final  Culture, blood (Routine x 2)     Status: None (Preliminary result)   Collection Time: 11/01/15  6:30 PM  Result Value Ref Range Status   Specimen Description BLOOD RIGHT HAND  Final   Special Requests BOTTLES DRAWN AEROBIC AND ANAEROBIC 5CC  Final   Culture NO GROWTH 2 DAYS  Final   Report Status PENDING  Incomplete  Urine culture     Status: None   Collection Time: 11/01/15  7:17 PM  Result Value Ref Range Status   Specimen Description URINE, RANDOM  Final   Special Requests NONE  Final   Culture NO GROWTH  Final   Report Status 11/02/2015 FINAL  Final  MRSA PCR Screening     Status: None   Collection Time: 11/02/15 12:09 AM  Result Value Ref Range Status   MRSA by PCR NEGATIVE NEGATIVE Final     Comment:        The GeneXpert MRSA Assay (FDA approved for NASAL specimens only), is one component of a comprehensive MRSA colonization surveillance program. It is not intended to diagnose MRSA infection nor to guide or monitor treatment for MRSA infections.     Radiology Reports Ct Abdomen Pelvis Wo Contrast  Result Date: 11/01/2015 CLINICAL DATA:  Fever and chills  EXAM: CT ABDOMEN AND PELVIS WITHOUT CONTRAST TECHNIQUE: Multidetector CT imaging of the abdomen and pelvis was performed following the standard protocol without IV contrast. COMPARISON:  05/11/2015 FINDINGS: Lower chest:  Minimal right basilar atelectasis is noted. Hepatobiliary: Gallbladder has been surgically removed. The liver is within normal limits Pancreas: No mass or inflammatory process identified on this un-enhanced exam. Spleen: Within normal limits in size. Adrenals/Urinary Tract: The adrenal glands are within normal limits. Changes of prior right nephrectomy are noted. Renal cystic changes are noted on the left stable from the prior study. Stomach/Bowel: Prominent fecal material is noted within the rectum which may represent a degree of impaction. No obstructive changes are seen. Inflammatory changes are again noted in the region of the ascending colon. Adjacent to the hepatic flexure and tip of the liver there is a 3.6 cm mildly enhancing soft tissue lesion which appears somewhat larger than that seen on the prior exam. This may represent sequelae from the prior infection The previously seen changes extending through the abdominal wall on the right have resolved. The appendix is within normal limits. Vascular/Lymphatic: No pathologically enlarged lymph nodes. No evidence of abdominal aortic aneurysm. Reproductive: Brachytherapy seeds are noted within the prostate. Other: None. Musculoskeletal: Postsurgical changes are noted in the proximal left femur. Degenerative changes of the lumbar spine are seen. IMPRESSION:  Circumferential thickening in the ascending colon with some surrounding inflammatory changes. The wall thickening has increased in the interval from the prior exam. This raises suspicion for possible underlying neoplasm. Direct visualization is recommended. Soft tissue density adjacent to the hepatic flexure and tip of the liver. Given the patient's clinical history the possibility of local recurrence of previously resected renal carcinoma. Resolution of previously seen inflammatory changes extending to the right abdominal wall. Changes of prior nephrectomy. No other acute abnormality is seen. Electronically Signed   By: Inez Catalina M.D.   On: 11/01/2015 19:39  Dg Chest Portable 1 View  Result Date: 11/01/2015 CLINICAL DATA:  Fever and chills EXAM: PORTABLE CHEST 1 VIEW COMPARISON:  05/11/2015 FINDINGS: Chronic elevation of the right diaphragm. Normal heart size and mediastinal contours status post CABG. There is no edema, consolidation, effusion, or pneumothorax. IMPRESSION: Negative for pneumonia. Electronically Signed   By: Monte Fantasia M.D.   On: 11/01/2015 19:10    CBC  Recent Labs Lab 11/02/15 0436 11/04/15 0705  WBC 5.0 2.5*  HGB 8.2* 8.7*  HCT 25.4* 25.9*  PLT 59* 60*  MCV 106.3* 102.4*  MCH 34.3* 34.4*  MCHC 32.3 33.6  RDW 14.3 13.9    Chemistries   Recent Labs Lab 11/01/15 1812 11/02/15 0436 11/03/15 0553 11/04/15 0705  NA 144 145 140 142  K 3.6 3.7 4.1 4.6  CL 103 106 100* 102  CO2 27 28 30 25   GLUCOSE 131* 132* 82 86  BUN 86* 90* 39* 48*  CREATININE 6.99* 7.68* 4.78* 6.11*  CALCIUM 9.6 9.3 9.5 9.3  AST  --   --  39 38  ALT  --   --  27 31  ALKPHOS  --   --  92 102  BILITOT  --   --  1.1 1.2   ------------------------------------------------------------------------------------------------------------------ estimated creatinine clearance is 14.6 mL/min (by C-G formula based on SCr of 6.11  mg/dL). ------------------------------------------------------------------------------------------------------------------ No results for input(s): HGBA1C in the last 72 hours. ------------------------------------------------------------------------------------------------------------------ No results for input(s): CHOL, HDL, LDLCALC, TRIG, CHOLHDL, LDLDIRECT in the last 72 hours. ------------------------------------------------------------------------------------------------------------------ No results for input(s): TSH, T4TOTAL, T3FREE, THYROIDAB in the  last 72 hours.  Invalid input(s): FREET3 ------------------------------------------------------------------------------------------------------------------  Recent Labs  11/03/15 0553  VITAMINB12 912    Coagulation profile  Recent Labs Lab 11/01/15 2200  INR 1.12    No results for input(s): DDIMER in the last 72 hours.  Cardiac Enzymes No results for input(s): CKMB, TROPONINI, MYOGLOBIN in the last 168 hours.  Invalid input(s): CK ------------------------------------------------------------------------------------------------------------------ Invalid input(s): POCBNP   CBG:  Recent Labs Lab 11/03/15 0753 11/03/15 1212 11/03/15 1251 11/03/15 1658 11/03/15 2116  GLUCAP 72 64* 102* 140* 77       Studies: No results found.    Lab Results  Component Value Date   HGBA1C 5.2 02/13/2015   HGBA1C 5.6 07/28/2014   HGBA1C 6.0 (H) 09/28/2011   Lab Results  Component Value Date   LDLCALC 32 09/28/2011   CREATININE 6.11 (H) 11/04/2015       Scheduled Meds: . acidophilus  1 capsule Oral Daily  . aspirin EC  81 mg Oral QHS  . atorvastatin  10 mg Oral QHS  . calcitRIOL  0.5 mcg Oral Q M,W,F-HD  . cinacalcet  30 mg Oral Q supper  . darbepoetin (ARANESP) injection - DIALYSIS  100 mcg Intravenous Q Wed-HD  . insulin aspart  0-5 Units Subcutaneous QHS  . insulin aspart  0-9 Units Subcutaneous TID WC  .  midodrine      . midodrine  10 mg Oral 3 times per day  . multivitamin  1 tablet Oral QHS  . piperacillin-tazobactam (ZOSYN)  IV  2.25 g Intravenous Q8H  . [START ON 11/05/2015] polyethylene glycol  17 g Oral Daily  . sevelamer carbonate  4,000 mg Oral TID WC  . sodium chloride  250 mL Intravenous Once  . sodium chloride flush  3 mL Intravenous Q12H   Continuous Infusions:    LOS: 1 day    Time spent: >30 MINS    [facility]  [facility] Pager [phone]. If 7PM-7AM, please contact night-coverage at [email or web address], password [facility] 11/04/2015, 9:07 AM  LOS: 1 day

## 2015-11-04 NOTE — Consult Note (Addendum)
Urology Consult   Physician requesting consult: Dr. Allyson Sabal  Reason for consult: Sepsis, Question GU source  History of Present Illness: [name] is a 65 y.o. previously followed by Dr. [name] with ESRD and a past GU history of bilateral renal cell carcinoma s/p percutaneous ablation in the distant past and prostate cancer s/p brachytherapy.  He has been evaluated at [facility] for a possible renal transplantation.  During his evaluation, he was noted to have a concerning recurrent mass on the right kidney.  He underwent a right laparoscopic converted to open radical nephrectomy at [facility] by the Transplant service. According to his wife, his pathology did NOT demonstrate recurrent malignancy.  His postoperative course was complicated by an abscess in the right renal fossa in February requiring antibiotic therapy and percutaneous drainage.  His drain was removed in early June.  He was afebrile and otherwise doing well the past 2 months until this past Sunday when he became febrile to 103. He has had positive blood cultures with E coli without a clear source found.  Urine culture from 7/28 was negative.  He has been receiving hemodialysis and also underwent colonoscopy today.  A CT without contrast was performed on 7/30 and there was a 3.7 cm hyperdense mass in the renal fossa of unclear significance.  A subsequent CT with contrast was performed on 8/2 and no abscess was noted with a stable 3.7 cm mass in the renal fossa.   Past Medical History:  Diagnosis Date  . Anemia   . Arthritis    "back, neck" (07/28/2014)  . Blind    both eyes removed   . Bruises easily   . CAD, NATIVE VESSEL 01/08/2008      . Cellulitis late 1980's   "hospitalized; wrapped both legs; several times; no OR for this"  . Colon polyps   . DM type 2 (diabetes mellitus, type 2) ([place])    no medications (07/28/2014)  . ESRD (end stage renal disease) on dialysis [facility])    [name]; Mon, Wed, Fri (07/28/2014)  . Family history of  breast cancer    mother  . HYPERLIPIDEMIA-MIXED 01/08/2008  . HYPERTENSION 01/27/2009   BP low  . Hypotension   . Kidney stones   . Low blood pressure   . OVERWEIGHT/OBESITY 01/08/2008   Lost 205 lbs through diet and exercise.    . Peripheral vascular disease ([place])   . Pneumonia 2000;s X 1  . Prostate cancer ([place])   . Prosthetic eye globe    both eyes  . Renal cell carcinoma 2001 and 2003   "both kidneys"  . Renal insufficiency     Past Surgical History:  Procedure Laterality Date  . AV FISTULA PLACEMENT Left 02/2010   LFA  . CHOLECYSTECTOMY OPEN  1992  . COLONOSCOPY  06/14/2011   Procedure: COLONOSCOPY;  Surgeon: [name], MD;  Location: [facility] ENDOSCOPY;  Service: Endoscopy;  Laterality: N/A;  . COLONOSCOPY N/A 07/24/2012   Procedure: COLONOSCOPY;  Surgeon: [name], MD;  Location: [facility] ENDOSCOPY;  Service: Endoscopy;  Laterality: N/A;  . COLONOSCOPY    . COLONOSCOPY N/A 11/04/2015   Procedure: COLONOSCOPY;  Surgeon: Manus Gunning, MD;  Location: [facility] ENDOSCOPY;  Service: Gastroenterology;  Laterality: N/A;  . COLONOSCOPY WITH PROPOFOL N/A 05/13/2014   Procedure: COLONOSCOPY WITH PROPOFOL;  Surgeon: [name], MD;  Location: [facility] ENDOSCOPY;  Service: Endoscopy;  Laterality: N/A;  . CORONARY ARTERY BYPASS GRAFT  09/29/2011   Procedure: CORONARY ARTERY BYPASS  GRAFTING (CABG);  Surgeon: [name], MD;  Location: [place];  Service: Open Heart Surgery;  Laterality: N/A;  Coronary Artery Bypass Graft times two utilizing the left internal mammary artery and the right greater saphenous vein harvested endoscopically.  . CYSTOSCOPY WITH RETROGRADE PYELOGRAM, URETEROSCOPY AND STENT PLACEMENT Left 07/28/2014   Procedure: CYSTOSCOPY WITH RETROGRADE PYELOGRAM, URETERAL BALLOON DILITATION, URETEROSCOPY AND LEFT STENT PLACEMENT;  Surgeon: [name], MD;  Location: [facility] ORS;  Service: Urology;  Laterality: Left;  . ENUCLEATION  2003; 2006   bilateral; "diabetes; pain"  . FOOT  AMPUTATION Right ~ 2002   right;  partial; "infection"  . FRACTURE SURGERY    . HIP PINNING,CANNULATED Left 12/31/2013   Procedure: CANNULATED HIP PINNING;  Surgeon: Renette Butters, MD;  Location: [place];  Service: Orthopedics;  Laterality: Left;  Carm, FX Table, [employer]  . HOT HEMOSTASIS  06/14/2011   Procedure: HOT HEMOSTASIS (ARGON PLASMA COAGULATION/BICAP);  Surgeon: [name], MD;  Location: [facility];  Service: Endoscopy;  Laterality: N/A;  . INSERTION PROSTATE RADIATION SEED  03/2012  . LEFT HEART CATHETERIZATION WITH CORONARY ANGIOGRAM N/A 09/27/2011   Procedure: LEFT HEART CATHETERIZATION WITH CORONARY ANGIOGRAM;  Surgeon: [name], MD;  Location: [facility] CATH LAB;  Service: Cardiovascular;  Laterality: N/A;  . LIGATION OF COMPETING BRANCHES OF ARTERIOVENOUS FISTULA Left 07/29/2014   Procedure: LIGATION OF COMPETING BRANCHES OF LEFT ARM ARTERIOVENOUS FISTULA;  Surgeon: [name], MD;  Location: [place];  Service: Vascular;  Laterality: Left;  . NEPHRECTOMY Right 01/30/2015   done at [place]  . RADIOFREQUENCY ABLATION KIDNEY  2010-2012   "twice; one on each side; for cancer"  . REVISON OF ARTERIOVENOUS FISTULA Left 09/01/2015   Procedure: EXPLORATION LEFT LOWER ARM  RADIOCEPHALIC ARTERIOVENOUS FISTULA;  Surgeon: Conrad Claude, MD;  Location: [place];  Service: Vascular;  Laterality: Left;  [name] VARICOSE VEIN SURGERY  mid 1980's    BLE; "knees down; both legs; 2 separate times"    Current Hospital Medications:   Scheduled Meds: . acidophilus  1 capsule Oral Daily  . aspirin EC  81 mg Oral QHS  . atorvastatin  10 mg Oral QHS  . calcitRIOL  0.5 mcg Oral Q M,W,F-HD  . cinacalcet  30 mg Oral Q supper  . darbepoetin (ARANESP) injection - DIALYSIS  100 mcg Intravenous Q Wed-HD  . diatrizoate meglumine-sodium      . insulin aspart  0-5 Units Subcutaneous QHS  . insulin aspart  0-9 Units Subcutaneous TID WC  . midodrine  10 mg Oral 3 times per day  . multivitamin  1  tablet Oral QHS  . piperacillin-tazobactam (ZOSYN)  IV  2.25 g Intravenous Q8H  . [START ON 11/05/2015] polyethylene glycol  17 g Oral Daily  . sevelamer carbonate  4,000 mg Oral TID WC  . sodium chloride  250 mL Intravenous Once  . sodium chloride flush  3 mL Intravenous Q12H   Continuous Infusions:  PRN Meds:.acetaminophen **OR** acetaminophen, hydrOXYzine, morphine injection, sevelamer carbonate, sodium chloride  Allergies:  Allergies  Allergen Reactions  . Codeine Other (See Comments)    Makes patient incoherent. STATES MAKES HIM COMATOSE  . Tape Other (See Comments)    Plastic tape tears skin off, please use paper tape instead.    Family History  Problem Relation Age of Onset  . Cancer Mother 47    breast, spine and ovarian  . Heart disease Mother   . Hyperlipidemia Mother   . Hypertension  Mother   . Varicose Veins Mother   . Emphysema Father   . Cancer Father 51    kidney, prostate  . Heart disease Father   . Hyperlipidemia Father   . Hypertension Father   . Cancer Sister     ovarian  . Cancer Brother     lung  . Heart disease Brother   . Hyperlipidemia Brother   . Hypertension Brother   . Heart attack Brother   . Peripheral vascular disease Brother   . Cancer    . Coronary artery disease    . Kidney disease    . Breast cancer    . Ovarian cancer    . Lung cancer    . Diabetes    . Malignant hyperthermia Neg Hx     Social History:  reports that he has never smoked. He has never used smokeless tobacco. He reports that he does not drink alcohol or use drugs.  ROS: A complete review of systems was performed.  All systems are negative except for pertinent findings as noted.  Physical Exam:  Vital signs in last 24 hours: Temp:  [97.4 F (36.3 C)-98.5 F (36.9 C)] 98.4 F (36.9 C) (08/02 1654) Pulse Rate:  [49-90] 58 (08/02 1654) Resp:  [12-19] 18 (08/02 1654) BP: (93-137)/(50-88) 122/62 (08/02 1654) SpO2:  [90 %-100 %] 98 % (08/02 1654) Weight:  [91.6  kg (201 lb 15.1 oz)-94.3 kg (208 lb)] 91.6 kg (201 lb 15.1 oz) (08/02 1117)  I attempted twice to exam the patient but he was in Endoscopy one time and in CT the other time.  Laboratory Data:   Recent Labs  11/02/15 0436 11/03/15 0553 11/04/15 0705 11/04/15 1153  WBC 5.0  --  2.5*  --   HGB 8.2*  --  8.7* 8.2*  HCT 25.4* 25.7* 25.9* 24.0*  PLT 59*  --  60*  --      Recent Labs  11/02/15 0436 11/03/15 0553 11/04/15 0705 11/04/15 1153  NA 145 140 142 140  K 3.7 4.1 4.6 3.1*  CL 106 100* 102  --   GLUCOSE 132* 82 86 72  BUN 90* 39* 48*  --   CALCIUM 9.3 9.5 9.3  --   CREATININE 7.68* 4.78* 6.11*  --      Results for orders placed or performed during the hospital encounter of 11/01/15 (from the past 24 hour(s))  Glucose, capillary     Status: None   Collection Time: 11/03/15  9:16 PM  Result Value Ref Range   Glucose-Capillary 77 65 - 99 mg/dL   Comment 1 Notify RN   CBC     Status: Abnormal   Collection Time: 11/04/15  7:05 AM  Result Value Ref Range   WBC 2.5 (L) 4.0 - 10.5 K/uL   RBC 2.53 (L) 4.22 - 5.81 MIL/uL   Hemoglobin 8.7 (L) 13.0 - 17.0 g/dL   HCT 25.9 (L) 39.0 - 52.0 %   MCV 102.4 (H) 78.0 - 100.0 fL   MCH 34.4 (H) 26.0 - 34.0 pg   MCHC 33.6 30.0 - 36.0 g/dL   RDW 13.9 11.5 - 15.5 %   Platelets 60 (L) 150 - 400 K/uL  Comprehensive metabolic panel     Status: Abnormal   Collection Time: 11/04/15  7:05 AM  Result Value Ref Range   Sodium 142 135 - 145 mmol/L   Potassium 4.6 3.5 - 5.1 mmol/L   Chloride 102 101 - 111 mmol/L   CO2 25  22 - 32 mmol/L   Glucose, Bld 86 65 - 99 mg/dL   BUN 48 (H) 6 - 20 mg/dL   Creatinine, Ser 6.11 (H) 0.61 - 1.24 mg/dL   Calcium 9.3 8.9 - 10.3 mg/dL   Total Protein 5.3 (L) 6.5 - 8.1 g/dL   Albumin 2.9 (L) 3.5 - 5.0 g/dL   AST 38 15 - 41 U/L   ALT 31 17 - 63 U/L   Alkaline Phosphatase 102 38 - 126 U/L   Total Bilirubin 1.2 0.3 - 1.2 mg/dL   GFR calc non Af Amer 9 (L) >60 mL/min   GFR calc Af Amer 10 (L) >60 mL/min    Anion gap 15 5 - 15  Phosphorus     Status: Abnormal   Collection Time: 11/04/15  7:05 AM  Result Value Ref Range   Phosphorus 5.0 (H) 2.5 - 4.6 mg/dL  I-STAT 4, (NA,K, GLUC, HGB,HCT)     Status: Abnormal   Collection Time: 11/04/15 11:53 AM  Result Value Ref Range   Sodium 140 135 - 145 mmol/L   Potassium 3.1 (L) 3.5 - 5.1 mmol/L   Glucose, Bld 72 65 - 99 mg/dL   HCT 24.0 (L) 39.0 - 52.0 %   Hemoglobin 8.2 (L) 13.0 - 17.0 g/dL  Glucose, capillary     Status: None   Collection Time: 11/04/15  1:43 PM  Result Value Ref Range   Glucose-Capillary 67 65 - 99 mg/dL  Glucose, capillary     Status: None   Collection Time: 11/04/15  2:27 PM  Result Value Ref Range   Glucose-Capillary 78 65 - 99 mg/dL  Glucose, capillary     Status: None   Collection Time: 11/04/15  4:36 PM  Result Value Ref Range   Glucose-Capillary 82 65 - 99 mg/dL   Recent Results (from the past 240 hour(s))  Culture, blood (Routine x 2)     Status: Abnormal   Collection Time: 11/01/15  6:12 PM  Result Value Ref Range Status   Specimen Description BLOOD RIGHT FOREARM  Final   Special Requests BOTTLES DRAWN AEROBIC AND ANAEROBIC 5CC  Final   Culture  Setup Time   Final    GRAM NEGATIVE RODS AEROBIC BOTTLE ONLY CRITICAL RESULT CALLED TO, READ BACK BY AND VERIFIED WITH: M TURNER 11/02/15 @ 1143 M VESTAL    Culture ESCHERICHIA COLI (A)  Final   Report Status 11/04/2015 FINAL  Final   Organism ID, Bacteria ESCHERICHIA COLI  Final      Susceptibility   Escherichia coli - MIC*    AMPICILLIN <=2 SENSITIVE Sensitive     CEFAZOLIN <=4 SENSITIVE Sensitive     CEFEPIME <=1 SENSITIVE Sensitive     CEFTAZIDIME <=1 SENSITIVE Sensitive     CEFTRIAXONE <=1 SENSITIVE Sensitive     CIPROFLOXACIN >=4 RESISTANT Resistant     GENTAMICIN <=1 SENSITIVE Sensitive     IMIPENEM <=0.25 SENSITIVE Sensitive     TRIMETH/SULFA <=20 SENSITIVE Sensitive     AMPICILLIN/SULBACTAM <=2 SENSITIVE Sensitive     PIP/TAZO <=4 SENSITIVE  Sensitive     Extended ESBL NEGATIVE Sensitive     * ESCHERICHIA COLI  Blood Culture ID Panel (Reflexed)     Status: Abnormal   Collection Time: 11/01/15  6:12 PM  Result Value Ref Range Status   Enterococcus species NOT DETECTED NOT DETECTED Final   Vancomycin resistance NOT DETECTED NOT DETECTED Final   Listeria monocytogenes NOT DETECTED NOT DETECTED Final   Staphylococcus species  NOT DETECTED NOT DETECTED Final   Staphylococcus aureus NOT DETECTED NOT DETECTED Final   Methicillin resistance NOT DETECTED NOT DETECTED Final   Streptococcus species NOT DETECTED NOT DETECTED Final   Streptococcus agalactiae NOT DETECTED NOT DETECTED Final   Streptococcus pneumoniae NOT DETECTED NOT DETECTED Final   Streptococcus pyogenes NOT DETECTED NOT DETECTED Final   Acinetobacter baumannii NOT DETECTED NOT DETECTED Final   Enterobacteriaceae species DETECTED (A) NOT DETECTED Final    Comment: CRITICAL RESULT CALLED TO, READ BACK BY AND VERIFIED WITH: M TURNER 11/02/15 @ 51 M VESTAL    Enterobacter cloacae complex NOT DETECTED NOT DETECTED Final   Escherichia coli DETECTED (A) NOT DETECTED Final    Comment: CRITICAL RESULT CALLED TO, READ BACK BY AND VERIFIED WITH: M TURNER 11/02/15 @ 64 M VESTAL    Klebsiella oxytoca NOT DETECTED NOT DETECTED Final   Klebsiella pneumoniae NOT DETECTED NOT DETECTED Final   Proteus species NOT DETECTED NOT DETECTED Final   Serratia marcescens NOT DETECTED NOT DETECTED Final   Carbapenem resistance NOT DETECTED NOT DETECTED Final   Haemophilus influenzae NOT DETECTED NOT DETECTED Final   Neisseria meningitidis NOT DETECTED NOT DETECTED Final   Pseudomonas aeruginosa NOT DETECTED NOT DETECTED Final   Candida albicans NOT DETECTED NOT DETECTED Final   Candida glabrata NOT DETECTED NOT DETECTED Final   Candida krusei NOT DETECTED NOT DETECTED Final   Candida parapsilosis NOT DETECTED NOT DETECTED Final   Candida tropicalis NOT DETECTED NOT DETECTED Final   Culture, blood (Routine x 2)     Status: None (Preliminary result)   Collection Time: 11/01/15  6:30 PM  Result Value Ref Range Status   Specimen Description BLOOD RIGHT HAND  Final   Special Requests BOTTLES DRAWN AEROBIC AND ANAEROBIC 5CC  Final   Culture NO GROWTH 3 DAYS  Final   Report Status PENDING  Incomplete  Urine culture     Status: None   Collection Time: 11/01/15  7:17 PM  Result Value Ref Range Status   Specimen Description URINE, RANDOM  Final   Special Requests NONE  Final   Culture NO GROWTH  Final   Report Status 11/02/2015 FINAL  Final  MRSA PCR Screening     Status: None   Collection Time: 11/02/15 12:09 AM  Result Value Ref Range Status   MRSA by PCR NEGATIVE NEGATIVE Final    Comment:        The GeneXpert MRSA Assay (FDA approved for NASAL specimens only), is one component of a comprehensive MRSA colonization surveillance program. It is not intended to diagnose MRSA infection nor to guide or monitor treatment for MRSA infections.     Renal Function:  Recent Labs  11/01/15 1812 11/02/15 0436 11/03/15 0553 11/04/15 0705  CREATININE 6.99* 7.68* 4.78* 6.11*   Estimated Creatinine Clearance: 14.6 mL/min (by C-G formula based on SCr of 6.11 mg/dL).  Radiologic Imaging: No results found.  I independently reviewed the above imaging studies.  Impression/Recommendation E coli bacteremia:  No GU source is identified for his bacteremia.  His urine culture was negative.  Right renal fossa mass: I doubt this is malignant considering that his nephrectomy pathology was apparently benign according to his wife who I spoke with today.  This also does not appear to be an abscess but may be an area of scar related to healing of his prior abscess site.  He should follow up with his transplant surgeon at [facility] who performed his nephrectomy for further evaluation/surveillance of  this mass.  Repeat imaging in 3 months would be a reasonable approach.  Please call if  further questions.  Alita Waldren,LES 11/04/2015, 6:21 PM    Pryor Curia. MD   CC: Dr. Allyson Sabal

## 2015-11-04 NOTE — Consult Note (Signed)
Reason for Consult: To manage dialysis and dialysis related needs Referring Physician: Chesley Veasey is an 65 y.o. male with past medical history significant for diabetes mellitus been currently on no medications, coronary artery disease status post CABG in 2013, hyperlipidemia, prostate cancer status post seeds- and nephrectomy for renal cell carcinoma done in October 9604 which was complicated by a postop infection requiring a drainage tube was pulled 6-8 weeks ago. He also has ESRD, undergoes HD on Monday Wednesday Friday.  Patient was admitted on the evening of 7/30 with fever, chills and abdominal pain. CT scan showed a thickening of the colon with surrounding inflammation that was concerning and direct correlation was recommended. In the meantime, initial blood cultures grew out relatively sensitive Escherichia coli. Patient underwent colonoscopy today which did not visualize anything concerning although bowel prep was incomplete. The other possible etiology of the bacteremia is urologic in nature given his history. However, patient has improved remarkably on IV Zosyn   Dialysis Orders: Center: [facility]  on MWF . EDW 91 kg (but leaving below 90.3, 90.0, 90.1 recently) HD Bath 2k, 2ca  Time 4hr 82mn   (Dr. DKarleen Hampshire Incr past week sec. decr urr  With need new access  Pt wants time ^ First) Heparin 8000. Access R FA AVF BFR 450  DFR 800    With 200 Dialyzer    PO Calcitriol 0.5 mcg  q /HD  Mircera 50 mcg po q 2 weeks  Last on 10/21/15     Other  Op labs hgb 10.0 / 7/26  Cor ca 10 phos 6.3  pth 633   Past Medical History:  Diagnosis Date  . Anemia   . Arthritis    "back, neck" (07/28/2014)  . Blind    both eyes removed   . Bruises easily   . CAD, NATIVE VESSEL 01/08/2008      . Cellulitis late 1980's   "hospitalized; wrapped both legs; several times; no OR for this"  . Colon polyps   . DM type 2 (diabetes mellitus, type 2) ([facility]    no medications (07/28/2014)  . ESRD (end stage renal  disease) on dialysis ([facility]    H[facility]ene[facility] Rinks Mon, Wed, Fri (07/28/2014)  . Family history of breast cancer    mother  . HYPERLIPIDEMIA-MIXED 01/08/2008  . HYPERTENSION 01/27/2009   BP low  . Hypotension   . Kidney stones   . Low blood pressure   . OVERWEIGHT/OBESITY 01/08/2008   Lost 205 lbs through diet and exercise.    . Peripheral vascular disease ([facility]   . Pneumonia 2000;s X 1  . Prostate cancer ([facility]   . Prosthetic eye globe    both eyes  . Renal cell carcinoma 2001 and 2003   "both kidneys"  . Renal insufficiency     Past Surgical History:  Procedure Laterality Date  . AV FISTULA PLACEMENT Left 02/2010   LFA  . CHOLECYSTECTOMY OPEN  1992  . COLONOSCOPY  06/14/2011   Procedure: COLONOSCOPY;  Surgeon: RInda Castle MD;  [facility]: [facility];  Service: Endoscopy;  Laterality: N/A;  . COLONOSCOPY N/A 07/24/2012   Procedure: COLONOSCOPY;  Surgeon: RInda Castle MD;  Location[facility];  Service: Endoscopy;  Laterality: N/A;  . COLONOSCOPY    . COLONOSCOPY WITH PROPOFOL N/A 05/13/2014   Procedure: COLONOSCOPY WITH PROPOFOL;  Surgeon: RInda Castle MD;  [facility]: [facility];  Service: Endoscopy;  Laterality: N/A;  . CORONARY ARTERY BYPASS GRAFT  09/29/2011  Procedure: CORONARY ARTERY BYPASS GRAFTING (CABG);  Surgeon: [name], MD;  Location: [facility];  Service: Open Heart Surgery;  Laterality: N/A;  Coronary Artery Bypass Graft times two utilizing the left internal mammary artery and the right greater saphenous vein harvested endoscopically.  . CYSTOSCOPY WITH RETROGRADE PYELOGRAM, URETEROSCOPY AND STENT PLACEMENT Left 07/28/2014   Procedure: CYSTOSCOPY WITH RETROGRADE PYELOGRAM, URETERAL BALLOON DILITATION, URETEROSCOPY AND LEFT STENT PLACEMENT;  Surgeon: [name], MD;  Location: [facility];  Service: Urology;  Laterality: Left;  . ENUCLEATION  2003; 2006   bilateral; "diabetes; pain"  . FOOT AMPUTATION Right ~ 2002   right;  partial; "infection"  . FRACTURE SURGERY     . HIP PINNING,CANNULATED Left 12/31/2013   Procedure: CANNULATED HIP PINNING;  Surgeon: Renette Butters, MD;  Location: [facility];  Service: Orthopedics;  Laterality: Left;  Carm, FX Table, [employer]  . HOT HEMOSTASIS  06/14/2011   Procedure: HOT HEMOSTASIS (ARGON PLASMA COAGULATION/BICAP);  Surgeon: [name], MD;  [facility];  Service: Endoscopy;  Laterality: N/A;  . INSERTION PROSTATE RADIATION SEED  03/2012  . LEFT HEART CATHETERIZATION WITH CORONARY ANGIOGRAM N/A 09/27/2011   Procedure: LEFT HEART CATHETERIZATION WITH CORONARY ANGIOGRAM;  Surgeon: [name], MD;  Location: [facility] CATH LAB;  Service: Cardiovascular;  Laterality: N/A;  . LIGATION OF COMPETING BRANCHES OF ARTERIOVENOUS FISTULA Left 07/29/2014   Procedure: LIGATION OF COMPETING BRANCHES OF LEFT ARM ARTERIOVENOUS FISTULA;  Surgeon: [name], MD;  Location: [facility];  Service: Vascular;  Laterality: Left;  . NEPHRECTOMY Right 01/30/2015   done at [facility]  . RADIOFREQUENCY ABLATION KIDNEY  2010-2012   "twice; one on each side; for cancer"  . REVISON OF ARTERIOVENOUS FISTULA Left 09/01/2015   Procedure: EXPLORATION LEFT LOWER ARM  RADIOCEPHALIC ARTERIOVENOUS FISTULA;  Surgeon: Conrad Alden, MD;  Location: [facility];  Service: Vascular;  Laterality: Left;  [name] VARICOSE VEIN SURGERY  mid 1980's    BLE; "knees down; both legs; 2 separate times"    Family History  Problem Relation Age of Onset  . Cancer Mother 71    breast, spine and ovarian  . Heart disease Mother   . Hyperlipidemia Mother   . Hypertension Mother   . Varicose Veins Mother   . Emphysema Father   . Cancer Father 66    kidney, prostate  . Heart disease Father   . Hyperlipidemia Father   . Hypertension Father   . Cancer Sister     ovarian  . Cancer Brother     lung  . Heart disease Brother   . Hyperlipidemia Brother   . Hypertension Brother   . Heart attack Brother   . Peripheral vascular disease Brother   . Cancer    . Coronary  artery disease    . Kidney disease    . Breast cancer    . Ovarian cancer    . Lung cancer    . Diabetes    . Malignant hyperthermia Neg Hx     Social History:  reports that he has never smoked. He has never used smokeless tobacco. He reports that he does not drink alcohol or use drugs.  Allergies:  Allergies  Allergen Reactions  . Codeine Other (See Comments)    Makes patient incoherent. STATES MAKES HIM COMATOSE  . Tape Other (See Comments)    Plastic tape tears skin off, please use paper tape instead.    Medications: I have reviewed the patient's current medications.  Results for orders placed or performed during the hospital encounter of 11/01/15 (from the past 48 hour(s))  Glucose, capillary     Status: None   Collection Time: 11/02/15  6:21 PM  Result Value Ref Range   Glucose-Capillary 76 65 - 99 mg/dL  Glucose, capillary     Status: None   Collection Time: 11/02/15  8:21 PM  Result Value Ref Range   Glucose-Capillary 74 65 - 99 mg/dL  Comprehensive metabolic panel     Status: Abnormal   Collection Time: 11/03/15  5:53 AM  Result Value Ref Range   Sodium 140 135 - 145 mmol/L   Potassium 4.1 3.5 - 5.1 mmol/L   Chloride 100 (L) 101 - 111 mmol/L   CO2 30 22 - 32 mmol/L   Glucose, Bld 82 65 - 99 mg/dL   BUN 39 (H) 6 - 20 mg/dL   Creatinine, Ser 4.78 (H) 0.61 - 1.24 mg/dL    Comment: DELTA CHECK NOTED   Calcium 9.5 8.9 - 10.3 mg/dL   Total Protein 5.1 (L) 6.5 - 8.1 g/dL   Albumin 2.8 (L) 3.5 - 5.0 g/dL   AST 39 15 - 41 U/L   ALT 27 17 - 63 U/L   Alkaline Phosphatase 92 38 - 126 U/L   Total Bilirubin 1.1 0.3 - 1.2 mg/dL   GFR calc non Af Amer 12 (L) >60 mL/min   GFR calc Af Amer 13 (L) >60 mL/min    Comment: (NOTE) The eGFR has been calculated using the CKD EPI equation. This calculation has not been validated in all clinical situations. eGFR's persistently <60 mL/min signify possible Chronic Kidney Disease.    Anion gap 10 5 - 15  Vitamin B12     Status:  None   Collection Time: 11/03/15  5:53 AM  Result Value Ref Range   Vitamin B-12 912 180 - 914 pg/mL    Comment: (NOTE) This assay is not validated for testing neonatal or myeloproliferative syndrome specimens for Vitamin B12 levels.   Folate RBC     Status: Abnormal   Collection Time: 11/03/15  5:53 AM  Result Value Ref Range   Folate, Hemolysate 582.8 Not Estab. ng/mL   Hematocrit 25.7 (L) 37.5 - 51.0 %   Folate, RBC 2,268 >498 ng/mL    Comment: (NOTE) Performed At: [facility] [place], [place] [id number] Lindon Romp MD DZ:[id number]   Prealbumin     Status: None   Collection Time: 11/03/15  5:53 AM  Result Value Ref Range   Prealbumin 22.8 18 - 38 mg/dL  Glucose, capillary     Status: None   Collection Time: 11/03/15  7:53 AM  Result Value Ref Range   Glucose-Capillary 72 65 - 99 mg/dL  Glucose, capillary     Status: Abnormal   Collection Time: 11/03/15 12:12 PM  Result Value Ref Range   Glucose-Capillary 64 (L) 65 - 99 mg/dL   Comment 1 Notify RN   Glucose, capillary     Status: Abnormal   Collection Time: 11/03/15 12:51 PM  Result Value Ref Range   Glucose-Capillary 102 (H) 65 - 99 mg/dL  Glucose, capillary     Status: Abnormal   Collection Time: 11/03/15  4:58 PM  Result Value Ref Range   Glucose-Capillary 140 (H) 65 - 99 mg/dL  Glucose, capillary     Status: None   Collection Time: 11/03/15  9:16 PM  Result Value Ref Range   Glucose-Capillary 77 65 -  99 mg/dL   Comment 1 Notify RN   CBC     Status: Abnormal   Collection Time: 11/04/15  7:05 AM  Result Value Ref Range   WBC 2.5 (L) 4.0 - 10.5 K/uL   RBC 2.53 (L) 4.22 - 5.81 MIL/uL   Hemoglobin 8.7 (L) 13.0 - 17.0 g/dL   HCT 25.9 (L) 39.0 - 52.0 %   MCV 102.4 (H) 78.0 - 100.0 fL   MCH 34.4 (H) 26.0 - 34.0 pg   MCHC 33.6 30.0 - 36.0 g/dL   RDW 13.9 11.5 - 15.5 %   Platelets 60 (L) 150 - 400 K/uL    Comment: CONSISTENT WITH PREVIOUS RESULT  Comprehensive metabolic panel      Status: Abnormal   Collection Time: 11/04/15  7:05 AM  Result Value Ref Range   Sodium 142 135 - 145 mmol/L   Potassium 4.6 3.5 - 5.1 mmol/L   Chloride 102 101 - 111 mmol/L   CO2 25 22 - 32 mmol/L   Glucose, Bld 86 65 - 99 mg/dL   BUN 48 (H) 6 - 20 mg/dL   Creatinine, Ser 6.11 (H) 0.61 - 1.24 mg/dL   Calcium 9.3 8.9 - 10.3 mg/dL   Total Protein 5.3 (L) 6.5 - 8.1 g/dL   Albumin 2.9 (L) 3.5 - 5.0 g/dL   AST 38 15 - 41 U/L   ALT 31 17 - 63 U/L   Alkaline Phosphatase 102 38 - 126 U/L   Total Bilirubin 1.2 0.3 - 1.2 mg/dL   GFR calc non Af Amer 9 (L) >60 mL/min   GFR calc Af Amer 10 (L) >60 mL/min    Comment: (NOTE) The eGFR has been calculated using the CKD EPI equation. This calculation has not been validated in all clinical situations. eGFR's persistently <60 mL/min signify possible Chronic Kidney Disease.    Anion gap 15 5 - 15  Phosphorus     Status: Abnormal   Collection Time: 11/04/15  7:05 AM  Result Value Ref Range   Phosphorus 5.0 (H) 2.5 - 4.6 mg/dL  I-STAT 4, (NA,K, GLUC, HGB,HCT)     Status: Abnormal   Collection Time: 11/04/15 11:53 AM  Result Value Ref Range   Sodium 140 135 - 145 mmol/L   Potassium 3.1 (L) 3.5 - 5.1 mmol/L   Glucose, Bld 72 65 - 99 mg/dL   HCT 24.0 (L) 39.0 - 52.0 %   Hemoglobin 8.2 (L) 13.0 - 17.0 g/dL  Glucose, capillary     Status: None   Collection Time: 11/04/15  1:43 PM  Result Value Ref Range   Glucose-Capillary 67 65 - 99 mg/dL  Glucose, capillary     Status: None   Collection Time: 11/04/15  2:27 PM  Result Value Ref Range   Glucose-Capillary 78 65 - 99 mg/dL    No results found.  ROS: At the time of this consult note patient is feeling relatively well- he is no longer complaining of abdominal pain or feelings of fevers, chills. He denies shortness of breath and chest pain Blood pressure 137/69, pulse (!) 58, temperature 98 F (36.7 C), temperature source Oral, resp. rate 18, height 6' 4"  (1.93 m), weight 91.6 kg (201 lb 15.1  oz), SpO2 100 %. General appearance: alert and no distress Eyes: Patient is blind Neck: no adenopathy, no carotid bruit, no JVD, supple, symmetrical, trachea midline and thyroid not enlarged, symmetric, no tenderness/mass/nodules Resp: clear to auscultation bilaterally Cardio: regular rate and rhythm, S1, S2 normal,  no murmur, click, rub or gallop GI: soft, non-tender; bowel sounds normal; no masses,  no organomegaly Extremities: extremities normal, atraumatic, no cyanosis or edema Skin: Skin color, texture, turgor normal. No rashes or lesions AV fistula is patent and shows no signs of infection  Assessment/Plan: 65 year old white male with multiple medical issues. Fairly recent nephrectomy complicated by infection/drainage catheter removed 6-8 weeks ago. He also has ESRD. He presents with fevers, chills and Escherichia coli bacteremia 1 E. Coli Bacteremia- appears to have improved with the Zosyn therapy. The source as of yet is still an unknown. Colonoscopy did not reveal anything concerning. A urologic cause is still in the running. This is per primary team. I think repeat imaging is being considered. He no longer has abdominal pain 2 ESRD: Normally  Monday Wednesday Friday- via AV fistula. He has been dialyzing on schedule here- next due Friday  3 Hypertension: Actually now is hypotensive on midodrine- it is to be continued 4. Anemia of ESRD: Hemoglobin was 10 as an outpatient. Here today 8.7.  Will give ESA 5. Metabolic Bone Disease: We'll continue calcitriol with dialysis. Also continuing on Renvela, Sensipar and Rena-Vite as in OP setting    [place] A 11/04/2015, 3:06 PM

## 2015-11-04 NOTE — Interval H&P Note (Signed)
History and Physical Interval Note:  11/04/2015 12:08 PM  [name]  has presented today for surgery, with the diagnosis of bleed  The various methods of treatment have been discussed with the patient and family. After consideration of risks, benefits and other options for treatment, the patient has consented to  Procedure(s): COLONOSCOPY (N/A) as a surgical intervention .  The patient's history has been reviewed, patient examined, no change in status, stable for surgery.  I have reviewed the patient's chart and labs.  Questions were answered to the patient's satisfaction.     [name]

## 2015-11-04 NOTE — H&P (View-Only) (Signed)
Progress Note   Subjective  Patient was scheduled for colonoscopy this morning however endorsed passing brown stool and was not cleared from his bowel prep. We discussed this and plan for colonoscopy tomorrow.    Objective   Vital signs in last 24 hours: Temp:  [97.9 F (36.6 C)-99.8 F (37.7 C)] 97.9 F (36.6 C) (08/01 0952) Pulse Rate:  [55-68] 57 (08/01 0952) Resp:  [12-22] 12 (08/01 0952) BP: (71-147)/(34-79) 147/79 (08/01 0952) SpO2:  [92 %-100 %] 98 % (08/01 0952) Weight:  [201 lb 11.5 oz (91.5 kg)-206 lb 12.7 oz (93.8 kg)] 205 lb 0.4 oz (93 kg) (07/31 2017) Last BM Date: 11/02/15 General:    white male in NAD Heart:  Regular rate and rhythm Lungs: Respirations even and unlabored, lungs CTA bilaterally Abdomen:  Soft, protuberant,  nontender and nondistended.  Extremities:  Without edema. Neurologic:  Alert and oriented,  grossly normal neurologically. Psych:  Cooperative. Normal mood and affect.  Intake/Output from previous day: 07/31 0701 - 08/01 0700 In: 120 [P.O.:120] Out: 2300  Intake/Output this shift: No intake/output data recorded.  Lab Results:  Recent Labs  11/02/15 0436  WBC 5.0  HGB 8.2*  HCT 25.4*  PLT 59*   BMET  Recent Labs  11/01/15 1812 11/02/15 0436 11/03/15 0553  NA 144 145 140  K 3.6 3.7 4.1  CL 103 106 100*  CO2 27 28 30   GLUCOSE 131* 132* 82  BUN 86* 90* 39*  CREATININE 6.99* 7.68* 4.78*  CALCIUM 9.6 9.3 9.5   LFT  Recent Labs  11/03/15 0553  PROT 5.1*  ALBUMIN 2.8*  AST 39  ALT 27  ALKPHOS 92  BILITOT 1.1   PT/INR  Recent Labs  11/01/15 2200  LABPROT 14.5  INR 1.12    Studies/Results: Ct Abdomen Pelvis Wo Contrast  Result Date: 11/01/2015 CLINICAL DATA:  Fever and chills EXAM: CT ABDOMEN AND PELVIS WITHOUT CONTRAST TECHNIQUE: Multidetector CT imaging of the abdomen and pelvis was performed following the standard protocol without IV contrast. COMPARISON:  05/11/2015 FINDINGS: Lower chest:  Minimal  right basilar atelectasis is noted. Hepatobiliary: Gallbladder has been surgically removed. The liver is within normal limits Pancreas: No mass or inflammatory process identified on this un-enhanced exam. Spleen: Within normal limits in size. Adrenals/Urinary Tract: The adrenal glands are within normal limits. Changes of prior right nephrectomy are noted. Renal cystic changes are noted on the left stable from the prior study. Stomach/Bowel: Prominent fecal material is noted within the rectum which may represent a degree of impaction. No obstructive changes are seen. Inflammatory changes are again noted in the region of the ascending colon. Adjacent to the hepatic flexure and tip of the liver there is a 3.6 cm mildly enhancing soft tissue lesion which appears somewhat larger than that seen on the prior exam. This may represent sequelae from the prior infection The previously seen changes extending through the abdominal wall on the right have resolved. The appendix is within normal limits. Vascular/Lymphatic: No pathologically enlarged lymph nodes. No evidence of abdominal aortic aneurysm. Reproductive: Brachytherapy seeds are noted within the prostate. Other: None. Musculoskeletal: Postsurgical changes are noted in the proximal left femur. Degenerative changes of the lumbar spine are seen. IMPRESSION: Circumferential thickening in the ascending colon with some surrounding inflammatory changes. The wall thickening has increased in the interval from the prior exam. This raises suspicion for possible underlying neoplasm. Direct visualization is recommended. Soft tissue density adjacent to the hepatic flexure and tip  of the liver. Given the patient's clinical history the possibility of local recurrence of previously resected renal carcinoma. Resolution of previously seen inflammatory changes extending to the right abdominal wall. Changes of prior nephrectomy. No other acute abnormality is seen. Electronically Signed    By: Inez Catalina M.D.   On: 11/01/2015 19:39  Dg Chest Portable 1 View  Result Date: 11/01/2015 CLINICAL DATA:  Fever and chills EXAM: PORTABLE CHEST 1 VIEW COMPARISON:  05/11/2015 FINDINGS: Chronic elevation of the right diaphragm. Normal heart size and mediastinal contours status post CABG. There is no edema, consolidation, effusion, or pneumothorax. IMPRESSION: Negative for pneumonia. Electronically Signed   By: Monte Fantasia M.D.   On: 11/01/2015 19:10      Assessment / Plan:   65 y/o male with history of renal cell carcinoma and ESRD on dialysis who presented with acute abdominal pain and fever. CT imaging as above. I reviewed the patient's CT scans with radiology yesterday. We both feel the CT thickening of the colon may more likely represent underdistension or secondary inflammatory changes from the inflammation in the retroperitoneum, and less likely a primary colon cancer or colitis, although can't be certain without direct visualization. He has no diarrhea or blood per rectum. He had colonoscopy 18 months ago without any significant pathology in the ascending colon. While I think colon cancer is less likely, however, I offered the patient a colonoscopy to ensure normal given his ongoing / "progressive" CT findings. He was scheduled for today however unfortunately bowel prep was inadequate. He can be placed on clear liquid diet today through midnight and bowel prep again today / this evening, with plans for colonoscopy tomorrow. NPO after MN. Discussed with patient and wife who agreed. Will need to coordinate colonoscopy around dialysis  Of note, there is otherwise an enlarged soft tissue density in the retroperitoneum on CT based on review with radiology, and would consider a Urology consult to review this and see if further evaluation is warranted from their standpoint. Low platelets also noted on admission labs which is new, will repeat CBC today to see where this is trending and further  workup if persistent.  [place] Cellar, MD [facility]astro[facility] Pager [phone]

## 2015-11-04 NOTE — Anesthesia Postprocedure Evaluation (Signed)
Anesthesia Post Note  Patient: [name]  Procedure(s) Performed: Procedure(s) (LRB): COLONOSCOPY (N/A)  Patient location during evaluation: Endoscopy Anesthesia Type: MAC Level of consciousness: awake Pain management: pain level controlled Respiratory status: spontaneous breathing Cardiovascular status: stable Anesthetic complications: no    Last Vitals:  Vitals:   11/04/15 1310 11/04/15 1315  BP: (!) 111/59 123/68  Pulse: (!) 58 (!) 56  Resp: 16 15  Temp:      Last Pain:  Vitals:   11/04/15 1151  TempSrc: Oral  PainSc:                  EDWARDS,[name]

## 2015-11-04 NOTE — Transfer of Care (Signed)
Immediate Anesthesia Transfer of Care Note  Patient: [name]  Procedure(s) Performed: Procedure(s): COLONOSCOPY (N/A)  Patient Location: Endoscopy Unit  Anesthesia Type:MAC  Level of Consciousness: awake, alert  and oriented  Airway & Oxygen Therapy: Patient Spontanous Breathing and Patient connected to nasal cannula oxygen  Post-op Assessment: Report given to RN and Post -op Vital signs reviewed and stable  Post vital signs: Reviewed and stable  Last Vitals:  Vitals:   11/04/15 1117 11/04/15 1151  BP: 100/61 (!) 120/58  Pulse: (!) 55 (!) 56  Resp: 18 12  Temp: 36.8 C 36.9 C    Last Pain:  Vitals:   11/04/15 1151  TempSrc: Oral  PainSc:          Complications: No apparent anesthesia complications

## 2015-11-05 DIAGNOSIS — R7881 Bacteremia: Secondary | ICD-10-CM

## 2015-11-05 LAB — CBC
HCT: 25.8 % — ABNORMAL LOW (ref 39.0–52.0)
Hemoglobin: 8.7 g/dL — ABNORMAL LOW (ref 13.0–17.0)
MCH: 34.5 pg — AB (ref 26.0–34.0)
MCHC: 33.7 g/dL (ref 30.0–36.0)
MCV: 102.4 fL — AB (ref 78.0–100.0)
PLATELETS: 62 10*3/uL — AB (ref 150–400)
RBC: 2.52 MIL/uL — AB (ref 4.22–5.81)
RDW: 13.7 % (ref 11.5–15.5)
WBC: 2.1 10*3/uL — ABNORMAL LOW (ref 4.0–10.5)

## 2015-11-05 LAB — GLUCOSE, CAPILLARY
GLUCOSE-CAPILLARY: 135 mg/dL — AB (ref 65–99)
Glucose-Capillary: 151 mg/dL — ABNORMAL HIGH (ref 65–99)

## 2015-11-05 MED ORDER — MIDODRINE HCL 10 MG PO TABS: 10.0000 mg | ORAL_TABLET | Freq: Three times a day (TID) | ORAL | 0 refills | Status: DC

## 2015-11-05 MED ORDER — AMOXICILLIN 500 MG PO CAPS
500.0000 mg | ORAL_CAPSULE | ORAL | Status: DC
  Administered 2015-11-05: 500 mg via ORAL
  Filled 2015-11-05: qty 1

## 2015-11-05 MED ORDER — AMOXICILLIN 500 MG PO CAPS: 500.0000 mg | ORAL_CAPSULE | ORAL | 0 refills | Status: AC

## 2015-11-05 MED ORDER — CEFTAZIDIME 2 G IJ SOLR: 2.0000 g | INTRAMUSCULAR | Status: DC

## 2015-11-05 MED ORDER — POLYETHYLENE GLYCOL 3350 17 G PO PACK: 17.0000 g | PACK | Freq: Every day | ORAL | 0 refills | Status: DC

## 2015-11-05 NOTE — Progress Notes (Signed)
Subjective:  Tolerated HD yest/ no abd pain tolerated full brk. / noted Urolog. Seen  yest and Colonoscopy yest.   Objective Vital signs in last 24 hours: Vitals:   11/04/15 1346 11/04/15 1654 11/04/15 2012 11/05/15 0645  BP: 137/69 122/62 (!) 112/59 110/70  Pulse: (!) 58 (!) 58 (!) 59 (!) 51  Resp: 18 18 16 16   Temp: 98 F (36.7 C) 98.4 F (36.9 C) 98.5 F (36.9 C) 98.8 F (37.1 C)  TempSrc: Oral Oral Oral Oral  SpO2: 100% 98% 96% 99%  Weight:      Height:       Weight change: -2.748 kg (-6 lb 0.9 oz)  Physical Exam: General: alert/ NAD / Blind  WM  appropriate Heart: RRR  Lungs: CTA Abdomen:  BS pos,  Soft , NT, ND Extremities:no pedal edema  Dialysis Access: pos Bruit L FA AVF   Dialysis Orders: Center: [facility] MWF. EDW 91 kg (but leaving below 90.3, 90.0, 90.1 recently)HD Bath 2k, 2caTime 4hr 41min (Dr. Karleen Hampshire. Incr past week sec. decr urr With need new access Pt wants time ^ First)Heparin 8000. Access R FA AVFBFR 450 DFR 800 With 200 Dialyzer  PO Calcitriol 0.23mcg q /HD Mircera 50 mcg po q 2 weeks Last on 10/21/15 Other Op labs hgb 10.0 / 7/26 Cor ca 10 phos 6.3 pth 633    Problem/Plan: Assessment/Plan: 65 year old white male with multiple medical issues. Fairly recent nephrectomy complicated by infection/drainage catheter removed 6-8 weeks ago. He also has ESRD. He presents with fevers, chills and Escherichia coli bacteremia  1 E. Coli Bacteremia-  improved with the Zosyn therapy. The source as of yet is still an unknown. Colonoscopy did not reveal anything concerning.Urology saw recommends repeat CT in 3 months fu with [facility] team  As planned / per wife to see [facility]. Next Thursday.and  cause is still in the running. This is per primary team. Spoke with wife- she is frustrated at these recurrent episodes- working on getting them copies of scans to take to [facility]- we can treat with fortaz at dialysis another 8 treatments with  surveillance cultures  2 Abd. Pain resolved  GI eval =colonoscopy on 11/04/15 unsatisfactory due to poor prep,no pathology noted to correlate to CT findings, which GI suspects is coming from the retroperitoneum. 3 ESRD: Normally  Monday Wednesday Friday- via AV fistula. He has been dialyzing on schedule here- next due Friday  4 Hypertension: stable on midodrine- it is to be continued 5. Anemia of ESRD: Hemoglobin was 10 as an outpatient. Here today 8.7>8.2 on ESA  6. Metabolic Bone Disease:ON  calcitriol with dialysis. Also continuing on Renvela, Sensipar and Rena-Vite as in OP setting / Phos ok / sl ^ ca corrected use low ca bath   Ernest Haber, PA-C [place] [phone] 11/05/2015,8:13 AM  LOS: 2 days    Patient seen and examined, agree with above note with above modifications. Clinically he is better- wife frustrated with no answers- looks like will be discharged today- will do fortaz for 8 additional HD treatments- has follow up with [facility] next week- will get copies of scans to take with them to compare- this seems to be the most likely reason for continued issues in patient  Corliss Parish, MD 11/05/2015     Labs: Basic Metabolic Panel:  Recent Labs Lab 11/02/15 0436 11/03/15 0553 11/04/15 0705 11/04/15 1153  NA 145 140 142 140  K 3.7 4.1 4.6 3.1*  CL 106 100* 102  --  CO2 28 30 25   --   GLUCOSE 132* 82 86 72  BUN 90* 39* 48*  --   CREATININE 7.68* 4.78* 6.11*  --   CALCIUM 9.3 9.5 9.3  --   PHOS  --   --  5.0*  --    Liver Function Tests:  Recent Labs Lab 11/03/15 0553 11/04/15 0705  AST 39 38  ALT 27 31  ALKPHOS 92 102  BILITOT 1.1 1.2  PROT 5.1* 5.3*  ALBUMIN 2.8* 2.9*    Recent Labs Lab 11/01/15 1812  LIPASE 28   No results for input(s): AMMONIA in the last 168 hours. CBC:  Recent Labs Lab 11/02/15 0436 11/03/15 0553 11/04/15 0705 11/04/15 1153  WBC 5.0  --  2.5*  --   HGB 8.2*  --  8.7* 8.2*  HCT 25.4* 25.7* 25.9*  24.0*  MCV 106.3*  --  102.4*  --   PLT 59*  --  60*  --    Cardiac Enzymes: No results for input(s): CKTOTAL, CKMB, CKMBINDEX, TROPONINI in the last 168 hours. CBG:  Recent Labs Lab 11/04/15 1343 11/04/15 1427 11/04/15 1636 11/04/15 2017 11/05/15 0723  GLUCAP 67 78 82 113* 135*    Medications:   . acidophilus  1 capsule Oral Daily  . aspirin EC  81 mg Oral QHS  . atorvastatin  10 mg Oral QHS  . calcitRIOL  0.5 mcg Oral Q M,W,F-HD  . cinacalcet  30 mg Oral Q supper  . darbepoetin (ARANESP) injection - DIALYSIS  100 mcg Intravenous Q Wed-HD  . insulin aspart  0-5 Units Subcutaneous QHS  . insulin aspart  0-9 Units Subcutaneous TID WC  . midodrine  10 mg Oral 3 times per day  . multivitamin  1 tablet Oral QHS  . piperacillin-tazobactam (ZOSYN)  IV  2.25 g Intravenous Q8H  . polyethylene glycol  17 g Oral Daily  . sevelamer carbonate  4,000 mg Oral TID WC  . sodium chloride  250 mL Intravenous Once  . sodium chloride flush  3 mL Intravenous Q12H

## 2015-11-05 NOTE — Progress Notes (Signed)
Occupational Therapy Treatment Patient Details Name: [name] MRN: [id number] DOB: Nov 06, 1950 Today's Date: 11/05/2015    History of present illness  65 y.o. male ESRD MWF HD AT [facility]  ( bilateral blindness),admited to Observation  With fever  103 / abd pain / with ho  kidney cancer status post right renal resection     OT comments  Pt making progress with functional goals. Assisted to with dressing at EOB, sit - stand at [facility]. Pt scheudled to d/c home today with wife and will have [place]. Case mgr in to speak with pt and spouse after session  Follow Up Recommendations  Home health OT    Equipment Recommendations  Tub/shower bench    Recommendations for Other Services      Precautions / Restrictions Precautions Precautions: Fall;Other (comment) (blindness) Restrictions Weight Bearing Restrictions: No       Mobility Bed Mobility Overal bed mobility: Needs Assistance Bed Mobility: Supine to Sit;Rolling Rolling: Min assist   Supine to sit: Mod assist     General bed mobility comments: required cueing for hand placement on EOB rails with initial bed mobility modified indepdent but required mod assist to go from side lying to seated EOB,  Transfers Overall transfer level: Needs assistance Equipment used: Rolling walker (2 wheeled) Transfers: Sit to/from Stand Sit to Stand: Mod assist         General transfer comment: sit - stand. transfers have improved from last session    Balance   Sitting-balance support: No upper extremity supported;Feet supported Sitting balance-Leahy Scale: Fair     Standing balance support: Bilateral upper extremity supported;During functional activity;Single extremity supported Standing balance-Leahy Scale: Poor                     ADL       Grooming: Wash/dry hands;Wash/dry face;Set up;Sitting           Upper Body Dressing : Sitting;Set up;Supervision/safety   Lower Body Dressing: Moderate assistance;Sit to/from  stand;Maximal assistance Lower Body Dressing Details (indicate cue type and reason): pt able to stand at [facility] with OT to pull up underwear and pants from knee level Toilet Transfer: RW;Moderate assistance;BSC   Toileting- Clothing Manipulation and Hygiene: Moderate assistance       Functional mobility during ADLs: Moderate assistance;Rolling walker        Vision  blind, no change from baseline                              Cognition   Behavior During Therapy: WFL for tasks assessed/performed Overall Cognitive Status: Within Functional Limits for tasks assessed                       Extremity/Trunk Assessment   generalized weakness                        General Comments  pt very pleasant and cooperative    Pertinent Vitals/ Pain       Pain Assessment: No/denies pain  Home Living  home with wife                                        Prior Functioning/Environment   assist with ADLs, uses RW           Frequency  Min 2X/week     Progress Toward Goals  OT Goals(current goals can now be found in the care plan section)  Progress towards OT goals: Progressing toward goals     Plan Discharge plan remains appropriate                     End of Session Equipment Utilized During Treatment: Rolling walker;Gait belt   Activity Tolerance Patient tolerated treatment well   Patient Left in chair;with call bell/phone within reach;with family/visitor present             Time: 1110-1141 OT Time Calculation (min): 31 min  Charges: OT General Charges $OT Visit: 1 Procedure OT Treatments $Self Care/Home Management : 8-22 mins $Therapeutic Activity: 8-22 mins  Britt Bottom 11/05/2015, 1:07 PM

## 2015-11-05 NOTE — Care Management Note (Signed)
Case Management Note  Patient Details  Name: [name] MRN: [id number] Date of Birth: 07-21-1950  Subjective/Objective:        CM following for progression and d/c planning.             Action/Plan: 11/05/2015 Notified [facility] of plan for pt to d/c today. They have access to [facility] and are able to review orders and progress notes. No DME needs per pt wife.   Expected Discharge Date:    11/05/2015              Expected Discharge Plan:  [place]  In-House Referral:  NA  Discharge planning Services  CM Consult  Post Acute Care Choice:  Home Health Choice offered to:  Spouse  DME Arranged:   (Pt has DME and ramp per wife. ) DME Agency:  NA  HH Arranged:  RN, PT, OT HH Agency:  [facility]  Status of Service:  Completed, signed off  If discussed at [place] Meetings, dates discussed:    Additional Comments:  Adron Bene, RN 11/05/2015, 10:38 AM

## 2015-11-05 NOTE — Plan of Care (Signed)
Problem: Physical Regulation: Goal: Will remain free from infection Outcome: Adequate for Discharge Patient will be discharged on antibiotics.

## 2015-11-05 NOTE — Care Management Important Message (Signed)
Important Message  Patient Details  Name: [name] MRN: [id number] Date of Birth: January 08, 1951   Medicare Important Message Given:  Yes    Loann Quill 11/05/2015, 11:01 AM

## 2015-11-05 NOTE — Progress Notes (Signed)
Patient to be discharged to home with spouse. PIV removed per protocol. Telemetry box #17 removed and returned to nurse's station. Discharge instructions reviewed verbally with patient and patient's spouse, and visually with patient's spouse as the patient is blind. Patient and spouse aware of follow up appts. Prescriptions called into pharmacy by MD. Handout given on amoxicillin. All belongings returned to patient. Spouse to provide transportation home.  Joellen Jersey, RN.

## 2015-11-05 NOTE — Discharge Summary (Addendum)
Physician Discharge Summary  BOWEN KIA MRN: [id number] DOB/AGE: 66-Feb-1952 66 y.o.  PCP: [name], MD   Admit date: 11/01/2015 Discharge date: 11/05/2015  Discharge Diagnoses:    Principal Problem:   Sepsis [facility]) Active Problems:   CAD, s/p CABG 2013   ESRD (end stage renal disease) on dialysis ([place])   Renal carcinoma ([place])   DM type 2 (diabetes mellitus, type 2) ([place])   Bilateral blindness   End stage renal failure on dialysis ([place])   HLD (hyperlipidemia)   Abdominal pain   UTI (lower urinary tract infection)   Abnormal CT of the abdomen   Bacteremia    Follow-up recommendations Follow-up with PCP in 3-5 days , including all  additional recommended appointments as below Follow-up CBC, CMP in 3-5 days He should follow up with his transplant surgeon at [facility] who performed his nephrectomy for further evaluation/surveillance of this mass.  Repeat imaging in 3 months would be a reasonable approach.     Current Discharge Medication List    START taking these medications   Details  amoxicillin (AMOXIL) 500 MG capsule Take 1 capsule (500 mg total) by mouth daily. Qty: 21 capsule, Refills: 0    polyethylene glycol (MIRALAX / GLYCOLAX) packet Take 17 g by mouth daily. Qty: 14 each, Refills: 0      CONTINUE these medications which have CHANGED   Details  midodrine (PROAMATINE) 10 MG tablet Take 1 tablet (10 mg total) by mouth 3 (three) times daily with meals. Qty: 90 tablet, Refills: 0      CONTINUE these medications which have NOT CHANGED   Details  aspirin EC 81 MG tablet Take 81 mg by mouth at bedtime.    atorvastatin (LIPITOR) 10 MG tablet TAKE 1 TABLET BY MOUTH EVERY DAY Qty: 30 tablet, Refills: 6    cinacalcet (SENSIPAR) 30 MG tablet Take 30 mg by mouth daily with supper.    multivitamin (RENA-VIT) TABS tablet Take 1 tablet by mouth at bedtime.    Probiotic Product (ALIGN) 4 MG CAPS Take 1 capsule by mouth daily.    sevelamer carbonate (RENVELA) 800  MG tablet Take 2,400-4,000 mg by mouth See admin instructions. Take 5 tablets (4000 mg) by mouth 3 times daily with meals and 3 tablets (2400 mg) 2 times daily with snacks    acetaminophen (TYLENOL) 325 MG tablet Take 1-2 tablets (325-650 mg total) by mouth every 6 (six) hours as needed for moderate pain.    calcitRIOL (ROCALTROL) 0.25 MCG capsule Take 0.25 mcg by mouth 3 (three) times a week. Reported on 08/26/2015    heparin 5000 UNIT/ML injection Inject 8,000 Units into the skin 3 (three) times a week. Monday, Wednesday, and Friday.    Methoxy PEG-Epoetin Beta (MIRCERA) 50 MCG/0.3ML SOSY Inject 225 mcg as directed every 14 (fourteen) days.       STOP taking these medications     loperamide (IMODIUM A-D) 2 MG tablet            Discharge Condition: *Stable  Discharge Instructions Get Medicines reviewed and adjusted: Please take all your medications with you for your next visit with your Primary MD  Please request your Primary MD to go over all hospital tests and procedure/radiological results at the follow up, please ask your Primary MD to get all Hospital records sent to his/her office.  If you experience worsening of your admission symptoms, develop shortness of breath, life threatening emergency, suicidal or homicidal thoughts you must seek medical attention immediately by  calling 911 or calling your MD immediately if symptoms less severe.  You must read complete instructions/literature along with all the possible adverse reactions/side effects for all the Medicines you take and that have been prescribed to you. Take any new Medicines after you have completely understood and accpet all the possible adverse reactions/side effects.   Do not drive when taking Pain medications.   Do not take more than prescribed Pain, Sleep and Anxiety Medications  Special Instructions: If you have smoked or chewed Tobacco in the last 2 yrs please stop smoking, stop any regular Alcohol and or  any Recreational drug use.  Wear Seat belts while driving.  Please note  You were cared for by a hospitalist during your hospital stay. Once you are discharged, your primary care physician will handle any further medical issues. Please note that NO REFILLS for any discharge medications will be authorized once you are discharged, as it is imperative that you return to your primary care physician (or establish a relationship with a primary care physician if you do not have one) for your aftercare needs so that they can reassess your need for medications and monitor your lab values.     Allergies  Allergen Reactions  . Codeine Other (See Comments)    Makes patient incoherent. STATES MAKES HIM COMATOSE  . Tape Other (See Comments)    Plastic tape tears skin off, please use paper tape instead.      Disposition: 01-Home or Self Care   Consults:  Gastroenterology  urology   Significant Diagnostic Studies:  Ct Abdomen Pelvis Wo Contrast  Result Date: 11/01/2015 CLINICAL DATA:  Fever and chills EXAM: CT ABDOMEN AND PELVIS WITHOUT CONTRAST TECHNIQUE: Multidetector CT imaging of the abdomen and pelvis was performed following the standard protocol without IV contrast. COMPARISON:  05/11/2015 FINDINGS: Lower chest:  Minimal right basilar atelectasis is noted. Hepatobiliary: Gallbladder has been surgically removed. The liver is within normal limits Pancreas: No mass or inflammatory process identified on this un-enhanced exam. Spleen: Within normal limits in size. Adrenals/Urinary Tract: The adrenal glands are within normal limits. Changes of prior right nephrectomy are noted. Renal cystic changes are noted on the left stable from the prior study. Stomach/Bowel: Prominent fecal material is noted within the rectum which may represent a degree of impaction. No obstructive changes are seen. Inflammatory changes are again noted in the region of the ascending colon. Adjacent to the hepatic flexure and  tip of the liver there is a 3.6 cm mildly enhancing soft tissue lesion which appears somewhat larger than that seen on the prior exam. This may represent sequelae from the prior infection The previously seen changes extending through the abdominal wall on the right have resolved. The appendix is within normal limits. Vascular/Lymphatic: No pathologically enlarged lymph nodes. No evidence of abdominal aortic aneurysm. Reproductive: Brachytherapy seeds are noted within the prostate. Other: None. Musculoskeletal: Postsurgical changes are noted in the proximal left femur. Degenerative changes of the lumbar spine are seen. IMPRESSION: Circumferential thickening in the ascending colon with some surrounding inflammatory changes. The wall thickening has increased in the interval from the prior exam. This raises suspicion for possible underlying neoplasm. Direct visualization is recommended. Soft tissue density adjacent to the hepatic flexure and tip of the liver. Given the patient's clinical history the possibility of local recurrence of previously resected renal carcinoma. Resolution of previously seen inflammatory changes extending to the right abdominal wall. Changes of prior nephrectomy. No other acute abnormality is seen. Electronically Signed  By: Inez Catalina M.D.   On: 11/01/2015 19:39  Ct Abdomen Pelvis W Contrast  Result Date: 11/04/2015 CLINICAL DATA:  Bacteremia. Recent CT demonstrating ascending colonic inflammation, recent colonoscopy "did not visualize anything concerning, although bowel prep was incomplete" per technologist notes. Patient is post nephrectomy for renal cell carcinoma complicated by postoperative infection, drainage tube removed 6-8 weeks prior. EXAM: CT ABDOMEN AND PELVIS WITH CONTRAST TECHNIQUE: Multidetector CT imaging of the abdomen and pelvis was performed using the standard protocol following bolus administration of intravenous contrast. CONTRAST:  15m ISOVUE-300 IOPAMIDOL  (ISOVUE-300) INJECTION 61% COMPARISON:  Noncontrast CT 11/01/2015, and 05/11/2015 FINDINGS: Lower chest: Small bilateral pleural effusions are new. Adjacent atelectasis in the right lower lobe. Chronic calcifications at the lung bases. Liver: No focal hepatic lesion. Hepatobiliary: Clips in gallbladder fossa postcholecystectomy. Intra and extrahepatic biliary prominence, common bile duct measures 9 mm, likely sequela of cholecystectomy. Pancreas: Atrophic parenchyma. No ductal dilatation or inflammation. Spleen: Enlarged measuring 14.0 x 7.2 x 18.5 cm. Adrenal glands: No discrete adrenal nodule. Minimal thickening of the left adrenal gland. Kidneys: Post right nephrectomy. Adjacent to the nephrectomy clips is ill-defined soft tissue density and likely scarring extending in the extraperitoneal space. This is immediately adjacent to a more solid nodule measuring 3.7 cm, contiguous with an adjacent more heterogeneous density. This is at site of previous inflammatory phlegmonous change, and may be sequela of prior infection. No discrete abscess is seen. Left kidney is small in size with thinning of the renal parenchyma. Cyst in the interpolar kidney is again seen. There is calcification scarring in the medial mid left kidney. No hydronephrosis or perinephric edema. Stomach/Bowel: Stomach is decompressed. Fluid within the duodenum that abuts the site of previous inflammation, however no duodenum wall thickening is seen. No extraluminal enteric contrast. There are no dilated or thickened small bowel loops. No bowel obstruction with enteric contrast seen throughout the colon. The ascending colon adjacent to the region are from previous inflammatory change demonstrates persistent wall thickening, this may be reactive. Minimal sigmoid and rectosigmoid wall thickening also seen. The appendix is normal. Vascular/Lymphatic: No retroperitoneal adenopathy. Abdominal aorta is normal in caliber. Advanced atherosclerosis of the  abdominal aorta and its branches. No aneurysm. Reproductive: Brachytherapy seeds in the prostate bed. Bladder: Physiologically distended with unchanged wall thickening. Other: No intra-abdominal abscess, free air or ascites. Scarring in the right abdomen at site of previous inflammation. There is whole body wall edema. No subcutaneous fluid collection. Musculoskeletal: Sclerotic density in the sacrum is unchanged. Bones are under mineralized. Degenerative change in the spine. Postsurgical change in the left hip. IMPRESSION: 1. Persistent wall thickening involving the ascending colon. This is adjacent to site of previous inflammation and may be reactive given no localizing findings on colonoscopy. 2. Presumed scarring at site of previous inflammation in the right lateral abdomen extending to the skin surface which has greatly improved from February 2017. 3. Soft tissue nodule measuring 3.7 cm adjacent to the right nephrectomy bed and region of prior inflammation. This is suspicious for local recurrence. 4. No new findings to explain bacteremia. Electronically Signed   By: MJeb LeveringM.D.   On: 11/04/2015 18:27   Dg Chest Portable 1 View  Result Date: 11/01/2015 CLINICAL DATA:  Fever and chills EXAM: PORTABLE CHEST 1 VIEW COMPARISON:  05/11/2015 FINDINGS: Chronic elevation of the right diaphragm. Normal heart size and mediastinal contours status post CABG. There is no edema, consolidation, effusion, or pneumothorax. IMPRESSION: Negative for pneumonia. Electronically Signed  By: Monte Fantasia M.D.   On: 11/01/2015 19:10      Filed Weights   11/04/15 0453 11/04/15 0645 11/04/15 1117  Weight: 94.3 kg (208 lb) 93.8 kg (206 lb 12.7 oz) 91.6 kg (201 lb 15.1 oz)     Microbiology: Recent Results (from the past 240 hour(s))  Culture, blood (Routine x 2)     Status: Abnormal   Collection Time: 11/01/15  6:12 PM  Result Value Ref Range Status   Specimen Description BLOOD RIGHT FOREARM  Final    Special Requests BOTTLES DRAWN AEROBIC AND ANAEROBIC 5CC  Final   Culture  Setup Time   Final    GRAM NEGATIVE RODS AEROBIC BOTTLE ONLY CRITICAL RESULT CALLED TO, READ BACK BY AND VERIFIED WITH: M TURNER 11/02/15 @ 1143 M VESTAL    Culture ESCHERICHIA COLI (A)  Final   Report Status 11/04/2015 FINAL  Final   Organism ID, Bacteria ESCHERICHIA COLI  Final      Susceptibility   Escherichia coli - MIC*    AMPICILLIN <=2 SENSITIVE Sensitive     CEFAZOLIN <=4 SENSITIVE Sensitive     CEFEPIME <=1 SENSITIVE Sensitive     CEFTAZIDIME <=1 SENSITIVE Sensitive     CEFTRIAXONE <=1 SENSITIVE Sensitive     CIPROFLOXACIN >=4 RESISTANT Resistant     GENTAMICIN <=1 SENSITIVE Sensitive     IMIPENEM <=0.25 SENSITIVE Sensitive     TRIMETH/SULFA <=20 SENSITIVE Sensitive     AMPICILLIN/SULBACTAM <=2 SENSITIVE Sensitive     PIP/TAZO <=4 SENSITIVE Sensitive     Extended ESBL NEGATIVE Sensitive     * ESCHERICHIA COLI  Blood Culture ID Panel (Reflexed)     Status: Abnormal   Collection Time: 11/01/15  6:12 PM  Result Value Ref Range Status   Enterococcus species NOT DETECTED NOT DETECTED Final   Vancomycin resistance NOT DETECTED NOT DETECTED Final   Listeria monocytogenes NOT DETECTED NOT DETECTED Final   Staphylococcus species NOT DETECTED NOT DETECTED Final   Staphylococcus aureus NOT DETECTED NOT DETECTED Final   Methicillin resistance NOT DETECTED NOT DETECTED Final   Streptococcus species NOT DETECTED NOT DETECTED Final   Streptococcus agalactiae NOT DETECTED NOT DETECTED Final   Streptococcus pneumoniae NOT DETECTED NOT DETECTED Final   Streptococcus pyogenes NOT DETECTED NOT DETECTED Final   Acinetobacter baumannii NOT DETECTED NOT DETECTED Final   Enterobacteriaceae species DETECTED (A) NOT DETECTED Final    Comment: CRITICAL RESULT CALLED TO, READ BACK BY AND VERIFIED WITH: M TURNER 11/02/15 @ 1143 M VESTAL    Enterobacter cloacae complex NOT DETECTED NOT DETECTED Final   Escherichia coli  DETECTED (A) NOT DETECTED Final    Comment: CRITICAL RESULT CALLED TO, READ BACK BY AND VERIFIED WITH: M TURNER 11/02/15 @ 1143 M VESTAL    Klebsiella oxytoca NOT DETECTED NOT DETECTED Final   Klebsiella pneumoniae NOT DETECTED NOT DETECTED Final   Proteus species NOT DETECTED NOT DETECTED Final   Serratia marcescens NOT DETECTED NOT DETECTED Final   Carbapenem resistance NOT DETECTED NOT DETECTED Final   Haemophilus influenzae NOT DETECTED NOT DETECTED Final   Neisseria meningitidis NOT DETECTED NOT DETECTED Final   Pseudomonas aeruginosa NOT DETECTED NOT DETECTED Final   Candida albicans NOT DETECTED NOT DETECTED Final   Candida glabrata NOT DETECTED NOT DETECTED Final   Candida krusei NOT DETECTED NOT DETECTED Final   Candida parapsilosis NOT DETECTED NOT DETECTED Final   Candida tropicalis NOT DETECTED NOT DETECTED Final  Culture, blood (Routine x 2)  Status: None (Preliminary result)   Collection Time: 11/01/15  6:30 PM  Result Value Ref Range Status   Specimen Description BLOOD RIGHT HAND  Final   Special Requests BOTTLES DRAWN AEROBIC AND ANAEROBIC 5CC  Final   Culture NO GROWTH 3 DAYS  Final   Report Status PENDING  Incomplete  Urine culture     Status: None   Collection Time: 11/01/15  7:17 PM  Result Value Ref Range Status   Specimen Description URINE, RANDOM  Final   Special Requests NONE  Final   Culture NO GROWTH  Final   Report Status 11/02/2015 FINAL  Final  MRSA PCR Screening     Status: None   Collection Time: 11/02/15 12:09 AM  Result Value Ref Range Status   MRSA by PCR NEGATIVE NEGATIVE Final    Comment:        The GeneXpert MRSA Assay (FDA approved for NASAL specimens only), is one component of a comprehensive MRSA colonization surveillance program. It is not intended to diagnose MRSA infection nor to guide or monitor treatment for MRSA infections.        Blood Culture    Component Value Date/Time   SDES URINE, RANDOM 11/01/2015 1917    SPECREQUEST NONE 11/01/2015 1917   CULT NO GROWTH 11/01/2015 1917   REPTSTATUS 11/02/2015 FINAL 11/01/2015 1917      Labs: Results for orders placed or performed during the hospital encounter of 11/01/15 (from the past 48 hour(s))  Glucose, capillary     Status: Abnormal   Collection Time: 11/03/15 12:12 PM  Result Value Ref Range   Glucose-Capillary 64 (L) 65 - 99 mg/dL   Comment 1 Notify RN   Glucose, capillary     Status: Abnormal   Collection Time: 11/03/15 12:51 PM  Result Value Ref Range   Glucose-Capillary 102 (H) 65 - 99 mg/dL  Glucose, capillary     Status: Abnormal   Collection Time: 11/03/15  4:58 PM  Result Value Ref Range   Glucose-Capillary 140 (H) 65 - 99 mg/dL  Glucose, capillary     Status: None   Collection Time: 11/03/15  9:16 PM  Result Value Ref Range   Glucose-Capillary 77 65 - 99 mg/dL   Comment 1 Notify RN   CBC     Status: Abnormal   Collection Time: 11/04/15  7:05 AM  Result Value Ref Range   WBC 2.5 (L) 4.0 - 10.5 K/uL   RBC 2.53 (L) 4.22 - 5.81 MIL/uL   Hemoglobin 8.7 (L) 13.0 - 17.0 g/dL   HCT 25.9 (L) 39.0 - 52.0 %   MCV 102.4 (H) 78.0 - 100.0 fL   MCH 34.4 (H) 26.0 - 34.0 pg   MCHC 33.6 30.0 - 36.0 g/dL   RDW 13.9 11.5 - 15.5 %   Platelets 60 (L) 150 - 400 K/uL    Comment: CONSISTENT WITH PREVIOUS RESULT  Comprehensive metabolic panel     Status: Abnormal   Collection Time: 11/04/15  7:05 AM  Result Value Ref Range   Sodium 142 135 - 145 mmol/L   Potassium 4.6 3.5 - 5.1 mmol/L   Chloride 102 101 - 111 mmol/L   CO2 25 22 - 32 mmol/L   Glucose, Bld 86 65 - 99 mg/dL   BUN 48 (H) 6 - 20 mg/dL   Creatinine, Ser 6.11 (H) 0.61 - 1.24 mg/dL   Calcium 9.3 8.9 - 10.3 mg/dL   Total Protein 5.3 (L) 6.5 - 8.1 g/dL   Albumin  2.9 (L) 3.5 - 5.0 g/dL   AST 38 15 - 41 U/L   ALT 31 17 - 63 U/L   Alkaline Phosphatase 102 38 - 126 U/L   Total Bilirubin 1.2 0.3 - 1.2 mg/dL   GFR calc non Af Amer 9 (L) >60 mL/min   GFR calc Af Amer 10 (L) >60 mL/min     Comment: (NOTE) The eGFR has been calculated using the CKD EPI equation. This calculation has not been validated in all clinical situations. eGFR's persistently <60 mL/min signify possible Chronic Kidney Disease.    Anion gap 15 5 - 15  Phosphorus     Status: Abnormal   Collection Time: 11/04/15  7:05 AM  Result Value Ref Range   Phosphorus 5.0 (H) 2.5 - 4.6 mg/dL  I-STAT 4, (NA,K, GLUC, HGB,HCT)     Status: Abnormal   Collection Time: 11/04/15 11:53 AM  Result Value Ref Range   Sodium 140 135 - 145 mmol/L   Potassium 3.1 (L) 3.5 - 5.1 mmol/L   Glucose, Bld 72 65 - 99 mg/dL   HCT 24.0 (L) 39.0 - 52.0 %   Hemoglobin 8.2 (L) 13.0 - 17.0 g/dL  Glucose, capillary     Status: None   Collection Time: 11/04/15  1:43 PM  Result Value Ref Range   Glucose-Capillary 67 65 - 99 mg/dL  Glucose, capillary     Status: None   Collection Time: 11/04/15  2:27 PM  Result Value Ref Range   Glucose-Capillary 78 65 - 99 mg/dL  Glucose, capillary     Status: None   Collection Time: 11/04/15  4:36 PM  Result Value Ref Range   Glucose-Capillary 82 65 - 99 mg/dL  Glucose, capillary     Status: Abnormal   Collection Time: 11/04/15  8:17 PM  Result Value Ref Range   Glucose-Capillary 113 (H) 65 - 99 mg/dL  CBC     Status: Abnormal   Collection Time: 11/05/15  6:38 AM  Result Value Ref Range   WBC 2.1 (L) 4.0 - 10.5 K/uL   RBC 2.52 (L) 4.22 - 5.81 MIL/uL   Hemoglobin 8.7 (L) 13.0 - 17.0 g/dL   HCT 25.8 (L) 39.0 - 52.0 %   MCV 102.4 (H) 78.0 - 100.0 fL   MCH 34.5 (H) 26.0 - 34.0 pg   MCHC 33.7 30.0 - 36.0 g/dL   RDW 13.7 11.5 - 15.5 %   Platelets 62 (L) 150 - 400 K/uL    Comment: CONSISTENT WITH PREVIOUS RESULT  Glucose, capillary     Status: Abnormal   Collection Time: 11/05/15  7:23 AM  Result Value Ref Range   Glucose-Capillary 135 (H) 65 - 99 mg/dL     Lipid Panel     Component Value Date/Time   CHOL 120 09/28/2011 0515   TRIG 75 09/28/2011 0515   HDL 73 09/28/2011 0515    CHOLHDL 1.6 09/28/2011 0515   VLDL 15 09/28/2011 0515   LDLCALC 32 09/28/2011 0515     Lab Results  Component Value Date   HGBA1C 5.2 02/13/2015   HGBA1C 5.6 07/28/2014   HGBA1C 6.0 (H) 09/28/2011        HPI :  65 y.o. male with a history of renal cell carcinoma. Previous RFA ablation on right, cryoablation on left. S/p right nephrectomy for suspected recurrence in 01/2015, pathology negative for cancer. Post-operative course c/b recurrent abscesses between the nephrectomy and abdominal wall. S/p RLQ drain 07/2015 and treated with Vanc/ Cefepime/ Flagyl.  Drain removed in 09/03/15. ESRD on dialysis, Prostate cancer s/p brachytherapy, bilateral blindness, CAD, HTN, chronic anemia,DM, and thrombocytopenia.History of  2 weeks of foul smelling diarrhea, not described as bloody or watery resolving 1 week ago. Presented to the hospital yesterday with fever, chills, and abdominal pain. The pain was mild and on the right side of the abdomen. Associated 3 episodes of vomiting prior to admission and denies accompanied nausea. Complained of fever and chills with a Tmax of 103.0. Also denies changes in bowel movements, weight loss, hematochezia, weakness, or fatigue. His abdominal pain is better today after being treated with Vacomycin and Zosyn since admission.   HOSPITAL COURSE:    Sepsis ([place]) with gram-negative rods: pt is septic with elevated lactate and fever.   . The source of infection is likely intra-abdominal, colonic versus renal abscess / UTI. Given his recent postop infection with nephrostomy tube that was pulled 6 weeks ago,  CT findings as below Patient was treated with Zosyn from 7/32 8/3, subsequently switched to  amoxicillin for 2 weeks based on sensitivity   Procalcitonin 45.38 Blood culture positive for gram-negative rods-Escherichia coli  GI and urology consulted to further evaluate the source of this infection  Mild UTI-continue antibiotics as above  Hx of  Hypotension: -continue Midodrine, dose increased  CAD: s/p CABG. No chest pain. -continue ASA and Lipitor  Renal carcinoma [facility]): s/p of R nephrectomy, complicated by postop infection with nephrostomy tube that was pulled 6 weeks ago. CT-abdomen/pelvis has no new abscess formation.    ESRD (end stage renal disease) on dialysis (MWF): potassium 0.6, bicarbonate 27, creatinine 6.99, BUN 86. -left message to renal box for HD -continue Sensipar, Renvela and Calcitriol  Diet controlled DM-II:Last A1c 5.2, controled. Patient is not taking meds at home -SSI  XHB:ZJIR LDL was 32 on 09/28/11 -Continue home medications: Lipitor  Abdominal pain:   He has had positive blood cultures with E coli without a clear source found.  Urine culture from 7/28 was negative.  He has been receiving hemodialysis and also underwent colonoscopy today.  A CT without contrast was performed on 7/30 and there was a 3.7 cm hyperdense mass in the renal fossa of unclear significance.  A subsequent CT with contrast was performed on 8/2 and no abscess was noted with a stable 3.7 cm mass in the renal fossa. Gastroenterology was consulted, colonoscopy on 11/04/15 unsatisfactory due to poor prep,no pathology noted to correlate to CT findings, which GI suspects is coming from the retroperitoneum. Right renal fossa mass does not appear to be an abscess. Patient will continue with [facility]ress[facility] Ellis with hemodialysis,He should follow up with his transplant surgeon at [facility] who performed his nephrectomy for further evaluation/surveillance of this mass.  Repeat imaging in 3 months would be a reasonable approach    Anemia of chronic disease/thrombocytopenia No evidence of GI bleeding, platelets down likely secondary to sepsis Discontinued heparin , repeat CBC shows platelets of 62 at the time of discharge  Discharge Exam:   Blood pressure 110/70, pulse (!) 51, temperature 98.8 F (37.1 C), temperature source Oral, resp. rate 16, height _0   (1.93 m), weight 91.6 kg (201 lb 15.1 oz), SpO2 99 %.  General exam: Appears calm and comfortable  Respiratory system: Clear to auscultation. Respiratory effort normal. Cardiovascular system: S1 & S2 heard, RRR. No JVD, murmurs, rubs, gallops or clicks. No pedal edema. Gastrointestinal system: Abdomen is nondistended, soft and nontender. No organomegaly or masses felt. Normal bowel sounds heard. Central nervous system:  Alert and oriented. No focal neurological deficits. Extremities: Symmetric 5 x 5 power. Skin: No rashes, lesions or ulcers Psychiatry: Judgement and insight appear normal. Mood & affect appropriate.    Follow-up Information    [name], MD. Schedule an appointment as soon as possible for a visit in 2 day(s).   Specialty:  Internal Medicine Why:  Hospital follow-up Contact information: [address] [place] [address] [phone]        Urology at [facility]. Schedule an appointment as soon as possible for a visit in 2 day(s).   Why:  Hospital follow-up, please call to make this appointment as soon as possible          Signed: Jennessy Sandridge 11/05/2015, 8:54 AM        Time spent >45 mins \

## 2015-11-06 LAB — CULTURE, BLOOD (ROUTINE X 2): Culture: NO GROWTH

## 2015-12-14 ENCOUNTER — Ambulatory Visit: Payer: Medicare Other | Admitting: Sports Medicine

## 2015-12-15 ENCOUNTER — Ambulatory Visit (INDEPENDENT_AMBULATORY_CARE_PROVIDER_SITE_OTHER): Payer: Medicare Other | Admitting: Sports Medicine

## 2015-12-15 ENCOUNTER — Encounter: Payer: Self-pay | Admitting: Sports Medicine

## 2015-12-15 ENCOUNTER — Other Ambulatory Visit: Payer: Self-pay | Admitting: Interventional Radiology

## 2015-12-15 ENCOUNTER — Encounter: Payer: Self-pay | Admitting: Podiatry

## 2015-12-15 VITALS — Ht 76.0 in | Wt 198.0 lb

## 2015-12-15 DIAGNOSIS — IMO0002 Reserved for concepts with insufficient information to code with codable children: Secondary | ICD-10-CM

## 2015-12-15 DIAGNOSIS — T07XXXA Unspecified multiple injuries, initial encounter: Secondary | ICD-10-CM

## 2015-12-15 DIAGNOSIS — E1142 Type 2 diabetes mellitus with diabetic polyneuropathy: Secondary | ICD-10-CM

## 2015-12-15 DIAGNOSIS — T148 Other injury of unspecified body region: Secondary | ICD-10-CM

## 2015-12-15 DIAGNOSIS — I739 Peripheral vascular disease, unspecified: Secondary | ICD-10-CM

## 2015-12-15 DIAGNOSIS — M79673 Pain in unspecified foot: Secondary | ICD-10-CM | POA: Diagnosis not present

## 2015-12-15 DIAGNOSIS — B359 Dermatophytosis, unspecified: Secondary | ICD-10-CM

## 2015-12-15 DIAGNOSIS — B351 Tinea unguium: Secondary | ICD-10-CM

## 2015-12-15 DIAGNOSIS — C641 Malignant neoplasm of right kidney, except renal pelvis: Secondary | ICD-10-CM

## 2015-12-15 DIAGNOSIS — Z89431 Acquired absence of right foot: Secondary | ICD-10-CM

## 2015-12-15 DIAGNOSIS — C642 Malignant neoplasm of left kidney, except renal pelvis: Secondary | ICD-10-CM

## 2015-12-15 MED ORDER — CLOTRIMAZOLE 1 % EX SOLN: 1.0000 "application " | Freq: Two times a day (BID) | CUTANEOUS | 5 refills | Status: DC

## 2015-12-15 NOTE — Progress Notes (Signed)
Patient ID: [name], male   DOB: 1950-09-13, 65 y.o.   MRN: [id number] Subjective: [name] is a 65 y.o. male patient with history of type 2 diabetes who presents to office today complaining of long, painful nails on left while ambulating in shoes; unable to trim. Patient denies any new changes in medication or new problems. Patient denies any new cramping, numbness, burning or tingling in the legs.  FBS "good"  Patient Active Problem List   Diagnosis Date Noted  . Bacteremia   . Abnormal CT of the abdomen   . Abdominal pain 11/01/2015  . UTI (lower urinary tract infection) 11/01/2015  . Sepsis ([place]) 11/01/2015  . Bilateral blindness 06/16/2015  . Carcinoma of kidney ([place]) 06/16/2015  . Type 2 diabetes mellitus ([place]) 06/16/2015  . Intra-abdominal infection 05/11/2015  . Chronic anemia 05/11/2015  . Infection in abdomen ([place]) 05/11/2015  . Second degree AV block, Mobitz type I 02/15/2015  . Atrioventricular block, Mobitz type 1, Wenckebach 02/15/2015  . Hypotension 02/14/2015  . Hypoglycemia 02/13/2015  . DM type 2, goal A1c below 7 02/13/2015  . Blind   . DM type 2 (diabetes mellitus, type 2) ([place])   . Clear cell renal cell carcinoma (HCC)   . CAD in native artery 09/10/2014  . Renal carcinoma ([place])   . Non-intractable cyclical vomiting with nausea   . Hydronephrosis 07/28/2014  . Coccyx pain 01/03/2014  . Thrombocytopenia ([place]) 01/03/2014  . Intertrochanteric fracture of left hip ([place]) 12/30/2013  . Dialysis patient ([place]) 12/30/2013  . Hip fracture ([place]) 12/30/2013  . Dependence on renal dialysis ([place]) 12/30/2013  . Hypotension of hemodialysis 06/19/2013  . Malignant neoplasm of prostate ([place]) 01/30/2012  . Diabetes mellitus ([place]) 08/30/2011  . Peripheral vascular disease ([place]) 05/27/2009  . COLONIC POLYPS 02/12/2009  . ANEMIA 01/27/2009  . GLAUCOMA 01/27/2009  . Essential hypertension 01/27/2009  . ESRD (end stage renal disease) on dialysis ([place]) 01/27/2009   . End stage renal failure on dialysis ([place]) 01/27/2009  . DIAB W/UNS COMP TYPE II/UNS NOT STATED UNCNTRL 01/08/2008  . HYPERLIPIDEMIA-MIXED 01/08/2008  . OVERWEIGHT/OBESITY 01/08/2008  . DEPRESSIVE DISORDER NOT ELSEWHERE CLASSIFIED 01/08/2008  . CAD, s/p CABG 2013 01/08/2008  . EDEMA 01/08/2008  . Clinical depression 01/08/2008  . HLD (hyperlipidemia) 01/08/2008   Current Outpatient Prescriptions on File Prior to Visit  Medication Sig Dispense Refill  . acetaminophen (TYLENOL) 325 MG tablet Take 1-2 tablets (325-650 mg total) by mouth every 6 (six) hours as needed for moderate pain. (Patient not taking: Reported on 11/01/2015)    . aspirin EC 81 MG tablet Take 81 mg by mouth at bedtime.    [name] atorvastatin (LIPITOR) 10 MG tablet TAKE 1 TABLET BY MOUTH EVERY DAY (Patient taking differently: TAKE 1 TABLET BY MOUTH DAILY AT BEDTIME) 30 tablet 6  . calcitRIOL (ROCALTROL) 0.25 MCG capsule Take 0.25 mcg by mouth 3 (three) times a week. Reported on 08/26/2015    . cinacalcet (SENSIPAR) 30 MG tablet Take 30 mg by mouth daily with supper.    . heparin 5000 UNIT/ML injection Inject 8,000 Units into the skin 3 (three) times a week. Monday, Wednesday, and Friday.    . Methoxy PEG-Epoetin Beta (MIRCERA) 50 MCG/0.3ML SOSY Inject 225 mcg as directed every 14 (fourteen) days.     . midodrine (PROAMATINE) 10 MG tablet Take 1 tablet (10 mg total) by mouth 3 (three) times daily with meals. 90 tablet 0  . multivitamin (RENA-VIT) TABS tablet Take 1 tablet  by mouth at bedtime.    . polyethylene glycol (MIRALAX / GLYCOLAX) packet Take 17 g by mouth daily. 14 each 0  . Probiotic Product (ALIGN) 4 MG CAPS Take 1 capsule by mouth daily.    . sevelamer carbonate (RENVELA) 800 MG tablet Take 2,400-4,000 mg by mouth See admin instructions. Take 5 tablets (4000 mg) by mouth 3 times daily with meals and 3 tablets (2400 mg) 2 times daily with snacks     Current Facility-Administered Medications on File Prior to Visit   Medication Dose Route Frequency Provider Last Rate Last Dose  . Chlorhexidine Gluconate Cloth 2 % PADS 6 each  6 each Topical Once [name], MD       Allergies  Allergen Reactions  . Codeine Other (See Comments)    Makes patient incoherent. STATES MAKES HIM COMATOSE  . Tape Other (See Comments)    Plastic tape tears skin off, please use paper tape instead.    Recent Results (from the past 2160 hour(s))  Culture, blood (Routine x 2)     Status: Abnormal   Collection Time: 11/01/15  6:12 PM  Result Value Ref Range   Specimen Description BLOOD RIGHT FOREARM    Special Requests BOTTLES DRAWN AEROBIC AND ANAEROBIC 5CC    Culture  Setup Time      GRAM NEGATIVE RODS AEROBIC BOTTLE ONLY CRITICAL RESULT CALLED TO, READ BACK BY AND VERIFIED WITH: M TURNER 11/02/15 @ 34 M VESTAL    Culture ESCHERICHIA COLI (A)    Report Status 11/04/2015 FINAL    Organism ID, Bacteria ESCHERICHIA COLI       Susceptibility   Escherichia coli - MIC*    AMPICILLIN <=2 SENSITIVE Sensitive     CEFAZOLIN <=4 SENSITIVE Sensitive     CEFEPIME <=1 SENSITIVE Sensitive     CEFTAZIDIME <=1 SENSITIVE Sensitive     CEFTRIAXONE <=1 SENSITIVE Sensitive     CIPROFLOXACIN >=4 RESISTANT Resistant     GENTAMICIN <=1 SENSITIVE Sensitive     IMIPENEM <=0.25 SENSITIVE Sensitive     TRIMETH/SULFA <=20 SENSITIVE Sensitive     AMPICILLIN/SULBACTAM <=2 SENSITIVE Sensitive     PIP/TAZO <=4 SENSITIVE Sensitive     Extended ESBL NEGATIVE Sensitive     * ESCHERICHIA COLI  Basic metabolic panel     Status: Abnormal   Collection Time: 11/01/15  6:12 PM  Result Value Ref Range   Sodium 144 135 - 145 mmol/L   Potassium 3.6 3.5 - 5.1 mmol/L   Chloride 103 101 - 111 mmol/L   CO2 27 22 - 32 mmol/L   Glucose, Bld 131 (H) 65 - 99 mg/dL   BUN 86 (H) 6 - 20 mg/dL   Creatinine, Ser 6.99 (H) 0.61 - 1.24 mg/dL   Calcium 9.6 8.9 - 10.3 mg/dL   GFR calc non Af Amer 7 (L) >60 mL/min   GFR calc Af Amer 8 (L) >60 mL/min     Comment: (NOTE) The eGFR has been calculated using the CKD EPI equation. This calculation has not been validated in all clinical situations. eGFR's persistently <60 mL/min signify possible Chronic Kidney Disease.    Anion gap 14 5 - 15  Lipase, blood     Status: None   Collection Time: 11/01/15  6:12 PM  Result Value Ref Range   Lipase 28 11 - 51 U/L  Blood Culture ID Panel (Reflexed)     Status: Abnormal   Collection Time: 11/01/15  6:12 PM  Result Value Ref  Range   Enterococcus species NOT DETECTED NOT DETECTED   Vancomycin resistance NOT DETECTED NOT DETECTED   Listeria monocytogenes NOT DETECTED NOT DETECTED   Staphylococcus species NOT DETECTED NOT DETECTED   Staphylococcus aureus NOT DETECTED NOT DETECTED   Methicillin resistance NOT DETECTED NOT DETECTED   Streptococcus species NOT DETECTED NOT DETECTED   Streptococcus agalactiae NOT DETECTED NOT DETECTED   Streptococcus pneumoniae NOT DETECTED NOT DETECTED   Streptococcus pyogenes NOT DETECTED NOT DETECTED   Acinetobacter baumannii NOT DETECTED NOT DETECTED   Enterobacteriaceae species DETECTED (A) NOT DETECTED    Comment: CRITICAL RESULT CALLED TO, READ BACK BY AND VERIFIED WITH: M TURNER 11/02/15 @ 1143 M VESTAL    Enterobacter cloacae complex NOT DETECTED NOT DETECTED   Escherichia coli DETECTED (A) NOT DETECTED    Comment: CRITICAL RESULT CALLED TO, READ BACK BY AND VERIFIED WITH: M TURNER 11/02/15 @ 22 M VESTAL    Klebsiella oxytoca NOT DETECTED NOT DETECTED   Klebsiella pneumoniae NOT DETECTED NOT DETECTED   Proteus species NOT DETECTED NOT DETECTED   Serratia marcescens NOT DETECTED NOT DETECTED   Carbapenem resistance NOT DETECTED NOT DETECTED   Haemophilus influenzae NOT DETECTED NOT DETECTED   Neisseria meningitidis NOT DETECTED NOT DETECTED   Pseudomonas aeruginosa NOT DETECTED NOT DETECTED   Candida albicans NOT DETECTED NOT DETECTED   Candida glabrata NOT DETECTED NOT DETECTED   Candida krusei NOT  DETECTED NOT DETECTED   Candida parapsilosis NOT DETECTED NOT DETECTED   Candida tropicalis NOT DETECTED NOT DETECTED  I-Stat CG4 Lactic Acid, ED     Status: Abnormal   Collection Time: 11/01/15  6:30 PM  Result Value Ref Range   Lactic Acid, Venous 2.06 (HH) 0.5 - 1.9 mmol/L   Comment NOTIFIED PHYSICIAN   Culture, blood (Routine x 2)     Status: None   Collection Time: 11/01/15  6:30 PM  Result Value Ref Range   Specimen Description BLOOD RIGHT HAND    Special Requests BOTTLES DRAWN AEROBIC AND ANAEROBIC 5CC    Culture NO GROWTH 5 DAYS    Report Status 11/06/2015 FINAL   Urinalysis, Routine w reflex microscopic (not at [facility])     Status: Abnormal   Collection Time: 11/01/15  7:17 PM  Result Value Ref Range   Color, Urine YELLOW YELLOW   APPearance CLOUDY (A) CLEAR   Specific Gravity, Urine 1.012 1.005 - 1.030   pH 7.0 5.0 - 8.0   Glucose, UA NEGATIVE NEGATIVE mg/dL   Hgb urine dipstick SMALL (A) NEGATIVE   Bilirubin Urine NEGATIVE NEGATIVE   Ketones, ur NEGATIVE NEGATIVE mg/dL   Protein, ur 100 (A) NEGATIVE mg/dL   Nitrite NEGATIVE NEGATIVE   Leukocytes, UA LARGE (A) NEGATIVE  Urine culture     Status: None   Collection Time: 11/01/15  7:17 PM  Result Value Ref Range   Specimen Description URINE, RANDOM    Special Requests NONE    Culture NO GROWTH    Report Status 11/02/2015 FINAL   Urine microscopic-add on     Status: Abnormal   Collection Time: 11/01/15  7:17 PM  Result Value Ref Range   Squamous Epithelial / LPF 0-5 (A) NONE SEEN   WBC, UA 6-30 0 - 5 WBC/hpf   RBC / HPF 0-5 0 - 5 RBC/hpf   Bacteria, UA MANY (A) NONE SEEN  I-Stat CG4 Lactic Acid, ED  (not at  [facility])     Status: None   Collection Time: 11/01/15  9:09  PM  Result Value Ref Range   Lactic Acid, Venous 1.05 0.5 - 1.9 mmol/L  Lactic acid, plasma     Status: None   Collection Time: 11/01/15  9:51 PM  Result Value Ref Range   Lactic Acid, Venous 1.0 0.5 - 1.9 mmol/L  Procalcitonin     Status: None    Collection Time: 11/01/15 10:00 PM  Result Value Ref Range   Procalcitonin 45.38 ng/mL    Comment:        Interpretation: PCT >= 10 ng/mL: Important systemic inflammatory response, almost exclusively due to severe bacterial sepsis or septic shock. (NOTE)         ICU PCT Algorithm               Non ICU PCT Algorithm    ----------------------------     ------------------------------         PCT < 0.25 ng/mL                 PCT < 0.1 ng/mL     Stopping of antibiotics            Stopping of antibiotics       strongly encouraged.               strongly encouraged.    ----------------------------     ------------------------------       PCT level decrease by               PCT < 0.25 ng/mL       >= 80% from peak PCT       OR PCT 0.25 - 0.5 ng/mL          Stopping of antibiotics                                             encouraged.     Stopping of antibiotics           encouraged.    ----------------------------     ------------------------------       PCT level decrease by              PCT >= 0.25 ng/mL       < 80% from peak PCT        AND PCT >= 0.5 ng/mL             Continuing antibiotics                                              encouraged.       Continuing antibiotics            encouraged.    ----------------------------     ------------------------------     PCT level increase compared          PCT > 0.5 ng/mL         with peak PCT AND          PCT >= 0.5 ng/mL             Escalation of antibiotics  strongly encouraged.      Escalation of antibiotics        strongly encouraged.   Protime-INR     Status: None   Collection Time: 11/01/15 10:00 PM  Result Value Ref Range   Prothrombin Time 14.5 11.4 - 15.2 seconds   INR 1.12   APTT     Status: None   Collection Time: 11/01/15 10:00 PM  Result Value Ref Range   aPTT 31 24 - 36 seconds  CBG monitoring, ED     Status: Abnormal   Collection Time: 11/01/15 10:37 PM  Result Value Ref  Range   Glucose-Capillary 167 (H) 65 - 99 mg/dL  Glucose, capillary     Status: Abnormal   Collection Time: 11/01/15 11:26 PM  Result Value Ref Range   Glucose-Capillary 156 (H) 65 - 99 mg/dL  MRSA PCR Screening     Status: None   Collection Time: 11/02/15 12:09 AM  Result Value Ref Range   MRSA by PCR NEGATIVE NEGATIVE    Comment:        The GeneXpert MRSA Assay (FDA approved for NASAL specimens only), is one component of a comprehensive MRSA colonization surveillance program. It is not intended to diagnose MRSA infection nor to guide or monitor treatment for MRSA infections.   Basic metabolic panel     Status: Abnormal   Collection Time: 11/02/15  4:36 AM  Result Value Ref Range   Sodium 145 135 - 145 mmol/L   Potassium 3.7 3.5 - 5.1 mmol/L   Chloride 106 101 - 111 mmol/L   CO2 28 22 - 32 mmol/L   Glucose, Bld 132 (H) 65 - 99 mg/dL   BUN 90 (H) 6 - 20 mg/dL   Creatinine, Ser 7.68 (H) 0.61 - 1.24 mg/dL   Calcium 9.3 8.9 - 10.3 mg/dL   GFR calc non Af Amer 6 (L) >60 mL/min   GFR calc Af Amer 8 (L) >60 mL/min    Comment: (NOTE) The eGFR has been calculated using the CKD EPI equation. This calculation has not been validated in all clinical situations. eGFR's persistently <60 mL/min signify possible Chronic Kidney Disease.    Anion gap 11 5 - 15  CBC     Status: Abnormal   Collection Time: 11/02/15  4:36 AM  Result Value Ref Range   WBC 5.0 4.0 - 10.5 K/uL   RBC 2.39 (L) 4.22 - 5.81 MIL/uL   Hemoglobin 8.2 (L) 13.0 - 17.0 g/dL    Comment: SPECIMEN CHECKED FOR CLOTS REPEATED TO VERIFY    HCT 25.4 (L) 39.0 - 52.0 %   MCV 106.3 (H) 78.0 - 100.0 fL   MCH 34.3 (H) 26.0 - 34.0 pg   MCHC 32.3 30.0 - 36.0 g/dL   RDW 14.3 11.5 - 15.5 %   Platelets 59 (L) 150 - 400 K/uL    Comment: SPECIMEN CHECKED FOR CLOTS REPEATED TO VERIFY   Glucose, capillary     Status: None   Collection Time: 11/02/15  8:06 AM  Result Value Ref Range   Glucose-Capillary 97 65 - 99 mg/dL   Glucose, capillary     Status: None   Collection Time: 11/02/15 11:33 AM  Result Value Ref Range   Glucose-Capillary 89 65 - 99 mg/dL  Glucose, capillary     Status: None   Collection Time: 11/02/15  6:21 PM  Result Value Ref Range   Glucose-Capillary 76 65 - 99 mg/dL  Glucose, capillary     Status: None  Collection Time: 11/02/15  8:21 PM  Result Value Ref Range   Glucose-Capillary 74 65 - 99 mg/dL  Comprehensive metabolic panel     Status: Abnormal   Collection Time: 11/03/15  5:53 AM  Result Value Ref Range   Sodium 140 135 - 145 mmol/L   Potassium 4.1 3.5 - 5.1 mmol/L   Chloride 100 (L) 101 - 111 mmol/L   CO2 30 22 - 32 mmol/L   Glucose, Bld 82 65 - 99 mg/dL   BUN 39 (H) 6 - 20 mg/dL   Creatinine, Ser 4.78 (H) 0.61 - 1.24 mg/dL    Comment: DELTA CHECK NOTED   Calcium 9.5 8.9 - 10.3 mg/dL   Total Protein 5.1 (L) 6.5 - 8.1 g/dL   Albumin 2.8 (L) 3.5 - 5.0 g/dL   AST 39 15 - 41 U/L   ALT 27 17 - 63 U/L   Alkaline Phosphatase 92 38 - 126 U/L   Total Bilirubin 1.1 0.3 - 1.2 mg/dL   GFR calc non Af Amer 12 (L) >60 mL/min   GFR calc Af Amer 13 (L) >60 mL/min    Comment: (NOTE) The eGFR has been calculated using the CKD EPI equation. This calculation has not been validated in all clinical situations. eGFR's persistently <60 mL/min signify possible Chronic Kidney Disease.    Anion gap 10 5 - 15  Vitamin B12     Status: None   Collection Time: 11/03/15  5:53 AM  Result Value Ref Range   Vitamin B-12 912 180 - 914 pg/mL    Comment: (NOTE) This assay is not validated for testing neonatal or myeloproliferative syndrome specimens for Vitamin B12 levels.   Folate RBC     Status: Abnormal   Collection Time: 11/03/15  5:53 AM  Result Value Ref Range   Folate, Hemolysate 582.8 Not Estab. ng/mL   Hematocrit 25.7 (L) 37.5 - 51.0 %   Folate, RBC 2,268 >498 ng/mL    Comment: (NOTE) Performed At: [facility] [place], [place] [id number] Lindon Romp  MD EO:[id number]   Prealbumin     Status: None   Collection Time: 11/03/15  5:53 AM  Result Value Ref Range   Prealbumin 22.8 18 - 38 mg/dL  Glucose, capillary     Status: None   Collection Time: 11/03/15  7:53 AM  Result Value Ref Range   Glucose-Capillary 72 65 - 99 mg/dL  Glucose, capillary     Status: Abnormal   Collection Time: 11/03/15 12:12 PM  Result Value Ref Range   Glucose-Capillary 64 (L) 65 - 99 mg/dL   Comment 1 Notify RN   Glucose, capillary     Status: Abnormal   Collection Time: 11/03/15 12:51 PM  Result Value Ref Range   Glucose-Capillary 102 (H) 65 - 99 mg/dL  Glucose, capillary     Status: Abnormal   Collection Time: 11/03/15  4:58 PM  Result Value Ref Range   Glucose-Capillary 140 (H) 65 - 99 mg/dL  Glucose, capillary     Status: None   Collection Time: 11/03/15  9:16 PM  Result Value Ref Range   Glucose-Capillary 77 65 - 99 mg/dL   Comment 1 Notify RN   CBC     Status: Abnormal   Collection Time: 11/04/15  7:05 AM  Result Value Ref Range   WBC 2.5 (L) 4.0 - 10.5 K/uL   RBC 2.53 (L) 4.22 - 5.81 MIL/uL   Hemoglobin 8.7 (L) 13.0 - 17.0 g/dL  HCT 25.9 (L) 39.0 - 52.0 %   MCV 102.4 (H) 78.0 - 100.0 fL   MCH 34.4 (H) 26.0 - 34.0 pg   MCHC 33.6 30.0 - 36.0 g/dL   RDW 13.9 11.5 - 15.5 %   Platelets 60 (L) 150 - 400 K/uL    Comment: CONSISTENT WITH PREVIOUS RESULT  Comprehensive metabolic panel     Status: Abnormal   Collection Time: 11/04/15  7:05 AM  Result Value Ref Range   Sodium 142 135 - 145 mmol/L   Potassium 4.6 3.5 - 5.1 mmol/L   Chloride 102 101 - 111 mmol/L   CO2 25 22 - 32 mmol/L   Glucose, Bld 86 65 - 99 mg/dL   BUN 48 (H) 6 - 20 mg/dL   Creatinine, Ser 6.11 (H) 0.61 - 1.24 mg/dL   Calcium 9.3 8.9 - 10.3 mg/dL   Total Protein 5.3 (L) 6.5 - 8.1 g/dL   Albumin 2.9 (L) 3.5 - 5.0 g/dL   AST 38 15 - 41 U/L   ALT 31 17 - 63 U/L   Alkaline Phosphatase 102 38 - 126 U/L   Total Bilirubin 1.2 0.3 - 1.2 mg/dL   GFR calc non Af Amer 9 (L) >60  mL/min   GFR calc Af Amer 10 (L) >60 mL/min    Comment: (NOTE) The eGFR has been calculated using the CKD EPI equation. This calculation has not been validated in all clinical situations. eGFR's persistently <60 mL/min signify possible Chronic Kidney Disease.    Anion gap 15 5 - 15  Phosphorus     Status: Abnormal   Collection Time: 11/04/15  7:05 AM  Result Value Ref Range   Phosphorus 5.0 (H) 2.5 - 4.6 mg/dL  I-STAT 4, (NA,K, GLUC, HGB,HCT)     Status: Abnormal   Collection Time: 11/04/15 11:53 AM  Result Value Ref Range   Sodium 140 135 - 145 mmol/L   Potassium 3.1 (L) 3.5 - 5.1 mmol/L   Glucose, Bld 72 65 - 99 mg/dL   HCT 24.0 (L) 39.0 - 52.0 %   Hemoglobin 8.2 (L) 13.0 - 17.0 g/dL  Glucose, capillary     Status: None   Collection Time: 11/04/15  1:43 PM  Result Value Ref Range   Glucose-Capillary 67 65 - 99 mg/dL  Glucose, capillary     Status: None   Collection Time: 11/04/15  2:27 PM  Result Value Ref Range   Glucose-Capillary 78 65 - 99 mg/dL  Glucose, capillary     Status: None   Collection Time: 11/04/15  4:36 PM  Result Value Ref Range   Glucose-Capillary 82 65 - 99 mg/dL  Glucose, capillary     Status: Abnormal   Collection Time: 11/04/15  8:17 PM  Result Value Ref Range   Glucose-Capillary 113 (H) 65 - 99 mg/dL  CBC     Status: Abnormal   Collection Time: 11/05/15  6:38 AM  Result Value Ref Range   WBC 2.1 (L) 4.0 - 10.5 K/uL   RBC 2.52 (L) 4.22 - 5.81 MIL/uL   Hemoglobin 8.7 (L) 13.0 - 17.0 g/dL   HCT 25.8 (L) 39.0 - 52.0 %   MCV 102.4 (H) 78.0 - 100.0 fL   MCH 34.5 (H) 26.0 - 34.0 pg   MCHC 33.7 30.0 - 36.0 g/dL   RDW 13.7 11.5 - 15.5 %   Platelets 62 (L) 150 - 400 K/uL    Comment: CONSISTENT WITH PREVIOUS RESULT  Glucose, capillary  Status: Abnormal   Collection Time: 11/05/15  7:23 AM  Result Value Ref Range   Glucose-Capillary 135 (H) 65 - 99 mg/dL  Glucose, capillary     Status: Abnormal   Collection Time: 11/05/15 12:01 PM  Result Value  Ref Range   Glucose-Capillary 151 (H) 65 - 99 mg/dL    Objective: General: Patient is awake, alert, and oriented x 3 and in no acute distress. Patient is blind (both eyes removed).   Integument: Skin is warm and supple bilateral. Nails are tender, long, thickened and dystrophic with subungual debris, consistent with onychomycosis, 1-5 on left. Superfical abrasions to left hallux, 2nd and 3rd toes distal aspects healed with No signs of infection, improving in nature. Scaly skin 1st webspace and callus minimal at left heel; No preulcerative lesions present bilateral. Remaining integument unremarkable.  Vasculature:  Dorsalis Pedis pulse 1/4 bilateral. Posterior Tibial pulse  1/4 bilateral faint, Capillary fill time <5 sec 1-5 left. No hair growth to the level of the digits. Temperature gradient decreased. No varicosities present bilateral. No edema present bilateral.   Neurology: The patient has absent sensation measured with a 5.07/10g [place] Weinstein Monofilament at all pedal sites on left . Vibratory sensation diminished bilateral ankles with tuning fork.   Musculoskeletal: Right TMA. Left mild lesser hammertoes. Muscular strength within normal limits for patient status. No tenderness with calf compression bilateral.  Assessment and Plan: Problem List Items Addressed This Visit    None    Visit Diagnoses    Dermatophytosis of nail    -  Primary   Relevant Medications   clotrimazole (LOTRIMIN) 1 % external solution   Diabetic polyneuropathy associated with type 2 diabetes mellitus (HCC)       Foot pain, unspecified laterality       PVD (peripheral vascular disease) (HCC)       Foot amputation status, right (HCC)       Multiple abrasions       Tinea       Relevant Medications   clotrimazole (LOTRIMIN) 1 % external solution      -Examined patient. -Discussed and educated patient on diabetic foot care, especially with  regards to the vascular, neurological and musculoskeletal  systems.  -Stressed the importance of good glycemic control and the detriment of not  controlling glucose levels in relation to the foot. -Mechanically debrided all nails 1-5 on left using sterile nail nipper without incident  -Rx Clotrimazole possible tinea in 1st webspace on left -Vasaline to callus of heel on left -Cont with Custom molded shoes and custom inserts -Answered all patient questions -Patient to return in 3 months for at risk foot care -Patient advised to call the office if any problems or questions arise in the meantime.  Landis Martins, DPM

## 2015-12-17 ENCOUNTER — Encounter: Payer: Self-pay | Admitting: Vascular Surgery

## 2015-12-23 ENCOUNTER — Ambulatory Visit (INDEPENDENT_AMBULATORY_CARE_PROVIDER_SITE_OTHER): Payer: Medicare Other | Admitting: Vascular Surgery

## 2015-12-23 ENCOUNTER — Encounter: Payer: Self-pay | Admitting: Vascular Surgery

## 2015-12-23 VITALS — BP 90/56 | HR 57 | Ht 76.0 in | Wt 200.0 lb

## 2015-12-23 DIAGNOSIS — N186 End stage renal disease: Secondary | ICD-10-CM | POA: Diagnosis not present

## 2015-12-23 DIAGNOSIS — Z992 Dependence on renal dialysis: Secondary | ICD-10-CM | POA: Diagnosis not present

## 2015-12-23 NOTE — Progress Notes (Signed)
Duplicate note

## 2015-12-23 NOTE — Progress Notes (Signed)
Patient name: [name] MRN: [id number] DOB: [birth year] Sex: male  REASON FOR VISIT: Evaluate for new hemodialysis access. Referred by Dr. Jimmy [name].  HPI: [name] is a 65 y.o. male who I last saw on 09/30/2015. The patient underwent a fistulogram by [facility]. This showed evidence of a stenosis at the arterial anastomosis and he was set up for a revision. This was perform I Dr. Adele [name] who noted that the artery was significantly calcified and they were really no options to revise or address the proximal stenosis. When I saw the patient on 09/30/2015, I called the dialysis center and they said that the flows in the fistula have been good. They stated that the fistula was working well. I explained that if there were problems in the future there was really no way to salvage the left forearm fistula and the best option would be to convert it to a left brachiocephalic fistula and place a temporary catheter. I did look at the upper arm cephalic vein in the office myself and it appeared to be reasonable in size.  Reportedly now they're having problems with his fistula he was sent to be evaluated for new access. He denies pain or paresthesias in the left arm.  Current Outpatient Prescriptions  Medication Sig Dispense Refill  . aspirin EC 81 MG tablet Take 81 mg by mouth at bedtime.    [name] atorvastatin (LIPITOR) 10 MG tablet TAKE 1 TABLET BY MOUTH EVERY DAY (Patient taking differently: TAKE 1 TABLET BY MOUTH DAILY AT BEDTIME) 30 tablet 6  . calcitRIOL (ROCALTROL) 0.25 MCG capsule Take 0.25 mcg by mouth 3 (three) times a week. Reported on 08/26/2015    . cinacalcet (SENSIPAR) 30 MG tablet Take 30 mg by mouth daily with supper.    . clotrimazole (LOTRIMIN) 1 % external solution Apply 1 application topically 2 (two) times daily. In between toes 60 mL 5  . heparin 5000 UNIT/ML injection Inject 8,000 Units into the skin 3 (three) times a week. Monday, Wednesday, and Friday.    . Methoxy  PEG-Epoetin Beta (MIRCERA) 50 MCG/0.3ML SOSY Inject 225 mcg as directed every 14 (fourteen) days.     . midodrine (PROAMATINE) 10 MG tablet Take 1 tablet (10 mg total) by mouth 3 (three) times daily with meals. 90 tablet 0  . multivitamin (RENA-VIT) TABS tablet Take 1 tablet by mouth at bedtime.    . Probiotic Product (ALIGN) 4 MG CAPS Take 1 capsule by mouth daily.    . sevelamer carbonate (RENVELA) 800 MG tablet Take 2,400-4,000 mg by mouth See admin instructions. Take 5 tablets (4000 mg) by mouth 3 times daily with meals and 3 tablets (2400 mg) 2 times daily with snacks    . acetaminophen (TYLENOL) 325 MG tablet Take 1-2 tablets (325-650 mg total) by mouth every 6 (six) hours as needed for moderate pain. (Patient not taking: Reported on 12/23/2015)    . polyethylene glycol (MIRALAX / GLYCOLAX) packet Take 17 g by mouth daily. (Patient not taking: Reported on 12/23/2015) 14 each 0   No current facility-administered medications for this visit.    Facility-Administered Medications Ordered in Other Visits  Medication Dose Route Frequency Provider Last Rate Last Dose  . Chlorhexidine Gluconate Cloth 2 % PADS 6 each  6 each Topical Once Nicholas Dutch, MD        REVIEW OF SYSTEMS:  [X]  denotes positive finding, [ ]  denotes negative finding Cardiac  Comments:  Chest pain or chest pressure:  Shortness of breath upon exertion:    Short of breath when lying flat:    Irregular heart rhythm:    Constitutional    Fever or chills:      PHYSICAL EXAM: Vitals:   12/23/15 1423  BP: (!) 90/56  Pulse: (!) 57  SpO2: 98%  Weight: 200 lb (90.7 kg)  Height: 6\' 4"  (1.93 m)    GENERAL: The patient is a well-nourished male, in no acute distress. The vital signs are documented above. CARDIOVASCULAR: There is a regular rate and rhythm. PULMONARY: There is good air exchange bilaterally without wheezing or rales. His left forearm fistula has a weak thrill. He has a monophasic left radial signal with  the Doppler.  MEDICAL ISSUES:  POORLY FUNCTIONING LEFT RADIOCEPHALIC AV FISTULA: I have recommended placement of a left brachiocephalic AV fistula, ligation of his left radiocephalic fistula, and placement of a tunneled dialysis catheter. Hopefully they would be able to use a new fistula in about 2 months. He has had frequent problems with infections and therefore would like to get the catheter out as soon as possible once his fistula is working well. He dialyzes on Monday Wednesdays and Fridays and his surgery has been scheduled for Tuesday, 01/05/2016, a nondialysis day. We have discussed the procedure potential complications and he is agreeable to proceed. All of his questions were answered.  [name] [facility] [phone]

## 2015-12-24 ENCOUNTER — Other Ambulatory Visit: Payer: Self-pay

## 2015-12-30 ENCOUNTER — Encounter: Payer: Self-pay | Admitting: Nephrology

## 2016-01-04 ENCOUNTER — Encounter (HOSPITAL_COMMUNITY): Payer: Self-pay | Admitting: *Deleted

## 2016-01-04 NOTE — Progress Notes (Signed)
Spoke with pt's wife, [name] for pre-op call. Pt is at dialysis today. Pt has hx of CAD with CABG in 2013. Sees Dr. Percival Spanish, [name] states pt has not complained of any recent chest pain or sob. Pt is diabetic, not on any medications. They do not check blood sugar at home, she states they do it at dialysis and they have been fine. Pt is blind (has prosthetic eyes).

## 2016-01-11 ENCOUNTER — Encounter (HOSPITAL_COMMUNITY): Payer: Self-pay | Admitting: *Deleted

## 2016-01-12 ENCOUNTER — Encounter (HOSPITAL_COMMUNITY)
Admission: RE | Disposition: A | Discharge status: Home / Self Care | Payer: Self-pay | Source: Ambulatory Visit | Attending: Vascular Surgery

## 2016-01-12 ENCOUNTER — Ambulatory Visit (HOSPITAL_COMMUNITY)
Admission: RE | Admit: 2016-01-12 | Discharge: 2016-01-12 | Disposition: A | Discharge status: Home / Self Care | Payer: Medicare Other | Source: Ambulatory Visit | Attending: Vascular Surgery | Admitting: Vascular Surgery

## 2016-01-12 ENCOUNTER — Ambulatory Visit (HOSPITAL_COMMUNITY): Payer: Medicare Other

## 2016-01-12 ENCOUNTER — Ambulatory Visit (HOSPITAL_COMMUNITY): Payer: Medicare Other | Admitting: Anesthesiology

## 2016-01-12 ENCOUNTER — Encounter (HOSPITAL_COMMUNITY): Payer: Self-pay | Admitting: Urology

## 2016-01-12 DIAGNOSIS — Z951 Presence of aortocoronary bypass graft: Secondary | ICD-10-CM | POA: Insufficient documentation

## 2016-01-12 DIAGNOSIS — I2510 Atherosclerotic heart disease of native coronary artery without angina pectoris: Secondary | ICD-10-CM | POA: Diagnosis not present

## 2016-01-12 DIAGNOSIS — Z970 Presence of artificial eye: Secondary | ICD-10-CM | POA: Diagnosis not present

## 2016-01-12 DIAGNOSIS — E1122 Type 2 diabetes mellitus with diabetic chronic kidney disease: Secondary | ICD-10-CM | POA: Diagnosis not present

## 2016-01-12 DIAGNOSIS — N186 End stage renal disease: Secondary | ICD-10-CM | POA: Diagnosis not present

## 2016-01-12 DIAGNOSIS — I120 Hypertensive chronic kidney disease with stage 5 chronic kidney disease or end stage renal disease: Secondary | ICD-10-CM | POA: Diagnosis not present

## 2016-01-12 DIAGNOSIS — Z992 Dependence on renal dialysis: Secondary | ICD-10-CM | POA: Diagnosis not present

## 2016-01-12 DIAGNOSIS — Z419 Encounter for procedure for purposes other than remedying health state, unspecified: Secondary | ICD-10-CM

## 2016-01-12 DIAGNOSIS — T82898A Other specified complication of vascular prosthetic devices, implants and grafts, initial encounter: Secondary | ICD-10-CM | POA: Diagnosis not present

## 2016-01-12 DIAGNOSIS — Z95828 Presence of other vascular implants and grafts: Secondary | ICD-10-CM

## 2016-01-12 DIAGNOSIS — H547 Unspecified visual loss: Secondary | ICD-10-CM | POA: Diagnosis not present

## 2016-01-12 DIAGNOSIS — N185 Chronic kidney disease, stage 5: Secondary | ICD-10-CM

## 2016-01-12 HISTORY — PX: INSERTION OF DIALYSIS CATHETER: SHX1324

## 2016-01-12 HISTORY — DX: Type 2 diabetes mellitus with diabetic neuropathy, unspecified: E11.40

## 2016-01-12 HISTORY — PX: AV FISTULA PLACEMENT: SHX1204

## 2016-01-12 HISTORY — PX: LIGATION OF ARTERIOVENOUS  FISTULA: SHX5948

## 2016-01-12 LAB — GLUCOSE, CAPILLARY: GLUCOSE-CAPILLARY: 120 mg/dL — AB (ref 65–99)

## 2016-01-12 LAB — POCT I-STAT 4, (NA,K, GLUC, HGB,HCT)
Glucose, Bld: 120 mg/dL — ABNORMAL HIGH (ref 65–99)
HCT: 29 % — ABNORMAL LOW (ref 39.0–52.0)
Hemoglobin: 9.9 g/dL — ABNORMAL LOW (ref 13.0–17.0)
Potassium: 4.3 mmol/L (ref 3.5–5.1)
Sodium: 138 mmol/L (ref 135–145)

## 2016-01-12 SURGERY — ARTERIOVENOUS (AV) FISTULA CREATION
Anesthesia: Monitor Anesthesia Care | Site: Chest | Laterality: Right

## 2016-01-12 MED ORDER — PHENYLEPHRINE HCL 10 MG/ML IJ SOLN
INTRAVENOUS | Status: DC | PRN
  Administered 2016-01-12: 30 ug/min via INTRAVENOUS

## 2016-01-12 MED ORDER — MIDAZOLAM HCL 5 MG/5ML IJ SOLN
INTRAMUSCULAR | Status: DC | PRN
  Administered 2016-01-12 (×2): 1 mg via INTRAVENOUS

## 2016-01-12 MED ORDER — PROPOFOL 10 MG/ML IV BOLUS
INTRAVENOUS | Status: AC
  Filled 2016-01-12: qty 20

## 2016-01-12 MED ORDER — PROTAMINE SULFATE 10 MG/ML IV SOLN
INTRAVENOUS | Status: DC | PRN
  Administered 2016-01-12: 20 mg via INTRAVENOUS
  Administered 2016-01-12: 40 mg via INTRAVENOUS

## 2016-01-12 MED ORDER — SODIUM CHLORIDE 0.9 % IV SOLN: INTRAVENOUS | Status: DC

## 2016-01-12 MED ORDER — SODIUM CHLORIDE 0.9 % IV SOLN
INTRAVENOUS | Status: DC | PRN
  Administered 2016-01-12: 500 mL

## 2016-01-12 MED ORDER — FENTANYL CITRATE (PF) 100 MCG/2ML IJ SOLN: 25.0000 ug | INTRAMUSCULAR | Status: DC | PRN

## 2016-01-12 MED ORDER — FENTANYL CITRATE (PF) 100 MCG/2ML IJ SOLN
INTRAMUSCULAR | Status: DC | PRN
  Administered 2016-01-12 (×4): 25 ug via INTRAVENOUS

## 2016-01-12 MED ORDER — LIDOCAINE HCL (PF) 1 % IJ SOLN
INTRAMUSCULAR | Status: AC
  Filled 2016-01-12: qty 30

## 2016-01-12 MED ORDER — ONDANSETRON HCL 4 MG/2ML IJ SOLN: 4.0000 mg | Freq: Once | INTRAMUSCULAR | Status: DC | PRN

## 2016-01-12 MED ORDER — HEPARIN SODIUM (PORCINE) 1000 UNIT/ML IJ SOLN
INTRAMUSCULAR | Status: DC | PRN
  Administered 2016-01-12: 3.4 mL via INTRAVENOUS

## 2016-01-12 MED ORDER — PROPOFOL 500 MG/50ML IV EMUL
INTRAVENOUS | Status: DC | PRN
  Administered 2016-01-12: 12:00:00 via INTRAVENOUS
  Administered 2016-01-12: 50 ug/kg/min via INTRAVENOUS

## 2016-01-12 MED ORDER — MIDAZOLAM HCL 2 MG/2ML IJ SOLN
INTRAMUSCULAR | Status: AC
  Filled 2016-01-12: qty 2

## 2016-01-12 MED ORDER — OXYCODONE-ACETAMINOPHEN 5-325 MG PO TABS: 1.0000 | ORAL_TABLET | Freq: Four times a day (QID) | ORAL | 0 refills | Status: DC | PRN

## 2016-01-12 MED ORDER — FENTANYL CITRATE (PF) 100 MCG/2ML IJ SOLN
INTRAMUSCULAR | Status: AC
  Filled 2016-01-12: qty 2

## 2016-01-12 MED ORDER — 0.9 % SODIUM CHLORIDE (POUR BTL) OPTIME
TOPICAL | Status: DC | PRN
  Administered 2016-01-12: 1000 mL

## 2016-01-12 MED ORDER — PAPAVERINE HCL 30 MG/ML IJ SOLN
INTRAMUSCULAR | Status: AC
  Filled 2016-01-12: qty 2

## 2016-01-12 MED ORDER — EPHEDRINE SULFATE 50 MG/ML IJ SOLN
INTRAMUSCULAR | Status: DC | PRN
  Administered 2016-01-12: 10 mg via INTRAVENOUS

## 2016-01-12 MED ORDER — LIDOCAINE-EPINEPHRINE (PF) 1 %-1:200000 IJ SOLN
INTRAMUSCULAR | Status: AC
  Filled 2016-01-12: qty 30

## 2016-01-12 MED ORDER — LIDOCAINE-EPINEPHRINE (PF) 1 %-1:200000 IJ SOLN
INTRAMUSCULAR | Status: DC | PRN
  Administered 2016-01-12: 13 mL

## 2016-01-12 MED ORDER — HEPARIN SODIUM (PORCINE) 1000 UNIT/ML IJ SOLN
INTRAMUSCULAR | Status: AC
  Filled 2016-01-12: qty 1

## 2016-01-12 MED ORDER — PROPOFOL 10 MG/ML IV BOLUS
INTRAVENOUS | Status: DC | PRN
  Administered 2016-01-12: 20 mg via INTRAVENOUS
  Administered 2016-01-12: 40 mg via INTRAVENOUS
  Administered 2016-01-12: 30 mg via INTRAVENOUS

## 2016-01-12 MED ORDER — DEXTROSE 5 % IV SOLN
1.5000 g | INTRAVENOUS | Status: AC
  Administered 2016-01-12: 1.5 g via INTRAVENOUS
  Filled 2016-01-12: qty 1.5

## 2016-01-12 MED ORDER — SODIUM CHLORIDE 0.9 % IV SOLN
INTRAVENOUS | Status: DC
  Administered 2016-01-12 (×2): via INTRAVENOUS

## 2016-01-12 MED ORDER — LIDOCAINE HCL (PF) 1 % IJ SOLN
INTRAMUSCULAR | Status: DC | PRN
  Administered 2016-01-12: 9 mL

## 2016-01-12 MED ORDER — CHLORHEXIDINE GLUCONATE CLOTH 2 % EX PADS: 6.0000 | MEDICATED_PAD | Freq: Once | CUTANEOUS | Status: DC

## 2016-01-12 MED ORDER — HEPARIN SODIUM (PORCINE) 1000 UNIT/ML IJ SOLN
INTRAMUSCULAR | Status: DC | PRN
  Administered 2016-01-12: 7 mL via INTRAVENOUS

## 2016-01-12 SURGICAL SUPPLY — 67 items
ADH SKN CLS APL DERMABOND .7 (GAUZE/BANDAGES/DRESSINGS) ×6
ARMBAND PINK RESTRICT EXTREMIT (MISCELLANEOUS) ×10 IMPLANT
BAG DECANTER FOR FLEXI CONT (MISCELLANEOUS) ×3 IMPLANT
BIOPATCH RED 1 DISK 7.0 (GAUZE/BANDAGES/DRESSINGS) ×4 IMPLANT
BIOPATCH RED 1IN DISK 7.0MM (GAUZE/BANDAGES/DRESSINGS) ×1
CANISTER SUCTION 2500CC (MISCELLANEOUS) ×5 IMPLANT
CANNULA VESSEL 3MM 2 BLNT TIP (CANNULA) ×5 IMPLANT
CATH PALINDROME RT-P 15FX19CM (CATHETERS) IMPLANT
CATH PALINDROME RT-P 15FX23CM (CATHETERS) ×2 IMPLANT
CATH PALINDROME RT-P 15FX28CM (CATHETERS) IMPLANT
CATH PALINDROME RT-P 15FX55CM (CATHETERS) IMPLANT
CHLORAPREP W/TINT 26ML (MISCELLANEOUS) ×5 IMPLANT
CLIP TI MEDIUM 6 (CLIP) ×5 IMPLANT
CLIP TI WIDE RED SMALL 6 (CLIP) ×7 IMPLANT
COVER PROBE W GEL 5X96 (DRAPES) ×2 IMPLANT
COVER SURGICAL LIGHT HANDLE (MISCELLANEOUS) ×3 IMPLANT
DECANTER SPIKE VIAL GLASS SM (MISCELLANEOUS) ×3 IMPLANT
DERMABOND ADVANCED (GAUZE/BANDAGES/DRESSINGS) ×4
DERMABOND ADVANCED .7 DNX12 (GAUZE/BANDAGES/DRESSINGS) IMPLANT
DRAPE C-ARM 42X72 X-RAY (DRAPES) ×5 IMPLANT
DRAPE CHEST BREAST 15X10 FENES (DRAPES) ×5 IMPLANT
ELECT REM PT RETURN 9FT ADLT (ELECTROSURGICAL) ×5
ELECTRODE REM PT RTRN 9FT ADLT (ELECTROSURGICAL) ×3 IMPLANT
GAUZE SPONGE 2X2 8PLY STRL LF (GAUZE/BANDAGES/DRESSINGS) IMPLANT
GAUZE SPONGE 4X4 12PLY STRL (GAUZE/BANDAGES/DRESSINGS) ×5 IMPLANT
GAUZE SPONGE 4X4 16PLY XRAY LF (GAUZE/BANDAGES/DRESSINGS) ×3 IMPLANT
GEL ULTRASOUND 20GR AQUASONIC (MISCELLANEOUS) IMPLANT
GLOVE BIO SURGEON STRL SZ 6.5 (GLOVE) ×2 IMPLANT
GLOVE BIO SURGEON STRL SZ7.5 (GLOVE) ×7 IMPLANT
GLOVE BIO SURGEONS STRL SZ 6.5 (GLOVE) ×2
GLOVE BIOGEL PI IND STRL 6.5 (GLOVE) IMPLANT
GLOVE BIOGEL PI IND STRL 8 (GLOVE) ×3 IMPLANT
GLOVE BIOGEL PI INDICATOR 6.5 (GLOVE) ×8
GLOVE BIOGEL PI INDICATOR 8 (GLOVE) ×4
GLOVE SURG SS PI 6.5 STRL IVOR (GLOVE) ×4 IMPLANT
GOWN STRL REUS W/ TWL LRG LVL3 (GOWN DISPOSABLE) ×9 IMPLANT
GOWN STRL REUS W/TWL LRG LVL3 (GOWN DISPOSABLE) ×25
KIT BASIN OR (CUSTOM PROCEDURE TRAY) ×5 IMPLANT
KIT ROOM TURNOVER OR (KITS) ×5 IMPLANT
LIQUID BAND (GAUZE/BANDAGES/DRESSINGS) ×5 IMPLANT
NDL 18GX1X1/2 (RX/OR ONLY) (NEEDLE) ×3 IMPLANT
NDL HYPO 25GX1X1/2 BEV (NEEDLE) ×3 IMPLANT
NEEDLE 18GX1X1/2 (RX/OR ONLY) (NEEDLE) ×5 IMPLANT
NEEDLE 22X1 1/2 (OR ONLY) (NEEDLE) ×5 IMPLANT
NEEDLE HYPO 25GX1X1/2 BEV (NEEDLE) ×5 IMPLANT
NS IRRIG 1000ML POUR BTL (IV SOLUTION) ×5 IMPLANT
PACK CV ACCESS (CUSTOM PROCEDURE TRAY) ×5 IMPLANT
PACK SURGICAL SETUP 50X90 (CUSTOM PROCEDURE TRAY) ×3 IMPLANT
PAD ARMBOARD 7.5X6 YLW CONV (MISCELLANEOUS) ×10 IMPLANT
SPONGE GAUZE 2X2 STER 10/PKG (GAUZE/BANDAGES/DRESSINGS)
SPONGE GAUZE 4X4 12PLY STER LF (GAUZE/BANDAGES/DRESSINGS) ×2 IMPLANT
SPONGE SURGIFOAM ABS GEL 100 (HEMOSTASIS) IMPLANT
SUT ETHILON 3 0 PS 1 (SUTURE) ×5 IMPLANT
SUT PROLENE 6 0 BV (SUTURE) ×7 IMPLANT
SUT SILK 0 TIES 10X30 (SUTURE) ×5 IMPLANT
SUT VIC AB 3-0 SH 27 (SUTURE) ×10
SUT VIC AB 3-0 SH 27X BRD (SUTURE) ×3 IMPLANT
SUT VICRYL 4-0 PS2 18IN ABS (SUTURE) ×9 IMPLANT
SWAB COLLECTION DEVICE MRSA (MISCELLANEOUS) IMPLANT
SYR 20CC LL (SYRINGE) ×10 IMPLANT
SYR 5ML LL (SYRINGE) ×8 IMPLANT
SYR CONTROL 10ML LL (SYRINGE) ×5 IMPLANT
SYRINGE 10CC LL (SYRINGE) ×5 IMPLANT
TAPE CLOTH SURG 4X10 WHT LF (GAUZE/BANDAGES/DRESSINGS) ×2 IMPLANT
TUBE ANAEROBIC SPECIMEN COL (MISCELLANEOUS) IMPLANT
UNDERPAD 30X30 (UNDERPADS AND DIAPERS) ×5 IMPLANT
WATER STERILE IRR 1000ML POUR (IV SOLUTION) ×5 IMPLANT

## 2016-01-12 NOTE — Anesthesia Postprocedure Evaluation (Signed)
Anesthesia Post Note  Patient: [name]  Procedure(s) Performed: Procedure(s) (LRB): LEFT BRACHIOCEPHALIC ARTERIOVENOUS (AV) FISTULA CREATION (Left) LIGATION OF LEFT RADIOCEPHALIC ARTERIOVENOUS  FISTULA (Left) INSERTION OF DIALYSIS CATHETER (Right)  Patient location during evaluation: PACU Anesthesia Type: MAC Level of consciousness: awake and alert Pain management: pain level controlled Vital Signs Assessment: post-procedure vital signs reviewed and stable Respiratory status: spontaneous breathing, nonlabored ventilation, respiratory function stable and patient connected to nasal cannula oxygen Cardiovascular status: stable and blood pressure returned to baseline Anesthetic complications: no    Last Vitals:  Vitals:   01/12/16 0914 01/12/16 1300  BP: 123/68   Pulse: (!) 56   Resp: 20   Temp: 36.4 C (P) 36.4 C    Last Pain:  Vitals:   01/12/16 1300  PainSc: (P) 0-No pain                 Zenaida Deed

## 2016-01-12 NOTE — Anesthesia Procedure Notes (Signed)
Procedure Name: MAC Date/Time: 01/12/2016 10:45 AM Performed by: Lance Coon Pre-anesthesia Checklist: Patient identified, Patient being monitored, Timeout performed, Suction available and Emergency Drugs available Patient Re-evaluated:Patient Re-evaluated prior to inductionOxygen Delivery Method: Simple face mask Preoxygenation: Pre-oxygenation with 100% oxygen Intubation Type: IV induction

## 2016-01-12 NOTE — Interval H&P Note (Signed)
History and Physical Interval Note:  01/12/2016 10:25 AM  [name]  has presented today for surgery, with the diagnosis of End Stage Renal Disease N18.6  The various methods of treatment have been discussed with the patient and family. After consideration of risks, benefits and other options for treatment, the patient has consented to  Procedure(s): BRACHIOCEPHALIC ARTERIOVENOUS (AV) FISTULA CREATION (Left) LIGATION OF RADIOCEPHALIC ARTERIOVENOUS  FISTULA (Left) INSERTION OF DIALYSIS CATHETER (N/A) as a surgical intervention .  The patient's history has been reviewed, patient examined, no change in status, stable for surgery.  I have reviewed the patient's chart and labs.  Questions were answered to the patient's satisfaction.     [name]

## 2016-01-12 NOTE — H&P (View-Only) (Signed)
Duplicate note

## 2016-01-12 NOTE — Anesthesia Preprocedure Evaluation (Addendum)
Anesthesia Evaluation  Patient identified by MRN, date of birth, ID band Patient awake    Reviewed: Allergy & Precautions, NPO status , Patient's Chart, lab work & pertinent test results  Airway Mallampati: II  TM Distance: >3 FB Neck ROM: Full    Dental  (+) Teeth Intact, Dental Advisory Given   Pulmonary neg pneumonia ,    breath sounds clear to auscultation       Cardiovascular hypertension, + CAD, + CABG and + Peripheral Vascular Disease  + dysrhythmias  Rhythm:Regular Rate:Normal  Echo in 2015 with EF 55%   Neuro/Psych PSYCHIATRIC DISORDERS Depression negative neurological ROS     GI/Hepatic negative GI ROS, Neg liver ROS,   Endo/Other  diabetes, Type 2, Insulin Dependent  Renal/GU ESRF and DialysisRenal disease  negative genitourinary   Musculoskeletal  (+) Arthritis ,   Abdominal   Peds negative pediatric ROS (+)  Hematology  (+) anemia , Hgb 8.2   Anesthesia Other Findings - Blind (both eyes) - HLD   Reproductive/Obstetrics negative OB ROS                            Anesthesia Physical  Anesthesia Plan  ASA: III  Anesthesia Plan: MAC   Post-op Pain Management:    Induction: Intravenous  Airway Management Planned: Simple Face Mask  Additional Equipment:   Intra-op Plan:   Post-operative Plan:   Informed Consent: I have reviewed the patients History and Physical, chart, labs and discussed the procedure including the risks, benefits and alternatives for the proposed anesthesia with the patient or authorized representative who has indicated his/her understanding and acceptance.   Dental advisory given  Plan Discussed with: CRNA, Anesthesiologist and Surgeon  Anesthesia Plan Comments:         Anesthesia Quick Evaluation

## 2016-01-12 NOTE — Discharge Instructions (Signed)
° ° °  01/12/2016 [name] [id number] [birth year]  Surgeon(s): Angelia Mould, MD  Procedure(s): LEFT BRACHIOCEPHALIC ARTERIOVENOUS (AV) FISTULA CREATION LIGATION OF LEFT RADIOCEPHALIC ARTERIOVENOUS  FISTULA INSERTION OF DIALYSIS CATHETER  x Do not stick fiistula for 8 weeks

## 2016-01-12 NOTE — Op Note (Signed)
    NAME: [name]   MRN: [id number] DOB: 08/11/1950    DATE OF OPERATION: 01/12/2016  PREOP DIAGNOSIS: End-stage renal disease  POSTOP DIAGNOSIS: Same  PROCEDURE:  1. Ultrasound-guided placement of right IJ tunneled dialysis catheter 2. New left brachiocephalic AV fistula 3. Ligation of left radiocephalic fistula  SURGEON: Judeth Cornfield. Scot Dock, MD, FACS  ASSIST: Leontine Locket, PA  ANESTHESIA: Local with sedation   EBL: Minimal  INDICATIONS: [name] is a 65 y.o. male  Who has a poorly functioning left radiocephalic fistula. This could not be revised. I recommended conversion to an upper arm fistula and placement of tunneled dialysis catheter until this was ready for access.  FINDINGS:  A 4 mm upper arm cephalic vein  TECHNIQUE:  The patient was taken to the operating room and was sedated. The neck and upper chest were prepped and draped in usual sterile fashion. Under ultrasound guidance, after the skin was anesthetized, the right IJ was cannulated and a guidewire introduced into the right atrium under fluoroscopic control. The tract of the wire was dilated and the dilator and peel-away sheath were advanced over the wire. The wire and dilator were removed. The 23 cm catheter was passed through the peel-away sheath and the peel-away sheath was removed. The exit site Was selected and the skin anesthetized between the 2 areas. The catheter was then brought to the tunnel cut to the appropriate length and the distal ports were attached. The catheter was secured at its exit site with 3-0 nylon suture. The IJ cannulation site was closed with 4-0 subcuticular stitch. Both ports were easily with an flush with heparin saline and filled with concentrated heparin. Sterile dressing was applied.  Attention was then turned to the left arm. The left upper extremity was prepped and draped in usual sterile fashion. The vein was lateral and the artery was quite medial. I therefore elected  make a separate incision over the vein and artery. The skin was anesthetized. A longitudinal incision was made over the cephalic vein and this was dissected free was a good size vein. Vein was ligated distally and irrigated up with heparinized saline. The brachial artery was dissected free beneath the fascia through a separate longitudinal incision. The artery was somewhat calcified but I was able to clamp this. Once the heparin had circulated, the brachial artery was clamped proximally and distally and longitudinal arteriotomy was made. The vein was sewn end-to-side to the artery after was brought through a tunnel. At the completion was excellent thrill in the fistula.   I then anesthetized the skin overlying the proximal fistula at the wrist. A small incision was made and here the vein was dissected free and ligated with 2-0 silk tie. At the completion there was a good radial signal with the Doppler in the wrist. Hemostasis was obtained in the wound. The incision at the wrist was closed with a 4-0 Vicryl. The incision over the cephalic vein was close the deeper 3-0 Vicryl and the skin closed with 4-0 Vicryl. The incision of the brachial artery was closed with a deeper 3-0 Vicryl and the skin closed with 4-0 Vicryl. Dermabond was applied. The patient tolerated the procedure well and was transferred to the recovery room in stable condition. All needle and sponge counts were correct.  [name], MD, FACS [facility]  DATE OF DICTATION:   01/12/2016

## 2016-01-12 NOTE — Transfer of Care (Signed)
Immediate Anesthesia Transfer of Care Note  Patient: Pratham Cassatt Mcgroarty  Procedure(s) Performed: Procedure(s): LEFT BRACHIOCEPHALIC ARTERIOVENOUS (AV) FISTULA CREATION (Left) LIGATION OF LEFT RADIOCEPHALIC ARTERIOVENOUS  FISTULA (Left) INSERTION OF DIALYSIS CATHETER (Right)  Patient Location: PACU  Anesthesia Type:MAC  Level of Consciousness: awake, alert , oriented and patient cooperative  Airway & Oxygen Therapy: Patient Spontanous Breathing  Post-op Assessment: Report given to RN and Post -op Vital signs reviewed and stable  Post vital signs: Reviewed and stable  Last Vitals:  Vitals:   01/12/16 0914  BP: 123/68  Pulse: (!) 56  Resp: 20  Temp: 36.4 C    Last Pain: There were no vitals filed for this visit.       Complications: No apparent anesthesia complications

## 2016-01-13 ENCOUNTER — Ambulatory Visit
Admission: RE | Admit: 2016-01-13 | Discharge: 2016-01-13 | Disposition: A | Discharge status: Home / Self Care | Payer: Medicare Other | Source: Ambulatory Visit | Attending: Interventional Radiology | Admitting: Interventional Radiology

## 2016-01-13 ENCOUNTER — Telehealth: Payer: Self-pay | Admitting: Vascular Surgery

## 2016-01-13 ENCOUNTER — Encounter (HOSPITAL_COMMUNITY): Payer: Self-pay | Admitting: Vascular Surgery

## 2016-01-13 DIAGNOSIS — C641 Malignant neoplasm of right kidney, except renal pelvis: Secondary | ICD-10-CM

## 2016-01-13 DIAGNOSIS — C642 Malignant neoplasm of left kidney, except renal pelvis: Secondary | ICD-10-CM

## 2016-01-13 HISTORY — PX: IR GENERIC HISTORICAL: IMG1180011

## 2016-01-13 NOTE — Telephone Encounter (Signed)
-----   Message from Denman George, RN sent at 01/12/2016  4:04 PM EDT ----- Regarding: needs 4 week f/u with Dr. Scot Dock; per [facility] no duplex needed   ----- Message ----- From: Angelia Mould, MD Sent: 01/12/2016   1:24 PM To: [facility] Charge Pool Subject: charge                                         PROCEDURE:  1. Ultrasound-guided placement of right IJ tunneled dialysis catheter 2. New left brachiocephalic AV fistula 3. Ligation of left radiocephalic fistula  SURGEON: Judeth Cornfield. Scot Dock, MD, FACS  ASSIST: Leontine Locket, PA  He will need a follow up visit in 4 weeks to check on his fistula. He does not need a duplex. Thank you. CD

## 2016-01-13 NOTE — Telephone Encounter (Signed)
Spoke to pt for f/u appt here on 02/10/16  Family member confirmed the appt

## 2016-01-20 ENCOUNTER — Other Ambulatory Visit (HOSPITAL_COMMUNITY): Payer: Self-pay | Admitting: Interventional Radiology

## 2016-01-20 DIAGNOSIS — R1900 Intra-abdominal and pelvic swelling, mass and lump, unspecified site: Secondary | ICD-10-CM

## 2016-01-24 ENCOUNTER — Other Ambulatory Visit: Payer: Self-pay | Admitting: Radiology

## 2016-01-25 ENCOUNTER — Other Ambulatory Visit: Payer: Self-pay | Admitting: Radiology

## 2016-01-25 NOTE — Progress Notes (Signed)
Chief Complaint: Right renal fossa mass.  History of Present Illness: [name] is a 65 y.o. male well known to me after prior ablations of bilateral renal cell carcinomas.  He developed an area of enhancing peripheral nodularity along the margin of previous right renal ablation 7 years after treatment.  Due to the fact that he was on a renal transplant list at [facility], he underwent right nephrectomy in October, 2016.  This did not show evidence of malignancy involving the right kidney.  The surgery was complicated by post-operative abscess requiring multiple drainage procedures and he had a long recovery.  During post-operative course, CT has shown resolution of abscess, but an enlarging soft tissue nodule in the lateral aspect of the right retroperitoneum now measuring up to 3.7 cm by CT on 11/04/2015 and suspicious for residual enlarging tumor.  He was told he has recurrent RCC and is now off the renal transplant list.  He and his wife asked to be seen today for advice regarding how to manage this finding.   Past Medical History:  Diagnosis Date  . Anemia   . Arthritis    "back, neck" (07/28/2014)  . Blind    both eyes removed   . Bruises easily   . CAD, NATIVE VESSEL 01/08/2008      . Cellulitis late 1980's   "hospitalized; wrapped both legs; several times; no OR for this"  . Colon polyps   . Diabetic neuropathy ([place])   . DM type 2 (diabetes mellitus, type 2) ([place])    no medications (07/28/2014)  . ESRD (end stage renal disease) on dialysis [facility])    [name]; Mon, Wed, Fri (07/28/2014)  . Family history of breast cancer    mother  . HYPERLIPIDEMIA-MIXED 01/08/2008  . HYPERTENSION 01/27/2009   BP low  . Hypotension   . Kidney stones   . Low blood pressure   . OVERWEIGHT/OBESITY 01/08/2008   Lost 205 lbs through diet and exercise.    . Peripheral vascular disease ([place])    vein stripping  . Pneumonia 2000;s X 1  . Prostate cancer ([place])   . Prosthetic eye globe     both eyes  . Renal cell carcinoma 2001 and 2003   "both kidneys"  . Renal insufficiency     Past Surgical History:  Procedure Laterality Date  . AV FISTULA PLACEMENT Left 02/2010   LFA  . AV FISTULA PLACEMENT Left 01/12/2016   Procedure: LEFT BRACHIOCEPHALIC ARTERIOVENOUS (AV) FISTULA CREATION;  Surgeon: Angelia Mould, MD;  Location: [place];  Service: Vascular;  Laterality: Left;  . CHOLECYSTECTOMY OPEN  1992  . COLONOSCOPY  06/14/2011   Procedure: COLONOSCOPY;  Surgeon: [name], MD;  [facility];  Service: Endoscopy;  Laterality: N/A;  . COLONOSCOPY N/A 07/24/2012   Procedure: COLONOSCOPY;  Surgeon: [name], MD;  [facility];  Service: Endoscopy;  Laterality: N/A;  . COLONOSCOPY    . COLONOSCOPY N/A 11/04/2015   Procedure: COLONOSCOPY;  Surgeon: Manus Gunning, MD;  Location: [facility]COPY;  Service: Gastroenterology;  Laterality: N/A;  . COLONOSCOPY WITH PROPOFOL N/A 05/13/2014   Procedure: COLONOSCOPY WITH PROPOFOL;  Surgeon: [name], MD;  [facility];  Service: Endoscopy;  Laterality: N/A;  . CORONARY ARTERY BYPASS GRAFT  09/29/2011   Procedure: CORONARY ARTERY BYPASS GRAFTING (CABG);  Surgeon: [name], MD;  Location: [employer];  Service: Open Heart Surgery;  Laterality: N/A;  Coronary Artery Bypass Graft  times two utilizing the left internal mammary artery and the right greater saphenous vein harvested endoscopically.  . CYSTOSCOPY WITH RETROGRADE PYELOGRAM, URETEROSCOPY AND STENT PLACEMENT Left 07/28/2014   Procedure: CYSTOSCOPY WITH RETROGRADE PYELOGRAM, URETERAL BALLOON DILITATION, URETEROSCOPY AND LEFT STENT PLACEMENT;  Surgeon: [name], MD;  [facility];  Service: Urology;  Laterality: Left;  . ENUCLEATION  2003; 2006   bilateral; "diabetes; pain"  . FOOT AMPUTATION Right ~ 2002   right;  partial; "infection"  . FRACTURE SURGERY    . HIP PINNING,CANNULATED Left 12/31/2013   Procedure: CANNULATED HIP  PINNING;  Surgeon: Renette Butters, MD;  Location: [place];  Service: Orthopedics;  Laterality: Left;  Carm, FX Table, Stryker  . HOT HEMOSTASIS  06/14/2011   Procedure: HOT HEMOSTASIS (ARGON PLASMA COAGULATION/BICAP);  Surgeon: [name], MD;  [facility];  Service: Endoscopy;  Laterality: N/A;  . INSERTION OF DIALYSIS CATHETER Right 01/12/2016   Procedure: INSERTION OF DIALYSIS CATHETER;  Surgeon: Angelia Mould, MD;  Location: [facility];  Service: Vascular;  Laterality: Right;  . INSERTION PROSTATE RADIATION SEED  03/2012  . LEFT HEART CATHETERIZATION WITH CORONARY ANGIOGRAM N/A 09/27/2011   Procedure: LEFT HEART CATHETERIZATION WITH CORONARY ANGIOGRAM;  Surgeon: [name], MD;  Location: [facility] CATH LAB;  Service: Cardiovascular;  Laterality: N/A;  . LIGATION OF ARTERIOVENOUS  FISTULA Left 01/12/2016   Procedure: LIGATION OF LEFT RADIOCEPHALIC ARTERIOVENOUS  FISTULA;  Surgeon: Angelia Mould, MD;  Location: [place];  Service: Vascular;  Laterality: Left;  . LIGATION OF COMPETING BRANCHES OF ARTERIOVENOUS FISTULA Left 07/29/2014   Procedure: LIGATION OF COMPETING BRANCHES OF LEFT ARM ARTERIOVENOUS FISTULA;  Surgeon: [name], MD;  Location: [place];  Service: Vascular;  Laterality: Left;  . NEPHRECTOMY Right 01/30/2015   done at [place]  . RADIOFREQUENCY ABLATION KIDNEY  2010-2012   "twice; one on each side; for cancer"  . REVISON OF ARTERIOVENOUS FISTULA Left 09/01/2015   Procedure: EXPLORATION LEFT LOWER ARM  RADIOCEPHALIC ARTERIOVENOUS FISTULA;  Surgeon: Conrad Oak Hill, MD;  Location: [place];  Service: Vascular;  Laterality: Left;  [name] VARICOSE VEIN SURGERY  mid 1980's    BLE; "knees down; both legs; 2 separate times"    Allergies: Codeine and Tape  Medications: Prior to Admission medications   Medication Sig Start Date End Date Taking? Authorizing Provider  aspirin EC 81 MG tablet Take 81 mg by mouth at bedtime.    Historical Provider, MD  atorvastatin  (LIPITOR) 10 MG tablet TAKE 1 TABLET BY MOUTH EVERY DAY Patient taking differently: TAKE 1 TABLET BY MOUTH DAILY AT BEDTIME 05/13/14   Thayer Headings, MD  calcitRIOL (ROCALTROL) 0.25 MCG capsule Take 0.25 mcg by mouth 3 (three) times a week. Reported on 08/26/2015    Historical Provider, MD  cinacalcet (SENSIPAR) 30 MG tablet Take 30 mg by mouth daily with supper.    Historical Provider, MD  heparin 5000 UNIT/ML injection Inject 8,000 Units into the skin 3 (three) times a week. Monday, Wednesday, and Friday.    Historical Provider, MD  Methoxy PEG-Epoetin Beta (MIRCERA) 50 MCG/0.3ML SOSY Inject 225 mcg as directed every 14 (fourteen) days.     Historical Provider, MD  midodrine (PROAMATINE) 10 MG tablet Take 1 tablet (10 mg total) by mouth 3 (three) times daily with meals. 11/05/15   Reyne Dumas, MD  multivitamin (RENA-VIT) TABS tablet Take 1 tablet by mouth at bedtime.    Historical Provider, MD  oxyCODONE-acetaminophen (PERCOCET) 5-325  MG tablet Take 1 tablet by mouth every 6 (six) hours as needed for severe pain. 01/12/16   Samantha J Rhyne, PA-C  Probiotic Product (ALIGN) 4 MG CAPS Take 1 capsule by mouth daily.    Historical Provider, MD  sevelamer carbonate (RENVELA) 800 MG tablet Take 2,400-4,000 mg by mouth See admin instructions. Take 6 tablets (4000 mg) by mouth 3 times daily with meals and 3 tablets (2400 mg) 2 times daily with snacks 06/26/14   Historical Provider, MD     Family History  Problem Relation Age of Onset  . Cancer Mother 71    breast, spine and ovarian  . Heart disease Mother   . Hyperlipidemia Mother   . Hypertension Mother   . Varicose Veins Mother   . Emphysema Father   . Cancer Father 31    kidney, prostate  . Heart disease Father   . Hyperlipidemia Father   . Hypertension Father   . Cancer Sister     ovarian  . Cancer Brother     lung  . Heart disease Brother   . Hyperlipidemia Brother   . Hypertension Brother   . Heart attack Brother   . Peripheral  vascular disease Brother   . Cancer    . Coronary artery disease    . Kidney disease    . Breast cancer    . Ovarian cancer    . Lung cancer    . Diabetes    . Malignant hyperthermia Neg Hx     Social History   Social History  . Marital status: Married    Spouse name: N/A  . Number of children: N/A  . Years of education: N/A   Social History Main Topics  . Smoking status: Never Smoker  . Smokeless tobacco: Never Used  . Alcohol use No  . Drug use: No  . Sexual activity: No   Other Topics Concern  . Not on file   Social History Narrative  . No narrative on file    ECOG Status: 1 - Symptomatic but completely ambulatory  Review of Systems: A 12 point ROS discussed and pertinent positives are indicated in the HPI above.  All other systems are negative.  Review of Systems  Vital Signs: There were no vitals taken for this visit.  Physical Exam   Imaging: Dg Chest Port 1 View  Result Date: 01/12/2016 CLINICAL DATA:  Post dialysis catheter insertion EXAM: PORTABLE CHEST 1 VIEW COMPARISON:  11/01/2015 FINDINGS: Cardiomediastinal silhouette is stable. Status post CABG. There is dual lumen right IJ catheter with tip in SVC right atrium junction. No pneumothorax. No infiltrate or pulmonary edema. IMPRESSION: Right IJ double lumen dialysis catheter in place.  No pneumothorax. Electronically Signed   By: Lahoma Crocker M.D.   On: 01/12/2016 13:55   Dg Fluoro Guide Cv Line-no Report  Result Date: 01/12/2016 CLINICAL DATA:  FLOURO GUIDE CV LINE Fluoroscopy was utilized by the requesting physician.  No radiographic interpretation.    Labs:  CBC:  Recent Labs  05/12/15 0632  11/02/15 0436  11/04/15 0705 11/04/15 1153 11/05/15 0638 01/12/16 0919  WBC 6.6  --  5.0  --  2.5*  --  2.1*  --   HGB 8.1*  < > 8.2*  --  8.7* 8.2* 8.7* 9.9*  HCT 26.3*  < > 25.4*  < > 25.9* 24.0* 25.8* 29.0*  PLT 121*  --  59*  --  60*  --  62*  --   < > =  values in this interval not  displayed.  COAGS:  Recent Labs  11/01/15 2200  INR 1.12  APTT 31    BMP:  Recent Labs  11/01/15 1812 11/02/15 0436 11/03/15 0553 11/04/15 0705 11/04/15 1153 01/12/16 0919  NA 144 145 140 142 140 138  K 3.6 3.7 4.1 4.6 3.1* 4.3  CL 103 106 100* 102  --   --   CO2 27 28 30 25   --   --   GLUCOSE 131* 132* 82 86 72 120*  BUN 86* 90* 39* 48*  --   --   CALCIUM 9.6 9.3 9.5 9.3  --   --   CREATININE 6.99* 7.68* 4.78* 6.11*  --   --   GFRNONAA 7* 6* 12* 9*  --   --   GFRAA 8* 8* 13* 10*  --   --     LIVER FUNCTION TESTS:  Recent Labs  05/11/15 1620 05/12/15 0632 11/03/15 0553 11/04/15 0705  BILITOT 0.6 0.8 1.1 1.2  AST 13* 11* 39 38  ALT 10* 12* 27 31  ALKPHOS 92 91 92 102  PROT 6.1* 5.8* 5.1* 5.3*  ALBUMIN 2.7* 2.3* 2.8* 2.9*    Assessment and Plan:  I met with [name] and his wife strictly in a consultative role.  I reviewed all of his imaging both at [facility] since his nephrectomy.  There certainly is an enlarging, solid and enhancing mass present in the right retroperitoneum immediately inferior to the liver and posterior to the colon.  This is suspicious for neoplasm.  Based on location of the area of previous nodularity along the outer/lateral margin of an exophytic right upper pole renal ablation defect that abutted the inferior liver, I suspect that the current abnormality represents enlargement of that nodule.  The nephrectomy was documented to have been very difficult due to adhesions and scar tissue, and the nodule may have been left behind due to difficulty of the operation.  My recommendation is to undergo CT guided biopsy to prove that this represents malignancy.  If it is recurrent renal carcinoma, I think ablation of the tumor may be difficult and high risk given that it is likely adherent to the liver capsule and possibly also the ascending colon.  If this is malignant, I suggested getting opinions from Dr. Alen Blew and also possibly Dr.  Alinda Money or [facility] here.  The patient does not want to return to [facility].  We will schedule an outpatient CT guided biopsy on a non-dialysis day.   Electronically SignedAletta Edouard T 01/25/2016, 9:23 AM     I spent a total of  25 Minutes in face to face in clinical consultation, greater than 50% of which was counseling/coordinating care for right retroperitoneal mass.

## 2016-01-26 ENCOUNTER — Ambulatory Visit (HOSPITAL_COMMUNITY): Admission: RE | Admit: 2016-01-26 | Payer: Medicare Other | Source: Ambulatory Visit

## 2016-02-01 ENCOUNTER — Other Ambulatory Visit: Payer: Self-pay | Admitting: Radiology

## 2016-02-02 ENCOUNTER — Encounter (HOSPITAL_COMMUNITY): Payer: Self-pay

## 2016-02-02 ENCOUNTER — Ambulatory Visit (HOSPITAL_COMMUNITY)
Admission: RE | Admit: 2016-02-02 | Discharge: 2016-02-02 | Disposition: A | Discharge status: Home / Self Care | Payer: Medicare Other | Source: Ambulatory Visit | Attending: Interventional Radiology | Admitting: Interventional Radiology

## 2016-02-02 DIAGNOSIS — R1900 Intra-abdominal and pelvic swelling, mass and lump, unspecified site: Secondary | ICD-10-CM | POA: Diagnosis not present

## 2016-02-02 LAB — CBC WITH DIFFERENTIAL/PLATELET
BASOS ABS: 0 10*3/uL (ref 0.0–0.1)
Basophils Relative: 0 %
EOS ABS: 0.2 10*3/uL (ref 0.0–0.7)
EOS PCT: 4 %
HCT: 30.1 % — ABNORMAL LOW (ref 39.0–52.0)
Hemoglobin: 9.9 g/dL — ABNORMAL LOW (ref 13.0–17.0)
Lymphocytes Relative: 15 %
Lymphs Abs: 0.7 10*3/uL (ref 0.7–4.0)
MCH: 33.1 pg (ref 26.0–34.0)
MCHC: 32.9 g/dL (ref 30.0–36.0)
MCV: 100.7 fL — ABNORMAL HIGH (ref 78.0–100.0)
Monocytes Absolute: 0.3 10*3/uL (ref 0.1–1.0)
Monocytes Relative: 7 %
Neutro Abs: 3.3 10*3/uL (ref 1.7–7.7)
Neutrophils Relative %: 74 %
PLATELETS: 105 10*3/uL — AB (ref 150–400)
RBC: 2.99 MIL/uL — AB (ref 4.22–5.81)
RDW: 14.1 % (ref 11.5–15.5)
WBC: 4.5 10*3/uL (ref 4.0–10.5)

## 2016-02-02 LAB — BASIC METABOLIC PANEL
Anion gap: 11 (ref 5–15)
BUN: 55 mg/dL — AB (ref 6–20)
CO2: 31 mmol/L (ref 22–32)
CREATININE: 4.64 mg/dL — AB (ref 0.61–1.24)
Calcium: 8.7 mg/dL — ABNORMAL LOW (ref 8.9–10.3)
Chloride: 96 mmol/L — ABNORMAL LOW (ref 101–111)
GFR calc Af Amer: 14 mL/min — ABNORMAL LOW (ref >60)
GFR, EST NON AFRICAN AMERICAN: 12 mL/min — AB (ref >60)
Glucose, Bld: 109 mg/dL — ABNORMAL HIGH (ref 65–99)
POTASSIUM: 4.3 mmol/L (ref 3.5–5.1)
SODIUM: 138 mmol/L (ref 135–145)

## 2016-02-02 LAB — PROTIME-INR
INR: 0.89
PROTHROMBIN TIME: 12 s (ref 11.4–15.2)

## 2016-02-02 LAB — GLUCOSE, CAPILLARY: GLUCOSE-CAPILLARY: 109 mg/dL — AB (ref 65–99)

## 2016-02-02 MED ORDER — MIDAZOLAM HCL 2 MG/2ML IJ SOLN
INTRAMUSCULAR | Status: AC | PRN
  Administered 2016-02-02: 0.5 mg via INTRAVENOUS
  Administered 2016-02-02: 1 mg via INTRAVENOUS

## 2016-02-02 MED ORDER — FENTANYL CITRATE (PF) 100 MCG/2ML IJ SOLN
INTRAMUSCULAR | Status: AC | PRN
  Administered 2016-02-02: 50 ug via INTRAVENOUS
  Administered 2016-02-02: 25 ug via INTRAVENOUS

## 2016-02-02 MED ORDER — SODIUM CHLORIDE 0.9 % IV SOLN
INTRAVENOUS | Status: AC | PRN
  Administered 2016-02-02: 10 mL/h via INTRAVENOUS

## 2016-02-02 MED ORDER — SODIUM CHLORIDE 0.9 % IV SOLN: INTRAVENOUS | Status: DC

## 2016-02-02 MED ORDER — MIDAZOLAM HCL 2 MG/2ML IJ SOLN
INTRAMUSCULAR | Status: AC
  Filled 2016-02-02: qty 4

## 2016-02-02 MED ORDER — LIDOCAINE HCL 1 % IJ SOLN
INTRAMUSCULAR | Status: AC
  Filled 2016-02-02: qty 20

## 2016-02-02 MED ORDER — FENTANYL CITRATE (PF) 100 MCG/2ML IJ SOLN
INTRAMUSCULAR | Status: AC
  Filled 2016-02-02: qty 4

## 2016-02-02 NOTE — H&P (Signed)
Chief Complaint: right retroperitoneal mass  Referring Physician: none, patient referred  Supervising Physician: Aletta Edouard  Patient Status: [facility] - Out-pt  HPI: [name] is an 65 y.o. male who has a history of a ablations of bilateral renal cell carcinomas.  He had a right renal ablation 7 years ago.  He was put on [facility] transplant list, but had to undergo a right nephrectomy in October of 2016.  On his followup post operative imaging he was noted to have an enlarging soft tissue nodule in the lateral aspect of the right RTP that was suspicious for residual enlarging tumor.  He and his wife presented to [name] as they know him from his ablations and have requested advice.  He has suggested a biopsy.  The patient presents today for this procedure.  Past Medical History:  Past Medical History:  Diagnosis Date  . Anemia   . Arthritis    "back, neck" (07/28/2014)  . Blind    both eyes removed   . Bruises easily   . CAD, NATIVE VESSEL 01/08/2008      . Cellulitis late 1980's   "hospitalized; wrapped both legs; several times; no OR for this"  . Colon polyps   . Diabetic neuropathy ([place])   . DM type 2 (diabetes mellitus, type 2) ([place])    no medications (07/28/2014)  . ESRD (end stage renal disease) on dialysis [facility])    [name]; Mon, Wed, Fri (07/28/2014)  . Family history of breast cancer    mother  . HYPERLIPIDEMIA-MIXED 01/08/2008  . HYPERTENSION 01/27/2009   BP low  . Hypotension   . Kidney stones   . Low blood pressure   . OVERWEIGHT/OBESITY 01/08/2008   Lost 205 lbs through diet and exercise.    . Peripheral vascular disease ([place])    vein stripping  . Pneumonia 2000;s X 1  . Prostate cancer ([place])   . Prosthetic eye globe    both eyes  . Renal cell carcinoma 2001 and 2003   "both kidneys"  . Renal insufficiency     Past Surgical History:  Past Surgical History:  Procedure Laterality Date  . AV FISTULA PLACEMENT Left 02/2010   LFA  . AV  FISTULA PLACEMENT Left 01/12/2016   Procedure: LEFT BRACHIOCEPHALIC ARTERIOVENOUS (AV) FISTULA CREATION;  Surgeon: Angelia Mould, MD;  Location: [place];  Service: Vascular;  Laterality: Left;  . CHOLECYSTECTOMY OPEN  1992  . COLONOSCOPY  06/14/2011   Procedure: COLONOSCOPY;  Surgeon: [name], MD;  [facility];  Service: Endoscopy;  Laterality: N/A;  . COLONOSCOPY N/A 07/24/2012   Procedure: COLONOSCOPY;  Surgeon: [name], MD;  [facility];  Service: Endoscopy;  Laterality: N/A;  . COLONOSCOPY    . COLONOSCOPY N/A 11/04/2015   Procedure: COLONOSCOPY;  Surgeon: Manus Gunning, MD;  Location: [facility] ENDOSCOPY;  Service: Gastroenterology;  Laterality: N/A;  . COLONOSCOPY WITH PROPOFOL N/A 05/13/2014   Procedure: COLONOSCOPY WITH PROPOFOL;  Surgeon: [name], MD;  [facility];  Service: Endoscopy;  Laterality: N/A;  . CORONARY ARTERY BYPASS GRAFT  09/29/2011   Procedure: CORONARY ARTERY BYPASS GRAFTING (CABG);  Surgeon: [name], MD;  Location: [place];  Service: Open Heart Surgery;  Laterality: N/A;  Coronary Artery Bypass Graft times two utilizing the left internal mammary artery and the right greater saphenous vein harvested endoscopically.  . CYSTOSCOPY WITH RETROGRADE PYELOGRAM, URETEROSCOPY AND STENT PLACEMENT Left 07/28/2014   Procedure: CYSTOSCOPY  WITH RETROGRADE PYELOGRAM, URETERAL BALLOON DILITATION, URETEROSCOPY AND LEFT STENT PLACEMENT;  Surgeon: [name], MD;  [facility];  Service: Urology;  Laterality: Left;  . ENUCLEATION  2003; 2006   bilateral; "diabetes; pain"  . FOOT AMPUTATION Right ~ 2002   right;  partial; "infection"  . FRACTURE SURGERY    . HIP PINNING,CANNULATED Left 12/31/2013   Procedure: CANNULATED HIP PINNING;  Surgeon: Renette Butters, MD;  Location: [place];  Service: Orthopedics;  Laterality: Left;  Carm, FX Table, Stryker  . HOT HEMOSTASIS  06/14/2011   Procedure: HOT HEMOSTASIS (ARGON PLASMA  COAGULATION/BICAP);  Surgeon: [name], MD;  [facility];  Service: Endoscopy;  Laterality: N/A;  . INSERTION OF DIALYSIS CATHETER Right 01/12/2016   Procedure: INSERTION OF DIALYSIS CATHETER;  Surgeon: Angelia Mould, MD;  Location: [place];  Service: Vascular;  Laterality: Right;  . INSERTION PROSTATE RADIATION SEED  03/2012  . LEFT HEART CATHETERIZATION WITH CORONARY ANGIOGRAM N/A 09/27/2011   Procedure: LEFT HEART CATHETERIZATION WITH CORONARY ANGIOGRAM;  Surgeon: [name], MD;  Location: [facility] CATH LAB;  Service: Cardiovascular;  Laterality: N/A;  . LIGATION OF ARTERIOVENOUS  FISTULA Left 01/12/2016   Procedure: LIGATION OF LEFT RADIOCEPHALIC ARTERIOVENOUS  FISTULA;  Surgeon: Angelia Mould, MD;  Location: [place];  Service: Vascular;  Laterality: Left;  . LIGATION OF COMPETING BRANCHES OF ARTERIOVENOUS FISTULA Left 07/29/2014   Procedure: LIGATION OF COMPETING BRANCHES OF LEFT ARM ARTERIOVENOUS FISTULA;  Surgeon: [name], MD;  Location: [place];  Service: Vascular;  Laterality: Left;  . NEPHRECTOMY Right 01/30/2015   done at [place]  . RADIOFREQUENCY ABLATION KIDNEY  2010-2012   "twice; one on each side; for cancer"  . REVISON OF ARTERIOVENOUS FISTULA Left 09/01/2015   Procedure: EXPLORATION LEFT LOWER ARM  RADIOCEPHALIC ARTERIOVENOUS FISTULA;  Surgeon: Conrad Kings Grant, MD;  Location: [place];  Service: Vascular;  Laterality: Left;  [name] VARICOSE VEIN SURGERY  mid 1980's    BLE; "knees down; both legs; 2 separate times"    Family History:  Family History  Problem Relation Age of Onset  . Cancer Mother 60    breast, spine and ovarian  . Heart disease Mother   . Hyperlipidemia Mother   . Hypertension Mother   . Varicose Veins Mother   . Emphysema Father   . Cancer Father 37    kidney, prostate  . Heart disease Father   . Hyperlipidemia Father   . Hypertension Father   . Cancer Sister     ovarian  . Cancer Brother     lung  . Heart disease  Brother   . Hyperlipidemia Brother   . Hypertension Brother   . Heart attack Brother   . Peripheral vascular disease Brother   . Cancer    . Coronary artery disease    . Kidney disease    . Breast cancer    . Ovarian cancer    . Lung cancer    . Diabetes    . Malignant hyperthermia Neg Hx     Social History:  reports that he has never smoked. He has never used smokeless tobacco. He reports that he does not drink alcohol or use drugs.  Allergies:  Allergies  Allergen Reactions  . Codeine Other (See Comments)    Makes patient incoherent. STATES MAKES HIM COMATOSE  . Tape Other (See Comments)    Plastic tape tears skin off, please use paper tape instead.    Medications:  Medications reviewed in Epic  Please HPI for pertinent positives, otherwise complete 10 system ROS negative.  Mallampati Score: MD Evaluation Airway: WNL Heart: WNL Abdomen: WNL Chest/ Lungs: WNL ASA  Classification: 3 Mallampati/Airway Score: Two  Physical Exam: BP 133/70   Pulse (!) 58   Temp 97.6 F (36.4 C) (Oral)   Resp 16   Ht 6' 4"  (1.93 m)   Wt 198 lb (89.8 kg)   SpO2 100%   BMI 24.10 kg/m  Body mass index is 24.1 kg/m. General: pleasant, WD, WN white male who is laying in bed in NAD HEENT: head is normocephalic, atraumatic.  Blind, keeps eyes closed  Ears and nose without any masses or lesions.  Mouth is pink and moist Heart: regular, rate, and rhythm.  Normal s1,s2. No obvious murmurs, gallops, or rubs noted.  Palpable radial and pedal pulses bilaterally Lungs: CTAB, no wheezes, rhonchi, or rales noted.  Respiratory effort nonlabored Abd: soft, NT, ND, +BS, no masses, hernias, or organomegaly Psych: A&Ox3 with an appropriate affect.   Labs: Results for orders placed or performed during the hospital encounter of 02/02/16 (from the past 48 hour(s))  Basic metabolic panel     Status: Abnormal   Collection Time: 02/02/16  6:16 AM  Result Value Ref Range   Sodium 138 135 - 145 mmol/L     Potassium 4.3 3.5 - 5.1 mmol/L   Chloride 96 (L) 101 - 111 mmol/L   CO2 31 22 - 32 mmol/L   Glucose, Bld 109 (H) 65 - 99 mg/dL   BUN 55 (H) 6 - 20 mg/dL   Creatinine, Ser 4.64 (H) 0.61 - 1.24 mg/dL   Calcium 8.7 (L) 8.9 - 10.3 mg/dL   GFR calc non Af Amer 12 (L) >60 mL/min   GFR calc Af Amer 14 (L) >60 mL/min    Comment: (NOTE) The eGFR has been calculated using the CKD EPI equation. This calculation has not been validated in all clinical situations. eGFR's persistently <60 mL/min signify possible Chronic Kidney Disease.    Anion gap 11 5 - 15  CBC with Differential/Platelet     Status: Abnormal   Collection Time: 02/02/16  6:16 AM  Result Value Ref Range   WBC 4.5 4.0 - 10.5 K/uL   RBC 2.99 (L) 4.22 - 5.81 MIL/uL   Hemoglobin 9.9 (L) 13.0 - 17.0 g/dL   HCT 30.1 (L) 39.0 - 52.0 %   MCV 100.7 (H) 78.0 - 100.0 fL   MCH 33.1 26.0 - 34.0 pg   MCHC 32.9 30.0 - 36.0 g/dL   RDW 14.1 11.5 - 15.5 %   Platelets 105 (L) 150 - 400 K/uL    Comment: SPECIMEN CHECKED FOR CLOTS REPEATED TO VERIFY PLATELET COUNT CONFIRMED BY SMEAR    Neutrophils Relative % 74 %   Neutro Abs 3.3 1.7 - 7.7 K/uL   Lymphocytes Relative 15 %   Lymphs Abs 0.7 0.7 - 4.0 K/uL   Monocytes Relative 7 %   Monocytes Absolute 0.3 0.1 - 1.0 K/uL   Eosinophils Relative 4 %   Eosinophils Absolute 0.2 0.0 - 0.7 K/uL   Basophils Relative 0 %   Basophils Absolute 0.0 0.0 - 0.1 K/uL  Protime-INR     Status: None   Collection Time: 02/02/16  6:16 AM  Result Value Ref Range   Prothrombin Time 12.0 11.4 - 15.2 seconds   INR 0.89   Glucose, capillary     Status: Abnormal   Collection Time: 02/02/16  6:32 AM  Result Value Ref Range   Glucose-Capillary 109 (H) 65 - 99 mg/dL    Imaging: No results found.  Assessment/Plan 1. Right RTP mass -we will plan to proceed with a biopsy of this lesion today to determine if this is a recurrent RCC. -labs and vitals have been reviewed -Risks and Benefits discussed with the  patient including, but not limited to bleeding, infection, damage to adjacent structures or low yield requiring additional tests. All of the patient's questions were answered, patient is agreeable to proceed. Consent signed and in chart.  Thank you for this interesting consult.  I greatly enjoyed meeting TIMOTHY TRUDELL and look forward to participating in their care.  A copy of this report was sent to the requesting provider on this date.  Electronically Signed: Henreitta Cea 02/02/2016, 8:12 AM   I spent a total of    25 Minutes in face to face in clinical consultation, greater than 50% of which was counseling/coordinating care for right RTP mass

## 2016-02-02 NOTE — Discharge Instructions (Signed)
Needle Biopsy, Care After °These instructions give you information about caring for yourself after your procedure. Your doctor may also give you more specific instructions. Call your doctor if you have any problems or questions after your procedure. °HOME CARE °· Rest as told by your doctor. °· Take medicines only as told by your doctor. °· There are many different ways to close and cover the biopsy site, including stitches (sutures), skin glue, and adhesive strips. Follow instructions from your doctor about: °¨ How to take care of your biopsy site. °¨ When and how you should change your bandage (dressing). °¨ When you should remove your dressing. °¨ Removing whatever was used to close your biopsy site. °· Check your biopsy site every day for signs of infection. Watch for: °¨ Redness, swelling, or pain. °¨ Fluid, blood, or pus. °GET HELP IF: °· You have a fever. °· You have redness, swelling, or pain at the biopsy site, and it lasts longer than a few days. °· You have fluid, blood, or pus coming from the biopsy site. °· You feel sick to your stomach (nauseous). °· You throw up (vomit). °GET HELP RIGHT AWAY IF: °· You are short of breath. °· You have trouble breathing. °· Your chest hurts. °· You feel dizzy or you pass out (faint). °· You have bleeding that does not stop with pressure or a bandage. °· You cough up blood. °· Your belly (abdomen) hurts. °  °This information is not intended to replace advice given to you by your health care provider. Make sure you discuss any questions you have with your health care provider. °  °Document Released: 03/03/2008 Document Revised: 08/05/2014 Document Reviewed: 03/17/2014 °Elsevier Interactive Patient Education ©2016 Elsevier Inc. ° °

## 2016-02-02 NOTE — Procedures (Signed)
Interventional Radiology Procedure Note  Procedure:  CT guided biopsy of retroperitoneal mass  Complications: None  Estimated Blood Loss: < 10 mL  4.5-5 cm right RP mass localized and sampled with 18 G core device x 3 via 17 G needle.    [name]. Kathlene Cote, M.D Pager:  [phone]

## 2016-02-09 ENCOUNTER — Encounter: Payer: Self-pay | Admitting: Vascular Surgery

## 2016-02-10 ENCOUNTER — Other Ambulatory Visit: Payer: Self-pay

## 2016-02-10 ENCOUNTER — Ambulatory Visit (HOSPITAL_COMMUNITY)
Admission: RE | Admit: 2016-02-10 | Discharge: 2016-02-10 | Disposition: A | Discharge status: Home / Self Care | Payer: Medicare Other | Source: Ambulatory Visit | Attending: Vascular Surgery | Admitting: Vascular Surgery

## 2016-02-10 ENCOUNTER — Encounter: Payer: Self-pay | Admitting: Vascular Surgery

## 2016-02-10 ENCOUNTER — Encounter: Payer: Self-pay | Admitting: Oncology

## 2016-02-10 ENCOUNTER — Ambulatory Visit (INDEPENDENT_AMBULATORY_CARE_PROVIDER_SITE_OTHER): Payer: Medicare Other | Admitting: Vascular Surgery

## 2016-02-10 VITALS — BP 98/56 | HR 65 | Temp 97.5°F | Resp 16 | Ht 76.0 in | Wt 198.0 lb

## 2016-02-10 DIAGNOSIS — Z48812 Encounter for surgical aftercare following surgery on the circulatory system: Secondary | ICD-10-CM

## 2016-02-10 DIAGNOSIS — N186 End stage renal disease: Secondary | ICD-10-CM

## 2016-02-10 DIAGNOSIS — Z992 Dependence on renal dialysis: Secondary | ICD-10-CM

## 2016-02-10 NOTE — Progress Notes (Signed)
Patient name: [name] MRN: [id number] DOB: Jul 04, 1950 Sex: male  REASON FOR VISIT: Follow up after left brachiocephalic AV fistula.  HPI: [name] is a 65 y.o. male who had a left radiocephalic fistula which was poorly functioning. There were no options for revision. On 01/12/2016 he underwent placement of a right IJ tunneled dialysis catheter and placement of a new left brachiocephalic fistula with ligation of his left radiocephalic fistula. He comes in for his first outpatient visit.  He has no specific complaints. He denies pain or paresthesias in his left arm.  His wife does tell me that he was recently diagnosed with a recurrent cancer. This is above his kidney. She does not know the details.  Current Outpatient Prescriptions  Medication Sig Dispense Refill  . aspirin EC 81 MG tablet Take 81 mg by mouth at bedtime.    [name] atorvastatin (LIPITOR) 10 MG tablet TAKE 1 TABLET BY MOUTH EVERY DAY (Patient taking differently: TAKE 1 TABLET BY MOUTH DAILY AT BEDTIME) 30 tablet 6  . calcitRIOL (ROCALTROL) 0.25 MCG capsule Take 0.25 mcg by mouth 3 (three) times a week. Reported on 08/26/2015    . cinacalcet (SENSIPAR) 90 MG tablet Take 90 mg by mouth daily.    . clotrimazole (LOTRIMIN) 1 % external solution     . heparin 5000 UNIT/ML injection Inject 8,000 Units into the skin 3 (three) times a week. Monday, Wednesday, and Friday.    . Methoxy PEG-Epoetin Beta (MIRCERA) 50 MCG/0.3ML SOSY Inject 225 mcg as directed every 14 (fourteen) days.     . midodrine (PROAMATINE) 10 MG tablet Take 1 tablet (10 mg total) by mouth 3 (three) times daily with meals. 90 tablet 0  . multivitamin (RENA-VIT) TABS tablet Take 1 tablet by mouth at bedtime.    . Probiotic Product (ALIGN) 4 MG CAPS Take 1 capsule by mouth daily.    . SENSIPAR 30 MG tablet     . sevelamer carbonate (RENVELA) 800 MG tablet Take 2,400-4,800 mg by mouth See admin instructions. Take 6 tablets (4800 mg) by mouth 3 times daily with  meals and 3 tablets (2400 mg) 2 times daily with snacks    . oxyCODONE-acetaminophen (PERCOCET) 5-325 MG tablet Take 1 tablet by mouth every 6 (six) hours as needed for severe pain. (Patient not taking: Reported on 02/10/2016) 8 tablet 0  . polyethylene glycol (MIRALAX / GLYCOLAX) packet      No current facility-administered medications for this visit.    Facility-Administered Medications Ordered in Other Visits  Medication Dose Route Frequency Provider Last Rate Last Dose  . Chlorhexidine Gluconate Cloth 2 % PADS 6 each  6 each Topical Once [name], MD        REVIEW OF SYSTEMS:  [X]  denotes positive finding, [ ]  denotes negative finding Cardiac  Comments:  Chest pain or chest pressure:    Shortness of breath upon exertion:    Short of breath when lying flat:    Irregular heart rhythm:    Constitutional    Fever or chills:      PHYSICAL EXAM: Vitals:   02/10/16 1224  BP: (!) 98/56  Pulse: 65  Resp: 16  Temp: 97.5 F (36.4 C)  SpO2: 99%  Weight: 198 lb (89.8 kg)  Height: 6\' 4"  (1.93 m)    GENERAL: The patient is a well-nourished male, in no acute distress. The vital signs are documented above. CARDIOVASCULAR: There is a regular rate and rhythm. PULMONARY: There is good  air exchange bilaterally without wheezing or rales. His left upper arm fistula has an excellent thrill and appears to be maturing adequately. His incisions are healing nicely.  DUPLEX OF LEFT BRACHIOCEPHALIC FISTULA: I have independently interpreted his duplex of the left brachiocephalic fistula. The diameters range from 0.53-0.85 cm. Depths up here very reasonable.  MEDICAL ISSUES:  STATUS POST LEFT BRACHIOCEPHALIC AV FISTULA: His left brachiocephalic fistula appears to be maturing adequately. I think it should be ready for use the first of the year. This was done on 01/12/2016 but he had a little bit of a Headstart since he had a functioning forearm fistula for many years. I will see him back as  needed.  [name] [facility] [phone]

## 2016-02-12 ENCOUNTER — Encounter: Payer: Self-pay | Admitting: Genetic Counselor

## 2016-02-12 ENCOUNTER — Other Ambulatory Visit: Payer: Self-pay | Admitting: Interventional Radiology

## 2016-02-12 DIAGNOSIS — C641 Malignant neoplasm of right kidney, except renal pelvis: Secondary | ICD-10-CM

## 2016-02-12 DIAGNOSIS — C642 Malignant neoplasm of left kidney, except renal pelvis: Secondary | ICD-10-CM

## 2016-02-15 ENCOUNTER — Ambulatory Visit (HOSPITAL_COMMUNITY): Payer: Medicare Other

## 2016-02-15 ENCOUNTER — Ambulatory Visit (HOSPITAL_COMMUNITY)
Admission: RE | Admit: 2016-02-15 | Discharge: 2016-02-15 | Disposition: A | Discharge status: Home / Self Care | Payer: Medicare Other | Source: Ambulatory Visit | Attending: Interventional Radiology | Admitting: Interventional Radiology

## 2016-02-15 DIAGNOSIS — R1900 Intra-abdominal and pelvic swelling, mass and lump, unspecified site: Secondary | ICD-10-CM | POA: Insufficient documentation

## 2016-02-15 DIAGNOSIS — I700 Atherosclerosis of aorta: Secondary | ICD-10-CM | POA: Insufficient documentation

## 2016-02-15 DIAGNOSIS — C480 Malignant neoplasm of retroperitoneum: Secondary | ICD-10-CM | POA: Insufficient documentation

## 2016-02-15 DIAGNOSIS — J439 Emphysema, unspecified: Secondary | ICD-10-CM | POA: Diagnosis not present

## 2016-02-15 DIAGNOSIS — C642 Malignant neoplasm of left kidney, except renal pelvis: Secondary | ICD-10-CM | POA: Insufficient documentation

## 2016-02-15 DIAGNOSIS — C641 Malignant neoplasm of right kidney, except renal pelvis: Secondary | ICD-10-CM | POA: Diagnosis present

## 2016-02-15 MED ORDER — IOPAMIDOL (ISOVUE-300) INJECTION 61%
100.0000 mL | Freq: Once | INTRAVENOUS | Status: AC | PRN
  Administered 2016-02-15: 75 mL via INTRAVENOUS

## 2016-02-16 ENCOUNTER — Ambulatory Visit (HOSPITAL_BASED_OUTPATIENT_CLINIC_OR_DEPARTMENT_OTHER): Payer: Medicare Other | Admitting: Oncology

## 2016-02-16 VITALS — BP 95/48 | HR 75 | Temp 98.4°F | Resp 18 | Ht 76.0 in

## 2016-02-16 DIAGNOSIS — E119 Type 2 diabetes mellitus without complications: Secondary | ICD-10-CM

## 2016-02-16 DIAGNOSIS — N186 End stage renal disease: Secondary | ICD-10-CM

## 2016-02-16 DIAGNOSIS — D649 Anemia, unspecified: Secondary | ICD-10-CM

## 2016-02-16 DIAGNOSIS — D696 Thrombocytopenia, unspecified: Secondary | ICD-10-CM | POA: Diagnosis not present

## 2016-02-16 DIAGNOSIS — Z8051 Family history of malignant neoplasm of kidney: Secondary | ICD-10-CM

## 2016-02-16 DIAGNOSIS — C649 Malignant neoplasm of unspecified kidney, except renal pelvis: Secondary | ICD-10-CM | POA: Diagnosis not present

## 2016-02-16 DIAGNOSIS — Z803 Family history of malignant neoplasm of breast: Secondary | ICD-10-CM

## 2016-02-16 DIAGNOSIS — Z801 Family history of malignant neoplasm of trachea, bronchus and lung: Secondary | ICD-10-CM

## 2016-02-16 DIAGNOSIS — Z8041 Family history of malignant neoplasm of ovary: Secondary | ICD-10-CM

## 2016-02-16 DIAGNOSIS — Z85528 Personal history of other malignant neoplasm of kidney: Secondary | ICD-10-CM

## 2016-02-16 DIAGNOSIS — Z8042 Family history of malignant neoplasm of prostate: Secondary | ICD-10-CM

## 2016-02-16 NOTE — Progress Notes (Signed)
Reason for Referral: Renal cell carcinoma.   HPI: 65 year old gentleman currently of [place] where he lived the majority of his life. He is a gentleman with history of diabetes and end stage renal disease. He does have history of the renal cell carcinoma status post ablation in 2010 by [name]. He was under evaluation for possible kidney transplant and underwent a nephrectomy at [facility] for his native kidney. I was 11 2016 and resulted in a rather complicated intraoperative and postoperative recovery. He did develop a recurrence on the right kidney and has attempted nephrectomy was laparoscopic and converted into open nephrectomy done by the renal transplant service. Complications from that procedure resulted in abscess in the renal fossa that required antibiotics and percutaneous drainage. He was hospitalized again in August 2017 with sepsis and imaging studies did show a renal mass on the right side. Follow-up imaging studies in October 2017 showed that his right renal mass measuring 4.7 cm and does not appear to be an abscess. This was biopsied on 02/02/2016 by [name] and the pathology confirmed the presence of renal cell carcinoma. CT scan of the chest did not show any evidence of metastatic disease.  His case was discussed in the GU tumor Board on 02/12/2016 for management purposes. He was evaluated by Dr. Alinda Money today for possible primary surgical therapy. Clinically, he is asymptomatic at this point. He denied any fevers or chills or sweats. He is hemodialysis-dependent and his performance status is reasonably limited because of that. He is legally blind but has a reasonable quality of life.  He does not report any headaches, blurry vision, syncope or seizures. He does not report any fevers, chills or sweats. He does not report any chest pain, palpitation, orthopnea or leg edema. He does not report any cough, wheezing or hemoptysis. He does not report any nausea, vomiting or  abdominal pain. He does not report any frequency urgency or hesitancy. He is not report any skeletal complaints. Remaining review of systems unremarkable.   Past Medical History:  Diagnosis Date  . Anemia   . Arthritis    "back, neck" (07/28/2014)  . Blind    both eyes removed   . Bruises easily   . CAD, NATIVE VESSEL 01/08/2008      . Cellulitis late 1980's   "hospitalized; wrapped both legs; several times; no OR for this"  . Colon polyps   . Diabetic neuropathy ([place])   . DM type 2 (diabetes mellitus, type 2) ([place])    no medications (07/28/2014)  . ESRD (end stage renal disease) on dialysis [facility])    [name]; Mon, Wed, Fri (07/28/2014)  . Family history of breast cancer    mother  . HYPERLIPIDEMIA-MIXED 01/08/2008  . HYPERTENSION 01/27/2009   BP low  . Hypotension   . Kidney stones   . Low blood pressure   . OVERWEIGHT/OBESITY 01/08/2008   Lost 205 lbs through diet and exercise.    . Peripheral vascular disease ([place])    vein stripping  . Pneumonia 2000;s X 1  . Prostate cancer ([place])   . Prosthetic eye globe    both eyes  . Renal cell carcinoma 2001 and 2003   "both kidneys"  . Renal insufficiency   :  Past Surgical History:  Procedure Laterality Date  . AV FISTULA PLACEMENT Left 02/2010   LFA  . AV FISTULA PLACEMENT Left 01/12/2016   Procedure: LEFT BRACHIOCEPHALIC ARTERIOVENOUS (AV) FISTULA CREATION;  Surgeon: Angelia Mould, MD;  Location:  [facility] OR;  Service: Vascular;  Laterality: Left;  . CHOLECYSTECTOMY OPEN  1992  . COLONOSCOPY  06/14/2011   Procedure: COLONOSCOPY;  Surgeon: [name], MD;  Location: [facility] ENDOSCOPY;  Service: Endoscopy;  Laterality: N/A;  . COLONOSCOPY N/A 07/24/2012   Procedure: COLONOSCOPY;  Surgeon: [name], MD;  Location: [facility] ENDOSCOPY;  Service: Endoscopy;  Laterality: N/A;  . COLONOSCOPY    . COLONOSCOPY N/A 11/04/2015   Procedure: COLONOSCOPY;  Surgeon: Manus Gunning, MD;  Location: [facility] ENDOSCOPY;  Service:  Gastroenterology;  Laterality: N/A;  . COLONOSCOPY WITH PROPOFOL N/A 05/13/2014   Procedure: COLONOSCOPY WITH PROPOFOL;  Surgeon: [name], MD;  Location: [facility] ENDOSCOPY;  Service: Endoscopy;  Laterality: N/A;  . CORONARY ARTERY BYPASS GRAFT  09/29/2011   Procedure: CORONARY ARTERY BYPASS GRAFTING (CABG);  Surgeon: [name], MD;  Location: [place];  Service: Open Heart Surgery;  Laterality: N/A;  Coronary Artery Bypass Graft times two utilizing the left internal mammary artery and the right greater saphenous vein harvested endoscopically.  . CYSTOSCOPY WITH RETROGRADE PYELOGRAM, URETEROSCOPY AND STENT PLACEMENT Left 07/28/2014   Procedure: CYSTOSCOPY WITH RETROGRADE PYELOGRAM, URETERAL BALLOON DILITATION, URETEROSCOPY AND LEFT STENT PLACEMENT;  Surgeon: [name], MD;  Location: [facility] ORS;  Service: Urology;  Laterality: Left;  . ENUCLEATION  2003; 2006   bilateral; "diabetes; pain"  . FOOT AMPUTATION Right ~ 2002   right;  partial; "infection"  . FRACTURE SURGERY    . HIP PINNING,CANNULATED Left 12/31/2013   Procedure: CANNULATED HIP PINNING;  Surgeon: Renette Butters, MD;  Location: [place];  Service: Orthopedics;  Laterality: Left;  Carm, FX Table, [employer]  . HOT HEMOSTASIS  06/14/2011   Procedure: HOT HEMOSTASIS (ARGON PLASMA COAGULATION/BICAP);  Surgeon: [name], MD;  Location: [facility];  Service: Endoscopy;  Laterality: N/A;  . INSERTION OF DIALYSIS CATHETER Right 01/12/2016   Procedure: INSERTION OF DIALYSIS CATHETER;  Surgeon: Angelia Mould, MD;  Location: [facility];  Service: Vascular;  Laterality: Right;  . INSERTION PROSTATE RADIATION SEED  03/2012  . LEFT HEART CATHETERIZATION WITH CORONARY ANGIOGRAM N/A 09/27/2011   Procedure: LEFT HEART CATHETERIZATION WITH CORONARY ANGIOGRAM;  Surgeon: [name], MD;  Location: [facility] CATH LAB;  Service: Cardiovascular;  Laterality: N/A;  . LIGATION OF ARTERIOVENOUS  FISTULA Left 01/12/2016   Procedure: LIGATION OF LEFT  RADIOCEPHALIC ARTERIOVENOUS  FISTULA;  Surgeon: Angelia Mould, MD;  Location: [place];  Service: Vascular;  Laterality: Left;  . LIGATION OF COMPETING BRANCHES OF ARTERIOVENOUS FISTULA Left 07/29/2014   Procedure: LIGATION OF COMPETING BRANCHES OF LEFT ARM ARTERIOVENOUS FISTULA;  Surgeon: [name], MD;  Location: [place];  Service: Vascular;  Laterality: Left;  . NEPHRECTOMY Right 01/30/2015   done at [place]  . RADIOFREQUENCY ABLATION KIDNEY  2010-2012   "twice; one on each side; for cancer"  . REVISON OF ARTERIOVENOUS FISTULA Left 09/01/2015   Procedure: EXPLORATION LEFT LOWER ARM  RADIOCEPHALIC ARTERIOVENOUS FISTULA;  Surgeon: Conrad Yoder, MD;  Location: [facility];  Service: Vascular;  Laterality: Left;  [name] VARICOSE VEIN SURGERY  mid 1980's    BLE; "knees down; both legs; 2 separate times"  :   Current Outpatient Prescriptions:  .  aspirin EC 81 MG tablet, Take 81 mg by mouth at bedtime., Disp: , Rfl:  .  atorvastatin (LIPITOR) 10 MG tablet, TAKE 1 TABLET BY MOUTH EVERY DAY (Patient taking differently: TAKE 1 TABLET BY MOUTH DAILY AT BEDTIME), Disp: 30 tablet, Rfl:  6 .  calcitRIOL (ROCALTROL) 0.25 MCG capsule, Take 0.25 mcg by mouth 3 (three) times a week. Reported on 08/26/2015, Disp: , Rfl:  .  cinacalcet (SENSIPAR) 90 MG tablet, Take 90 mg by mouth daily., Disp: , Rfl:  .  clotrimazole (LOTRIMIN) 1 % external solution, , Disp: , Rfl:  .  heparin 5000 UNIT/ML injection, Inject 8,000 Units into the skin 3 (three) times a week. Monday, Wednesday, and Friday., Disp: , Rfl:  .  Methoxy PEG-Epoetin Beta (MIRCERA) 50 MCG/0.3ML SOSY, Inject 225 mcg as directed every 14 (fourteen) days. , Disp: , Rfl:  .  midodrine (PROAMATINE) 10 MG tablet, Take 1 tablet (10 mg total) by mouth 3 (three) times daily with meals., Disp: 90 tablet, Rfl: 0 .  multivitamin (RENA-VIT) TABS tablet, Take 1 tablet by mouth at bedtime., Disp: , Rfl:  .  Probiotic Product (ALIGN) 4 MG CAPS, Take 1 capsule by mouth  daily., Disp: , Rfl:  .  SENSIPAR 30 MG tablet, , Disp: , Rfl:  .  sevelamer carbonate (RENVELA) 800 MG tablet, Take 2,400-4,800 mg by mouth See admin instructions. Take 6 tablets (4800 mg) by mouth 3 times daily with meals and 3 tablets (2400 mg) 2 times daily with snacks, Disp: , Rfl:  No current facility-administered medications for this visit.   Facility-Administered Medications Ordered in Other Visits:  .  6 CHG cloth bath - Day of surgery, , , Once **AND** Chlorhexidine Gluconate Cloth 2 % PADS 6 each, 6 each, Topical, Once, [name], MD:  Allergies  Allergen Reactions  . Codeine Other (See Comments)     [place]  . Tape Other (See Comments)    Plastic tape tears skin off, please use paper tape instead.  :  Family History  Problem Relation Age of Onset  . Cancer Mother 64    breast, spine and ovarian  . Heart disease Mother   . Hyperlipidemia Mother   . Hypertension Mother   . Varicose Veins Mother   . Emphysema Father   . Cancer Father 49    kidney, prostate  . Heart disease Father   . Hyperlipidemia Father   . Hypertension Father   . Cancer Sister     ovarian  . Cancer Brother     lung  . Heart disease Brother   . Hyperlipidemia Brother   . Hypertension Brother   . Heart attack Brother   . Peripheral vascular disease Brother   . Cancer    . Coronary artery disease    . Kidney disease    . Breast cancer    . Ovarian cancer    . Lung cancer    . Diabetes    . Malignant hyperthermia Neg Hx   :  Social History   Social History  . Marital status: Married    Spouse name: N/A  . Number of children: N/A  . Years of education: N/A   Occupational History  . Not on file.   Social History Main Topics  . Smoking status: Never Smoker  . Smokeless tobacco: Never Used  . Alcohol use No  . Drug use: No  . Sexual activity: No   Other Topics Concern  . Not on file   Social History Narrative  . No narrative on file  :  Pertinent  items are noted in HPI.  Exam: Blood pressure (!) 95/48, pulse 75, temperature 98.4 F (36.9 C), temperature source Oral, resp. rate 18, height 6\' 4"  (  1.93 m), SpO2 100 %.  ECOG 1 General appearance: alert and cooperative appeared without distress. Head: Normocephalic, without obvious abnormality Throat: lips, mucosa, and tongue normal; teeth and gums normal. No oral thrush. Neck: no adenopathy Resp: clear to auscultation bilaterally Chest wall: no tenderness GI: soft, non-tender; bowel sounds normal; no masses,  no organomegaly Extremities: extremities normal, atraumatic, no cyanosis or edema Pulses: 2+ and symmetric Skin: Skin color, texture, turgor normal. No rashes or lesions no ecchymosis or rash. Lymph nodes: Cervical, supraclavicular, and axillary nodes normal.  CBC    Component Value Date/Time   WBC 4.5 02/02/2016 0616   RBC 2.99 (L) 02/02/2016 0616   HGB 9.9 (L) 02/02/2016 0616   HCT 30.1 (L) 02/02/2016 0616   HCT 25.7 (L) 11/03/2015 0553   PLT 105 (L) 02/02/2016 0616   MCV 100.7 (H) 02/02/2016 0616   MCH 33.1 02/02/2016 0616   MCHC 32.9 02/02/2016 0616   RDW 14.1 02/02/2016 0616   LYMPHSABS 0.7 02/02/2016 0616   MONOABS 0.3 02/02/2016 0616   EOSABS 0.2 02/02/2016 0616   BASOSABS 0.0 02/02/2016 0616      Chemistry      Component Value Date/Time   NA 138 02/02/2016 0616   K 4.3 02/02/2016 0616   CL 96 (L) 02/02/2016 0616   CO2 31 02/02/2016 0616   BUN 55 (H) 02/02/2016 0616   CREATININE 4.64 (H) 02/02/2016 0616   CREATININE 4.43 (H) 09/29/2014 1422      Component Value Date/Time   CALCIUM 8.7 (L) 02/02/2016 0616   ALKPHOS 102 11/04/2015 0705   AST 38 11/04/2015 0705   ALT 31 11/04/2015 0705   BILITOT 1.2 11/04/2015 0705       Ct Chest W Contrast  Result Date: 02/15/2016 CLINICAL DATA:  Renal cell carcinoma with right nephrectomy. EXAM: CT CHEST WITH CONTRAST TECHNIQUE: Multidetector CT imaging of the chest was performed during intravenous contrast  administration. CONTRAST:  69mL ISOVUE-300 IOPAMIDOL (ISOVUE-300) INJECTION 61% COMPARISON:  Abdomen and pelvis CT from 11/04/2015 FINDINGS: Cardiovascular: Heart size is normal. Patient is status post CABG. Coronary artery atherosclerosis evident. Atherosclerotic calcification is noted in the wall of the thoracic aorta. Right IJ catheter tip projects at the distal SVC, just proximal to the SVC/RA junction. Mediastinum/Nodes: No mediastinal lymphadenopathy. There is no hilar lymphadenopathy. The esophagus has normal imaging features. There is no axillary lymphadenopathy. Lungs/Pleura: Mild to moderate emphysema noted with diffuse bronchial wall thickening. Compressive atelectasis is evident in the posterior right lower lobe. No suspicious pulmonary nodule or mass. No focal airspace consolidation. No evidence for pulmonary edema or pleural effusion. Upper Abdomen: Visualized portions of the liver and spleen are unremarkable. There is abdominal aortic atherosclerosis. Heterogeneously enhancing mass inferior to the liver, in the right nephrectomy bed, has progressed measuring 5.8 cm today compared to 3.7 cm on 11/04/2015. Musculoskeletal: Bone windows reveal no worrisome lytic or sclerotic osseous lesions. IMPRESSION: 1. No evidence for metastatic disease in the chest. 2. Emphysema with coronary artery and thoracoabdominal aortic atherosclerosis. 3. Interval progression of the right upper retroperitoneal mass, biopsy proven clear cell carcinoma. Electronically Signed   By: Misty Stanley M.D.   On: 02/15/2016 14:56   Ct Biopsy  Result Date: 02/02/2016 CLINICAL DATA:  History of renal cell carcinoma and right nephrectomy. Increasing size of soft tissue mass in the right retroperitoneum concerning for recurrent carcinoma. EXAM: CT GUIDED CORE BIOPSY OF RETROPERITONEAL MASS COMPARISON:  CT of the abdomen on 11/04/2015 ANESTHESIA/SEDATION: 1.5 mg IV Versed; 75 mcg  IV Fentanyl Total Moderate Sedation Time:  15 minutes.  The patient's level of consciousness and physiologic status were continuously monitored during the procedure by Radiology nursing. PROCEDURE: The procedure risks, benefits, and alternatives were explained to the patient. Questions regarding the procedure were encouraged and answered. The patient understands and consents to the procedure. A time-out was performed prior to initiating the procedure. The right posterolateral abdominal wall was prepped with chlorhexidine in a sterile fashion, and a sterile drape was applied covering the operative field. A sterile gown and sterile gloves were used for the procedure. Local anesthesia was provided with 1% Lidocaine. CT was performed with the right side rolled up. After localizing a site for needle placement, a 17 gauge needle was advanced under CT guidance to the level of a retroperitoneal soft tissue mass. After confirming needle tip position, coaxial 18 gauge core biopsy samples were obtained. Three samples were obtained and submitted in formalin. COMPLICATIONS: None FINDINGS: Initial CT shows enlargement of a soft tissue mass in the right retroperitoneal space posterior to the ascending colon and immediately inferior to the liver. Maximum diameter of the soft tissue mass is 4.7 cm. Solid tissue was obtained. IMPRESSION: CT-guided core biopsy performed of right retroperitoneal soft tissue mass. The mass shows enlargement since August with increase in maximal diameter from approximately 3.7 cm to 4.7 cm. Electronically Signed   By: Aletta Edouard M.D.   On: 02/02/2016 15:05    Assessment and Plan:   65 year old gentleman with the following issues:  1. Renal cell carcinoma documented in October 2017. He was found to have a 4.7 cm mass following an attempted nephrectomy in preparation for renal transplant. This is a biopsy proven on 02/02/2016 show renal cell carcinoma. CT scan of the chest obtained on 02/15/2016 did not show any evidence of metastatic  disease.  His case was discussed and the GU tumor board on 02/12/2016. Imaging studies were discussed with radiology and his pathology results were reviewed by the reviewing pathologist. These findings do suggest localized renal cell carcinoma that arise from his kidney that has been removed surgically in 2016. Options of therapy were reviewed today which include primary surgical therapy which remains his best curative option. The limitation associated with this approach include his previous surgery as well as his other comorbid conditions and limited performance status.  Alternative options include radiation therapy and systemic therapy were reviewed today. I think those are options that could be contemplated if primary surgical therapy is not feasible or in-depth filling in the future. Different options of systemic therapy were discussed today including oral biological agents versus immune therapy which I think would be a reasonable options for him for palliative purposes if needed to in the future.  He is willing to proceed with primary surgical therapy and he will undergo cardiac clearance before presumably undergoing this procedure in January 2018.  2. Anemia: Related due to his renal disease as well as previous sepsis and hospitalizations.  3. Thrombocytopenia: Related to sepsis and appears to have improved. His platelet count currently above 100,000 and should be more than adequate to proceed with surgery.  I'm happy to see him in the future as needed.

## 2016-02-17 ENCOUNTER — Telehealth: Payer: Self-pay | Admitting: Cardiology

## 2016-02-17 NOTE — Telephone Encounter (Signed)
Records received from [facility] for apt on 02/22/16 with Kerin Ransom PA. Records given to [employer] (medical records) CN

## 2016-02-19 ENCOUNTER — Encounter: Payer: Self-pay | Admitting: Cardiology

## 2016-02-19 ENCOUNTER — Ambulatory Visit: Payer: Medicare Other | Admitting: Cardiology

## 2016-02-19 ENCOUNTER — Ambulatory Visit (INDEPENDENT_AMBULATORY_CARE_PROVIDER_SITE_OTHER): Payer: Medicare Other | Admitting: Cardiology

## 2016-02-19 DIAGNOSIS — C641 Malignant neoplasm of right kidney, except renal pelvis: Secondary | ICD-10-CM | POA: Diagnosis not present

## 2016-02-19 DIAGNOSIS — N186 End stage renal disease: Secondary | ICD-10-CM | POA: Diagnosis not present

## 2016-02-19 DIAGNOSIS — Z01810 Encounter for preprocedural cardiovascular examination: Secondary | ICD-10-CM | POA: Diagnosis not present

## 2016-02-19 DIAGNOSIS — H543 Unqualified visual loss, both eyes: Secondary | ICD-10-CM

## 2016-02-19 DIAGNOSIS — I10 Essential (primary) hypertension: Secondary | ICD-10-CM | POA: Diagnosis not present

## 2016-02-19 DIAGNOSIS — Z992 Dependence on renal dialysis: Secondary | ICD-10-CM

## 2016-02-19 NOTE — Patient Instructions (Addendum)
Medication Instructions:  Continue current medications  Labwork: None Ordered  Testing/Procedures: None Ordered  Follow-Up: Your physician recommends that you schedule a follow-up appointment in: 3 Months with Dr Percival Spanish   Any Other Special Instructions Will Be Listed Below (If Applicable).           Happy Thanksgiving   If you need a refill on your cardiac medications before your next appointment, please call your pharmacy.

## 2016-02-19 NOTE — Assessment & Plan Note (Signed)
Pt seen today for cardiac clearance prior to proposed Rt renal cell cancer

## 2016-02-19 NOTE — Progress Notes (Signed)
02/19/2016 [name]   1950-06-11  [id number]  Primary Physician Nicholas Redwood, MD Primary Cardiologist: Dr [name]  HPI:  65 y/o blind male with a history of ESRD and CAD, here for pre op clearance prior to exploritory laperotomy and excision of retroperitoneal mass. The pt had a Myoview in preparation for renal transplant in 2013. This was abnormal and subsequent cath showed significant CAD. He underwent CABG x 2 with am LIMA-LAD and SVG-OM 09/29/11. He has know renal cell cancer treated in 2009 and 2011. He was found to have recurrent renal cell cancer and underwent Rt nephrectomy in Oct 2016. This course was complicated by abscess and multiple hospitalizations.  He was again on the transplant list and had a [name] at [name] Feb 2017 to clear him. The pt saw [name] In April for pre op cardia clearance and this was granted but because of recurrent problems with infection he never did receive a transplant. In Aug 2017 he was hospitalized with fever and a mass was found. Biopsy 02/02/16 confirmed renal cell cancer. He is referred now for pre op clearance. He denies any chest pain or unusual dyspnea.      Current Outpatient Prescriptions  Medication Sig Dispense Refill  . aspirin EC 81 MG tablet Take 81 mg by mouth at bedtime.    [name] atorvastatin (LIPITOR) 10 MG tablet TAKE 1 TABLET BY MOUTH EVERY DAY (Patient taking differently: TAKE 1 TABLET BY MOUTH DAILY AT BEDTIME) 30 tablet 6  . calcitRIOL (ROCALTROL) 0.25 MCG capsule Take 0.25 mcg by mouth 3 (three) times a week. Reported on 08/26/2015    . cinacalcet (SENSIPAR) 90 MG tablet Take 90 mg by mouth daily.    . clotrimazole (LOTRIMIN) 1 % external solution     . heparin 5000 UNIT/ML injection Inject 8,000 Units into the skin 3 (three) times a week. Monday, Wednesday, and Friday.    . Methoxy PEG-Epoetin Beta (MIRCERA) 50 MCG/0.3ML SOSY Inject 225 mcg as directed every 14 (fourteen) days.     . midodrine (PROAMATINE) 10 MG  tablet Take 1 tablet (10 mg total) by mouth 3 (three) times daily with meals. 90 tablet 0  . multivitamin (RENA-VIT) TABS tablet Take 1 tablet by mouth at bedtime.    . Probiotic Product (ALIGN) 4 MG CAPS Take 1 capsule by mouth daily.    . SENSIPAR 30 MG tablet     . sevelamer carbonate (RENVELA) 800 MG tablet Take 2,400-4,800 mg by mouth See admin instructions. Take 6 tablets (4800 mg) by mouth 3 times daily with meals and 3 tablets (2400 mg) 2 times daily with snacks     No current facility-administered medications for this visit.    Facility-Administered Medications Ordered in Other Visits  Medication Dose Route Frequency Provider Last Rate Last Dose  . Chlorhexidine Gluconate Cloth 2 % PADS 6 each  6 each Topical Once Nicholas Dutch, MD        Allergies  Allergen Reactions  . Codeine Other (See Comments)     [place]  . Tape Other (See Comments)    Plastic tape tears skin off, please use paper tape instead.    Social History   Social History  . Marital status: Married    Spouse name: N/A  . Number of children: N/A  . Years of education: N/A   Occupational History  . Not on file.   Social History Main Topics  . Smoking status: Never Smoker  .  Smokeless tobacco: Never Used  . Alcohol use No  . Drug use: No  . Sexual activity: No   Other Topics Concern  . Not on file   Social History Narrative  . No narrative on file    Past Medical History:  Diagnosis Date  . Anemia   . Arthritis    "back, neck" (07/28/2014)  . Blind    both eyes removed   . Bruises easily   . CAD, NATIVE VESSEL 01/08/2008      . Cellulitis late 1980's   "hospitalized; wrapped both legs; several times; no OR for this"  . Colon polyps   . Diabetic neuropathy ([place])   . DM type 2 (diabetes mellitus, type 2) ([place])    no medications (07/28/2014)  . ESRD (end stage renal disease) on dialysis [place] [name])    [name]; Mon, Wed, Fri (07/28/2014)  . Family history of breast cancer     mother  . HYPERLIPIDEMIA-MIXED 01/08/2008  . HYPERTENSION 01/27/2009   BP low  . Hypotension   . Kidney stones   . Low blood pressure   . OVERWEIGHT/OBESITY 01/08/2008   Lost 205 lbs through diet and exercise.    . Peripheral vascular disease ([place])    vein stripping  . Pneumonia 2000;s X 1  . Prostate cancer ([place])   . Prosthetic eye globe    both eyes  . Renal cell carcinoma 2001 and 2003   "both kidneys"  . Renal insufficiency    Family History  Problem Relation Age of Onset  . Cancer Mother 36    breast, spine and ovarian  . Heart disease Mother   . Hyperlipidemia Mother   . Hypertension Mother   . Varicose Veins Mother   . Emphysema Father   . Cancer Father 62    kidney, prostate  . Heart disease Father   . Hyperlipidemia Father   . Hypertension Father   . Cancer Sister     ovarian  . Cancer Brother     lung  . Heart disease Brother   . Hyperlipidemia Brother   . Hypertension Brother   . Heart attack Brother   . Peripheral vascular disease Brother   . Cancer    . Coronary artery disease    . Kidney disease    . Breast cancer    . Ovarian cancer    . Lung cancer    . Diabetes    . Malignant hyperthermia Neg Hx    Review of Systems: General: negative for chills, fever, night sweats or weight changes.  Cardiovascular: negative for chest pain, dyspnea on exertion, edema, orthopnea, palpitations, paroxysmal nocturnal dyspnea or shortness of breath Dermatological: negative for rash Respiratory: negative for cough or wheezing Urologic: negative for hematuria Abdominal: negative for nausea, vomiting, diarrhea, bright red blood per rectum, melena, or hematemesis Neurologic: negative for visual changes, syncope, or dizziness All other systems reviewed and are otherwise negative except as noted above.    Blood pressure (!) 100/52, pulse 69, height 6\' 4"  (1.93 m), weight 197 lb 6.4 oz (89.5 kg).  General appearance: alert, cooperative, no distress and  blind Neck: no carotid bruit and no JVD Chest: external dialysis catheter noted Lungs: clear to auscultation bilaterally Heart: regular rate and rhythm Extremities: no edema Pulses: 2+ and symmetric Skin: Skin color, texture, turgor normal. No rashes or lesions Neurologic: Grossly normal  EKG NSR  ASSESSMENT AND PLAN:   Pre-operative cardiovascular examination Pt seen today for cardiac clearance prior to proposed  Rt renal cell cancer  Hx of CABG-2013 CABG x 2 LIMA-LAD, SVG-OM 09/29/15 Myoview at [facility] Feb 2017 low risk  End stage renal failure on dialysis ([place]) ESRD- HD MWF-Dr [name] follows  Essential hypertension Controlled  Renal carcinoma ([place]) ESRD- HD MWF-Dr [name] follows  Blind   PLAN   I reviewed [name]'s case with dr [name] in the offcie today. He has had fairly recent revascularization and a low risk Myoview in feb 2017. He has no anginal symptoms. He is a moderate but acceptable risk for proposed surgery from a cardiac standpoint. OK to stop ASA pre op if needed. We will be available as needed post op.    Kerin Ransom PA-C 02/19/2016 4:11 PM

## 2016-02-19 NOTE — Assessment & Plan Note (Signed)
ESRD- HD MWF-Dr Deterding follows

## 2016-02-19 NOTE — Assessment & Plan Note (Signed)
Controlled.  

## 2016-02-19 NOTE — Assessment & Plan Note (Signed)
CABG x 2 LIMA-LAD, SVG-OM 09/29/15 Myoview at [facility] Feb 2017 low risk

## 2016-02-22 ENCOUNTER — Ambulatory Visit: Payer: Medicare Other | Admitting: Cardiology

## 2016-02-23 ENCOUNTER — Encounter: Payer: Self-pay | Admitting: Interventional Radiology

## 2016-03-01 ENCOUNTER — Telehealth: Payer: Self-pay | Admitting: Cardiology

## 2016-03-01 NOTE — Telephone Encounter (Signed)
Harrington Challenger says Dr Alinda Money is waiting on a letter of clearance for surgery to remove pt's kidney that have cancer in it.He needs this letter asap please.Please call Ross at [facility] when this have been completed-[phone]

## 2016-03-01 NOTE — Telephone Encounter (Signed)
Left detailed message for [facility]

## 2016-03-01 NOTE — Telephone Encounter (Signed)
This was sent on day of appointment I re-sent it today

## 2016-03-03 ENCOUNTER — Ambulatory Visit (INDEPENDENT_AMBULATORY_CARE_PROVIDER_SITE_OTHER): Payer: Medicare Other | Admitting: Podiatry

## 2016-03-03 ENCOUNTER — Encounter: Payer: Self-pay | Admitting: Podiatry

## 2016-03-03 ENCOUNTER — Other Ambulatory Visit: Payer: Self-pay | Admitting: Urology

## 2016-03-03 DIAGNOSIS — I739 Peripheral vascular disease, unspecified: Secondary | ICD-10-CM | POA: Diagnosis not present

## 2016-03-03 DIAGNOSIS — E1142 Type 2 diabetes mellitus with diabetic polyneuropathy: Secondary | ICD-10-CM | POA: Diagnosis not present

## 2016-03-03 DIAGNOSIS — L84 Corns and callosities: Secondary | ICD-10-CM

## 2016-03-03 DIAGNOSIS — B351 Tinea unguium: Secondary | ICD-10-CM | POA: Diagnosis not present

## 2016-03-03 DIAGNOSIS — IMO0002 Reserved for concepts with insufficient information to code with codable children: Secondary | ICD-10-CM

## 2016-03-03 DIAGNOSIS — Z89431 Acquired absence of right foot: Secondary | ICD-10-CM

## 2016-03-04 NOTE — Progress Notes (Signed)
Subjective: 65 y.o. returns the office today for painful, elongated, thickened toenails which he cannot trim himself. Denies any redness or drainage around the nails. He has a callus to his left heel. Denies any swelling or drainage. Denies any acute changes since last appointment and no new complaints today. Denies any systemic complaints such as fevers, chills, nausea, vomiting.   Objective: AAO 3, NAD DP/PT pulses palpable 1/4, CRT less than 3 seconds Nails hypertrophic, dystrophic, elongated, brittle, discolored 5. There is tenderness overlying the nails 1-5 on the left. There is no surrounding erythema or drainage along the nail sites. Hyperkeratotic lesion to the left posterior medial heel. Upon debridement no underlying ulceration, drainage or any signs of infection. Transmetatarsal amputation right foot No open lesions or pre-ulcerative lesions are identified. No other areas of tenderness bilateral lower extremities. No overlying edema, erythema, increased warmth. No pain with calf compression, swelling, warmth, erythema.  Assessment: Patient presents with symptomatic onychomycosis; hyperkeratotic lesion  Plan: -Treatment options including alternatives, risks, complications were discussed -Nails sharply debrided 5 without complication/bleeding. -Hyperkeratotic lesion debrided 1 without complications or bleeding. -Discussed daily foot inspection. If there are any changes, to call the office immediately.  -Follow-up in 3 months or sooner if any problems are to arise. In the meantime, encouraged to call the office with any questions, concerns, changes symptoms.  Celesta Gentile, DPM

## 2016-03-15 ENCOUNTER — Ambulatory Visit: Payer: Medicare Other | Admitting: Sports Medicine

## 2016-03-31 ENCOUNTER — Encounter (HOSPITAL_COMMUNITY): Payer: Self-pay

## 2016-04-01 NOTE — Patient Instructions (Addendum)
Dameian Crisman Schnelle  04/01/2016   Your procedure is scheduled on: Thursday 04/07/2016  Report to [facility]  Entrance take [place]  elevators to 3rd floor to  [place] at [phone].  Call this number if you have problems the morning of surgery [phone]   Remember: ONLY 1 PERSON MAY GO WITH YOU TO SHORT STAY TO GET  READY MORNING OF [place].  Do not eat food After Midnight Tuesday night. Clear liquids all day Wednesday per Dr Alinda Money. Follow all bowel prep instructions per Dr.  Alinda Money.             Magnesium citrate - one bottle drink by 12 noon day before surgery              Clear liquid day diet day before surgery.               Fleets enema nite before surgery.       Take these medicines the morning of surgery with A SIP OF WATER: NONE                                You may not have any metal on your body including hair pins and              piercings  Do not wear jewelry, , lotions, powders or perfumes, deodorant .              Men may shave face and neck.   Do not bring valuables to the hospital. [place].  Contacts, dentures or bridgework may not be worn into surgery.  Leave suitcase in the car. After surgery it may be brought to your room.     _____________________________________________________________________     CLEAR LIQUID DIET   Foods Allowed                                                                     Foods Excluded  Coffee and tea, regular and decaf                             liquids that you cannot  Plain Jell-O in any flavor                                             see through such as: Fruit ices (not with fruit pulp)                                     milk, soups, orange juice  Iced Popsicles  All solid food Carbonated beverages, regular and diet                                    Cranberry, grape and apple juices Sports  drinks like Gatorade Lightly seasoned clear broth or consume(fat free) Sugar, honey syrup  Sample Menu Breakfast                                Lunch                                     Supper Cranberry juice                    Beef broth                            Chicken broth Jell-O                                     Grape juice                           Apple juice Coffee or tea                        Jell-O                                      Popsicle                                                Coffee or tea                        Coffee or tea  _____________________________________________________________________    Incentive Spirometer  An incentive spirometer is a tool that can help keep your lungs clear and active. This tool measures how well you are filling your lungs with each breath. Taking long deep breaths may help reverse or decrease the chance of developing breathing (pulmonary) problems (especially infection) following:  A long period of time when you are unable to move or be active. BEFORE THE PROCEDURE   If the spirometer includes an indicator to show your best effort, your nurse or respiratory therapist will set it to a desired goal.  If possible, sit up straight or lean slightly forward. Try not to slouch.  Hold the incentive spirometer in an upright position. INSTRUCTIONS FOR USE  1. Sit on the edge of your bed if possible, or sit up as far as you can in bed or on a chair. 2. Hold the incentive spirometer in an upright position. 3. Breathe out normally. 4. Place the mouthpiece in your mouth and seal your lips tightly around it. 5. Breathe in slowly and as deeply as possible, raising the piston or the ball toward the top of the column. 6. Hold your breath for 3-5 seconds  or for as long as possible. Allow the piston or ball to fall to the bottom of the column. 7. Remove the mouthpiece from your mouth and breathe out normally. 8. Rest for a few seconds and  repeat Steps 1 through 7 at least 10 times every 1-2 hours when you are awake. Take your time and take a few normal breaths between deep breaths. 9. The spirometer may include an indicator to show your best effort. Use the indicator as a goal to work toward during each repetition. 10. After each set of 10 deep breaths, practice coughing to be sure your lungs are clear. If you have an incision (the cut made at the time of surgery), support your incision when coughing by placing a pillow or rolled up towels firmly against it. Once you are able to get out of bed, walk around indoors and cough well. You may stop using the incentive spirometer when instructed by your caregiver.  RISKS AND COMPLICATIONS  Take your time so you do not get dizzy or light-headed.  If you are in pain, you may need to take or ask for pain medication before doing incentive spirometry. It is harder to take a deep breath if you are having pain. AFTER USE  Rest and breathe slowly and easily.  It can be helpful to keep track of a log of your progress. Your caregiver can provide you with a simple table to help with this. If you are using the spirometer at home, follow these instructions: [place] IF:   You are having difficultly using the spirometer.  You have trouble using the spirometer as often as instructed.  Your pain medication is not giving enough relief while using the spirometer.  You develop fever of 100.5 F (38.1 C) or higher. SEEK IMMEDIATE MEDICAL CARE IF:   You cough up bloody sputum that had not been present before.  You develop fever of 102 F (38.9 C) or greater.  You develop worsening pain at or near the incision site. MAKE SURE YOU:   Understand these instructions.  Will watch your condition.  Will get help right away if you are not doing well or get worse. Document Released: 08/01/2006 Document Revised: 06/13/2011 Document Reviewed: 10/02/2006 ExitCare Patient Information 2014  [place].   ________________________________________________________________________            [facility] - Preparing for Surgery Before surgery, you can play an important role.  Because skin is not sterile, your skin needs to be as free of germs as possible.  You can reduce the number of germs on your skin by washing with CHG (chlorahexidine gluconate) soap before surgery.  CHG is an antiseptic cleaner which kills germs and bonds with the skin to continue killing germs even after washing. Please DO NOT use if you have an allergy to CHG or antibacterial soaps.  If your skin becomes reddened/irritated stop using the CHG and inform your nurse when you arrive at Short Stay. Do not shave (including legs and underarms) for at least 48 hours prior to the first CHG shower.  You may shave your face/neck. Please follow these instructions carefully:  1.  Shower with CHG Soap the night before surgery and the  morning of Surgery.  2.  If you choose to wash your hair, wash your hair first as usual with your  normal  shampoo.  3.  After you shampoo, rinse your hair and body thoroughly to remove the  shampoo.  4.  Use CHG as you would any other liquid soap.  You can apply chg directly  to the skin and wash                       Gently with a scrungie or clean washcloth.  5.  Apply the CHG Soap to your body ONLY FROM THE NECK DOWN.   Do not use on face/ open                           Wound or open sores. Avoid contact with eyes, ears mouth and genitals (private parts).                       Wash face,  Genitals (private parts) with your normal soap.             6.  Wash thoroughly, paying special attention to the area where your surgery  will be performed.  7.  Thoroughly rinse your body with warm water from the neck down.  8.  DO NOT shower/wash with your normal soap after using and rinsing off  the CHG Soap.                9.  Pat yourself dry with a clean towel.             10.  Wear clean pajamas.            11.  Place clean sheets on your bed the night of your first shower and do not  sleep with pets. Day of Surgery : Do not apply any lotions/deodorants the morning of surgery.  Please wear clean clothes to the hospital/surgery center.  FAILURE TO FOLLOW THESE INSTRUCTIONS MAY RESULT IN THE CANCELLATION OF YOUR SURGERY PATIENT SIGNATURE_________________________________  NURSE SIGNATURE__________________________________  ________________________________________________________________________                              _____________________________________________________________________                  _____________________________________________________________________    WHAT IS A BLOOD TRANSFUSION? Blood Transfusion Information  A transfusion is the replacement of blood or some of its parts. Blood is made up of multiple cells which provide different functions.  Red blood cells carry oxygen and are used for blood loss replacement.  White blood cells fight against infection.  Platelets control bleeding.  Plasma helps clot blood.  Other blood products are available for specialized needs, such as hemophilia or other clotting disorders. BEFORE THE TRANSFUSION  Who gives blood for transfusions?   Healthy volunteers who are fully evaluated to make sure their blood is safe. This is blood bank blood. Transfusion therapy is the safest it has ever been in the practice of medicine. Before blood is taken from a donor, a complete history is taken to make sure that person has no history of diseases nor engages in risky social behavior (examples are intravenous drug use or sexual activity with multiple partners). The donor's travel history is screened to minimize risk of transmitting infections, such as malaria. The donated blood is tested for signs of infectious diseases, such as HIV and hepatitis. The blood is then tested to be sure it is  compatible with you in order to minimize the chance of a transfusion reaction. If you or a relative donates blood, this is often done  in anticipation of surgery and is not appropriate for emergency situations. It takes many days to process the donated blood. RISKS AND COMPLICATIONS Although transfusion therapy is very safe and saves many lives, the main dangers of transfusion include:   Getting an infectious disease.  Developing a transfusion reaction. This is an allergic reaction to something in the blood you were given. Every precaution is taken to prevent this. The decision to have a blood transfusion has been considered carefully by your caregiver before blood is given. Blood is not given unless the benefits outweigh the risks. AFTER THE TRANSFUSION  Right after receiving a blood transfusion, you will usually feel much better and more energetic. This is especially true if your red blood cells have gotten low (anemic). The transfusion raises the level of the red blood cells which carry oxygen, and this usually causes an energy increase.  The nurse administering the transfusion will monitor you carefully for complications. HOME CARE INSTRUCTIONS  No special instructions are needed after a transfusion. You may find your energy is better. Speak with your caregiver about any limitations on activity for underlying diseases you may have. SEEK MEDICAL CARE IF:   Your condition is not improving after your transfusion.  You develop redness or irritation at the intravenous (IV) site. SEEK IMMEDIATE MEDICAL CARE IF:  Any of the following symptoms occur over the next 12 hours:  Shaking chills.  You have a temperature by mouth above 102 F (38.9 C), not controlled by medicine.  Chest, back, or muscle pain.  People around you feel you are not acting correctly or are confused.  Shortness of breath or difficulty breathing.  Dizziness and fainting.  You get a rash or develop hives.  You have  a decrease in urine output.  Your urine turns a dark color or changes to pink, red, or brown. Any of the following symptoms occur over the next 10 days:  You have a temperature by mouth above 102 F (38.9 C), not controlled by medicine.  Shortness of breath.  Weakness after normal activity.  The white part of the eye turns yellow (jaundice).  You have a decrease in the amount of urine or are urinating less often.  Your urine turns a dark color or changes to pink, red, or brown. Document Released: 03/18/2000 Document Revised: 06/13/2011 Document Reviewed: 11/05/2007 [facility] Patient Information 2014 [place], [place].  _______________________________________________________________________________________________________________________________________________

## 2016-04-05 ENCOUNTER — Encounter (HOSPITAL_COMMUNITY): Payer: Self-pay

## 2016-04-05 ENCOUNTER — Encounter (HOSPITAL_COMMUNITY)
Admission: RE | Admit: 2016-04-05 | Discharge: 2016-04-05 | Disposition: A | Discharge status: Home / Self Care | Payer: Medicare Other | Source: Ambulatory Visit | Attending: Urology | Admitting: Urology

## 2016-04-05 DIAGNOSIS — Z01812 Encounter for preprocedural laboratory examination: Secondary | ICD-10-CM | POA: Insufficient documentation

## 2016-04-05 HISTORY — DX: Personal history of other medical treatment: Z92.89

## 2016-04-05 HISTORY — DX: Cardiac murmur, unspecified: R01.1

## 2016-04-05 LAB — GLUCOSE, CAPILLARY: Glucose-Capillary: 137 mg/dL — ABNORMAL HIGH (ref 65–99)

## 2016-04-05 NOTE — Progress Notes (Signed)
[name] called back regarding hemodialysis arrangements the when/where will be decide by Dr Alinda Money post surgery.

## 2016-04-05 NOTE — Progress Notes (Addendum)
DR Marcell Barlow ( anesthesia) made aware patient takes Midodrine to increase blood pressure.  Per Dr Marcell Barlow okay for patient to take am of surgery.  Spoke with patient's wife and she is aware patient to take Midodrine am of surgery per anesthesia.

## 2016-04-05 NOTE — Progress Notes (Signed)
Left voice mail message for [name] at [facility] regarding the fact that patient is a dialysis patient and the when/where of dialysis after surgery.  I informed patient and wife that the when/where of dialysis will be decided by Dr Alinda Money.

## 2016-04-05 NOTE — Progress Notes (Addendum)
EKG 11/01/15 epic, CT chest 02/15/16 epic, [facility] 02/16/16 on chart.Echo 05/27/14 on chart, 02/19/16 Clearance- cardiology epic.

## 2016-04-06 DIAGNOSIS — K7689 Other specified diseases of liver: Secondary | ICD-10-CM | POA: Diagnosis not present

## 2016-04-06 DIAGNOSIS — D509 Iron deficiency anemia, unspecified: Secondary | ICD-10-CM | POA: Diagnosis not present

## 2016-04-06 DIAGNOSIS — N2581 Secondary hyperparathyroidism of renal origin: Secondary | ICD-10-CM | POA: Diagnosis not present

## 2016-04-06 DIAGNOSIS — E119 Type 2 diabetes mellitus without complications: Secondary | ICD-10-CM | POA: Diagnosis not present

## 2016-04-06 DIAGNOSIS — N186 End stage renal disease: Secondary | ICD-10-CM | POA: Diagnosis not present

## 2016-04-06 DIAGNOSIS — D631 Anemia in chronic kidney disease: Secondary | ICD-10-CM | POA: Diagnosis not present

## 2016-04-06 NOTE — Anesthesia Preprocedure Evaluation (Addendum)
Anesthesia Evaluation  Patient identified by MRN, date of birth, ID band Patient awake    Reviewed: Allergy & Precautions, NPO status , Patient's Chart, lab work & pertinent test results  Airway Mallampati: II  TM Distance: >3 FB Neck ROM: Full    Dental no notable dental hx.    Pulmonary neg pulmonary ROS,    Pulmonary exam normal breath sounds clear to auscultation       Cardiovascular hypertension, + CAD and + CABG  Normal cardiovascular exam Rhythm:Regular Rate:Normal     Neuro/Psych negative neurological ROS  negative psych ROS   GI/Hepatic negative GI ROS, Neg liver ROS,   Endo/Other  diabetes  Renal/GU DialysisRenal disease  negative genitourinary   Musculoskeletal negative musculoskeletal ROS (+)   Abdominal   Peds negative pediatric ROS (+)  Hematology negative hematology ROS (+)   Anesthesia Other Findings   Reproductive/Obstetrics negative OB ROS                             Anesthesia Physical Anesthesia Plan  ASA: III  Anesthesia Plan: General   Post-op Pain Management:    Induction: Intravenous  Airway Management Planned: Oral ETT  Additional Equipment:   Intra-op Plan:   Post-operative Plan: Extubation in OR  Informed Consent: I have reviewed the patients History and Physical, chart, labs and discussed the procedure including the risks, benefits and alternatives for the proposed anesthesia with the patient or authorized representative who has indicated his/her understanding and acceptance.   Dental advisory given  Plan Discussed with: CRNA and Surgeon  Anesthesia Plan Comments:         Anesthesia Quick Evaluation

## 2016-04-06 NOTE — H&P (Signed)
--------------------------------------------------------------------------------   [name]  MRN: [id number]  PRIMARY CARE:  Patrina Levering, MD  DOB: 1950/12/21, 66 year old Male  REFERRING:    SSN: [id number]  PROVIDER:  Raynelle Bring, M.D.    LOCATION:  [facility] [phone]   --------------------------------------------------------------------------------   CC/HPI: Recurrent renal cell carcinoma    [name] is a 66 year old gentleman with a very complex history of renal cell carcinoma.  His past medical history is significant for coronary artery disease status post coronary artery bypass grafting in 2013. He is followed by Dr. [name] but has not seen him in over one year. He denies any recent chest pain. He also has a long-standing history of diabetes since around age 78. This has resulted in complications including blindness and end stage renal disease. He currently no longer requires medical therapy for his diabetes after having lost significant weight. He is managed with diet and exercise. He began hemodialysis approximately 5-1/2 years ago. He currently receives hemodialysis Monday, Wednesday, and Friday. He does have a right internal jugular hemodialysis access catheter although recently had revision of his left upper extremity AV fistula that is maturing appropriately and should be ready for access after the first of the year. He also has a history of prostate cancer status post radiation seed implantation at [facility]. He continues to be followed by the radiation oncology department on an annual basis. He does not know his actual last PSA but believes that it was low indicating no recurrence. He has also been followed by Dr. [name] for kidney stones.   His past surgical history is significant for a prior open cholecystectomy in 1992, coronary artery bypass grafting, radiation seed implantation, and right nephrectomy.   He was initially  diagnosed with renal cell carcinoma proximally 7 years ago and was found to have a biopsy-proven clear cell carcinoma of the right kidney. He was treated with percutaneous ablation with radiofrequency ablation by [name]. He subsequently developed a contralateral clear cell carcinoma of the left kidney a few years later and was treated with percutaneous cryoablation. He subsequently developed end-stage renal disease requiring dialysis. He did pursue a possible transplant evaluation and ultimately was found to have a recurrent enhancing right renal mass at the site of his prior ablation defect on the right kidney. This was concerning for possible recurrence. He had been undergoing an evaluation at [facility] by their transplant Department. He was seen by the transplant surgeons and ultimately proceeded with a laparoscopic right nephrectomy in October 2016. This procedure was apparently quite complex likely related to his prior ablation. He suffered a very prolonged postoperative course developing a postoperative abscess that was quite large requiring drainage. His drains were ultimately removed approximately 8 months later in June 2017. His final pathology did not actually demonstrate any malignancy. He was admitted to the hospital in August for a febrile infection. He did undergo imaging as part of his evaluation demonstrated what appeared to be an enhancing mass in his right renal fossa raising concern for possible residual or recurrent abscess versus recurrent malignancy versus a benign process. I had actually personally seen him as a consult at that time. It was recommended that he follow-up with his surgeon who had performed his original nephrectomy. However, the patient and his wife state that they were not happy with the approach it was being taken by the surgeon who had recommended conservative follow-up imaging. They therefore saw [name]  proceeded with a percutaneous biopsy of this mass on  02/02/16 confirming clear cell carcinoma consistent with his primary renal cell carcinoma. Of note, this mass had increased in size from approximately 3.7 cm in August to 5.8 cm on his most recent imaging this November. There were no other findings to suggest metastatic disease to the abdomen. He also underwent a CT scan of the chest on 02/15/16 that demonstrated no evidence of metastatic disease to the lungs. Ultimately, it appears that the patient has a residual massive renal cell carcinoma that was not removed at the time of his nephrectomy one year ago.   Currently, he denies any pain, recent fever or signs of infection, or other specific complaints.     ALLERGIES: Codeine Derivatives    MEDICATIONS: Aspirin 81 MG TABS Oral  Calcium Acetate 667 MG Oral Capsule Oral  Lipitor 10 MG Oral Tablet Oral  Midodrine HCl TABS Oral  Renvela 800 MG Oral Tablet Oral     GU PSH: Perc Cryo Ablate Renal Tum - 2012 Perc Rf Ablate Renal Tumor - 2009 PLACE RT DEVICE/MARKER, PROS - 2015 Prostate Needle Biopsy - 2009      [place] Notes: Prostate Place Interstitial Dev For Radiation Guide Multiple, CABG (CABG), Percutaneous Ablation Of Renal Tumor(S) By Cryotherapy Left, Complete Colonoscopy, Eye Surgery, Varicose Vein Ligation, Biopsy Of The Prostate Needle, Ablation Of Percutaneous Renal Tumor(S) By Radiofrequency, Heart Surgery, Foot Surgery, Eye Surgery, Cholecystectomy   NON-GU PSH: Cholecystectomy - 2008 Coronary Artery Bypass Grafting (cabg) - 2013 Diagnostic Colonoscopy - 2010 Ligate/divide/excise Vein - 2009    GU PMH: Hydronephrosis Unspec, Hydronephrosis, left - 09/02/2014 Kidney Cancer Unspec, except renal pelvis, Renal cell carcinoma, unspecified laterality - 09/02/2014 Bladder Stone, Bladder calculus - 2016 Calculus Ureter, Calculus of left ureter - 2016 ED, arterial insufficiency, Erectile dysfunction due to arterial insufficiency - 2015 Disorder Kidney/ureter, Unspec, Renal insufficiency -  2014 Elevated PSA, Elevated prostate specific antigen (PSA) - 2014 End-Stage Renal Disease, End stage renal disease - 2014 Gross hematuria, Gross Hematuria - 2014 Kidney Failure Unspec, Renal Failure - 2014 Neoplasm of unspecified behavior of unspecified kidney, Renal Neoplasm - 2014 Personal Hx Oth male genital organs diseases, History of prostatitis - 2014 Prostate Cancer, Prostate cancer - 2014 Prostate, Neoplasm of uncertain behavior, Neoplasm of uncertain behavior of prostate - 2014      PMH Notes:  2006-08-02 13:46:24 - Note: Coronary Artery Disease  2006-08-02 13:46:24 - Note: Venous Thrombosis  2007-09-28 09:34:46 - Note: Cardiac Catheterization (Diagnostic)  2007-09-28 09:34:46 - Note: Kidney Cancer   NON-GU PMH: Benign neoplasm of sigmoid colon, Polyps Of The Sigmoid Colon - 2014 Legal blindness, as defined in [place], Legally Blind ([place] Definition) - 2014 Personal history of other diseases of the circulatory system, History of hypertension - 2014 Personal history of other diseases of the nervous system and sense organs, History of glaucoma - 2014 Personal history of other endocrine, nutritional and metabolic disease, History of hypercholesterolemia - 2014, History of diabetes mellitus, - 2014 Personal history of other mental and behavioral disorders, History of depression - 2014 Encounter for general adult medical examination without abnormal findings, Encounter for preventive health examination    FAMILY HISTORY: Acute Myocardial Infarction - Brother Breast Cancer - Mother Death In The Family Father - Father Death In The Family Mother - Mother Emphysema - Father Family Health Status Number - Runs In Family Kidney Cancer - Father Lung Cancer - Brother ovarian cancer - Mother   SOCIAL HISTORY: Marital Status: Married  Current Smoking Status: Patient has never smoked.  Does not drink anymore.  Does not drink caffeine.     Notes: Never A Smoker, Tobacco Use, Alcohol Use,  Marital History - Currently Married, Occupation:, Caffeine Use   REVIEW OF SYSTEMS:    GU Review Male:   Patient denies frequent urination, hard to postpone urination, burning/ pain with urination, get up at night to urinate, leakage of urine, stream starts and stops, trouble starting your streams, and have to strain to urinate .  Gastrointestinal (Lower):   Patient denies diarrhea and constipation.  Gastrointestinal (Upper):   Patient denies vomiting and nausea.  Constitutional:   Patient denies fever, night sweats, weight loss, and fatigue.  Skin:   Patient denies skin rash/ lesion and itching.  Eyes:   Patient denies blurred vision and double vision.  Ears/ Nose/ Throat:   Patient denies sore throat and sinus problems.  Hematologic/Lymphatic:   Patient reports easy bruising. Patient denies swollen glands.  Cardiovascular:   Patient denies leg swelling and chest pains.  Respiratory:   Patient denies cough and shortness of breath.  Endocrine:   Patient denies excessive thirst.  Musculoskeletal:   Patient reports joint pain. Patient denies back pain.  Neurological:   Patient denies headaches and dizziness.  Psychologic:   Patient denies depression and anxiety.     MULTI-SYSTEM PHYSICAL EXAMINATION:    Constitutional: Well-nourished. No physical deformities. Normally developed. Good grooming.  Neck: Neck symmetrical, not swollen. Normal tracheal position. He has an indwelling right internal jugular dialysis access catheter.  Respiratory: No labored breathing, no use of accessory muscles. Clear bilaterally.  Cardiovascular: Normal temperature, normal extremity pulses, no swelling, no varicosities. Regular rate and rhythm.  Lymphatic: No enlargement, no tenderness of axillae, groin, neck lymph nodes.  Skin: No paleness, no jaundice, no cyanosis. No lesion, no ulcer, no rash.  Neurologic / Psychiatric: Oriented to time, oriented to place, oriented to person. No depression, no anxiety, no  agitation.  Gastrointestinal: No mass, no tenderness, no rigidity, non obese abdomen. He has a large right subcostal incision from his prior open cholecystectomy. He has multiple laparoscopic incisions on his abdomen from his prior nephrectomy.  Eyes: Normal eyelids. His eyes of been removed from a history of pain.  Ears, Nose, Mouth, and Throat: Left ear no scars, no lesions, no masses. Right ear no scars, no lesions, no masses. Nose no scars, no lesions, no masses. Normal hearing. Normal lips.  Musculoskeletal: Normal gait and station of head and neck.       ASSESSMENT:      ICD-10 Details  2 GU:   Kidney Cancer Unspec, except renal pelvis - C64.9 Stable  1 NON-GU:   Right upper quadrant abdominal swelling, mass and lump - R19.01    PLAN:      1. Right retroperitoneal mass confirmed as residual renal cell carcinoma: I have had a long and detailed discussion with [name] and his wife regarding his complex history and options for ongoing treatment. It appears that he has a residual mass confirmed to be renal cell carcinoma that was not removed at the time of his nephrectomy last year. Fortunately, he does not appear to have any evidence of other sites of disease indicating this as an isolated area. We therefore have discussed options for management including ongoing observation versus treatment with ablation, systemic therapy, or surgery. He is not a very good candidate for further ablation considering close proximity other intra-abdominal structures including bowel. He understands systemic  therapy would not likely be curative. We therefore primarily focused our discussion on surgical resection of this mass.   He understands our goal would be curative although he will remain at risk for recurrence and need ongoing follow-up. He understands that if he remains disease free for over 2 years, he may potentially be able to get back on the transplant list. For these reasons, he has elected to proceed  with surgical treatment.   We have discussed this procedure in detail. I recommended that he undergo an open exploratory laparotomy with resection of this mass. We have discussed the expected postoperative recovery process including the need for perioperative dialysis. Considering that he receives hemodialysis on Monday, Wednesday, and Friday, our plan would be to perform surgery on a Thursday rather than a Monday. I will notify nephrology upon his admission so that he can receive inpatient dialysis during his hospitalization.    We discussed the potential risks of this procedure including but not limited to bleeding, infection, major cardiopulmonary risks such as heart attack, stroke, death, etc. We have discussed the risks of injury or damage to adjacent structures including the bowel, liver, lung, major vascular/neuro structures in the vicinity of his mass. We discussed the risk of recurrence which might be significant. We reviewed the postoperative recovery process. We discussed the potential increased risk of complications such as bowel injury considering his very complex prior surgical history including previous nephrectomy and previous severe abscess/infection in this area. He expresses understanding and accepts these increased risks and wishes to proceed.

## 2016-04-07 ENCOUNTER — Inpatient Hospital Stay (HOSPITAL_COMMUNITY): Payer: Medicare Other | Admitting: Anesthesiology

## 2016-04-07 ENCOUNTER — Inpatient Hospital Stay (HOSPITAL_COMMUNITY)
Admission: RE | Admit: 2016-04-07 | Discharge: 2016-04-13 | DRG: 673 | Disposition: A | Discharge status: Home / Self Care | Payer: Medicare Other | Source: Ambulatory Visit | Attending: Urology | Admitting: Urology

## 2016-04-07 ENCOUNTER — Encounter (HOSPITAL_COMMUNITY): Payer: Self-pay | Admitting: *Deleted

## 2016-04-07 ENCOUNTER — Encounter (HOSPITAL_COMMUNITY)
Admission: RE | Disposition: A | Discharge status: Home / Self Care | Payer: Self-pay | Source: Ambulatory Visit | Attending: Urology

## 2016-04-07 DIAGNOSIS — Z992 Dependence on renal dialysis: Secondary | ICD-10-CM

## 2016-04-07 DIAGNOSIS — K9189 Other postprocedural complications and disorders of digestive system: Secondary | ICD-10-CM | POA: Diagnosis not present

## 2016-04-07 DIAGNOSIS — R578 Other shock: Secondary | ICD-10-CM | POA: Diagnosis present

## 2016-04-07 DIAGNOSIS — R579 Shock, unspecified: Secondary | ICD-10-CM | POA: Diagnosis not present

## 2016-04-07 DIAGNOSIS — E1139 Type 2 diabetes mellitus with other diabetic ophthalmic complication: Secondary | ICD-10-CM | POA: Diagnosis present

## 2016-04-07 DIAGNOSIS — D638 Anemia in other chronic diseases classified elsewhere: Secondary | ICD-10-CM | POA: Diagnosis present

## 2016-04-07 DIAGNOSIS — J811 Chronic pulmonary edema: Secondary | ICD-10-CM | POA: Diagnosis not present

## 2016-04-07 DIAGNOSIS — R1901 Right upper quadrant abdominal swelling, mass and lump: Secondary | ICD-10-CM | POA: Diagnosis present

## 2016-04-07 DIAGNOSIS — D539 Nutritional anemia, unspecified: Secondary | ICD-10-CM

## 2016-04-07 DIAGNOSIS — Z923 Personal history of irradiation: Secondary | ICD-10-CM | POA: Diagnosis not present

## 2016-04-07 DIAGNOSIS — R197 Diarrhea, unspecified: Secondary | ICD-10-CM

## 2016-04-07 DIAGNOSIS — I2510 Atherosclerotic heart disease of native coronary artery without angina pectoris: Secondary | ICD-10-CM | POA: Diagnosis not present

## 2016-04-07 DIAGNOSIS — C642 Malignant neoplasm of left kidney, except renal pelvis: Secondary | ICD-10-CM | POA: Diagnosis not present

## 2016-04-07 DIAGNOSIS — H547 Unspecified visual loss: Secondary | ICD-10-CM

## 2016-04-07 DIAGNOSIS — Z801 Family history of malignant neoplasm of trachea, bronchus and lung: Secondary | ICD-10-CM

## 2016-04-07 DIAGNOSIS — Z951 Presence of aortocoronary bypass graft: Secondary | ICD-10-CM

## 2016-04-07 DIAGNOSIS — E1129 Type 2 diabetes mellitus with other diabetic kidney complication: Secondary | ICD-10-CM | POA: Diagnosis not present

## 2016-04-07 DIAGNOSIS — Z825 Family history of asthma and other chronic lower respiratory diseases: Secondary | ICD-10-CM | POA: Diagnosis not present

## 2016-04-07 DIAGNOSIS — Z8249 Family history of ischemic heart disease and other diseases of the circulatory system: Secondary | ICD-10-CM | POA: Diagnosis not present

## 2016-04-07 DIAGNOSIS — Z8051 Family history of malignant neoplasm of kidney: Secondary | ICD-10-CM | POA: Diagnosis not present

## 2016-04-07 DIAGNOSIS — I120 Hypertensive chronic kidney disease with stage 5 chronic kidney disease or end stage renal disease: Secondary | ICD-10-CM | POA: Diagnosis not present

## 2016-04-07 DIAGNOSIS — I9589 Other hypotension: Secondary | ICD-10-CM | POA: Diagnosis not present

## 2016-04-07 DIAGNOSIS — D509 Iron deficiency anemia, unspecified: Secondary | ICD-10-CM | POA: Diagnosis not present

## 2016-04-07 DIAGNOSIS — C228 Malignant neoplasm of liver, primary, unspecified as to type: Secondary | ICD-10-CM | POA: Diagnosis not present

## 2016-04-07 DIAGNOSIS — Z8701 Personal history of pneumonia (recurrent): Secondary | ICD-10-CM

## 2016-04-07 DIAGNOSIS — K660 Peritoneal adhesions (postprocedural) (postinfection): Secondary | ICD-10-CM | POA: Diagnosis not present

## 2016-04-07 DIAGNOSIS — I9581 Postprocedural hypotension: Secondary | ICD-10-CM | POA: Diagnosis not present

## 2016-04-07 DIAGNOSIS — C649 Malignant neoplasm of unspecified kidney, except renal pelvis: Secondary | ICD-10-CM | POA: Diagnosis not present

## 2016-04-07 DIAGNOSIS — R011 Cardiac murmur, unspecified: Secondary | ICD-10-CM | POA: Diagnosis not present

## 2016-04-07 DIAGNOSIS — I959 Hypotension, unspecified: Secondary | ICD-10-CM | POA: Diagnosis present

## 2016-04-07 DIAGNOSIS — E1122 Type 2 diabetes mellitus with diabetic chronic kidney disease: Secondary | ICD-10-CM | POA: Diagnosis not present

## 2016-04-07 DIAGNOSIS — C641 Malignant neoplasm of right kidney, except renal pelvis: Principal | ICD-10-CM | POA: Diagnosis present

## 2016-04-07 DIAGNOSIS — E46 Unspecified protein-calorie malnutrition: Secondary | ICD-10-CM | POA: Diagnosis present

## 2016-04-07 DIAGNOSIS — Z87442 Personal history of urinary calculi: Secondary | ICD-10-CM | POA: Diagnosis not present

## 2016-04-07 DIAGNOSIS — E1140 Type 2 diabetes mellitus with diabetic neuropathy, unspecified: Secondary | ICD-10-CM | POA: Diagnosis not present

## 2016-04-07 DIAGNOSIS — E782 Mixed hyperlipidemia: Secondary | ICD-10-CM | POA: Diagnosis not present

## 2016-04-07 DIAGNOSIS — Z8546 Personal history of malignant neoplasm of prostate: Secondary | ICD-10-CM | POA: Diagnosis not present

## 2016-04-07 DIAGNOSIS — N2581 Secondary hyperparathyroidism of renal origin: Secondary | ICD-10-CM | POA: Diagnosis not present

## 2016-04-07 DIAGNOSIS — Z86010 Personal history of colonic polyps: Secondary | ICD-10-CM

## 2016-04-07 DIAGNOSIS — K651 Peritoneal abscess: Secondary | ICD-10-CM | POA: Diagnosis present

## 2016-04-07 DIAGNOSIS — Z803 Family history of malignant neoplasm of breast: Secondary | ICD-10-CM

## 2016-04-07 DIAGNOSIS — A0472 Enterocolitis due to Clostridium difficile, not specified as recurrent: Secondary | ICD-10-CM | POA: Diagnosis not present

## 2016-04-07 DIAGNOSIS — D631 Anemia in chronic kidney disease: Secondary | ICD-10-CM | POA: Diagnosis not present

## 2016-04-07 DIAGNOSIS — Z833 Family history of diabetes mellitus: Secondary | ICD-10-CM

## 2016-04-07 DIAGNOSIS — C480 Malignant neoplasm of retroperitoneum: Secondary | ICD-10-CM | POA: Diagnosis not present

## 2016-04-07 DIAGNOSIS — Z9889 Other specified postprocedural states: Secondary | ICD-10-CM | POA: Diagnosis not present

## 2016-04-07 DIAGNOSIS — N186 End stage renal disease: Secondary | ICD-10-CM | POA: Diagnosis not present

## 2016-04-07 DIAGNOSIS — M6281 Muscle weakness (generalized): Secondary | ICD-10-CM

## 2016-04-07 DIAGNOSIS — Z905 Acquired absence of kidney: Secondary | ICD-10-CM

## 2016-04-07 HISTORY — PX: PARTIAL COLECTOMY: SHX5273

## 2016-04-07 HISTORY — PX: LAPAROTOMY: SHX154

## 2016-04-07 LAB — TYPE AND SCREEN
ABO/RH(D): A POS
Antibody Screen: NEGATIVE

## 2016-04-07 LAB — CBC
HCT: 33.1 % — ABNORMAL LOW (ref 39.0–52.0)
Hemoglobin: 11.4 g/dL — ABNORMAL LOW (ref 13.0–17.0)
MCH: 35 pg — AB (ref 26.0–34.0)
MCHC: 34.4 g/dL (ref 30.0–36.0)
MCV: 101.5 fL — AB (ref 78.0–100.0)
PLATELETS: 112 10*3/uL — AB (ref 150–400)
RBC: 3.26 MIL/uL — ABNORMAL LOW (ref 4.22–5.81)
RDW: 14.3 % (ref 11.5–15.5)
WBC: 3.6 10*3/uL — ABNORMAL LOW (ref 4.0–10.5)

## 2016-04-07 LAB — BASIC METABOLIC PANEL
ANION GAP: 16 — AB (ref 5–15)
Anion gap: 12 (ref 5–15)
Anion gap: 16 — ABNORMAL HIGH (ref 5–15)
BUN: 44 mg/dL — AB (ref 6–20)
BUN: 48 mg/dL — ABNORMAL HIGH (ref 6–20)
BUN: 53 mg/dL — ABNORMAL HIGH (ref 6–20)
CALCIUM: 9.6 mg/dL (ref 8.9–10.3)
CALCIUM: 9.1 mg/dL (ref 8.9–10.3)
CHLORIDE: 92 mmol/L — AB (ref 101–111)
CHLORIDE: 94 mmol/L — AB (ref 101–111)
CHLORIDE: 96 mmol/L — AB (ref 101–111)
CO2: 30 mmol/L (ref 22–32)
CO2: 22 mmol/L (ref 22–32)
CO2: 23 mmol/L (ref 22–32)
CREATININE: 4.36 mg/dL — AB (ref 0.61–1.24)
CREATININE: 5.14 mg/dL — AB (ref 0.61–1.24)
Calcium: 8.9 mg/dL (ref 8.9–10.3)
Creatinine, Ser: 4.7 mg/dL — ABNORMAL HIGH (ref 0.61–1.24)
GFR calc Af Amer: 15 mL/min — ABNORMAL LOW (ref >60)
GFR calc non Af Amer: 13 mL/min — ABNORMAL LOW (ref >60)
GFR calc non Af Amer: 12 mL/min — ABNORMAL LOW (ref >60)
GFR calc non Af Amer: 11 mL/min — ABNORMAL LOW (ref >60)
GFR, EST AFRICAN AMERICAN: 14 mL/min — AB (ref >60)
GFR, EST AFRICAN AMERICAN: 12 mL/min — AB (ref >60)
GLUCOSE: 189 mg/dL — AB (ref 65–99)
Glucose, Bld: 90 mg/dL (ref 65–99)
Glucose, Bld: 156 mg/dL — ABNORMAL HIGH (ref 65–99)
Potassium: 4.4 mmol/L (ref 3.5–5.1)
Potassium: 4 mmol/L (ref 3.5–5.1)
Potassium: 4.4 mmol/L (ref 3.5–5.1)
Sodium: 134 mmol/L — ABNORMAL LOW (ref 135–145)
Sodium: 132 mmol/L — ABNORMAL LOW (ref 135–145)
Sodium: 135 mmol/L (ref 135–145)

## 2016-04-07 LAB — CBC WITH DIFFERENTIAL/PLATELET
BASOS PCT: 0 %
Basophils Absolute: 0 10*3/uL (ref 0.0–0.1)
EOS PCT: 0 %
Eosinophils Absolute: 0 10*3/uL (ref 0.0–0.7)
HEMATOCRIT: 30.2 % — AB (ref 39.0–52.0)
HEMOGLOBIN: 10.4 g/dL — AB (ref 13.0–17.0)
LYMPHS PCT: 5 %
Lymphs Abs: 0.5 10*3/uL — ABNORMAL LOW (ref 0.7–4.0)
MCH: 35 pg — AB (ref 26.0–34.0)
MCHC: 34.4 g/dL (ref 30.0–36.0)
MCV: 101.7 fL — AB (ref 78.0–100.0)
MONOS PCT: 4 %
Monocytes Absolute: 0.4 10*3/uL (ref 0.1–1.0)
NEUTROS ABS: 9.4 10*3/uL — AB (ref 1.7–7.7)
Neutrophils Relative %: 91 %
Platelets: 116 10*3/uL — ABNORMAL LOW (ref 150–400)
RBC: 2.97 MIL/uL — ABNORMAL LOW (ref 4.22–5.81)
RDW: 15 % (ref 11.5–15.5)
WBC: 10.3 10*3/uL (ref 4.0–10.5)

## 2016-04-07 LAB — GLUCOSE, CAPILLARY
GLUCOSE-CAPILLARY: 128 mg/dL — AB (ref 65–99)
GLUCOSE-CAPILLARY: 146 mg/dL — AB (ref 65–99)
GLUCOSE-CAPILLARY: 156 mg/dL — AB (ref 65–99)
GLUCOSE-CAPILLARY: 167 mg/dL — AB (ref 65–99)
GLUCOSE-CAPILLARY: 86 mg/dL (ref 65–99)

## 2016-04-07 LAB — HEMOGLOBIN AND HEMATOCRIT, BLOOD
HEMATOCRIT: 33.7 % — AB (ref 39.0–52.0)
Hemoglobin: 11.1 g/dL — ABNORMAL LOW (ref 13.0–17.0)

## 2016-04-07 LAB — LACTIC ACID, PLASMA: Lactic Acid, Venous: 2 mmol/L (ref 0.5–1.9)

## 2016-04-07 LAB — TROPONIN I: Troponin I: <0.03 ng/mL (ref <0.03)

## 2016-04-07 SURGERY — LAPAROTOMY, EXPLORATORY
Anesthesia: General | Laterality: Right

## 2016-04-07 MED ORDER — DIPHENHYDRAMINE HCL 50 MG/ML IJ SOLN: 12.5000 mg | Freq: Four times a day (QID) | INTRAMUSCULAR | Status: DC | PRN

## 2016-04-07 MED ORDER — PROPOFOL 10 MG/ML IV BOLUS
INTRAVENOUS | Status: DC | PRN
  Administered 2016-04-07: 100 mg via INTRAVENOUS

## 2016-04-07 MED ORDER — PHENYLEPHRINE HCL 10 MG/ML IJ SOLN
0.0000 ug/min | INTRAVENOUS | Status: DC
  Administered 2016-04-07: 20 ug/min via INTRAVENOUS
  Filled 2016-04-07: qty 4

## 2016-04-07 MED ORDER — LIDOCAINE HCL (CARDIAC) 20 MG/ML IV SOLN
INTRAVENOUS | Status: DC | PRN
  Administered 2016-04-07: 60 mg via INTRAVENOUS

## 2016-04-07 MED ORDER — ATORVASTATIN CALCIUM 10 MG PO TABS
10.0000 mg | ORAL_TABLET | Freq: Every day | ORAL | Status: DC
  Administered 2016-04-08 – 2016-04-13 (×5): 10 mg via ORAL
  Filled 2016-04-07 (×5): qty 1

## 2016-04-07 MED ORDER — CISATRACURIUM BESYLATE (PF) 10 MG/5ML IV SOLN
INTRAVENOUS | Status: DC | PRN
  Administered 2016-04-07: 10 mg via INTRAVENOUS
  Administered 2016-04-07 (×3): 4 mg via INTRAVENOUS
  Administered 2016-04-07 (×2): 2 mg via INTRAVENOUS
  Administered 2016-04-07 (×4): 4 mg via INTRAVENOUS

## 2016-04-07 MED ORDER — PROMETHAZINE HCL 25 MG/ML IJ SOLN
6.2500 mg | INTRAMUSCULAR | Status: DC | PRN
  Administered 2016-04-07: 6.25 mg via INTRAVENOUS

## 2016-04-07 MED ORDER — GLYCOPYRROLATE 0.2 MG/ML IV SOSY
PREFILLED_SYRINGE | INTRAVENOUS | Status: AC
  Filled 2016-04-07: qty 3

## 2016-04-07 MED ORDER — SEVELAMER CARBONATE 800 MG PO TABS
4800.0000 mg | ORAL_TABLET | Freq: Three times a day (TID) | ORAL | Status: DC
  Administered 2016-04-11 – 2016-04-12 (×5): 4800 mg via ORAL
  Filled 2016-04-07 (×13): qty 6

## 2016-04-07 MED ORDER — FENTANYL CITRATE (PF) 100 MCG/2ML IJ SOLN
25.0000 ug | INTRAMUSCULAR | Status: DC | PRN
  Administered 2016-04-07 – 2016-04-09 (×8): 50 ug via INTRAVENOUS
  Administered 2016-04-10 (×3): 100 ug via INTRAVENOUS
  Filled 2016-04-07 (×11): qty 2

## 2016-04-07 MED ORDER — ONDANSETRON HCL 4 MG/2ML IJ SOLN
INTRAMUSCULAR | Status: AC
  Filled 2016-04-07: qty 2

## 2016-04-07 MED ORDER — DEXAMETHASONE SODIUM PHOSPHATE 10 MG/ML IJ SOLN
INTRAMUSCULAR | Status: DC | PRN
  Administered 2016-04-07: 10 mg via INTRAVENOUS

## 2016-04-07 MED ORDER — HYDROMORPHONE HCL 1 MG/ML IJ SOLN
INTRAMUSCULAR | Status: AC
  Administered 2016-04-07: 0.25 mg via INTRAVENOUS
  Filled 2016-04-07: qty 1

## 2016-04-07 MED ORDER — DEXTROSE 5 % IV SOLN
1.0000 g | INTRAVENOUS | Status: DC
  Administered 2016-04-07 – 2016-04-10 (×4): 1 g via INTRAVENOUS
  Filled 2016-04-07 (×5): qty 1

## 2016-04-07 MED ORDER — SODIUM CHLORIDE 0.9 % IV BOLUS (SEPSIS)
500.0000 mL | Freq: Once | INTRAVENOUS | Status: AC
  Administered 2016-04-07: 500 mL via INTRAVENOUS

## 2016-04-07 MED ORDER — CISATRACURIUM BESYLATE 20 MG/10ML IV SOLN
INTRAVENOUS | Status: AC
  Filled 2016-04-07: qty 10

## 2016-04-07 MED ORDER — FENTANYL CITRATE (PF) 100 MCG/2ML IJ SOLN
INTRAMUSCULAR | Status: DC | PRN
  Administered 2016-04-07 (×2): 25 ug via INTRAVENOUS
  Administered 2016-04-07: 50 ug via INTRAVENOUS

## 2016-04-07 MED ORDER — SODIUM CHLORIDE 0.9 % IJ SOLN
INTRAMUSCULAR | Status: AC
  Filled 2016-04-07: qty 10

## 2016-04-07 MED ORDER — FENTANYL CITRATE (PF) 100 MCG/2ML IJ SOLN
INTRAMUSCULAR | Status: AC
  Filled 2016-04-07: qty 2

## 2016-04-07 MED ORDER — PROMETHAZINE HCL 25 MG/ML IJ SOLN
INTRAMUSCULAR | Status: AC
  Administered 2016-04-07: 6.25 mg via INTRAVENOUS
  Filled 2016-04-07: qty 1

## 2016-04-07 MED ORDER — NEOSTIGMINE METHYLSULFATE 5 MG/5ML IV SOSY
PREFILLED_SYRINGE | INTRAVENOUS | Status: DC | PRN
  Administered 2016-04-07: 3.5 mg via INTRAVENOUS

## 2016-04-07 MED ORDER — SODIUM CHLORIDE 0.9 % IV SOLN
INTRAVENOUS | Status: DC | PRN
  Administered 2016-04-07: 07:00:00 via INTRAVENOUS

## 2016-04-07 MED ORDER — DIPHENHYDRAMINE HCL 12.5 MG/5ML PO ELIX: 12.5000 mg | ORAL_SOLUTION | Freq: Four times a day (QID) | ORAL | Status: DC | PRN

## 2016-04-07 MED ORDER — SODIUM CHLORIDE 0.9 % IJ SOLN
INTRAMUSCULAR | Status: DC | PRN
  Administered 2016-04-07: 20 mL

## 2016-04-07 MED ORDER — PHENYLEPHRINE HCL 10 MG/ML IJ SOLN
INTRAVENOUS | Status: DC | PRN
  Administered 2016-04-07: 10 ug/min via INTRAVENOUS

## 2016-04-07 MED ORDER — HYDROMORPHONE HCL 1 MG/ML IJ SOLN
0.2500 mg | INTRAMUSCULAR | Status: DC | PRN
  Administered 2016-04-07 (×4): 0.25 mg via INTRAVENOUS

## 2016-04-07 MED ORDER — ONDANSETRON HCL 4 MG/2ML IJ SOLN: 4.0000 mg | Freq: Four times a day (QID) | INTRAMUSCULAR | Status: DC | PRN

## 2016-04-07 MED ORDER — CEFAZOLIN SODIUM-DEXTROSE 2-4 GM/100ML-% IV SOLN
2.0000 g | INTRAVENOUS | Status: AC
  Administered 2016-04-07: 2 g via INTRAVENOUS

## 2016-04-07 MED ORDER — PHENYLEPHRINE HCL 10 MG/ML IJ SOLN
INTRAMUSCULAR | Status: AC
  Filled 2016-04-07: qty 2

## 2016-04-07 MED ORDER — PHENYLEPHRINE 40 MCG/ML (10ML) SYRINGE FOR IV PUSH (FOR BLOOD PRESSURE SUPPORT)
PREFILLED_SYRINGE | INTRAVENOUS | Status: AC
  Filled 2016-04-07: qty 10

## 2016-04-07 MED ORDER — FENTANYL 40 MCG/ML IV SOLN
INTRAVENOUS | Status: DC
  Administered 2016-04-07: 13:00:00 via INTRAVENOUS
  Filled 2016-04-07: qty 25

## 2016-04-07 MED ORDER — CINACALCET HCL 30 MG PO TABS
90.0000 mg | ORAL_TABLET | Freq: Every day | ORAL | Status: DC
  Administered 2016-04-10 – 2016-04-13 (×4): 90 mg via ORAL
  Filled 2016-04-07 (×7): qty 3

## 2016-04-07 MED ORDER — SODIUM CHLORIDE 0.9 % IJ SOLN
INTRAMUSCULAR | Status: AC
  Filled 2016-04-07: qty 20

## 2016-04-07 MED ORDER — SODIUM CHLORIDE 0.9% FLUSH: 9.0000 mL | INTRAVENOUS | Status: DC | PRN

## 2016-04-07 MED ORDER — MIDAZOLAM HCL 5 MG/5ML IJ SOLN
INTRAMUSCULAR | Status: DC | PRN
  Administered 2016-04-07: 1 mg via INTRAVENOUS

## 2016-04-07 MED ORDER — CINACALCET HCL 30 MG PO TABS: 90.0000 mg | ORAL_TABLET | Freq: Every day | ORAL | Status: DC

## 2016-04-07 MED ORDER — NALOXONE HCL 0.4 MG/ML IJ SOLN: 0.4000 mg | INTRAMUSCULAR | Status: DC | PRN

## 2016-04-07 MED ORDER — PHENYLEPHRINE HCL 10 MG/ML IJ SOLN
0.0000 ug/min | INTRAVENOUS | Status: DC
  Administered 2016-04-07: 13.33 ug/min via INTRAVENOUS
  Filled 2016-04-07: qty 1

## 2016-04-07 MED ORDER — MIDODRINE HCL 5 MG PO TABS
10.0000 mg | ORAL_TABLET | Freq: Three times a day (TID) | ORAL | Status: DC
  Administered 2016-04-08 – 2016-04-12 (×13): 10 mg via ORAL
  Filled 2016-04-07 (×13): qty 2

## 2016-04-07 MED ORDER — HYDROMORPHONE HCL 1 MG/ML IJ SOLN: 0.5000 mg | INTRAMUSCULAR | Status: DC | PRN

## 2016-04-07 MED ORDER — SEVELAMER CARBONATE 800 MG PO TABS: 2400.0000 mg | ORAL_TABLET | ORAL | Status: DC

## 2016-04-07 MED ORDER — ONDANSETRON HCL 4 MG/2ML IJ SOLN
4.0000 mg | INTRAMUSCULAR | Status: DC | PRN
  Administered 2016-04-07 – 2016-04-11 (×3): 4 mg via INTRAVENOUS
  Filled 2016-04-07 (×3): qty 2

## 2016-04-07 MED ORDER — LIDOCAINE 2% (20 MG/ML) 5 ML SYRINGE
INTRAMUSCULAR | Status: AC
  Filled 2016-04-07: qty 5

## 2016-04-07 MED ORDER — SODIUM CHLORIDE 0.9 % IV SOLN
INTRAVENOUS | Status: DC
  Administered 2016-04-07 (×2): via INTRAVENOUS

## 2016-04-07 MED ORDER — SEVELAMER CARBONATE 800 MG PO TABS
2400.0000 mg | ORAL_TABLET | Freq: Two times a day (BID) | ORAL | Status: DC | PRN
  Administered 2016-04-13: 2400 mg via ORAL
  Filled 2016-04-07: qty 3

## 2016-04-07 MED ORDER — ALBUMIN HUMAN 5 % IV SOLN
INTRAVENOUS | Status: AC
  Filled 2016-04-07: qty 250

## 2016-04-07 MED ORDER — PHENYLEPHRINE HCL 10 MG/ML IJ SOLN
INTRAMUSCULAR | Status: AC
  Filled 2016-04-07: qty 1

## 2016-04-07 MED ORDER — NEOSTIGMINE METHYLSULFATE 5 MG/5ML IV SOSY
PREFILLED_SYRINGE | INTRAVENOUS | Status: AC
  Filled 2016-04-07: qty 5

## 2016-04-07 MED ORDER — INSULIN ASPART 100 UNIT/ML ~~LOC~~ SOLN
0.0000 [IU] | SUBCUTANEOUS | Status: DC
  Administered 2016-04-07 (×2): 2 [IU] via SUBCUTANEOUS
  Administered 2016-04-07: 3 [IU] via SUBCUTANEOUS
  Administered 2016-04-10 – 2016-04-11 (×4): 2 [IU] via SUBCUTANEOUS
  Administered 2016-04-12: 1 [IU] via SUBCUTANEOUS
  Administered 2016-04-12: 3 [IU] via SUBCUTANEOUS

## 2016-04-07 MED ORDER — HYDROCODONE-ACETAMINOPHEN 5-325 MG PO TABS: 1.0000 | ORAL_TABLET | Freq: Four times a day (QID) | ORAL | 0 refills | Status: DC | PRN

## 2016-04-07 MED ORDER — CALCITRIOL 0.25 MCG PO CAPS
0.2500 ug | ORAL_CAPSULE | ORAL | Status: DC
  Administered 2016-04-08 – 2016-04-11 (×2): 0.25 ug via ORAL
  Filled 2016-04-07 (×2): qty 1

## 2016-04-07 MED ORDER — MIDODRINE HCL 5 MG PO TABS: 10.0000 mg | ORAL_TABLET | Freq: Three times a day (TID) | ORAL | Status: DC

## 2016-04-07 MED ORDER — 0.9 % SODIUM CHLORIDE (POUR BTL) OPTIME
TOPICAL | Status: DC | PRN
  Administered 2016-04-07: 2000 mL

## 2016-04-07 MED ORDER — CEFAZOLIN SODIUM-DEXTROSE 2-4 GM/100ML-% IV SOLN
INTRAVENOUS | Status: AC
  Filled 2016-04-07: qty 100

## 2016-04-07 MED ORDER — DEXTROSE 5 % IV SOLN: 1.0000 g | Freq: Three times a day (TID) | INTRAVENOUS | Status: DC

## 2016-04-07 MED ORDER — MIDAZOLAM HCL 2 MG/2ML IJ SOLN
INTRAMUSCULAR | Status: AC
  Filled 2016-04-07: qty 2

## 2016-04-07 MED ORDER — DEXAMETHASONE SODIUM PHOSPHATE 10 MG/ML IJ SOLN
INTRAMUSCULAR | Status: AC
  Filled 2016-04-07: qty 1

## 2016-04-07 MED ORDER — ONDANSETRON HCL 4 MG/2ML IJ SOLN
INTRAMUSCULAR | Status: DC | PRN
  Administered 2016-04-07: 4 mg via INTRAVENOUS

## 2016-04-07 MED ORDER — ALBUMIN HUMAN 5 % IV SOLN
INTRAVENOUS | Status: DC | PRN
  Administered 2016-04-07: 11:00:00 via INTRAVENOUS

## 2016-04-07 MED ORDER — PROPOFOL 10 MG/ML IV BOLUS
INTRAVENOUS | Status: AC
  Filled 2016-04-07: qty 20

## 2016-04-07 MED ORDER — PHENYLEPHRINE HCL 10 MG/ML IJ SOLN
INTRAMUSCULAR | Status: DC | PRN
  Administered 2016-04-07 (×5): 80 ug via INTRAVENOUS

## 2016-04-07 MED ORDER — BUPIVACAINE LIPOSOME 1.3 % IJ SUSP
20.0000 mL | Freq: Once | INTRAMUSCULAR | Status: AC
  Administered 2016-04-07: 20 mL
  Filled 2016-04-07: qty 20

## 2016-04-07 MED ORDER — GLYCOPYRROLATE 0.2 MG/ML IV SOSY
PREFILLED_SYRINGE | INTRAVENOUS | Status: DC | PRN
  Administered 2016-04-07: 0.4 mg via INTRAVENOUS

## 2016-04-07 SURGICAL SUPPLY — 65 items
BAG URINE DRAINAGE (UROLOGICAL SUPPLIES) ×2 IMPLANT
BLADE EXTENDED COATED 6.5IN (ELECTRODE) ×4 IMPLANT
BLADE HEX COATED 2.75 (ELECTRODE) ×2 IMPLANT
CATH FOLEY 2WAY SLVR  5CC 20FR (CATHETERS) ×2
CATH FOLEY 2WAY SLVR 30CC 20FR (CATHETERS) ×2 IMPLANT
CATH FOLEY 2WAY SLVR 5CC 20FR (CATHETERS) ×2 IMPLANT
CLIP LIGATING HEM O LOK PURPLE (MISCELLANEOUS) ×10 IMPLANT
CLIP LIGATING HEMO O LOK GREEN (MISCELLANEOUS) ×10 IMPLANT
CLIP TI LARGE 6 (CLIP) IMPLANT
COUNTER NEEDLE 20 DBL MAG RED (NEEDLE) ×2 IMPLANT
COVER BACK TABLE 60X90IN (DRAPES) ×2 IMPLANT
COVER MAYO STAND STRL (DRAPES) ×2 IMPLANT
COVER SURGICAL LIGHT HANDLE (MISCELLANEOUS) ×2 IMPLANT
DISSECTOR ROUND CHERRY 3/8 STR (MISCELLANEOUS) ×4 IMPLANT
DRAIN CHANNEL RND F F (WOUND CARE) ×4 IMPLANT
DRAPE HALF SHEET 40X57 (DRAPES) ×4 IMPLANT
DRAPE INCISE IOBAN 66X45 STRL (DRAPES) ×2 IMPLANT
DRAPE LAPAROTOMY T 102X78X121 (DRAPES) ×4 IMPLANT
DRAPE WARM FLUID 44X44 (DRAPE) ×4 IMPLANT
DRSG OPSITE POSTOP 4X12 (GAUZE/BANDAGES/DRESSINGS) ×2 IMPLANT
ELECT REM PT RETURN 9FT ADLT (ELECTROSURGICAL) ×4
ELECTRODE REM PT RTRN 9FT ADLT (ELECTROSURGICAL) ×2 IMPLANT
EVACUATOR SILICONE 100CC (DRAIN) ×4 IMPLANT
GAUZE SPONGE 4X4 12PLY STRL (GAUZE/BANDAGES/DRESSINGS) ×2 IMPLANT
GAUZE SPONGE 4X4 16PLY XRAY LF (GAUZE/BANDAGES/DRESSINGS) ×6 IMPLANT
GLOVE BIOGEL M STRL SZ7.5 (GLOVE) ×4 IMPLANT
GOWN STRL REUS W/TWL LRG LVL3 (GOWN DISPOSABLE) ×4 IMPLANT
HEMOSTAT SURGICEL 4X8 (HEMOSTASIS) IMPLANT
KIT BASIN OR (CUSTOM PROCEDURE TRAY) ×4 IMPLANT
LIGASURE IMPACT 36 18CM CVD LR (INSTRUMENTS) ×2 IMPLANT
NS IRRIG 1000ML POUR BTL (IV SOLUTION) ×8 IMPLANT
PACK GENERAL/GYN (CUSTOM PROCEDURE TRAY) ×6 IMPLANT
PLUG CATH AND CAP STER (CATHETERS) ×2 IMPLANT
RELOAD PROXIMATE 75MM BLUE (ENDOMECHANICALS) ×12 IMPLANT
RELOAD STAPLE 75 3.8 BLU REG (ENDOMECHANICALS) IMPLANT
SLEEVE SURGEON STRL (DRAPES) ×2 IMPLANT
SPONGE LAP 18X18 X RAY DECT (DISPOSABLE) ×10 IMPLANT
SPONGE LAP 4X18 X RAY DECT (DISPOSABLE) ×2 IMPLANT
STAPLER GUN LINEAR PROX 60 (STAPLE) ×2 IMPLANT
STAPLER PROXIMATE 75MM BLUE (STAPLE) ×2 IMPLANT
STAPLER VISISTAT 35W (STAPLE) ×4 IMPLANT
SURGIFLO W/THROMBIN 8M KIT (HEMOSTASIS) ×2 IMPLANT
SUT CHROMIC 3 0 SH 27 (SUTURE) ×4 IMPLANT
SUT ETHILON 2 0 PS N (SUTURE) ×2 IMPLANT
SUT PDS AB 0 CTX 60 (SUTURE) ×8 IMPLANT
SUT SILK 0 (SUTURE) ×4
SUT SILK 0 30XBRD TIE 6 (SUTURE) ×4 IMPLANT
SUT SILK 2 0 30  PSL (SUTURE)
SUT SILK 2 0 30 PSL (SUTURE) ×2 IMPLANT
SUT SILK 2 0 SH (SUTURE) ×4 IMPLANT
SUT SILK 2 0 SH CR/8 (SUTURE) ×2 IMPLANT
SUT SILK 3 0 SH CR/8 (SUTURE) ×2 IMPLANT
SUT VIC AB 0 UR5 27 (SUTURE) ×2 IMPLANT
SUT VIC AB 1 CT1 27 (SUTURE) ×4
SUT VIC AB 1 CT1 27XBRD ANTBC (SUTURE) ×2 IMPLANT
SUT VIC AB 2-0 SH 27 (SUTURE) ×8
SUT VIC AB 2-0 SH 27X BRD (SUTURE) ×2 IMPLANT
SUT VIC AB 2-0 UR5 27 (SUTURE) IMPLANT
SWAB COLLECTION DEVICE MRSA (MISCELLANEOUS) ×2 IMPLANT
SWAB CULTURE ESWAB REG 1ML (MISCELLANEOUS) ×2 IMPLANT
SYR 30ML LL (SYRINGE) ×2 IMPLANT
TAPE CLOTH SOFT 2X10 (GAUZE/BANDAGES/DRESSINGS) ×2 IMPLANT
TAPE UMBILICAL COTTON 1/8X30 (MISCELLANEOUS) IMPLANT
TOWEL OR 17X26 10 PK STRL BLUE (TOWEL DISPOSABLE) ×4 IMPLANT
WATER STERILE IRR 1500ML POUR (IV SOLUTION) ×2 IMPLANT

## 2016-04-07 NOTE — Op Note (Signed)
Preoperative diagnosis:  1. Renal cell carcinoma / retroperitoneal mass  Postoperative diagnosis: 1. Renal cell carcinoma / retroperitoneal mass  Procedure(s): 1. Exploratory laparotomy 2. Resection of retroperitoneal mass 3. Right colectomy 4. Liver resection  Co-surgeons: Dr. Raynelle Bring, Dr. Leighton Ruff  Assistant: Debbrah Alar, PA-C  An assistant was required for this surgical procedure.  The duties of the assistant included but were not limited to suctioning, passing suture, camera manipulation, retraction. This procedure would not be able to be performed without an Environmental consultant.  Anesthesia: General  Complications: None  EBL: 500 cc  IVF: 850 cc crystalloid, 250 cc colloid  Specimens: 1. Retroperitoneal mass 2. Hepatic margin 3. Ascending colon  Disposition of specimens: Pathology  Intraoperative findings: The retroperitoneal mass was noted to be densely adherent to the inferior edge of the liver and the ascending colon.  In addition, there was noted to be a small pocket of walled off purulent material at the junction of the mass and the colon that was identified during the dissection and cultured intraoperatively.  Indication: Mr. Talarico is a 66 year old gentleman with end-stage renal disease, dialysis dependent, who was noted to have a locally recurrent mass concerning for renal cell carcinoma of the right kidney status post cryoablation.  This was noted during his renal transplant evaluation.  He underwent a laparoscopic nephrectomy at [facility] in the fall of 2016.  His surgery was quite complicated with intraoperative bleeding and postoperative infection requiring long-term drainage of abscess.  He subsequently was noted to have a retained/residual mass in the right renal fossa concerning for possible malignancy which was noted to be increasing in size reaching a size of 5.8 cm on his most recent imaging in November 2017.  He underwent a percutaneous biopsy  confirming a clear cell carcinoma.  He had no evidence of metastatic disease and after discussing options, he was most interested in proceeding with surgical resection of his residual mass.  The potential risks, consultations, and expected recovery process of this procedure were discussed in detail.  Specifically, we discussed the significantly increased risk of necessitating resection of surrounding structures, intraoperative complications related to his prior surgical history, and related to his complex medical history.  He did receive a cardiac risk assessment preoperatively was felt to be at acceptable risk to proceed with the planned procedure.  He gave informed consent to proceed.  Description of procedure:   The patient was taken to the operating room and a general anesthetic was administered.  He was given preoperative antibiotics, placed in the supine position, and prepped and draped in the usual sterile fashion.  Next a preoperative timeout was performed.  The patient had undergone a prior open cholecystectomy with a large right subcostal incision.  An incision in the skin was made through this existing scar and down through the subcutaneous tissues to the abdominal fascia.  There was significant scar in the subcutaneous tissues and his fascial planes were not able to be clearly delineated.  The anterior rectus fascia was identified and the underlying rectus muscle fibers divided.  The posterior rectus sheath was then sharply incised allowing entry into the peritoneal cavity.  Although there were no significant adhesions inferior to the incision, there were significant omental adhesions noted superiorly.  The fascia was then opened along the extent of the incision carefully with care to take down underlying adhesions off the abdominal wall as necessary.  I then sharply took down omental adhesions from the abdominal wall superiorly.  I was  unable to clearly identify the liver due to the significant  adhesions.  A self-retaining Bookwalter retractor was then placed.  We continued to take down the omental adhesions with a combination of sharp and cautery dissection as indicated.  This allowed [place] to identify the transverse colon.  This was followed laterally into the right side of the abdomen although the hepatic flexure could not be clearly identified due to significant adhesions and scarring in this area.  We did identify the descending colon inferiorly.  The peritoneum was excised and the colon was mobilized medially allowing the retroperitoneum to be exposed.  I was able to palpate the mass of concern from the inferior direction.  However, this mass was densely adherent to the ascending colon.  After tedious dissection to try to mobilize the hepatic flexure of the colon and fully delineate it, I was able to find the peritoneal surface of the liver laterally and was able to dissect along the liver down to the mass which appeared to be involving the lower and lateral edge of the liver.  This was also same site where the ascending colon was densely adherent to the mass.  At this point, I obtained an intraoperative general surgery consultation.  Dr. Leighton Ruff scrubbed in and assess the involvement of the colon and the liver.  Her portion of the procedure will be dictated separately.  In summary, it was ultimately determined that the patient did require a partial liver resection in order to fully resect the mass and this was performed by Dr. Marcello Moores. This was performed with a combination of cautery and the LigaSure device. A small hepatic margin was also obtained for final pathologic analysis.  She was able to identify the duodenum and separate it away from the mass. There was a small serosal tear of the duodenum which was identified and repaired.  After further dissection of the colon, there was noted be a walled off area of purulent fluid at the junction of the colon and the mass.  This was cultured  intraoperatively.  This was likely a result of the patient's prior infection and appeared to be a pre-existing walled off intra-abdominal abscess.  Further dissection of the colon near the tumor mass revealed that the mass was clearly invading the ascending colon.  This required entry into the colon for complete resection.  The mass was able to be completely resected and passed off for permanent pathologic analysis.  On examination the mass, there was concern for possible tumor spillage during the dissection although no residual mass was identified grossly and no gross positive margins were noted intraoperatively.  It was decided by Dr. Marcello Moores to perform a right colectomy at this point.  After the right colon was resected and the ileum was reanastomosed to the transverse colon, the retroperitoneum and intra-abdominal contents were examined.  The liver specifically was examined and there was a small amount of raw surface bleeding.  Surgi-Flo was applied to the liver.  This resulted in excellent hemostasis.  Sterile water was used to irrigate the right upper quadrant of the abdomen and the tumor site for tumor cytotoxic purposes.    Preparations were made for closure.  Based on identification of the intraabdominal abscess, a drain was left in place.  This was brought through separate stab incision in the right abdominal wall and placed in the right renal fossa and near the duodenum.  At this point, we changed gloves, drapes, and instruments per colon protocol.  Preparations were made  for closure.  A running 0 PDS suture was used to reapproximate the abdominal fascia.  Considering the obliteration of the fascial planes, this was performed in one layer.  Exparel was injected into the wound.  A separate subcutaneous 2-0 Vicryl layer was used to reapproximate the subcutaneous tissues.  The skin was reapproximated with staples.  A sterile honeycomb dressing was applied.  The patient appeared to tolerate the procedure  well and without complications.  He was able to be extubated and transferred to recovery unit in satisfactory condition.  I did notify nephrology of his admission so that he can receive inpatient hemodialysis.

## 2016-04-07 NOTE — Progress Notes (Signed)
PHARMACY NOTE:  ANTIMICROBIAL RENAL DOSAGE ADJUSTMENT  Current antimicrobial regimen includes a mismatch between antimicrobial dosage and estimated renal function.  As per policy approved by the Pharmacy & Therapeutics and Medical Executive Committees, the antimicrobial dosage will be adjusted accordingly.  Current antimicrobial dosage:  cefoxitin  Indication: intraabdominal abscess  Renal Function:  Estimated Creatinine Clearance: 19 mL/min (by C-G formula based on SCr of 4.7 mg/dL (H)). [x]      On intermittent HD, scheduled: []      On CRRT    Antimicrobial dosage has been changed to:  1gm IV q24h    Thank you for allowing pharmacy to be a part of this patient's care.  Lynelle Doctor, [facility] 04/07/2016 1:54 PM

## 2016-04-07 NOTE — Progress Notes (Signed)
eLink Physician-Brief Progress Note Patient Name: [name] DOB: 1950/08/20 MRN: [id number]   Date of Service  04/07/2016  HPI/Events of Note  Notified by RN that pt has a pulsating abdominal mass on exam, ? AAA. No evidence of this on abd CT scan from 11/04/15. Hemodynamically stable at this time. May merit further non-urgent eval, abdominal CT angio. Will pass this along to rounding team  eICU Interventions       Intervention Category Minor Interventions: Communication with other healthcare providers and/or family  BYRUM,ROBERT S. 04/07/2016, 7:52 PM

## 2016-04-07 NOTE — Anesthesia Postprocedure Evaluation (Addendum)
Anesthesia Post Note  Patient: [name]  Procedure(s) Performed: Procedure(s) (LRB): EXPLORATORY LAPAROTOMY AND RESECTION OF RETROPERITONEAL MASS (N/A) RIGHT COLECTOMY AND PARTIAL LIVER RESECTION (Right)  Patient location during evaluation: PACU Anesthesia Type: General Level of consciousness: sedated Pain management: pain level controlled Vital Signs Assessment: post-procedure vital signs reviewed and stable Respiratory status: spontaneous breathing, nonlabored ventilation, respiratory function stable and patient connected to nasal cannula oxygen Cardiovascular status: stable Postop Assessment: no signs of nausea or vomiting Anesthetic complications: no Comments: Requiring phenylephrine drip, will go to stepdown overnight. Labs drawn in PACU       Last Vitals:  Vitals:   04/07/16 1300 04/07/16 1305  BP: (!) 90/54 (!) 86/53  Pulse: 64   Resp: (!) 21   Temp:      Last Pain:  Vitals:   04/07/16 1245  TempSrc:   PainSc: Asleep                 Monterio Bob S

## 2016-04-07 NOTE — Progress Notes (Signed)
89mL of Fentanyl from PCA syringe wasted into sink. Witnessed by Lucile Crater, RN.

## 2016-04-07 NOTE — Progress Notes (Signed)
CRITICAL VALUE ALERT  Critical value received:  LA 2.0  Date of notification:  04/07/16  Time of notification:  2043  Critical value read back:Yes.    Nurse who received alert:  Kennis Carina, RN  MD notified (1st page):  Dr. Lamonte Sakai  Time of first page:  2052  MD notified (2nd page):  Time of second page:  Responding MD:  Dr. Lamonte Sakai  Time MD responded:  2053

## 2016-04-07 NOTE — Consult Note (Signed)
PULMONARY / CRITICAL CARE MEDICINE   Name: [name] MRN: [id number] DOB: 03-Oct-1950    ADMISSION DATE:  04/07/2016 CONSULTATION DATE:  04/07/2016   REFERRING MD:  Dr Alinda Money  CHIEF COMPLAINT:  Post op hypotension  HISTORY OF PRESENT ILLNESS:  16 y old male with multiple medical problems including CAD and CABG 2013, DM with blindness,  ESRD on HD x 5-6 years MWF (currently R IJ HD catha nd LUE fistula that is  Maturing). Prostate cancer and s/p XRT seeds at [facility] and is s/p prior left nephrectomy. He currently has right Retroperitoneal mass that is deemed as rresidual renal cell carcinoma without evidence of mets (s/p incomplete excision 1 year ago at outside hospital). On 04/07/16 - underwent Rt retroperitonal mass resection and associated right hemicolectomy (Right upper quadrant renal cell carcinoma adherent to the right colon) and +/- small adjacent liver resection . Estimated blood loss in OR 500cc  Post op extubated in PACU but was needing neosynephrine (baseline on midodrine and per wife sbp 90s) on 2mcg and PCCM consulted. At time of PCCM consult - post op - 300cc of frank blood via JP drain reported and per CCS aware  Did get dilaudid for pain post op in ICU and since then drowsy   Past medical hx recent - His renal history is complex and per Dr Alinda Money, "He was initially diagnosed with renal cell carcinoma proximally 7 years ago and was found to have a biopsy-proven clear cell carcinoma of the right kidney. He was treated with percutaneous ablation with radiofrequency ablation by [name]. He subsequently developed a contralateral clear cell carcinoma of the left kidney a few years later and was treated with percutaneous cryoablation. He subsequently developed end-stage renal disease requiring dialysis. He did pursue a possible transplant evaluation and ultimately was found to have a recurrent enhancing right renal mass at the site of his prior ablation defect on the right kidney.  This was concerning for possible recurrence. He had been undergoing an evaluation at [facility] by their transplant Department. He was seen by the transplant surgeons and ultimately proceeded with a laparoscopic right nephrectomy in October 2016. This procedure was apparently quite complex likely related to his prior ablation. He suffered a very prolonged postoperative course developing a postoperative abscess that was quite large requiring drainage. His drains were ultimately removed approximately 8 months later in June 2017. His final pathology did not actually demonstrate any malignancy. He was admitted to the hospital in August for a febrile infection. He did undergo imaging as part of his evaluation demonstrated what appeared to be an enhancing mass in his right renal fossa raising concern for possible residual or recurrent abscess versus recurrent malignancy versus a benign process. I had actually personally seen him as a consult at that time. It was recommended that he follow-up with his surgeon who had performed his original nephrectomy. However, the patient and his wife state that they were not happy with the approach it was being taken by the surgeon who had recommended conservative follow-up imaging. They therefore saw [name] proceeded with a percutaneous biopsy of this mass on 02/02/16 confirming clear cell carcinoma consistent with his primary renal cell carcinoma. Of note, this mass had increased in size from approximately 3.7 cm in August to 5.8 cm on his most recent imaging this November. There were no other findings to suggest metastatic disease to the abdomen. He also underwent a CT scan of the chest on 02/15/16 that demonstrated  no evidence of metastatic disease to the lungs. Ultimately, it appears that the patient has a residual massive renal cell carcinoma that was not removed at the time of his nephrectomy one year ago. "   PAST MEDICAL HISTORY :  He  has a past medical history  of Anemia; Arthritis; Blind; Bruises easily; CAD, NATIVE VESSEL (01/08/2008); Cellulitis (late 1980's); Colon polyps; Diabetic neuropathy ([place]); DM type 2 (diabetes mellitus, type 2) ([place]); ESRD (end stage renal disease) on dialysis ([place]); Family history of breast cancer; Heart murmur; History of blood product transfusion; HYPERLIPIDEMIA-MIXED (01/08/2008); HYPERTENSION (01/27/2009); Hypotension; Kidney stones; Low blood pressure; OVERWEIGHT/OBESITY (01/08/2008); Peripheral vascular disease ([place]); Pneumonia (2000;s X 1); Prostate cancer ([place]); Prosthetic eye globe; Renal cell carcinoma (2001 and 2003); and Renal insufficiency.  PAST SURGICAL HISTORY: He  has a past surgical history that includes Foot Amputation (Right, ~ 2002); Colonoscopy (06/14/2011); Hot hemostasis (06/14/2011); AV fistula placement (Left, 02/2010); Varicose vein surgery (mid 1980's ); Radiofrequency ablation kidney (2010-2012); Coronary artery bypass graft (09/29/2011); Colonoscopy (N/A, 07/24/2012); Hip pinning, cannulated (Left, 12/31/2013); left heart catheterization with coronary angiogram (N/A, 09/27/2011); Colonoscopy with propofol (N/A, 05/13/2014); Colonoscopy; Enucleation (2003; 2006); Cholecystectomy open (1992); Insertion prostate radiation seed (03/2012); Cystoscopy with retrograde pyelogram, ureteroscopy and stent placement (Left, 07/28/2014); Ligation of competing branches of arteriovenous fistula (Left, 07/29/2014); Nephrectomy (Right, 01/30/2015); Revison of arteriovenous fistula (Left, 09/01/2015); Colonoscopy (N/A, 11/04/2015); AV fistula placement (Left, 01/12/2016); Ligation of arteriovenous  fistula (Left, 01/12/2016); Insertion of dialysis catheter (Right, 01/12/2016); ir generic historical (01/13/2016); and Fracture surgery.  Allergies  Allergen Reactions  . Codeine Other (See Comments)     [place]  . Tape Other (See Comments)    Plastic tape tears skin off, please use paper tape instead.    Current  Facility-Administered Medications on File Prior to Encounter  Medication  . Chlorhexidine Gluconate Cloth 2 % PADS 6 each   Current Outpatient Prescriptions on File Prior to Encounter  Medication Sig  . aspirin EC 81 MG tablet Take 81 mg by mouth at bedtime.  [name] atorvastatin (LIPITOR) 10 MG tablet TAKE 1 TABLET BY MOUTH EVERY DAY (Patient taking differently: TAKE 1 TABLET BY MOUTH DAILY AT BEDTIME)  . cinacalcet (SENSIPAR) 90 MG tablet Take 90 mg by mouth daily.  . midodrine (PROAMATINE) 10 MG tablet Take 1 tablet (10 mg total) by mouth 3 (three) times daily with meals.  . multivitamin (RENA-VIT) TABS tablet Take 1 tablet by mouth at bedtime.  . Probiotic Product (ALIGN) 4 MG CAPS Take 1 capsule by mouth daily.  . sevelamer carbonate (RENVELA) 800 MG tablet Take 2,400-4,800 mg by mouth See admin instructions. Take 6 tablets (4800 mg) by mouth 3 times daily with meals and 3 tablets (2400 mg) 2 times daily with snacks  . calcitRIOL (ROCALTROL) 0.25 MCG capsule Take 0.25 mcg by mouth 3 (three) times a week. Reported on 08/26/2015  . heparin 5000 UNIT/ML injection Inject 8,000 Units into the skin 3 (three) times a week. Monday, Wednesday, and Friday.  . Methoxy PEG-Epoetin Beta (MIRCERA) 50 MCG/0.3ML SOSY Inject 225 mcg as directed every 14 (fourteen) days.     FAMILY HISTORY:  Not elicitable  SOCIAL HISTORY: He  reports that he has never smoked. He has never used smokeless tobacco. He reports that he does not drink alcohol or use drugs.   VITAL SIGNS: BP (!) 104/53   Pulse 67   Temp 97.3 F (36.3 C) (Axillary)   Resp 12   Ht 6\' 4"  (  1.93 m)   Wt 89.5 kg (197 lb 5 oz)   SpO2 100%   BMI 24.02 kg/m   HEMODYNAMICS:    VENTILATOR SETTINGS:    INTAKE / OUTPUT: No intake/output data recorded.  PHYSICAL EXAMINATION: General:  Obese male, post op Neuro:  Drowsy. Moaned to voice (post dilaudid() HEENT:  O2, no neck nodes, no elevatd jvp Cardiovascular:  Normal heart sounds Lungs:   CTA bilaterally, No distress. Pulse ox 100% on 4L [place] Abdomen:  Obese, soft, RUQ JP drain with frank blood but not oozing out Musculoskeletal:  No cyanosis. No clubbing. No edema Skin:  Intact anteriorly  LABS:  PULMONARY No results for input(s): PHART, PCO2ART, PO2ART, HCO3, TCO2, O2SAT in the last 168 hours.  Invalid input(s): PCO2, PO2  CBC  Recent Labs Lab 04/07/16 0634 04/07/16 1305  HGB 11.4* 11.1*  HCT 33.1* 33.7*  WBC 3.6*  --   PLT 112*  --     COAGULATION No results for input(s): INR in the last 168 hours.  CARDIAC  No results for input(s): TROPONINI in the last 168 hours. No results for input(s): PROBNP in the last 168 hours.   CHEMISTRY  Recent Labs Lab 04/07/16 0634 04/07/16 1305  NA 134* 132*  K 4.4 4.0  CL 92* 94*  CO2 30 22  GLUCOSE 90 156*  BUN 44* 48*  CREATININE 4.36* 4.70*  CALCIUM 9.6 8.9   Estimated Creatinine Clearance: 19 mL/min (by C-G formula based on SCr of 4.7 mg/dL (H)).   LIVER No results for input(s): AST, ALT, ALKPHOS, BILITOT, PROT, ALBUMIN, INR in the last 168 hours.   INFECTIOUS No results for input(s): LATICACIDVEN, PROCALCITON in the last 168 hours.   ENDOCRINE CBG (last 3)   Recent Labs  04/05/16 1102 04/07/16 0546 04/07/16 1210  GLUCAP 137* 86 146*         IMAGING x48h  - image(s) personally visualized  -   highlighted in bold No results found.      ASSESSMENT / PLAN:  PULMONARY A: No resp distress but at risk for post op hypercapnia due to ESRD + opioids for pain  P:   - keep pulse ox > 88% - avoid xs 02 - end tidal co2 monitoring   CARDIOVASCULAR A:  - Baseline - sbp 90s with middorine  - Post op circulatory shock needin neo - lilely volume shifts and reflective underlying chronic hypotension  P:  Fluid bolus 500cc Wean neo for sbp goal > 90  RENAL A:   esrd  P:   Per renal  GASTROINTESTINAL A:   S/p r hemicolectomy due to renal cancer adherent with colon   04/07/2016  Post op JP drai with some blood  P:   Per CCS  HEMATOLOGIC A:   At risk for anemia of bleeding v critical illness v both P:  Monitor   INFECTIOUS A:   No evidence of acute infection but at risk P:   monitor  ENDOCRINE A:   DM   P:   ssi  NEUROLOGIC A:   Post op pain - unable to do PCA. Currently drowsy after dilaudid  P:   Change dilaudid to fentanyl prn in setting of ESRD EtCo2 monitoring RASS goal: o t0 -2    FAMILY  - Updates: wife and son updated at bedside 04/07/2016   - Inter-disciplinary family meet or Palliative Care meeting due by1/11/18    The patient is critically ill with multiple organ systems failure and requires high  complexity decision making for assessment and support, frequent evaluation and titration of therapies, application of advanced monitoring technologies and extensive interpretation of multiple databases.   Critical Care Time devoted to patient care services described in this note is  30  Minutes. This time reflects time of care of this signee Dr Brand Males. This critical care time does not reflect procedure time, or teaching time or supervisory time of PA/NP/Med student/Med Resident etc but could involve care discussion time    Dr. Brand Males, M.D., [facility].C.P Pulmonary and Critical Care Medicine Staff Physician [place] [facility] Pager: [phone], If no answer or between  15:00h - 7:00h: call [phone]  04/07/2016 4:49 PM

## 2016-04-07 NOTE — Consult Note (Signed)
[name] Admit Date: 04/07/2016 04/07/2016 Rexene Agent Requesting Physician:  Alinda Money MD  Reason for Consult:  Comanagement of ESRD HPI:  [id number] seen at request of Dr. Alinda Money for ESRD management in perioperative period.    PMH Incudes:  Hx/o b/l RCC; recurrent retroperitoneal mass on R side s/p resection and R hemicolectomy today.  Hx/o L nephrectomy previously   DM2  CAD s/p CABG  Blindness  Hx/o prostate cancer s/p seed XRT  PVD  Is in [facility] ICU given acuity in post-operative period on PE gtt.  On high flow [place], BP in 80s and 90s.  Afebrile. Rec 1.1L of IVF during surgery, some crystalloid, some albumin.  Post Op labs stable.  Afebrile. During operation identified likely abscess, to rec ABX.    No recent issues with HD treatments.  Postop in ICU, BP on soft side.    Outpt HD Orders Unit: [facility] Days: MWF Time: 4.5h Dialyzer: F200 EDW: 90kg K/Ca: 2/2 Access: R IJ TDC, maturing LUE BC AVF placed 01/12/16 Needle Size: na BFR/DFR: 450/800 UF Proflie: none VDRA: calcitriol 1.50mcg qTx EPO: Mircera 134mcg q2wk -- last given 03/30/16 IV Fe: none Heparin: 8000 IVB qTx Most Recent Phos / PTH: 5.0 / 493 Most Recent TSAT / Ferritin: 35 / 1505 Most Recent eKT/V: 1.57 Treatment Adherence: excellent  ROS Balance of 12 systems is negative w/ exceptions as above  PMH  Past Medical History:  Diagnosis Date  . Anemia   . Arthritis    "back, neck" (07/28/2014)  . Blind    both eyes removed   . Bruises easily   . CAD, NATIVE VESSEL 01/08/2008      . Cellulitis late 1980's   "hospitalized; wrapped both legs; several times; no OR for this"  . Colon polyps   . Diabetic neuropathy ([place])   . DM type 2 (diabetes mellitus, type 2) ([place])    no medications (07/28/2014) diet and excersie controlled  . ESRD (end stage renal disease) on dialysis [facility])    [name]; Mon, Wed, Fri (07/28/2014)  . Family history of breast cancer    mother  . Heart murmur    hx of  . History of  blood product transfusion   . HYPERLIPIDEMIA-MIXED 01/08/2008  . HYPERTENSION 01/27/2009   BP low , hx of HTN, Currently on meds to raise BP  . Hypotension   . Kidney stones   . Low blood pressure   . OVERWEIGHT/OBESITY 01/08/2008   Lost 205 lbs through diet and exercise.    . Peripheral vascular disease ([place])    vein stripping  . Pneumonia 2000;s X 1  . Prostate cancer ([place])   . Prosthetic eye globe    both eyes  . Renal cell carcinoma 2001 and 2003   "both kidneys"  . Renal insufficiency    PSH  Past Surgical History:  Procedure Laterality Date  . AV FISTULA PLACEMENT Left 02/2010   LFA  . AV FISTULA PLACEMENT Left 01/12/2016   Procedure: LEFT BRACHIOCEPHALIC ARTERIOVENOUS (AV) FISTULA CREATION;  Surgeon: Angelia Mould, MD;  Location: [place];  Service: Vascular;  Laterality: Left;  . CHOLECYSTECTOMY OPEN  1992  . COLONOSCOPY  06/14/2011   Procedure: COLONOSCOPY;  Surgeon: [name], MD;  Location: [facility];  Service: Endoscopy;  Laterality: N/A;  . COLONOSCOPY N/A 07/24/2012   Procedure: COLONOSCOPY;  Surgeon: [name], MD;  Location: [facility];  Service: Endoscopy;  Laterality: N/A;  . COLONOSCOPY    .  COLONOSCOPY N/A 11/04/2015   Procedure: COLONOSCOPY;  Surgeon: Manus Gunning, MD;  Location: [facility] ENDOSCOPY;  Service: Gastroenterology;  Laterality: N/A;  . COLONOSCOPY WITH PROPOFOL N/A 05/13/2014   Procedure: COLONOSCOPY WITH PROPOFOL;  Surgeon: [name], MD;  Location: [facility];  Service: Endoscopy;  Laterality: N/A;  . CORONARY ARTERY BYPASS GRAFT  09/29/2011   Procedure: CORONARY ARTERY BYPASS GRAFTING (CABG);  Surgeon: [name], MD;  Location: [place];  Service: Open Heart Surgery;  Laterality: N/A;  Coronary Artery Bypass Graft times two utilizing the left internal mammary artery and the right greater saphenous vein harvested endoscopically.  . CYSTOSCOPY WITH RETROGRADE PYELOGRAM, URETEROSCOPY AND STENT PLACEMENT Left 07/28/2014    Procedure: CYSTOSCOPY WITH RETROGRADE PYELOGRAM, URETERAL BALLOON DILITATION, URETEROSCOPY AND LEFT STENT PLACEMENT;  Surgeon: [name], MD;  Location: [facility];  Service: Urology;  Laterality: Left;  . ENUCLEATION  2003; 2006   bilateral; "diabetes; pain"  . FOOT AMPUTATION Right ~ 2002   right;  partial; "infection"  . FRACTURE SURGERY     hip fx  . HIP PINNING,CANNULATED Left 12/31/2013   Procedure: CANNULATED HIP PINNING;  Surgeon: Renette Butters, MD;  Location: [place];  Service: Orthopedics;  Laterality: Left;  Carm, FX Table, [place]  . HOT HEMOSTASIS  06/14/2011   Procedure: HOT HEMOSTASIS (ARGON PLASMA COAGULATION/BICAP);  Surgeon: [name], MD;  Location[facility];  Service: Endoscopy;  Laterality: N/A;  . INSERTION OF DIALYSIS CATHETER Right 01/12/2016   Procedure: INSERTION OF DIALYSIS CATHETER;  Surgeon: Angelia Mould, MD;  Location: [place];  Service: Vascular;  Laterality: Right;  . INSERTION PROSTATE RADIATION SEED  03/2012  . IR GENERIC HISTORICAL  01/13/2016   IR RADIOLOGIST EVAL & MGMT 01/13/2016 Aletta Edouard, MD [facility] INTERV RAD  . LEFT HEART CATHETERIZATION WITH CORONARY ANGIOGRAM N/A 09/27/2011   Procedure: LEFT HEART CATHETERIZATION WITH CORONARY ANGIOGRAM;  Surgeon: [name], MD;  Location: [facility] CATH LAB;  Service: Cardiovascular;  Laterality: N/A;  . LIGATION OF ARTERIOVENOUS  FISTULA Left 01/12/2016   Procedure: LIGATION OF LEFT RADIOCEPHALIC ARTERIOVENOUS  FISTULA;  Surgeon: Angelia Mould, MD;  Location: [place];  Service: Vascular;  Laterality: Left;  . LIGATION OF COMPETING BRANCHES OF ARTERIOVENOUS FISTULA Left 07/29/2014   Procedure: LIGATION OF COMPETING BRANCHES OF LEFT ARM ARTERIOVENOUS FISTULA;  Surgeon: [name], MD;  Location: [place];  Service: Vascular;  Laterality: Left;  . NEPHRECTOMY Right 01/30/2015   done at [place]  . RADIOFREQUENCY ABLATION KIDNEY  2010-2012   "twice; one on each side; for cancer"  .  REVISON OF ARTERIOVENOUS FISTULA Left 09/01/2015   Procedure: EXPLORATION LEFT LOWER ARM  RADIOCEPHALIC ARTERIOVENOUS FISTULA;  Surgeon: Conrad Crockett, MD;  Location: [place];  Service: Vascular;  Laterality: Left;  [name] VARICOSE VEIN SURGERY  mid 1980's    BLE; "knees down; both legs; 2 separate times"   FH  Family History  Problem Relation Age of Onset  . Cancer Mother 3    breast, spine and ovarian  . Heart disease Mother   . Hyperlipidemia Mother   . Hypertension Mother   . Varicose Veins Mother   . Emphysema Father   . Cancer Father 24    kidney, prostate  . Heart disease Father   . Hyperlipidemia Father   . Hypertension Father   . Cancer Sister     ovarian  . Cancer Brother     lung  . Heart disease Brother   .  Hyperlipidemia Brother   . Hypertension Brother   . Heart attack Brother   . Peripheral vascular disease Brother   . Cancer    . Coronary artery disease    . Kidney disease    . Breast cancer    . Ovarian cancer    . Lung cancer    . Diabetes    . Malignant hyperthermia Neg Hx    SH  reports that he has never smoked. He has never used smokeless tobacco. He reports that he does not drink alcohol or use drugs. Allergies  Allergies  Allergen Reactions  . Codeine Other (See Comments)     [place]  . Tape Other (See Comments)    Plastic tape tears skin off, please use paper tape instead.   Home medications Prior to Admission medications   Medication Sig Start Date End Date Taking? Authorizing Provider  aspirin EC 81 MG tablet Take 81 mg by mouth at bedtime.   Yes Historical Provider, MD  atorvastatin (LIPITOR) 10 MG tablet TAKE 1 TABLET BY MOUTH EVERY DAY Patient taking differently: TAKE 1 TABLET BY MOUTH DAILY AT BEDTIME 05/13/14  Yes Thayer Headings, MD  cinacalcet (SENSIPAR) 90 MG tablet Take 90 mg by mouth daily.   Yes Historical Provider, MD  midodrine (PROAMATINE) 10 MG tablet Take 1 tablet (10 mg total) by mouth 3 (three) times daily with  meals. 11/05/15  Yes Reyne Dumas, MD  multivitamin (RENA-VIT) TABS tablet Take 1 tablet by mouth at bedtime.   Yes Historical Provider, MD  Probiotic Product (ALIGN) 4 MG CAPS Take 1 capsule by mouth daily.   Yes Historical Provider, MD  sevelamer carbonate (RENVELA) 800 MG tablet Take 2,400-4,800 mg by mouth See admin instructions. Take 6 tablets (4800 mg) by mouth 3 times daily with meals and 3 tablets (2400 mg) 2 times daily with snacks 06/26/14  Yes Historical Provider, MD  calcitRIOL (ROCALTROL) 0.25 MCG capsule Take 0.25 mcg by mouth 3 (three) times a week. Reported on 08/26/2015    Historical Provider, MD  heparin 5000 UNIT/ML injection Inject 8,000 Units into the skin 3 (three) times a week. Monday, Wednesday, and Friday.    Historical Provider, MD  HYDROcodone-acetaminophen (NORCO) 5-325 MG tablet Take 1-2 tablets by mouth every 6 (six) hours as needed. 04/07/16   Debbrah Alar, PA-C  Methoxy PEG-Epoetin Beta (MIRCERA) 50 MCG/0.3ML SOSY Inject 225 mcg as directed every 14 (fourteen) days.     Historical Provider, MD    Current Medications Scheduled Meds: . atorvastatin  10 mg Oral Daily  . [START ON 04/08/2016] calcitRIOL  0.25 mcg Oral Once per day on Mon Wed Fri  . cefOXitin  1 g Intravenous Q8H  . cinacalcet  90 mg Oral Q breakfast  . fentaNYL   Intravenous Q4H  . insulin aspart  0-15 Units Subcutaneous Q4H  . midodrine  10 mg Oral TID WC  . sevelamer carbonate  4,800 mg Oral TID WC  . sodium chloride       Continuous Infusions: . sodium chloride    . phenylephrine (NEO-SYNEPHRINE) Adult infusion     PRN Meds:.diphenhydrAMINE **OR** diphenhydrAMINE, diphenhydrAMINE **OR** diphenhydrAMINE, naloxone **AND** sodium chloride flush, ondansetron, ondansetron (ZOFRAN) IV, sevelamer carbonate  CBC  Recent Labs Lab 04/07/16 0634  WBC 3.6*  HGB 11.4*  HCT 33.1*  MCV 101.5*  PLT 287*   Basic Metabolic Panel  Recent Labs Lab 04/07/16 0634 04/07/16 1305  NA 134* 132*  K 4.4 4.0  CL 92* 94*  CO2 30 22  GLUCOSE 90 156*  BUN 44* 48*  CREATININE 4.36* 4.70*  CALCIUM 9.6 8.9    Physical Exam  Blood pressure (!) 87/48, pulse 67, temperature 97.3 F (36.3 C), resp. rate 18, height 6\' 4"  (1.93 m), weight 90.7 kg (200 lb), SpO2 99 %. GEN: NAD ENT: NCAT, [place] in place EYES: Eyes closed, blind CV: RRR PULM: CTAB, ant auscultation ABD: RUQ incision dressed SKIN: no rashes/lesions; R IJ TDC C/D/I EXT:no edema LUE AVF +B/T, maturing nicely   Assessment 58M ESRD s/p resection of retroperitoneal mass and R hemicolectomy 04/07/16 for recurrent RCC  1. ESRD 2. Recurrent RCC s/p resection, R hemicolectomy 04/08/15 3. CAD s/p CABG 4. LUE AVF 5. Anemia 6. [place]  Plan 1. No RRT needed.  Reassess tomorrow; if BP remains soft on PE might be best to use CVVHD for 24h  2. Resume binders when eating full diet 3. No ESA needed, or Fe current   Pearson Grippe MD [phone] [phone] 04/07/2016, 1:55 PM

## 2016-04-07 NOTE — Progress Notes (Signed)
Patient ID: KEY CEN, male   DOB: [birth year], 66 y.o.   MRN: [id number]   Day of Surgery Subjective: Pt admitted to step down ICU due to hypotension and need for pressors.  Objective: Vital signs in last 24 hours: Temp:  [97.3 F (36.3 C)-98.3 F (36.8 C)] 97.3 F (36.3 C) (01/04 1600) Pulse Rate:  [58-73] 73 (01/04 1730) Resp:  [11-21] 11 (01/04 1730) BP: (78-133)/(48-64) 89/48 (01/04 1730) SpO2:  [94 %-100 %] 94 % (01/04 1730) Weight:  [89.5 kg (197 lb 5 oz)-90.7 kg (200 lb)] 89.5 kg (197 lb 5 oz) (01/04 1350)  Intake/Output from previous day: No intake/output data recorded. Intake/Output this shift: Total I/O In: 1884.1 [I.V.:1134.1; IV Piggyback:750] Out: 6546 [Urine:310; Drains:250; Blood:500]  Physical Exam:  General: Pt somnolent. Abdomen: Soft, ND, Dressing intact, Drain with bloody fluid (about 250 cc total since surgery)  Lab Results:  Recent Labs  04/07/16 0634 04/07/16 1305  HGB 11.4* 11.1*  HCT 33.1* 33.7*   BMET  Recent Labs  04/07/16 0634 04/07/16 1305  NA 134* 132*  K 4.4 4.0  CL 92* 94*  CO2 30 22  GLUCOSE 90 156*  BUN 44* 48*  CREATININE 4.36* 4.70*  CALCIUM 9.6 8.9     Studies/Results: No results found.  Assessment/Plan: - Appreciate CCM management of hypotension - Appreciate nephrology assistance with patient's care, dialysis probably tomorrow - Bedrest tonight with concerns re: hypotension.  Drainage is likely as much peritoneal fluid as blood based on how it looks and with stable Hgb post-op but will recheck Hgb tonight.   LOS: 0 days   Carroll Ranney,LES 04/07/2016, 5:44 PM

## 2016-04-07 NOTE — Transfer of Care (Signed)
Immediate Anesthesia Transfer of Care Note  Patient: [name]  Procedure(s) Performed: Procedure(s): EXPLORATORY LAPAROTOMY AND RESECTION OF RETROPERITONEAL MASS (N/A)  Patient Location: PACU  Anesthesia Type:General  Level of Consciousness: awake, alert  and oriented  Airway & Oxygen Therapy: Patient Spontanous Breathing and Patient connected to face mask oxygen  Post-op Assessment: Report given to RN and Post -op Vital signs reviewed and stable  Post vital signs: Reviewed and stable  Last Vitals:  Vitals:   04/07/16 0624  BP: 102/64  Pulse: (!) 58  Resp: 18  Temp: 36.7 C    Last Pain:  Vitals:   04/07/16 0624  TempSrc: Oral         Complications: No apparent anesthesia complications

## 2016-04-07 NOTE — Op Note (Addendum)
04/07/2016  11:15 AM  PATIENT:  [name]  66 y.o. male  Patient Care Team: [name], MD as PCP - General (Internal Medicine) Mauricia Area, MD as Consulting Physician (Nephrology)  PRE-OPERATIVE DIAGNOSIS:  RENAL CELL CARCINOMA  POST-OPERATIVE DIAGNOSIS:  RENAL CELL CARCINOMA  PROCEDURE:   EXPLORATORY LAPAROTOMY AND RESECTION OF RETROPERITONEAL MASS RIGHT HEMICOLECTOMY, WEDGE RESECTION OF LIVER  Co-SURGEONS:   Raynelle Bring, MD Leighton Ruff, MD    ANESTHESIA:   general  EBL:  Total I/O In: -  Out: 800 [Urine:300; Blood:500]  DRAINS: (27F) Jackson-Pratt drain(s) with closed bulb suction in the RUQ   SPECIMEN:  Source of Specimen:  R colon and RUQ mass  DISPOSITION OF SPECIMEN:  PATHOLOGY  COUNTS:  YES  PLAN OF CARE: Admit to inpatient   PATIENT DISPOSITION:  PACU - hemodynamically stable.  INDICATION: 66 year old male on dialysis with an enlarging retroperitoneal mass. Biopsies confirmed renal cell carcinoma. He is here today for resection.   OR FINDINGS: Right upper quadrant renal cell carcinoma adherent to the right colon  DESCRIPTION: I was called to the operating room by Dr. Alinda Money. He was performing an exploratory laparotomy for a metastatic renal cell carcinoma. The patient previously had a large open operation with subsequent postoperative infection and had significant scar tissue to the right upper quadrant. Dr. Alinda Money requested my assistance in freeing this off the colon or resecting the colon if needed. The patient was laid supine on the operating room table with the left side bumped slightly under general anesthesia.  I reviewed the patient's CT scan and then joined him in the operation.  We explored the patient's abdomen through his subcostal incision. Identified the terminal ileum and cecum. This was free from adhesions. I traced the ascending colon up to the area of concern and it appeared to be involving a portion of the mass. The transverse  colon on the other side of the mass appeared adherent to this tissue as well. I began by freeing off the hepatic plane. We divided a small portion of liver with this to allow for additional margin. The tumor was abutting the liver but not adherent to it. We then came around posteriorly into the retroperitoneal space. The right kidney had I been removed and therefore we were able to free the surrounding fat from the tumor site. Identified the stomach and pylorus. I traced this down to the duodenum. The duodenum was adherent to a portion of either scar tissue or tumor and was carefully dissected away from this using Metzenbaum scissors. I placed 3, 3-0 silk sutures and a small serosal tear on the duodenum. There were no other injuries noted. I then tried to separate the ascending colon from the mass using blunt dissection along with short dissection. We did enter a small cavity of purulence during this which was sampled for microbiology. I then turned my attention to the posterior plane. We divided the remainder of the attachments to the retroperitoneum. This then left the transverse colon as the only piece adherent to the tumor site. I tried to mobilize the plane between the tumor and the transverse colon but the back wall was adherent to the tumor and most likely invading.  We decided to do a right hemicolectomy instead. I divided the transverse colon with a blue load GIA 75 mm stapler. I divided the right colon and similar fashion. This then freed the mass to be sent to pathology. I decided to complete the right hemicolectomy to allow for  a small bowel to large bowel anastomosis. The cecum was mobilized from the right lower quadrant along with the terminal ileum. The mesentery was divided using a LigaSure device. This was sent to pathology as an additional specimen.  Once the terminal ileum was completely mobilized, I created a into and anastomosis using a 75 mm blue load stapler to the transverse colon. There was  a good pulse located in the transverse colon. The common enterotomy site was then closed with a 60 mm TA blue load stapler. This staple line was oversewn using interrupted 3-0 silk sutures. I decided not to close the mesenteric defect as it was large and there was really no good tissue in the anterior portion of the mesentery that I could sew to. The abdomen was irrigated with sterile water. The edge of the liver and the posterior resection plane were oozing somewhat. Hemostasis was achieved using Bovie electrocautery. We placed FloSeal on the liver edge to allow for additional hemostasis. A 19 [place] Blake drain was then placed and the right upper quadrant. The remainder of the abdomen was inspected and no other pathology could be noted except for small bowel Meckel's diverticulum.  The omentum was then brought back over the small bowel. I then left Dr. Alinda Money is assistant to close the abdomen.

## 2016-04-07 NOTE — Anesthesia Procedure Notes (Signed)
Procedure Name: Intubation Date/Time: 04/07/2016 7:28 AM Performed by: Glory Buff Pre-anesthesia Checklist: Patient identified, Emergency Drugs available, Suction available and Patient being monitored Patient Re-evaluated:Patient Re-evaluated prior to inductionOxygen Delivery Method: Circle system utilized Preoxygenation: Pre-oxygenation with 100% oxygen Intubation Type: IV induction Ventilation: Mask ventilation without difficulty Laryngoscope Size: Miller and 3 Grade View: Grade I Tube type: Oral Tube size: 7.5 mm Number of attempts: 1 Airway Equipment and Method: Stylet and Oral airway Placement Confirmation: ETT inserted through vocal cords under direct vision,  positive ETCO2 and breath sounds checked- equal and bilateral Secured at: 22 cm Tube secured with: Tape Dental Injury: Teeth and Oropharynx as per pre-operative assessment

## 2016-04-08 ENCOUNTER — Inpatient Hospital Stay (HOSPITAL_COMMUNITY): Payer: Medicare Other

## 2016-04-08 ENCOUNTER — Encounter (HOSPITAL_COMMUNITY): Payer: Self-pay | Admitting: Urology

## 2016-04-08 DIAGNOSIS — R011 Cardiac murmur, unspecified: Secondary | ICD-10-CM

## 2016-04-08 DIAGNOSIS — Z992 Dependence on renal dialysis: Secondary | ICD-10-CM

## 2016-04-08 DIAGNOSIS — N186 End stage renal disease: Secondary | ICD-10-CM

## 2016-04-08 DIAGNOSIS — C641 Malignant neoplasm of right kidney, except renal pelvis: Principal | ICD-10-CM

## 2016-04-08 LAB — CBC WITH DIFFERENTIAL/PLATELET
BASOS ABS: 0 10*3/uL (ref 0.0–0.1)
BASOS PCT: 0 %
EOS ABS: 0 10*3/uL (ref 0.0–0.7)
Eosinophils Relative: 0 %
HEMATOCRIT: 27.6 % — AB (ref 39.0–52.0)
HEMOGLOBIN: 9.7 g/dL — AB (ref 13.0–17.0)
Lymphocytes Relative: 5 %
Lymphs Abs: 0.5 10*3/uL — ABNORMAL LOW (ref 0.7–4.0)
MCH: 35.1 pg — ABNORMAL HIGH (ref 26.0–34.0)
MCHC: 35.1 g/dL (ref 30.0–36.0)
MCV: 100 fL (ref 78.0–100.0)
Monocytes Absolute: 0.6 10*3/uL (ref 0.1–1.0)
Monocytes Relative: 7 %
NEUTROS ABS: 8.2 10*3/uL — AB (ref 1.7–7.7)
NEUTROS PCT: 89 %
Platelets: 119 10*3/uL — ABNORMAL LOW (ref 150–400)
RBC: 2.76 MIL/uL — AB (ref 4.22–5.81)
RDW: 14.8 % (ref 11.5–15.5)
WBC: 9.2 10*3/uL (ref 4.0–10.5)

## 2016-04-08 LAB — BASIC METABOLIC PANEL
ANION GAP: 12 (ref 5–15)
ANION GAP: 11 (ref 5–15)
ANION GAP: 15 (ref 5–15)
BUN: 55 mg/dL — ABNORMAL HIGH (ref 6–20)
BUN: 62 mg/dL — ABNORMAL HIGH (ref 6–20)
BUN: 65 mg/dL — AB (ref 6–20)
CALCIUM: 9 mg/dL (ref 8.9–10.3)
CALCIUM: 8.2 mg/dL — AB (ref 8.9–10.3)
CO2: 25 mmol/L (ref 22–32)
CO2: 24 mmol/L (ref 22–32)
CO2: 27 mmol/L (ref 22–32)
Calcium: 9 mg/dL (ref 8.9–10.3)
Chloride: 97 mmol/L — ABNORMAL LOW (ref 101–111)
Chloride: 96 mmol/L — ABNORMAL LOW (ref 101–111)
Chloride: 98 mmol/L — ABNORMAL LOW (ref 101–111)
Creatinine, Ser: 5.11 mg/dL — ABNORMAL HIGH (ref 0.61–1.24)
Creatinine, Ser: 5.94 mg/dL — ABNORMAL HIGH (ref 0.61–1.24)
Creatinine, Ser: 5.83 mg/dL — ABNORMAL HIGH (ref 0.61–1.24)
GFR calc Af Amer: 11 mL/min — ABNORMAL LOW (ref >60)
GFR, EST AFRICAN AMERICAN: 10 mL/min — AB (ref >60)
GFR, EST AFRICAN AMERICAN: 12 mL/min — AB (ref >60)
GFR, EST NON AFRICAN AMERICAN: 9 mL/min — AB (ref >60)
GFR, EST NON AFRICAN AMERICAN: 11 mL/min — AB (ref >60)
GFR, EST NON AFRICAN AMERICAN: 9 mL/min — AB (ref >60)
GLUCOSE: 125 mg/dL — AB (ref 65–99)
GLUCOSE: 164 mg/dL — AB (ref 65–99)
Glucose, Bld: 120 mg/dL — ABNORMAL HIGH (ref 65–99)
POTASSIUM: 4.4 mmol/L (ref 3.5–5.1)
POTASSIUM: 4.6 mmol/L (ref 3.5–5.1)
POTASSIUM: 4.8 mmol/L (ref 3.5–5.1)
Sodium: 135 mmol/L (ref 135–145)
Sodium: 136 mmol/L (ref 135–145)
Sodium: 134 mmol/L — ABNORMAL LOW (ref 135–145)

## 2016-04-08 LAB — ECHOCARDIOGRAM COMPLETE
E/e' ratio: 4.82
EWDT: 303 ms
FS: 27 % — AB (ref 28–44)
Height: 76 in
IV/PV OW: 1.01
LA ID, A-P, ES: 32 mm
LA diam end sys: 32 mm
LA diam index: 1.45 cm/m2
LA vol A4C: 76.5 ml
LV E/e' medial: 4.82
LV E/e'average: 4.82
LV PW d: 14.1 mm — AB (ref 0.6–1.1)
LV TDI E'LATERAL: 14.6
LV TDI E'MEDIAL: 7.94
LV e' LATERAL: 14.6 cm/s
LVOT VTI: 16.1 cm
LVOT area: 3.46 cm2
LVOT peak vel: 95 cm/s
LVOTD: 21 mm
LVOTSV: 56 mL
Lateral S' vel: 11.2 cm/s
MV Dec: 303
MV pk A vel: 68.4 m/s
MV pk E vel: 70.3 m/s
RV TAPSE: 9.57 mm
Weight: 3156.99 oz

## 2016-04-08 LAB — GLUCOSE, CAPILLARY
GLUCOSE-CAPILLARY: 106 mg/dL — AB (ref 65–99)
GLUCOSE-CAPILLARY: 93 mg/dL (ref 65–99)
GLUCOSE-CAPILLARY: 102 mg/dL — AB (ref 65–99)
Glucose-Capillary: 115 mg/dL — ABNORMAL HIGH (ref 65–99)
Glucose-Capillary: 82 mg/dL (ref 65–99)
Glucose-Capillary: 86 mg/dL (ref 65–99)

## 2016-04-08 LAB — LACTIC ACID, PLASMA: Lactic Acid, Venous: 1.7 mmol/L (ref 0.5–1.9)

## 2016-04-08 LAB — HEPATIC FUNCTION PANEL
ALT: 67 U/L — AB (ref 17–63)
AST: 73 U/L — ABNORMAL HIGH (ref 15–41)
Albumin: 3.7 g/dL (ref 3.5–5.0)
Alkaline Phosphatase: 103 U/L (ref 38–126)
BILIRUBIN DIRECT: 0.1 mg/dL (ref 0.1–0.5)
BILIRUBIN INDIRECT: 0.3 mg/dL (ref 0.3–0.9)
TOTAL PROTEIN: 5.4 g/dL — AB (ref 6.5–8.1)
Total Bilirubin: 0.4 mg/dL (ref 0.3–1.2)

## 2016-04-08 LAB — PROTIME-INR
INR: 1.04
PROTHROMBIN TIME: 13.6 s (ref 11.4–15.2)

## 2016-04-08 LAB — MAGNESIUM: MAGNESIUM: 2.8 mg/dL — AB (ref 1.7–2.4)

## 2016-04-08 LAB — C DIFFICILE QUICK SCREEN W PCR REFLEX
C Diff antigen: POSITIVE — AB
C Diff toxin: NEGATIVE

## 2016-04-08 LAB — PHOSPHORUS: PHOSPHORUS: 6.8 mg/dL — AB (ref 2.5–4.6)

## 2016-04-08 LAB — TROPONIN I

## 2016-04-08 LAB — CLOSTRIDIUM DIFFICILE BY PCR: Toxigenic C. Difficile by PCR: POSITIVE — AB

## 2016-04-08 LAB — MRSA PCR SCREENING: MRSA BY PCR: INVALID — AB

## 2016-04-08 MED ORDER — HEPARIN SODIUM (PORCINE) 5000 UNIT/ML IJ SOLN
5000.0000 [IU] | Freq: Two times a day (BID) | INTRAMUSCULAR | Status: DC
  Administered 2016-04-08 – 2016-04-13 (×10): 5000 [IU] via SUBCUTANEOUS
  Filled 2016-04-08 (×9): qty 1

## 2016-04-08 MED ORDER — DIPHENHYDRAMINE HCL 50 MG/ML IJ SOLN: 12.5000 mg | Freq: Three times a day (TID) | INTRAMUSCULAR | Status: DC | PRN

## 2016-04-08 MED ORDER — ACETAMINOPHEN 10 MG/ML IV SOLN
1000.0000 mg | Freq: Three times a day (TID) | INTRAVENOUS | Status: AC
  Administered 2016-04-09: 1000 mg via INTRAVENOUS
  Filled 2016-04-08 (×3): qty 100

## 2016-04-08 NOTE — Progress Notes (Signed)
Patient ID: [name], male   DOB: [birth year], 66 y.o.   MRN: [id number]   -Pt overall stable.   -Still with unchanged numbness of upper extremities from elbows distally bilaterally. I am still unclear as to etiology.  -He has had multiple BMs today per patient and wife.  Diet will be left to General Surgery's decision based on my discussion with Dr. Marcello Moores today. -He is still requiring phenylephrine gtt despite starting oral midodrine today and needs to remain in the ICU tonight. -I spoke with Dr. Joelyn Oms today regarding plan for dialysis.  Tentative plan was for him to be transferred to continued care at [facility] tomorrow to receive hemodialysis if he was able to come off IV pressor agent.  Currently, he has not been able to be weaned off and is not likely stable enough to leave ICU.  I have notified Dr. Pilar Jarvis (urologist on call this weekend) of the patients situation and asked him to facilitate a discussion on Saturday morning about the best plan with CCM and nephrology.  ? If he may be better off with CVVHD if still with labile blood pressure. -Ok to start DVT pharmacologic prophylaxis per Dr. Marcello Moores

## 2016-04-08 NOTE — Progress Notes (Signed)
Dr. Jimmy Footman made aware that pt has had no urine output for duration of shift and bladder scan found 0 mL of fluid in pt bladder. No new orders received. Will continue to monitor.

## 2016-04-08 NOTE — Progress Notes (Signed)
PULMONARY / CRITICAL CARE MEDICINE   Name: [name] MRN: [id number] DOB: 05/27/1950    ADMISSION DATE:  04/07/2016 CONSULTATION DATE:  04/07/2016   REFERRING MD:  Dr Alinda Money  CHIEF COMPLAINT:  Post op hypotension  Subjective Reports pain at 6 over 10; was 8  Objective  VITAL SIGNS: BP (!) 100/48   Pulse (!) 151   Temp 98.1 F (36.7 C) (Oral)   Resp 18   Ht 6\' 4"  (1.93 m)   Wt 197 lb 5 oz (89.5 kg)   SpO2 91%   BMI 24.02 kg/m   HEMODYNAMICS:    VENTILATOR SETTINGS:    INTAKE / OUTPUT: I/O last 3 completed shifts: In: 2701.8 [I.V.:1901.8; IV Piggyback:800] Out: 9562 [Urine:310; Drains:355; Blood:500]  PHYSICAL EXAMINATION: General:  Obese male, post op Neuro:  Drowsy. Oriented X 3. No focal def  HEENT:  O2, no neck nodes, + JVD Cardiovascular:  III/VI soft blowing murmur over aorto/pulmonic region.  Lungs:  CTA bilaterally, No distress. Pulse ox 100% room air  Abdomen:  Obese, soft, RUQ JP drain with frank blood but not oozing out Musculoskeletal:  No cyanosis. No clubbing. No edema Skin:  Intact anteriorly  LABS:  PULMONARY No results for input(s): PHART, PCO2ART, PO2ART, HCO3, TCO2, O2SAT in the last 168 hours.  Invalid input(s): PCO2, PO2  CBC  Recent Labs Lab 04/07/16 0634 04/07/16 1305 04/07/16 2002 04/08/16 0409  HGB 11.4* 11.1* 10.4* 9.7*  HCT 33.1* 33.7* 30.2* 27.6*  WBC 3.6*  --  10.3 9.2  PLT 112*  --  116* 119*    COAGULATION  Recent Labs Lab 04/08/16 0409  INR 1.04    CARDIAC    Recent Labs Lab 04/07/16 2002 04/08/16 0409  TROPONINI <0.03 <0.03   No results for input(s): PROBNP in the last 168 hours.   CHEMISTRY  Recent Labs Lab 04/07/16 0634 04/07/16 1305 04/07/16 2002 04/08/16 0021 04/08/16 0409  NA 134* 132* 135 135 136  K 4.4 4.0 4.4 4.6 4.8  CL 92* 94* 96* 97* 96*  CO2 30 22 23 27 25   GLUCOSE 90 156* 189* 164* 125*  BUN 44* 48* 53* 55* 62*  CREATININE 4.36* 4.70* 5.14* 5.11* 5.94*  CALCIUM 9.6  8.9 9.1 9.0 9.0  MG  --   --   --   --  2.8*  PHOS  --   --   --   --  6.8*   Estimated Creatinine Clearance: 15 mL/min (by C-G formula based on SCr of 5.94 mg/dL (H)).   LIVER  Recent Labs Lab 04/08/16 0409  AST 73*  ALT 67*  ALKPHOS 103  BILITOT 0.4  PROT 5.4*  ALBUMIN 3.7  INR 1.04     INFECTIOUS  Recent Labs Lab 04/07/16 2002 04/08/16 0409  LATICACIDVEN 2.0* 1.7     ENDOCRINE CBG (last 3)   Recent Labs  04/08/16 0254 04/08/16 0256 04/08/16 0808  GLUCAP 106* 115* 82         IMAGING x48h  - image(s) personally visualized  -   highlighted in bold No results found.      ASSESSMENT / PLAN:  PULMONARY A: No resp distress but at risk for post op hypercapnia due to ESRD + opioids for pain  P:   - keep pulse ox > 88% - avoid xs 02 - end tidal co2 monitoring per PCA protocol    CARDIOVASCULAR A:  - Baseline - sbp 90s with midorine - Post op circulatory shock needing neo -  likely volume shifts and reflective underlying chronic hypotension - heart murmur. III/VI soft blowing murmur mid-aorto-pulmonic region (he states he's been told this before but this is not documented in any of the previous assessments either from here or [facility] that we reviewed) P:  Adding back midodrine Wean neo for sbp goal > 90 Repeat ECHO  RENAL A:   esrd  P:   Per renal-->usually gets HD MWF.  Renal may consider CRRT x 24 hrs given boarderline BP  Will get CXR to r/o edema. That would also help decide on timing and need for HD  GASTROINTESTINAL A:   S/p r hemicolectomy due to renal cancer adherent with colon  04/07/2016 Post op JP drai with some blood  P:   Per CCS  HEMATOLOGIC A:   At risk for anemia of bleeding v critical illness v both P:  Monitor  Transfuse per ICU protocol   INFECTIOUS A:   No evidence of acute infection but at risk P:   monitor  ENDOCRINE A:   DM   P:   ssi  NEUROLOGIC A:   Post op pain - unable to do PCA. Currently  drowsy after dilaudid  P:   Change dilaudid to fentanyl prn in setting of ESRD EtCo2 monitoring RASS goal: o t0 -2    FAMILY  - Updates: wife and son updated at bedside 04/07/2016   - Inter-disciplinary family meet or Palliative Care meeting due by1/11/18   My ccm time 32 minutes.   Erick Colace ACNP-BC [place] Pager # [phone] OR # [phone] if no answer    04/08/2016 9:02 AM

## 2016-04-08 NOTE — Progress Notes (Addendum)
Patient ID: [name], male   DOB: [birth year], 66 y.o.   MRN: [id number]  1 Day Post-Op Subjective: Pt now awake and alert.  He remains in the ICU on phenylephrine gtt at a fairly stable rate compared to yesterday to maintain his baseline SBP of around 90.  He has mild nausea but denies vomiting.  Pain in relatively well controlled.  Per nursing, patient has not required IV pain meds overnight.  He does complain of bilateral decreased sensation in his upper extremities from his elbow to his hands.  This is not in any neurologic distribution.  Nursing staff brought up concern about palpable pulsatile abdominal mass.  I reassured nursing that abdominal aorta is very palpable at baseline but he has not had evidence of AAA by recent abdominal imaging nor intraoperatively.  Objective: Vital signs in last 24 hours: Temp:  [97.3 F (36.3 C)-98.4 F (36.9 C)] 98.1 F (36.7 C) (01/05 0400) Pulse Rate:  [64-74] 65 (01/05 0600) Resp:  [11-23] 15 (01/05 0600) BP: (72-133)/(37-67) 91/53 (01/05 0630) SpO2:  [94 %-100 %] 94 % (01/05 0600) Weight:  [89.5 kg (197 lb 5 oz)] 89.5 kg (197 lb 5 oz) (01/04 1350)  Intake/Output from previous day: 01/04 0701 - 01/05 0700 In: 2646.2 [I.V.:1846.2; IV Piggyback:800] Out: 7253 [Urine:310; Drains:355; Blood:500] Intake/Output this shift: No intake/output data recorded.  Physical Exam:  General: Alert and oriented CV: RRR Lungs: Clear Abdomen: Soft, ND, minimal bowel sounds, drain with expected serosanguineous fluid  Incisions: Dressing intact, wound without erythema or drainage Ext: NT, No erythema Neuro: He has decreased sensation below level of elbows on both upper extremities.  This is not in a median nerve nor ulnar nerve distribution.  He has appropriate grip strength bilaterally.  Lab Results:  Recent Labs  04/07/16 1305 04/07/16 2002 04/08/16 0409  HGB 11.1* 10.4* 9.7*  HCT 33.7* 30.2* 27.6*   CBC Latest Ref Rng & Units 04/08/2016 04/07/2016  04/07/2016  WBC 4.0 - 10.5 K/uL 9.2 10.3 -  Hemoglobin 13.0 - 17.0 g/dL 9.7(L) 10.4(L) 11.1(L)  Hematocrit 39.0 - 52.0 % 27.6(L) 30.2(L) 33.7(L)  Platelets 150 - 400 K/uL 119(L) 116(L) -     BMET  Recent Labs  04/08/16 0021 04/08/16 0409  NA 135 136  K 4.6 4.8  CL 97* 96*  CO2 27 25  GLUCOSE 164* 125*  BUN 55* 62*  CREATININE 5.11* 5.94*  CALCIUM 9.0 9.0     Studies/Results: No results found.  Assessment/Plan: POD #1 s/p exploratory laparotomy with resection of retroperitoneal mass (confirmed to be clear cell carcinoma by biopsy) requiring partial liver resection and right colectomy.  1) CV: He remains on IV gtt phenylephrine to maintain BP.  He has not yet received oral midodrine and will plan to restart that today to see if phenylephrine can be titrated off.  Will continue to rely on CCM to manage this and greatly appreciate their excellent care. 2) Renal: No urgent need for HD this morning based on labs but patient typically would receive HD today.  Appreciate Dr. Adin Hector excellent care and will await nephrology input about dialysis today.  Considering hemodynamic instability, Dr. Marylou Flesher is considering CVVHD as a possibility.  Continue to monitor electrolytes closely. 3) FEN: Will begin clear liquid diet today assuming ok with Dr. Marcello Moores.  Continue SSI. 4) ID: Patient noted to have small intra-abdominal abscess intraoperatively which is likely related to prior severe infection after nephrectomy last year.  This may prove to be a small sterile  abscess but he is on cefoxitin and wound culture is pending. Continue drain. 5) Post-op care: Hgb relatively stable. Will start clear liquid diet and begin OOB once HD status improved.  Will discuss with Dr. Marcello Moores when it may be reasonable to restart pharmacologic DVT prophylaxis considering partial liver resection yesterday.  Will continue to try to minimize IV narcotics to decrease risk of hypotension and constipation (which has been  severe issue for him in the past).  Start IV tylenol for the next 24 hours until he is known to be tolerating po intake well. 6) Neuro: Unclear etiology for patient's upper extremity numbness.  I do not suspect neurologic compromise considering no clear neurologic distribution of symptoms and patient was positioned in a very neutral position during operation.  ? Related to poor perfusion related to HD stability.  Will continue to monitor and will ask OT to see patient to assist with functional deficits related to this issue. 7) Oncology: Pathology pending. 8) Disp: He will likely remain in the ICU today considering need for IV pressor agent.   LOS: 1 day   Geremiah Fussell,LES 04/08/2016, 7:26 AM

## 2016-04-08 NOTE — Progress Notes (Signed)
  Echocardiogram 2D Echocardiogram has been performed.  [name] 04/08/2016, 11:48 AM

## 2016-04-08 NOTE — Progress Notes (Signed)
1 Day Post-Op  Subjective: Very tired and on PCA, having some nausea, no NG, No vomiting.  Incision looks fine.  Objective: Vital signs in last 24 hours: Temp:  [97.3 F (36.3 C)-98.4 F (36.9 C)] 98.1 F (36.7 C) (01/05 0800) Pulse Rate:  [64-74] 64 (01/05 0730) Resp:  [11-23] 20 (01/05 0730) BP: (72-133)/(37-67) 90/51 (01/05 0730) SpO2:  [94 %-100 %] 97 % (01/05 0730) Weight:  [89.5 kg (197 lb 5 oz)] 89.5 kg (197 lb 5 oz) (01/04 1350) Last BM Date: 04/08/16 2700IV Urine 31 drain 355 Afebrile, BP still down on pressor Intake/Output from previous day: 01/04 0701 - 01/05 0700 In: 2701.8 [I.V.:1901.8; IV Piggyback:800] Out: 7412 [Urine:310; Drains:355; Blood:500] Intake/Output this shift: No intake/output data recorded.  General appearance: Tired and mildly sedated.   GI: soft, not distended No BS, incision OK  Lab Results:   Recent Labs  04/07/16 2002 04/08/16 0409  WBC 10.3 9.2  HGB 10.4* 9.7*  HCT 30.2* 27.6*  PLT 116* 119*    BMET  Recent Labs  04/08/16 0021 04/08/16 0409  NA 135 136  K 4.6 4.8  CL 97* 96*  CO2 27 25  GLUCOSE 164* 125*  BUN 55* 62*  CREATININE 5.11* 5.94*  CALCIUM 9.0 9.0   PT/INR  Recent Labs  04/08/16 0409  LABPROT 13.6  INR 1.04     Recent Labs Lab 04/08/16 0409  AST 73*  ALT 67*  ALKPHOS 103  BILITOT 0.4  PROT 5.4*  ALBUMIN 3.7     Lipase     Component Value Date/Time   LIPASE 28 11/01/2015 1812     Studies/Results: No results found.  Medications: . acetaminophen  1,000 mg Intravenous Q8H  . atorvastatin  10 mg Oral Daily  . calcitRIOL  0.25 mcg Oral Once per day on Mon Wed Fri  . cefOXitin  1 g Intravenous Q24H  . cinacalcet  90 mg Oral Q breakfast  . insulin aspart  0-15 Units Subcutaneous Q4H  . midodrine  10 mg Oral TID WC  . sevelamer carbonate  4,800 mg Oral TID WC   . sodium chloride 50 mL/hr at 04/08/16 0600  . phenylephrine (NEO-SYNEPHRINE) Adult infusion 14.933 mcg/min (04/08/16 0600)      Assessment/Plan Renal cell carcinoma S/p EXPLORATORY LAPAROTOMY AND RESECTION OF RETROPERITONEAL MASS RIGHT HEMICOLECTOMY, 04/07/16 S/p 1. Exploratory laparotomy, Resection of retroperitoneal mass, Right colectomy,  Liver resection 04/07/16, Dr. Raynelle Bring Kidney disease - creatinine up to 5.94 Hx of CAD FEN:  IV fluids/NPO ID: Pre op + Cefoxitin day 2 DVT:   SCD    Plan:  Bowel rest till bowel function returns     LOS: 1 day    [name] 04/08/2016 [phone]

## 2016-04-08 NOTE — Progress Notes (Addendum)
Admit: 04/07/2016 LOS: 1  3M ESRD s/p resection of retroperitoneal mass and R hemicolectomy 04/07/16 for recurrent RCC  Subjective:  Remains on pressors AAO to self, location, year Labs stable  01/04 0701 - 01/05 0700 In: 2701.8 [I.V.:1901.8; IV Piggyback:800] Out: 3570 [Urine:310; Drains:355; Blood:500]  Filed Weights   04/07/16 0627 04/07/16 1350  Weight: 90.7 kg (200 lb) 89.5 kg (197 lb 5 oz)    Scheduled Meds: . acetaminophen  1,000 mg Intravenous Q8H  . atorvastatin  10 mg Oral Daily  . calcitRIOL  0.25 mcg Oral Once per day on Mon Wed Fri  . cefOXitin  1 g Intravenous Q24H  . cinacalcet  90 mg Oral Q breakfast  . insulin aspart  0-15 Units Subcutaneous Q4H  . midodrine  10 mg Oral TID WC  . sevelamer carbonate  4,800 mg Oral TID WC   Continuous Infusions: . sodium chloride 50 mL/hr at 04/08/16 0600  . phenylephrine (NEO-SYNEPHRINE) Adult infusion 12 mcg/min (04/08/16 1200)   PRN Meds:.fentaNYL (SUBLIMAZE) injection, ondansetron, sevelamer carbonate  Current Labs: reviewed    Physical Exam:  Blood pressure (!) 83/42, pulse 68, temperature 98.1 F (36.7 C), temperature source Oral, resp. rate 16, height 6\' 4"  (1.93 m), weight 89.5 kg (197 lb 5 oz), SpO2 94 %. GEN: NAD ENT: NCAT, [place] in place EYES: Eyes closed, blind CV: RRR PULM: CTAB, ant auscultation ABD: RUQ incision dressed SKIN: no rashes/lesions; R IJ TDC C/D/I EXT:no edema LUE AVF +B/T, maturing nicely  A 1. ESRD 2. Recurrent RCC s/p resection, R hemicolectomy 04/08/15 3. CAD s/p CABG 4. LUE AVF 5. Anemia 6. 2HPTH  P 1. Labs stable, at outpt EDW and NPO so don't expect any sudden changes in blood work 2. Would prefer to try iHD at [facility], will discuss with Dr. Alinda Money, probably would do 04/10/15  Update: tentatively plan for weening pressors today and transition to [facility] tomorrow for HD. BID labs today.   Pearson Grippe MD 04/08/2016, 12:17 PM   Recent Labs Lab 04/07/16 2002 04/08/16 0021 04/08/16 0409   NA 135 135 136  K 4.4 4.6 4.8  CL 96* 97* 96*  CO2 23 27 25   GLUCOSE 189* 164* 125*  BUN 53* 55* 62*  CREATININE 5.14* 5.11* 5.94*  CALCIUM 9.1 9.0 9.0  PHOS  --   --  6.8*    Recent Labs Lab 04/07/16 0634 04/07/16 1305 04/07/16 2002 04/08/16 0409  WBC 3.6*  --  10.3 9.2  NEUTROABS  --   --  9.4* 8.2*  HGB 11.4* 11.1* 10.4* 9.7*  HCT 33.1* 33.7* 30.2* 27.6*  MCV 101.5*  --  101.7* 100.0  PLT 112*  --  116* 119*

## 2016-04-09 DIAGNOSIS — A0472 Enterocolitis due to Clostridium difficile, not specified as recurrent: Secondary | ICD-10-CM

## 2016-04-09 LAB — CBC WITH DIFFERENTIAL/PLATELET
Basophils Absolute: 0 10*3/uL (ref 0.0–0.1)
Basophils Relative: 0 %
EOS PCT: 1 %
Eosinophils Absolute: 0 10*3/uL (ref 0.0–0.7)
HCT: 25.2 % — ABNORMAL LOW (ref 39.0–52.0)
Hemoglobin: 8.6 g/dL — ABNORMAL LOW (ref 13.0–17.0)
LYMPHS ABS: 0.3 10*3/uL — AB (ref 0.7–4.0)
LYMPHS PCT: 5 %
MCH: 34.8 pg — AB (ref 26.0–34.0)
MCHC: 34.1 g/dL (ref 30.0–36.0)
MCV: 102 fL — AB (ref 78.0–100.0)
MONO ABS: 0.5 10*3/uL (ref 0.1–1.0)
MONOS PCT: 9 %
Neutro Abs: 4.8 10*3/uL (ref 1.7–7.7)
Neutrophils Relative %: 85 %
PLATELETS: 89 10*3/uL — AB (ref 150–400)
RBC: 2.47 MIL/uL — AB (ref 4.22–5.81)
RDW: 14.8 % (ref 11.5–15.5)
WBC: 5.6 10*3/uL (ref 4.0–10.5)

## 2016-04-09 LAB — GLUCOSE, CAPILLARY
GLUCOSE-CAPILLARY: 76 mg/dL (ref 65–99)
GLUCOSE-CAPILLARY: 78 mg/dL (ref 65–99)
Glucose-Capillary: 100 mg/dL — ABNORMAL HIGH (ref 65–99)
Glucose-Capillary: 65 mg/dL (ref 65–99)
Glucose-Capillary: 69 mg/dL (ref 65–99)
Glucose-Capillary: 85 mg/dL (ref 65–99)
Glucose-Capillary: 90 mg/dL (ref 65–99)
Glucose-Capillary: 93 mg/dL (ref 65–99)

## 2016-04-09 LAB — BASIC METABOLIC PANEL
Anion gap: 14 (ref 5–15)
BUN: 73 mg/dL — AB (ref 6–20)
CALCIUM: 8.4 mg/dL — AB (ref 8.9–10.3)
CO2: 24 mmol/L (ref 22–32)
CREATININE: 7.25 mg/dL — AB (ref 0.61–1.24)
Chloride: 96 mmol/L — ABNORMAL LOW (ref 101–111)
GFR calc Af Amer: 8 mL/min — ABNORMAL LOW (ref >60)
GFR calc non Af Amer: 7 mL/min — ABNORMAL LOW (ref >60)
GLUCOSE: 79 mg/dL (ref 65–99)
Potassium: 4.5 mmol/L (ref 3.5–5.1)
Sodium: 134 mmol/L — ABNORMAL LOW (ref 135–145)

## 2016-04-09 LAB — MAGNESIUM: Magnesium: 2.8 mg/dL — ABNORMAL HIGH (ref 1.7–2.4)

## 2016-04-09 LAB — PHOSPHORUS: Phosphorus: 7.4 mg/dL — ABNORMAL HIGH (ref 2.5–4.6)

## 2016-04-09 MED ORDER — DEXTROSE 50 % IV SOLN
INTRAVENOUS | Status: AC
  Administered 2016-04-09 (×2): 25 mL
  Filled 2016-04-09: qty 50

## 2016-04-09 MED ORDER — DEXTROSE 50 % IV SOLN
INTRAVENOUS | Status: AC
  Filled 2016-04-09: qty 50

## 2016-04-09 MED ORDER — METRONIDAZOLE 500 MG PO TABS
500.0000 mg | ORAL_TABLET | Freq: Three times a day (TID) | ORAL | Status: DC
  Administered 2016-04-09 – 2016-04-11 (×5): 500 mg via ORAL
  Filled 2016-04-09 (×5): qty 1

## 2016-04-09 MED ORDER — DEXTROSE 10 % IV SOLN
INTRAVENOUS | Status: DC
  Administered 2016-04-09: 1000 mL via INTRAVENOUS
  Administered 2016-04-10: 19:00:00 via INTRAVENOUS

## 2016-04-09 NOTE — Progress Notes (Addendum)
No events overnight Patient is off neo drip. SBP>90 No n/v +BM/flatus Advanced to sips and PO meds per surgery Pain improved  Vitals:   04/09/16 0800 04/09/16 0900 04/09/16 1000 04/09/16 1100  BP: (!) 95/46 (!) 96/45 (!) 96/45 (!) 124/57  Pulse: 61 63 62 66  Resp: 14 15 13 16   Temp: 98.2 F (36.8 C)     TempSrc: Oral     SpO2: 98% 98% 98% 98%  Weight:      Height:       I/O last 3 completed shifts: In: 2308.8 [I.V.:1958.8; Other:150; IV Piggyback:200] Out: 165 [Drains:165] Total I/O In: 200 [I.V.:200] Out: 35 [Drains:35]   NAD Soft approp T mild D Inc c/d/i JP SS  CBC    Component Value Date/Time   WBC 5.6 04/09/2016 0321   RBC 2.47 (L) 04/09/2016 0321   HGB 8.6 (L) 04/09/2016 0321   HCT 25.2 (L) 04/09/2016 0321   HCT 25.7 (L) 11/03/2015 0553   PLT 89 (L) 04/09/2016 0321   MCV 102.0 (H) 04/09/2016 0321   MCH 34.8 (H) 04/09/2016 0321   MCHC 34.1 04/09/2016 0321   RDW 14.8 04/09/2016 0321   LYMPHSABS 0.3 (L) 04/09/2016 0321   MONOABS 0.5 04/09/2016 0321   EOSABS 0.0 04/09/2016 0321   BASOSABS 0.0 04/09/2016 0321   BMP Latest Ref Rng & Units 04/09/2016 04/08/2016 04/08/2016  Glucose 65 - 99 mg/dL 79 120(H) 125(H)  BUN 6 - 20 mg/dL 73(H) 65(H) 62(H)  Creatinine 0.61 - 1.24 mg/dL 7.25(H) 5.83(H) 5.94(H)  Sodium 135 - 145 mmol/L 134(L) 134(L) 136  Potassium 3.5 - 5.1 mmol/L 4.5 4.4 4.8  Chloride 101 - 111 mmol/L 96(L) 98(L) 96(L)  CO2 22 - 32 mmol/L 24 24 25   Calcium 8.9 - 10.3 mg/dL 8.4(L) 8.2(L) 9.0  ;  POD #2 s/p exploratory laparotomy with resection of retroperitoneal mass (confirmed to be clear cell carcinoma by biopsy) requiring partial liver resection and right colectomy. -Hypotension - off neo. Continue midodrine.  -HD - ok to transfer to [facility] today (discussed with ICU and nephrology). Will attempt transfer at this time though beds are limited. Plan for HD today -NPO with sips/meds per gen surg. Adv per gen surger -Hg - slight downward trend. Will continue  to monitor. -C. Diff positive toxin with no toxin production - colonization. No treatment per discussion with intesivist -continue pain control -d/c foley

## 2016-04-09 NOTE — Progress Notes (Signed)
PULMONARY / CRITICAL CARE MEDICINE   Name: [name] MRN: [id number] DOB: 1950/07/26    ADMISSION DATE:  04/07/2016 CONSULTATION DATE:  04/07/2016   REFERRING MD:  Dr Alinda Money  CHIEF COMPLAINT:  Post op hypotension  Mr. Faucett had a resection of a retroperitoneal mass with partial liver resection and R colectomy on 1/4 and PCCM was consulted for post op hypotension. He has ESRD &  takes midodrine at home to maintain his BP > 90 systolic.   Subjective Pain better No diarrhea  Objective  VITAL SIGNS: BP (!) 96/45   Pulse 63   Temp 98.2 F (36.8 C) (Oral)   Resp 15   Ht 6\' 4"  (1.93 m)   Wt 197 lb 5 oz (89.5 kg)   SpO2 98%   BMI 24.02 kg/m   HEMODYNAMICS:    VENTILATOR SETTINGS:    INTAKE / OUTPUT: I/O last 3 completed shifts: In: 2308.8 [I.V.:1958.8; Other:150; IV Piggyback:200] Out: 165 [Drains:165]  PHYSICAL EXAMINATION: General:  Obese male, post op Neuro:  Drowsy. Oriented X 3. No focal def  HEENT:  O2, no neck nodes, + JVD Cardiovascular:  III/VI soft blowing murmur over aorto/pulmonic region.  Lungs:  CTA bilaterally, No distress.  Abdomen:  Obese, soft, RUQ JP drain with frank blood but not oozing out Musculoskeletal:  No cyanosis. No clubbing. No edema Skin:  Intact anteriorly  LABS:  PULMONARY No results for input(s): PHART, PCO2ART, PO2ART, HCO3, TCO2, O2SAT in the last 168 hours.  Invalid input(s): PCO2, PO2  CBC  Recent Labs Lab 04/07/16 2002 04/08/16 0409 04/09/16 0321  HGB 10.4* 9.7* 8.6*  HCT 30.2* 27.6* 25.2*  WBC 10.3 9.2 5.6  PLT 116* 119* 89*    COAGULATION  Recent Labs Lab 04/08/16 0409  INR 1.04    CARDIAC    Recent Labs Lab 04/07/16 2002 04/08/16 0409  TROPONINI <0.03 <0.03   No results for input(s): PROBNP in the last 168 hours.   CHEMISTRY  Recent Labs Lab 04/07/16 2002 04/08/16 0021 04/08/16 0409 04/08/16 1624 04/09/16 0321 04/09/16 0341  NA 135 135 136 134*  --  134*  K 4.4 4.6 4.8 4.4   --  4.5  CL 96* 97* 96* 98*  --  96*  CO2 23 27 25 24   --  24  GLUCOSE 189* 164* 125* 120*  --  79  BUN 53* 55* 62* 65*  --  73*  CREATININE 5.14* 5.11* 5.94* 5.83*  --  7.25*  CALCIUM 9.1 9.0 9.0 8.2*  --  8.4*  MG  --   --  2.8*  --  2.8*  --   PHOS  --   --  6.8*  --  7.4*  --    Estimated Creatinine Clearance: 12.3 mL/min (by C-G formula based on SCr of 7.25 mg/dL (H)).   LIVER  Recent Labs Lab 04/08/16 0409  AST 73*  ALT 67*  ALKPHOS 103  BILITOT 0.4  PROT 5.4*  ALBUMIN 3.7  INR 1.04     INFECTIOUS  Recent Labs Lab 04/07/16 2002 04/08/16 0409  LATICACIDVEN 2.0* 1.7     ENDOCRINE CBG (last 3)   Recent Labs  04/09/16 0044 04/09/16 0328 04/09/16 0748  GLUCAP 85 76 78         IMAGING x48h  - image(s) personally visualized  -   highlighted in bold Dg Chest Port 1 View  Result Date: 04/08/2016 CLINICAL DATA:  Pulmonary edema EXAM: PORTABLE CHEST 1 VIEW COMPARISON:  02/15/2016  FINDINGS: Cardiomediastinal silhouette is stable. Status post median sternotomy. Right IJ catheter with tip in SVC right atrium junction. No pneumothorax. Chronic elevation of the right hemidiaphragm with right basilar atelectasis. No infiltrate or pulmonary edema. Skin staples are noted in right upper abdomen. Postsurgical drain and postsurgical changes are noted in right upper quadrant. IMPRESSION: No infiltrate or pulmonary edema. Chronic elevation of the right hemidiaphragm with right basilar atelectasis. Electronically Signed   By: Lahoma Crocker M.D.   On: 04/08/2016 09:52        ASSESSMENT / PLAN:  PULMONARY A: No resp distress but at risk for post op hypercapnia due to ESRD + opioids for pain  P:   - keep pulse ox > 88% - avoid xs 02 - end tidal co2 monitoring per PCA protocol    CARDIOVASCULAR A:  - Baseline - sbp 90s with midorine - Post op circulatory shock needing neo - likely volume shifts and reflective underlying chronic hypotension - heart murmur. III/VI  soft blowing murmur mid-aorto-pulmonic region (he states he's been told this before but this is not documented in any of the previous assessments either from here or [facility] that we reviewed) echo - nml LV fn, no valvular stenosis P:  ct midodrine Off  Neo x 12h   RENAL A:   esrd  P:   Per renal-->usually gets HD MWF, can transfer to [facility] for iHD now  GASTROINTESTINAL A:   S/p r hemicolectomy due to renal cancer adherent with colon  04/07/2016 Post op JP drai with some blood  P:   Per CCS  HEMATOLOGIC A:   At risk for anemia of bleeding v critical illness v both P:  Monitor  Transfuse per ICU protocol   INFECTIOUS A:   C diff antigen +, toxin neg P:   Per guideline, Patient is colonized with non-toxigenic C. difficile. May not need treatment unless significant symptoms are present Flagyl only if he develops diarrhea, leucocytosis  ENDOCRINE A:   DM   P:   ssi  NEUROLOGIC A:   Post op pain - unable to do PCA. Currently drowsy after dilaudid  P:   Change dilaudid to fentanyl prn in setting of ESRD EtCo2 monitoring     FAMILY  - Updates: wife and son updated at bedside 04/07/2016   - Inter-disciplinary family meet or Palliative Care meeting due by- NA   Can transfer to [facility] under hospitalist service for dialysis -defer to renal & urology PCCM to sign off   Kara Mead MD. [facility]. [place] [facility] Pager [phone] If no response call [phone]    04/09/2016 10:49 AM

## 2016-04-09 NOTE — Progress Notes (Signed)
eLink Physician-Brief Progress Note Patient Name: [name] DOB: [birth year] MRN: [id number]   Date of Service  04/09/2016  HPI/Events of Note  RN now reports 8-9 liquid stools , not charted Low sugars  eICU Interventions  Treat for C diff with PO flagyl D10@ 40/h     Intervention Category Major Interventions: Infection - evaluation and management Intermediate Interventions: Other:  Tracye Szuch V. 04/09/2016, 4:05 PM

## 2016-04-09 NOTE — Progress Notes (Signed)
2 Days Post-Op  Subjective: Denies nausea, has had some lower GI function with flatus and Bm. Still on neo.   Objective: Vital signs in last 24 hours: Temp:  [97.8 F (36.6 C)-99.3 F (37.4 C)] 97.8 F (36.6 C) (01/06 0400) Pulse Rate:  [29-68] 64 (01/06 0500) Resp:  [12-28] 14 (01/06 0500) BP: (77-120)/(37-57) 99/40 (01/06 0500) SpO2:  [89 %-100 %] 98 % (01/06 0500) Last BM Date: 04/08/16  Intake/Output from previous day: 01/05 0701 - 01/06 0700 In: 1502.3 [I.V.:1202.3; IV Piggyback:150] Out: 60 [Drains:60] Intake/Output this shift: No intake/output data recorded.  General appearance: Tired and mildly sedated.   GI: soft, not distended No BS, incision OK  Lab Results:   Recent Labs  04/08/16 0409 04/09/16 0321  WBC 9.2 5.6  HGB 9.7* 8.6*  HCT 27.6* 25.2*  PLT 119* 89*    BMET  Recent Labs  04/08/16 0409 04/08/16 1624  NA 136 134*  K 4.8 4.4  CL 96* 98*  CO2 25 24  GLUCOSE 125* 120*  BUN 62* 65*  CREATININE 5.94* 5.83*  CALCIUM 9.0 8.2*   PT/INR  Recent Labs  04/08/16 0409  LABPROT 13.6  INR 1.04     Recent Labs Lab 04/08/16 0409  AST 73*  ALT 67*  ALKPHOS 103  BILITOT 0.4  PROT 5.4*  ALBUMIN 3.7     Lipase     Component Value Date/Time   LIPASE 28 11/01/2015 1812     Studies/Results: Dg Chest Port 1 View  Result Date: 04/08/2016 CLINICAL DATA:  Pulmonary edema EXAM: PORTABLE CHEST 1 VIEW COMPARISON:  02/15/2016 FINDINGS: Cardiomediastinal silhouette is stable. Status post median sternotomy. Right IJ catheter with tip in SVC right atrium junction. No pneumothorax. Chronic elevation of the right hemidiaphragm with right basilar atelectasis. No infiltrate or pulmonary edema. Skin staples are noted in right upper abdomen. Postsurgical drain and postsurgical changes are noted in right upper quadrant. IMPRESSION: No infiltrate or pulmonary edema. Chronic elevation of the right hemidiaphragm with right basilar atelectasis. Electronically  Signed   By: Lahoma Crocker M.D.   On: 04/08/2016 09:52    Medications: . atorvastatin  10 mg Oral Daily  . calcitRIOL  0.25 mcg Oral Once per day on Mon Wed Fri  . cefOXitin  1 g Intravenous Q24H  . cinacalcet  90 mg Oral Q breakfast  . heparin subcutaneous  5,000 Units Subcutaneous Q12H  . insulin aspart  0-15 Units Subcutaneous Q4H  . midodrine  10 mg Oral TID WC  . sevelamer carbonate  4,800 mg Oral TID WC   . sodium chloride 50 mL/hr at 04/08/16 0600  . phenylephrine (NEO-SYNEPHRINE) Adult infusion Stopped (04/09/16 0100)     Assessment/Plan Renal cell carcinoma S/p EXPLORATORY LAPAROTOMY AND RESECTION OF RETROPERITONEAL MASS RIGHT HEMICOLECTOMY, 04/07/16 S/p 1. Exploratory laparotomy, Resection of retroperitoneal mass, Right colectomy,  Liver resection 04/07/16, Dr. Raynelle Bring Kidney disease - HD planned when off pressor Hx of CAD FEN:  IV fluids/NPO ID: Pre op + Cefoxitin day 2. For some reason had a C. Diff sent last night which was pos; "Positive for toxigenic C. difficile with little to no toxin production. Only treat if clinical presentation suggests symptomatic illness. "  Not sure why that was performed.  DVT:   SCD. OK to start chemical prophylaxis at discretion of primary team.    Plan: His only bowel function recorded was the evening of the 4th; not recorded yesterday or last night. OK to let him have limited  sips and PO meds but given his hypotension and overall picture would stay there until he has more bowel function.      LOS: 2 days    [name] 04/09/2016 [phone]

## 2016-04-09 NOTE — Progress Notes (Signed)
FENTANYL 50MG  WASTED BY [facility] AND [facility].

## 2016-04-09 NOTE — Progress Notes (Signed)
Admit: 04/07/2016 LOS: 2  60M ESRD s/p resection of retroperitoneal mass and R hemicolectomy 04/07/16 for recurrent RCC  Subjective:  Off pressors Labs still ok AAO, wife updated by phone D/w Urology  01/05 0701 - 01/06 0700 In: 1602.3 [I.V.:1302.3; IV Piggyback:150] Out: 60 [Drains:60]  Filed Weights   04/07/16 0627 04/07/16 1350  Weight: 90.7 kg (200 lb) 89.5 kg (197 lb 5 oz)    Scheduled Meds: . atorvastatin  10 mg Oral Daily  . calcitRIOL  0.25 mcg Oral Once per day on Mon Wed Fri  . cefOXitin  1 g Intravenous Q24H  . cinacalcet  90 mg Oral Q breakfast  . heparin subcutaneous  5,000 Units Subcutaneous Q12H  . insulin aspart  0-15 Units Subcutaneous Q4H  . midodrine  10 mg Oral TID WC  . sevelamer carbonate  4,800 mg Oral TID WC   Continuous Infusions: . sodium chloride 50 mL/hr at 04/08/16 0600  . phenylephrine (NEO-SYNEPHRINE) Adult infusion Stopped (04/09/16 0100)   PRN Meds:.diphenhydrAMINE, fentaNYL (SUBLIMAZE) injection, ondansetron, sevelamer carbonate  Current Labs: reviewed    Physical Exam:  Blood pressure (!) 124/57, pulse 66, temperature 98.2 F (36.8 C), temperature source Oral, resp. rate 16, height 6\' 4"  (1.93 m), weight 89.5 kg (197 lb 5 oz), SpO2 98 %. GEN: NAD ENT: NCAT, [place] in place EYES: Eyes closed, blind CV: RRR PULM: CTAB, ant auscultation ABD: RUQ incision dressed SKIN: no rashes/lesions; R IJ TDC C/D/I EXT:no edema LUE AVF +B/T, maturing nicely  A 1. ESRD 2. Recurrent RCC s/p resection, R hemicolectomy 04/08/15 3. CAD s/p CABG 4. LUE AVF 5. Anemia 6. 2HPTH  P 1. HD today after transfer to [facility]: 3h, 3K, 1L UF, no heparin, 300/600 use TDC 2. Cont midodrine 3. Wife updated by phone  Pearson Grippe MD 04/09/2016, 11:35 AM   Recent Labs Lab 04/08/16 0409 04/08/16 1624 04/09/16 0321 04/09/16 0341  NA 136 134*  --  134*  K 4.8 4.4  --  4.5  CL 96* 98*  --  96*  CO2 25 24  --  24  GLUCOSE 125* 120*  --  79  BUN 62* 65*  --  73*   CREATININE 5.94* 5.83*  --  7.25*  CALCIUM 9.0 8.2*  --  8.4*  PHOS 6.8*  --  7.4*  --     Recent Labs Lab 04/07/16 2002 04/08/16 0409 04/09/16 0321  WBC 10.3 9.2 5.6  NEUTROABS 9.4* 8.2* 4.8  HGB 10.4* 9.7* 8.6*  HCT 30.2* 27.6* 25.2*  MCV 101.7* 100.0 102.0*  PLT 116* 119* 89*

## 2016-04-09 NOTE — Progress Notes (Signed)
Transferred in from [facility]-stepdown via Care [facility].Blind both on his eyes.S/p right nephrectomy,dressing dry and bulky with light dry stained blood,noted a jp bulb with a bright red blood draining.Right foot partially amputated.Stage I along inner part all the way up on both buttock -redness non blanchable,thick cream applied.Skin assessed with Wells Guiles.Placed on telemetry,called and verified.Hemodialysis made aware that patient arrived on the floor.

## 2016-04-09 NOTE — Progress Notes (Signed)
OT Cancellation Note  Patient Details Name: ZEFERINO MOUNTS MRN: [id number] DOB: 09-15-1950   Cancelled Treatment:    Reason Eval/Treat Not Completed: Other (comment)  Pt getting ready to be transferred to [facility].  Will check on as schedule allows  Kari Baars, [place] Payton Mccallum D 04/09/2016, 4:09 PM

## 2016-04-10 LAB — CBC WITH DIFFERENTIAL/PLATELET
BASOS PCT: 0 %
Basophils Absolute: 0 10*3/uL (ref 0.0–0.1)
EOS ABS: 0.1 10*3/uL (ref 0.0–0.7)
Eosinophils Relative: 2 %
HEMATOCRIT: 23.6 % — AB (ref 39.0–52.0)
Hemoglobin: 8.1 g/dL — ABNORMAL LOW (ref 13.0–17.0)
Lymphocytes Relative: 11 %
Lymphs Abs: 0.4 10*3/uL — ABNORMAL LOW (ref 0.7–4.0)
MCH: 34.8 pg — ABNORMAL HIGH (ref 26.0–34.0)
MCHC: 34.3 g/dL (ref 30.0–36.0)
MCV: 101.3 fL — ABNORMAL HIGH (ref 78.0–100.0)
Monocytes Absolute: 0.3 10*3/uL (ref 0.1–1.0)
Monocytes Relative: 7 %
NEUTROS ABS: 2.9 10*3/uL (ref 1.7–7.7)
NEUTROS PCT: 81 %
Platelets: 73 10*3/uL — ABNORMAL LOW (ref 150–400)
RBC: 2.33 MIL/uL — AB (ref 4.22–5.81)
RDW: 14.3 % (ref 11.5–15.5)
WBC: 3.6 10*3/uL — AB (ref 4.0–10.5)

## 2016-04-10 LAB — MAGNESIUM: MAGNESIUM: 2.3 mg/dL (ref 1.7–2.4)

## 2016-04-10 LAB — MRSA CULTURE: CULTURE: NOT DETECTED

## 2016-04-10 LAB — BASIC METABOLIC PANEL
ANION GAP: 10 (ref 5–15)
BUN: 40 mg/dL — ABNORMAL HIGH (ref 6–20)
CO2: 27 mmol/L (ref 22–32)
Calcium: 8.5 mg/dL — ABNORMAL LOW (ref 8.9–10.3)
Chloride: 101 mmol/L (ref 101–111)
Creatinine, Ser: 5.14 mg/dL — ABNORMAL HIGH (ref 0.61–1.24)
GFR, EST AFRICAN AMERICAN: 12 mL/min — AB (ref >60)
GFR, EST NON AFRICAN AMERICAN: 11 mL/min — AB (ref >60)
Glucose, Bld: 97 mg/dL (ref 65–99)
POTASSIUM: 3.6 mmol/L (ref 3.5–5.1)
SODIUM: 138 mmol/L (ref 135–145)

## 2016-04-10 LAB — GLUCOSE, CAPILLARY
GLUCOSE-CAPILLARY: 125 mg/dL — AB (ref 65–99)
Glucose-Capillary: 124 mg/dL — ABNORMAL HIGH (ref 65–99)
Glucose-Capillary: 144 mg/dL — ABNORMAL HIGH (ref 65–99)
Glucose-Capillary: 85 mg/dL (ref 65–99)
Glucose-Capillary: 91 mg/dL (ref 65–99)
Glucose-Capillary: 97 mg/dL (ref 65–99)
Glucose-Capillary: 97 mg/dL (ref 65–99)

## 2016-04-10 LAB — PHOSPHORUS: Phosphorus: 4.4 mg/dL (ref 2.5–4.6)

## 2016-04-10 MED ORDER — ACETAMINOPHEN 325 MG PO TABS
650.0000 mg | ORAL_TABLET | Freq: Once | ORAL | Status: AC
  Administered 2016-04-10: 650 mg via ORAL

## 2016-04-10 MED ORDER — ACETAMINOPHEN 325 MG PO TABS
ORAL_TABLET | ORAL | Status: AC
  Administered 2016-04-10: 650 mg via ORAL
  Filled 2016-04-10: qty 2

## 2016-04-10 MED ORDER — ACETAMINOPHEN 325 MG PO TABS
650.0000 mg | ORAL_TABLET | Freq: Four times a day (QID) | ORAL | Status: DC | PRN
  Administered 2016-04-10: 650 mg via ORAL

## 2016-04-10 MED ORDER — FENTANYL CITRATE (PF) 100 MCG/2ML IJ SOLN
INTRAMUSCULAR | Status: AC
  Administered 2016-04-10: 100 ug via INTRAVENOUS
  Filled 2016-04-10: qty 2

## 2016-04-10 NOTE — Evaluation (Signed)
Occupational Therapy Evaluation Patient Details Name: [name] MRN: [id number] DOB: Aug 23, 1950 Today's Date: 04/10/2016    History of Present Illness 46M ESRD s/p resection of retroperitoneal mass and R hemicolectomy 04/07/16 for recurrent RCC. Complex PMH including bilateral blindness and partial amputation of L foot.   Clinical Impression   Pt admitted with the above diagnoses and presents with below problem list. Pt will benefit from continued acute OT to address the below listed deficits and maximize independence with basic ADLs prior to d/c to next venue. PTA pt was mod I with functional mobility and transfers, assist (mod?) for LB ADLs due to hip issues. Limited evaluation as pt with bed rest order. No transfers attempted this session. Pt sat EOB about 1 min. Recommending CIR and PT consult. Please update activity order once medically appropriate.     Follow Up Recommendations  CIR;Supervision/Assistance - 24 hour    Equipment Recommendations  Other (comment) (defer to next venue)    Recommendations for Other Services PT consult;Rehab consult     Precautions / Restrictions Precautions Precautions: Fall;Other (comment) (currently has bed rest order) Restrictions Weight Bearing Restrictions: No      Mobility Bed Mobility Overal bed mobility: Needs Assistance Bed Mobility: Supine to Sit;Sit to Supine     Supine to sit: Mod assist;HOB elevated Sit to supine: Mod assist   General bed mobility comments: +1 mod A supine<>EOB. +2 A to scoot up in bed.   Transfers                 General transfer comment: deferred at this time due to bed rest order    Balance Overall balance assessment: Needs assistance Sitting-balance support: Bilateral upper extremity supported;Feet supported Sitting balance-Leahy Scale: Fair Sitting balance - Comments: sat EOB about 1 minute with BUE support.                                    ADL Overall ADL's : Needs  assistance/impaired Eating/Feeding: NPO   Grooming: Set up;Minimal assistance;Sitting   Upper Body Bathing: Minimal assistance;Sitting   Lower Body Bathing: Maximal assistance;Sitting/lateral leans   Upper Body Dressing : Minimal assistance;Sitting   Lower Body Dressing: Maximal assistance;Sitting/lateral leans                 General ADL Comments: Pt completed bed mobility and sat EOB about 1 minute. Limited eval due to bed rest order.      Vision Additional Comments: "I don't have any eyes, they took them out."   Perception     Praxis      Pertinent Vitals/Pain Pain Assessment: Faces Faces Pain Scale: Hurts little more Pain Location: abdomen Pain Descriptors / Indicators: Grimacing Pain Intervention(s): Limited activity within patient's tolerance;Monitored during session;Repositioned     Hand Dominance Left   Extremity/Trunk Assessment Upper Extremity Assessment Upper Extremity Assessment: Generalized weakness (some decreased sensation B elbow level and distal)   Lower Extremity Assessment Lower Extremity Assessment: Defer to PT evaluation       Communication Communication Communication: No difficulties   Cognition Arousal/Alertness: Awake/alert Behavior During Therapy: WFL for tasks assessed/performed Overall Cognitive Status: Within Functional Limits for tasks assessed                     General Comments      Exercises       Shoulder Instructions      Home  Living Family/patient expects to be discharged to:: Private residence Living Arrangements: Spouse/significant other Available Help at Discharge: Family;Available 24 hours/day Type of Home: House Home Access: Ramped entrance     Home Layout: One level     Bathroom Shower/Tub: Tub/shower unit Shower/tub characteristics: Curtain Biochemist, clinical: Handicapped height     Home Equipment: Clinical cytogeneticist - 4 wheels;Bedside commode;Wheelchair - manual;Grab bars - toilet           Prior Functioning/Environment Level of Independence: Needs assistance  Gait / Transfers Assistance Needed: Rollator all the time ADL's / Homemaking Assistance Needed: Wife assists with setting water temperature, washing back, and LB dressing.    Comments: Pt reports doing isometric exercises.        OT Problem List: Decreased strength;Decreased activity tolerance;Impaired balance (sitting and/or standing);Impaired vision/perception;Decreased knowledge of use of DME or AE;Decreased knowledge of precautions;Impaired UE functional use;Pain   OT Treatment/Interventions: Self-care/ADL training;DME and/or AE instruction;Therapeutic activities;Patient/family education;Balance training;Therapeutic exercise;Energy conservation    OT Goals(Current goals can be found in the care plan section) Acute Rehab OT Goals Patient Stated Goal: not stated OT Goal Formulation: With patient/family Time For Goal Achievement: 04/24/16 Potential to Achieve Goals: Good ADL Goals Pt Will Perform Grooming: with modified independence;sitting Pt Will Perform Upper Body Bathing: sitting;with supervision Pt Will Perform Lower Body Bathing: sit to/from stand;with mod assist Pt Will Transfer to Toilet: with min guard assist;ambulating;bedside commode Pt Will Perform Toileting - Clothing Manipulation and hygiene: with min guard assist;sit to/from stand Pt Will Perform Tub/Shower Transfer: Tub transfer;with min assist;ambulating;shower seat;3 in 1;rolling walker Additional ADL Goal #1: Pt will complete bed mobility at supervision level to prepare for OOB ADLs.   OT Frequency: Min 3X/week   Barriers to D/C:            Co-evaluation              End of Session Nurse Communication: Other (comment) (Bed rest order, discussed sitting EOB)  Activity Tolerance: Patient limited by fatigue Patient left: in bed;with call bell/phone within reach;with family/visitor present;with SCD's reapplied   Time:  1001-1035 OT Time Calculation (min): 34 min Charges:  OT General Charges $OT Visit: 1 Procedure OT Evaluation $OT Eval Moderate Complexity: 1 Procedure G-Codes:    Hortencia Pilar 05-02-2016, 11:01 AM

## 2016-04-10 NOTE — Progress Notes (Signed)
Moved to [facility] overnight for HD Received HD overnight BP at baseline on midodine No n/v +BM x8 yesterday -> dx-ed w/ c.diff. On PO flagyl. Per patient 3 BMs overnight On sips and PO meds per surgery Minimal pain   Vitals:   04/10/16 0415 04/10/16 0445 04/10/16 0626 04/10/16 0930  BP: (!) 90/39 (!) 99/42 (!) 105/39 (!) 100/54  Pulse: 64 65 (!) 55 64  Resp: 20 16 17 18   Temp:  98 F (36.7 C) 97.4 F (36.3 C) 98.4 F (36.9 C)  TempSrc:  Oral Oral Oral  SpO2:   95% 97%  Weight:  196 lb 6.9 oz (89.1 kg)    Height:       I/O last 3 completed shifts: In: 1354.6 [P.O.:15; I.V.:1189.6; IV Piggyback:150] Out: 878 [Urine:18; Drains:160; Other:700] No intake/output data recorded.  NAD Soft approp T mild D Inc c/d/i JP SS No foley  CBC    Component Value Date/Time   WBC 3.6 (L) 04/10/2016 0733   RBC 2.33 (L) 04/10/2016 0733   HGB 8.1 (L) 04/10/2016 0733   HCT 23.6 (L) 04/10/2016 0733   HCT 25.7 (L) 11/03/2015 0553   PLT 73 (L) 04/10/2016 0733   MCV 101.3 (H) 04/10/2016 0733   MCH 34.8 (H) 04/10/2016 0733   MCHC 34.3 04/10/2016 0733   RDW 14.3 04/10/2016 0733   LYMPHSABS 0.4 (L) 04/10/2016 0733   MONOABS 0.3 04/10/2016 0733   EOSABS 0.1 04/10/2016 0733   BASOSABS 0.0 04/10/2016 0733   BMP Latest Ref Rng & Units 04/10/2016 04/09/2016 04/08/2016  Glucose 65 - 99 mg/dL 97 79 120(H)  BUN 6 - 20 mg/dL 40(H) 73(H) 65(H)  Creatinine 0.61 - 1.24 mg/dL 5.14(H) 7.25(H) 5.83(H)  Sodium 135 - 145 mmol/L 138 134(L) 134(L)  Potassium 3.5 - 5.1 mmol/L 3.6 4.5 4.4  Chloride 101 - 111 mmol/L 101 96(L) 98(L)  CO2 22 - 32 mmol/L 27 24 24   Calcium 8.9 - 10.3 mg/dL 8.5(L) 8.4(L) 8.2(L)  ;  POD #3 s/p exploratory laparotomy with resection of retroperitoneal mass (confirmed to be clear cell carcinoma by biopsy) requiring partial liver resection and right colectomy. -Hypotension - off neo. Continue midodrine.  -HD per nephrology -NPO with sips/meds per gen surg. Adv per gen surgery -C. Diff  -  continue flagyl for 10 days (today is abx day 2) -continue pain control -daily labs -appreciate OT eval. Will consult PT as well

## 2016-04-10 NOTE — Progress Notes (Signed)
3 Days Post-Op  Subjective: Alert.  Mental status normal. Fatigue and deconditioned.  Says he had dialysis at 2 AM. 1 stool recorded, surprising Denies nausea.  Would like something to drink Hemodynamically stable. Hemoglobin 8.1.  WBC 3600.  Potassium 3.6.  BUN 40.  Creatinine 5.14.  glc 97.  Objective: Vital signs in last 24 hours: Temp:  [97.2 F (36.2 C)-98.6 F (37 C)] 98.4 F (36.9 C) (01/07 0930) Pulse Rate:  [55-67] 64 (01/07 0930) Resp:  [10-22] 18 (01/07 0930) BP: (70-117)/(36-79) 100/54 (01/07 0930) SpO2:  [92 %-98 %] 97 % (01/07 0930) Weight:  [89.1 kg (196 lb 6.9 oz)-89.8 kg (197 lb 15.6 oz)] 89.1 kg (196 lb 6.9 oz) (01/07 0445) Last BM Date: 04/08/16  Intake/Output from previous day: 01/06 0701 - 01/07 0700 In: 507.3 [P.O.:15; I.V.:492.3] Out: 818 [Urine:18; Drains:100] Intake/Output this shift: No intake/output data recorded.  General appearance: Arousable and appropriate.  Well oriented.  Fatigued and deconditioned. Resp: clear to auscultation bilaterally GI: Abdomen surprisingly soft and scaphoid.  Wound clean.  JP drainage thin bloody fluid.  100 mL output per 24 hours.  No bilious content  Lab Results:  Results for orders placed or performed during the hospital encounter of 04/07/16 (from the past 24 hour(s))  Glucose, capillary     Status: None   Collection Time: 04/09/16 12:02 PM  Result Value Ref Range   Glucose-Capillary 69 65 - 99 mg/dL  Glucose, capillary     Status: Abnormal   Collection Time: 04/09/16 12:57 PM  Result Value Ref Range   Glucose-Capillary 100 (H) 65 - 99 mg/dL  Glucose, capillary     Status: None   Collection Time: 04/09/16  3:46 PM  Result Value Ref Range   Glucose-Capillary 65 65 - 99 mg/dL  Glucose, capillary     Status: None   Collection Time: 04/09/16  5:16 PM  Result Value Ref Range   Glucose-Capillary 93 65 - 99 mg/dL  Glucose, capillary     Status: None   Collection Time: 04/09/16  8:14 PM  Result Value Ref Range    Glucose-Capillary 90 65 - 99 mg/dL  Glucose, capillary     Status: None   Collection Time: 04/10/16 12:31 AM  Result Value Ref Range   Glucose-Capillary 85 65 - 99 mg/dL  Glucose, capillary     Status: None   Collection Time: 04/10/16  4:06 AM  Result Value Ref Range   Glucose-Capillary 91 65 - 99 mg/dL  CBC with Differential/Platelet     Status: Abnormal   Collection Time: 04/10/16  7:33 AM  Result Value Ref Range   WBC 3.6 (L) 4.0 - 10.5 K/uL   RBC 2.33 (L) 4.22 - 5.81 MIL/uL   Hemoglobin 8.1 (L) 13.0 - 17.0 g/dL   HCT 23.6 (L) 39.0 - 52.0 %   MCV 101.3 (H) 78.0 - 100.0 fL   MCH 34.8 (H) 26.0 - 34.0 pg   MCHC 34.3 30.0 - 36.0 g/dL   RDW 14.3 11.5 - 15.5 %   Platelets 73 (L) 150 - 400 K/uL   Neutrophils Relative % 81 %   Neutro Abs 2.9 1.7 - 7.7 K/uL   Lymphocytes Relative 11 %   Lymphs Abs 0.4 (L) 0.7 - 4.0 K/uL   Monocytes Relative 7 %   Monocytes Absolute 0.3 0.1 - 1.0 K/uL   Eosinophils Relative 2 %   Eosinophils Absolute 0.1 0.0 - 0.7 K/uL   Basophils Relative 0 %   Basophils Absolute  0.0 0.0 - 0.1 K/uL  Magnesium     Status: None   Collection Time: 04/10/16  7:33 AM  Result Value Ref Range   Magnesium 2.3 1.7 - 2.4 mg/dL  Phosphorus     Status: None   Collection Time: 04/10/16  7:33 AM  Result Value Ref Range   Phosphorus 4.4 2.5 - 4.6 mg/dL  Basic metabolic panel     Status: Abnormal   Collection Time: 04/10/16  7:33 AM  Result Value Ref Range   Sodium 138 135 - 145 mmol/L   Potassium 3.6 3.5 - 5.1 mmol/L   Chloride 101 101 - 111 mmol/L   CO2 27 22 - 32 mmol/L   Glucose, Bld 97 65 - 99 mg/dL   BUN 40 (H) 6 - 20 mg/dL   Creatinine, Ser 5.14 (H) 0.61 - 1.24 mg/dL   Calcium 8.5 (L) 8.9 - 10.3 mg/dL   GFR calc non Af Amer 11 (L) >60 mL/min   GFR calc Af Amer 12 (L) >60 mL/min   Anion gap 10 5 - 15  Glucose, capillary     Status: None   Collection Time: 04/10/16  7:47 AM  Result Value Ref Range   Glucose-Capillary 97 65 - 99 mg/dL      Studies/Results: No results found.  [name] atorvastatin  10 mg Oral Daily  . calcitRIOL  0.25 mcg Oral Once per day on Mon Wed Fri  . cefOXitin  1 g Intravenous Q24H  . cinacalcet  90 mg Oral Q breakfast  . heparin subcutaneous  5,000 Units Subcutaneous Q12H  . insulin aspart  0-15 Units Subcutaneous Q4H  . metroNIDAZOLE  500 mg Oral Q8H  . midodrine  10 mg Oral TID WC  . sevelamer carbonate  4,800 mg Oral TID WC     Assessment/Plan: s/p Procedure(s): EXPLORATORY LAPAROTOMY AND RESECTION OF RETROPERITONEAL MASS RIGHT COLECTOMY AND PARTIAL LIVER RESECTION  POD #3 -  1. Exploratory laparotomy, Resection of retroperitoneal mass, Right colectomy,  Liver resection 04/07/16, Dr. Raynelle Bring and Dr. Leighton Ruff.  Stable.  Mobilize out of bed.  Clear liquid diet. Kidney disease - HD overnight. Hx of CAD FEN:  IV fluids/NPO ID: Pre op + Cefoxitin day 2. For some reason had a C. Diff sent last night which was pos; "Positive for toxigenic C. difficile with little to no toxin production. Only treat if clinical presentation suggests symptomatic illness. "  Not sure why that was performed.  DVT:   SCD. OK to start chemical prophylaxis at discretion of primary team.   @PROBHOSP @  LOS: 3 days    Avonell Lenig M 04/10/2016  . .prob

## 2016-04-10 NOTE — Progress Notes (Signed)
Pt had a 2.07 second pause on telemetry. Pt is resting comfortably without complaints. Vss. HR in 60s.  I called the attending service urologist on call who asked me to contact medical. I notified Elink as well. Will continue to monitor.

## 2016-04-10 NOTE — Progress Notes (Signed)
Admit: 04/07/2016 LOS: 3  26M ESRD s/p resection of retroperitoneal mass and R hemicolectomy 04/07/16 for recurrent RCC  Subjective:  Started Flagyl for CDI HD overnight 0.7L UF Wife at bedside, updated   01/06 0701 - 01/07 0700 In: 507.3 [P.O.:15; I.V.:492.3] Out: 818 [Urine:18; Drains:100]  Filed Weights   04/07/16 1350 04/10/16 0145 04/10/16 0445  Weight: 89.5 kg (197 lb 5 oz) 89.8 kg (197 lb 15.6 oz) 89.1 kg (196 lb 6.9 oz)    Scheduled Meds: . atorvastatin  10 mg Oral Daily  . calcitRIOL  0.25 mcg Oral Once per day on Mon Wed Fri  . cefOXitin  1 g Intravenous Q24H  . cinacalcet  90 mg Oral Q breakfast  . heparin subcutaneous  5,000 Units Subcutaneous Q12H  . insulin aspart  0-15 Units Subcutaneous Q4H  . metroNIDAZOLE  500 mg Oral Q8H  . midodrine  10 mg Oral TID WC  . sevelamer carbonate  4,800 mg Oral TID WC   Continuous Infusions: . dextrose 1,000 mL (04/09/16 1609)   PRN Meds:.diphenhydrAMINE, fentaNYL (SUBLIMAZE) injection, ondansetron, sevelamer carbonate  Current Labs: reviewed    Physical Exam:  Blood pressure (!) 105/39, pulse (!) 55, temperature 97.4 F (36.3 C), temperature source Oral, resp. rate 17, height 6\' 4"  (1.93 m), weight 89.1 kg (196 lb 6.9 oz), SpO2 95 %. GEN: NAD ENT: NCAT,  in place EYES: Eyes closed, blind CV: RRR PULM: CTAB, ant auscultation ABD: RUQ incision dressed SKIN: no rashes/lesions; R IJ TDC C/D/I EXT:no edema LUE AVF +B/T, maturing nicely  Outpt HD Orders Unit: [facility] Days: MWF Time: 4.5h Dialyzer: F200 EDW: 90kg K/Ca: 2/2 Access: R IJ TDC, maturing LUE BC AVF placed 01/12/16 Needle Size: na BFR/DFR: 450/800 UF Proflie: none VDRA: calcitriol 1.4mcg qTx EPO: Mircera 159mcg q2wk -- last given 03/30/16 IV Fe: none Heparin: 8000 IVB qTx Most Recent Phos / PTH: 5.0 / 493 Most Recent TSAT / Ferritin: 35 / 1505 Most Recent eKT/V: 1.57 Treatment Adherence: excellent  A 1. ESRD 2. Recurrent RCC s/p resection, R  hemicolectomy 04/08/15 3. CDI on Flagyl 4. ?abd abscess discovered in surgery 5. CAD s/p CABG 6. LUE AVF 7. Anemia 8. 2HPTH  P 1. Reassess in AM for HD given diarrhea and poor PO  Pearson Grippe MD 04/10/2016, 8:55 AM   Recent Labs Lab 04/08/16 0409 04/08/16 1624 04/09/16 0321 04/09/16 0341 04/10/16 0733  NA 136 134*  --  134* 138  K 4.8 4.4  --  4.5 3.6  CL 96* 98*  --  96* 101  CO2 25 24  --  24 27  GLUCOSE 125* 120*  --  79 97  BUN 62* 65*  --  73* 40*  CREATININE 5.94* 5.83*  --  7.25* 5.14*  CALCIUM 9.0 8.2*  --  8.4* 8.5*  PHOS 6.8*  --  7.4*  --  4.4    Recent Labs Lab 04/08/16 0409 04/09/16 0321 04/10/16 0733  WBC 9.2 5.6 3.6*  NEUTROABS 8.2* 4.8 2.9  HGB 9.7* 8.6* 8.1*  HCT 27.6* 25.2* 23.6*  MCV 100.0 102.0* 101.3*  PLT 119* 89* 73*

## 2016-04-11 DIAGNOSIS — D539 Nutritional anemia, unspecified: Secondary | ICD-10-CM

## 2016-04-11 DIAGNOSIS — I9589 Other hypotension: Secondary | ICD-10-CM

## 2016-04-11 DIAGNOSIS — Z9889 Other specified postprocedural states: Secondary | ICD-10-CM

## 2016-04-11 DIAGNOSIS — R197 Diarrhea, unspecified: Secondary | ICD-10-CM

## 2016-04-11 LAB — CORTISOL-AM, BLOOD: Cortisol - AM: 12.9 ug/dL (ref 6.7–22.6)

## 2016-04-11 LAB — CBC WITH DIFFERENTIAL/PLATELET
BASOS ABS: 0 10*3/uL (ref 0.0–0.1)
Basophils Relative: 0 %
Eosinophils Absolute: 0.2 10*3/uL (ref 0.0–0.7)
Eosinophils Relative: 4 %
HCT: 24.2 % — ABNORMAL LOW (ref 39.0–52.0)
HEMOGLOBIN: 8.2 g/dL — AB (ref 13.0–17.0)
LYMPHS ABS: 0.6 10*3/uL — AB (ref 0.7–4.0)
LYMPHS PCT: 14 %
MCH: 34.3 pg — ABNORMAL HIGH (ref 26.0–34.0)
MCHC: 33.9 g/dL (ref 30.0–36.0)
MCV: 101.3 fL — AB (ref 78.0–100.0)
Monocytes Absolute: 0.3 10*3/uL (ref 0.1–1.0)
Monocytes Relative: 8 %
NEUTROS PCT: 73 %
Neutro Abs: 2.9 10*3/uL (ref 1.7–7.7)
PLATELETS: 91 10*3/uL — AB (ref 150–400)
RBC: 2.39 MIL/uL — AB (ref 4.22–5.81)
RDW: 14.1 % (ref 11.5–15.5)
WBC: 4 10*3/uL (ref 4.0–10.5)

## 2016-04-11 LAB — GLUCOSE, CAPILLARY
GLUCOSE-CAPILLARY: 97 mg/dL (ref 65–99)
GLUCOSE-CAPILLARY: 105 mg/dL — AB (ref 65–99)
Glucose-Capillary: 100 mg/dL — ABNORMAL HIGH (ref 65–99)
Glucose-Capillary: 127 mg/dL — ABNORMAL HIGH (ref 65–99)
Glucose-Capillary: 145 mg/dL — ABNORMAL HIGH (ref 65–99)
Glucose-Capillary: 146 mg/dL — ABNORMAL HIGH (ref 65–99)

## 2016-04-11 LAB — BASIC METABOLIC PANEL
ANION GAP: 12 (ref 5–15)
BUN: 49 mg/dL — ABNORMAL HIGH (ref 6–20)
CALCIUM: 8.6 mg/dL — AB (ref 8.9–10.3)
CHLORIDE: 98 mmol/L — AB (ref 101–111)
CO2: 26 mmol/L (ref 22–32)
Creatinine, Ser: 6.59 mg/dL — ABNORMAL HIGH (ref 0.61–1.24)
GFR calc non Af Amer: 8 mL/min — ABNORMAL LOW (ref >60)
GFR, EST AFRICAN AMERICAN: 9 mL/min — AB (ref >60)
Glucose, Bld: 114 mg/dL — ABNORMAL HIGH (ref 65–99)
Potassium: 3.6 mmol/L (ref 3.5–5.1)
SODIUM: 136 mmol/L (ref 135–145)

## 2016-04-11 LAB — MAGNESIUM: MAGNESIUM: 2.5 mg/dL — AB (ref 1.7–2.4)

## 2016-04-11 LAB — HEMOGLOBIN A1C
Hgb A1c MFr Bld: 4.8 % (ref 4.8–5.6)
MEAN PLASMA GLUCOSE: 91 mg/dL

## 2016-04-11 LAB — PHOSPHORUS: Phosphorus: 5.9 mg/dL — ABNORMAL HIGH (ref 2.5–4.6)

## 2016-04-11 LAB — HEPATITIS B SURFACE ANTIGEN: Hepatitis B Surface Ag: NEGATIVE

## 2016-04-11 MED ORDER — RISAQUAD PO CAPS
2.0000 | ORAL_CAPSULE | Freq: Every day | ORAL | Status: DC
  Administered 2016-04-11 – 2016-04-13 (×3): 2 via ORAL
  Filled 2016-04-11 (×3): qty 2

## 2016-04-11 MED ORDER — ASPIRIN EC 81 MG PO TBEC
81.0000 mg | DELAYED_RELEASE_TABLET | Freq: Every day | ORAL | Status: DC
Start: 2016-04-11 — End: 2016-04-13
  Administered 2016-04-11 – 2016-04-13 (×3): 81 mg via ORAL
  Filled 2016-04-11 (×3): qty 1

## 2016-04-11 MED ORDER — MIDODRINE HCL 5 MG PO TABS
ORAL_TABLET | ORAL | Status: AC
  Administered 2016-04-11: 10 mg
  Filled 2016-04-11: qty 2

## 2016-04-11 NOTE — Progress Notes (Signed)
  [facility] Progress Note   Subjective:   Vitals:   04/10/16 0930 04/10/16 1700 04/10/16 2040 04/11/16 0436  BP: (!) 100/54 (!) 105/47 114/62 (!) 111/55  Pulse: 64 68 60 60  Resp: 18 16 17 17   Temp: 98.4 F (36.9 C) 98.1 F (36.7 C) 99.6 F (37.6 C) 97.6 F (36.4 C)  TempSrc: Oral Oral Oral Oral  SpO2: 97% 96% 98% 98%  Weight:   90.2 kg (198 lb 13.7 oz)   Height:        Inpatient medications: . atorvastatin  10 mg Oral Daily  . calcitRIOL  0.25 mcg Oral Once per day on Mon Wed Fri  . cinacalcet  90 mg Oral Q breakfast  . heparin subcutaneous  5,000 Units Subcutaneous Q12H  . insulin aspart  0-15 Units Subcutaneous Q4H  . metroNIDAZOLE  500 mg Oral Q8H  . midodrine  10 mg Oral TID WC  . sevelamer carbonate  4,800 mg Oral TID WC   . dextrose 40 mL/hr at 04/10/16 1904   acetaminophen, diphenhydrAMINE, fentaNYL (SUBLIMAZE) injection, ondansetron, sevelamer carbonate  Exam: No distress, eyes closed (blind) NO jvd Chest clear bilat RRR no mrg Abd soft ntnd Ext no edema LUE AVF+ bruit NF, ox 3  Dialysis: MWF [facility] 4.5h   90kg 2/2 bath   R IJ tdc/ maturing LUA AVF (10/10)  Hep 8000  - Calc 1.75 ug , last pth 493, phos 5  - Mirc 100 last 12/27       Assessment: 1. ESRD 2. Recurrent RCC s/p resection, R hemicolectomy 04/08/15 3. CDI on Flagyl 4. Abd abscess discovered in surgery, drained intra-op 5. CAD s/p CABG 6. LUE AVF 7. Anemia 8. 2HPTH 9. Vol at dry wt  Plan - HD today   Kelly Splinter MD [facility] pager [phone]   04/11/2016, 9:49 AM    Recent Labs Lab 04/09/16 0321 04/09/16 0341 04/10/16 0733 04/11/16 0439  NA  --  134* 138 136  K  --  4.5 3.6 3.6  CL  --  96* 101 98*  CO2  --  24 27 26   GLUCOSE  --  79 97 114*  BUN  --  73* 40* 49*  CREATININE  --  7.25* 5.14* 6.59*  CALCIUM  --  8.4* 8.5* 8.6*  PHOS 7.4*  --  4.4 5.9*    Recent Labs Lab 04/08/16 0409  AST 73*  ALT 67*  ALKPHOS 103  BILITOT 0.4   PROT 5.4*  ALBUMIN 3.7    Recent Labs Lab 04/09/16 0321 04/10/16 0733 04/11/16 0439  WBC 5.6 3.6* 4.0  NEUTROABS 4.8 2.9 2.9  HGB 8.6* 8.1* 8.2*  HCT 25.2* 23.6* 24.2*  MCV 102.0* 101.3* 101.3*  PLT 89* 73* 91*   Iron/TIBC/Ferritin/ %Sat    Component Value Date/Time   IRON 37 (L) 09/29/2006 1131   TIBC 246 09/29/2006 1131   FERRITIN 429 (H) 09/29/2006 1131   IRONPCTSAT 15 (L) 09/29/2006 1131

## 2016-04-11 NOTE — Care Management Important Message (Signed)
Important Message  Patient Details  Name: [name] MRN: [id number] Date of Birth: [birth year]   Medicare Important Message Given:  Yes    Kamare Caspers, Rory Percy, RN 04/11/2016, 12:26 PM

## 2016-04-11 NOTE — Progress Notes (Signed)
Occupational Therapy Treatment Patient Details Name: [name] MRN: [id number] DOB: 11-06-1950 Today's Date: 04/11/2016    History of present illness 63M ESRD s/p resection of retroperitoneal mass and R hemicolectomy 04/07/16 for recurrent RCC. Complex PMH including bilateral blindness and partial amputation of L foot.   OT comments  Pt making progress with functional goals, OT will continue to follow acutely  Follow Up Recommendations  Home health OT;Supervision/Assistance - 24 hour    Equipment Recommendations  None recommended by OT    Recommendations for Other Services      Precautions / Restrictions Precautions Precautions: Fall Precaution Comments: blind Restrictions Weight Bearing Restrictions: No       Mobility Bed Mobility Overal bed mobility: Needs Assistance Bed Mobility: Supine to Sit     Supine to sit: HOB elevated;Max assist;Mod assist     General bed mobility comments: + 2 for trunk elevation  Transfers Overall transfer level: Needs assistance Equipment used: Rolling walker (2 wheeled) Transfers: Sit to/from Stand Sit to Stand: Min assist;+2 physical assistance;+2 safety/equipment         General transfer comment: pt required verbal and physical cues for directions due to blindness    Balance Overall balance assessment: Needs assistance Sitting-balance support: No upper extremity supported Sitting balance-Leahy Scale: Fair     Standing balance support: Bilateral upper extremity supported;During functional activity Standing balance-Leahy Scale: Poor Standing balance comment: using rw for support                   ADL Overall ADL's : Needs assistance/impaired     Grooming: Set up;Sitting;Wash/dry face;Wash/dry hands   Upper Body Bathing: Min guard;Minimal assistance (simulated)   Lower Body Bathing: Maximal assistance;Sitting/lateral leans;Moderate assistance Lower Body Bathing Details (indicate cue type and reason):  simulated Upper Body Dressing : Sitting;Supervision/safety;Set up Upper Body Dressing Details (indicate cue type and reason): cues for guiding to donn gown for back                 Functional mobility during ADLs: Rolling walker;Minimal assistance;Cueing for sequencing;+2 for physical assistance;+2 for safety/equipment General ADL Comments: pt required verbal and physical cues for directions due to blindness      Vision  pt blind                              Cognition   Behavior During Therapy: [facility] for tasks assessed/performed Overall Cognitive Status: Within Functional Limits for tasks assessed                       Extremity/Trunk Assessment  Upper Extremity Assessment Upper Extremity Assessment: Defer to OT evaluation   Lower Extremity Assessment Lower Extremity Assessment: Generalized weakness   Cervical / Trunk Assessment Cervical / Trunk Assessment: Kyphotic                General Comments  pt/family very pleasant and cooperative    Pertinent Vitals/ Pain       Pain Assessment: Faces Faces Pain Scale: Hurts little more Pain Location: JP drain incision site Pain Descriptors / Indicators: Grimacing;Guarding Pain Intervention(s): Limited activity within patient's tolerance;Monitored during session;Repositioned  Home Living Family/patient expects to be discharged to:: Private residence Living Arrangements: Spouse/significant other Available Help at Discharge: Family;Available 24 hours/day Type of Home: House Home Access: Ramped entrance     Home Layout: One level  Home Equipment: Clinical cytogeneticist - 4 wheels;Bedside commode;Wheelchair - manual;Grab bars - toilet          Prior Functioning/Environment Level of Independence: Needs assistance  Gait / Transfers Assistance Needed: rollator for ambulation ADL's / Homemaking Assistance Needed: spouse assists with bathing and dressing       Frequency  Min  2X/week        Progress Toward Goals  OT Goals(current goals can now be found in the care plan section)  Progress towards OT goals: Progressing toward goals  Acute Rehab OT Goals Patient Stated Goal: go home OT Goal Formulation: With patient/family  Plan Discharge plan remains appropriate    Co-evaluation    PT/OT/SLP Co-Evaluation/Treatment: Yes Reason for Co-Treatment: For patient/therapist safety PT goals addressed during session: Mobility/safety with mobility OT goals addressed during session: ADL's and self-care;Proper use of Adaptive equipment and DME      End of Session Equipment Utilized During Treatment: Gait belt;Rolling walker   Activity Tolerance Patient limited by fatigue   Patient Left with call bell/phone within reach;with family/visitor present;in chair   Nurse Communication          Time: 7106-2694 OT Time Calculation (min): 25 min  Charges: OT General Charges $OT Visit: 1 Procedure OT Treatments $Therapeutic Activity: 8-22 mins  Britt Bottom 04/11/2016, 12:35 PM

## 2016-04-11 NOTE — Progress Notes (Signed)
Patient ID: [name], male   DOB: 1951-02-25, 66 y.o.   MRN: [id number]  [facility] Surgery Progress Note  4 Days Post-Op  Subjective: Denies any current abdominal pain. Tolerating clear liquids. Had multiple loose BM's yesterday, and he is passing flatus. Patient had been on bed rest until yesterday, but has not been up yet today. Patient had 2.07 second pause on telemetry over night, but VSS and patient asymptomatic. HD today.  Objective: Vital signs in last 24 hours: Temp:  [97.6 F (36.4 C)-99.6 F (37.6 C)] 97.6 F (36.4 C) (01/08 0436) Pulse Rate:  [60-68] 60 (01/08 0436) Resp:  [16-17] 17 (01/08 0436) BP: (105-114)/(47-62) 111/55 (01/08 0436) SpO2:  [96 %-98 %] 98 % (01/08 0436) Weight:  [198 lb 13.7 oz (90.2 kg)] 198 lb 13.7 oz (90.2 kg) (01/07 2040) Last BM Date: 04/11/16  Intake/Output from previous day: 01/07 0701 - 01/08 0700 In: 1912 [P.O.:960; I.V.:902; IV Piggyback:50] Out: 40 [Drains:40] Intake/Output this shift: Total I/O In: 530 [P.O.:480; Other:50] Out: -   PE: Gen:  Alert, NAD, drowsy Pulm:  Effort normal Abd: Soft, NT/ND, incision C/D/I with staples intact and honeycomb dressing place, drain with bloody drainage in bulb  Lab Results:   Recent Labs  04/10/16 0733 04/11/16 0439  WBC 3.6* 4.0  HGB 8.1* 8.2*  HCT 23.6* 24.2*  PLT 73* 91*   BMET  Recent Labs  04/10/16 0733 04/11/16 0439  NA 138 136  K 3.6 3.6  CL 101 98*  CO2 27 26  GLUCOSE 97 114*  BUN 40* 49*  CREATININE 5.14* 6.59*  CALCIUM 8.5* 8.6*   PT/INR No results for input(s): LABPROT, INR in the last 72 hours. CMP     Component Value Date/Time   NA 136 04/11/2016 0439   K 3.6 04/11/2016 0439   CL 98 (L) 04/11/2016 0439   CO2 26 04/11/2016 0439   GLUCOSE 114 (H) 04/11/2016 0439   BUN 49 (H) 04/11/2016 0439   CREATININE 6.59 (H) 04/11/2016 0439   CREATININE 4.43 (H) 09/29/2014 1422   CALCIUM 8.6 (L) 04/11/2016 0439   PROT 5.4 (L) 04/08/2016 0409   ALBUMIN 3.7 04/08/2016 0409   AST 73 (H) 04/08/2016 0409   ALT 67 (H) 04/08/2016 0409   ALKPHOS 103 04/08/2016 0409   BILITOT 0.4 04/08/2016 0409   GFRNONAA 8 (L) 04/11/2016 0439   GFRNONAA 13 (L) 09/29/2014 1422   GFRAA 9 (L) 04/11/2016 0439   GFRAA 15 (L) 09/29/2014 1422   Lipase     Component Value Date/Time   LIPASE 28 11/01/2015 1812       Studies/Results: No results found.  Anti-infectives: Anti-infectives    Start     Dose/Rate Route Frequency Ordered Stop   04/09/16 1700  metroNIDAZOLE (FLAGYL) tablet 500 mg     500 mg Oral Every 8 hours 04/09/16 1601     04/07/16 2000  cefOXitin (MEFOXIN) 1 g in dextrose 5 % 50 mL IVPB  Status:  Discontinued     1 g 100 mL/hr over 30 Minutes Intravenous Every 24 hours 04/07/16 1411 04/11/16 0725   04/07/16 1330  cefOXitin (MEFOXIN) 1 g in dextrose 5 % 50 mL IVPB  Status:  Discontinued     1 g 100 mL/hr over 30 Minutes Intravenous Every 8 hours 04/07/16 1329 04/07/16 1412   04/07/16 0559  ceFAZolin (ANCEF) IVPB 2g/100 mL premix     2 g 200 mL/hr over 30 Minutes Intravenous 30 min pre-op 04/07/16 0559 04/07/16  0730       Assessment/Plan Renal Cell Carcinoma, retroperitoneal mass S/p Procedure(s): Exploratory laparotomy, Resection of retroperitoneal mass, Right colectomy, Liver resection 04/07/16 Dr. Alinda Money and Dr. Marcello Moores - POD 4 - surgical path pending - drain being removed per urology - multiple BM's yesterday and patient is passing flatus  ?C diff - (1/5) Positive for toxigenic C. difficile with little to no toxin production; patient has been on flagyl; had multiple loose BM's yesterday, none today thus far  ID - ancef periop, cefoxitin 1/4>>1/8, flagyl 1/6>> FEN - IVF, full liquids VTE - heparin, SCDs  Plan - Advance to full liquids. PT eval pending. Mobilize/out of bed. Plan to remove honeycomb dressing tomorrow.   LOS: 4 days    Jerrye Beavers , [facility] Surgery 04/11/2016, 9:58 AM Pager:  [phone] Consults: [phone] Mon-Fri 7:00 am-4:30 pm Sat-Sun 7:00 am-11:30 am

## 2016-04-11 NOTE — Consult Note (Addendum)
Medical Consultation   [name]  NOM:[id number]  DOB: 1951-03-13  DOA: 04/07/2016  PCP: [name], MD   Outpatient Specialists: Cardiology, urology, general surgery, nephrology, oncology   Requesting physician: Dr. Alinda Money - Urology  Reason for consultation: Diarrhea, Cdiff??, general medical management.    History of Present Illness: [name] is an 66 y.o. male patient presented to [facility] long hospital on 04/07/2016 for treatment of recurrent renal cell carcinoma. Patient is admitted by urology and underwent exploratory laparotomy by urology and general surgery. Patient's hospital course been complicated by hypotension and potential diagnoses of C. difficile. Patient transferred to [facility] for dialysis on 04/11/2016 and tried hospice consult for further assistance of this very complex patient. Of note patient was intermittently placed on phenylephrine by critical care medicine. At time of my evaluation patient states he feels at baseline of than mild incisional pain in his right upper quadrant. Denies any abdominal pain, nausea, fevers cough, dysuria, frequency, vomiting. States that his bowel habits are at baseline. He tolerating oral intake. At this time patient has inquiring about when his dialysis is when he'll be able to go home.    Review of Systems:  ROS As per HPI otherwise 10 point review of systems negative.    Past Medical History: Past Medical History:  Diagnosis Date  . Anemia   . Arthritis    "back, neck" (07/28/2014)  . Blind    both eyes removed   . Bruises easily   . CAD, NATIVE VESSEL 01/08/2008      . Cellulitis late 1980's   "hospitalized; wrapped both legs; several times; no OR for this"  . Colon polyps   . Diabetic neuropathy ([place])   . DM type 2 (diabetes mellitus, type 2) ([place])    no medications (07/28/2014) diet and excersie controlled  . ESRD (end stage renal disease) on dialysis [facility])    [name]; Mon,  Wed, Fri (07/28/2014)  . Family history of breast cancer    mother  . Heart murmur    hx of  . History of blood product transfusion   . HYPERLIPIDEMIA-MIXED 01/08/2008  . HYPERTENSION 01/27/2009   BP low , hx of HTN, Currently on meds to raise BP  . Hypotension   . Kidney stones   . Low blood pressure   . OVERWEIGHT/OBESITY 01/08/2008   Lost 205 lbs through diet and exercise.    . Peripheral vascular disease ([place])    vein stripping  . Pneumonia 2000;s X 1  . Prostate cancer ([place])   . Prosthetic eye globe    both eyes  . Renal cell carcinoma 2001 and 2003   "both kidneys"  . Renal insufficiency     Past Surgical History: Past Surgical History:  Procedure Laterality Date  . AV FISTULA PLACEMENT Left 02/2010   LFA  . AV FISTULA PLACEMENT Left 01/12/2016   Procedure: LEFT BRACHIOCEPHALIC ARTERIOVENOUS (AV) FISTULA CREATION;  Surgeon: Angelia Mould, MD;  Location: [place];  Service: Vascular;  Laterality: Left;  . CHOLECYSTECTOMY OPEN  1992  . COLONOSCOPY  06/14/2011   Procedure: COLONOSCOPY;  Surgeon: [name], MD;  Location: [facility];  Service: Endoscopy;  Laterality: N/A;  . COLONOSCOPY N/A 07/24/2012   Procedure: COLONOSCOPY;  Surgeon: [name], MD;  [facility];  Service: Endoscopy;  Laterality: N/A;  . COLONOSCOPY    . COLONOSCOPY N/A 11/04/2015  Procedure: COLONOSCOPY;  Surgeon: Manus Gunning, MD;  Location: [facility] ENDOSCOPY;  Service: Gastroenterology;  Laterality: N/A;  . COLONOSCOPY WITH PROPOFOL N/A 05/13/2014   Procedure: COLONOSCOPY WITH PROPOFOL;  Surgeon: [name], MD;  [facility];  Service: Endoscopy;  Laterality: N/A;  . CORONARY ARTERY BYPASS GRAFT  09/29/2011   Procedure: CORONARY ARTERY BYPASS GRAFTING (CABG);  Surgeon: [name], MD;  Location: [place];  Service: Open Heart Surgery;  Laterality: N/A;  Coronary Artery Bypass Graft times two utilizing the left internal mammary artery and the right greater  saphenous vein harvested endoscopically.  . CYSTOSCOPY WITH RETROGRADE PYELOGRAM, URETEROSCOPY AND STENT PLACEMENT Left 07/28/2014   Procedure: CYSTOSCOPY WITH RETROGRADE PYELOGRAM, URETERAL BALLOON DILITATION, URETEROSCOPY AND LEFT STENT PLACEMENT;  Surgeon: [name], MD;  [facility];  Service: Urology;  Laterality: Left;  . ENUCLEATION  2003; 2006   bilateral; "diabetes; pain"  . FOOT AMPUTATION Right ~ 2002   right;  partial; "infection"  . FRACTURE SURGERY     hip fx  . HIP PINNING,CANNULATED Left 12/31/2013   Procedure: CANNULATED HIP PINNING;  Surgeon: Renette Butters, MD;  Location: [place];  Service: Orthopedics;  Laterality: Left;  Carm, FX Table, [employer]  . HOT HEMOSTASIS  06/14/2011   Procedure: HOT HEMOSTASIS (ARGON PLASMA COAGULATION/BICAP);  Surgeon: [name], MD;  Location[facility];  Service: Endoscopy;  Laterality: N/A;  . INSERTION OF DIALYSIS CATHETER Right 01/12/2016   Procedure: INSERTION OF DIALYSIS CATHETER;  Surgeon: Angelia Mould, MD;  Location: [place];  Service: Vascular;  Laterality: Right;  . INSERTION PROSTATE RADIATION SEED  03/2012  . IR GENERIC HISTORICAL  01/13/2016   IR RADIOLOGIST EVAL & MGMT 01/13/2016 Aletta Edouard, MD [facility] INTERV RAD  . LAPAROTOMY N/A 04/07/2016   Procedure: EXPLORATORY LAPAROTOMY AND RESECTION OF RETROPERITONEAL MASS;  Surgeon: Raynelle Bring, MD;  [facility];  Service: Urology;  Laterality: N/A;  . LEFT HEART CATHETERIZATION WITH CORONARY ANGIOGRAM N/A 09/27/2011   Procedure: LEFT HEART CATHETERIZATION WITH CORONARY ANGIOGRAM;  Surgeon: [name], MD;  Location: [facility] CATH LAB;  Service: Cardiovascular;  Laterality: N/A;  . LIGATION OF ARTERIOVENOUS  FISTULA Left 01/12/2016   Procedure: LIGATION OF LEFT RADIOCEPHALIC ARTERIOVENOUS  FISTULA;  Surgeon: Angelia Mould, MD;  Location: [place];  Service: Vascular;  Laterality: Left;  . LIGATION OF COMPETING BRANCHES OF ARTERIOVENOUS FISTULA Left 07/29/2014     Procedure: LIGATION OF COMPETING BRANCHES OF LEFT ARM ARTERIOVENOUS FISTULA;  Surgeon: [name], MD;  Location: [place];  Service: Vascular;  Laterality: Left;  . NEPHRECTOMY Right 01/30/2015   done at [place]  . PARTIAL COLECTOMY Right 04/07/2016   Procedure: RIGHT COLECTOMY AND PARTIAL LIVER RESECTION;  Surgeon: Raynelle Bring, MD;  Location[facility];  Service: Urology;  Laterality: Right;  . RADIOFREQUENCY ABLATION KIDNEY  2010-2012   "twice; one on each side; for cancer"  . REVISON OF ARTERIOVENOUS FISTULA Left 09/01/2015   Procedure: EXPLORATION LEFT LOWER ARM  RADIOCEPHALIC ARTERIOVENOUS FISTULA;  Surgeon: Conrad La Grande, MD;  Location: [place];  Service: Vascular;  Laterality: Left;  [name] VARICOSE VEIN SURGERY  mid 1980's    BLE; "knees down; both legs; 2 separate times"     Allergies:   Allergies  Allergen Reactions  . Codeine Other (See Comments)     [place]  . Tape Other (See Comments)    Plastic tape tears skin off, please use paper tape instead.  Social History:  reports that he has never smoked. He has never used smokeless tobacco. He reports that he does not drink alcohol or use drugs.   Family History: Family History  Problem Relation Age of Onset  . Cancer Mother 45    breast, spine and ovarian  . Heart disease Mother   . Hyperlipidemia Mother   . Hypertension Mother   . Varicose Veins Mother   . Emphysema Father   . Cancer Father 73    kidney, prostate  . Heart disease Father   . Hyperlipidemia Father   . Hypertension Father   . Cancer Sister     ovarian  . Cancer Brother     lung  . Heart disease Brother   . Hyperlipidemia Brother   . Hypertension Brother   . Heart attack Brother   . Peripheral vascular disease Brother   . Cancer    . Coronary artery disease    . Kidney disease    . Breast cancer    . Ovarian cancer    . Lung cancer    . Diabetes    . Malignant hyperthermia Neg Hx      Physical Exam: Vitals:    04/10/16 0930 04/10/16 1700 04/10/16 2040 04/11/16 0436  BP: (!) 100/54 (!) 105/47 114/62 (!) 111/55  Pulse: 64 68 60 60  Resp: 18 16 17 17   Temp: 98.4 F (36.9 C) 98.1 F (36.7 C) 99.6 F (37.6 C) 97.6 F (36.4 C)  TempSrc: Oral Oral Oral Oral  SpO2: 97% 96% 98% 98%  Weight:   90.2 kg (198 lb 13.7 oz)   Height:        General:  Appears calm and comfortable Eyes: Blind, EOMI, normal lids, iris ENT:  grossly normal hearing, lips & tongue, mmm Neck:  no LAD, masses or thyromegaly Cardiovascular: II/VI systolic murmur, RRR, No LE edema.  Respiratory:  CTA bilaterally, no w/r/r. Normal respiratory effort. Abdomen: Soft, nontender, nondistended, surgical dressing in right upper quadrant clean dry and intact Skin:  no rash or induration seen on limited exam Musculoskeletal:  grossly normal tone BUE/BLE, good ROM, no bony abnormality Psychiatric:  grossly normal mood and affect, speech fluent and appropriate, AOx3 Neurologic:  CN 2-12 grossly intact, moves all extremities in coordinated fashion, sensation intact  Data reviewed:  I have personally reviewed following labs and imaging studies Labs:  CBC:  Recent Labs Lab 04/07/16 2002 04/08/16 0409 04/09/16 0321 04/10/16 0733 04/11/16 0439  WBC 10.3 9.2 5.6 3.6* 4.0  NEUTROABS 9.4* 8.2* 4.8 2.9 2.9  HGB 10.4* 9.7* 8.6* 8.1* 8.2*  HCT 30.2* 27.6* 25.2* 23.6* 24.2*  MCV 101.7* 100.0 102.0* 101.3* 101.3*  PLT 116* 119* 89* 73* 91*    Basic Metabolic Panel:  Recent Labs Lab 04/08/16 0409 04/08/16 1624 04/09/16 0321 04/09/16 0341 04/10/16 0733 04/11/16 0439  NA 136 134*  --  134* 138 136  K 4.8 4.4  --  4.5 3.6 3.6  CL 96* 98*  --  96* 101 98*  CO2 25 24  --  24 27 26   GLUCOSE 125* 120*  --  79 97 114*  BUN 62* 65*  --  73* 40* 49*  CREATININE 5.94* 5.83*  --  7.25* 5.14* 6.59*  CALCIUM 9.0 8.2*  --  8.4* 8.5* 8.6*  MG 2.8*  --  2.8*  --  2.3 2.5*  PHOS 6.8*  --  7.4*  --  4.4 5.9*   GFR Estimated Creatinine  Clearance: 13.5  mL/min (by C-G formula based on SCr of 6.59 mg/dL (H)). Liver Function Tests:  Recent Labs Lab 04/08/16 0409  AST 73*  ALT 67*  ALKPHOS 103  BILITOT 0.4  PROT 5.4*  ALBUMIN 3.7   No results for input(s): LIPASE, AMYLASE in the last 168 hours. No results for input(s): AMMONIA in the last 168 hours. Coagulation profile  Recent Labs Lab 04/08/16 0409  INR 1.04    Cardiac Enzymes:  Recent Labs Lab 04/07/16 2002 04/08/16 0409  TROPONINI <0.03 <0.03   BNP: Invalid input(s): POCBNP CBG:  Recent Labs Lab 04/10/16 1711 04/10/16 2037 04/11/16 0000 04/11/16 0422 04/11/16 0758  GLUCAP 144* 124* 125* 97 105*   D-Dimer No results for input(s): DDIMER in the last 72 hours. Hgb A1c  Recent Labs  04/09/16 2352  HGBA1C 4.8   Lipid Profile No results for input(s): CHOL, HDL, LDLCALC, TRIG, CHOLHDL, LDLDIRECT in the last 72 hours. Thyroid function studies No results for input(s): TSH, T4TOTAL, T3FREE, THYROIDAB in the last 72 hours.  Invalid input(s): FREET3 Anemia work up No results for input(s): VITAMINB12, FOLATE, FERRITIN, TIBC, IRON, RETICCTPCT in the last 72 hours. Urinalysis    Component Value Date/Time   COLORURINE YELLOW 11/01/2015 1917   APPEARANCEUR CLOUDY (A) 11/01/2015 1917   LABSPEC 1.012 11/01/2015 1917   PHURINE 7.0 11/01/2015 1917   GLUCOSEU NEGATIVE 11/01/2015 1917   HGBUR SMALL (A) 11/01/2015 1917   BILIRUBINUR NEGATIVE 11/01/2015 1917   KETONESUR NEGATIVE 11/01/2015 1917   PROTEINUR 100 (A) 11/01/2015 1917   UROBILINOGEN 0.2 07/28/2014 1007   NITRITE NEGATIVE 11/01/2015 1917   LEUKOCYTESUR LARGE (A) 11/01/2015 1917     Microbiology Recent Results (from the past 240 hour(s))  Aerobic/Anaerobic Culture (surgical/deep wound)     Status: None (Preliminary result)   Collection Time: 04/07/16 10:25 AM  Result Value Ref Range Status   Specimen Description WOUND INTRA ABDOMINAL  Final   Special Requests NONE  Final   Gram  Stain   Final    RARE WBC PRESENT, PREDOMINANTLY MONONUCLEAR NO ORGANISMS SEEN    Culture   Final    NO GROWTH 2 DAYS NO ANAEROBES ISOLATED; CULTURE IN PROGRESS FOR 5 DAYS Performed at [facility]    Report Status PENDING  Incomplete  MRSA PCR Screening     Status: Abnormal   Collection Time: 04/08/16 12:04 AM  Result Value Ref Range Status   MRSA by PCR INVALID RESULTS, SPECIMEN SENT FOR CULTURE (A) NEGATIVE Final    Comment:        The GeneXpert MRSA Assay (FDA approved for NASAL specimens only), is one component of a comprehensive MRSA colonization surveillance program. It is not intended to diagnose MRSA infection nor to guide or monitor treatment for MRSA infections. RESULT CALLED TO, READ BACK BY AND VERIFIED WITH: JOHNSON RN AT 3016 ON 01.05.18 BY SHUEA   MRSA culture     Status: None   Collection Time: 04/08/16 12:04 AM  Result Value Ref Range Status   Specimen Description NOSE  Final   Special Requests NONE  Final   Culture   Final    NO MRSA DETECTED Performed at [facility]    Report Status 04/10/2016 FINAL  Final  C difficile quick scan w PCR reflex     Status: Abnormal   Collection Time: 04/08/16  6:30 PM  Result Value Ref Range Status   C Diff antigen POSITIVE (A) NEGATIVE Final   C Diff toxin NEGATIVE NEGATIVE Final  C Diff interpretation Results are indeterminate. See PCR results.  Final  Clostridium Difficile by PCR     Status: Abnormal   Collection Time: 04/08/16  6:30 PM  Result Value Ref Range Status   Toxigenic C Difficile by pcr POSITIVE (A) NEGATIVE Final    Comment: Positive for toxigenic C. difficile with little to no toxin production. Only treat if clinical presentation suggests symptomatic illness. Performed at [facility]        Inpatient Medications:   Scheduled Meds: . acidophilus  2 capsule Oral Daily  . aspirin EC  81 mg Oral Daily  . atorvastatin  10 mg Oral Daily  . calcitRIOL  0.25 mcg Oral Once per  day on Mon Wed Fri  . cinacalcet  90 mg Oral Q breakfast  . heparin subcutaneous  5,000 Units Subcutaneous Q12H  . insulin aspart  0-15 Units Subcutaneous Q4H  . midodrine  10 mg Oral TID WC  . sevelamer carbonate  4,800 mg Oral TID WC   Continuous Infusions: . dextrose 40 mL/hr at 04/10/16 1904     Radiological Exams on Admission: No results found.  Impression/Recommendations Active Problems:   ESRD (end stage renal disease) on dialysis (HCC)   Hypotension   Renal cell carcinoma (HCC)   Hx of exploratory laparotomy   Shock circulatory (HCC)   Macrocytic anemia   Diarrhea  Renal cell Carcinoma w/ recurrence: Ongoing treatment at [facility] and [facility]. [name] POD #4. S/p resection of retroperitoneal mass w/ R colectomy and liver resection. Performed by Urology, Dr. Alinda Money and General surgery, Dr. Marcello Moores.  - Diet, mobilization, pain mgt, wound care per surgery/urology.  Diarrhea: chronic per pt. No abd pain other than minimal incisional pain. At baseline. Labs sent on 04/08/16 for dairrhea showing Cdiff Ag + and Tox + ("Little to no toxin production. Only treat if clinical presentation suggests symptomatic illness).  - OK to stop Flagyl and precautions.  - monitor bowel habits closely - begin probiotic - lactoferrin  ESRD: Dialysis MWF per pt in outpt setting. Chemistries appear stable based on todays labs. - dialysis per Dr. Jonnie Finner  Anemia: Hgb 8.2. Baseline 10-11. Macrocytic. Likely from progressive renal disease and malnutrition - Prealbumin - anemia panel - consider Epo if persists w/ goal Hgb >10.   Dispo: Pt requesting to go home. States his wife is able to care for him at home.  - PT/OT/CSW for placement vs home health - CIR??  DM: h/o poorly controlled DM. Currently diet controlled w/ last A1c nml at 4.8 - Daily chemistry - SSI  Hypotension: chronic. Stopped phenylephrine drip 04/09/16 - Contineu midodrine - AM cortisol  CAD: primary cardiologist Dr. Percival Spanish. CABG 2013.  Myoview at [facility] 05/2015 showed low risk. Bradycardia overnight w/o symptoms.  - continue statin - resume ASA 81mg . - Contionue tele   Thank you for this consultation.  Our [facility] hospitalist team will follow the patient with you.   Enna Warwick J M.D. [facility] 04/11/2016, 10:54 AM

## 2016-04-11 NOTE — Evaluation (Signed)
Physical Therapy Evaluation Patient Details Name: [name] MRN: [id number] DOB: 1951/03/01 Today's Date: 04/11/2016   History of Present Illness  66M ESRD s/p resection of retroperitoneal mass and R hemicolectomy 04/07/16 for recurrent RCC. Complex PMH including bilateral blindness and partial amputation of L foot.  Clinical Impression  Pt admitted with above diagnosis. Pt currently with functional limitations due to the deficits listed below (see PT Problem List). Pt able to ambulate 12 with rw in room, assist needed with maneuvering in an unfamiliar environment. Both pt and spouse report feeling confident about going home from the hospital at D/C. Recommending HHPT services to follow. Pt will benefit from skilled PT to increase their independence and safety with mobility to allow discharge to the venue listed below.       Follow Up Recommendations Home health PT;Supervision for mobility/OOB    Equipment Recommendations  None recommended by PT    Recommendations for Other Services       Precautions / Restrictions Precautions Precautions: Fall Precaution Comments: blind Restrictions Weight Bearing Restrictions: No      Mobility  Bed Mobility Overal bed mobility: Needs Assistance Bed Mobility: Supine to Sit     Supine to sit: HOB elevated;Max assist;Mod assist     General bed mobility comments: + 2 for trunk elevation  Transfers Overall transfer level: Needs assistance Equipment used: Rolling walker (2 wheeled) Transfers: Sit to/from Stand Sit to Stand: Min assist;+2 physical assistance;+2 safety/equipment         General transfer comment: pt required verbal and physical cues for directions due to blindness  Ambulation/Gait Ambulation/Gait assistance: Min guard Ambulation Distance (Feet): 12 Feet Assistive device: Rolling walker (2 wheeled) Gait Pattern/deviations: Step-to pattern;Decreased step length - right;Decreased step length - left;Trunk flexed Gait  velocity: dereased   General Gait Details: assist needed for guidance in unfamiliar environment.   Stairs            Wheelchair Mobility    Modified Rankin (Stroke Patients Only)       Balance Overall balance assessment: Needs assistance Sitting-balance support: No upper extremity supported Sitting balance-Leahy Scale: Fair     Standing balance support: Bilateral upper extremity supported;During functional activity Standing balance-Leahy Scale: Poor Standing balance comment: using rw for support                             Pertinent Vitals/Pain Pain Assessment: Faces Faces Pain Scale: Hurts little more Pain Location: JP drain incision site Pain Descriptors / Indicators: Grimacing;Guarding Pain Intervention(s): Limited activity within patient's tolerance;Monitored during session;Repositioned    Home Living Family/patient expects to be discharged to:: Private residence Living Arrangements: Spouse/significant other Available Help at Discharge: Family;Available 24 hours/day Type of Home: House Home Access: Ramped entrance     Home Layout: One level Home Equipment: Clinical cytogeneticist - 4 wheels;Bedside commode;Wheelchair - manual;Grab bars - toilet      Prior Function Level of Independence: Needs assistance   Gait / Transfers Assistance Needed: rollator for ambulation  ADL's / Homemaking Assistance Needed: spouse assists with bathing and dressing        Hand Dominance        Extremity/Trunk Assessment   Upper Extremity Assessment Upper Extremity Assessment: Defer to OT evaluation    Lower Extremity Assessment Lower Extremity Assessment: Generalized weakness    Cervical / Trunk Assessment Cervical / Trunk Assessment: Kyphotic  Communication   Communication: No difficulties  Cognition Arousal/Alertness: Awake/alert Behavior During  Therapy: WFL for tasks assessed/performed Overall Cognitive Status: Within Functional Limits for tasks  assessed                      General Comments      Exercises     Assessment/Plan    PT Assessment Patient needs continued PT services  PT Problem List Decreased strength;Decreased range of motion;Decreased activity tolerance;Decreased balance;Decreased mobility;Decreased safety awareness          PT Treatment Interventions DME instruction;Gait training;Functional mobility training;Therapeutic activities;Therapeutic exercise;Balance training    PT Goals (Current goals can be found in the Care Plan section)  Acute Rehab PT Goals Patient Stated Goal: go home PT Goal Formulation: With patient/family Time For Goal Achievement: 04/25/16 Potential to Achieve Goals: Good    Frequency Min 3X/week   Barriers to discharge        Co-evaluation PT/OT/SLP Co-Evaluation/Treatment: Yes Reason for Co-Treatment: For patient/therapist safety PT goals addressed during session: Mobility/safety with mobility         End of Session Equipment Utilized During Treatment: Gait belt Activity Tolerance: Patient tolerated treatment well Patient left: in chair;with call bell/phone within reach;with family/visitor present Nurse Communication: Mobility status         Time: 0601-5615 PT Time Calculation (min) (ACUTE ONLY): 25 min   Charges:   PT Evaluation $PT Eval Moderate Complexity: 1 Procedure     PT G Codes:        Cassell Clement, PT, CSCS Pager [phone] Office [phone]  04/11/2016, 12:31 PM

## 2016-04-11 NOTE — Progress Notes (Signed)
Spoke with Margeraet from Infection control. Pt is positive for Cdiff and is to remian on Enteric precautions. Order for enteric placed again as Dr discontinued it.

## 2016-04-11 NOTE — Progress Notes (Signed)
Called to take out JP drain.  JP drain removed and site cover with 2x2 dressing, no bleeding from site noted.  patietn tolerated procedure well.  RN to call if assistance needed

## 2016-04-11 NOTE — Progress Notes (Signed)
Patient ID: [name], male   DOB: 03/14/1951, 66 y.o.   MRN: [id number]   4 Days Post-Op Subjective: Events reviewed from over the weekend.  Patient was able to be titrated off IV phenylephrine by Saturday morning and was transferred to [facility] for continued care and hemodialysis.  He underwent dialysis Saturday night.  He also had a positive C. diff PCR but with little to no toxin production.  It is unclear who ordered this but he was started on po metronidazole on Saturday for this reason and he has continued to have frequent foul smelling bowel movements.  He has not yet been started on a diet per General Surgery.  He denies nausea or vomiting.  Pain has been well controlled with minimal need for narcotic pain medication. Pt last night with 2 sec pause on telemetry.  Pt noted to be completely hemodynamically stable and asymptomatic.  Upper extremity numbness noted last week is now almost resolved per patient.  Objective: Vital signs in last 24 hours: Temp:  [97.6 F (36.4 C)-99.6 F (37.6 C)] 97.6 F (36.4 C) (01/08 0436) Pulse Rate:  [60-68] 60 (01/08 0436) Resp:  [16-18] 17 (01/08 0436) BP: (100-114)/(47-62) 111/55 (01/08 0436) SpO2:  [96 %-98 %] 98 % (01/08 0436) Weight:  [90.2 kg (198 lb 13.7 oz)] 90.2 kg (198 lb 13.7 oz) (01/07 2040)  Intake/Output from previous day: 01/07 0701 - 01/08 0700 In: 1912 [P.O.:960; I.V.:902; IV Piggyback:50] Out: 40 [Drains:40] Intake/Output this shift: No intake/output data recorded.  Physical Exam:  General: Alert and oriented CV: RRR Lungs: Normal respiratory effort. Abdomen: Soft, ND, NT Incisions: Dressing intact, wound clean without erythema or drainage, drain with minimal amount of serosanguineous fluid Ext: NT, No erythema  Lab Results:  Recent Labs  04/09/16 0321 04/10/16 0733 04/11/16 0439  HGB 8.6* 8.1* 8.2*  HCT 25.2* 23.6* 24.2*   CBC Latest Ref Rng & Units 04/11/2016 04/10/2016 04/09/2016  WBC 4.0 - 10.5 K/uL 4.0 3.6(L)  5.6  Hemoglobin 13.0 - 17.0 g/dL 8.2(L) 8.1(L) 8.6(L)  Hematocrit 39.0 - 52.0 % 24.2(L) 23.6(L) 25.2(L)  Platelets 150 - 400 K/uL 91(L) 73(L) 89(L)     BMET  Recent Labs  04/10/16 0733 04/11/16 0439  NA 138 136  K 3.6 3.6  CL 101 98*  CO2 27 26  GLUCOSE 97 114*  BUN 40* 49*  CREATININE 5.14* 6.59*  CALCIUM 8.5* 8.6*    Assessment/Plan: POD #4 s/p exploratory laparotomy with resection of retroperitoneal mass (confirmed to be clear cell carcinoma by biopsy) requiring partial liver resection and right colectomy.  1) CV: Continue midodrine per usual dose. HD stable.  He has been asymptomatic despite one pause. Continue telemetry.  Electrolytes stable. 2) Renal: Per nephrology.  Likely to have hemodialysis today. 3) FEN: Pt still NPO per General Surgery.  He likely can have his diet advanced although will defer to General Surgery per discussion with Dr. Marcello Moores last week.  Continue to monitor BMP.  Will SL IVF once diet is advanced. 4) ID: Will remove drain and stop cefoxitin as patient has remained without clinical symptoms of infection and culture from surgery suggested sterile abscess.  Pt with positive C. diff PCR but with no toxin production.  Continue metronidazole day #3. 5) GI: Pt still with frequent foul smelling loose stools despite treatment for C. Diff. Will ask advice from General Surgery and Medicine if additional evaluation or change in treatment should be considered.  6) Post-op care: Continue Farmers heparin for  DVT prophylaxis.  PT/OT consults pending. 7) Oncology: Pathology pending. 8) Disp: Pending advancement of diet per General Surgery and PT/OT evaluation.  Would hope for discharge within 2-3 days in this very medically complex patient.  With CCM having signed off, patient now at [facility] where it is more difficult for me to closely monitor/provide care for the patient, and considering multiple medically complex issues, I will ask [facility] to consult to  assist with medical management.   LOS: 4 days   Steaven Wholey,LES 04/11/2016, 7:07 AM

## 2016-04-12 LAB — CBC
HCT: 24 % — ABNORMAL LOW (ref 39.0–52.0)
HEMOGLOBIN: 8 g/dL — AB (ref 13.0–17.0)
MCH: 34.2 pg — AB (ref 26.0–34.0)
MCHC: 33.3 g/dL (ref 30.0–36.0)
MCV: 102.6 fL — AB (ref 78.0–100.0)
Platelets: 91 10*3/uL — ABNORMAL LOW (ref 150–400)
RBC: 2.34 MIL/uL — AB (ref 4.22–5.81)
RDW: 14 % (ref 11.5–15.5)
WBC: 3.5 10*3/uL — ABNORMAL LOW (ref 4.0–10.5)

## 2016-04-12 LAB — IRON AND TIBC
Iron: 26 ug/dL — ABNORMAL LOW (ref 45–182)
Saturation Ratios: 13 % — ABNORMAL LOW (ref 17.9–39.5)
TIBC: 200 ug/dL — ABNORMAL LOW (ref 250–450)
UIBC: 174 ug/dL

## 2016-04-12 LAB — VITAMIN B12: VITAMIN B 12: 1851 pg/mL — AB (ref 180–914)

## 2016-04-12 LAB — FERRITIN: Ferritin: 1350 ng/mL — ABNORMAL HIGH (ref 24–336)

## 2016-04-12 LAB — BASIC METABOLIC PANEL
ANION GAP: 8 (ref 5–15)
BUN: 23 mg/dL — ABNORMAL HIGH (ref 6–20)
CALCIUM: 8.5 mg/dL — AB (ref 8.9–10.3)
CHLORIDE: 100 mmol/L — AB (ref 101–111)
CO2: 28 mmol/L (ref 22–32)
Creatinine, Ser: 4.56 mg/dL — ABNORMAL HIGH (ref 0.61–1.24)
GFR calc non Af Amer: 12 mL/min — ABNORMAL LOW (ref >60)
GFR, EST AFRICAN AMERICAN: 14 mL/min — AB (ref >60)
Glucose, Bld: 89 mg/dL (ref 65–99)
Potassium: 4.4 mmol/L (ref 3.5–5.1)
Sodium: 136 mmol/L (ref 135–145)

## 2016-04-12 LAB — AEROBIC/ANAEROBIC CULTURE (SURGICAL/DEEP WOUND)

## 2016-04-12 LAB — AEROBIC/ANAEROBIC CULTURE W GRAM STAIN (SURGICAL/DEEP WOUND): Culture: NO GROWTH

## 2016-04-12 LAB — GLUCOSE, CAPILLARY
GLUCOSE-CAPILLARY: 129 mg/dL — AB (ref 65–99)
Glucose-Capillary: 101 mg/dL — ABNORMAL HIGH (ref 65–99)
Glucose-Capillary: 123 mg/dL — ABNORMAL HIGH (ref 65–99)
Glucose-Capillary: 157 mg/dL — ABNORMAL HIGH (ref 65–99)
Glucose-Capillary: 83 mg/dL (ref 65–99)
Glucose-Capillary: 91 mg/dL (ref 65–99)

## 2016-04-12 LAB — HEPATITIS B SURFACE ANTIBODY,QUALITATIVE: Hep B S Ab: NONREACTIVE

## 2016-04-12 LAB — RETICULOCYTES
RBC.: 2.34 MIL/uL — AB (ref 4.22–5.81)
RETIC COUNT ABSOLUTE: 42.1 10*3/uL (ref 19.0–186.0)
RETIC CT PCT: 1.8 % (ref 0.4–3.1)

## 2016-04-12 LAB — FOLATE: Folate: 23.1 ng/mL (ref >5.9)

## 2016-04-12 MED ORDER — TRAMADOL HCL 50 MG PO TABS: 50.0000 mg | ORAL_TABLET | Freq: Four times a day (QID) | ORAL | Status: DC | PRN

## 2016-04-12 MED ORDER — FERROUS SULFATE 325 (65 FE) MG PO TABS: 325.0000 mg | ORAL_TABLET | Freq: Three times a day (TID) | ORAL | Status: DC

## 2016-04-12 MED ORDER — TRAMADOL HCL 50 MG PO TABS: 50.0000 mg | ORAL_TABLET | Freq: Four times a day (QID) | ORAL | 0 refills | Status: DC | PRN

## 2016-04-12 NOTE — Progress Notes (Signed)
Occupational Therapy Treatment Patient Details Name: [name] MRN: [id number] DOB: 09/12/1950 Today's Date: 04/12/2016    History of present illness 66 y/o M  with ESRD s/p resection of retroperitoneal mass and R hemicolectomy 04/07/16 for recurrent RCC. Complex PMH including bilateral blindness and partial amputation of L foot.   OT comments  Pt and wife eager to d/c home. Pt currently requires incr verbal commands due to blindness to navigate environment. Pt placing a pillow under chin in chair for comfort and wife reports this has been his method of comfort for more than 5 years. Pt could benefit from cervical strengthening due to fatigue with static sitting.    Follow Up Recommendations  Home health OT;Supervision/Assistance - 24 hour    Equipment Recommendations  None recommended by OT    Recommendations for Other Services PT consult;Rehab consult    Precautions / Restrictions Precautions Precautions: Fall Precaution Comments: blind       Mobility Bed Mobility Overal bed mobility: Needs Assistance Bed Mobility: Supine to Sit     Supine to sit: Mod assist;HOB elevated     General bed mobility comments: cues for direction.  assist to help pt come up and over onto L UE and truncal support/assist while pushing up via L UE.  Transfers Overall transfer level: Needs assistance Equipment used: Rolling walker (2 wheeled) Transfers: Sit to/from Stand Sit to Stand: Min assist;+2 physical assistance;+2 safety/equipment         General transfer comment: v/physical cues for direction, hand placement due to blindnes    Balance   Sitting-balance support: No upper extremity supported Sitting balance-Leahy Scale: Fair Sitting balance - Comments: likes to have hold of the rail or bed for environmental cues as well as safety   Standing balance support: Bilateral upper extremity supported;During functional activity Standing balance-Leahy Scale: Poor Standing balance  comment: reliant on the RW or counter at the sink.  Spent periods standing at the sink to work on standing tolerance while doing hygiene task.                   ADL Overall ADL's : Needs assistance/impaired Eating/Feeding: NPO   Grooming: Wash/dry face;Wash/dry hands;Minimal assistance;Sitting   Upper Body Bathing: Moderate assistance;Sitting   Lower Body Bathing: Maximal assistance;Sit to/from stand   Upper Body Dressing : Minimal assistance;Sitting   Lower Body Dressing: Maximal assistance   Toilet Transfer: +2 for physical assistance;Minimal assistance           Functional mobility during ADLs: +2 for physical assistance;Minimal assistance;Rolling walker General ADL Comments: pt completed OOB to chair at sink level adl. pt c/o neck pain and fatigue. pt with full wash up and hair washed . Wife reports at home using microwave bags for hair hygiene at [place] During Therapy: [facility] for tasks assessed/performed Overall Cognitive Status: Within Functional Limits for tasks assessed                       Extremity/Trunk Assessment               Exercises     Shoulder Instructions       General Comments      Pertinent Vitals/ Pain       Pain  Assessment: Faces Pain Score: 6  Faces Pain Scale: Hurts even more Pain Location: neck cervical pain Pain Descriptors / Indicators: Aching;Grimacing;Moaning Pain Intervention(s): Monitored during session;Repositioned  Home Living                                          Prior Functioning/Environment              Frequency  Min 2X/week        Progress Toward Goals  OT Goals(current goals can now be found in the care plan section)  Progress towards OT goals: Progressing toward goals  Acute Rehab OT Goals Patient Stated Goal: go home OT Goal Formulation: With patient/family Time For  Goal Achievement: 04/24/16 Potential to Achieve Goals: Good ADL Goals Pt Will Perform Grooming: with modified independence;sitting Pt Will Perform Upper Body Bathing: sitting;with supervision Pt Will Perform Lower Body Bathing: sit to/from stand;with mod assist Pt Will Transfer to Toilet: with min guard assist;ambulating;bedside commode Pt Will Perform Toileting - Clothing Manipulation and hygiene: with min guard assist;sit to/from stand Pt Will Perform Tub/Shower Transfer: Tub transfer;with min assist;ambulating;shower seat;3 in 1;rolling walker Additional ADL Goal #1: Pt will complete bed mobility at supervision level to prepare for OOB ADLs.   Plan Discharge plan remains appropriate    Co-evaluation    PT/OT/SLP Co-Evaluation/Treatment: Yes Reason for Co-Treatment: Complexity of the patient's impairments (multi-system involvement);Necessary to address cognition/behavior during functional activity;For patient/therapist safety PT goals addressed during session: Mobility/safety with mobility OT goals addressed during session: ADL's and self-care;Strengthening/ROM      End of Session Equipment Utilized During Treatment: Gait belt;Rolling walker   Activity Tolerance Patient tolerated treatment well   Patient Left in chair;with call bell/phone within reach;with family/visitor present;with chair alarm set   Nurse Communication Mobility status;Precautions        Time: 0867-6195 OT Time Calculation (min): 25 min  Charges: OT General Charges $OT Visit: 1 Procedure OT Treatments $Self Care/Home Management : 8-22 mins  Parke Poisson B 04/12/2016, 2:41 PM  Jeri Modena   OTR/L Pager: [phone] Office: [phone] .

## 2016-04-12 NOTE — Progress Notes (Signed)
PROGRESS NOTE    [name]  LDJ:[id number] DOB: [birth year] DOA: 04/07/2016 PCP: [name], MD    Brief Narrative:  66 y/o With ESRD who is pod 5 s/p resection of retroperitoneal mass with right colectomy, liver resection. We were consulted for management of medical problems  Assessment & Plan:   Active Problems:   ESRD (end stage renal disease) on dialysis ([facility])  - dialysis MWF, per nephrology  Anemia - Anemia panel reviewed and patient with iron deficiency - Add ferrous sulfate 325 mg po tid    Renal cell carcinoma (HCC)/Hx of exploratory laparotomy   - per primary - awaiting pathology    Macrocytic anemia    Diarrhea - colonized with c diff. No toxin. No diarrhea reported today. Agree with holding antibiotic for this problem.  DM - continue ssi - diabetic diet   DVT prophylaxis: defer to primary given recent operation Code Status: Full Family Communication: d/c patient and spouse Disposition Plan: once able to discharge by primary no hold up from our end    Procedures: see above and EMR   Antimicrobials: None   Subjective: Pt has no new complaints  Objective: Vitals:   04/11/16 2014 04/11/16 2047 04/12/16 0405 04/12/16 0900  BP: (!) 121/51  (!) 121/51 (!) 125/55  Pulse: 63 93 (!) 59 75  Resp: 19 18 16 18   Temp: 98.3 F (36.8 C) 97.7 F (36.5 C) 98.2 F (36.8 C) 98.2 F (36.8 C)  TempSrc: Oral Oral Oral Oral  SpO2: 97% 95% 99% 99%  Weight:      Height:        Intake/Output Summary (Last 24 hours) at 04/12/16 1646 Last data filed at 04/12/16 1400  Gross per 24 hour  Intake              460 ml  Output                0 ml  Net              460 ml   Filed Weights   04/10/16 2040 04/11/16 1354 04/11/16 1730  Weight: 90.2 kg (198 lb 13.7 oz) 90.5 kg (199 lb 8.3 oz) 90.5 kg (199 lb 8.3 oz)    Examination:  General exam: Appears calm and comfortable, in nad.  Respiratory system: Clear to auscultation. Respiratory effort  normal. Cardiovascular system: no cyanosis Gastrointestinal system: non distended, no guarding Central nervous system: Alert and oriented. No focal neurological deficits. Extremities: moves extremities equally Skin: No rashes, lesions or ulcers, on limited exam. Psychiatry: Judgement and insight appear normal. Mood & affect appropriate.     Data Reviewed: I have personally reviewed following labs and imaging studies  CBC:  Recent Labs Lab 04/07/16 2002 04/08/16 0409 04/09/16 0321 04/10/16 0733 04/11/16 0439 04/12/16 0657  WBC 10.3 9.2 5.6 3.6* 4.0 3.5*  NEUTROABS 9.4* 8.2* 4.8 2.9 2.9  --   HGB 10.4* 9.7* 8.6* 8.1* 8.2* 8.0*  HCT 30.2* 27.6* 25.2* 23.6* 24.2* 24.0*  MCV 101.7* 100.0 102.0* 101.3* 101.3* 102.6*  PLT 116* 119* 89* 73* 91* 91*   Basic Metabolic Panel:  Recent Labs Lab 04/08/16 0409 04/08/16 1624 04/09/16 0321 04/09/16 0341 04/10/16 0733 04/11/16 0439 04/12/16 0657  NA 136 134*  --  134* 138 136 136  K 4.8 4.4  --  4.5 3.6 3.6 4.4  CL 96* 98*  --  96* 101 98* 100*  CO2 25 24  --  24 27 26  28  GLUCOSE 125* 120*  --  79 97 114* 89  BUN 62* 65*  --  73* 40* 49* 23*  CREATININE 5.94* 5.83*  --  7.25* 5.14* 6.59* 4.56*  CALCIUM 9.0 8.2*  --  8.4* 8.5* 8.6* 8.5*  MG 2.8*  --  2.8*  --  2.3 2.5*  --   PHOS 6.8*  --  7.4*  --  4.4 5.9*  --    GFR: Estimated Creatinine Clearance: 19.6 mL/min (by C-G formula based on SCr of 4.56 mg/dL (H)). Liver Function Tests:  Recent Labs Lab 04/08/16 0409  AST 73*  ALT 67*  ALKPHOS 103  BILITOT 0.4  PROT 5.4*  ALBUMIN 3.7   No results for input(s): LIPASE, AMYLASE in the last 168 hours. No results for input(s): AMMONIA in the last 168 hours. Coagulation Profile:  Recent Labs Lab 04/08/16 0409  INR 1.04   Cardiac Enzymes:  Recent Labs Lab 04/07/16 2002 04/08/16 0409  TROPONINI <0.03 <0.03   BNP (last 3 results) No results for input(s): PROBNP in the last 8760 hours. HbA1C:  Recent Labs   04/09/16 2352  HGBA1C 4.8   CBG:  Recent Labs Lab 04/11/16 2006 04/11/16 2351 04/12/16 0358 04/12/16 0758 04/12/16 1219  GLUCAP 146* 100* 101* 83 123*   Lipid Profile: No results for input(s): CHOL, HDL, LDLCALC, TRIG, CHOLHDL, LDLDIRECT in the last 72 hours. Thyroid Function Tests: No results for input(s): TSH, T4TOTAL, FREET4, T3FREE, THYROIDAB in the last 72 hours. Anemia Panel:  Recent Labs  04/12/16 0657  VITAMINB12 1,851*  FOLATE 23.1  FERRITIN 1,350*  TIBC 200*  IRON 26*  RETICCTPCT 1.8   Sepsis Labs:  Recent Labs Lab 04/07/16 2002 04/08/16 0409  LATICACIDVEN 2.0* 1.7    Recent Results (from the past 240 hour(s))  Aerobic/Anaerobic Culture (surgical/deep wound)     Status: None   Collection Time: 04/07/16 10:25 AM  Result Value Ref Range Status   Specimen Description WOUND INTRA ABDOMINAL  Final   Special Requests NONE  Final   Gram Stain   Final    RARE WBC PRESENT, PREDOMINANTLY MONONUCLEAR NO ORGANISMS SEEN    Culture   Final    No growth aerobically or anaerobically. Performed at [facility]    Report Status 04/12/2016 FINAL  Final  MRSA PCR Screening     Status: Abnormal   Collection Time: 04/08/16 12:04 AM  Result Value Ref Range Status   MRSA by PCR INVALID RESULTS, SPECIMEN SENT FOR CULTURE (A) NEGATIVE Final    Comment:        The GeneXpert MRSA Assay (FDA approved for NASAL specimens only), is one component of a comprehensive MRSA colonization surveillance program. It is not intended to diagnose MRSA infection nor to guide or monitor treatment for MRSA infections. RESULT CALLED TO, READ BACK BY AND VERIFIED WITH: JOHNSON RN AT 3016 ON 01.05.18 BY SHUEA   MRSA culture     Status: None   Collection Time: 04/08/16 12:04 AM  Result Value Ref Range Status   Specimen Description NOSE  Final   Special Requests NONE  Final   Culture   Final    NO MRSA DETECTED Performed at [facility]    Report Status 04/10/2016  FINAL  Final  C difficile quick scan w PCR reflex     Status: Abnormal   Collection Time: 04/08/16  6:30 PM  Result Value Ref Range Status   C Diff antigen POSITIVE (A) NEGATIVE Final   C  Diff toxin NEGATIVE NEGATIVE Final   C Diff interpretation Results are indeterminate. See PCR results.  Final  Clostridium Difficile by PCR     Status: Abnormal   Collection Time: 04/08/16  6:30 PM  Result Value Ref Range Status   Toxigenic C Difficile by pcr POSITIVE (A) NEGATIVE Final    Comment: Positive for toxigenic C. difficile with little to no toxin production. Only treat if clinical presentation suggests symptomatic illness. Performed at [facility]          Radiology Studies: No results found.      Scheduled Meds: . acidophilus  2 capsule Oral Daily  . aspirin EC  81 mg Oral Daily  . atorvastatin  10 mg Oral Daily  . calcitRIOL  0.25 mcg Oral Once per day on Mon Wed Fri  . cinacalcet  90 mg Oral Q breakfast  . heparin subcutaneous  5,000 Units Subcutaneous Q12H  . insulin aspart  0-15 Units Subcutaneous Q4H  . midodrine  10 mg Oral TID WC  . sevelamer carbonate  4,800 mg Oral TID WC   Continuous Infusions:   LOS: 5 days   Time spent: > 35 minutes  Velvet Bathe, MD [facility] Pager [phone]  If 7PM-7AM, please contact night-coverage [email or web address] Password [facility] 04/12/2016, 4:46 PM

## 2016-04-12 NOTE — Progress Notes (Signed)
5 Days Post-Op  Subjective: He seems to be doing well, just OOB once yesterday, + BM, has orders for Renal diet, but has not received it so far.  Incision OK + BS.  Objective: Vital signs in last 24 hours: Temp:  [97.7 F (36.5 C)-98.5 F (36.9 C)] 98.2 F (36.8 C) (01/09 0405) Pulse Rate:  [54-93] 59 (01/09 0405) Resp:  [16-19] 16 (01/09 0405) BP: (89-121)/(37-65) 121/51 (01/09 0405) SpO2:  [95 %-99 %] 99 % (01/09 0405) Weight:  [90.5 kg (199 lb 8.3 oz)] 90.5 kg (199 lb 8.3 oz) (01/08 1730) Last BM Date: 04/11/16 820 PO No BM recorded Afebrile, VSS Labs stable Intake/Output from previous day: 01/08 0701 - 01/09 0700 In: 870 [P.O.:820] Out: 0  Intake/Output this shift: No intake/output data recorded.  General appearance: alert, cooperative and no distress GI: soft, + BS and + BM per pt report.  Lab Results:   Recent Labs  04/11/16 0439 04/12/16 0657  WBC 4.0 3.5*  HGB 8.2* 8.0*  HCT 24.2* 24.0*  PLT 91* 91*    BMET  Recent Labs  04/11/16 0439 04/12/16 0657  NA 136 136  K 3.6 4.4  CL 98* 100*  CO2 26 28  GLUCOSE 114* 89  BUN 49* 23*  CREATININE 6.59* 4.56*  CALCIUM 8.6* 8.5*   PT/INR No results for input(s): LABPROT, INR in the last 72 hours.   Recent Labs Lab 04/08/16 0409  AST 73*  ALT 67*  ALKPHOS 103  BILITOT 0.4  PROT 5.4*  ALBUMIN 3.7     Lipase     Component Value Date/Time   LIPASE 28 11/01/2015 1812     Studies/Results: No results found.  Medications: . acidophilus  2 capsule Oral Daily  . aspirin EC  81 mg Oral Daily  . atorvastatin  10 mg Oral Daily  . calcitRIOL  0.25 mcg Oral Once per day on Mon Wed Fri  . cinacalcet  90 mg Oral Q breakfast  . heparin subcutaneous  5,000 Units Subcutaneous Q12H  . insulin aspart  0-15 Units Subcutaneous Q4H  . midodrine  10 mg Oral TID WC  . sevelamer carbonate  4,800 mg Oral TID WC    Assessment/Plan Renal Cell Carcinoma, retroperitoneal mass S/p Procedure(s): Exploratory  laparotomy, Resection of retroperitoneal mass, Right colectomy, Liver resection 04/07/16 Dr. Alinda Money and Dr. Marcello Moores - POD 5 - surgical path pending - drain being removed per urology - multiple BM's yesterday and patient is passing flatus ESRD on HD ? C diff - (1/5) Positive for toxigenic C. difficile with little to no toxin production; patient has been on flagyl; had multiple loose BM's yesterday, none today thus far  ID - ancef periop, cefoxitin 1/4>>1/8, flagyl 1/6>> last dose 04/10/16 FEN - IVF, renal diet VTE - heparin, SCDs  Plan - Advance to renal/carb mod diet. PT evaluation: home health . Mobilize/out of bed.   LOS: 5 days    [name] 04/12/2016 [id number]

## 2016-04-12 NOTE — Progress Notes (Signed)
Physical Therapy Treatment Patient Details Name: [name] MRN: [id number] DOB: [birth year] Today's Date: 04/12/2016    History of Present Illness 66 y/o M  with ESRD s/p resection of retroperitoneal mass and R hemicolectomy 04/07/16 for recurrent RCC. Complex PMH including bilateral blindness and partial amputation of L foot.    PT Comments    Pt progressing steadily with general mobility.  Emphasis on standing tasks, transfers and gait in a home-like environment with pt needing v/physical cues for direction due to blindness.  Follow Up Recommendations  Home health PT;Supervision for mobility/OOB     Equipment Recommendations  None recommended by PT    Recommendations for Other Services       Precautions / Restrictions Precautions Precautions: Fall Precaution Comments: blind    Mobility  Bed Mobility Overal bed mobility: Needs Assistance Bed Mobility: Supine to Sit     Supine to sit: Mod assist;HOB elevated     General bed mobility comments: cues for direction.  assist to help pt come up and over onto L UE and truncal support/assist while pushing up via L UE.  Transfers Overall transfer level: Needs assistance Equipment used: Rolling walker (2 wheeled) Transfers: Sit to/from Stand Sit to Stand: Min assist;+2 physical assistance;+2 safety/equipment         General transfer comment: v/physical cues for direction, hand placement due to blindnes  Ambulation/Gait Ambulation/Gait assistance: Min assist;+2 safety/equipment Ambulation Distance (Feet): 8 Feet (then additional 12 feet around the bed) Assistive device: Rolling walker (2 wheeled) Gait Pattern/deviations: Step-through pattern;Decreased stride length Gait velocity: dereased Gait velocity interpretation: Below normal speed for age/gender General Gait Details: needed verbal and physical guidance for unfamiliar environment.  pt's knees consistently flexed 5-10 degrees in part due to tight hams.  pt needed  occasional truncal assist and assist maneuvering the RW   Stairs            Wheelchair Mobility    Modified Rankin (Stroke Patients Only)       Balance   Sitting-balance support: No upper extremity supported Sitting balance-Leahy Scale: Fair Sitting balance - Comments: likes to have hold of the rail or bed for environmental cues as well as safety   Standing balance support: Bilateral upper extremity supported;During functional activity Standing balance-Leahy Scale: Poor Standing balance comment: reliant on the RW or counter at the sink.  Spent periods standing at the sink to work on standing tolerance while doing hygiene task.                    Cognition Arousal/Alertness: Awake/alert Behavior During Therapy: WFL for tasks assessed/performed Overall Cognitive Status: Within Functional Limits for tasks assessed                      Exercises      General Comments        Pertinent Vitals/Pain Pain Assessment: Faces Faces Pain Scale: Hurts even more Pain Location: neck/cervical spine Pain Descriptors / Indicators: Aching;Grimacing;Moaning Pain Intervention(s): Monitored during session;Limited activity within patient's tolerance;Repositioned    Home Living                      Prior Function            PT Goals (current goals can now be found in the care plan section) Acute Rehab PT Goals Patient Stated Goal: go home PT Goal Formulation: With patient/family Time For Goal Achievement: 04/25/16 Potential to Achieve Goals: Good Progress towards  PT goals: Progressing toward goals    Frequency    Min 3X/week      PT Plan Current plan remains appropriate    Co-evaluation   Reason for Co-Treatment: Complexity of the patient's impairments (multi-system involvement) PT goals addressed during session: Mobility/safety with mobility       End of Session   Activity Tolerance: Patient tolerated treatment well Patient left: in  chair;with call bell/phone within reach;with chair alarm set;with family/visitor present     Time: 1100-1133 PT Time Calculation (min) (ACUTE ONLY): 33 min  Charges:  $Therapeutic Activity: 8-22 mins                    G CodesTessie Fass Pascuala Klutts 04/12/2016, 12:24 PM 04/12/2016  Donnella Sham, PT [phone]  (pager)

## 2016-04-12 NOTE — Progress Notes (Signed)
Enteric precaution order entered. As per policy, patient with a positive test should remain on ENTERIC precautions for 30 days.  Luther Hearing RN BSN Infection Prevention

## 2016-04-12 NOTE — Progress Notes (Signed)
  [facility] Progress Note   Subjective: awake, get up to chair yest, taking CL's no solids yet, no n/v/ pain  Vitals:   04/11/16 2014 04/11/16 2047 04/12/16 0405 04/12/16 0900  BP: (!) 121/51  (!) 121/51 (!) 125/55  Pulse: 63 93 (!) 59 75  Resp: 19 18 16 18   Temp: 98.3 F (36.8 C) 97.7 F (36.5 C) 98.2 F (36.8 C) 98.2 F (36.8 C)  TempSrc: Oral Oral Oral Oral  SpO2: 97% 95% 99% 99%  Weight:      Height:        Inpatient medications: . acidophilus  2 capsule Oral Daily  . aspirin EC  81 mg Oral Daily  . atorvastatin  10 mg Oral Daily  . calcitRIOL  0.25 mcg Oral Once per day on Mon Wed Fri  . cinacalcet  90 mg Oral Q breakfast  . heparin subcutaneous  5,000 Units Subcutaneous Q12H  . insulin aspart  0-15 Units Subcutaneous Q4H  . midodrine  10 mg Oral TID WC  . sevelamer carbonate  4,800 mg Oral TID WC    acetaminophen, diphenhydrAMINE, ondansetron, sevelamer carbonate, traMADol  Exam: No distress, eyes closed (blind) NO jvd Chest clear bilat RRR no mrg Abd soft ntnd large RUQ wound intact w staples Ext no edema LUE AVF+ bruit NF, ox 3 , blind  Dialysis: MWF [facility] 4.5h   90kg 2/2 bath   R IJ tdc/ maturing LUA AVF (10/10)  Hep 8000  - Calc 1.75 ug , last pth 493, phos 5  - Mirc 100 last 12/27       Assessment: 1. ESRD cont HD MWF via [place] 2. Recurrent RCC s/p resection (tumor resection w R hemicolectomy/ partial liver resection/ walled-off abscess drainage 04/08/15) 3. CDI on Flagyl - toxin neg 4. CAD hx CABG 5. Anemia 6. 2HPTH 7. Vol at dry wt  Plan - HD tomorrow, min UF   Kelly Splinter MD [place] pager [phone]   04/12/2016, 10:38 AM    Recent Labs Lab 04/09/16 0321  04/10/16 0733 04/11/16 0439 04/12/16 0657  NA  --   < > 138 136 136  K  --   < > 3.6 3.6 4.4  CL  --   < > 101 98* 100*  CO2  --   < > 27 26 28   GLUCOSE  --   < > 97 114* 89  BUN  --   < > 40* 49* 23*  CREATININE  --   < > 5.14* 6.59* 4.56*   CALCIUM  --   < > 8.5* 8.6* 8.5*  PHOS 7.4*  --  4.4 5.9*  --   < > = values in this interval not displayed.  Recent Labs Lab 04/08/16 0409  AST 73*  ALT 67*  ALKPHOS 103  BILITOT 0.4  PROT 5.4*  ALBUMIN 3.7    Recent Labs Lab 04/09/16 0321 04/10/16 0733 04/11/16 0439 04/12/16 0657  WBC 5.6 3.6* 4.0 3.5*  NEUTROABS 4.8 2.9 2.9  --   HGB 8.6* 8.1* 8.2* 8.0*  HCT 25.2* 23.6* 24.2* 24.0*  MCV 102.0* 101.3* 101.3* 102.6*  PLT 89* 73* 91* 91*   Iron/TIBC/Ferritin/ %Sat    Component Value Date/Time   IRON 26 (L) 04/12/2016 0657   TIBC 200 (L) 04/12/2016 0657   FERRITIN 1,350 (H) 04/12/2016 0657   IRONPCTSAT 13 (L) 04/12/2016 8786

## 2016-04-12 NOTE — Progress Notes (Signed)
Patient ID: [name], male   DOB: Oct 23, 1950, 66 y.o.   MRN: [id number]  5 Days Post-Op Subjective: Pt doing well over last 24 hrs without new complaints.  Received HD yesterday.  He has tolerated advancement of his diet to full liquids yesterday.  Had one episode of nausea last night associated with coughing episode but otherwise denies and GI upset.  Internal medicine has recommended d/c of metronidazole considering his frequent loose stools are consistent with his baseline bowel function even before surgery/hospital admission.  This was confirmed with his wife last evening.  Labs ordered for 5 AM this morning not yet drawn.  Objective: Vital signs in last 24 hours: Temp:  [97.7 F (36.5 C)-98.5 F (36.9 C)] 98.2 F (36.8 C) (01/09 0405) Pulse Rate:  [54-93] 59 (01/09 0405) Resp:  [16-19] 16 (01/09 0405) BP: (89-121)/(37-65) 121/51 (01/09 0405) SpO2:  [95 %-99 %] 99 % (01/09 0405) Weight:  [90.5 kg (199 lb 8.3 oz)] 90.5 kg (199 lb 8.3 oz) (01/08 1730)  Intake/Output from previous day: 01/08 0701 - 01/09 0700 In: 870 [P.O.:820] Out: 0  Intake/Output this shift: Total I/O In: 100 [P.O.:100] Out: 0   Physical Exam:  General: Alert and oriented CV: RRR Lungs: Clear Abdomen: Soft, ND, Minimal BS Incisions: Dressing removed this morning. C/D/I.  Staples intact. Ext: NT, No erythema  Lab Results:  Recent Labs  04/10/16 0733 04/11/16 0439  HGB 8.1* 8.2*  HCT 23.6* 24.2*    BMET  Recent Labs  04/10/16 0733 04/11/16 0439  NA 138 136  K 3.6 3.6  CL 101 98*  CO2 27 26  GLUCOSE 97 114*  BUN 40* 49*  CREATININE 5.14* 6.59*  CALCIUM 8.5* 8.6*     Studies/Results: No results found.  Assessment/Plan: POD #5 s/p exploratory laparotomy with resection of retroperitoneal mass (confirmed to be clear cell carcinoma by biopsy) requiring partial liver resection and right colectomy.  1) CV: Continue midodrine per usual dose. HD stable. AM cortisol normal. 2) Renal:  Per nephrology.  Now has resumed normal scheduled of intermittent HD M-W-F.  If he is ok for discharge today, can likely resume outpatient dialysis tomorrow unless nephrology feels he should receive HD inpatient tomorrow before discharge. 3) FEN: BMP pending.  Tolerating full liquids.  Will advance to renal diet today pending other recommendations by General Surgery. 4) ID: Culture from intraoperative negative c/w sterile abscess likely from prior infection following last surgery.  Have discontinued metronidazole per IM recommendations as patient without C diff toxin production and no other signs of clinical infection. Pt on probiotics and 5) Post-op care: Continue [place] heparin for DVT prophylaxis.  PT/OT consults done yesterday.  Home health PT/OT recommended.  CSW to work on arrangements. 6) Oncology: Pathology pending. 7) Disp: Patient is doing well and potentially ready for discharge later today pending arrangements for PT/OT home health, toleration of diet advancement, and agreement with other service (Nephrology, General Surgery, Internal Medicine).  If unable to be discharged today, will plan for tomorrow after HD here as an alternative plan.    LOS: 5 days   Brandee Markin,LES 04/12/2016, 6:47 AM

## 2016-04-13 DIAGNOSIS — R197 Diarrhea, unspecified: Secondary | ICD-10-CM

## 2016-04-13 DIAGNOSIS — D539 Nutritional anemia, unspecified: Secondary | ICD-10-CM

## 2016-04-13 DIAGNOSIS — C649 Malignant neoplasm of unspecified kidney, except renal pelvis: Secondary | ICD-10-CM

## 2016-04-13 LAB — GLUCOSE, CAPILLARY
GLUCOSE-CAPILLARY: 75 mg/dL (ref 65–99)
Glucose-Capillary: 94 mg/dL (ref 65–99)
Glucose-Capillary: 103 mg/dL — ABNORMAL HIGH (ref 65–99)

## 2016-04-13 MED ORDER — PENTAFLUOROPROP-TETRAFLUOROETH EX AERO: 1.0000 "application " | INHALATION_SPRAY | CUTANEOUS | Status: DC | PRN

## 2016-04-13 MED ORDER — SODIUM CHLORIDE 0.9 % IV SOLN: 100.0000 mL | INTRAVENOUS | Status: DC | PRN

## 2016-04-13 MED ORDER — FERROUS SULFATE 325 (65 FE) MG PO TABS: 325.0000 mg | ORAL_TABLET | Freq: Three times a day (TID) | ORAL | 3 refills | Status: DC

## 2016-04-13 MED ORDER — ALTEPLASE 2 MG IJ SOLR
2.0000 mg | Freq: Once | INTRAMUSCULAR | Status: DC | PRN
Start: 2016-04-13 — End: 2016-04-13

## 2016-04-13 MED ORDER — METRONIDAZOLE 500 MG PO TABS: 500.0000 mg | ORAL_TABLET | Freq: Three times a day (TID) | ORAL | 0 refills | Status: AC

## 2016-04-13 MED ORDER — FERROUS SULFATE 325 (65 FE) MG PO TABS: 325.0000 mg | ORAL_TABLET | Freq: Every day | ORAL | 3 refills | Status: DC

## 2016-04-13 MED ORDER — HEPARIN SODIUM (PORCINE) 1000 UNIT/ML DIALYSIS: 1000.0000 [IU] | INTRAMUSCULAR | Status: DC | PRN

## 2016-04-13 MED ORDER — LIDOCAINE-PRILOCAINE 2.5-2.5 % EX CREA: 1.0000 "application " | TOPICAL_CREAM | CUTANEOUS | Status: DC | PRN

## 2016-04-13 MED ORDER — LIDOCAINE HCL (PF) 1 % IJ SOLN: 5.0000 mL | INTRAMUSCULAR | Status: DC | PRN

## 2016-04-13 MED ORDER — ALTEPLASE 2 MG IJ SOLR: 2.0000 mg | Freq: Once | INTRAMUSCULAR | Status: DC | PRN

## 2016-04-13 NOTE — Discharge Instructions (Signed)
1.  Activity:  You are encouraged to ambulate frequently (about every hour during waking hours) to help prevent blood clots from forming in your legs or lungs.  However, you should not engage in any heavy lifting (> 10-15 lbs), strenuous activity, or straining. 2. Diet: You should advance your diet as instructed by your physician.  It will be normal to have some bloating, nausea, and abdominal discomfort intermittently. 3. Prescriptions:  You will be provided a prescription for pain medication to take as needed.  If your pain is not severe enough to require the prescription pain medication, you may take extra strength Tylenol instead which will have less side effects.  You should also take a prescribed stool softener to avoid straining with bowel movements as the prescription pain medication may constipate you. 4. Incisions: You may remove your dressing bandages 48 hours after surgery if not removed in the hospital.  You will either have some small staples or special tissue glue at each of the incision sites. Once the bandages are removed (if present), the incisions may stay open to air.  You may start showering (but not soaking or bathing in water) the 2nd day after surgery and the incisions simply need to be patted dry after the shower.  No additional care is needed. Your staples will be removed at your first postop appointment. 5. What to call us about: You should call the office [phone]) if you develop fever > 101 or develop persistent vomiting.

## 2016-04-13 NOTE — Care Management Note (Signed)
Case Management Note  Patient Details  Name: WOODSON MACHA MRN: [id number] Date of Birth: 18-Aug-1950  Subjective/Objective:      CM following for progression and d/c planning.               Action/Plan: 04/13/2016 Per pt RN this pt wife has called and states that she prefers [facility] for [facility] services . [place] notified, of need for [place] [facility] and HHPT.  Pt has aide services however pt and wife will need to manage this service as this is not part to medicare services.   Expected Discharge Date:     04/13/2016             Expected Discharge Plan:  [place]  In-House Referral:  NA  Discharge planning Services  CM Consult  Post Acute Care Choice:  Home Health Choice offered to:  Spouse  DME Arranged:  N/A DME Agency:  NA  HH Arranged:  RN, PT [place] Agency:  [place] [place]  Status of Service:  Completed, signed off  If discussed at [place] of Stay Meetings, dates discussed:    Additional Comments:  Adron Bene, RN 04/13/2016, 11:36 AM

## 2016-04-13 NOTE — Procedures (Signed)
Doing well, not taking any pain medication.  For dc home after HD today.  Min UF on HD, has not been eating much.   I was present at this dialysis session, have reviewed the session itself and made  appropriate changes Kelly Splinter MD [place] pager [phone]   04/13/2016, 9:37 AM

## 2016-04-13 NOTE — Progress Notes (Signed)
Patient ID: [name], male   DOB: [birth year], 66 y.o.   MRN: [id number]  [facility] Surgery Progress Note  6 Days Post-Op  Subjective: No new complaints. Patient states he is being discharged today. He completed HD this morning.  Tolerating diet. Had BM yesterday.  Objective: Vital signs in last 24 hours: Temp:  [98 F (36.7 C)-98.8 F (37.1 C)] 98 F (36.7 C) (01/10 1025) Pulse Rate:  [57-80] 64 (01/10 1025) Resp:  [14-18] 15 (01/10 1025) BP: (90-130)/(49-67) 111/56 (01/10 1025) SpO2:  [95 %-100 %] 98 % (01/10 1025) Weight:  [205 lb 14.6 oz (93.4 kg)-212 lb 8.4 oz (96.4 kg)] 205 lb 14.6 oz (93.4 kg) (01/10 1000) Last BM Date: 04/11/16  Intake/Output from previous day: 01/09 0701 - 01/10 0700 In: 360 [P.O.:360] Out: 0  Intake/Output this shift: No intake/output data recorded.  PE: Gen:  Alert, NAD, pleasant Pulm:  Effort normal Abd: Soft, NT/ND, right lateral incision C/D/I with staples intact  Lab Results:   Recent Labs  04/11/16 0439 04/12/16 0657  WBC 4.0 3.5*  HGB 8.2* 8.0*  HCT 24.2* 24.0*  PLT 91* 91*   BMET  Recent Labs  04/11/16 0439 04/12/16 0657  NA 136 136  K 3.6 4.4  CL 98* 100*  CO2 26 28  GLUCOSE 114* 89  BUN 49* 23*  CREATININE 6.59* 4.56*  CALCIUM 8.6* 8.5*   PT/INR No results for input(s): LABPROT, INR in the last 72 hours. CMP     Component Value Date/Time   NA 136 04/12/2016 0657   K 4.4 04/12/2016 0657   CL 100 (L) 04/12/2016 0657   CO2 28 04/12/2016 0657   GLUCOSE 89 04/12/2016 0657   BUN 23 (H) 04/12/2016 0657   CREATININE 4.56 (H) 04/12/2016 0657   CREATININE 4.43 (H) 09/29/2014 1422   CALCIUM 8.5 (L) 04/12/2016 0657   PROT 5.4 (L) 04/08/2016 0409   ALBUMIN 3.7 04/08/2016 0409   AST 73 (H) 04/08/2016 0409   ALT 67 (H) 04/08/2016 0409   ALKPHOS 103 04/08/2016 0409   BILITOT 0.4 04/08/2016 0409   GFRNONAA 12 (L) 04/12/2016 0657   GFRNONAA 13 (L) 09/29/2014 1422   GFRAA 14 (L) 04/12/2016 0657   GFRAA 15  (L) 09/29/2014 1422   Lipase     Component Value Date/Time   LIPASE 28 11/01/2015 1812       Studies/Results: No results found.  Anti-infectives: Anti-infectives    Start     Dose/Rate Route Frequency Ordered Stop   04/09/16 1700  metroNIDAZOLE (FLAGYL) tablet 500 mg  Status:  Discontinued     500 mg Oral Every 8 hours 04/09/16 1601 04/11/16 1047   04/07/16 2000  cefOXitin (MEFOXIN) 1 g in dextrose 5 % 50 mL IVPB  Status:  Discontinued     1 g 100 mL/hr over 30 Minutes Intravenous Every 24 hours 04/07/16 1411 04/11/16 0725   04/07/16 1330  cefOXitin (MEFOXIN) 1 g in dextrose 5 % 50 mL IVPB  Status:  Discontinued     1 g 100 mL/hr over 30 Minutes Intravenous Every 8 hours 04/07/16 1329 04/07/16 1412   04/07/16 0559  ceFAZolin (ANCEF) IVPB 2g/100 mL premix     2 g 200 mL/hr over 30 Minutes Intravenous 30 min pre-op 04/07/16 0559 04/07/16 0730       Assessment/Plan Renal Cell Carcinoma, retroperitoneal mass S/p Procedure(s): Exploratory laparotomy, Resection of retroperitoneal mass, Right colectomy, Liver resection1/4/18 Dr. Alinda Money and Dr. Marcello Moores - POD 6 - surgical  path: 1. Soft tissue, biopsy, hepatic margin CLEAR CELL RENAL CELL CARCINOMA(1.5 CM), WHO NUCLEAR GRADE 2 ADHESION TO LIVER PARENCHYMA HEPATIC RESECTION MARGIN IS NEGATIVE FOR CARCINOMA 2. Colon, segmental resection, ascending ASCENDING COLON WITH SEROSA ADHESION AND REACTIVE CHANGES NO EVIDENCE OF CARCINOMA HISTOLOGICAL UNREMARKABLE APPENDIX TWO BENIGN LYMPH NODES (0/2) 3. Soft tissue tumor, extensive resection, retroperitoneal CLEAR CELL RENAL CELL CARCINOMA (5.5 CM), WHO NUCLEAR GRADE 2 INVOLVING COLONIC WALL AND RETROPERITONEAL FIBROADIPOSE TISSUE COLONIC RESECTION MARGINS ARE NEGATIVE FOR CARCINOMA  ESRD on HD - completed HD this morning ? C diff - (1/5) Positive for toxigenic C. difficile with little to no toxin production; patient has been on flagyl; he has not had numerous watery BM's  ID -  ancef periop, cefoxitin 1/4>>1/8, flagyl 1/6>>04/10/16 FEN - renal diet VTE - heparin, SCDs  Plan - Patient being discharged today. Bowels are functioning and he is tolerating renal diet. He has urology follow-up next week (staples can be removed at that appointment), and he will follow-up with Dr. Marcello Moores in 3 weeks. He will have CMP drawn prior to that appointment.   LOS: 6 days    Jerrye Beavers , [facility] Surgery 04/13/2016, 11:07 AM Pager: [phone] Consults: [phone] Mon-Fri 7:00 am-4:30 pm Sat-Sun 7:00 am-11:30 am

## 2016-04-13 NOTE — Progress Notes (Signed)
Pt discharge instructions and Ultram prescription given.  Pt left floor via wheelchair accompanied by staff and family.

## 2016-04-13 NOTE — Progress Notes (Signed)
Consult PROGRESS NOTE    [name]  EXB:[id number] DOB: [birth year] DOA: 04/07/2016 PCP: [name], MD    Brief Narrative:  66 y/o With ESRD who is pod 5 s/p resection of retroperitoneal mass with right colectomy, liver resection. We were consulted for management of medical problems  Assessment & Plan:   Active Problems:   ESRD (end stage renal disease) on dialysis ([place])  - dialysis MWF, per nephrology  Anemia( Macrocytic anemia) -anemia likely multifactorial including anemia of chronic disease and iron deficiency, b12 and folate unremarkable, pmd to consider tsh testing. - ferrous sulfate 325 mg po daily, nephrology follow up    Renal cell carcinoma (HCC)/Hx of exploratory laparotomy   - per primary - awaiting pathology     Diarrhea - colonized with c diff. No toxin.  -due to symptom, will treat with flagyl for 10 days.   DM, a1c 4.8 - diet controlled, not on meds - diabetic diet   DVT prophylaxis: defer to primary given recent operation Code Status: Full Family Communication: d/c patient and spouse Disposition Plan: once able to discharge by primary no hold up from our end, home health to be arranged by case manager    Procedures: see above and EMR   Antimicrobials: None   Subjective: Pt has no new complaints  Objective: Vitals:   04/13/16 0900 04/13/16 0930 04/13/16 1000 04/13/16 1025  BP: (!) 90/58 (!) 97/53 (!) 92/55 (!) 111/56  Pulse: 62 61 60 64  Resp: 16 14 14 15   Temp:   98.3 F (36.8 C) 98 F (36.7 C)  TempSrc:   Oral Axillary  SpO2:   97% 98%  Weight:   93.4 kg (205 lb 14.6 oz)   Height:        Intake/Output Summary (Last 24 hours) at 04/13/16 1204 Last data filed at 04/12/16 1800  Gross per 24 hour  Intake              240 ml  Output                0 ml  Net              240 ml   Filed Weights   04/11/16 1730 04/13/16 0620 04/13/16 1000  Weight: 90.5 kg (199 lb 8.3 oz) 96.4 kg (212 lb 8.4 oz) 93.4 kg (205 lb 14.6 oz)     Examination:  General exam: Appears calm and comfortable, in nad. Baseline blindness ( report for the last 15years) Respiratory system: Clear to auscultation. Respiratory effort normal. Cardiovascular system: no cyanosis Gastrointestinal system: non distended, no guarding Central nervous system: Alert and oriented. No focal neurological deficits. Extremities: moves extremities equally Skin: No rashes, lesions or ulcers, on limited exam. Psychiatry: Judgement and insight appear normal. Mood & affect appropriate.     Data Reviewed: I have personally reviewed following labs and imaging studies  CBC:  Recent Labs Lab 04/07/16 2002 04/08/16 0409 04/09/16 0321 04/10/16 0733 04/11/16 0439 04/12/16 0657  WBC 10.3 9.2 5.6 3.6* 4.0 3.5*  NEUTROABS 9.4* 8.2* 4.8 2.9 2.9  --   HGB 10.4* 9.7* 8.6* 8.1* 8.2* 8.0*  HCT 30.2* 27.6* 25.2* 23.6* 24.2* 24.0*  MCV 101.7* 100.0 102.0* 101.3* 101.3* 102.6*  PLT 116* 119* 89* 73* 91* 91*   Basic Metabolic Panel:  Recent Labs Lab 04/08/16 0409 04/08/16 1624 04/09/16 0321 04/09/16 0341 04/10/16 0733 04/11/16 0439 04/12/16 0657  NA 136 134*  --  134* 138 136 136  K  4.8 4.4  --  4.5 3.6 3.6 4.4  CL 96* 98*  --  96* 101 98* 100*  CO2 25 24  --  24 27 26 28   GLUCOSE 125* 120*  --  79 97 114* 89  BUN 62* 65*  --  73* 40* 49* 23*  CREATININE 5.94* 5.83*  --  7.25* 5.14* 6.59* 4.56*  CALCIUM 9.0 8.2*  --  8.4* 8.5* 8.6* 8.5*  MG 2.8*  --  2.8*  --  2.3 2.5*  --   PHOS 6.8*  --  7.4*  --  4.4 5.9*  --    GFR: Estimated Creatinine Clearance: 19.6 mL/min (by C-G formula based on SCr of 4.56 mg/dL (H)). Liver Function Tests:  Recent Labs Lab 04/08/16 0409  AST 73*  ALT 67*  ALKPHOS 103  BILITOT 0.4  PROT 5.4*  ALBUMIN 3.7   No results for input(s): LIPASE, AMYLASE in the last 168 hours. No results for input(s): AMMONIA in the last 168 hours. Coagulation Profile:  Recent Labs Lab 04/08/16 0409  INR 1.04   Cardiac  Enzymes:  Recent Labs Lab 04/07/16 2002 04/08/16 0409  TROPONINI <0.03 <0.03   BNP (last 3 results) No results for input(s): PROBNP in the last 8760 hours. HbA1C: No results for input(s): HGBA1C in the last 72 hours. CBG:  Recent Labs Lab 04/12/16 2020 04/12/16 2358 04/13/16 0405 04/13/16 0717 04/13/16 1152  GLUCAP 157* 91 75 94 103*   Lipid Profile: No results for input(s): CHOL, HDL, LDLCALC, TRIG, CHOLHDL, LDLDIRECT in the last 72 hours. Thyroid Function Tests: No results for input(s): TSH, T4TOTAL, FREET4, T3FREE, THYROIDAB in the last 72 hours. Anemia Panel:  Recent Labs  04/12/16 0657  VITAMINB12 1,851*  FOLATE 23.1  FERRITIN 1,350*  TIBC 200*  IRON 26*  RETICCTPCT 1.8   Sepsis Labs:  Recent Labs Lab 04/07/16 2002 04/08/16 0409  LATICACIDVEN 2.0* 1.7    Recent Results (from the past 240 hour(s))  Aerobic/Anaerobic Culture (surgical/deep wound)     Status: None   Collection Time: 04/07/16 10:25 AM  Result Value Ref Range Status   Specimen Description WOUND INTRA ABDOMINAL  Final   Special Requests NONE  Final   Gram Stain   Final    RARE WBC PRESENT, PREDOMINANTLY MONONUCLEAR NO ORGANISMS SEEN    Culture   Final    No growth aerobically or anaerobically. Performed at [facility]    Report Status 04/12/2016 FINAL  Final  MRSA PCR Screening     Status: Abnormal   Collection Time: 04/08/16 12:04 AM  Result Value Ref Range Status   MRSA by PCR INVALID RESULTS, SPECIMEN SENT FOR CULTURE (A) NEGATIVE Final    Comment:        The GeneXpert MRSA Assay (FDA approved for NASAL specimens only), is one component of a comprehensive MRSA colonization surveillance program. It is not intended to diagnose MRSA infection nor to guide or monitor treatment for MRSA infections. RESULT CALLED TO, READ BACK BY AND VERIFIED WITH: JOHNSON RN AT 4097 ON 01.05.18 BY SHUEA   MRSA culture     Status: None   Collection Time: 04/08/16 12:04 AM  Result  Value Ref Range Status   Specimen Description NOSE  Final   Special Requests NONE  Final   Culture   Final    NO MRSA DETECTED Performed at [facility]    Report Status 04/10/2016 FINAL  Final  C difficile quick scan w PCR reflex  Status: Abnormal   Collection Time: 04/08/16  6:30 PM  Result Value Ref Range Status   C Diff antigen POSITIVE (A) NEGATIVE Final   C Diff toxin NEGATIVE NEGATIVE Final   C Diff interpretation Results are indeterminate. See PCR results.  Final  Clostridium Difficile by PCR     Status: Abnormal   Collection Time: 04/08/16  6:30 PM  Result Value Ref Range Status   Toxigenic C Difficile by pcr POSITIVE (A) NEGATIVE Final    Comment: Positive for toxigenic C. difficile with little to no toxin production. Only treat if clinical presentation suggests symptomatic illness. Performed at [facility]          Radiology Studies: No results found.      Scheduled Meds: . acidophilus  2 capsule Oral Daily  . aspirin EC  81 mg Oral Daily  . atorvastatin  10 mg Oral Daily  . calcitRIOL  0.25 mcg Oral Once per day on Mon Wed Fri  . cinacalcet  90 mg Oral Q breakfast  . ferrous sulfate  325 mg Oral TID WC  . heparin subcutaneous  5,000 Units Subcutaneous Q12H  . insulin aspart  0-15 Units Subcutaneous Q4H  . midodrine  10 mg Oral TID WC  . sevelamer carbonate  4,800 mg Oral TID WC   Continuous Infusions:   LOS: 6 days   Time spent: > 35 minutes  Yvonda Fouty, MD PhD [facility] Pager [phone]  If 7PM-7AM, please contact night-coverage [email or web address] Password [email or web address] 04/13/2016, 12:04 PM

## 2016-04-13 NOTE — Progress Notes (Signed)
Patient ID: [name], male   DOB: 1951-02-13, 66 y.o.   MRN: [id number]  6 Days Post-Op Subjective: Pt tolerated regular diet yesterday.  No new complaints.  Currently receiving HD.  Not requiring pain medication.  Objective: Vital signs in last 24 hours: Temp:  [98.2 F (36.8 C)-98.8 F (37.1 C)] 98.2 F (36.8 C) (01/10 0410) Pulse Rate:  [67-80] 67 (01/10 0410) Resp:  [18] 18 (01/10 0410) BP: (104-130)/(50-56) 104/56 (01/10 0410) SpO2:  [95 %-100 %] 95 % (01/10 0410)  Intake/Output from previous day: 01/09 0701 - 01/10 0700 In: 360 [P.O.:360] Out: 0  Intake/Output this shift: No intake/output data recorded.  Physical Exam:  General: Alert and oriented Abdomen: Soft, ND Incisions: C/D/I Ext: NT, No erythema  Lab Results:  Recent Labs  04/10/16 0733 04/11/16 0439 04/12/16 0657  HGB 8.1* 8.2* 8.0*  HCT 23.6* 24.2* 24.0*   BMET  Recent Labs  04/11/16 0439 04/12/16 0657  NA 136 136  K 3.6 4.4  CL 98* 100*  CO2 26 28  GLUCOSE 114* 89  BUN 49* 23*  CREATININE 6.59* 4.56*  CALCIUM 8.6* 8.5*     Studies/Results: No results found.  Assessment/Plan: POD #6s/p exploratory laparotomy with resection of retroperitoneal mass (confirmed to be clear cell carcinoma by biopsy) requiring partial liver resection and right colectomy.  1)  Renal: Per nephrology. Receiving HD now.  Will resume outpatient dialysis on Friday. 2) Oncology: Pathology pending.  Will call patient with result once back. 3) Disp: Discharge home after dialysis this morning.    LOS: 6 days   Murdis Flitton,LES 04/13/2016, 6:34 AM

## 2016-04-13 NOTE — Discharge Summary (Signed)
Date of admission: 04/07/2016  Date of discharge: 04/13/2016  Admission diagnosis: Renal cell carcinoma  Discharge diagnosis: Renal cell carcinoma  Secondary diagnoses: Diabetes, ESRD, chronic hypotension  History and Physical: For full details, please see admission history and physical. Briefly, [name] is a 66 y.o. year old patient with a residual mass in his right renal fossa confirmed by biopsy to be clear cell carcinoma.  He has a very complicated urologic history with multiple prior cryoablation procedures bilaterally for renal cell carcinoma and a right nephrectomy at an outside institution with complications including bleeding and postoperative severe infection.  After confirming his residual disease as malignancy, he elected to undergo surgical resection.   Hospital Course: He underwent exploratory laparotomy and resection of his right retroperitoneal mass (confirmed to be clear cell carcinoma preoperatively).  Intraoperatively, his mass was noted to be densely adherent to the colon and liver.  A general surgery consultation was obtained and he required partial liver resection and a right colectomy to adequately remove the mass.  Ultimately, his pathology was noted to be clear cell carcinoma as expected.  Postoperatively, he was hypotensive.  This was likely multifactorial although he has chronic hypotension requiring oral midodrine.  He was admitted to the ICU and CCM was consulted to manage IV pressors to maintain his blood pressure. He was able to wean off IV phenylephrine and resume oral midodrine on POD #3.  He was then transferred to [facility] to receive hemodialysis for his ESRD.  He continued to progress with his diet after his bowel function resumed.  There was a question of C diff although he was not producing actual toxin.  It was determined that his bowel frequency was consistent with his baseline and Internal Medicine recommended stopping metronidazole which had been  originally started in the ICU per CCM.  By POD #6, he has received consults with PT and OT and was recommended for home health PT and OT.  He was stable for discharge home after he received HD on 04/13/16.  Laboratory values:  Recent Labs  04/10/16 0733 04/11/16 0439 04/12/16 0657  HGB 8.1* 8.2* 8.0*  HCT 23.6* 24.2* 24.0*    Recent Labs  04/11/16 0439 04/12/16 0657  CREATININE 6.59* 4.56*    Disposition: Home    Discharge medications:  Allergies as of 04/13/2016      Reactions   Codeine Other (See Comments)    STATES MAKES HIM COMATOSE   Tape Other (See Comments)   Plastic tape tears skin off, please use paper tape instead.      Medication List    TAKE these medications   ALIGN 4 MG Caps Take 1 capsule by mouth daily.   aspirin EC 81 MG tablet Take 81 mg by mouth at bedtime.   atorvastatin 10 MG tablet Commonly known as:  LIPITOR TAKE 1 TABLET BY MOUTH EVERY DAY What changed:  See the new instructions.   cinacalcet 90 MG tablet Commonly known as:  SENSIPAR Take 90 mg by mouth daily.   ferrous sulfate 325 (65 FE) MG tablet Take 1 tablet (325 mg total) by mouth 3 (three) times daily with meals.   HYDROcodone-acetaminophen 5-325 MG tablet Commonly known as:  NORCO Take 1-2 tablets by mouth every 6 (six) hours as needed.   midodrine 10 MG tablet Commonly known as:  PROAMATINE Take 1 tablet (10 mg total) by mouth 3 (three) times daily with meals.   MIRCERA 50 MCG/0.3ML Sosy Generic drug:  Methoxy PEG-Epoetin Beta  Inject 225 mcg as directed every 14 (fourteen) days.   multivitamin Tabs tablet Take 1 tablet by mouth at bedtime.   sevelamer carbonate 800 MG tablet Commonly known as:  RENVELA Take 2,400-4,800 mg by mouth See admin instructions. Take 6 tablets (4800 mg) by mouth 3 times daily with meals and 3 tablets (2400 mg) 2 times daily with snacks   traMADol 50 MG tablet Commonly known as:  ULTRAM Take 1 tablet (50 mg total) by mouth every 6 (six)  hours as needed for severe pain.       Followup:  Follow-up Information    Othman Masur,LES, MD On 04/19/2016.   Specialty:  Urology Why:  at 10:30 Contact information: [address] [place] [id number] [phone]

## 2016-04-14 DIAGNOSIS — I2510 Atherosclerotic heart disease of native coronary artery without angina pectoris: Secondary | ICD-10-CM | POA: Diagnosis not present

## 2016-04-14 DIAGNOSIS — Z7982 Long term (current) use of aspirin: Secondary | ICD-10-CM | POA: Diagnosis not present

## 2016-04-14 DIAGNOSIS — Z992 Dependence on renal dialysis: Secondary | ICD-10-CM | POA: Diagnosis not present

## 2016-04-14 DIAGNOSIS — Z8546 Personal history of malignant neoplasm of prostate: Secondary | ICD-10-CM | POA: Diagnosis not present

## 2016-04-14 DIAGNOSIS — Z9181 History of falling: Secondary | ICD-10-CM | POA: Diagnosis not present

## 2016-04-14 DIAGNOSIS — Z483 Aftercare following surgery for neoplasm: Secondary | ICD-10-CM | POA: Diagnosis not present

## 2016-04-14 DIAGNOSIS — E1122 Type 2 diabetes mellitus with diabetic chronic kidney disease: Secondary | ICD-10-CM | POA: Diagnosis not present

## 2016-04-14 DIAGNOSIS — C641 Malignant neoplasm of right kidney, except renal pelvis: Secondary | ICD-10-CM | POA: Diagnosis not present

## 2016-04-14 DIAGNOSIS — K219 Gastro-esophageal reflux disease without esophagitis: Secondary | ICD-10-CM | POA: Diagnosis not present

## 2016-04-14 DIAGNOSIS — D649 Anemia, unspecified: Secondary | ICD-10-CM | POA: Diagnosis not present

## 2016-04-14 DIAGNOSIS — H548 Legal blindness, as defined in USA: Secondary | ICD-10-CM | POA: Diagnosis not present

## 2016-04-14 DIAGNOSIS — N186 End stage renal disease: Secondary | ICD-10-CM | POA: Diagnosis not present

## 2016-04-14 DIAGNOSIS — Z951 Presence of aortocoronary bypass graft: Secondary | ICD-10-CM | POA: Diagnosis not present

## 2016-04-15 DIAGNOSIS — E1122 Type 2 diabetes mellitus with diabetic chronic kidney disease: Secondary | ICD-10-CM | POA: Diagnosis not present

## 2016-04-15 DIAGNOSIS — C641 Malignant neoplasm of right kidney, except renal pelvis: Secondary | ICD-10-CM | POA: Diagnosis not present

## 2016-04-15 DIAGNOSIS — H548 Legal blindness, as defined in USA: Secondary | ICD-10-CM | POA: Diagnosis not present

## 2016-04-15 DIAGNOSIS — N186 End stage renal disease: Secondary | ICD-10-CM | POA: Diagnosis not present

## 2016-04-15 DIAGNOSIS — K7689 Other specified diseases of liver: Secondary | ICD-10-CM | POA: Diagnosis not present

## 2016-04-15 DIAGNOSIS — D509 Iron deficiency anemia, unspecified: Secondary | ICD-10-CM | POA: Diagnosis not present

## 2016-04-15 DIAGNOSIS — N2581 Secondary hyperparathyroidism of renal origin: Secondary | ICD-10-CM | POA: Diagnosis not present

## 2016-04-15 DIAGNOSIS — D631 Anemia in chronic kidney disease: Secondary | ICD-10-CM | POA: Diagnosis not present

## 2016-04-15 DIAGNOSIS — I2510 Atherosclerotic heart disease of native coronary artery without angina pectoris: Secondary | ICD-10-CM | POA: Diagnosis not present

## 2016-04-15 DIAGNOSIS — E119 Type 2 diabetes mellitus without complications: Secondary | ICD-10-CM | POA: Diagnosis not present

## 2016-04-15 DIAGNOSIS — Z483 Aftercare following surgery for neoplasm: Secondary | ICD-10-CM | POA: Diagnosis not present

## 2016-04-18 DIAGNOSIS — E119 Type 2 diabetes mellitus without complications: Secondary | ICD-10-CM | POA: Diagnosis not present

## 2016-04-18 DIAGNOSIS — K7689 Other specified diseases of liver: Secondary | ICD-10-CM | POA: Diagnosis not present

## 2016-04-18 DIAGNOSIS — C641 Malignant neoplasm of right kidney, except renal pelvis: Secondary | ICD-10-CM | POA: Diagnosis not present

## 2016-04-18 DIAGNOSIS — I2510 Atherosclerotic heart disease of native coronary artery without angina pectoris: Secondary | ICD-10-CM | POA: Diagnosis not present

## 2016-04-18 DIAGNOSIS — N2581 Secondary hyperparathyroidism of renal origin: Secondary | ICD-10-CM | POA: Diagnosis not present

## 2016-04-18 DIAGNOSIS — D631 Anemia in chronic kidney disease: Secondary | ICD-10-CM | POA: Diagnosis not present

## 2016-04-18 DIAGNOSIS — Z483 Aftercare following surgery for neoplasm: Secondary | ICD-10-CM | POA: Diagnosis not present

## 2016-04-18 DIAGNOSIS — E1122 Type 2 diabetes mellitus with diabetic chronic kidney disease: Secondary | ICD-10-CM | POA: Diagnosis not present

## 2016-04-18 DIAGNOSIS — D509 Iron deficiency anemia, unspecified: Secondary | ICD-10-CM | POA: Diagnosis not present

## 2016-04-18 DIAGNOSIS — H548 Legal blindness, as defined in USA: Secondary | ICD-10-CM | POA: Diagnosis not present

## 2016-04-18 DIAGNOSIS — N186 End stage renal disease: Secondary | ICD-10-CM | POA: Diagnosis not present

## 2016-04-19 DIAGNOSIS — H548 Legal blindness, as defined in USA: Secondary | ICD-10-CM | POA: Diagnosis not present

## 2016-04-19 DIAGNOSIS — C649 Malignant neoplasm of unspecified kidney, except renal pelvis: Secondary | ICD-10-CM | POA: Diagnosis not present

## 2016-04-19 DIAGNOSIS — I2510 Atherosclerotic heart disease of native coronary artery without angina pectoris: Secondary | ICD-10-CM | POA: Diagnosis not present

## 2016-04-19 DIAGNOSIS — C641 Malignant neoplasm of right kidney, except renal pelvis: Secondary | ICD-10-CM | POA: Diagnosis not present

## 2016-04-19 DIAGNOSIS — N186 End stage renal disease: Secondary | ICD-10-CM | POA: Diagnosis not present

## 2016-04-19 DIAGNOSIS — Z483 Aftercare following surgery for neoplasm: Secondary | ICD-10-CM | POA: Diagnosis not present

## 2016-04-19 DIAGNOSIS — E1122 Type 2 diabetes mellitus with diabetic chronic kidney disease: Secondary | ICD-10-CM | POA: Diagnosis not present

## 2016-04-20 DIAGNOSIS — N2581 Secondary hyperparathyroidism of renal origin: Secondary | ICD-10-CM | POA: Diagnosis not present

## 2016-04-20 DIAGNOSIS — D631 Anemia in chronic kidney disease: Secondary | ICD-10-CM | POA: Diagnosis not present

## 2016-04-20 DIAGNOSIS — N186 End stage renal disease: Secondary | ICD-10-CM | POA: Diagnosis not present

## 2016-04-20 DIAGNOSIS — K7689 Other specified diseases of liver: Secondary | ICD-10-CM | POA: Diagnosis not present

## 2016-04-20 DIAGNOSIS — D509 Iron deficiency anemia, unspecified: Secondary | ICD-10-CM | POA: Diagnosis not present

## 2016-04-20 DIAGNOSIS — E119 Type 2 diabetes mellitus without complications: Secondary | ICD-10-CM | POA: Diagnosis not present

## 2016-04-21 DIAGNOSIS — H548 Legal blindness, as defined in USA: Secondary | ICD-10-CM | POA: Diagnosis not present

## 2016-04-21 DIAGNOSIS — C641 Malignant neoplasm of right kidney, except renal pelvis: Secondary | ICD-10-CM | POA: Diagnosis not present

## 2016-04-21 DIAGNOSIS — E1122 Type 2 diabetes mellitus with diabetic chronic kidney disease: Secondary | ICD-10-CM | POA: Diagnosis not present

## 2016-04-21 DIAGNOSIS — I2510 Atherosclerotic heart disease of native coronary artery without angina pectoris: Secondary | ICD-10-CM | POA: Diagnosis not present

## 2016-04-21 DIAGNOSIS — Z483 Aftercare following surgery for neoplasm: Secondary | ICD-10-CM | POA: Diagnosis not present

## 2016-04-21 DIAGNOSIS — N186 End stage renal disease: Secondary | ICD-10-CM | POA: Diagnosis not present

## 2016-04-22 DIAGNOSIS — K7689 Other specified diseases of liver: Secondary | ICD-10-CM | POA: Diagnosis not present

## 2016-04-22 DIAGNOSIS — N186 End stage renal disease: Secondary | ICD-10-CM | POA: Diagnosis not present

## 2016-04-22 DIAGNOSIS — N2581 Secondary hyperparathyroidism of renal origin: Secondary | ICD-10-CM | POA: Diagnosis not present

## 2016-04-22 DIAGNOSIS — I2510 Atherosclerotic heart disease of native coronary artery without angina pectoris: Secondary | ICD-10-CM | POA: Diagnosis not present

## 2016-04-22 DIAGNOSIS — E1129 Type 2 diabetes mellitus with other diabetic kidney complication: Secondary | ICD-10-CM | POA: Diagnosis not present

## 2016-04-22 DIAGNOSIS — E119 Type 2 diabetes mellitus without complications: Secondary | ICD-10-CM | POA: Diagnosis not present

## 2016-04-22 DIAGNOSIS — D509 Iron deficiency anemia, unspecified: Secondary | ICD-10-CM | POA: Diagnosis not present

## 2016-04-22 DIAGNOSIS — D631 Anemia in chronic kidney disease: Secondary | ICD-10-CM | POA: Diagnosis not present

## 2016-04-22 DIAGNOSIS — Z483 Aftercare following surgery for neoplasm: Secondary | ICD-10-CM | POA: Diagnosis not present

## 2016-04-22 DIAGNOSIS — E1122 Type 2 diabetes mellitus with diabetic chronic kidney disease: Secondary | ICD-10-CM | POA: Diagnosis not present

## 2016-04-22 DIAGNOSIS — C641 Malignant neoplasm of right kidney, except renal pelvis: Secondary | ICD-10-CM | POA: Diagnosis not present

## 2016-04-22 DIAGNOSIS — H548 Legal blindness, as defined in USA: Secondary | ICD-10-CM | POA: Diagnosis not present

## 2016-04-25 DIAGNOSIS — D509 Iron deficiency anemia, unspecified: Secondary | ICD-10-CM | POA: Diagnosis not present

## 2016-04-25 DIAGNOSIS — E119 Type 2 diabetes mellitus without complications: Secondary | ICD-10-CM | POA: Diagnosis not present

## 2016-04-25 DIAGNOSIS — N2581 Secondary hyperparathyroidism of renal origin: Secondary | ICD-10-CM | POA: Diagnosis not present

## 2016-04-25 DIAGNOSIS — K7689 Other specified diseases of liver: Secondary | ICD-10-CM | POA: Diagnosis not present

## 2016-04-25 DIAGNOSIS — D631 Anemia in chronic kidney disease: Secondary | ICD-10-CM | POA: Diagnosis not present

## 2016-04-25 DIAGNOSIS — N186 End stage renal disease: Secondary | ICD-10-CM | POA: Diagnosis not present

## 2016-04-25 NOTE — Addendum Note (Signed)
Addended by: Waylan Rocher on: 04/25/2016 12:39 PM   Modules accepted: Orders

## 2016-04-26 DIAGNOSIS — E1122 Type 2 diabetes mellitus with diabetic chronic kidney disease: Secondary | ICD-10-CM | POA: Diagnosis not present

## 2016-04-26 DIAGNOSIS — H548 Legal blindness, as defined in USA: Secondary | ICD-10-CM | POA: Diagnosis not present

## 2016-04-26 DIAGNOSIS — N186 End stage renal disease: Secondary | ICD-10-CM | POA: Diagnosis not present

## 2016-04-26 DIAGNOSIS — C641 Malignant neoplasm of right kidney, except renal pelvis: Secondary | ICD-10-CM | POA: Diagnosis not present

## 2016-04-26 DIAGNOSIS — I2510 Atherosclerotic heart disease of native coronary artery without angina pectoris: Secondary | ICD-10-CM | POA: Diagnosis not present

## 2016-04-26 DIAGNOSIS — Z483 Aftercare following surgery for neoplasm: Secondary | ICD-10-CM | POA: Diagnosis not present

## 2016-04-27 DIAGNOSIS — E119 Type 2 diabetes mellitus without complications: Secondary | ICD-10-CM | POA: Diagnosis not present

## 2016-04-27 DIAGNOSIS — N186 End stage renal disease: Secondary | ICD-10-CM | POA: Diagnosis not present

## 2016-04-27 DIAGNOSIS — K7689 Other specified diseases of liver: Secondary | ICD-10-CM | POA: Diagnosis not present

## 2016-04-27 DIAGNOSIS — N2581 Secondary hyperparathyroidism of renal origin: Secondary | ICD-10-CM | POA: Diagnosis not present

## 2016-04-27 DIAGNOSIS — D509 Iron deficiency anemia, unspecified: Secondary | ICD-10-CM | POA: Diagnosis not present

## 2016-04-27 DIAGNOSIS — D631 Anemia in chronic kidney disease: Secondary | ICD-10-CM | POA: Diagnosis not present

## 2016-04-28 DIAGNOSIS — C641 Malignant neoplasm of right kidney, except renal pelvis: Secondary | ICD-10-CM | POA: Diagnosis not present

## 2016-04-28 DIAGNOSIS — N186 End stage renal disease: Secondary | ICD-10-CM | POA: Diagnosis not present

## 2016-04-28 DIAGNOSIS — H548 Legal blindness, as defined in USA: Secondary | ICD-10-CM | POA: Diagnosis not present

## 2016-04-28 DIAGNOSIS — E1122 Type 2 diabetes mellitus with diabetic chronic kidney disease: Secondary | ICD-10-CM | POA: Diagnosis not present

## 2016-04-28 DIAGNOSIS — N390 Urinary tract infection, site not specified: Secondary | ICD-10-CM | POA: Diagnosis not present

## 2016-04-28 DIAGNOSIS — I2510 Atherosclerotic heart disease of native coronary artery without angina pectoris: Secondary | ICD-10-CM | POA: Diagnosis not present

## 2016-04-28 DIAGNOSIS — Z483 Aftercare following surgery for neoplasm: Secondary | ICD-10-CM | POA: Diagnosis not present

## 2016-04-29 DIAGNOSIS — N186 End stage renal disease: Secondary | ICD-10-CM | POA: Diagnosis not present

## 2016-04-29 DIAGNOSIS — D509 Iron deficiency anemia, unspecified: Secondary | ICD-10-CM | POA: Diagnosis not present

## 2016-04-29 DIAGNOSIS — E1122 Type 2 diabetes mellitus with diabetic chronic kidney disease: Secondary | ICD-10-CM | POA: Diagnosis not present

## 2016-04-29 DIAGNOSIS — K7689 Other specified diseases of liver: Secondary | ICD-10-CM | POA: Diagnosis not present

## 2016-04-29 DIAGNOSIS — N2581 Secondary hyperparathyroidism of renal origin: Secondary | ICD-10-CM | POA: Diagnosis not present

## 2016-04-29 DIAGNOSIS — Z483 Aftercare following surgery for neoplasm: Secondary | ICD-10-CM | POA: Diagnosis not present

## 2016-04-29 DIAGNOSIS — D631 Anemia in chronic kidney disease: Secondary | ICD-10-CM | POA: Diagnosis not present

## 2016-04-29 DIAGNOSIS — E119 Type 2 diabetes mellitus without complications: Secondary | ICD-10-CM | POA: Diagnosis not present

## 2016-04-29 DIAGNOSIS — H548 Legal blindness, as defined in USA: Secondary | ICD-10-CM | POA: Diagnosis not present

## 2016-04-29 DIAGNOSIS — I2510 Atherosclerotic heart disease of native coronary artery without angina pectoris: Secondary | ICD-10-CM | POA: Diagnosis not present

## 2016-04-29 DIAGNOSIS — C641 Malignant neoplasm of right kidney, except renal pelvis: Secondary | ICD-10-CM | POA: Diagnosis not present

## 2016-05-02 DIAGNOSIS — E119 Type 2 diabetes mellitus without complications: Secondary | ICD-10-CM | POA: Diagnosis not present

## 2016-05-02 DIAGNOSIS — D631 Anemia in chronic kidney disease: Secondary | ICD-10-CM | POA: Diagnosis not present

## 2016-05-02 DIAGNOSIS — K7689 Other specified diseases of liver: Secondary | ICD-10-CM | POA: Diagnosis not present

## 2016-05-02 DIAGNOSIS — N2581 Secondary hyperparathyroidism of renal origin: Secondary | ICD-10-CM | POA: Diagnosis not present

## 2016-05-02 DIAGNOSIS — D509 Iron deficiency anemia, unspecified: Secondary | ICD-10-CM | POA: Diagnosis not present

## 2016-05-02 DIAGNOSIS — N186 End stage renal disease: Secondary | ICD-10-CM | POA: Diagnosis not present

## 2016-05-03 DIAGNOSIS — N186 End stage renal disease: Secondary | ICD-10-CM | POA: Diagnosis not present

## 2016-05-03 DIAGNOSIS — C641 Malignant neoplasm of right kidney, except renal pelvis: Secondary | ICD-10-CM | POA: Diagnosis not present

## 2016-05-03 DIAGNOSIS — E1122 Type 2 diabetes mellitus with diabetic chronic kidney disease: Secondary | ICD-10-CM | POA: Diagnosis not present

## 2016-05-03 DIAGNOSIS — H548 Legal blindness, as defined in USA: Secondary | ICD-10-CM | POA: Diagnosis not present

## 2016-05-03 DIAGNOSIS — I2510 Atherosclerotic heart disease of native coronary artery without angina pectoris: Secondary | ICD-10-CM | POA: Diagnosis not present

## 2016-05-03 DIAGNOSIS — Z483 Aftercare following surgery for neoplasm: Secondary | ICD-10-CM | POA: Diagnosis not present

## 2016-05-04 DIAGNOSIS — R300 Dysuria: Secondary | ICD-10-CM | POA: Diagnosis not present

## 2016-05-04 DIAGNOSIS — N481 Balanitis: Secondary | ICD-10-CM | POA: Diagnosis not present

## 2016-05-04 DIAGNOSIS — E1129 Type 2 diabetes mellitus with other diabetic kidney complication: Secondary | ICD-10-CM | POA: Diagnosis not present

## 2016-05-04 DIAGNOSIS — K7689 Other specified diseases of liver: Secondary | ICD-10-CM | POA: Diagnosis not present

## 2016-05-04 DIAGNOSIS — D509 Iron deficiency anemia, unspecified: Secondary | ICD-10-CM | POA: Diagnosis not present

## 2016-05-04 DIAGNOSIS — Z992 Dependence on renal dialysis: Secondary | ICD-10-CM | POA: Diagnosis not present

## 2016-05-04 DIAGNOSIS — N2581 Secondary hyperparathyroidism of renal origin: Secondary | ICD-10-CM | POA: Diagnosis not present

## 2016-05-04 DIAGNOSIS — N186 End stage renal disease: Secondary | ICD-10-CM | POA: Diagnosis not present

## 2016-05-04 DIAGNOSIS — E119 Type 2 diabetes mellitus without complications: Secondary | ICD-10-CM | POA: Diagnosis not present

## 2016-05-04 DIAGNOSIS — D631 Anemia in chronic kidney disease: Secondary | ICD-10-CM | POA: Diagnosis not present

## 2016-05-05 DIAGNOSIS — E1122 Type 2 diabetes mellitus with diabetic chronic kidney disease: Secondary | ICD-10-CM | POA: Diagnosis not present

## 2016-05-05 DIAGNOSIS — I2510 Atherosclerotic heart disease of native coronary artery without angina pectoris: Secondary | ICD-10-CM | POA: Diagnosis not present

## 2016-05-05 DIAGNOSIS — C641 Malignant neoplasm of right kidney, except renal pelvis: Secondary | ICD-10-CM | POA: Diagnosis not present

## 2016-05-05 DIAGNOSIS — Z483 Aftercare following surgery for neoplasm: Secondary | ICD-10-CM | POA: Diagnosis not present

## 2016-05-05 DIAGNOSIS — H548 Legal blindness, as defined in USA: Secondary | ICD-10-CM | POA: Diagnosis not present

## 2016-05-05 DIAGNOSIS — N186 End stage renal disease: Secondary | ICD-10-CM | POA: Diagnosis not present

## 2016-05-06 DIAGNOSIS — N2581 Secondary hyperparathyroidism of renal origin: Secondary | ICD-10-CM | POA: Diagnosis not present

## 2016-05-06 DIAGNOSIS — E119 Type 2 diabetes mellitus without complications: Secondary | ICD-10-CM | POA: Diagnosis not present

## 2016-05-06 DIAGNOSIS — D509 Iron deficiency anemia, unspecified: Secondary | ICD-10-CM | POA: Diagnosis not present

## 2016-05-06 DIAGNOSIS — Z23 Encounter for immunization: Secondary | ICD-10-CM | POA: Diagnosis not present

## 2016-05-06 DIAGNOSIS — N186 End stage renal disease: Secondary | ICD-10-CM | POA: Diagnosis not present

## 2016-05-06 DIAGNOSIS — K7689 Other specified diseases of liver: Secondary | ICD-10-CM | POA: Diagnosis not present

## 2016-05-06 DIAGNOSIS — D631 Anemia in chronic kidney disease: Secondary | ICD-10-CM | POA: Diagnosis not present

## 2016-05-09 DIAGNOSIS — D509 Iron deficiency anemia, unspecified: Secondary | ICD-10-CM | POA: Diagnosis not present

## 2016-05-09 DIAGNOSIS — K7689 Other specified diseases of liver: Secondary | ICD-10-CM | POA: Diagnosis not present

## 2016-05-09 DIAGNOSIS — N2581 Secondary hyperparathyroidism of renal origin: Secondary | ICD-10-CM | POA: Diagnosis not present

## 2016-05-09 DIAGNOSIS — E119 Type 2 diabetes mellitus without complications: Secondary | ICD-10-CM | POA: Diagnosis not present

## 2016-05-09 DIAGNOSIS — D631 Anemia in chronic kidney disease: Secondary | ICD-10-CM | POA: Diagnosis not present

## 2016-05-09 DIAGNOSIS — N186 End stage renal disease: Secondary | ICD-10-CM | POA: Diagnosis not present

## 2016-05-10 DIAGNOSIS — H548 Legal blindness, as defined in USA: Secondary | ICD-10-CM | POA: Diagnosis not present

## 2016-05-10 DIAGNOSIS — N186 End stage renal disease: Secondary | ICD-10-CM | POA: Diagnosis not present

## 2016-05-10 DIAGNOSIS — C641 Malignant neoplasm of right kidney, except renal pelvis: Secondary | ICD-10-CM | POA: Diagnosis not present

## 2016-05-10 DIAGNOSIS — E1122 Type 2 diabetes mellitus with diabetic chronic kidney disease: Secondary | ICD-10-CM | POA: Diagnosis not present

## 2016-05-10 DIAGNOSIS — I2510 Atherosclerotic heart disease of native coronary artery without angina pectoris: Secondary | ICD-10-CM | POA: Diagnosis not present

## 2016-05-10 DIAGNOSIS — Z483 Aftercare following surgery for neoplasm: Secondary | ICD-10-CM | POA: Diagnosis not present

## 2016-05-11 DIAGNOSIS — K7689 Other specified diseases of liver: Secondary | ICD-10-CM | POA: Diagnosis not present

## 2016-05-11 DIAGNOSIS — N186 End stage renal disease: Secondary | ICD-10-CM | POA: Diagnosis not present

## 2016-05-11 DIAGNOSIS — D631 Anemia in chronic kidney disease: Secondary | ICD-10-CM | POA: Diagnosis not present

## 2016-05-11 DIAGNOSIS — E119 Type 2 diabetes mellitus without complications: Secondary | ICD-10-CM | POA: Diagnosis not present

## 2016-05-11 DIAGNOSIS — D509 Iron deficiency anemia, unspecified: Secondary | ICD-10-CM | POA: Diagnosis not present

## 2016-05-11 DIAGNOSIS — N2581 Secondary hyperparathyroidism of renal origin: Secondary | ICD-10-CM | POA: Diagnosis not present

## 2016-05-12 DIAGNOSIS — C641 Malignant neoplasm of right kidney, except renal pelvis: Secondary | ICD-10-CM | POA: Diagnosis not present

## 2016-05-12 DIAGNOSIS — Z992 Dependence on renal dialysis: Secondary | ICD-10-CM | POA: Diagnosis not present

## 2016-05-12 DIAGNOSIS — A0472 Enterocolitis due to Clostridium difficile, not specified as recurrent: Secondary | ICD-10-CM | POA: Diagnosis not present

## 2016-05-12 DIAGNOSIS — Z483 Aftercare following surgery for neoplasm: Secondary | ICD-10-CM | POA: Diagnosis not present

## 2016-05-12 DIAGNOSIS — C7900 Secondary malignant neoplasm of unspecified kidney and renal pelvis: Secondary | ICD-10-CM | POA: Diagnosis not present

## 2016-05-12 DIAGNOSIS — E1122 Type 2 diabetes mellitus with diabetic chronic kidney disease: Secondary | ICD-10-CM | POA: Diagnosis not present

## 2016-05-12 DIAGNOSIS — Z9049 Acquired absence of other specified parts of digestive tract: Secondary | ICD-10-CM | POA: Diagnosis not present

## 2016-05-12 DIAGNOSIS — Z6825 Body mass index (BMI) 25.0-25.9, adult: Secondary | ICD-10-CM | POA: Diagnosis not present

## 2016-05-12 DIAGNOSIS — I2510 Atherosclerotic heart disease of native coronary artery without angina pectoris: Secondary | ICD-10-CM | POA: Diagnosis not present

## 2016-05-12 DIAGNOSIS — H548 Legal blindness, as defined in USA: Secondary | ICD-10-CM | POA: Diagnosis not present

## 2016-05-12 DIAGNOSIS — Z452 Encounter for adjustment and management of vascular access device: Secondary | ICD-10-CM | POA: Diagnosis not present

## 2016-05-12 DIAGNOSIS — N186 End stage renal disease: Secondary | ICD-10-CM | POA: Diagnosis not present

## 2016-05-13 DIAGNOSIS — K7689 Other specified diseases of liver: Secondary | ICD-10-CM | POA: Diagnosis not present

## 2016-05-13 DIAGNOSIS — D509 Iron deficiency anemia, unspecified: Secondary | ICD-10-CM | POA: Diagnosis not present

## 2016-05-13 DIAGNOSIS — N186 End stage renal disease: Secondary | ICD-10-CM | POA: Diagnosis not present

## 2016-05-13 DIAGNOSIS — N2581 Secondary hyperparathyroidism of renal origin: Secondary | ICD-10-CM | POA: Diagnosis not present

## 2016-05-13 DIAGNOSIS — E119 Type 2 diabetes mellitus without complications: Secondary | ICD-10-CM | POA: Diagnosis not present

## 2016-05-13 DIAGNOSIS — D631 Anemia in chronic kidney disease: Secondary | ICD-10-CM | POA: Diagnosis not present

## 2016-05-16 DIAGNOSIS — K7689 Other specified diseases of liver: Secondary | ICD-10-CM | POA: Diagnosis not present

## 2016-05-16 DIAGNOSIS — D509 Iron deficiency anemia, unspecified: Secondary | ICD-10-CM | POA: Diagnosis not present

## 2016-05-16 DIAGNOSIS — D631 Anemia in chronic kidney disease: Secondary | ICD-10-CM | POA: Diagnosis not present

## 2016-05-16 DIAGNOSIS — N2581 Secondary hyperparathyroidism of renal origin: Secondary | ICD-10-CM | POA: Diagnosis not present

## 2016-05-16 DIAGNOSIS — E119 Type 2 diabetes mellitus without complications: Secondary | ICD-10-CM | POA: Diagnosis not present

## 2016-05-16 DIAGNOSIS — N186 End stage renal disease: Secondary | ICD-10-CM | POA: Diagnosis not present

## 2016-05-17 DIAGNOSIS — C641 Malignant neoplasm of right kidney, except renal pelvis: Secondary | ICD-10-CM | POA: Diagnosis not present

## 2016-05-17 DIAGNOSIS — I2510 Atherosclerotic heart disease of native coronary artery without angina pectoris: Secondary | ICD-10-CM | POA: Diagnosis not present

## 2016-05-17 DIAGNOSIS — N186 End stage renal disease: Secondary | ICD-10-CM | POA: Diagnosis not present

## 2016-05-17 DIAGNOSIS — H548 Legal blindness, as defined in USA: Secondary | ICD-10-CM | POA: Diagnosis not present

## 2016-05-17 DIAGNOSIS — Z483 Aftercare following surgery for neoplasm: Secondary | ICD-10-CM | POA: Diagnosis not present

## 2016-05-17 DIAGNOSIS — E1122 Type 2 diabetes mellitus with diabetic chronic kidney disease: Secondary | ICD-10-CM | POA: Diagnosis not present

## 2016-05-18 DIAGNOSIS — D631 Anemia in chronic kidney disease: Secondary | ICD-10-CM | POA: Diagnosis not present

## 2016-05-18 DIAGNOSIS — K7689 Other specified diseases of liver: Secondary | ICD-10-CM | POA: Diagnosis not present

## 2016-05-18 DIAGNOSIS — D509 Iron deficiency anemia, unspecified: Secondary | ICD-10-CM | POA: Diagnosis not present

## 2016-05-18 DIAGNOSIS — E119 Type 2 diabetes mellitus without complications: Secondary | ICD-10-CM | POA: Diagnosis not present

## 2016-05-18 DIAGNOSIS — N2581 Secondary hyperparathyroidism of renal origin: Secondary | ICD-10-CM | POA: Diagnosis not present

## 2016-05-18 DIAGNOSIS — N186 End stage renal disease: Secondary | ICD-10-CM | POA: Diagnosis not present

## 2016-05-19 ENCOUNTER — Ambulatory Visit: Payer: Medicare Other | Admitting: Cardiology

## 2016-05-20 DIAGNOSIS — D631 Anemia in chronic kidney disease: Secondary | ICD-10-CM | POA: Diagnosis not present

## 2016-05-20 DIAGNOSIS — D509 Iron deficiency anemia, unspecified: Secondary | ICD-10-CM | POA: Diagnosis not present

## 2016-05-20 DIAGNOSIS — N186 End stage renal disease: Secondary | ICD-10-CM | POA: Diagnosis not present

## 2016-05-20 DIAGNOSIS — E119 Type 2 diabetes mellitus without complications: Secondary | ICD-10-CM | POA: Diagnosis not present

## 2016-05-20 DIAGNOSIS — K7689 Other specified diseases of liver: Secondary | ICD-10-CM | POA: Diagnosis not present

## 2016-05-20 DIAGNOSIS — N2581 Secondary hyperparathyroidism of renal origin: Secondary | ICD-10-CM | POA: Diagnosis not present

## 2016-05-23 DIAGNOSIS — N2581 Secondary hyperparathyroidism of renal origin: Secondary | ICD-10-CM | POA: Diagnosis not present

## 2016-05-23 DIAGNOSIS — N186 End stage renal disease: Secondary | ICD-10-CM | POA: Diagnosis not present

## 2016-05-23 DIAGNOSIS — D631 Anemia in chronic kidney disease: Secondary | ICD-10-CM | POA: Diagnosis not present

## 2016-05-23 DIAGNOSIS — K7689 Other specified diseases of liver: Secondary | ICD-10-CM | POA: Diagnosis not present

## 2016-05-23 DIAGNOSIS — D509 Iron deficiency anemia, unspecified: Secondary | ICD-10-CM | POA: Diagnosis not present

## 2016-05-23 DIAGNOSIS — E119 Type 2 diabetes mellitus without complications: Secondary | ICD-10-CM | POA: Diagnosis not present

## 2016-05-24 DIAGNOSIS — C641 Malignant neoplasm of right kidney, except renal pelvis: Secondary | ICD-10-CM | POA: Diagnosis not present

## 2016-05-24 DIAGNOSIS — H548 Legal blindness, as defined in USA: Secondary | ICD-10-CM | POA: Diagnosis not present

## 2016-05-24 DIAGNOSIS — N186 End stage renal disease: Secondary | ICD-10-CM | POA: Diagnosis not present

## 2016-05-24 DIAGNOSIS — E1122 Type 2 diabetes mellitus with diabetic chronic kidney disease: Secondary | ICD-10-CM | POA: Diagnosis not present

## 2016-05-24 DIAGNOSIS — I2510 Atherosclerotic heart disease of native coronary artery without angina pectoris: Secondary | ICD-10-CM | POA: Diagnosis not present

## 2016-05-24 DIAGNOSIS — Z483 Aftercare following surgery for neoplasm: Secondary | ICD-10-CM | POA: Diagnosis not present

## 2016-05-25 DIAGNOSIS — K7689 Other specified diseases of liver: Secondary | ICD-10-CM | POA: Diagnosis not present

## 2016-05-25 DIAGNOSIS — E119 Type 2 diabetes mellitus without complications: Secondary | ICD-10-CM | POA: Diagnosis not present

## 2016-05-25 DIAGNOSIS — N2581 Secondary hyperparathyroidism of renal origin: Secondary | ICD-10-CM | POA: Diagnosis not present

## 2016-05-25 DIAGNOSIS — N186 End stage renal disease: Secondary | ICD-10-CM | POA: Diagnosis not present

## 2016-05-25 DIAGNOSIS — D509 Iron deficiency anemia, unspecified: Secondary | ICD-10-CM | POA: Diagnosis not present

## 2016-05-25 DIAGNOSIS — D631 Anemia in chronic kidney disease: Secondary | ICD-10-CM | POA: Diagnosis not present

## 2016-05-26 DIAGNOSIS — Z483 Aftercare following surgery for neoplasm: Secondary | ICD-10-CM | POA: Diagnosis not present

## 2016-05-26 DIAGNOSIS — I2510 Atherosclerotic heart disease of native coronary artery without angina pectoris: Secondary | ICD-10-CM | POA: Diagnosis not present

## 2016-05-26 DIAGNOSIS — H548 Legal blindness, as defined in USA: Secondary | ICD-10-CM | POA: Diagnosis not present

## 2016-05-26 DIAGNOSIS — C641 Malignant neoplasm of right kidney, except renal pelvis: Secondary | ICD-10-CM | POA: Diagnosis not present

## 2016-05-26 DIAGNOSIS — N186 End stage renal disease: Secondary | ICD-10-CM | POA: Diagnosis not present

## 2016-05-26 DIAGNOSIS — E1122 Type 2 diabetes mellitus with diabetic chronic kidney disease: Secondary | ICD-10-CM | POA: Diagnosis not present

## 2016-05-27 DIAGNOSIS — D509 Iron deficiency anemia, unspecified: Secondary | ICD-10-CM | POA: Diagnosis not present

## 2016-05-27 DIAGNOSIS — K7689 Other specified diseases of liver: Secondary | ICD-10-CM | POA: Diagnosis not present

## 2016-05-27 DIAGNOSIS — N186 End stage renal disease: Secondary | ICD-10-CM | POA: Diagnosis not present

## 2016-05-27 DIAGNOSIS — D631 Anemia in chronic kidney disease: Secondary | ICD-10-CM | POA: Diagnosis not present

## 2016-05-27 DIAGNOSIS — N2581 Secondary hyperparathyroidism of renal origin: Secondary | ICD-10-CM | POA: Diagnosis not present

## 2016-05-27 DIAGNOSIS — E119 Type 2 diabetes mellitus without complications: Secondary | ICD-10-CM | POA: Diagnosis not present

## 2016-05-30 DIAGNOSIS — N2581 Secondary hyperparathyroidism of renal origin: Secondary | ICD-10-CM | POA: Diagnosis not present

## 2016-05-30 DIAGNOSIS — K7689 Other specified diseases of liver: Secondary | ICD-10-CM | POA: Diagnosis not present

## 2016-05-30 DIAGNOSIS — N186 End stage renal disease: Secondary | ICD-10-CM | POA: Diagnosis not present

## 2016-05-30 DIAGNOSIS — E119 Type 2 diabetes mellitus without complications: Secondary | ICD-10-CM | POA: Diagnosis not present

## 2016-05-30 DIAGNOSIS — D631 Anemia in chronic kidney disease: Secondary | ICD-10-CM | POA: Diagnosis not present

## 2016-05-30 DIAGNOSIS — D509 Iron deficiency anemia, unspecified: Secondary | ICD-10-CM | POA: Diagnosis not present

## 2016-05-31 DIAGNOSIS — I2510 Atherosclerotic heart disease of native coronary artery without angina pectoris: Secondary | ICD-10-CM | POA: Diagnosis not present

## 2016-05-31 DIAGNOSIS — E1122 Type 2 diabetes mellitus with diabetic chronic kidney disease: Secondary | ICD-10-CM | POA: Diagnosis not present

## 2016-05-31 DIAGNOSIS — N186 End stage renal disease: Secondary | ICD-10-CM | POA: Diagnosis not present

## 2016-05-31 DIAGNOSIS — C641 Malignant neoplasm of right kidney, except renal pelvis: Secondary | ICD-10-CM | POA: Diagnosis not present

## 2016-05-31 DIAGNOSIS — H548 Legal blindness, as defined in USA: Secondary | ICD-10-CM | POA: Diagnosis not present

## 2016-05-31 DIAGNOSIS — Z483 Aftercare following surgery for neoplasm: Secondary | ICD-10-CM | POA: Diagnosis not present

## 2016-06-01 DIAGNOSIS — D509 Iron deficiency anemia, unspecified: Secondary | ICD-10-CM | POA: Diagnosis not present

## 2016-06-01 DIAGNOSIS — D631 Anemia in chronic kidney disease: Secondary | ICD-10-CM | POA: Diagnosis not present

## 2016-06-01 DIAGNOSIS — N2581 Secondary hyperparathyroidism of renal origin: Secondary | ICD-10-CM | POA: Diagnosis not present

## 2016-06-01 DIAGNOSIS — E1129 Type 2 diabetes mellitus with other diabetic kidney complication: Secondary | ICD-10-CM | POA: Diagnosis not present

## 2016-06-01 DIAGNOSIS — Z992 Dependence on renal dialysis: Secondary | ICD-10-CM | POA: Diagnosis not present

## 2016-06-01 DIAGNOSIS — E119 Type 2 diabetes mellitus without complications: Secondary | ICD-10-CM | POA: Diagnosis not present

## 2016-06-01 DIAGNOSIS — K7689 Other specified diseases of liver: Secondary | ICD-10-CM | POA: Diagnosis not present

## 2016-06-01 DIAGNOSIS — N186 End stage renal disease: Secondary | ICD-10-CM | POA: Diagnosis not present

## 2016-06-02 ENCOUNTER — Encounter: Payer: Self-pay | Admitting: Podiatry

## 2016-06-02 ENCOUNTER — Ambulatory Visit (INDEPENDENT_AMBULATORY_CARE_PROVIDER_SITE_OTHER): Payer: Medicare Other | Admitting: Podiatry

## 2016-06-02 DIAGNOSIS — I739 Peripheral vascular disease, unspecified: Secondary | ICD-10-CM | POA: Diagnosis not present

## 2016-06-02 DIAGNOSIS — M79675 Pain in left toe(s): Secondary | ICD-10-CM

## 2016-06-02 DIAGNOSIS — B351 Tinea unguium: Secondary | ICD-10-CM | POA: Diagnosis not present

## 2016-06-02 DIAGNOSIS — E1142 Type 2 diabetes mellitus with diabetic polyneuropathy: Secondary | ICD-10-CM

## 2016-06-02 DIAGNOSIS — M79674 Pain in right toe(s): Secondary | ICD-10-CM | POA: Diagnosis not present

## 2016-06-02 DIAGNOSIS — M79676 Pain in unspecified toe(s): Secondary | ICD-10-CM

## 2016-06-02 DIAGNOSIS — L84 Corns and callosities: Secondary | ICD-10-CM

## 2016-06-02 NOTE — Progress Notes (Signed)
Subjective: 66 y.o. returns the office today for painful, elongated, thickened toenails which he cannot trim himself. Denies any redness or drainage around the nails. He has a callus to his left heel that his wife states she did peel off a piece of skin recently. Denies any swelling or drainage. Denies any acute changes since last appointment and no new complaints today. Denies any systemic complaints such as fevers, chills, nausea, vomiting.   Objective: AAO 3, NAD DP/PT pulses palpable 1/4, CRT less than 3 seconds Nails hypertrophic, dystrophic, elongated, brittle, discolored 5. There is tenderness overlying the nails 1-5 on the left. There is no surrounding erythema or drainage along the nail sites. Hyperkeratotic lesion to the left posterior medial heel. Upon debridement no underlying ulceration, drainage or any signs of infection. Transmetatarsal amputation right foot No open lesions or pre-ulcerative lesions are identified. No other areas of tenderness bilateral lower extremities. No overlying edema, erythema, increased warmth. No pain with calf compression, swelling, warmth, erythema.  Assessment: Patient presents with symptomatic onychomycosis; hyperkeratotic lesion  Plan: -Treatment options including alternatives, risks, complications were discussed -Nails sharply debrided 5 without complication/bleeding. -Hyperkeratotic lesion debrided 1 without complications or bleeding. -Discussed daily foot inspection. If there are any changes, to call the office immediately.  -Follow-up in 3 months or sooner if any problems are to arise. In the meantime, encouraged to call the office with any questions, concerns, changes symptoms.  Celesta Gentile, DPM

## 2016-06-03 DIAGNOSIS — K7689 Other specified diseases of liver: Secondary | ICD-10-CM | POA: Diagnosis not present

## 2016-06-03 DIAGNOSIS — D631 Anemia in chronic kidney disease: Secondary | ICD-10-CM | POA: Diagnosis not present

## 2016-06-03 DIAGNOSIS — N2581 Secondary hyperparathyroidism of renal origin: Secondary | ICD-10-CM | POA: Diagnosis not present

## 2016-06-03 DIAGNOSIS — N186 End stage renal disease: Secondary | ICD-10-CM | POA: Diagnosis not present

## 2016-06-03 DIAGNOSIS — E119 Type 2 diabetes mellitus without complications: Secondary | ICD-10-CM | POA: Diagnosis not present

## 2016-06-03 DIAGNOSIS — D509 Iron deficiency anemia, unspecified: Secondary | ICD-10-CM | POA: Diagnosis not present

## 2016-06-04 DIAGNOSIS — H548 Legal blindness, as defined in USA: Secondary | ICD-10-CM | POA: Diagnosis not present

## 2016-06-04 DIAGNOSIS — Z483 Aftercare following surgery for neoplasm: Secondary | ICD-10-CM | POA: Diagnosis not present

## 2016-06-04 DIAGNOSIS — N186 End stage renal disease: Secondary | ICD-10-CM | POA: Diagnosis not present

## 2016-06-04 DIAGNOSIS — I2510 Atherosclerotic heart disease of native coronary artery without angina pectoris: Secondary | ICD-10-CM | POA: Diagnosis not present

## 2016-06-04 DIAGNOSIS — E1122 Type 2 diabetes mellitus with diabetic chronic kidney disease: Secondary | ICD-10-CM | POA: Diagnosis not present

## 2016-06-04 DIAGNOSIS — C641 Malignant neoplasm of right kidney, except renal pelvis: Secondary | ICD-10-CM | POA: Diagnosis not present

## 2016-06-06 DIAGNOSIS — E119 Type 2 diabetes mellitus without complications: Secondary | ICD-10-CM | POA: Diagnosis not present

## 2016-06-06 DIAGNOSIS — N186 End stage renal disease: Secondary | ICD-10-CM | POA: Diagnosis not present

## 2016-06-06 DIAGNOSIS — K7689 Other specified diseases of liver: Secondary | ICD-10-CM | POA: Diagnosis not present

## 2016-06-06 DIAGNOSIS — N2581 Secondary hyperparathyroidism of renal origin: Secondary | ICD-10-CM | POA: Diagnosis not present

## 2016-06-06 DIAGNOSIS — D631 Anemia in chronic kidney disease: Secondary | ICD-10-CM | POA: Diagnosis not present

## 2016-06-06 DIAGNOSIS — D509 Iron deficiency anemia, unspecified: Secondary | ICD-10-CM | POA: Diagnosis not present

## 2016-06-07 DIAGNOSIS — E1129 Type 2 diabetes mellitus with other diabetic kidney complication: Secondary | ICD-10-CM | POA: Diagnosis not present

## 2016-06-07 DIAGNOSIS — E784 Other hyperlipidemia: Secondary | ICD-10-CM | POA: Diagnosis not present

## 2016-06-07 DIAGNOSIS — E1151 Type 2 diabetes mellitus with diabetic peripheral angiopathy without gangrene: Secondary | ICD-10-CM | POA: Diagnosis not present

## 2016-06-07 DIAGNOSIS — E1122 Type 2 diabetes mellitus with diabetic chronic kidney disease: Secondary | ICD-10-CM | POA: Diagnosis not present

## 2016-06-07 DIAGNOSIS — N186 End stage renal disease: Secondary | ICD-10-CM | POA: Diagnosis not present

## 2016-06-07 DIAGNOSIS — Z6824 Body mass index (BMI) 24.0-24.9, adult: Secondary | ICD-10-CM | POA: Diagnosis not present

## 2016-06-07 DIAGNOSIS — H548 Legal blindness, as defined in USA: Secondary | ICD-10-CM | POA: Diagnosis not present

## 2016-06-07 DIAGNOSIS — C7900 Secondary malignant neoplasm of unspecified kidney and renal pelvis: Secondary | ICD-10-CM | POA: Diagnosis not present

## 2016-06-07 DIAGNOSIS — I2510 Atherosclerotic heart disease of native coronary artery without angina pectoris: Secondary | ICD-10-CM | POA: Diagnosis not present

## 2016-06-07 DIAGNOSIS — I25810 Atherosclerosis of coronary artery bypass graft(s) without angina pectoris: Secondary | ICD-10-CM | POA: Diagnosis not present

## 2016-06-07 DIAGNOSIS — C641 Malignant neoplasm of right kidney, except renal pelvis: Secondary | ICD-10-CM | POA: Diagnosis not present

## 2016-06-07 DIAGNOSIS — Z483 Aftercare following surgery for neoplasm: Secondary | ICD-10-CM | POA: Diagnosis not present

## 2016-06-07 DIAGNOSIS — Z992 Dependence on renal dialysis: Secondary | ICD-10-CM | POA: Diagnosis not present

## 2016-06-07 DIAGNOSIS — I10 Essential (primary) hypertension: Secondary | ICD-10-CM | POA: Diagnosis not present

## 2016-06-07 DIAGNOSIS — N390 Urinary tract infection, site not specified: Secondary | ICD-10-CM | POA: Diagnosis not present

## 2016-06-08 DIAGNOSIS — N186 End stage renal disease: Secondary | ICD-10-CM | POA: Diagnosis not present

## 2016-06-08 DIAGNOSIS — D509 Iron deficiency anemia, unspecified: Secondary | ICD-10-CM | POA: Diagnosis not present

## 2016-06-08 DIAGNOSIS — E119 Type 2 diabetes mellitus without complications: Secondary | ICD-10-CM | POA: Diagnosis not present

## 2016-06-08 DIAGNOSIS — K7689 Other specified diseases of liver: Secondary | ICD-10-CM | POA: Diagnosis not present

## 2016-06-08 DIAGNOSIS — D631 Anemia in chronic kidney disease: Secondary | ICD-10-CM | POA: Diagnosis not present

## 2016-06-08 DIAGNOSIS — N2581 Secondary hyperparathyroidism of renal origin: Secondary | ICD-10-CM | POA: Diagnosis not present

## 2016-06-09 DIAGNOSIS — C641 Malignant neoplasm of right kidney, except renal pelvis: Secondary | ICD-10-CM | POA: Diagnosis not present

## 2016-06-09 DIAGNOSIS — I2510 Atherosclerotic heart disease of native coronary artery without angina pectoris: Secondary | ICD-10-CM | POA: Diagnosis not present

## 2016-06-09 DIAGNOSIS — Z483 Aftercare following surgery for neoplasm: Secondary | ICD-10-CM | POA: Diagnosis not present

## 2016-06-09 DIAGNOSIS — E1122 Type 2 diabetes mellitus with diabetic chronic kidney disease: Secondary | ICD-10-CM | POA: Diagnosis not present

## 2016-06-09 DIAGNOSIS — H548 Legal blindness, as defined in USA: Secondary | ICD-10-CM | POA: Diagnosis not present

## 2016-06-09 DIAGNOSIS — N186 End stage renal disease: Secondary | ICD-10-CM | POA: Diagnosis not present

## 2016-06-10 DIAGNOSIS — D509 Iron deficiency anemia, unspecified: Secondary | ICD-10-CM | POA: Diagnosis not present

## 2016-06-10 DIAGNOSIS — E119 Type 2 diabetes mellitus without complications: Secondary | ICD-10-CM | POA: Diagnosis not present

## 2016-06-10 DIAGNOSIS — K7689 Other specified diseases of liver: Secondary | ICD-10-CM | POA: Diagnosis not present

## 2016-06-10 DIAGNOSIS — N186 End stage renal disease: Secondary | ICD-10-CM | POA: Diagnosis not present

## 2016-06-10 DIAGNOSIS — D631 Anemia in chronic kidney disease: Secondary | ICD-10-CM | POA: Diagnosis not present

## 2016-06-10 DIAGNOSIS — N2581 Secondary hyperparathyroidism of renal origin: Secondary | ICD-10-CM | POA: Diagnosis not present

## 2016-06-13 DIAGNOSIS — D631 Anemia in chronic kidney disease: Secondary | ICD-10-CM | POA: Diagnosis not present

## 2016-06-13 DIAGNOSIS — E119 Type 2 diabetes mellitus without complications: Secondary | ICD-10-CM | POA: Diagnosis not present

## 2016-06-13 DIAGNOSIS — N186 End stage renal disease: Secondary | ICD-10-CM | POA: Diagnosis not present

## 2016-06-13 DIAGNOSIS — K7689 Other specified diseases of liver: Secondary | ICD-10-CM | POA: Diagnosis not present

## 2016-06-13 DIAGNOSIS — N2581 Secondary hyperparathyroidism of renal origin: Secondary | ICD-10-CM | POA: Diagnosis not present

## 2016-06-13 DIAGNOSIS — D509 Iron deficiency anemia, unspecified: Secondary | ICD-10-CM | POA: Diagnosis not present

## 2016-06-15 DIAGNOSIS — D631 Anemia in chronic kidney disease: Secondary | ICD-10-CM | POA: Diagnosis not present

## 2016-06-15 DIAGNOSIS — K7689 Other specified diseases of liver: Secondary | ICD-10-CM | POA: Diagnosis not present

## 2016-06-15 DIAGNOSIS — E119 Type 2 diabetes mellitus without complications: Secondary | ICD-10-CM | POA: Diagnosis not present

## 2016-06-15 DIAGNOSIS — N186 End stage renal disease: Secondary | ICD-10-CM | POA: Diagnosis not present

## 2016-06-15 DIAGNOSIS — N2581 Secondary hyperparathyroidism of renal origin: Secondary | ICD-10-CM | POA: Diagnosis not present

## 2016-06-15 DIAGNOSIS — D509 Iron deficiency anemia, unspecified: Secondary | ICD-10-CM | POA: Diagnosis not present

## 2016-06-17 DIAGNOSIS — D509 Iron deficiency anemia, unspecified: Secondary | ICD-10-CM | POA: Diagnosis not present

## 2016-06-17 DIAGNOSIS — E119 Type 2 diabetes mellitus without complications: Secondary | ICD-10-CM | POA: Diagnosis not present

## 2016-06-17 DIAGNOSIS — D631 Anemia in chronic kidney disease: Secondary | ICD-10-CM | POA: Diagnosis not present

## 2016-06-17 DIAGNOSIS — N2581 Secondary hyperparathyroidism of renal origin: Secondary | ICD-10-CM | POA: Diagnosis not present

## 2016-06-17 DIAGNOSIS — K7689 Other specified diseases of liver: Secondary | ICD-10-CM | POA: Diagnosis not present

## 2016-06-17 DIAGNOSIS — N186 End stage renal disease: Secondary | ICD-10-CM | POA: Diagnosis not present

## 2016-06-20 DIAGNOSIS — E119 Type 2 diabetes mellitus without complications: Secondary | ICD-10-CM | POA: Diagnosis not present

## 2016-06-20 DIAGNOSIS — N2581 Secondary hyperparathyroidism of renal origin: Secondary | ICD-10-CM | POA: Diagnosis not present

## 2016-06-20 DIAGNOSIS — D509 Iron deficiency anemia, unspecified: Secondary | ICD-10-CM | POA: Diagnosis not present

## 2016-06-20 DIAGNOSIS — K7689 Other specified diseases of liver: Secondary | ICD-10-CM | POA: Diagnosis not present

## 2016-06-20 DIAGNOSIS — N186 End stage renal disease: Secondary | ICD-10-CM | POA: Diagnosis not present

## 2016-06-20 DIAGNOSIS — D631 Anemia in chronic kidney disease: Secondary | ICD-10-CM | POA: Diagnosis not present

## 2016-06-22 DIAGNOSIS — N2581 Secondary hyperparathyroidism of renal origin: Secondary | ICD-10-CM | POA: Diagnosis not present

## 2016-06-22 DIAGNOSIS — N186 End stage renal disease: Secondary | ICD-10-CM | POA: Diagnosis not present

## 2016-06-22 DIAGNOSIS — K7689 Other specified diseases of liver: Secondary | ICD-10-CM | POA: Diagnosis not present

## 2016-06-22 DIAGNOSIS — D509 Iron deficiency anemia, unspecified: Secondary | ICD-10-CM | POA: Diagnosis not present

## 2016-06-22 DIAGNOSIS — D631 Anemia in chronic kidney disease: Secondary | ICD-10-CM | POA: Diagnosis not present

## 2016-06-22 DIAGNOSIS — E119 Type 2 diabetes mellitus without complications: Secondary | ICD-10-CM | POA: Diagnosis not present

## 2016-06-24 DIAGNOSIS — K7689 Other specified diseases of liver: Secondary | ICD-10-CM | POA: Diagnosis not present

## 2016-06-24 DIAGNOSIS — E119 Type 2 diabetes mellitus without complications: Secondary | ICD-10-CM | POA: Diagnosis not present

## 2016-06-24 DIAGNOSIS — D631 Anemia in chronic kidney disease: Secondary | ICD-10-CM | POA: Diagnosis not present

## 2016-06-24 DIAGNOSIS — N2581 Secondary hyperparathyroidism of renal origin: Secondary | ICD-10-CM | POA: Diagnosis not present

## 2016-06-24 DIAGNOSIS — N186 End stage renal disease: Secondary | ICD-10-CM | POA: Diagnosis not present

## 2016-06-24 DIAGNOSIS — D509 Iron deficiency anemia, unspecified: Secondary | ICD-10-CM | POA: Diagnosis not present

## 2016-06-27 DIAGNOSIS — D509 Iron deficiency anemia, unspecified: Secondary | ICD-10-CM | POA: Diagnosis not present

## 2016-06-27 DIAGNOSIS — D631 Anemia in chronic kidney disease: Secondary | ICD-10-CM | POA: Diagnosis not present

## 2016-06-27 DIAGNOSIS — E119 Type 2 diabetes mellitus without complications: Secondary | ICD-10-CM | POA: Diagnosis not present

## 2016-06-27 DIAGNOSIS — N186 End stage renal disease: Secondary | ICD-10-CM | POA: Diagnosis not present

## 2016-06-27 DIAGNOSIS — K7689 Other specified diseases of liver: Secondary | ICD-10-CM | POA: Diagnosis not present

## 2016-06-27 DIAGNOSIS — N2581 Secondary hyperparathyroidism of renal origin: Secondary | ICD-10-CM | POA: Diagnosis not present

## 2016-06-29 DIAGNOSIS — D509 Iron deficiency anemia, unspecified: Secondary | ICD-10-CM | POA: Diagnosis not present

## 2016-06-29 DIAGNOSIS — N2581 Secondary hyperparathyroidism of renal origin: Secondary | ICD-10-CM | POA: Diagnosis not present

## 2016-06-29 DIAGNOSIS — D631 Anemia in chronic kidney disease: Secondary | ICD-10-CM | POA: Diagnosis not present

## 2016-06-29 DIAGNOSIS — N186 End stage renal disease: Secondary | ICD-10-CM | POA: Diagnosis not present

## 2016-06-29 DIAGNOSIS — K7689 Other specified diseases of liver: Secondary | ICD-10-CM | POA: Diagnosis not present

## 2016-06-29 DIAGNOSIS — E119 Type 2 diabetes mellitus without complications: Secondary | ICD-10-CM | POA: Diagnosis not present

## 2016-07-01 DIAGNOSIS — N186 End stage renal disease: Secondary | ICD-10-CM | POA: Diagnosis not present

## 2016-07-01 DIAGNOSIS — D509 Iron deficiency anemia, unspecified: Secondary | ICD-10-CM | POA: Diagnosis not present

## 2016-07-01 DIAGNOSIS — K7689 Other specified diseases of liver: Secondary | ICD-10-CM | POA: Diagnosis not present

## 2016-07-01 DIAGNOSIS — N2581 Secondary hyperparathyroidism of renal origin: Secondary | ICD-10-CM | POA: Diagnosis not present

## 2016-07-01 DIAGNOSIS — E119 Type 2 diabetes mellitus without complications: Secondary | ICD-10-CM | POA: Diagnosis not present

## 2016-07-01 DIAGNOSIS — D631 Anemia in chronic kidney disease: Secondary | ICD-10-CM | POA: Diagnosis not present

## 2016-07-02 DIAGNOSIS — N186 End stage renal disease: Secondary | ICD-10-CM | POA: Diagnosis not present

## 2016-07-02 DIAGNOSIS — Z992 Dependence on renal dialysis: Secondary | ICD-10-CM | POA: Diagnosis not present

## 2016-07-02 DIAGNOSIS — E1129 Type 2 diabetes mellitus with other diabetic kidney complication: Secondary | ICD-10-CM | POA: Diagnosis not present

## 2016-07-04 DIAGNOSIS — N2581 Secondary hyperparathyroidism of renal origin: Secondary | ICD-10-CM | POA: Diagnosis not present

## 2016-07-04 DIAGNOSIS — E119 Type 2 diabetes mellitus without complications: Secondary | ICD-10-CM | POA: Diagnosis not present

## 2016-07-04 DIAGNOSIS — C649 Malignant neoplasm of unspecified kidney, except renal pelvis: Secondary | ICD-10-CM | POA: Diagnosis not present

## 2016-07-04 DIAGNOSIS — D509 Iron deficiency anemia, unspecified: Secondary | ICD-10-CM | POA: Diagnosis not present

## 2016-07-04 DIAGNOSIS — N186 End stage renal disease: Secondary | ICD-10-CM | POA: Diagnosis not present

## 2016-07-06 DIAGNOSIS — C649 Malignant neoplasm of unspecified kidney, except renal pelvis: Secondary | ICD-10-CM | POA: Diagnosis not present

## 2016-07-06 DIAGNOSIS — N186 End stage renal disease: Secondary | ICD-10-CM | POA: Diagnosis not present

## 2016-07-06 DIAGNOSIS — N2581 Secondary hyperparathyroidism of renal origin: Secondary | ICD-10-CM | POA: Diagnosis not present

## 2016-07-06 DIAGNOSIS — D509 Iron deficiency anemia, unspecified: Secondary | ICD-10-CM | POA: Diagnosis not present

## 2016-07-06 DIAGNOSIS — E119 Type 2 diabetes mellitus without complications: Secondary | ICD-10-CM | POA: Diagnosis not present

## 2016-07-08 DIAGNOSIS — E119 Type 2 diabetes mellitus without complications: Secondary | ICD-10-CM | POA: Diagnosis not present

## 2016-07-08 DIAGNOSIS — D509 Iron deficiency anemia, unspecified: Secondary | ICD-10-CM | POA: Diagnosis not present

## 2016-07-08 DIAGNOSIS — N2581 Secondary hyperparathyroidism of renal origin: Secondary | ICD-10-CM | POA: Diagnosis not present

## 2016-07-08 DIAGNOSIS — C649 Malignant neoplasm of unspecified kidney, except renal pelvis: Secondary | ICD-10-CM | POA: Diagnosis not present

## 2016-07-08 DIAGNOSIS — N186 End stage renal disease: Secondary | ICD-10-CM | POA: Diagnosis not present

## 2016-07-11 DIAGNOSIS — C649 Malignant neoplasm of unspecified kidney, except renal pelvis: Secondary | ICD-10-CM | POA: Diagnosis not present

## 2016-07-11 DIAGNOSIS — D509 Iron deficiency anemia, unspecified: Secondary | ICD-10-CM | POA: Diagnosis not present

## 2016-07-11 DIAGNOSIS — N2581 Secondary hyperparathyroidism of renal origin: Secondary | ICD-10-CM | POA: Diagnosis not present

## 2016-07-11 DIAGNOSIS — E119 Type 2 diabetes mellitus without complications: Secondary | ICD-10-CM | POA: Diagnosis not present

## 2016-07-11 DIAGNOSIS — N186 End stage renal disease: Secondary | ICD-10-CM | POA: Diagnosis not present

## 2016-07-13 DIAGNOSIS — N2581 Secondary hyperparathyroidism of renal origin: Secondary | ICD-10-CM | POA: Diagnosis not present

## 2016-07-13 DIAGNOSIS — D509 Iron deficiency anemia, unspecified: Secondary | ICD-10-CM | POA: Diagnosis not present

## 2016-07-13 DIAGNOSIS — E119 Type 2 diabetes mellitus without complications: Secondary | ICD-10-CM | POA: Diagnosis not present

## 2016-07-13 DIAGNOSIS — N186 End stage renal disease: Secondary | ICD-10-CM | POA: Diagnosis not present

## 2016-07-13 DIAGNOSIS — E1129 Type 2 diabetes mellitus with other diabetic kidney complication: Secondary | ICD-10-CM | POA: Diagnosis not present

## 2016-07-13 DIAGNOSIS — C649 Malignant neoplasm of unspecified kidney, except renal pelvis: Secondary | ICD-10-CM | POA: Diagnosis not present

## 2016-07-14 DIAGNOSIS — R918 Other nonspecific abnormal finding of lung field: Secondary | ICD-10-CM | POA: Diagnosis not present

## 2016-07-14 DIAGNOSIS — R935 Abnormal findings on diagnostic imaging of other abdominal regions, including retroperitoneum: Secondary | ICD-10-CM | POA: Diagnosis not present

## 2016-07-14 DIAGNOSIS — C641 Malignant neoplasm of right kidney, except renal pelvis: Secondary | ICD-10-CM | POA: Diagnosis not present

## 2016-07-14 DIAGNOSIS — C642 Malignant neoplasm of left kidney, except renal pelvis: Secondary | ICD-10-CM | POA: Diagnosis not present

## 2016-07-15 DIAGNOSIS — D509 Iron deficiency anemia, unspecified: Secondary | ICD-10-CM | POA: Diagnosis not present

## 2016-07-15 DIAGNOSIS — N2581 Secondary hyperparathyroidism of renal origin: Secondary | ICD-10-CM | POA: Diagnosis not present

## 2016-07-15 DIAGNOSIS — N186 End stage renal disease: Secondary | ICD-10-CM | POA: Diagnosis not present

## 2016-07-15 DIAGNOSIS — C649 Malignant neoplasm of unspecified kidney, except renal pelvis: Secondary | ICD-10-CM | POA: Diagnosis not present

## 2016-07-15 DIAGNOSIS — E119 Type 2 diabetes mellitus without complications: Secondary | ICD-10-CM | POA: Diagnosis not present

## 2016-07-18 DIAGNOSIS — E119 Type 2 diabetes mellitus without complications: Secondary | ICD-10-CM | POA: Diagnosis not present

## 2016-07-18 DIAGNOSIS — N186 End stage renal disease: Secondary | ICD-10-CM | POA: Diagnosis not present

## 2016-07-18 DIAGNOSIS — C649 Malignant neoplasm of unspecified kidney, except renal pelvis: Secondary | ICD-10-CM | POA: Diagnosis not present

## 2016-07-18 DIAGNOSIS — N2581 Secondary hyperparathyroidism of renal origin: Secondary | ICD-10-CM | POA: Diagnosis not present

## 2016-07-18 DIAGNOSIS — D509 Iron deficiency anemia, unspecified: Secondary | ICD-10-CM | POA: Diagnosis not present

## 2016-07-20 DIAGNOSIS — N186 End stage renal disease: Secondary | ICD-10-CM | POA: Diagnosis not present

## 2016-07-20 DIAGNOSIS — E119 Type 2 diabetes mellitus without complications: Secondary | ICD-10-CM | POA: Diagnosis not present

## 2016-07-20 DIAGNOSIS — D509 Iron deficiency anemia, unspecified: Secondary | ICD-10-CM | POA: Diagnosis not present

## 2016-07-20 DIAGNOSIS — N2581 Secondary hyperparathyroidism of renal origin: Secondary | ICD-10-CM | POA: Diagnosis not present

## 2016-07-20 DIAGNOSIS — C649 Malignant neoplasm of unspecified kidney, except renal pelvis: Secondary | ICD-10-CM | POA: Diagnosis not present

## 2016-07-21 ENCOUNTER — Telehealth: Payer: Self-pay | Admitting: *Deleted

## 2016-07-21 NOTE — Telephone Encounter (Signed)
Pt's wife, Velva Harman states it is time for pt to get new orthopedic shoes. Routed message to Romeo Apple, Pedorothist to contact re:  Diabetic shoes.

## 2016-07-22 DIAGNOSIS — C649 Malignant neoplasm of unspecified kidney, except renal pelvis: Secondary | ICD-10-CM | POA: Diagnosis not present

## 2016-07-22 DIAGNOSIS — E119 Type 2 diabetes mellitus without complications: Secondary | ICD-10-CM | POA: Diagnosis not present

## 2016-07-22 DIAGNOSIS — D509 Iron deficiency anemia, unspecified: Secondary | ICD-10-CM | POA: Diagnosis not present

## 2016-07-22 DIAGNOSIS — N2581 Secondary hyperparathyroidism of renal origin: Secondary | ICD-10-CM | POA: Diagnosis not present

## 2016-07-22 DIAGNOSIS — N186 End stage renal disease: Secondary | ICD-10-CM | POA: Diagnosis not present

## 2016-07-25 DIAGNOSIS — N186 End stage renal disease: Secondary | ICD-10-CM | POA: Diagnosis not present

## 2016-07-25 DIAGNOSIS — C649 Malignant neoplasm of unspecified kidney, except renal pelvis: Secondary | ICD-10-CM | POA: Diagnosis not present

## 2016-07-25 DIAGNOSIS — E119 Type 2 diabetes mellitus without complications: Secondary | ICD-10-CM | POA: Diagnosis not present

## 2016-07-25 DIAGNOSIS — N2581 Secondary hyperparathyroidism of renal origin: Secondary | ICD-10-CM | POA: Diagnosis not present

## 2016-07-25 DIAGNOSIS — D509 Iron deficiency anemia, unspecified: Secondary | ICD-10-CM | POA: Diagnosis not present

## 2016-07-27 DIAGNOSIS — N186 End stage renal disease: Secondary | ICD-10-CM | POA: Diagnosis not present

## 2016-07-27 DIAGNOSIS — C649 Malignant neoplasm of unspecified kidney, except renal pelvis: Secondary | ICD-10-CM | POA: Diagnosis not present

## 2016-07-27 DIAGNOSIS — E119 Type 2 diabetes mellitus without complications: Secondary | ICD-10-CM | POA: Diagnosis not present

## 2016-07-27 DIAGNOSIS — N2581 Secondary hyperparathyroidism of renal origin: Secondary | ICD-10-CM | POA: Diagnosis not present

## 2016-07-27 DIAGNOSIS — D509 Iron deficiency anemia, unspecified: Secondary | ICD-10-CM | POA: Diagnosis not present

## 2016-07-29 DIAGNOSIS — E119 Type 2 diabetes mellitus without complications: Secondary | ICD-10-CM | POA: Diagnosis not present

## 2016-07-29 DIAGNOSIS — N2581 Secondary hyperparathyroidism of renal origin: Secondary | ICD-10-CM | POA: Diagnosis not present

## 2016-07-29 DIAGNOSIS — N186 End stage renal disease: Secondary | ICD-10-CM | POA: Diagnosis not present

## 2016-07-29 DIAGNOSIS — D509 Iron deficiency anemia, unspecified: Secondary | ICD-10-CM | POA: Diagnosis not present

## 2016-07-29 DIAGNOSIS — C649 Malignant neoplasm of unspecified kidney, except renal pelvis: Secondary | ICD-10-CM | POA: Diagnosis not present

## 2016-08-01 DIAGNOSIS — E119 Type 2 diabetes mellitus without complications: Secondary | ICD-10-CM | POA: Diagnosis not present

## 2016-08-01 DIAGNOSIS — Z992 Dependence on renal dialysis: Secondary | ICD-10-CM | POA: Diagnosis not present

## 2016-08-01 DIAGNOSIS — N2581 Secondary hyperparathyroidism of renal origin: Secondary | ICD-10-CM | POA: Diagnosis not present

## 2016-08-01 DIAGNOSIS — C649 Malignant neoplasm of unspecified kidney, except renal pelvis: Secondary | ICD-10-CM | POA: Diagnosis not present

## 2016-08-01 DIAGNOSIS — D509 Iron deficiency anemia, unspecified: Secondary | ICD-10-CM | POA: Diagnosis not present

## 2016-08-01 DIAGNOSIS — E1129 Type 2 diabetes mellitus with other diabetic kidney complication: Secondary | ICD-10-CM | POA: Diagnosis not present

## 2016-08-01 DIAGNOSIS — N186 End stage renal disease: Secondary | ICD-10-CM | POA: Diagnosis not present

## 2016-08-03 DIAGNOSIS — N2581 Secondary hyperparathyroidism of renal origin: Secondary | ICD-10-CM | POA: Diagnosis not present

## 2016-08-03 DIAGNOSIS — D509 Iron deficiency anemia, unspecified: Secondary | ICD-10-CM | POA: Diagnosis not present

## 2016-08-03 DIAGNOSIS — K7689 Other specified diseases of liver: Secondary | ICD-10-CM | POA: Diagnosis not present

## 2016-08-03 DIAGNOSIS — D631 Anemia in chronic kidney disease: Secondary | ICD-10-CM | POA: Diagnosis not present

## 2016-08-03 DIAGNOSIS — E119 Type 2 diabetes mellitus without complications: Secondary | ICD-10-CM | POA: Diagnosis not present

## 2016-08-03 DIAGNOSIS — N186 End stage renal disease: Secondary | ICD-10-CM | POA: Diagnosis not present

## 2016-08-05 DIAGNOSIS — N186 End stage renal disease: Secondary | ICD-10-CM | POA: Diagnosis not present

## 2016-08-05 DIAGNOSIS — N2581 Secondary hyperparathyroidism of renal origin: Secondary | ICD-10-CM | POA: Diagnosis not present

## 2016-08-05 DIAGNOSIS — D509 Iron deficiency anemia, unspecified: Secondary | ICD-10-CM | POA: Diagnosis not present

## 2016-08-05 DIAGNOSIS — K7689 Other specified diseases of liver: Secondary | ICD-10-CM | POA: Diagnosis not present

## 2016-08-05 DIAGNOSIS — E119 Type 2 diabetes mellitus without complications: Secondary | ICD-10-CM | POA: Diagnosis not present

## 2016-08-08 DIAGNOSIS — N2581 Secondary hyperparathyroidism of renal origin: Secondary | ICD-10-CM | POA: Diagnosis not present

## 2016-08-08 DIAGNOSIS — E119 Type 2 diabetes mellitus without complications: Secondary | ICD-10-CM | POA: Diagnosis not present

## 2016-08-08 DIAGNOSIS — D509 Iron deficiency anemia, unspecified: Secondary | ICD-10-CM | POA: Diagnosis not present

## 2016-08-08 DIAGNOSIS — N186 End stage renal disease: Secondary | ICD-10-CM | POA: Diagnosis not present

## 2016-08-08 DIAGNOSIS — K7689 Other specified diseases of liver: Secondary | ICD-10-CM | POA: Diagnosis not present

## 2016-08-09 ENCOUNTER — Ambulatory Visit (INDEPENDENT_AMBULATORY_CARE_PROVIDER_SITE_OTHER): Payer: Medicare Other | Admitting: Cardiology

## 2016-08-09 VITALS — BP 114/56 | HR 58 | Ht 76.0 in | Wt 200.8 lb

## 2016-08-09 DIAGNOSIS — R001 Bradycardia, unspecified: Secondary | ICD-10-CM | POA: Diagnosis not present

## 2016-08-09 DIAGNOSIS — I953 Hypotension of hemodialysis: Secondary | ICD-10-CM

## 2016-08-09 NOTE — Patient Instructions (Signed)
Medication Instructions:   No changes  Labwork:   None ordered  Testing/Procedures:  24 hr Holter monitor  Your physician has recommended that you wear a holter monitor. Holter monitors are medical devices that record the heart's electrical activity. Doctors most often use these monitors to diagnose arrhythmias. Arrhythmias are problems with the speed or rhythm of the heartbeat. The monitor is a small, portable device. You can wear one while you do your normal daily activities. This is usually used to diagnose what is causing palpitations/syncope (passing out).    Follow-Up:  6 months with Dr. Percival Spanish    If you need a refill on your cardiac medications before your next appointment, please call your pharmacy.

## 2016-08-09 NOTE — Progress Notes (Signed)
Cardiology Office Note   Date:  08/10/2016   ID:  [name], DOB 1950/06/24, MRN [id number]  PCP:  [name], MD  Cardiologist:   [name], MD   Chief Complaint  Patient presents with  . Bradycardia      History of Present Illness: [name] is a 66 y.o. male who presents for the patient has a history of coronary artery disease with diffuse diabetic disease status post bypass.  He had a Myoview in 2013 which was abnormal. Cardiac cath demonstrated significant disease and he underwent CABG with a LIMA to the LAD and SVG to OM. He had right nephrectomy in October 2016 for recurrent renal cancer. He's on the renal transplant list and had a Lexis scan Myoview at [facility] in February 2017. He did have surgery ultimately because of infection. He came back in October for preoperative clearance again and saw Kerin Ransom.  He again did not get the transplant and got his kidney removed.  He subsequently had resection of a retroperitoneal mass that was found to be clear cell.   Echo in Jan during that hospitalization was normal EF with poor windows.    He is sent today because he's been noted to have bradycardic episodes with his heart rate in the 40s and 50s during dialysis. He has some low blood pressures and takes midodrine.  However, he says he doesn't ever have to come off of dialysis early. He might get lightheaded rarely but he doesn't have any presyncope. He's blind and has significant mobility issues but still manages to exercise every day. He never did receive his renal transplant but he still hoping to get on the list. He denies any chest pressure, neck or arm discomfort. He's had no shortness of breath, PND or orthopnea.   Past Medical History:  Diagnosis Date  . Anemia   . Arthritis    "back, neck" (07/28/2014)  . Blind    both eyes removed   . Bruises easily   . CAD, NATIVE VESSEL 01/08/2008      . Cellulitis late 1980's   "hospitalized; wrapped both legs; several  times; no OR for this"  . Colon polyps   . Diabetic neuropathy ([place])   . DM type 2 (diabetes mellitus, type 2) ([place])    no medications (07/28/2014) diet and excersie controlled  . ESRD (end stage renal disease) on dialysis [facility])    [name]; Mon, Wed, Fri (07/28/2014)  . Family history of breast cancer    mother  . Heart murmur    hx of  . History of blood product transfusion   . HYPERLIPIDEMIA-MIXED 01/08/2008  . HYPERTENSION 01/27/2009   BP low , hx of HTN, Currently on meds to raise BP  . Hypotension   . Kidney stones   . Low blood pressure   . OVERWEIGHT/OBESITY 01/08/2008   Lost 205 lbs through diet and exercise.    . Peripheral vascular disease ([place])    vein stripping  . Pneumonia 2000;s X 1  . Prostate cancer ([place])   . Prosthetic eye globe    both eyes  . Renal cell carcinoma 2001 and 2003   "both kidneys"  . Renal insufficiency     Past Surgical History:  Procedure Laterality Date  . AV FISTULA PLACEMENT Left 02/2010   LFA  . AV FISTULA PLACEMENT Left 01/12/2016   Procedure: LEFT BRACHIOCEPHALIC ARTERIOVENOUS (AV) FISTULA CREATION;  Surgeon: Angelia Mould, MD;  Location: [place];  Service: Vascular;  Laterality: Left;  . CHOLECYSTECTOMY OPEN  1992  . COLONOSCOPY  06/14/2011   Procedure: COLONOSCOPY;  Surgeon: [name], MD;  Location: [facility] ENDOSCOPY;  Service: Endoscopy;  Laterality: N/A;  . COLONOSCOPY N/A 07/24/2012   Procedure: COLONOSCOPY;  Surgeon: [name], MD;  Location: [facility];  Service: Endoscopy;  Laterality: N/A;  . COLONOSCOPY    . COLONOSCOPY N/A 11/04/2015   Procedure: COLONOSCOPY;  Surgeon: Manus Gunning, MD;  Location[facility]COPY;  Service: Gastroenterology;  Laterality: N/A;  . COLONOSCOPY WITH PROPOFOL N/A 05/13/2014   Procedure: COLONOSCOPY WITH PROPOFOL;  Surgeon: [name], MD;  Location: [facility]OS[facility];  Service: Endoscopy;  Laterality: N/A;  . CORONARY ARTERY BYPASS GRAFT  09/29/2011   Procedure: CORONARY  ARTERY BYPASS GRAFTING (CABG);  Surgeon: [name], MD;  Location: [place];  Service: Open Heart Surgery;  Laterality: N/A;  Coronary Artery Bypass Graft times two utilizing the left internal mammary artery and the right greater saphenous vein harvested endoscopically.  . CYSTOSCOPY WITH RETROGRADE PYELOGRAM, URETEROSCOPY AND STENT PLACEMENT Left 07/28/2014   Procedure: CYSTOSCOPY WITH RETROGRADE PYELOGRAM, URETERAL BALLOON DILITATION, URETEROSCOPY AND LEFT STENT PLACEMENT;  Surgeon: [name], MD;  Location: [facility];  Service: Urology;  Laterality: Left;  . ENUCLEATION  2003; 2006   bilateral; "diabetes; pain"  . FOOT AMPUTATION Right ~ 2002   right;  partial; "infection"  . FRACTURE SURGERY     hip fx  . HIP PINNING,CANNULATED Left 12/31/2013   Procedure: CANNULATED HIP PINNING;  Surgeon: Renette Butters, MD;  Location: [place];  Service: Orthopedics;  Laterality: Left;  Carm, FX Table, [employer]  . HOT HEMOSTASIS  06/14/2011   Procedure: HOT HEMOSTASIS (ARGON PLASMA COAGULATION/BICAP);  Surgeon: [name], MD;  Location: [facility];  Service: Endoscopy;  Laterality: N/A;  . INSERTION OF DIALYSIS CATHETER Right 01/12/2016   Procedure: INSERTION OF DIALYSIS CATHETER;  Surgeon: Angelia Mould, MD;  Location: [place];  Service: Vascular;  Laterality: Right;  . INSERTION PROSTATE RADIATION SEED  03/2012  . IR GENERIC HISTORICAL  01/13/2016   IR RADIOLOGIST EVAL & MGMT 01/13/2016 Aletta Edouard, MD [facility] INTERV RAD  . LAPAROTOMY N/A 04/07/2016   Procedure: EXPLORATORY LAPAROTOMY AND RESECTION OF RETROPERITONEAL MASS;  Surgeon: Raynelle Bring, MD;  [facility];  Service: Urology;  Laterality: N/A;  . LEFT HEART CATHETERIZATION WITH CORONARY ANGIOGRAM N/A 09/27/2011   Procedure: LEFT HEART CATHETERIZATION WITH CORONARY ANGIOGRAM;  Surgeon: [name], MD;  Location: [facility] CATH LAB;  Service: Cardiovascular;  Laterality: N/A;  . LIGATION OF ARTERIOVENOUS  FISTULA Left 01/12/2016    Procedure: LIGATION OF LEFT RADIOCEPHALIC ARTERIOVENOUS  FISTULA;  Surgeon: Angelia Mould, MD;  Location: [place];  Service: Vascular;  Laterality: Left;  . LIGATION OF COMPETING BRANCHES OF ARTERIOVENOUS FISTULA Left 07/29/2014   Procedure: LIGATION OF COMPETING BRANCHES OF LEFT ARM ARTERIOVENOUS FISTULA;  Surgeon: [name], MD;  Location: [place];  Service: Vascular;  Laterality: Left;  . NEPHRECTOMY Right 01/30/2015   done at [place]  . PARTIAL COLECTOMY Right 04/07/2016   Procedure: RIGHT COLECTOMY AND PARTIAL LIVER RESECTION;  Surgeon: Raynelle Bring, MD;  [facility];  Service: Urology;  Laterality: Right;  . RADIOFREQUENCY ABLATION KIDNEY  2010-2012   "twice; one on each side; for cancer"  . REVISON OF ARTERIOVENOUS FISTULA Left 09/01/2015   Procedure: EXPLORATION LEFT LOWER ARM  RADIOCEPHALIC ARTERIOVENOUS FISTULA;  Surgeon: Conrad , MD;  Location:  [place] [facility];  Service: Vascular;  Laterality: Left;  [name] VARICOSE VEIN SURGERY  mid 1980's    BLE; "knees down; both legs; 2 separate times"     Current Outpatient Prescriptions  Medication Sig Dispense Refill  . aspirin EC 81 MG tablet Take 81 mg by mouth at bedtime.    [name] atorvastatin (LIPITOR) 10 MG tablet Take 10 mg by mouth daily.    . cinacalcet (SENSIPAR) 90 MG tablet Take 90 mg by mouth daily.    . ferrous sulfate 325 (65 FE) MG tablet Take 1 tablet (325 mg total) by mouth daily with breakfast. 90 tablet 3  . Methoxy PEG-Epoetin Beta (MIRCERA) 50 MCG/0.3ML SOSY Inject 225 mcg as directed every 14 (fourteen) days.     . midodrine (PROAMATINE) 10 MG tablet Take 1 tablet (10 mg total) by mouth 3 (three) times daily with meals. 90 tablet 0  . multivitamin (RENA-VIT) TABS tablet Take 1 tablet by mouth at bedtime.    . Probiotic Product (ALIGN) 4 MG CAPS Take 1 capsule by mouth daily.    . sevelamer carbonate (RENVELA) 800 MG tablet Take 2,400-4,800 mg by mouth See admin instructions. Take 6 tablets (4800 mg) by mouth 3  times daily with meals and 3 tablets (2400 mg) 2 times daily with snacks     No current facility-administered medications for this visit.    Facility-Administered Medications Ordered in Other Visits  Medication Dose Route Frequency Provider Last Rate Last Dose  . Chlorhexidine Gluconate Cloth 2 % PADS 6 each  6 each Topical Once [name], MD        Allergies:   Codeine and Tape     ROS:  Please see the history of present illness.   Otherwise, review of systems are positive for blindness, decreased gait. .   All other systems are reviewed and negative.    PHYSICAL EXAM: VS:  BP (!) 114/56   Pulse (!) 58   Ht 6\' 4"  (1.93 m)   Wt 200 lb 12.8 oz (91.1 kg)   BMI 24.44 kg/m  , BMI Body mass index is 24.44 kg/m. GENERAL:  Frail appearing and chronically ill appearing.  HEENT:  He is status post bilateral enucleation.   NECK:  No jugular venous distention, waveform within normal limits, carotid upstroke brisk and symmetric, no bruits, no thyromegaly LUNGS:  Clear to auscultation bilaterally BACK:  No CVA tenderness CHEST:  Unremarkable HEART:  PMI not displaced or sustained,S1 and S2 within normal limits, no S3, no S4, no clicks, no rubs, no murmurs ABD:  Flat, positive bowel sounds normal in frequency in pitch, no bruits, no rebound, no guarding, no midline pulsatile mass, no hepatomegaly, no splenomegaly EXT:  2 plus pulses throughout, no edema, no cyanosis no clubbing, left arm fistula, amputations of his toes.      EKG:  EKG is ordered today. Sinus rhythm, rate 58, left axis deviation, poor anterior R wave progression, first-degree AV block, no acute ST-T wave changes.   Recent Labs: 04/08/2016: ALT 67 04/11/2016: Magnesium 2.5 04/12/2016: BUN 23; Creatinine, Ser 4.56; Hemoglobin 8.0; Platelets 91; Potassium 4.4; Sodium 136      Wt Readings from Last 3 Encounters:  08/09/16 200 lb 12.8 oz (91.1 kg)  04/13/16 205 lb 14.6 oz (93.4 kg)  02/19/16 197 lb 6.4 oz (89.5 kg)        Other studies Reviewed: Additional studies/ records that were reviewed today include: Office records and hospital records reviewed Review of the  above records demonstrates:  Please see elsewhere in the note.     ASSESSMENT AND PLAN:  Bradycardia He doesn't seem to be particularly symptomatic with this. He does have first-degree heart block. My plan will be a 24-hour Holter but if he remains asymptomatic and there is no more high-grade bradycardia arrhythmias noted and I don't suspect any change in therapy will be indicated.  Hx of CABG-2013 Myoview at [facility] Feb 2017 low risk.  The patient will continue with risk reduction.  End stage renal failure on dialysis ([place]) ESRD- HD MWF-Dr Deterding follows  Hypotension His blood pressure does run low but he's not particularly symptomatic with this. No change in therapy is indicated.     Current medicines are reviewed at length with the patient today.  The patient does not have concerns regarding medicines.  The following changes have been made:  no change  Labs/ tests ordered today include: None   Orders Placed This Encounter  Procedures  . Holter monitor - 24 hour  . EKG 12-Lead     Disposition:   FU with me in six months.    Signed, [name], MD  08/10/2016 8:18 PM    [place]

## 2016-08-10 ENCOUNTER — Encounter: Payer: Self-pay | Admitting: Cardiology

## 2016-08-10 DIAGNOSIS — E119 Type 2 diabetes mellitus without complications: Secondary | ICD-10-CM | POA: Diagnosis not present

## 2016-08-10 DIAGNOSIS — N186 End stage renal disease: Secondary | ICD-10-CM | POA: Diagnosis not present

## 2016-08-10 DIAGNOSIS — D509 Iron deficiency anemia, unspecified: Secondary | ICD-10-CM | POA: Diagnosis not present

## 2016-08-10 DIAGNOSIS — K7689 Other specified diseases of liver: Secondary | ICD-10-CM | POA: Diagnosis not present

## 2016-08-10 DIAGNOSIS — N2581 Secondary hyperparathyroidism of renal origin: Secondary | ICD-10-CM | POA: Diagnosis not present

## 2016-08-12 DIAGNOSIS — K7689 Other specified diseases of liver: Secondary | ICD-10-CM | POA: Diagnosis not present

## 2016-08-12 DIAGNOSIS — N186 End stage renal disease: Secondary | ICD-10-CM | POA: Diagnosis not present

## 2016-08-12 DIAGNOSIS — E119 Type 2 diabetes mellitus without complications: Secondary | ICD-10-CM | POA: Diagnosis not present

## 2016-08-12 DIAGNOSIS — N2581 Secondary hyperparathyroidism of renal origin: Secondary | ICD-10-CM | POA: Diagnosis not present

## 2016-08-12 DIAGNOSIS — D509 Iron deficiency anemia, unspecified: Secondary | ICD-10-CM | POA: Diagnosis not present

## 2016-08-15 DIAGNOSIS — K7689 Other specified diseases of liver: Secondary | ICD-10-CM | POA: Diagnosis not present

## 2016-08-15 DIAGNOSIS — N186 End stage renal disease: Secondary | ICD-10-CM | POA: Diagnosis not present

## 2016-08-15 DIAGNOSIS — E119 Type 2 diabetes mellitus without complications: Secondary | ICD-10-CM | POA: Diagnosis not present

## 2016-08-15 DIAGNOSIS — N2581 Secondary hyperparathyroidism of renal origin: Secondary | ICD-10-CM | POA: Diagnosis not present

## 2016-08-15 DIAGNOSIS — D509 Iron deficiency anemia, unspecified: Secondary | ICD-10-CM | POA: Diagnosis not present

## 2016-08-17 DIAGNOSIS — E119 Type 2 diabetes mellitus without complications: Secondary | ICD-10-CM | POA: Diagnosis not present

## 2016-08-17 DIAGNOSIS — D509 Iron deficiency anemia, unspecified: Secondary | ICD-10-CM | POA: Diagnosis not present

## 2016-08-17 DIAGNOSIS — N2581 Secondary hyperparathyroidism of renal origin: Secondary | ICD-10-CM | POA: Diagnosis not present

## 2016-08-17 DIAGNOSIS — N186 End stage renal disease: Secondary | ICD-10-CM | POA: Diagnosis not present

## 2016-08-17 DIAGNOSIS — K7689 Other specified diseases of liver: Secondary | ICD-10-CM | POA: Diagnosis not present

## 2016-08-18 ENCOUNTER — Ambulatory Visit (INDEPENDENT_AMBULATORY_CARE_PROVIDER_SITE_OTHER): Payer: Medicare Other

## 2016-08-18 DIAGNOSIS — R001 Bradycardia, unspecified: Secondary | ICD-10-CM | POA: Diagnosis not present

## 2016-08-19 DIAGNOSIS — E119 Type 2 diabetes mellitus without complications: Secondary | ICD-10-CM | POA: Diagnosis not present

## 2016-08-19 DIAGNOSIS — N2581 Secondary hyperparathyroidism of renal origin: Secondary | ICD-10-CM | POA: Diagnosis not present

## 2016-08-19 DIAGNOSIS — N186 End stage renal disease: Secondary | ICD-10-CM | POA: Diagnosis not present

## 2016-08-19 DIAGNOSIS — K7689 Other specified diseases of liver: Secondary | ICD-10-CM | POA: Diagnosis not present

## 2016-08-19 DIAGNOSIS — D509 Iron deficiency anemia, unspecified: Secondary | ICD-10-CM | POA: Diagnosis not present

## 2016-08-22 DIAGNOSIS — K7689 Other specified diseases of liver: Secondary | ICD-10-CM | POA: Diagnosis not present

## 2016-08-22 DIAGNOSIS — N186 End stage renal disease: Secondary | ICD-10-CM | POA: Diagnosis not present

## 2016-08-22 DIAGNOSIS — N2581 Secondary hyperparathyroidism of renal origin: Secondary | ICD-10-CM | POA: Diagnosis not present

## 2016-08-22 DIAGNOSIS — E119 Type 2 diabetes mellitus without complications: Secondary | ICD-10-CM | POA: Diagnosis not present

## 2016-08-22 DIAGNOSIS — D509 Iron deficiency anemia, unspecified: Secondary | ICD-10-CM | POA: Diagnosis not present

## 2016-08-23 ENCOUNTER — Telehealth: Payer: Self-pay | Admitting: Cardiology

## 2016-08-23 NOTE — Telephone Encounter (Signed)
The patient is to be scheduled for a follow up appt.

## 2016-08-23 NOTE — Telephone Encounter (Signed)
New message       Calling to report abn ekg reading.

## 2016-08-23 NOTE — Telephone Encounter (Signed)
Received call from [employer]-patient had episode of bradycardia on 5/18 at 7:37 PM HR 29.   Called and spoke to patient wife (ok per DPR)-reports patient has been asymptomatic, denies dizziness, lightheadedness, syncope, palpitations.   Reports at the time of episode he was in his recliner watching [employer].     Report printed and Dr. Percival Spanish who is in the office reviewed.  No orders at this time.    24 hour holter placed on 5/17 for bradycardia.

## 2016-08-23 NOTE — Telephone Encounter (Signed)
Pt have appt tomorrow @ 2:00 with Almyra Deforest, PA

## 2016-08-24 ENCOUNTER — Ambulatory Visit (INDEPENDENT_AMBULATORY_CARE_PROVIDER_SITE_OTHER): Payer: Medicare Other | Admitting: Physician Assistant

## 2016-08-24 ENCOUNTER — Encounter: Payer: Self-pay | Admitting: Physician Assistant

## 2016-08-24 VITALS — BP 126/66 | HR 46 | Ht 76.0 in | Wt 198.8 lb

## 2016-08-24 DIAGNOSIS — D509 Iron deficiency anemia, unspecified: Secondary | ICD-10-CM | POA: Diagnosis not present

## 2016-08-24 DIAGNOSIS — I25810 Atherosclerosis of coronary artery bypass graft(s) without angina pectoris: Secondary | ICD-10-CM

## 2016-08-24 DIAGNOSIS — Z79899 Other long term (current) drug therapy: Secondary | ICD-10-CM | POA: Diagnosis not present

## 2016-08-24 DIAGNOSIS — H547 Unspecified visual loss: Secondary | ICD-10-CM | POA: Diagnosis not present

## 2016-08-24 DIAGNOSIS — R001 Bradycardia, unspecified: Secondary | ICD-10-CM | POA: Diagnosis not present

## 2016-08-24 DIAGNOSIS — N186 End stage renal disease: Secondary | ICD-10-CM | POA: Diagnosis not present

## 2016-08-24 DIAGNOSIS — E785 Hyperlipidemia, unspecified: Secondary | ICD-10-CM

## 2016-08-24 DIAGNOSIS — E119 Type 2 diabetes mellitus without complications: Secondary | ICD-10-CM | POA: Diagnosis not present

## 2016-08-24 DIAGNOSIS — N2581 Secondary hyperparathyroidism of renal origin: Secondary | ICD-10-CM | POA: Diagnosis not present

## 2016-08-24 DIAGNOSIS — Z992 Dependence on renal dialysis: Secondary | ICD-10-CM | POA: Diagnosis not present

## 2016-08-24 DIAGNOSIS — I10 Essential (primary) hypertension: Secondary | ICD-10-CM

## 2016-08-24 DIAGNOSIS — K7689 Other specified diseases of liver: Secondary | ICD-10-CM | POA: Diagnosis not present

## 2016-08-24 NOTE — Progress Notes (Signed)
Cardiology Office Note    Date:  08/25/2016   ID:  [name], DOB 01-24-1951, MRN [id number]  PCP:  [name], MD  Cardiologist:  Dr. Percival Spanish  Chief Complaint  Patient presents with  . Follow-up    seen for Dr. Percival Spanish. Bradycardia on recent holter monitor    History of Present Illness:  [name] is a 66 y.o. male with PMH of ESRD, blindness, DM II, HTN, HLD, h/o renal cell carcinoma, h/o prostate CA and CAD s/p CABG. He had a Myoview in 2013 which was abnormal. Cardiac catheterization in the time showed significant native artery disease, he underwent CABG with LIMA to LAD and SVG to OM. He had a right nephrectomy in October 2016 for recurrent renal cancer. He is on the renal transplant list at [name]. He had a resection of retroperitoneal mass which found to be clear cell. Last echocardiogram obtained on 04/08/2016 showed subtotal optimal image quality, EF 60-65%, moderate LVH.  He was last seen by Dr. Percival Spanish on 08/09/2016 after noting to have bradycardic episode with his heart rate in the 40s and 50s during dialysis. He take Midrin for low blood pressure. He also occasionally have lightheadedness as well, however does not have any presyncope. A 24-hour Holter monitor was placed, this revealed occasional severe bradycardia down to 29.  He presents to cardiology service for evaluation of bradycardia. The episode were his heart rate went down to 29 bpm happened around 7:37 PM. According to the patient, he was awake at the time. While on the monitor, he never had any dizziness presyncope or syncope. He had no cardiac awareness of slow heart rate. He has been having some issue with nausea, he actually vomited in the office. He says his lunch was not agreeable with his stomach. But according to his wife, he has at least one vomiting episode per week and this has been going on for quite some time now. I will obtain a CMP to rule electrolyte issue. I will defer to primary care physician  for further evaluation of nausea and vomiting. As far as his bradycardia, review of the Holter monitor report does show Mobitz 2 heart block and also occasional severe sinus bradycardia. I did talk to Chanetta Marshall, our electrophysiology nurse practitioner. I discussed the case with her, this patient is a very poor candidate for pacemaker therapy given his end-stage renal disease, poor functional ability, and would be at higher risk for postoperative infection. After discussing with our EP team, the patient and his wife, we think the best option at this time is to continue observation. He is aware that he will need to let us know if he started having any chronic dizziness or presyncope, we would have low threshold to refer him to EP service if he does become symptomatic.   Past Medical History:  Diagnosis Date  . Anemia   . Arthritis    "back, neck" (07/28/2014)  . Blind    both eyes removed   . Bruises easily   . CAD, NATIVE VESSEL 01/08/2008      . Cellulitis late 1980's   "hospitalized; wrapped both legs; several times; no OR for this"  . Colon polyps   . Diabetic neuropathy ([place])   . DM type 2 (diabetes mellitus, type 2) ([place])    no medications (07/28/2014) diet and excersie controlled  . ESRD (end stage renal disease) on dialysis [facility])    [name]; Mon, Wed, Fri (07/28/2014)  . Family history  of breast cancer    mother  . Heart murmur    hx of  . History of blood product transfusion   . HYPERLIPIDEMIA-MIXED 01/08/2008  . HYPERTENSION 01/27/2009   BP low , hx of HTN, Currently on meds to raise BP  . Hypotension   . Kidney stones   . Low blood pressure   . OVERWEIGHT/OBESITY 01/08/2008   Lost 205 lbs through diet and exercise.    . Peripheral vascular disease ([place])    vein stripping  . Pneumonia 2000;s X 1  . Prostate cancer ([place])   . Prosthetic eye globe    both eyes  . Renal cell carcinoma 2001 and 2003   "both kidneys"  . Renal insufficiency     Past Surgical History:    Procedure Laterality Date  . AV FISTULA PLACEMENT Left 02/2010   LFA  . AV FISTULA PLACEMENT Left 01/12/2016   Procedure: LEFT BRACHIOCEPHALIC ARTERIOVENOUS (AV) FISTULA CREATION;  Surgeon: Angelia Mould, MD;  Location: [place];  Service: Vascular;  Laterality: Left;  . CHOLECYSTECTOMY OPEN  1992  . COLONOSCOPY  06/14/2011   Procedure: COLONOSCOPY;  Surgeon: [name], MD;  [facility];  Service: Endoscopy;  Laterality: N/A;  . COLONOSCOPY N/A 07/24/2012   Procedure: COLONOSCOPY;  Surgeon: [name], MD;  [facility];  Service: Endoscopy;  Laterality: N/A;  . COLONOSCOPY    . COLONOSCOPY N/A 11/04/2015   Procedure: COLONOSCOPY;  Surgeon: Manus Gunning, MD;  Location: [facility];  Service: Gastroenterology;  Laterality: N/A;  . COLONOSCOPY WITH PROPOFOL N/A 05/13/2014   Procedure: COLONOSCOPY WITH PROPOFOL;  Surgeon: [name], MD;  [facility];  Service: Endoscopy;  Laterality: N/A;  . CORONARY ARTERY BYPASS GRAFT  09/29/2011   Procedure: CORONARY ARTERY BYPASS GRAFTING (CABG);  Surgeon: [name], MD;  Location: [place];  Service: Open Heart Surgery;  Laterality: N/A;  Coronary Artery Bypass Graft times two utilizing the left internal mammary artery and the right greater saphenous vein harvested endoscopically.  . CYSTOSCOPY WITH RETROGRADE PYELOGRAM, URETEROSCOPY AND STENT PLACEMENT Left 07/28/2014   Procedure: CYSTOSCOPY WITH RETROGRADE PYELOGRAM, URETERAL BALLOON DILITATION, URETEROSCOPY AND LEFT STENT PLACEMENT;  Surgeon: [name], MD;  [facility];  Service: Urology;  Laterality: Left;  . ENUCLEATION  2003; 2006   bilateral; "diabetes; pain"  . FOOT AMPUTATION Right ~ 2002   right;  partial; "infection"  . FRACTURE SURGERY     hip fx  . HIP PINNING,CANNULATED Left 12/31/2013   Procedure: CANNULATED HIP PINNING;  Surgeon: Renette Butters, MD;  Location: [place];  Service: Orthopedics;  Laterality: Left;  Carm, FX  Table, [employer]  . HOT HEMOSTASIS  06/14/2011   Procedure: HOT HEMOSTASIS (ARGON PLASMA COAGULATION/BICAP);  Surgeon: [name], MD;  Location[facility];  Service: Endoscopy;  Laterality: N/A;  . INSERTION OF DIALYSIS CATHETER Right 01/12/2016   Procedure: INSERTION OF DIALYSIS CATHETER;  Surgeon: Angelia Mould, MD;  Location: [place];  Service: Vascular;  Laterality: Right;  . INSERTION PROSTATE RADIATION SEED  03/2012  . IR GENERIC HISTORICAL  01/13/2016   IR RADIOLOGIST EVAL & MGMT 01/13/2016 Aletta Edouard, MD [facility] INTERV RAD  . LAPAROTOMY N/A 04/07/2016   Procedure: EXPLORATORY LAPAROTOMY AND RESECTION OF RETROPERITONEAL MASS;  Surgeon: Raynelle Bring, MD;  [facility];  Service: Urology;  Laterality: N/A;  . LEFT HEART CATHETERIZATION WITH CORONARY ANGIOGRAM N/A 09/27/2011   Procedure: LEFT HEART CATHETERIZATION WITH CORONARY ANGIOGRAM;  Surgeon: [name], MD;  Location: [facility];  Service: Cardiovascular;  Laterality: N/A;  . LIGATION OF ARTERIOVENOUS  FISTULA Left 01/12/2016   Procedure: LIGATION OF LEFT RADIOCEPHALIC ARTERIOVENOUS  FISTULA;  Surgeon: Angelia Mould, MD;  Location: [place];  Service: Vascular;  Laterality: Left;  . LIGATION OF COMPETING BRANCHES OF ARTERIOVENOUS FISTULA Left 07/29/2014   Procedure: LIGATION OF COMPETING BRANCHES OF LEFT ARM ARTERIOVENOUS FISTULA;  Surgeon: [name], MD;  Location: [place];  Service: Vascular;  Laterality: Left;  . NEPHRECTOMY Right 01/30/2015   done at [employer]  . PARTIAL COLECTOMY Right 04/07/2016   Procedure: RIGHT COLECTOMY AND PARTIAL LIVER RESECTION;  Surgeon: Raynelle Bring, MD;  [facility];  Service: Urology;  Laterality: Right;  . RADIOFREQUENCY ABLATION KIDNEY  2010-2012   "twice; one on each side; for cancer"  . REVISON OF ARTERIOVENOUS FISTULA Left 09/01/2015   Procedure: EXPLORATION LEFT LOWER ARM  RADIOCEPHALIC ARTERIOVENOUS FISTULA;  Surgeon: Conrad Edgerton, MD;  Location: [place];   Service: Vascular;  Laterality: Left;  [name] VARICOSE VEIN SURGERY  mid 1980's    BLE; "knees down; both legs; 2 separate times"    Current Medications: Outpatient Medications Prior to Visit  Medication Sig Dispense Refill  . aspirin EC 81 MG tablet Take 81 mg by mouth at bedtime.    [name] atorvastatin (LIPITOR) 10 MG tablet Take 10 mg by mouth daily.    . cinacalcet (SENSIPAR) 90 MG tablet Take 90 mg by mouth daily.    . ferrous sulfate 325 (65 FE) MG tablet Take 1 tablet (325 mg total) by mouth daily with breakfast. 90 tablet 3  . Methoxy PEG-Epoetin Beta (MIRCERA) 50 MCG/0.3ML SOSY Inject 225 mcg as directed every 14 (fourteen) days.     . midodrine (PROAMATINE) 10 MG tablet Take 1 tablet (10 mg total) by mouth 3 (three) times daily with meals. 90 tablet 0  . multivitamin (RENA-VIT) TABS tablet Take 1 tablet by mouth at bedtime.    . Probiotic Product (ALIGN) 4 MG CAPS Take 1 capsule by mouth daily.    . sevelamer carbonate (RENVELA) 800 MG tablet Take 2,400-4,800 mg by mouth See admin instructions. Take 6 tablets (4800 mg) by mouth 3 times daily with meals and 3 tablets (2400 mg) 2 times daily with snacks     Facility-Administered Medications Prior to Visit  Medication Dose Route Frequency Provider Last Rate Last Dose  . Chlorhexidine Gluconate Cloth 2 % PADS 6 each  6 each Topical Once [name], MD         Allergies:   Codeine and Tape   Social History   Social History  . Marital status: Married    Spouse name: N/A  . Number of children: N/A  . Years of education: N/A   Social History Main Topics  . Smoking status: Never Smoker  . Smokeless tobacco: Never Used  . Alcohol use No  . Drug use: No  . Sexual activity: No   Other Topics Concern  . None   Social History Narrative  . None     Family History:  The patient's family history includes Cancer in his brother and sister; Cancer (age of onset: 2) in his father; Cancer (age of onset: 34) in his mother; Emphysema  in his father; Heart attack in his brother; Heart disease in his brother, father, and mother; Hyperlipidemia in his brother, father, and mother; Hypertension in his brother, father, and mother; Peripheral vascular  disease in his brother; Varicose Veins in his mother.   ROS:   Please see the history of present illness.    ROS All other systems reviewed and are negative.   PHYSICAL EXAM:   VS:  BP 126/66   Pulse (!) 46   Ht 6\' 4"  (1.93 m)   Wt 198 lb 12.8 oz (90.2 kg)   BMI 24.20 kg/m    GEN: Vomited in office. Otherwise, no acute stress HEENT: blind Neck: no JVD, carotid bruits, or masses Cardiac: RRR; no murmurs, rubs, or gallops,no edema  Respiratory:  clear to auscultation bilaterally, normal work of breathing GI: soft, nontender, nondistended, + BS MS: no deformity or atrophy  Skin: warm and dry, no rash Neuro:  Alert and Oriented x 3, Strength and sensation are intact Psych: euthymic mood, full affect  Wt Readings from Last 3 Encounters:  08/24/16 198 lb 12.8 oz (90.2 kg)  08/09/16 200 lb 12.8 oz (91.1 kg)  04/13/16 205 lb 14.6 oz (93.4 kg)      Studies/Labs Reviewed:   EKG:  EKG is not ordered today.    Recent Labs: 04/08/2016: ALT 67 04/11/2016: Magnesium 2.5 04/12/2016: BUN 23; Creatinine, Ser 4.56; Hemoglobin 8.0; Platelets 91; Potassium 4.4; Sodium 136   Lipid Panel    Component Value Date/Time   CHOL 120 09/28/2011 0515   TRIG 75 09/28/2011 0515   HDL 73 09/28/2011 0515   CHOLHDL 1.6 09/28/2011 0515   VLDL 15 09/28/2011 0515   LDLCALC 32 09/28/2011 0515    Additional studies/ records that were reviewed today include:   Echo 04/08/2016 LV EF: 60% -   65%  - Procedure narrative: Transthoracic echocardiography. Image   quality was suboptimal. The study was technically difficult, as a   result of poor acoustic windows, poor sound wave transmission,   and restricted patient mobility. - Left ventricle: The cavity size was normal. Wall thickness was    increased in a pattern of moderate LVH. Systolic function was   normal. The estimated ejection fraction was in the range of 60%   to 65%. Wall motion was normal; there were no regional wall   motion abnormalities. - Impressions: Overall very poor image quality and no definity   used.  Impressions:  - Overall very poor image quality and no definity used.   ASSESSMENT:    1. Bradycardia   2. Medication management   3. ESRD (end stage renal disease) on dialysis ([place])   4. Essential hypertension   5. Hyperlipidemia, unspecified hyperlipidemia type   6. Blindness   7. Coronary artery disease involving coronary bypass graft of native heart without angina pectoris      PLAN:  In order of problems listed above:  1. Asymptomatic bradycardia: Recent Holter monitor did reveal episode of slow sinus bradycardia down to 29, and also Mobitz type II heart block. The episode of slow bradycardia occurred around 7:37 PM, patient says he is likely awake at that time. However he did not feel anything nor did he have any dizziness or presyncope. I did discuss the case with our electrophysiology nurse practitioner Chanetta Marshall, given his history of end-stage renal disease and poor mobility, he would be at very high risk for postoperative infection. After discussing with the EP team and family member and also the patient, we formed an agreement to continue observation at this time and would only consider EP evaluation if he does become symptomatic. He is currently not on any AV nodal blocking agent. CMP  to make sure electrolyte stable.   2. ESRD: followed by Dr. Jimmy Footman. HD MWF. Based on vital sign log brought by his wife, Gershon Mussel at time his heart rate stays in the 50s during dialysis, only occasional dip down to the 30s.  3. CAD s/p CABG: No obvious angina. No lower extremity edema to suggest heart failure.  4. HLD: continue lipitor.     Medication Adjustments/Labs and Tests Ordered: Current  medicines are reviewed at length with the patient today.  Concerns regarding medicines are outlined above.  Medication changes, Labs and Tests ordered today are listed in the Patient Instructions below. Patient Instructions  Medication Instructions:  NO CHANGES  If you need a refill on your cardiac medications before your next appointment, please call your pharmacy.  Labwork: CMP-OK TO DO AT DIALYSIS OUR FAX NUMBER FOR RESULTS IS [phone] ATTN: Alisse Tuite, PA-V  Follow-Up: Your physician wants you to follow-up in: 2 MONTHS WITH DR [facility].   ORDERS: MONITOR HEART RATE AND SYMPTOMS OF DIZZINESS AND PASSING OUT CALL us IMMEDIATELY  Thank you for choosing [facility] at [employer], [place]  08/25/2016 12:22 AM    [facility] [address], [place], [place]  [address] Phone: [phone]; Fax: [phone]

## 2016-08-24 NOTE — Patient Instructions (Addendum)
Medication Instructions:  NO CHANGES  If you need a refill on your cardiac medications before your next appointment, please call your pharmacy.  Labwork: CMP-OK TO DO AT DIALYSIS OUR FAX NUMBER FOR RESULTS IS [phone] ATTN: HAO MENG, PA-V  Follow-Up: Your physician wants you to follow-up in: 2 MONTHS WITH DR [facility].   ORDERS: MONITOR HEART RATE AND SYMPTOMS OF DIZZINESS AND PASSING OUT CALL us IMMEDIATELY  Thank you for choosing [facility] at [facility]!!

## 2016-08-25 ENCOUNTER — Encounter: Payer: Self-pay | Admitting: Physician Assistant

## 2016-08-26 DIAGNOSIS — D509 Iron deficiency anemia, unspecified: Secondary | ICD-10-CM | POA: Diagnosis not present

## 2016-08-26 DIAGNOSIS — E119 Type 2 diabetes mellitus without complications: Secondary | ICD-10-CM | POA: Diagnosis not present

## 2016-08-26 DIAGNOSIS — K7689 Other specified diseases of liver: Secondary | ICD-10-CM | POA: Diagnosis not present

## 2016-08-26 DIAGNOSIS — N186 End stage renal disease: Secondary | ICD-10-CM | POA: Diagnosis not present

## 2016-08-26 DIAGNOSIS — N2581 Secondary hyperparathyroidism of renal origin: Secondary | ICD-10-CM | POA: Diagnosis not present

## 2016-08-29 DIAGNOSIS — N186 End stage renal disease: Secondary | ICD-10-CM | POA: Diagnosis not present

## 2016-08-29 DIAGNOSIS — K7689 Other specified diseases of liver: Secondary | ICD-10-CM | POA: Diagnosis not present

## 2016-08-29 DIAGNOSIS — D509 Iron deficiency anemia, unspecified: Secondary | ICD-10-CM | POA: Diagnosis not present

## 2016-08-29 DIAGNOSIS — E119 Type 2 diabetes mellitus without complications: Secondary | ICD-10-CM | POA: Diagnosis not present

## 2016-08-29 DIAGNOSIS — N2581 Secondary hyperparathyroidism of renal origin: Secondary | ICD-10-CM | POA: Diagnosis not present

## 2016-08-31 DIAGNOSIS — N186 End stage renal disease: Secondary | ICD-10-CM | POA: Diagnosis not present

## 2016-08-31 DIAGNOSIS — K7689 Other specified diseases of liver: Secondary | ICD-10-CM | POA: Diagnosis not present

## 2016-08-31 DIAGNOSIS — E119 Type 2 diabetes mellitus without complications: Secondary | ICD-10-CM | POA: Diagnosis not present

## 2016-08-31 DIAGNOSIS — D509 Iron deficiency anemia, unspecified: Secondary | ICD-10-CM | POA: Diagnosis not present

## 2016-08-31 DIAGNOSIS — N2581 Secondary hyperparathyroidism of renal origin: Secondary | ICD-10-CM | POA: Diagnosis not present

## 2016-09-01 DIAGNOSIS — Z992 Dependence on renal dialysis: Secondary | ICD-10-CM | POA: Diagnosis not present

## 2016-09-01 DIAGNOSIS — E1129 Type 2 diabetes mellitus with other diabetic kidney complication: Secondary | ICD-10-CM | POA: Diagnosis not present

## 2016-09-01 DIAGNOSIS — N186 End stage renal disease: Secondary | ICD-10-CM | POA: Diagnosis not present

## 2016-09-02 ENCOUNTER — Ambulatory Visit: Payer: Medicare Other | Admitting: Podiatry

## 2016-09-02 DIAGNOSIS — D509 Iron deficiency anemia, unspecified: Secondary | ICD-10-CM | POA: Diagnosis not present

## 2016-09-02 DIAGNOSIS — D631 Anemia in chronic kidney disease: Secondary | ICD-10-CM | POA: Diagnosis not present

## 2016-09-02 DIAGNOSIS — N2581 Secondary hyperparathyroidism of renal origin: Secondary | ICD-10-CM | POA: Diagnosis not present

## 2016-09-02 DIAGNOSIS — E119 Type 2 diabetes mellitus without complications: Secondary | ICD-10-CM | POA: Diagnosis not present

## 2016-09-02 DIAGNOSIS — K7689 Other specified diseases of liver: Secondary | ICD-10-CM | POA: Diagnosis not present

## 2016-09-02 DIAGNOSIS — N186 End stage renal disease: Secondary | ICD-10-CM | POA: Diagnosis not present

## 2016-09-05 DIAGNOSIS — E119 Type 2 diabetes mellitus without complications: Secondary | ICD-10-CM | POA: Diagnosis not present

## 2016-09-05 DIAGNOSIS — N2581 Secondary hyperparathyroidism of renal origin: Secondary | ICD-10-CM | POA: Diagnosis not present

## 2016-09-05 DIAGNOSIS — D509 Iron deficiency anemia, unspecified: Secondary | ICD-10-CM | POA: Diagnosis not present

## 2016-09-05 DIAGNOSIS — N186 End stage renal disease: Secondary | ICD-10-CM | POA: Diagnosis not present

## 2016-09-05 DIAGNOSIS — D631 Anemia in chronic kidney disease: Secondary | ICD-10-CM | POA: Diagnosis not present

## 2016-09-05 DIAGNOSIS — K7689 Other specified diseases of liver: Secondary | ICD-10-CM | POA: Diagnosis not present

## 2016-09-05 NOTE — Addendum Note (Signed)
Addendum  created 09/05/16 0949 by Myrtie Soman, MD   Sign clinical note

## 2016-09-07 DIAGNOSIS — D631 Anemia in chronic kidney disease: Secondary | ICD-10-CM | POA: Diagnosis not present

## 2016-09-07 DIAGNOSIS — N186 End stage renal disease: Secondary | ICD-10-CM | POA: Diagnosis not present

## 2016-09-07 DIAGNOSIS — K7689 Other specified diseases of liver: Secondary | ICD-10-CM | POA: Diagnosis not present

## 2016-09-07 DIAGNOSIS — D509 Iron deficiency anemia, unspecified: Secondary | ICD-10-CM | POA: Diagnosis not present

## 2016-09-07 DIAGNOSIS — E119 Type 2 diabetes mellitus without complications: Secondary | ICD-10-CM | POA: Diagnosis not present

## 2016-09-07 DIAGNOSIS — N2581 Secondary hyperparathyroidism of renal origin: Secondary | ICD-10-CM | POA: Diagnosis not present

## 2016-09-09 DIAGNOSIS — D631 Anemia in chronic kidney disease: Secondary | ICD-10-CM | POA: Diagnosis not present

## 2016-09-09 DIAGNOSIS — N186 End stage renal disease: Secondary | ICD-10-CM | POA: Diagnosis not present

## 2016-09-09 DIAGNOSIS — N2581 Secondary hyperparathyroidism of renal origin: Secondary | ICD-10-CM | POA: Diagnosis not present

## 2016-09-09 DIAGNOSIS — D509 Iron deficiency anemia, unspecified: Secondary | ICD-10-CM | POA: Diagnosis not present

## 2016-09-09 DIAGNOSIS — K7689 Other specified diseases of liver: Secondary | ICD-10-CM | POA: Diagnosis not present

## 2016-09-09 DIAGNOSIS — E119 Type 2 diabetes mellitus without complications: Secondary | ICD-10-CM | POA: Diagnosis not present

## 2016-09-12 DIAGNOSIS — N186 End stage renal disease: Secondary | ICD-10-CM | POA: Diagnosis not present

## 2016-09-12 DIAGNOSIS — D509 Iron deficiency anemia, unspecified: Secondary | ICD-10-CM | POA: Diagnosis not present

## 2016-09-12 DIAGNOSIS — D631 Anemia in chronic kidney disease: Secondary | ICD-10-CM | POA: Diagnosis not present

## 2016-09-12 DIAGNOSIS — K7689 Other specified diseases of liver: Secondary | ICD-10-CM | POA: Diagnosis not present

## 2016-09-12 DIAGNOSIS — E119 Type 2 diabetes mellitus without complications: Secondary | ICD-10-CM | POA: Diagnosis not present

## 2016-09-12 DIAGNOSIS — N2581 Secondary hyperparathyroidism of renal origin: Secondary | ICD-10-CM | POA: Diagnosis not present

## 2016-09-14 DIAGNOSIS — E119 Type 2 diabetes mellitus without complications: Secondary | ICD-10-CM | POA: Diagnosis not present

## 2016-09-14 DIAGNOSIS — D509 Iron deficiency anemia, unspecified: Secondary | ICD-10-CM | POA: Diagnosis not present

## 2016-09-14 DIAGNOSIS — K7689 Other specified diseases of liver: Secondary | ICD-10-CM | POA: Diagnosis not present

## 2016-09-14 DIAGNOSIS — N186 End stage renal disease: Secondary | ICD-10-CM | POA: Diagnosis not present

## 2016-09-14 DIAGNOSIS — D631 Anemia in chronic kidney disease: Secondary | ICD-10-CM | POA: Diagnosis not present

## 2016-09-14 DIAGNOSIS — N2581 Secondary hyperparathyroidism of renal origin: Secondary | ICD-10-CM | POA: Diagnosis not present

## 2016-09-16 DIAGNOSIS — D631 Anemia in chronic kidney disease: Secondary | ICD-10-CM | POA: Diagnosis not present

## 2016-09-16 DIAGNOSIS — N2581 Secondary hyperparathyroidism of renal origin: Secondary | ICD-10-CM | POA: Diagnosis not present

## 2016-09-16 DIAGNOSIS — N186 End stage renal disease: Secondary | ICD-10-CM | POA: Diagnosis not present

## 2016-09-16 DIAGNOSIS — D509 Iron deficiency anemia, unspecified: Secondary | ICD-10-CM | POA: Diagnosis not present

## 2016-09-16 DIAGNOSIS — K7689 Other specified diseases of liver: Secondary | ICD-10-CM | POA: Diagnosis not present

## 2016-09-16 DIAGNOSIS — E119 Type 2 diabetes mellitus without complications: Secondary | ICD-10-CM | POA: Diagnosis not present

## 2016-09-19 DIAGNOSIS — K7689 Other specified diseases of liver: Secondary | ICD-10-CM | POA: Diagnosis not present

## 2016-09-19 DIAGNOSIS — D631 Anemia in chronic kidney disease: Secondary | ICD-10-CM | POA: Diagnosis not present

## 2016-09-19 DIAGNOSIS — D509 Iron deficiency anemia, unspecified: Secondary | ICD-10-CM | POA: Diagnosis not present

## 2016-09-19 DIAGNOSIS — E119 Type 2 diabetes mellitus without complications: Secondary | ICD-10-CM | POA: Diagnosis not present

## 2016-09-19 DIAGNOSIS — N186 End stage renal disease: Secondary | ICD-10-CM | POA: Diagnosis not present

## 2016-09-19 DIAGNOSIS — N2581 Secondary hyperparathyroidism of renal origin: Secondary | ICD-10-CM | POA: Diagnosis not present

## 2016-09-21 DIAGNOSIS — E119 Type 2 diabetes mellitus without complications: Secondary | ICD-10-CM | POA: Diagnosis not present

## 2016-09-21 DIAGNOSIS — N2581 Secondary hyperparathyroidism of renal origin: Secondary | ICD-10-CM | POA: Diagnosis not present

## 2016-09-21 DIAGNOSIS — N186 End stage renal disease: Secondary | ICD-10-CM | POA: Diagnosis not present

## 2016-09-21 DIAGNOSIS — D631 Anemia in chronic kidney disease: Secondary | ICD-10-CM | POA: Diagnosis not present

## 2016-09-21 DIAGNOSIS — D509 Iron deficiency anemia, unspecified: Secondary | ICD-10-CM | POA: Diagnosis not present

## 2016-09-21 DIAGNOSIS — K7689 Other specified diseases of liver: Secondary | ICD-10-CM | POA: Diagnosis not present

## 2016-09-23 DIAGNOSIS — N186 End stage renal disease: Secondary | ICD-10-CM | POA: Diagnosis not present

## 2016-09-23 DIAGNOSIS — N2581 Secondary hyperparathyroidism of renal origin: Secondary | ICD-10-CM | POA: Diagnosis not present

## 2016-09-23 DIAGNOSIS — D631 Anemia in chronic kidney disease: Secondary | ICD-10-CM | POA: Diagnosis not present

## 2016-09-23 DIAGNOSIS — E119 Type 2 diabetes mellitus without complications: Secondary | ICD-10-CM | POA: Diagnosis not present

## 2016-09-23 DIAGNOSIS — K7689 Other specified diseases of liver: Secondary | ICD-10-CM | POA: Diagnosis not present

## 2016-09-23 DIAGNOSIS — D509 Iron deficiency anemia, unspecified: Secondary | ICD-10-CM | POA: Diagnosis not present

## 2016-09-26 DIAGNOSIS — E119 Type 2 diabetes mellitus without complications: Secondary | ICD-10-CM | POA: Diagnosis not present

## 2016-09-26 DIAGNOSIS — N2581 Secondary hyperparathyroidism of renal origin: Secondary | ICD-10-CM | POA: Diagnosis not present

## 2016-09-26 DIAGNOSIS — D631 Anemia in chronic kidney disease: Secondary | ICD-10-CM | POA: Diagnosis not present

## 2016-09-26 DIAGNOSIS — N186 End stage renal disease: Secondary | ICD-10-CM | POA: Diagnosis not present

## 2016-09-26 DIAGNOSIS — K7689 Other specified diseases of liver: Secondary | ICD-10-CM | POA: Diagnosis not present

## 2016-09-26 DIAGNOSIS — D509 Iron deficiency anemia, unspecified: Secondary | ICD-10-CM | POA: Diagnosis not present

## 2016-09-28 DIAGNOSIS — K7689 Other specified diseases of liver: Secondary | ICD-10-CM | POA: Diagnosis not present

## 2016-09-28 DIAGNOSIS — D509 Iron deficiency anemia, unspecified: Secondary | ICD-10-CM | POA: Diagnosis not present

## 2016-09-28 DIAGNOSIS — N186 End stage renal disease: Secondary | ICD-10-CM | POA: Diagnosis not present

## 2016-09-28 DIAGNOSIS — D631 Anemia in chronic kidney disease: Secondary | ICD-10-CM | POA: Diagnosis not present

## 2016-09-28 DIAGNOSIS — N2581 Secondary hyperparathyroidism of renal origin: Secondary | ICD-10-CM | POA: Diagnosis not present

## 2016-09-28 DIAGNOSIS — E119 Type 2 diabetes mellitus without complications: Secondary | ICD-10-CM | POA: Diagnosis not present

## 2016-09-30 DIAGNOSIS — K7689 Other specified diseases of liver: Secondary | ICD-10-CM | POA: Diagnosis not present

## 2016-09-30 DIAGNOSIS — D509 Iron deficiency anemia, unspecified: Secondary | ICD-10-CM | POA: Diagnosis not present

## 2016-09-30 DIAGNOSIS — E119 Type 2 diabetes mellitus without complications: Secondary | ICD-10-CM | POA: Diagnosis not present

## 2016-09-30 DIAGNOSIS — N186 End stage renal disease: Secondary | ICD-10-CM | POA: Diagnosis not present

## 2016-09-30 DIAGNOSIS — D631 Anemia in chronic kidney disease: Secondary | ICD-10-CM | POA: Diagnosis not present

## 2016-09-30 DIAGNOSIS — N2581 Secondary hyperparathyroidism of renal origin: Secondary | ICD-10-CM | POA: Diagnosis not present

## 2016-10-01 DIAGNOSIS — Z992 Dependence on renal dialysis: Secondary | ICD-10-CM | POA: Diagnosis not present

## 2016-10-01 DIAGNOSIS — N186 End stage renal disease: Secondary | ICD-10-CM | POA: Diagnosis not present

## 2016-10-01 DIAGNOSIS — E1129 Type 2 diabetes mellitus with other diabetic kidney complication: Secondary | ICD-10-CM | POA: Diagnosis not present

## 2016-10-03 DIAGNOSIS — D509 Iron deficiency anemia, unspecified: Secondary | ICD-10-CM | POA: Diagnosis not present

## 2016-10-03 DIAGNOSIS — D631 Anemia in chronic kidney disease: Secondary | ICD-10-CM | POA: Diagnosis not present

## 2016-10-03 DIAGNOSIS — K7689 Other specified diseases of liver: Secondary | ICD-10-CM | POA: Diagnosis not present

## 2016-10-03 DIAGNOSIS — N2581 Secondary hyperparathyroidism of renal origin: Secondary | ICD-10-CM | POA: Diagnosis not present

## 2016-10-03 DIAGNOSIS — E119 Type 2 diabetes mellitus without complications: Secondary | ICD-10-CM | POA: Diagnosis not present

## 2016-10-03 DIAGNOSIS — N186 End stage renal disease: Secondary | ICD-10-CM | POA: Diagnosis not present

## 2016-10-05 DIAGNOSIS — D509 Iron deficiency anemia, unspecified: Secondary | ICD-10-CM | POA: Diagnosis not present

## 2016-10-05 DIAGNOSIS — E119 Type 2 diabetes mellitus without complications: Secondary | ICD-10-CM | POA: Diagnosis not present

## 2016-10-05 DIAGNOSIS — D631 Anemia in chronic kidney disease: Secondary | ICD-10-CM | POA: Diagnosis not present

## 2016-10-05 DIAGNOSIS — N2581 Secondary hyperparathyroidism of renal origin: Secondary | ICD-10-CM | POA: Diagnosis not present

## 2016-10-05 DIAGNOSIS — K7689 Other specified diseases of liver: Secondary | ICD-10-CM | POA: Diagnosis not present

## 2016-10-05 DIAGNOSIS — N186 End stage renal disease: Secondary | ICD-10-CM | POA: Diagnosis not present

## 2016-10-07 DIAGNOSIS — D509 Iron deficiency anemia, unspecified: Secondary | ICD-10-CM | POA: Diagnosis not present

## 2016-10-07 DIAGNOSIS — K7689 Other specified diseases of liver: Secondary | ICD-10-CM | POA: Diagnosis not present

## 2016-10-07 DIAGNOSIS — E119 Type 2 diabetes mellitus without complications: Secondary | ICD-10-CM | POA: Diagnosis not present

## 2016-10-07 DIAGNOSIS — D631 Anemia in chronic kidney disease: Secondary | ICD-10-CM | POA: Diagnosis not present

## 2016-10-07 DIAGNOSIS — N2581 Secondary hyperparathyroidism of renal origin: Secondary | ICD-10-CM | POA: Diagnosis not present

## 2016-10-07 DIAGNOSIS — N186 End stage renal disease: Secondary | ICD-10-CM | POA: Diagnosis not present

## 2016-10-10 DIAGNOSIS — N2581 Secondary hyperparathyroidism of renal origin: Secondary | ICD-10-CM | POA: Diagnosis not present

## 2016-10-10 DIAGNOSIS — D509 Iron deficiency anemia, unspecified: Secondary | ICD-10-CM | POA: Diagnosis not present

## 2016-10-10 DIAGNOSIS — K7689 Other specified diseases of liver: Secondary | ICD-10-CM | POA: Diagnosis not present

## 2016-10-10 DIAGNOSIS — D631 Anemia in chronic kidney disease: Secondary | ICD-10-CM | POA: Diagnosis not present

## 2016-10-10 DIAGNOSIS — E119 Type 2 diabetes mellitus without complications: Secondary | ICD-10-CM | POA: Diagnosis not present

## 2016-10-10 DIAGNOSIS — N186 End stage renal disease: Secondary | ICD-10-CM | POA: Diagnosis not present

## 2016-10-12 DIAGNOSIS — E1129 Type 2 diabetes mellitus with other diabetic kidney complication: Secondary | ICD-10-CM | POA: Diagnosis not present

## 2016-10-12 DIAGNOSIS — N186 End stage renal disease: Secondary | ICD-10-CM | POA: Diagnosis not present

## 2016-10-12 DIAGNOSIS — D631 Anemia in chronic kidney disease: Secondary | ICD-10-CM | POA: Diagnosis not present

## 2016-10-12 DIAGNOSIS — E119 Type 2 diabetes mellitus without complications: Secondary | ICD-10-CM | POA: Diagnosis not present

## 2016-10-12 DIAGNOSIS — D509 Iron deficiency anemia, unspecified: Secondary | ICD-10-CM | POA: Diagnosis not present

## 2016-10-12 DIAGNOSIS — N2581 Secondary hyperparathyroidism of renal origin: Secondary | ICD-10-CM | POA: Diagnosis not present

## 2016-10-12 DIAGNOSIS — K7689 Other specified diseases of liver: Secondary | ICD-10-CM | POA: Diagnosis not present

## 2016-10-14 DIAGNOSIS — E119 Type 2 diabetes mellitus without complications: Secondary | ICD-10-CM | POA: Diagnosis not present

## 2016-10-14 DIAGNOSIS — K7689 Other specified diseases of liver: Secondary | ICD-10-CM | POA: Diagnosis not present

## 2016-10-14 DIAGNOSIS — D631 Anemia in chronic kidney disease: Secondary | ICD-10-CM | POA: Diagnosis not present

## 2016-10-14 DIAGNOSIS — D509 Iron deficiency anemia, unspecified: Secondary | ICD-10-CM | POA: Diagnosis not present

## 2016-10-14 DIAGNOSIS — N2581 Secondary hyperparathyroidism of renal origin: Secondary | ICD-10-CM | POA: Diagnosis not present

## 2016-10-14 DIAGNOSIS — N186 End stage renal disease: Secondary | ICD-10-CM | POA: Diagnosis not present

## 2016-10-17 DIAGNOSIS — N2581 Secondary hyperparathyroidism of renal origin: Secondary | ICD-10-CM | POA: Diagnosis not present

## 2016-10-17 DIAGNOSIS — D509 Iron deficiency anemia, unspecified: Secondary | ICD-10-CM | POA: Diagnosis not present

## 2016-10-17 DIAGNOSIS — K7689 Other specified diseases of liver: Secondary | ICD-10-CM | POA: Diagnosis not present

## 2016-10-17 DIAGNOSIS — N186 End stage renal disease: Secondary | ICD-10-CM | POA: Diagnosis not present

## 2016-10-17 DIAGNOSIS — D631 Anemia in chronic kidney disease: Secondary | ICD-10-CM | POA: Diagnosis not present

## 2016-10-17 DIAGNOSIS — E119 Type 2 diabetes mellitus without complications: Secondary | ICD-10-CM | POA: Diagnosis not present

## 2016-10-19 DIAGNOSIS — D509 Iron deficiency anemia, unspecified: Secondary | ICD-10-CM | POA: Diagnosis not present

## 2016-10-19 DIAGNOSIS — N2581 Secondary hyperparathyroidism of renal origin: Secondary | ICD-10-CM | POA: Diagnosis not present

## 2016-10-19 DIAGNOSIS — E119 Type 2 diabetes mellitus without complications: Secondary | ICD-10-CM | POA: Diagnosis not present

## 2016-10-19 DIAGNOSIS — K7689 Other specified diseases of liver: Secondary | ICD-10-CM | POA: Diagnosis not present

## 2016-10-19 DIAGNOSIS — D631 Anemia in chronic kidney disease: Secondary | ICD-10-CM | POA: Diagnosis not present

## 2016-10-19 DIAGNOSIS — N186 End stage renal disease: Secondary | ICD-10-CM | POA: Diagnosis not present

## 2016-10-21 DIAGNOSIS — D509 Iron deficiency anemia, unspecified: Secondary | ICD-10-CM | POA: Diagnosis not present

## 2016-10-21 DIAGNOSIS — E119 Type 2 diabetes mellitus without complications: Secondary | ICD-10-CM | POA: Diagnosis not present

## 2016-10-21 DIAGNOSIS — N2581 Secondary hyperparathyroidism of renal origin: Secondary | ICD-10-CM | POA: Diagnosis not present

## 2016-10-21 DIAGNOSIS — D631 Anemia in chronic kidney disease: Secondary | ICD-10-CM | POA: Diagnosis not present

## 2016-10-21 DIAGNOSIS — N186 End stage renal disease: Secondary | ICD-10-CM | POA: Diagnosis not present

## 2016-10-21 DIAGNOSIS — K7689 Other specified diseases of liver: Secondary | ICD-10-CM | POA: Diagnosis not present

## 2016-10-24 DIAGNOSIS — D631 Anemia in chronic kidney disease: Secondary | ICD-10-CM | POA: Diagnosis not present

## 2016-10-24 DIAGNOSIS — E119 Type 2 diabetes mellitus without complications: Secondary | ICD-10-CM | POA: Diagnosis not present

## 2016-10-24 DIAGNOSIS — N186 End stage renal disease: Secondary | ICD-10-CM | POA: Diagnosis not present

## 2016-10-24 DIAGNOSIS — N2581 Secondary hyperparathyroidism of renal origin: Secondary | ICD-10-CM | POA: Diagnosis not present

## 2016-10-24 DIAGNOSIS — K7689 Other specified diseases of liver: Secondary | ICD-10-CM | POA: Diagnosis not present

## 2016-10-24 DIAGNOSIS — D509 Iron deficiency anemia, unspecified: Secondary | ICD-10-CM | POA: Diagnosis not present

## 2016-10-26 DIAGNOSIS — K7689 Other specified diseases of liver: Secondary | ICD-10-CM | POA: Diagnosis not present

## 2016-10-26 DIAGNOSIS — N2581 Secondary hyperparathyroidism of renal origin: Secondary | ICD-10-CM | POA: Diagnosis not present

## 2016-10-26 DIAGNOSIS — D509 Iron deficiency anemia, unspecified: Secondary | ICD-10-CM | POA: Diagnosis not present

## 2016-10-26 DIAGNOSIS — D631 Anemia in chronic kidney disease: Secondary | ICD-10-CM | POA: Diagnosis not present

## 2016-10-26 DIAGNOSIS — N186 End stage renal disease: Secondary | ICD-10-CM | POA: Diagnosis not present

## 2016-10-26 DIAGNOSIS — E119 Type 2 diabetes mellitus without complications: Secondary | ICD-10-CM | POA: Diagnosis not present

## 2016-10-28 DIAGNOSIS — E119 Type 2 diabetes mellitus without complications: Secondary | ICD-10-CM | POA: Diagnosis not present

## 2016-10-28 DIAGNOSIS — D631 Anemia in chronic kidney disease: Secondary | ICD-10-CM | POA: Diagnosis not present

## 2016-10-28 DIAGNOSIS — D509 Iron deficiency anemia, unspecified: Secondary | ICD-10-CM | POA: Diagnosis not present

## 2016-10-28 DIAGNOSIS — K7689 Other specified diseases of liver: Secondary | ICD-10-CM | POA: Diagnosis not present

## 2016-10-28 DIAGNOSIS — N2581 Secondary hyperparathyroidism of renal origin: Secondary | ICD-10-CM | POA: Diagnosis not present

## 2016-10-28 DIAGNOSIS — N186 End stage renal disease: Secondary | ICD-10-CM | POA: Diagnosis not present

## 2016-10-31 DIAGNOSIS — D509 Iron deficiency anemia, unspecified: Secondary | ICD-10-CM | POA: Diagnosis not present

## 2016-10-31 DIAGNOSIS — K7689 Other specified diseases of liver: Secondary | ICD-10-CM | POA: Diagnosis not present

## 2016-10-31 DIAGNOSIS — D631 Anemia in chronic kidney disease: Secondary | ICD-10-CM | POA: Diagnosis not present

## 2016-10-31 DIAGNOSIS — N186 End stage renal disease: Secondary | ICD-10-CM | POA: Diagnosis not present

## 2016-10-31 DIAGNOSIS — N2581 Secondary hyperparathyroidism of renal origin: Secondary | ICD-10-CM | POA: Diagnosis not present

## 2016-10-31 DIAGNOSIS — E119 Type 2 diabetes mellitus without complications: Secondary | ICD-10-CM | POA: Diagnosis not present

## 2016-11-01 DIAGNOSIS — N186 End stage renal disease: Secondary | ICD-10-CM | POA: Diagnosis not present

## 2016-11-01 DIAGNOSIS — E1129 Type 2 diabetes mellitus with other diabetic kidney complication: Secondary | ICD-10-CM | POA: Diagnosis not present

## 2016-11-01 DIAGNOSIS — Z992 Dependence on renal dialysis: Secondary | ICD-10-CM | POA: Diagnosis not present

## 2016-11-02 DIAGNOSIS — D509 Iron deficiency anemia, unspecified: Secondary | ICD-10-CM | POA: Diagnosis not present

## 2016-11-02 DIAGNOSIS — E119 Type 2 diabetes mellitus without complications: Secondary | ICD-10-CM | POA: Diagnosis not present

## 2016-11-02 DIAGNOSIS — N2581 Secondary hyperparathyroidism of renal origin: Secondary | ICD-10-CM | POA: Diagnosis not present

## 2016-11-02 DIAGNOSIS — K7689 Other specified diseases of liver: Secondary | ICD-10-CM | POA: Diagnosis not present

## 2016-11-02 DIAGNOSIS — N186 End stage renal disease: Secondary | ICD-10-CM | POA: Diagnosis not present

## 2016-11-02 DIAGNOSIS — D631 Anemia in chronic kidney disease: Secondary | ICD-10-CM | POA: Diagnosis not present

## 2016-11-04 DIAGNOSIS — D509 Iron deficiency anemia, unspecified: Secondary | ICD-10-CM | POA: Diagnosis not present

## 2016-11-04 DIAGNOSIS — D631 Anemia in chronic kidney disease: Secondary | ICD-10-CM | POA: Diagnosis not present

## 2016-11-04 DIAGNOSIS — K7689 Other specified diseases of liver: Secondary | ICD-10-CM | POA: Diagnosis not present

## 2016-11-04 DIAGNOSIS — N2581 Secondary hyperparathyroidism of renal origin: Secondary | ICD-10-CM | POA: Diagnosis not present

## 2016-11-04 DIAGNOSIS — E119 Type 2 diabetes mellitus without complications: Secondary | ICD-10-CM | POA: Diagnosis not present

## 2016-11-04 DIAGNOSIS — N186 End stage renal disease: Secondary | ICD-10-CM | POA: Diagnosis not present

## 2016-11-07 DIAGNOSIS — D509 Iron deficiency anemia, unspecified: Secondary | ICD-10-CM | POA: Diagnosis not present

## 2016-11-07 DIAGNOSIS — K7689 Other specified diseases of liver: Secondary | ICD-10-CM | POA: Diagnosis not present

## 2016-11-07 DIAGNOSIS — N2581 Secondary hyperparathyroidism of renal origin: Secondary | ICD-10-CM | POA: Diagnosis not present

## 2016-11-07 DIAGNOSIS — E119 Type 2 diabetes mellitus without complications: Secondary | ICD-10-CM | POA: Diagnosis not present

## 2016-11-07 DIAGNOSIS — D631 Anemia in chronic kidney disease: Secondary | ICD-10-CM | POA: Diagnosis not present

## 2016-11-07 DIAGNOSIS — N186 End stage renal disease: Secondary | ICD-10-CM | POA: Diagnosis not present

## 2016-11-09 DIAGNOSIS — D631 Anemia in chronic kidney disease: Secondary | ICD-10-CM | POA: Diagnosis not present

## 2016-11-09 DIAGNOSIS — N2581 Secondary hyperparathyroidism of renal origin: Secondary | ICD-10-CM | POA: Diagnosis not present

## 2016-11-09 DIAGNOSIS — K7689 Other specified diseases of liver: Secondary | ICD-10-CM | POA: Diagnosis not present

## 2016-11-09 DIAGNOSIS — E119 Type 2 diabetes mellitus without complications: Secondary | ICD-10-CM | POA: Diagnosis not present

## 2016-11-09 DIAGNOSIS — D509 Iron deficiency anemia, unspecified: Secondary | ICD-10-CM | POA: Diagnosis not present

## 2016-11-09 DIAGNOSIS — N186 End stage renal disease: Secondary | ICD-10-CM | POA: Diagnosis not present

## 2016-11-11 DIAGNOSIS — N186 End stage renal disease: Secondary | ICD-10-CM | POA: Diagnosis not present

## 2016-11-11 DIAGNOSIS — D631 Anemia in chronic kidney disease: Secondary | ICD-10-CM | POA: Diagnosis not present

## 2016-11-11 DIAGNOSIS — N2581 Secondary hyperparathyroidism of renal origin: Secondary | ICD-10-CM | POA: Diagnosis not present

## 2016-11-11 DIAGNOSIS — K7689 Other specified diseases of liver: Secondary | ICD-10-CM | POA: Diagnosis not present

## 2016-11-11 DIAGNOSIS — E119 Type 2 diabetes mellitus without complications: Secondary | ICD-10-CM | POA: Diagnosis not present

## 2016-11-11 DIAGNOSIS — D509 Iron deficiency anemia, unspecified: Secondary | ICD-10-CM | POA: Diagnosis not present

## 2016-11-14 DIAGNOSIS — D631 Anemia in chronic kidney disease: Secondary | ICD-10-CM | POA: Diagnosis not present

## 2016-11-14 DIAGNOSIS — D509 Iron deficiency anemia, unspecified: Secondary | ICD-10-CM | POA: Diagnosis not present

## 2016-11-14 DIAGNOSIS — E119 Type 2 diabetes mellitus without complications: Secondary | ICD-10-CM | POA: Diagnosis not present

## 2016-11-14 DIAGNOSIS — N186 End stage renal disease: Secondary | ICD-10-CM | POA: Diagnosis not present

## 2016-11-14 DIAGNOSIS — N2581 Secondary hyperparathyroidism of renal origin: Secondary | ICD-10-CM | POA: Diagnosis not present

## 2016-11-14 DIAGNOSIS — K7689 Other specified diseases of liver: Secondary | ICD-10-CM | POA: Diagnosis not present

## 2016-11-16 DIAGNOSIS — D509 Iron deficiency anemia, unspecified: Secondary | ICD-10-CM | POA: Diagnosis not present

## 2016-11-16 DIAGNOSIS — N2581 Secondary hyperparathyroidism of renal origin: Secondary | ICD-10-CM | POA: Diagnosis not present

## 2016-11-16 DIAGNOSIS — D631 Anemia in chronic kidney disease: Secondary | ICD-10-CM | POA: Diagnosis not present

## 2016-11-16 DIAGNOSIS — N186 End stage renal disease: Secondary | ICD-10-CM | POA: Diagnosis not present

## 2016-11-16 DIAGNOSIS — E119 Type 2 diabetes mellitus without complications: Secondary | ICD-10-CM | POA: Diagnosis not present

## 2016-11-16 DIAGNOSIS — K7689 Other specified diseases of liver: Secondary | ICD-10-CM | POA: Diagnosis not present

## 2016-11-18 DIAGNOSIS — E119 Type 2 diabetes mellitus without complications: Secondary | ICD-10-CM | POA: Diagnosis not present

## 2016-11-18 DIAGNOSIS — N2581 Secondary hyperparathyroidism of renal origin: Secondary | ICD-10-CM | POA: Diagnosis not present

## 2016-11-18 DIAGNOSIS — D631 Anemia in chronic kidney disease: Secondary | ICD-10-CM | POA: Diagnosis not present

## 2016-11-18 DIAGNOSIS — D509 Iron deficiency anemia, unspecified: Secondary | ICD-10-CM | POA: Diagnosis not present

## 2016-11-18 DIAGNOSIS — K7689 Other specified diseases of liver: Secondary | ICD-10-CM | POA: Diagnosis not present

## 2016-11-18 DIAGNOSIS — N186 End stage renal disease: Secondary | ICD-10-CM | POA: Diagnosis not present

## 2016-11-21 DIAGNOSIS — K7689 Other specified diseases of liver: Secondary | ICD-10-CM | POA: Diagnosis not present

## 2016-11-21 DIAGNOSIS — N2581 Secondary hyperparathyroidism of renal origin: Secondary | ICD-10-CM | POA: Diagnosis not present

## 2016-11-21 DIAGNOSIS — D509 Iron deficiency anemia, unspecified: Secondary | ICD-10-CM | POA: Diagnosis not present

## 2016-11-21 DIAGNOSIS — D631 Anemia in chronic kidney disease: Secondary | ICD-10-CM | POA: Diagnosis not present

## 2016-11-21 DIAGNOSIS — N186 End stage renal disease: Secondary | ICD-10-CM | POA: Diagnosis not present

## 2016-11-21 DIAGNOSIS — E119 Type 2 diabetes mellitus without complications: Secondary | ICD-10-CM | POA: Diagnosis not present

## 2016-11-23 DIAGNOSIS — N186 End stage renal disease: Secondary | ICD-10-CM | POA: Diagnosis not present

## 2016-11-23 DIAGNOSIS — N2581 Secondary hyperparathyroidism of renal origin: Secondary | ICD-10-CM | POA: Diagnosis not present

## 2016-11-23 DIAGNOSIS — K7689 Other specified diseases of liver: Secondary | ICD-10-CM | POA: Diagnosis not present

## 2016-11-23 DIAGNOSIS — D631 Anemia in chronic kidney disease: Secondary | ICD-10-CM | POA: Diagnosis not present

## 2016-11-23 DIAGNOSIS — D509 Iron deficiency anemia, unspecified: Secondary | ICD-10-CM | POA: Diagnosis not present

## 2016-11-23 DIAGNOSIS — E119 Type 2 diabetes mellitus without complications: Secondary | ICD-10-CM | POA: Diagnosis not present

## 2016-11-25 DIAGNOSIS — N2581 Secondary hyperparathyroidism of renal origin: Secondary | ICD-10-CM | POA: Diagnosis not present

## 2016-11-25 DIAGNOSIS — E119 Type 2 diabetes mellitus without complications: Secondary | ICD-10-CM | POA: Diagnosis not present

## 2016-11-25 DIAGNOSIS — N186 End stage renal disease: Secondary | ICD-10-CM | POA: Diagnosis not present

## 2016-11-25 DIAGNOSIS — D509 Iron deficiency anemia, unspecified: Secondary | ICD-10-CM | POA: Diagnosis not present

## 2016-11-25 DIAGNOSIS — K7689 Other specified diseases of liver: Secondary | ICD-10-CM | POA: Diagnosis not present

## 2016-11-25 DIAGNOSIS — D631 Anemia in chronic kidney disease: Secondary | ICD-10-CM | POA: Diagnosis not present

## 2016-11-28 DIAGNOSIS — N2581 Secondary hyperparathyroidism of renal origin: Secondary | ICD-10-CM | POA: Diagnosis not present

## 2016-11-28 DIAGNOSIS — E119 Type 2 diabetes mellitus without complications: Secondary | ICD-10-CM | POA: Diagnosis not present

## 2016-11-28 DIAGNOSIS — K7689 Other specified diseases of liver: Secondary | ICD-10-CM | POA: Diagnosis not present

## 2016-11-28 DIAGNOSIS — Z9049 Acquired absence of other specified parts of digestive tract: Secondary | ICD-10-CM | POA: Diagnosis not present

## 2016-11-28 DIAGNOSIS — D509 Iron deficiency anemia, unspecified: Secondary | ICD-10-CM | POA: Diagnosis not present

## 2016-11-28 DIAGNOSIS — H6123 Impacted cerumen, bilateral: Secondary | ICD-10-CM | POA: Diagnosis not present

## 2016-11-28 DIAGNOSIS — D631 Anemia in chronic kidney disease: Secondary | ICD-10-CM | POA: Diagnosis not present

## 2016-11-28 DIAGNOSIS — E1151 Type 2 diabetes mellitus with diabetic peripheral angiopathy without gangrene: Secondary | ICD-10-CM | POA: Diagnosis not present

## 2016-11-28 DIAGNOSIS — K5909 Other constipation: Secondary | ICD-10-CM | POA: Diagnosis not present

## 2016-11-28 DIAGNOSIS — Z6825 Body mass index (BMI) 25.0-25.9, adult: Secondary | ICD-10-CM | POA: Diagnosis not present

## 2016-11-28 DIAGNOSIS — I10 Essential (primary) hypertension: Secondary | ICD-10-CM | POA: Diagnosis not present

## 2016-11-28 DIAGNOSIS — N186 End stage renal disease: Secondary | ICD-10-CM | POA: Diagnosis not present

## 2016-11-30 DIAGNOSIS — N186 End stage renal disease: Secondary | ICD-10-CM | POA: Diagnosis not present

## 2016-11-30 DIAGNOSIS — N2581 Secondary hyperparathyroidism of renal origin: Secondary | ICD-10-CM | POA: Diagnosis not present

## 2016-11-30 DIAGNOSIS — D631 Anemia in chronic kidney disease: Secondary | ICD-10-CM | POA: Diagnosis not present

## 2016-11-30 DIAGNOSIS — K7689 Other specified diseases of liver: Secondary | ICD-10-CM | POA: Diagnosis not present

## 2016-11-30 DIAGNOSIS — E119 Type 2 diabetes mellitus without complications: Secondary | ICD-10-CM | POA: Diagnosis not present

## 2016-11-30 DIAGNOSIS — D509 Iron deficiency anemia, unspecified: Secondary | ICD-10-CM | POA: Diagnosis not present

## 2016-12-01 NOTE — Progress Notes (Signed)
Cardiology Office Note   Date:  12/03/2016   ID:  [name], DOB [birth year], MRN [id number]  PCP:  [name], MD  Cardiologist:   [name], MD   Chief Complaint  Patient presents with  . Bradycardia      History of Present Illness: [name] is a 66 y.o. male who presents for the patient has a history of coronary artery disease with diffuse diabetic disease status post bypass.  He had a Myoview in 2013 which was abnormal. Cardiac cath demonstrated significant disease and he underwent CABG with a LIMA to the LAD and SVG to OM. He had right nephrectomy in October 2016 for recurrent renal cancer. He's on the renal transplant list and had a Lexis scan Myoview at [facility] in February 2017. He did have surgery ultimately because of infection. He came back in October for preoperative clearance again and saw Kerin Ransom.  He again did not get the transplant and got his kidney removed.  He subsequently had resection of a retroperitoneal mass that was found to be clear cell.   Echo in Jan during that hospitalization was normal EF with poor windows.  I last saw him because he had bradycardia.  He wore a monitor and was noted to have HR of 29 episodically with episodic 2:1 block and sinus brady.  However, because of his severe comorbid conditions including his ESRD it was suggested that he was high risk for pacing with infection in particular.  He returns for follow up.  The patient denies any new symptoms such as chest discomfort, neck or arm discomfort. There has been no new shortness of breath, PND or orthopnea. There have been no reported palpitations, presyncope or syncope.  He is very limited with his multiple medical problems.  However, he gets to dialysis and walks with his walker without cardiac symptoms.    Past Medical History:  Diagnosis Date  . Anemia   . Arthritis    "back, neck" (07/28/2014)  . Blind    both eyes removed   . Bruises easily   . CAD, NATIVE VESSEL  01/08/2008      . Cellulitis late 1980's   "hospitalized; wrapped both legs; several times; no OR for this"  . Colon polyps   . Diabetic neuropathy ([place])   . DM type 2 (diabetes mellitus, type 2) ([place])    no medications (07/28/2014) diet and excersie controlled  . ESRD (end stage renal disease) on dialysis [facility])    [name]; Mon, Wed, Fri (07/28/2014)  . Family history of breast cancer    mother  . Heart murmur    hx of  . History of blood product transfusion   . HYPERLIPIDEMIA-MIXED 01/08/2008  . HYPERTENSION 01/27/2009   BP low , hx of HTN, Currently on meds to raise BP  . Hypotension   . Kidney stones   . Low blood pressure   . OVERWEIGHT/OBESITY 01/08/2008   Lost 205 lbs through diet and exercise.    . Peripheral vascular disease ([place])    vein stripping  . Pneumonia 2000;s X 1  . Prostate cancer ([place])   . Prosthetic eye globe    both eyes  . Renal cell carcinoma 2001 and 2003   "both kidneys"  . Renal insufficiency     Past Surgical History:  Procedure Laterality Date  . AV FISTULA PLACEMENT Left 02/2010   LFA  . AV FISTULA PLACEMENT Left 01/12/2016   Procedure: LEFT BRACHIOCEPHALIC ARTERIOVENOUS (  AV) FISTULA CREATION;  Surgeon: Angelia Mould, MD;  Location: [place];  Service: Vascular;  Laterality: Left;  . CHOLECYSTECTOMY OPEN  1992  . COLONOSCOPY  06/14/2011   Procedure: COLONOSCOPY;  Surgeon: [name], MD;  [facility];  Service: Endoscopy;  Laterality: N/A;  . COLONOSCOPY N/A 07/24/2012   Procedure: COLONOSCOPY;  Surgeon: [name], MD;  [facility];  Service: Endoscopy;  Laterality: N/A;  . COLONOSCOPY    . COLONOSCOPY N/A 11/04/2015   Procedure: COLONOSCOPY;  Surgeon: Manus Gunning, MD;  Location: [facility];  Service: Gastroenterology;  Laterality: N/A;  . COLONOSCOPY WITH PROPOFOL N/A 05/13/2014   Procedure: COLONOSCOPY WITH PROPOFOL;  Surgeon: [name], MD;  [facility];  Service: Endoscopy;   Laterality: N/A;  . CORONARY ARTERY BYPASS GRAFT  09/29/2011   Procedure: CORONARY ARTERY BYPASS GRAFTING (CABG);  Surgeon: [name], MD;  Location: [place];  Service: Open Heart Surgery;  Laterality: N/A;  Coronary Artery Bypass Graft times two utilizing the left internal mammary artery and the right greater saphenous vein harvested endoscopically.  . CYSTOSCOPY WITH RETROGRADE PYELOGRAM, URETEROSCOPY AND STENT PLACEMENT Left 07/28/2014   Procedure: CYSTOSCOPY WITH RETROGRADE PYELOGRAM, URETERAL BALLOON DILITATION, URETEROSCOPY AND LEFT STENT PLACEMENT;  Surgeon: [name], MD;  [facility];  Service: Urology;  Laterality: Left;  . ENUCLEATION  2003; 2006   bilateral; "diabetes; pain"  . FOOT AMPUTATION Right ~ 2002   right;  partial; "infection"  . FRACTURE SURGERY     hip fx  . HIP PINNING,CANNULATED Left 12/31/2013   Procedure: CANNULATED HIP PINNING;  Surgeon: Renette Butters, MD;  Location: [place];  Service: Orthopedics;  Laterality: Left;  Carm, FX Table, [employer]  . HOT HEMOSTASIS  06/14/2011   Procedure: HOT HEMOSTASIS (ARGON PLASMA COAGULATION/BICAP);  Surgeon: [name], MD;  Location[facility];  Service: Endoscopy;  Laterality: N/A;  . INSERTION OF DIALYSIS CATHETER Right 01/12/2016   Procedure: INSERTION OF DIALYSIS CATHETER;  Surgeon: Angelia Mould, MD;  Location: [place];  Service: Vascular;  Laterality: Right;  . INSERTION PROSTATE RADIATION SEED  03/2012  . IR GENERIC HISTORICAL  01/13/2016   IR RADIOLOGIST EVAL & MGMT 01/13/2016 Aletta Edouard, MD [facility] INTERV RAD  . LAPAROTOMY N/A 04/07/2016   Procedure: EXPLORATORY LAPAROTOMY AND RESECTION OF RETROPERITONEAL MASS;  Surgeon: Raynelle Bring, MD;  [facility];  Service: Urology;  Laterality: N/A;  . LEFT HEART CATHETERIZATION WITH CORONARY ANGIOGRAM N/A 09/27/2011   Procedure: LEFT HEART CATHETERIZATION WITH CORONARY ANGIOGRAM;  Surgeon: [name], MD;  Location: [facility] CATH LAB;  Service:  Cardiovascular;  Laterality: N/A;  . LIGATION OF ARTERIOVENOUS  FISTULA Left 01/12/2016   Procedure: LIGATION OF LEFT RADIOCEPHALIC ARTERIOVENOUS  FISTULA;  Surgeon: Angelia Mould, MD;  Location: [place];  Service: Vascular;  Laterality: Left;  . LIGATION OF COMPETING BRANCHES OF ARTERIOVENOUS FISTULA Left 07/29/2014   Procedure: LIGATION OF COMPETING BRANCHES OF LEFT ARM ARTERIOVENOUS FISTULA;  Surgeon: [name], MD;  Location: [place];  Service: Vascular;  Laterality: Left;  . NEPHRECTOMY Right 01/30/2015   done at [place]  . PARTIAL COLECTOMY Right 04/07/2016   Procedure: RIGHT COLECTOMY AND PARTIAL LIVER RESECTION;  Surgeon: Raynelle Bring, MD;  [facility];  Service: Urology;  Laterality: Right;  . RADIOFREQUENCY ABLATION KIDNEY  2010-2012   "twice; one on each side; for cancer"  . REVISON OF ARTERIOVENOUS FISTULA Left 09/01/2015   Procedure: EXPLORATION LEFT  LOWER ARM  RADIOCEPHALIC ARTERIOVENOUS FISTULA;  Surgeon: Conrad White Center, MD;  Location: [facility];  Service: Vascular;  Laterality: Left;  [name] VARICOSE VEIN SURGERY  mid 1980's    BLE; "knees down; both legs; 2 separate times"     Current Outpatient Prescriptions  Medication Sig Dispense Refill  . aspirin EC 81 MG tablet Take 81 mg by mouth at bedtime.    [name] atorvastatin (LIPITOR) 10 MG tablet Take 10 mg by mouth daily.    . cinacalcet (SENSIPAR) 90 MG tablet Take 90 mg by mouth daily. Take Monday, Wednesday, and Friday.    . Methoxy PEG-Epoetin Beta (MIRCERA) 50 MCG/0.3ML SOSY Inject 225 mcg as directed every 14 (fourteen) days.     . midodrine (PROAMATINE) 10 MG tablet Take 1 tablet (10 mg total) by mouth 3 (three) times daily with meals. 90 tablet 0  . multivitamin (RENA-VIT) TABS tablet Take 1 tablet by mouth at bedtime.    . Probiotic Product (ALIGN) 4 MG CAPS Take 1 capsule by mouth daily.    . sevelamer carbonate (RENVELA) 800 MG tablet Take 2,400-4,800 mg by mouth See admin instructions. Take 6 tablets (4800 mg)  by mouth 3 times daily with meals and 3 tablets (2400 mg) 2 times daily with snacks     No current facility-administered medications for this visit.    Facility-Administered Medications Ordered in Other Visits  Medication Dose Route Frequency Provider Last Rate Last Dose  . Chlorhexidine Gluconate Cloth 2 % PADS 6 each  6 each Topical Once [name], MD        Allergies:   Codeine and Tape     ROS:  Please see the history of present illness.   Otherwise, review of systems are positive for none   All other systems are reviewed and negative.    PHYSICAL EXAM: VS:  BP (!) 98/50   Pulse 61   Ht 6\' 4"  (1.93 m)   Wt 197 lb (89.4 kg)   BMI 23.98 kg/m  , BMI Body mass index is 23.98 kg/m.  GENERAL:  Frail appearing HEENT:  Status post enucleation.  NECK:  No jugular venous distention, waveform within normal limits, carotid upstroke brisk and symmetric, no bruits, no thyromegaly LUNGS:  Clear to auscultation bilaterally BACK:  No CVA tenderness CHEST:  Unremarkable HEART:  PMI not displaced or sustained,S1 and S2 within normal limits, no S3, no S4, no clicks, no rubs, no murmurs ABD:  Flat, positive bowel sounds normal in frequency in pitch, no bruits, no rebound, no guarding, no midline pulsatile mass, no hepatomegaly, no splenomegaly EXT:  2 plus pulses throughout, no edema, left arm fistula and amputations of his toes.      Recent Labs: 04/08/2016: ALT 67 04/11/2016: Magnesium 2.5 04/12/2016: BUN 23; Creatinine, Ser 4.56; Hemoglobin 8.0; Platelets 91; Potassium 4.4; Sodium 136      Wt Readings from Last 3 Encounters:  12/02/16 197 lb (89.4 kg)  08/24/16 198 lb 12.8 oz (90.2 kg)  08/09/16 200 lb 12.8 oz (91.1 kg)      Other studies Reviewed: Additional studies/ records that were reviewed today include: Holter  Review of the above records demonstrates:  Please see elsewhere in the note.     ASSESSMENT AND PLAN:  Bradycardia He is high risk for a pacemaker. He's not  had any syncope or necessarily symptoms that are clearly related to his bradycardia arrhythmia. I talked to the patient and his wife about this again today. Therefore, no  change in therapy is planned that he will let me know if he ever has any syncope.  Hx of CABG-2013 He has had no symptoms since his perfusion study at [facility] Feb 2017 low risk.  The patient will continue with risk reduction.  End stage renal failure on dialysis [facility]) He is still being considered for transplant at [facility].    ESRD- HD MWF-Dr Deterding follows  Hypotension He does run low but this is chronic .     Current medicines are reviewed at length with the patient today.  The patient does not have concerns regarding medicines.  The following changes have been made:  None  Labs/ tests ordered today include:   No orders of the defined types were placed in this encounter.    Disposition:   FU with me in 6 months.    Signed, [name], MD  12/03/2016 4:10 PM    [facility]

## 2016-12-02 ENCOUNTER — Ambulatory Visit (INDEPENDENT_AMBULATORY_CARE_PROVIDER_SITE_OTHER): Payer: Medicare Other | Admitting: Cardiology

## 2016-12-02 ENCOUNTER — Encounter: Payer: Self-pay | Admitting: Cardiology

## 2016-12-02 VITALS — BP 98/50 | HR 61 | Ht 76.0 in | Wt 197.0 lb

## 2016-12-02 DIAGNOSIS — E119 Type 2 diabetes mellitus without complications: Secondary | ICD-10-CM | POA: Diagnosis not present

## 2016-12-02 DIAGNOSIS — I25810 Atherosclerosis of coronary artery bypass graft(s) without angina pectoris: Secondary | ICD-10-CM

## 2016-12-02 DIAGNOSIS — E1129 Type 2 diabetes mellitus with other diabetic kidney complication: Secondary | ICD-10-CM | POA: Diagnosis not present

## 2016-12-02 DIAGNOSIS — R001 Bradycardia, unspecified: Secondary | ICD-10-CM

## 2016-12-02 DIAGNOSIS — I2510 Atherosclerotic heart disease of native coronary artery without angina pectoris: Secondary | ICD-10-CM

## 2016-12-02 DIAGNOSIS — Z992 Dependence on renal dialysis: Secondary | ICD-10-CM | POA: Diagnosis not present

## 2016-12-02 DIAGNOSIS — D631 Anemia in chronic kidney disease: Secondary | ICD-10-CM | POA: Diagnosis not present

## 2016-12-02 DIAGNOSIS — N186 End stage renal disease: Secondary | ICD-10-CM | POA: Diagnosis not present

## 2016-12-02 DIAGNOSIS — D509 Iron deficiency anemia, unspecified: Secondary | ICD-10-CM | POA: Diagnosis not present

## 2016-12-02 DIAGNOSIS — N2581 Secondary hyperparathyroidism of renal origin: Secondary | ICD-10-CM | POA: Diagnosis not present

## 2016-12-02 DIAGNOSIS — K7689 Other specified diseases of liver: Secondary | ICD-10-CM | POA: Diagnosis not present

## 2016-12-02 NOTE — Patient Instructions (Signed)
Medication Instructions:  Continue current medications  Labwork: None ordered  Testing/Procedures: None ordered  Follow-Up: Your physician wants you to follow-up in: 6 Months. You will receive a reminder letter in the mail two months in advance. If you don't receive a letter, please call our office to schedule the follow-up appointment.   Any Other Special Instructions Will Be Listed Below (If Applicable).   If you need a refill on your cardiac medications before your next appointment, please call your pharmacy.

## 2016-12-03 ENCOUNTER — Encounter: Payer: Self-pay | Admitting: Cardiology

## 2016-12-05 DIAGNOSIS — N2581 Secondary hyperparathyroidism of renal origin: Secondary | ICD-10-CM | POA: Diagnosis not present

## 2016-12-05 DIAGNOSIS — K7689 Other specified diseases of liver: Secondary | ICD-10-CM | POA: Diagnosis not present

## 2016-12-05 DIAGNOSIS — D631 Anemia in chronic kidney disease: Secondary | ICD-10-CM | POA: Diagnosis not present

## 2016-12-05 DIAGNOSIS — E119 Type 2 diabetes mellitus without complications: Secondary | ICD-10-CM | POA: Diagnosis not present

## 2016-12-05 DIAGNOSIS — D509 Iron deficiency anemia, unspecified: Secondary | ICD-10-CM | POA: Diagnosis not present

## 2016-12-05 DIAGNOSIS — N186 End stage renal disease: Secondary | ICD-10-CM | POA: Diagnosis not present

## 2016-12-06 DIAGNOSIS — L2081 Atopic neurodermatitis: Secondary | ICD-10-CM | POA: Diagnosis not present

## 2016-12-06 DIAGNOSIS — L299 Pruritus, unspecified: Secondary | ICD-10-CM | POA: Diagnosis not present

## 2016-12-07 DIAGNOSIS — E119 Type 2 diabetes mellitus without complications: Secondary | ICD-10-CM | POA: Diagnosis not present

## 2016-12-07 DIAGNOSIS — K7689 Other specified diseases of liver: Secondary | ICD-10-CM | POA: Diagnosis not present

## 2016-12-07 DIAGNOSIS — N2581 Secondary hyperparathyroidism of renal origin: Secondary | ICD-10-CM | POA: Diagnosis not present

## 2016-12-07 DIAGNOSIS — D509 Iron deficiency anemia, unspecified: Secondary | ICD-10-CM | POA: Diagnosis not present

## 2016-12-07 DIAGNOSIS — N186 End stage renal disease: Secondary | ICD-10-CM | POA: Diagnosis not present

## 2016-12-07 DIAGNOSIS — D631 Anemia in chronic kidney disease: Secondary | ICD-10-CM | POA: Diagnosis not present

## 2016-12-09 DIAGNOSIS — N186 End stage renal disease: Secondary | ICD-10-CM | POA: Diagnosis not present

## 2016-12-09 DIAGNOSIS — E1129 Type 2 diabetes mellitus with other diabetic kidney complication: Secondary | ICD-10-CM | POA: Diagnosis not present

## 2016-12-09 DIAGNOSIS — D509 Iron deficiency anemia, unspecified: Secondary | ICD-10-CM | POA: Diagnosis not present

## 2016-12-09 DIAGNOSIS — D631 Anemia in chronic kidney disease: Secondary | ICD-10-CM | POA: Diagnosis not present

## 2016-12-09 DIAGNOSIS — K7689 Other specified diseases of liver: Secondary | ICD-10-CM | POA: Diagnosis not present

## 2016-12-09 DIAGNOSIS — I10 Essential (primary) hypertension: Secondary | ICD-10-CM | POA: Diagnosis not present

## 2016-12-09 DIAGNOSIS — E784 Other hyperlipidemia: Secondary | ICD-10-CM | POA: Diagnosis not present

## 2016-12-09 DIAGNOSIS — E119 Type 2 diabetes mellitus without complications: Secondary | ICD-10-CM | POA: Diagnosis not present

## 2016-12-09 DIAGNOSIS — M810 Age-related osteoporosis without current pathological fracture: Secondary | ICD-10-CM | POA: Diagnosis not present

## 2016-12-09 DIAGNOSIS — E559 Vitamin D deficiency, unspecified: Secondary | ICD-10-CM | POA: Diagnosis not present

## 2016-12-09 DIAGNOSIS — N2581 Secondary hyperparathyroidism of renal origin: Secondary | ICD-10-CM | POA: Diagnosis not present

## 2016-12-12 DIAGNOSIS — N186 End stage renal disease: Secondary | ICD-10-CM | POA: Diagnosis not present

## 2016-12-12 DIAGNOSIS — E119 Type 2 diabetes mellitus without complications: Secondary | ICD-10-CM | POA: Diagnosis not present

## 2016-12-12 DIAGNOSIS — K7689 Other specified diseases of liver: Secondary | ICD-10-CM | POA: Diagnosis not present

## 2016-12-12 DIAGNOSIS — D509 Iron deficiency anemia, unspecified: Secondary | ICD-10-CM | POA: Diagnosis not present

## 2016-12-12 DIAGNOSIS — N2581 Secondary hyperparathyroidism of renal origin: Secondary | ICD-10-CM | POA: Diagnosis not present

## 2016-12-12 DIAGNOSIS — D631 Anemia in chronic kidney disease: Secondary | ICD-10-CM | POA: Diagnosis not present

## 2016-12-13 DIAGNOSIS — K921 Melena: Secondary | ICD-10-CM | POA: Diagnosis not present

## 2016-12-13 DIAGNOSIS — R945 Abnormal results of liver function studies: Secondary | ICD-10-CM | POA: Diagnosis not present

## 2016-12-13 DIAGNOSIS — E1129 Type 2 diabetes mellitus with other diabetic kidney complication: Secondary | ICD-10-CM | POA: Diagnosis not present

## 2016-12-13 DIAGNOSIS — N186 End stage renal disease: Secondary | ICD-10-CM | POA: Diagnosis not present

## 2016-12-13 DIAGNOSIS — Z0000 Encounter for general adult medical examination without abnormal findings: Secondary | ICD-10-CM | POA: Diagnosis not present

## 2016-12-13 DIAGNOSIS — Z6825 Body mass index (BMI) 25.0-25.9, adult: Secondary | ICD-10-CM | POA: Diagnosis not present

## 2016-12-13 DIAGNOSIS — C7900 Secondary malignant neoplasm of unspecified kidney and renal pelvis: Secondary | ICD-10-CM | POA: Diagnosis not present

## 2016-12-13 DIAGNOSIS — D696 Thrombocytopenia, unspecified: Secondary | ICD-10-CM | POA: Diagnosis not present

## 2016-12-13 DIAGNOSIS — Z1389 Encounter for screening for other disorder: Secondary | ICD-10-CM | POA: Diagnosis not present

## 2016-12-13 DIAGNOSIS — E1139 Type 2 diabetes mellitus with other diabetic ophthalmic complication: Secondary | ICD-10-CM | POA: Diagnosis not present

## 2016-12-13 DIAGNOSIS — E784 Other hyperlipidemia: Secondary | ICD-10-CM | POA: Diagnosis not present

## 2016-12-13 DIAGNOSIS — Z23 Encounter for immunization: Secondary | ICD-10-CM | POA: Diagnosis not present

## 2016-12-13 DIAGNOSIS — Z992 Dependence on renal dialysis: Secondary | ICD-10-CM | POA: Diagnosis not present

## 2016-12-14 DIAGNOSIS — K7689 Other specified diseases of liver: Secondary | ICD-10-CM | POA: Diagnosis not present

## 2016-12-14 DIAGNOSIS — N186 End stage renal disease: Secondary | ICD-10-CM | POA: Diagnosis not present

## 2016-12-14 DIAGNOSIS — N2581 Secondary hyperparathyroidism of renal origin: Secondary | ICD-10-CM | POA: Diagnosis not present

## 2016-12-14 DIAGNOSIS — D631 Anemia in chronic kidney disease: Secondary | ICD-10-CM | POA: Diagnosis not present

## 2016-12-14 DIAGNOSIS — E119 Type 2 diabetes mellitus without complications: Secondary | ICD-10-CM | POA: Diagnosis not present

## 2016-12-14 DIAGNOSIS — D509 Iron deficiency anemia, unspecified: Secondary | ICD-10-CM | POA: Diagnosis not present

## 2016-12-16 DIAGNOSIS — D631 Anemia in chronic kidney disease: Secondary | ICD-10-CM | POA: Diagnosis not present

## 2016-12-16 DIAGNOSIS — N2581 Secondary hyperparathyroidism of renal origin: Secondary | ICD-10-CM | POA: Diagnosis not present

## 2016-12-16 DIAGNOSIS — K7689 Other specified diseases of liver: Secondary | ICD-10-CM | POA: Diagnosis not present

## 2016-12-16 DIAGNOSIS — E119 Type 2 diabetes mellitus without complications: Secondary | ICD-10-CM | POA: Diagnosis not present

## 2016-12-16 DIAGNOSIS — N186 End stage renal disease: Secondary | ICD-10-CM | POA: Diagnosis not present

## 2016-12-16 DIAGNOSIS — D509 Iron deficiency anemia, unspecified: Secondary | ICD-10-CM | POA: Diagnosis not present

## 2016-12-19 DIAGNOSIS — D509 Iron deficiency anemia, unspecified: Secondary | ICD-10-CM | POA: Diagnosis not present

## 2016-12-19 DIAGNOSIS — N186 End stage renal disease: Secondary | ICD-10-CM | POA: Diagnosis not present

## 2016-12-19 DIAGNOSIS — D631 Anemia in chronic kidney disease: Secondary | ICD-10-CM | POA: Diagnosis not present

## 2016-12-19 DIAGNOSIS — E119 Type 2 diabetes mellitus without complications: Secondary | ICD-10-CM | POA: Diagnosis not present

## 2016-12-19 DIAGNOSIS — K7689 Other specified diseases of liver: Secondary | ICD-10-CM | POA: Diagnosis not present

## 2016-12-19 DIAGNOSIS — N2581 Secondary hyperparathyroidism of renal origin: Secondary | ICD-10-CM | POA: Diagnosis not present

## 2016-12-21 DIAGNOSIS — D631 Anemia in chronic kidney disease: Secondary | ICD-10-CM | POA: Diagnosis not present

## 2016-12-21 DIAGNOSIS — D509 Iron deficiency anemia, unspecified: Secondary | ICD-10-CM | POA: Diagnosis not present

## 2016-12-21 DIAGNOSIS — E119 Type 2 diabetes mellitus without complications: Secondary | ICD-10-CM | POA: Diagnosis not present

## 2016-12-21 DIAGNOSIS — K7689 Other specified diseases of liver: Secondary | ICD-10-CM | POA: Diagnosis not present

## 2016-12-21 DIAGNOSIS — N186 End stage renal disease: Secondary | ICD-10-CM | POA: Diagnosis not present

## 2016-12-21 DIAGNOSIS — N2581 Secondary hyperparathyroidism of renal origin: Secondary | ICD-10-CM | POA: Diagnosis not present

## 2016-12-23 DIAGNOSIS — N2581 Secondary hyperparathyroidism of renal origin: Secondary | ICD-10-CM | POA: Diagnosis not present

## 2016-12-23 DIAGNOSIS — K7689 Other specified diseases of liver: Secondary | ICD-10-CM | POA: Diagnosis not present

## 2016-12-23 DIAGNOSIS — D509 Iron deficiency anemia, unspecified: Secondary | ICD-10-CM | POA: Diagnosis not present

## 2016-12-23 DIAGNOSIS — N186 End stage renal disease: Secondary | ICD-10-CM | POA: Diagnosis not present

## 2016-12-23 DIAGNOSIS — E119 Type 2 diabetes mellitus without complications: Secondary | ICD-10-CM | POA: Diagnosis not present

## 2016-12-23 DIAGNOSIS — D631 Anemia in chronic kidney disease: Secondary | ICD-10-CM | POA: Diagnosis not present

## 2016-12-26 DIAGNOSIS — N2581 Secondary hyperparathyroidism of renal origin: Secondary | ICD-10-CM | POA: Diagnosis not present

## 2016-12-26 DIAGNOSIS — D631 Anemia in chronic kidney disease: Secondary | ICD-10-CM | POA: Diagnosis not present

## 2016-12-26 DIAGNOSIS — E119 Type 2 diabetes mellitus without complications: Secondary | ICD-10-CM | POA: Diagnosis not present

## 2016-12-26 DIAGNOSIS — N186 End stage renal disease: Secondary | ICD-10-CM | POA: Diagnosis not present

## 2016-12-26 DIAGNOSIS — K7689 Other specified diseases of liver: Secondary | ICD-10-CM | POA: Diagnosis not present

## 2016-12-26 DIAGNOSIS — D509 Iron deficiency anemia, unspecified: Secondary | ICD-10-CM | POA: Diagnosis not present

## 2016-12-28 DIAGNOSIS — D631 Anemia in chronic kidney disease: Secondary | ICD-10-CM | POA: Diagnosis not present

## 2016-12-28 DIAGNOSIS — E119 Type 2 diabetes mellitus without complications: Secondary | ICD-10-CM | POA: Diagnosis not present

## 2016-12-28 DIAGNOSIS — N2581 Secondary hyperparathyroidism of renal origin: Secondary | ICD-10-CM | POA: Diagnosis not present

## 2016-12-28 DIAGNOSIS — N186 End stage renal disease: Secondary | ICD-10-CM | POA: Diagnosis not present

## 2016-12-28 DIAGNOSIS — K7689 Other specified diseases of liver: Secondary | ICD-10-CM | POA: Diagnosis not present

## 2016-12-28 DIAGNOSIS — D509 Iron deficiency anemia, unspecified: Secondary | ICD-10-CM | POA: Diagnosis not present

## 2016-12-30 DIAGNOSIS — D631 Anemia in chronic kidney disease: Secondary | ICD-10-CM | POA: Diagnosis not present

## 2016-12-30 DIAGNOSIS — N2581 Secondary hyperparathyroidism of renal origin: Secondary | ICD-10-CM | POA: Diagnosis not present

## 2016-12-30 DIAGNOSIS — D509 Iron deficiency anemia, unspecified: Secondary | ICD-10-CM | POA: Diagnosis not present

## 2016-12-30 DIAGNOSIS — N186 End stage renal disease: Secondary | ICD-10-CM | POA: Diagnosis not present

## 2016-12-30 DIAGNOSIS — K7689 Other specified diseases of liver: Secondary | ICD-10-CM | POA: Diagnosis not present

## 2016-12-30 DIAGNOSIS — E119 Type 2 diabetes mellitus without complications: Secondary | ICD-10-CM | POA: Diagnosis not present

## 2017-01-01 DIAGNOSIS — E1129 Type 2 diabetes mellitus with other diabetic kidney complication: Secondary | ICD-10-CM | POA: Diagnosis not present

## 2017-01-01 DIAGNOSIS — N186 End stage renal disease: Secondary | ICD-10-CM | POA: Diagnosis not present

## 2017-01-01 DIAGNOSIS — Z992 Dependence on renal dialysis: Secondary | ICD-10-CM | POA: Diagnosis not present

## 2017-01-02 ENCOUNTER — Telehealth: Payer: Self-pay | Admitting: Cardiology

## 2017-01-02 DIAGNOSIS — K7689 Other specified diseases of liver: Secondary | ICD-10-CM | POA: Diagnosis not present

## 2017-01-02 DIAGNOSIS — E119 Type 2 diabetes mellitus without complications: Secondary | ICD-10-CM | POA: Diagnosis not present

## 2017-01-02 DIAGNOSIS — D509 Iron deficiency anemia, unspecified: Secondary | ICD-10-CM | POA: Diagnosis not present

## 2017-01-02 DIAGNOSIS — D631 Anemia in chronic kidney disease: Secondary | ICD-10-CM | POA: Diagnosis not present

## 2017-01-02 DIAGNOSIS — N2581 Secondary hyperparathyroidism of renal origin: Secondary | ICD-10-CM | POA: Diagnosis not present

## 2017-01-02 DIAGNOSIS — N186 End stage renal disease: Secondary | ICD-10-CM | POA: Diagnosis not present

## 2017-01-02 NOTE — Telephone Encounter (Signed)
Attempted to return call to patient's wife who called in. LMTCB. Would need to have dialysis fax over readings.    Per last Hochrein, MD note on 8/31: He wore a monitor and was noted to have HR of 29 episodically with episodic 2:1 block and sinus brady.  However, because of his severe comorbid conditions including his ESRD it was suggested that he was high risk for pacing with infection in particular.

## 2017-01-02 NOTE — Telephone Encounter (Signed)
STAT if HR is under 50 or over 120 (normal HR is 60-100 beats per minute)  1) What is your heart rate? 65 32 41  Do you have a log of your heart rate readings (document readings)? yes 2) Do you have any other symptoms? No   Pt wife is calling pt is at dialysis she will get in in an hour, a rn from the dialysis center called her

## 2017-01-02 NOTE — Telephone Encounter (Signed)
LMTCB

## 2017-01-02 NOTE — Telephone Encounter (Signed)
Follow up    Pt wife is calling back. Missed call. Please call again

## 2017-01-02 NOTE — Telephone Encounter (Signed)
Follow up     Pt wife is calling back for nurse. Please call

## 2017-01-03 NOTE — Telephone Encounter (Signed)
msg left for pt to call.

## 2017-01-04 DIAGNOSIS — N2581 Secondary hyperparathyroidism of renal origin: Secondary | ICD-10-CM | POA: Diagnosis not present

## 2017-01-04 DIAGNOSIS — D509 Iron deficiency anemia, unspecified: Secondary | ICD-10-CM | POA: Diagnosis not present

## 2017-01-04 DIAGNOSIS — K7689 Other specified diseases of liver: Secondary | ICD-10-CM | POA: Diagnosis not present

## 2017-01-04 DIAGNOSIS — E119 Type 2 diabetes mellitus without complications: Secondary | ICD-10-CM | POA: Diagnosis not present

## 2017-01-04 DIAGNOSIS — D631 Anemia in chronic kidney disease: Secondary | ICD-10-CM | POA: Diagnosis not present

## 2017-01-04 DIAGNOSIS — N186 End stage renal disease: Secondary | ICD-10-CM | POA: Diagnosis not present

## 2017-01-04 NOTE — Telephone Encounter (Signed)
Follow up     Pt wife returning call. Please call back

## 2017-01-04 NOTE — Telephone Encounter (Signed)
Spoke to wife. She reports pt had gone to dialysis on Monday, had marked low hr while monitored during dialysis. Rate came down to 32 at lowest and then resolved back into normal range. She didn't have a printout of readings - this was supposed to have been given to patient on Monday but he didn't receive it.   Today he returned for dialysis, but nurse today informed wife she did not know how to do a printout of vitals.  Wife notes "no problems whatsoever", pt had not had any symptoms during bradycardic episodes. Wife states she just wanted to report this as advised by dialysis center.  Refer to 24 hr monitor for findings r/t this.  Wife aware I am routing to Dr. Percival Spanish for any advice.  Recommended she call back if noting any acute or new observations.

## 2017-01-05 NOTE — Telephone Encounter (Signed)
Noted   No change in therapy

## 2017-01-06 DIAGNOSIS — D509 Iron deficiency anemia, unspecified: Secondary | ICD-10-CM | POA: Diagnosis not present

## 2017-01-06 DIAGNOSIS — E119 Type 2 diabetes mellitus without complications: Secondary | ICD-10-CM | POA: Diagnosis not present

## 2017-01-06 DIAGNOSIS — N2581 Secondary hyperparathyroidism of renal origin: Secondary | ICD-10-CM | POA: Diagnosis not present

## 2017-01-06 DIAGNOSIS — N186 End stage renal disease: Secondary | ICD-10-CM | POA: Diagnosis not present

## 2017-01-06 DIAGNOSIS — K7689 Other specified diseases of liver: Secondary | ICD-10-CM | POA: Diagnosis not present

## 2017-01-06 DIAGNOSIS — D631 Anemia in chronic kidney disease: Secondary | ICD-10-CM | POA: Diagnosis not present

## 2017-01-09 DIAGNOSIS — N2581 Secondary hyperparathyroidism of renal origin: Secondary | ICD-10-CM | POA: Diagnosis not present

## 2017-01-09 DIAGNOSIS — K7689 Other specified diseases of liver: Secondary | ICD-10-CM | POA: Diagnosis not present

## 2017-01-09 DIAGNOSIS — D631 Anemia in chronic kidney disease: Secondary | ICD-10-CM | POA: Diagnosis not present

## 2017-01-09 DIAGNOSIS — E119 Type 2 diabetes mellitus without complications: Secondary | ICD-10-CM | POA: Diagnosis not present

## 2017-01-09 DIAGNOSIS — D509 Iron deficiency anemia, unspecified: Secondary | ICD-10-CM | POA: Diagnosis not present

## 2017-01-09 DIAGNOSIS — N186 End stage renal disease: Secondary | ICD-10-CM | POA: Diagnosis not present

## 2017-01-10 DIAGNOSIS — C641 Malignant neoplasm of right kidney, except renal pelvis: Secondary | ICD-10-CM | POA: Diagnosis not present

## 2017-01-10 DIAGNOSIS — N281 Cyst of kidney, acquired: Secondary | ICD-10-CM | POA: Diagnosis not present

## 2017-01-10 DIAGNOSIS — C649 Malignant neoplasm of unspecified kidney, except renal pelvis: Secondary | ICD-10-CM | POA: Diagnosis not present

## 2017-01-10 DIAGNOSIS — C642 Malignant neoplasm of left kidney, except renal pelvis: Secondary | ICD-10-CM | POA: Diagnosis not present

## 2017-01-11 DIAGNOSIS — N186 End stage renal disease: Secondary | ICD-10-CM | POA: Diagnosis not present

## 2017-01-11 DIAGNOSIS — E119 Type 2 diabetes mellitus without complications: Secondary | ICD-10-CM | POA: Diagnosis not present

## 2017-01-11 DIAGNOSIS — D509 Iron deficiency anemia, unspecified: Secondary | ICD-10-CM | POA: Diagnosis not present

## 2017-01-11 DIAGNOSIS — D631 Anemia in chronic kidney disease: Secondary | ICD-10-CM | POA: Diagnosis not present

## 2017-01-11 DIAGNOSIS — K7689 Other specified diseases of liver: Secondary | ICD-10-CM | POA: Diagnosis not present

## 2017-01-11 DIAGNOSIS — N2581 Secondary hyperparathyroidism of renal origin: Secondary | ICD-10-CM | POA: Diagnosis not present

## 2017-01-11 DIAGNOSIS — E1129 Type 2 diabetes mellitus with other diabetic kidney complication: Secondary | ICD-10-CM | POA: Diagnosis not present

## 2017-01-13 DIAGNOSIS — N2581 Secondary hyperparathyroidism of renal origin: Secondary | ICD-10-CM | POA: Diagnosis not present

## 2017-01-13 DIAGNOSIS — D509 Iron deficiency anemia, unspecified: Secondary | ICD-10-CM | POA: Diagnosis not present

## 2017-01-13 DIAGNOSIS — K7689 Other specified diseases of liver: Secondary | ICD-10-CM | POA: Diagnosis not present

## 2017-01-13 DIAGNOSIS — N186 End stage renal disease: Secondary | ICD-10-CM | POA: Diagnosis not present

## 2017-01-13 DIAGNOSIS — K921 Melena: Secondary | ICD-10-CM | POA: Diagnosis not present

## 2017-01-13 DIAGNOSIS — E119 Type 2 diabetes mellitus without complications: Secondary | ICD-10-CM | POA: Diagnosis not present

## 2017-01-13 DIAGNOSIS — D631 Anemia in chronic kidney disease: Secondary | ICD-10-CM | POA: Diagnosis not present

## 2017-01-16 DIAGNOSIS — D631 Anemia in chronic kidney disease: Secondary | ICD-10-CM | POA: Diagnosis not present

## 2017-01-16 DIAGNOSIS — N186 End stage renal disease: Secondary | ICD-10-CM | POA: Diagnosis not present

## 2017-01-16 DIAGNOSIS — K7689 Other specified diseases of liver: Secondary | ICD-10-CM | POA: Diagnosis not present

## 2017-01-16 DIAGNOSIS — D509 Iron deficiency anemia, unspecified: Secondary | ICD-10-CM | POA: Diagnosis not present

## 2017-01-16 DIAGNOSIS — N2581 Secondary hyperparathyroidism of renal origin: Secondary | ICD-10-CM | POA: Diagnosis not present

## 2017-01-16 DIAGNOSIS — E119 Type 2 diabetes mellitus without complications: Secondary | ICD-10-CM | POA: Diagnosis not present

## 2017-01-17 ENCOUNTER — Encounter: Payer: Self-pay | Admitting: Physician Assistant

## 2017-01-18 DIAGNOSIS — E119 Type 2 diabetes mellitus without complications: Secondary | ICD-10-CM | POA: Diagnosis not present

## 2017-01-18 DIAGNOSIS — N186 End stage renal disease: Secondary | ICD-10-CM | POA: Diagnosis not present

## 2017-01-18 DIAGNOSIS — C61 Malignant neoplasm of prostate: Secondary | ICD-10-CM | POA: Diagnosis not present

## 2017-01-18 DIAGNOSIS — C641 Malignant neoplasm of right kidney, except renal pelvis: Secondary | ICD-10-CM | POA: Diagnosis not present

## 2017-01-18 DIAGNOSIS — D509 Iron deficiency anemia, unspecified: Secondary | ICD-10-CM | POA: Diagnosis not present

## 2017-01-18 DIAGNOSIS — D631 Anemia in chronic kidney disease: Secondary | ICD-10-CM | POA: Diagnosis not present

## 2017-01-18 DIAGNOSIS — K7689 Other specified diseases of liver: Secondary | ICD-10-CM | POA: Diagnosis not present

## 2017-01-18 DIAGNOSIS — N2581 Secondary hyperparathyroidism of renal origin: Secondary | ICD-10-CM | POA: Diagnosis not present

## 2017-01-19 ENCOUNTER — Other Ambulatory Visit (HOSPITAL_COMMUNITY): Payer: Self-pay | Admitting: Urology

## 2017-01-19 ENCOUNTER — Ambulatory Visit (HOSPITAL_COMMUNITY)
Admission: RE | Admit: 2017-01-19 | Discharge: 2017-01-19 | Disposition: A | Discharge status: Home / Self Care | Payer: Medicare Other | Source: Ambulatory Visit | Attending: Urology | Admitting: Urology

## 2017-01-19 DIAGNOSIS — R918 Other nonspecific abnormal finding of lung field: Secondary | ICD-10-CM | POA: Diagnosis not present

## 2017-01-19 DIAGNOSIS — I700 Atherosclerosis of aorta: Secondary | ICD-10-CM | POA: Insufficient documentation

## 2017-01-19 DIAGNOSIS — C641 Malignant neoplasm of right kidney, except renal pelvis: Secondary | ICD-10-CM

## 2017-01-19 DIAGNOSIS — Z951 Presence of aortocoronary bypass graft: Secondary | ICD-10-CM | POA: Insufficient documentation

## 2017-01-19 DIAGNOSIS — I2510 Atherosclerotic heart disease of native coronary artery without angina pectoris: Secondary | ICD-10-CM | POA: Diagnosis not present

## 2017-01-20 DIAGNOSIS — K7689 Other specified diseases of liver: Secondary | ICD-10-CM | POA: Diagnosis not present

## 2017-01-20 DIAGNOSIS — N2581 Secondary hyperparathyroidism of renal origin: Secondary | ICD-10-CM | POA: Diagnosis not present

## 2017-01-20 DIAGNOSIS — D631 Anemia in chronic kidney disease: Secondary | ICD-10-CM | POA: Diagnosis not present

## 2017-01-20 DIAGNOSIS — D509 Iron deficiency anemia, unspecified: Secondary | ICD-10-CM | POA: Diagnosis not present

## 2017-01-20 DIAGNOSIS — E119 Type 2 diabetes mellitus without complications: Secondary | ICD-10-CM | POA: Diagnosis not present

## 2017-01-20 DIAGNOSIS — N186 End stage renal disease: Secondary | ICD-10-CM | POA: Diagnosis not present

## 2017-01-23 DIAGNOSIS — E119 Type 2 diabetes mellitus without complications: Secondary | ICD-10-CM | POA: Diagnosis not present

## 2017-01-23 DIAGNOSIS — D631 Anemia in chronic kidney disease: Secondary | ICD-10-CM | POA: Diagnosis not present

## 2017-01-23 DIAGNOSIS — K7689 Other specified diseases of liver: Secondary | ICD-10-CM | POA: Diagnosis not present

## 2017-01-23 DIAGNOSIS — N2581 Secondary hyperparathyroidism of renal origin: Secondary | ICD-10-CM | POA: Diagnosis not present

## 2017-01-23 DIAGNOSIS — D509 Iron deficiency anemia, unspecified: Secondary | ICD-10-CM | POA: Diagnosis not present

## 2017-01-23 DIAGNOSIS — N186 End stage renal disease: Secondary | ICD-10-CM | POA: Diagnosis not present

## 2017-01-25 DIAGNOSIS — E119 Type 2 diabetes mellitus without complications: Secondary | ICD-10-CM | POA: Diagnosis not present

## 2017-01-25 DIAGNOSIS — D509 Iron deficiency anemia, unspecified: Secondary | ICD-10-CM | POA: Diagnosis not present

## 2017-01-25 DIAGNOSIS — N186 End stage renal disease: Secondary | ICD-10-CM | POA: Diagnosis not present

## 2017-01-25 DIAGNOSIS — D631 Anemia in chronic kidney disease: Secondary | ICD-10-CM | POA: Diagnosis not present

## 2017-01-25 DIAGNOSIS — N2581 Secondary hyperparathyroidism of renal origin: Secondary | ICD-10-CM | POA: Diagnosis not present

## 2017-01-25 DIAGNOSIS — K7689 Other specified diseases of liver: Secondary | ICD-10-CM | POA: Diagnosis not present

## 2017-01-26 ENCOUNTER — Encounter (INDEPENDENT_AMBULATORY_CARE_PROVIDER_SITE_OTHER): Payer: Self-pay

## 2017-01-26 ENCOUNTER — Ambulatory Visit (INDEPENDENT_AMBULATORY_CARE_PROVIDER_SITE_OTHER): Payer: Medicare Other | Admitting: Physician Assistant

## 2017-01-26 ENCOUNTER — Encounter: Payer: Self-pay | Admitting: Physician Assistant

## 2017-01-26 VITALS — BP 90/60 | HR 65 | Ht 76.0 in | Wt 204.0 lb

## 2017-01-26 DIAGNOSIS — D649 Anemia, unspecified: Secondary | ICD-10-CM | POA: Diagnosis not present

## 2017-01-26 DIAGNOSIS — K921 Melena: Secondary | ICD-10-CM

## 2017-01-26 DIAGNOSIS — Z85528 Personal history of other malignant neoplasm of kidney: Secondary | ICD-10-CM | POA: Diagnosis not present

## 2017-01-26 DIAGNOSIS — I25810 Atherosclerosis of coronary artery bypass graft(s) without angina pectoris: Secondary | ICD-10-CM | POA: Diagnosis not present

## 2017-01-26 NOTE — Progress Notes (Signed)
Subjective:    Patient ID: [name], male    DOB: 1951-02-04, 66 y.o.   MRN: [id number]  HPI [name] is a 66 year old white male, referred by Dr. Brigitte Pulse for evaluation of heme positive stool and recent episode of black stools. Patient is known to Dr. Havery Moros. He has a multitude of serious medical comorbidities including coronary artery disease, status post CABG, adult-onset diabetes mellitus, complicated by blindness and end-stage renal disease for which she is now on dialysis He has hypotension and is maintained on Mr. drain, peripheral vascular disease, and has had a 2-1 AV block with episodes of bradycardia with heart rate down into the high 20s low 30s which thus far has been fairly asymptomatic. He is followed by Dr. Linna Darner 9, he was not felt to be a good candidate for pacemaker placement due to increased risk of infection. Also diagnosed with renal cell CA several years ago and had a recurrence last year and required exploratory lap, resection of retroperitoneal mass and right colectomy as well as partial hepatic resection by Dr. Alinda Money and Dr. Marcello Moores in January 2018. He last had colonoscopy in August 2017 after an initial abnormal CT scan was done. The prep was not good he had some liquid stool in the colon however the ascending colon and cecum were felt to be normal doubt any evidence of mass. He had suggestion of a couple tiny polyps in the cecum which were not removed due to poor prep and nonbleeding internal hemorrhoids. Patient's wife says that about 6 weeks ago he started having black stools. He is on chronic iron the stools are generally not completely black. He had black bowel movements for a couple of weeks which have since subsided and stools have been normal over the past couple of weeks. Patient has no complaints of abdominal pain discomfort or cramping. Denies any heartburn indigestion dysphagia or nausea. Labs by PCP showed hemoglobin of 11.2, hematocrit of 36.7 and MCV of  98.2, LFTs in September 2018 with T bili 0.8 alkaline phosphatase 195 ALT of 203 and AST of 90.  Patient had CT of the chest done by Dr. Alinda Money in October 2018 which showed irregular soft tissue density in the right nephrectomy bed measuring 2.2 x 1.6 cm suspicious for local recurrence, and dedicated abdominal pelvic CT was recommended. There is no evidence of metastatic disease in the lungs. Patient's wife states that Dr. Alinda Money was planning to meet with IR and oncology regarding further treatment. He has not been scheduled for CT of the abdomen and pelvis.  Review of Systems Pertinent positive and negative review of systems were noted in the above HPI section.  All other review of systems was otherwise negative.  Outpatient Encounter Prescriptions as of 01/26/2017  Medication Sig  . aspirin EC 81 MG tablet Take 81 mg by mouth at bedtime.  [name] atorvastatin (LIPITOR) 10 MG tablet Take 10 mg by mouth daily.  . cinacalcet (SENSIPAR) 90 MG tablet Take 90 mg by mouth daily.  . Methoxy PEG-Epoetin Beta (MIRCERA) 50 MCG/0.3ML SOSY Inject 225 mcg as directed every 14 (fourteen) days.   . midodrine (PROAMATINE) 10 MG tablet Take 1 tablet (10 mg total) by mouth 3 (three) times daily with meals.  . Probiotic Product (ALIGN) 4 MG CAPS Take 1 capsule by mouth daily.  . sevelamer carbonate (RENVELA) 800 MG tablet Take 2,400-4,800 mg by mouth See admin instructions. Take 6 tablets (4800 mg) by mouth 3 times daily with meals and 3 tablets (2400  mg) 2 times daily with snacks  . [DISCONTINUED] cinacalcet (SENSIPAR) 90 MG tablet Take 90 mg by mouth daily. Take Monday, Wednesday, and Friday.  . [DISCONTINUED] multivitamin (RENA-VIT) TABS tablet Take 1 tablet by mouth at bedtime.   Facility-Administered Encounter Medications as of 01/26/2017  Medication  . Chlorhexidine Gluconate Cloth 2 % PADS 6 each   Allergies  Allergen Reactions  . Codeine Other (See Comments)     [place]  . Tape Other  (See Comments)    Plastic tape tears skin off, please use paper tape instead.   Patient Active Problem List   Diagnosis Date Noted  . Macrocytic anemia 04/11/2016  . Diarrhea 04/11/2016  . Renal cell carcinoma ([place]) 04/07/2016  . Hx of exploratory laparotomy 04/07/2016  . Shock circulatory ([place]) 04/07/2016  . Pre-operative cardiovascular examination 02/19/2016  . Bacteremia   . Abnormal CT of the abdomen   . Abdominal pain 11/01/2015  . UTI (lower urinary tract infection) 11/01/2015  . Sepsis ([place]) 11/01/2015  . Bilateral blindness 06/16/2015  . Carcinoma of kidney ([place]) 06/16/2015  . Type 2 diabetes mellitus ([place]) 06/16/2015  . Intra-abdominal infection 05/11/2015  . Chronic anemia 05/11/2015  . Infection in abdomen ([place]) 05/11/2015  . Second degree AV block, Mobitz type I 02/15/2015  . Atrioventricular block, Mobitz type 1, Wenckebach 02/15/2015  . Hypotension 02/14/2015  . Hypoglycemia 02/13/2015  . DM type 2, goal A1c below 7 02/13/2015  . Blind   . DM type 2 (diabetes mellitus, type 2) ([place])   . Clear cell renal cell carcinoma (HCC)   . CAD in native artery 09/10/2014  . Renal carcinoma ([place])   . Non-intractable cyclical vomiting with nausea   . Hydronephrosis 07/28/2014  . Coccyx pain 01/03/2014  . Thrombocytopenia ([place]) 01/03/2014  . Intertrochanteric fracture of left hip ([place]) 12/30/2013  . Dialysis patient ([place]) 12/30/2013  . Hip fracture ([place]) 12/30/2013  . Dependence on renal dialysis ([place]) 12/30/2013  . Hypotension of hemodialysis 06/19/2013  . Malignant neoplasm of prostate ([place]) 01/30/2012  . Diabetes mellitus ([place]) 08/30/2011  . Peripheral vascular disease ([place]) 05/27/2009  . COLONIC POLYPS 02/12/2009  . ANEMIA 01/27/2009  . GLAUCOMA 01/27/2009  . Essential hypertension 01/27/2009  . ESRD (end stage renal disease) on dialysis ([place]) 01/27/2009  . End stage renal failure on dialysis ([place]) 01/27/2009  . DIAB W/UNS COMP TYPE II/UNS NOT STATED UNCNTRL  01/08/2008  . HYPERLIPIDEMIA-MIXED 01/08/2008  . OVERWEIGHT/OBESITY 01/08/2008  . DEPRESSIVE DISORDER NOT ELSEWHERE CLASSIFIED 01/08/2008  . Hx of CABG-2013 01/08/2008  . EDEMA 01/08/2008  . Clinical depression 01/08/2008  . HLD (hyperlipidemia) 01/08/2008   Social History   Social History  . Marital status: Married    Spouse name: N/A  . Number of children: N/A  . Years of education: N/A   Occupational History  . Not on file.   Social History Main Topics  . Smoking status: Never Smoker  . Smokeless tobacco: Never Used  . Alcohol use No  . Drug use: No  . Sexual activity: No   Other Topics Concern  . Not on file   Social History Narrative  . No narrative on file    Mr. Hickle family history includes Breast cancer in his unknown relative; Cancer in his brother, sister, and unknown relative; Cancer (age of onset: 11) in his father; Cancer (age of onset: 78) in his mother; Coronary artery disease in his unknown relative; Diabetes in his unknown relative; Emphysema in his father; Heart  attack in his brother; Heart disease in his brother, father, and mother; Hyperlipidemia in his brother, father, and mother; Hypertension in his brother, father, and mother; Kidney disease in his unknown relative; Lung cancer in his unknown relative; Ovarian cancer in his unknown relative; Peripheral vascular disease in his brother; Varicose Veins in his mother.      Objective:    Vitals:   01/26/17 0957  BP: 90/60  Pulse: 65    Physical Exam ; well-developed older white male, in no acute distress, he ambulates with a walker, patient is blind, accompanied by his wife. Blood pressure 90/60 pulse 65, BMI 24.8. HEENT ;nontraumatic normocephalic  , keeps eyes closed, cardiovascular regular rate and rhythm with S1-S2, Pulmonary; clear bilaterally, Abdomen; soft, bowel sounds are present he has a large incisional scar across the right upper and mid abdomen, nontender no palpable mass or  hepatosplenomegaly, Rectal ;exam no external lesion noted stool is brown and heme negative today, Extremities; no clubbing cyanosis or edema skin warm and dry, Neuropsych; mood and affect appropriate, though affect flat       Assessment & Plan:   #71 66 year old white male with recent episode of what sounds like melena which occurred over a 2 to three-week period and resolved a couple of weeks ago. Stool is heme-negative today. Etiology of bleeding is not clear, fortunately has subsided.Reubin Milan rule out upper or lower GI source in patient with multiple serious comorbidities. No significant drop in hemoglobin #2 recurrent renal cell cancer status post resection of retroperitoneal mass, right colectomy, partial hepatic resection in January 2018 with most recent CT of the chest showing and in the nephrectomy bed. Rule out other intra-abdominal involvement with tumor #3 end-stage renal disease on dialysis #4  blindness   #5 adult-onset diabetes mellitus #6 history of 2-1 AV block with episodes of bradycardia into the low 30s #7 hypotension height-on midodrine #8 history of adenomatous colon polyps on colonoscopy 2016, last colonoscopy August 2017 with poor prep   Plan; Long discussion with the patient and his wife today. He is at higher risk for complications with sedation and procedures due to his multiple comorbidities and episodes of significant bradycardia. If endoscopic evaluation to be undertaken will need to be scheduled at the hospital with Dr. Havery Moros. We will proceed with CT scan of the abdomen and pelvis with contrast as initial study, then depending on results can decide if EGD/colonoscopy needed. Patient's wife will notify us if he has any recurrence of melena. #8 coronary artery disease status post CABG 2013   Nikia Levels S Lenard Kampf PA-C 01/26/2017   Cc: [name], MD

## 2017-01-26 NOTE — Patient Instructions (Signed)
  You have been scheduled for a CT scan of the abdomen and pelvis at [facility] ([address]---this is in the same building as Press photographer) You are scheduled on Tuesday 10-30 at 3:30 PM. You should arrive at 3:15 PM  for registration. Please follow the written instructions below on the day of your exam:  WARNING: IF YOU ARE ALLERGIC TO IODINE/X-RAY DYE, PLEASE NOTIFY RADIOLOGY IMMEDIATELY AT [phone]! YOU WILL BE GIVEN A 13 HOUR PREMEDICATION PREP.  1) Do not eat  anything after 11:30 am (4 hours prior to your test) 2) You have been given 2 bottles of oral contrast to drink. The solution may taste   better if refrigerated, but do NOT add ice or any other liquid to this solution. Shake well before drinking.    Drink 1 bottle of contrast @ 1:30 PM (2 hours prior to your exam)  Drink 1 bottle of contrast @ 2:30 PM (1 hour prior to your exam)  You may take any medications as prescribed with a small amount of water except for the following: Metformin, Glucophage, Glucovance, Avandamet, Riomet, Fortamet, Actoplus Met, Janumet, Glumetza or Metaglip. The above medications must be held the day of the exam AND 48 hours after the exam.  The purpose of you drinking the oral contrast is to aid in the visualization of your intestinal tract. The contrast solution may cause some diarrhea. Before your exam is started, you will be given a small amount of fluid to drink. Depending on your individual set of symptoms, you may also receive an intravenous injection of x-ray contrast/dye. Plan on being at [facility] for 30 minutes or long, depending on the type of exam you are having performed.  If you have any questions regarding your exam or if you need to reschedule, you may call the CT department at [phone] between the hours of 8:00 am and 5:00 pm, Monday-Friday.   If you are age 51 or older, your body mass index should be between 23-30. Your Body mass index is 24.83 kg/m. If  this is out of the aforementioned range listed, please consider follow up with your Primary Care Provider.

## 2017-01-26 NOTE — Progress Notes (Signed)
Agree with assessment and plan as outlined. Significant comorbidities, higher than average risk for endoscopy. Agree with CT first. I would also place him empirically on a PPI if there was a question of melena - omeprazole 40mg  once daily. Thanks

## 2017-01-27 DIAGNOSIS — D509 Iron deficiency anemia, unspecified: Secondary | ICD-10-CM | POA: Diagnosis not present

## 2017-01-27 DIAGNOSIS — N2581 Secondary hyperparathyroidism of renal origin: Secondary | ICD-10-CM | POA: Diagnosis not present

## 2017-01-27 DIAGNOSIS — E119 Type 2 diabetes mellitus without complications: Secondary | ICD-10-CM | POA: Diagnosis not present

## 2017-01-27 DIAGNOSIS — K7689 Other specified diseases of liver: Secondary | ICD-10-CM | POA: Diagnosis not present

## 2017-01-27 DIAGNOSIS — N186 End stage renal disease: Secondary | ICD-10-CM | POA: Diagnosis not present

## 2017-01-27 DIAGNOSIS — D631 Anemia in chronic kidney disease: Secondary | ICD-10-CM | POA: Diagnosis not present

## 2017-01-30 DIAGNOSIS — N186 End stage renal disease: Secondary | ICD-10-CM | POA: Diagnosis not present

## 2017-01-30 DIAGNOSIS — D509 Iron deficiency anemia, unspecified: Secondary | ICD-10-CM | POA: Diagnosis not present

## 2017-01-30 DIAGNOSIS — D631 Anemia in chronic kidney disease: Secondary | ICD-10-CM | POA: Diagnosis not present

## 2017-01-30 DIAGNOSIS — K7689 Other specified diseases of liver: Secondary | ICD-10-CM | POA: Diagnosis not present

## 2017-01-30 DIAGNOSIS — E119 Type 2 diabetes mellitus without complications: Secondary | ICD-10-CM | POA: Diagnosis not present

## 2017-01-30 DIAGNOSIS — N2581 Secondary hyperparathyroidism of renal origin: Secondary | ICD-10-CM | POA: Diagnosis not present

## 2017-01-31 ENCOUNTER — Ambulatory Visit (INDEPENDENT_AMBULATORY_CARE_PROVIDER_SITE_OTHER)
Admission: RE | Admit: 2017-01-31 | Discharge: 2017-01-31 | Disposition: A | Discharge status: Home / Self Care | Payer: Medicare Other | Source: Ambulatory Visit | Attending: Physician Assistant | Admitting: Physician Assistant

## 2017-01-31 DIAGNOSIS — K921 Melena: Secondary | ICD-10-CM | POA: Diagnosis not present

## 2017-01-31 DIAGNOSIS — I700 Atherosclerosis of aorta: Secondary | ICD-10-CM | POA: Diagnosis not present

## 2017-01-31 DIAGNOSIS — Z85528 Personal history of other malignant neoplasm of kidney: Secondary | ICD-10-CM

## 2017-01-31 MED ORDER — IOPAMIDOL (ISOVUE-300) INJECTION 61%
100.0000 mL | Freq: Once | INTRAVENOUS | Status: AC | PRN
  Administered 2017-01-31: 100 mL via INTRAVENOUS

## 2017-02-01 ENCOUNTER — Encounter: Payer: Self-pay | Admitting: Physician Assistant

## 2017-02-01 DIAGNOSIS — E119 Type 2 diabetes mellitus without complications: Secondary | ICD-10-CM | POA: Diagnosis not present

## 2017-02-01 DIAGNOSIS — N2581 Secondary hyperparathyroidism of renal origin: Secondary | ICD-10-CM | POA: Diagnosis not present

## 2017-02-01 DIAGNOSIS — D631 Anemia in chronic kidney disease: Secondary | ICD-10-CM | POA: Diagnosis not present

## 2017-02-01 DIAGNOSIS — E1129 Type 2 diabetes mellitus with other diabetic kidney complication: Secondary | ICD-10-CM | POA: Diagnosis not present

## 2017-02-01 DIAGNOSIS — K7689 Other specified diseases of liver: Secondary | ICD-10-CM | POA: Diagnosis not present

## 2017-02-01 DIAGNOSIS — D509 Iron deficiency anemia, unspecified: Secondary | ICD-10-CM | POA: Diagnosis not present

## 2017-02-01 DIAGNOSIS — N186 End stage renal disease: Secondary | ICD-10-CM | POA: Diagnosis not present

## 2017-02-01 DIAGNOSIS — Z992 Dependence on renal dialysis: Secondary | ICD-10-CM | POA: Diagnosis not present

## 2017-02-02 ENCOUNTER — Other Ambulatory Visit: Payer: Self-pay

## 2017-02-02 DIAGNOSIS — D500 Iron deficiency anemia secondary to blood loss (chronic): Secondary | ICD-10-CM

## 2017-02-02 DIAGNOSIS — A419 Sepsis, unspecified organism: Secondary | ICD-10-CM

## 2017-02-02 MED ORDER — OMEPRAZOLE 40 MG PO CPDR: 40.0000 mg | DELAYED_RELEASE_CAPSULE | Freq: Every morning | ORAL | 3 refills | Status: DC

## 2017-02-03 DIAGNOSIS — N2581 Secondary hyperparathyroidism of renal origin: Secondary | ICD-10-CM | POA: Diagnosis not present

## 2017-02-03 DIAGNOSIS — Z23 Encounter for immunization: Secondary | ICD-10-CM | POA: Diagnosis not present

## 2017-02-03 DIAGNOSIS — D509 Iron deficiency anemia, unspecified: Secondary | ICD-10-CM | POA: Diagnosis not present

## 2017-02-03 DIAGNOSIS — K7689 Other specified diseases of liver: Secondary | ICD-10-CM | POA: Diagnosis not present

## 2017-02-03 DIAGNOSIS — E119 Type 2 diabetes mellitus without complications: Secondary | ICD-10-CM | POA: Diagnosis not present

## 2017-02-03 DIAGNOSIS — N186 End stage renal disease: Secondary | ICD-10-CM | POA: Diagnosis not present

## 2017-02-03 DIAGNOSIS — D631 Anemia in chronic kidney disease: Secondary | ICD-10-CM | POA: Diagnosis not present

## 2017-02-06 DIAGNOSIS — N2581 Secondary hyperparathyroidism of renal origin: Secondary | ICD-10-CM | POA: Diagnosis not present

## 2017-02-06 DIAGNOSIS — K7689 Other specified diseases of liver: Secondary | ICD-10-CM | POA: Diagnosis not present

## 2017-02-06 DIAGNOSIS — E119 Type 2 diabetes mellitus without complications: Secondary | ICD-10-CM | POA: Diagnosis not present

## 2017-02-06 DIAGNOSIS — N186 End stage renal disease: Secondary | ICD-10-CM | POA: Diagnosis not present

## 2017-02-06 DIAGNOSIS — D631 Anemia in chronic kidney disease: Secondary | ICD-10-CM | POA: Diagnosis not present

## 2017-02-06 DIAGNOSIS — D509 Iron deficiency anemia, unspecified: Secondary | ICD-10-CM | POA: Diagnosis not present

## 2017-02-08 DIAGNOSIS — E119 Type 2 diabetes mellitus without complications: Secondary | ICD-10-CM | POA: Diagnosis not present

## 2017-02-08 DIAGNOSIS — D509 Iron deficiency anemia, unspecified: Secondary | ICD-10-CM | POA: Diagnosis not present

## 2017-02-08 DIAGNOSIS — D631 Anemia in chronic kidney disease: Secondary | ICD-10-CM | POA: Diagnosis not present

## 2017-02-08 DIAGNOSIS — N2581 Secondary hyperparathyroidism of renal origin: Secondary | ICD-10-CM | POA: Diagnosis not present

## 2017-02-08 DIAGNOSIS — N186 End stage renal disease: Secondary | ICD-10-CM | POA: Diagnosis not present

## 2017-02-08 DIAGNOSIS — K7689 Other specified diseases of liver: Secondary | ICD-10-CM | POA: Diagnosis not present

## 2017-02-09 ENCOUNTER — Encounter: Payer: Self-pay | Admitting: Genetics

## 2017-02-10 DIAGNOSIS — K7689 Other specified diseases of liver: Secondary | ICD-10-CM | POA: Diagnosis not present

## 2017-02-10 DIAGNOSIS — N186 End stage renal disease: Secondary | ICD-10-CM | POA: Diagnosis not present

## 2017-02-10 DIAGNOSIS — D509 Iron deficiency anemia, unspecified: Secondary | ICD-10-CM | POA: Diagnosis not present

## 2017-02-10 DIAGNOSIS — D631 Anemia in chronic kidney disease: Secondary | ICD-10-CM | POA: Diagnosis not present

## 2017-02-10 DIAGNOSIS — E119 Type 2 diabetes mellitus without complications: Secondary | ICD-10-CM | POA: Diagnosis not present

## 2017-02-10 DIAGNOSIS — N2581 Secondary hyperparathyroidism of renal origin: Secondary | ICD-10-CM | POA: Diagnosis not present

## 2017-02-13 ENCOUNTER — Other Ambulatory Visit: Payer: Self-pay | Admitting: Urology

## 2017-02-13 DIAGNOSIS — D631 Anemia in chronic kidney disease: Secondary | ICD-10-CM | POA: Diagnosis not present

## 2017-02-13 DIAGNOSIS — E119 Type 2 diabetes mellitus without complications: Secondary | ICD-10-CM | POA: Diagnosis not present

## 2017-02-13 DIAGNOSIS — C641 Malignant neoplasm of right kidney, except renal pelvis: Secondary | ICD-10-CM

## 2017-02-13 DIAGNOSIS — K7689 Other specified diseases of liver: Secondary | ICD-10-CM | POA: Diagnosis not present

## 2017-02-13 DIAGNOSIS — D509 Iron deficiency anemia, unspecified: Secondary | ICD-10-CM | POA: Diagnosis not present

## 2017-02-13 DIAGNOSIS — N186 End stage renal disease: Secondary | ICD-10-CM | POA: Diagnosis not present

## 2017-02-13 DIAGNOSIS — N2581 Secondary hyperparathyroidism of renal origin: Secondary | ICD-10-CM | POA: Diagnosis not present

## 2017-02-15 DIAGNOSIS — N2581 Secondary hyperparathyroidism of renal origin: Secondary | ICD-10-CM | POA: Diagnosis not present

## 2017-02-15 DIAGNOSIS — D631 Anemia in chronic kidney disease: Secondary | ICD-10-CM | POA: Diagnosis not present

## 2017-02-15 DIAGNOSIS — D509 Iron deficiency anemia, unspecified: Secondary | ICD-10-CM | POA: Diagnosis not present

## 2017-02-15 DIAGNOSIS — K7689 Other specified diseases of liver: Secondary | ICD-10-CM | POA: Diagnosis not present

## 2017-02-15 DIAGNOSIS — N186 End stage renal disease: Secondary | ICD-10-CM | POA: Diagnosis not present

## 2017-02-15 DIAGNOSIS — E119 Type 2 diabetes mellitus without complications: Secondary | ICD-10-CM | POA: Diagnosis not present

## 2017-02-16 ENCOUNTER — Other Ambulatory Visit (HOSPITAL_COMMUNITY): Payer: Self-pay | Admitting: Interventional Radiology

## 2017-02-16 DIAGNOSIS — C641 Malignant neoplasm of right kidney, except renal pelvis: Secondary | ICD-10-CM

## 2017-02-17 DIAGNOSIS — N186 End stage renal disease: Secondary | ICD-10-CM | POA: Diagnosis not present

## 2017-02-17 DIAGNOSIS — D631 Anemia in chronic kidney disease: Secondary | ICD-10-CM | POA: Diagnosis not present

## 2017-02-17 DIAGNOSIS — K7689 Other specified diseases of liver: Secondary | ICD-10-CM | POA: Diagnosis not present

## 2017-02-17 DIAGNOSIS — N2581 Secondary hyperparathyroidism of renal origin: Secondary | ICD-10-CM | POA: Diagnosis not present

## 2017-02-17 DIAGNOSIS — D509 Iron deficiency anemia, unspecified: Secondary | ICD-10-CM | POA: Diagnosis not present

## 2017-02-17 DIAGNOSIS — E119 Type 2 diabetes mellitus without complications: Secondary | ICD-10-CM | POA: Diagnosis not present

## 2017-02-19 DIAGNOSIS — D509 Iron deficiency anemia, unspecified: Secondary | ICD-10-CM | POA: Diagnosis not present

## 2017-02-19 DIAGNOSIS — E119 Type 2 diabetes mellitus without complications: Secondary | ICD-10-CM | POA: Diagnosis not present

## 2017-02-19 DIAGNOSIS — K7689 Other specified diseases of liver: Secondary | ICD-10-CM | POA: Diagnosis not present

## 2017-02-19 DIAGNOSIS — N2581 Secondary hyperparathyroidism of renal origin: Secondary | ICD-10-CM | POA: Diagnosis not present

## 2017-02-19 DIAGNOSIS — N186 End stage renal disease: Secondary | ICD-10-CM | POA: Diagnosis not present

## 2017-02-19 DIAGNOSIS — D631 Anemia in chronic kidney disease: Secondary | ICD-10-CM | POA: Diagnosis not present

## 2017-02-21 DIAGNOSIS — D509 Iron deficiency anemia, unspecified: Secondary | ICD-10-CM | POA: Diagnosis not present

## 2017-02-21 DIAGNOSIS — D631 Anemia in chronic kidney disease: Secondary | ICD-10-CM | POA: Diagnosis not present

## 2017-02-21 DIAGNOSIS — K7689 Other specified diseases of liver: Secondary | ICD-10-CM | POA: Diagnosis not present

## 2017-02-21 DIAGNOSIS — N2581 Secondary hyperparathyroidism of renal origin: Secondary | ICD-10-CM | POA: Diagnosis not present

## 2017-02-21 DIAGNOSIS — E119 Type 2 diabetes mellitus without complications: Secondary | ICD-10-CM | POA: Diagnosis not present

## 2017-02-21 DIAGNOSIS — N186 End stage renal disease: Secondary | ICD-10-CM | POA: Diagnosis not present

## 2017-02-24 DIAGNOSIS — N2581 Secondary hyperparathyroidism of renal origin: Secondary | ICD-10-CM | POA: Diagnosis not present

## 2017-02-24 DIAGNOSIS — N186 End stage renal disease: Secondary | ICD-10-CM | POA: Diagnosis not present

## 2017-02-24 DIAGNOSIS — K7689 Other specified diseases of liver: Secondary | ICD-10-CM | POA: Diagnosis not present

## 2017-02-24 DIAGNOSIS — D509 Iron deficiency anemia, unspecified: Secondary | ICD-10-CM | POA: Diagnosis not present

## 2017-02-24 DIAGNOSIS — D631 Anemia in chronic kidney disease: Secondary | ICD-10-CM | POA: Diagnosis not present

## 2017-02-24 DIAGNOSIS — E119 Type 2 diabetes mellitus without complications: Secondary | ICD-10-CM | POA: Diagnosis not present

## 2017-02-27 DIAGNOSIS — K7689 Other specified diseases of liver: Secondary | ICD-10-CM | POA: Diagnosis not present

## 2017-02-27 DIAGNOSIS — N186 End stage renal disease: Secondary | ICD-10-CM | POA: Diagnosis not present

## 2017-02-27 DIAGNOSIS — D509 Iron deficiency anemia, unspecified: Secondary | ICD-10-CM | POA: Diagnosis not present

## 2017-02-27 DIAGNOSIS — E119 Type 2 diabetes mellitus without complications: Secondary | ICD-10-CM | POA: Diagnosis not present

## 2017-02-27 DIAGNOSIS — N2581 Secondary hyperparathyroidism of renal origin: Secondary | ICD-10-CM | POA: Diagnosis not present

## 2017-02-27 DIAGNOSIS — D631 Anemia in chronic kidney disease: Secondary | ICD-10-CM | POA: Diagnosis not present

## 2017-02-28 ENCOUNTER — Ambulatory Visit (HOSPITAL_COMMUNITY)
Admission: RE | Admit: 2017-02-28 | Discharge: 2017-02-28 | Disposition: A | Discharge status: Home / Self Care | Payer: Medicare Other | Source: Ambulatory Visit | Attending: Interventional Radiology | Admitting: Interventional Radiology

## 2017-02-28 DIAGNOSIS — C641 Malignant neoplasm of right kidney, except renal pelvis: Secondary | ICD-10-CM | POA: Diagnosis not present

## 2017-02-28 DIAGNOSIS — H903 Sensorineural hearing loss, bilateral: Secondary | ICD-10-CM | POA: Diagnosis not present

## 2017-02-28 DIAGNOSIS — Z905 Acquired absence of kidney: Secondary | ICD-10-CM | POA: Diagnosis not present

## 2017-02-28 DIAGNOSIS — N289 Disorder of kidney and ureter, unspecified: Secondary | ICD-10-CM | POA: Insufficient documentation

## 2017-02-28 LAB — GLUCOSE, CAPILLARY: Glucose-Capillary: 92 mg/dL (ref 65–99)

## 2017-02-28 MED ORDER — FLUDEOXYGLUCOSE F - 18 (FDG) INJECTION
10.1000 | Freq: Once | INTRAVENOUS | Status: AC | PRN
  Administered 2017-02-28: 10.1 via INTRAVENOUS

## 2017-03-01 DIAGNOSIS — N186 End stage renal disease: Secondary | ICD-10-CM | POA: Diagnosis not present

## 2017-03-01 DIAGNOSIS — K7689 Other specified diseases of liver: Secondary | ICD-10-CM | POA: Diagnosis not present

## 2017-03-01 DIAGNOSIS — D631 Anemia in chronic kidney disease: Secondary | ICD-10-CM | POA: Diagnosis not present

## 2017-03-01 DIAGNOSIS — E119 Type 2 diabetes mellitus without complications: Secondary | ICD-10-CM | POA: Diagnosis not present

## 2017-03-01 DIAGNOSIS — N2581 Secondary hyperparathyroidism of renal origin: Secondary | ICD-10-CM | POA: Diagnosis not present

## 2017-03-01 DIAGNOSIS — D509 Iron deficiency anemia, unspecified: Secondary | ICD-10-CM | POA: Diagnosis not present

## 2017-03-02 ENCOUNTER — Encounter: Payer: Self-pay | Admitting: *Deleted

## 2017-03-02 ENCOUNTER — Ambulatory Visit
Admission: RE | Admit: 2017-03-02 | Discharge: 2017-03-02 | Disposition: A | Discharge status: Home / Self Care | Payer: Medicare Other | Source: Ambulatory Visit | Attending: Urology | Admitting: Urology

## 2017-03-02 DIAGNOSIS — Z905 Acquired absence of kidney: Secondary | ICD-10-CM | POA: Diagnosis not present

## 2017-03-02 DIAGNOSIS — Z992 Dependence on renal dialysis: Secondary | ICD-10-CM | POA: Diagnosis not present

## 2017-03-02 DIAGNOSIS — C641 Malignant neoplasm of right kidney, except renal pelvis: Secondary | ICD-10-CM

## 2017-03-02 HISTORY — PX: IR RADIOLOGIST EVAL & MGMT: IMG5224

## 2017-03-02 NOTE — Progress Notes (Signed)
Chief Complaint: Recurrence of right renal carcinoma.  History of Present Illness: [name] is a 66 y.o. male with a history of bilateral renal carcinoma who is known to me after prior past bilateral renal ablation procedures.  He underwent right nephrectomy in October, 2016 at [name] due to tumor recurrence. This was complicated by postoperative infection and ultimately an enlarging right retroperitoneal soft tissue nodule consistent with tumor recurrence. This mass was sampled on 02/02/2016 with pathology confirming clear cell carcinoma. He then underwent expiratory laparotomy with resection of the right retroperitoneal clear cell recurrence by [name] on 04/07/2016. [name] also assisted in performing resection of part of the right colon adherent to the tumor mass as well as wedge resection of a portion of the liver adherent to the tumor. Pathology revealed a 5.5 cm clear cell carcinoma with negative margins at the level of the liver resection and colonic resection.  Follow-up imaging in April and October has now demonstrated enlarging soft tissue nodules in the right retroperitoneum at the level of resection likely representing recurrent tumor. Two separate adjacent areas measure approximately 1.3 x 2.4 cm and 1.6 x 2.0 cm. A PET scan was performed on 02/28/2017 demonstrating that both of these nodular areas are hypermetabolic with SUV max of 11 and consistent with malignancy. There is no evidence of any additional metastatic disease by PET scan throughout the rest of the body.  He has now been referred back by [name] to determine if he is an ablation candidate to treat the areas of tumor recurrence.  He is not considered to be a candidate for further surgical resection per [name]. He has seen [name] in the past but has not started any systemic chemotherapy.  He remains on hemodialysis three times a week which is managed by [name].  Past Medical History:    Diagnosis Date  . Anemia   . Arthritis    "back, neck" (07/28/2014)  . Blind    both eyes removed   . Bruises easily   . CAD, NATIVE VESSEL 01/08/2008      . Cellulitis late 1980's   "hospitalized; wrapped both legs; several times; no OR for this"  . Colon polyps   . Diabetic neuropathy ([name])   . DM type 2 (diabetes mellitus, type 2) ([name])    no medications (07/28/2014) diet and excersie controlled  . ESRD (end stage renal disease) on dialysis [name], [name])    [name]; Mon, Wed, Fri (07/28/2014)  . Family history of breast cancer    mother  . Heart murmur    hx of  . History of blood product transfusion   . HYPERLIPIDEMIA-MIXED 01/08/2008  . HYPERTENSION 01/27/2009   BP low , hx of HTN, Currently on meds to raise BP  . Hypotension   . Kidney stones   . Low blood pressure   . OVERWEIGHT/OBESITY 01/08/2008   Lost 205 lbs through diet and exercise.    . Peripheral vascular disease ([place])    vein stripping  . Pneumonia 2000;s X 1  . Prostate cancer ([place])   . Prosthetic eye globe    both eyes  . Renal cell carcinoma 2001 and 2003   "both kidneys"  . Renal insufficiency     Past Surgical History:  Procedure Laterality Date  . AV FISTULA PLACEMENT Left 02/2010   LFA  . AV FISTULA PLACEMENT Left 01/12/2016   Procedure: LEFT BRACHIOCEPHALIC ARTERIOVENOUS (AV) FISTULA CREATION;  Surgeon: Judeth Cornfield  Scot Dock, MD;  Location: [place];  Service: Vascular;  Laterality: Left;  . CHOLECYSTECTOMY OPEN  1992  . COLONOSCOPY  06/14/2011   Procedure: COLONOSCOPY;  Surgeon: [name], MD;  Location: [facility];  Service: Endoscopy;  Laterality: N/A;  . COLONOSCOPY N/A 07/24/2012   Procedure: COLONOSCOPY;  Surgeon: [name], MD;  Location: [facility];  Service: Endoscopy;  Laterality: N/A;  . COLONOSCOPY    . COLONOSCOPY N/A 11/04/2015   Procedure: COLONOSCOPY;  Surgeon: Manus Gunning, MD;  Location: [facility];  Service: Gastroenterology;  Laterality: N/A;  .  COLONOSCOPY WITH PROPOFOL N/A 05/13/2014   Procedure: COLONOSCOPY WITH PROPOFOL;  Surgeon: [name], MD;  Location: [facility];  Service: Endoscopy;  Laterality: N/A;  . CORONARY ARTERY BYPASS GRAFT  09/29/2011   Procedure: CORONARY ARTERY BYPASS GRAFTING (CABG);  Surgeon: [name], MD;  Location: [facility];  Service: Open Heart Surgery;  Laterality: N/A;  Coronary Artery Bypass Graft times two utilizing the left internal mammary artery and the right greater saphenous vein harvested endoscopically.  . CYSTOSCOPY WITH RETROGRADE PYELOGRAM, URETEROSCOPY AND STENT PLACEMENT Left 07/28/2014   Procedure: CYSTOSCOPY WITH RETROGRADE PYELOGRAM, URETERAL BALLOON DILITATION, URETEROSCOPY AND LEFT STENT PLACEMENT;  Surgeon: [name], MD;  Location: [facility];  Service: Urology;  Laterality: Left;  . ENUCLEATION  2003; 2006   bilateral; "diabetes; pain"  . FOOT AMPUTATION Right ~ 2002   right;  partial; "infection"  . FRACTURE SURGERY     hip fx  . HIP PINNING,CANNULATED Left 12/31/2013   Procedure: CANNULATED HIP PINNING;  Surgeon: Renette Butters, MD;  Location: [facility];  Service: Orthopedics;  Laterality: Left;  Carm, FX Table, [employer]  . HOT HEMOSTASIS  06/14/2011   Procedure: HOT HEMOSTASIS (ARGON PLASMA COAGULATION/BICAP);  Surgeon: [name], MD;  Location: [facility];  Service: Endoscopy;  Laterality: N/A;  . INSERTION OF DIALYSIS CATHETER Right 01/12/2016   Procedure: INSERTION OF DIALYSIS CATHETER;  Surgeon: Angelia Mould, MD;  Location: [place];  Service: Vascular;  Laterality: Right;  . INSERTION PROSTATE RADIATION SEED  03/2012  . IR GENERIC HISTORICAL  01/13/2016   IR RADIOLOGIST EVAL & MGMT 01/13/2016 Aletta Edouard, MD [facility] INTERV RAD  . LAPAROTOMY N/A 04/07/2016   Procedure: EXPLORATORY LAPAROTOMY AND RESECTION OF RETROPERITONEAL MASS;  Surgeon: Nicholas Bring, MD;  Location: [facility];  Service: Urology;  Laterality: N/A;  . LEFT HEART CATHETERIZATION WITH CORONARY ANGIOGRAM  N/A 09/27/2011   Procedure: LEFT HEART CATHETERIZATION WITH CORONARY ANGIOGRAM;  Surgeon: [name], MD;  Location: [facility] C[facility] LAB;  Service: Cardiovascular;  Laterality: N/A;  . LIGATION OF ARTERIOVENOUS  FISTULA Left 01/12/2016   Procedure: LIGATION OF LEFT RADIOCEPHALIC ARTERIOVENOUS  FISTULA;  Surgeon: Angelia Mould, MD;  Location: [facility];  Service: Vascular;  Laterality: Left;  . LIGATION OF COMPETING BRANCHES OF ARTERIOVENOUS FISTULA Left 07/29/2014   Procedure: LIGATION OF COMPETING BRANCHES OF LEFT ARM ARTERIOVENOUS FISTULA;  Surgeon: [name], MD;  Location: [facility];  Service: Vascular;  Laterality: Left;  . NEPHRECTOMY Right 01/30/2015   done at [facility]  . PARTIAL COLECTOMY Right 04/07/2016   Procedure: RIGHT COLECTOMY AND PARTIAL LIVER RESECTION;  Surgeon: Nicholas Bring, MD;  Location: [facility];  Service: Urology;  Laterality: Right;  . RADIOFREQUENCY ABLATION KIDNEY  2010-2012   "twice; one on each side; for cancer"  . REVISON OF ARTERIOVENOUS FISTULA Left 09/01/2015   Procedure: EXPLORATION LEFT LOWER ARM  RADIOCEPHALIC ARTERIOVENOUS FISTULA;  Surgeon: Conrad Elbing, MD;  Location: [facility];  Service: Vascular;  Laterality: Left;  [name] VARICOSE VEIN SURGERY  mid 1980's    BLE; "knees down; both legs; 2 separate times"    Allergies: Codeine and Tape  Medications: Prior to Admission medications   Medication Sig Start Date End Date Taking? Authorizing Provider  aspirin EC 81 MG tablet Take 81 mg by mouth at bedtime.   Yes [provider]  atorvastatin (LIPITOR) 10 MG tablet Take 10 mg by mouth daily.   Yes [provider]  cinacalcet (SENSIPAR) 90 MG tablet Take 90 mg by mouth daily.   Yes [provider]  Methoxy PEG-Epoetin Beta (MIRCERA) 50 MCG/0.3ML SOSY Inject 225 mcg as directed every 14 (fourteen) days.    Yes [provider]  midodrine (PROAMATINE) 10 MG tablet Take 1 tablet (10 mg total) by mouth 3 (three) times daily with  meals. 11/05/15  Yes Reyne Dumas, MD  Probiotic Product (ALIGN) 4 MG CAPS Take 1 capsule by mouth daily.   Yes [provider]  sevelamer carbonate (RENVELA) 800 MG tablet Take 2,400-4,800 mg by mouth See admin instructions. Take 6 tablets (4800 mg) by mouth 3 times daily with meals and 3 tablets (2400 mg) 2 times daily with snacks 06/26/14  Yes [provider]  omeprazole (PRILOSEC) 40 MG capsule Take 1 capsule (40 mg total) by mouth AC breakfast. Patient not taking: Reported on 03/02/2017 02/02/17   Nicholas Ferguson, PA-C     Family History  Problem Relation Age of Onset  . Cancer Mother 27       breast, spine and ovarian  . Heart disease Mother   . Hyperlipidemia Mother   . Hypertension Mother   . Varicose Veins Mother   . Emphysema Father   . Cancer Father 84       kidney, prostate  . Heart disease Father   . Hyperlipidemia Father   . Hypertension Father   . Cancer Sister        ovarian  . Cancer Brother        lung  . Heart disease Brother   . Hyperlipidemia Brother   . Hypertension Brother   . Heart attack Brother   . Peripheral vascular disease Brother   . Cancer Unknown   . Coronary artery disease Unknown   . Kidney disease Unknown   . Breast cancer Unknown   . Ovarian cancer Unknown   . Lung cancer Unknown   . Diabetes Unknown   . Malignant hyperthermia Neg Hx     Social History   Socioeconomic History  . Marital status: Married    Spouse name: Not on file  . Number of children: Not on file  . Years of education: Not on file  . Highest education level: Not on file  Social Needs  . Financial resource strain: Not on file  . Food insecurity - worry: Not on file  . Food insecurity - inability: Not on file  . Transportation needs - medical: Not on file  . Transportation needs - non-medical: Not on file  Occupational History  . Not on file  Tobacco Use  . Smoking status: Never Smoker  . Smokeless tobacco: Never Used  Substance and Sexual  Activity  . Alcohol use: No    Alcohol/week: 0.0 oz  . Drug use: No  . Sexual activity: No  Other Topics Concern  . Not on file  Social History Narrative  . Not on file  ECOG Status: 0 - Asymptomatic  Review of Systems: A 12 point ROS discussed and pertinent positives are indicated in the HPI above.  All other systems are negative.  Review of Systems  Constitutional: Negative.   HENT: Negative.   Eyes:       Blind  Respiratory: Negative.   Cardiovascular: Negative.   Genitourinary: Negative.   Musculoskeletal: Negative.   Skin: Negative.   Neurological: Negative.     Vital Signs: BP (!) 113/58   Pulse 63   Temp 97.7 F (36.5 C)   Resp 16   Ht 6' 4"  (1.93 m)   Wt 198 lb (89.8 kg)   SpO2 99%   BMI 24.10 kg/m   Physical Exam  Constitutional: He is oriented to person, place, and time.  Cardiovascular: Normal rate, regular rhythm and normal heart sounds. Exam reveals no gallop and no friction rub.  No murmur heard. Pulmonary/Chest: Effort normal and breath sounds normal. No stridor. No respiratory distress. He has no wheezes. He has no rales.  Abdominal: Soft. Bowel sounds are normal. He exhibits no distension and no mass. There is no tenderness. There is no rebound and no guarding.  Musculoskeletal: He exhibits no edema.  Neurological: He is alert and oriented to person, place, and time.  Vitals reviewed.   Imaging: Ct Abdomen Pelvis W Contrast  Result Date: 02/01/2017 CLINICAL DATA:  Status post scratch set history of recurrent renal cell carcinoma. EXAM: CT ABDOMEN AND PELVIS WITH CONTRAST TECHNIQUE: Multidetector CT imaging of the abdomen and pelvis was performed using the standard protocol following bolus administration of intravenous contrast. CONTRAST:  147m ISOVUE-300 IOPAMIDOL (ISOVUE-300) INJECTION 61% COMPARISON:  01/10/2017 FINDINGS: Lower chest: No acute abnormality. Hepatobiliary: No focal liver abnormality. Previous cholecystectomy. Pancreas:  Unremarkable. No pancreatic ductal dilatation or surrounding inflammatory changes. Spleen: Normal in size without focal abnormality. Adrenals/Urinary Tract: Stable left adrenal nodule measuring 1.3 cm, image 29 of series 2. Two foci of recurrent tumor within the right nephrectomy bed is unchanged measuring 1.6 x 2.0 cm, image 40 of series 2. The adjacent index nodule in the nephrectomy bed is also unchanged measuring 2.3 x 1.2 cm. Left adrenal gland is unremarkable. Atrophic and multicystic left kidney stable. Urinary bladder is normal. Stomach/Bowel: Stomach is within normal limits. No evidence of bowel wall thickening, distention, or inflammatory changes. Vascular/Lymphatic: Aortic atherosclerosis. No aneurysm. No upper abdominal adenopathy. No pelvic or inguinal adenopathy. Reproductive: Seed implants noted within the prostate gland. Other: No abdominal wall hernia or abnormality. No abdominopelvic ascites. Musculoskeletal: Previous ORIF of the proximal left femur. The bones appear osteopenic. No aggressive lytic or sclerotic bone lesions. Degenerative disc disease. No aggressive lytic or sclerotic bone lesions. Degenerative disc disease noted within the lumbar spine. IMPRESSION: 1. No change from 01/10/2017. Two soft tissue attenuating nodules within the right nephrectomy bed compatible with recurrent tumor are stable. 2. No evidence for distant metastatic disease within the abdomen or pelvis. 3.  Aortic Atherosclerosis (ICD10-I70.0). Electronically Signed   By: TKerby MoorsM.D.   On: 02/01/2017 08:30   Nm Pet Image Initial (pi) Skull Base To Thigh  Result Date: 02/28/2017 CLINICAL DATA:  Initial treatment strategy for right-sided renal cell carcinoma. Recurrent renal cell carcinoma. EXAM: NUCLEAR MEDICINE PET SKULL BASE TO THIGH TECHNIQUE: 10.1 mCi F-18 FDG was injected intravenously. Full-ring PET imaging was performed from the skull base to thigh after the radiotracer. CT data was obtained and used  for attenuation correction and anatomic localization. FASTING BLOOD GLUCOSE:  Value:  92 mg/dl COMPARISON:  Abdominal CT of 01/10/2012.  Chest CT 01/20/2012. FINDINGS: NECK: Muscular activity in the neck is likely due to motion after radiopharmaceutical injection. Prostatic globes. Left carotid atherosclerosis. No cervical adenopathy. CHEST: No pulmonary parenchymal or thoracic nodal hypermetabolism. Prior median sternotomy for CABG. Other chest findings deferred to recent diagnostic CT. ABDOMEN/PELVIS: Hypermetabolism corresponding to the soft tissue nodules in the right nephrectomy bed. These measure 19 and 11 mm respectively. Hypermetabolic, at a S.U.V. max of 11.0. No abdominopelvic nodal hypermetabolism. Probable urinary contamination about the penis and left side of the scrotum. Other abdominopelvic findings deferred to recent diagnostic CT. Cholecystectomy. Left renal cortical thinning with low-density lesions which are likely cysts. Radiation seeds in the prostate. Fat containing left inguinal hernia. SKELETON: No abnormal marrow activity. Proximal left femoral fixation. Osteopenia. Presumed ankylosing spondylitis. IMPRESSION: 1. Hypermetabolic nodules within the right nephrectomy bed, most consistent with locally recurrent disease. 2. No evidence of hypermetabolic distant metastasis. Electronically Signed   By: Abigail Miyamoto M.D.   On: 02/28/2017 12:25    Labs:  CBC: Recent Labs    04/09/16 0321 04/10/16 0733 04/11/16 0439 04/12/16 0657  WBC 5.6 3.6* 4.0 3.5*  HGB 8.6* 8.1* 8.2* 8.0*  HCT 25.2* 23.6* 24.2* 24.0*  PLT 89* 73* 91* 91*    COAGS: Recent Labs    04/08/16 0409  INR 1.04    BMP: Recent Labs    04/09/16 0341 04/10/16 0733 04/11/16 0439 04/12/16 0657  NA 134* 138 136 136  K 4.5 3.6 3.6 4.4  CL 96* 101 98* 100*  CO2 24 27 26 28   GLUCOSE 79 97 114* 89  BUN 73* 40* 49* 23*  CALCIUM 8.4* 8.5* 8.6* 8.5*  CREATININE 7.25* 5.14* 6.59* 4.56*  GFRNONAA 7* 11* 8* 12*    GFRAA 8* 12* 9* 14*    LIVER FUNCTION TESTS: Recent Labs    04/08/16 0409  BILITOT 0.4  AST 73*  ALT 67*  ALKPHOS 103  PROT 5.4*  ALBUMIN 3.7    Assessment and Plan:  I met with [name] and his wife. We reviewed CT scan results as well as the recent PET scan from 2 days ago.  Both areas of soft tissue nodularity in the right retroperitoneum are clearly hypermetabolic and consistent with malignancy. They lie adjacent to one another and over a region of just over 4.5 cm in greatest length. I told [name] that this region should be amenable to percutaneous ablation. I would favor thermal ablation over cryoablation due to the fact that this represents recurrence and anticipation of significant scar tissue due to prior ablation and 2 separate major surgical resections.   There is a segment of duodenum that lies in fairly close proximity to the anterior margin of the more medial area of tumor recurrence. This should be able to be displaced by hydrodissection during ablation. After discussion, [name] would like to schedule an ablation procedure. We will go ahead with the scheduling process. Due to an extremely busy December schedule he will be scheduled in January.   Electronically SignedAletta Edouard T 03/02/2017, 9:22 AM     I spent a total of 25 Minutes in face to face in clinical consultation, greater than 50% of which was counseling/coordinating care for ablation of right renal carcinoma recurrence.

## 2017-03-03 DIAGNOSIS — Z992 Dependence on renal dialysis: Secondary | ICD-10-CM | POA: Diagnosis not present

## 2017-03-03 DIAGNOSIS — N2581 Secondary hyperparathyroidism of renal origin: Secondary | ICD-10-CM | POA: Diagnosis not present

## 2017-03-03 DIAGNOSIS — D509 Iron deficiency anemia, unspecified: Secondary | ICD-10-CM | POA: Diagnosis not present

## 2017-03-03 DIAGNOSIS — D631 Anemia in chronic kidney disease: Secondary | ICD-10-CM | POA: Diagnosis not present

## 2017-03-03 DIAGNOSIS — E119 Type 2 diabetes mellitus without complications: Secondary | ICD-10-CM | POA: Diagnosis not present

## 2017-03-03 DIAGNOSIS — N186 End stage renal disease: Secondary | ICD-10-CM | POA: Diagnosis not present

## 2017-03-03 DIAGNOSIS — E1129 Type 2 diabetes mellitus with other diabetic kidney complication: Secondary | ICD-10-CM | POA: Diagnosis not present

## 2017-03-03 DIAGNOSIS — K7689 Other specified diseases of liver: Secondary | ICD-10-CM | POA: Diagnosis not present

## 2017-03-06 DIAGNOSIS — N2581 Secondary hyperparathyroidism of renal origin: Secondary | ICD-10-CM | POA: Diagnosis not present

## 2017-03-06 DIAGNOSIS — K7689 Other specified diseases of liver: Secondary | ICD-10-CM | POA: Diagnosis not present

## 2017-03-06 DIAGNOSIS — E119 Type 2 diabetes mellitus without complications: Secondary | ICD-10-CM | POA: Diagnosis not present

## 2017-03-06 DIAGNOSIS — D631 Anemia in chronic kidney disease: Secondary | ICD-10-CM | POA: Diagnosis not present

## 2017-03-06 DIAGNOSIS — D509 Iron deficiency anemia, unspecified: Secondary | ICD-10-CM | POA: Diagnosis not present

## 2017-03-06 DIAGNOSIS — N186 End stage renal disease: Secondary | ICD-10-CM | POA: Diagnosis not present

## 2017-03-06 DIAGNOSIS — Z23 Encounter for immunization: Secondary | ICD-10-CM | POA: Diagnosis not present

## 2017-03-08 DIAGNOSIS — D631 Anemia in chronic kidney disease: Secondary | ICD-10-CM | POA: Diagnosis not present

## 2017-03-08 DIAGNOSIS — K7689 Other specified diseases of liver: Secondary | ICD-10-CM | POA: Diagnosis not present

## 2017-03-08 DIAGNOSIS — D509 Iron deficiency anemia, unspecified: Secondary | ICD-10-CM | POA: Diagnosis not present

## 2017-03-08 DIAGNOSIS — N186 End stage renal disease: Secondary | ICD-10-CM | POA: Diagnosis not present

## 2017-03-08 DIAGNOSIS — N2581 Secondary hyperparathyroidism of renal origin: Secondary | ICD-10-CM | POA: Diagnosis not present

## 2017-03-08 DIAGNOSIS — E119 Type 2 diabetes mellitus without complications: Secondary | ICD-10-CM | POA: Diagnosis not present

## 2017-03-10 DIAGNOSIS — K7689 Other specified diseases of liver: Secondary | ICD-10-CM | POA: Diagnosis not present

## 2017-03-10 DIAGNOSIS — D631 Anemia in chronic kidney disease: Secondary | ICD-10-CM | POA: Diagnosis not present

## 2017-03-10 DIAGNOSIS — N186 End stage renal disease: Secondary | ICD-10-CM | POA: Diagnosis not present

## 2017-03-10 DIAGNOSIS — D509 Iron deficiency anemia, unspecified: Secondary | ICD-10-CM | POA: Diagnosis not present

## 2017-03-10 DIAGNOSIS — E119 Type 2 diabetes mellitus without complications: Secondary | ICD-10-CM | POA: Diagnosis not present

## 2017-03-10 DIAGNOSIS — N2581 Secondary hyperparathyroidism of renal origin: Secondary | ICD-10-CM | POA: Diagnosis not present

## 2017-03-13 DIAGNOSIS — E119 Type 2 diabetes mellitus without complications: Secondary | ICD-10-CM | POA: Diagnosis not present

## 2017-03-13 DIAGNOSIS — D631 Anemia in chronic kidney disease: Secondary | ICD-10-CM | POA: Diagnosis not present

## 2017-03-13 DIAGNOSIS — N2581 Secondary hyperparathyroidism of renal origin: Secondary | ICD-10-CM | POA: Diagnosis not present

## 2017-03-13 DIAGNOSIS — K7689 Other specified diseases of liver: Secondary | ICD-10-CM | POA: Diagnosis not present

## 2017-03-13 DIAGNOSIS — D509 Iron deficiency anemia, unspecified: Secondary | ICD-10-CM | POA: Diagnosis not present

## 2017-03-13 DIAGNOSIS — N186 End stage renal disease: Secondary | ICD-10-CM | POA: Diagnosis not present

## 2017-03-14 ENCOUNTER — Other Ambulatory Visit: Payer: Self-pay | Admitting: Interventional Radiology

## 2017-03-14 DIAGNOSIS — C642 Malignant neoplasm of left kidney, except renal pelvis: Secondary | ICD-10-CM

## 2017-03-14 DIAGNOSIS — C641 Malignant neoplasm of right kidney, except renal pelvis: Secondary | ICD-10-CM

## 2017-03-15 DIAGNOSIS — K7689 Other specified diseases of liver: Secondary | ICD-10-CM | POA: Diagnosis not present

## 2017-03-15 DIAGNOSIS — D631 Anemia in chronic kidney disease: Secondary | ICD-10-CM | POA: Diagnosis not present

## 2017-03-15 DIAGNOSIS — D509 Iron deficiency anemia, unspecified: Secondary | ICD-10-CM | POA: Diagnosis not present

## 2017-03-15 DIAGNOSIS — N186 End stage renal disease: Secondary | ICD-10-CM | POA: Diagnosis not present

## 2017-03-15 DIAGNOSIS — E119 Type 2 diabetes mellitus without complications: Secondary | ICD-10-CM | POA: Diagnosis not present

## 2017-03-15 DIAGNOSIS — N2581 Secondary hyperparathyroidism of renal origin: Secondary | ICD-10-CM | POA: Diagnosis not present

## 2017-03-17 DIAGNOSIS — D509 Iron deficiency anemia, unspecified: Secondary | ICD-10-CM | POA: Diagnosis not present

## 2017-03-17 DIAGNOSIS — D631 Anemia in chronic kidney disease: Secondary | ICD-10-CM | POA: Diagnosis not present

## 2017-03-17 DIAGNOSIS — N186 End stage renal disease: Secondary | ICD-10-CM | POA: Diagnosis not present

## 2017-03-17 DIAGNOSIS — E119 Type 2 diabetes mellitus without complications: Secondary | ICD-10-CM | POA: Diagnosis not present

## 2017-03-17 DIAGNOSIS — K7689 Other specified diseases of liver: Secondary | ICD-10-CM | POA: Diagnosis not present

## 2017-03-17 DIAGNOSIS — N2581 Secondary hyperparathyroidism of renal origin: Secondary | ICD-10-CM | POA: Diagnosis not present

## 2017-03-20 DIAGNOSIS — K7689 Other specified diseases of liver: Secondary | ICD-10-CM | POA: Diagnosis not present

## 2017-03-20 DIAGNOSIS — D509 Iron deficiency anemia, unspecified: Secondary | ICD-10-CM | POA: Diagnosis not present

## 2017-03-20 DIAGNOSIS — N2581 Secondary hyperparathyroidism of renal origin: Secondary | ICD-10-CM | POA: Diagnosis not present

## 2017-03-20 DIAGNOSIS — D631 Anemia in chronic kidney disease: Secondary | ICD-10-CM | POA: Diagnosis not present

## 2017-03-20 DIAGNOSIS — N186 End stage renal disease: Secondary | ICD-10-CM | POA: Diagnosis not present

## 2017-03-20 DIAGNOSIS — E119 Type 2 diabetes mellitus without complications: Secondary | ICD-10-CM | POA: Diagnosis not present

## 2017-03-22 DIAGNOSIS — E119 Type 2 diabetes mellitus without complications: Secondary | ICD-10-CM | POA: Diagnosis not present

## 2017-03-22 DIAGNOSIS — D631 Anemia in chronic kidney disease: Secondary | ICD-10-CM | POA: Diagnosis not present

## 2017-03-22 DIAGNOSIS — D509 Iron deficiency anemia, unspecified: Secondary | ICD-10-CM | POA: Diagnosis not present

## 2017-03-22 DIAGNOSIS — K7689 Other specified diseases of liver: Secondary | ICD-10-CM | POA: Diagnosis not present

## 2017-03-22 DIAGNOSIS — N186 End stage renal disease: Secondary | ICD-10-CM | POA: Diagnosis not present

## 2017-03-22 DIAGNOSIS — N2581 Secondary hyperparathyroidism of renal origin: Secondary | ICD-10-CM | POA: Diagnosis not present

## 2017-03-23 ENCOUNTER — Other Ambulatory Visit: Payer: Self-pay | Admitting: Radiology

## 2017-03-23 NOTE — Progress Notes (Signed)
Need orders in epic for 04-18-17 surgery, pre op is 04-18-17

## 2017-03-24 ENCOUNTER — Other Ambulatory Visit: Payer: Self-pay | Admitting: Radiology

## 2017-03-24 DIAGNOSIS — D509 Iron deficiency anemia, unspecified: Secondary | ICD-10-CM | POA: Diagnosis not present

## 2017-03-24 DIAGNOSIS — D631 Anemia in chronic kidney disease: Secondary | ICD-10-CM | POA: Diagnosis not present

## 2017-03-24 DIAGNOSIS — K7689 Other specified diseases of liver: Secondary | ICD-10-CM | POA: Diagnosis not present

## 2017-03-24 DIAGNOSIS — N2581 Secondary hyperparathyroidism of renal origin: Secondary | ICD-10-CM | POA: Diagnosis not present

## 2017-03-24 DIAGNOSIS — E119 Type 2 diabetes mellitus without complications: Secondary | ICD-10-CM | POA: Diagnosis not present

## 2017-03-24 DIAGNOSIS — N186 End stage renal disease: Secondary | ICD-10-CM | POA: Diagnosis not present

## 2017-03-26 DIAGNOSIS — D631 Anemia in chronic kidney disease: Secondary | ICD-10-CM | POA: Diagnosis not present

## 2017-03-26 DIAGNOSIS — N2581 Secondary hyperparathyroidism of renal origin: Secondary | ICD-10-CM | POA: Diagnosis not present

## 2017-03-26 DIAGNOSIS — N186 End stage renal disease: Secondary | ICD-10-CM | POA: Diagnosis not present

## 2017-03-26 DIAGNOSIS — E119 Type 2 diabetes mellitus without complications: Secondary | ICD-10-CM | POA: Diagnosis not present

## 2017-03-26 DIAGNOSIS — D509 Iron deficiency anemia, unspecified: Secondary | ICD-10-CM | POA: Diagnosis not present

## 2017-03-26 DIAGNOSIS — K7689 Other specified diseases of liver: Secondary | ICD-10-CM | POA: Diagnosis not present

## 2017-03-29 DIAGNOSIS — D631 Anemia in chronic kidney disease: Secondary | ICD-10-CM | POA: Diagnosis not present

## 2017-03-29 DIAGNOSIS — D509 Iron deficiency anemia, unspecified: Secondary | ICD-10-CM | POA: Diagnosis not present

## 2017-03-29 DIAGNOSIS — K7689 Other specified diseases of liver: Secondary | ICD-10-CM | POA: Diagnosis not present

## 2017-03-29 DIAGNOSIS — E119 Type 2 diabetes mellitus without complications: Secondary | ICD-10-CM | POA: Diagnosis not present

## 2017-03-29 DIAGNOSIS — N186 End stage renal disease: Secondary | ICD-10-CM | POA: Diagnosis not present

## 2017-03-29 DIAGNOSIS — N2581 Secondary hyperparathyroidism of renal origin: Secondary | ICD-10-CM | POA: Diagnosis not present

## 2017-03-31 DIAGNOSIS — D509 Iron deficiency anemia, unspecified: Secondary | ICD-10-CM | POA: Diagnosis not present

## 2017-03-31 DIAGNOSIS — N2581 Secondary hyperparathyroidism of renal origin: Secondary | ICD-10-CM | POA: Diagnosis not present

## 2017-03-31 DIAGNOSIS — N186 End stage renal disease: Secondary | ICD-10-CM | POA: Diagnosis not present

## 2017-03-31 DIAGNOSIS — D631 Anemia in chronic kidney disease: Secondary | ICD-10-CM | POA: Diagnosis not present

## 2017-03-31 DIAGNOSIS — K7689 Other specified diseases of liver: Secondary | ICD-10-CM | POA: Diagnosis not present

## 2017-03-31 DIAGNOSIS — E119 Type 2 diabetes mellitus without complications: Secondary | ICD-10-CM | POA: Diagnosis not present

## 2017-04-02 DIAGNOSIS — D631 Anemia in chronic kidney disease: Secondary | ICD-10-CM | POA: Diagnosis not present

## 2017-04-02 DIAGNOSIS — D509 Iron deficiency anemia, unspecified: Secondary | ICD-10-CM | POA: Diagnosis not present

## 2017-04-02 DIAGNOSIS — N2581 Secondary hyperparathyroidism of renal origin: Secondary | ICD-10-CM | POA: Diagnosis not present

## 2017-04-02 DIAGNOSIS — K7689 Other specified diseases of liver: Secondary | ICD-10-CM | POA: Diagnosis not present

## 2017-04-02 DIAGNOSIS — E119 Type 2 diabetes mellitus without complications: Secondary | ICD-10-CM | POA: Diagnosis not present

## 2017-04-02 DIAGNOSIS — N186 End stage renal disease: Secondary | ICD-10-CM | POA: Diagnosis not present

## 2017-04-03 DIAGNOSIS — N186 End stage renal disease: Secondary | ICD-10-CM | POA: Diagnosis not present

## 2017-04-03 DIAGNOSIS — E1129 Type 2 diabetes mellitus with other diabetic kidney complication: Secondary | ICD-10-CM | POA: Diagnosis not present

## 2017-04-03 DIAGNOSIS — Z992 Dependence on renal dialysis: Secondary | ICD-10-CM | POA: Diagnosis not present

## 2017-04-05 DIAGNOSIS — K7689 Other specified diseases of liver: Secondary | ICD-10-CM | POA: Diagnosis not present

## 2017-04-05 DIAGNOSIS — N2581 Secondary hyperparathyroidism of renal origin: Secondary | ICD-10-CM | POA: Diagnosis not present

## 2017-04-05 DIAGNOSIS — E119 Type 2 diabetes mellitus without complications: Secondary | ICD-10-CM | POA: Diagnosis not present

## 2017-04-05 DIAGNOSIS — Z23 Encounter for immunization: Secondary | ICD-10-CM | POA: Diagnosis not present

## 2017-04-05 DIAGNOSIS — N186 End stage renal disease: Secondary | ICD-10-CM | POA: Diagnosis not present

## 2017-04-05 DIAGNOSIS — D631 Anemia in chronic kidney disease: Secondary | ICD-10-CM | POA: Diagnosis not present

## 2017-04-05 DIAGNOSIS — D509 Iron deficiency anemia, unspecified: Secondary | ICD-10-CM | POA: Diagnosis not present

## 2017-04-07 DIAGNOSIS — D631 Anemia in chronic kidney disease: Secondary | ICD-10-CM | POA: Diagnosis not present

## 2017-04-07 DIAGNOSIS — E119 Type 2 diabetes mellitus without complications: Secondary | ICD-10-CM | POA: Diagnosis not present

## 2017-04-07 DIAGNOSIS — N186 End stage renal disease: Secondary | ICD-10-CM | POA: Diagnosis not present

## 2017-04-07 DIAGNOSIS — D509 Iron deficiency anemia, unspecified: Secondary | ICD-10-CM | POA: Diagnosis not present

## 2017-04-07 DIAGNOSIS — K7689 Other specified diseases of liver: Secondary | ICD-10-CM | POA: Diagnosis not present

## 2017-04-07 DIAGNOSIS — N2581 Secondary hyperparathyroidism of renal origin: Secondary | ICD-10-CM | POA: Diagnosis not present

## 2017-04-10 DIAGNOSIS — N2581 Secondary hyperparathyroidism of renal origin: Secondary | ICD-10-CM | POA: Diagnosis not present

## 2017-04-10 DIAGNOSIS — D631 Anemia in chronic kidney disease: Secondary | ICD-10-CM | POA: Diagnosis not present

## 2017-04-10 DIAGNOSIS — E119 Type 2 diabetes mellitus without complications: Secondary | ICD-10-CM | POA: Diagnosis not present

## 2017-04-10 DIAGNOSIS — K7689 Other specified diseases of liver: Secondary | ICD-10-CM | POA: Diagnosis not present

## 2017-04-10 DIAGNOSIS — D509 Iron deficiency anemia, unspecified: Secondary | ICD-10-CM | POA: Diagnosis not present

## 2017-04-10 DIAGNOSIS — N186 End stage renal disease: Secondary | ICD-10-CM | POA: Diagnosis not present

## 2017-04-12 DIAGNOSIS — D631 Anemia in chronic kidney disease: Secondary | ICD-10-CM | POA: Diagnosis not present

## 2017-04-12 DIAGNOSIS — N2581 Secondary hyperparathyroidism of renal origin: Secondary | ICD-10-CM | POA: Diagnosis not present

## 2017-04-12 DIAGNOSIS — K7689 Other specified diseases of liver: Secondary | ICD-10-CM | POA: Diagnosis not present

## 2017-04-12 DIAGNOSIS — N186 End stage renal disease: Secondary | ICD-10-CM | POA: Diagnosis not present

## 2017-04-12 DIAGNOSIS — E1129 Type 2 diabetes mellitus with other diabetic kidney complication: Secondary | ICD-10-CM | POA: Diagnosis not present

## 2017-04-12 DIAGNOSIS — E119 Type 2 diabetes mellitus without complications: Secondary | ICD-10-CM | POA: Diagnosis not present

## 2017-04-12 DIAGNOSIS — D509 Iron deficiency anemia, unspecified: Secondary | ICD-10-CM | POA: Diagnosis not present

## 2017-04-14 ENCOUNTER — Encounter (HOSPITAL_COMMUNITY): Payer: Self-pay

## 2017-04-14 DIAGNOSIS — N2581 Secondary hyperparathyroidism of renal origin: Secondary | ICD-10-CM | POA: Diagnosis not present

## 2017-04-14 DIAGNOSIS — N186 End stage renal disease: Secondary | ICD-10-CM | POA: Diagnosis not present

## 2017-04-14 DIAGNOSIS — K7689 Other specified diseases of liver: Secondary | ICD-10-CM | POA: Diagnosis not present

## 2017-04-14 DIAGNOSIS — D509 Iron deficiency anemia, unspecified: Secondary | ICD-10-CM | POA: Diagnosis not present

## 2017-04-14 DIAGNOSIS — E119 Type 2 diabetes mellitus without complications: Secondary | ICD-10-CM | POA: Diagnosis not present

## 2017-04-14 DIAGNOSIS — D631 Anemia in chronic kidney disease: Secondary | ICD-10-CM | POA: Diagnosis not present

## 2017-04-14 NOTE — Patient Instructions (Addendum)
[name]  04/14/2017   Your procedure is scheduled on: 04/19/17   Report to [facility] Main  Entrance  Report to Admitting,  Check in at Radiology then you will be taken to Short Stay   Call this number if you have problems the morning of surgery [phone]   Remember: Do not eat food or drink liquids :After Midnight.     Take these medicines the morning of surgery with A SIP OF WATER: none                                 You may not have any metal on your body including hair pins and              piercings  Do not wear jewelry,  lotions, powders or perfumes, deodorant                         Men may shave face and neck.   Do not bring valuables to the hospital. [place].  Contacts, dentures or bridgework may not be worn into surgery.  Leave suitcase in the car. After surgery it may be brought to your room.                 Please read over the following fact sheets you were given: _____________________________________________________________________             [facility] - Preparing for Surgery Before surgery, you can play an important role.  Because skin is not sterile, your skin needs to be as free of germs as possible.  You can reduce the number of germs on your skin by washing with CHG (chlorahexidine gluconate) soap before surgery.  CHG is an antiseptic cleaner which kills germs and bonds with the skin to continue killing germs even after washing. Please DO NOT use if you have an allergy to CHG or antibacterial soaps.  If your skin becomes reddened/irritated stop using the CHG and inform your nurse when you arrive at Short Stay. Do not shave (including legs and underarms) for at least 48 hours prior to the first CHG shower.  You may shave your face/neck. Please follow these instructions carefully:  1.  Shower with CHG Soap the night before surgery and the  morning of Surgery.  2.  If you  choose to wash your hair, wash your hair first as usual with your  normal  shampoo.  3.  After you shampoo, rinse your hair and body thoroughly to remove the  shampoo.                           4.  Use CHG as you would any other liquid soap.  You can apply chg directly  to the skin and wash                       Gently with a scrungie or clean washcloth.  5.  Apply the CHG Soap to your body ONLY FROM THE NECK DOWN.   Do not use on face/ open  Wound or open sores. Avoid contact with eyes, ears mouth and genitals (private parts).                       Wash face,  Genitals (private parts) with your normal soap.             6.  Wash thoroughly, paying special attention to the area where your surgery  will be performed.  7.  Thoroughly rinse your body with warm water from the neck down.  8.  DO NOT shower/wash with your normal soap after using and rinsing off  the CHG Soap.                9.  Pat yourself dry with a clean towel.            10.  Wear clean pajamas.            11.  Place clean sheets on your bed the night of your first shower and do not  sleep with pets. Day of Surgery : Do not apply any lotions/deodorants the morning of surgery.  Please wear clean clothes to the hospital/surgery center.  FAILURE TO FOLLOW THESE INSTRUCTIONS MAY RESULT IN THE CANCELLATION OF YOUR SURGERY PATIENT SIGNATURE_________________________________  NURSE SIGNATURE__________________________________  ________________________________________________________________________  WHAT IS A BLOOD TRANSFUSION? Blood Transfusion Information  A transfusion is the replacement of blood or some of its parts. Blood is made up of multiple cells which provide different functions.  Red blood cells carry oxygen and are used for blood loss replacement.  White blood cells fight against infection.  Platelets control bleeding.  Plasma helps clot blood.  Other blood products are available for  specialized needs, such as hemophilia or other clotting disorders. BEFORE THE TRANSFUSION  Who gives blood for transfusions?   Healthy volunteers who are fully evaluated to make sure their blood is safe. This is blood bank blood. Transfusion therapy is the safest it has ever been in the practice of medicine. Before blood is taken from a donor, a complete history is taken to make sure that person has no history of diseases nor engages in risky social behavior (examples are intravenous drug use or sexual activity with multiple partners). The donor's travel history is screened to minimize risk of transmitting infections, such as malaria. The donated blood is tested for signs of infectious diseases, such as HIV and hepatitis. The blood is then tested to be sure it is compatible with you in order to minimize the chance of a transfusion reaction. If you or a relative donates blood, this is often done in anticipation of surgery and is not appropriate for emergency situations. It takes many days to process the donated blood. RISKS AND COMPLICATIONS Although transfusion therapy is very safe and saves many lives, the main dangers of transfusion include:   Getting an infectious disease.  Developing a transfusion reaction. This is an allergic reaction to something in the blood you were given. Every precaution is taken to prevent this. The decision to have a blood transfusion has been considered carefully by your caregiver before blood is given. Blood is not given unless the benefits outweigh the risks. AFTER THE TRANSFUSION  Right after receiving a blood transfusion, you will usually feel much better and more energetic. This is especially true if your red blood cells have gotten low (anemic). The transfusion raises the level of the red blood cells which carry oxygen, and this usually causes an energy increase.  The  nurse administering the transfusion will monitor you carefully for complications. HOME CARE  INSTRUCTIONS  No special instructions are needed after a transfusion. You may find your energy is better. Speak with your caregiver about any limitations on activity for underlying diseases you may have. SEEK MEDICAL CARE IF:   Your condition is not improving after your transfusion.  You develop redness or irritation at the intravenous (IV) site. SEEK IMMEDIATE MEDICAL CARE IF:  Any of the following symptoms occur over the next 12 hours:  Shaking chills.  You have a temperature by mouth above 102 F (38.9 C), not controlled by medicine.  Chest, back, or muscle pain.  People around you feel you are not acting correctly or are confused.  Shortness of breath or difficulty breathing.  Dizziness and fainting.  You get a rash or develop hives.  You have a decrease in urine output.  Your urine turns a dark color or changes to pink, red, or brown. Any of the following symptoms occur over the next 10 days:  You have a temperature by mouth above 102 F (38.9 C), not controlled by medicine.  Shortness of breath.  Weakness after normal activity.  The white part of the eye turns yellow (jaundice).  You have a decrease in the amount of urine or are urinating less often.  Your urine turns a dark color or changes to pink, red, or brown. Document Released: 03/18/2000 Document Revised: 06/13/2011 Document Reviewed: 11/05/2007 [facility] Patient Information 2014 [place], [place].  _______________________________________________________________________

## 2017-04-17 ENCOUNTER — Other Ambulatory Visit: Payer: Self-pay | Admitting: Radiology

## 2017-04-17 DIAGNOSIS — D509 Iron deficiency anemia, unspecified: Secondary | ICD-10-CM | POA: Diagnosis not present

## 2017-04-17 DIAGNOSIS — N186 End stage renal disease: Secondary | ICD-10-CM | POA: Diagnosis not present

## 2017-04-17 DIAGNOSIS — K7689 Other specified diseases of liver: Secondary | ICD-10-CM | POA: Diagnosis not present

## 2017-04-17 DIAGNOSIS — N2581 Secondary hyperparathyroidism of renal origin: Secondary | ICD-10-CM | POA: Diagnosis not present

## 2017-04-17 DIAGNOSIS — D631 Anemia in chronic kidney disease: Secondary | ICD-10-CM | POA: Diagnosis not present

## 2017-04-17 DIAGNOSIS — E119 Type 2 diabetes mellitus without complications: Secondary | ICD-10-CM | POA: Diagnosis not present

## 2017-04-18 ENCOUNTER — Encounter (HOSPITAL_COMMUNITY): Payer: Self-pay

## 2017-04-18 ENCOUNTER — Other Ambulatory Visit: Payer: Self-pay | Admitting: Radiology

## 2017-04-18 ENCOUNTER — Other Ambulatory Visit: Payer: Self-pay

## 2017-04-18 ENCOUNTER — Encounter (HOSPITAL_COMMUNITY)
Admission: RE | Admit: 2017-04-18 | Discharge: 2017-04-18 | Disposition: A | Discharge status: Home / Self Care | Payer: Medicare Other | Source: Ambulatory Visit | Attending: Interventional Radiology | Admitting: Interventional Radiology

## 2017-04-18 ENCOUNTER — Ambulatory Visit (HOSPITAL_COMMUNITY)
Admission: RE | Admit: 2017-04-18 | Discharge: 2017-04-18 | Disposition: A | Discharge status: Home / Self Care | Payer: Medicare Other | Source: Ambulatory Visit | Attending: Radiology | Admitting: Radiology

## 2017-04-18 DIAGNOSIS — Z01818 Encounter for other preprocedural examination: Secondary | ICD-10-CM | POA: Insufficient documentation

## 2017-04-18 DIAGNOSIS — Z01812 Encounter for preprocedural laboratory examination: Secondary | ICD-10-CM | POA: Diagnosis not present

## 2017-04-18 DIAGNOSIS — C649 Malignant neoplasm of unspecified kidney, except renal pelvis: Secondary | ICD-10-CM | POA: Insufficient documentation

## 2017-04-18 DIAGNOSIS — Z01810 Encounter for preprocedural cardiovascular examination: Secondary | ICD-10-CM | POA: Insufficient documentation

## 2017-04-18 DIAGNOSIS — R9431 Abnormal electrocardiogram [ECG] [EKG]: Secondary | ICD-10-CM | POA: Diagnosis not present

## 2017-04-18 HISTORY — DX: Cardiac arrhythmia, unspecified: I49.9

## 2017-04-18 HISTORY — DX: Unspecified fall, initial encounter: W19.XXXA

## 2017-04-18 NOTE — Anesthesia Preprocedure Evaluation (Addendum)
Anesthesia Evaluation  Patient identified by MRN, date of birth, ID band Patient awake    Reviewed: Allergy & Precautions, NPO status , Patient's Chart, lab work & pertinent test results  Airway Mallampati: II  TM Distance: >3 FB Neck ROM: Full    Dental  (+) Teeth Intact, Dental Advisory Given   Pulmonary neg pneumonia ,    breath sounds clear to auscultation       Cardiovascular hypertension, + CAD, + CABG and + Peripheral Vascular Disease  + dysrhythmias  Rhythm:Regular Rate:Normal  Echo in 2015 with EF 55%   Neuro/Psych PSYCHIATRIC DISORDERS Depression negative neurological ROS     GI/Hepatic negative GI ROS, Neg liver ROS,   Endo/Other  diabetes, Type 2, Insulin Dependent  Renal/GU Dialysis and ESRFRenal disease  negative genitourinary   Musculoskeletal  (+) Arthritis ,   Abdominal   Peds negative pediatric ROS (+)  Hematology  (+) anemia , Hgb 8.2   Anesthesia Other Findings - Blind (both eyes) - HLD Echo 18'  Left ventricle:  The cavity size was normal. Wall thickness was increased in a pattern of moderate LVH. Systolic function was normal. The estimated ejection fraction was in the range of 60% to 65%. Wall motion was normal; there were no regional wall motion abnormalities.   Reproductive/Obstetrics negative OB ROS                            Anesthesia Physical  Anesthesia Plan  ASA: IV  Anesthesia Plan: General   Post-op Pain Management:    Induction: Intravenous  PONV Risk Score and Plan: 1 and Treatment may vary due to age or medical condition and Ondansetron  Airway Management Planned: Oral ETT and LMA  Additional Equipment:   Intra-op Plan:   Post-operative Plan: Extubation in OR  Informed Consent: I have reviewed the patients History and Physical, chart, labs and discussed the procedure including the risks, benefits and alternatives for the proposed  anesthesia with the patient or authorized representative who has indicated his/her understanding and acceptance.   Dental advisory given  Plan Discussed with: CRNA, Anesthesiologist and Surgeon  Anesthesia Plan Comments: (  )       Anesthesia Quick Evaluation

## 2017-04-18 NOTE — Progress Notes (Signed)
LOV-12/02/16-cardiology-epic 04/08/16-echo-epic 08/09/16-ekg-epic and on chart

## 2017-04-18 NOTE — Progress Notes (Signed)
Dr Myrtie Soman ( anesthesia) made aware of EKG done 04/18/17 with comparison to 08/2016 EKG.  Along with history.  No new orders given.

## 2017-04-19 ENCOUNTER — Ambulatory Visit (HOSPITAL_COMMUNITY)
Admission: RE | Admit: 2017-04-19 | Discharge: 2017-04-19 | Disposition: A | Discharge status: Home / Self Care | Payer: Medicare Other | Source: Ambulatory Visit | Attending: Interventional Radiology | Admitting: Interventional Radiology

## 2017-04-19 ENCOUNTER — Encounter (HOSPITAL_COMMUNITY): Payer: Self-pay | Admitting: Anesthesiology

## 2017-04-19 ENCOUNTER — Encounter (HOSPITAL_COMMUNITY)
Admission: RE | Disposition: A | Discharge status: Home / Self Care | Payer: Self-pay | Source: Ambulatory Visit | Attending: Interventional Radiology

## 2017-04-19 ENCOUNTER — Ambulatory Visit (HOSPITAL_COMMUNITY): Payer: Medicare Other | Admitting: Anesthesiology

## 2017-04-19 ENCOUNTER — Observation Stay (HOSPITAL_COMMUNITY)
Admission: RE | Admit: 2017-04-19 | Discharge: 2017-04-20 | Disposition: A | Discharge status: Home / Self Care | Payer: Medicare Other | Source: Ambulatory Visit | Attending: Interventional Radiology | Admitting: Interventional Radiology

## 2017-04-19 ENCOUNTER — Encounter (HOSPITAL_COMMUNITY): Payer: Self-pay

## 2017-04-19 ENCOUNTER — Other Ambulatory Visit: Payer: Self-pay

## 2017-04-19 DIAGNOSIS — Z888 Allergy status to other drugs, medicaments and biological substances status: Secondary | ICD-10-CM | POA: Diagnosis not present

## 2017-04-19 DIAGNOSIS — E1151 Type 2 diabetes mellitus with diabetic peripheral angiopathy without gangrene: Secondary | ICD-10-CM | POA: Diagnosis not present

## 2017-04-19 DIAGNOSIS — Z885 Allergy status to narcotic agent status: Secondary | ICD-10-CM | POA: Insufficient documentation

## 2017-04-19 DIAGNOSIS — H548 Legal blindness, as defined in USA: Secondary | ICD-10-CM | POA: Diagnosis not present

## 2017-04-19 DIAGNOSIS — N186 End stage renal disease: Secondary | ICD-10-CM | POA: Diagnosis not present

## 2017-04-19 DIAGNOSIS — I4891 Unspecified atrial fibrillation: Secondary | ICD-10-CM | POA: Insufficient documentation

## 2017-04-19 DIAGNOSIS — Z794 Long term (current) use of insulin: Secondary | ICD-10-CM | POA: Insufficient documentation

## 2017-04-19 DIAGNOSIS — E782 Mixed hyperlipidemia: Secondary | ICD-10-CM | POA: Insufficient documentation

## 2017-04-19 DIAGNOSIS — Z992 Dependence on renal dialysis: Secondary | ICD-10-CM | POA: Diagnosis not present

## 2017-04-19 DIAGNOSIS — E1140 Type 2 diabetes mellitus with diabetic neuropathy, unspecified: Secondary | ICD-10-CM | POA: Diagnosis not present

## 2017-04-19 DIAGNOSIS — C641 Malignant neoplasm of right kidney, except renal pelvis: Principal | ICD-10-CM | POA: Diagnosis present

## 2017-04-19 DIAGNOSIS — F329 Major depressive disorder, single episode, unspecified: Secondary | ICD-10-CM | POA: Insufficient documentation

## 2017-04-19 DIAGNOSIS — Z905 Acquired absence of kidney: Secondary | ICD-10-CM | POA: Insufficient documentation

## 2017-04-19 DIAGNOSIS — C642 Malignant neoplasm of left kidney, except renal pelvis: Secondary | ICD-10-CM

## 2017-04-19 DIAGNOSIS — I120 Hypertensive chronic kidney disease with stage 5 chronic kidney disease or end stage renal disease: Secondary | ICD-10-CM | POA: Insufficient documentation

## 2017-04-19 DIAGNOSIS — M47819 Spondylosis without myelopathy or radiculopathy, site unspecified: Secondary | ICD-10-CM | POA: Diagnosis not present

## 2017-04-19 DIAGNOSIS — M47812 Spondylosis without myelopathy or radiculopathy, cervical region: Secondary | ICD-10-CM | POA: Insufficient documentation

## 2017-04-19 DIAGNOSIS — C61 Malignant neoplasm of prostate: Secondary | ICD-10-CM | POA: Diagnosis not present

## 2017-04-19 DIAGNOSIS — C649 Malignant neoplasm of unspecified kidney, except renal pelvis: Secondary | ICD-10-CM | POA: Diagnosis not present

## 2017-04-19 DIAGNOSIS — Z7982 Long term (current) use of aspirin: Secondary | ICD-10-CM | POA: Insufficient documentation

## 2017-04-19 DIAGNOSIS — I2510 Atherosclerotic heart disease of native coronary artery without angina pectoris: Secondary | ICD-10-CM | POA: Insufficient documentation

## 2017-04-19 DIAGNOSIS — Z951 Presence of aortocoronary bypass graft: Secondary | ICD-10-CM | POA: Diagnosis not present

## 2017-04-19 DIAGNOSIS — E1122 Type 2 diabetes mellitus with diabetic chronic kidney disease: Secondary | ICD-10-CM | POA: Insufficient documentation

## 2017-04-19 DIAGNOSIS — R011 Cardiac murmur, unspecified: Secondary | ICD-10-CM | POA: Diagnosis not present

## 2017-04-19 DIAGNOSIS — Z79899 Other long term (current) drug therapy: Secondary | ICD-10-CM | POA: Diagnosis not present

## 2017-04-19 HISTORY — PX: RADIOFREQUENCY ABLATION: SHX2290

## 2017-04-19 LAB — CBC WITH DIFFERENTIAL/PLATELET
Basophils Absolute: 0 10*3/uL (ref 0.0–0.1)
Basophils Relative: 0 %
EOS PCT: 4 %
Eosinophils Absolute: 0.2 10*3/uL (ref 0.0–0.7)
HEMATOCRIT: 33.1 % — AB (ref 39.0–52.0)
Hemoglobin: 10.7 g/dL — ABNORMAL LOW (ref 13.0–17.0)
LYMPHS ABS: 0.6 10*3/uL — AB (ref 0.7–4.0)
LYMPHS PCT: 12 %
MCH: 33.2 pg (ref 26.0–34.0)
MCHC: 32.3 g/dL (ref 30.0–36.0)
MCV: 102.8 fL — AB (ref 78.0–100.0)
MONO ABS: 0.4 10*3/uL (ref 0.1–1.0)
Monocytes Relative: 9 %
NEUTROS ABS: 3.7 10*3/uL (ref 1.7–7.7)
Neutrophils Relative %: 75 %
PLATELETS: 104 10*3/uL — AB (ref 150–400)
RBC: 3.22 MIL/uL — AB (ref 4.22–5.81)
RDW: 14.4 % (ref 11.5–15.5)
WBC: 4.8 10*3/uL (ref 4.0–10.5)

## 2017-04-19 LAB — PROTIME-INR
INR: 1.06
Prothrombin Time: 13.7 seconds (ref 11.4–15.2)

## 2017-04-19 LAB — BASIC METABOLIC PANEL
ANION GAP: 13 (ref 5–15)
BUN: 95 mg/dL — ABNORMAL HIGH (ref 6–20)
CALCIUM: 9.2 mg/dL (ref 8.9–10.3)
CHLORIDE: 102 mmol/L (ref 101–111)
CO2: 27 mmol/L (ref 22–32)
CREATININE: 7.37 mg/dL — AB (ref 0.61–1.24)
GFR calc non Af Amer: 7 mL/min — ABNORMAL LOW (ref >60)
GFR, EST AFRICAN AMERICAN: 8 mL/min — AB (ref >60)
Glucose, Bld: 124 mg/dL — ABNORMAL HIGH (ref 65–99)
Potassium: 3.8 mmol/L (ref 3.5–5.1)
SODIUM: 142 mmol/L (ref 135–145)

## 2017-04-19 LAB — GLUCOSE, CAPILLARY
GLUCOSE-CAPILLARY: 106 mg/dL — AB (ref 65–99)
GLUCOSE-CAPILLARY: 117 mg/dL — AB (ref 65–99)
GLUCOSE-CAPILLARY: 114 mg/dL — AB (ref 65–99)
Glucose-Capillary: 123 mg/dL — ABNORMAL HIGH (ref 65–99)
Glucose-Capillary: 114 mg/dL — ABNORMAL HIGH (ref 65–99)

## 2017-04-19 LAB — TYPE AND SCREEN
ABO/RH(D): A POS
ANTIBODY SCREEN: NEGATIVE

## 2017-04-19 SURGERY — RADIO FREQUENCY ABLATION
Anesthesia: General

## 2017-04-19 MED ORDER — PROPOFOL 10 MG/ML IV BOLUS
INTRAVENOUS | Status: DC | PRN
  Administered 2017-04-19: 100 mg via INTRAVENOUS

## 2017-04-19 MED ORDER — HYDROMORPHONE HCL 1 MG/ML IJ SOLN
INTRAMUSCULAR | Status: AC
  Filled 2017-04-19: qty 1

## 2017-04-19 MED ORDER — LACTATED RINGERS IV SOLN: Freq: Once | INTRAVENOUS | Status: DC

## 2017-04-19 MED ORDER — SENNOSIDES-DOCUSATE SODIUM 8.6-50 MG PO TABS: 1.0000 | ORAL_TABLET | Freq: Every day | ORAL | Status: DC | PRN

## 2017-04-19 MED ORDER — CISATRACURIUM BESYLATE (PF) 10 MG/5ML IV SOLN
INTRAVENOUS | Status: DC | PRN
  Administered 2017-04-19: 2 mg via INTRAVENOUS
  Administered 2017-04-19: 4 mg via INTRAVENOUS
  Administered 2017-04-19 (×2): 2 mg via INTRAVENOUS
  Administered 2017-04-19: 4 mg via INTRAVENOUS
  Administered 2017-04-19: 2 mg via INTRAVENOUS
  Administered 2017-04-19: 10 mg via INTRAVENOUS

## 2017-04-19 MED ORDER — EPHEDRINE SULFATE-NACL 50-0.9 MG/10ML-% IV SOSY
PREFILLED_SYRINGE | INTRAVENOUS | Status: DC | PRN
  Administered 2017-04-19: 10 mg via INTRAVENOUS
  Administered 2017-04-19: 5 mg via INTRAVENOUS
  Administered 2017-04-19: 10 mg via INTRAVENOUS
  Administered 2017-04-19 (×2): 5 mg via INTRAVENOUS

## 2017-04-19 MED ORDER — FENTANYL CITRATE (PF) 100 MCG/2ML IJ SOLN: 25.0000 ug | INTRAMUSCULAR | Status: DC | PRN

## 2017-04-19 MED ORDER — SEVELAMER CARBONATE 800 MG PO TABS
2400.0000 mg | ORAL_TABLET | Freq: Two times a day (BID) | ORAL | Status: DC | PRN
  Filled 2017-04-19: qty 3

## 2017-04-19 MED ORDER — SACCHAROMYCES BOULARDII 250 MG PO CAPS
250.0000 mg | ORAL_CAPSULE | Freq: Every day | ORAL | Status: DC
  Administered 2017-04-20: 250 mg via ORAL
  Filled 2017-04-19: qty 1

## 2017-04-19 MED ORDER — DOCUSATE SODIUM 100 MG PO CAPS
100.0000 mg | ORAL_CAPSULE | Freq: Two times a day (BID) | ORAL | Status: DC
  Administered 2017-04-19: 100 mg via ORAL
  Filled 2017-04-19 (×2): qty 1

## 2017-04-19 MED ORDER — NEOSTIGMINE METHYLSULFATE 5 MG/5ML IV SOSY
PREFILLED_SYRINGE | INTRAVENOUS | Status: DC | PRN
  Administered 2017-04-19: 4 mg via INTRAVENOUS

## 2017-04-19 MED ORDER — INSULIN ASPART 100 UNIT/ML ~~LOC~~ SOLN
0.0000 [IU] | Freq: Three times a day (TID) | SUBCUTANEOUS | Status: DC
  Administered 2017-04-20: 2 [IU] via SUBCUTANEOUS

## 2017-04-19 MED ORDER — RENA-VITE PO TABS
1.0000 | ORAL_TABLET | Freq: Every day | ORAL | Status: DC
  Administered 2017-04-20: 1 via ORAL
  Filled 2017-04-19: qty 1

## 2017-04-19 MED ORDER — GLYCOPYRROLATE 0.2 MG/ML IJ SOLN: INTRAMUSCULAR | Status: DC | PRN

## 2017-04-19 MED ORDER — IOPAMIDOL (ISOVUE-300) INJECTION 61%
INTRAVENOUS | Status: AC
  Administered 2017-04-19: 30 mL
  Filled 2017-04-19: qty 30

## 2017-04-19 MED ORDER — FENTANYL CITRATE (PF) 250 MCG/5ML IJ SOLN
INTRAMUSCULAR | Status: AC
  Filled 2017-04-19: qty 10

## 2017-04-19 MED ORDER — ONDANSETRON HCL 4 MG/2ML IJ SOLN
INTRAMUSCULAR | Status: DC | PRN
  Administered 2017-04-19: 4 mg via INTRAVENOUS

## 2017-04-19 MED ORDER — FENTANYL CITRATE (PF) 100 MCG/2ML IJ SOLN
INTRAMUSCULAR | Status: DC | PRN
  Administered 2017-04-19 (×3): 50 ug via INTRAVENOUS

## 2017-04-19 MED ORDER — HYDROMORPHONE HCL 1 MG/ML IJ SOLN
1.0000 mg | INTRAMUSCULAR | Status: DC | PRN
  Administered 2017-04-19: 1 mg via INTRAVENOUS

## 2017-04-19 MED ORDER — GLYCOPYRROLATE 0.2 MG/ML IV SOSY
PREFILLED_SYRINGE | INTRAVENOUS | Status: DC | PRN
  Administered 2017-04-19: 0.4 mg via INTRAVENOUS
  Administered 2017-04-19: 0.6 mg via INTRAVENOUS

## 2017-04-19 MED ORDER — ATORVASTATIN CALCIUM 10 MG PO TABS
10.0000 mg | ORAL_TABLET | Freq: Every day | ORAL | Status: DC
  Administered 2017-04-20: 10 mg via ORAL
  Filled 2017-04-19: qty 1

## 2017-04-19 MED ORDER — SEVELAMER CARBONATE 800 MG PO TABS
4800.0000 mg | ORAL_TABLET | Freq: Three times a day (TID) | ORAL | Status: DC
  Administered 2017-04-19 – 2017-04-20 (×2): 4800 mg via ORAL
  Filled 2017-04-19 (×3): qty 6

## 2017-04-19 MED ORDER — SEVELAMER CARBONATE 800 MG PO TABS: 2400.0000 mg | ORAL_TABLET | ORAL | Status: DC

## 2017-04-19 MED ORDER — MEPERIDINE HCL 50 MG/ML IJ SOLN: 6.2500 mg | INTRAMUSCULAR | Status: DC | PRN

## 2017-04-19 MED ORDER — IOPAMIDOL (ISOVUE-300) INJECTION 61%
30.0000 mL | Freq: Once | INTRAVENOUS | Status: AC | PRN
  Administered 2017-04-19: 30 mL

## 2017-04-19 MED ORDER — CEFAZOLIN SODIUM-DEXTROSE 2-4 GM/100ML-% IV SOLN
2.0000 g | INTRAVENOUS | Status: AC
  Administered 2017-04-19: 2 g via INTRAVENOUS
  Filled 2017-04-19: qty 100

## 2017-04-19 MED ORDER — SODIUM CHLORIDE 0.9 % IV SOLN
INTRAVENOUS | Status: DC
  Administered 2017-04-19: 07:00:00 via INTRAVENOUS

## 2017-04-19 MED ORDER — ONDANSETRON HCL 4 MG/2ML IJ SOLN: 4.0000 mg | Freq: Four times a day (QID) | INTRAMUSCULAR | Status: DC | PRN

## 2017-04-19 NOTE — Procedures (Signed)
Interventional Radiology Procedure Note  Procedure: CT guided thermal ablation of recurrent right renal cell carcinoma  Anesthesia: General  Complications: None  Estimated Blood Loss: < 10 mL  Findings: Nodular recurrence of right renal fossa treated with thermal ablation using NeuWave XT probe.  3 separate areas treated with 5 min ablations at [address]. Hydrodissection performed during case with NS diluted with contrast (250 mL total volume utilized during procedure).  Plan: PACU recovery followed by overnight observation.  Venetia Night. Kathlene Cote, M.D Pager:  [phone]

## 2017-04-19 NOTE — Anesthesia Postprocedure Evaluation (Signed)
Anesthesia Post Note  Patient: [name]  Procedure(s) Performed: CT MICROWAVE THERMAL ABLATION (N/A )     Patient location during evaluation: PACU Anesthesia Type: General Level of consciousness: awake and alert Pain management: pain level controlled Vital Signs Assessment: post-procedure vital signs reviewed and stable Respiratory status: spontaneous breathing, nonlabored ventilation, respiratory function stable and patient connected to nasal cannula oxygen Cardiovascular status: blood pressure returned to baseline and stable Postop Assessment: no apparent nausea or vomiting Anesthetic complications: no    Last Vitals:  Vitals:   04/19/17 1200 04/19/17 1215  BP: 133/62 136/63  Pulse: (!) 58 (!) 56  Resp: 14 17  Temp:    SpO2: 100% 100%    Last Pain:  Vitals:   04/19/17 0646  TempSrc: Oral                 [name]

## 2017-04-19 NOTE — Progress Notes (Signed)
Patient currently without new complaints. Vital signs stable, afebrile Puncture site right flank clean, dry, nontender; abdomen soft  Assessment/plan: Status post CT-guided thermal ablation of recurrent right renal cell carcinoma earlier today; for overnight observation; check CBC in a.m.; will follow up with [name] in the [place] [facility] in 4 weeks.   Rowe Robert, [place] [facility]

## 2017-04-19 NOTE — Anesthesia Procedure Notes (Signed)
Procedure Name: Intubation Date/Time: 04/19/2017 8:54 AM Performed by: Sharlette Dense, CRNA Patient Re-evaluated:Patient Re-evaluated prior to induction Oxygen Delivery Method: Circle system utilized Preoxygenation: Pre-oxygenation with 100% oxygen Induction Type: IV induction Ventilation: Mask ventilation without difficulty Laryngoscope Size: Miller and 3 Grade View: Grade I Tube type: Oral Tube size: 7.5 mm Number of attempts: 1 Airway Equipment and Method: Stylet Placement Confirmation: ETT inserted through vocal cords under direct vision,  positive ETCO2 and breath sounds checked- equal and bilateral Secured at: 21 cm Tube secured with: Tape Dental Injury: Teeth and Oropharynx as per pre-operative assessment

## 2017-04-19 NOTE — Progress Notes (Signed)
Patient approved of wife to be in room during admission process questioning

## 2017-04-19 NOTE — Transfer of Care (Signed)
Immediate Anesthesia Transfer of Care Note  Patient: [name]  Procedure(s) Performed: CT MICROWAVE THERMAL ABLATION (N/A )  Patient Location: PACU  Anesthesia Type:General  Level of Consciousness: awake and alert   Airway & Oxygen Therapy: Patient Spontanous Breathing and Patient connected to face mask oxygen  Post-op Assessment: Report given to RN and Post -op Vital signs reviewed and stable  Post vital signs: Reviewed and stable  Last Vitals:  Vitals:   04/19/17 0646  BP: 128/68  Pulse: (!) 50  Resp: 14  Temp: 36.5 C  SpO2: 100%    Last Pain:  Vitals:   04/19/17 0646  TempSrc: Oral         Complications: No apparent anesthesia complications

## 2017-04-19 NOTE — H&P (Signed)
Referring Physician(s): Borden,L  Supervising Physician: Aletta Edouard  Patient Status:  [facility] OP TBA  Chief Complaint: Recurrent renal cell carcinoma  Subjective: Patient familiar to IR service from prior core biopsy and RFA of right renal cell carcinoma in 2009, biopsy and cryoablation of left renal cell carcinoma in 2011 followed by a right retroperitoneal mass biopsy in 2017.  He has a history of bilateral renal cell carcinomas and has had prior right nephrectomy at [facility] in 2016 secondary to tumor recurrence.  He is also status post exploratory laparotomy with resection of right retroperitoneal clear cell recurrence by Dr. Alinda Money in January 2018.  Also performed at that time was resection of part of the right colon adherent to the tumor mass as well as wedge resection of a portion of the liver adherent to the tumor with pathology consistent with clear cell carcinoma.  Recent follow-up imaging now demonstrates hypermetabolic enlarging soft tissue nodules in the right retroperitoneum at the level of resection likely representing recurrent tumor.  Following discussion with [name] he now presents again today for CT-guided thermal ablation of the suspected right nephrectomy bed tumor recurrences.  He currently denies fever, headache, chest pain, dyspnea, cough, abdominal/back pain, nausea, vomiting or bleeding.  He did have some loose stools last night.  He is currently on a Monday/ Wednesday/ Friday hemodialysis schedule.  Past Medical History:  Diagnosis Date  . Anemia   . Arthritis    "back, neck" (07/28/2014)  . Blind    both eyes removed   . Bruises easily   . CAD, NATIVE VESSEL 01/08/2008      . Cellulitis late 1980's   "hospitalized; wrapped both legs; several times; no OR for this"  . Colon polyps   . Diabetic neuropathy ([place])   . Dialysis patient ([place])   . DM type 2 (diabetes mellitus, type 2) ([place])    no medications (07/28/2014) diet and excersie controlled not onmeds  for 14-15 years   . Dysrhythmia    afib   . ESRD (end stage renal disease) on dialysis [facility])    [name]; Mon, Wed, Fri (07/28/2014)  . Fall    hx of fall and required left hip surgery   . Family history of breast cancer    mother  . Heart murmur    hx of  . History of blood product transfusion   . HYPERLIPIDEMIA-MIXED 01/08/2008  . HYPERTENSION 01/27/2009   BP low , hx of HTN, Currently on meds to raise BP  . Hypotension   . Kidney stones   . Low blood pressure   . OVERWEIGHT/OBESITY 01/08/2008   Lost 205 lbs through diet and exercise.    . Peripheral vascular disease ([place])    vein stripping  . Pneumonia 2000;s X 1  . Prostate cancer ([place])   . Prosthetic eye globe    both eyes  . Renal cell carcinoma 2001 and 2003   "both kidneys"  . Renal insufficiency    Past Surgical History:  Procedure Laterality Date  . AV FISTULA PLACEMENT Left 02/2010   LFA  . AV FISTULA PLACEMENT Left 01/12/2016   Procedure: LEFT BRACHIOCEPHALIC ARTERIOVENOUS (AV) FISTULA CREATION;  Surgeon: Angelia Mould, MD;  Location: [place];  Service: Vascular;  Laterality: Left;  . CHOLECYSTECTOMY OPEN  1992  . COLONOSCOPY  06/14/2011   Procedure: COLONOSCOPY;  Surgeon: [name], MD;  [facility];  Service: Endoscopy;  Laterality: N/A;  . COLONOSCOPY N/A 07/24/2012  Procedure: COLONOSCOPY;  Surgeon: [name], MD;  [facility];  Service: Endoscopy;  Laterality: N/A;  . COLONOSCOPY    . COLONOSCOPY N/A 11/04/2015   Procedure: COLONOSCOPY;  Surgeon: Manus Gunning, MD;  Location: [facility] ENDOSCOPY;  Service: Gastroenterology;  Laterality: N/A;  . COLONOSCOPY WITH PROPOFOL N/A 05/13/2014   Procedure: COLONOSCOPY WITH PROPOFOL;  Surgeon: [name], MD;  [facility];  Service: Endoscopy;  Laterality: N/A;  . CORONARY ARTERY BYPASS GRAFT  09/29/2011   Procedure: CORONARY ARTERY BYPASS GRAFTING (CABG);  Surgeon: [name], MD;  Location: [place];   Service: Open Heart Surgery;  Laterality: N/A;  Coronary Artery Bypass Graft times two utilizing the left internal mammary artery and the right greater saphenous vein harvested endoscopically.  . CYSTOSCOPY WITH RETROGRADE PYELOGRAM, URETEROSCOPY AND STENT PLACEMENT Left 07/28/2014   Procedure: CYSTOSCOPY WITH RETROGRADE PYELOGRAM, URETERAL BALLOON DILITATION, URETEROSCOPY AND LEFT STENT PLACEMENT;  Surgeon: [name], MD;  [facility];  Service: Urology;  Laterality: Left;  . ENUCLEATION  2003; 2006   bilateral; "diabetes; pain"  . FOOT AMPUTATION Right ~ 2002   right;  partial; "infection"  . FRACTURE SURGERY     hip fx  . HIP PINNING,CANNULATED Left 12/31/2013   Procedure: CANNULATED HIP PINNING;  Surgeon: Renette Butters, MD;  Location: [place];  Service: Orthopedics;  Laterality: Left;  Carm, FX Table, [employer]  . HOT HEMOSTASIS  06/14/2011   Procedure: HOT HEMOSTASIS (ARGON PLASMA COAGULATION/BICAP);  Surgeon: [name], MD;  Location: [facility];  Service: Endoscopy;  Laterality: N/A;  . INSERTION OF DIALYSIS CATHETER Right 01/12/2016   Procedure: INSERTION OF DIALYSIS CATHETER;  Surgeon: Angelia Mould, MD;  Location: [place];  Service: Vascular;  Laterality: Right;  . INSERTION PROSTATE RADIATION SEED  03/2012  . IR GENERIC HISTORICAL  01/13/2016   IR RADIOLOGIST EVAL & MGMT 01/13/2016 Aletta Edouard, MD [facility] INTERV RAD  . IR RADIOLOGIST EVAL & MGMT  03/02/2017  . LAPAROTOMY N/A 04/07/2016   Procedure: EXPLORATORY LAPAROTOMY AND RESECTION OF RETROPERITONEAL MASS;  Surgeon: Raynelle Bring, MD;  [facility];  Service: Urology;  Laterality: N/A;  . LEFT HEART CATHETERIZATION WITH CORONARY ANGIOGRAM N/A 09/27/2011   Procedure: LEFT HEART CATHETERIZATION WITH CORONARY ANGIOGRAM;  Surgeon: [name], MD;  [facility] C[facility] LAB;  Service: Cardiovascular;  Laterality: N/A;  . LIGATION OF ARTERIOVENOUS  FISTULA Left 01/12/2016   Procedure: LIGATION OF LEFT  RADIOCEPHALIC ARTERIOVENOUS  FISTULA;  Surgeon: Angelia Mould, MD;  Location: [place];  Service: Vascular;  Laterality: Left;  . LIGATION OF COMPETING BRANCHES OF ARTERIOVENOUS FISTULA Left 07/29/2014   Procedure: LIGATION OF COMPETING BRANCHES OF LEFT ARM ARTERIOVENOUS FISTULA;  Surgeon: [name], MD;  Location: [place];  Service: Vascular;  Laterality: Left;  . NEPHRECTOMY Right 01/30/2015   done at [place]  . PARTIAL COLECTOMY Right 04/07/2016   Procedure: RIGHT COLECTOMY AND PARTIAL LIVER RESECTION;  Surgeon: Raynelle Bring, MD;  [facility];  Service: Urology;  Laterality: Right;  . RADIOFREQUENCY ABLATION KIDNEY  2010-2012   "twice; one on each side; for cancer"  . REVISON OF ARTERIOVENOUS FISTULA Left 09/01/2015   Procedure: EXPLORATION LEFT LOWER ARM  RADIOCEPHALIC ARTERIOVENOUS FISTULA;  Surgeon: Conrad , MD;  Location: [place];  Service: Vascular;  Laterality: Left;  [name] VARICOSE VEIN SURGERY  mid 1980's    BLE; "knees down; both legs; 2 separate times"     Allergies: Codeine and  Tape  Medications: Prior to Admission medications   Medication Sig Start Date End Date Taking? Authorizing Provider  aspirin EC 81 MG tablet Take 81 mg by mouth at bedtime.   Yes [provider]  atorvastatin (LIPITOR) 10 MG tablet Take 10 mg by mouth daily.   Yes [provider]  midodrine (PROAMATINE) 10 MG tablet Take 1 tablet (10 mg total) by mouth 3 (three) times daily with meals. 11/05/15  Yes Reyne Dumas, MD  multivitamin (RENA-VIT) TABS tablet Take 1 tablet by mouth daily.   Yes [provider]  Probiotic Product (ALIGN) 4 MG CAPS Take 1 capsule by mouth daily.   Yes [provider]  sevelamer carbonate (RENVELA) 800 MG tablet Take 2,400-4,800 mg by mouth See admin instructions. Take 6 tablets (4800 mg) by mouth 3 times daily with meals and 3 tablets (2400 mg) 2 times daily with snacks 06/26/14  Yes [provider]  Methoxy PEG-Epoetin  Beta (MIRCERA) 50 MCG/0.3ML SOSY Inject 225 mcg as directed every 14 (fourteen) days.     [provider]     Vital Signs: BP 128/68   Pulse (!) 50   Temp 97.7 F (36.5 C) (Oral)   Resp 14   Ht 6\' 4"  (1.93 m)   Wt 215 lb 5 oz (97.7 kg)   SpO2 100%   BMI 26.21 kg/m   Physical Exam awake, alert.  Patient is blind.  Chest clear to auscultation bilaterally.  Heart with bradycardic but regular rhythm.  Abdomen soft, positive bowel sounds, nontender.  No significant lower extremity edema.  Partial right foot amputation.  Left upper arm fistula with positive thrill/bruit. Imaging: Dg Chest 1 View  Result Date: 04/18/2017 CLINICAL DATA:  Preoperative examination. EXAM: CHEST 1 VIEW COMPARISON:  Chest x-ray of April 08, 2016 FINDINGS: The lungs are well-expanded. There is no focal infiltrate. The right hemidiaphragm remains higher than the left. The patient has undergone previous median sternotomy. The heart and pulmonary vascularity are normal. There is no pleural effusion. IMPRESSION: There is no active cardiopulmonary disease. Electronically Signed   By: David  Martinique M.D.   On: 04/18/2017 12:57    Labs:  CBC: Recent Labs    04/19/17 0701  WBC 4.8  HGB 10.7*  HCT 33.1*  PLT 104*    COAGS: Recent Labs    04/19/17 0701  INR 1.06    BMP: Recent Labs    04/19/17 0701  NA 142  K 3.8  CL 102  CO2 27  GLUCOSE 124*  BUN 95*  CALCIUM 9.2  CREATININE 7.37*  GFRNONAA 7*  GFRAA 8*    LIVER FUNCTION TESTS: No results for input(s): BILITOT, AST, ALT, ALKPHOS, PROT, ALBUMIN in the last 8760 hours.  Assessment and Plan:  Pt with history of bilateral renal cell carcinomas ; s/p prior core biopsy/radiofrequency ablation of right renal cell carcinoma in 2009, cryoablation and biopsy of left renal cell carcinoma 2011 and right retroperitoneal mass biopsy in 2017 ,prior right nephrectomy at [facility] in 2016 secondary to tumor recurrence.  He is also status post exploratory  laparotomy with resection of right retroperitoneal clear cell recurrence by Dr. Alinda Money in January 2018.  Also performed at that time was resection of part of the right colon adherent to the tumor mass as well as wedge resection of a portion of the liver adherent to the tumor with pathology consistent with clear cell carcinoma.  Recent follow-up imaging now demonstrates hypermetabolic enlarging soft tissue nodules in the right  retroperitoneum at the level of resection likely representing recurrent tumor.  Following discussion with [name] he now presents again today for CT-guided thermal ablation of the suspected right nephrectomy bed tumor recurrences.  Details/risks of procedure, including but not limited to, internal bleeding, infection, injury to adjacent structures, anesthesia related complications discussed with patient and wife with their understanding and consent.  Postprocedure he will be admitted for overnight observation. This procedure involves the use of X-rays and because of the nature of the planned procedure, it is possible that we will have prolonged use of CT.  Potential radiation risks to you include (but are not limited to) the following: - A slightly elevated risk for cancer  several years later in life. This risk is typically less than 0.5% percent. This risk is low in comparison to the normal incidence of human cancer, which is 33% for women and 50% for men according to the [place]. - Radiation induced injury can include skin redness, resembling a rash, tissue breakdown / ulcers and hair loss (which can be temporary or permanent).   The likelihood of either of these occurring depends on the difficulty of the procedure and whether you are sensitive to radiation due to previous procedures, disease, or genetic conditions.   IF your procedure requires a prolonged use of radiation, you will be notified and given written instructions for further action.  It is your  responsibility to monitor the irradiated area for the 2 weeks following the procedure and to notify your physician if you are concerned that you have suffered a radiation induced injury.    Electronically Signed: D. Rowe Robert, PA-C 04/19/2017, 7:54 AM   I spent a total of 30 minutes at the the patient's bedside AND on the patient's hospital floor or unit, greater than 50% of which was counseling/coordinating care for CT-guided thermal ablation of right nephrectomy bed renal cell carcinoma recurrences

## 2017-04-20 ENCOUNTER — Encounter (HOSPITAL_COMMUNITY): Payer: Self-pay | Admitting: Interventional Radiology

## 2017-04-20 DIAGNOSIS — D631 Anemia in chronic kidney disease: Secondary | ICD-10-CM | POA: Diagnosis not present

## 2017-04-20 DIAGNOSIS — E1122 Type 2 diabetes mellitus with diabetic chronic kidney disease: Secondary | ICD-10-CM | POA: Diagnosis not present

## 2017-04-20 DIAGNOSIS — C649 Malignant neoplasm of unspecified kidney, except renal pelvis: Secondary | ICD-10-CM | POA: Diagnosis not present

## 2017-04-20 DIAGNOSIS — K7689 Other specified diseases of liver: Secondary | ICD-10-CM | POA: Diagnosis not present

## 2017-04-20 DIAGNOSIS — N2581 Secondary hyperparathyroidism of renal origin: Secondary | ICD-10-CM | POA: Diagnosis not present

## 2017-04-20 DIAGNOSIS — M47819 Spondylosis without myelopathy or radiculopathy, site unspecified: Secondary | ICD-10-CM | POA: Diagnosis not present

## 2017-04-20 DIAGNOSIS — E119 Type 2 diabetes mellitus without complications: Secondary | ICD-10-CM | POA: Diagnosis not present

## 2017-04-20 DIAGNOSIS — I120 Hypertensive chronic kidney disease with stage 5 chronic kidney disease or end stage renal disease: Secondary | ICD-10-CM | POA: Diagnosis not present

## 2017-04-20 DIAGNOSIS — E1140 Type 2 diabetes mellitus with diabetic neuropathy, unspecified: Secondary | ICD-10-CM | POA: Diagnosis not present

## 2017-04-20 DIAGNOSIS — D509 Iron deficiency anemia, unspecified: Secondary | ICD-10-CM | POA: Diagnosis not present

## 2017-04-20 DIAGNOSIS — N186 End stage renal disease: Secondary | ICD-10-CM | POA: Diagnosis not present

## 2017-04-20 DIAGNOSIS — M47812 Spondylosis without myelopathy or radiculopathy, cervical region: Secondary | ICD-10-CM | POA: Diagnosis not present

## 2017-04-20 DIAGNOSIS — C641 Malignant neoplasm of right kidney, except renal pelvis: Secondary | ICD-10-CM | POA: Diagnosis not present

## 2017-04-20 LAB — CBC
HEMATOCRIT: 29.8 % — AB (ref 39.0–52.0)
Hemoglobin: 9.8 g/dL — ABNORMAL LOW (ref 13.0–17.0)
MCH: 33.6 pg (ref 26.0–34.0)
MCHC: 32.9 g/dL (ref 30.0–36.0)
MCV: 102.1 fL — ABNORMAL HIGH (ref 78.0–100.0)
PLATELETS: 89 10*3/uL — AB (ref 150–400)
RBC: 2.92 MIL/uL — ABNORMAL LOW (ref 4.22–5.81)
RDW: 14.5 % (ref 11.5–15.5)
WBC: 3.3 10*3/uL — AB (ref 4.0–10.5)

## 2017-04-20 LAB — GLUCOSE, CAPILLARY: GLUCOSE-CAPILLARY: 127 mg/dL — AB (ref 65–99)

## 2017-04-20 NOTE — Discharge Summary (Signed)
Patient ID: AMITAI DELAUGHTER MRN: [id number] DOB/AGE: 1950/06/12 67 y.o.  Admit date: 04/19/2017 Discharge date: 04/20/2017  Supervising Physician: Aletta Edouard  Patient Status: [facility] - [facility]  Admission Diagnoses: Recurrent right renal cell carcinoma  Discharge Diagnoses: Recurrent right renal cell carcinoma, status post CT-guided thermal ablation on 04/19/17  Discharged Condition: good  Hospital Course: Mr. Borum is a 67 yo male with history of bilateral renal cell carcinomas ; s/p prior core biopsy/radiofrequency ablation of right renal cell carcinoma in 2009, cryoablation and biopsy of left renal cell carcinoma 2011 and right retroperitoneal mass biopsy in 2017 ,prior right nephrectomy at [facility] in 2016 secondary to tumor recurrence.  He is also status post exploratory laparotomy with resection of right retroperitoneal clear cell recurrence by Dr. Alinda Money in January 2018.  Also performed at that time was resection of part of the right colon adherent to the tumor mass as well as wedge resection of a portion of the liver adherent to the tumor with pathology consistent with clear cell carcinoma.  Recent follow-up imaging demonstrated hypermetabolic enlarging soft tissue nodules in the right retroperitoneum at the level of resection likely representing recurrent tumor.  Following discussion with [name] he was deemed a candidate for CT-guided thermal ablation of the suspected right nephrectomy bed tumor recurrences.  On 04/19/17 he underwent CT-guided thermal ablation of the recurrent right renal cell carcinoma via general anesthesia.  3 separate areas were treated.  He tolerated the procedure well and was admitted for overnight observation.  Overnight the patient did well with some mild right flank discomfort.  On the morning of discharge he was stable.  He denied significant/ worsening flank pain, nausea, vomiting ,respiratory difficulties.  Follow-up hemoglobin was 9.8.  Above findings were  discussed with [name] and he was deemed stable for discharge at this time.  He is scheduled for hemodialysis later today.  He will follow-up with [name] in the [place] clinic in 3-4 weeks.  He was told to contact our service with any additional questions or concerns.  He will resume his usual home medications.   Consults: none  Significant Diagnostic Studies:  Results for orders placed or performed during the hospital encounter of 09/27/92  Basic metabolic panel  Result Value Ref Range   Sodium 142 135 - 145 mmol/L   Potassium 3.8 3.5 - 5.1 mmol/L   Chloride 102 101 - 111 mmol/L   CO2 27 22 - 32 mmol/L   Glucose, Bld 124 (H) 65 - 99 mg/dL   BUN 95 (H) 6 - 20 mg/dL   Creatinine, Ser 7.37 (H) 0.61 - 1.24 mg/dL   Calcium 9.2 8.9 - 10.3 mg/dL   GFR calc non Af Amer 7 (L) >60 mL/min   GFR calc Af Amer 8 (L) >60 mL/min   Anion gap 13 5 - 15  CBC with Differential/Platelet  Result Value Ref Range   WBC 4.8 4.0 - 10.5 K/uL   RBC 3.22 (L) 4.22 - 5.81 MIL/uL   Hemoglobin 10.7 (L) 13.0 - 17.0 g/dL   HCT 33.1 (L) 39.0 - 52.0 %   MCV 102.8 (H) 78.0 - 100.0 fL   MCH 33.2 26.0 - 34.0 pg   MCHC 32.3 30.0 - 36.0 g/dL   RDW 14.4 11.5 - 15.5 %   Platelets 104 (L) 150 - 400 K/uL   Neutrophils Relative % 75 %   Neutro Abs 3.7 1.7 - 7.7 K/uL   Lymphocytes Relative 12 %   Lymphs Abs 0.6 (  L) 0.7 - 4.0 K/uL   Monocytes Relative 9 %   Monocytes Absolute 0.4 0.1 - 1.0 K/uL   Eosinophils Relative 4 %   Eosinophils Absolute 0.2 0.0 - 0.7 K/uL   Basophils Relative 0 %   Basophils Absolute 0.0 0.0 - 0.1 K/uL  Protime-INR  Result Value Ref Range   Prothrombin Time 13.7 11.4 - 15.2 seconds   INR 1.06   Glucose, capillary  Result Value Ref Range   Glucose-Capillary 106 (H) 65 - 99 mg/dL  Glucose, capillary  Result Value Ref Range   Glucose-Capillary 114 (H) 65 - 99 mg/dL   Comment 1 Notify RN    Comment 2 Document in Chart   CBC  Result Value Ref Range   WBC 3.3 (L) 4.0 - 10.5 K/uL   RBC  2.92 (L) 4.22 - 5.81 MIL/uL   Hemoglobin 9.8 (L) 13.0 - 17.0 g/dL   HCT 29.8 (L) 39.0 - 52.0 %   MCV 102.1 (H) 78.0 - 100.0 fL   MCH 33.6 26.0 - 34.0 pg   MCHC 32.9 30.0 - 36.0 g/dL   RDW 14.5 11.5 - 15.5 %   Platelets 89 (L) 150 - 400 K/uL  Glucose, capillary  Result Value Ref Range   Glucose-Capillary 123 (H) 65 - 99 mg/dL  Glucose, capillary  Result Value Ref Range   Glucose-Capillary 114 (H) 65 - 99 mg/dL   Comment 1 Notify RN   Glucose, capillary  Result Value Ref Range   Glucose-Capillary 127 (H) 65 - 99 mg/dL   Comment 1 Notify RN    Comment 2 Document in Chart   Type and screen  Result Value Ref Range   ABO/RH(D) A POS    Antibody Screen NEG    Sample Expiration 04/22/2017      Treatments: CT-guided thermal ablation of recurrent right renal cell carcinoma via general anesthesia on 04/19/17  Discharge Exam: Blood pressure (!) 117/59, pulse (!) 58, temperature 98.7 F (37.1 C), temperature source Oral, resp. rate 16, height 6\' 4"  (1.93 m), weight 215 lb 5 oz (97.7 kg), SpO2 96 %. Patient awake, alert.  He is blind.  Chest clear to auscultation bilaterally.  Heart with bradycardic but regular rhythm.  Abdomen soft, positive bowel sounds, nontender.  Puncture sites right flank with minimal amount of blood-tinged drainage, soft to touch, nontender.  Lower extremities with no edema. Left UE fistula with good thrill/bruit.  Disposition: 01-Home or Self Care  Discharge Instructions    Call MD for:  difficulty breathing, headache or visual disturbances   Complete by:  As directed    Call MD for:  extreme fatigue   Complete by:  As directed    Call MD for:  hives   Complete by:  As directed    Call MD for:  persistant dizziness or light-headedness   Complete by:  As directed    Call MD for:  persistant nausea and vomiting   Complete by:  As directed    Call MD for:  redness, tenderness, or signs of infection (pain, swelling, redness, odor or green/yellow discharge around  incision site)   Complete by:  As directed    Call MD for:  severe uncontrolled pain   Complete by:  As directed    Call MD for:  temperature >100.4   Complete by:  As directed    Change dressing (specify)   Complete by:  As directed    May change bandage over right flank region and apply  new bandage to site for the next 2-3 days.  May wash site with soap and water.   Diet - low sodium heart healthy   Complete by:  As directed    Increase activity slowly   Complete by:  As directed    May shower / Bathe   Complete by:  As directed    May walk up steps   Complete by:  As directed      Allergies as of 04/20/2017      Reactions   Codeine Other (See Comments)    STATES MAKES HIM COMATOSE   Tape Other (See Comments)   Plastic tape tears skin off, please use paper tape instead.      Medication List    TAKE these medications   ALIGN 4 MG Caps Take 1 capsule by mouth daily.   aspirin EC 81 MG tablet Take 81 mg by mouth at bedtime.   atorvastatin 10 MG tablet Commonly known as:  LIPITOR Take 10 mg by mouth daily.   midodrine 10 MG tablet Commonly known as:  PROAMATINE Take 1 tablet (10 mg total) by mouth 3 (three) times daily with meals.   MIRCERA 50 MCG/0.3ML Sosy Generic drug:  Methoxy PEG-Epoetin Beta Inject 225 mcg as directed every 14 (fourteen) days.   multivitamin Tabs tablet Take 1 tablet by mouth daily.   sevelamer carbonate 800 MG tablet Commonly known as:  RENVELA Take 2,400-4,800 mg by mouth See admin instructions. Take 6 tablets (4800 mg) by mouth 3 times daily with meals and 3 tablets (2400 mg) 2 times daily with snacks            Discharge Care Instructions  (From admission, onward)        Start     Ordered   04/20/17 0000  Change dressing (specify)    Comments:  May change bandage over right flank region and apply new bandage to site for the next 2-3 days.  May wash site with soap and water.   04/20/17 0160     Follow-up Information     Aletta Edouard, MD Follow up.   Specialties:  Interventional Radiology, Radiology Why:  Radiology service will call you with follow-up appointment with [name] in the IR clinic in 3-4 weeks.Call [phone] or [phone] with any questions. Contact information: [address] [place] [address] [phone]        Raynelle Bring, MD Follow up.   Specialty:  Urology Why:  Follow-up with Dr. Alinda Money as needed/scheduled Contact information: [place] [address] [phone]            Electronically Signed: D. Rowe Robert, PA-C 04/20/2017, 9:35 AM   I have spent less than 30 minutes discharging [place].

## 2017-04-20 NOTE — Progress Notes (Signed)
Patient is being discharged home. Discharge instructions were given to patient and family. Information regarding dressing change, bath and  sign and symptoms of infection were provided as well as who to call.

## 2017-04-20 NOTE — Discharge Instructions (Signed)
Please monitor puncture site right flank for increasing redness, pain, drainage, swelling; call [phone] or [phone] and ask to speak with interventional radiologist on call with any pertinent questions.

## 2017-04-21 DIAGNOSIS — N2581 Secondary hyperparathyroidism of renal origin: Secondary | ICD-10-CM | POA: Diagnosis not present

## 2017-04-21 DIAGNOSIS — D509 Iron deficiency anemia, unspecified: Secondary | ICD-10-CM | POA: Diagnosis not present

## 2017-04-21 DIAGNOSIS — D631 Anemia in chronic kidney disease: Secondary | ICD-10-CM | POA: Diagnosis not present

## 2017-04-21 DIAGNOSIS — N186 End stage renal disease: Secondary | ICD-10-CM | POA: Diagnosis not present

## 2017-04-21 DIAGNOSIS — E119 Type 2 diabetes mellitus without complications: Secondary | ICD-10-CM | POA: Diagnosis not present

## 2017-04-21 DIAGNOSIS — K7689 Other specified diseases of liver: Secondary | ICD-10-CM | POA: Diagnosis not present

## 2017-04-24 DIAGNOSIS — E119 Type 2 diabetes mellitus without complications: Secondary | ICD-10-CM | POA: Diagnosis not present

## 2017-04-24 DIAGNOSIS — N186 End stage renal disease: Secondary | ICD-10-CM | POA: Diagnosis not present

## 2017-04-24 DIAGNOSIS — N2581 Secondary hyperparathyroidism of renal origin: Secondary | ICD-10-CM | POA: Diagnosis not present

## 2017-04-24 DIAGNOSIS — K7689 Other specified diseases of liver: Secondary | ICD-10-CM | POA: Diagnosis not present

## 2017-04-24 DIAGNOSIS — D631 Anemia in chronic kidney disease: Secondary | ICD-10-CM | POA: Diagnosis not present

## 2017-04-24 DIAGNOSIS — D509 Iron deficiency anemia, unspecified: Secondary | ICD-10-CM | POA: Diagnosis not present

## 2017-04-26 DIAGNOSIS — D509 Iron deficiency anemia, unspecified: Secondary | ICD-10-CM | POA: Diagnosis not present

## 2017-04-26 DIAGNOSIS — N186 End stage renal disease: Secondary | ICD-10-CM | POA: Diagnosis not present

## 2017-04-26 DIAGNOSIS — N2581 Secondary hyperparathyroidism of renal origin: Secondary | ICD-10-CM | POA: Diagnosis not present

## 2017-04-26 DIAGNOSIS — K7689 Other specified diseases of liver: Secondary | ICD-10-CM | POA: Diagnosis not present

## 2017-04-26 DIAGNOSIS — D631 Anemia in chronic kidney disease: Secondary | ICD-10-CM | POA: Diagnosis not present

## 2017-04-26 DIAGNOSIS — E119 Type 2 diabetes mellitus without complications: Secondary | ICD-10-CM | POA: Diagnosis not present

## 2017-04-28 DIAGNOSIS — N186 End stage renal disease: Secondary | ICD-10-CM | POA: Diagnosis not present

## 2017-04-28 DIAGNOSIS — E119 Type 2 diabetes mellitus without complications: Secondary | ICD-10-CM | POA: Diagnosis not present

## 2017-04-28 DIAGNOSIS — D631 Anemia in chronic kidney disease: Secondary | ICD-10-CM | POA: Diagnosis not present

## 2017-04-28 DIAGNOSIS — D509 Iron deficiency anemia, unspecified: Secondary | ICD-10-CM | POA: Diagnosis not present

## 2017-04-28 DIAGNOSIS — N2581 Secondary hyperparathyroidism of renal origin: Secondary | ICD-10-CM | POA: Diagnosis not present

## 2017-04-28 DIAGNOSIS — K7689 Other specified diseases of liver: Secondary | ICD-10-CM | POA: Diagnosis not present

## 2017-05-01 DIAGNOSIS — D509 Iron deficiency anemia, unspecified: Secondary | ICD-10-CM | POA: Diagnosis not present

## 2017-05-01 DIAGNOSIS — D631 Anemia in chronic kidney disease: Secondary | ICD-10-CM | POA: Diagnosis not present

## 2017-05-01 DIAGNOSIS — K7689 Other specified diseases of liver: Secondary | ICD-10-CM | POA: Diagnosis not present

## 2017-05-01 DIAGNOSIS — N2581 Secondary hyperparathyroidism of renal origin: Secondary | ICD-10-CM | POA: Diagnosis not present

## 2017-05-01 DIAGNOSIS — N186 End stage renal disease: Secondary | ICD-10-CM | POA: Diagnosis not present

## 2017-05-01 DIAGNOSIS — E119 Type 2 diabetes mellitus without complications: Secondary | ICD-10-CM | POA: Diagnosis not present

## 2017-05-03 DIAGNOSIS — E119 Type 2 diabetes mellitus without complications: Secondary | ICD-10-CM | POA: Diagnosis not present

## 2017-05-03 DIAGNOSIS — N2581 Secondary hyperparathyroidism of renal origin: Secondary | ICD-10-CM | POA: Diagnosis not present

## 2017-05-03 DIAGNOSIS — K7689 Other specified diseases of liver: Secondary | ICD-10-CM | POA: Diagnosis not present

## 2017-05-03 DIAGNOSIS — N186 End stage renal disease: Secondary | ICD-10-CM | POA: Diagnosis not present

## 2017-05-03 DIAGNOSIS — D509 Iron deficiency anemia, unspecified: Secondary | ICD-10-CM | POA: Diagnosis not present

## 2017-05-03 DIAGNOSIS — D631 Anemia in chronic kidney disease: Secondary | ICD-10-CM | POA: Diagnosis not present

## 2017-05-04 DIAGNOSIS — E1129 Type 2 diabetes mellitus with other diabetic kidney complication: Secondary | ICD-10-CM | POA: Diagnosis not present

## 2017-05-04 DIAGNOSIS — N186 End stage renal disease: Secondary | ICD-10-CM | POA: Diagnosis not present

## 2017-05-04 DIAGNOSIS — Z992 Dependence on renal dialysis: Secondary | ICD-10-CM | POA: Diagnosis not present

## 2017-05-05 DIAGNOSIS — D509 Iron deficiency anemia, unspecified: Secondary | ICD-10-CM | POA: Diagnosis not present

## 2017-05-05 DIAGNOSIS — K7689 Other specified diseases of liver: Secondary | ICD-10-CM | POA: Diagnosis not present

## 2017-05-05 DIAGNOSIS — N2581 Secondary hyperparathyroidism of renal origin: Secondary | ICD-10-CM | POA: Diagnosis not present

## 2017-05-05 DIAGNOSIS — N186 End stage renal disease: Secondary | ICD-10-CM | POA: Diagnosis not present

## 2017-05-05 DIAGNOSIS — D631 Anemia in chronic kidney disease: Secondary | ICD-10-CM | POA: Diagnosis not present

## 2017-05-05 DIAGNOSIS — E119 Type 2 diabetes mellitus without complications: Secondary | ICD-10-CM | POA: Diagnosis not present

## 2017-05-05 DIAGNOSIS — Z992 Dependence on renal dialysis: Secondary | ICD-10-CM | POA: Diagnosis not present

## 2017-05-05 DIAGNOSIS — E1129 Type 2 diabetes mellitus with other diabetic kidney complication: Secondary | ICD-10-CM | POA: Diagnosis not present

## 2017-05-08 DIAGNOSIS — N186 End stage renal disease: Secondary | ICD-10-CM | POA: Diagnosis not present

## 2017-05-08 DIAGNOSIS — E119 Type 2 diabetes mellitus without complications: Secondary | ICD-10-CM | POA: Diagnosis not present

## 2017-05-08 DIAGNOSIS — D509 Iron deficiency anemia, unspecified: Secondary | ICD-10-CM | POA: Diagnosis not present

## 2017-05-08 DIAGNOSIS — N2581 Secondary hyperparathyroidism of renal origin: Secondary | ICD-10-CM | POA: Diagnosis not present

## 2017-05-08 DIAGNOSIS — D631 Anemia in chronic kidney disease: Secondary | ICD-10-CM | POA: Diagnosis not present

## 2017-05-08 DIAGNOSIS — K7689 Other specified diseases of liver: Secondary | ICD-10-CM | POA: Diagnosis not present

## 2017-05-10 DIAGNOSIS — E119 Type 2 diabetes mellitus without complications: Secondary | ICD-10-CM | POA: Diagnosis not present

## 2017-05-10 DIAGNOSIS — N2581 Secondary hyperparathyroidism of renal origin: Secondary | ICD-10-CM | POA: Diagnosis not present

## 2017-05-10 DIAGNOSIS — K7689 Other specified diseases of liver: Secondary | ICD-10-CM | POA: Diagnosis not present

## 2017-05-10 DIAGNOSIS — D631 Anemia in chronic kidney disease: Secondary | ICD-10-CM | POA: Diagnosis not present

## 2017-05-10 DIAGNOSIS — D509 Iron deficiency anemia, unspecified: Secondary | ICD-10-CM | POA: Diagnosis not present

## 2017-05-10 DIAGNOSIS — N186 End stage renal disease: Secondary | ICD-10-CM | POA: Diagnosis not present

## 2017-05-12 DIAGNOSIS — D509 Iron deficiency anemia, unspecified: Secondary | ICD-10-CM | POA: Diagnosis not present

## 2017-05-12 DIAGNOSIS — N2581 Secondary hyperparathyroidism of renal origin: Secondary | ICD-10-CM | POA: Diagnosis not present

## 2017-05-12 DIAGNOSIS — K7689 Other specified diseases of liver: Secondary | ICD-10-CM | POA: Diagnosis not present

## 2017-05-12 DIAGNOSIS — N186 End stage renal disease: Secondary | ICD-10-CM | POA: Diagnosis not present

## 2017-05-12 DIAGNOSIS — D631 Anemia in chronic kidney disease: Secondary | ICD-10-CM | POA: Diagnosis not present

## 2017-05-12 DIAGNOSIS — E119 Type 2 diabetes mellitus without complications: Secondary | ICD-10-CM | POA: Diagnosis not present

## 2017-05-15 DIAGNOSIS — K7689 Other specified diseases of liver: Secondary | ICD-10-CM | POA: Diagnosis not present

## 2017-05-15 DIAGNOSIS — D509 Iron deficiency anemia, unspecified: Secondary | ICD-10-CM | POA: Diagnosis not present

## 2017-05-15 DIAGNOSIS — D631 Anemia in chronic kidney disease: Secondary | ICD-10-CM | POA: Diagnosis not present

## 2017-05-15 DIAGNOSIS — N2581 Secondary hyperparathyroidism of renal origin: Secondary | ICD-10-CM | POA: Diagnosis not present

## 2017-05-15 DIAGNOSIS — N186 End stage renal disease: Secondary | ICD-10-CM | POA: Diagnosis not present

## 2017-05-15 DIAGNOSIS — E119 Type 2 diabetes mellitus without complications: Secondary | ICD-10-CM | POA: Diagnosis not present

## 2017-05-17 DIAGNOSIS — D509 Iron deficiency anemia, unspecified: Secondary | ICD-10-CM | POA: Diagnosis not present

## 2017-05-17 DIAGNOSIS — K7689 Other specified diseases of liver: Secondary | ICD-10-CM | POA: Diagnosis not present

## 2017-05-17 DIAGNOSIS — N2581 Secondary hyperparathyroidism of renal origin: Secondary | ICD-10-CM | POA: Diagnosis not present

## 2017-05-17 DIAGNOSIS — E119 Type 2 diabetes mellitus without complications: Secondary | ICD-10-CM | POA: Diagnosis not present

## 2017-05-17 DIAGNOSIS — D631 Anemia in chronic kidney disease: Secondary | ICD-10-CM | POA: Diagnosis not present

## 2017-05-17 DIAGNOSIS — N186 End stage renal disease: Secondary | ICD-10-CM | POA: Diagnosis not present

## 2017-05-17 DIAGNOSIS — C641 Malignant neoplasm of right kidney, except renal pelvis: Secondary | ICD-10-CM | POA: Diagnosis not present

## 2017-05-17 DIAGNOSIS — C61 Malignant neoplasm of prostate: Secondary | ICD-10-CM | POA: Diagnosis not present

## 2017-05-18 ENCOUNTER — Ambulatory Visit
Admission: RE | Admit: 2017-05-18 | Discharge: 2017-05-18 | Disposition: A | Discharge status: Home / Self Care | Payer: Medicare Other | Source: Ambulatory Visit | Attending: Radiology | Admitting: Radiology

## 2017-05-18 DIAGNOSIS — C641 Malignant neoplasm of right kidney, except renal pelvis: Secondary | ICD-10-CM | POA: Diagnosis not present

## 2017-05-18 HISTORY — PX: IR RADIOLOGIST EVAL & MGMT: IMG5224

## 2017-05-18 NOTE — Progress Notes (Signed)
Chief Complaint: Recurrent clear cell carcinoma  Referring Physician(s): Raynelle Bring  Supervising Physician: Aletta Edouard  History of Present Illness: [name] is a 67 y.o. male with a history of bilateral renal carcinoma who is known to [name] after prior past bilateral renal ablation procedures.    He underwent right nephrectomy in October, 2016 at [facility] due to tumor recurrence.   This was complicated by postoperative infection and ultimately an enlarging right retroperitoneal soft tissue nodule consistent with tumor recurrence.   This mass was sampled on 02/02/2016 with pathology confirming clear cell carcinoma.   He then underwent exploratory laparotomy with resection of the right retroperitoneal clear cell recurrence by Dr. Alinda Money on 04/07/2016.   Dr. Marcello Moores also assisted in performing resection of part of the right colon adherent to the tumor mass as well as wedge resection of a portion of the liver adherent to the tumor.   Pathology revealed a 5.5 cm clear cell carcinoma with negative margins at the level of the liver resection and colonic resection.  Follow-up imaging in April and October demonstrated enlarging soft tissue nodules in the right retroperitoneum at the level of resection likely representing recurrent tumor.   Two separate adjacent areas measure approximately 1.3 x 2.4 cm and 1.6 x 2.0 cm.   A PET scan was performed on 02/28/2017 demonstrating that both of these nodular areas are hypermetabolic with SUV max of 11 and consistent with malignancy.   There was no evidence of any additional metastatic disease by PET scan throughout the rest of the body.    He was referred back to [name] by Dr. Alinda Money to determine if he was an ablation candidate to treat the areas of tumor recurrence.    He continues hemodialysis three times a week which is managed by Dr. Jimmy Footman.  He underwent thermal ablation of the recurrent renal cancer  by [name] on 04/19/2017.  He is here today for his post procedure follow up.  He was seen by Dr. Alinda Money today.  He denies any pain. No SOB, no fevers, no issues.   Past Medical History:  Diagnosis Date  . Anemia   . Arthritis    "back, neck" (07/28/2014)  . Blind    both eyes removed   . Bruises easily   . CAD, NATIVE VESSEL 01/08/2008      . Cellulitis late 1980's   "hospitalized; wrapped both legs; several times; no OR for this"  . Colon polyps   . Diabetic neuropathy ([place])   . Dialysis patient ([place])   . DM type 2 (diabetes mellitus, type 2) ([place])    no medications (07/28/2014) diet and excersie controlled not onmeds for 14-15 years   . Dysrhythmia    afib   . ESRD (end stage renal disease) on dialysis [facility])    [name]; Mon, Wed, Fri (07/28/2014)  . Fall    hx of fall and required left hip surgery   . Family history of breast cancer    mother  . Heart murmur    hx of  . History of blood product transfusion   . HYPERLIPIDEMIA-MIXED 01/08/2008  . HYPERTENSION 01/27/2009   BP low , hx of HTN, Currently on meds to raise BP  . Hypotension   . Kidney stones   . Low blood pressure   . OVERWEIGHT/OBESITY 01/08/2008   Lost 205 lbs through diet and exercise.    . Peripheral vascular disease ([place])    vein stripping  .  Pneumonia 2000;s X 1  . Prostate cancer ([place])   . Prosthetic eye globe    both eyes  . Renal cell carcinoma 2001 and 2003   "both kidneys"  . Renal insufficiency     Past Surgical History:  Procedure Laterality Date  . AV FISTULA PLACEMENT Left 02/2010   LFA  . AV FISTULA PLACEMENT Left 01/12/2016   Procedure: LEFT BRACHIOCEPHALIC ARTERIOVENOUS (AV) FISTULA CREATION;  Surgeon: Angelia Mould, MD;  Location: [place];  Service: Vascular;  Laterality: Left;  . CHOLECYSTECTOMY OPEN  1992  . COLONOSCOPY  06/14/2011   Procedure: COLONOSCOPY;  Surgeon: [name], MD;  Location: [facility];  Service: Endoscopy;  Laterality: N/A;  .  COLONOSCOPY N/A 07/24/2012   Procedure: COLONOSCOPY;  Surgeon: [name], MD;  Location: [facility];  Service: Endoscopy;  Laterality: N/A;  . COLONOSCOPY    . COLONOSCOPY N/A 11/04/2015   Procedure: COLONOSCOPY;  Surgeon: Manus Gunning, MD;  Location[facility] ENDOSCOPY;  Service: Gastroenterology;  Laterality: N/A;  . COLONOSCOPY WITH PROPOFOL N/A 05/13/2014   Procedure: COLONOSCOPY WITH PROPOFOL;  Surgeon: [name], MD;  [facility];  Service: Endoscopy;  Laterality: N/A;  . CORONARY ARTERY BYPASS GRAFT  09/29/2011   Procedure: CORONARY ARTERY BYPASS GRAFTING (CABG);  Surgeon: [name], MD;  Location: [place];  Service: Open Heart Surgery;  Laterality: N/A;  Coronary Artery Bypass Graft times two utilizing the left internal mammary artery and the right greater saphenous vein harvested endoscopically.  . CYSTOSCOPY WITH RETROGRADE PYELOGRAM, URETEROSCOPY AND STENT PLACEMENT Left 07/28/2014   Procedure: CYSTOSCOPY WITH RETROGRADE PYELOGRAM, URETERAL BALLOON DILITATION, URETEROSCOPY AND LEFT STENT PLACEMENT;  Surgeon: [name], MD;  [facility];  Service: Urology;  Laterality: Left;  . ENUCLEATION  2003; 2006   bilateral; "diabetes; pain"  . FOOT AMPUTATION Right ~ 2002   right;  partial; "infection"  . FRACTURE SURGERY     hip fx  . HIP PINNING,CANNULATED Left 12/31/2013   Procedure: CANNULATED HIP PINNING;  Surgeon: Renette Butters, MD;  Location: [place];  Service: Orthopedics;  Laterality: Left;  Carm, FX Table, [employer]  . HOT HEMOSTASIS  06/14/2011   Procedure: HOT HEMOSTASIS (ARGON PLASMA COAGULATION/BICAP);  Surgeon: [name], MD;  Location: [facility];  Service: Endoscopy;  Laterality: N/A;  . INSERTION OF DIALYSIS CATHETER Right 01/12/2016   Procedure: INSERTION OF DIALYSIS CATHETER;  Surgeon: Angelia Mould, MD;  Location: [place];  Service: Vascular;  Laterality: Right;  . INSERTION PROSTATE RADIATION SEED  03/2012  . IR GENERIC  HISTORICAL  01/13/2016   IR RADIOLOGIST EVAL & MGMT 01/13/2016 Aletta Edouard, MD [facility] INTERV RAD  . IR RADIOLOGIST EVAL & MGMT  03/02/2017  . LAPAROTOMY N/A 04/07/2016   Procedure: EXPLORATORY LAPAROTOMY AND RESECTION OF RETROPERITONEAL MASS;  Surgeon: Raynelle Bring, MD;  [facility];  Service: Urology;  Laterality: N/A;  . LEFT HEART CATHETERIZATION WITH CORONARY ANGIOGRAM N/A 09/27/2011   Procedure: LEFT HEART CATHETERIZATION WITH CORONARY ANGIOGRAM;  Surgeon: [name], MD;  Location: [facility] CATH LAB;  Service: Cardiovascular;  Laterality: N/A;  . LIGATION OF ARTERIOVENOUS  FISTULA Left 01/12/2016   Procedure: LIGATION OF LEFT RADIOCEPHALIC ARTERIOVENOUS  FISTULA;  Surgeon: Angelia Mould, MD;  Location: [place];  Service: Vascular;  Laterality: Left;  . LIGATION OF COMPETING BRANCHES OF ARTERIOVENOUS FISTULA Left 07/29/2014   Procedure: LIGATION OF COMPETING BRANCHES OF LEFT ARM ARTERIOVENOUS FISTULA;  Surgeon: [name], MD;  Location: [facility];  Service: Vascular;  Laterality: Left;  . NEPHRECTOMY Right 01/30/2015   done at [place]  . PARTIAL COLECTOMY Right 04/07/2016   Procedure: RIGHT COLECTOMY AND PARTIAL LIVER RESECTION;  Surgeon: Raynelle Bring, MD;  [facility];  Service: Urology;  Laterality: Right;  . RADIOFREQUENCY ABLATION N/A 04/19/2017   Procedure: CT MICROWAVE THERMAL ABLATION;  Surgeon: Aletta Edouard, MD;  [facility];  Service: Anesthesiology;  Laterality: N/A;  . RADIOFREQUENCY ABLATION KIDNEY  2010-2012   "twice; one on each side; for cancer"  . REVISON OF ARTERIOVENOUS FISTULA Left 09/01/2015   Procedure: EXPLORATION LEFT LOWER ARM  RADIOCEPHALIC ARTERIOVENOUS FISTULA;  Surgeon: Conrad [place], MD;  Location: [place];  Service: Vascular;  Laterality: Left;  [name] VARICOSE VEIN SURGERY  mid 1980's    BLE; "knees down; both legs; 2 separate times"    Allergies: Codeine and Tape  Medications: Prior to Admission medications   Medication Sig Start  Date End Date Taking? Authorizing Provider  aspirin EC 81 MG tablet Take 81 mg by mouth at bedtime.    [provider]  atorvastatin (LIPITOR) 10 MG tablet Take 10 mg by mouth daily.    [provider]  Methoxy PEG-Epoetin Beta (MIRCERA) 50 MCG/0.3ML SOSY Inject 225 mcg as directed every 14 (fourteen) days.     [provider]  midodrine (PROAMATINE) 10 MG tablet Take 1 tablet (10 mg total) by mouth 3 (three) times daily with meals. 11/05/15   Reyne Dumas, MD  multivitamin (RENA-VIT) TABS tablet Take 1 tablet by mouth daily.    [provider]  Probiotic Product (ALIGN) 4 MG CAPS Take 1 capsule by mouth daily.    [provider]  sevelamer carbonate (RENVELA) 800 MG tablet Take 2,400-4,800 mg by mouth See admin instructions. Take 6 tablets (4800 mg) by mouth 3 times daily with meals and 3 tablets (2400 mg) 2 times daily with snacks 06/26/14   [provider]     Family History  Problem Relation Age of Onset  . Cancer Mother 20       breast, spine and ovarian  . Heart disease Mother   . Hyperlipidemia Mother   . Hypertension Mother   . Varicose Veins Mother   . Emphysema Father   . Cancer Father 46       kidney, prostate  . Heart disease Father   . Hyperlipidemia Father   . Hypertension Father   . Cancer Sister        ovarian  . Cancer Brother        lung  . Heart disease Brother   . Hyperlipidemia Brother   . Hypertension Brother   . Heart attack Brother   . Peripheral vascular disease Brother   . Cancer Unknown   . Coronary artery disease Unknown   . Kidney disease Unknown   . Breast cancer Unknown   . Ovarian cancer Unknown   . Lung cancer Unknown   . Diabetes Unknown   . Malignant hyperthermia Neg Hx     Social History   Socioeconomic History  . Marital status: Married    Spouse name: None  . Number of children: None  . Years of education: None  . Highest education level: None  Social Needs  . Financial  resource strain: None  . Food insecurity - worry: None  . Food insecurity - inability: None  . Transportation needs - medical: None  . Transportation needs - non-medical: None  Occupational History  . None  Tobacco Use  . Smoking status: Never Smoker  . Smokeless tobacco: Never Used  Substance and Sexual Activity  . Alcohol use: No    Alcohol/week: 0.0 oz  . Drug use: No  . Sexual activity: No  Other Topics Concern  . None  Social History Narrative  . None    Review of Systems: A 12 point ROS discussed and pertinent positives are indicated in the HPI above.  All other systems are negative. Review of Systems  Vital Signs: BP (!) 122/58   Pulse 60   Temp 97.8 F (36.6 C) (Oral)   Resp 15   Ht 6\' 4"  (1.93 m)   Wt 195 lb (88.5 kg)   SpO2 98%   BMI 23.74 kg/m   Physical Exam  Constitutional: He is oriented to person, place, and time. He appears well-developed.  HENT:  Head: Normocephalic and atraumatic.  Eyes: EOM are normal.  Neck: Normal range of motion.  Cardiovascular: Normal rate, regular rhythm and normal heart sounds.  Pulmonary/Chest: Effort normal and breath sounds normal.  Abdominal: Soft. He exhibits no distension. There is no tenderness.  Musculoskeletal: Normal range of motion.  Neurological: He is alert and oriented to person, place, and time.  Skin: Skin is warm and dry.  Psychiatric: He has a normal mood and affect. His behavior is normal. Judgment and thought content normal.  Puncture site is completed healed and non-visible.   Imaging: Ct Guide Tissue Ablation  Result Date: 04/19/2017 CLINICAL DATA:  History of bilateral renal cell carcinoma and status post right nephrectomy with evidence by imaging of nodular recurrence of renal carcinoma in the nephrectomy bed. He presents for ablation of tumor recurrence. EXAM: CT-GUIDED PERCUTANEOUS THERMAL ABLATION OF RECURRENT RIGHT RENAL CARCINOMA COMPARISON:  PET scan on 02/28/2017 and additional prior  imaging. ANESTHESIA/SEDATION: Anesthesia:  General Medications: 2 g IV Ancef. The antibiotic was administered in an appropriate time interval prior to needle puncture of the skin. CONTRAST:  None. PROCEDURE: The procedure, risks, benefits, and alternatives were explained to the patient. Questions regarding the procedure were encouraged and answered. The patient understands and consents to the procedure. The patient was placed under general anesthesia. A time-out was performed. Initial unenhanced CT was performed in a prone position to localize the right retroperitoneal tumor. The right flank region was prepped with chlorhexidine in a sterile fashion, and a sterile drape was applied covering the operative field. A sterile gown and sterile gloves were used for the procedure. Under CT guidance, a NeuWave XT thermal ablation probe was advanced into the right retroperitoneum. Probe positioning was confirmed by CT prior to ablation. Hydrodissection was also performed during the procedure utilizing a solution of 250 mL of sterile saline mixed with 5 mL of Isovue-300 contrast. Three separate areas of recurrent carcinoma were treated utilizing the ablation probe. Three separate 5 minute cycles of ablation were performed at 65 watts. COMPLICATIONS: None FINDINGS: Nodular and elongated area of tumor recurrence were isolated by CT and have a similar appearance to recent imaging. Areas of hypermetabolic tissue by PET scan were targeted and treated with thermal ablation. IMPRESSION: CT guided percutaneous thermal ablation of recurrent right renal carcinoma. The patient will be observed overnight. Initial follow-up will be performed in approximately 4 weeks. Electronically Signed   By: Aletta Edouard M.D.   On: 04/19/2017 12:34    Labs:  CBC: Recent Labs    04/19/17 0701 04/20/17 0410  WBC 4.8 3.3*  HGB 10.7*  9.8*  HCT 33.1* 29.8*  PLT 104* 89*    COAGS: Recent Labs    04/19/17 0701  INR 1.06     BMP: Recent Labs    04/19/17 0701  NA 142  K 3.8  CL 102  CO2 27  GLUCOSE 124*  BUN 95*  CALCIUM 9.2  CREATININE 7.37*  GFRNONAA 7*  GFRAA 8*    LIVER FUNCTION TESTS: No results for input(s): BILITOT, AST, ALT, ALKPHOS, PROT, ALBUMIN in the last 8760 hours.  TUMOR MARKERS: No results for input(s): AFPTM, CEA, CA199, CHROMGRNA in the last 8760 hours.  Assessment:  Recurrent clear cell cancer  S/P thermal ablation by [name] on 04/19/2017.  Patient seen by [name] today.  Will repeat PET scan late March.   Electronically Signed: Murrell Redden PA-C 05/18/2017, 3:54 PM   Please refer to Dr. Margaretmary Dys attestation of this note for management and plan.

## 2017-05-19 ENCOUNTER — Encounter: Payer: Self-pay | Admitting: Podiatry

## 2017-05-19 ENCOUNTER — Ambulatory Visit (INDEPENDENT_AMBULATORY_CARE_PROVIDER_SITE_OTHER): Payer: Medicare Other | Admitting: Podiatry

## 2017-05-19 DIAGNOSIS — D509 Iron deficiency anemia, unspecified: Secondary | ICD-10-CM | POA: Diagnosis not present

## 2017-05-19 DIAGNOSIS — K7689 Other specified diseases of liver: Secondary | ICD-10-CM | POA: Diagnosis not present

## 2017-05-19 DIAGNOSIS — B351 Tinea unguium: Secondary | ICD-10-CM | POA: Diagnosis not present

## 2017-05-19 DIAGNOSIS — N186 End stage renal disease: Secondary | ICD-10-CM | POA: Diagnosis not present

## 2017-05-19 DIAGNOSIS — M79674 Pain in right toe(s): Secondary | ICD-10-CM | POA: Diagnosis not present

## 2017-05-19 DIAGNOSIS — D631 Anemia in chronic kidney disease: Secondary | ICD-10-CM | POA: Diagnosis not present

## 2017-05-19 DIAGNOSIS — IMO0002 Reserved for concepts with insufficient information to code with codable children: Secondary | ICD-10-CM

## 2017-05-19 DIAGNOSIS — M79675 Pain in left toe(s): Secondary | ICD-10-CM

## 2017-05-19 DIAGNOSIS — E1159 Type 2 diabetes mellitus with other circulatory complications: Secondary | ICD-10-CM

## 2017-05-19 DIAGNOSIS — N2581 Secondary hyperparathyroidism of renal origin: Secondary | ICD-10-CM | POA: Diagnosis not present

## 2017-05-19 DIAGNOSIS — E119 Type 2 diabetes mellitus without complications: Secondary | ICD-10-CM | POA: Diagnosis not present

## 2017-05-19 DIAGNOSIS — Z89431 Acquired absence of right foot: Secondary | ICD-10-CM

## 2017-05-19 NOTE — Progress Notes (Signed)
This patient presents to the office today with thick disfigured discolored toenails on his left foot.  Patient has a history of a transmetatarsal amputation right foot.  This patient is also blind.  Patient states the nails on the toes on his left foot are painful walking and wearing his shoes.  He is unable to self treat.  He presents to the office for preventative foot care services.   General Appearance  Alert, conversant and in no acute stress.  Vascular  Dorsalis pedis and posterior pulses are weakly  palpable  bilaterally.  Capillary return is within normal limits  bilaterally. Temperature is within normal limits  Bilaterally.  Neurologic  Senn-Weinstein monofilament wire test deferred.  Nails Thick disfigured discolored nails with subungual debris  from hallux to fifth toes  Left foot.. No evidence of bacterial infection or drainage bilaterally.  Orthopedic  No limitations of motion of motion feet bilaterally.  No crepitus or effusions noted.  No bony pathology or digital deformities noted. TMA right foot.  Skin  normotropic skin with no porokeratosis noted bilaterally.  No signs of infections or ulcers noted.     Onychomycosis  Left foot   ROV  Debridement of long painful nails left foot x 5.  RTC 3 months.  A prescription was given to  this patient to dispense an electric lift chair.     Gardiner Barefoot DPM

## 2017-05-22 DIAGNOSIS — E119 Type 2 diabetes mellitus without complications: Secondary | ICD-10-CM | POA: Diagnosis not present

## 2017-05-22 DIAGNOSIS — N2581 Secondary hyperparathyroidism of renal origin: Secondary | ICD-10-CM | POA: Diagnosis not present

## 2017-05-22 DIAGNOSIS — D509 Iron deficiency anemia, unspecified: Secondary | ICD-10-CM | POA: Diagnosis not present

## 2017-05-22 DIAGNOSIS — K7689 Other specified diseases of liver: Secondary | ICD-10-CM | POA: Diagnosis not present

## 2017-05-22 DIAGNOSIS — N186 End stage renal disease: Secondary | ICD-10-CM | POA: Diagnosis not present

## 2017-05-22 DIAGNOSIS — D631 Anemia in chronic kidney disease: Secondary | ICD-10-CM | POA: Diagnosis not present

## 2017-05-24 DIAGNOSIS — E119 Type 2 diabetes mellitus without complications: Secondary | ICD-10-CM | POA: Diagnosis not present

## 2017-05-24 DIAGNOSIS — D631 Anemia in chronic kidney disease: Secondary | ICD-10-CM | POA: Diagnosis not present

## 2017-05-24 DIAGNOSIS — K7689 Other specified diseases of liver: Secondary | ICD-10-CM | POA: Diagnosis not present

## 2017-05-24 DIAGNOSIS — N186 End stage renal disease: Secondary | ICD-10-CM | POA: Diagnosis not present

## 2017-05-24 DIAGNOSIS — N2581 Secondary hyperparathyroidism of renal origin: Secondary | ICD-10-CM | POA: Diagnosis not present

## 2017-05-24 DIAGNOSIS — D509 Iron deficiency anemia, unspecified: Secondary | ICD-10-CM | POA: Diagnosis not present

## 2017-05-26 DIAGNOSIS — N2581 Secondary hyperparathyroidism of renal origin: Secondary | ICD-10-CM | POA: Diagnosis not present

## 2017-05-26 DIAGNOSIS — N186 End stage renal disease: Secondary | ICD-10-CM | POA: Diagnosis not present

## 2017-05-26 DIAGNOSIS — D631 Anemia in chronic kidney disease: Secondary | ICD-10-CM | POA: Diagnosis not present

## 2017-05-26 DIAGNOSIS — D509 Iron deficiency anemia, unspecified: Secondary | ICD-10-CM | POA: Diagnosis not present

## 2017-05-26 DIAGNOSIS — E119 Type 2 diabetes mellitus without complications: Secondary | ICD-10-CM | POA: Diagnosis not present

## 2017-05-26 DIAGNOSIS — K7689 Other specified diseases of liver: Secondary | ICD-10-CM | POA: Diagnosis not present

## 2017-05-29 DIAGNOSIS — K7689 Other specified diseases of liver: Secondary | ICD-10-CM | POA: Diagnosis not present

## 2017-05-29 DIAGNOSIS — E119 Type 2 diabetes mellitus without complications: Secondary | ICD-10-CM | POA: Diagnosis not present

## 2017-05-29 DIAGNOSIS — N2581 Secondary hyperparathyroidism of renal origin: Secondary | ICD-10-CM | POA: Diagnosis not present

## 2017-05-29 DIAGNOSIS — D509 Iron deficiency anemia, unspecified: Secondary | ICD-10-CM | POA: Diagnosis not present

## 2017-05-29 DIAGNOSIS — N186 End stage renal disease: Secondary | ICD-10-CM | POA: Diagnosis not present

## 2017-05-29 DIAGNOSIS — D631 Anemia in chronic kidney disease: Secondary | ICD-10-CM | POA: Diagnosis not present

## 2017-05-31 DIAGNOSIS — N2581 Secondary hyperparathyroidism of renal origin: Secondary | ICD-10-CM | POA: Diagnosis not present

## 2017-05-31 DIAGNOSIS — N186 End stage renal disease: Secondary | ICD-10-CM | POA: Diagnosis not present

## 2017-05-31 DIAGNOSIS — D631 Anemia in chronic kidney disease: Secondary | ICD-10-CM | POA: Diagnosis not present

## 2017-05-31 DIAGNOSIS — E119 Type 2 diabetes mellitus without complications: Secondary | ICD-10-CM | POA: Diagnosis not present

## 2017-05-31 DIAGNOSIS — D509 Iron deficiency anemia, unspecified: Secondary | ICD-10-CM | POA: Diagnosis not present

## 2017-05-31 DIAGNOSIS — K7689 Other specified diseases of liver: Secondary | ICD-10-CM | POA: Diagnosis not present

## 2017-06-02 DIAGNOSIS — E1129 Type 2 diabetes mellitus with other diabetic kidney complication: Secondary | ICD-10-CM | POA: Diagnosis not present

## 2017-06-02 DIAGNOSIS — N2581 Secondary hyperparathyroidism of renal origin: Secondary | ICD-10-CM | POA: Diagnosis not present

## 2017-06-02 DIAGNOSIS — E119 Type 2 diabetes mellitus without complications: Secondary | ICD-10-CM | POA: Diagnosis not present

## 2017-06-02 DIAGNOSIS — D631 Anemia in chronic kidney disease: Secondary | ICD-10-CM | POA: Diagnosis not present

## 2017-06-02 DIAGNOSIS — Z992 Dependence on renal dialysis: Secondary | ICD-10-CM | POA: Diagnosis not present

## 2017-06-02 DIAGNOSIS — D509 Iron deficiency anemia, unspecified: Secondary | ICD-10-CM | POA: Diagnosis not present

## 2017-06-02 DIAGNOSIS — K7689 Other specified diseases of liver: Secondary | ICD-10-CM | POA: Diagnosis not present

## 2017-06-02 DIAGNOSIS — N186 End stage renal disease: Secondary | ICD-10-CM | POA: Diagnosis not present

## 2017-06-05 DIAGNOSIS — D631 Anemia in chronic kidney disease: Secondary | ICD-10-CM | POA: Diagnosis not present

## 2017-06-05 DIAGNOSIS — K7689 Other specified diseases of liver: Secondary | ICD-10-CM | POA: Diagnosis not present

## 2017-06-05 DIAGNOSIS — D509 Iron deficiency anemia, unspecified: Secondary | ICD-10-CM | POA: Diagnosis not present

## 2017-06-05 DIAGNOSIS — E119 Type 2 diabetes mellitus without complications: Secondary | ICD-10-CM | POA: Diagnosis not present

## 2017-06-05 DIAGNOSIS — N186 End stage renal disease: Secondary | ICD-10-CM | POA: Diagnosis not present

## 2017-06-05 DIAGNOSIS — N2581 Secondary hyperparathyroidism of renal origin: Secondary | ICD-10-CM | POA: Diagnosis not present

## 2017-06-07 DIAGNOSIS — D631 Anemia in chronic kidney disease: Secondary | ICD-10-CM | POA: Diagnosis not present

## 2017-06-07 DIAGNOSIS — D509 Iron deficiency anemia, unspecified: Secondary | ICD-10-CM | POA: Diagnosis not present

## 2017-06-07 DIAGNOSIS — N2581 Secondary hyperparathyroidism of renal origin: Secondary | ICD-10-CM | POA: Diagnosis not present

## 2017-06-07 DIAGNOSIS — E119 Type 2 diabetes mellitus without complications: Secondary | ICD-10-CM | POA: Diagnosis not present

## 2017-06-07 DIAGNOSIS — K7689 Other specified diseases of liver: Secondary | ICD-10-CM | POA: Diagnosis not present

## 2017-06-07 DIAGNOSIS — N186 End stage renal disease: Secondary | ICD-10-CM | POA: Diagnosis not present

## 2017-06-09 ENCOUNTER — Encounter: Payer: Self-pay | Admitting: Gastroenterology

## 2017-06-09 DIAGNOSIS — K7689 Other specified diseases of liver: Secondary | ICD-10-CM | POA: Diagnosis not present

## 2017-06-09 DIAGNOSIS — D631 Anemia in chronic kidney disease: Secondary | ICD-10-CM | POA: Diagnosis not present

## 2017-06-09 DIAGNOSIS — N2581 Secondary hyperparathyroidism of renal origin: Secondary | ICD-10-CM | POA: Diagnosis not present

## 2017-06-09 DIAGNOSIS — N186 End stage renal disease: Secondary | ICD-10-CM | POA: Diagnosis not present

## 2017-06-09 DIAGNOSIS — E119 Type 2 diabetes mellitus without complications: Secondary | ICD-10-CM | POA: Diagnosis not present

## 2017-06-09 DIAGNOSIS — D509 Iron deficiency anemia, unspecified: Secondary | ICD-10-CM | POA: Diagnosis not present

## 2017-06-12 DIAGNOSIS — D631 Anemia in chronic kidney disease: Secondary | ICD-10-CM | POA: Diagnosis not present

## 2017-06-12 DIAGNOSIS — D509 Iron deficiency anemia, unspecified: Secondary | ICD-10-CM | POA: Diagnosis not present

## 2017-06-12 DIAGNOSIS — N186 End stage renal disease: Secondary | ICD-10-CM | POA: Diagnosis not present

## 2017-06-12 DIAGNOSIS — K7689 Other specified diseases of liver: Secondary | ICD-10-CM | POA: Diagnosis not present

## 2017-06-12 DIAGNOSIS — E119 Type 2 diabetes mellitus without complications: Secondary | ICD-10-CM | POA: Diagnosis not present

## 2017-06-12 DIAGNOSIS — N2581 Secondary hyperparathyroidism of renal origin: Secondary | ICD-10-CM | POA: Diagnosis not present

## 2017-06-14 DIAGNOSIS — N2581 Secondary hyperparathyroidism of renal origin: Secondary | ICD-10-CM | POA: Diagnosis not present

## 2017-06-14 DIAGNOSIS — D631 Anemia in chronic kidney disease: Secondary | ICD-10-CM | POA: Diagnosis not present

## 2017-06-14 DIAGNOSIS — E119 Type 2 diabetes mellitus without complications: Secondary | ICD-10-CM | POA: Diagnosis not present

## 2017-06-14 DIAGNOSIS — K7689 Other specified diseases of liver: Secondary | ICD-10-CM | POA: Diagnosis not present

## 2017-06-14 DIAGNOSIS — N186 End stage renal disease: Secondary | ICD-10-CM | POA: Diagnosis not present

## 2017-06-14 DIAGNOSIS — D509 Iron deficiency anemia, unspecified: Secondary | ICD-10-CM | POA: Diagnosis not present

## 2017-06-15 DIAGNOSIS — E1129 Type 2 diabetes mellitus with other diabetic kidney complication: Secondary | ICD-10-CM | POA: Diagnosis not present

## 2017-06-15 DIAGNOSIS — Z992 Dependence on renal dialysis: Secondary | ICD-10-CM | POA: Diagnosis not present

## 2017-06-15 DIAGNOSIS — C7900 Secondary malignant neoplasm of unspecified kidney and renal pelvis: Secondary | ICD-10-CM | POA: Diagnosis not present

## 2017-06-15 DIAGNOSIS — E1139 Type 2 diabetes mellitus with other diabetic ophthalmic complication: Secondary | ICD-10-CM | POA: Diagnosis not present

## 2017-06-15 DIAGNOSIS — N186 End stage renal disease: Secondary | ICD-10-CM | POA: Diagnosis not present

## 2017-06-15 DIAGNOSIS — E7849 Other hyperlipidemia: Secondary | ICD-10-CM | POA: Diagnosis not present

## 2017-06-15 DIAGNOSIS — I25810 Atherosclerosis of coronary artery bypass graft(s) without angina pectoris: Secondary | ICD-10-CM | POA: Diagnosis not present

## 2017-06-15 DIAGNOSIS — I10 Essential (primary) hypertension: Secondary | ICD-10-CM | POA: Diagnosis not present

## 2017-06-15 DIAGNOSIS — E1151 Type 2 diabetes mellitus with diabetic peripheral angiopathy without gangrene: Secondary | ICD-10-CM | POA: Diagnosis not present

## 2017-06-15 DIAGNOSIS — Z6825 Body mass index (BMI) 25.0-25.9, adult: Secondary | ICD-10-CM | POA: Diagnosis not present

## 2017-06-16 DIAGNOSIS — E119 Type 2 diabetes mellitus without complications: Secondary | ICD-10-CM | POA: Diagnosis not present

## 2017-06-16 DIAGNOSIS — N186 End stage renal disease: Secondary | ICD-10-CM | POA: Diagnosis not present

## 2017-06-16 DIAGNOSIS — D631 Anemia in chronic kidney disease: Secondary | ICD-10-CM | POA: Diagnosis not present

## 2017-06-16 DIAGNOSIS — K7689 Other specified diseases of liver: Secondary | ICD-10-CM | POA: Diagnosis not present

## 2017-06-16 DIAGNOSIS — D509 Iron deficiency anemia, unspecified: Secondary | ICD-10-CM | POA: Diagnosis not present

## 2017-06-16 DIAGNOSIS — N2581 Secondary hyperparathyroidism of renal origin: Secondary | ICD-10-CM | POA: Diagnosis not present

## 2017-06-19 DIAGNOSIS — D631 Anemia in chronic kidney disease: Secondary | ICD-10-CM | POA: Diagnosis not present

## 2017-06-19 DIAGNOSIS — K7689 Other specified diseases of liver: Secondary | ICD-10-CM | POA: Diagnosis not present

## 2017-06-19 DIAGNOSIS — E119 Type 2 diabetes mellitus without complications: Secondary | ICD-10-CM | POA: Diagnosis not present

## 2017-06-19 DIAGNOSIS — N2581 Secondary hyperparathyroidism of renal origin: Secondary | ICD-10-CM | POA: Diagnosis not present

## 2017-06-19 DIAGNOSIS — N186 End stage renal disease: Secondary | ICD-10-CM | POA: Diagnosis not present

## 2017-06-19 DIAGNOSIS — D509 Iron deficiency anemia, unspecified: Secondary | ICD-10-CM | POA: Diagnosis not present

## 2017-06-21 DIAGNOSIS — E119 Type 2 diabetes mellitus without complications: Secondary | ICD-10-CM | POA: Diagnosis not present

## 2017-06-21 DIAGNOSIS — N2581 Secondary hyperparathyroidism of renal origin: Secondary | ICD-10-CM | POA: Diagnosis not present

## 2017-06-21 DIAGNOSIS — K7689 Other specified diseases of liver: Secondary | ICD-10-CM | POA: Diagnosis not present

## 2017-06-21 DIAGNOSIS — D631 Anemia in chronic kidney disease: Secondary | ICD-10-CM | POA: Diagnosis not present

## 2017-06-21 DIAGNOSIS — N186 End stage renal disease: Secondary | ICD-10-CM | POA: Diagnosis not present

## 2017-06-21 DIAGNOSIS — D509 Iron deficiency anemia, unspecified: Secondary | ICD-10-CM | POA: Diagnosis not present

## 2017-06-23 DIAGNOSIS — D631 Anemia in chronic kidney disease: Secondary | ICD-10-CM | POA: Diagnosis not present

## 2017-06-23 DIAGNOSIS — E119 Type 2 diabetes mellitus without complications: Secondary | ICD-10-CM | POA: Diagnosis not present

## 2017-06-23 DIAGNOSIS — D509 Iron deficiency anemia, unspecified: Secondary | ICD-10-CM | POA: Diagnosis not present

## 2017-06-23 DIAGNOSIS — N186 End stage renal disease: Secondary | ICD-10-CM | POA: Diagnosis not present

## 2017-06-23 DIAGNOSIS — N2581 Secondary hyperparathyroidism of renal origin: Secondary | ICD-10-CM | POA: Diagnosis not present

## 2017-06-23 DIAGNOSIS — K7689 Other specified diseases of liver: Secondary | ICD-10-CM | POA: Diagnosis not present

## 2017-06-26 DIAGNOSIS — K7689 Other specified diseases of liver: Secondary | ICD-10-CM | POA: Diagnosis not present

## 2017-06-26 DIAGNOSIS — D509 Iron deficiency anemia, unspecified: Secondary | ICD-10-CM | POA: Diagnosis not present

## 2017-06-26 DIAGNOSIS — E119 Type 2 diabetes mellitus without complications: Secondary | ICD-10-CM | POA: Diagnosis not present

## 2017-06-26 DIAGNOSIS — D631 Anemia in chronic kidney disease: Secondary | ICD-10-CM | POA: Diagnosis not present

## 2017-06-26 DIAGNOSIS — N186 End stage renal disease: Secondary | ICD-10-CM | POA: Diagnosis not present

## 2017-06-26 DIAGNOSIS — N2581 Secondary hyperparathyroidism of renal origin: Secondary | ICD-10-CM | POA: Diagnosis not present

## 2017-06-28 DIAGNOSIS — K7689 Other specified diseases of liver: Secondary | ICD-10-CM | POA: Diagnosis not present

## 2017-06-28 DIAGNOSIS — E119 Type 2 diabetes mellitus without complications: Secondary | ICD-10-CM | POA: Diagnosis not present

## 2017-06-28 DIAGNOSIS — N2581 Secondary hyperparathyroidism of renal origin: Secondary | ICD-10-CM | POA: Diagnosis not present

## 2017-06-28 DIAGNOSIS — D631 Anemia in chronic kidney disease: Secondary | ICD-10-CM | POA: Diagnosis not present

## 2017-06-28 DIAGNOSIS — N186 End stage renal disease: Secondary | ICD-10-CM | POA: Diagnosis not present

## 2017-06-28 DIAGNOSIS — D509 Iron deficiency anemia, unspecified: Secondary | ICD-10-CM | POA: Diagnosis not present

## 2017-06-30 DIAGNOSIS — E119 Type 2 diabetes mellitus without complications: Secondary | ICD-10-CM | POA: Diagnosis not present

## 2017-06-30 DIAGNOSIS — K7689 Other specified diseases of liver: Secondary | ICD-10-CM | POA: Diagnosis not present

## 2017-06-30 DIAGNOSIS — N186 End stage renal disease: Secondary | ICD-10-CM | POA: Diagnosis not present

## 2017-06-30 DIAGNOSIS — D509 Iron deficiency anemia, unspecified: Secondary | ICD-10-CM | POA: Diagnosis not present

## 2017-06-30 DIAGNOSIS — D631 Anemia in chronic kidney disease: Secondary | ICD-10-CM | POA: Diagnosis not present

## 2017-06-30 DIAGNOSIS — N2581 Secondary hyperparathyroidism of renal origin: Secondary | ICD-10-CM | POA: Diagnosis not present

## 2017-07-03 ENCOUNTER — Other Ambulatory Visit (HOSPITAL_COMMUNITY): Payer: Self-pay | Admitting: Interventional Radiology

## 2017-07-03 DIAGNOSIS — K7689 Other specified diseases of liver: Secondary | ICD-10-CM | POA: Diagnosis not present

## 2017-07-03 DIAGNOSIS — C641 Malignant neoplasm of right kidney, except renal pelvis: Secondary | ICD-10-CM

## 2017-07-03 DIAGNOSIS — N2581 Secondary hyperparathyroidism of renal origin: Secondary | ICD-10-CM | POA: Diagnosis not present

## 2017-07-03 DIAGNOSIS — Z992 Dependence on renal dialysis: Secondary | ICD-10-CM | POA: Diagnosis not present

## 2017-07-03 DIAGNOSIS — E119 Type 2 diabetes mellitus without complications: Secondary | ICD-10-CM | POA: Diagnosis not present

## 2017-07-03 DIAGNOSIS — D631 Anemia in chronic kidney disease: Secondary | ICD-10-CM | POA: Diagnosis not present

## 2017-07-03 DIAGNOSIS — E1129 Type 2 diabetes mellitus with other diabetic kidney complication: Secondary | ICD-10-CM | POA: Diagnosis not present

## 2017-07-03 DIAGNOSIS — N186 End stage renal disease: Secondary | ICD-10-CM | POA: Diagnosis not present

## 2017-07-03 DIAGNOSIS — D509 Iron deficiency anemia, unspecified: Secondary | ICD-10-CM | POA: Diagnosis not present

## 2017-07-05 DIAGNOSIS — D631 Anemia in chronic kidney disease: Secondary | ICD-10-CM | POA: Diagnosis not present

## 2017-07-05 DIAGNOSIS — K7689 Other specified diseases of liver: Secondary | ICD-10-CM | POA: Diagnosis not present

## 2017-07-05 DIAGNOSIS — D509 Iron deficiency anemia, unspecified: Secondary | ICD-10-CM | POA: Diagnosis not present

## 2017-07-05 DIAGNOSIS — N186 End stage renal disease: Secondary | ICD-10-CM | POA: Diagnosis not present

## 2017-07-05 DIAGNOSIS — N2581 Secondary hyperparathyroidism of renal origin: Secondary | ICD-10-CM | POA: Diagnosis not present

## 2017-07-05 DIAGNOSIS — E119 Type 2 diabetes mellitus without complications: Secondary | ICD-10-CM | POA: Diagnosis not present

## 2017-07-07 DIAGNOSIS — N2581 Secondary hyperparathyroidism of renal origin: Secondary | ICD-10-CM | POA: Diagnosis not present

## 2017-07-07 DIAGNOSIS — D631 Anemia in chronic kidney disease: Secondary | ICD-10-CM | POA: Diagnosis not present

## 2017-07-07 DIAGNOSIS — N186 End stage renal disease: Secondary | ICD-10-CM | POA: Diagnosis not present

## 2017-07-07 DIAGNOSIS — D509 Iron deficiency anemia, unspecified: Secondary | ICD-10-CM | POA: Diagnosis not present

## 2017-07-07 DIAGNOSIS — E119 Type 2 diabetes mellitus without complications: Secondary | ICD-10-CM | POA: Diagnosis not present

## 2017-07-07 DIAGNOSIS — K7689 Other specified diseases of liver: Secondary | ICD-10-CM | POA: Diagnosis not present

## 2017-07-10 DIAGNOSIS — E119 Type 2 diabetes mellitus without complications: Secondary | ICD-10-CM | POA: Diagnosis not present

## 2017-07-10 DIAGNOSIS — D509 Iron deficiency anemia, unspecified: Secondary | ICD-10-CM | POA: Diagnosis not present

## 2017-07-10 DIAGNOSIS — N2581 Secondary hyperparathyroidism of renal origin: Secondary | ICD-10-CM | POA: Diagnosis not present

## 2017-07-10 DIAGNOSIS — N186 End stage renal disease: Secondary | ICD-10-CM | POA: Diagnosis not present

## 2017-07-10 DIAGNOSIS — D631 Anemia in chronic kidney disease: Secondary | ICD-10-CM | POA: Diagnosis not present

## 2017-07-10 DIAGNOSIS — K7689 Other specified diseases of liver: Secondary | ICD-10-CM | POA: Diagnosis not present

## 2017-07-12 DIAGNOSIS — N186 End stage renal disease: Secondary | ICD-10-CM | POA: Diagnosis not present

## 2017-07-12 DIAGNOSIS — E119 Type 2 diabetes mellitus without complications: Secondary | ICD-10-CM | POA: Diagnosis not present

## 2017-07-12 DIAGNOSIS — D631 Anemia in chronic kidney disease: Secondary | ICD-10-CM | POA: Diagnosis not present

## 2017-07-12 DIAGNOSIS — D509 Iron deficiency anemia, unspecified: Secondary | ICD-10-CM | POA: Diagnosis not present

## 2017-07-12 DIAGNOSIS — E1129 Type 2 diabetes mellitus with other diabetic kidney complication: Secondary | ICD-10-CM | POA: Diagnosis not present

## 2017-07-12 DIAGNOSIS — K7689 Other specified diseases of liver: Secondary | ICD-10-CM | POA: Diagnosis not present

## 2017-07-12 DIAGNOSIS — N2581 Secondary hyperparathyroidism of renal origin: Secondary | ICD-10-CM | POA: Diagnosis not present

## 2017-07-14 DIAGNOSIS — N186 End stage renal disease: Secondary | ICD-10-CM | POA: Diagnosis not present

## 2017-07-14 DIAGNOSIS — K7689 Other specified diseases of liver: Secondary | ICD-10-CM | POA: Diagnosis not present

## 2017-07-14 DIAGNOSIS — N2581 Secondary hyperparathyroidism of renal origin: Secondary | ICD-10-CM | POA: Diagnosis not present

## 2017-07-14 DIAGNOSIS — E119 Type 2 diabetes mellitus without complications: Secondary | ICD-10-CM | POA: Diagnosis not present

## 2017-07-14 DIAGNOSIS — D509 Iron deficiency anemia, unspecified: Secondary | ICD-10-CM | POA: Diagnosis not present

## 2017-07-14 DIAGNOSIS — D631 Anemia in chronic kidney disease: Secondary | ICD-10-CM | POA: Diagnosis not present

## 2017-07-17 DIAGNOSIS — N2581 Secondary hyperparathyroidism of renal origin: Secondary | ICD-10-CM | POA: Diagnosis not present

## 2017-07-17 DIAGNOSIS — D631 Anemia in chronic kidney disease: Secondary | ICD-10-CM | POA: Diagnosis not present

## 2017-07-17 DIAGNOSIS — N186 End stage renal disease: Secondary | ICD-10-CM | POA: Diagnosis not present

## 2017-07-17 DIAGNOSIS — K7689 Other specified diseases of liver: Secondary | ICD-10-CM | POA: Diagnosis not present

## 2017-07-17 DIAGNOSIS — D509 Iron deficiency anemia, unspecified: Secondary | ICD-10-CM | POA: Diagnosis not present

## 2017-07-17 DIAGNOSIS — E119 Type 2 diabetes mellitus without complications: Secondary | ICD-10-CM | POA: Diagnosis not present

## 2017-07-19 DIAGNOSIS — N2581 Secondary hyperparathyroidism of renal origin: Secondary | ICD-10-CM | POA: Diagnosis not present

## 2017-07-19 DIAGNOSIS — K7689 Other specified diseases of liver: Secondary | ICD-10-CM | POA: Diagnosis not present

## 2017-07-19 DIAGNOSIS — D509 Iron deficiency anemia, unspecified: Secondary | ICD-10-CM | POA: Diagnosis not present

## 2017-07-19 DIAGNOSIS — D631 Anemia in chronic kidney disease: Secondary | ICD-10-CM | POA: Diagnosis not present

## 2017-07-19 DIAGNOSIS — N186 End stage renal disease: Secondary | ICD-10-CM | POA: Diagnosis not present

## 2017-07-19 DIAGNOSIS — E119 Type 2 diabetes mellitus without complications: Secondary | ICD-10-CM | POA: Diagnosis not present

## 2017-07-21 DIAGNOSIS — K7689 Other specified diseases of liver: Secondary | ICD-10-CM | POA: Diagnosis not present

## 2017-07-21 DIAGNOSIS — N2581 Secondary hyperparathyroidism of renal origin: Secondary | ICD-10-CM | POA: Diagnosis not present

## 2017-07-21 DIAGNOSIS — D509 Iron deficiency anemia, unspecified: Secondary | ICD-10-CM | POA: Diagnosis not present

## 2017-07-21 DIAGNOSIS — E119 Type 2 diabetes mellitus without complications: Secondary | ICD-10-CM | POA: Diagnosis not present

## 2017-07-21 DIAGNOSIS — D631 Anemia in chronic kidney disease: Secondary | ICD-10-CM | POA: Diagnosis not present

## 2017-07-21 DIAGNOSIS — N186 End stage renal disease: Secondary | ICD-10-CM | POA: Diagnosis not present

## 2017-07-24 DIAGNOSIS — D631 Anemia in chronic kidney disease: Secondary | ICD-10-CM | POA: Diagnosis not present

## 2017-07-24 DIAGNOSIS — N2581 Secondary hyperparathyroidism of renal origin: Secondary | ICD-10-CM | POA: Diagnosis not present

## 2017-07-24 DIAGNOSIS — D509 Iron deficiency anemia, unspecified: Secondary | ICD-10-CM | POA: Diagnosis not present

## 2017-07-24 DIAGNOSIS — K7689 Other specified diseases of liver: Secondary | ICD-10-CM | POA: Diagnosis not present

## 2017-07-24 DIAGNOSIS — N186 End stage renal disease: Secondary | ICD-10-CM | POA: Diagnosis not present

## 2017-07-24 DIAGNOSIS — E119 Type 2 diabetes mellitus without complications: Secondary | ICD-10-CM | POA: Diagnosis not present

## 2017-07-26 DIAGNOSIS — N186 End stage renal disease: Secondary | ICD-10-CM | POA: Diagnosis not present

## 2017-07-26 DIAGNOSIS — E119 Type 2 diabetes mellitus without complications: Secondary | ICD-10-CM | POA: Diagnosis not present

## 2017-07-26 DIAGNOSIS — K7689 Other specified diseases of liver: Secondary | ICD-10-CM | POA: Diagnosis not present

## 2017-07-26 DIAGNOSIS — D631 Anemia in chronic kidney disease: Secondary | ICD-10-CM | POA: Diagnosis not present

## 2017-07-26 DIAGNOSIS — D509 Iron deficiency anemia, unspecified: Secondary | ICD-10-CM | POA: Diagnosis not present

## 2017-07-26 DIAGNOSIS — N2581 Secondary hyperparathyroidism of renal origin: Secondary | ICD-10-CM | POA: Diagnosis not present

## 2017-07-27 ENCOUNTER — Ambulatory Visit
Admission: RE | Admit: 2017-07-27 | Discharge: 2017-07-27 | Disposition: A | Discharge status: Home / Self Care | Payer: Medicare Other | Source: Ambulatory Visit | Attending: Interventional Radiology | Admitting: Interventional Radiology

## 2017-07-27 ENCOUNTER — Encounter (HOSPITAL_COMMUNITY)
Admission: RE | Admit: 2017-07-27 | Discharge: 2017-07-27 | Disposition: A | Discharge status: Home / Self Care | Payer: Medicare Other | Source: Ambulatory Visit | Attending: Interventional Radiology | Admitting: Interventional Radiology

## 2017-07-27 DIAGNOSIS — C641 Malignant neoplasm of right kidney, except renal pelvis: Secondary | ICD-10-CM | POA: Diagnosis not present

## 2017-07-27 DIAGNOSIS — C649 Malignant neoplasm of unspecified kidney, except renal pelvis: Secondary | ICD-10-CM | POA: Diagnosis not present

## 2017-07-27 HISTORY — PX: IR RADIOLOGIST EVAL & MGMT: IMG5224

## 2017-07-27 LAB — GLUCOSE, CAPILLARY: GLUCOSE-CAPILLARY: 110 mg/dL — AB (ref 65–99)

## 2017-07-27 MED ORDER — FLUDEOXYGLUCOSE F - 18 (FDG) INJECTION
9.5500 | Freq: Once | INTRAVENOUS | Status: AC | PRN
  Administered 2017-07-27: 9.55 via INTRAVENOUS

## 2017-07-27 NOTE — Progress Notes (Signed)
Chief Complaint: Recurrent clear cell carcinoma  Referring Physician(s): Raynelle Bring  Supervising Physician: Aletta Edouard  History of Present Illness: BARRY FAIRCLOTH is a 67 y.o. male with a history of bilateral renal carcinoma who is known to [name] after prior past bilateral renal ablation procedures.   He underwent right nephrectomy in October, 2016 at [facility] due to tumor recurrence.  This was complicated by postoperative infection and ultimately an enlarging right retroperitoneal soft tissue nodule consistent with tumor recurrence.  This mass was sampled on 02/02/2016 with pathology confirming clear cell carcinoma.  He then underwent exploratory laparotomy with resection of the right retroperitoneal clear cell recurrence by Dr. Alinda Money on 04/07/2016.  Dr. Marcello Moores also assisted in performing resection of part of the right colon adherent to the tumor mass as well as wedge resection of a portion of the liver adherent to the tumor.  Pathology revealed a 5.5 cm clear cell carcinoma with negative margins at the level of the liver resection and colonic resection. Follow-up imaging in April and October demonstrated enlarging soft tissue nodules in the right retroperitoneum at the level of resection likely representing recurrent tumor.  Two separate adjacent areas measure approximately 1.3 x 2.4 cm and 1.6 x 2.0 cm.  A PET scan was performed on 02/28/2017 demonstrating that both of these nodular areas are hypermetabolic with SUV max of 11 and consistent with malignancy.  There was no evidence of any additional metastatic disease by PET scan throughout the rest of the body.   He was referred back to [name] by Dr. Alinda Money to determine if he was an ablation candidate to treat the areas of tumor recurrence.   He continues hemodialysis three times a week which is managed by Dr. Jimmy Footman.   He underwent thermal ablation of the recurrent renal cancer by [name] on  04/19/2017 and did well from the procedure. He had his follow up PET scan today for reassessment Wife is with him.   Past Medical History:  Diagnosis Date  . Anemia   . Arthritis    "back, neck" (07/28/2014)  . Blind    both eyes removed   . Bruises easily   . CAD, NATIVE VESSEL 01/08/2008      . Cellulitis late 1980's   "hospitalized; wrapped both legs; several times; no OR for this"  . Colon polyps   . Diabetic neuropathy ([place])   . Dialysis patient ([place])   . DM type 2 (diabetes mellitus, type 2) ([place])    no medications (07/28/2014) diet and excersie controlled not onmeds for 14-15 years   . Dysrhythmia    afib   . ESRD (end stage renal disease) on dialysis [facility])    [name]; Mon, Wed, Fri (07/28/2014)  . Fall    hx of fall and required left hip surgery   . Family history of breast cancer    mother  . Heart murmur    hx of  . History of blood product transfusion   . HYPERLIPIDEMIA-MIXED 01/08/2008  . HYPERTENSION 01/27/2009   BP low , hx of HTN, Currently on meds to raise BP  . Hypotension   . Kidney stones   . Low blood pressure   . OVERWEIGHT/OBESITY 01/08/2008   Lost 205 lbs through diet and exercise.    . Peripheral vascular disease ([place])    vein stripping  . Pneumonia 2000;s X 1  . Prostate cancer ([place])   . Prosthetic eye globe    both  eyes  . Renal cell carcinoma 2001 and 2003   "both kidneys"  . Renal insufficiency     Past Surgical History:  Procedure Laterality Date  . AV FISTULA PLACEMENT Left 02/2010   LFA  . AV FISTULA PLACEMENT Left 01/12/2016   Procedure: LEFT BRACHIOCEPHALIC ARTERIOVENOUS (AV) FISTULA CREATION;  Surgeon: Angelia Mould, MD;  Location: [place];  Service: Vascular;  Laterality: Left;  . CHOLECYSTECTOMY OPEN  1992  . COLONOSCOPY  06/14/2011   Procedure: COLONOSCOPY;  Surgeon: [name], MD;  Location: [facility];  Service: Endoscopy;  Laterality: N/A;  . COLONOSCOPY N/A 07/24/2012   Procedure: COLONOSCOPY;  Surgeon:  [name], MD;  Location: [facility];  Service: Endoscopy;  Laterality: N/A;  . COLONOSCOPY    . COLONOSCOPY N/A 11/04/2015   Procedure: COLONOSCOPY;  Surgeon: Manus Gunning, MD;  Location: [facility] ENDOSCOPY;  Service: Gastroenterology;  Laterality: N/A;  . COLONOSCOPY WITH PROPOFOL N/A 05/13/2014   Procedure: COLONOSCOPY WITH PROPOFOL;  Surgeon: [name], MD;  Location: [facility];  Service: Endoscopy;  Laterality: N/A;  . CORONARY ARTERY BYPASS GRAFT  09/29/2011   Procedure: CORONARY ARTERY BYPASS GRAFTING (CABG);  Surgeon: [name], MD;  Location: [place];  Service: Open Heart Surgery;  Laterality: N/A;  Coronary Artery Bypass Graft times two utilizing the left internal mammary artery and the right greater saphenous vein harvested endoscopically.  . CYSTOSCOPY WITH RETROGRADE PYELOGRAM, URETEROSCOPY AND STENT PLACEMENT Left 07/28/2014   Procedure: CYSTOSCOPY WITH RETROGRADE PYELOGRAM, URETERAL BALLOON DILITATION, URETEROSCOPY AND LEFT STENT PLACEMENT;  Surgeon: [name], MD;  Location: [facility];  Service: Urology;  Laterality: Left;  . ENUCLEATION  2003; 2006   bilateral; "diabetes; pain"  . FOOT AMPUTATION Right ~ 2002   right;  partial; "infection"  . FRACTURE SURGERY     hip fx  . HIP PINNING,CANNULATED Left 12/31/2013   Procedure: CANNULATED HIP PINNING;  Surgeon: Renette Butters, MD;  Location: [place];  Service: Orthopedics;  Laterality: Left;  Carm, FX Table, [employer]  . HOT HEMOSTASIS  06/14/2011   Procedure: HOT HEMOSTASIS (ARGON PLASMA COAGULATION/BICAP);  Surgeon: [name], MD;  Location[facility];  Service: Endoscopy;  Laterality: N/A;  . INSERTION OF DIALYSIS CATHETER Right 01/12/2016   Procedure: INSERTION OF DIALYSIS CATHETER;  Surgeon: Angelia Mould, MD;  Location: [place];  Service: Vascular;  Laterality: Right;  . INSERTION PROSTATE RADIATION SEED  03/2012  . IR GENERIC HISTORICAL  01/13/2016   IR RADIOLOGIST EVAL & MGMT 01/13/2016 Aletta Edouard, MD [facility] INTERV RAD  . IR RADIOLOGIST EVAL & MGMT  03/02/2017  . IR RADIOLOGIST EVAL & MGMT  05/18/2017  . LAPAROTOMY N/A 04/07/2016   Procedure: EXPLORATORY LAPAROTOMY AND RESECTION OF RETROPERITONEAL MASS;  Surgeon: Raynelle Bring, MD;  [facility];  Service: Urology;  Laterality: N/A;  . LEFT HEART CATHETERIZATION WITH CORONARY ANGIOGRAM N/A 09/27/2011   Procedure: LEFT HEART CATHETERIZATION WITH CORONARY ANGIOGRAM;  Surgeon: [name], MD;  Location: [facility] CATH LAB;  Service: Cardiovascular;  Laterality: N/A;  . LIGATION OF ARTERIOVENOUS  FISTULA Left 01/12/2016   Procedure: LIGATION OF LEFT RADIOCEPHALIC ARTERIOVENOUS  FISTULA;  Surgeon: Angelia Mould, MD;  Location: [place];  Service: Vascular;  Laterality: Left;  . LIGATION OF COMPETING BRANCHES OF ARTERIOVENOUS FISTULA Left 07/29/2014   Procedure: LIGATION OF COMPETING BRANCHES OF LEFT ARM ARTERIOVENOUS FISTULA;  Surgeon: [name], MD;  Location: [place];  Service: Vascular;  Laterality: Left;  .  NEPHRECTOMY Right 01/30/2015   done at [place]  . PARTIAL COLECTOMY Right 04/07/2016   Procedure: RIGHT COLECTOMY AND PARTIAL LIVER RESECTION;  Surgeon: Raynelle Bring, MD;  Location[facility];  Service: Urology;  Laterality: Right;  . RADIOFREQUENCY ABLATION N/A 04/19/2017   Procedure: CT MICROWAVE THERMAL ABLATION;  Surgeon: Aletta Edouard, MD;  Location: [facility];  Service: Anesthesiology;  Laterality: N/A;  . RADIOFREQUENCY ABLATION KIDNEY  2010-2012   "twice; one on each side; for cancer"  . REVISON OF ARTERIOVENOUS FISTULA Left 09/01/2015   Procedure: EXPLORATION LEFT LOWER ARM  RADIOCEPHALIC ARTERIOVENOUS FISTULA;  Surgeon: Conrad Harris, MD;  Location: [place];  Service: Vascular;  Laterality: Left;  [name] VARICOSE VEIN SURGERY  mid 1980's    BLE; "knees down; both legs; 2 separate times"    Allergies: Codeine and Tape  Medications: Prior to Admission medications   Medication Sig Start Date End Date Taking?  Authorizing Provider  aspirin EC 81 MG tablet Take 81 mg by mouth at bedtime.    [provider]  atorvastatin (LIPITOR) 10 MG tablet Take 10 mg by mouth daily.    [provider]  Methoxy PEG-Epoetin Beta (MIRCERA) 50 MCG/0.3ML SOSY Inject 225 mcg as directed every 14 (fourteen) days.     [provider]  midodrine (PROAMATINE) 10 MG tablet Take 1 tablet (10 mg total) by mouth 3 (three) times daily with meals. 11/05/15   Reyne Dumas, MD  multivitamin (RENA-VIT) TABS tablet Take 1 tablet by mouth daily.    [provider]  Probiotic Product (ALIGN) 4 MG CAPS Take 1 capsule by mouth daily.    [provider]  sevelamer carbonate (RENVELA) 800 MG tablet Take 2,400-4,800 mg by mouth See admin instructions. Take 6 tablets (4800 mg) by mouth 3 times daily with meals and 3 tablets (2400 mg) 2 times daily with snacks 06/26/14   [provider]     Family History  Problem Relation Age of Onset  . Cancer Mother 74       breast, spine and ovarian  . Heart disease Mother   . Hyperlipidemia Mother   . Hypertension Mother   . Varicose Veins Mother   . Emphysema Father   . Cancer Father 54       kidney, prostate  . Heart disease Father   . Hyperlipidemia Father   . Hypertension Father   . Cancer Sister        ovarian  . Cancer Brother        lung  . Heart disease Brother   . Hyperlipidemia Brother   . Hypertension Brother   . Heart attack Brother   . Peripheral vascular disease Brother   . Cancer Unknown   . Coronary artery disease Unknown   . Kidney disease Unknown   . Breast cancer Unknown   . Ovarian cancer Unknown   . Lung cancer Unknown   . Diabetes Unknown   . Malignant hyperthermia Neg Hx     Social History   Socioeconomic History  . Marital status: Married    Spouse name: Not on file  . Number of children: Not on file  . Years of education: Not on file  . Highest education level: Not on file  Occupational History  .  Not on file  Social Needs  . Financial resource strain: Not on file  . Food insecurity:    Worry: Not on file    Inability: Not on file  . Transportation needs:  Medical: Not on file    Non-medical: Not on file  Tobacco Use  . Smoking status: Never Smoker  . Smokeless tobacco: Never Used  Substance and Sexual Activity  . Alcohol use: No    Alcohol/week: 0.0 oz  . Drug use: No  . Sexual activity: Never  Lifestyle  . Physical activity:    Days per week: Not on file    Minutes per session: Not on file  . Stress: Not on file  Relationships  . Social connections:    Talks on phone: Not on file    Gets together: Not on file    Attends religious service: Not on file    Active member of club or organization: Not on file    Attends meetings of clubs or organizations: Not on file    Relationship status: Not on file  Other Topics Concern  . Not on file  Social History Narrative  . Not on file    Review of Systems: A 12 point ROS discussed and pertinent positives are indicated in the HPI above.  All other systems are negative. Review of Systems  Vital Signs: BP (!) 103/53   Pulse 60   Temp (!) 97.3 F (36.3 C) (Oral)   Resp 17   Ht 6\' 4"  (1.93 m)   Wt 198 lb (89.8 kg)   SpO2 95%   BMI 24.10 kg/m   Physical Exam  Constitutional: He is oriented to person, place, and time. He appears well-developed.  Abdominal: Soft. He exhibits no distension. There is no tenderness.  Neurological: He is alert and oriented to person, place, and time.  Skin: Skin is warm.  Psychiatric: He has a normal mood and affect.  Puncture site is completed healed and non-visible.   Imaging: No results found.  Labs:  CBC: Recent Labs    04/19/17 0701 04/20/17 0410  WBC 4.8 3.3*  HGB 10.7* 9.8*  HCT 33.1* 29.8*  PLT 104* 89*    COAGS: Recent Labs    04/19/17 0701  INR 1.06    BMP: Recent Labs    04/19/17 0701  NA 142  K 3.8  CL 102  CO2 27  GLUCOSE 124*  BUN 95*    CALCIUM 9.2  CREATININE 7.37*  GFRNONAA 7*  GFRAA 8*   Assessment: Recurrent clear cell cancer S/P thermal ablation by [name] on 04/19/2017. Patient seen by [name] today. PET scan today shows no hypermetabolic activity in the ablation/treatment area. Will plan CT abdomen with contrast in 6 months for continued follow up. If there is concern for recurrence, repeat PET can then be ordered.   Electronically Signed: Ascencion Dike PA-C 07/27/2017, 1:24 PM   Please refer to Dr. Margaretmary Dys attestation of this note for management and plan.

## 2017-07-28 DIAGNOSIS — K7689 Other specified diseases of liver: Secondary | ICD-10-CM | POA: Diagnosis not present

## 2017-07-28 DIAGNOSIS — E119 Type 2 diabetes mellitus without complications: Secondary | ICD-10-CM | POA: Diagnosis not present

## 2017-07-28 DIAGNOSIS — D509 Iron deficiency anemia, unspecified: Secondary | ICD-10-CM | POA: Diagnosis not present

## 2017-07-28 DIAGNOSIS — N2581 Secondary hyperparathyroidism of renal origin: Secondary | ICD-10-CM | POA: Diagnosis not present

## 2017-07-28 DIAGNOSIS — D631 Anemia in chronic kidney disease: Secondary | ICD-10-CM | POA: Diagnosis not present

## 2017-07-28 DIAGNOSIS — N186 End stage renal disease: Secondary | ICD-10-CM | POA: Diagnosis not present

## 2017-07-31 DIAGNOSIS — D509 Iron deficiency anemia, unspecified: Secondary | ICD-10-CM | POA: Diagnosis not present

## 2017-07-31 DIAGNOSIS — D631 Anemia in chronic kidney disease: Secondary | ICD-10-CM | POA: Diagnosis not present

## 2017-07-31 DIAGNOSIS — N2581 Secondary hyperparathyroidism of renal origin: Secondary | ICD-10-CM | POA: Diagnosis not present

## 2017-07-31 DIAGNOSIS — K7689 Other specified diseases of liver: Secondary | ICD-10-CM | POA: Diagnosis not present

## 2017-07-31 DIAGNOSIS — E119 Type 2 diabetes mellitus without complications: Secondary | ICD-10-CM | POA: Diagnosis not present

## 2017-07-31 DIAGNOSIS — N186 End stage renal disease: Secondary | ICD-10-CM | POA: Diagnosis not present

## 2017-08-02 DIAGNOSIS — D509 Iron deficiency anemia, unspecified: Secondary | ICD-10-CM | POA: Diagnosis not present

## 2017-08-02 DIAGNOSIS — E1129 Type 2 diabetes mellitus with other diabetic kidney complication: Secondary | ICD-10-CM | POA: Diagnosis not present

## 2017-08-02 DIAGNOSIS — N2581 Secondary hyperparathyroidism of renal origin: Secondary | ICD-10-CM | POA: Diagnosis not present

## 2017-08-02 DIAGNOSIS — E119 Type 2 diabetes mellitus without complications: Secondary | ICD-10-CM | POA: Diagnosis not present

## 2017-08-02 DIAGNOSIS — N186 End stage renal disease: Secondary | ICD-10-CM | POA: Diagnosis not present

## 2017-08-02 DIAGNOSIS — D631 Anemia in chronic kidney disease: Secondary | ICD-10-CM | POA: Diagnosis not present

## 2017-08-02 DIAGNOSIS — Z992 Dependence on renal dialysis: Secondary | ICD-10-CM | POA: Diagnosis not present

## 2017-08-02 DIAGNOSIS — Z23 Encounter for immunization: Secondary | ICD-10-CM | POA: Diagnosis not present

## 2017-08-02 DIAGNOSIS — K7689 Other specified diseases of liver: Secondary | ICD-10-CM | POA: Diagnosis not present

## 2017-08-04 DIAGNOSIS — E119 Type 2 diabetes mellitus without complications: Secondary | ICD-10-CM | POA: Diagnosis not present

## 2017-08-04 DIAGNOSIS — D509 Iron deficiency anemia, unspecified: Secondary | ICD-10-CM | POA: Diagnosis not present

## 2017-08-04 DIAGNOSIS — D631 Anemia in chronic kidney disease: Secondary | ICD-10-CM | POA: Diagnosis not present

## 2017-08-04 DIAGNOSIS — N186 End stage renal disease: Secondary | ICD-10-CM | POA: Diagnosis not present

## 2017-08-04 DIAGNOSIS — N2581 Secondary hyperparathyroidism of renal origin: Secondary | ICD-10-CM | POA: Diagnosis not present

## 2017-08-04 DIAGNOSIS — K7689 Other specified diseases of liver: Secondary | ICD-10-CM | POA: Diagnosis not present

## 2017-08-07 DIAGNOSIS — K7689 Other specified diseases of liver: Secondary | ICD-10-CM | POA: Diagnosis not present

## 2017-08-07 DIAGNOSIS — D509 Iron deficiency anemia, unspecified: Secondary | ICD-10-CM | POA: Diagnosis not present

## 2017-08-07 DIAGNOSIS — N2581 Secondary hyperparathyroidism of renal origin: Secondary | ICD-10-CM | POA: Diagnosis not present

## 2017-08-07 DIAGNOSIS — N186 End stage renal disease: Secondary | ICD-10-CM | POA: Diagnosis not present

## 2017-08-07 DIAGNOSIS — E119 Type 2 diabetes mellitus without complications: Secondary | ICD-10-CM | POA: Diagnosis not present

## 2017-08-07 DIAGNOSIS — D631 Anemia in chronic kidney disease: Secondary | ICD-10-CM | POA: Diagnosis not present

## 2017-08-08 ENCOUNTER — Ambulatory Visit (INDEPENDENT_AMBULATORY_CARE_PROVIDER_SITE_OTHER): Payer: Medicare Other | Admitting: Podiatry

## 2017-08-08 ENCOUNTER — Encounter: Payer: Self-pay | Admitting: Podiatry

## 2017-08-08 DIAGNOSIS — B351 Tinea unguium: Secondary | ICD-10-CM | POA: Diagnosis not present

## 2017-08-08 DIAGNOSIS — Z89431 Acquired absence of right foot: Secondary | ICD-10-CM

## 2017-08-08 DIAGNOSIS — E1159 Type 2 diabetes mellitus with other circulatory complications: Secondary | ICD-10-CM

## 2017-08-08 DIAGNOSIS — IMO0002 Reserved for concepts with insufficient information to code with codable children: Secondary | ICD-10-CM

## 2017-08-08 DIAGNOSIS — M79675 Pain in left toe(s): Secondary | ICD-10-CM | POA: Diagnosis not present

## 2017-08-08 NOTE — Progress Notes (Signed)
This patient presents to the office today with thick disfigured discolored toenails on his left foot.  Patient has a history of a transmetatarsal amputation right foot.  This patient is also blind.  Patient states the nails on the toes on his left foot are painful walking and wearing his shoes.  He is unable to self treat.  He presents to the office for preventative foot care services.   General Appearance  Alert, conversant and in no acute stress.  Vascular  Dorsalis pedis and posterior pulses are weakly  palpable  bilaterally.  Capillary return is within normal limits  bilaterally. Temperature is within normal limits  Bilaterally.  Neurologic  Senn-Weinstein monofilament wire test deferred.  Nails Thick disfigured discolored nails with subungual debris  from hallux to fifth toes  Left foot.. No evidence of bacterial infection or drainage bilaterally.  Orthopedic  No limitations of motion of motion feet bilaterally.  No crepitus or effusions noted.  No bony pathology or digital deformities noted. TMA right foot.  Skin  normotropic skin with no porokeratosis noted bilaterally.  No signs of infections or ulcers noted.     Onychomycosis  Left foot   ROV  Debridement of long painful nails left foot x 5.  RTC 3 months.  Patient was told to get Diabetic shoes due to toe amputation right foot.   Gardiner Barefoot DPM

## 2017-08-09 DIAGNOSIS — E119 Type 2 diabetes mellitus without complications: Secondary | ICD-10-CM | POA: Diagnosis not present

## 2017-08-09 DIAGNOSIS — D509 Iron deficiency anemia, unspecified: Secondary | ICD-10-CM | POA: Diagnosis not present

## 2017-08-09 DIAGNOSIS — K7689 Other specified diseases of liver: Secondary | ICD-10-CM | POA: Diagnosis not present

## 2017-08-09 DIAGNOSIS — D631 Anemia in chronic kidney disease: Secondary | ICD-10-CM | POA: Diagnosis not present

## 2017-08-09 DIAGNOSIS — N186 End stage renal disease: Secondary | ICD-10-CM | POA: Diagnosis not present

## 2017-08-09 DIAGNOSIS — N2581 Secondary hyperparathyroidism of renal origin: Secondary | ICD-10-CM | POA: Diagnosis not present

## 2017-08-11 DIAGNOSIS — E119 Type 2 diabetes mellitus without complications: Secondary | ICD-10-CM | POA: Diagnosis not present

## 2017-08-11 DIAGNOSIS — D631 Anemia in chronic kidney disease: Secondary | ICD-10-CM | POA: Diagnosis not present

## 2017-08-11 DIAGNOSIS — N2581 Secondary hyperparathyroidism of renal origin: Secondary | ICD-10-CM | POA: Diagnosis not present

## 2017-08-11 DIAGNOSIS — K7689 Other specified diseases of liver: Secondary | ICD-10-CM | POA: Diagnosis not present

## 2017-08-11 DIAGNOSIS — D509 Iron deficiency anemia, unspecified: Secondary | ICD-10-CM | POA: Diagnosis not present

## 2017-08-11 DIAGNOSIS — N186 End stage renal disease: Secondary | ICD-10-CM | POA: Diagnosis not present

## 2017-08-14 DIAGNOSIS — D509 Iron deficiency anemia, unspecified: Secondary | ICD-10-CM | POA: Diagnosis not present

## 2017-08-14 DIAGNOSIS — N2581 Secondary hyperparathyroidism of renal origin: Secondary | ICD-10-CM | POA: Diagnosis not present

## 2017-08-14 DIAGNOSIS — D631 Anemia in chronic kidney disease: Secondary | ICD-10-CM | POA: Diagnosis not present

## 2017-08-14 DIAGNOSIS — E119 Type 2 diabetes mellitus without complications: Secondary | ICD-10-CM | POA: Diagnosis not present

## 2017-08-14 DIAGNOSIS — K7689 Other specified diseases of liver: Secondary | ICD-10-CM | POA: Diagnosis not present

## 2017-08-14 DIAGNOSIS — N186 End stage renal disease: Secondary | ICD-10-CM | POA: Diagnosis not present

## 2017-08-16 DIAGNOSIS — K7689 Other specified diseases of liver: Secondary | ICD-10-CM | POA: Diagnosis not present

## 2017-08-16 DIAGNOSIS — D509 Iron deficiency anemia, unspecified: Secondary | ICD-10-CM | POA: Diagnosis not present

## 2017-08-16 DIAGNOSIS — N2581 Secondary hyperparathyroidism of renal origin: Secondary | ICD-10-CM | POA: Diagnosis not present

## 2017-08-16 DIAGNOSIS — D631 Anemia in chronic kidney disease: Secondary | ICD-10-CM | POA: Diagnosis not present

## 2017-08-16 DIAGNOSIS — N186 End stage renal disease: Secondary | ICD-10-CM | POA: Diagnosis not present

## 2017-08-16 DIAGNOSIS — E119 Type 2 diabetes mellitus without complications: Secondary | ICD-10-CM | POA: Diagnosis not present

## 2017-08-18 DIAGNOSIS — N2581 Secondary hyperparathyroidism of renal origin: Secondary | ICD-10-CM | POA: Diagnosis not present

## 2017-08-18 DIAGNOSIS — K7689 Other specified diseases of liver: Secondary | ICD-10-CM | POA: Diagnosis not present

## 2017-08-18 DIAGNOSIS — E119 Type 2 diabetes mellitus without complications: Secondary | ICD-10-CM | POA: Diagnosis not present

## 2017-08-18 DIAGNOSIS — N186 End stage renal disease: Secondary | ICD-10-CM | POA: Diagnosis not present

## 2017-08-18 DIAGNOSIS — D631 Anemia in chronic kidney disease: Secondary | ICD-10-CM | POA: Diagnosis not present

## 2017-08-18 DIAGNOSIS — D509 Iron deficiency anemia, unspecified: Secondary | ICD-10-CM | POA: Diagnosis not present

## 2017-08-21 DIAGNOSIS — E119 Type 2 diabetes mellitus without complications: Secondary | ICD-10-CM | POA: Diagnosis not present

## 2017-08-21 DIAGNOSIS — K7689 Other specified diseases of liver: Secondary | ICD-10-CM | POA: Diagnosis not present

## 2017-08-21 DIAGNOSIS — D509 Iron deficiency anemia, unspecified: Secondary | ICD-10-CM | POA: Diagnosis not present

## 2017-08-21 DIAGNOSIS — D631 Anemia in chronic kidney disease: Secondary | ICD-10-CM | POA: Diagnosis not present

## 2017-08-21 DIAGNOSIS — N186 End stage renal disease: Secondary | ICD-10-CM | POA: Diagnosis not present

## 2017-08-21 DIAGNOSIS — N2581 Secondary hyperparathyroidism of renal origin: Secondary | ICD-10-CM | POA: Diagnosis not present

## 2017-08-23 DIAGNOSIS — E119 Type 2 diabetes mellitus without complications: Secondary | ICD-10-CM | POA: Diagnosis not present

## 2017-08-23 DIAGNOSIS — D631 Anemia in chronic kidney disease: Secondary | ICD-10-CM | POA: Diagnosis not present

## 2017-08-23 DIAGNOSIS — N2581 Secondary hyperparathyroidism of renal origin: Secondary | ICD-10-CM | POA: Diagnosis not present

## 2017-08-23 DIAGNOSIS — N186 End stage renal disease: Secondary | ICD-10-CM | POA: Diagnosis not present

## 2017-08-23 DIAGNOSIS — D509 Iron deficiency anemia, unspecified: Secondary | ICD-10-CM | POA: Diagnosis not present

## 2017-08-23 DIAGNOSIS — K7689 Other specified diseases of liver: Secondary | ICD-10-CM | POA: Diagnosis not present

## 2017-08-25 DIAGNOSIS — E119 Type 2 diabetes mellitus without complications: Secondary | ICD-10-CM | POA: Diagnosis not present

## 2017-08-25 DIAGNOSIS — N2581 Secondary hyperparathyroidism of renal origin: Secondary | ICD-10-CM | POA: Diagnosis not present

## 2017-08-25 DIAGNOSIS — K7689 Other specified diseases of liver: Secondary | ICD-10-CM | POA: Diagnosis not present

## 2017-08-25 DIAGNOSIS — D631 Anemia in chronic kidney disease: Secondary | ICD-10-CM | POA: Diagnosis not present

## 2017-08-25 DIAGNOSIS — N186 End stage renal disease: Secondary | ICD-10-CM | POA: Diagnosis not present

## 2017-08-25 DIAGNOSIS — D509 Iron deficiency anemia, unspecified: Secondary | ICD-10-CM | POA: Diagnosis not present

## 2017-08-28 DIAGNOSIS — D509 Iron deficiency anemia, unspecified: Secondary | ICD-10-CM | POA: Diagnosis not present

## 2017-08-28 DIAGNOSIS — E119 Type 2 diabetes mellitus without complications: Secondary | ICD-10-CM | POA: Diagnosis not present

## 2017-08-28 DIAGNOSIS — N2581 Secondary hyperparathyroidism of renal origin: Secondary | ICD-10-CM | POA: Diagnosis not present

## 2017-08-28 DIAGNOSIS — D631 Anemia in chronic kidney disease: Secondary | ICD-10-CM | POA: Diagnosis not present

## 2017-08-28 DIAGNOSIS — K7689 Other specified diseases of liver: Secondary | ICD-10-CM | POA: Diagnosis not present

## 2017-08-28 DIAGNOSIS — N186 End stage renal disease: Secondary | ICD-10-CM | POA: Diagnosis not present

## 2017-08-30 DIAGNOSIS — D631 Anemia in chronic kidney disease: Secondary | ICD-10-CM | POA: Diagnosis not present

## 2017-08-30 DIAGNOSIS — E119 Type 2 diabetes mellitus without complications: Secondary | ICD-10-CM | POA: Diagnosis not present

## 2017-08-30 DIAGNOSIS — N2581 Secondary hyperparathyroidism of renal origin: Secondary | ICD-10-CM | POA: Diagnosis not present

## 2017-08-30 DIAGNOSIS — N186 End stage renal disease: Secondary | ICD-10-CM | POA: Diagnosis not present

## 2017-08-30 DIAGNOSIS — D509 Iron deficiency anemia, unspecified: Secondary | ICD-10-CM | POA: Diagnosis not present

## 2017-08-30 DIAGNOSIS — K7689 Other specified diseases of liver: Secondary | ICD-10-CM | POA: Diagnosis not present

## 2017-09-01 DIAGNOSIS — N2581 Secondary hyperparathyroidism of renal origin: Secondary | ICD-10-CM | POA: Diagnosis not present

## 2017-09-01 DIAGNOSIS — D631 Anemia in chronic kidney disease: Secondary | ICD-10-CM | POA: Diagnosis not present

## 2017-09-01 DIAGNOSIS — K7689 Other specified diseases of liver: Secondary | ICD-10-CM | POA: Diagnosis not present

## 2017-09-01 DIAGNOSIS — N186 End stage renal disease: Secondary | ICD-10-CM | POA: Diagnosis not present

## 2017-09-01 DIAGNOSIS — E119 Type 2 diabetes mellitus without complications: Secondary | ICD-10-CM | POA: Diagnosis not present

## 2017-09-01 DIAGNOSIS — D509 Iron deficiency anemia, unspecified: Secondary | ICD-10-CM | POA: Diagnosis not present

## 2017-09-02 DIAGNOSIS — E1129 Type 2 diabetes mellitus with other diabetic kidney complication: Secondary | ICD-10-CM | POA: Diagnosis not present

## 2017-09-02 DIAGNOSIS — N186 End stage renal disease: Secondary | ICD-10-CM | POA: Diagnosis not present

## 2017-09-02 DIAGNOSIS — Z992 Dependence on renal dialysis: Secondary | ICD-10-CM | POA: Diagnosis not present

## 2017-09-04 DIAGNOSIS — E119 Type 2 diabetes mellitus without complications: Secondary | ICD-10-CM | POA: Diagnosis not present

## 2017-09-04 DIAGNOSIS — K7689 Other specified diseases of liver: Secondary | ICD-10-CM | POA: Diagnosis not present

## 2017-09-04 DIAGNOSIS — D631 Anemia in chronic kidney disease: Secondary | ICD-10-CM | POA: Diagnosis not present

## 2017-09-04 DIAGNOSIS — D509 Iron deficiency anemia, unspecified: Secondary | ICD-10-CM | POA: Diagnosis not present

## 2017-09-04 DIAGNOSIS — N2581 Secondary hyperparathyroidism of renal origin: Secondary | ICD-10-CM | POA: Diagnosis not present

## 2017-09-04 DIAGNOSIS — N186 End stage renal disease: Secondary | ICD-10-CM | POA: Diagnosis not present

## 2017-09-06 DIAGNOSIS — N186 End stage renal disease: Secondary | ICD-10-CM | POA: Diagnosis not present

## 2017-09-06 DIAGNOSIS — D631 Anemia in chronic kidney disease: Secondary | ICD-10-CM | POA: Diagnosis not present

## 2017-09-06 DIAGNOSIS — K7689 Other specified diseases of liver: Secondary | ICD-10-CM | POA: Diagnosis not present

## 2017-09-06 DIAGNOSIS — N2581 Secondary hyperparathyroidism of renal origin: Secondary | ICD-10-CM | POA: Diagnosis not present

## 2017-09-06 DIAGNOSIS — D509 Iron deficiency anemia, unspecified: Secondary | ICD-10-CM | POA: Diagnosis not present

## 2017-09-06 DIAGNOSIS — E119 Type 2 diabetes mellitus without complications: Secondary | ICD-10-CM | POA: Diagnosis not present

## 2017-09-08 DIAGNOSIS — N2581 Secondary hyperparathyroidism of renal origin: Secondary | ICD-10-CM | POA: Diagnosis not present

## 2017-09-08 DIAGNOSIS — D509 Iron deficiency anemia, unspecified: Secondary | ICD-10-CM | POA: Diagnosis not present

## 2017-09-08 DIAGNOSIS — K7689 Other specified diseases of liver: Secondary | ICD-10-CM | POA: Diagnosis not present

## 2017-09-08 DIAGNOSIS — E119 Type 2 diabetes mellitus without complications: Secondary | ICD-10-CM | POA: Diagnosis not present

## 2017-09-08 DIAGNOSIS — D631 Anemia in chronic kidney disease: Secondary | ICD-10-CM | POA: Diagnosis not present

## 2017-09-08 DIAGNOSIS — N186 End stage renal disease: Secondary | ICD-10-CM | POA: Diagnosis not present

## 2017-09-11 DIAGNOSIS — N2581 Secondary hyperparathyroidism of renal origin: Secondary | ICD-10-CM | POA: Diagnosis not present

## 2017-09-11 DIAGNOSIS — E119 Type 2 diabetes mellitus without complications: Secondary | ICD-10-CM | POA: Diagnosis not present

## 2017-09-11 DIAGNOSIS — D631 Anemia in chronic kidney disease: Secondary | ICD-10-CM | POA: Diagnosis not present

## 2017-09-11 DIAGNOSIS — K7689 Other specified diseases of liver: Secondary | ICD-10-CM | POA: Diagnosis not present

## 2017-09-11 DIAGNOSIS — N186 End stage renal disease: Secondary | ICD-10-CM | POA: Diagnosis not present

## 2017-09-11 DIAGNOSIS — D509 Iron deficiency anemia, unspecified: Secondary | ICD-10-CM | POA: Diagnosis not present

## 2017-09-13 DIAGNOSIS — D631 Anemia in chronic kidney disease: Secondary | ICD-10-CM | POA: Diagnosis not present

## 2017-09-13 DIAGNOSIS — N186 End stage renal disease: Secondary | ICD-10-CM | POA: Diagnosis not present

## 2017-09-13 DIAGNOSIS — E119 Type 2 diabetes mellitus without complications: Secondary | ICD-10-CM | POA: Diagnosis not present

## 2017-09-13 DIAGNOSIS — N2581 Secondary hyperparathyroidism of renal origin: Secondary | ICD-10-CM | POA: Diagnosis not present

## 2017-09-13 DIAGNOSIS — D509 Iron deficiency anemia, unspecified: Secondary | ICD-10-CM | POA: Diagnosis not present

## 2017-09-13 DIAGNOSIS — K7689 Other specified diseases of liver: Secondary | ICD-10-CM | POA: Diagnosis not present

## 2017-09-15 DIAGNOSIS — N186 End stage renal disease: Secondary | ICD-10-CM | POA: Diagnosis not present

## 2017-09-15 DIAGNOSIS — K7689 Other specified diseases of liver: Secondary | ICD-10-CM | POA: Diagnosis not present

## 2017-09-15 DIAGNOSIS — D509 Iron deficiency anemia, unspecified: Secondary | ICD-10-CM | POA: Diagnosis not present

## 2017-09-15 DIAGNOSIS — N2581 Secondary hyperparathyroidism of renal origin: Secondary | ICD-10-CM | POA: Diagnosis not present

## 2017-09-15 DIAGNOSIS — D631 Anemia in chronic kidney disease: Secondary | ICD-10-CM | POA: Diagnosis not present

## 2017-09-15 DIAGNOSIS — E119 Type 2 diabetes mellitus without complications: Secondary | ICD-10-CM | POA: Diagnosis not present

## 2017-09-18 DIAGNOSIS — N186 End stage renal disease: Secondary | ICD-10-CM | POA: Diagnosis not present

## 2017-09-18 DIAGNOSIS — D631 Anemia in chronic kidney disease: Secondary | ICD-10-CM | POA: Diagnosis not present

## 2017-09-18 DIAGNOSIS — K7689 Other specified diseases of liver: Secondary | ICD-10-CM | POA: Diagnosis not present

## 2017-09-18 DIAGNOSIS — D509 Iron deficiency anemia, unspecified: Secondary | ICD-10-CM | POA: Diagnosis not present

## 2017-09-18 DIAGNOSIS — N2581 Secondary hyperparathyroidism of renal origin: Secondary | ICD-10-CM | POA: Diagnosis not present

## 2017-09-18 DIAGNOSIS — E119 Type 2 diabetes mellitus without complications: Secondary | ICD-10-CM | POA: Diagnosis not present

## 2017-09-20 DIAGNOSIS — D631 Anemia in chronic kidney disease: Secondary | ICD-10-CM | POA: Diagnosis not present

## 2017-09-20 DIAGNOSIS — K7689 Other specified diseases of liver: Secondary | ICD-10-CM | POA: Diagnosis not present

## 2017-09-20 DIAGNOSIS — N2581 Secondary hyperparathyroidism of renal origin: Secondary | ICD-10-CM | POA: Diagnosis not present

## 2017-09-20 DIAGNOSIS — E119 Type 2 diabetes mellitus without complications: Secondary | ICD-10-CM | POA: Diagnosis not present

## 2017-09-20 DIAGNOSIS — D509 Iron deficiency anemia, unspecified: Secondary | ICD-10-CM | POA: Diagnosis not present

## 2017-09-20 DIAGNOSIS — N186 End stage renal disease: Secondary | ICD-10-CM | POA: Diagnosis not present

## 2017-09-22 DIAGNOSIS — D631 Anemia in chronic kidney disease: Secondary | ICD-10-CM | POA: Diagnosis not present

## 2017-09-22 DIAGNOSIS — N186 End stage renal disease: Secondary | ICD-10-CM | POA: Diagnosis not present

## 2017-09-22 DIAGNOSIS — K7689 Other specified diseases of liver: Secondary | ICD-10-CM | POA: Diagnosis not present

## 2017-09-22 DIAGNOSIS — E119 Type 2 diabetes mellitus without complications: Secondary | ICD-10-CM | POA: Diagnosis not present

## 2017-09-22 DIAGNOSIS — D509 Iron deficiency anemia, unspecified: Secondary | ICD-10-CM | POA: Diagnosis not present

## 2017-09-22 DIAGNOSIS — N2581 Secondary hyperparathyroidism of renal origin: Secondary | ICD-10-CM | POA: Diagnosis not present

## 2017-09-25 DIAGNOSIS — N2581 Secondary hyperparathyroidism of renal origin: Secondary | ICD-10-CM | POA: Diagnosis not present

## 2017-09-25 DIAGNOSIS — N186 End stage renal disease: Secondary | ICD-10-CM | POA: Diagnosis not present

## 2017-09-25 DIAGNOSIS — K7689 Other specified diseases of liver: Secondary | ICD-10-CM | POA: Diagnosis not present

## 2017-09-25 DIAGNOSIS — E119 Type 2 diabetes mellitus without complications: Secondary | ICD-10-CM | POA: Diagnosis not present

## 2017-09-25 DIAGNOSIS — D631 Anemia in chronic kidney disease: Secondary | ICD-10-CM | POA: Diagnosis not present

## 2017-09-25 DIAGNOSIS — D509 Iron deficiency anemia, unspecified: Secondary | ICD-10-CM | POA: Diagnosis not present

## 2017-09-27 DIAGNOSIS — N2581 Secondary hyperparathyroidism of renal origin: Secondary | ICD-10-CM | POA: Diagnosis not present

## 2017-09-27 DIAGNOSIS — N186 End stage renal disease: Secondary | ICD-10-CM | POA: Diagnosis not present

## 2017-09-27 DIAGNOSIS — D631 Anemia in chronic kidney disease: Secondary | ICD-10-CM | POA: Diagnosis not present

## 2017-09-27 DIAGNOSIS — D509 Iron deficiency anemia, unspecified: Secondary | ICD-10-CM | POA: Diagnosis not present

## 2017-09-27 DIAGNOSIS — E119 Type 2 diabetes mellitus without complications: Secondary | ICD-10-CM | POA: Diagnosis not present

## 2017-09-27 DIAGNOSIS — K7689 Other specified diseases of liver: Secondary | ICD-10-CM | POA: Diagnosis not present

## 2017-09-28 DIAGNOSIS — Z992 Dependence on renal dialysis: Secondary | ICD-10-CM | POA: Diagnosis not present

## 2017-09-28 DIAGNOSIS — T82858A Stenosis of vascular prosthetic devices, implants and grafts, initial encounter: Secondary | ICD-10-CM | POA: Diagnosis not present

## 2017-09-28 DIAGNOSIS — N186 End stage renal disease: Secondary | ICD-10-CM | POA: Diagnosis not present

## 2017-09-28 DIAGNOSIS — I871 Compression of vein: Secondary | ICD-10-CM | POA: Diagnosis not present

## 2017-09-29 DIAGNOSIS — N186 End stage renal disease: Secondary | ICD-10-CM | POA: Diagnosis not present

## 2017-09-29 DIAGNOSIS — D509 Iron deficiency anemia, unspecified: Secondary | ICD-10-CM | POA: Diagnosis not present

## 2017-09-29 DIAGNOSIS — K7689 Other specified diseases of liver: Secondary | ICD-10-CM | POA: Diagnosis not present

## 2017-09-29 DIAGNOSIS — E119 Type 2 diabetes mellitus without complications: Secondary | ICD-10-CM | POA: Diagnosis not present

## 2017-09-29 DIAGNOSIS — N2581 Secondary hyperparathyroidism of renal origin: Secondary | ICD-10-CM | POA: Diagnosis not present

## 2017-09-29 DIAGNOSIS — D631 Anemia in chronic kidney disease: Secondary | ICD-10-CM | POA: Diagnosis not present

## 2017-10-02 DIAGNOSIS — K7689 Other specified diseases of liver: Secondary | ICD-10-CM | POA: Diagnosis not present

## 2017-10-02 DIAGNOSIS — Z992 Dependence on renal dialysis: Secondary | ICD-10-CM | POA: Diagnosis not present

## 2017-10-02 DIAGNOSIS — N186 End stage renal disease: Secondary | ICD-10-CM | POA: Diagnosis not present

## 2017-10-02 DIAGNOSIS — E1129 Type 2 diabetes mellitus with other diabetic kidney complication: Secondary | ICD-10-CM | POA: Diagnosis not present

## 2017-10-02 DIAGNOSIS — E119 Type 2 diabetes mellitus without complications: Secondary | ICD-10-CM | POA: Diagnosis not present

## 2017-10-02 DIAGNOSIS — D631 Anemia in chronic kidney disease: Secondary | ICD-10-CM | POA: Diagnosis not present

## 2017-10-02 DIAGNOSIS — N2581 Secondary hyperparathyroidism of renal origin: Secondary | ICD-10-CM | POA: Diagnosis not present

## 2017-10-02 DIAGNOSIS — D509 Iron deficiency anemia, unspecified: Secondary | ICD-10-CM | POA: Diagnosis not present

## 2017-10-04 DIAGNOSIS — K7689 Other specified diseases of liver: Secondary | ICD-10-CM | POA: Diagnosis not present

## 2017-10-04 DIAGNOSIS — D509 Iron deficiency anemia, unspecified: Secondary | ICD-10-CM | POA: Diagnosis not present

## 2017-10-04 DIAGNOSIS — N2581 Secondary hyperparathyroidism of renal origin: Secondary | ICD-10-CM | POA: Diagnosis not present

## 2017-10-04 DIAGNOSIS — E119 Type 2 diabetes mellitus without complications: Secondary | ICD-10-CM | POA: Diagnosis not present

## 2017-10-04 DIAGNOSIS — D631 Anemia in chronic kidney disease: Secondary | ICD-10-CM | POA: Diagnosis not present

## 2017-10-04 DIAGNOSIS — N186 End stage renal disease: Secondary | ICD-10-CM | POA: Diagnosis not present

## 2017-10-06 DIAGNOSIS — D509 Iron deficiency anemia, unspecified: Secondary | ICD-10-CM | POA: Diagnosis not present

## 2017-10-06 DIAGNOSIS — N186 End stage renal disease: Secondary | ICD-10-CM | POA: Diagnosis not present

## 2017-10-06 DIAGNOSIS — K7689 Other specified diseases of liver: Secondary | ICD-10-CM | POA: Diagnosis not present

## 2017-10-06 DIAGNOSIS — E119 Type 2 diabetes mellitus without complications: Secondary | ICD-10-CM | POA: Diagnosis not present

## 2017-10-06 DIAGNOSIS — D631 Anemia in chronic kidney disease: Secondary | ICD-10-CM | POA: Diagnosis not present

## 2017-10-06 DIAGNOSIS — N2581 Secondary hyperparathyroidism of renal origin: Secondary | ICD-10-CM | POA: Diagnosis not present

## 2017-10-09 DIAGNOSIS — E119 Type 2 diabetes mellitus without complications: Secondary | ICD-10-CM | POA: Diagnosis not present

## 2017-10-09 DIAGNOSIS — K7689 Other specified diseases of liver: Secondary | ICD-10-CM | POA: Diagnosis not present

## 2017-10-09 DIAGNOSIS — D631 Anemia in chronic kidney disease: Secondary | ICD-10-CM | POA: Diagnosis not present

## 2017-10-09 DIAGNOSIS — D509 Iron deficiency anemia, unspecified: Secondary | ICD-10-CM | POA: Diagnosis not present

## 2017-10-09 DIAGNOSIS — N186 End stage renal disease: Secondary | ICD-10-CM | POA: Diagnosis not present

## 2017-10-09 DIAGNOSIS — N2581 Secondary hyperparathyroidism of renal origin: Secondary | ICD-10-CM | POA: Diagnosis not present

## 2017-10-11 DIAGNOSIS — E1129 Type 2 diabetes mellitus with other diabetic kidney complication: Secondary | ICD-10-CM | POA: Diagnosis not present

## 2017-10-11 DIAGNOSIS — N2581 Secondary hyperparathyroidism of renal origin: Secondary | ICD-10-CM | POA: Diagnosis not present

## 2017-10-11 DIAGNOSIS — D631 Anemia in chronic kidney disease: Secondary | ICD-10-CM | POA: Diagnosis not present

## 2017-10-11 DIAGNOSIS — E119 Type 2 diabetes mellitus without complications: Secondary | ICD-10-CM | POA: Diagnosis not present

## 2017-10-11 DIAGNOSIS — K7689 Other specified diseases of liver: Secondary | ICD-10-CM | POA: Diagnosis not present

## 2017-10-11 DIAGNOSIS — N186 End stage renal disease: Secondary | ICD-10-CM | POA: Diagnosis not present

## 2017-10-11 DIAGNOSIS — D509 Iron deficiency anemia, unspecified: Secondary | ICD-10-CM | POA: Diagnosis not present

## 2017-10-13 DIAGNOSIS — K7689 Other specified diseases of liver: Secondary | ICD-10-CM | POA: Diagnosis not present

## 2017-10-13 DIAGNOSIS — D509 Iron deficiency anemia, unspecified: Secondary | ICD-10-CM | POA: Diagnosis not present

## 2017-10-13 DIAGNOSIS — N186 End stage renal disease: Secondary | ICD-10-CM | POA: Diagnosis not present

## 2017-10-13 DIAGNOSIS — D631 Anemia in chronic kidney disease: Secondary | ICD-10-CM | POA: Diagnosis not present

## 2017-10-13 DIAGNOSIS — N2581 Secondary hyperparathyroidism of renal origin: Secondary | ICD-10-CM | POA: Diagnosis not present

## 2017-10-13 DIAGNOSIS — E119 Type 2 diabetes mellitus without complications: Secondary | ICD-10-CM | POA: Diagnosis not present

## 2017-10-16 DIAGNOSIS — K7689 Other specified diseases of liver: Secondary | ICD-10-CM | POA: Diagnosis not present

## 2017-10-16 DIAGNOSIS — N2581 Secondary hyperparathyroidism of renal origin: Secondary | ICD-10-CM | POA: Diagnosis not present

## 2017-10-16 DIAGNOSIS — D631 Anemia in chronic kidney disease: Secondary | ICD-10-CM | POA: Diagnosis not present

## 2017-10-16 DIAGNOSIS — E119 Type 2 diabetes mellitus without complications: Secondary | ICD-10-CM | POA: Diagnosis not present

## 2017-10-16 DIAGNOSIS — D509 Iron deficiency anemia, unspecified: Secondary | ICD-10-CM | POA: Diagnosis not present

## 2017-10-16 DIAGNOSIS — N186 End stage renal disease: Secondary | ICD-10-CM | POA: Diagnosis not present

## 2017-10-18 DIAGNOSIS — E119 Type 2 diabetes mellitus without complications: Secondary | ICD-10-CM | POA: Diagnosis not present

## 2017-10-18 DIAGNOSIS — K7689 Other specified diseases of liver: Secondary | ICD-10-CM | POA: Diagnosis not present

## 2017-10-18 DIAGNOSIS — N2581 Secondary hyperparathyroidism of renal origin: Secondary | ICD-10-CM | POA: Diagnosis not present

## 2017-10-18 DIAGNOSIS — D631 Anemia in chronic kidney disease: Secondary | ICD-10-CM | POA: Diagnosis not present

## 2017-10-18 DIAGNOSIS — D509 Iron deficiency anemia, unspecified: Secondary | ICD-10-CM | POA: Diagnosis not present

## 2017-10-18 DIAGNOSIS — N186 End stage renal disease: Secondary | ICD-10-CM | POA: Diagnosis not present

## 2017-10-20 DIAGNOSIS — D631 Anemia in chronic kidney disease: Secondary | ICD-10-CM | POA: Diagnosis not present

## 2017-10-20 DIAGNOSIS — N186 End stage renal disease: Secondary | ICD-10-CM | POA: Diagnosis not present

## 2017-10-20 DIAGNOSIS — K7689 Other specified diseases of liver: Secondary | ICD-10-CM | POA: Diagnosis not present

## 2017-10-20 DIAGNOSIS — E119 Type 2 diabetes mellitus without complications: Secondary | ICD-10-CM | POA: Diagnosis not present

## 2017-10-20 DIAGNOSIS — D509 Iron deficiency anemia, unspecified: Secondary | ICD-10-CM | POA: Diagnosis not present

## 2017-10-20 DIAGNOSIS — N2581 Secondary hyperparathyroidism of renal origin: Secondary | ICD-10-CM | POA: Diagnosis not present

## 2017-10-23 DIAGNOSIS — E119 Type 2 diabetes mellitus without complications: Secondary | ICD-10-CM | POA: Diagnosis not present

## 2017-10-23 DIAGNOSIS — N186 End stage renal disease: Secondary | ICD-10-CM | POA: Diagnosis not present

## 2017-10-23 DIAGNOSIS — K7689 Other specified diseases of liver: Secondary | ICD-10-CM | POA: Diagnosis not present

## 2017-10-23 DIAGNOSIS — N2581 Secondary hyperparathyroidism of renal origin: Secondary | ICD-10-CM | POA: Diagnosis not present

## 2017-10-23 DIAGNOSIS — D509 Iron deficiency anemia, unspecified: Secondary | ICD-10-CM | POA: Diagnosis not present

## 2017-10-23 DIAGNOSIS — D631 Anemia in chronic kidney disease: Secondary | ICD-10-CM | POA: Diagnosis not present

## 2017-10-25 DIAGNOSIS — D509 Iron deficiency anemia, unspecified: Secondary | ICD-10-CM | POA: Diagnosis not present

## 2017-10-25 DIAGNOSIS — N2581 Secondary hyperparathyroidism of renal origin: Secondary | ICD-10-CM | POA: Diagnosis not present

## 2017-10-25 DIAGNOSIS — N186 End stage renal disease: Secondary | ICD-10-CM | POA: Diagnosis not present

## 2017-10-25 DIAGNOSIS — E119 Type 2 diabetes mellitus without complications: Secondary | ICD-10-CM | POA: Diagnosis not present

## 2017-10-25 DIAGNOSIS — K7689 Other specified diseases of liver: Secondary | ICD-10-CM | POA: Diagnosis not present

## 2017-10-25 DIAGNOSIS — D631 Anemia in chronic kidney disease: Secondary | ICD-10-CM | POA: Diagnosis not present

## 2017-10-27 DIAGNOSIS — D509 Iron deficiency anemia, unspecified: Secondary | ICD-10-CM | POA: Diagnosis not present

## 2017-10-27 DIAGNOSIS — E119 Type 2 diabetes mellitus without complications: Secondary | ICD-10-CM | POA: Diagnosis not present

## 2017-10-27 DIAGNOSIS — N2581 Secondary hyperparathyroidism of renal origin: Secondary | ICD-10-CM | POA: Diagnosis not present

## 2017-10-27 DIAGNOSIS — D631 Anemia in chronic kidney disease: Secondary | ICD-10-CM | POA: Diagnosis not present

## 2017-10-27 DIAGNOSIS — K7689 Other specified diseases of liver: Secondary | ICD-10-CM | POA: Diagnosis not present

## 2017-10-27 DIAGNOSIS — N186 End stage renal disease: Secondary | ICD-10-CM | POA: Diagnosis not present

## 2017-10-30 DIAGNOSIS — K7689 Other specified diseases of liver: Secondary | ICD-10-CM | POA: Diagnosis not present

## 2017-10-30 DIAGNOSIS — D509 Iron deficiency anemia, unspecified: Secondary | ICD-10-CM | POA: Diagnosis not present

## 2017-10-30 DIAGNOSIS — N2581 Secondary hyperparathyroidism of renal origin: Secondary | ICD-10-CM | POA: Diagnosis not present

## 2017-10-30 DIAGNOSIS — D631 Anemia in chronic kidney disease: Secondary | ICD-10-CM | POA: Diagnosis not present

## 2017-10-30 DIAGNOSIS — E119 Type 2 diabetes mellitus without complications: Secondary | ICD-10-CM | POA: Diagnosis not present

## 2017-10-30 DIAGNOSIS — N186 End stage renal disease: Secondary | ICD-10-CM | POA: Diagnosis not present

## 2017-11-01 DIAGNOSIS — N186 End stage renal disease: Secondary | ICD-10-CM | POA: Diagnosis not present

## 2017-11-01 DIAGNOSIS — K7689 Other specified diseases of liver: Secondary | ICD-10-CM | POA: Diagnosis not present

## 2017-11-01 DIAGNOSIS — N2581 Secondary hyperparathyroidism of renal origin: Secondary | ICD-10-CM | POA: Diagnosis not present

## 2017-11-01 DIAGNOSIS — E119 Type 2 diabetes mellitus without complications: Secondary | ICD-10-CM | POA: Diagnosis not present

## 2017-11-01 DIAGNOSIS — D631 Anemia in chronic kidney disease: Secondary | ICD-10-CM | POA: Diagnosis not present

## 2017-11-01 DIAGNOSIS — D509 Iron deficiency anemia, unspecified: Secondary | ICD-10-CM | POA: Diagnosis not present

## 2017-11-02 DIAGNOSIS — N186 End stage renal disease: Secondary | ICD-10-CM | POA: Diagnosis not present

## 2017-11-02 DIAGNOSIS — E1129 Type 2 diabetes mellitus with other diabetic kidney complication: Secondary | ICD-10-CM | POA: Diagnosis not present

## 2017-11-02 DIAGNOSIS — Z992 Dependence on renal dialysis: Secondary | ICD-10-CM | POA: Diagnosis not present

## 2017-11-03 DIAGNOSIS — D509 Iron deficiency anemia, unspecified: Secondary | ICD-10-CM | POA: Diagnosis not present

## 2017-11-03 DIAGNOSIS — N2581 Secondary hyperparathyroidism of renal origin: Secondary | ICD-10-CM | POA: Diagnosis not present

## 2017-11-03 DIAGNOSIS — K7689 Other specified diseases of liver: Secondary | ICD-10-CM | POA: Diagnosis not present

## 2017-11-03 DIAGNOSIS — D631 Anemia in chronic kidney disease: Secondary | ICD-10-CM | POA: Diagnosis not present

## 2017-11-03 DIAGNOSIS — E119 Type 2 diabetes mellitus without complications: Secondary | ICD-10-CM | POA: Diagnosis not present

## 2017-11-03 DIAGNOSIS — N186 End stage renal disease: Secondary | ICD-10-CM | POA: Diagnosis not present

## 2017-11-06 DIAGNOSIS — D631 Anemia in chronic kidney disease: Secondary | ICD-10-CM | POA: Diagnosis not present

## 2017-11-06 DIAGNOSIS — D509 Iron deficiency anemia, unspecified: Secondary | ICD-10-CM | POA: Diagnosis not present

## 2017-11-06 DIAGNOSIS — K7689 Other specified diseases of liver: Secondary | ICD-10-CM | POA: Diagnosis not present

## 2017-11-06 DIAGNOSIS — N2581 Secondary hyperparathyroidism of renal origin: Secondary | ICD-10-CM | POA: Diagnosis not present

## 2017-11-06 DIAGNOSIS — N186 End stage renal disease: Secondary | ICD-10-CM | POA: Diagnosis not present

## 2017-11-06 DIAGNOSIS — E119 Type 2 diabetes mellitus without complications: Secondary | ICD-10-CM | POA: Diagnosis not present

## 2017-11-07 ENCOUNTER — Ambulatory Visit: Payer: Medicare Other | Admitting: Orthotics

## 2017-11-07 ENCOUNTER — Ambulatory Visit (INDEPENDENT_AMBULATORY_CARE_PROVIDER_SITE_OTHER): Payer: Medicare Other | Admitting: Podiatry

## 2017-11-07 ENCOUNTER — Encounter: Payer: Self-pay | Admitting: Podiatry

## 2017-11-07 DIAGNOSIS — E1159 Type 2 diabetes mellitus with other circulatory complications: Secondary | ICD-10-CM

## 2017-11-07 DIAGNOSIS — E1142 Type 2 diabetes mellitus with diabetic polyneuropathy: Secondary | ICD-10-CM

## 2017-11-07 DIAGNOSIS — IMO0002 Reserved for concepts with insufficient information to code with codable children: Secondary | ICD-10-CM

## 2017-11-07 DIAGNOSIS — M79675 Pain in left toe(s): Secondary | ICD-10-CM

## 2017-11-07 DIAGNOSIS — Z89431 Acquired absence of right foot: Secondary | ICD-10-CM

## 2017-11-07 DIAGNOSIS — I739 Peripheral vascular disease, unspecified: Secondary | ICD-10-CM

## 2017-11-07 DIAGNOSIS — L84 Corns and callosities: Secondary | ICD-10-CM

## 2017-11-07 DIAGNOSIS — B351 Tinea unguium: Secondary | ICD-10-CM

## 2017-11-07 NOTE — Progress Notes (Signed)
This patient presents to the office today with thick disfigured discolored toenails on his left foot.  Patient has a history of a transmetatarsal amputation right foot.  This patient is also blind.  Patient states the nails on the toes on his left foot are painful walking and wearing his shoes.  He is unable to self treat.  He presents to the office for preventative foot care services.   General Appearance  Alert, conversant and in no acute stress.  Vascular  Dorsalis pedis and posterior pulses are weakly  palpable  bilaterally.  Capillary return is within normal limits  bilaterally. Temperature is within normal limits  Bilaterally.  Neurologic  Senn-Weinstein monofilament wire test deferred.  Nails Thick disfigured discolored nails with subungual debris  from hallux to fifth toes  Left foot.. No evidence of bacterial infection or drainage bilaterally.  Orthopedic  No limitations of motion of motion feet bilaterally.  No crepitus or effusions noted.  No bony pathology or digital deformities noted. TMA right foot.  Skin  normotropic skin with no porokeratosis noted bilaterally.  No signs of infections or ulcers noted.     Onychomycosis  Left foot   ROV  Debridement of long painful nails left foot x 5.  RTC 3 months.  Patient was told to get Diabetic shoes due to toe amputation right foot. Patient to see Liliane Channel.   Gardiner Barefoot DPM

## 2017-11-07 NOTE — Progress Notes (Signed)
Patient came in today for fitting and eval for diabetic shoes: Patient' doctor here is Prudence Davidson  Patient presents with transmet R, PN, DM2, HT L  Patient was measured with brannock 12 w device and cast in foam for custom inserts.  Patient chose [facility]

## 2017-11-08 DIAGNOSIS — D631 Anemia in chronic kidney disease: Secondary | ICD-10-CM | POA: Diagnosis not present

## 2017-11-08 DIAGNOSIS — D509 Iron deficiency anemia, unspecified: Secondary | ICD-10-CM | POA: Diagnosis not present

## 2017-11-08 DIAGNOSIS — E119 Type 2 diabetes mellitus without complications: Secondary | ICD-10-CM | POA: Diagnosis not present

## 2017-11-08 DIAGNOSIS — K7689 Other specified diseases of liver: Secondary | ICD-10-CM | POA: Diagnosis not present

## 2017-11-08 DIAGNOSIS — N2581 Secondary hyperparathyroidism of renal origin: Secondary | ICD-10-CM | POA: Diagnosis not present

## 2017-11-08 DIAGNOSIS — N186 End stage renal disease: Secondary | ICD-10-CM | POA: Diagnosis not present

## 2017-11-10 DIAGNOSIS — N2581 Secondary hyperparathyroidism of renal origin: Secondary | ICD-10-CM | POA: Diagnosis not present

## 2017-11-10 DIAGNOSIS — D509 Iron deficiency anemia, unspecified: Secondary | ICD-10-CM | POA: Diagnosis not present

## 2017-11-10 DIAGNOSIS — K7689 Other specified diseases of liver: Secondary | ICD-10-CM | POA: Diagnosis not present

## 2017-11-10 DIAGNOSIS — N186 End stage renal disease: Secondary | ICD-10-CM | POA: Diagnosis not present

## 2017-11-10 DIAGNOSIS — D631 Anemia in chronic kidney disease: Secondary | ICD-10-CM | POA: Diagnosis not present

## 2017-11-10 DIAGNOSIS — E119 Type 2 diabetes mellitus without complications: Secondary | ICD-10-CM | POA: Diagnosis not present

## 2017-11-13 DIAGNOSIS — K7689 Other specified diseases of liver: Secondary | ICD-10-CM | POA: Diagnosis not present

## 2017-11-13 DIAGNOSIS — D631 Anemia in chronic kidney disease: Secondary | ICD-10-CM | POA: Diagnosis not present

## 2017-11-13 DIAGNOSIS — E119 Type 2 diabetes mellitus without complications: Secondary | ICD-10-CM | POA: Diagnosis not present

## 2017-11-13 DIAGNOSIS — N186 End stage renal disease: Secondary | ICD-10-CM | POA: Diagnosis not present

## 2017-11-13 DIAGNOSIS — N2581 Secondary hyperparathyroidism of renal origin: Secondary | ICD-10-CM | POA: Diagnosis not present

## 2017-11-13 DIAGNOSIS — D509 Iron deficiency anemia, unspecified: Secondary | ICD-10-CM | POA: Diagnosis not present

## 2017-11-15 DIAGNOSIS — N186 End stage renal disease: Secondary | ICD-10-CM | POA: Diagnosis not present

## 2017-11-15 DIAGNOSIS — K7689 Other specified diseases of liver: Secondary | ICD-10-CM | POA: Diagnosis not present

## 2017-11-15 DIAGNOSIS — D631 Anemia in chronic kidney disease: Secondary | ICD-10-CM | POA: Diagnosis not present

## 2017-11-15 DIAGNOSIS — N2581 Secondary hyperparathyroidism of renal origin: Secondary | ICD-10-CM | POA: Diagnosis not present

## 2017-11-15 DIAGNOSIS — D509 Iron deficiency anemia, unspecified: Secondary | ICD-10-CM | POA: Diagnosis not present

## 2017-11-15 DIAGNOSIS — E119 Type 2 diabetes mellitus without complications: Secondary | ICD-10-CM | POA: Diagnosis not present

## 2017-11-17 DIAGNOSIS — D631 Anemia in chronic kidney disease: Secondary | ICD-10-CM | POA: Diagnosis not present

## 2017-11-17 DIAGNOSIS — E119 Type 2 diabetes mellitus without complications: Secondary | ICD-10-CM | POA: Diagnosis not present

## 2017-11-17 DIAGNOSIS — K7689 Other specified diseases of liver: Secondary | ICD-10-CM | POA: Diagnosis not present

## 2017-11-17 DIAGNOSIS — N186 End stage renal disease: Secondary | ICD-10-CM | POA: Diagnosis not present

## 2017-11-17 DIAGNOSIS — D509 Iron deficiency anemia, unspecified: Secondary | ICD-10-CM | POA: Diagnosis not present

## 2017-11-17 DIAGNOSIS — N2581 Secondary hyperparathyroidism of renal origin: Secondary | ICD-10-CM | POA: Diagnosis not present

## 2017-11-20 DIAGNOSIS — R011 Cardiac murmur, unspecified: Secondary | ICD-10-CM | POA: Diagnosis not present

## 2017-11-20 DIAGNOSIS — N2581 Secondary hyperparathyroidism of renal origin: Secondary | ICD-10-CM | POA: Diagnosis not present

## 2017-11-20 DIAGNOSIS — S61412D Laceration without foreign body of left hand, subsequent encounter: Secondary | ICD-10-CM | POA: Diagnosis not present

## 2017-11-20 DIAGNOSIS — D631 Anemia in chronic kidney disease: Secondary | ICD-10-CM | POA: Diagnosis not present

## 2017-11-20 DIAGNOSIS — M5136 Other intervertebral disc degeneration, lumbar region: Secondary | ICD-10-CM | POA: Diagnosis not present

## 2017-11-20 DIAGNOSIS — K7689 Other specified diseases of liver: Secondary | ICD-10-CM | POA: Diagnosis not present

## 2017-11-20 DIAGNOSIS — E119 Type 2 diabetes mellitus without complications: Secondary | ICD-10-CM | POA: Diagnosis not present

## 2017-11-20 DIAGNOSIS — M6281 Muscle weakness (generalized): Secondary | ICD-10-CM | POA: Diagnosis not present

## 2017-11-20 DIAGNOSIS — I10 Essential (primary) hypertension: Secondary | ICD-10-CM | POA: Diagnosis not present

## 2017-11-20 DIAGNOSIS — I69398 Other sequelae of cerebral infarction: Secondary | ICD-10-CM | POA: Diagnosis not present

## 2017-11-20 DIAGNOSIS — M25562 Pain in left knee: Secondary | ICD-10-CM | POA: Diagnosis not present

## 2017-11-20 DIAGNOSIS — N186 End stage renal disease: Secondary | ICD-10-CM | POA: Diagnosis not present

## 2017-11-20 DIAGNOSIS — M810 Age-related osteoporosis without current pathological fracture: Secondary | ICD-10-CM | POA: Diagnosis not present

## 2017-11-20 DIAGNOSIS — Z7982 Long term (current) use of aspirin: Secondary | ICD-10-CM | POA: Diagnosis not present

## 2017-11-20 DIAGNOSIS — I7389 Other specified peripheral vascular diseases: Secondary | ICD-10-CM | POA: Diagnosis not present

## 2017-11-20 DIAGNOSIS — D509 Iron deficiency anemia, unspecified: Secondary | ICD-10-CM | POA: Diagnosis not present

## 2017-11-20 DIAGNOSIS — Z96652 Presence of left artificial knee joint: Secondary | ICD-10-CM | POA: Diagnosis not present

## 2017-11-22 DIAGNOSIS — D631 Anemia in chronic kidney disease: Secondary | ICD-10-CM | POA: Diagnosis not present

## 2017-11-22 DIAGNOSIS — N186 End stage renal disease: Secondary | ICD-10-CM | POA: Diagnosis not present

## 2017-11-22 DIAGNOSIS — D509 Iron deficiency anemia, unspecified: Secondary | ICD-10-CM | POA: Diagnosis not present

## 2017-11-22 DIAGNOSIS — E119 Type 2 diabetes mellitus without complications: Secondary | ICD-10-CM | POA: Diagnosis not present

## 2017-11-22 DIAGNOSIS — K7689 Other specified diseases of liver: Secondary | ICD-10-CM | POA: Diagnosis not present

## 2017-11-22 DIAGNOSIS — N2581 Secondary hyperparathyroidism of renal origin: Secondary | ICD-10-CM | POA: Diagnosis not present

## 2017-11-24 DIAGNOSIS — N2581 Secondary hyperparathyroidism of renal origin: Secondary | ICD-10-CM | POA: Diagnosis not present

## 2017-11-24 DIAGNOSIS — K7689 Other specified diseases of liver: Secondary | ICD-10-CM | POA: Diagnosis not present

## 2017-11-24 DIAGNOSIS — D631 Anemia in chronic kidney disease: Secondary | ICD-10-CM | POA: Diagnosis not present

## 2017-11-24 DIAGNOSIS — E119 Type 2 diabetes mellitus without complications: Secondary | ICD-10-CM | POA: Diagnosis not present

## 2017-11-24 DIAGNOSIS — D509 Iron deficiency anemia, unspecified: Secondary | ICD-10-CM | POA: Diagnosis not present

## 2017-11-24 DIAGNOSIS — N186 End stage renal disease: Secondary | ICD-10-CM | POA: Diagnosis not present

## 2017-11-27 DIAGNOSIS — N186 End stage renal disease: Secondary | ICD-10-CM | POA: Diagnosis not present

## 2017-11-27 DIAGNOSIS — D631 Anemia in chronic kidney disease: Secondary | ICD-10-CM | POA: Diagnosis not present

## 2017-11-27 DIAGNOSIS — D509 Iron deficiency anemia, unspecified: Secondary | ICD-10-CM | POA: Diagnosis not present

## 2017-11-27 DIAGNOSIS — K7689 Other specified diseases of liver: Secondary | ICD-10-CM | POA: Diagnosis not present

## 2017-11-27 DIAGNOSIS — E119 Type 2 diabetes mellitus without complications: Secondary | ICD-10-CM | POA: Diagnosis not present

## 2017-11-27 DIAGNOSIS — N2581 Secondary hyperparathyroidism of renal origin: Secondary | ICD-10-CM | POA: Diagnosis not present

## 2017-11-29 DIAGNOSIS — K7689 Other specified diseases of liver: Secondary | ICD-10-CM | POA: Diagnosis not present

## 2017-11-29 DIAGNOSIS — E119 Type 2 diabetes mellitus without complications: Secondary | ICD-10-CM | POA: Diagnosis not present

## 2017-11-29 DIAGNOSIS — N186 End stage renal disease: Secondary | ICD-10-CM | POA: Diagnosis not present

## 2017-11-29 DIAGNOSIS — N2581 Secondary hyperparathyroidism of renal origin: Secondary | ICD-10-CM | POA: Diagnosis not present

## 2017-11-29 DIAGNOSIS — D509 Iron deficiency anemia, unspecified: Secondary | ICD-10-CM | POA: Diagnosis not present

## 2017-11-29 DIAGNOSIS — D631 Anemia in chronic kidney disease: Secondary | ICD-10-CM | POA: Diagnosis not present

## 2017-12-01 DIAGNOSIS — D509 Iron deficiency anemia, unspecified: Secondary | ICD-10-CM | POA: Diagnosis not present

## 2017-12-01 DIAGNOSIS — D631 Anemia in chronic kidney disease: Secondary | ICD-10-CM | POA: Diagnosis not present

## 2017-12-01 DIAGNOSIS — E119 Type 2 diabetes mellitus without complications: Secondary | ICD-10-CM | POA: Diagnosis not present

## 2017-12-01 DIAGNOSIS — K7689 Other specified diseases of liver: Secondary | ICD-10-CM | POA: Diagnosis not present

## 2017-12-01 DIAGNOSIS — N2581 Secondary hyperparathyroidism of renal origin: Secondary | ICD-10-CM | POA: Diagnosis not present

## 2017-12-01 DIAGNOSIS — N186 End stage renal disease: Secondary | ICD-10-CM | POA: Diagnosis not present

## 2017-12-03 DIAGNOSIS — N186 End stage renal disease: Secondary | ICD-10-CM | POA: Diagnosis not present

## 2017-12-03 DIAGNOSIS — E1129 Type 2 diabetes mellitus with other diabetic kidney complication: Secondary | ICD-10-CM | POA: Diagnosis not present

## 2017-12-03 DIAGNOSIS — Z992 Dependence on renal dialysis: Secondary | ICD-10-CM | POA: Diagnosis not present

## 2017-12-04 DIAGNOSIS — N186 End stage renal disease: Secondary | ICD-10-CM | POA: Diagnosis not present

## 2017-12-04 DIAGNOSIS — E119 Type 2 diabetes mellitus without complications: Secondary | ICD-10-CM | POA: Diagnosis not present

## 2017-12-04 DIAGNOSIS — Z23 Encounter for immunization: Secondary | ICD-10-CM | POA: Diagnosis not present

## 2017-12-04 DIAGNOSIS — K7689 Other specified diseases of liver: Secondary | ICD-10-CM | POA: Diagnosis not present

## 2017-12-04 DIAGNOSIS — D631 Anemia in chronic kidney disease: Secondary | ICD-10-CM | POA: Diagnosis not present

## 2017-12-04 DIAGNOSIS — N2581 Secondary hyperparathyroidism of renal origin: Secondary | ICD-10-CM | POA: Diagnosis not present

## 2017-12-04 DIAGNOSIS — D509 Iron deficiency anemia, unspecified: Secondary | ICD-10-CM | POA: Diagnosis not present

## 2017-12-06 DIAGNOSIS — E119 Type 2 diabetes mellitus without complications: Secondary | ICD-10-CM | POA: Diagnosis not present

## 2017-12-06 DIAGNOSIS — C641 Malignant neoplasm of right kidney, except renal pelvis: Secondary | ICD-10-CM | POA: Diagnosis not present

## 2017-12-06 DIAGNOSIS — N2581 Secondary hyperparathyroidism of renal origin: Secondary | ICD-10-CM | POA: Diagnosis not present

## 2017-12-06 DIAGNOSIS — C61 Malignant neoplasm of prostate: Secondary | ICD-10-CM | POA: Diagnosis not present

## 2017-12-06 DIAGNOSIS — D631 Anemia in chronic kidney disease: Secondary | ICD-10-CM | POA: Diagnosis not present

## 2017-12-06 DIAGNOSIS — D509 Iron deficiency anemia, unspecified: Secondary | ICD-10-CM | POA: Diagnosis not present

## 2017-12-06 DIAGNOSIS — K7689 Other specified diseases of liver: Secondary | ICD-10-CM | POA: Diagnosis not present

## 2017-12-06 DIAGNOSIS — N186 End stage renal disease: Secondary | ICD-10-CM | POA: Diagnosis not present

## 2017-12-08 DIAGNOSIS — N2581 Secondary hyperparathyroidism of renal origin: Secondary | ICD-10-CM | POA: Diagnosis not present

## 2017-12-08 DIAGNOSIS — D509 Iron deficiency anemia, unspecified: Secondary | ICD-10-CM | POA: Diagnosis not present

## 2017-12-08 DIAGNOSIS — K7689 Other specified diseases of liver: Secondary | ICD-10-CM | POA: Diagnosis not present

## 2017-12-08 DIAGNOSIS — E119 Type 2 diabetes mellitus without complications: Secondary | ICD-10-CM | POA: Diagnosis not present

## 2017-12-08 DIAGNOSIS — D631 Anemia in chronic kidney disease: Secondary | ICD-10-CM | POA: Diagnosis not present

## 2017-12-08 DIAGNOSIS — N186 End stage renal disease: Secondary | ICD-10-CM | POA: Diagnosis not present

## 2017-12-11 DIAGNOSIS — D631 Anemia in chronic kidney disease: Secondary | ICD-10-CM | POA: Diagnosis not present

## 2017-12-11 DIAGNOSIS — K7689 Other specified diseases of liver: Secondary | ICD-10-CM | POA: Diagnosis not present

## 2017-12-11 DIAGNOSIS — D509 Iron deficiency anemia, unspecified: Secondary | ICD-10-CM | POA: Diagnosis not present

## 2017-12-11 DIAGNOSIS — E119 Type 2 diabetes mellitus without complications: Secondary | ICD-10-CM | POA: Diagnosis not present

## 2017-12-11 DIAGNOSIS — N186 End stage renal disease: Secondary | ICD-10-CM | POA: Diagnosis not present

## 2017-12-11 DIAGNOSIS — N2581 Secondary hyperparathyroidism of renal origin: Secondary | ICD-10-CM | POA: Diagnosis not present

## 2017-12-13 DIAGNOSIS — N186 End stage renal disease: Secondary | ICD-10-CM | POA: Diagnosis not present

## 2017-12-13 DIAGNOSIS — D509 Iron deficiency anemia, unspecified: Secondary | ICD-10-CM | POA: Diagnosis not present

## 2017-12-13 DIAGNOSIS — K7689 Other specified diseases of liver: Secondary | ICD-10-CM | POA: Diagnosis not present

## 2017-12-13 DIAGNOSIS — D631 Anemia in chronic kidney disease: Secondary | ICD-10-CM | POA: Diagnosis not present

## 2017-12-13 DIAGNOSIS — N2581 Secondary hyperparathyroidism of renal origin: Secondary | ICD-10-CM | POA: Diagnosis not present

## 2017-12-13 DIAGNOSIS — E119 Type 2 diabetes mellitus without complications: Secondary | ICD-10-CM | POA: Diagnosis not present

## 2017-12-15 DIAGNOSIS — E119 Type 2 diabetes mellitus without complications: Secondary | ICD-10-CM | POA: Diagnosis not present

## 2017-12-15 DIAGNOSIS — D631 Anemia in chronic kidney disease: Secondary | ICD-10-CM | POA: Diagnosis not present

## 2017-12-15 DIAGNOSIS — D509 Iron deficiency anemia, unspecified: Secondary | ICD-10-CM | POA: Diagnosis not present

## 2017-12-15 DIAGNOSIS — N186 End stage renal disease: Secondary | ICD-10-CM | POA: Diagnosis not present

## 2017-12-15 DIAGNOSIS — N2581 Secondary hyperparathyroidism of renal origin: Secondary | ICD-10-CM | POA: Diagnosis not present

## 2017-12-15 DIAGNOSIS — K7689 Other specified diseases of liver: Secondary | ICD-10-CM | POA: Diagnosis not present

## 2017-12-18 ENCOUNTER — Other Ambulatory Visit (HOSPITAL_COMMUNITY): Payer: Self-pay | Admitting: Interventional Radiology

## 2017-12-18 DIAGNOSIS — N2581 Secondary hyperparathyroidism of renal origin: Secondary | ICD-10-CM | POA: Diagnosis not present

## 2017-12-18 DIAGNOSIS — C641 Malignant neoplasm of right kidney, except renal pelvis: Secondary | ICD-10-CM

## 2017-12-18 DIAGNOSIS — D509 Iron deficiency anemia, unspecified: Secondary | ICD-10-CM | POA: Diagnosis not present

## 2017-12-18 DIAGNOSIS — E119 Type 2 diabetes mellitus without complications: Secondary | ICD-10-CM | POA: Diagnosis not present

## 2017-12-18 DIAGNOSIS — K7689 Other specified diseases of liver: Secondary | ICD-10-CM | POA: Diagnosis not present

## 2017-12-18 DIAGNOSIS — N186 End stage renal disease: Secondary | ICD-10-CM | POA: Diagnosis not present

## 2017-12-18 DIAGNOSIS — D631 Anemia in chronic kidney disease: Secondary | ICD-10-CM | POA: Diagnosis not present

## 2017-12-19 DIAGNOSIS — E559 Vitamin D deficiency, unspecified: Secondary | ICD-10-CM | POA: Diagnosis not present

## 2017-12-19 DIAGNOSIS — E1151 Type 2 diabetes mellitus with diabetic peripheral angiopathy without gangrene: Secondary | ICD-10-CM | POA: Diagnosis not present

## 2017-12-19 DIAGNOSIS — J984 Other disorders of lung: Secondary | ICD-10-CM | POA: Diagnosis not present

## 2017-12-19 DIAGNOSIS — E7849 Other hyperlipidemia: Secondary | ICD-10-CM | POA: Diagnosis not present

## 2017-12-19 DIAGNOSIS — Z8553 Personal history of malignant neoplasm of renal pelvis: Secondary | ICD-10-CM | POA: Diagnosis not present

## 2017-12-19 DIAGNOSIS — C641 Malignant neoplasm of right kidney, except renal pelvis: Secondary | ICD-10-CM | POA: Diagnosis not present

## 2017-12-19 DIAGNOSIS — Z125 Encounter for screening for malignant neoplasm of prostate: Secondary | ICD-10-CM | POA: Diagnosis not present

## 2017-12-20 DIAGNOSIS — K7689 Other specified diseases of liver: Secondary | ICD-10-CM | POA: Diagnosis not present

## 2017-12-20 DIAGNOSIS — E119 Type 2 diabetes mellitus without complications: Secondary | ICD-10-CM | POA: Diagnosis not present

## 2017-12-20 DIAGNOSIS — D509 Iron deficiency anemia, unspecified: Secondary | ICD-10-CM | POA: Diagnosis not present

## 2017-12-20 DIAGNOSIS — N2581 Secondary hyperparathyroidism of renal origin: Secondary | ICD-10-CM | POA: Diagnosis not present

## 2017-12-20 DIAGNOSIS — D631 Anemia in chronic kidney disease: Secondary | ICD-10-CM | POA: Diagnosis not present

## 2017-12-20 DIAGNOSIS — N186 End stage renal disease: Secondary | ICD-10-CM | POA: Diagnosis not present

## 2017-12-22 DIAGNOSIS — D631 Anemia in chronic kidney disease: Secondary | ICD-10-CM | POA: Diagnosis not present

## 2017-12-22 DIAGNOSIS — N2581 Secondary hyperparathyroidism of renal origin: Secondary | ICD-10-CM | POA: Diagnosis not present

## 2017-12-22 DIAGNOSIS — E119 Type 2 diabetes mellitus without complications: Secondary | ICD-10-CM | POA: Diagnosis not present

## 2017-12-22 DIAGNOSIS — K7689 Other specified diseases of liver: Secondary | ICD-10-CM | POA: Diagnosis not present

## 2017-12-22 DIAGNOSIS — N186 End stage renal disease: Secondary | ICD-10-CM | POA: Diagnosis not present

## 2017-12-22 DIAGNOSIS — D509 Iron deficiency anemia, unspecified: Secondary | ICD-10-CM | POA: Diagnosis not present

## 2017-12-25 DIAGNOSIS — E119 Type 2 diabetes mellitus without complications: Secondary | ICD-10-CM | POA: Diagnosis not present

## 2017-12-25 DIAGNOSIS — D631 Anemia in chronic kidney disease: Secondary | ICD-10-CM | POA: Diagnosis not present

## 2017-12-25 DIAGNOSIS — D509 Iron deficiency anemia, unspecified: Secondary | ICD-10-CM | POA: Diagnosis not present

## 2017-12-25 DIAGNOSIS — N2581 Secondary hyperparathyroidism of renal origin: Secondary | ICD-10-CM | POA: Diagnosis not present

## 2017-12-25 DIAGNOSIS — K7689 Other specified diseases of liver: Secondary | ICD-10-CM | POA: Diagnosis not present

## 2017-12-25 DIAGNOSIS — N186 End stage renal disease: Secondary | ICD-10-CM | POA: Diagnosis not present

## 2017-12-26 ENCOUNTER — Other Ambulatory Visit: Payer: Medicare Other

## 2017-12-26 DIAGNOSIS — E7849 Other hyperlipidemia: Secondary | ICD-10-CM | POA: Diagnosis not present

## 2017-12-26 DIAGNOSIS — I25810 Atherosclerosis of coronary artery bypass graft(s) without angina pectoris: Secondary | ICD-10-CM | POA: Diagnosis not present

## 2017-12-26 DIAGNOSIS — E1139 Type 2 diabetes mellitus with other diabetic ophthalmic complication: Secondary | ICD-10-CM | POA: Diagnosis not present

## 2017-12-26 DIAGNOSIS — Z992 Dependence on renal dialysis: Secondary | ICD-10-CM | POA: Diagnosis not present

## 2017-12-26 DIAGNOSIS — Z1389 Encounter for screening for other disorder: Secondary | ICD-10-CM | POA: Diagnosis not present

## 2017-12-26 DIAGNOSIS — N186 End stage renal disease: Secondary | ICD-10-CM | POA: Diagnosis not present

## 2017-12-26 DIAGNOSIS — E1151 Type 2 diabetes mellitus with diabetic peripheral angiopathy without gangrene: Secondary | ICD-10-CM | POA: Diagnosis not present

## 2017-12-26 DIAGNOSIS — Z6825 Body mass index (BMI) 25.0-25.9, adult: Secondary | ICD-10-CM | POA: Diagnosis not present

## 2017-12-26 DIAGNOSIS — E1129 Type 2 diabetes mellitus with other diabetic kidney complication: Secondary | ICD-10-CM | POA: Diagnosis not present

## 2017-12-26 DIAGNOSIS — Z0000 Encounter for general adult medical examination without abnormal findings: Secondary | ICD-10-CM | POA: Diagnosis not present

## 2017-12-26 DIAGNOSIS — Z89429 Acquired absence of other toe(s), unspecified side: Secondary | ICD-10-CM | POA: Diagnosis not present

## 2017-12-26 DIAGNOSIS — C7900 Secondary malignant neoplasm of unspecified kidney and renal pelvis: Secondary | ICD-10-CM | POA: Diagnosis not present

## 2017-12-27 DIAGNOSIS — D509 Iron deficiency anemia, unspecified: Secondary | ICD-10-CM | POA: Diagnosis not present

## 2017-12-27 DIAGNOSIS — N2581 Secondary hyperparathyroidism of renal origin: Secondary | ICD-10-CM | POA: Diagnosis not present

## 2017-12-27 DIAGNOSIS — E119 Type 2 diabetes mellitus without complications: Secondary | ICD-10-CM | POA: Diagnosis not present

## 2017-12-27 DIAGNOSIS — D631 Anemia in chronic kidney disease: Secondary | ICD-10-CM | POA: Diagnosis not present

## 2017-12-27 DIAGNOSIS — K7689 Other specified diseases of liver: Secondary | ICD-10-CM | POA: Diagnosis not present

## 2017-12-27 DIAGNOSIS — N186 End stage renal disease: Secondary | ICD-10-CM | POA: Diagnosis not present

## 2017-12-29 DIAGNOSIS — N186 End stage renal disease: Secondary | ICD-10-CM | POA: Diagnosis not present

## 2017-12-29 DIAGNOSIS — N2581 Secondary hyperparathyroidism of renal origin: Secondary | ICD-10-CM | POA: Diagnosis not present

## 2017-12-29 DIAGNOSIS — K7689 Other specified diseases of liver: Secondary | ICD-10-CM | POA: Diagnosis not present

## 2017-12-29 DIAGNOSIS — D509 Iron deficiency anemia, unspecified: Secondary | ICD-10-CM | POA: Diagnosis not present

## 2017-12-29 DIAGNOSIS — D631 Anemia in chronic kidney disease: Secondary | ICD-10-CM | POA: Diagnosis not present

## 2017-12-29 DIAGNOSIS — E119 Type 2 diabetes mellitus without complications: Secondary | ICD-10-CM | POA: Diagnosis not present

## 2018-01-01 DIAGNOSIS — K7689 Other specified diseases of liver: Secondary | ICD-10-CM | POA: Diagnosis not present

## 2018-01-01 DIAGNOSIS — E119 Type 2 diabetes mellitus without complications: Secondary | ICD-10-CM | POA: Diagnosis not present

## 2018-01-01 DIAGNOSIS — N2581 Secondary hyperparathyroidism of renal origin: Secondary | ICD-10-CM | POA: Diagnosis not present

## 2018-01-01 DIAGNOSIS — N186 End stage renal disease: Secondary | ICD-10-CM | POA: Diagnosis not present

## 2018-01-01 DIAGNOSIS — D631 Anemia in chronic kidney disease: Secondary | ICD-10-CM | POA: Diagnosis not present

## 2018-01-01 DIAGNOSIS — D509 Iron deficiency anemia, unspecified: Secondary | ICD-10-CM | POA: Diagnosis not present

## 2018-01-02 DIAGNOSIS — E1129 Type 2 diabetes mellitus with other diabetic kidney complication: Secondary | ICD-10-CM | POA: Diagnosis not present

## 2018-01-02 DIAGNOSIS — N186 End stage renal disease: Secondary | ICD-10-CM | POA: Diagnosis not present

## 2018-01-02 DIAGNOSIS — Z992 Dependence on renal dialysis: Secondary | ICD-10-CM | POA: Diagnosis not present

## 2018-01-03 DIAGNOSIS — D631 Anemia in chronic kidney disease: Secondary | ICD-10-CM | POA: Diagnosis not present

## 2018-01-03 DIAGNOSIS — D509 Iron deficiency anemia, unspecified: Secondary | ICD-10-CM | POA: Diagnosis not present

## 2018-01-03 DIAGNOSIS — N2581 Secondary hyperparathyroidism of renal origin: Secondary | ICD-10-CM | POA: Diagnosis not present

## 2018-01-03 DIAGNOSIS — K7689 Other specified diseases of liver: Secondary | ICD-10-CM | POA: Diagnosis not present

## 2018-01-03 DIAGNOSIS — E119 Type 2 diabetes mellitus without complications: Secondary | ICD-10-CM | POA: Diagnosis not present

## 2018-01-03 DIAGNOSIS — N186 End stage renal disease: Secondary | ICD-10-CM | POA: Diagnosis not present

## 2018-01-05 DIAGNOSIS — D631 Anemia in chronic kidney disease: Secondary | ICD-10-CM | POA: Diagnosis not present

## 2018-01-05 DIAGNOSIS — K7689 Other specified diseases of liver: Secondary | ICD-10-CM | POA: Diagnosis not present

## 2018-01-05 DIAGNOSIS — N2581 Secondary hyperparathyroidism of renal origin: Secondary | ICD-10-CM | POA: Diagnosis not present

## 2018-01-05 DIAGNOSIS — D509 Iron deficiency anemia, unspecified: Secondary | ICD-10-CM | POA: Diagnosis not present

## 2018-01-05 DIAGNOSIS — N186 End stage renal disease: Secondary | ICD-10-CM | POA: Diagnosis not present

## 2018-01-05 DIAGNOSIS — E119 Type 2 diabetes mellitus without complications: Secondary | ICD-10-CM | POA: Diagnosis not present

## 2018-01-08 DIAGNOSIS — K7689 Other specified diseases of liver: Secondary | ICD-10-CM | POA: Diagnosis not present

## 2018-01-08 DIAGNOSIS — D631 Anemia in chronic kidney disease: Secondary | ICD-10-CM | POA: Diagnosis not present

## 2018-01-08 DIAGNOSIS — N2581 Secondary hyperparathyroidism of renal origin: Secondary | ICD-10-CM | POA: Diagnosis not present

## 2018-01-08 DIAGNOSIS — D509 Iron deficiency anemia, unspecified: Secondary | ICD-10-CM | POA: Diagnosis not present

## 2018-01-08 DIAGNOSIS — N186 End stage renal disease: Secondary | ICD-10-CM | POA: Diagnosis not present

## 2018-01-08 DIAGNOSIS — E119 Type 2 diabetes mellitus without complications: Secondary | ICD-10-CM | POA: Diagnosis not present

## 2018-01-10 DIAGNOSIS — K7689 Other specified diseases of liver: Secondary | ICD-10-CM | POA: Diagnosis not present

## 2018-01-10 DIAGNOSIS — E1129 Type 2 diabetes mellitus with other diabetic kidney complication: Secondary | ICD-10-CM | POA: Diagnosis not present

## 2018-01-10 DIAGNOSIS — N2581 Secondary hyperparathyroidism of renal origin: Secondary | ICD-10-CM | POA: Diagnosis not present

## 2018-01-10 DIAGNOSIS — D509 Iron deficiency anemia, unspecified: Secondary | ICD-10-CM | POA: Diagnosis not present

## 2018-01-10 DIAGNOSIS — N186 End stage renal disease: Secondary | ICD-10-CM | POA: Diagnosis not present

## 2018-01-10 DIAGNOSIS — D631 Anemia in chronic kidney disease: Secondary | ICD-10-CM | POA: Diagnosis not present

## 2018-01-10 DIAGNOSIS — E119 Type 2 diabetes mellitus without complications: Secondary | ICD-10-CM | POA: Diagnosis not present

## 2018-01-11 ENCOUNTER — Encounter: Payer: Self-pay | Admitting: Genetics

## 2018-01-12 DIAGNOSIS — D509 Iron deficiency anemia, unspecified: Secondary | ICD-10-CM | POA: Diagnosis not present

## 2018-01-12 DIAGNOSIS — N2581 Secondary hyperparathyroidism of renal origin: Secondary | ICD-10-CM | POA: Diagnosis not present

## 2018-01-12 DIAGNOSIS — N186 End stage renal disease: Secondary | ICD-10-CM | POA: Diagnosis not present

## 2018-01-12 DIAGNOSIS — K7689 Other specified diseases of liver: Secondary | ICD-10-CM | POA: Diagnosis not present

## 2018-01-12 DIAGNOSIS — D631 Anemia in chronic kidney disease: Secondary | ICD-10-CM | POA: Diagnosis not present

## 2018-01-12 DIAGNOSIS — E119 Type 2 diabetes mellitus without complications: Secondary | ICD-10-CM | POA: Diagnosis not present

## 2018-01-15 DIAGNOSIS — E119 Type 2 diabetes mellitus without complications: Secondary | ICD-10-CM | POA: Diagnosis not present

## 2018-01-15 DIAGNOSIS — D631 Anemia in chronic kidney disease: Secondary | ICD-10-CM | POA: Diagnosis not present

## 2018-01-15 DIAGNOSIS — N186 End stage renal disease: Secondary | ICD-10-CM | POA: Diagnosis not present

## 2018-01-15 DIAGNOSIS — K7689 Other specified diseases of liver: Secondary | ICD-10-CM | POA: Diagnosis not present

## 2018-01-15 DIAGNOSIS — N2581 Secondary hyperparathyroidism of renal origin: Secondary | ICD-10-CM | POA: Diagnosis not present

## 2018-01-15 DIAGNOSIS — D509 Iron deficiency anemia, unspecified: Secondary | ICD-10-CM | POA: Diagnosis not present

## 2018-01-17 DIAGNOSIS — E119 Type 2 diabetes mellitus without complications: Secondary | ICD-10-CM | POA: Diagnosis not present

## 2018-01-17 DIAGNOSIS — N186 End stage renal disease: Secondary | ICD-10-CM | POA: Diagnosis not present

## 2018-01-17 DIAGNOSIS — D509 Iron deficiency anemia, unspecified: Secondary | ICD-10-CM | POA: Diagnosis not present

## 2018-01-17 DIAGNOSIS — D631 Anemia in chronic kidney disease: Secondary | ICD-10-CM | POA: Diagnosis not present

## 2018-01-17 DIAGNOSIS — N2581 Secondary hyperparathyroidism of renal origin: Secondary | ICD-10-CM | POA: Diagnosis not present

## 2018-01-17 DIAGNOSIS — K7689 Other specified diseases of liver: Secondary | ICD-10-CM | POA: Diagnosis not present

## 2018-01-19 DIAGNOSIS — N186 End stage renal disease: Secondary | ICD-10-CM | POA: Diagnosis not present

## 2018-01-19 DIAGNOSIS — D631 Anemia in chronic kidney disease: Secondary | ICD-10-CM | POA: Diagnosis not present

## 2018-01-19 DIAGNOSIS — N2581 Secondary hyperparathyroidism of renal origin: Secondary | ICD-10-CM | POA: Diagnosis not present

## 2018-01-19 DIAGNOSIS — E119 Type 2 diabetes mellitus without complications: Secondary | ICD-10-CM | POA: Diagnosis not present

## 2018-01-19 DIAGNOSIS — D509 Iron deficiency anemia, unspecified: Secondary | ICD-10-CM | POA: Diagnosis not present

## 2018-01-19 DIAGNOSIS — K7689 Other specified diseases of liver: Secondary | ICD-10-CM | POA: Diagnosis not present

## 2018-01-22 DIAGNOSIS — N186 End stage renal disease: Secondary | ICD-10-CM | POA: Diagnosis not present

## 2018-01-22 DIAGNOSIS — E119 Type 2 diabetes mellitus without complications: Secondary | ICD-10-CM | POA: Diagnosis not present

## 2018-01-22 DIAGNOSIS — K7689 Other specified diseases of liver: Secondary | ICD-10-CM | POA: Diagnosis not present

## 2018-01-22 DIAGNOSIS — D509 Iron deficiency anemia, unspecified: Secondary | ICD-10-CM | POA: Diagnosis not present

## 2018-01-22 DIAGNOSIS — N2581 Secondary hyperparathyroidism of renal origin: Secondary | ICD-10-CM | POA: Diagnosis not present

## 2018-01-22 DIAGNOSIS — D631 Anemia in chronic kidney disease: Secondary | ICD-10-CM | POA: Diagnosis not present

## 2018-01-24 DIAGNOSIS — D509 Iron deficiency anemia, unspecified: Secondary | ICD-10-CM | POA: Diagnosis not present

## 2018-01-24 DIAGNOSIS — D631 Anemia in chronic kidney disease: Secondary | ICD-10-CM | POA: Diagnosis not present

## 2018-01-24 DIAGNOSIS — N2581 Secondary hyperparathyroidism of renal origin: Secondary | ICD-10-CM | POA: Diagnosis not present

## 2018-01-24 DIAGNOSIS — N186 End stage renal disease: Secondary | ICD-10-CM | POA: Diagnosis not present

## 2018-01-24 DIAGNOSIS — K7689 Other specified diseases of liver: Secondary | ICD-10-CM | POA: Diagnosis not present

## 2018-01-24 DIAGNOSIS — E119 Type 2 diabetes mellitus without complications: Secondary | ICD-10-CM | POA: Diagnosis not present

## 2018-01-26 DIAGNOSIS — E119 Type 2 diabetes mellitus without complications: Secondary | ICD-10-CM | POA: Diagnosis not present

## 2018-01-26 DIAGNOSIS — K7689 Other specified diseases of liver: Secondary | ICD-10-CM | POA: Diagnosis not present

## 2018-01-26 DIAGNOSIS — N2581 Secondary hyperparathyroidism of renal origin: Secondary | ICD-10-CM | POA: Diagnosis not present

## 2018-01-26 DIAGNOSIS — D631 Anemia in chronic kidney disease: Secondary | ICD-10-CM | POA: Diagnosis not present

## 2018-01-26 DIAGNOSIS — D509 Iron deficiency anemia, unspecified: Secondary | ICD-10-CM | POA: Diagnosis not present

## 2018-01-26 DIAGNOSIS — N186 End stage renal disease: Secondary | ICD-10-CM | POA: Diagnosis not present

## 2018-01-29 DIAGNOSIS — E119 Type 2 diabetes mellitus without complications: Secondary | ICD-10-CM | POA: Diagnosis not present

## 2018-01-29 DIAGNOSIS — K7689 Other specified diseases of liver: Secondary | ICD-10-CM | POA: Diagnosis not present

## 2018-01-29 DIAGNOSIS — N2581 Secondary hyperparathyroidism of renal origin: Secondary | ICD-10-CM | POA: Diagnosis not present

## 2018-01-29 DIAGNOSIS — D509 Iron deficiency anemia, unspecified: Secondary | ICD-10-CM | POA: Diagnosis not present

## 2018-01-29 DIAGNOSIS — N186 End stage renal disease: Secondary | ICD-10-CM | POA: Diagnosis not present

## 2018-01-29 DIAGNOSIS — D631 Anemia in chronic kidney disease: Secondary | ICD-10-CM | POA: Diagnosis not present

## 2018-01-30 ENCOUNTER — Encounter: Payer: Self-pay | Admitting: Thoracic Surgery (Cardiothoracic Vascular Surgery)

## 2018-01-30 ENCOUNTER — Institutional Professional Consult (permissible substitution) (INDEPENDENT_AMBULATORY_CARE_PROVIDER_SITE_OTHER): Payer: Medicare Other | Admitting: Thoracic Surgery (Cardiothoracic Vascular Surgery)

## 2018-01-30 VITALS — BP 112/66 | HR 57 | Resp 20 | Ht 76.0 in | Wt 198.0 lb

## 2018-01-30 DIAGNOSIS — R222 Localized swelling, mass and lump, trunk: Secondary | ICD-10-CM | POA: Diagnosis not present

## 2018-01-30 NOTE — Progress Notes (Signed)
PCP is [name], MD Referring Provider is Raynelle Bring, MD  Chief Complaint  Patient presents with  . Consult    Surgical eval, on chest wall lesion , CT C/A/P 12/19/17, PET Scan 07/27/17, HX of renal cancer    HPI: [name] is sent for consultation regarding a finding of his right chest wall on CT.  [name] is a 67 year old gentleman with a history of bilateral renal cell carcinomas, status post CAD status post coronary artery bypass grafting by Dr. Cyndia Bent in 2013, prostate cancer, hypertension, hyperlipidemia, type 2 diabetes, and end-stage renal disease on dialysis.  History was obtained from Mrs. Pischke.  He apparently had a right nephrectomy in [facility] and also had a partial colectomy and liver resection on the right.  He also had radiofrequency ablation to the tumor bed for residual tumor.  He recently had a follow-up chest CT.  It showed a 2.1 x 1.5 cm right posterolateral chest wall lesion between the 10th and 11th ribs.  This lesion was slightly larger than it was back in April.  It was mildly hypermetabolic on the PET/CT that time.  He denies any pain in that region.  His activities are very limited.  He is on dialysis 3 days a week.  He is blind.  He is followed by Dr. Percival Spanish from cardiology.  He has not seen him recently. Past Medical History:  Diagnosis Date  . Anemia   . Arthritis    "back, neck" (07/28/2014)  . Blind    both eyes removed   . Bruises easily   . CAD, NATIVE VESSEL 01/08/2008      . Cellulitis late 1980's   "hospitalized; wrapped both legs; several times; no OR for this"  . Colon polyps   . Diabetic neuropathy ([place])   . Dialysis patient ([place])   . DM type 2 (diabetes mellitus, type 2) ([place])    no medications (07/28/2014) diet and excersie controlled not onmeds for 14-15 years   . Dysrhythmia    afib   . ESRD (end stage renal disease) on dialysis [facility])    [name]; Mon, Wed, Fri (07/28/2014)  . Fall    hx of fall and required left  hip surgery   . Family history of breast cancer    mother  . Heart murmur    hx of  . History of blood product transfusion   . HYPERLIPIDEMIA-MIXED 01/08/2008  . HYPERTENSION 01/27/2009   BP low , hx of HTN, Currently on meds to raise BP  . Hypotension   . Kidney stones   . Low blood pressure   . OVERWEIGHT/OBESITY 01/08/2008   Lost 205 lbs through diet and exercise.    . Peripheral vascular disease ([place])    vein stripping  . Pneumonia 2000;s X 1  . Prostate cancer ([place])   . Prosthetic eye globe    both eyes  . Renal cell carcinoma 2001 and 2003   "both kidneys"  . Renal insufficiency     Past Surgical History:  Procedure Laterality Date  . AV FISTULA PLACEMENT Left 02/2010   LFA  . AV FISTULA PLACEMENT Left 01/12/2016   Procedure: LEFT BRACHIOCEPHALIC ARTERIOVENOUS (AV) FISTULA CREATION;  Surgeon: Angelia Mould, MD;  Location: [place];  Service: Vascular;  Laterality: Left;  . CHOLECYSTECTOMY OPEN  1992  . COLONOSCOPY  06/14/2011   Procedure: COLONOSCOPY;  Surgeon: [name], MD;  Location: [facility];  Service: Endoscopy;  Laterality: N/A;  . COLONOSCOPY  N/A 07/24/2012   Procedure: COLONOSCOPY;  Surgeon: [name], MD;  Location: [facility];  Service: Endoscopy;  Laterality: N/A;  . COLONOSCOPY    . COLONOSCOPY N/A 11/04/2015   Procedure: COLONOSCOPY;  Surgeon: Manus Gunning, MD;  Location: [facility] ENDOSCOPY;  Service: Gastroenterology;  Laterality: N/A;  . COLONOSCOPY WITH PROPOFOL N/A 05/13/2014   Procedure: COLONOSCOPY WITH PROPOFOL;  Surgeon: [name], MD;  Location: [facility];  Service: Endoscopy;  Laterality: N/A;  . CORONARY ARTERY BYPASS GRAFT  09/29/2011   Procedure: CORONARY ARTERY BYPASS GRAFTING (CABG);  Surgeon: [name], MD;  Location: [place];  Service: Open Heart Surgery;  Laterality: N/A;  Coronary Artery Bypass Graft times two utilizing the left internal mammary artery and the right greater saphenous vein harvested  endoscopically.  . CYSTOSCOPY WITH RETROGRADE PYELOGRAM, URETEROSCOPY AND STENT PLACEMENT Left 07/28/2014   Procedure: CYSTOSCOPY WITH RETROGRADE PYELOGRAM, URETERAL BALLOON DILITATION, URETEROSCOPY AND LEFT STENT PLACEMENT;  Surgeon: [name], MD;  Location: [facility];  Service: Urology;  Laterality: Left;  . ENUCLEATION  2003; 2006   bilateral; "diabetes; pain"  . FOOT AMPUTATION Right ~ 2002   right;  partial; "infection"  . FRACTURE SURGERY     hip fx  . HIP PINNING,CANNULATED Left 12/31/2013   Procedure: CANNULATED HIP PINNING;  Surgeon: Renette Butters, MD;  Location: [place];  Service: Orthopedics;  Laterality: Left;  Carm, FX Table, [employer]  . HOT HEMOSTASIS  06/14/2011   Procedure: HOT HEMOSTASIS (ARGON PLASMA COAGULATION/BICAP);  Surgeon: [name], MD;  Location[facility];  Service: Endoscopy;  Laterality: N/A;  . INSERTION OF DIALYSIS CATHETER Right 01/12/2016   Procedure: INSERTION OF DIALYSIS CATHETER;  Surgeon: Angelia Mould, MD;  Location: [place];  Service: Vascular;  Laterality: Right;  . INSERTION PROSTATE RADIATION SEED  03/2012  . IR GENERIC HISTORICAL  01/13/2016   IR RADIOLOGIST EVAL & MGMT 01/13/2016 Aletta Edouard, MD [facility] INTERV RAD  . IR RADIOLOGIST EVAL & MGMT  03/02/2017  . IR RADIOLOGIST EVAL & MGMT  05/18/2017  . IR RADIOLOGIST EVAL & MGMT  07/27/2017  . LAPAROTOMY N/A 04/07/2016   Procedure: EXPLORATORY LAPAROTOMY AND RESECTION OF RETROPERITONEAL MASS;  Surgeon: Raynelle Bring, MD;  [facility];  Service: Urology;  Laterality: N/A;  . LEFT HEART CATHETERIZATION WITH CORONARY ANGIOGRAM N/A 09/27/2011   Procedure: LEFT HEART CATHETERIZATION WITH CORONARY ANGIOGRAM;  Surgeon: [name], MD;  Location: [facility] CATH LAB;  Service: Cardiovascular;  Laterality: N/A;  . LIGATION OF ARTERIOVENOUS  FISTULA Left 01/12/2016   Procedure: LIGATION OF LEFT RADIOCEPHALIC ARTERIOVENOUS  FISTULA;  Surgeon: Angelia Mould, MD;  Location: [place];  Service:  Vascular;  Laterality: Left;  . LIGATION OF COMPETING BRANCHES OF ARTERIOVENOUS FISTULA Left 07/29/2014   Procedure: LIGATION OF COMPETING BRANCHES OF LEFT ARM ARTERIOVENOUS FISTULA;  Surgeon: [name], MD;  Location: [place];  Service: Vascular;  Laterality: Left;  . NEPHRECTOMY Right 01/30/2015   done at [place]  . PARTIAL COLECTOMY Right 04/07/2016   Procedure: RIGHT COLECTOMY AND PARTIAL LIVER RESECTION;  Surgeon: Raynelle Bring, MD;  Location: [facility];  Service: Urology;  Laterality: Right;  . RADIOFREQUENCY ABLATION N/A 04/19/2017   Procedure: CT MICROWAVE THERMAL ABLATION;  Surgeon: Aletta Edouard, MD;  Location: [facility];  Service: Anesthesiology;  Laterality: N/A;  . RADIOFREQUENCY ABLATION KIDNEY  2010-2012   "twice; one on each side; for cancer"  . REVISON OF ARTERIOVENOUS FISTULA Left 09/01/2015   Procedure:  EXPLORATION LEFT LOWER ARM  RADIOCEPHALIC ARTERIOVENOUS FISTULA;  Surgeon: Conrad Exeland, MD;  Location: [place];  Service: Vascular;  Laterality: Left;  [name] VARICOSE VEIN SURGERY  mid 1980's    BLE; "knees down; both legs; 2 separate times"    Family History  Problem Relation Age of Onset  . Cancer Mother 71       breast, spine and ovarian  . Heart disease Mother   . Hyperlipidemia Mother   . Hypertension Mother   . Varicose Veins Mother   . Emphysema Father   . Cancer Father 38       kidney, prostate  . Heart disease Father   . Hyperlipidemia Father   . Hypertension Father   . Cancer Sister        ovarian  . Cancer Brother        lung  . Heart disease Brother   . Hyperlipidemia Brother   . Hypertension Brother   . Heart attack Brother   . Peripheral vascular disease Brother   . Cancer Unknown   . Coronary artery disease Unknown   . Kidney disease Unknown   . Breast cancer Unknown   . Ovarian cancer Unknown   . Lung cancer Unknown   . Diabetes Unknown   . Malignant hyperthermia Neg Hx     Social History Social History   Tobacco Use  . Smoking  status: Never Smoker  . Smokeless tobacco: Never Used  Substance Use Topics  . Alcohol use: No    Alcohol/week: 0.0 standard drinks  . Drug use: No    Current Outpatient Medications  Medication Sig Dispense Refill  . aspirin EC 81 MG tablet Take 81 mg by mouth at bedtime.    [name] atorvastatin (LIPITOR) 10 MG tablet Take 10 mg by mouth daily.    [name] dextromethorphan-guaiFENesin (MUCINEX DM) 30-600 MG 12hr tablet Take 1 tablet by mouth 2 (two) times daily.    . Methoxy PEG-Epoetin Beta (MIRCERA) 50 MCG/0.3ML SOSY Inject 225 mcg as directed every 14 (fourteen) days.     . midodrine (PROAMATINE) 10 MG tablet Take 1 tablet (10 mg total) by mouth 3 (three) times daily with meals. 90 tablet 0  . multivitamin (RENA-VIT) TABS tablet Take 1 tablet by mouth daily.    . Probiotic Product (ALIGN) 4 MG CAPS Take 1 capsule by mouth daily.    . sevelamer carbonate (RENVELA) 800 MG tablet Take 2,400-4,800 mg by mouth See admin instructions. Take 6 tablets (4800 mg) by mouth 3 times daily with meals and 3 tablets (2400 mg) 2 times daily with snacks     No current facility-administered medications for this visit.     Allergies  Allergen Reactions  . Codeine Other (See Comments)     [place]  . Tape Other (See Comments)    Plastic tape tears skin off, please use paper tape instead.    Review of Systems  Constitutional: Negative for activity change, appetite change and unexpected weight change.  HENT: Negative for trouble swallowing and voice change.   Eyes: Positive for visual disturbance (Blind).  Respiratory: Positive for cough. Negative for shortness of breath.   Cardiovascular: Negative for chest pain.  Gastrointestinal: Negative for abdominal distention and abdominal pain.  Genitourinary:       ESRD on dialysis  Musculoskeletal: Positive for neck pain and neck stiffness.  Neurological: Negative for seizures and weakness.  Hematological: Bruises/bleeds easily.  All other systems  reviewed and are negative.  BP 112/66   Pulse (!) 57   Resp 20   Ht 6\' 4"  (1.93 m)   Wt 198 lb (89.8 kg)   SpO2 98% Comment: RA  BMI 24.10 kg/m  Physical Exam  Constitutional: He is oriented to person, place, and time. No distress.  Frail-appearing 67 year old man  HENT:  Head: Normocephalic and atraumatic.  Eyes:  -Prosthesis bilateral  Neck: Neck supple. No thyromegaly present.  Cardiovascular: Normal rate, regular rhythm and normal heart sounds. Exam reveals no friction rub.  No murmur heard. Pulmonary/Chest: Effort normal and breath sounds normal. No respiratory distress. He has no wheezes. He has no rales.  Abdominal: Soft. He exhibits no distension. There is no tenderness.  Musculoskeletal: He exhibits no edema.  Lymphadenopathy:    He has no cervical adenopathy.  Neurological: He is alert and oriented to person, place, and time. No cranial nerve deficit. He exhibits normal muscle tone. Coordination normal.  Skin: Skin is warm and dry.  Vitals reviewed.    Diagnostic Tests: I personally reviewed the CT images from 12/19/2017 Report is as follows Impression 2.1 x 1.5 cm right posterior lateral chest wall lesion along the 10th/11th interspace, slowly progressive, suspicious for chest wall metastasis.  Consider tissue sampling for confirmation. Status post right nephrectomy with post ablation changes in the nephrectomy bed.  No progressive findings to suggest residual/viable tumor. Additional ancillary findings as above.  Impression: [name] is a 67 year old gentleman with a complicated medical history including bilateral renal cell carcinoma.  He has end-stage renal disease and is on hemodialysis.  He has atherosclerotic cardiovascular disease with previous coronary bypass grafting by Dr. Cyndia Bent about 5 years ago.  He has a 2 cm soft tissue density in his right posterior lateral chest wall.  This is really in the right flank region between the 10th and 11th ribs.   This is suspicious for metastatic renal cell carcinoma and could be due to seeding along the tract during 1 of his ablation procedures.  I am not able to feel this area on exam.  He does not have any tenderness in that area.  I am going to review the films with [name] from interventional radiology who is done his ablation to see if there would be any role for that (which I doubt given its chest wall location).  Plan: We will discuss with [name] whether CT-guided needle biopsy would be an option for diagnosis.  Will discuss with Dr. Percival Spanish whether or not he needs any additional cardiac work-up  Will return in 2 weeks to discuss management of this chest wall finding.  Melrose Nakayama, MD [facility] [phone]

## 2018-01-31 ENCOUNTER — Encounter: Payer: Self-pay | Admitting: Urology

## 2018-01-31 ENCOUNTER — Telehealth: Payer: Self-pay | Admitting: *Deleted

## 2018-01-31 DIAGNOSIS — E119 Type 2 diabetes mellitus without complications: Secondary | ICD-10-CM | POA: Diagnosis not present

## 2018-01-31 DIAGNOSIS — K7689 Other specified diseases of liver: Secondary | ICD-10-CM | POA: Diagnosis not present

## 2018-01-31 DIAGNOSIS — D631 Anemia in chronic kidney disease: Secondary | ICD-10-CM | POA: Diagnosis not present

## 2018-01-31 DIAGNOSIS — N186 End stage renal disease: Secondary | ICD-10-CM | POA: Diagnosis not present

## 2018-01-31 DIAGNOSIS — N2581 Secondary hyperparathyroidism of renal origin: Secondary | ICD-10-CM | POA: Diagnosis not present

## 2018-01-31 DIAGNOSIS — D509 Iron deficiency anemia, unspecified: Secondary | ICD-10-CM | POA: Diagnosis not present

## 2018-01-31 NOTE — Telephone Encounter (Signed)
Call and spoke with pt, app made November 14th

## 2018-01-31 NOTE — Telephone Encounter (Signed)
-----   Message from [name], MD sent at 01/31/2018  9:18 AM EDT ----- [name],  I have not seen him in over a year.  I will get him in next week to see him.  Jake ----- Message ----- From: Melrose Nakayama, MD Sent: 01/30/2018   4:59 PM EDT To: [name], MD  Pincus Sanes saw [name] in the office today regarding a chest wall mass.  This is probably a renal cell carcinoma metastasis.  We're considering a small chest wall resection to remove that.  He is got an extensive cardiac history including a CABG by Gaspar Bidding 6 years ago.  He is not having any anginal symptoms but is relatively sedentary.  He has gone through multiple major operations in the last few years without any significant cardiac issues that I am aware of.  Additionally to check to make sure he did not think he needed any additional cardiac work-up before surgery should we decide to go that route.  Thanks  [name]

## 2018-02-01 ENCOUNTER — Other Ambulatory Visit: Payer: Self-pay | Admitting: Urology

## 2018-02-01 DIAGNOSIS — R222 Localized swelling, mass and lump, trunk: Secondary | ICD-10-CM

## 2018-02-01 DIAGNOSIS — C641 Malignant neoplasm of right kidney, except renal pelvis: Secondary | ICD-10-CM

## 2018-02-02 DIAGNOSIS — K7689 Other specified diseases of liver: Secondary | ICD-10-CM | POA: Diagnosis not present

## 2018-02-02 DIAGNOSIS — Z992 Dependence on renal dialysis: Secondary | ICD-10-CM | POA: Diagnosis not present

## 2018-02-02 DIAGNOSIS — D509 Iron deficiency anemia, unspecified: Secondary | ICD-10-CM | POA: Diagnosis not present

## 2018-02-02 DIAGNOSIS — N186 End stage renal disease: Secondary | ICD-10-CM | POA: Diagnosis not present

## 2018-02-02 DIAGNOSIS — N2581 Secondary hyperparathyroidism of renal origin: Secondary | ICD-10-CM | POA: Diagnosis not present

## 2018-02-02 DIAGNOSIS — E1129 Type 2 diabetes mellitus with other diabetic kidney complication: Secondary | ICD-10-CM | POA: Diagnosis not present

## 2018-02-02 DIAGNOSIS — D631 Anemia in chronic kidney disease: Secondary | ICD-10-CM | POA: Diagnosis not present

## 2018-02-02 DIAGNOSIS — E119 Type 2 diabetes mellitus without complications: Secondary | ICD-10-CM | POA: Diagnosis not present

## 2018-02-05 DIAGNOSIS — D509 Iron deficiency anemia, unspecified: Secondary | ICD-10-CM | POA: Diagnosis not present

## 2018-02-05 DIAGNOSIS — N2581 Secondary hyperparathyroidism of renal origin: Secondary | ICD-10-CM | POA: Diagnosis not present

## 2018-02-05 DIAGNOSIS — K7689 Other specified diseases of liver: Secondary | ICD-10-CM | POA: Diagnosis not present

## 2018-02-05 DIAGNOSIS — E119 Type 2 diabetes mellitus without complications: Secondary | ICD-10-CM | POA: Diagnosis not present

## 2018-02-05 DIAGNOSIS — N186 End stage renal disease: Secondary | ICD-10-CM | POA: Diagnosis not present

## 2018-02-05 DIAGNOSIS — D631 Anemia in chronic kidney disease: Secondary | ICD-10-CM | POA: Diagnosis not present

## 2018-02-06 ENCOUNTER — Ambulatory Visit (INDEPENDENT_AMBULATORY_CARE_PROVIDER_SITE_OTHER): Payer: Medicare Other | Admitting: Podiatry

## 2018-02-06 ENCOUNTER — Encounter: Payer: Self-pay | Admitting: Podiatry

## 2018-02-06 DIAGNOSIS — M79675 Pain in left toe(s): Secondary | ICD-10-CM

## 2018-02-06 DIAGNOSIS — Z89431 Acquired absence of right foot: Secondary | ICD-10-CM | POA: Diagnosis not present

## 2018-02-06 DIAGNOSIS — B351 Tinea unguium: Secondary | ICD-10-CM

## 2018-02-06 DIAGNOSIS — I739 Peripheral vascular disease, unspecified: Secondary | ICD-10-CM | POA: Diagnosis not present

## 2018-02-06 DIAGNOSIS — E1159 Type 2 diabetes mellitus with other circulatory complications: Secondary | ICD-10-CM

## 2018-02-06 NOTE — Progress Notes (Signed)
This patient presents to the office today with thick disfigured discolored toenails on his left foot.  Patient has a history of a transmetatarsal amputation right foot.  This patient is also blind.  Patient states the nails on the toes on his left foot are painful walking and wearing his shoes.  He is unable to self treat.  He presents to the office for preventative foot care services.   General Appearance  Alert, conversant and in no acute stress.  Vascular  Dorsalis pedis and posterior pulses are weakly  palpable  bilaterally.  Capillary return is within normal limits  bilaterally. Temperature is within normal limits  Bilaterally.  Neurologic  Senn-Weinstein monofilament wire test deferred.  Nails Thick disfigured discolored nails with subungual debris  from hallux to fifth toes  Left foot.. No evidence of bacterial infection or drainage bilaterally.  Orthopedic  No limitations of motion of motion feet bilaterally.  No crepitus or effusions noted.  No bony pathology or digital deformities noted. TMA right foot.  Skin  normotropic skin with no porokeratosis noted bilaterally.  No signs of infections or ulcers noted.     Onychomycosis  Left foot   ROV  Debridement of long painful nails left foot x 5.  RTC 3 months.  Patient to pick up diabetic shoes.   Gardiner Barefoot DPM

## 2018-02-07 ENCOUNTER — Other Ambulatory Visit: Payer: Self-pay | Admitting: Physician Assistant

## 2018-02-07 DIAGNOSIS — E119 Type 2 diabetes mellitus without complications: Secondary | ICD-10-CM | POA: Diagnosis not present

## 2018-02-07 DIAGNOSIS — D631 Anemia in chronic kidney disease: Secondary | ICD-10-CM | POA: Diagnosis not present

## 2018-02-07 DIAGNOSIS — K7689 Other specified diseases of liver: Secondary | ICD-10-CM | POA: Diagnosis not present

## 2018-02-07 DIAGNOSIS — N2581 Secondary hyperparathyroidism of renal origin: Secondary | ICD-10-CM | POA: Diagnosis not present

## 2018-02-07 DIAGNOSIS — N186 End stage renal disease: Secondary | ICD-10-CM | POA: Diagnosis not present

## 2018-02-07 DIAGNOSIS — D509 Iron deficiency anemia, unspecified: Secondary | ICD-10-CM | POA: Diagnosis not present

## 2018-02-08 ENCOUNTER — Ambulatory Visit (HOSPITAL_COMMUNITY)
Admission: RE | Admit: 2018-02-08 | Discharge: 2018-02-08 | Disposition: A | Discharge status: Home / Self Care | Payer: Medicare Other | Source: Ambulatory Visit | Attending: Urology | Admitting: Urology

## 2018-02-08 ENCOUNTER — Other Ambulatory Visit: Payer: Self-pay

## 2018-02-08 ENCOUNTER — Encounter (HOSPITAL_COMMUNITY): Payer: Self-pay

## 2018-02-08 DIAGNOSIS — Z992 Dependence on renal dialysis: Secondary | ICD-10-CM | POA: Insufficient documentation

## 2018-02-08 DIAGNOSIS — Z85528 Personal history of other malignant neoplasm of kidney: Secondary | ICD-10-CM | POA: Diagnosis not present

## 2018-02-08 DIAGNOSIS — R222 Localized swelling, mass and lump, trunk: Secondary | ICD-10-CM | POA: Diagnosis not present

## 2018-02-08 DIAGNOSIS — Z8546 Personal history of malignant neoplasm of prostate: Secondary | ICD-10-CM | POA: Insufficient documentation

## 2018-02-08 DIAGNOSIS — Z87442 Personal history of urinary calculi: Secondary | ICD-10-CM | POA: Diagnosis not present

## 2018-02-08 DIAGNOSIS — I120 Hypertensive chronic kidney disease with stage 5 chronic kidney disease or end stage renal disease: Secondary | ICD-10-CM | POA: Insufficient documentation

## 2018-02-08 DIAGNOSIS — E1151 Type 2 diabetes mellitus with diabetic peripheral angiopathy without gangrene: Secondary | ICD-10-CM | POA: Insufficient documentation

## 2018-02-08 DIAGNOSIS — D649 Anemia, unspecified: Secondary | ICD-10-CM | POA: Diagnosis not present

## 2018-02-08 DIAGNOSIS — E1122 Type 2 diabetes mellitus with diabetic chronic kidney disease: Secondary | ICD-10-CM | POA: Insufficient documentation

## 2018-02-08 DIAGNOSIS — N186 End stage renal disease: Secondary | ICD-10-CM | POA: Diagnosis not present

## 2018-02-08 DIAGNOSIS — I4891 Unspecified atrial fibrillation: Secondary | ICD-10-CM | POA: Insufficient documentation

## 2018-02-08 DIAGNOSIS — C493 Malignant neoplasm of connective and soft tissue of thorax: Secondary | ICD-10-CM | POA: Diagnosis not present

## 2018-02-08 DIAGNOSIS — Z79899 Other long term (current) drug therapy: Secondary | ICD-10-CM | POA: Diagnosis not present

## 2018-02-08 DIAGNOSIS — Z888 Allergy status to other drugs, medicaments and biological substances status: Secondary | ICD-10-CM | POA: Insufficient documentation

## 2018-02-08 DIAGNOSIS — E1140 Type 2 diabetes mellitus with diabetic neuropathy, unspecified: Secondary | ICD-10-CM | POA: Insufficient documentation

## 2018-02-08 DIAGNOSIS — Z89431 Acquired absence of right foot: Secondary | ICD-10-CM | POA: Diagnosis not present

## 2018-02-08 DIAGNOSIS — Z885 Allergy status to narcotic agent status: Secondary | ICD-10-CM | POA: Insufficient documentation

## 2018-02-08 DIAGNOSIS — C7989 Secondary malignant neoplasm of other specified sites: Secondary | ICD-10-CM | POA: Insufficient documentation

## 2018-02-08 DIAGNOSIS — I2510 Atherosclerotic heart disease of native coronary artery without angina pectoris: Secondary | ICD-10-CM | POA: Insufficient documentation

## 2018-02-08 DIAGNOSIS — Z905 Acquired absence of kidney: Secondary | ICD-10-CM | POA: Insufficient documentation

## 2018-02-08 DIAGNOSIS — Z8249 Family history of ischemic heart disease and other diseases of the circulatory system: Secondary | ICD-10-CM | POA: Insufficient documentation

## 2018-02-08 DIAGNOSIS — Z9049 Acquired absence of other specified parts of digestive tract: Secondary | ICD-10-CM | POA: Insufficient documentation

## 2018-02-08 DIAGNOSIS — C641 Malignant neoplasm of right kidney, except renal pelvis: Secondary | ICD-10-CM | POA: Insufficient documentation

## 2018-02-08 DIAGNOSIS — Z7982 Long term (current) use of aspirin: Secondary | ICD-10-CM | POA: Insufficient documentation

## 2018-02-08 DIAGNOSIS — Z951 Presence of aortocoronary bypass graft: Secondary | ICD-10-CM | POA: Diagnosis not present

## 2018-02-08 LAB — CBC
HCT: 36.7 % — ABNORMAL LOW (ref 39.0–52.0)
Hemoglobin: 11.9 g/dL — ABNORMAL LOW (ref 13.0–17.0)
MCH: 36 pg — AB (ref 26.0–34.0)
MCHC: 32.4 g/dL (ref 30.0–36.0)
MCV: 110.9 fL — AB (ref 80.0–100.0)
Platelets: 88 10*3/uL — ABNORMAL LOW (ref 150–400)
RBC: 3.31 MIL/uL — AB (ref 4.22–5.81)
RDW: 14.5 % (ref 11.5–15.5)
WBC: 4.4 10*3/uL (ref 4.0–10.5)
nRBC: 0 % (ref 0.0–0.2)

## 2018-02-08 LAB — GLUCOSE, CAPILLARY: Glucose-Capillary: 90 mg/dL (ref 70–99)

## 2018-02-08 LAB — PROTIME-INR
INR: 1
PROTHROMBIN TIME: 13.1 s (ref 11.4–15.2)

## 2018-02-08 LAB — APTT: APTT: 35 s (ref 24–36)

## 2018-02-08 MED ORDER — FENTANYL CITRATE (PF) 100 MCG/2ML IJ SOLN
INTRAMUSCULAR | Status: AC | PRN
  Administered 2018-02-08 (×2): 25 ug via INTRAVENOUS

## 2018-02-08 MED ORDER — MIDAZOLAM HCL 2 MG/2ML IJ SOLN
INTRAMUSCULAR | Status: AC | PRN
  Administered 2018-02-08 (×4): 0.5 mg via INTRAVENOUS

## 2018-02-08 MED ORDER — FLUMAZENIL 0.5 MG/5ML IV SOLN
INTRAVENOUS | Status: AC
  Filled 2018-02-08: qty 5

## 2018-02-08 MED ORDER — FENTANYL CITRATE (PF) 100 MCG/2ML IJ SOLN
INTRAMUSCULAR | Status: AC
  Filled 2018-02-08: qty 2

## 2018-02-08 MED ORDER — SODIUM CHLORIDE 0.9 % IV SOLN
INTRAVENOUS | Status: DC
  Administered 2018-02-08: 08:00:00 via INTRAVENOUS

## 2018-02-08 MED ORDER — NALOXONE HCL 0.4 MG/ML IJ SOLN
INTRAMUSCULAR | Status: AC
  Filled 2018-02-08: qty 1

## 2018-02-08 MED ORDER — MIDAZOLAM HCL 2 MG/2ML IJ SOLN
INTRAMUSCULAR | Status: AC
  Filled 2018-02-08: qty 4

## 2018-02-08 MED ORDER — ATROPINE SULFATE 0.4 MG/ML IJ SOLN
INTRAMUSCULAR | Status: AC
  Filled 2018-02-08: qty 1

## 2018-02-08 NOTE — H&P (Signed)
Chief Complaint: Patient was seen in consultation today for right chest wall lesion biopsy  Referring Physician(s): [place]  Supervising Physician: Aletta Edouard  Patient Status: [facility] - Out-pt  History of Present Illness: [name] is a 67 y.o. male with a past medical history significant for anemia, bilateral enucleation, CAD, PAD, DM with neuropathy, ESRD on HD, a.fib, hypotension, nephrolithiasis, prostate cancer and bilateral renal cell carcinoma s/p right nephrectomy and ablation on 04/19/17 with [name]. Patient presents today for image guided biopsy of previously known right chest wall mass which was noted to be enlarging on CT abdomen and pelvis from 12/19/17. Per CT report the lesion measures 2.1 x 1.5 cm and is located in the right posterolateral chest wall along the 10th/11th interspace and is suspicious for chest wall metastasis. Images were reviewed by [name] who approves image guided biopsy today.  Patient reports he has been feeling fine, denies any complaints aside from being hungry. He did not take any medications this morning. He understands the procedure to be performed today and wishes to proceed.  Past Medical History:  Diagnosis Date  . Anemia   . Arthritis    "back, neck" (07/28/2014)  . Blind    both eyes removed   . Bruises easily   . CAD, NATIVE VESSEL 01/08/2008      . Cellulitis late 1980's   "hospitalized; wrapped both legs; several times; no OR for this"  . Colon polyps   . Diabetic neuropathy ([place])   . Dialysis patient ([place])   . DM type 2 (diabetes mellitus, type 2) ([place])    no medications (07/28/2014) diet and excersie controlled not onmeds for 14-15 years   . Dysrhythmia    afib   . ESRD (end stage renal disease) on dialysis [facility])    [name]; Mon, Wed, Fri (07/28/2014)  . Fall    hx of fall and required left hip surgery   . Family history of breast cancer    mother  . Heart murmur    hx of  . History of blood  product transfusion   . HYPERLIPIDEMIA-MIXED 01/08/2008  . HYPERTENSION 01/27/2009   BP low , hx of HTN, Currently on meds to raise BP  . Hypotension   . Kidney stones   . Low blood pressure   . OVERWEIGHT/OBESITY 01/08/2008   Lost 205 lbs through diet and exercise.    . Peripheral vascular disease ([place])    vein stripping  . Pneumonia 2000;s X 1  . Prostate cancer ([place])   . Prosthetic eye globe    both eyes  . Renal cell carcinoma 2001 and 2003   "both kidneys"  . Renal insufficiency     Past Surgical History:  Procedure Laterality Date  . AV FISTULA PLACEMENT Left 02/2010   LFA  . AV FISTULA PLACEMENT Left 01/12/2016   Procedure: LEFT BRACHIOCEPHALIC ARTERIOVENOUS (AV) FISTULA CREATION;  Surgeon: Angelia Mould, MD;  Location: [place];  Service: Vascular;  Laterality: Left;  . CHOLECYSTECTOMY OPEN  1992  . COLONOSCOPY  06/14/2011   Procedure: COLONOSCOPY;  Surgeon: [name], MD;  Location: [facility];  Service: Endoscopy;  Laterality: N/A;  . COLONOSCOPY N/A 07/24/2012   Procedure: COLONOSCOPY;  Surgeon: [name], MD;  Location: [facility];  Service: Endoscopy;  Laterality: N/A;  . COLONOSCOPY    . COLONOSCOPY N/A 11/04/2015   Procedure: COLONOSCOPY;  Surgeon: Manus Gunning, MD;  Location: [facility] ENDOSCOPY;  Service: Gastroenterology;  Laterality:  N/A;  . COLONOSCOPY WITH PROPOFOL N/A 05/13/2014   Procedure: COLONOSCOPY WITH PROPOFOL;  Surgeon: [name], MD;  [facility];  Service: Endoscopy;  Laterality: N/A;  . CORONARY ARTERY BYPASS GRAFT  09/29/2011   Procedure: CORONARY ARTERY BYPASS GRAFTING (CABG);  Surgeon: [name], MD;  Location: [place];  Service: Open Heart Surgery;  Laterality: N/A;  Coronary Artery Bypass Graft times two utilizing the left internal mammary artery and the right greater saphenous vein harvested endoscopically.  . CYSTOSCOPY WITH RETROGRADE PYELOGRAM, URETEROSCOPY AND STENT PLACEMENT Left 07/28/2014    Procedure: CYSTOSCOPY WITH RETROGRADE PYELOGRAM, URETERAL BALLOON DILITATION, URETEROSCOPY AND LEFT STENT PLACEMENT;  Surgeon: [name], MD;  Location: [facility];  Service: Urology;  Laterality: Left;  . ENUCLEATION  2003; 2006   bilateral; "diabetes; pain"  . FOOT AMPUTATION Right ~ 2002   right;  partial; "infection"  . FRACTURE SURGERY     hip fx  . HIP PINNING,CANNULATED Left 12/31/2013   Procedure: CANNULATED HIP PINNING;  Surgeon: Renette Butters, MD;  Location: [place];  Service: Orthopedics;  Laterality: Left;  Carm, FX Table, [employer]  . HOT HEMOSTASIS  06/14/2011   Procedure: HOT HEMOSTASIS (ARGON PLASMA COAGULATION/BICAP);  Surgeon: [name], MD;  Location[facility];  Service: Endoscopy;  Laterality: N/A;  . INSERTION OF DIALYSIS CATHETER Right 01/12/2016   Procedure: INSERTION OF DIALYSIS CATHETER;  Surgeon: Angelia Mould, MD;  Location: [place];  Service: Vascular;  Laterality: Right;  . INSERTION PROSTATE RADIATION SEED  03/2012  . IR GENERIC HISTORICAL  01/13/2016   IR RADIOLOGIST EVAL & MGMT 01/13/2016 Aletta Edouard, MD [facility] INTERV RAD  . IR RADIOLOGIST EVAL & MGMT  03/02/2017  . IR RADIOLOGIST EVAL & MGMT  05/18/2017  . IR RADIOLOGIST EVAL & MGMT  07/27/2017  . LAPAROTOMY N/A 04/07/2016   Procedure: EXPLORATORY LAPAROTOMY AND RESECTION OF RETROPERITONEAL MASS;  Surgeon: Raynelle Bring, MD;  [facility];  Service: Urology;  Laterality: N/A;  . LEFT HEART CATHETERIZATION WITH CORONARY ANGIOGRAM N/A 09/27/2011   Procedure: LEFT HEART CATHETERIZATION WITH CORONARY ANGIOGRAM;  Surgeon: [name], MD;  Location: [facility] CATH LAB;  Service: Cardiovascular;  Laterality: N/A;  . LIGATION OF ARTERIOVENOUS  FISTULA Left 01/12/2016   Procedure: LIGATION OF LEFT RADIOCEPHALIC ARTERIOVENOUS  FISTULA;  Surgeon: Angelia Mould, MD;  Location: [place];  Service: Vascular;  Laterality: Left;  . LIGATION OF COMPETING BRANCHES OF ARTERIOVENOUS FISTULA Left 07/29/2014    Procedure: LIGATION OF COMPETING BRANCHES OF LEFT ARM ARTERIOVENOUS FISTULA;  Surgeon: [name], MD;  Location: [place];  Service: Vascular;  Laterality: Left;  . NEPHRECTOMY Right 01/30/2015   done at [place]  . PARTIAL COLECTOMY Right 04/07/2016   Procedure: RIGHT COLECTOMY AND PARTIAL LIVER RESECTION;  Surgeon: Raynelle Bring, MD;  Location: [facility];  Service: Urology;  Laterality: Right;  . RADIOFREQUENCY ABLATION N/A 04/19/2017   Procedure: CT MICROWAVE THERMAL ABLATION;  Surgeon: Aletta Edouard, MD;  Location: [facility];  Service: Anesthesiology;  Laterality: N/A;  . RADIOFREQUENCY ABLATION KIDNEY  2010-2012   "twice; one on each side; for cancer"  . REVISON OF ARTERIOVENOUS FISTULA Left 09/01/2015   Procedure: EXPLORATION LEFT LOWER ARM  RADIOCEPHALIC ARTERIOVENOUS FISTULA;  Surgeon: Conrad Collins, MD;  Location: [place];  Service: Vascular;  Laterality: Left;  [name] VARICOSE VEIN SURGERY  mid 1980's    BLE; "knees down; both legs; 2 separate times"    Allergies: Codeine and Tape  Medications: Prior to Admission medications   Medication Sig Start Date End Date Taking? Authorizing Provider  aspirin EC 81 MG tablet Take 81 mg by mouth at bedtime.    [provider]  atorvastatin (LIPITOR) 10 MG tablet Take 10 mg by mouth daily.    [provider]  dextromethorphan-guaiFENesin (MUCINEX DM) 30-600 MG 12hr tablet Take 1 tablet by mouth 2 (two) times daily.    [provider]  Methoxy PEG-Epoetin Beta (MIRCERA) 50 MCG/0.3ML SOSY Inject 225 mcg as directed every 14 (fourteen) days.     [provider]  midodrine (PROAMATINE) 10 MG tablet Take 1 tablet (10 mg total) by mouth 3 (three) times daily with meals. 11/05/15   Reyne Dumas, MD  multivitamin (RENA-VIT) TABS tablet Take 1 tablet by mouth daily.    [provider]  Probiotic Product (ALIGN) 4 MG CAPS Take 1 capsule by mouth daily.    [provider]  sevelamer carbonate (RENVELA) 800 MG  tablet Take 2,400-4,800 mg by mouth See admin instructions. Take 6 tablets (4800 mg) by mouth 3 times daily with meals and 3 tablets (2400 mg) 2 times daily with snacks 06/26/14   [provider]     Family History  Problem Relation Age of Onset  . Cancer Mother 64       breast, spine and ovarian  . Heart disease Mother   . Hyperlipidemia Mother   . Hypertension Mother   . Varicose Veins Mother   . Emphysema Father   . Cancer Father 44       kidney, prostate  . Heart disease Father   . Hyperlipidemia Father   . Hypertension Father   . Cancer Sister        ovarian  . Cancer Brother        lung  . Heart disease Brother   . Hyperlipidemia Brother   . Hypertension Brother   . Heart attack Brother   . Peripheral vascular disease Brother   . Cancer Unknown   . Coronary artery disease Unknown   . Kidney disease Unknown   . Breast cancer Unknown   . Ovarian cancer Unknown   . Lung cancer Unknown   . Diabetes Unknown   . Malignant hyperthermia Neg Hx     Social History   Socioeconomic History  . Marital status: Married    Spouse name: Not on file  . Number of children: Not on file  . Years of education: Not on file  . Highest education level: Not on file  Occupational History  . Not on file  Social Needs  . Financial resource strain: Not on file  . Food insecurity:    Worry: Not on file    Inability: Not on file  . Transportation needs:    Medical: Not on file    Non-medical: Not on file  Tobacco Use  . Smoking status: Never Smoker  . Smokeless tobacco: Never Used  Substance and Sexual Activity  . Alcohol use: No    Alcohol/week: 0.0 standard drinks  . Drug use: No  . Sexual activity: Never  Lifestyle  . Physical activity:    Days per week: Not on file    Minutes per session: Not on file  . Stress: Not on file  Relationships  . Social connections:    Talks on phone: Not on file    Gets together: Not on file    Attends religious service: Not on  file    Active  member of club or organization: Not on file    Attends meetings of clubs or organizations: Not on file    Relationship status: Not on file  Other Topics Concern  . Not on file  Social History Narrative  . Not on file     Review of Systems: A 12 point ROS discussed and pertinent positives are indicated in the HPI above.  All other systems are negative.  Review of Systems  Constitutional: Negative for appetite change, chills and fever.  Respiratory: Negative for cough and shortness of breath.   Cardiovascular: Negative for chest pain.  Gastrointestinal: Negative for abdominal pain, diarrhea, nausea and vomiting.  Skin: Negative for rash.  Neurological: Negative for dizziness and syncope.  Psychiatric/Behavioral: Negative for confusion.    Vital Signs: BP 132/67   Pulse (!) 45   Temp (!) 97.5 F (36.4 C) (Oral)   Resp 16   SpO2 99%   Physical Exam  Constitutional: He is oriented to person, place, and time. No distress.  HENT:  Head: Normocephalic.  Eyes:  Bilateral enucleation  Cardiovascular: Regular rhythm and normal heart sounds.  bradycardic  Pulmonary/Chest: Effort normal and breath sounds normal.  Abdominal: Soft. Bowel sounds are normal. He exhibits no distension. There is no tenderness.  Neurological: He is alert and oriented to person, place, and time.  Skin: Skin is warm and dry. He is not diaphoretic.  Psychiatric: He has a normal mood and affect. His behavior is normal. Judgment and thought content normal.  Vitals reviewed.    MD Evaluation Airway: WNL Heart: WNL Abdomen: WNL Chest/ Lungs: WNL ASA  Classification: 3 Mallampati/Airway Score: One   Imaging: No results found.  Labs:  CBC: Recent Labs    04/19/17 0701 04/20/17 0410  WBC 4.8 3.3*  HGB 10.7* 9.8*  HCT 33.1* 29.8*  PLT 104* 89*    COAGS: Recent Labs    04/19/17 0701 02/08/18 0751  INR 1.06 1.00  APTT  --  35    BMP: Recent Labs    04/19/17 0701  NA  142  K 3.8  CL 102  CO2 27  GLUCOSE 124*  BUN 95*  CALCIUM 9.2  CREATININE 7.37*  GFRNONAA 7*  GFRAA 8*    LIVER FUNCTION TESTS: No results for input(s): BILITOT, AST, ALT, ALKPHOS, PROT, ALBUMIN in the last 8760 hours.  TUMOR MARKERS: No results for input(s): AFPTM, CEA, CA199, CHROMGRNA in the last 8760 hours.  Assessment and Plan:  Patient with history of prostate cancer, renal cell carcinoma s/p right nephrectomy and nephrectomy bed ablation with [name] on 04/19/17 who presents today for biopsy of right chest wall mass noted on CT abdomen/pelvis from 12/19/17. He understands indication for the procedure today and wishes to proceed.  He has been NPO since last night, he does not take blood thinning medications, afebrile, INR 1.00, CBC pending at the time of this note writing.   Risks and benefits discussed with the patient including, but not limited to bleeding, infection, damage to adjacent structures or low yield requiring additional tests.  All of the patient's questions were answered, patient is agreeable to proceed.  Consent signed and in chart.  Thank you for this interesting consult.  I greatly enjoyed meeting [name] and look forward to participating in their care.  A copy of this report was sent to the requesting provider on this date.  Electronically Signed: Joaquim Nam, PA-C 02/08/2018, 7:59 AM   I spent a total of  15 Minutes in face to face in clinical consultation, greater than 50% of which was counseling/coordinating care for right chest wall mass biopsy.

## 2018-02-08 NOTE — Procedures (Signed)
Interventional Radiology Procedure Note  Procedure: CT guided biopsy of right chest wall mass  Complications: None  Estimated Blood Loss: < 10 mL  Findings: Soft tissue lesion of right posterolateral chest wall between 10th and 11th ribs, approximately 2.4 cm. 18 G core biopsy x 3 via 17 G needle.  Venetia Night. Kathlene Cote, M.D Pager:  [phone]

## 2018-02-08 NOTE — Discharge Instructions (Addendum)

## 2018-02-09 DIAGNOSIS — E119 Type 2 diabetes mellitus without complications: Secondary | ICD-10-CM | POA: Diagnosis not present

## 2018-02-09 DIAGNOSIS — N186 End stage renal disease: Secondary | ICD-10-CM | POA: Diagnosis not present

## 2018-02-09 DIAGNOSIS — D631 Anemia in chronic kidney disease: Secondary | ICD-10-CM | POA: Diagnosis not present

## 2018-02-09 DIAGNOSIS — K7689 Other specified diseases of liver: Secondary | ICD-10-CM | POA: Diagnosis not present

## 2018-02-09 DIAGNOSIS — N2581 Secondary hyperparathyroidism of renal origin: Secondary | ICD-10-CM | POA: Diagnosis not present

## 2018-02-09 DIAGNOSIS — D509 Iron deficiency anemia, unspecified: Secondary | ICD-10-CM | POA: Diagnosis not present

## 2018-02-12 ENCOUNTER — Telehealth: Payer: Self-pay

## 2018-02-12 DIAGNOSIS — N186 End stage renal disease: Secondary | ICD-10-CM | POA: Diagnosis not present

## 2018-02-12 DIAGNOSIS — K7689 Other specified diseases of liver: Secondary | ICD-10-CM | POA: Diagnosis not present

## 2018-02-12 DIAGNOSIS — D631 Anemia in chronic kidney disease: Secondary | ICD-10-CM | POA: Diagnosis not present

## 2018-02-12 DIAGNOSIS — E119 Type 2 diabetes mellitus without complications: Secondary | ICD-10-CM | POA: Diagnosis not present

## 2018-02-12 DIAGNOSIS — N2581 Secondary hyperparathyroidism of renal origin: Secondary | ICD-10-CM | POA: Diagnosis not present

## 2018-02-12 DIAGNOSIS — D509 Iron deficiency anemia, unspecified: Secondary | ICD-10-CM | POA: Diagnosis not present

## 2018-02-12 NOTE — Telephone Encounter (Signed)
-----   Message from Melrose Nakayama, MD sent at 02/12/2018  1:36 PM EST ----- I will call  ----- Message ----- From: Donnella Sham, RN Sent: 02/12/2018   1:27 PM EST To: Melrose Nakayama, MD  Patient's wife, Velva Harman [phone], contacted the office wishing to have his biopsy results over the phone instead of at his scheduled appointment tomorrow at 4:30p.  She is afraid with bad weather potential, they may not make it to that visit.  Please advise.  Thanks,  Caryl Pina

## 2018-02-13 ENCOUNTER — Ambulatory Visit: Payer: Medicare Other | Admitting: Thoracic Surgery (Cardiothoracic Vascular Surgery)

## 2018-02-14 ENCOUNTER — Telehealth: Payer: Self-pay | Admitting: Thoracic Surgery (Cardiothoracic Vascular Surgery)

## 2018-02-14 ENCOUNTER — Telehealth: Payer: Self-pay

## 2018-02-14 DIAGNOSIS — K7689 Other specified diseases of liver: Secondary | ICD-10-CM | POA: Diagnosis not present

## 2018-02-14 DIAGNOSIS — N186 End stage renal disease: Secondary | ICD-10-CM | POA: Diagnosis not present

## 2018-02-14 DIAGNOSIS — D509 Iron deficiency anemia, unspecified: Secondary | ICD-10-CM | POA: Diagnosis not present

## 2018-02-14 DIAGNOSIS — N2581 Secondary hyperparathyroidism of renal origin: Secondary | ICD-10-CM | POA: Diagnosis not present

## 2018-02-14 DIAGNOSIS — D631 Anemia in chronic kidney disease: Secondary | ICD-10-CM | POA: Diagnosis not present

## 2018-02-14 DIAGNOSIS — E119 Type 2 diabetes mellitus without complications: Secondary | ICD-10-CM | POA: Diagnosis not present

## 2018-02-14 NOTE — Telephone Encounter (Signed)
-----   Message from Melrose Nakayama, MD sent at 02/14/2018  2:43 PM EST ----- That is ok  [facility] ----- Message ----- From: Donnella Sham, RN Sent: 02/14/2018   1:57 PM EST To: Melrose Nakayama, MD  You do not have any openings for the next 2 weeks.  I added him to the schedule on 02/20/18 @ 9 am.  Is this ok? Or do I need to push him further out?   Thanks,  Caryl Pina  ----- Message ----- From: Melrose Nakayama, MD Sent: 02/14/2018  12:38 PM EST To: Donnella Sham, RN  Called them  We need to get him into office in next week or two to discuss resection  [facility] ----- Message ----- From: Donnella Sham, RN Sent: 02/12/2018   1:27 PM EST To: Melrose Nakayama, MD  Patient's wife, Velva Harman [phone], contacted the office wishing to have his biopsy results over the phone instead of at his scheduled appointment tomorrow at 4:30p.  She is afraid with bad weather potential, they may not make it to that visit.  Please advise.  Thanks,  Caryl Pina

## 2018-02-14 NOTE — Telephone Encounter (Signed)
Patient called and spoke with his wife regarding a follow-up appointment.  She acknowledged receipt.

## 2018-02-14 NOTE — Telephone Encounter (Signed)
Called to update re: biopsy results Path showed metastatic renal cell carcinoma Needs a followup discuss possible CW resection  Remo Lipps C. Roxan Hockey, MD [facility] [phone]

## 2018-02-14 NOTE — Progress Notes (Signed)
Cardiology Office Note   Date:  02/15/2018   ID:  [name], DOB May 03, 1950, MRN [id number]  PCP:  [name], MD  Cardiologist:   [name], MD   Chief Complaint  Patient presents with  . Pre-op Exam      History of Present Illness: [name] is a 67 y.o. male who presents for the patient has a history of coronary artery disease with diffuse diabetic disease status post bypass.  He had a Myoview in 2013 which was abnormal. Cardiac cath demonstrated significant disease and he underwent CABG with a LIMA to the LAD and SVG to OM. He had right nephrectomy in October 2016 for recurrent renal cancer. He's on the renal transplant list and had a Lexis scan Myoview at [facility] in February 2017. He did have surgery ultimately because of infection. He came back in October for preoperative clearance again and saw Kerin Ransom.  He again did not get the transplant and got his kidney removed.  He subsequently had resection of a retroperitoneal mass that was found to be clear cell.   Echo in Jan 2018 during that hospitalization was normal EF with poor windows.  I last saw him because he had bradycardia.  He wore a monitor and was noted to have HR of 29 episodically with episodic 2:1 block and sinus brady.  However, because of his severe comorbid conditions including his ESRD it was suggested that he was high risk for pacing with infection in particular.  He is now going to have a chest wall resection for a mass thought to be renal cell cancer metastasis.    He returns for follow-up with his wife to discuss possible surgical risk.  They are not sure through with this.  There is going to be multispecialty conference to discuss risk benefits.  He remains very active.  He walked in here with his walker but he moves slowly.  He tolerates dialysis.  He is not describing any new shortness of breath, PND or orthopnea.  He is not describing any new palpitations, presyncope or syncope.  He does have low  blood pressures and takes his Midrin but has not had any presyncope or syncope.  Past Medical History:  Diagnosis Date  . Anemia   . Arthritis    "back, neck" (07/28/2014)  . Blind    both eyes removed   . Bruises easily   . CAD, NATIVE VESSEL 01/08/2008      . Cellulitis late 1980's   "hospitalized; wrapped both legs; several times; no OR for this"  . Colon polyps   . Diabetic neuropathy ([place])   . Dialysis patient ([place])   . DM type 2 (diabetes mellitus, type 2) ([place])    no medications (07/28/2014) diet and excersie controlled not onmeds for 14-15 years   . Dysrhythmia    afib   . ESRD (end stage renal disease) on dialysis [facility])    [name]; Mon, Wed, Fri (07/28/2014)  . Fall    hx of fall and required left hip surgery   . Family history of breast cancer    mother  . Heart murmur    hx of  . History of blood product transfusion   . HYPERLIPIDEMIA-MIXED 01/08/2008  . HYPERTENSION 01/27/2009   BP low , hx of HTN, Currently on meds to raise BP  . Hypotension   . Kidney stones   . Low blood pressure   . OVERWEIGHT/OBESITY 01/08/2008   Lost 205  lbs through diet and exercise.    . Peripheral vascular disease ([place])    vein stripping  . Pneumonia 2000;s X 1  . Prostate cancer ([place])   . Prosthetic eye globe    both eyes  . Renal cell carcinoma 2001 and 2003   "both kidneys"  . Renal insufficiency     Past Surgical History:  Procedure Laterality Date  . AV FISTULA PLACEMENT Left 02/2010   LFA  . AV FISTULA PLACEMENT Left 01/12/2016   Procedure: LEFT BRACHIOCEPHALIC ARTERIOVENOUS (AV) FISTULA CREATION;  Surgeon: Angelia Mould, MD;  Location: [place];  Service: Vascular;  Laterality: Left;  . CHOLECYSTECTOMY OPEN  1992  . COLONOSCOPY  06/14/2011   Procedure: COLONOSCOPY;  Surgeon: [name], MD;  [facility];  Service: Endoscopy;  Laterality: N/A;  . COLONOSCOPY N/A 07/24/2012   Procedure: COLONOSCOPY;  Surgeon: [name], MD;  [facility];  Service: Endoscopy;  Laterality: N/A;  . COLONOSCOPY    . COLONOSCOPY N/A 11/04/2015   Procedure: COLONOSCOPY;  Surgeon: Manus Gunning, MD;  Location: [facility] ENDOSCOPY;  Service: Gastroenterology;  Laterality: N/A;  . COLONOSCOPY WITH PROPOFOL N/A 05/13/2014   Procedure: COLONOSCOPY WITH PROPOFOL;  Surgeon: [name], MD;  [facility];  Service: Endoscopy;  Laterality: N/A;  . CORONARY ARTERY BYPASS GRAFT  09/29/2011   Procedure: CORONARY ARTERY BYPASS GRAFTING (CABG);  Surgeon: [name], MD;  Location: [place];  Service: Open Heart Surgery;  Laterality: N/A;  Coronary Artery Bypass Graft times two utilizing the left internal mammary artery and the right greater saphenous vein harvested endoscopically.  . CYSTOSCOPY WITH RETROGRADE PYELOGRAM, URETEROSCOPY AND STENT PLACEMENT Left 07/28/2014   Procedure: CYSTOSCOPY WITH RETROGRADE PYELOGRAM, URETERAL BALLOON DILITATION, URETEROSCOPY AND LEFT STENT PLACEMENT;  Surgeon: [name], MD;  [facility];  Service: Urology;  Laterality: Left;  . ENUCLEATION  2003; 2006   bilateral; "diabetes; pain"  . FOOT AMPUTATION Right ~ 2002   right;  partial; "infection"  . FRACTURE SURGERY     hip fx  . HIP PINNING,CANNULATED Left 12/31/2013   Procedure: CANNULATED HIP PINNING;  Surgeon: Renette Butters, MD;  Location: [place];  Service: Orthopedics;  Laterality: Left;  Carm, FX Table, Stryker  . HOT HEMOSTASIS  06/14/2011   Procedure: HOT HEMOSTASIS (ARGON PLASMA COAGULATION/BICAP);  Surgeon: [name], MD;  [facility];  Service: Endoscopy;  Laterality: N/A;  . INSERTION OF DIALYSIS CATHETER Right 01/12/2016   Procedure: INSERTION OF DIALYSIS CATHETER;  Surgeon: Angelia Mould, MD;  Location: [place];  Service: Vascular;  Laterality: Right;  . INSERTION PROSTATE RADIATION SEED  03/2012  . IR GENERIC HISTORICAL  01/13/2016   IR RADIOLOGIST EVAL & MGMT 01/13/2016 Aletta Edouard, MD [facility] INTERV RAD  . IR  RADIOLOGIST EVAL & MGMT  03/02/2017  . IR RADIOLOGIST EVAL & MGMT  05/18/2017  . IR RADIOLOGIST EVAL & MGMT  07/27/2017  . LAPAROTOMY N/A 04/07/2016   Procedure: EXPLORATORY LAPAROTOMY AND RESECTION OF RETROPERITONEAL MASS;  Surgeon: Raynelle Bring, MD;  [facility];  Service: Urology;  Laterality: N/A;  . LEFT HEART CATHETERIZATION WITH CORONARY ANGIOGRAM N/A 09/27/2011   Procedure: LEFT HEART CATHETERIZATION WITH CORONARY ANGIOGRAM;  Surgeon: [name], MD;  Location: [facility] CATH LAB;  Service: Cardiovascular;  Laterality: N/A;  . LIGATION OF ARTERIOVENOUS  FISTULA Left 01/12/2016   Procedure: LIGATION OF LEFT RADIOCEPHALIC ARTERIOVENOUS  FISTULA;  Surgeon: Angelia Mould, MD;  [facility];  Service: Vascular;  Laterality: Left;  . LIGATION OF COMPETING BRANCHES OF ARTERIOVENOUS FISTULA Left 07/29/2014   Procedure: LIGATION OF COMPETING BRANCHES OF LEFT ARM ARTERIOVENOUS FISTULA;  Surgeon: [name], MD;  Location: [place];  Service: Vascular;  Laterality: Left;  . NEPHRECTOMY Right 01/30/2015   done at [place]  . PARTIAL COLECTOMY Right 04/07/2016   Procedure: RIGHT COLECTOMY AND PARTIAL LIVER RESECTION;  Surgeon: Raynelle Bring, MD;  [facility];  Service: Urology;  Laterality: Right;  . RADIOFREQUENCY ABLATION N/A 04/19/2017   Procedure: CT MICROWAVE THERMAL ABLATION;  Surgeon: Aletta Edouard, MD;  [facility];  Service: Anesthesiology;  Laterality: N/A;  . RADIOFREQUENCY ABLATION KIDNEY  2010-2012   "twice; one on each side; for cancer"  . REVISON OF ARTERIOVENOUS FISTULA Left 09/01/2015   Procedure: EXPLORATION LEFT LOWER ARM  RADIOCEPHALIC ARTERIOVENOUS FISTULA;  Surgeon: Conrad Hamburg, MD;  Location: [place];  Service: Vascular;  Laterality: Left;  [name] VARICOSE VEIN SURGERY  mid 1980's    BLE; "knees down; both legs; 2 separate times"     Current Outpatient Medications  Medication Sig Dispense Refill  . aspirin EC 81 MG tablet Take 81 mg by mouth at bedtime.     [name] atorvastatin (LIPITOR) 10 MG tablet Take 10 mg by mouth daily.    [name] dextromethorphan-guaiFENesin (MUCINEX DM) 30-600 MG 12hr tablet Take 1 tablet by mouth 2 (two) times daily.    . Methoxy PEG-Epoetin Beta (MIRCERA) 50 MCG/0.3ML SOSY Inject 225 mcg as directed every 14 (fourteen) days.     . midodrine (PROAMATINE) 10 MG tablet Take 1 tablet (10 mg total) by mouth 3 (three) times daily with meals. 90 tablet 0  . multivitamin (RENA-VIT) TABS tablet Take 1 tablet by mouth daily.    . Probiotic Product (ALIGN) 4 MG CAPS Take 1 capsule by mouth daily.    . sevelamer carbonate (RENVELA) 800 MG tablet Take 2,400-4,800 mg by mouth See admin instructions. Take 6 tablets (4800 mg) by mouth 3 times daily with meals and 3 tablets (2400 mg) 2 times daily with snacks     No current facility-administered medications for this visit.     Allergies:   Codeine and Tape     ROS:  Please see the history of present illness.   Otherwise, review of systems are positive for none.  All other systems are reviewed and negative.    PHYSICAL EXAM: VS:  BP (!) 158/64   Pulse (!) 57   Ht 6\' 4"  (1.93 m)   Wt 202 lb (91.6 kg)   BMI 24.59 kg/m  , BMI Body mass index is 24.59 kg/m.  GENERAL:  Frail appearing NECK:  No jugular venous distention, waveform within normal limits, carotid upstroke brisk and symmetric, no bruits, no thyromegaly LUNGS:  Clear to auscultation bilaterally CHEST:  Unremarkable HEART:  PMI not displaced or sustained,S1 and S2 within normal limits, no S3, no S4, no clicks, no rubs, no murmurs ABD:  Flat, positive bowel sounds normal in frequency in pitch, no bruits, no rebound, no guarding, no midline pulsatile mass, no hepatomegaly, no splenomegaly EXT:  2 plus pulses throughout, no edema, no cyanosis no clubbing, status post amputation of his right toes  Recent Labs: 04/19/2017: BUN 95; Creatinine, Ser 7.37; Potassium 3.8; Sodium 142 02/08/2018: Hemoglobin 11.9; Platelets 88      Wt  Readings from Last 3 Encounters:  02/15/18 202 lb (91.6 kg)  01/30/18 198 lb (89.8 kg)  07/27/17 198 lb (89.8 kg)    EKG: Atrial fibrillation, left axis deviation, left anterior fascicular block, probable junctional escape rhythm, rate 57.  Other studies Reviewed: Additional studies/ records that were reviewed today include: Surgical notes Review of the above records demonstrates:     ASSESSMENT AND PLAN:   Preop The patient does not have prohibitive risk from a standpoint of unstable symptoms or untreated coronary disease.  He had a negative perfusion study in 2017.  He does have bradycardia arrhythmia which could be a complicating factor perioperatively or postoperatively but would need to be managed episodically.  Overall his risk is high because of his debility and chronic comorbidities that are noncardiac.  No further cardiac testing would alter this.  This needs to be factor again in ongoing discussions about the risk benefits.  I had this discussion with the patient and his wife.  Bradycardia He is high risk for a pacemaker.  No change in therapy  Hx of CABG-2013 He had a low risk perfusion study in 2017 at [facility] had no further work-up.   End stage renal failure on dialysis [facility]) He tolerates dialysis.  Hypotension We talked about appropriate dosing of his midodrine.   Atrial fib  EKG today looks like atrial fibrillation.  I looked him up change the speed and gain and I do not see any P waves.  I talked with the patient about risk benefits of anticoagulation and should reconsider this in the future once we understand whether he is having surgery and I also asked him to inquire about the risk of bleeding should he be on warfarin.  I will see him back in a few months to make sure we further discuss this.   Current medicines are reviewed at length with the patient today.  The patient does not have concerns regarding medicines.  The following changes have been made:   None  Labs/ tests ordered today include: None  Orders Placed This Encounter  Procedures  . EKG 12-Lead     Disposition:   FU with me in 2 months.    Signed, [name], MD  02/15/2018 10:42 AM    [facility]

## 2018-02-15 ENCOUNTER — Encounter: Payer: Self-pay | Admitting: Cardiology

## 2018-02-15 ENCOUNTER — Ambulatory Visit (INDEPENDENT_AMBULATORY_CARE_PROVIDER_SITE_OTHER): Payer: Medicare Other | Admitting: Cardiology

## 2018-02-15 ENCOUNTER — Ambulatory Visit: Payer: Medicare Other | Admitting: Thoracic Surgery (Cardiothoracic Vascular Surgery)

## 2018-02-15 VITALS — BP 158/64 | HR 57 | Ht 76.0 in | Wt 202.0 lb

## 2018-02-15 DIAGNOSIS — I959 Hypotension, unspecified: Secondary | ICD-10-CM | POA: Diagnosis not present

## 2018-02-15 DIAGNOSIS — R001 Bradycardia, unspecified: Secondary | ICD-10-CM | POA: Diagnosis not present

## 2018-02-15 DIAGNOSIS — Z01810 Encounter for preprocedural cardiovascular examination: Secondary | ICD-10-CM

## 2018-02-15 DIAGNOSIS — I4819 Other persistent atrial fibrillation: Secondary | ICD-10-CM | POA: Diagnosis not present

## 2018-02-15 DIAGNOSIS — I2510 Atherosclerotic heart disease of native coronary artery without angina pectoris: Secondary | ICD-10-CM | POA: Diagnosis not present

## 2018-02-15 NOTE — Patient Instructions (Signed)
Medication Instructions:  Continue current medications  If you need a refill on your cardiac medications before your next appointment, please call your pharmacy.  Labwork: None ordered   If you have labs (blood work) drawn today and your tests are completely normal, you will receive your results only by: [name] MyChart Message (if you have MyChart) OR . A paper copy in the mail If you have any lab test that is abnormal or we need to change your treatment, we will call you to review the results.  Testing/Procedures: None Ordered  Follow-Up: You will need a follow up appointment in 2 Months.    At [facility], you and your health needs are our priority.  As part of our continuing mission to provide you with exceptional heart care, we have created designated Provider Care Teams.  These Care Teams include your primary Cardiologist (physician) and Advanced Practice Providers (APPs -  Physician Assistants and Nurse Practitioners) who all work together to provide you with the care you need, when you need it.   Thank you for choosing [facility] at [facility]!!

## 2018-02-16 DIAGNOSIS — N2581 Secondary hyperparathyroidism of renal origin: Secondary | ICD-10-CM | POA: Diagnosis not present

## 2018-02-16 DIAGNOSIS — E119 Type 2 diabetes mellitus without complications: Secondary | ICD-10-CM | POA: Diagnosis not present

## 2018-02-16 DIAGNOSIS — D509 Iron deficiency anemia, unspecified: Secondary | ICD-10-CM | POA: Diagnosis not present

## 2018-02-16 DIAGNOSIS — D631 Anemia in chronic kidney disease: Secondary | ICD-10-CM | POA: Diagnosis not present

## 2018-02-16 DIAGNOSIS — K7689 Other specified diseases of liver: Secondary | ICD-10-CM | POA: Diagnosis not present

## 2018-02-16 DIAGNOSIS — N186 End stage renal disease: Secondary | ICD-10-CM | POA: Diagnosis not present

## 2018-02-19 DIAGNOSIS — N2581 Secondary hyperparathyroidism of renal origin: Secondary | ICD-10-CM | POA: Diagnosis not present

## 2018-02-19 DIAGNOSIS — N186 End stage renal disease: Secondary | ICD-10-CM | POA: Diagnosis not present

## 2018-02-19 DIAGNOSIS — E119 Type 2 diabetes mellitus without complications: Secondary | ICD-10-CM | POA: Diagnosis not present

## 2018-02-19 DIAGNOSIS — D509 Iron deficiency anemia, unspecified: Secondary | ICD-10-CM | POA: Diagnosis not present

## 2018-02-19 DIAGNOSIS — K7689 Other specified diseases of liver: Secondary | ICD-10-CM | POA: Diagnosis not present

## 2018-02-19 DIAGNOSIS — D631 Anemia in chronic kidney disease: Secondary | ICD-10-CM | POA: Diagnosis not present

## 2018-02-20 ENCOUNTER — Other Ambulatory Visit: Payer: Self-pay

## 2018-02-20 ENCOUNTER — Encounter: Payer: Self-pay | Admitting: Thoracic Surgery (Cardiothoracic Vascular Surgery)

## 2018-02-20 ENCOUNTER — Ambulatory Visit (INDEPENDENT_AMBULATORY_CARE_PROVIDER_SITE_OTHER): Payer: Medicare Other | Admitting: Thoracic Surgery (Cardiothoracic Vascular Surgery)

## 2018-02-20 VITALS — BP 119/65 | HR 56 | Resp 16 | Ht 76.0 in | Wt 202.0 lb

## 2018-02-20 DIAGNOSIS — R222 Localized swelling, mass and lump, trunk: Secondary | ICD-10-CM

## 2018-02-20 DIAGNOSIS — I2510 Atherosclerotic heart disease of native coronary artery without angina pectoris: Secondary | ICD-10-CM | POA: Diagnosis not present

## 2018-02-20 DIAGNOSIS — C641 Malignant neoplasm of right kidney, except renal pelvis: Secondary | ICD-10-CM

## 2018-02-20 NOTE — Progress Notes (Signed)
Nicholas [address]       [name],[name]             [id number]    HPI: Mr. [name] returns to discuss management of his chest wall mass  [name] is a 67 year old man with a history of coronary disease, status post coronary bypass grafting in 2013, hypertension, hyperlipidemia, type 2 diabetes, bilateral renal cell carcinoma and end-stage renal disease.  He had a right nephrectomy in [place] in 2016.  That was followed by partial colectomy and liver resection for residual tumor in 2018.  He later had radiofrequency ablation of the tumor bed for residual tumor in January 2019.  On PET CT in April there were 2 areas in the chest wall around the ninth and 10th and 11th ribs that were mildly hypermetabolic.  On a recent CT scan 1 of the lesions had increased in size.  The needle biopsy showed metastatic clear cell carcinoma.  Past Medical History:  Diagnosis Date  . Anemia   . Arthritis    "back, neck" (07/28/2014)  . Blind    both eyes removed   . Bruises easily   . CAD, NATIVE VESSEL 01/08/2008      . Cellulitis late 1980's   "hospitalized; wrapped both legs; several times; no OR for this"  . Colon polyps   . Diabetic neuropathy ([place])   . Dialysis patient ([place])   . DM type 2 (diabetes mellitus, type 2) ([place])    no medications (07/28/2014) diet and excersie controlled not onmeds for 14-15 years   . Dysrhythmia    afib   . ESRD (end stage renal disease) on dialysis [facility])    [name]; Mon, Wed, Fri (07/28/2014)  . Fall    hx of fall and required left hip surgery   . Family history of breast cancer    mother  . Heart murmur    hx of  . History of blood product transfusion   . HYPERLIPIDEMIA-MIXED 01/08/2008  . HYPERTENSION 01/27/2009   BP low , hx of HTN, Currently on meds to raise BP  . Hypotension   . Kidney stones   . Low blood pressure   . OVERWEIGHT/OBESITY 01/08/2008   Lost 205 lbs through diet and exercise.    . Peripheral vascular disease  ([place])    vein stripping  . Pneumonia 2000;s X 1  . Prostate cancer ([place])   . Prosthetic eye globe    both eyes  . Renal cell carcinoma 2001 and 2003   "both kidneys"  . Renal insufficiency    Past Surgical History:  Procedure Laterality Date  . AV FISTULA PLACEMENT Left 02/2010   LFA  . AV FISTULA PLACEMENT Left 01/12/2016   Procedure: LEFT BRACHIOCEPHALIC ARTERIOVENOUS (AV) FISTULA CREATION;  Surgeon: Angelia Mould, MD;  Location: [place];  Service: Vascular;  Laterality: Left;  . CHOLECYSTECTOMY OPEN  1992  . COLONOSCOPY  06/14/2011   Procedure: COLONOSCOPY;  Surgeon: [name], MD;  [facility];  Service: Endoscopy;  Laterality: N/A;  . COLONOSCOPY N/A 07/24/2012   Procedure: COLONOSCOPY;  Surgeon: [name], MD;  [facility];  Service: Endoscopy;  Laterality: N/A;  . COLONOSCOPY    . COLONOSCOPY N/A 11/04/2015   Procedure: COLONOSCOPY;  Surgeon: Manus Gunning, MD;  Location: [facility] ENDOSCOPY;  Service: Gastroenterology;  Laterality: N/A;  . COLONOSCOPY WITH PROPOFOL N/A 05/13/2014   Procedure: COLONOSCOPY WITH PROPOFOL;  Surgeon: Herbie Baltimore  Shaaron Adler, MD;  [facility];  Service: Endoscopy;  Laterality: N/A;  . CORONARY ARTERY BYPASS GRAFT  09/29/2011   Procedure: CORONARY ARTERY BYPASS GRAFTING (CABG);  Surgeon: [name], MD;  Location: [place];  Service: Open Heart Surgery;  Laterality: N/A;  Coronary Artery Bypass Graft times two utilizing the left internal mammary artery and the right greater saphenous vein harvested endoscopically.  . CYSTOSCOPY WITH RETROGRADE PYELOGRAM, URETEROSCOPY AND STENT PLACEMENT Left 07/28/2014   Procedure: CYSTOSCOPY WITH RETROGRADE PYELOGRAM, URETERAL BALLOON DILITATION, URETEROSCOPY AND LEFT STENT PLACEMENT;  Surgeon: [name], MD;  [facility];  Service: Urology;  Laterality: Left;  . ENUCLEATION  2003; 2006   bilateral; "diabetes; pain"  . FOOT AMPUTATION Right ~ 2002   right;  partial;  "infection"  . FRACTURE SURGERY     hip fx  . HIP PINNING,CANNULATED Left 12/31/2013   Procedure: CANNULATED HIP PINNING;  Surgeon: Renette Butters, MD;  Location: [place];  Service: Orthopedics;  Laterality: Left;  Carm, FX Table, Stryker  . HOT HEMOSTASIS  06/14/2011   Procedure: HOT HEMOSTASIS (ARGON PLASMA COAGULATION/BICAP);  Surgeon: [name], MD;  [facility];  Service: Endoscopy;  Laterality: N/A;  . INSERTION OF DIALYSIS CATHETER Right 01/12/2016   Procedure: INSERTION OF DIALYSIS CATHETER;  Surgeon: Angelia Mould, MD;  Location: [place];  Service: Vascular;  Laterality: Right;  . INSERTION PROSTATE RADIATION SEED  03/2012  . IR GENERIC HISTORICAL  01/13/2016   IR RADIOLOGIST EVAL & MGMT 01/13/2016 Aletta Edouard, MD [facility] INTERV RAD  . IR RADIOLOGIST EVAL & MGMT  03/02/2017  . IR RADIOLOGIST EVAL & MGMT  05/18/2017  . IR RADIOLOGIST EVAL & MGMT  07/27/2017  . LAPAROTOMY N/A 04/07/2016   Procedure: EXPLORATORY LAPAROTOMY AND RESECTION OF RETROPERITONEAL MASS;  Surgeon: Raynelle Bring, MD;  [facility];  Service: Urology;  Laterality: N/A;  . LEFT HEART CATHETERIZATION WITH CORONARY ANGIOGRAM N/A 09/27/2011   Procedure: LEFT HEART CATHETERIZATION WITH CORONARY ANGIOGRAM;  Surgeon: [name], MD;  Location: [facility] CATH LAB;  Service: Cardiovascular;  Laterality: N/A;  . LIGATION OF ARTERIOVENOUS  FISTULA Left 01/12/2016   Procedure: LIGATION OF LEFT RADIOCEPHALIC ARTERIOVENOUS  FISTULA;  Surgeon: Angelia Mould, MD;  Location: [name];  Service: Vascular;  Laterality: Left;  . LIGATION OF COMPETING BRANCHES OF ARTERIOVENOUS FISTULA Left 07/29/2014   Procedure: LIGATION OF COMPETING BRANCHES OF LEFT ARM ARTERIOVENOUS FISTULA;  Surgeon: [name], MD;  Location: [place];  Service: Vascular;  Laterality: Left;  . NEPHRECTOMY Right 01/30/2015   done at [place]  . PARTIAL COLECTOMY Right 04/07/2016   Procedure: RIGHT COLECTOMY AND PARTIAL LIVER  RESECTION;  Surgeon: Raynelle Bring, MD;  [facility];  Service: Urology;  Laterality: Right;  . RADIOFREQUENCY ABLATION N/A 04/19/2017   Procedure: CT MICROWAVE THERMAL ABLATION;  Surgeon: Aletta Edouard, MD;  [facility];  Service: Anesthesiology;  Laterality: N/A;  . RADIOFREQUENCY ABLATION KIDNEY  2010-2012   "twice; one on each side; for cancer"  . REVISON OF ARTERIOVENOUS FISTULA Left 09/01/2015   Procedure: EXPLORATION LEFT LOWER ARM  RADIOCEPHALIC ARTERIOVENOUS FISTULA;  Surgeon: Conrad McNab, MD;  Location: [place];  Service: Vascular;  Laterality: Left;  [name] VARICOSE VEIN SURGERY  mid 1980's    BLE; "knees down; both legs; 2 separate times"      Current Outpatient Medications  Medication Sig Dispense Refill  . aspirin EC 81 MG tablet Take 81 mg by mouth  at bedtime.    [name] atorvastatin (LIPITOR) 10 MG tablet Take 10 mg by mouth daily.    [name] dextromethorphan-guaiFENesin (MUCINEX DM) 30-600 MG 12hr tablet Take 1 tablet by mouth 2 (two) times daily.    . Methoxy PEG-Epoetin Beta (MIRCERA) 50 MCG/0.3ML SOSY Inject 225 mcg as directed every 14 (fourteen) days.     . midodrine (PROAMATINE) 10 MG tablet Take 1 tablet (10 mg total) by mouth 3 (three) times daily with meals. 90 tablet 0  . multivitamin (RENA-VIT) TABS tablet Take 1 tablet by mouth daily.    . Probiotic Product (ALIGN) 4 MG CAPS Take 1 capsule by mouth daily.    . sevelamer carbonate (RENVELA) 800 MG tablet Take 2,400-4,800 mg by mouth See admin instructions. Take 6 tablets (4800 mg) by mouth 3 times daily with meals and 3 tablets (2400 mg) 2 times daily with snacks     No current facility-administered medications for this visit.     Physical Exam BP 119/65 (BP Location: Left Arm, Patient Position: Sitting, Cuff Size: Large)   Pulse (!) 56   Resp 16   Ht 6' 4"  (1.93 m)   Wt 202 lb (91.6 kg)   SpO2 98% Comment: RA  BMI 24.38 kg/m  67 year old man in no acute distress Alert and oriented, blind Cardiac regular rate  and rhythm Lungs clear with equal breath sounds bilaterally  Diagnostic Tests: Pathology showed metastatic clear cell carcinoma  Impression: Mr. Nedved is a 67 year old gentleman with numerous medical problems.  He has had renal cell carcinoma.  He had a right nephrectomy in 2016.  He developed a recurrence in the tumor bed and underwent resection for that in 2018.  He then had additional recurrence which was treated with radiofrequency ablation.  He now has a another recurrence in his chest wall.  I suspect that this is due to tumor tracking at the time of a previous biopsy or the radiofrequency ablation.  It is actually between the ribs not involving the ribs and I think that would be a very unusual location for hematogenous met.  With no other disease evident make it would be reasonable to treat this locally.  I think the best option for chance for cure would be surgical resection.  This would be a significant operation as we would have to resect parts of the 10th and 11th ribs and possibly part of the ninth as well.  We would have to reconstruct this area.  There is no guarantee of a cure.  Other potential options would be stereotactic radiation or radiofrequency ablation.  I do not think that either 1 of those has is get a chance for cure but obviously there is less associated risk.  Mr. Oriley's primary concern regarding surgery is the amount of pain.  That is difficult to predict but I do think it would be significant.  Of course given his cardiac history he is high risk from a cardiac perspective as well.  They are waiting for additional word from Dr. Alinda Money before deciding how to proceed.  If he decides to proceed with a chest wall resection we will be happy to schedule that.  Plan: Right chest wall resection (10th and 11th) ribs with reconstruction.  Patient will call to schedule if he decides to proceed  Melrose Nakayama, MD [facility] [phone]

## 2018-02-21 DIAGNOSIS — D631 Anemia in chronic kidney disease: Secondary | ICD-10-CM | POA: Diagnosis not present

## 2018-02-21 DIAGNOSIS — E119 Type 2 diabetes mellitus without complications: Secondary | ICD-10-CM | POA: Diagnosis not present

## 2018-02-21 DIAGNOSIS — K7689 Other specified diseases of liver: Secondary | ICD-10-CM | POA: Diagnosis not present

## 2018-02-21 DIAGNOSIS — N2581 Secondary hyperparathyroidism of renal origin: Secondary | ICD-10-CM | POA: Diagnosis not present

## 2018-02-21 DIAGNOSIS — N186 End stage renal disease: Secondary | ICD-10-CM | POA: Diagnosis not present

## 2018-02-21 DIAGNOSIS — D509 Iron deficiency anemia, unspecified: Secondary | ICD-10-CM | POA: Diagnosis not present

## 2018-02-23 ENCOUNTER — Encounter: Payer: Self-pay | Admitting: Thoracic Surgery (Cardiothoracic Vascular Surgery)

## 2018-02-23 DIAGNOSIS — D509 Iron deficiency anemia, unspecified: Secondary | ICD-10-CM | POA: Diagnosis not present

## 2018-02-23 DIAGNOSIS — N186 End stage renal disease: Secondary | ICD-10-CM | POA: Diagnosis not present

## 2018-02-23 DIAGNOSIS — K7689 Other specified diseases of liver: Secondary | ICD-10-CM | POA: Diagnosis not present

## 2018-02-23 DIAGNOSIS — E119 Type 2 diabetes mellitus without complications: Secondary | ICD-10-CM | POA: Diagnosis not present

## 2018-02-23 DIAGNOSIS — D631 Anemia in chronic kidney disease: Secondary | ICD-10-CM | POA: Diagnosis not present

## 2018-02-23 DIAGNOSIS — N2581 Secondary hyperparathyroidism of renal origin: Secondary | ICD-10-CM | POA: Diagnosis not present

## 2018-02-25 DIAGNOSIS — N186 End stage renal disease: Secondary | ICD-10-CM | POA: Diagnosis not present

## 2018-02-25 DIAGNOSIS — E119 Type 2 diabetes mellitus without complications: Secondary | ICD-10-CM | POA: Diagnosis not present

## 2018-02-25 DIAGNOSIS — D631 Anemia in chronic kidney disease: Secondary | ICD-10-CM | POA: Diagnosis not present

## 2018-02-25 DIAGNOSIS — D509 Iron deficiency anemia, unspecified: Secondary | ICD-10-CM | POA: Diagnosis not present

## 2018-02-25 DIAGNOSIS — K7689 Other specified diseases of liver: Secondary | ICD-10-CM | POA: Diagnosis not present

## 2018-02-25 DIAGNOSIS — N2581 Secondary hyperparathyroidism of renal origin: Secondary | ICD-10-CM | POA: Diagnosis not present

## 2018-02-27 DIAGNOSIS — N186 End stage renal disease: Secondary | ICD-10-CM | POA: Diagnosis not present

## 2018-02-27 DIAGNOSIS — N2581 Secondary hyperparathyroidism of renal origin: Secondary | ICD-10-CM | POA: Diagnosis not present

## 2018-02-27 DIAGNOSIS — E119 Type 2 diabetes mellitus without complications: Secondary | ICD-10-CM | POA: Diagnosis not present

## 2018-02-27 DIAGNOSIS — D509 Iron deficiency anemia, unspecified: Secondary | ICD-10-CM | POA: Diagnosis not present

## 2018-02-27 DIAGNOSIS — D631 Anemia in chronic kidney disease: Secondary | ICD-10-CM | POA: Diagnosis not present

## 2018-02-27 DIAGNOSIS — K7689 Other specified diseases of liver: Secondary | ICD-10-CM | POA: Diagnosis not present

## 2018-03-02 DIAGNOSIS — N186 End stage renal disease: Secondary | ICD-10-CM | POA: Diagnosis not present

## 2018-03-02 DIAGNOSIS — D631 Anemia in chronic kidney disease: Secondary | ICD-10-CM | POA: Diagnosis not present

## 2018-03-02 DIAGNOSIS — K7689 Other specified diseases of liver: Secondary | ICD-10-CM | POA: Diagnosis not present

## 2018-03-02 DIAGNOSIS — N2581 Secondary hyperparathyroidism of renal origin: Secondary | ICD-10-CM | POA: Diagnosis not present

## 2018-03-02 DIAGNOSIS — E119 Type 2 diabetes mellitus without complications: Secondary | ICD-10-CM | POA: Diagnosis not present

## 2018-03-02 DIAGNOSIS — D509 Iron deficiency anemia, unspecified: Secondary | ICD-10-CM | POA: Diagnosis not present

## 2018-03-04 DIAGNOSIS — E1129 Type 2 diabetes mellitus with other diabetic kidney complication: Secondary | ICD-10-CM | POA: Diagnosis not present

## 2018-03-04 DIAGNOSIS — Z992 Dependence on renal dialysis: Secondary | ICD-10-CM | POA: Diagnosis not present

## 2018-03-04 DIAGNOSIS — N186 End stage renal disease: Secondary | ICD-10-CM | POA: Diagnosis not present

## 2018-03-05 DIAGNOSIS — N2581 Secondary hyperparathyroidism of renal origin: Secondary | ICD-10-CM | POA: Diagnosis not present

## 2018-03-05 DIAGNOSIS — D631 Anemia in chronic kidney disease: Secondary | ICD-10-CM | POA: Diagnosis not present

## 2018-03-05 DIAGNOSIS — D509 Iron deficiency anemia, unspecified: Secondary | ICD-10-CM | POA: Diagnosis not present

## 2018-03-05 DIAGNOSIS — N186 End stage renal disease: Secondary | ICD-10-CM | POA: Diagnosis not present

## 2018-03-05 DIAGNOSIS — E119 Type 2 diabetes mellitus without complications: Secondary | ICD-10-CM | POA: Diagnosis not present

## 2018-03-05 DIAGNOSIS — K7689 Other specified diseases of liver: Secondary | ICD-10-CM | POA: Diagnosis not present

## 2018-03-07 DIAGNOSIS — N2581 Secondary hyperparathyroidism of renal origin: Secondary | ICD-10-CM | POA: Diagnosis not present

## 2018-03-07 DIAGNOSIS — E119 Type 2 diabetes mellitus without complications: Secondary | ICD-10-CM | POA: Diagnosis not present

## 2018-03-07 DIAGNOSIS — N186 End stage renal disease: Secondary | ICD-10-CM | POA: Diagnosis not present

## 2018-03-07 DIAGNOSIS — D509 Iron deficiency anemia, unspecified: Secondary | ICD-10-CM | POA: Diagnosis not present

## 2018-03-07 DIAGNOSIS — K7689 Other specified diseases of liver: Secondary | ICD-10-CM | POA: Diagnosis not present

## 2018-03-07 DIAGNOSIS — D631 Anemia in chronic kidney disease: Secondary | ICD-10-CM | POA: Diagnosis not present

## 2018-03-09 DIAGNOSIS — N2581 Secondary hyperparathyroidism of renal origin: Secondary | ICD-10-CM | POA: Diagnosis not present

## 2018-03-09 DIAGNOSIS — D509 Iron deficiency anemia, unspecified: Secondary | ICD-10-CM | POA: Diagnosis not present

## 2018-03-09 DIAGNOSIS — K7689 Other specified diseases of liver: Secondary | ICD-10-CM | POA: Diagnosis not present

## 2018-03-09 DIAGNOSIS — E119 Type 2 diabetes mellitus without complications: Secondary | ICD-10-CM | POA: Diagnosis not present

## 2018-03-09 DIAGNOSIS — D631 Anemia in chronic kidney disease: Secondary | ICD-10-CM | POA: Diagnosis not present

## 2018-03-09 DIAGNOSIS — N186 End stage renal disease: Secondary | ICD-10-CM | POA: Diagnosis not present

## 2018-03-12 DIAGNOSIS — E119 Type 2 diabetes mellitus without complications: Secondary | ICD-10-CM | POA: Diagnosis not present

## 2018-03-12 DIAGNOSIS — N2581 Secondary hyperparathyroidism of renal origin: Secondary | ICD-10-CM | POA: Diagnosis not present

## 2018-03-12 DIAGNOSIS — N186 End stage renal disease: Secondary | ICD-10-CM | POA: Diagnosis not present

## 2018-03-12 DIAGNOSIS — D631 Anemia in chronic kidney disease: Secondary | ICD-10-CM | POA: Diagnosis not present

## 2018-03-12 DIAGNOSIS — K7689 Other specified diseases of liver: Secondary | ICD-10-CM | POA: Diagnosis not present

## 2018-03-12 DIAGNOSIS — D509 Iron deficiency anemia, unspecified: Secondary | ICD-10-CM | POA: Diagnosis not present

## 2018-03-14 DIAGNOSIS — N2581 Secondary hyperparathyroidism of renal origin: Secondary | ICD-10-CM | POA: Diagnosis not present

## 2018-03-14 DIAGNOSIS — K7689 Other specified diseases of liver: Secondary | ICD-10-CM | POA: Diagnosis not present

## 2018-03-14 DIAGNOSIS — D509 Iron deficiency anemia, unspecified: Secondary | ICD-10-CM | POA: Diagnosis not present

## 2018-03-14 DIAGNOSIS — N186 End stage renal disease: Secondary | ICD-10-CM | POA: Diagnosis not present

## 2018-03-14 DIAGNOSIS — E119 Type 2 diabetes mellitus without complications: Secondary | ICD-10-CM | POA: Diagnosis not present

## 2018-03-14 DIAGNOSIS — D631 Anemia in chronic kidney disease: Secondary | ICD-10-CM | POA: Diagnosis not present

## 2018-03-15 ENCOUNTER — Encounter: Payer: Self-pay | Admitting: Genetics

## 2018-03-16 ENCOUNTER — Inpatient Hospital Stay (HOSPITAL_COMMUNITY)
Admission: EM | Admit: 2018-03-16 | Discharge: 2018-03-23 | DRG: 193 | Disposition: A | Discharge status: Home Health | Payer: Medicare Other | Attending: Internal Medicine | Admitting: Internal Medicine

## 2018-03-16 ENCOUNTER — Emergency Department (HOSPITAL_COMMUNITY): Payer: Medicare Other

## 2018-03-16 ENCOUNTER — Other Ambulatory Visit: Payer: Self-pay

## 2018-03-16 ENCOUNTER — Encounter (HOSPITAL_COMMUNITY): Payer: Self-pay | Admitting: Emergency Medicine

## 2018-03-16 DIAGNOSIS — D61818 Other pancytopenia: Secondary | ICD-10-CM | POA: Diagnosis present

## 2018-03-16 DIAGNOSIS — L89153 Pressure ulcer of sacral region, stage 3: Secondary | ICD-10-CM | POA: Diagnosis present

## 2018-03-16 DIAGNOSIS — Z85528 Personal history of other malignant neoplasm of kidney: Secondary | ICD-10-CM

## 2018-03-16 DIAGNOSIS — R001 Bradycardia, unspecified: Secondary | ICD-10-CM | POA: Diagnosis present

## 2018-03-16 DIAGNOSIS — R008 Other abnormalities of heart beat: Secondary | ICD-10-CM | POA: Diagnosis present

## 2018-03-16 DIAGNOSIS — Z888 Allergy status to other drugs, medicaments and biological substances status: Secondary | ICD-10-CM

## 2018-03-16 DIAGNOSIS — J9601 Acute respiratory failure with hypoxia: Secondary | ICD-10-CM | POA: Diagnosis not present

## 2018-03-16 DIAGNOSIS — Z992 Dependence on renal dialysis: Secondary | ICD-10-CM | POA: Diagnosis not present

## 2018-03-16 DIAGNOSIS — E1142 Type 2 diabetes mellitus with diabetic polyneuropathy: Secondary | ICD-10-CM | POA: Diagnosis present

## 2018-03-16 DIAGNOSIS — R5381 Other malaise: Secondary | ICD-10-CM | POA: Diagnosis present

## 2018-03-16 DIAGNOSIS — E119 Type 2 diabetes mellitus without complications: Secondary | ICD-10-CM

## 2018-03-16 DIAGNOSIS — Z970 Presence of artificial eye: Secondary | ICD-10-CM

## 2018-03-16 DIAGNOSIS — M1990 Unspecified osteoarthritis, unspecified site: Secondary | ICD-10-CM | POA: Diagnosis present

## 2018-03-16 DIAGNOSIS — Z8546 Personal history of malignant neoplasm of prostate: Secondary | ICD-10-CM | POA: Diagnosis not present

## 2018-03-16 DIAGNOSIS — D696 Thrombocytopenia, unspecified: Secondary | ICD-10-CM

## 2018-03-16 DIAGNOSIS — J9811 Atelectasis: Secondary | ICD-10-CM | POA: Diagnosis not present

## 2018-03-16 DIAGNOSIS — Z803 Family history of malignant neoplasm of breast: Secondary | ICD-10-CM

## 2018-03-16 DIAGNOSIS — E1151 Type 2 diabetes mellitus with diabetic peripheral angiopathy without gangrene: Secondary | ICD-10-CM | POA: Diagnosis present

## 2018-03-16 DIAGNOSIS — R509 Fever, unspecified: Secondary | ICD-10-CM

## 2018-03-16 DIAGNOSIS — M542 Cervicalgia: Secondary | ICD-10-CM | POA: Diagnosis present

## 2018-03-16 DIAGNOSIS — I951 Orthostatic hypotension: Secondary | ICD-10-CM

## 2018-03-16 DIAGNOSIS — I120 Hypertensive chronic kidney disease with stage 5 chronic kidney disease or end stage renal disease: Secondary | ICD-10-CM | POA: Diagnosis present

## 2018-03-16 DIAGNOSIS — N2581 Secondary hyperparathyroidism of renal origin: Secondary | ICD-10-CM | POA: Diagnosis not present

## 2018-03-16 DIAGNOSIS — I4891 Unspecified atrial fibrillation: Secondary | ICD-10-CM | POA: Diagnosis not present

## 2018-03-16 DIAGNOSIS — I953 Hypotension of hemodialysis: Secondary | ICD-10-CM | POA: Diagnosis present

## 2018-03-16 DIAGNOSIS — E785 Hyperlipidemia, unspecified: Secondary | ICD-10-CM | POA: Diagnosis present

## 2018-03-16 DIAGNOSIS — H543 Unqualified visual loss, both eyes: Secondary | ICD-10-CM | POA: Diagnosis not present

## 2018-03-16 DIAGNOSIS — J189 Pneumonia, unspecified organism: Principal | ICD-10-CM

## 2018-03-16 DIAGNOSIS — R197 Diarrhea, unspecified: Secondary | ICD-10-CM | POA: Diagnosis not present

## 2018-03-16 DIAGNOSIS — R531 Weakness: Secondary | ICD-10-CM

## 2018-03-16 DIAGNOSIS — R296 Repeated falls: Secondary | ICD-10-CM | POA: Diagnosis present

## 2018-03-16 DIAGNOSIS — Z87442 Personal history of urinary calculi: Secondary | ICD-10-CM

## 2018-03-16 DIAGNOSIS — Z885 Allergy status to narcotic agent status: Secondary | ICD-10-CM

## 2018-03-16 DIAGNOSIS — Z79899 Other long term (current) drug therapy: Secondary | ICD-10-CM

## 2018-03-16 DIAGNOSIS — Z9049 Acquired absence of other specified parts of digestive tract: Secondary | ICD-10-CM

## 2018-03-16 DIAGNOSIS — I2510 Atherosclerotic heart disease of native coronary artery without angina pectoris: Secondary | ICD-10-CM | POA: Diagnosis not present

## 2018-03-16 DIAGNOSIS — R000 Tachycardia, unspecified: Secondary | ICD-10-CM | POA: Diagnosis not present

## 2018-03-16 DIAGNOSIS — G8929 Other chronic pain: Secondary | ICD-10-CM | POA: Diagnosis present

## 2018-03-16 DIAGNOSIS — I9589 Other hypotension: Secondary | ICD-10-CM | POA: Diagnosis not present

## 2018-03-16 DIAGNOSIS — I959 Hypotension, unspecified: Secondary | ICD-10-CM | POA: Diagnosis present

## 2018-03-16 DIAGNOSIS — Y95 Nosocomial condition: Secondary | ICD-10-CM | POA: Diagnosis present

## 2018-03-16 DIAGNOSIS — Z833 Family history of diabetes mellitus: Secondary | ICD-10-CM

## 2018-03-16 DIAGNOSIS — Z825 Family history of asthma and other chronic lower respiratory diseases: Secondary | ICD-10-CM

## 2018-03-16 DIAGNOSIS — R9431 Abnormal electrocardiogram [ECG] [EKG]: Secondary | ICD-10-CM | POA: Diagnosis not present

## 2018-03-16 DIAGNOSIS — Z8719 Personal history of other diseases of the digestive system: Secondary | ICD-10-CM

## 2018-03-16 DIAGNOSIS — D649 Anemia, unspecified: Secondary | ICD-10-CM | POA: Diagnosis present

## 2018-03-16 DIAGNOSIS — E1122 Type 2 diabetes mellitus with diabetic chronic kidney disease: Secondary | ICD-10-CM | POA: Diagnosis present

## 2018-03-16 DIAGNOSIS — N186 End stage renal disease: Secondary | ICD-10-CM

## 2018-03-16 DIAGNOSIS — R0902 Hypoxemia: Secondary | ICD-10-CM | POA: Diagnosis not present

## 2018-03-16 DIAGNOSIS — Z8701 Personal history of pneumonia (recurrent): Secondary | ICD-10-CM

## 2018-03-16 DIAGNOSIS — Z951 Presence of aortocoronary bypass graft: Secondary | ICD-10-CM

## 2018-03-16 DIAGNOSIS — Z8349 Family history of other endocrine, nutritional and metabolic diseases: Secondary | ICD-10-CM

## 2018-03-16 DIAGNOSIS — Z8249 Family history of ischemic heart disease and other diseases of the circulatory system: Secondary | ICD-10-CM

## 2018-03-16 DIAGNOSIS — Z7982 Long term (current) use of aspirin: Secondary | ICD-10-CM

## 2018-03-16 DIAGNOSIS — D631 Anemia in chronic kidney disease: Secondary | ICD-10-CM | POA: Diagnosis present

## 2018-03-16 DIAGNOSIS — Z794 Long term (current) use of insulin: Secondary | ICD-10-CM

## 2018-03-16 DIAGNOSIS — L8990 Pressure ulcer of unspecified site, unspecified stage: Secondary | ICD-10-CM

## 2018-03-16 DIAGNOSIS — Z801 Family history of malignant neoplasm of trachea, bronchus and lung: Secondary | ICD-10-CM

## 2018-03-16 DIAGNOSIS — Z89431 Acquired absence of right foot: Secondary | ICD-10-CM

## 2018-03-16 DIAGNOSIS — Z905 Acquired absence of kidney: Secondary | ICD-10-CM

## 2018-03-16 LAB — COMPREHENSIVE METABOLIC PANEL
ALT: 25 U/L (ref 0–44)
AST: 19 U/L (ref 15–41)
Albumin: 3.7 g/dL (ref 3.5–5.0)
Alkaline Phosphatase: 106 U/L (ref 38–126)
Anion gap: 19 — ABNORMAL HIGH (ref 5–15)
BUN: 78 mg/dL — ABNORMAL HIGH (ref 8–23)
CO2: 26 mmol/L (ref 22–32)
Calcium: 8.7 mg/dL — ABNORMAL LOW (ref 8.9–10.3)
Chloride: 99 mmol/L (ref 98–111)
Creatinine, Ser: 7.58 mg/dL — ABNORMAL HIGH (ref 0.61–1.24)
GFR calc Af Amer: 8 mL/min — ABNORMAL LOW (ref >60)
GFR calc non Af Amer: 7 mL/min — ABNORMAL LOW (ref >60)
Glucose, Bld: 144 mg/dL — ABNORMAL HIGH (ref 70–99)
Potassium: 3.7 mmol/L (ref 3.5–5.1)
SODIUM: 144 mmol/L (ref 135–145)
Total Bilirubin: 1.1 mg/dL (ref 0.3–1.2)
Total Protein: 6.1 g/dL — ABNORMAL LOW (ref 6.5–8.1)

## 2018-03-16 LAB — CBC WITH DIFFERENTIAL/PLATELET
Abs Immature Granulocytes: 0 10*3/uL (ref 0.00–0.07)
Band Neutrophils: 15 %
Basophils Absolute: 0 10*3/uL (ref 0.0–0.1)
Basophils Relative: 0 %
Eosinophils Absolute: 0 10*3/uL (ref 0.0–0.5)
Eosinophils Relative: 0 %
HEMATOCRIT: 33.5 % — AB (ref 39.0–52.0)
Hemoglobin: 10.6 g/dL — ABNORMAL LOW (ref 13.0–17.0)
Lymphocytes Relative: 4 %
Lymphs Abs: 0.1 10*3/uL — ABNORMAL LOW (ref 0.7–4.0)
MCH: 33.5 pg (ref 26.0–34.0)
MCHC: 31.6 g/dL (ref 30.0–36.0)
MCV: 106 fL — ABNORMAL HIGH (ref 80.0–100.0)
Monocytes Absolute: 0.2 10*3/uL (ref 0.1–1.0)
Monocytes Relative: 5 %
NRBC: 0 % (ref 0.0–0.2)
Neutro Abs: 3.1 10*3/uL (ref 1.7–7.7)
Neutrophils Relative %: 76 %
Platelets: 50 10*3/uL — ABNORMAL LOW (ref 150–400)
RBC: 3.16 MIL/uL — ABNORMAL LOW (ref 4.22–5.81)
RDW: 12.8 % (ref 11.5–15.5)
WBC: 3.4 10*3/uL — ABNORMAL LOW (ref 4.0–10.5)

## 2018-03-16 LAB — C DIFFICILE QUICK SCREEN W PCR REFLEX
C Diff antigen: NEGATIVE
C Diff interpretation: NOT DETECTED
C Diff toxin: NEGATIVE

## 2018-03-16 LAB — GLUCOSE, CAPILLARY: Glucose-Capillary: 118 mg/dL — ABNORMAL HIGH (ref 70–99)

## 2018-03-16 LAB — I-STAT CG4 LACTIC ACID, ED: Lactic Acid, Venous: 1.33 mmol/L (ref 0.5–1.9)

## 2018-03-16 LAB — MRSA PCR SCREENING: MRSA by PCR: NEGATIVE

## 2018-03-16 MED ORDER — ONDANSETRON HCL 4 MG/2ML IJ SOLN
4.0000 mg | Freq: Four times a day (QID) | INTRAMUSCULAR | Status: DC | PRN
  Administered 2018-03-18: 4 mg via INTRAVENOUS
  Filled 2018-03-16: qty 2

## 2018-03-16 MED ORDER — AZITHROMYCIN 250 MG PO TABS: 500.0000 mg | ORAL_TABLET | Freq: Once | ORAL | Status: DC

## 2018-03-16 MED ORDER — SODIUM CHLORIDE 0.9 % IV SOLN: 500.0000 mg | INTRAVENOUS | Status: DC

## 2018-03-16 MED ORDER — ACETAMINOPHEN 325 MG PO TABS
650.0000 mg | ORAL_TABLET | Freq: Four times a day (QID) | ORAL | Status: DC | PRN
  Administered 2018-03-16 – 2018-03-23 (×9): 650 mg via ORAL
  Filled 2018-03-16 (×7): qty 2

## 2018-03-16 MED ORDER — MIDODRINE HCL 5 MG PO TABS
10.0000 mg | ORAL_TABLET | Freq: Three times a day (TID) | ORAL | Status: DC
  Administered 2018-03-16 – 2018-03-23 (×19): 10 mg via ORAL
  Filled 2018-03-16 (×15): qty 2

## 2018-03-16 MED ORDER — RENA-VITE PO TABS
1.0000 | ORAL_TABLET | Freq: Every day | ORAL | Status: DC
  Administered 2018-03-18 – 2018-03-22 (×5): 1 via ORAL
  Filled 2018-03-16 (×5): qty 1

## 2018-03-16 MED ORDER — CHLORHEXIDINE GLUCONATE CLOTH 2 % EX PADS: 6.0000 | MEDICATED_PAD | Freq: Every day | CUTANEOUS | Status: DC

## 2018-03-16 MED ORDER — VANCOMYCIN HCL IN DEXTROSE 1-5 GM/200ML-% IV SOLN: 1000.0000 mg | INTRAVENOUS | Status: DC

## 2018-03-16 MED ORDER — SODIUM CHLORIDE 0.9 % IV SOLN: 2.0000 g | INTRAVENOUS | Status: DC

## 2018-03-16 MED ORDER — ACETAMINOPHEN 500 MG PO TABS
1000.0000 mg | ORAL_TABLET | Freq: Once | ORAL | Status: AC
  Administered 2018-03-16: 1000 mg via ORAL
  Filled 2018-03-16: qty 2

## 2018-03-16 MED ORDER — SEVELAMER CARBONATE 800 MG PO TABS
4800.0000 mg | ORAL_TABLET | Freq: Three times a day (TID) | ORAL | Status: DC
  Administered 2018-03-17 – 2018-03-22 (×15): 4800 mg via ORAL
  Filled 2018-03-16 (×14): qty 6

## 2018-03-16 MED ORDER — SEVELAMER CARBONATE 800 MG PO TABS
2400.0000 mg | ORAL_TABLET | ORAL | Status: DC | PRN
  Administered 2018-03-16: 2400 mg via ORAL
  Filled 2018-03-16 (×2): qty 3

## 2018-03-16 MED ORDER — ONDANSETRON HCL 4 MG PO TABS: 4.0000 mg | ORAL_TABLET | Freq: Four times a day (QID) | ORAL | Status: DC | PRN

## 2018-03-16 MED ORDER — SODIUM CHLORIDE 0.9 % IV SOLN: 1.0000 g | INTRAVENOUS | Status: DC

## 2018-03-16 MED ORDER — INSULIN ASPART 100 UNIT/ML ~~LOC~~ SOLN
0.0000 [IU] | Freq: Three times a day (TID) | SUBCUTANEOUS | Status: DC
  Administered 2018-03-22: 1 [IU] via SUBCUTANEOUS

## 2018-03-16 MED ORDER — SODIUM CHLORIDE 0.9 % IV SOLN
2.0000 g | Freq: Once | INTRAVENOUS | Status: AC
  Administered 2018-03-16: 2 g via INTRAVENOUS
  Filled 2018-03-16: qty 2

## 2018-03-16 MED ORDER — SODIUM CHLORIDE 0.9 % IV SOLN
1.0000 g | Freq: Once | INTRAVENOUS | Status: AC
  Administered 2018-03-16: 1 g via INTRAVENOUS
  Filled 2018-03-16: qty 10

## 2018-03-16 MED ORDER — HEPARIN SODIUM (PORCINE) 5000 UNIT/ML IJ SOLN
5000.0000 [IU] | Freq: Three times a day (TID) | INTRAMUSCULAR | Status: DC
  Administered 2018-03-16: 5000 [IU] via SUBCUTANEOUS
  Filled 2018-03-16: qty 1

## 2018-03-16 MED ORDER — SODIUM CHLORIDE 0.9 % IV SOLN
1000.0000 mL | INTRAVENOUS | Status: DC
  Administered 2018-03-16: 1000 mL via INTRAVENOUS

## 2018-03-16 MED ORDER — VANCOMYCIN HCL 10 G IV SOLR
2000.0000 mg | Freq: Once | INTRAVENOUS | Status: AC
  Administered 2018-03-16: 2000 mg via INTRAVENOUS
  Filled 2018-03-16: qty 2000

## 2018-03-16 MED ORDER — ASPIRIN EC 81 MG PO TBEC
81.0000 mg | DELAYED_RELEASE_TABLET | Freq: Every day | ORAL | Status: DC
  Administered 2018-03-16 – 2018-03-22 (×7): 81 mg via ORAL
  Filled 2018-03-16 (×7): qty 1

## 2018-03-16 MED ORDER — ACETAMINOPHEN 650 MG RE SUPP: 650.0000 mg | Freq: Four times a day (QID) | RECTAL | Status: DC | PRN

## 2018-03-16 MED ORDER — SODIUM CHLORIDE 0.9 % IV SOLN
INTRAVENOUS | Status: DC
  Administered 2018-03-16: 11:00:00 via INTRAVENOUS

## 2018-03-16 NOTE — ED Provider Notes (Signed)
[facility] EMERGENCY DEPARTMENT Provider Note   CSN: [id number] Arrival date & time: 03/16/18  2836     History   Chief Complaint Chief Complaint  Patient presents with  . Diarrhea  . Weakness    HPI [name] is a 67 y.o. male.  HPI  The patient is a 67 year old male, he has a known history of coronary disease status post bypass grafting in 2013, he is known to have diabetes hypertension as well as renal cell carcinoma and end-stage renal disease.  He had a prior nephrectomy in [place] in 2016 followed by a partial colectomy and a liver resection because of residual tumor 2 years ago.  The patient presents today after having 2 days of frequent diarrhea as well as having increasing weakness which has resulted in several falls overnight.  Paramedics were called to the house 3 different times on 3 different occasions because of progressive weakness and the inability to get up off the floor.  His home health aide was also not able to get him off the ground because of their weakness and his weakness.  The patient has been progressively becoming more ill and is febrile on arrival.  He is able to answer my questions appropriately, he has chronic neck pain, he is totally blind and is unsure if the diarrhea has been bloody or not.  His weakness is persistent, gradually worsening and associated with a mild fever here.  He denies nausea vomiting or abdominal pain.  He has not missed any dialysis.  Primary care physician is Dr. Lang Snow at [facility]  Past Medical History:  Diagnosis Date  . Anemia   . Arthritis    "back, neck" (07/28/2014)  . Blind    both eyes removed   . Bruises easily   . CAD, NATIVE VESSEL 01/08/2008      . Cellulitis late 1980's   "hospitalized; wrapped both legs; several times; no OR for this"  . Colon polyps   . Diabetic neuropathy ([place])   . Dialysis patient ([place])   . DM type 2 (diabetes mellitus, type 2) ([place])    no  medications (07/28/2014) diet and excersie controlled not onmeds for 14-15 years   . Dysrhythmia    afib   . ESRD (end stage renal disease) on dialysis [facility])    [name]; Mon, Wed, Fri (07/28/2014)  . Fall    hx of fall and required left hip surgery   . Family history of breast cancer    mother  . Heart murmur    hx of  . History of blood product transfusion   . HYPERLIPIDEMIA-MIXED 01/08/2008  . HYPERTENSION 01/27/2009   BP low , hx of HTN, Currently on meds to raise BP  . Hypotension   . Kidney stones   . Low blood pressure   . OVERWEIGHT/OBESITY 01/08/2008   Lost 205 lbs through diet and exercise.    . Peripheral vascular disease ([place])    vein stripping  . Pneumonia 2000;s X 1  . Prostate cancer ([place])   . Prosthetic eye globe    both eyes  . Renal cell carcinoma 2001 and 2003   "both kidneys"  . Renal insufficiency     Patient Active Problem List   Diagnosis Date Noted  . CAP (community acquired pneumonia) 03/16/2018  . Other persistent atrial fibrillation 02/15/2018  . Renal cell cancer, right ([place]) 04/19/2017  . Macrocytic anemia 04/11/2016  . Diarrhea 04/11/2016  . Renal  cell carcinoma ([place]) 04/07/2016  . Hx of exploratory laparotomy 04/07/2016  . Shock circulatory ([place]) 04/07/2016  . Pre-operative cardiovascular examination 02/19/2016  . Bacteremia   . Abnormal CT of the abdomen   . Abdominal pain 11/01/2015  . UTI (lower urinary tract infection) 11/01/2015  . Sepsis ([place]) 11/01/2015  . Bilateral blindness 06/16/2015  . Carcinoma of kidney ([place]) 06/16/2015  . Type 2 diabetes mellitus ([place]) 06/16/2015  . Intra-abdominal infection 05/11/2015  . Chronic anemia 05/11/2015  . Infection in abdomen ([place]) 05/11/2015  . Second degree AV block, Mobitz type I 02/15/2015  . Atrioventricular block, Mobitz type 1, Wenckebach 02/15/2015  . Hypotension 02/14/2015  . Hypoglycemia 02/13/2015  . DM type 2, goal A1c below 7 02/13/2015  . Blind   . DM type 2 (diabetes  mellitus, type 2) ([place])   . Clear cell renal cell carcinoma (HCC)   . CAD in native artery 09/10/2014  . Renal carcinoma ([place])   . Non-intractable cyclical vomiting with nausea   . Hydronephrosis 07/28/2014  . Coccyx pain 01/03/2014  . Thrombocytopenia ([place]) 01/03/2014  . Intertrochanteric fracture of left hip ([place]) 12/30/2013  . Dialysis patient ([place]) 12/30/2013  . Hip fracture ([place]) 12/30/2013  . Dependence on renal dialysis ([place]) 12/30/2013  . Hypotension of hemodialysis 06/19/2013  . Malignant neoplasm of prostate ([place]) 01/30/2012  . Diabetes mellitus ([place]) 08/30/2011  . Peripheral vascular disease ([place]) 05/27/2009  . COLONIC POLYPS 02/12/2009  . ANEMIA 01/27/2009  . GLAUCOMA 01/27/2009  . Essential hypertension 01/27/2009  . ESRD (end stage renal disease) on dialysis ([place]) 01/27/2009  . End stage renal failure on dialysis ([place]) 01/27/2009  . DIAB W/UNS COMP TYPE II/UNS NOT STATED UNCNTRL 01/08/2008  . HYPERLIPIDEMIA-MIXED 01/08/2008  . OVERWEIGHT/OBESITY 01/08/2008  . DEPRESSIVE DISORDER NOT ELSEWHERE CLASSIFIED 01/08/2008  . Hx of CABG-2013 01/08/2008  . EDEMA 01/08/2008  . Clinical depression 01/08/2008  . HLD (hyperlipidemia) 01/08/2008    Past Surgical History:  Procedure Laterality Date  . AV FISTULA PLACEMENT Left 02/2010   LFA  . AV FISTULA PLACEMENT Left 01/12/2016   Procedure: LEFT BRACHIOCEPHALIC ARTERIOVENOUS (AV) FISTULA CREATION;  Surgeon: Angelia Mould, MD;  Location: [facility];  Service: Vascular;  Laterality: Left;  . CHOLECYSTECTOMY OPEN  1992  . COLONOSCOPY  06/14/2011   Procedure: COLONOSCOPY;  Surgeon: [name], MD;  [facility];  Service: Endoscopy;  Laterality: N/A;  . COLONOSCOPY N/A 07/24/2012   Procedure: COLONOSCOPY;  Surgeon: [name], MD;  [facility];  Service: Endoscopy;  Laterality: N/A;  . COLONOSCOPY    . COLONOSCOPY N/A 11/04/2015   Procedure: COLONOSCOPY;  Surgeon: Manus Gunning, MD;   Location[facility]COPY;  Service: Gastroenterology;  Laterality: N/A;  . COLONOSCOPY WITH PROPOFOL N/A 05/13/2014   Procedure: COLONOSCOPY WITH PROPOFOL;  Surgeon: [name], MD;  [facility];  Service: Endoscopy;  Laterality: N/A;  . CORONARY ARTERY BYPASS GRAFT  09/29/2011   Procedure: CORONARY ARTERY BYPASS GRAFTING (CABG);  Surgeon: [name], MD;  Location: [facility];  Service: Open Heart Surgery;  Laterality: N/A;  Coronary Artery Bypass Graft times two utilizing the left internal mammary artery and the right greater saphenous vein harvested endoscopically.  . CYSTOSCOPY WITH RETROGRADE PYELOGRAM, URETEROSCOPY AND STENT PLACEMENT Left 07/28/2014   Procedure: CYSTOSCOPY WITH RETROGRADE PYELOGRAM, URETERAL BALLOON DILITATION, URETEROSCOPY AND LEFT STENT PLACEMENT;  Surgeon: [name], MD;  Location[facility];  Service: Urology;  Laterality: Left;  . ENUCLEATION  2003; 2006  bilateral; "diabetes; pain"  . FOOT AMPUTATION Right ~ 2002   right;  partial; "infection"  . FRACTURE SURGERY     hip fx  . HIP PINNING,CANNULATED Left 12/31/2013   Procedure: CANNULATED HIP PINNING;  Surgeon: Renette Butters, MD;  Location: [facility];  Service: Orthopedics;  Laterality: Left;  Carm, FX Table, Stryker  . HOT HEMOSTASIS  06/14/2011   Procedure: HOT HEMOSTASIS (ARGON PLASMA COAGULATION/BICAP);  Surgeon: [name], MD;  [facility];  Service: Endoscopy;  Laterality: N/A;  . INSERTION OF DIALYSIS CATHETER Right 01/12/2016   Procedure: INSERTION OF DIALYSIS CATHETER;  Surgeon: Angelia Mould, MD;  Location: [facility];  Service: Vascular;  Laterality: Right;  . INSERTION PROSTATE RADIATION SEED  03/2012  . IR GENERIC HISTORICAL  01/13/2016   IR RADIOLOGIST EVAL & MGMT 01/13/2016 Aletta Edouard, MD [facility] INTERV RAD  . IR RADIOLOGIST EVAL & MGMT  03/02/2017  . IR RADIOLOGIST EVAL & MGMT  05/18/2017  . IR RADIOLOGIST EVAL & MGMT  07/27/2017  . LAPAROTOMY N/A 04/07/2016   Procedure:  EXPLORATORY LAPAROTOMY AND RESECTION OF RETROPERITONEAL MASS;  Surgeon: Raynelle Bring, MD;  [facility];  Service: Urology;  Laterality: N/A;  . LEFT HEART CATHETERIZATION WITH CORONARY ANGIOGRAM N/A 09/27/2011   Procedure: LEFT HEART CATHETERIZATION WITH CORONARY ANGIOGRAM;  Surgeon: [name], MD;  Location[facility] LAB;  Service: Cardiovascular;  Laterality: N/A;  . LIGATION OF ARTERIOVENOUS  FISTULA Left 01/12/2016   Procedure: LIGATION OF LEFT RADIOCEPHALIC ARTERIOVENOUS  FISTULA;  Surgeon: Angelia Mould, MD;  Location: [facility];  Service: Vascular;  Laterality: Left;  . LIGATION OF COMPETING BRANCHES OF ARTERIOVENOUS FISTULA Left 07/29/2014   Procedure: LIGATION OF COMPETING BRANCHES OF LEFT ARM ARTERIOVENOUS FISTULA;  Surgeon: [name], MD;  Location: [facility];  Service: Vascular;  Laterality: Left;  . NEPHRECTOMY Right 01/30/2015   done at [place]  . PARTIAL COLECTOMY Right 04/07/2016   Procedure: RIGHT COLECTOMY AND PARTIAL LIVER RESECTION;  Surgeon: Raynelle Bring, MD;  [facility];  Service: Urology;  Laterality: Right;  . RADIOFREQUENCY ABLATION N/A 04/19/2017   Procedure: CT MICROWAVE THERMAL ABLATION;  Surgeon: Aletta Edouard, MD;  [facility];  Service: Anesthesiology;  Laterality: N/A;  . RADIOFREQUENCY ABLATION KIDNEY  2010-2012   "twice; one on each side; for cancer"  . REVISON OF ARTERIOVENOUS FISTULA Left 09/01/2015   Procedure: EXPLORATION LEFT LOWER ARM  RADIOCEPHALIC ARTERIOVENOUS FISTULA;  Surgeon: Conrad Rose Hill, MD;  Location: [facility];  Service: Vascular;  Laterality: Left;  [name] VARICOSE VEIN SURGERY  mid 1980's    BLE; "knees down; both legs; 2 separate times"        Home Medications    Prior to Admission medications   Medication Sig Start Date End Date Taking? Authorizing Provider  aspirin EC 81 MG tablet Take 81 mg by mouth at bedtime.   Yes [provider]  atorvastatin (LIPITOR) 10 MG tablet Take 10 mg by mouth daily.   Yes  [provider]  dextromethorphan-guaiFENesin (MUCINEX DM) 30-600 MG 12hr tablet Take 1 tablet by mouth 2 (two) times daily.   Yes [provider]  Methoxy PEG-Epoetin Beta (MIRCERA) 50 MCG/0.3ML SOSY Inject 225 mcg as directed every 14 (fourteen) days.    Yes [provider]  midodrine (PROAMATINE) 10 MG tablet Take 1 tablet (10 mg total) by mouth 3 (three) times daily with meals. 11/05/15  Yes Reyne Dumas, MD  multivitamin (RENA-VIT) TABS tablet Take  1 tablet by mouth daily.   Yes [provider]  Probiotic Product (ALIGN) 4 MG CAPS Take 1 capsule by mouth daily.   Yes [provider]  sevelamer carbonate (RENVELA) 800 MG tablet Take 2,400-4,800 mg by mouth See admin instructions. Take 6 tablets (4800 mg) by mouth 3 times daily with meals and 3 tablets (2400 mg) 2 times daily with snacks 06/26/14  Yes [provider]    Family History Family History  Problem Relation Age of Onset  . Cancer Mother 81       breast, spine and ovarian  . Heart disease Mother   . Hyperlipidemia Mother   . Hypertension Mother   . Varicose Veins Mother   . Emphysema Father   . Cancer Father 46       kidney, prostate  . Heart disease Father   . Hyperlipidemia Father   . Hypertension Father   . Cancer Sister        ovarian  . Cancer Brother        lung  . Heart disease Brother   . Hyperlipidemia Brother   . Hypertension Brother   . Heart attack Brother   . Peripheral vascular disease Brother   . Cancer Other   . Coronary artery disease Other   . Kidney disease Other   . Breast cancer Other   . Ovarian cancer Other   . Lung cancer Other   . Diabetes Other   . Malignant hyperthermia Neg Hx     Social History Social History   Tobacco Use  . Smoking status: Never Smoker  . Smokeless tobacco: Never Used  Substance Use Topics  . Alcohol use: No    Alcohol/week: 0.0 standard drinks  . Drug use: No     Allergies   Codeine and  Tape   Review of Systems Review of Systems  All other systems reviewed and are negative.   Physical Exam Updated Vital Signs BP (!) 95/47   Pulse (!) 50   Temp (!) 100.9 F (38.3 C) (Rectal)   Resp 15   SpO2 99%   Physical Exam Vitals signs and nursing note reviewed.  Constitutional:      General: He is not in acute distress.    Appearance: He is well-developed.     Comments: The patient is moaning stating that the pain in his neck is causing him pain, this is chronic  HENT:     Head: Normocephalic and atraumatic.     Comments: Mucous membranes are slightly dehydrated    Nose: Nose normal.     Mouth/Throat:     Pharynx: No oropharyngeal exudate.  Eyes:     General: No scleral icterus.       Right eye: No discharge.        Left eye: No discharge.     Conjunctiva/sclera: Conjunctivae normal.     Pupils: Pupils are equal, round, and reactive to light.  Neck:     Musculoskeletal: Normal range of motion.     Thyroid: No thyromegaly.     Vascular: No JVD.     Comments: Decreased range of motion of the neck, he holds his neck in advanced kyphosis Cardiovascular:     Rate and Rhythm: Normal rate.     Heart sounds: Normal heart sounds. No murmur. No friction rub. No gallop.      Comments: Irregularly irregular rhythm Pulmonary:     Effort: Pulmonary effort is normal. No respiratory distress.  Breath sounds: Normal breath sounds. No wheezing or rales.  Abdominal:     General: Bowel sounds are normal. There is no distension.     Palpations: Abdomen is soft. There is no mass.     Tenderness: There is no abdominal tenderness.  Genitourinary:    Comments: No decubitus ulcers present, mucoid beige loose stool in the rectal vault Musculoskeletal: Normal range of motion.        General: No tenderness.     Comments: Partial amputation of the foot  Lymphadenopathy:     Cervical: No cervical adenopathy.  Skin:    General: Skin is warm and dry.     Findings: No erythema or  rash.  Neurological:     Mental Status: He is alert.     Coordination: Coordination normal.     Comments: The patient is able to answer my questions and assist in rolling from side to side but is generally globally weak  Psychiatric:        Behavior: Behavior normal.      ED Treatments / Results  Labs (all labs ordered are listed, but only abnormal results are displayed) Labs Reviewed  COMPREHENSIVE METABOLIC PANEL - Abnormal; Notable for the following components:      Result Value   Glucose, Bld 144 (*)    BUN 78 (*)    Creatinine, Ser 7.58 (*)    Calcium 8.7 (*)    Total Protein 6.1 (*)    GFR calc non Af Amer 7 (*)    GFR calc Af Amer 8 (*)    Anion gap 19 (*)    All other components within normal limits  CBC WITH DIFFERENTIAL/PLATELET - Abnormal; Notable for the following components:   WBC 3.4 (*)    RBC 3.16 (*)    Hemoglobin 10.6 (*)    HCT 33.5 (*)    MCV 106.0 (*)    Platelets 50 (*)    Lymphs Abs 0.1 (*)    All other components within normal limits  C DIFFICILE QUICK SCREEN W PCR REFLEX  CULTURE, BLOOD (ROUTINE X 2)  CULTURE, BLOOD (ROUTINE X 2)  EXPECTORATED SPUTUM ASSESSMENT W REFEX TO RESP CULTURE  GRAM STAIN  URINALYSIS, ROUTINE W REFLEX MICROSCOPIC  STREP PNEUMONIAE URINARY ANTIGEN  I-STAT CG4 LACTIC ACID, ED  I-STAT CG4 LACTIC ACID, ED    EKG EKG Interpretation  Date/Time:  Friday March 16 2018 07:55:00 EST Ventricular Rate:  61 PR Interval:    QRS Duration: 108 QT Interval:  544 QTC Calculation: 516 R Axis:   6 Text Interpretation:  bradycardia with irregularity Repol abnrm suggests ischemia, anterolateral Prolonged QT interval Since last tracing some irregularity now present Confirmed by [name] [phone]) on 03/16/2018 8:04:54 AM   Radiology Dg Chest Port 1 View  Result Date: 03/16/2018 CLINICAL DATA:  Multiple falls EXAM: PORTABLE CHEST 1 VIEW COMPARISON:  Portable exam 0803 hours compared to 04/18/2017 FINDINGS: Upper normal  heart size post CABG. Mediastinal contours and pulmonary vascularity normal. RIGHT basilar atelectasis. Mild chronic elevation of RIGHT diaphragm. Remaining lungs clear. No acute infiltrate, pleural effusion or pneumothorax. Bones demineralized. IMPRESSION: RIGHT basilar atelectasis. Electronically Signed   By: Lavonia Dana M.D.   On: 03/16/2018 08:24    Procedures .Critical Care Performed by: [name], MD Authorized by: [name], MD   Critical care provider statement:    Critical care time (minutes):  35   Critical care time was exclusive of:  Separately billable procedures and treating  other patients and teaching time   Critical care was necessary to treat or prevent imminent or life-threatening deterioration of the following conditions:  Sepsis   Critical care was time spent personally by me on the following activities:  Blood draw for specimens, development of treatment plan with patient or surrogate, discussions with consultants, evaluation of patient's response to treatment, examination of patient, obtaining history from patient or surrogate, ordering and performing treatments and interventions, ordering and review of laboratory studies, ordering and review of radiographic studies, pulse oximetry, re-evaluation of patient's condition and review of old charts   (including critical care time)  Medications Ordered in ED Medications  0.9 %  sodium chloride infusion (1,000 mLs Intravenous New Bag/Given 03/16/18 0832)  cefTRIAXone (ROCEPHIN) 1 g in sodium chloride 0.9 % 100 mL IVPB (1 g Intravenous New Bag/Given 03/16/18 1100)  0.9 %  sodium chloride infusion ( Intravenous New Bag/Given 03/16/18 1101)  heparin injection 5,000 Units (has no administration in time range)  ondansetron (ZOFRAN) tablet 4 mg (has no administration in time range)    Or  ondansetron (ZOFRAN) injection 4 mg (has no administration in time range)  acetaminophen (TYLENOL) tablet 650 mg (has no administration in  time range)    Or  acetaminophen (TYLENOL) suppository 650 mg (has no administration in time range)  cefTRIAXone (ROCEPHIN) 1 g in sodium chloride 0.9 % 100 mL IVPB (has no administration in time range)  azithromycin (ZITHROMAX) 500 mg in sodium chloride 0.9 % 250 mL IVPB (has no administration in time range)  acetaminophen (TYLENOL) tablet 1,000 mg (1,000 mg Oral Given 03/16/18 8119)     Initial Impression / Assessment and Plan / ED Course  I have reviewed the triage vital signs and the nursing notes.  Pertinent labs & imaging results that were available during my care of the patient were reviewed by me and considered in my medical decision making (see chart for details).  Clinical Course as of Mar 16 1110  Fri Mar 16, 2018  0832 I have personally viewed the CXR - there is R basilar atelectasis vs PNA - I agree with the radiologist interpretation.   [BM]  1478 Metabolic panel is similar compared to prior lab values with no significant electrolyte dysfunction, baseline renal function stable.   [BM]  2956 CBC shows mild leukopenia, chronic anemia and a thrombocytopenia which is worse than prior.  Platelet level of 50,000 less than prior levels closer to 80 or 90,000   [BM]  1031 BP soft - will give 1 L of IVF, antibiotics ordered to cover for possible pneumonia.  C dif negative   [BM]  1036 Labs reviewed showing that the platelet level is 50,000, he does have ITP.  The patient is generally weak, he is not tachycardic, he has been bradycardic persistently over time even on prior visits.  Tylenol given for fever, x-ray shows possible infiltrate versus atelectasis and with the patient's cough and weakness will admit for possible pneumonia.  He is getting hydration at this time, consulted hospitalist.   [BM]    Clinical Course User Index [BM] [name], MD   The patient has no focal weakness but is globally weak raising concern for progressive infection since he does have a fever of  100.9 by rectal route.  He has been having the occasional cough, he has some progressive diarrhea, will need to evaluate for C. difficile, pneumonia, he is not tachycardic.  The patient has had some progressive hypotension, IV fluids and antibiotics  were ordered, discussed with the hospitalist for admission  The patient is critically ill with progressive signs of worsening infection  Final Clinical Impressions(s) / ED Diagnoses   Final diagnoses:  Fever, unspecified fever cause  Community acquired pneumonia, unspecified laterality  Weakness  Diarrhea, unspecified type  Thrombocytopenia ([place])      [name], MD 03/16/18 [phone]

## 2018-03-16 NOTE — ED Notes (Signed)
Attempted report 

## 2018-03-16 NOTE — Consult Note (Signed)
Renal Service Consult Note [name]  [name] [name] 03/16/2018 [name] Requesting Physician:  Dr Olevia Bowens  Reason for Consult:  ESRD pt w/ fevers and PNA HPI: The patient is a 67 y.o. year-old with hx of ESRD on HD 7-8 yrs, DM2, blind d/t diabetic complications, PAD w/ TMA right side presented to ED w/ 2-3 days hx of fevers, chills, coughing .  Today fell OOB twice and out of the motorized WC once, prompting family to bring him to ED.  Asked to see for ESRD.    Son gives hx pt w/ hx of renal cell cancer and nephrectomy. More recently was diagnosed w/ another cancer but he is not sure the name of it.   ROS  denies CP  no joint pain   no HA  no blurry vision  no rash  no diarrhea  no nausea/ vomiting  Past Medical History  Past Medical History:  Diagnosis Date  . Anemia   . Arthritis    "back, neck" (07/28/2014)  . Blind    both eyes removed   . Bruises easily   . CAD, NATIVE VESSEL 01/08/2008      . Cellulitis late 1980's   "hospitalized; wrapped both legs; several times; no OR for this"  . Colon polyps   . Diabetic neuropathy ([place])   . Dialysis patient ([place])   . DM type 2 (diabetes mellitus, type 2) ([place])    no medications (07/28/2014) diet and excersie controlled not onmeds for 14-15 years   . Dysrhythmia    afib   . ESRD (end stage renal disease) on dialysis [name])    [name]; Mon, Wed, Fri (07/28/2014)  . Fall    hx of fall and required left hip surgery   . Family history of breast cancer    mother  . Heart murmur    hx of  . History of blood product transfusion   . HYPERLIPIDEMIA-MIXED 01/08/2008  . HYPERTENSION 01/27/2009   BP low , hx of HTN, Currently on meds to raise BP  . Hypotension   . Kidney stones   . Low blood pressure   . OVERWEIGHT/OBESITY 01/08/2008   Lost 205 lbs through diet and exercise.    . Peripheral vascular disease ([place])    vein stripping  . Pneumonia 2000;s X 1  . Prostate cancer ([place])   . Prosthetic eye  globe    both eyes  . Renal cell carcinoma 2001 and 2003   "both kidneys"  . Renal insufficiency    Past Surgical History  Past Surgical History:  Procedure Laterality Date  . AV FISTULA PLACEMENT Left 02/2010   LFA  . AV FISTULA PLACEMENT Left 01/12/2016   Procedure: LEFT BRACHIOCEPHALIC ARTERIOVENOUS (AV) FISTULA CREATION;  Surgeon: Angelia Mould, MD;  Location: [place];  Service: Vascular;  Laterality: Left;  . CHOLECYSTECTOMY OPEN  1992  . COLONOSCOPY  06/14/2011   Procedure: COLONOSCOPY;  Surgeon: [name], MD;  [facility]: [facility];  Service: Endoscopy;  Laterality: N/A;  . COLONOSCOPY N/A 07/24/2012   Procedure: COLONOSCOPY;  Surgeon: [name], MD;  [facility];  Service: Endoscopy;  Laterality: N/A;  . COLONOSCOPY    . COLONOSCOPY N/A 11/04/2015   Procedure: COLONOSCOPY;  Surgeon: Manus Gunning, MD;  Location: [name];  Service: Gastroenterology;  Laterality: N/A;  . COLONOSCOPY WITH PROPOFOL N/A 05/13/2014   Procedure: COLONOSCOPY WITH PROPOFOL;  Surgeon: [name], MD;  [facility];  Service: Endoscopy;  Laterality: N/A;  . CORONARY ARTERY BYPASS GRAFT  09/29/2011   Procedure: CORONARY ARTERY BYPASS GRAFTING (CABG);  Surgeon: [name], MD;  Location: [place];  Service: Open Heart Surgery;  Laterality: N/A;  Coronary Artery Bypass Graft times two utilizing the left internal mammary artery and the right greater saphenous vein harvested endoscopically.  . CYSTOSCOPY WITH RETROGRADE PYELOGRAM, URETEROSCOPY AND STENT PLACEMENT Left 07/28/2014   Procedure: CYSTOSCOPY WITH RETROGRADE PYELOGRAM, URETERAL BALLOON DILITATION, URETEROSCOPY AND LEFT STENT PLACEMENT;  Surgeon: [name], MD;  Location: [facility];  Service: Urology;  Laterality: Left;  . ENUCLEATION  2003; 2006   bilateral; "diabetes; pain"  . FOOT AMPUTATION Right ~ 2002   right;  partial; "infection"  . FRACTURE SURGERY     hip fx  . HIP PINNING,CANNULATED Left  12/31/2013   Procedure: CANNULATED HIP PINNING;  Surgeon: Renette Butters, MD;  Location: [place];  Service: Orthopedics;  Laterality: Left;  Carm, FX Table, [employer]  . HOT HEMOSTASIS  06/14/2011   Procedure: HOT HEMOSTASIS (ARGON PLASMA COAGULATION/BICAP);  Surgeon: [name], MD;  Location: [facility];  Service: Endoscopy;  Laterality: N/A;  . INSERTION OF DIALYSIS CATHETER Right 01/12/2016   Procedure: INSERTION OF DIALYSIS CATHETER;  Surgeon: Angelia Mould, MD;  Location: [place];  Service: Vascular;  Laterality: Right;  . INSERTION PROSTATE RADIATION SEED  03/2012  . IR GENERIC HISTORICAL  01/13/2016   IR RADIOLOGIST EVAL & MGMT 01/13/2016 Aletta Edouard, MD [facility] INTERV RAD  . IR RADIOLOGIST EVAL & MGMT  03/02/2017  . IR RADIOLOGIST EVAL & MGMT  05/18/2017  . IR RADIOLOGIST EVAL & MGMT  07/27/2017  . LAPAROTOMY N/A 04/07/2016   Procedure: EXPLORATORY LAPAROTOMY AND RESECTION OF RETROPERITONEAL MASS;  Surgeon: Raynelle Bring, MD;  [facility];  Service: Urology;  Laterality: N/A;  . LEFT HEART CATHETERIZATION WITH CORONARY ANGIOGRAM N/A 09/27/2011   Procedure: LEFT HEART CATHETERIZATION WITH CORONARY ANGIOGRAM;  Surgeon: [name], MD;  Location: [name];  Service: Cardiovascular;  Laterality: N/A;  . LIGATION OF ARTERIOVENOUS  FISTULA Left 01/12/2016   Procedure: LIGATION OF LEFT RADIOCEPHALIC ARTERIOVENOUS  FISTULA;  Surgeon: Angelia Mould, MD;  Location: [place];  Service: Vascular;  Laterality: Left;  . LIGATION OF COMPETING BRANCHES OF ARTERIOVENOUS FISTULA Left 07/29/2014   Procedure: LIGATION OF COMPETING BRANCHES OF LEFT ARM ARTERIOVENOUS FISTULA;  Surgeon: [name], MD;  Location: [place];  Service: Vascular;  Laterality: Left;  . NEPHRECTOMY Right 01/30/2015   done at [place]  . PARTIAL COLECTOMY Right 04/07/2016   Procedure: RIGHT COLECTOMY AND PARTIAL LIVER RESECTION;  Surgeon: Raynelle Bring, MD;  [facility];  Service: Urology;   Laterality: Right;  . RADIOFREQUENCY ABLATION N/A 04/19/2017   Procedure: CT MICROWAVE THERMAL ABLATION;  Surgeon: Aletta Edouard, MD;  [facility];  Service: Anesthesiology;  Laterality: N/A;  . RADIOFREQUENCY ABLATION KIDNEY  2010-2012   "twice; one on each side; for cancer"  . REVISON OF ARTERIOVENOUS FISTULA Left 09/01/2015   Procedure: EXPLORATION LEFT LOWER ARM  RADIOCEPHALIC ARTERIOVENOUS FISTULA;  Surgeon: Conrad Delta, MD;  Location: [place];  Service: Vascular;  Laterality: Left;  [name] VARICOSE VEIN SURGERY  mid 1980's    BLE; "knees down; both legs; 2 separate times"   Family History  Family History  Problem Relation Age of Onset  . Cancer Mother 55       breast, spine and ovarian  . Heart disease Mother   .  Hyperlipidemia Mother   . Hypertension Mother   . Varicose Veins Mother   . Emphysema Father   . Cancer Father 59       kidney, prostate  . Heart disease Father   . Hyperlipidemia Father   . Hypertension Father   . Cancer Sister        ovarian  . Cancer Brother        lung  . Heart disease Brother   . Hyperlipidemia Brother   . Hypertension Brother   . Heart attack Brother   . Peripheral vascular disease Brother   . Cancer Other   . Coronary artery disease Other   . Kidney disease Other   . Breast cancer Other   . Ovarian cancer Other   . Lung cancer Other   . Diabetes Other   . Malignant hyperthermia Neg Hx    Social History  reports that he has never smoked. He has never used smokeless tobacco. He reports that he does not drink alcohol or use drugs. Allergies  Allergies  Allergen Reactions  . Codeine Other (See Comments)     [place]  . Tape Other (See Comments)    Plastic tape tears skin off, please use paper tape instead.   Home medications Prior to Admission medications   Medication Sig Start Date End Date Taking? Authorizing Provider  aspirin EC 81 MG tablet Take 81 mg by mouth at bedtime.   Yes [provider]   atorvastatin (LIPITOR) 10 MG tablet Take 10 mg by mouth daily.   Yes [provider]  dextromethorphan-guaiFENesin (MUCINEX DM) 30-600 MG 12hr tablet Take 1 tablet by mouth 2 (two) times daily.   Yes [provider]  Methoxy PEG-Epoetin Beta (MIRCERA) 50 MCG/0.3ML SOSY Inject 225 mcg as directed every 14 (fourteen) days.    Yes [provider]  midodrine (PROAMATINE) 10 MG tablet Take 1 tablet (10 mg total) by mouth 3 (three) times daily with meals. 11/05/15  Yes Reyne Dumas, MD  multivitamin (RENA-VIT) TABS tablet Take 1 tablet by mouth daily.   Yes [provider]  Probiotic Product (ALIGN) 4 MG CAPS Take 1 capsule by mouth daily.   Yes [provider]  sevelamer carbonate (RENVELA) 800 MG tablet Take 2,400-4,800 mg by mouth See admin instructions. Take 6 tablets (4800 mg) by mouth 3 times daily with meals and 3 tablets (2400 mg) 2 times daily with snacks 06/26/14  Yes [provider]   Liver Function Tests Recent Labs  Lab 03/16/18 0801  AST 19  ALT 25  ALKPHOS 106  BILITOT 1.1  PROT 6.1*  ALBUMIN 3.7   No results for input(s): LIPASE, AMYLASE in the last 168 hours. CBC Recent Labs  Lab 03/16/18 0801  WBC 3.4*  NEUTROABS 3.1  HGB 10.6*  HCT 33.5*  MCV 106.0*  PLT 50*   Basic Metabolic Panel Recent Labs  Lab 03/16/18 0801  NA 144  K 3.7  CL 99  CO2 26  GLUCOSE 144*  BUN 78*  CREATININE 7.58*  CALCIUM 8.7*   Iron/TIBC/Ferritin/ %Sat    Component Value Date/Time   IRON 26 (L) 04/12/2016 0657   TIBC 200 (L) 04/12/2016 0657   FERRITIN 1,350 (H) 04/12/2016 0657   IRONPCTSAT 13 (L) 04/12/2016 0657    Vitals:   03/16/18 1200 03/16/18 1245 03/16/18 1300 03/16/18 1413  BP: 118/62 (!) 118/56 120/61 106/60  Pulse: (!) 53 (!) 58 (!) 45 (!) 54  Resp: 18 18 (!)  21 15  Temp:      TempSrc:      SpO2: 100% 95% 99% 100%  Weight:   91.6 kg   Height:   6\' 4"  (1.93 m)    Exam Gen alert, frail older adult male,  coughing, not in distress, cachectic No rash, cyanosis or gangrene Sclera anicteric, throat clear  No jvd or bruits Chest bilat coarse rhonchi, no wheezing RRR no MRG +sternotomy scar Abd soft ntnd no mass or ascites +bs GU normal male MS no joint effusions or deformity Ext R TMA, no LE edema, no wounds or ulcers Neuro is groggy but arouses and Ox 2, answers questions L AVF+bruit   Home meds:  - aspirin 81/ atorvastatin 10/ MVI/ sevelamer carb ac tid  - midodrine 10 tid   Dialysis: MWF [facility]  4h  450/800   90kg  No heparin  LUE AVF  - vit D 2.5 ug tiw  -parsabiv 15 tiw IV  - midodrine 10 tid  - last Hb 11.6     Impression/ Plan: 1. Fevers/ PNA - per primary team 2. ESRD on HD: usual HD MWF.  Will postpone HD until tomorrow, may feel better tomorrow w/ abx.   3. Volume - stable, no excess. CXR nad.  4. PAD sp TMA 5. H/o renal cell cancer 6. MBD ckd - cont meds 7. Anemia ckd - not on esa at clinic 8. DM2 - not on medication it appears 9. Chron hypotension - on midodrine tid   Kelly Splinter MD [place] pager [phone]   03/16/2018, 2:57 PM

## 2018-03-16 NOTE — H&P (Signed)
History and Physical    [name] FOY:[id number] DOB: [birth year] DOA: 03/16/2018  PCP: [name], MD   Patient coming from: Home.  I have personally briefly reviewed patient's old medical records in [place]  Chief Complaint: Multiple falls and diarrhea.  HPI: [name] is a 67 y.o. male with medical history significant of anemia, osteoarthritis, bilateral eye blindness, CAD/CABG, history of cellulitis to lower extremities multiple times, colon polyps, diabetic peripheral neuropathy, type 2 diabetes, ESRD on hemodialysis, history of fall, hyperlipidemia, hypertension, urolithiasis, prostate cancer, peripheral vascular disease, history of pneumonia, renal cell carcinoma of both kidneys who was brought to the emergency department due to having multiple falls and 3 episodes of diarrhea since yesterday.  He also had a fever and has been having cough, which he describes as nonproductive, but also has been having dyspnea, rhonchi and wheezing.  He denies headache, sore throat, hemoptysis, chest pain, palpitations, dizziness, diaphoresis, PND, orthopnea.  He occasionally gets pitting edema of the lower extremities.  Denies abdominal pain, nausea, emesis, constipation, melena or hematochezia.  He does not urinate as often anymore.  Denies dysuria, frequency or hematuria.  No polyuria, polydipsia, polyphagia or blurred vision  ED Course: Temperature 100.9, pulse 63, respirations 16, blood pressure 133/57 mmHg O2 sat 95% on room air.  The patient was given supplemental oxygen, cefepime and vancomycin in the emergency department.  CBC showed a white count of 3.4 with 76% neutrophils 15% bands, hemoglobin 10.6 g/dL and platelets 50.  C. difficile antigen and toxin were negative.  CMP shows normal electrolytes when calcium is corrected to albumin.  Transaminases and bilirubin are within normal limits.  BUN was 78, creatinine 7.58 and glucose 144 mg/dL.  Lactic acid was normal. He is chest  radiograph showed right basilar atelectasis.  Review of Systems: As per HPI otherwise 10 point review of systems negative.   Past Medical History:  Diagnosis Date  . Anemia   . Arthritis    "back, neck" (07/28/2014)  . Blind    both eyes removed   . Bruises easily   . CAD, NATIVE VESSEL 01/08/2008      . Cellulitis late 1980's   "hospitalized; wrapped both legs; several times; no OR for this"  . Colon polyps   . Diabetic neuropathy ([place])   . Dialysis patient ([place])   . DM type 2 (diabetes mellitus, type 2) ([place])    no medications (07/28/2014) diet and excersie controlled not onmeds for 14-15 years   . Dysrhythmia    afib   . ESRD (end stage renal disease) on dialysis [facility])    [name]; Mon, Wed, Fri (07/28/2014)  . Fall    hx of fall and required left hip surgery   . Family history of breast cancer    mother  . Heart murmur    hx of  . History of blood product transfusion   . HYPERLIPIDEMIA-MIXED 01/08/2008  . HYPERTENSION 01/27/2009   BP low , hx of HTN, Currently on meds to raise BP  . Hypotension   . Kidney stones   . Low blood pressure   . OVERWEIGHT/OBESITY 01/08/2008   Lost 205 lbs through diet and exercise.    . Peripheral vascular disease ([place])    vein stripping  . Pneumonia 2000;s X 1  . Prostate cancer ([place])   . Prosthetic eye globe    both eyes  . Renal cell carcinoma 2001 and 2003   "both kidneys"  . Renal insufficiency  Past Surgical History:  Procedure Laterality Date  . AV FISTULA PLACEMENT Left 02/2010   LFA  . AV FISTULA PLACEMENT Left 01/12/2016   Procedure: LEFT BRACHIOCEPHALIC ARTERIOVENOUS (AV) FISTULA CREATION;  Surgeon: Angelia Mould, MD;  Location: [place];  Service: Vascular;  Laterality: Left;  . CHOLECYSTECTOMY OPEN  1992  . COLONOSCOPY  06/14/2011   Procedure: COLONOSCOPY;  Surgeon: [name], MD;  Location: [facility];  Service: Endoscopy;  Laterality: N/A;  . COLONOSCOPY N/A 07/24/2012   Procedure: COLONOSCOPY;   Surgeon: [name], MD;  [facility];  Service: Endoscopy;  Laterality: N/A;  . COLONOSCOPY    . COLONOSCOPY N/A 11/04/2015   Procedure: COLONOSCOPY;  Surgeon: Manus Gunning, MD;  Location[facility];  Service: Gastroenterology;  Laterality: N/A;  . COLONOSCOPY WITH PROPOFOL N/A 05/13/2014   Procedure: COLONOSCOPY WITH PROPOFOL;  Surgeon: [name], MD;  [facility];  Service: Endoscopy;  Laterality: N/A;  . CORONARY ARTERY BYPASS GRAFT  09/29/2011   Procedure: CORONARY ARTERY BYPASS GRAFTING (CABG);  Surgeon: [name], MD;  Location: [place];  Service: Open Heart Surgery;  Laterality: N/A;  Coronary Artery Bypass Graft times two utilizing the left internal mammary artery and the right greater saphenous vein harvested endoscopically.  . CYSTOSCOPY WITH RETROGRADE PYELOGRAM, URETEROSCOPY AND STENT PLACEMENT Left 07/28/2014   Procedure: CYSTOSCOPY WITH RETROGRADE PYELOGRAM, URETERAL BALLOON DILITATION, URETEROSCOPY AND LEFT STENT PLACEMENT;  Surgeon: [name], MD;  [facility];  Service: Urology;  Laterality: Left;  . ENUCLEATION  2003; 2006   bilateral; "diabetes; pain"  . FOOT AMPUTATION Right ~ 2002   right;  partial; "infection"  . FRACTURE SURGERY     hip fx  . HIP PINNING,CANNULATED Left 12/31/2013   Procedure: CANNULATED HIP PINNING;  Surgeon: Renette Butters, MD;  Location: [place];  Service: Orthopedics;  Laterality: Left;  Carm, FX Table, [employer]  . HOT HEMOSTASIS  06/14/2011   Procedure: HOT HEMOSTASIS (ARGON PLASMA COAGULATION/BICAP);  Surgeon: [name], MD;  Location[facility];  Service: Endoscopy;  Laterality: N/A;  . INSERTION OF DIALYSIS CATHETER Right 01/12/2016   Procedure: INSERTION OF DIALYSIS CATHETER;  Surgeon: Angelia Mould, MD;  Location: [place];  Service: Vascular;  Laterality: Right;  . INSERTION PROSTATE RADIATION SEED  03/2012  . IR GENERIC HISTORICAL  01/13/2016   IR RADIOLOGIST EVAL & MGMT 01/13/2016  Aletta Edouard, MD [facility] INTERV RAD  . IR RADIOLOGIST EVAL & MGMT  03/02/2017  . IR RADIOLOGIST EVAL & MGMT  05/18/2017  . IR RADIOLOGIST EVAL & MGMT  07/27/2017  . LAPAROTOMY N/A 04/07/2016   Procedure: EXPLORATORY LAPAROTOMY AND RESECTION OF RETROPERITONEAL MASS;  Surgeon: Raynelle Bring, MD;  [facility];  Service: Urology;  Laterality: N/A;  . LEFT HEART CATHETERIZATION WITH CORONARY ANGIOGRAM N/A 09/27/2011   Procedure: LEFT HEART CATHETERIZATION WITH CORONARY ANGIOGRAM;  Surgeon: [name], MD;  Location: [facility] CATH LAB;  Service: Cardiovascular;  Laterality: N/A;  . LIGATION OF ARTERIOVENOUS  FISTULA Left 01/12/2016   Procedure: LIGATION OF LEFT RADIOCEPHALIC ARTERIOVENOUS  FISTULA;  Surgeon: Angelia Mould, MD;  Location: [place];  Service: Vascular;  Laterality: Left;  . LIGATION OF COMPETING BRANCHES OF ARTERIOVENOUS FISTULA Left 07/29/2014   Procedure: LIGATION OF COMPETING BRANCHES OF LEFT ARM ARTERIOVENOUS FISTULA;  Surgeon: [name], MD;  Location: [place];  Service: Vascular;  Laterality: Left;  . NEPHRECTOMY Right 01/30/2015   done at [place]  .  PARTIAL COLECTOMY Right 04/07/2016   Procedure: RIGHT COLECTOMY AND PARTIAL LIVER RESECTION;  Surgeon: Raynelle Bring, MD;  [facility];  Service: Urology;  Laterality: Right;  . RADIOFREQUENCY ABLATION N/A 04/19/2017   Procedure: CT MICROWAVE THERMAL ABLATION;  Surgeon: Aletta Edouard, MD;  [facility];  Service: Anesthesiology;  Laterality: N/A;  . RADIOFREQUENCY ABLATION KIDNEY  2010-2012   "twice; one on each side; for cancer"  . REVISON OF ARTERIOVENOUS FISTULA Left 09/01/2015   Procedure: EXPLORATION LEFT LOWER ARM  RADIOCEPHALIC ARTERIOVENOUS FISTULA;  Surgeon: Conrad Penn, MD;  Location: [place];  Service: Vascular;  Laterality: Left;  [name] VARICOSE VEIN SURGERY  mid 1980's    BLE; "knees down; both legs; 2 separate times"     reports that he has never smoked. He has never used smokeless tobacco. He  reports that he does not drink alcohol or use drugs.  Allergies  Allergen Reactions  . Codeine Other (See Comments)     [place]  . Tape Other (See Comments)    Plastic tape tears skin off, please use paper tape instead.    Family History  Problem Relation Age of Onset  . Cancer Mother 46       breast, spine and ovarian  . Heart disease Mother   . Hyperlipidemia Mother   . Hypertension Mother   . Varicose Veins Mother   . Emphysema Father   . Cancer Father 68       kidney, prostate  . Heart disease Father   . Hyperlipidemia Father   . Hypertension Father   . Cancer Sister        ovarian  . Cancer Brother        lung  . Heart disease Brother   . Hyperlipidemia Brother   . Hypertension Brother   . Heart attack Brother   . Peripheral vascular disease Brother   . Cancer Other   . Coronary artery disease Other   . Kidney disease Other   . Breast cancer Other   . Ovarian cancer Other   . Lung cancer Other   . Diabetes Other   . Malignant hyperthermia Neg Hx    Prior to Admission medications   Medication Sig Start Date End Date Taking? Authorizing Provider  aspirin EC 81 MG tablet Take 81 mg by mouth at bedtime.   Yes [provider]  atorvastatin (LIPITOR) 10 MG tablet Take 10 mg by mouth daily.   Yes [provider]  dextromethorphan-guaiFENesin (MUCINEX DM) 30-600 MG 12hr tablet Take 1 tablet by mouth 2 (two) times daily.   Yes [provider]  Methoxy PEG-Epoetin Beta (MIRCERA) 50 MCG/0.3ML SOSY Inject 225 mcg as directed every 14 (fourteen) days.    Yes [provider]  midodrine (PROAMATINE) 10 MG tablet Take 1 tablet (10 mg total) by mouth 3 (three) times daily with meals. 11/05/15  Yes Reyne Dumas, MD  multivitamin (RENA-VIT) TABS tablet Take 1 tablet by mouth daily.   Yes [provider]  Probiotic Product (ALIGN) 4 MG CAPS Take 1 capsule by mouth daily.   Yes [provider]  sevelamer  carbonate (RENVELA) 800 MG tablet Take 2,400-4,800 mg by mouth See admin instructions. Take 6 tablets (4800 mg) by mouth 3 times daily with meals and 3 tablets (2400 mg) 2 times daily with snacks 06/26/14  Yes [provider]    Physical Exam: Vitals:   03/16/18 1145 03/16/18 1200 03/16/18 1245 03/16/18 1300  BP: [name])  108/56 118/62 (!) 118/56   Pulse:  (!) 53 (!) 58   Resp: 16 18 18    Temp:      TempSrc:      SpO2: 97% 100% 95%   Weight:    91.6 kg  Height:    6\' 4"  (1.93 m)    Constitutional: NAD, calm, comfortable Eyes: PERRL, lids and conjunctivae normal ENMT: Mucous membranes are dry. Posterior pharynx clear of any exudate or lesions. Neck: normal, supple, no masses, no thyromegaly Respiratory: Clear to auscultation bilaterally, no wheezing, no crackles. Normal respiratory effort. No accessory muscle use.  Cardiovascular: bradycardic at 59 bpm with irregularly irregular rhythm, 2/6 systolic murmur, no rubs / gallops. No extremity edema. 2+ pedal pulses. No carotid bruits.  LUE AV fistula with good thrill. Abdomen: no tenderness, no masses palpated. No hepatosplenomegaly. Bowel sounds positive.  Musculoskeletal: no clubbing / cyanosis.  Distal right foot amputation.  Good ROM, no contractures. Normal muscle tone.  Skin: Multiple areas of ecchymosis on extremities. Neurologic: CN 2-12 grossly intact. Sensation intact, DTR normal.  Generalized weakness. Psychiatric: Somnolent, but answers questions.  Oriented x4.  Alert and oriented x 4.   Labs on Admission: I have personally reviewed following labs and imaging studies  CBC: Recent Labs  Lab 03/16/18 0801  WBC 3.4*  NEUTROABS 3.1  HGB 10.6*  HCT 33.5*  MCV 106.0*  PLT 50*   Basic Metabolic Panel: Recent Labs  Lab 03/16/18 0801  NA 144  K 3.7  CL 99  CO2 26  GLUCOSE 144*  BUN 78*  CREATININE 7.58*  CALCIUM 8.7*   GFR: Estimated Creatinine Clearance: 11.5 mL/min (A) (by C-G formula based on SCr of 7.58  mg/dL (H)). Liver Function Tests: Recent Labs  Lab 03/16/18 0801  AST 19  ALT 25  ALKPHOS 106  BILITOT 1.1  PROT 6.1*  ALBUMIN 3.7   No results for input(s): LIPASE, AMYLASE in the last 168 hours. No results for input(s): AMMONIA in the last 168 hours. Coagulation Profile: No results for input(s): INR, PROTIME in the last 168 hours. Cardiac Enzymes: No results for input(s): CKTOTAL, CKMB, CKMBINDEX, TROPONINI in the last 168 hours. BNP (last 3 results) No results for input(s): PROBNP in the last 8760 hours. HbA1C: No results for input(s): HGBA1C in the last 72 hours. CBG: No results for input(s): GLUCAP in the last 168 hours. Lipid Profile: No results for input(s): CHOL, HDL, LDLCALC, TRIG, CHOLHDL, LDLDIRECT in the last 72 hours. Thyroid Function Tests: No results for input(s): TSH, T4TOTAL, FREET4, T3FREE, THYROIDAB in the last 72 hours. Anemia Panel: No results for input(s): VITAMINB12, FOLATE, FERRITIN, TIBC, IRON, RETICCTPCT in the last 72 hours. Urine analysis:    Component Value Date/Time   COLORURINE YELLOW 11/01/2015 1917   APPEARANCEUR CLOUDY (A) 11/01/2015 1917   LABSPEC 1.012 11/01/2015 1917   PHURINE 7.0 11/01/2015 1917   GLUCOSEU NEGATIVE 11/01/2015 1917   HGBUR SMALL (A) 11/01/2015 1917   BILIRUBINUR NEGATIVE 11/01/2015 1917   KETONESUR NEGATIVE 11/01/2015 1917   PROTEINUR 100 (A) 11/01/2015 1917   UROBILINOGEN 0.2 07/28/2014 1007   NITRITE NEGATIVE 11/01/2015 1917   LEUKOCYTESUR LARGE (A) 11/01/2015 1917    Radiological Exams on Admission: Dg Chest Port 1 View  Result Date: 03/16/2018 CLINICAL DATA:  Multiple falls EXAM: PORTABLE CHEST 1 VIEW COMPARISON:  Portable exam 0803 hours compared to 04/18/2017 FINDINGS: Upper normal heart size post CABG. Mediastinal contours and pulmonary vascularity normal. RIGHT basilar atelectasis. Mild chronic elevation of RIGHT diaphragm.  Remaining lungs clear. No acute infiltrate, pleural effusion or pneumothorax.  Bones demineralized. IMPRESSION: RIGHT basilar atelectasis. Electronically Signed   By: Lavonia Dana M.D.   On: 03/16/2018 08:24    EKG: Independently reviewed.  Vent. rate 61 BPM PR interval * ms QRS duration 108 ms QT/QTc 544/516 ms P-R-T axes * 6 101 bradycardia with irregularity Repol abnrm suggests ischemia, anterolateral Prolonged QT interval  Assessment/Plan Principal Problem:   HCAP (healthcare-associated pneumonia) Admit to telemetry/inpatient. Continue supplemental oxygen. Continue cefepime and vancomycin per pharmacy. Bronchodilators as needed. Check strep pneumoniae urinary antigen. Check Legionella pneumophila urinary antigen. Check sputum Gram stain, culture and sensitivity. Follow-up blood cultures and sensitivity.  Active Problems:   ANEMIA Monitor hematocrit and hemoglobin. Epogen per nephrology.    Hypotension Continue midodrine 10 mg p.o. 3 times daily.    Bilateral blindness Supportive care.    CAD in native artery Continue aspirin 81 mg p.o. daily. Holding atorvastatin.    End stage renal failure on dialysis [facility]) Will defer to nephrology for management.    Type 2 diabetes mellitus (HCC) Carbohydrate modified diet. CBG monitoring with regular insulin sliding scale    DVT prophylaxis: Heparin SQ. Code Status: Full code. Family Communication: His son was present in the ED room. Disposition Plan: Admit for IV antibiotics for 2 to 3 days. Consults called: Nephrology (Dr. Roney Jaffe). Admission status: Inpatient/telemetry.   Reubin Milan MD [facility]  If 7PM-7AM, please contact night-coverage [email or web address] Password [email or web address]  03/16/2018, 1:43 PM

## 2018-03-16 NOTE — ED Triage Notes (Signed)
Pt arrives from home via [facility] after family called due to multiple falls that occurred this morning. Pt reports having diarrhea that started yesterday and continued to this morning while pt was walking back and forth to the restroom he had 3 different falls- ems was called out and the third time was transported to ED.

## 2018-03-16 NOTE — ED Notes (Signed)
On arrival pt was incontinent of stool - sample was collected-pt was cleaned with soap and water. barrier cream applied. Clean briefs placed on patient.

## 2018-03-16 NOTE — ED Notes (Signed)
Pt was rolled to ensure he was clean and dry- pt remains clean and dry at this time, resting comfortably.

## 2018-03-16 NOTE — Progress Notes (Signed)
Pharmacy Antibiotic Note  [name] is a 67 y.o. male admitted on 03/16/2018 with pneumonia.  Pharmacy has been consulted for vancomycin dosing. Pt with Tmax 100.9 and WBC slightly low at 3.4. Pt with history of ESRD on HD.  Plan: Vancomycin 2gm IV x 1 then 1gm post-HD Renally adjust cefepime per MD F/u renal plans, C&S, clinical status and pre-HD vanc level when appropriate  Height: 6\' 4"  (193 cm) Weight: 202 lb (91.6 kg) IBW/kg (Calculated) : 86.8  Temp (24hrs), Avg:100.9 F (38.3 C), Min:100.9 F (38.3 C), Max:100.9 F (38.3 C)  Recent Labs  Lab 03/16/18 0801 03/16/18 0814  WBC 3.4*  --   CREATININE 7.58*  --   LATICACIDVEN  --  1.33    Estimated Creatinine Clearance: 11.5 mL/min (A) (by C-G formula based on SCr of 7.58 mg/dL (H)).    Allergies  Allergen Reactions  . Codeine Other (See Comments)     [place]  . Tape Other (See Comments)    Plastic tape tears skin off, please use paper tape instead.    Antimicrobials this admission: Cefepime 12/13>> Vanc 12/13>> CTX x 1 12/13  Dose adjustments this admission: N/A  Microbiology results: Pending  Thank you for allowing pharmacy to be a part of this patient's care.  [name], [name] 03/16/2018 1:47 PM

## 2018-03-16 NOTE — ED Notes (Signed)
Repeat ekg given to MD Sabra Heck due to bradycardia and irregular.

## 2018-03-17 DIAGNOSIS — D61818 Other pancytopenia: Secondary | ICD-10-CM | POA: Diagnosis present

## 2018-03-17 LAB — COMPREHENSIVE METABOLIC PANEL
ALT: 23 U/L (ref 0–44)
AST: 21 U/L (ref 15–41)
Albumin: 3.1 g/dL — ABNORMAL LOW (ref 3.5–5.0)
Alkaline Phosphatase: 96 U/L (ref 38–126)
Anion gap: 16 — ABNORMAL HIGH (ref 5–15)
BUN: 92 mg/dL — ABNORMAL HIGH (ref 8–23)
CO2: 25 mmol/L (ref 22–32)
Calcium: 8.7 mg/dL — ABNORMAL LOW (ref 8.9–10.3)
Chloride: 103 mmol/L (ref 98–111)
Creatinine, Ser: 8.93 mg/dL — ABNORMAL HIGH (ref 0.61–1.24)
GFR calc Af Amer: 6 mL/min — ABNORMAL LOW (ref >60)
GFR calc non Af Amer: 5 mL/min — ABNORMAL LOW (ref >60)
GLUCOSE: 108 mg/dL — AB (ref 70–99)
Potassium: 3.9 mmol/L (ref 3.5–5.1)
Sodium: 144 mmol/L (ref 135–145)
Total Bilirubin: 0.9 mg/dL (ref 0.3–1.2)
Total Protein: 5.7 g/dL — ABNORMAL LOW (ref 6.5–8.1)

## 2018-03-17 LAB — CBC WITH DIFFERENTIAL/PLATELET
Abs Immature Granulocytes: 0.01 10*3/uL (ref 0.00–0.07)
BASOS ABS: 0 10*3/uL (ref 0.0–0.1)
Basophils Relative: 0 %
Eosinophils Absolute: 0 10*3/uL (ref 0.0–0.5)
Eosinophils Relative: 0 %
HCT: 31 % — ABNORMAL LOW (ref 39.0–52.0)
Hemoglobin: 9.8 g/dL — ABNORMAL LOW (ref 13.0–17.0)
Immature Granulocytes: 1 %
Lymphocytes Relative: 7 %
Lymphs Abs: 0.2 10*3/uL — ABNORMAL LOW (ref 0.7–4.0)
MCH: 33.1 pg (ref 26.0–34.0)
MCHC: 31.6 g/dL (ref 30.0–36.0)
MCV: 104.7 fL — ABNORMAL HIGH (ref 80.0–100.0)
Monocytes Absolute: 0.3 10*3/uL (ref 0.1–1.0)
Monocytes Relative: 13 %
Neutro Abs: 1.6 10*3/uL — ABNORMAL LOW (ref 1.7–7.7)
Neutrophils Relative %: 79 %
PLATELETS: 46 10*3/uL — AB (ref 150–400)
RBC: 2.96 MIL/uL — ABNORMAL LOW (ref 4.22–5.81)
RDW: 13 % (ref 11.5–15.5)
WBC: 2.1 10*3/uL — ABNORMAL LOW (ref 4.0–10.5)
nRBC: 0 % (ref 0.0–0.2)

## 2018-03-17 LAB — GLUCOSE, CAPILLARY
GLUCOSE-CAPILLARY: 88 mg/dL (ref 70–99)
Glucose-Capillary: 89 mg/dL (ref 70–99)

## 2018-03-17 LAB — HIV ANTIBODY (ROUTINE TESTING W REFLEX): HIV SCREEN 4TH GENERATION: NONREACTIVE

## 2018-03-17 MED ORDER — SODIUM CHLORIDE 0.9 % IV SOLN
2.0000 g | INTRAVENOUS | Status: DC
  Administered 2018-03-17 – 2018-03-21 (×3): 2 g via INTRAVENOUS
  Filled 2018-03-17 (×4): qty 2

## 2018-03-17 MED ORDER — MIDODRINE HCL 5 MG PO TABS
ORAL_TABLET | ORAL | Status: AC
  Administered 2018-03-17: 10 mg via ORAL
  Filled 2018-03-17: qty 2

## 2018-03-17 MED ORDER — PANTOPRAZOLE SODIUM 40 MG PO TBEC
40.0000 mg | DELAYED_RELEASE_TABLET | Freq: Two times a day (BID) | ORAL | Status: DC
  Administered 2018-03-17 – 2018-03-22 (×9): 40 mg via ORAL
  Filled 2018-03-17 (×9): qty 1

## 2018-03-17 MED ORDER — VANCOMYCIN HCL IN DEXTROSE 1-5 GM/200ML-% IV SOLN
1000.0000 mg | INTRAVENOUS | Status: DC
  Administered 2018-03-17 – 2018-03-19 (×2): 1000 mg via INTRAVENOUS
  Filled 2018-03-17 (×2): qty 200

## 2018-03-17 NOTE — Procedures (Signed)
0650 - pt arrived to unit, skin hot to touch. 2527 - Temp 101.5 oral, notified Dr. Jonnie Finner.  Also updated regarding pre weight 90.2 kg (edw 90 kg).  Per Dr. Jonnie Finner, initiated HD trmt as ordered, and keep UF even.   0710 - Midodrine 10 mg given (scheduled dose) 1292 - trmt initiated per policy and without difficulty. 0930 - Unable to UF patient, Dr. Jonnie Finner aware, no new orders. 1125 - Trmt completed 9090 - Patient with frequent moaning, grunt/groaning throughout trmt, unable to answer pain location.

## 2018-03-17 NOTE — Progress Notes (Signed)
Patient ID: [name], male   DOB: February 08, 1951, 67 y.o.   MRN: [id number]  PROGRESS NOTE    [name]  KPT:[id number] DOB: 1950-09-11 DOA: 03/16/2018 PCP: [name], MD   Brief Narrative:  [name] is a 67 y.o. male with medical history significant of anemia, osteoarthritis, bilateral eye blindness, CAD/CABG, history of cellulitis to lower extremities multiple times, colon polyps, diabetic peripheral neuropathy, type 2 diabetes, ESRD on hemodialysis, history of fall, hyperlipidemia, hypertension, urolithiasis, prostate cancer, peripheral vascular disease, history of pneumonia, renal cell carcinoma of both kidneys who was brought to the emergency department due to having multiple falls and 3 episodes of diarrhea since yesterday.  He also had a fever and has been having cough, which he describes as nonproductive, but also has been having dyspnea, rhonchi and wheezing.  Patient was diagnosed with healthcare associated pneumonia and was admitted for treatment.  Assessment & Plan:   Principal Problem:   HCAP (healthcare-associated pneumonia) Active Problems:   ANEMIA   Hypotension   Bilateral blindness   CAD in native artery   End stage renal failure on dialysis [name])   Type 2 diabetes mellitus ([name])   Other pancytopenia ([name])   #1 Healthcare associated pneumonia: Patient currently on triple antibiotics.  Vancomycin as well as cefepime.  He still has a fever of 101 this morning.  Blood cultures and sputum cultures still pending.  Follow closely and adjust as necessary.  #2 end-stage renal disease: Patient on hemodialysis.  He has been dialyzed today.  #3 hypotension: He is on Middaugh drain 10 mg 3 times daily.  Also given with his hemodialysis.  #3 anemia of chronic disease: H&H appears stable.  Continue Epogen  #4 coronary artery disease: Stable at baseline.  #5 blindness: Patient is blind bilaterally.  He is however with it and communicating.  #6 diabetes: Blood  sugar appears well controlled.  Continue close monitoring  #7 pancytopenia: Patient may be immunocompromise.  This could be related to sepsis.  Continue follow-up at stepdown unit.     DVT prophylaxis: SCD Code Status: Full code Family Communication: Son at bedside Disposition Plan: Home when ready  Consultants:   Dr. Lorrin Jackson of nephrology  Procedures:   None   Antimicrobials: Vancomycin and cefepime day #1   Subjective: Patient is currently stable after dialysis.  He has no new complaints.  His eating and drinking well but still weak.  Slightly drowsy.  Objective: Vitals:   03/17/18 0800 03/17/18 0830 03/17/18 0900 03/17/18 0930  BP: (!) 123/57 (!) 125/38 (!) 101/48 95/61  Pulse: 72 74 74 74  Resp: (!) 22 (!) 23 (!) 24 (!) 25  Temp:      TempSrc:      SpO2: 92% 90% 94% 94%  Weight:      Height:        Intake/Output Summary (Last 24 hours) at 03/17/2018 0939 Last data filed at 03/16/2018 1508 Gross per 24 hour  Intake 250 ml  Output -  Net 250 ml   Filed Weights   03/16/18 1300 03/16/18 1700 03/17/18 0700  Weight: 91.6 kg 92.2 kg 90.2 kg    Examination:  General exam: Appears calm and comfortable, drowsy, blind Respiratory system: Clear to auscultation. Respiratory effort normal. Cardiovascular system: S1 & S2 heard, RRR. No JVD, murmurs, rubs, gallops or clicks. No pedal edema. Gastrointestinal system: Abdomen is nondistended, soft and nontender. No organomegaly or masses felt. Normal bowel sounds heard. Central nervous system: Alert and oriented.  No focal neurological deficits. Extremities: Symmetric 5 x 5 power.  Status post right forefoot amputation Skin: No rashes, lesions or ulcers Psychiatry: Judgement and insight appear normal. Mood & affect appropriate.     Data Reviewed: I have personally reviewed following labs and imaging studies  CBC: Recent Labs  Lab 03/16/18 0801 03/17/18 0422  WBC 3.4* 2.1*  NEUTROABS 3.1 1.6*  HGB 10.6* 9.8*    HCT 33.5* 31.0*  MCV 106.0* 104.7*  PLT 50* 46*   Basic Metabolic Panel: Recent Labs  Lab 03/16/18 0801 03/17/18 0422  NA 144 144  K 3.7 3.9  CL 99 103  CO2 26 25  GLUCOSE 144* 108*  BUN 78* 92*  CREATININE 7.58* 8.93*  CALCIUM 8.7* 8.7*   GFR: Estimated Creatinine Clearance: 9.7 mL/min (A) (by C-G formula based on SCr of 8.93 mg/dL (H)). Liver Function Tests: Recent Labs  Lab 03/16/18 0801 03/17/18 0422  AST 19 21  ALT 25 23  ALKPHOS 106 96  BILITOT 1.1 0.9  PROT 6.1* 5.7*  ALBUMIN 3.7 3.1*   No results for input(s): LIPASE, AMYLASE in the last 168 hours. No results for input(s): AMMONIA in the last 168 hours. Coagulation Profile: No results for input(s): INR, PROTIME in the last 168 hours. Cardiac Enzymes: No results for input(s): CKTOTAL, CKMB, CKMBINDEX, TROPONINI in the last 168 hours. BNP (last 3 results) No results for input(s): PROBNP in the last 8760 hours. HbA1C: No results for input(s): HGBA1C in the last 72 hours. CBG: Recent Labs  Lab 03/16/18 2310  GLUCAP 118*   Lipid Profile: No results for input(s): CHOL, HDL, LDLCALC, TRIG, CHOLHDL, LDLDIRECT in the last 72 hours. Thyroid Function Tests: No results for input(s): TSH, T4TOTAL, FREET4, T3FREE, THYROIDAB in the last 72 hours. Anemia Panel: No results for input(s): VITAMINB12, FOLATE, FERRITIN, TIBC, IRON, RETICCTPCT in the last 72 hours. Sepsis Labs: Recent Labs  Lab 03/16/18 6010  LATICACIDVEN 1.33    Recent Results (from the past 240 hour(s))  C difficile quick scan w PCR reflex     Status: None   Collection Time: 03/16/18  8:06 AM  Result Value Ref Range Status   C Diff antigen NEGATIVE NEGATIVE Final   C Diff toxin NEGATIVE NEGATIVE Final   C Diff interpretation No C. difficile detected.  Final    Comment: Performed at [facility], [place] [address], [place], [place] [address]  MRSA PCR Screening     Status: None   Collection Time: 03/16/18  5:45 PM  Result Value Ref Range  Status   MRSA by PCR NEGATIVE NEGATIVE Final    Comment:        The GeneXpert MRSA Assay (FDA approved for NASAL specimens only), is one component of a comprehensive MRSA colonization surveillance program. It is not intended to diagnose MRSA infection nor to guide or monitor treatment for MRSA infections. Performed at [facility], [place] [address], [place],  [address]          Radiology Studies: Dg Chest Port 1 View  Result Date: 03/16/2018 CLINICAL DATA:  Multiple falls EXAM: PORTABLE CHEST 1 VIEW COMPARISON:  Portable exam 0803 hours compared to 04/18/2017 FINDINGS: Upper normal heart size post CABG. Mediastinal contours and pulmonary vascularity normal. RIGHT basilar atelectasis. Mild chronic elevation of RIGHT diaphragm. Remaining lungs clear. No acute infiltrate, pleural effusion or pneumothorax. Bones demineralized. IMPRESSION: RIGHT basilar atelectasis. Electronically Signed   By: Lavonia Dana M.D.   On: 03/16/2018 08:24  Scheduled Meds: . aspirin EC  81 mg Oral QHS  . Chlorhexidine Gluconate Cloth  6 each Topical Q0600  . insulin aspart  0-9 Units Subcutaneous TID WC  . midodrine  10 mg Oral TID WC  . multivitamin  1 tablet Oral Daily  . sevelamer carbonate  4,800 mg Oral TID WC   Continuous Infusions: . [START ON 03/19/2018] ceFEPime (MAXIPIME) IV    . [START ON 03/19/2018] vancomycin       LOS: 1 day    Time spent: 72 minutes    Nadiyah Zeis,LAWAL, MD [facility] Pager [phone] If 7PM-7AM, please contact night-coverage [email or web address] Password [facility] 03/17/2018, 9:39 AM

## 2018-03-17 NOTE — Progress Notes (Signed)
Supply is out of SCDs. Will notify secretary whenever a unit becomes available

## 2018-03-17 NOTE — Progress Notes (Signed)
[facility] Progress Note  Subjective: no new c/o's, lethargic but responds approp. Pt on HD  Vitals:   03/17/18 1000 03/17/18 1030 03/17/18 1100 03/17/18 1225  BP: (!) 95/50 (!) 88/44 (!) 91/38 (!) 109/50  Pulse: 71 61 67 64  Resp: (!) 21 (!) 24 (!) 22 (!) 22  Temp:    (!) 100.5 F (38.1 C)  TempSrc:    Oral  SpO2: 94% 94% 99% 95%  Weight:      Height:        Inpatient medications: . aspirin EC  81 mg Oral QHS  . Chlorhexidine Gluconate Cloth  6 each Topical Q0600  . insulin aspart  0-9 Units Subcutaneous TID WC  . midodrine  10 mg Oral TID WC  . multivitamin  1 tablet Oral Daily  . sevelamer carbonate  4,800 mg Oral TID WC   . ceFEPime (MAXIPIME) IV    . vancomycin     acetaminophen **OR** acetaminophen, ondansetron **OR** ondansetron (ZOFRAN) IV, sevelamer carbonate  Iron/TIBC/Ferritin/ %Sat    Component Value Date/Time   IRON 26 (L) 04/12/2016 0657   TIBC 200 (L) 04/12/2016 0657   FERRITIN 1,350 (H) 04/12/2016 0657   IRONPCTSAT 13 (L) 04/12/2016 4656    Exam: Gen alert, frail older adult male not in distress, frail w/ musc loss No jvd or bruits Chest bilat coarse rhonchi, no wheezing RRR no MRG +sternotomy scar Abd soft ntnd no mass or ascites +bs Ext R TMA, no LE edema, no wounds or ulcers Neuro is groggy but arouses and Ox 2, answers questions L AVF+bruit   Home meds:  - aspirin 81/ atorvastatin 10/ MVI/ sevelamer carb ac tid  - midodrine 10 tid   Dialysis: MWF [facility]  4h  450/800   90kg  No heparin  LUE AVF  - vit D 2.5 ug tiw  -parsabiv 15 tiw IV  - midodrine 10 tid  - last Hb 11.6     Impression/ Plan: 1. Fevers/ PNA - per primary team 2. ESRD on HD: usual HD MWF.  HD today then resume MWF.   3. Volume - stable, no excess. CXR nad. No UF on HD today 4. PAD sp TMA 5. H/o renal cell cancer 6. MBD ckd - cont meds 7. Anemia ckd - not on esa at clinic 8. DM2 - not on medication it appears 9. Chron hypotension - on  midodrine tid   Kelly Splinter MD [place] pager [phone]   03/17/2018, 12:41 PM   Recent Labs  Lab 03/16/18 0801 03/17/18 0422  NA 144 144  K 3.7 3.9  CL 99 103  CO2 26 25  GLUCOSE 144* 108*  BUN 78* 92*  CREATININE 7.58* 8.93*  CALCIUM 8.7* 8.7*  ALBUMIN 3.7 3.1*   Recent Labs  Lab 03/16/18 0801 03/17/18 0422  AST 19 21  ALT 25 23  ALKPHOS 106 96  BILITOT 1.1 0.9  PROT 6.1* 5.7*   Recent Labs  Lab 03/16/18 0801 03/17/18 0422  WBC 3.4* 2.1*  NEUTROABS 3.1 1.6*  HGB 10.6* 9.8*  HCT 33.5* 31.0*  MCV 106.0* 104.7*  PLT 50* 46*

## 2018-03-18 ENCOUNTER — Encounter (HOSPITAL_COMMUNITY): Payer: Self-pay | Admitting: *Deleted

## 2018-03-18 LAB — CBC WITH DIFFERENTIAL/PLATELET
Abs Immature Granulocytes: 0.02 10*3/uL (ref 0.00–0.07)
Basophils Absolute: 0 10*3/uL (ref 0.0–0.1)
Basophils Relative: 0 %
Eosinophils Absolute: 0 10*3/uL (ref 0.0–0.5)
Eosinophils Relative: 0 %
HCT: 29.7 % — ABNORMAL LOW (ref 39.0–52.0)
Hemoglobin: 9.3 g/dL — ABNORMAL LOW (ref 13.0–17.0)
Immature Granulocytes: 1 %
Lymphocytes Relative: 5 %
Lymphs Abs: 0.2 10*3/uL — ABNORMAL LOW (ref 0.7–4.0)
MCH: 32.7 pg (ref 26.0–34.0)
MCHC: 31.3 g/dL (ref 30.0–36.0)
MCV: 104.6 fL — AB (ref 80.0–100.0)
Monocytes Absolute: 0.4 10*3/uL (ref 0.1–1.0)
Monocytes Relative: 11 %
NEUTROS ABS: 2.9 10*3/uL (ref 1.7–7.7)
Neutrophils Relative %: 83 %
PLATELETS: 56 10*3/uL — AB (ref 150–400)
RBC: 2.84 MIL/uL — ABNORMAL LOW (ref 4.22–5.81)
RDW: 13.2 % (ref 11.5–15.5)
WBC: 3.5 10*3/uL — ABNORMAL LOW (ref 4.0–10.5)
nRBC: 0 % (ref 0.0–0.2)

## 2018-03-18 LAB — GLUCOSE, CAPILLARY
GLUCOSE-CAPILLARY: 72 mg/dL (ref 70–99)
Glucose-Capillary: 91 mg/dL (ref 70–99)

## 2018-03-18 MED ORDER — CHLORHEXIDINE GLUCONATE CLOTH 2 % EX PADS: 6.0000 | MEDICATED_PAD | Freq: Every day | CUTANEOUS | Status: DC

## 2018-03-18 NOTE — Progress Notes (Signed)
[facility] Progress Note  Subjective: is more alert this am, no new c/o's   Vitals:   03/17/18 2357 03/18/18 0408 03/18/18 0729 03/18/18 1138  BP:  (!) 110/48 (!) 88/50 (!) 93/46  Pulse:   (!) 56 (!) 56  Resp:   17 14  Temp: (!) 100.5 F (38.1 C) (!) 100.5 F (38.1 C) 99.3 F (37.4 C) 99.4 F (37.4 C)  TempSrc: Oral Oral Oral Oral  SpO2:   93% 95%  Weight:      Height:        Inpatient medications: . aspirin EC  81 mg Oral QHS  . Chlorhexidine Gluconate Cloth  6 each Topical Q0600  . insulin aspart  0-9 Units Subcutaneous TID WC  . midodrine  10 mg Oral TID WC  . multivitamin  1 tablet Oral Daily  . pantoprazole  40 mg Oral BID AC  . sevelamer carbonate  4,800 mg Oral TID WC   . ceFEPime (MAXIPIME) IV 2 g (03/17/18 2053)  . vancomycin 1,000 mg (03/17/18 1248)   acetaminophen **OR** acetaminophen, ondansetron **OR** ondansetron (ZOFRAN) IV, sevelamer carbonate  Iron/TIBC/Ferritin/ %Sat    Component Value Date/Time   IRON 26 (L) 04/12/2016 0657   TIBC 200 (L) 04/12/2016 0657   FERRITIN 1,350 (H) 04/12/2016 0657   IRONPCTSAT 13 (L) 04/12/2016 6659    Exam: Gen alert, frail older adult male not in distress, frail w/ musc loss No jvd or bruits Chest bilat coarse rhonchi, no wheezing RRR no MRG +sternotomy scar Abd soft ntnd no mass or ascites +bs Ext R TMA, no LE edema, no wounds or ulcers Neuro is groggy but arouses and Ox 2, answers questions L AVF+bruit   Home meds:  - aspirin 81/ atorvastatin 10/ MVI/ sevelamer carb ac tid  - midodrine 10 tid   Dialysis: MWF [facility]  4h  450/800   90kg  No heparin  LUE AVF  - vit D 2.5 ug tiw  -parsabiv 15 tiw IV  - midodrine 10 tid  - last Hb 11.6     Impression/ Plan:  Fevers/ PNA - improving. Looks much better today. On abx, temps coming down. Per primary   ESRD on HD: usual HD MWF.  HD Monday  Volume - stable, no excess. CXR no edema.   PAD sp TMA  H/o renal cell cancer  MBD ckd -  cont meds  Anemia ckd - not on esa at clinic, Hb 9's here  DM2 - not on medication now it appears  Chron hypotension - on midodrine tid  Blindness  Chronic debility - looks bedbound but not sure   Kelly Splinter MD [facility] pager [phone]   03/18/2018, 4:06 PM   Recent Labs  Lab 03/16/18 0801 03/17/18 0422  NA 144 144  K 3.7 3.9  CL 99 103  CO2 26 25  GLUCOSE 144* 108*  BUN 78* 92*  CREATININE 7.58* 8.93*  CALCIUM 8.7* 8.7*  ALBUMIN 3.7 3.1*   Recent Labs  Lab 03/16/18 0801 03/17/18 0422  AST 19 21  ALT 25 23  ALKPHOS 106 96  BILITOT 1.1 0.9  PROT 6.1* 5.7*   Recent Labs  Lab 03/17/18 0422 03/18/18 0521  WBC 2.1* 3.5*  NEUTROABS 1.6* 2.9  HGB 9.8* 9.3*  HCT 31.0* 29.7*  MCV 104.7* 104.6*  PLT 46* 56*

## 2018-03-18 NOTE — Progress Notes (Signed)
Patient ID: [name], male   DOB: 04/18/1950, 67 y.o.   MRN: [id number]  PROGRESS NOTE    [name]  YTK:[id number] DOB: 02/27/1951 DOA: 03/16/2018 PCP: [name], MD   Brief Narrative:  [name] is a 67 y.o. male with medical history significant of anemia, osteoarthritis, bilateral eye blindness, CAD/CABG, history of cellulitis to lower extremities multiple times, colon polyps, diabetic peripheral neuropathy, type 2 diabetes, ESRD on hemodialysis, history of fall, hyperlipidemia, hypertension, urolithiasis, prostate cancer, peripheral vascular disease, history of pneumonia, renal cell carcinoma of both kidneys who was brought to the emergency department due to having multiple falls and 3 episodes of diarrhea since yesterday.  He also had a fever and has been having cough, which he describes as nonproductive, but also has been having dyspnea, rhonchi and wheezing.  Patient was diagnosed with healthcare associated pneumonia and was admitted for treatment.  Assessment & Plan:   Principal Problem:   HCAP (healthcare-associated pneumonia) Active Problems:   ANEMIA   Hypotension   Bilateral blindness   CAD in native artery   End stage renal failure on dialysis [name])   Type 2 diabetes mellitus ([place])   Other pancytopenia ([place])   #1 Healthcare associated pneumonia: Patient is still having evidence of pneumonia.  He is still breathing hard but slowly improved.  Able to wake up and communicate.  Patient currently on Vancomycin as well as cefepime.  Cultures are pending.  Follow closely and adjust as necessary.  #2 end-stage renal disease: Patient on hemodialysis.  Continue hemodialysis per nephrology  #3 hypotension: He is on Midodrin 10 mg 3 times daily.  Also given with his hemodialysis.  Blood pressure is much improved today  #3 anemia of chronic disease: H&H appears stable.  Continue Epogen  #4 coronary artery disease: Stable at baseline.  #5 blindness: Patient is  blind bilaterally.  He is however with it and communicating.  #6 diabetes: Blood sugar appears well controlled.  Continue close monitoring  #7 pancytopenia: Patient may be immunocompromise.  This could be related to sepsis.  Continue follow-up at stepdown unit.     DVT prophylaxis: SCD Code Status: Full code Family Communication: Son at bedside Disposition Plan: Home when ready  Consultants:   Dr. Lorrin Jackson of nephrology  Procedures:   None   Antimicrobials: Vancomycin and cefepime day #1   Subjective: Patient is currently slightly improved and more awake today.  No new complaints  Objective: Vitals:   03/17/18 1949 03/17/18 2357 03/18/18 0408 03/18/18 0729  BP: (!) 99/50  (!) 110/48 (!) 88/50  Pulse:    (!) 56  Resp:    17  Temp: (!) 101.1 F (38.4 C) (!) 100.5 F (38.1 C) (!) 100.5 F (38.1 C) 99.3 F (37.4 C)  TempSrc: Oral Oral Oral Oral  SpO2: 96%   93%  Weight:      Height:        Intake/Output Summary (Last 24 hours) at 03/18/2018 1024 Last data filed at 03/18/2018 0500 Gross per 24 hour  Intake 420 ml  Output -329 ml  Net 749 ml   Filed Weights   03/16/18 1700 03/17/18 0700 03/17/18 1152  Weight: 92.2 kg 90.2 kg 89.2 kg    Examination:  General exam: Appears calm and comfortable, drowsy, blind Respiratory system: Diffuse rhonchi and crackles bilaterally respiratory effort normal. Cardiovascular system: S1 & S2 heard, RRR. No JVD, murmurs, rubs, gallops or clicks. No pedal edema. Gastrointestinal system: Abdomen is nondistended, soft and  nontender. No organomegaly or masses felt. Normal bowel sounds heard. Central nervous system: Alert and oriented. No focal neurological deficits. Extremities: Symmetric 5 x 5 power.  Status post right forefoot amputation Skin: No rashes, lesions or ulcers Psychiatry: Judgement and insight appear normal. Mood & affect appropriate.     Data Reviewed: I have personally reviewed following labs and imaging  studies  CBC: Recent Labs  Lab 03/16/18 0801 03/17/18 0422 03/18/18 0521  WBC 3.4* 2.1* 3.5*  NEUTROABS 3.1 1.6* 2.9  HGB 10.6* 9.8* 9.3*  HCT 33.5* 31.0* 29.7*  MCV 106.0* 104.7* 104.6*  PLT 50* 46* 56*   Basic Metabolic Panel: Recent Labs  Lab 03/16/18 0801 03/17/18 0422  NA 144 144  K 3.7 3.9  CL 99 103  CO2 26 25  GLUCOSE 144* 108*  BUN 78* 92*  CREATININE 7.58* 8.93*  CALCIUM 8.7* 8.7*   GFR: Estimated Creatinine Clearance: 9.7 mL/min (A) (by C-G formula based on SCr of 8.93 mg/dL (H)). Liver Function Tests: Recent Labs  Lab 03/16/18 0801 03/17/18 0422  AST 19 21  ALT 25 23  ALKPHOS 106 96  BILITOT 1.1 0.9  PROT 6.1* 5.7*  ALBUMIN 3.7 3.1*   No results for input(s): LIPASE, AMYLASE in the last 168 hours. No results for input(s): AMMONIA in the last 168 hours. Coagulation Profile: No results for input(s): INR, PROTIME in the last 168 hours. Cardiac Enzymes: No results for input(s): CKTOTAL, CKMB, CKMBINDEX, TROPONINI in the last 168 hours. BNP (last 3 results) No results for input(s): PROBNP in the last 8760 hours. HbA1C: No results for input(s): HGBA1C in the last 72 hours. CBG: Recent Labs  Lab 03/16/18 2310 03/17/18 1726 03/17/18 2136 03/18/18 0902  GLUCAP 118* 89 88 72   Lipid Profile: No results for input(s): CHOL, HDL, LDLCALC, TRIG, CHOLHDL, LDLDIRECT in the last 72 hours. Thyroid Function Tests: No results for input(s): TSH, T4TOTAL, FREET4, T3FREE, THYROIDAB in the last 72 hours. Anemia Panel: No results for input(s): VITAMINB12, FOLATE, FERRITIN, TIBC, IRON, RETICCTPCT in the last 72 hours. Sepsis Labs: Recent Labs  Lab 03/16/18 4627  LATICACIDVEN 1.33    Recent Results (from the past 240 hour(s))  Blood Culture (routine x 2)     Status: None (Preliminary result)   Collection Time: 03/16/18  8:01 AM  Result Value Ref Range Status   Specimen Description RIGHT ANTECUBITAL IV  Final   Special Requests   Final    BOTTLES DRAWN  AEROBIC AND ANAEROBIC Blood Culture results may not be optimal due to an inadequate volume of blood received in culture bottles   Culture   Final    NO GROWTH 1 DAY Performed at [facility], [place] [address] [place], [place] [address]    Report Status PENDING  Incomplete  C difficile quick scan w PCR reflex     Status: None   Collection Time: 03/16/18  8:06 AM  Result Value Ref Range Status   C Diff antigen NEGATIVE NEGATIVE Final   C Diff toxin NEGATIVE NEGATIVE Final   C Diff interpretation No C. difficile detected.  Final    Comment: Performed at [facility], [place] [address] [place], [place] [address]  MRSA PCR Screening     Status: None   Collection Time: 03/16/18  5:45 PM  Result Value Ref Range Status   MRSA by PCR NEGATIVE NEGATIVE Final    Comment:        The GeneXpert MRSA Assay (FDA approved  for NASAL specimens only), is one component of a comprehensive MRSA colonization surveillance program. It is not intended to diagnose MRSA infection nor to guide or monitor treatment for MRSA infections. Performed at [facility], [place] [address], [place], [place] [address]   Blood Culture (routine x 2)     Status: None (Preliminary result)   Collection Time: 03/16/18  6:03 PM  Result Value Ref Range Status   Specimen Description BLOOD RIGHT HAND  Final   Special Requests   Final    BOTTLES DRAWN AEROBIC AND ANAEROBIC Blood Culture adequate volume   Culture   Final    NO GROWTH < 24 HOURS Performed at [facility], [place] [address] [place], [place] [address]    Report Status PENDING  Incomplete         Radiology Studies: No results found.      Scheduled Meds: . aspirin EC  81 mg Oral QHS  . Chlorhexidine Gluconate Cloth  6 each Topical Q0600  . insulin aspart  0-9 Units Subcutaneous TID WC  . midodrine  10 mg Oral TID WC  . multivitamin  1 tablet Oral Daily  . pantoprazole  40 mg Oral BID AC  . sevelamer carbonate  4,800 mg Oral TID WC    Continuous Infusions: . ceFEPime (MAXIPIME) IV 2 g (03/17/18 2053)  . vancomycin 1,000 mg (03/17/18 1248)     LOS: 2 days    Time spent: 37 minutes    Miasia Crabtree,LAWAL, MD [facility] Pager [phone] If 7PM-7AM, please contact night-coverage [email or web address] Password [facility] 03/18/2018, 10:24 AM

## 2018-03-19 DIAGNOSIS — Z992 Dependence on renal dialysis: Secondary | ICD-10-CM

## 2018-03-19 DIAGNOSIS — H543 Unqualified visual loss, both eyes: Secondary | ICD-10-CM

## 2018-03-19 DIAGNOSIS — I959 Hypotension, unspecified: Secondary | ICD-10-CM

## 2018-03-19 DIAGNOSIS — N186 End stage renal disease: Secondary | ICD-10-CM

## 2018-03-19 DIAGNOSIS — I2510 Atherosclerotic heart disease of native coronary artery without angina pectoris: Secondary | ICD-10-CM

## 2018-03-19 LAB — RENAL FUNCTION PANEL
Albumin: 2.8 g/dL — ABNORMAL LOW (ref 3.5–5.0)
Anion gap: 16 — ABNORMAL HIGH (ref 5–15)
BUN: 70 mg/dL — ABNORMAL HIGH (ref 8–23)
CHLORIDE: 98 mmol/L (ref 98–111)
CO2: 23 mmol/L (ref 22–32)
Calcium: 8.4 mg/dL — ABNORMAL LOW (ref 8.9–10.3)
Creatinine, Ser: 6.95 mg/dL — ABNORMAL HIGH (ref 0.61–1.24)
GFR calc Af Amer: 9 mL/min — ABNORMAL LOW (ref >60)
GFR calc non Af Amer: 7 mL/min — ABNORMAL LOW (ref >60)
Glucose, Bld: 89 mg/dL (ref 70–99)
POTASSIUM: 4.5 mmol/L (ref 3.5–5.1)
Phosphorus: 4.4 mg/dL (ref 2.5–4.6)
Sodium: 137 mmol/L (ref 135–145)

## 2018-03-19 LAB — CBC WITH DIFFERENTIAL/PLATELET
Abs Immature Granulocytes: 0.02 10*3/uL (ref 0.00–0.07)
BASOS ABS: 0 10*3/uL (ref 0.0–0.1)
Basophils Relative: 0 %
Eosinophils Absolute: 0 10*3/uL (ref 0.0–0.5)
Eosinophils Relative: 0 %
HCT: 32.3 % — ABNORMAL LOW (ref 39.0–52.0)
Hemoglobin: 10 g/dL — ABNORMAL LOW (ref 13.0–17.0)
Immature Granulocytes: 1 %
Lymphocytes Relative: 7 %
Lymphs Abs: 0.2 10*3/uL — ABNORMAL LOW (ref 0.7–4.0)
MCH: 33.3 pg (ref 26.0–34.0)
MCHC: 31 g/dL (ref 30.0–36.0)
MCV: 107.7 fL — ABNORMAL HIGH (ref 80.0–100.0)
Monocytes Absolute: 0.3 10*3/uL (ref 0.1–1.0)
Monocytes Relative: 8 %
Neutro Abs: 2.8 10*3/uL (ref 1.7–7.7)
Neutrophils Relative %: 84 %
Platelets: 59 10*3/uL — ABNORMAL LOW (ref 150–400)
RBC: 3 MIL/uL — ABNORMAL LOW (ref 4.22–5.81)
RDW: 13.4 % (ref 11.5–15.5)
WBC Morphology: INCREASED
WBC: 3.3 10*3/uL — ABNORMAL LOW (ref 4.0–10.5)
nRBC: 0 % (ref 0.0–0.2)

## 2018-03-19 LAB — GLUCOSE, CAPILLARY
Glucose-Capillary: 117 mg/dL — ABNORMAL HIGH (ref 70–99)
Glucose-Capillary: 122 mg/dL — ABNORMAL HIGH (ref 70–99)
Glucose-Capillary: 75 mg/dL (ref 70–99)
Glucose-Capillary: 78 mg/dL (ref 70–99)
Glucose-Capillary: 103 mg/dL — ABNORMAL HIGH (ref 70–99)
Glucose-Capillary: 111 mg/dL — ABNORMAL HIGH (ref 70–99)
Glucose-Capillary: 139 mg/dL — ABNORMAL HIGH (ref 70–99)

## 2018-03-19 MED ORDER — GUAIFENESIN ER 600 MG PO TB12
1200.0000 mg | ORAL_TABLET | Freq: Two times a day (BID) | ORAL | Status: DC
  Administered 2018-03-19 – 2018-03-22 (×7): 1200 mg via ORAL
  Filled 2018-03-19 (×7): qty 2

## 2018-03-19 MED ORDER — VANCOMYCIN HCL IN DEXTROSE 1-5 GM/200ML-% IV SOLN
INTRAVENOUS | Status: AC
  Administered 2018-03-19: 1000 mg via INTRAVENOUS
  Filled 2018-03-19: qty 200

## 2018-03-19 MED ORDER — MIDODRINE HCL 5 MG PO TABS
ORAL_TABLET | ORAL | Status: AC
  Administered 2018-03-19: 10 mg via ORAL
  Filled 2018-03-19: qty 2

## 2018-03-19 MED ORDER — MIDODRINE HCL 5 MG PO TABS
ORAL_TABLET | ORAL | Status: AC
  Filled 2018-03-19: qty 2

## 2018-03-19 NOTE — Progress Notes (Signed)
[facility] Progress Note  Subjective: on HD , multiple c/o's no change  Vitals:   03/19/18 0930 03/19/18 1000 03/19/18 1030 03/19/18 1100  BP: (!) 115/49 (!) 108/51 (!) 106/47 (!) 105/42  Pulse: (!) 59 (!) 59 (!) 57 (!) 55  Resp:      Temp:      TempSrc:      SpO2:      Weight:      Height:        Inpatient medications: . aspirin EC  81 mg Oral QHS  . Chlorhexidine Gluconate Cloth  6 each Topical Q0600  . insulin aspart  0-9 Units Subcutaneous TID WC  . midodrine  10 mg Oral TID WC  . multivitamin  1 tablet Oral Daily  . pantoprazole  40 mg Oral BID AC  . sevelamer carbonate  4,800 mg Oral TID WC   . ceFEPime (MAXIPIME) IV 2 g (03/17/18 2053)  . vancomycin 1,000 mg (03/19/18 1021)   acetaminophen **OR** acetaminophen, ondansetron **OR** ondansetron (ZOFRAN) IV, sevelamer carbonate  Iron/TIBC/Ferritin/ %Sat    Component Value Date/Time   IRON 26 (L) 04/12/2016 0657   TIBC 200 (L) 04/12/2016 0657   FERRITIN 1,350 (H) 04/12/2016 0657   IRONPCTSAT 13 (L) 04/12/2016 4580    Exam: Gen alert, frail older adult male not in distress, frail w/ musc loss No jvd or bruits Chest clear bilat, no wheezing RRR no MRG +sternotomy scar Abd soft ntnd no mass or ascites +bs Ext R TMA, no LE edema, no wounds or ulcers Neuro awakens easily , gen'd weakness and deconditioning severe L AVF+bruit   Home meds:  - aspirin 81/ atorvastatin 10/ MVI/ sevelamer carb ac tid  - midodrine 10 tid   Dialysis: MWF [facility]  4h  450/800   90kg  No heparin  LUE AVF  - vit D 2.5 ug tiw  -parsabiv 15 tiw IV  - midodrine 10 tid  - last Hb 11.6     Impression/ Plan:  PNA - per primary.  Fevers resolving.   ESRD on HD: usual HD MWF.  HD today  Volume - stable, no excess. CXR no edema. Min UF on hd.   PAD sp TMA  H/o renal cell cancer  MBD ckd - cont meds  Anemia ckd - not on esa at clinic, Hb 9's here  Chron hypotension - on midodrine tid  Blindness  Chronic  debility - looks bedbound? Lives at home   [place] pager [phone]   03/19/2018, 11:08 AM   Recent Labs  Lab 03/17/18 0422 03/19/18 0720  NA 144 137  K 3.9 4.5  CL 103 98  CO2 25 23  GLUCOSE 108* 89  BUN 92* 70*  CREATININE 8.93* 6.95*  CALCIUM 8.7* 8.4*  PHOS  --  4.4  ALBUMIN 3.1* 2.8*   Recent Labs  Lab 03/16/18 0801 03/17/18 0422  AST 19 21  ALT 25 23  ALKPHOS 106 96  BILITOT 1.1 0.9  PROT 6.1* 5.7*   Recent Labs  Lab 03/18/18 0521 03/19/18 0335  WBC 3.5* 3.3*  NEUTROABS 2.9 2.8  HGB 9.3* 10.0*  HCT 29.7* 32.3*  MCV 104.6* 107.7*  PLT 56* 59*

## 2018-03-19 NOTE — Progress Notes (Signed)
Chaplain responded to spiritual care consult.  Patient may be interested in completing an Advanced Directive. Patient was asleep when chaplain came, and did not rouse when his name was said.  Will be available Wednesday. [employer] off-[employer] on Tuesday) [place] Pager [phone]

## 2018-03-19 NOTE — Progress Notes (Signed)
PROGRESS NOTE    [name]  PYK:[id number] DOB: Aug 09, 1950 DOA: 03/16/2018 PCP: [name], MD   Brief Narrative: [name] is a 67 y.o. male with medical history significant of anemia, osteoarthritis, bilateral eye blindness, CAD/CABG, history of cellulitis to lower extremities multiple times, colon polyps, diabetic peripheral neuropathy, type 2 diabetes, ESRD on hemodialysis, history of fall, hyperlipidemia, hypertension, urolithiasis, prostate cancer, peripheral vascular disease, renal cell carcinoma. He presented with falls and diarrhea. Admitted with concern for HCAP and treatment started. C. Difficile negative with blood cultures no growth to date.   Assessment & Plan:   Principal Problem:   HCAP (healthcare-associated pneumonia) Active Problems:   ANEMIA   Hypotension   Bilateral blindness   CAD in native artery   End stage renal failure on dialysis [facility])   Type 2 diabetes mellitus ([place])   Other pancytopenia ([place])   HCAP Presenting problem. Chest x-ray clear. Patient with symptoms of cough with minimal sputum production -Continue Cefepime -Discontinue Vancomycin (MRSA pcr negative)  ESRD Hemodialysis MWF -Per nephrology  Hypotension Secondary to hemodialysis. -Continue midodrine  Anemia of chronic disease In setting of kidney disease -Nephrology  CAD No chest pain. -Continue Lipitor and aspirin  Blindness Chronic per chart review.  Diabetes mellitus Mentioned on history. Not currently on medication. On chart review, no hemoglobin A1C above 6.0% -Continue SSI  Pancytopenia Patient with chronic anemia and thrombocytopenia. Leukopenia in setting of acute illness. Doubt bone marrow failure. -CBC   DVT prophylaxis: SCD Code Status:   Code Status: Full Code Family Communication: None at bedside Disposition Plan: Discharge pending continued workup of infection/blood cultures   Consultants:   Nephrology  Procedures:   HD  MWF  Antimicrobials:  Vancomycin  Cefepime    Subjective: Continued cough  Objective: Vitals:   03/19/18 1134 03/19/18 1246 03/19/18 1249 03/19/18 1628  BP: (!) 113/54  117/63 (!) 79/40  Pulse: (!) 57 (!) 52 (!) 52 (!) 48  Resp: 18 (!) 24 (!) 22 (!) 28  Temp: 98.8 F (37.1 C)  99 F (37.2 C) 98.9 F (37.2 C)  TempSrc: Oral  Oral Oral  SpO2: 96% 90% 97% 98%  Weight: 91.4 kg     Height:        Intake/Output Summary (Last 24 hours) at 03/19/2018 1637 Last data filed at 03/19/2018 1134 Gross per 24 hour  Intake -  Output 0 ml  Net 0 ml   Filed Weights   03/17/18 1152 03/19/18 0725 03/19/18 1134  Weight: 89.2 kg 91.5 kg 91.4 kg    Examination:  General exam: Appears calm and comfortable Respiratory system: Rhonchi bilaterally, normal effort. Cardiovascular system: S1 & S2 heard, RRR. No murmurs, rubs, gallops or clicks. Gastrointestinal system: Abdomen is nondistended, soft and nontender. No organomegaly or masses felt. Normal bowel sounds heard. Central nervous system: Alert and oriented. No focal neurological deficits. Extremities: No edema. No calf tenderness Skin: No cyanosis. No rashes Psychiatry: Judgement and insight appear normal. Mood & affect appropriate.     Data Reviewed: I have personally reviewed following labs and imaging studies  CBC: Recent Labs  Lab 03/16/18 0801 03/17/18 0422 03/18/18 0521 03/19/18 0335  WBC 3.4* 2.1* 3.5* 3.3*  NEUTROABS 3.1 1.6* 2.9 2.8  HGB 10.6* 9.8* 9.3* 10.0*  HCT 33.5* 31.0* 29.7* 32.3*  MCV 106.0* 104.7* 104.6* 107.7*  PLT 50* 46* 56* 59*   Basic Metabolic Panel: Recent Labs  Lab 03/16/18 0801 03/17/18 0422 03/19/18 0720  NA 144 144 137  K 3.7 3.9 4.5  CL 99 103 98  CO2 26 25 23   GLUCOSE 144* 108* 89  BUN 78* 92* 70*  CREATININE 7.58* 8.93* 6.95*  CALCIUM 8.7* 8.7* 8.4*  PHOS  --   --  4.4   GFR: Estimated Creatinine Clearance: 12.5 mL/min (A) (by C-G formula based on SCr of 6.95 mg/dL  (H)). Liver Function Tests: Recent Labs  Lab 03/16/18 0801 03/17/18 0422 03/19/18 0720  AST 19 21  --   ALT 25 23  --   ALKPHOS 106 96  --   BILITOT 1.1 0.9  --   PROT 6.1* 5.7*  --   ALBUMIN 3.7 3.1* 2.8*   No results for input(s): LIPASE, AMYLASE in the last 168 hours. No results for input(s): AMMONIA in the last 168 hours. Coagulation Profile: No results for input(s): INR, PROTIME in the last 168 hours. Cardiac Enzymes: No results for input(s): CKTOTAL, CKMB, CKMBINDEX, TROPONINI in the last 168 hours. BNP (last 3 results) No results for input(s): PROBNP in the last 8760 hours. HbA1C: No results for input(s): HGBA1C in the last 72 hours. CBG: Recent Labs  Lab 03/18/18 1643 03/18/18 2157 03/19/18 0659 03/19/18 0829 03/19/18 1109  GLUCAP 117* 122* 75 78 103*   Lipid Profile: No results for input(s): CHOL, HDL, LDLCALC, TRIG, CHOLHDL, LDLDIRECT in the last 72 hours. Thyroid Function Tests: No results for input(s): TSH, T4TOTAL, FREET4, T3FREE, THYROIDAB in the last 72 hours. Anemia Panel: No results for input(s): VITAMINB12, FOLATE, FERRITIN, TIBC, IRON, RETICCTPCT in the last 72 hours. Sepsis Labs: Recent Labs  Lab 03/16/18 1610  LATICACIDVEN 1.33    Recent Results (from the past 240 hour(s))  Blood Culture (routine x 2)     Status: None (Preliminary result)   Collection Time: 03/16/18  8:01 AM  Result Value Ref Range Status   Specimen Description RIGHT ANTECUBITAL IV  Final   Special Requests   Final    BOTTLES DRAWN AEROBIC AND ANAEROBIC Blood Culture results may not be optimal due to an inadequate volume of blood received in culture bottles   Culture   Final    NO GROWTH 3 DAYS Performed at [facility], [place] [address], [place], [place] [address]    Report Status PENDING  Incomplete  C difficile quick scan w PCR reflex     Status: None   Collection Time: 03/16/18  8:06 AM  Result Value Ref Range Status   C Diff antigen NEGATIVE NEGATIVE Final    C Diff toxin NEGATIVE NEGATIVE Final   C Diff interpretation No C. difficile detected.  Final    Comment: Performed at [facility], [place] [address], [place], [place] [address]  MRSA PCR Screening     Status: None   Collection Time: 03/16/18  5:45 PM  Result Value Ref Range Status   MRSA by PCR NEGATIVE NEGATIVE Final    Comment:        The GeneXpert MRSA Assay (FDA approved for NASAL specimens only), is one component of a comprehensive MRSA colonization surveillance program. It is not intended to diagnose MRSA infection nor to guide or monitor treatment for MRSA infections. Performed at [facility], [place] [address] [place],  [address]   Blood Culture (routine x 2)     Status: None (Preliminary result)   Collection Time: 03/16/18  6:03 PM  Result Value Ref Range Status   Specimen Description BLOOD RIGHT HAND  Final   Special Requests  Final    BOTTLES DRAWN AEROBIC AND ANAEROBIC Blood Culture adequate volume   Culture   Final    NO GROWTH 3 DAYS Performed at [facility], [place] [address], [place], [place] [address]    Report Status PENDING  Incomplete         Radiology Studies: No results found.      Scheduled Meds: . aspirin EC  81 mg Oral QHS  . Chlorhexidine Gluconate Cloth  6 each Topical Q0600  . insulin aspart  0-9 Units Subcutaneous TID WC  . midodrine  10 mg Oral TID WC  . multivitamin  1 tablet Oral Daily  . pantoprazole  40 mg Oral BID AC  . sevelamer carbonate  4,800 mg Oral TID WC   Continuous Infusions: . ceFEPime (MAXIPIME) IV 2 g (03/17/18 2053)  . vancomycin Stopped (03/19/18 1121)     LOS: 3 days     Cordelia Poche, MD [facility] 03/19/2018, 4:37 PM  If 7PM-7AM, please contact night-coverage [email or web address]

## 2018-03-19 NOTE — Progress Notes (Signed)
Pharmacy Antibiotic Note  [name] is a 67 y.o. male admitted on 03/16/2018 with pneumonia.  Pharmacy has been consulted for vancomycin dosing. Pt with history of ESRD on HD.  Plan: Vancomycin 1gm post-HD Would suggest d/c of vancomycin as MRSA PCR is negative Renally adjust cefepime per MD F/u renal plans, C&S, clinical status and pre-HD vanc level when appropriate  Height: 6\' 4"  (193 cm) Weight: 201 lb 11.5 oz (91.5 kg) IBW/kg (Calculated) : 86.8  Temp (24hrs), Avg:99.1 F (37.3 C), Min:98.3 F (36.8 C), Max:100.4 F (38 C)  Recent Labs  Lab 03/16/18 0801 03/16/18 0814 03/17/18 0422 03/18/18 0521 03/19/18 0335 03/19/18 0720  WBC 3.4*  --  2.1* 3.5* 3.3*  --   CREATININE 7.58*  --  8.93*  --   --  6.95*  LATICACIDVEN  --  1.33  --   --   --   --     Estimated Creatinine Clearance: 12.5 mL/min (A) (by C-G formula based on SCr of 6.95 mg/dL (H)).    Allergies  Allergen Reactions  . Codeine Other (See Comments)     Aneth  . Tape Other (See Comments)    Plastic tape tears skin off, please use paper tape instead.    Antimicrobials this admission: Cefepime 12/13>> Vanc 12/13>> CTX x 1 12/13  Dose adjustments this admission: N/A  Microbiology results: 12/13 Blood: NGTD 12/13: MRSA PCR Negative  [name] A. Levada Dy, PharmD, [place] Pager: [phone] Please utilize Amion for appropriate phone number to reach the unit pharmacist ([name])    03/19/2018 10:14 AM

## 2018-03-19 NOTE — Care Management Important Message (Signed)
Important Message  Patient Details  Name: ADRIANN BALLWEG MRN: [id number] Date of Birth: 1950-04-26   Medicare Important Message Given:  Yes    Pollie Friar, RN 03/19/2018, 1:08 PM

## 2018-03-20 ENCOUNTER — Inpatient Hospital Stay (HOSPITAL_COMMUNITY): Payer: Medicare Other

## 2018-03-20 LAB — CBC WITH DIFFERENTIAL/PLATELET
Abs Immature Granulocytes: 0.02 10*3/uL (ref 0.00–0.07)
BASOS PCT: 0 %
Basophils Absolute: 0 10*3/uL (ref 0.0–0.1)
Eosinophils Absolute: 0 10*3/uL (ref 0.0–0.5)
Eosinophils Relative: 1 %
HCT: 28.3 % — ABNORMAL LOW (ref 39.0–52.0)
Hemoglobin: 9 g/dL — ABNORMAL LOW (ref 13.0–17.0)
Immature Granulocytes: 1 %
Lymphocytes Relative: 9 %
Lymphs Abs: 0.2 10*3/uL — ABNORMAL LOW (ref 0.7–4.0)
MCH: 33.3 pg (ref 26.0–34.0)
MCHC: 31.8 g/dL (ref 30.0–36.0)
MCV: 104.8 fL — ABNORMAL HIGH (ref 80.0–100.0)
Monocytes Absolute: 0.3 10*3/uL (ref 0.1–1.0)
Monocytes Relative: 14 %
Neutro Abs: 1.5 10*3/uL — ABNORMAL LOW (ref 1.7–7.7)
Neutrophils Relative %: 75 %
Platelets: 62 10*3/uL — ABNORMAL LOW (ref 150–400)
RBC: 2.7 MIL/uL — ABNORMAL LOW (ref 4.22–5.81)
RDW: 13.2 % (ref 11.5–15.5)
WBC: 2 10*3/uL — ABNORMAL LOW (ref 4.0–10.5)
nRBC: 0 % (ref 0.0–0.2)

## 2018-03-20 LAB — GLUCOSE, CAPILLARY
GLUCOSE-CAPILLARY: 93 mg/dL (ref 70–99)
Glucose-Capillary: 90 mg/dL (ref 70–99)
Glucose-Capillary: 107 mg/dL — ABNORMAL HIGH (ref 70–99)
Glucose-Capillary: 115 mg/dL — ABNORMAL HIGH (ref 70–99)

## 2018-03-20 MED ORDER — CHLORHEXIDINE GLUCONATE CLOTH 2 % EX PADS
6.0000 | MEDICATED_PAD | Freq: Every day | CUTANEOUS | Status: DC
  Administered 2018-03-21 – 2018-03-23 (×3): 6 via TOPICAL

## 2018-03-20 NOTE — Progress Notes (Signed)
[facility] Progress Note  Subjective: seen in room, chronically ill appearing  Vitals:   03/20/18 0050 03/20/18 0337 03/20/18 0808 03/20/18 1100  BP: (!) 102/57 (!) 95/52  (!) 115/53  Pulse: (!) 56 79  (!) 56  Resp: (!) 23 (!) 21  10  Temp:   99 F (37.2 C) 98.8 F (37.1 C)  TempSrc:   Oral Axillary  SpO2: 99% 91%  98%  Weight:      Height:        Inpatient medications: . aspirin EC  81 mg Oral QHS  . Chlorhexidine Gluconate Cloth  6 each Topical Q0600  . guaiFENesin  1,200 mg Oral BID  . insulin aspart  0-9 Units Subcutaneous TID WC  . midodrine  10 mg Oral TID WC  . multivitamin  1 tablet Oral Daily  . pantoprazole  40 mg Oral BID AC  . sevelamer carbonate  4,800 mg Oral TID WC   . ceFEPime (MAXIPIME) IV 2 g (03/19/18 2015)   acetaminophen **OR** acetaminophen, ondansetron **OR** ondansetron (ZOFRAN) IV, sevelamer carbonate  Iron/TIBC/Ferritin/ %Sat    Component Value Date/Time   IRON 26 (L) 04/12/2016 0657   TIBC 200 (L) 04/12/2016 0657   FERRITIN 1,350 (H) 04/12/2016 0657   IRONPCTSAT 13 (L) 04/12/2016 2536    Exam: Gen alert, frail older adult male not in distress, frail, chronically ill appearing No jvd or bruits Chest clear bilat, no wheezing RRR no MRG +sternotomy scar Abd soft ntnd no mass or ascites +bs Ext R TMA, no LE edema, no wounds or ulcers Neuro awakens easily , gen'd weakness and deconditioning chronic L AVF+bruit   Home meds:  - aspirin 81/ atorvastatin 10/ MVI/ sevelamer carb ac tid  - midodrine 10 tid   Dialysis: MWF [facility]  4h  450/800   90kg  No heparin  LUE AVF  - vit D 2.5 ug tiw  -parsabiv 15 tiw IV  - midodrine 10 tid  - last Hb 11.6     Impression/ Plan:  PNA - per primary.  Fevers resolving.   ESRD on HD: usual HD MWF. OK for dc from renal standpoint.   Volume - stable, no excess. CXR no edema. Min UF on hd.   PAD sp TMA  H/o renal cell cancer  MBD ckd - cont meds  Anemia ckd - not on esa  at clinic, Hb 9's here  Chron hypotension - on midodrine tid  Blindness  Chronic debility - not sure baseline, may be back to baseline   Kelly Splinter MD [place] pager [phone]   03/20/2018, 2:26 PM   Recent Labs  Lab 03/17/18 0422 03/19/18 0720  NA 144 137  K 3.9 4.5  CL 103 98  CO2 25 23  GLUCOSE 108* 89  BUN 92* 70*  CREATININE 8.93* 6.95*  CALCIUM 8.7* 8.4*  PHOS  --  4.4  ALBUMIN 3.1* 2.8*   Recent Labs  Lab 03/16/18 0801 03/17/18 0422  AST 19 21  ALT 25 23  ALKPHOS 106 96  BILITOT 1.1 0.9  PROT 6.1* 5.7*   Recent Labs  Lab 03/19/18 0335 03/20/18 0603  WBC 3.3* 2.0*  NEUTROABS 2.8 1.5*  HGB 10.0* 9.0*  HCT 32.3* 28.3*  MCV 107.7* 104.8*  PLT 59* 62*

## 2018-03-20 NOTE — Progress Notes (Signed)
PROGRESS NOTE    [name]  HBZ:[id number] DOB: [birth year] DOA: 03/16/2018 PCP: [name], MD   Brief Narrative: [name] is a 67 y.o. male with medical history significant of anemia, osteoarthritis, bilateral eye blindness, CAD/CABG, history of cellulitis to lower extremities multiple times, colon polyps, diabetic peripheral neuropathy, type 2 diabetes, ESRD on hemodialysis, history of fall, hyperlipidemia, hypertension, urolithiasis, prostate cancer, peripheral vascular disease, renal cell carcinoma. He presented with falls and diarrhea. Admitted with concern for HCAP and treatment started. C. Difficile negative with blood cultures no growth to date.   Assessment & Plan:   Principal Problem:   HCAP (healthcare-associated pneumonia) Active Problems:   ANEMIA   Hypotension   Bilateral blindness   CAD in native artery   End stage renal failure on dialysis [facility])   Type 2 diabetes mellitus ([name])   Other pancytopenia ([name])   HCAP Presenting problem. Chest x-ray clear. Patient with symptoms of cough with minimal sputum production. Vancomycin discontinued. Wife states that patient ambulates with a walker at baseline with balance difficulties secondary to history of transmetatarsal amputation -Continue Cefepime -Repeat chest x-ray today -Mucinex -Flutter valve -PT/OT evals pending  ESRD Hemodialysis MWF -Per nephrology  Hypotension Secondary to hemodialysis. -Continue midodrine  Anemia of chronic disease In setting of kidney disease -Nephrology  CAD No chest pain. -Continue Lipitor and aspirin  Blindness Chronic per chart review.  Diabetes mellitus Mentioned on history. Not currently on medication. On chart review, no hemoglobin A1C above 6.0% -Continue SSI  Pancytopenia Patient with chronic anemia and thrombocytopenia. Leukopenia in setting of acute illness. Doubt bone marrow failure. Neutrophils of 1.5k -CBC  Pressure ulcer -Wound care  consult   DVT prophylaxis: SCD Code Status:   Code Status: Full Code Family Communication: Wife and caregiver at bedside Disposition Plan: Discharge pending continued workup of infection/blood cultures in addition to PT recommendations   Consultants:   Nephrology  Procedures:   HD MWF  Antimicrobials:  Vancomycin  Cefepime    Subjective: Cough improved per patient  Objective: Vitals:   03/20/18 0050 03/20/18 0337 03/20/18 0808 03/20/18 1100  BP: (!) 102/57 (!) 95/52  (!) 115/53  Pulse: (!) 56 79  (!) 56  Resp: (!) 23 (!) 21  10  Temp:   99 F (37.2 C) 98.8 F (37.1 C)  TempSrc:   Oral Axillary  SpO2: 99% 91%  98%  Weight:      Height:        Intake/Output Summary (Last 24 hours) at 03/20/2018 1556 Last data filed at 03/19/2018 2100 Gross per 24 hour  Intake 240 ml  Output -  Net 240 ml   Filed Weights   03/17/18 1152 03/19/18 0725 03/19/18 1134  Weight: 89.2 kg 91.5 kg 91.4 kg    Examination:  General exam: Appears calm and comfortable Respiratory system: Diminished bilaterally with diffuse rhonchi and normal respiratory effort. Cardiovascular system: S1 & S2 heard, RRR. No murmurs, rubs, gallops or clicks. Gastrointestinal system: Abdomen is nondistended, soft and nontender.  Normal bowel sounds heard. Central nervous system: Alert. Answers questions appropriately Extremities: Mild leg edema. No calf tenderness Skin: No cyanosis. No rashes Psychiatry: Judgement and insight appear normal. Mood & affect appropriate.     Data Reviewed: I have personally reviewed following labs and imaging studies  CBC: Recent Labs  Lab 03/16/18 0801 03/17/18 0422 03/18/18 0521 03/19/18 0335 03/20/18 0603  WBC 3.4* 2.1* 3.5* 3.3* 2.0*  NEUTROABS 3.1 1.6* 2.9 2.8 1.5*  HGB 10.6*  9.8* 9.3* 10.0* 9.0*  HCT 33.5* 31.0* 29.7* 32.3* 28.3*  MCV 106.0* 104.7* 104.6* 107.7* 104.8*  PLT 50* 46* 56* 59* 62*   Basic Metabolic Panel: Recent Labs  Lab  03/16/18 0801 03/17/18 0422 03/19/18 0720  NA 144 144 137  K 3.7 3.9 4.5  CL 99 103 98  CO2 26 25 23   GLUCOSE 144* 108* 89  BUN 78* 92* 70*  CREATININE 7.58* 8.93* 6.95*  CALCIUM 8.7* 8.7* 8.4*  PHOS  --   --  4.4   GFR: Estimated Creatinine Clearance: 12.5 mL/min (A) (by C-G formula based on SCr of 6.95 mg/dL (H)). Liver Function Tests: Recent Labs  Lab 03/16/18 0801 03/17/18 0422 03/19/18 0720  AST 19 21  --   ALT 25 23  --   ALKPHOS 106 96  --   BILITOT 1.1 0.9  --   PROT 6.1* 5.7*  --   ALBUMIN 3.7 3.1* 2.8*   No results for input(s): LIPASE, AMYLASE in the last 168 hours. No results for input(s): AMMONIA in the last 168 hours. Coagulation Profile: No results for input(s): INR, PROTIME in the last 168 hours. Cardiac Enzymes: No results for input(s): CKTOTAL, CKMB, CKMBINDEX, TROPONINI in the last 168 hours. BNP (last 3 results) No results for input(s): PROBNP in the last 8760 hours. HbA1C: No results for input(s): HGBA1C in the last 72 hours. CBG: Recent Labs  Lab 03/19/18 1109 03/19/18 1626 03/19/18 2109 03/20/18 0631 03/20/18 1118  GLUCAP 103* 111* 139* 93 90   Lipid Profile: No results for input(s): CHOL, HDL, LDLCALC, TRIG, CHOLHDL, LDLDIRECT in the last 72 hours. Thyroid Function Tests: No results for input(s): TSH, T4TOTAL, FREET4, T3FREE, THYROIDAB in the last 72 hours. Anemia Panel: No results for input(s): VITAMINB12, FOLATE, FERRITIN, TIBC, IRON, RETICCTPCT in the last 72 hours. Sepsis Labs: Recent Labs  Lab 03/16/18 3299  LATICACIDVEN 1.33    Recent Results (from the past 240 hour(s))  Blood Culture (routine x 2)     Status: None (Preliminary result)   Collection Time: 03/16/18  8:01 AM  Result Value Ref Range Status   Specimen Description RIGHT ANTECUBITAL IV  Final   Special Requests   Final    BOTTLES DRAWN AEROBIC AND ANAEROBIC Blood Culture results may not be optimal due to an inadequate volume of blood received in culture  bottles   Culture   Final    NO GROWTH 4 DAYS Performed at [facility], [place] [address] [place], [place] [address]    Report Status PENDING  Incomplete  C difficile quick scan w PCR reflex     Status: None   Collection Time: 03/16/18  8:06 AM  Result Value Ref Range Status   C Diff antigen NEGATIVE NEGATIVE Final   C Diff toxin NEGATIVE NEGATIVE Final   C Diff interpretation No C. difficile detected.  Final    Comment: Performed at [facility], [place] [address], [place], [place] [address]  MRSA PCR Screening     Status: None   Collection Time: 03/16/18  5:45 PM  Result Value Ref Range Status   MRSA by PCR NEGATIVE NEGATIVE Final    Comment:        The GeneXpert MRSA Assay (FDA approved for NASAL specimens only), is one component of a comprehensive MRSA colonization surveillance program. It is not intended to diagnose MRSA infection nor to guide or monitor treatment for MRSA infections. Performed at [facility], [place]  [address] [place], [place] [address]   Blood Culture (routine x 2)     Status: None (Preliminary result)   Collection Time: 03/16/18  6:03 PM  Result Value Ref Range Status   Specimen Description BLOOD RIGHT HAND  Final   Special Requests   Final    BOTTLES DRAWN AEROBIC AND ANAEROBIC Blood Culture adequate volume   Culture   Final    NO GROWTH 4 DAYS Performed at [facility], [place] [address] [place], [place] [address]    Report Status PENDING  Incomplete         Radiology Studies: No results found.      Scheduled Meds: . aspirin EC  81 mg Oral QHS  . [START ON 03/21/2018] Chlorhexidine Gluconate Cloth  6 each Topical Q0600  . guaiFENesin  1,200 mg Oral BID  . insulin aspart  0-9 Units Subcutaneous TID WC  . midodrine  10 mg Oral TID WC  . multivitamin  1 tablet Oral Daily  . pantoprazole  40 mg Oral BID AC  . sevelamer carbonate  4,800 mg Oral TID WC   Continuous Infusions: . ceFEPime (MAXIPIME) IV 2 g (03/19/18  2015)     LOS: 4 days     Cordelia Poche, MD [facility] 03/20/2018, 3:56 PM  If 7PM-7AM, please contact night-coverage [email or web address]

## 2018-03-20 NOTE — Progress Notes (Signed)
Occupational Therapy Evaluation Patient Details Name: [name] MRN: [id number] DOB: 08-21-1950 Today's Date: 03/20/2018    History of Present Illness 67 y.o. male with medical history significant of anemia, osteoarthritis, bilateral eye blindness, CAD/CABG, history of cellulitis to lower extremities multiple times, colon polyps, diabetic peripheral neuropathy, type 2 diabetes, ESRD on hemodialysis, history of fall, hyperlipidemia, hypertension, urolithiasis, prostate cancer, peripheral vascular disease, renal cell carcinoma. He presented with falls and diarrhea. Admitted with concern for HCAP   Clinical Impression   PTA, pt lived at home with his wife and had 24/7 PCA who assisted with mobility and ADL as needed. Pt used a rollator within the home and assisted with his ADL tasks. Pt's PCA assisted him to dialysis MWF. Pt enjoys listening to news/TV, visiting with friends and going to [place]. Pt currently requires min A with sit - stand and stand pivot transfer using his rollator. Able to complete UB ADL with min A and LB ADL with mod A. Pt is overall deconditioned and at a fall risk and will benefit from [facility] after DC. Pt feels his PCA and family can assist at necessary level to safely DC home. Pt does have a w/c that he can use if needed. Discussed DC recommendations with pt and he verbalized understanding. Will follow acutely to facilitate safe DC home.     Follow Up Recommendations  Home health OT;Supervision/Assistance - 24 hour    Equipment Recommendations  None recommended by OT    Recommendations for Other Services       Precautions / Restrictions Precautions Precautions: Fall Precaution Comments: multiple bruises      Mobility Bed Mobility Overal bed mobility: Needs Assistance Bed Mobility: Supine to Sit;Sit to Supine     Supine to sit: Mod assist Sit to supine: Mod assist      Transfers Overall transfer level: Needs assistance   Transfers: Sit to/from  Stand;Stand Pivot Transfers Sit to Stand: Min assist;From elevated surface Stand pivot transfers: Min assist            Balance Overall balance assessment: Needs assistance;History of Falls(states he "slid", not a "Fall")   Sitting balance-Leahy Scale: Fair       Standing balance-Leahy Scale: Poor                             ADL either performed or assessed with clinical judgement   ADL Overall ADL's : Needs assistance/impaired Eating/Feeding: Set up;Sitting   Grooming: Set up;Sitting   Upper Body Bathing: Set up;Sitting   Lower Body Bathing: Moderate assistance;Sit to/from stand   Upper Body Dressing : Minimal assistance;Sitting   Lower Body Dressing: Sit to/from stand;Maximal assistance   Toilet Transfer: Minimal assistance;Stand-pivot   Toileting- Clothing Manipulation and Hygiene: Maximal assistance Toileting - Clothing Manipulation Details (indicate cue type and reason): incontinent of BM; does not make urine     Functional mobility during ADLs: Minimal assistance(4 wheeled walker; elevated surface) General ADL Comments: Pt ususally sits in a lift chair; Has a rail to help get out of bed     Vision Baseline Vision/History: Legally blind       Perception     Praxis      Pertinent Vitals/Pain Pain Assessment: 0-10 Pain Score: 5  Pain Location: buttocks Pain Descriptors / Indicators: Burning;Discomfort Pain Intervention(s): Limited activity within patient's tolerance     Hand Dominance Left   Extremity/Trunk Assessment Upper Extremity Assessment Upper Extremity Assessment: Generalized weakness  Lower Extremity Assessment Lower Extremity Assessment: Defer to PT evaluation   Cervical / Trunk Assessment Cervical / Trunk Assessment: Kyphotic;Other exceptions(forward head; "drop neck")   Communication     Cognition Arousal/Alertness: Awake/alert Behavior During Therapy: Flat affect Overall Cognitive Status: No family/caregiver  present to determine baseline cognitive functioning                                 General Comments: most likely baseline   General Comments       Exercises     Shoulder Instructions      Home Living Family/patient expects to be discharged to:: Private residence Living Arrangements: Spouse/significant other;Children Available Help at Discharge: Available 24 hours/day;Personal care attendant(PCA 7 days/wk 24 hrs/day) Type of Home: House Home Access: Ramped entrance     Home Layout: One level     Bathroom Shower/Tub: Teacher, early years/pre: Handicapped height Bathroom Accessibility: No   Home Equipment: Grab bars - tub/shower;Grab bars - toilet;Shower seat;Walker - 4 wheels;Walker - 2 wheels;Wheelchair - manual(lift chair; bed has handicap bar)          Prior Functioning/Environment Level of Independence: Needs assistance  Gait / Transfers Assistance Needed: uses rollator for house; wc in community ADL's / Homemaking Assistance Needed: spouse/PCA assists with self care as needed; pt baths self and helps to dress self Communication / Swallowing Assistance Needed: HOH          OT Problem List: Decreased strength;Decreased activity tolerance;Impaired balance (sitting and/or standing);Impaired vision/perception      OT Treatment/Interventions: Self-care/ADL training;Therapeutic exercise;DME and/or AE instruction;Therapeutic activities;Patient/family education;Balance training    OT Goals(Current goals can be found in the care plan section) Acute Rehab OT Goals Patient Stated Goal: to go home OT Goal Formulation: With patient Time For Goal Achievement: 04/03/18 Potential to Achieve Goals: Good  OT Frequency: Min 2X/week   Barriers to D/C:            Co-evaluation              AM-PAC OT "6 Clicks" Daily Activity     Outcome Measure Help from another person eating meals?: A Little Help from another person taking care of personal  grooming?: A Little Help from another person toileting, which includes using toliet, bedpan, or urinal?: A Lot Help from another person bathing (including washing, rinsing, drying)?: A Lot Help from another person to put on and taking off regular upper body clothing?: A Little Help from another person to put on and taking off regular lower body clothing?: A Lot 6 Click Score: 15   End of Session Equipment Utilized During Treatment: Gait belt;Oxygen(2L; 4 wheeled RW) Nurse Communication: Mobility status  Activity Tolerance: Patient tolerated treatment well Patient left: in bed;with call bell/phone within reach;with bed alarm set  OT Visit Diagnosis: Unsteadiness on feet (R26.81);Muscle weakness (generalized) (M62.81);History of falling (Z91.81)                Time: 1725-1800 OT Time Calculation (min): 35 min Charges:  OT General Charges $OT Visit: 1 Visit OT Evaluation $OT Eval Moderate Complexity: 1 Mod OT Treatments $Self Care/Home Management : 8-22 mins  Maurie Boettcher, OT/L   Acute OT Clinical Specialist [place] Pager [phone] Office [phone]   [facility] 03/20/2018, 6:35 PM

## 2018-03-20 NOTE — Care Management Important Message (Signed)
Important Message  Patient Details  Name: [name] MRN: [id number] Date of Birth: Nov 22, 1950   Medicare Important Message Given:  Yes    Loghan Kurtzman Montine Circle 03/20/2018, 3:47 PM

## 2018-03-20 NOTE — Progress Notes (Signed)
OT Cancellation    03/20/18 1700  OT Visit Information  Last OT Received On 03/20/18  Reason Eval/Treat Not Completed Patient at procedure or test/ unavailable (x-ray)  Maurie Boettcher, OT/L   Acute OT Clinical Specialist [place] Pager [phone] Office [phone]

## 2018-03-21 DIAGNOSIS — L8990 Pressure ulcer of unspecified site, unspecified stage: Secondary | ICD-10-CM

## 2018-03-21 DIAGNOSIS — J9601 Acute respiratory failure with hypoxia: Secondary | ICD-10-CM

## 2018-03-21 LAB — CBC WITH DIFFERENTIAL/PLATELET
Abs Immature Granulocytes: 0.03 10*3/uL (ref 0.00–0.07)
Basophils Absolute: 0 10*3/uL (ref 0.0–0.1)
Basophils Relative: 0 %
Eosinophils Absolute: 0.1 10*3/uL (ref 0.0–0.5)
Eosinophils Relative: 2 %
HCT: 28.1 % — ABNORMAL LOW (ref 39.0–52.0)
Hemoglobin: 8.9 g/dL — ABNORMAL LOW (ref 13.0–17.0)
Immature Granulocytes: 1 %
Lymphocytes Relative: 11 %
Lymphs Abs: 0.3 10*3/uL — ABNORMAL LOW (ref 0.7–4.0)
MCH: 33 pg (ref 26.0–34.0)
MCHC: 31.7 g/dL (ref 30.0–36.0)
MCV: 104.1 fL — ABNORMAL HIGH (ref 80.0–100.0)
Monocytes Absolute: 0.4 10*3/uL (ref 0.1–1.0)
Monocytes Relative: 16 %
NRBC: 0 % (ref 0.0–0.2)
Neutro Abs: 1.7 10*3/uL (ref 1.7–7.7)
Neutrophils Relative %: 70 %
Platelets: 65 10*3/uL — ABNORMAL LOW (ref 150–400)
RBC: 2.7 MIL/uL — ABNORMAL LOW (ref 4.22–5.81)
RDW: 13.1 % (ref 11.5–15.5)
WBC: 2.5 10*3/uL — ABNORMAL LOW (ref 4.0–10.5)

## 2018-03-21 LAB — GLUCOSE, CAPILLARY
GLUCOSE-CAPILLARY: 85 mg/dL (ref 70–99)
GLUCOSE-CAPILLARY: 172 mg/dL — AB (ref 70–99)
Glucose-Capillary: 113 mg/dL — ABNORMAL HIGH (ref 70–99)
Glucose-Capillary: 135 mg/dL — ABNORMAL HIGH (ref 70–99)

## 2018-03-21 LAB — RENAL FUNCTION PANEL
Albumin: 2.6 g/dL — ABNORMAL LOW (ref 3.5–5.0)
Anion gap: 13 (ref 5–15)
BUN: 53 mg/dL — ABNORMAL HIGH (ref 8–23)
CO2: 25 mmol/L (ref 22–32)
Calcium: 8 mg/dL — ABNORMAL LOW (ref 8.9–10.3)
Chloride: 96 mmol/L — ABNORMAL LOW (ref 98–111)
Creatinine, Ser: 6.11 mg/dL — ABNORMAL HIGH (ref 0.61–1.24)
GFR calc Af Amer: 10 mL/min — ABNORMAL LOW (ref >60)
GFR calc non Af Amer: 9 mL/min — ABNORMAL LOW (ref >60)
Glucose, Bld: 92 mg/dL (ref 70–99)
Phosphorus: 2.9 mg/dL (ref 2.5–4.6)
Potassium: 4 mmol/L (ref 3.5–5.1)
Sodium: 134 mmol/L — ABNORMAL LOW (ref 135–145)

## 2018-03-21 LAB — CULTURE, BLOOD (ROUTINE X 2)
Culture: NO GROWTH
Culture: NO GROWTH
Special Requests: ADEQUATE

## 2018-03-21 LAB — MAGNESIUM: Magnesium: 2.1 mg/dL (ref 1.7–2.4)

## 2018-03-21 MED ORDER — ACETAMINOPHEN 325 MG PO TABS
ORAL_TABLET | ORAL | Status: AC
  Filled 2018-03-21: qty 2

## 2018-03-21 MED ORDER — DARBEPOETIN ALFA 60 MCG/0.3ML IJ SOSY
60.0000 ug | PREFILLED_SYRINGE | INTRAMUSCULAR | Status: DC
  Administered 2018-03-21: 60 ug via SUBCUTANEOUS
  Filled 2018-03-21: qty 0.3

## 2018-03-21 MED ORDER — MIDODRINE HCL 5 MG PO TABS
ORAL_TABLET | ORAL | Status: AC
  Filled 2018-03-21: qty 2

## 2018-03-21 NOTE — Progress Notes (Signed)
[facility] Progress Note  Subjective: seen on HD  Vitals:   03/21/18 0800 03/21/18 0830 03/21/18 0900 03/21/18 0930  BP: (!) 106/47 (!) 89/41 (!) 91/48 (!) 94/47  Pulse: (!) 54 (!) 56 (!) 57 (!) 57  Resp:      Temp:      TempSrc:      SpO2:      Weight:      Height:        Inpatient medications: . acetaminophen      . midodrine      . aspirin EC  81 mg Oral QHS  . Chlorhexidine Gluconate Cloth  6 each Topical Q0600  . guaiFENesin  1,200 mg Oral BID  . insulin aspart  0-9 Units Subcutaneous TID WC  . midodrine  10 mg Oral TID WC  . multivitamin  1 tablet Oral Daily  . pantoprazole  40 mg Oral BID AC  . sevelamer carbonate  4,800 mg Oral TID WC   . ceFEPime (MAXIPIME) IV 2 g (03/19/18 2015)   acetaminophen **OR** acetaminophen, ondansetron **OR** ondansetron (ZOFRAN) IV, sevelamer carbonate  Iron/TIBC/Ferritin/ %Sat    Component Value Date/Time   IRON 26 (L) 04/12/2016 0657   TIBC 200 (L) 04/12/2016 0657   FERRITIN 1,350 (H) 04/12/2016 0657   IRONPCTSAT 13 (L) 04/12/2016 5027    Exam: Gen alert, frail older adult male not in distress, frail, chronically ill, somnolent No jvd or bruits Chest coarse rhonchi / congestion bilat RRR no MRG +sternotomy scar Abd soft ntnd no mass or ascites +bs Ext R TMA, no LE edema, no wounds or ulcers Neuro awakens easily , gen'd weakness and deconditioning chronic L AVF+bruit   Home meds:  - aspirin 81/ atorvastatin 10/ MVI/ sevelamer carb ac tid  - midodrine 10 tid   Dialysis: MWF [facility]  4h  450/800   90kg  No heparin  LUE AVF  - vit D 2.5 ug tiw  -parsabiv 15 tiw IV  - midodrine 10 tid  - last Hb 11.6     Impression/ Plan:  PNA - per primary.  Fevers resolving.   ESRD on HD: usual HD MWF. HD today.   Volume - no edema on CXR or exam. Try to lower edw a bit.   PAD sp TMA  H/o renal cell cancer  MBD ckd - cont meds  Anemia ckd - not on esa at clinic, Hb 9's here. Give 1 dose esa 60 ug  today  Chron hypotension - on midodrine tid  Blindness  Chronic debility - I believe he was at home prior to admission   Kelly Splinter MD [place] pager [phone]   03/21/2018, 10:26 AM   Recent Labs  Lab 03/19/18 0720 03/21/18 0625  NA 137 134*  K 4.5 4.0  CL 98 96*  CO2 23 25  GLUCOSE 89 92  BUN 70* 53*  CREATININE 6.95* 6.11*  CALCIUM 8.4* 8.0*  PHOS 4.4 2.9  ALBUMIN 2.8* 2.6*   Recent Labs  Lab 03/16/18 0801 03/17/18 0422  AST 19 21  ALT 25 23  ALKPHOS 106 96  BILITOT 1.1 0.9  PROT 6.1* 5.7*   Recent Labs  Lab 03/20/18 0603 03/21/18 0453  WBC 2.0* 2.5*  NEUTROABS 1.5* 1.7  HGB 9.0* 8.9*  HCT 28.3* 28.1*  MCV 104.8* 104.1*  PLT 62* 65*

## 2018-03-21 NOTE — Evaluation (Addendum)
Physical Therapy Evaluation  (late note for 12/17) Patient Details Name: [name] MRN: [id number] DOB: January 12, 1951 Today's Date: 03/21/2018 2  History of Present Illness  67 y.o. male with medical history significant of anemia, osteoarthritis, bilateral eye blindness, CAD/CABG, history of cellulitis to lower extremities multiple times, colon polyps, diabetic peripheral neuropathy, type 2 diabetes, ESRD on hemodialysis, history of fall, hyperlipidemia, hypertension, urolithiasis, prostate cancer, peripheral vascular disease, renal cell carcinoma. He presented with falls and diarrhea. Admitted with concern for HCAP  Clinical Impression  Pt admitted with/for fall, diarrhea and concern for PNA.  Pt needing moderate assist for basic mobility, but was lethargic and did a little better once up.  He did fatigue quickly..  Pt currently limited functionally due to the problems listed below.  (see problems list.)  Pt will benefit from PT to maximize function and safety to be able to get home safely with available assist.     Follow Up Recommendations Home health PT;Supervision/Assistance - 24 hour;Supervision for mobility/OOB    Equipment Recommendations  None recommended by PT    Recommendations for Other Services       Precautions / Restrictions Precautions Precautions: Fall Precaution Comments: multiple bruises      Mobility  Bed Mobility Overal bed mobility: Needs Assistance Bed Mobility: Supine to Sit;Sit to Supine     Supine to sit: Mod assist Sit to supine: Mod assist      Transfers Overall transfer level: Needs assistance   Transfers: Sit to/from Stand;Stand Pivot Transfers Sit to Stand: From elevated surface;Mod assist(from standard lower surface)            Ambulation/Gait Ambulation/Gait assistance: Min assist;Mod assist Gait Distance (Feet): 50 Feet Assistive device: 4-wheeled walker Gait Pattern/deviations: Step-through pattern Gait velocity: slower Gait  velocity interpretation: <1.8 ft/sec, indicate of risk for recurrent falls General Gait Details: weak mildly unsteady gait with flexed posture, head down unless cued repetitively to stand more upright.  Stairs            Wheelchair Mobility    Modified Rankin (Stroke Patients Only)       Balance Overall balance assessment: Needs assistance;History of Falls(states he "slid", not a "Fall")   Sitting balance-Leahy Scale: Fair Sitting balance - Comments: Initially(first 5-8 min at EOB pt with list to the L when not holding to rail or using at least 1 UE, but became more steady with time up at Eob     Standing balance-Leahy Scale: Poor                               Pertinent Vitals/Pain Pain Assessment: Faces Faces Pain Scale: Hurts little more Pain Location: buttocks Pain Descriptors / Indicators: Burning;Discomfort Pain Intervention(s): Monitored during session    Home Living Family/patient expects to be discharged to:: Private residence Living Arrangements: Spouse/significant other;Children Available Help at Discharge: Available 24 hours/day;Personal care attendant(PCA 7 days/wk 24 hrs/day) Type of Home: House Home Access: Ramped entrance     Home Layout: One level Home Equipment: Grab bars - tub/shower;Grab bars - toilet;Shower seat;Walker - 4 wheels;Walker - 2 wheels;Wheelchair - manual(lift chair; bed has handicap bar)      Prior Function Level of Independence: Needs assistance   Gait / Transfers Assistance Needed: uses rollator for house; wc in community  ADL's / Homemaking Assistance Needed: spouse/PCA assists with self care as needed; pt baths self and helps to dress self  Hand Dominance   Dominant Hand: Left    Extremity/Trunk Assessment   Upper Extremity Assessment Upper Extremity Assessment: Defer to OT evaluation    Lower Extremity Assessment Lower Extremity Assessment: Generalized weakness    Cervical / Trunk  Assessment Cervical / Trunk Assessment: Kyphotic;Other exceptions(forward head; "drop neck")  Communication      Cognition Arousal/Alertness: Awake/alert Behavior During Therapy: Flat affect Overall Cognitive Status: No family/caregiver present to determine baseline cognitive functioning                                 General Comments: most likely baseline      General Comments      Exercises     Assessment/Plan    PT Assessment Patient needs continued PT services  PT Problem List Decreased strength;Decreased activity tolerance;Decreased balance;Decreased mobility;Cardiopulmonary status limiting activity       PT Treatment Interventions Gait training;DME instruction;Stair training;Functional mobility training;Therapeutic activities;Balance training;Patient/family education    PT Goals (Current goals can be found in the Care Plan section)  Acute Rehab PT Goals Patient Stated Goal: to go home PT Goal Formulation: With patient Time For Goal Achievement: 03/27/18 Potential to Achieve Goals: Good    Frequency Min 3X/week   Barriers to discharge        Co-evaluation               AM-PAC PT "6 Clicks" Mobility  Outcome Measure Help needed turning from your back to your side while in a flat bed without using bedrails?: A Little Help needed moving from lying on your back to sitting on the side of a flat bed without using bedrails?: A Lot Help needed moving to and from a bed to a chair (including a wheelchair)?: A Little Help needed standing up from a chair using your arms (e.g., wheelchair or bedside chair)?: A Little Help needed to walk in hospital room?: A Little Help needed climbing 3-5 steps with a railing? : A Little 6 Click Score: 17    End of Session   Activity Tolerance: Patient tolerated treatment well;Patient limited by fatigue Patient left: in chair;with call bell/phone within reach;with chair alarm set Nurse Communication: Mobility  status PT Visit Diagnosis: Unsteadiness on feet (R26.81);Muscle weakness (generalized) (M62.81);Difficulty in walking, not elsewhere classified (R26.2)    Time: 1350-1420 PT Time Calculation (min) (ACUTE ONLY): 30 min   Charges:   PT Evaluation $PT Eval Moderate Complexity: 1 Mod PT Treatments $Gait Training: 8-22 mins        03/21/2018  Donnella Sham, PT Acute Rehabilitation Services [phone]  (pager) [phone]  (office)  Tessie Fass Shakeel Disney 03/21/2018, 1:18 AM

## 2018-03-21 NOTE — Progress Notes (Signed)
[facility]                                                                              Patient Demographics  [name], is a 67 y.o. male, DOB - 11/17/1950, BTD:[id number]  Admit date - 03/16/2018   Admitting Physician Reubin Milan, MD  Outpatient Primary MD for the patient is [name], MD  Outpatient specialists:   LOS - 5  days   Medical records reviewed and are as summarized below:    Chief Complaint  Patient presents with  . Diarrhea  . Weakness       Brief summary   [name] is a 67 y.o. malewith medical history significant ofanemia, osteoarthritis, bilateral eye blindness, CAD/CABG, history of cellulitis to lower extremities multiple times, colon polyps, diabetic peripheral neuropathy, type 2 diabetes, ESRD on hemodialysis, history of fall, hyperlipidemia, hypertension, urolithiasis, prostate cancer, peripheral vascular disease, renal cell carcinoma. He presented with falls and diarrhea. Admitted with concern for HCAP and treatment started. C. Difficile negative with blood cultures no growth to date.   Assessment & Plan    Healthcare associated pneumonia -Presented with H CAP, chest x-ray however was clear. -Repeat chest x-ray on 12/17 showed infiltrates in the left base, developing pneumonia or aspiration. -Continue cefepime, Mucinex, flutter valve, -SLP evaluation   Generalized debility -Per family, patient ambulates with a walker at baseline, balance difficulty secondary to transmetatarsal amputation -Follow PT OT   Sinus pauses with bigeminy -3.09sec pause on telemetry.  Patient has been followed closely by cardiology, last seen on 02/15/2018 by Dr. Percival Spanish -Patient was noted to have bradycardia, per Dr. Rosezella [place] notes, patient had Holter monitor and was noted to have a heart rate of 29 especially with episodic 2 is to 1 block, sinus bradycardia -However because of severe comorbid conditions including ESRD,  recommended no change in therapy, high risk for pacing.  -will check 2D echo, magnesium, not on AV nodal blocking drugs, keep potassium ~4, magnesium > 2  ESRD -On hemodialysis MWF, nephrology consulted  Hypotension Secondary to HD, continue midodrine  Anemia of chronic disease/renal disease Hemoglobin 8.9, follow closely  History of CAD Currently no chest pain, continue Lipitor, aspirin  Blindness Chronic per chart review.  Diabetes mellitus -Type II, continue sliding scale insulin CBGs controlled  Pancytopenia Has chronic anemia with leukopenia and thrombocytopenia -Follow CBC    Pressure injury of skin Coccygeal and perineal skin ulceration stage III Continue wound care  Code Status: Full DVT Prophylaxis: SCDs Family Communication: Discussed in detail with the patient, all imaging results, lab results explained to the patient    Disposition Plan: Hopefully next 24 to 48 hours  Time Spent in minutes   35  Procedures:  HD  Consultants:   Nephrology  Antimicrobials:      Medications  Scheduled Meds: . aspirin EC  81 mg Oral QHS  . Chlorhexidine Gluconate Cloth  6 each Topical Q0600  . darbepoetin (ARANESP) injection - NON-DIALYSIS  60 mcg Subcutaneous Q Wed-1800  . guaiFENesin  1,200 mg Oral BID  . insulin aspart  0-9 Units Subcutaneous TID WC  . midodrine  10 mg Oral TID WC  . multivitamin  1 tablet Oral Daily  . pantoprazole  40 mg Oral BID AC  . sevelamer carbonate  4,800 mg Oral TID WC   Continuous Infusions: . ceFEPime (MAXIPIME) IV Stopped (03/19/18 2046)   PRN Meds:.acetaminophen **OR** acetaminophen, ondansetron **OR** ondansetron (ZOFRAN) IV, sevelamer carbonate   Antibiotics   Anti-infectives (From admission, onward)   Start     Dose/Rate Route Frequency Ordered Stop   03/19/18 2000  ceFEPIme (MAXIPIME) 2 g in sodium chloride 0.9 % 100 mL IVPB  Status:  Discontinued     2 g 200 mL/hr over 30 Minutes Intravenous Every M-W-F (2000)  03/16/18 1346 03/17/18 1059   03/19/18 1200  vancomycin (VANCOCIN) IVPB 1000 mg/200 mL premix  Status:  Discontinued     1,000 mg 200 mL/hr over 60 Minutes Intravenous Every M-W-F (Hemodialysis) 03/16/18 1346 03/17/18 1059   03/17/18 2000  ceFEPIme (MAXIPIME) 2 g in sodium chloride 0.9 % 100 mL IVPB     2 g 200 mL/hr over 30 Minutes Intravenous Every M-W-F (2000) 03/17/18 1059     03/17/18 1200  vancomycin (VANCOCIN) IVPB 1000 mg/200 mL premix  Status:  Discontinued     1,000 mg 200 mL/hr over 60 Minutes Intravenous Every M-W-F (Hemodialysis) 03/17/18 1059 03/19/18 1642   03/17/18 1100  cefTRIAXone (ROCEPHIN) 1 g in sodium chloride 0.9 % 100 mL IVPB  Status:  Discontinued     1 g 200 mL/hr over 30 Minutes Intravenous Every 24 hours 03/16/18 1049 03/16/18 1204   03/17/18 0900  azithromycin (ZITHROMAX) 500 mg in sodium chloride 0.9 % 250 mL IVPB  Status:  Discontinued     500 mg 250 mL/hr over 60 Minutes Intravenous Every 24 hours 03/16/18 1049 03/16/18 1049   03/16/18 1345  ceFEPIme (MAXIPIME) 2 g in sodium chloride 0.9 % 100 mL IVPB     2 g 200 mL/hr over 30 Minutes Intravenous  Once 03/16/18 1338 03/16/18 1508   03/16/18 1345  vancomycin (VANCOCIN) 2,000 mg in sodium chloride 0.9 % 500 mL IVPB     2,000 mg 250 mL/hr over 120 Minutes Intravenous  Once 03/16/18 1343 03/16/18 1800   03/16/18 1049  azithromycin (ZITHROMAX) 500 mg in sodium chloride 0.9 % 250 mL IVPB  Status:  Discontinued     500 mg 250 mL/hr over 60 Minutes Intravenous Every 24 hours 03/16/18 1049 03/16/18 1204   03/16/18 1045  cefTRIAXone (ROCEPHIN) 1 g in sodium chloride 0.9 % 100 mL IVPB     1 g 200 mL/hr over 30 Minutes Intravenous  Once 03/16/18 1033 03/16/18 1412   03/16/18 1045  azithromycin (ZITHROMAX) tablet 500 mg  Status:  Discontinued     500 mg Oral  Once 03/16/18 1033 03/16/18 1048        Subjective:   [name] was seen and examined today.  No new complaints, still having coughing, rattling in  the chest.  Does not feel too good, did not sleep well last night.  No chest pain.  Patient denies dizziness,  abdominal pain, N/V/D/C, new weakness, numbess, tingling. No acute events overnight.    Objective:   Vitals:   03/21/18 0930 03/21/18 1109 03/21/18 1323 03/21/18 1440  BP: (!) 94/47 (!) 117/58 (!) 104/52 (!) 112/56  Pulse: (!) 57 (!) 57 (!) 58 (!) 59  Resp:  18 20 16   Temp:  97.8 F (36.6 C) 98.4 F (36.9 C)   TempSrc:  Oral Oral   SpO2:  100% 100%   Weight:  90 kg    Height:        Intake/Output Summary (Last 24 hours) at 03/21/2018 1455 Last data filed at 03/21/2018 1435 Gross per 24 hour  Intake 580 ml  Output 887 ml  Net -307 ml     Wt Readings from Last 3 Encounters:  03/21/18 90 kg  02/20/18 91.6 kg  02/15/18 91.6 kg     Exam  General: Oriented to self,, frail sick appearing, groggy  Eyes:   HEENT:  Atraumatic, normocephalic, normal oropharynx  Cardiovascular: S1 S2 auscultated, RRR  Respiratory: Bilateral coarse rhonchi  Gastrointestinal: Soft, nontender, nondistended, + bowel sounds  Ext: no pedal edema bilaterally, right TMA  Neuro: No new deficits  Musculoskeletal: No digital cyanosis, clubbing  Skin: Coccygeal pressure ulcer Psych: Groggy  Data Reviewed:  I have personally reviewed following labs and imaging studies  Micro Results Recent Results (from the past 240 hour(s))  Blood Culture (routine x 2)     Status: None   Collection Time: 03/16/18  8:01 AM  Result Value Ref Range Status   Specimen Description RIGHT ANTECUBITAL IV  Final   Special Requests   Final    BOTTLES DRAWN AEROBIC AND ANAEROBIC Blood Culture results may not be optimal due to an inadequate volume of blood received in culture bottles   Culture   Final    NO GROWTH 5 DAYS Performed at [facility], [place] [address] [place], [place] [address]    Report Status 03/21/2018 FINAL  Final  C difficile quick scan w PCR reflex     Status: None   Collection  Time: 03/16/18  8:06 AM  Result Value Ref Range Status   C Diff antigen NEGATIVE NEGATIVE Final   C Diff toxin NEGATIVE NEGATIVE Final   C Diff interpretation No C. difficile detected.  Final    Comment: Performed at [facility], [place] [address] [place], [place] [address]  MRSA PCR Screening     Status: None   Collection Time: 03/16/18  5:45 PM  Result Value Ref Range Status   MRSA by PCR NEGATIVE NEGATIVE Final    Comment:        The GeneXpert MRSA Assay (FDA approved for NASAL specimens only), is one component of a comprehensive MRSA colonization surveillance program. It is not intended to diagnose MRSA infection nor to guide or monitor treatment for MRSA infections. Performed at [facility], [place] [address] [place], [place] [address]   Blood Culture (routine x 2)     Status: None   Collection Time: 03/16/18  6:03 PM  Result Value Ref Range Status   Specimen Description BLOOD RIGHT HAND  Final   Special Requests   Final    BOTTLES DRAWN AEROBIC AND ANAEROBIC Blood Culture adequate volume   Culture   Final    NO GROWTH 5 DAYS Performed at [facility], [place] [address], [place], [place] [address]    Report Status 03/21/2018 FINAL  Final    Radiology Reports Dg Chest 2 View  Result Date: 03/20/2018 CLINICAL DATA:  Acute respiratory failure.  Hypoxia. EXAM: CHEST - 2 VIEW COMPARISON:  March 16, 2018 FINDINGS: The heart size borderline. The hila and mediastinum are unremarkable. Mild opacity identified in the left base. No other acute abnormalities or changes. IMPRESSION: Mild infiltrate in the left base suspicious for developing pneumonia or aspiration. Recommend clinical correlation and follow-up to resolution. Electronically Signed  By: Dorise Bullion III M.D   On: 03/20/2018 17:22   Dg Chest Port 1 View  Result Date: 03/16/2018 CLINICAL DATA:  Multiple falls EXAM: PORTABLE CHEST 1 VIEW COMPARISON:  Portable exam 0803 hours compared to 04/18/2017  FINDINGS: Upper normal heart size post CABG. Mediastinal contours and pulmonary vascularity normal. RIGHT basilar atelectasis. Mild chronic elevation of RIGHT diaphragm. Remaining lungs clear. No acute infiltrate, pleural effusion or pneumothorax. Bones demineralized. IMPRESSION: RIGHT basilar atelectasis. Electronically Signed   By: Lavonia Dana M.D.   On: 03/16/2018 08:24    Lab Data:  CBC: Recent Labs  Lab 03/17/18 0422 03/18/18 0521 03/19/18 0335 03/20/18 0603 03/21/18 0453  WBC 2.1* 3.5* 3.3* 2.0* 2.5*  NEUTROABS 1.6* 2.9 2.8 1.5* 1.7  HGB 9.8* 9.3* 10.0* 9.0* 8.9*  HCT 31.0* 29.7* 32.3* 28.3* 28.1*  MCV 104.7* 104.6* 107.7* 104.8* 104.1*  PLT 46* 56* 59* 62* 65*   Basic Metabolic Panel: Recent Labs  Lab 03/16/18 0801 03/17/18 0422 03/19/18 0720 03/21/18 0625  NA 144 144 137 134*  K 3.7 3.9 4.5 4.0  CL 99 103 98 96*  CO2 26 25 23 25   GLUCOSE 144* 108* 89 92  BUN 78* 92* 70* 53*  CREATININE 7.58* 8.93* 6.95* 6.11*  CALCIUM 8.7* 8.7* 8.4* 8.0*  PHOS  --   --  4.4 2.9   GFR: Estimated Creatinine Clearance: 14.2 mL/min (A) (by C-G formula based on SCr of 6.11 mg/dL (H)). Liver Function Tests: Recent Labs  Lab 03/16/18 0801 03/17/18 0422 03/19/18 0720 03/21/18 0625  AST 19 21  --   --   ALT 25 23  --   --   ALKPHOS 106 96  --   --   BILITOT 1.1 0.9  --   --   PROT 6.1* 5.7*  --   --   ALBUMIN 3.7 3.1* 2.8* 2.6*   No results for input(s): LIPASE, AMYLASE in the last 168 hours. No results for input(s): AMMONIA in the last 168 hours. Coagulation Profile: No results for input(s): INR, PROTIME in the last 168 hours. Cardiac Enzymes: No results for input(s): CKTOTAL, CKMB, CKMBINDEX, TROPONINI in the last 168 hours. BNP (last 3 results) No results for input(s): PROBNP in the last 8760 hours. HbA1C: No results for input(s): HGBA1C in the last 72 hours. CBG: Recent Labs  Lab 03/20/18 1118 03/20/18 1639 03/20/18 2113 03/21/18 0557 03/21/18 1422  GLUCAP 90  107* 115* 85 113*   Lipid Profile: No results for input(s): CHOL, HDL, LDLCALC, TRIG, CHOLHDL, LDLDIRECT in the last 72 hours. Thyroid Function Tests: No results for input(s): TSH, T4TOTAL, FREET4, T3FREE, THYROIDAB in the last 72 hours. Anemia Panel: No results for input(s): VITAMINB12, FOLATE, FERRITIN, TIBC, IRON, RETICCTPCT in the last 72 hours. Urine analysis:    Component Value Date/Time   COLORURINE YELLOW 11/01/2015 1917   APPEARANCEUR CLOUDY (A) 11/01/2015 1917   LABSPEC 1.012 11/01/2015 1917   PHURINE 7.0 11/01/2015 1917   GLUCOSEU NEGATIVE 11/01/2015 1917   HGBUR SMALL (A) 11/01/2015 1917   BILIRUBINUR NEGATIVE 11/01/2015 1917   KETONESUR NEGATIVE 11/01/2015 1917   PROTEINUR 100 (A) 11/01/2015 1917   UROBILINOGEN 0.2 07/28/2014 1007   NITRITE NEGATIVE 11/01/2015 1917   LEUKOCYTESUR LARGE (A) 11/01/2015 1917     Estill Cotta M.D. [facility] 03/21/2018, 2:55 PM  Pager: [phone] Between 7am to 7pm - call Pager - [phone]  After 7pm go to [email or web address] - password [email or web address]  Call night coverage person covering after 7pm

## 2018-03-21 NOTE — Care Management Note (Signed)
Case Management Note  Patient Details  Name: MARLENE PFLUGER MRN: [id number] Date of Birth: 31-Dec-1950  Subjective/Objective:     Pt admitted with multiple falls and diarrhea. He is at home with his spouse and aide services. Attempted to talk with patient but he was very sleepy and asked that spouse be called. CM reached out to Mrs Chouinard and she also is at the hospital having a procedure. CM spoke to the patient's son, [name] and he asked that CM reach out to Mrs [facility] tomorrow.                Action/Plan: Recommendations are for [facility] services. CM will follow for [facility] agency and any DME needs.   Expected Discharge Date:                  Expected Discharge Plan:  [facility]  In-House Referral:     Discharge planning Services  CM Consult  Post Acute Care Choice:  Home Health Choice offered to:     DME Arranged:    DME Agency:     HH Arranged:    [place] Agency:     Status of Service:  In process, will continue to follow  If discussed at Long Length of Stay Meetings, dates discussed:    Additional Comments:  Pollie Friar, RN 03/21/2018, 2:09 PM

## 2018-03-21 NOTE — Consult Note (Signed)
[facility] Nurse wound consult note Reason for Consult: Coccygeal and perineal skin ulceration. Bilateral heels are dry and peeling, slow to blanch.  Right foot transmetatarsal amputation. Wound type:Pressure, shear Pressure Injury POA: Yes Measurement: Stage 3 PrI measuring 2.4cm x 0.4cm x 0.2cm with dry red wound bed partially covered by dried serum (scab) DTPI: 2.4cm x 3.6cm with purple/maroon hued discoloration. No break in skin DPTI:  1cm round with purple-maroon hued discoloration. No break in skin Wound bed:As described above Drainage (amount, consistency, odor) None Periwound: Intact, dry.  Patient is incontinent of stool Dressing procedure/placement/frequency: I will provide a mattress replacement, a pressure redistribution chair pad for use while in a chair and bilateral Prevalon boots. Topical care will be twice daily and PRN incontinence saline dressings to the affected areas topped with silicone foam.  Staff is provided guidance for turning and repositioning as well as for decreasing HOB to a 30 degree angle or below.  [place] nursing team will not follow, but will remain available to this patient, the nursing and medical teams.  Please re-consult if needed. Thanks, Maudie Flakes, MSN, RN, [place], Arther Abbott  Pager# [phone]

## 2018-03-21 NOTE — Progress Notes (Signed)
Pt back to room for dialysis. Pt alert and verbally responsive. Will continue to continue to closely monitor. Delia Heady RN   03/21/18 1323  Vitals  Temp 98.4 F (36.9 C)  Temp Source Oral  BP (!) 104/52  MAP (mmHg) 69  BP Location Right Arm  BP Method Automatic  Patient Position (if appropriate) Lying  Pulse Rate (!) 58  Pulse Rate Source Monitor  Resp 20  Oxygen Therapy  SpO2 100 %  O2 Device Nasal Cannula  O2 Flow Rate (L/min) 2 L/min

## 2018-03-21 NOTE — Progress Notes (Signed)
Pt off unit in dialysis. Delia Heady RN

## 2018-03-21 NOTE — Progress Notes (Signed)
Telemetry called RN to inform her of pt having 3.09sec pause and running vent bigeminy on the monitor. MD Rai paged and notified; no new orders received yet. Pt assessed to be in bed asymptomatic and he denies any discomfort. Will continue to closely monitor. Delia Heady RN

## 2018-03-22 ENCOUNTER — Inpatient Hospital Stay (HOSPITAL_COMMUNITY): Payer: Medicare Other

## 2018-03-22 DIAGNOSIS — R9431 Abnormal electrocardiogram [ECG] [EKG]: Secondary | ICD-10-CM

## 2018-03-22 LAB — CBC WITH DIFFERENTIAL/PLATELET
ABS IMMATURE GRANULOCYTES: 0.04 10*3/uL (ref 0.00–0.07)
Basophils Absolute: 0 10*3/uL (ref 0.0–0.1)
Basophils Relative: 0 %
Eosinophils Absolute: 0 10*3/uL (ref 0.0–0.5)
Eosinophils Relative: 2 %
HCT: 28.2 % — ABNORMAL LOW (ref 39.0–52.0)
Hemoglobin: 9 g/dL — ABNORMAL LOW (ref 13.0–17.0)
IMMATURE GRANULOCYTES: 2 %
LYMPHS PCT: 12 %
Lymphs Abs: 0.3 10*3/uL — ABNORMAL LOW (ref 0.7–4.0)
MCH: 33 pg (ref 26.0–34.0)
MCHC: 31.9 g/dL (ref 30.0–36.0)
MCV: 103.3 fL — ABNORMAL HIGH (ref 80.0–100.0)
Monocytes Absolute: 0.4 10*3/uL (ref 0.1–1.0)
Monocytes Relative: 17 %
NEUTROS ABS: 1.5 10*3/uL — AB (ref 1.7–7.7)
NEUTROS PCT: 67 %
Platelets: 69 10*3/uL — ABNORMAL LOW (ref 150–400)
RBC: 2.73 MIL/uL — ABNORMAL LOW (ref 4.22–5.81)
RDW: 13.1 % (ref 11.5–15.5)
WBC: 2.2 10*3/uL — ABNORMAL LOW (ref 4.0–10.5)
nRBC: 0 % (ref 0.0–0.2)

## 2018-03-22 LAB — BASIC METABOLIC PANEL
Anion gap: 13 (ref 5–15)
BUN: 40 mg/dL — ABNORMAL HIGH (ref 8–23)
CHLORIDE: 97 mmol/L — AB (ref 98–111)
CO2: 29 mmol/L (ref 22–32)
Calcium: 8.4 mg/dL — ABNORMAL LOW (ref 8.9–10.3)
Creatinine, Ser: 5.01 mg/dL — ABNORMAL HIGH (ref 0.61–1.24)
GFR calc Af Amer: 13 mL/min — ABNORMAL LOW (ref >60)
GFR calc non Af Amer: 11 mL/min — ABNORMAL LOW (ref >60)
Glucose, Bld: 112 mg/dL — ABNORMAL HIGH (ref 70–99)
Potassium: 4.1 mmol/L (ref 3.5–5.1)
Sodium: 139 mmol/L (ref 135–145)

## 2018-03-22 LAB — GLUCOSE, CAPILLARY
GLUCOSE-CAPILLARY: 106 mg/dL — AB (ref 70–99)
Glucose-Capillary: 142 mg/dL — ABNORMAL HIGH (ref 70–99)
Glucose-Capillary: 109 mg/dL — ABNORMAL HIGH (ref 70–99)
Glucose-Capillary: 107 mg/dL — ABNORMAL HIGH (ref 70–99)

## 2018-03-22 LAB — MAGNESIUM: Magnesium: 2.2 mg/dL (ref 1.7–2.4)

## 2018-03-22 MED ORDER — CHLORHEXIDINE GLUCONATE CLOTH 2 % EX PADS: 6.0000 | MEDICATED_PAD | Freq: Every day | CUTANEOUS | Status: DC

## 2018-03-22 NOTE — Progress Notes (Signed)
Paged Dr. Tana Coast about pt. Having a 2.6 second pasue as well as HR dropping to 30. 12 lead EKG, Magnesium, and Basic metabolic panel have been ordered STAT. Patient remains stable. Nurse will continue to monitor.

## 2018-03-22 NOTE — Progress Notes (Signed)
Physical Therapy Treatment Patient Details Name: [name] MRN: [id number] DOB: [birth year] Today's Date: 03/22/2018    History of Present Illness 67 y.o. male with medical history significant of anemia, osteoarthritis, bilateral eye blindness, CAD/CABG, history of cellulitis to lower extremities multiple times, colon polyps, diabetic peripheral neuropathy, type 2 diabetes, ESRD on hemodialysis, history of fall, hyperlipidemia, hypertension, urolithiasis, prostate cancer, peripheral vascular disease, renal cell carcinoma. He presented with falls and diarrhea. Admitted with concern for HCAP    PT Comments    Pt is improving slowly, but likely not at baseline due to still relatively inactive.  Have asked nursing staff to help him mobilize.   Follow Up Recommendations  Home health PT;Supervision/Assistance - 24 hour;Supervision for mobility/OOB     Equipment Recommendations  None recommended by PT    Recommendations for Other Services       Precautions / Restrictions Precautions Precautions: Fall Precaution Comments: multiple bruises Restrictions Weight Bearing Restrictions: No    Mobility  Bed Mobility Overal bed mobility: Needs Assistance Bed Mobility: Supine to Sit;Sit to Supine(Simultaneous filing. User may not have seen previous data.)     Supine to sit: Mod assist Sit to supine: Mod assist(Simultaneous filing. User may not have seen previous data.)   General bed mobility comments: assist for LEs and to elevate trunk  Transfers Overall transfer level: Needs assistance Equipment used: 4-wheeled walker Transfers: Sit to/from Stand Sit to Stand: Mod assist;Min assist(from standard lower surface)         General transfer comment: pt needed assist to come forward once in standing.  Ambulation/Gait Ambulation/Gait assistance: Min assist Gait Distance (Feet): 90 Feet Assistive device: 4-wheeled walker Gait Pattern/deviations: Step-through pattern Gait  velocity: slower Gait velocity interpretation: <1.8 ft/sec, indicate of risk for recurrent falls General Gait Details: mildly weak gait with knee extension lag bil.  mildly unsteady with consistent flexed posture.   Stairs             Wheelchair Mobility    Modified Rankin (Stroke Patients Only)       Balance Overall balance assessment: Needs assistance;History of Falls(states he "slid", not a "Fall")   Sitting balance-Leahy Scale: Fair Sitting balance - Comments: Initially(first 5-8 min at EOB pt with list to the L when not holding to rail or using at least 1 UE, but became more steady with time up at Eob(Simultaneous filing. User may not have seen previous data.)     Standing balance-Leahy Scale: Poor Standing balance comment: reliant on his rollator; stood at end of session `3-4 min for peri care.                            Cognition Arousal/Alertness: Awake/alert Behavior During Therapy: Flat affect Overall Cognitive Status: No family/caregiver present to determine baseline cognitive functioning                                 General Comments: most likely baseline      Exercises      General Comments General comments (skin integrity, edema, etc.): EHR in the 50's and 60's bpm, sats with gait 98%      Pertinent Vitals/Pain Pain Assessment: No/denies pain    Home Living                      Prior Function  PT Goals (current goals can now be found in the care plan section) Acute Rehab PT Goals Patient Stated Goal: to go home PT Goal Formulation: With patient Time For Goal Achievement: 03/27/18 Potential to Achieve Goals: Good Progress towards PT goals: Progressing toward goals    Frequency    Min 3X/week      PT Plan Current plan remains appropriate    Co-evaluation              AM-PAC PT "6 Clicks" Mobility   Outcome Measure  Help needed turning from your back to your side while in a  flat bed without using bedrails?: A Little Help needed moving from lying on your back to sitting on the side of a flat bed without using bedrails?: A Lot Help needed moving to and from a bed to a chair (including a wheelchair)?: A Little Help needed standing up from a chair using your arms (e.g., wheelchair or bedside chair)?: A Little Help needed to walk in hospital room?: A Little Help needed climbing 3-5 steps with a railing? : A Little 6 Click Score: 17    End of Session   Activity Tolerance: Patient tolerated treatment well;Patient limited by fatigue Patient left: in chair;with call bell/phone within reach;with chair alarm set Nurse Communication: Mobility status;Other (comment)(asked nursing to ambulate pt more.) PT Visit Diagnosis: Unsteadiness on feet (R26.81);Muscle weakness (generalized) (M62.81);Difficulty in walking, not elsewhere classified (R26.2)     Time: 7076-1518 PT Time Calculation (min) (ACUTE ONLY): 21 min  Charges:  $Gait Training: 8-22 mins                     03/22/2018  Donnella Sham, PT Acute Rehabilitation Services [phone]  (pager) [phone]  (office)   [name] 03/22/2018, 5:26 PM

## 2018-03-22 NOTE — Progress Notes (Signed)
[facility] Progress Note  Subjective: seen in room  Vitals:   03/22/18 0010 03/22/18 0435 03/22/18 0810 03/22/18 1112  BP: (!) 97/46 (!) 122/59 120/64 (!) 118/53  Pulse: (!) 57 (!) 110 (!) 57 (!) 57  Resp: 19 (!) 25 (!) 23 20  Temp: 98.4 F (36.9 C) 98.4 F (36.9 C) 98.5 F (36.9 C)   TempSrc: Axillary Oral Axillary   SpO2: 100% 100% 97% 98%  Weight:      Height:        Inpatient medications: . aspirin EC  81 mg Oral QHS  . Chlorhexidine Gluconate Cloth  6 each Topical Q0600  . darbepoetin (ARANESP) injection - NON-DIALYSIS  60 mcg Subcutaneous Q Wed-1800  . guaiFENesin  1,200 mg Oral BID  . insulin aspart  0-9 Units Subcutaneous TID WC  . midodrine  10 mg Oral TID WC  . multivitamin  1 tablet Oral Daily  . pantoprazole  40 mg Oral BID AC  . sevelamer carbonate  4,800 mg Oral TID WC   . ceFEPime (MAXIPIME) IV Stopped (03/21/18 2127)   acetaminophen **OR** acetaminophen, ondansetron **OR** ondansetron (ZOFRAN) IV, sevelamer carbonate  Iron/TIBC/Ferritin/ %Sat    Component Value Date/Time   IRON 26 (L) 04/12/2016 0657   TIBC 200 (L) 04/12/2016 0657   FERRITIN 1,350 (H) 04/12/2016 0657   IRONPCTSAT 13 (L) 04/12/2016 7989    Exam: Gen alert, frail older adult male not in distress, chronically ill, alert No jvd or bruits Chest coarse rhonchi / congestion bilat, no ^wob RRR no MRG +sternotomy scar Abd soft ntnd no mass or ascites +bs Ext R TMA, no LE edema, no wounds or ulcers Neuro awakens easily , gen'd weakness and deconditioning chronic L AVF+bruit   Home meds:  - aspirin 81/ atorvastatin 10/ MVI/ sevelamer carb ac tid  - midodrine 10 tid   Dialysis: MWF [facility]  4h  450/800   90kg  No heparin  LUE AVF  - vit D 2.5 ug tiw  -parsabiv 15 tiw IV  - midodrine 10 tid  - last Hb 11.6     Impression/ Plan:  PNA - per primary.  Fevers resolving slowly.   ESRD on HD: usual HD MWF. HD Friday   Volume - no edema on CXR or exam. At dry  wt. Try to lower a bit   PAD sp TMA  H/o renal cell cancer  MBD ckd - cont meds  Anemia ckd - not on esa at clinic, Hb 9's here. Give 1 dose esa 60 ug today  Chron hypotension - on midodrine tid  Blindness  Chronic debility - was at home prior to admission   Kelly Splinter MD [place] pager [phone]   03/22/2018, 2:52 PM   Recent Labs  Lab 03/19/18 0720 03/21/18 0625  NA 137 134*  K 4.5 4.0  CL 98 96*  CO2 23 25  GLUCOSE 89 92  BUN 70* 53*  CREATININE 6.95* 6.11*  CALCIUM 8.4* 8.0*  PHOS 4.4 2.9  ALBUMIN 2.8* 2.6*   Recent Labs  Lab 03/16/18 0801 03/17/18 0422  AST 19 21  ALT 25 23  ALKPHOS 106 96  BILITOT 1.1 0.9  PROT 6.1* 5.7*   Recent Labs  Lab 03/21/18 0453 03/22/18 0506  WBC 2.5* 2.2*  NEUTROABS 1.7 1.5*  HGB 8.9* 9.0*  HCT 28.1* 28.2*  MCV 104.1* 103.3*  PLT 65* 69*

## 2018-03-22 NOTE — Progress Notes (Signed)
Occupational Therapy Treatment Patient Details Name: [name] MRN: [id number] DOB: 1950-09-04 Today's Date: 03/22/2018    History of present illness (P) 67 y.o. male with medical history significant of anemia, osteoarthritis, bilateral eye blindness, CAD/CABG, history of cellulitis to lower extremities multiple times, colon polyps, diabetic peripheral neuropathy, type 2 diabetes, ESRD on hemodialysis, history of fall, hyperlipidemia, hypertension, urolithiasis, prostate cancer, peripheral vascular disease, renal cell carcinoma. He presented with falls and diarrhea. Admitted with concern for HCAP   OT comments  Pt progressing towards OT goals and motivated to work with therapy. Pt requiring modA for bed mobility and minA for stand pivot transfers using rollator. Pt requiring setup assist for seated UB ADL and maxA for LB ADLs. Feel POC remains appropriate at this time. Will continue to follow acutely to progress pt towards established OT goals.   Follow Up Recommendations  Home health OT;Supervision/Assistance - 24 hour    Equipment Recommendations  None recommended by OT          Precautions / Restrictions Precautions Precautions: (P) Fall Precaution Comments: (P) multiple bruises Restrictions Weight Bearing Restrictions: No       Mobility Bed Mobility Overal bed mobility: (P) Needs Assistance Bed Mobility: (P) Supine to Sit;Sit to Supine     Supine to sit: (P) Mod assist Sit to supine: (P) Mod assist   General bed mobility comments: assist for LEs and to elevate trunk  Transfers Overall transfer level: (P) Needs assistance Equipment used: 4-wheeled walker Transfers: (P) Sit to/from Stand;Stand Pivot Transfers Sit to Stand: (P) From elevated surface;Mod assist(from standard lower surface)         General transfer comment: boosting assist and assist to steady at rollator; steadying assist provided to turn and back up towards recliner    Balance Overall  balance assessment: (P) Needs assistance;History of Falls(states he "slid", not a "Fall")   Sitting balance-Leahy Scale: (P) Fair Sitting balance - Comments: (P) Initially(first 5-8 min at EOB pt with list to the L when not holding to rail or using at least 1 UE, but became more steady with time up at Eob     Standing balance-Leahy Scale: (P) Poor                             ADL either performed or assessed with clinical judgement   ADL Overall ADL's : Needs assistance/impaired     Grooming: Wash/dry face;Set up;Sitting               Lower Body Dressing: Maximal assistance;Sit to/from stand Lower Body Dressing Details (indicate cue type and reason): assist to don shoes seated EOB             Functional mobility during ADLs: Minimal assistance(rollator; elevated surface)       Vision       Perception     Praxis      Cognition Arousal/Alertness: (P) Awake/alert Behavior During Therapy: (P) Flat affect Overall Cognitive Status: (P) No family/caregiver present to determine baseline cognitive functioning                                 General Comments: (P) most likely baseline        Exercises     Shoulder Instructions       General Comments VSS during session; pt on RA    Pertinent Vitals/ Pain  Pain Assessment: No/denies pain  Home Living                                          Prior Functioning/Environment              Frequency  Min 2X/week        Progress Toward Goals  OT Goals(current goals can now be found in the care plan section)  Progress towards OT goals: Progressing toward goals  Acute Rehab OT Goals Patient Stated Goal: (P) to go home OT Goal Formulation: With patient Time For Goal Achievement: 04/03/18 Potential to Achieve Goals: Good  Plan Discharge plan remains appropriate    Co-evaluation                 AM-PAC OT "6 Clicks" Daily Activity     Outcome  Measure   Help from another person eating meals?: A Little Help from another person taking care of personal grooming?: A Little Help from another person toileting, which includes using toliet, bedpan, or urinal?: A Lot Help from another person bathing (including washing, rinsing, drying)?: A Lot Help from another person to put on and taking off regular upper body clothing?: A Little Help from another person to put on and taking off regular lower body clothing?: A Lot 6 Click Score: 15    End of Session Equipment Utilized During Treatment: Gait belt;Rolling walker  OT Visit Diagnosis: Unsteadiness on feet (R26.81);Muscle weakness (generalized) (M62.81);History of falling (Z91.81)   Activity Tolerance Patient tolerated treatment well   Patient Left in chair;with call bell/phone within reach;with chair alarm set   Nurse Communication Mobility status        Time: 7846-9629 OT Time Calculation (min): 28 min  Charges: OT General Charges $OT Visit: 1 Visit OT Treatments $Self Care/Home Management : 23-37 mins  Lou Cal, OT Supplemental Rehabilitation Services Pager [phone] Office [phone]    [name] 03/22/2018, 5:23 PM

## 2018-03-22 NOTE — Care Management Note (Signed)
Case Management Note  Patient Details  Name: [name] MRN: [id number] Date of Birth: June 16, 1950  Subjective/Objective:                    Action/Plan: CM spoke to [name] over the phone and provided her choice of [facility] agencies. She is interested in using [name] with [facility] notified and accepted the referral. CM following for further needs as he nears ready for d/c.   Expected Discharge Date:                  Expected Discharge Plan:  [name]  In-House Referral:     Discharge planning Services  CM Consult  Post Acute Care Choice:  Home Health Choice offered to:     DME Arranged:    DME Agency:     HH Arranged:    [place]:  [place]  Status of Service:  In process, will continue to follow  If discussed at Long Length of Stay Meetings, dates discussed:    Additional Comments:  Pollie Friar, RN 03/22/2018, 2:12 PM

## 2018-03-22 NOTE — Progress Notes (Signed)
  Echocardiogram 2D Echocardiogram has been performed.  [name] 03/22/2018, 11:35 AM

## 2018-03-22 NOTE — Progress Notes (Signed)
[facility]                                                                              Patient Demographics  [name], is a 67 y.o. male, DOB - 07-Aug-1950, HWE:[id number]  Admit date - 03/16/2018   Admitting Physician Reubin Milan, MD  Outpatient Primary MD for the patient is [name], MD  Outpatient specialists:   LOS - 6  days   Medical records reviewed and are as summarized below:    Chief Complaint  Patient presents with  . Diarrhea  . Weakness       Brief summary   [name] is a 67 y.o. malewith medical history significant ofanemia, osteoarthritis, bilateral eye blindness, CAD/CABG, history of cellulitis to lower extremities multiple times, colon polyps, diabetic peripheral neuropathy, type 2 diabetes, ESRD on hemodialysis, history of fall, hyperlipidemia, hypertension, urolithiasis, prostate cancer, peripheral vascular disease, renal cell carcinoma. He presented with falls and diarrhea. Admitted with concern for HCAP and treatment started. C. Difficile negative with blood cultures no growth to date.   Assessment & Plan    Healthcare associated pneumonia -Presented with H CAP, chest x-ray however was clear. -Repeat chest x-ray on 12/17 showed infiltrates in the left base, developing pneumonia or aspiration. -Still coarse rhonchi, obtain SLP evaluation to rule out aspiration, continue cefepime, Mucinex, flutter valve   Generalized debility -Per family, patient ambulates with a walker at baseline, balance difficulty secondary to transmetatarsal amputation -Follow PT OT   Sinus pauses with bigeminy -3.09sec pause on telemetry on 12/18.  Patient has been followed closely by cardiology, last seen on 02/15/2018 by Dr. Percival Spanish -Patient was noted to have bradycardia, per Dr. Rosezella [place] notes, patient had Holter monitor and was noted to have a heart rate of 29 especially with episodic 2 is to 1 block, sinus bradycardia -However  because of severe comorbid conditions including ESRD, recommended no change in therapy, high risk for pacing.  -Avoid AV nodal blocking drugs, keep potassium close to 4, magnesium above 2, follow 2D echo   ESRD -On hemodialysis MWF, nephrology consulted  Hypotension Secondary to HD, continue midodrine  Anemia of chronic disease/renal disease H&H currently stable  History of CAD Currently no chest pain, continue Lipitor, aspirin  Blindness Chronic per chart review.  Diabetes mellitus -Type II, continue sliding scale insulin CBGs controlled  Pancytopenia Has chronic anemia with leukopenia and thrombocytopenia -Follow CBC    Pressure injury of skin Coccygeal and perineal skin ulceration stage III Continue wound care  Code Status: Full DVT Prophylaxis: SCDs Family Communication: Discussed in detail with the patient, all imaging results, lab results explained to the patient    Disposition Plan: Hopefully in a.m. after HD, 2D echo  Time Spent in minutes 25 minutes  Procedures:  HD  Consultants:   Nephrology  Antimicrobials:      Medications  Scheduled Meds: . aspirin EC  81 mg Oral QHS  . Chlorhexidine Gluconate Cloth  6 each Topical Q0600  . darbepoetin (ARANESP) injection - NON-DIALYSIS  60 mcg Subcutaneous Q Wed-1800  . guaiFENesin  1,200 mg Oral BID  . insulin aspart  0-9 Units Subcutaneous TID WC  . midodrine  10 mg Oral TID WC  . multivitamin  1 tablet Oral Daily  . pantoprazole  40 mg Oral BID AC  . sevelamer carbonate  4,800 mg Oral TID WC   Continuous Infusions: . ceFEPime (MAXIPIME) IV Stopped (03/21/18 2127)   PRN Meds:.acetaminophen **OR** acetaminophen, ondansetron **OR** ondansetron (ZOFRAN) IV, sevelamer carbonate   Antibiotics   Anti-infectives (From admission, onward)   Start     Dose/Rate Route Frequency Ordered Stop   03/19/18 2000  ceFEPIme (MAXIPIME) 2 g in sodium chloride 0.9 % 100 mL IVPB  Status:  Discontinued     2 g 200  mL/hr over 30 Minutes Intravenous Every M-W-F (2000) 03/16/18 1346 03/17/18 1059   03/19/18 1200  vancomycin (VANCOCIN) IVPB 1000 mg/200 mL premix  Status:  Discontinued     1,000 mg 200 mL/hr over 60 Minutes Intravenous Every M-W-F (Hemodialysis) 03/16/18 1346 03/17/18 1059   03/17/18 2000  ceFEPIme (MAXIPIME) 2 g in sodium chloride 0.9 % 100 mL IVPB     2 g 200 mL/hr over 30 Minutes Intravenous Every M-W-F (2000) 03/17/18 1059     03/17/18 1200  vancomycin (VANCOCIN) IVPB 1000 mg/200 mL premix  Status:  Discontinued     1,000 mg 200 mL/hr over 60 Minutes Intravenous Every M-W-F (Hemodialysis) 03/17/18 1059 03/19/18 1642   03/17/18 1100  cefTRIAXone (ROCEPHIN) 1 g in sodium chloride 0.9 % 100 mL IVPB  Status:  Discontinued     1 g 200 mL/hr over 30 Minutes Intravenous Every 24 hours 03/16/18 1049 03/16/18 1204   03/17/18 0900  azithromycin (ZITHROMAX) 500 mg in sodium chloride 0.9 % 250 mL IVPB  Status:  Discontinued     500 mg 250 mL/hr over 60 Minutes Intravenous Every 24 hours 03/16/18 1049 03/16/18 1049   03/16/18 1345  ceFEPIme (MAXIPIME) 2 g in sodium chloride 0.9 % 100 mL IVPB     2 g 200 mL/hr over 30 Minutes Intravenous  Once 03/16/18 1338 03/16/18 1508   03/16/18 1345  vancomycin (VANCOCIN) 2,000 mg in sodium chloride 0.9 % 500 mL IVPB     2,000 mg 250 mL/hr over 120 Minutes Intravenous  Once 03/16/18 1343 03/16/18 1800   03/16/18 1049  azithromycin (ZITHROMAX) 500 mg in sodium chloride 0.9 % 250 mL IVPB  Status:  Discontinued     500 mg 250 mL/hr over 60 Minutes Intravenous Every 24 hours 03/16/18 1049 03/16/18 1204   03/16/18 1045  cefTRIAXone (ROCEPHIN) 1 g in sodium chloride 0.9 % 100 mL IVPB     1 g 200 mL/hr over 30 Minutes Intravenous  Once 03/16/18 1033 03/16/18 1412   03/16/18 1045  azithromycin (ZITHROMAX) tablet 500 mg  Status:  Discontinued     500 mg Oral  Once 03/16/18 1033 03/16/18 1048        Subjective:   [name] was seen and examined today.  No  complaints, still has coarse rhonchi bilaterally.  Denies any dizziness, chest pain, nausea vomiting or diarrhea constipation.  No acute events overnight.  Objective:   Vitals:   03/22/18 0010 03/22/18 0435 03/22/18 0810 03/22/18 1112  BP: (!) 97/46 (!) 122/59 120/64 (!) 118/53  Pulse: (!) 57 (!) 110 (!) 57 (!) 57  Resp: 19 (!) 25 (!) 23 20  Temp: 98.4 F (36.9 C) 98.4 F (36.9 C) 98.5 F (36.9 C)   TempSrc: Axillary Oral Axillary   SpO2: 100% 100% 97% 98%  Weight:  Height:        Intake/Output Summary (Last 24 hours) at 03/22/2018 1227 Last data filed at 03/22/2018 0854 Gross per 24 hour  Intake 820 ml  Output -  Net 820 ml     Wt Readings from Last 3 Encounters:  03/21/18 90 kg  02/20/18 91.6 kg  02/15/18 91.6 kg   Physical Exam  General: Alert and oriented x 3, NAD  Eyes:   HEENT:    Cardiovascular: S1 S2 auscultated, Regular rate and rhythm. No pedal edema b/l  Respiratory: Coarse rhonchi bilaterally  Gastrointestinal: Soft, nontender, nondistended, + bowel sounds  Ext: no pedal edema bilaterally  Neuro: AAOx3, Cr N's II- XII. Strength 5/5 upper and lower extremities bilaterally, speech clear, sensations grossly intact  Musculoskeletal: No digital cyanosis, clubbing  Skin: Coccygeal pressure ulcer  Psych: Normal affect and demeanor, alert and oriented x3    Data Reviewed:  I have personally reviewed following labs and imaging studies  Micro Results Recent Results (from the past 240 hour(s))  Blood Culture (routine x 2)     Status: None   Collection Time: 03/16/18  8:01 AM  Result Value Ref Range Status   Specimen Description RIGHT ANTECUBITAL IV  Final   Special Requests   Final    BOTTLES DRAWN AEROBIC AND ANAEROBIC Blood Culture results may not be optimal due to an inadequate volume of blood received in culture bottles   Culture   Final    NO GROWTH 5 DAYS Performed at [facility], [place] [address] [place], [place] [address]     Report Status 03/21/2018 FINAL  Final  C difficile quick scan w PCR reflex     Status: None   Collection Time: 03/16/18  8:06 AM  Result Value Ref Range Status   C Diff antigen NEGATIVE NEGATIVE Final   C Diff toxin NEGATIVE NEGATIVE Final   C Diff interpretation No C. difficile detected.  Final    Comment: Performed at [facility], [place] [address] [place], [place] [address]  MRSA PCR Screening     Status: None   Collection Time: 03/16/18  5:45 PM  Result Value Ref Range Status   MRSA by PCR NEGATIVE NEGATIVE Final    Comment:        The GeneXpert MRSA Assay (FDA approved for NASAL specimens only), is one component of a comprehensive MRSA colonization surveillance program. It is not intended to diagnose MRSA infection nor to guide or monitor treatment for MRSA infections. Performed at [facility], [place] [address], [place], [place] [address]   Blood Culture (routine x 2)     Status: None   Collection Time: 03/16/18  6:03 PM  Result Value Ref Range Status   Specimen Description BLOOD RIGHT HAND  Final   Special Requests   Final    BOTTLES DRAWN AEROBIC AND ANAEROBIC Blood Culture adequate volume   Culture   Final    NO GROWTH 5 DAYS Performed at [facility], [place] [address] [place], [place] [address]    Report Status 03/21/2018 FINAL  Final    Radiology Reports Dg Chest 2 View  Result Date: 03/20/2018 CLINICAL DATA:  Acute respiratory failure.  Hypoxia. EXAM: CHEST - 2 VIEW COMPARISON:  March 16, 2018 FINDINGS: The heart size borderline. The hila and mediastinum are unremarkable. Mild opacity identified in the left base. No other acute abnormalities or changes. IMPRESSION: Mild infiltrate in the left base suspicious for developing pneumonia  or aspiration. Recommend clinical correlation and follow-up to resolution. Electronically Signed   By: Dorise Bullion III M.D   On: 03/20/2018 17:22   Dg Chest Port 1 View  Result Date: 03/16/2018 CLINICAL DATA:   Multiple falls EXAM: PORTABLE CHEST 1 VIEW COMPARISON:  Portable exam 0803 hours compared to 04/18/2017 FINDINGS: Upper normal heart size post CABG. Mediastinal contours and pulmonary vascularity normal. RIGHT basilar atelectasis. Mild chronic elevation of RIGHT diaphragm. Remaining lungs clear. No acute infiltrate, pleural effusion or pneumothorax. Bones demineralized. IMPRESSION: RIGHT basilar atelectasis. Electronically Signed   By: Lavonia Dana M.D.   On: 03/16/2018 08:24    Lab Data:  CBC: Recent Labs  Lab 03/18/18 0521 03/19/18 0335 03/20/18 0603 03/21/18 0453 03/22/18 0506  WBC 3.5* 3.3* 2.0* 2.5* 2.2*  NEUTROABS 2.9 2.8 1.5* 1.7 1.5*  HGB 9.3* 10.0* 9.0* 8.9* 9.0*  HCT 29.7* 32.3* 28.3* 28.1* 28.2*  MCV 104.6* 107.7* 104.8* 104.1* 103.3*  PLT 56* 59* 62* 65* 69*   Basic Metabolic Panel: Recent Labs  Lab 03/16/18 0801 03/17/18 0422 03/19/18 0720 03/21/18 0625  NA 144 144 137 134*  K 3.7 3.9 4.5 4.0  CL 99 103 98 96*  CO2 26 25 23 25   GLUCOSE 144* 108* 89 92  BUN 78* 92* 70* 53*  CREATININE 7.58* 8.93* 6.95* 6.11*  CALCIUM 8.7* 8.7* 8.4* 8.0*  MG  --   --   --  2.1  PHOS  --   --  4.4 2.9   GFR: Estimated Creatinine Clearance: 14.2 mL/min (A) (by C-G formula based on SCr of 6.11 mg/dL (H)). Liver Function Tests: Recent Labs  Lab 03/16/18 0801 03/17/18 0422 03/19/18 0720 03/21/18 0625  AST 19 21  --   --   ALT 25 23  --   --   ALKPHOS 106 96  --   --   BILITOT 1.1 0.9  --   --   PROT 6.1* 5.7*  --   --   ALBUMIN 3.7 3.1* 2.8* 2.6*   No results for input(s): LIPASE, AMYLASE in the last 168 hours. No results for input(s): AMMONIA in the last 168 hours. Coagulation Profile: No results for input(s): INR, PROTIME in the last 168 hours. Cardiac Enzymes: No results for input(s): CKTOTAL, CKMB, CKMBINDEX, TROPONINI in the last 168 hours. BNP (last 3 results) No results for input(s): PROBNP in the last 8760 hours. HbA1C: No results for input(s): HGBA1C in the  last 72 hours. CBG: Recent Labs  Lab 03/21/18 1422 03/21/18 1750 03/21/18 2144 03/22/18 0656 03/22/18 1131  GLUCAP 113* 135* 172* 106* 142*   Lipid Profile: No results for input(s): CHOL, HDL, LDLCALC, TRIG, CHOLHDL, LDLDIRECT in the last 72 hours. Thyroid Function Tests: No results for input(s): TSH, T4TOTAL, FREET4, T3FREE, THYROIDAB in the last 72 hours. Anemia Panel: No results for input(s): VITAMINB12, FOLATE, FERRITIN, TIBC, IRON, RETICCTPCT in the last 72 hours. Urine analysis:    Component Value Date/Time   COLORURINE YELLOW 11/01/2015 1917   APPEARANCEUR CLOUDY (A) 11/01/2015 1917   LABSPEC 1.012 11/01/2015 1917   PHURINE 7.0 11/01/2015 1917   GLUCOSEU NEGATIVE 11/01/2015 1917   HGBUR SMALL (A) 11/01/2015 1917   BILIRUBINUR NEGATIVE 11/01/2015 1917   KETONESUR NEGATIVE 11/01/2015 1917   PROTEINUR 100 (A) 11/01/2015 1917   UROBILINOGEN 0.2 07/28/2014 1007   NITRITE NEGATIVE 11/01/2015 1917   LEUKOCYTESUR LARGE (A) 11/01/2015 1917     Estill Cotta M.D. [facility] 03/22/2018, 12:27 PM  Pager: [phone] Between 7am  to 7pm - call Pager - [phone]  After 7pm go to [email or web address] - password [email or web address]  Call night coverage person covering after 7pm

## 2018-03-23 LAB — CBC WITH DIFFERENTIAL/PLATELET
Abs Immature Granulocytes: 0.07 10*3/uL (ref 0.00–0.07)
Basophils Absolute: 0 10*3/uL (ref 0.0–0.1)
Basophils Relative: 0 %
EOS ABS: 0.1 10*3/uL (ref 0.0–0.5)
Eosinophils Relative: 3 %
HCT: 27.9 % — ABNORMAL LOW (ref 39.0–52.0)
Hemoglobin: 9.1 g/dL — ABNORMAL LOW (ref 13.0–17.0)
Immature Granulocytes: 2 %
Lymphocytes Relative: 13 %
Lymphs Abs: 0.4 10*3/uL — ABNORMAL LOW (ref 0.7–4.0)
MCH: 34.1 pg — ABNORMAL HIGH (ref 26.0–34.0)
MCHC: 32.6 g/dL (ref 30.0–36.0)
MCV: 104.5 fL — ABNORMAL HIGH (ref 80.0–100.0)
Monocytes Absolute: 0.6 10*3/uL (ref 0.1–1.0)
Monocytes Relative: 19 %
Neutro Abs: 2.1 10*3/uL (ref 1.7–7.7)
Neutrophils Relative %: 63 %
PLATELETS: 81 10*3/uL — AB (ref 150–400)
RBC: 2.67 MIL/uL — ABNORMAL LOW (ref 4.22–5.81)
RDW: 13 % (ref 11.5–15.5)
WBC: 3.3 10*3/uL — ABNORMAL LOW (ref 4.0–10.5)
nRBC: 0 % (ref 0.0–0.2)

## 2018-03-23 LAB — GLUCOSE, CAPILLARY
GLUCOSE-CAPILLARY: 85 mg/dL (ref 70–99)
Glucose-Capillary: 82 mg/dL (ref 70–99)

## 2018-03-23 LAB — ECHOCARDIOGRAM COMPLETE
Height: 76 in
Weight: 3174.62 oz

## 2018-03-23 MED ORDER — AMOXICILLIN-POT CLAVULANATE 500-125 MG PO TABS: 500.0000 mg | ORAL_TABLET | Freq: Every evening | ORAL | 0 refills | Status: AC

## 2018-03-23 MED ORDER — MIDODRINE HCL 5 MG PO TABS
ORAL_TABLET | ORAL | Status: AC
  Administered 2018-03-23: 10 mg via ORAL
  Filled 2018-03-23: qty 2

## 2018-03-23 MED ORDER — PANTOPRAZOLE SODIUM 40 MG PO TBEC: 40.0000 mg | DELAYED_RELEASE_TABLET | Freq: Every day | ORAL | 3 refills | Status: DC

## 2018-03-23 MED ORDER — DM-GUAIFENESIN ER 30-600 MG PO TB12: 1.0000 | ORAL_TABLET | Freq: Two times a day (BID) | ORAL | 1 refills | Status: DC

## 2018-03-23 MED ORDER — ACETAMINOPHEN 325 MG PO TABS
ORAL_TABLET | ORAL | Status: AC
  Filled 2018-03-23: qty 2

## 2018-03-23 NOTE — Progress Notes (Signed)
Pt discharge education and instructions completed with pt and son at bedside; both voices understanding and denies any questions. Pt IV and telemetry removed; pt discharge home and son to transport him home. Pt awaiting on his prescriptions for protonix and guaifenesin. MD notified. Delia Heady RN

## 2018-03-23 NOTE — Consult Note (Signed)
   [facility] Inpatient Consult   03/23/2018  [place] [birth year] [id number]    Made aware by Tommi Rumps with [facility] that patient will enroll in the [employer] program.   Will send notification to [place] Management office to make aware.   Marthenia Rolling, MSN-Ed, RN,BSN [facility] Liaison [phone]

## 2018-03-23 NOTE — Consult Note (Signed)
            [facility] CM Primary Care Navigator  03/23/2018  [place] [birth year] [id number]   Went to see patient at the bedside to identify possible discharge needs but he is in hemodialysis (HD) at the moment per staff.  Will attempt to see patient at another time when available in the room.    Addendum:  Attempt to go back and see patient for possible discharge needs but he was already discharged to home.   Per [facility] hospital liaison's note, patient will be enrolled with [place] program (per [facility]) to follow him up at home.   For additional questions please contact:  Edwena Felty A. Carder Yin, BSN, RN-BC [facility] PRIMARY CARE Navigator Cell: [phone]

## 2018-03-23 NOTE — Discharge Summary (Addendum)
Physician Discharge Summary   Patient ID: [name] MRN: [id number] DOB/AGE: 1950-06-06 67 y.o.  Admit date: 03/16/2018 Discharge date: 03/23/2018  Primary Care Physician:  [name], MD   Recommendations for Outpatient Follow-up:  1. Follow up with PCP in 1-2 weeks 2. Please obtain chest x-ray in 2 to 3 weeks to ensure complete resolution of pneumonia  Home Health: Home health PT OT, RN, home health aide Equipment/Devices:   Discharge Condition: stable  CODE STATUS: FULL  Diet recommendation: Heart healthy carb modified diet   Discharge Diagnoses:    . HCAP (healthcare-associated pneumonia) . ANEMIA . Bilateral blindness . CAD in native artery . Hypotension . Other pancytopenia ([place]) Generalized debility ESRD MWF on hemodialysis Sinus pauses with bigeminy  Consults: Nephrology    Allergies:   Allergies  Allergen Reactions  . Codeine Other (See Comments)     [place]  . Tape Other (See Comments)    Plastic tape tears skin off, please use paper tape instead.     DISCHARGE MEDICATIONS: Allergies as of 03/23/2018      Reactions   Codeine Other (See Comments)    STATES MAKES HIM COMATOSE   Tape Other (See Comments)   Plastic tape tears skin off, please use paper tape instead.      Medication List    TAKE these medications   ALIGN 4 MG Caps Take 1 capsule by mouth daily.   amoxicillin-clavulanate 500-125 MG tablet Commonly known as:  AUGMENTIN Take 1 tablet (500 mg total) by mouth every evening for 5 days.   aspirin EC 81 MG tablet Take 81 mg by mouth at bedtime.   atorvastatin 10 MG tablet Commonly known as:  LIPITOR Take 10 mg by mouth daily.   dextromethorphan-guaiFENesin 30-600 MG 12hr tablet Commonly known as:  MUCINEX DM Take 1 tablet by mouth 2 (two) times daily.   midodrine 10 MG tablet Commonly known as:  PROAMATINE Take 1 tablet (10 mg total) by mouth 3 (three) times daily with meals.   MIRCERA 50  MCG/0.3ML Sosy Generic drug:  Methoxy PEG-Epoetin Beta Inject 225 mcg as directed every 14 (fourteen) days.   multivitamin Tabs tablet Take 1 tablet by mouth daily.   pantoprazole 40 MG tablet Commonly known as:  PROTONIX Take 1 tablet (40 mg total) by mouth daily before breakfast.   sevelamer carbonate 800 MG tablet Commonly known as:  RENVELA Take 2,400-4,800 mg by mouth See admin instructions. Take 6 tablets (4800 mg) by mouth 3 times daily with meals and 3 tablets (2400 mg) 2 times daily with snacks        Brief H and P: For complete details please refer to admission H and P, but in brief [name] a 67 y.o.malewith medical history significant ofanemia, osteoarthritis, bilateral eye blindness, CAD/CABG, history of cellulitis to lower extremities multiple times, colon polyps, diabetic peripheral neuropathy, type 2 diabetes, ESRD on hemodialysis, history of fall, hyperlipidemia, hypertension, urolithiasis, prostate cancer, peripheral vascular disease, renal cell carcinoma. He presented with falls and diarrhea. Admitted with concern for HCAP and treatment started. C. Difficile negative with blood cultures no growth to date.  Hospital Course:  Healthcare associated pneumonia -Presented with H CAP, chest x-ray however was clear. -Repeat chest x-ray on 12/17 showed infiltrates in the left base, developing pneumonia or aspiration. -Patient was placed on IV vancomycin and cefepime, MRSA PCR was negative, Vanc was discontinued. Transition to oral augmentin for 5days at discharge.  Continue Mucinex, incentive spirometry.  Recommended repeat chest x-ray in 2 to 3 weeks to ensure complete resolution of pneumonia  Generalized debility -Per family, patient ambulates with a walker at baseline, balance difficulty secondary to transmetatarsal amputation -PT OT evaluation recommended home health PT OT   Sinus pauses with bigeminy -3.09sec pause on telemetry on 12/18.  Patient has  been followed closely by cardiology, last seen on 02/15/2018 by Dr. Percival Spanish -Patient was noted to have bradycardia, per Dr. Rosezella [place] notes, patient had Holter monitor and was noted to have a heart rate of 29 especially with episodic 2 is to 1 block, sinus bradycardia -However because of severe comorbid conditions including ESRD, recommended no change in therapy, high risk for pacing.  -Avoid AV nodal blocking drugs, keep potassium close to 4, magnesium above 2  ESRD -On hemodialysis MWF, nephrology consulted and patient received hemodialysis per his schedule.  Hypotension Secondary to HD, continue midodrine  Anemia of chronic disease/renal disease H&H currently stable  History of CAD Currently no chest pain, continue Lipitor, aspirin  Blindness Chronic per chart review.  Diabetes mellitus -Type II, continue sliding scale insulin CBGs controlled  Pancytopenia Has chronic anemia with leukopenia and thrombocytopenia -Follow CBC    Pressure injury of skin Coccygeal and perineal skin ulceration stage III Continue wound care   Day of Discharge S: Tired of being in the hospital, wants to go home seen during HD.  Denies any specific complaints.  BP (!) 128/59 (BP Location: Right Arm)   Pulse 60   Temp 97.7 F (36.5 C) (Oral)   Resp (!) 21   Ht 6\' 4"  (1.93 m)   Wt 90 kg   SpO2 95%   BMI 24.15 kg/m   Physical Exam: General: Alert and awake oriented x3 not in any acute distress. HEENT: anicteric sclera, pupils reactive to light and accommodation CVS: S1-S2 clear no murmur rubs or gallops Chest: Chest much clearer, decreased breath sound at the bases but no wheezing. Abdomen: soft nontender, nondistended, normal bowel sounds Extremities: no cyanosis, clubbing or edema noted bilaterally Neuro: No new deficits   The results of significant diagnostics from this hospitalization (including imaging, microbiology, ancillary and laboratory) are listed below for  reference.      Procedures/Studies:  Dg Chest 2 View  Result Date: 03/20/2018 CLINICAL DATA:  Acute respiratory failure.  Hypoxia. EXAM: CHEST - 2 VIEW COMPARISON:  March 16, 2018 FINDINGS: The heart size borderline. The hila and mediastinum are unremarkable. Mild opacity identified in the left base. No other acute abnormalities or changes. IMPRESSION: Mild infiltrate in the left base suspicious for developing pneumonia or aspiration. Recommend clinical correlation and follow-up to resolution. Electronically Signed   By: Dorise Bullion III M.D   On: 03/20/2018 17:22   Dg Chest Port 1 View  Result Date: 03/16/2018 CLINICAL DATA:  Multiple falls EXAM: PORTABLE CHEST 1 VIEW COMPARISON:  Portable exam 0803 hours compared to 04/18/2017 FINDINGS: Upper normal heart size post CABG. Mediastinal contours and pulmonary vascularity normal. RIGHT basilar atelectasis. Mild chronic elevation of RIGHT diaphragm. Remaining lungs clear. No acute infiltrate, pleural effusion or pneumothorax. Bones demineralized. IMPRESSION: RIGHT basilar atelectasis. Electronically Signed   By: Lavonia Dana M.D.   On: 03/16/2018 08:24      LAB RESULTS: Basic Metabolic Panel: Recent Labs  Lab 03/21/18 0625 03/22/18 1829  NA 134* 139  K 4.0 4.1  CL 96* 97*  CO2 25 29  GLUCOSE 92 112*  BUN 53* 40*  CREATININE 6.11* 5.01*  CALCIUM 8.0* 8.4*  MG 2.1 2.2  PHOS 2.9  --    Liver Function Tests: Recent Labs  Lab 03/17/18 0422 03/19/18 0720 03/21/18 0625  AST 21  --   --   ALT 23  --   --   ALKPHOS 96  --   --   BILITOT 0.9  --   --   PROT 5.7*  --   --   ALBUMIN 3.1* 2.8* 2.6*   No results for input(s): LIPASE, AMYLASE in the last 168 hours. No results for input(s): AMMONIA in the last 168 hours. CBC: Recent Labs  Lab 03/22/18 0506 03/23/18 0448  WBC 2.2* 3.3*  NEUTROABS 1.5* 2.1  HGB 9.0* 9.1*  HCT 28.2* 27.9*  MCV 103.3* 104.5*  PLT 69* 81*   Cardiac Enzymes: No results for input(s):  CKTOTAL, CKMB, CKMBINDEX, TROPONINI in the last 168 hours. BNP: Invalid input(s): POCBNP CBG: Recent Labs  Lab 03/23/18 0640 03/23/18 1036  GLUCAP 82 85      Disposition and Follow-up: Discharge Instructions    Diet Carb Modified   Complete by:  As directed    Increase activity slowly   Complete by:  As directed        DISPOSITION: Home with home health   [place]    [name], MD. Schedule an appointment as soon as possible for a visit in 2 week(s).   Specialty:  Internal Medicine Contact information: [address] [place] [address] [phone]            Time coordinating discharge:  35-minutes  Signed:   Estill Cotta M.D. [facility] 03/23/2018, 2:35 PM Pager: [phone]

## 2018-03-23 NOTE — Care Management Note (Signed)
Case Management Note  Patient Details  Name: [name] MRN: [id number] Date of Birth: 1950/06/10  Subjective/Objective:                    Action/Plan: Pt discharging home with [facility] First program. Tommi Rumps with [facility] aware of d/c today.  Son is at the bedside and will provide transportation home.   Expected Discharge Date:  03/23/18               Expected Discharge Plan:  [place]  In-House Referral:     Discharge planning Services  CM Consult  Post Acute Care Choice:  Home Health Choice offered to:  Spouse  DME Arranged:    DME Agency:     HH Arranged:  RN, PT, OT, Nurse's Aide [place] Agency:  [facility](home first)  Status of Service:  Completed, signed off  If discussed at [place] of Stay Meetings, dates discussed:    Additional Comments:  Pollie Friar, RN 03/23/2018, 1:10 PM

## 2018-03-23 NOTE — Progress Notes (Signed)
[facility] Progress Note  Subjective: seen on HD  Vitals:   03/22/18 2000 03/23/18 0000 03/23/18 0400 03/23/18 0745  BP: 128/64 133/72 (!) 102/58 (!) 131/55  Pulse: (!) 58 (!) 56 (!) 56 (!) 55  Resp: 18 20 12 17   Temp: 98.6 F (37 C) 98.2 F (36.8 C)  97.9 F (36.6 C)  TempSrc: Axillary Axillary  Oral  SpO2: 98% 94% 94% 94%  Weight:      Height:        Inpatient medications: . aspirin EC  81 mg Oral QHS  . Chlorhexidine Gluconate Cloth  6 each Topical Q0600  . darbepoetin (ARANESP) injection - NON-DIALYSIS  60 mcg Subcutaneous Q Wed-1800  . guaiFENesin  1,200 mg Oral BID  . insulin aspart  0-9 Units Subcutaneous TID WC  . midodrine  10 mg Oral TID WC  . multivitamin  1 tablet Oral Daily  . pantoprazole  40 mg Oral BID AC  . sevelamer carbonate  4,800 mg Oral TID WC   . ceFEPime (MAXIPIME) IV Stopped (03/21/18 2127)   acetaminophen **OR** acetaminophen, ondansetron **OR** ondansetron (ZOFRAN) IV, sevelamer carbonate  Iron/TIBC/Ferritin/ %Sat    Component Value Date/Time   IRON 26 (L) 04/12/2016 0657   TIBC 200 (L) 04/12/2016 0657   FERRITIN 1,350 (H) 04/12/2016 0657   IRONPCTSAT 13 (L) 04/12/2016 6387    Exam: Gen alert, frail older adult male not in distress, chronically ill, alert No jvd or bruits Chest coarse rhonchi / congestion bilat, no ^wob RRR no MRG +sternotomy scar Abd soft ntnd no mass or ascites +bs Ext R TMA, no LE edema, no wounds or ulcers Neuro awakens easily , gen'd weakness and deconditioning chronic L AVF+bruit   Home meds:  - aspirin 81/ atorvastatin 10/ MVI/ sevelamer carb ac tid  - midodrine 10 tid   Dialysis: MWF [facility]  4h  450/800   90kg  No heparin  LUE AVF  - vit D 2.5 ug tiw  -parsabiv 15 tiw IV  - midodrine 10 tid  - last Hb 11.6   Impression/ Plan:  PNA - finally starting to look better now, feeling better. Fevers gone.   ESRD on HD: usual HD MWF. HD Today  Volume - no edema on CXR or exam. At dry  wt. Stable vol.   PAD sp TMA  H/o renal cell cancer  MBD ckd - cont meds  Anemia ckd - not on esa at clinic, Hb 9's here. Gave 60 ug darbe on Wed  Chron hypotension - on midodrine tid  Blindness  Chronic debility - was at home prior to admission  Dispo - close to baseline now, wants to go home, has good support there   Kelly Splinter MD [place] pager [phone]   03/23/2018, 11:37 AM   Recent Labs  Lab 03/19/18 0720 03/21/18 0625 03/22/18 1829  NA 137 134* 139  K 4.5 4.0 4.1  CL 98 96* 97*  CO2 23 25 29   GLUCOSE 89 92 112*  BUN 70* 53* 40*  CREATININE 6.95* 6.11* 5.01*  CALCIUM 8.4* 8.0* 8.4*  PHOS 4.4 2.9  --   ALBUMIN 2.8* 2.6*  --    Recent Labs  Lab 03/17/18 0422  AST 21  ALT 23  ALKPHOS 96  BILITOT 0.9  PROT 5.7*   Recent Labs  Lab 03/22/18 0506 03/23/18 0448  WBC 2.2* 3.3*  NEUTROABS 1.5* 2.1  HGB 9.0* 9.1*  HCT 28.2* 27.9*  MCV 103.3* 104.5*  PLT 69* 81*

## 2018-03-24 DIAGNOSIS — J189 Pneumonia, unspecified organism: Secondary | ICD-10-CM | POA: Diagnosis not present

## 2018-03-24 DIAGNOSIS — I4891 Unspecified atrial fibrillation: Secondary | ICD-10-CM | POA: Diagnosis not present

## 2018-03-24 DIAGNOSIS — N186 End stage renal disease: Secondary | ICD-10-CM | POA: Diagnosis not present

## 2018-03-24 DIAGNOSIS — H539 Unspecified visual disturbance: Secondary | ICD-10-CM | POA: Diagnosis not present

## 2018-03-24 DIAGNOSIS — H543 Unqualified visual loss, both eyes: Secondary | ICD-10-CM | POA: Diagnosis not present

## 2018-03-24 DIAGNOSIS — I959 Hypotension, unspecified: Secondary | ICD-10-CM | POA: Diagnosis not present

## 2018-03-24 DIAGNOSIS — J9601 Acute respiratory failure with hypoxia: Secondary | ICD-10-CM | POA: Diagnosis not present

## 2018-03-24 DIAGNOSIS — N209 Urinary calculus, unspecified: Secondary | ICD-10-CM | POA: Diagnosis not present

## 2018-03-24 DIAGNOSIS — E1122 Type 2 diabetes mellitus with diabetic chronic kidney disease: Secondary | ICD-10-CM | POA: Diagnosis not present

## 2018-03-24 DIAGNOSIS — E782 Mixed hyperlipidemia: Secondary | ICD-10-CM | POA: Diagnosis not present

## 2018-03-24 DIAGNOSIS — J90 Pleural effusion, not elsewhere classified: Secondary | ICD-10-CM | POA: Diagnosis not present

## 2018-03-24 DIAGNOSIS — J9811 Atelectasis: Secondary | ICD-10-CM | POA: Diagnosis not present

## 2018-03-24 DIAGNOSIS — I2510 Atherosclerotic heart disease of native coronary artery without angina pectoris: Secondary | ICD-10-CM | POA: Diagnosis not present

## 2018-03-24 DIAGNOSIS — D696 Thrombocytopenia, unspecified: Secondary | ICD-10-CM | POA: Diagnosis not present

## 2018-03-24 DIAGNOSIS — E1151 Type 2 diabetes mellitus with diabetic peripheral angiopathy without gangrene: Secondary | ICD-10-CM | POA: Diagnosis not present

## 2018-03-24 DIAGNOSIS — M47819 Spondylosis without myelopathy or radiculopathy, site unspecified: Secondary | ICD-10-CM | POA: Diagnosis not present

## 2018-03-24 DIAGNOSIS — D61818 Other pancytopenia: Secondary | ICD-10-CM | POA: Diagnosis not present

## 2018-03-24 DIAGNOSIS — E669 Obesity, unspecified: Secondary | ICD-10-CM | POA: Diagnosis not present

## 2018-03-24 DIAGNOSIS — L89153 Pressure ulcer of sacral region, stage 3: Secondary | ICD-10-CM | POA: Diagnosis not present

## 2018-03-24 DIAGNOSIS — I1311 Hypertensive heart and chronic kidney disease without heart failure, with stage 5 chronic kidney disease, or end stage renal disease: Secondary | ICD-10-CM | POA: Diagnosis not present

## 2018-03-24 DIAGNOSIS — D631 Anemia in chronic kidney disease: Secondary | ICD-10-CM | POA: Diagnosis not present

## 2018-03-24 DIAGNOSIS — R001 Bradycardia, unspecified: Secondary | ICD-10-CM | POA: Diagnosis not present

## 2018-03-24 DIAGNOSIS — D72819 Decreased white blood cell count, unspecified: Secondary | ICD-10-CM | POA: Diagnosis not present

## 2018-03-24 DIAGNOSIS — I455 Other specified heart block: Secondary | ICD-10-CM | POA: Diagnosis not present

## 2018-03-24 DIAGNOSIS — E1142 Type 2 diabetes mellitus with diabetic polyneuropathy: Secondary | ICD-10-CM | POA: Diagnosis not present

## 2018-03-25 DIAGNOSIS — D509 Iron deficiency anemia, unspecified: Secondary | ICD-10-CM | POA: Diagnosis not present

## 2018-03-25 DIAGNOSIS — N186 End stage renal disease: Secondary | ICD-10-CM | POA: Diagnosis not present

## 2018-03-25 DIAGNOSIS — K7689 Other specified diseases of liver: Secondary | ICD-10-CM | POA: Diagnosis not present

## 2018-03-25 DIAGNOSIS — E119 Type 2 diabetes mellitus without complications: Secondary | ICD-10-CM | POA: Diagnosis not present

## 2018-03-25 DIAGNOSIS — N2581 Secondary hyperparathyroidism of renal origin: Secondary | ICD-10-CM | POA: Diagnosis not present

## 2018-03-25 DIAGNOSIS — D631 Anemia in chronic kidney disease: Secondary | ICD-10-CM | POA: Diagnosis not present

## 2018-03-26 DIAGNOSIS — I1311 Hypertensive heart and chronic kidney disease without heart failure, with stage 5 chronic kidney disease, or end stage renal disease: Secondary | ICD-10-CM | POA: Diagnosis not present

## 2018-03-26 DIAGNOSIS — L89153 Pressure ulcer of sacral region, stage 3: Secondary | ICD-10-CM | POA: Diagnosis not present

## 2018-03-26 DIAGNOSIS — E1122 Type 2 diabetes mellitus with diabetic chronic kidney disease: Secondary | ICD-10-CM | POA: Diagnosis not present

## 2018-03-26 DIAGNOSIS — D631 Anemia in chronic kidney disease: Secondary | ICD-10-CM | POA: Diagnosis not present

## 2018-03-26 DIAGNOSIS — J189 Pneumonia, unspecified organism: Secondary | ICD-10-CM | POA: Diagnosis not present

## 2018-03-26 DIAGNOSIS — N186 End stage renal disease: Secondary | ICD-10-CM | POA: Diagnosis not present

## 2018-03-27 DIAGNOSIS — E119 Type 2 diabetes mellitus without complications: Secondary | ICD-10-CM | POA: Diagnosis not present

## 2018-03-27 DIAGNOSIS — D509 Iron deficiency anemia, unspecified: Secondary | ICD-10-CM | POA: Diagnosis not present

## 2018-03-27 DIAGNOSIS — N2581 Secondary hyperparathyroidism of renal origin: Secondary | ICD-10-CM | POA: Diagnosis not present

## 2018-03-27 DIAGNOSIS — K7689 Other specified diseases of liver: Secondary | ICD-10-CM | POA: Diagnosis not present

## 2018-03-27 DIAGNOSIS — N186 End stage renal disease: Secondary | ICD-10-CM | POA: Diagnosis not present

## 2018-03-27 DIAGNOSIS — D631 Anemia in chronic kidney disease: Secondary | ICD-10-CM | POA: Diagnosis not present

## 2018-03-29 DIAGNOSIS — D649 Anemia, unspecified: Secondary | ICD-10-CM | POA: Diagnosis not present

## 2018-03-29 DIAGNOSIS — Z6825 Body mass index (BMI) 25.0-25.9, adult: Secondary | ICD-10-CM | POA: Diagnosis not present

## 2018-03-29 DIAGNOSIS — E1129 Type 2 diabetes mellitus with other diabetic kidney complication: Secondary | ICD-10-CM | POA: Diagnosis not present

## 2018-03-29 DIAGNOSIS — L89153 Pressure ulcer of sacral region, stage 3: Secondary | ICD-10-CM | POA: Diagnosis not present

## 2018-03-29 DIAGNOSIS — L8943 Pressure ulcer of contiguous site of back, buttock and hip, stage 3: Secondary | ICD-10-CM | POA: Diagnosis not present

## 2018-03-29 DIAGNOSIS — I1311 Hypertensive heart and chronic kidney disease without heart failure, with stage 5 chronic kidney disease, or end stage renal disease: Secondary | ICD-10-CM | POA: Diagnosis not present

## 2018-03-29 DIAGNOSIS — E1122 Type 2 diabetes mellitus with diabetic chronic kidney disease: Secondary | ICD-10-CM | POA: Diagnosis not present

## 2018-03-29 DIAGNOSIS — J189 Pneumonia, unspecified organism: Secondary | ICD-10-CM | POA: Diagnosis not present

## 2018-03-29 DIAGNOSIS — R001 Bradycardia, unspecified: Secondary | ICD-10-CM | POA: Diagnosis not present

## 2018-03-29 DIAGNOSIS — N186 End stage renal disease: Secondary | ICD-10-CM | POA: Diagnosis not present

## 2018-03-29 DIAGNOSIS — D631 Anemia in chronic kidney disease: Secondary | ICD-10-CM | POA: Diagnosis not present

## 2018-03-29 DIAGNOSIS — I25810 Atherosclerosis of coronary artery bypass graft(s) without angina pectoris: Secondary | ICD-10-CM | POA: Diagnosis not present

## 2018-03-30 DIAGNOSIS — E119 Type 2 diabetes mellitus without complications: Secondary | ICD-10-CM | POA: Diagnosis not present

## 2018-03-30 DIAGNOSIS — D631 Anemia in chronic kidney disease: Secondary | ICD-10-CM | POA: Diagnosis not present

## 2018-03-30 DIAGNOSIS — N2581 Secondary hyperparathyroidism of renal origin: Secondary | ICD-10-CM | POA: Diagnosis not present

## 2018-03-30 DIAGNOSIS — N186 End stage renal disease: Secondary | ICD-10-CM | POA: Diagnosis not present

## 2018-03-30 DIAGNOSIS — D509 Iron deficiency anemia, unspecified: Secondary | ICD-10-CM | POA: Diagnosis not present

## 2018-03-30 DIAGNOSIS — K7689 Other specified diseases of liver: Secondary | ICD-10-CM | POA: Diagnosis not present

## 2018-04-01 DIAGNOSIS — E119 Type 2 diabetes mellitus without complications: Secondary | ICD-10-CM | POA: Diagnosis not present

## 2018-04-01 DIAGNOSIS — K7689 Other specified diseases of liver: Secondary | ICD-10-CM | POA: Diagnosis not present

## 2018-04-01 DIAGNOSIS — N2581 Secondary hyperparathyroidism of renal origin: Secondary | ICD-10-CM | POA: Diagnosis not present

## 2018-04-01 DIAGNOSIS — N186 End stage renal disease: Secondary | ICD-10-CM | POA: Diagnosis not present

## 2018-04-01 DIAGNOSIS — D509 Iron deficiency anemia, unspecified: Secondary | ICD-10-CM | POA: Diagnosis not present

## 2018-04-01 DIAGNOSIS — D631 Anemia in chronic kidney disease: Secondary | ICD-10-CM | POA: Diagnosis not present

## 2018-04-02 DIAGNOSIS — N186 End stage renal disease: Secondary | ICD-10-CM | POA: Diagnosis not present

## 2018-04-02 DIAGNOSIS — D631 Anemia in chronic kidney disease: Secondary | ICD-10-CM | POA: Diagnosis not present

## 2018-04-02 DIAGNOSIS — L89153 Pressure ulcer of sacral region, stage 3: Secondary | ICD-10-CM | POA: Diagnosis not present

## 2018-04-02 DIAGNOSIS — E1122 Type 2 diabetes mellitus with diabetic chronic kidney disease: Secondary | ICD-10-CM | POA: Diagnosis not present

## 2018-04-02 DIAGNOSIS — I1311 Hypertensive heart and chronic kidney disease without heart failure, with stage 5 chronic kidney disease, or end stage renal disease: Secondary | ICD-10-CM | POA: Diagnosis not present

## 2018-04-02 DIAGNOSIS — J189 Pneumonia, unspecified organism: Secondary | ICD-10-CM | POA: Diagnosis not present

## 2018-04-03 DIAGNOSIS — N186 End stage renal disease: Secondary | ICD-10-CM | POA: Diagnosis not present

## 2018-04-03 DIAGNOSIS — K7689 Other specified diseases of liver: Secondary | ICD-10-CM | POA: Diagnosis not present

## 2018-04-03 DIAGNOSIS — D631 Anemia in chronic kidney disease: Secondary | ICD-10-CM | POA: Diagnosis not present

## 2018-04-03 DIAGNOSIS — D509 Iron deficiency anemia, unspecified: Secondary | ICD-10-CM | POA: Diagnosis not present

## 2018-04-03 DIAGNOSIS — E119 Type 2 diabetes mellitus without complications: Secondary | ICD-10-CM | POA: Diagnosis not present

## 2018-04-03 DIAGNOSIS — N2581 Secondary hyperparathyroidism of renal origin: Secondary | ICD-10-CM | POA: Diagnosis not present

## 2018-04-04 DIAGNOSIS — Z992 Dependence on renal dialysis: Secondary | ICD-10-CM | POA: Diagnosis not present

## 2018-04-04 DIAGNOSIS — E1129 Type 2 diabetes mellitus with other diabetic kidney complication: Secondary | ICD-10-CM | POA: Diagnosis not present

## 2018-04-04 DIAGNOSIS — N186 End stage renal disease: Secondary | ICD-10-CM | POA: Diagnosis not present

## 2018-04-06 DIAGNOSIS — D631 Anemia in chronic kidney disease: Secondary | ICD-10-CM | POA: Diagnosis not present

## 2018-04-06 DIAGNOSIS — E119 Type 2 diabetes mellitus without complications: Secondary | ICD-10-CM | POA: Diagnosis not present

## 2018-04-06 DIAGNOSIS — N186 End stage renal disease: Secondary | ICD-10-CM | POA: Diagnosis not present

## 2018-04-06 DIAGNOSIS — D509 Iron deficiency anemia, unspecified: Secondary | ICD-10-CM | POA: Diagnosis not present

## 2018-04-06 DIAGNOSIS — N2581 Secondary hyperparathyroidism of renal origin: Secondary | ICD-10-CM | POA: Diagnosis not present

## 2018-04-06 DIAGNOSIS — K7689 Other specified diseases of liver: Secondary | ICD-10-CM | POA: Diagnosis not present

## 2018-04-09 DIAGNOSIS — L89153 Pressure ulcer of sacral region, stage 3: Secondary | ICD-10-CM | POA: Diagnosis not present

## 2018-04-09 DIAGNOSIS — I1311 Hypertensive heart and chronic kidney disease without heart failure, with stage 5 chronic kidney disease, or end stage renal disease: Secondary | ICD-10-CM | POA: Diagnosis not present

## 2018-04-09 DIAGNOSIS — D631 Anemia in chronic kidney disease: Secondary | ICD-10-CM | POA: Diagnosis not present

## 2018-04-09 DIAGNOSIS — E1122 Type 2 diabetes mellitus with diabetic chronic kidney disease: Secondary | ICD-10-CM | POA: Diagnosis not present

## 2018-04-09 DIAGNOSIS — E119 Type 2 diabetes mellitus without complications: Secondary | ICD-10-CM | POA: Diagnosis not present

## 2018-04-09 DIAGNOSIS — N186 End stage renal disease: Secondary | ICD-10-CM | POA: Diagnosis not present

## 2018-04-09 DIAGNOSIS — D509 Iron deficiency anemia, unspecified: Secondary | ICD-10-CM | POA: Diagnosis not present

## 2018-04-09 DIAGNOSIS — K7689 Other specified diseases of liver: Secondary | ICD-10-CM | POA: Diagnosis not present

## 2018-04-09 DIAGNOSIS — N2581 Secondary hyperparathyroidism of renal origin: Secondary | ICD-10-CM | POA: Diagnosis not present

## 2018-04-09 DIAGNOSIS — J189 Pneumonia, unspecified organism: Secondary | ICD-10-CM | POA: Diagnosis not present

## 2018-04-10 DIAGNOSIS — J189 Pneumonia, unspecified organism: Secondary | ICD-10-CM | POA: Diagnosis not present

## 2018-04-10 DIAGNOSIS — N186 End stage renal disease: Secondary | ICD-10-CM | POA: Diagnosis not present

## 2018-04-10 DIAGNOSIS — L89153 Pressure ulcer of sacral region, stage 3: Secondary | ICD-10-CM | POA: Diagnosis not present

## 2018-04-10 DIAGNOSIS — I1311 Hypertensive heart and chronic kidney disease without heart failure, with stage 5 chronic kidney disease, or end stage renal disease: Secondary | ICD-10-CM | POA: Diagnosis not present

## 2018-04-10 DIAGNOSIS — E1122 Type 2 diabetes mellitus with diabetic chronic kidney disease: Secondary | ICD-10-CM | POA: Diagnosis not present

## 2018-04-10 DIAGNOSIS — D631 Anemia in chronic kidney disease: Secondary | ICD-10-CM | POA: Diagnosis not present

## 2018-04-11 ENCOUNTER — Ambulatory Visit: Payer: Medicare Other

## 2018-04-11 ENCOUNTER — Ambulatory Visit
Admission: RE | Admit: 2018-04-11 | Discharge: 2018-04-11 | Disposition: A | Discharge status: Home / Self Care | Payer: Medicare Other | Source: Ambulatory Visit | Attending: Radiation Oncology | Admitting: Radiation Oncology

## 2018-04-11 DIAGNOSIS — E1129 Type 2 diabetes mellitus with other diabetic kidney complication: Secondary | ICD-10-CM | POA: Diagnosis not present

## 2018-04-11 DIAGNOSIS — D509 Iron deficiency anemia, unspecified: Secondary | ICD-10-CM | POA: Diagnosis not present

## 2018-04-11 DIAGNOSIS — K7689 Other specified diseases of liver: Secondary | ICD-10-CM | POA: Diagnosis not present

## 2018-04-11 DIAGNOSIS — N186 End stage renal disease: Secondary | ICD-10-CM | POA: Diagnosis not present

## 2018-04-11 DIAGNOSIS — D631 Anemia in chronic kidney disease: Secondary | ICD-10-CM | POA: Diagnosis not present

## 2018-04-11 DIAGNOSIS — C641 Malignant neoplasm of right kidney, except renal pelvis: Secondary | ICD-10-CM

## 2018-04-11 DIAGNOSIS — E119 Type 2 diabetes mellitus without complications: Secondary | ICD-10-CM | POA: Diagnosis not present

## 2018-04-11 DIAGNOSIS — N2581 Secondary hyperparathyroidism of renal origin: Secondary | ICD-10-CM | POA: Diagnosis not present

## 2018-04-12 ENCOUNTER — Ambulatory Visit: Payer: Medicare Other | Admitting: Radiation Oncology

## 2018-04-12 ENCOUNTER — Ambulatory Visit: Payer: Medicare Other | Attending: Radiation Oncology

## 2018-04-12 DIAGNOSIS — J189 Pneumonia, unspecified organism: Secondary | ICD-10-CM | POA: Diagnosis not present

## 2018-04-12 DIAGNOSIS — N186 End stage renal disease: Secondary | ICD-10-CM | POA: Diagnosis not present

## 2018-04-12 DIAGNOSIS — E1122 Type 2 diabetes mellitus with diabetic chronic kidney disease: Secondary | ICD-10-CM | POA: Diagnosis not present

## 2018-04-12 DIAGNOSIS — L89153 Pressure ulcer of sacral region, stage 3: Secondary | ICD-10-CM | POA: Diagnosis not present

## 2018-04-12 DIAGNOSIS — D631 Anemia in chronic kidney disease: Secondary | ICD-10-CM | POA: Diagnosis not present

## 2018-04-12 DIAGNOSIS — I1311 Hypertensive heart and chronic kidney disease without heart failure, with stage 5 chronic kidney disease, or end stage renal disease: Secondary | ICD-10-CM | POA: Diagnosis not present

## 2018-04-13 DIAGNOSIS — E119 Type 2 diabetes mellitus without complications: Secondary | ICD-10-CM | POA: Diagnosis not present

## 2018-04-13 DIAGNOSIS — K7689 Other specified diseases of liver: Secondary | ICD-10-CM | POA: Diagnosis not present

## 2018-04-13 DIAGNOSIS — D509 Iron deficiency anemia, unspecified: Secondary | ICD-10-CM | POA: Diagnosis not present

## 2018-04-13 DIAGNOSIS — N2581 Secondary hyperparathyroidism of renal origin: Secondary | ICD-10-CM | POA: Diagnosis not present

## 2018-04-13 DIAGNOSIS — D631 Anemia in chronic kidney disease: Secondary | ICD-10-CM | POA: Diagnosis not present

## 2018-04-13 DIAGNOSIS — N186 End stage renal disease: Secondary | ICD-10-CM | POA: Diagnosis not present

## 2018-04-15 NOTE — Progress Notes (Deleted)
Cardiology Office Note   Date:  04/15/2018   ID:  ARIS EVEN, DOB [birth year], MRN [id number]  PCP:  [name], MD  Cardiologist:   [name], MD   No chief complaint on file.     History of Present Illness: [name] is a 68 y.o. male who presents for the patient has a history of coronary artery disease with diffuse diabetic disease status post bypass.  He had a Myoview in 2013 which was abnormal. Cardiac cath demonstrated significant disease and he underwent CABG with a LIMA to the LAD and SVG to OM. He had right nephrectomy in October 2016 for   He again did not get the transplant and got his kidney removed.  He subsequently had resection of a retroperitoneal mass that was found to be clear cell.   Echo in Jan 2018 during that hospitalization was normal EF with poor windows.  I last saw him because he had bradycardia.  He wore a monitor and was noted to have HR of 29 episodically with episodic 2:1 block and sinus brady.  However, because of his severe comorbid conditions including his ESRD it was suggested that he was high risk for pacing with infection in particular.    He was in the hospital recently with pneumonia.  He did have an echo without significant abnormalities.  ***    He is now going to have a chest wall resection for a mass thought to be renal cell cancer metastasis.    He returns for follow-up with his wife to discuss possible surgical risk.  They are not sure through with this.  There is going to be multispecialty conference to discuss risk benefits.  He remains very active.  He walked in here with his walker but he moves slowly.  He tolerates dialysis.  He is not describing any new shortness of breath, PND or orthopnea.  He is not describing any new palpitations, presyncope or syncope.  He does have low blood pressures and takes his Midrin but has not had any presyncope or syncope.  Past Medical History:  Diagnosis Date  . Anemia   . Arthritis    "back, neck" (07/28/2014)  . Blind    both eyes removed   . Bruises easily   . CAD, NATIVE VESSEL 01/08/2008      . Cellulitis late 1980's   "hospitalized; wrapped both legs; several times; no OR for this"  . Colon polyps   . Diabetic neuropathy ([place])   . Dialysis patient ([place])   . DM type 2 (diabetes mellitus, type 2) ([place])    no medications (07/28/2014) diet and excersie controlled not onmeds for 14-15 years   . Dysrhythmia    afib   . ESRD (end stage renal disease) on dialysis [facility])    [name]; Mon, Wed, Fri (07/28/2014)  . Fall    hx of fall and required left hip surgery   . Family history of breast cancer    mother  . Heart murmur    hx of  . History of blood product transfusion   . HYPERLIPIDEMIA-MIXED 01/08/2008  . HYPERTENSION 01/27/2009   BP low , hx of HTN, Currently on meds to raise BP  . Hypotension   . Kidney stones   . Low blood pressure   . OVERWEIGHT/OBESITY 01/08/2008   Lost 205 lbs through diet and exercise.    . Peripheral vascular disease ([place])    vein stripping  . Pneumonia 2000;s X  1  . Prostate cancer ([place])   . Prosthetic eye globe    both eyes  . Renal cell carcinoma 2001 and 2003   "both kidneys"  . Renal insufficiency     Past Surgical History:  Procedure Laterality Date  . AV FISTULA PLACEMENT Left 02/2010   LFA  . AV FISTULA PLACEMENT Left 01/12/2016   Procedure: LEFT BRACHIOCEPHALIC ARTERIOVENOUS (AV) FISTULA CREATION;  Surgeon: Angelia Mould, MD;  Location: [place];  Service: Vascular;  Laterality: Left;  . CHOLECYSTECTOMY OPEN  1992  . COLONOSCOPY  06/14/2011   Procedure: COLONOSCOPY;  Surgeon: [name], MD;  [facility];  Service: Endoscopy;  Laterality: N/A;  . COLONOSCOPY N/A 07/24/2012   Procedure: COLONOSCOPY;  Surgeon: [name], MD;  [facility];  Service: Endoscopy;  Laterality: N/A;  . COLONOSCOPY    . COLONOSCOPY N/A 11/04/2015   Procedure: COLONOSCOPY;  Surgeon: Manus Gunning, MD;  Location: [facility] ENDOSCOPY;  Service: Gastroenterology;  Laterality: N/A;  . COLONOSCOPY WITH PROPOFOL N/A 05/13/2014   Procedure: COLONOSCOPY WITH PROPOFOL;  Surgeon: [name], MD;  [facility];  Service: Endoscopy;  Laterality: N/A;  . CORONARY ARTERY BYPASS GRAFT  09/29/2011   Procedure: CORONARY ARTERY BYPASS GRAFTING (CABG);  Surgeon: [name], MD;  Location: [place];  Service: Open Heart Surgery;  Laterality: N/A;  Coronary Artery Bypass Graft times two utilizing the left internal mammary artery and the right greater saphenous vein harvested endoscopically.  . CYSTOSCOPY WITH RETROGRADE PYELOGRAM, URETEROSCOPY AND STENT PLACEMENT Left 07/28/2014   Procedure: CYSTOSCOPY WITH RETROGRADE PYELOGRAM, URETERAL BALLOON DILITATION, URETEROSCOPY AND LEFT STENT PLACEMENT;  Surgeon: [name], MD;  [facility];  Service: Urology;  Laterality: Left;  . ENUCLEATION  2003; 2006   bilateral; "diabetes; pain"  . FOOT AMPUTATION Right ~ 2002   right;  partial; "infection"  . FRACTURE SURGERY     hip fx  . HIP PINNING,CANNULATED Left 12/31/2013   Procedure: CANNULATED HIP PINNING;  Surgeon: Renette Butters, MD;  Location: [place];  Service: Orthopedics;  Laterality: Left;  Carm, FX Table, [employer]  . HOT HEMOSTASIS  06/14/2011   Procedure: HOT HEMOSTASIS (ARGON PLASMA COAGULATION/BICAP);  Surgeon: [name], MD;  Location[facility];  Service: Endoscopy;  Laterality: N/A;  . INSERTION OF DIALYSIS CATHETER Right 01/12/2016   Procedure: INSERTION OF DIALYSIS CATHETER;  Surgeon: Angelia Mould, MD;  Location: [place];  Service: Vascular;  Laterality: Right;  . INSERTION PROSTATE RADIATION SEED  03/2012  . IR GENERIC HISTORICAL  01/13/2016   IR RADIOLOGIST EVAL & MGMT 01/13/2016 Aletta Edouard, MD [facility] INTERV RAD  . IR RADIOLOGIST EVAL & MGMT  03/02/2017  . IR RADIOLOGIST EVAL & MGMT  05/18/2017  . IR RADIOLOGIST EVAL & MGMT  07/27/2017  . LAPAROTOMY N/A  04/07/2016   Procedure: EXPLORATORY LAPAROTOMY AND RESECTION OF RETROPERITONEAL MASS;  Surgeon: Raynelle Bring, MD;  [facility];  Service: Urology;  Laterality: N/A;  . LEFT HEART CATHETERIZATION WITH CORONARY ANGIOGRAM N/A 09/27/2011   Procedure: LEFT HEART CATHETERIZATION WITH CORONARY ANGIOGRAM;  Surgeon: [name], MD;  Location: [facility] CATH [facility];  Service: Cardiovascular;  Laterality: N/A;  . LIGATION OF ARTERIOVENOUS  FISTULA Left 01/12/2016   Procedure: LIGATION OF LEFT RADIOCEPHALIC ARTERIOVENOUS  FISTULA;  Surgeon: Angelia Mould, MD;  Location: [place];  Service: Vascular;  Laterality: Left;  . LIGATION OF COMPETING BRANCHES OF ARTERIOVENOUS FISTULA Left 07/29/2014   Procedure: LIGATION  OF COMPETING BRANCHES OF LEFT ARM ARTERIOVENOUS FISTULA;  Surgeon: [name], MD;  Location: [place];  Service: Vascular;  Laterality: Left;  . NEPHRECTOMY Right 01/30/2015   done at [place]  . PARTIAL COLECTOMY Right 04/07/2016   Procedure: RIGHT COLECTOMY AND PARTIAL LIVER RESECTION;  Surgeon: Raynelle Bring, MD;  [facility];  Service: Urology;  Laterality: Right;  . RADIOFREQUENCY ABLATION N/A 04/19/2017   Procedure: CT MICROWAVE THERMAL ABLATION;  Surgeon: Aletta Edouard, MD;  [facility];  Service: Anesthesiology;  Laterality: N/A;  . RADIOFREQUENCY ABLATION KIDNEY  2010-2012   "twice; one on each side; for cancer"  . REVISON OF ARTERIOVENOUS FISTULA Left 09/01/2015   Procedure: EXPLORATION LEFT LOWER ARM  RADIOCEPHALIC ARTERIOVENOUS FISTULA;  Surgeon: Conrad [place], MD;  Location: [place];  Service: Vascular;  Laterality: Left;  [name] VARICOSE VEIN SURGERY  mid 1980's    BLE; "knees down; both legs; 2 separate times"     Current Outpatient Medications  Medication Sig Dispense Refill  . aspirin EC 81 MG tablet Take 81 mg by mouth at bedtime.    [name] atorvastatin (LIPITOR) 10 MG tablet Take 10 mg by mouth daily.    [name] dextromethorphan-guaiFENesin (MUCINEX DM) 30-600 MG 12hr tablet  Take 1 tablet by mouth 2 (two) times daily. 30 tablet 1  . Methoxy PEG-Epoetin Beta (MIRCERA) 50 MCG/0.3ML SOSY Inject 225 mcg as directed every 14 (fourteen) days.     . midodrine (PROAMATINE) 10 MG tablet Take 1 tablet (10 mg total) by mouth 3 (three) times daily with meals. 90 tablet 0  . multivitamin (RENA-VIT) TABS tablet Take 1 tablet by mouth daily.    . pantoprazole (PROTONIX) 40 MG tablet Take 1 tablet (40 mg total) by mouth daily before breakfast. 30 tablet 3  . Probiotic Product (ALIGN) 4 MG CAPS Take 1 capsule by mouth daily.    . sevelamer carbonate (RENVELA) 800 MG tablet Take 2,400-4,800 mg by mouth See admin instructions. Take 6 tablets (4800 mg) by mouth 3 times daily with meals and 3 tablets (2400 mg) 2 times daily with snacks     No current facility-administered medications for this visit.     Allergies:   Codeine and Tape     ROS:  Please see the history of present illness.   Otherwise, review of systems are positive for ***.  All other systems are reviewed and negative.    PHYSICAL EXAM: VS:  There were no vitals taken for this visit. , BMI There is no height or weight on file to calculate BMI.  GENERAL:  Frail appearing NECK:  No jugular venous distention, waveform within normal limits, carotid upstroke brisk and symmetric, no bruits, no thyromegaly LUNGS:  Clear to auscultation bilaterally CHEST:  Unremarkable HEART:  PMI not displaced or sustained,S1 and S2 within normal limits, no S3, no S4, no clicks, no rubs, *** murmurs ABD:  Flat, positive bowel sounds normal in frequency in pitch, no bruits, no rebound, no guarding, no midline pulsatile mass, no hepatomegaly, no splenomegaly EXT:  2 plus pulses throughout, no edema, no cyanosis no clubbing    ***GENERAL:  Frail appearing NECK:  No jugular venous distention, waveform within normal limits, carotid upstroke brisk and symmetric, no bruits, no thyromegaly LUNGS:  Clear to auscultation bilaterally CHEST:   Unremarkable HEART:  PMI not displaced or sustained,S1 and S2 within normal limits, no S3, no S4, no clicks, no rubs, no murmurs ABD:  Flat, positive bowel sounds normal in  frequency in pitch, no bruits, no rebound, no guarding, no midline pulsatile mass, no hepatomegaly, no splenomegaly EXT:  2 plus pulses throughout, no edema, no cyanosis no clubbing, status post amputation of his right toes  Recent Labs: 03/17/2018: ALT 23 03/22/2018: BUN 40; Creatinine, Ser 5.01; Magnesium 2.2; Potassium 4.1; Sodium 139 03/23/2018: Hemoglobin 9.1; Platelets 81      Wt Readings from Last 3 Encounters:  02/20/18 202 lb (91.6 kg)  02/15/18 202 lb (91.6 kg)  01/30/18 198 lb (89.8 kg)    EKG: Atrial fibrillation, left axis deviation, left anterior fascicular block, probable junctional escape rhythm, rate 57.  Other studies Reviewed: Additional studies/ records that were reviewed today include:  *** Review of the above records demonstrates:  ***   ASSESSMENT AND PLAN:   Preop The patient does not have prohibitive risk from a standpoint of unstable symptoms or untreated coronary disease.  He had a negative perfusion study in 2017.  He does have bradycardia arrhythmia which could be a complicating factor perioperatively or postoperatively but would need to be managed episodically.  Overall his risk is high because of his debility and chronic comorbidities that are noncardiac.  No further cardiac testing would alter this.  This needs to be factor again in ongoing discussions about the risk benefits.  I had this discussion with the patient and his wife.  Bradycardia ***   He is high risk for a pacemaker.  No change in therapy  Hx of CABG-2013 He had a low risk perfusion study in 2017.  ***  at [facility] had no further work-up.   End stage renal failure on dialysis [facility]) ***   He tolerates dialysis.  Hypotension ***  We talked about appropriate dosing of his midodrine.   Atrial fib  ***   EKG today  looks like atrial fibrillation.  I looked him up change the speed and gain and I do not see any P waves.  I talked with the patient about risk benefits of anticoagulation and should reconsider this in the future once we understand whether he is having surgery and I also asked him to inquire about the risk of bleeding should he be on warfarin.  I will see him back in a few months to make sure we further discuss this.   Current medicines are reviewed at length with the patient today.  The patient does not have concerns regarding medicines.  The following changes have been made:  ***  Labs/ tests ordered today include: ***    No orders of the defined types were placed in this encounter.    Disposition:   FU with me in ***  months.    Signed, [name], MD  04/15/2018 8:09 PM    [place]

## 2018-04-16 DIAGNOSIS — K7689 Other specified diseases of liver: Secondary | ICD-10-CM | POA: Diagnosis not present

## 2018-04-16 DIAGNOSIS — D631 Anemia in chronic kidney disease: Secondary | ICD-10-CM | POA: Diagnosis not present

## 2018-04-16 DIAGNOSIS — E119 Type 2 diabetes mellitus without complications: Secondary | ICD-10-CM | POA: Diagnosis not present

## 2018-04-16 DIAGNOSIS — N2581 Secondary hyperparathyroidism of renal origin: Secondary | ICD-10-CM | POA: Diagnosis not present

## 2018-04-16 DIAGNOSIS — N186 End stage renal disease: Secondary | ICD-10-CM | POA: Diagnosis not present

## 2018-04-16 DIAGNOSIS — D509 Iron deficiency anemia, unspecified: Secondary | ICD-10-CM | POA: Diagnosis not present

## 2018-04-17 ENCOUNTER — Telehealth: Payer: Self-pay | Admitting: *Deleted

## 2018-04-17 ENCOUNTER — Ambulatory Visit: Payer: Medicare Other | Admitting: Cardiology

## 2018-04-17 DIAGNOSIS — E1122 Type 2 diabetes mellitus with diabetic chronic kidney disease: Secondary | ICD-10-CM | POA: Diagnosis not present

## 2018-04-17 DIAGNOSIS — J189 Pneumonia, unspecified organism: Secondary | ICD-10-CM | POA: Diagnosis not present

## 2018-04-17 DIAGNOSIS — I1311 Hypertensive heart and chronic kidney disease without heart failure, with stage 5 chronic kidney disease, or end stage renal disease: Secondary | ICD-10-CM | POA: Diagnosis not present

## 2018-04-17 DIAGNOSIS — D631 Anemia in chronic kidney disease: Secondary | ICD-10-CM | POA: Diagnosis not present

## 2018-04-17 DIAGNOSIS — N186 End stage renal disease: Secondary | ICD-10-CM | POA: Diagnosis not present

## 2018-04-17 DIAGNOSIS — L89153 Pressure ulcer of sacral region, stage 3: Secondary | ICD-10-CM | POA: Diagnosis not present

## 2018-04-17 NOTE — Telephone Encounter (Signed)
I have try to reach out few times to reschedule they have been no shows, the answer machine don't pick up so you cant lvm.

## 2018-04-18 DIAGNOSIS — N2581 Secondary hyperparathyroidism of renal origin: Secondary | ICD-10-CM | POA: Diagnosis not present

## 2018-04-18 DIAGNOSIS — D631 Anemia in chronic kidney disease: Secondary | ICD-10-CM | POA: Diagnosis not present

## 2018-04-18 DIAGNOSIS — D509 Iron deficiency anemia, unspecified: Secondary | ICD-10-CM | POA: Diagnosis not present

## 2018-04-18 DIAGNOSIS — E119 Type 2 diabetes mellitus without complications: Secondary | ICD-10-CM | POA: Diagnosis not present

## 2018-04-18 DIAGNOSIS — N186 End stage renal disease: Secondary | ICD-10-CM | POA: Diagnosis not present

## 2018-04-18 DIAGNOSIS — K7689 Other specified diseases of liver: Secondary | ICD-10-CM | POA: Diagnosis not present

## 2018-04-19 ENCOUNTER — Telehealth: Payer: Self-pay | Admitting: Urology

## 2018-04-19 DIAGNOSIS — I1311 Hypertensive heart and chronic kidney disease without heart failure, with stage 5 chronic kidney disease, or end stage renal disease: Secondary | ICD-10-CM | POA: Diagnosis not present

## 2018-04-19 DIAGNOSIS — L89153 Pressure ulcer of sacral region, stage 3: Secondary | ICD-10-CM | POA: Diagnosis not present

## 2018-04-19 DIAGNOSIS — E1122 Type 2 diabetes mellitus with diabetic chronic kidney disease: Secondary | ICD-10-CM | POA: Diagnosis not present

## 2018-04-19 DIAGNOSIS — D631 Anemia in chronic kidney disease: Secondary | ICD-10-CM | POA: Diagnosis not present

## 2018-04-19 DIAGNOSIS — J189 Pneumonia, unspecified organism: Secondary | ICD-10-CM | POA: Diagnosis not present

## 2018-04-19 DIAGNOSIS — N186 End stage renal disease: Secondary | ICD-10-CM | POA: Diagnosis not present

## 2018-04-19 NOTE — Telephone Encounter (Signed)
I left a message on the mobile number listed in his chart, [phone], requesting a return call to reschedule consult to discuss potential XRT to the enlarging right chest wall mass that was noted on recent CT C/A/P 12/19/17.  This was discussed at recent prostate [place] on 03/16/18 and the recommendation was to proceed with SBRT to the [facility] met. Unfortunately, he was hospitalized with HCAP from 12/13- 12/20.  He was initially scheduled for a consult with Dr. Lisbeth Renshaw on 04/12/18 but cancelled and we have been unable to get back in touch with the patient to get him rescheduled despite multiple attempts. -Annaleigh Steinmeyer

## 2018-04-20 ENCOUNTER — Encounter: Payer: Self-pay | Admitting: Cardiology

## 2018-04-20 DIAGNOSIS — K7689 Other specified diseases of liver: Secondary | ICD-10-CM | POA: Diagnosis not present

## 2018-04-20 DIAGNOSIS — N186 End stage renal disease: Secondary | ICD-10-CM | POA: Diagnosis not present

## 2018-04-20 DIAGNOSIS — D509 Iron deficiency anemia, unspecified: Secondary | ICD-10-CM | POA: Diagnosis not present

## 2018-04-20 DIAGNOSIS — E119 Type 2 diabetes mellitus without complications: Secondary | ICD-10-CM | POA: Diagnosis not present

## 2018-04-20 DIAGNOSIS — D631 Anemia in chronic kidney disease: Secondary | ICD-10-CM | POA: Diagnosis not present

## 2018-04-20 DIAGNOSIS — N2581 Secondary hyperparathyroidism of renal origin: Secondary | ICD-10-CM | POA: Diagnosis not present

## 2018-04-23 DIAGNOSIS — E119 Type 2 diabetes mellitus without complications: Secondary | ICD-10-CM | POA: Diagnosis not present

## 2018-04-23 DIAGNOSIS — N2581 Secondary hyperparathyroidism of renal origin: Secondary | ICD-10-CM | POA: Diagnosis not present

## 2018-04-23 DIAGNOSIS — D509 Iron deficiency anemia, unspecified: Secondary | ICD-10-CM | POA: Diagnosis not present

## 2018-04-23 DIAGNOSIS — D631 Anemia in chronic kidney disease: Secondary | ICD-10-CM | POA: Diagnosis not present

## 2018-04-23 DIAGNOSIS — N186 End stage renal disease: Secondary | ICD-10-CM | POA: Diagnosis not present

## 2018-04-23 DIAGNOSIS — K7689 Other specified diseases of liver: Secondary | ICD-10-CM | POA: Diagnosis not present

## 2018-04-24 DIAGNOSIS — I1311 Hypertensive heart and chronic kidney disease without heart failure, with stage 5 chronic kidney disease, or end stage renal disease: Secondary | ICD-10-CM | POA: Diagnosis not present

## 2018-04-24 DIAGNOSIS — N186 End stage renal disease: Secondary | ICD-10-CM | POA: Diagnosis not present

## 2018-04-24 DIAGNOSIS — E1122 Type 2 diabetes mellitus with diabetic chronic kidney disease: Secondary | ICD-10-CM | POA: Diagnosis not present

## 2018-04-24 DIAGNOSIS — D631 Anemia in chronic kidney disease: Secondary | ICD-10-CM | POA: Diagnosis not present

## 2018-04-24 DIAGNOSIS — L89153 Pressure ulcer of sacral region, stage 3: Secondary | ICD-10-CM | POA: Diagnosis not present

## 2018-04-24 DIAGNOSIS — J189 Pneumonia, unspecified organism: Secondary | ICD-10-CM | POA: Diagnosis not present

## 2018-04-25 DIAGNOSIS — K7689 Other specified diseases of liver: Secondary | ICD-10-CM | POA: Diagnosis not present

## 2018-04-25 DIAGNOSIS — N2581 Secondary hyperparathyroidism of renal origin: Secondary | ICD-10-CM | POA: Diagnosis not present

## 2018-04-25 DIAGNOSIS — N186 End stage renal disease: Secondary | ICD-10-CM | POA: Diagnosis not present

## 2018-04-25 DIAGNOSIS — D631 Anemia in chronic kidney disease: Secondary | ICD-10-CM | POA: Diagnosis not present

## 2018-04-25 DIAGNOSIS — D509 Iron deficiency anemia, unspecified: Secondary | ICD-10-CM | POA: Diagnosis not present

## 2018-04-25 DIAGNOSIS — E119 Type 2 diabetes mellitus without complications: Secondary | ICD-10-CM | POA: Diagnosis not present

## 2018-04-27 DIAGNOSIS — N2581 Secondary hyperparathyroidism of renal origin: Secondary | ICD-10-CM | POA: Diagnosis not present

## 2018-04-27 DIAGNOSIS — K7689 Other specified diseases of liver: Secondary | ICD-10-CM | POA: Diagnosis not present

## 2018-04-27 DIAGNOSIS — D631 Anemia in chronic kidney disease: Secondary | ICD-10-CM | POA: Diagnosis not present

## 2018-04-27 DIAGNOSIS — D509 Iron deficiency anemia, unspecified: Secondary | ICD-10-CM | POA: Diagnosis not present

## 2018-04-27 DIAGNOSIS — E119 Type 2 diabetes mellitus without complications: Secondary | ICD-10-CM | POA: Diagnosis not present

## 2018-04-27 DIAGNOSIS — N186 End stage renal disease: Secondary | ICD-10-CM | POA: Diagnosis not present

## 2018-04-30 DIAGNOSIS — E119 Type 2 diabetes mellitus without complications: Secondary | ICD-10-CM | POA: Diagnosis not present

## 2018-04-30 DIAGNOSIS — K7689 Other specified diseases of liver: Secondary | ICD-10-CM | POA: Diagnosis not present

## 2018-04-30 DIAGNOSIS — N186 End stage renal disease: Secondary | ICD-10-CM | POA: Diagnosis not present

## 2018-04-30 DIAGNOSIS — D631 Anemia in chronic kidney disease: Secondary | ICD-10-CM | POA: Diagnosis not present

## 2018-04-30 DIAGNOSIS — N2581 Secondary hyperparathyroidism of renal origin: Secondary | ICD-10-CM | POA: Diagnosis not present

## 2018-04-30 DIAGNOSIS — D509 Iron deficiency anemia, unspecified: Secondary | ICD-10-CM | POA: Diagnosis not present

## 2018-05-02 ENCOUNTER — Ambulatory Visit: Payer: Medicare Other

## 2018-05-02 ENCOUNTER — Ambulatory Visit: Payer: Medicare Other | Attending: Radiation Oncology | Admitting: Radiation Oncology

## 2018-05-02 ENCOUNTER — Ambulatory Visit
Admission: RE | Admit: 2018-05-02 | Discharge: 2018-05-02 | Disposition: A | Discharge status: Home / Self Care | Payer: Medicare Other | Source: Ambulatory Visit | Attending: Radiation Oncology | Admitting: Radiation Oncology

## 2018-05-02 DIAGNOSIS — N2581 Secondary hyperparathyroidism of renal origin: Secondary | ICD-10-CM | POA: Diagnosis not present

## 2018-05-02 DIAGNOSIS — D509 Iron deficiency anemia, unspecified: Secondary | ICD-10-CM | POA: Diagnosis not present

## 2018-05-02 DIAGNOSIS — E119 Type 2 diabetes mellitus without complications: Secondary | ICD-10-CM | POA: Diagnosis not present

## 2018-05-02 DIAGNOSIS — N186 End stage renal disease: Secondary | ICD-10-CM | POA: Diagnosis not present

## 2018-05-02 DIAGNOSIS — K7689 Other specified diseases of liver: Secondary | ICD-10-CM | POA: Diagnosis not present

## 2018-05-02 DIAGNOSIS — D631 Anemia in chronic kidney disease: Secondary | ICD-10-CM | POA: Diagnosis not present

## 2018-05-04 DIAGNOSIS — D509 Iron deficiency anemia, unspecified: Secondary | ICD-10-CM | POA: Diagnosis not present

## 2018-05-04 DIAGNOSIS — R2681 Unsteadiness on feet: Secondary | ICD-10-CM | POA: Diagnosis not present

## 2018-05-04 DIAGNOSIS — K7689 Other specified diseases of liver: Secondary | ICD-10-CM | POA: Diagnosis not present

## 2018-05-04 DIAGNOSIS — E119 Type 2 diabetes mellitus without complications: Secondary | ICD-10-CM | POA: Diagnosis not present

## 2018-05-04 DIAGNOSIS — M6281 Muscle weakness (generalized): Secondary | ICD-10-CM | POA: Diagnosis not present

## 2018-05-04 DIAGNOSIS — N2581 Secondary hyperparathyroidism of renal origin: Secondary | ICD-10-CM | POA: Diagnosis not present

## 2018-05-04 DIAGNOSIS — D631 Anemia in chronic kidney disease: Secondary | ICD-10-CM | POA: Diagnosis not present

## 2018-05-04 DIAGNOSIS — N186 End stage renal disease: Secondary | ICD-10-CM | POA: Diagnosis not present

## 2018-05-05 DIAGNOSIS — N186 End stage renal disease: Secondary | ICD-10-CM | POA: Diagnosis not present

## 2018-05-05 DIAGNOSIS — E1129 Type 2 diabetes mellitus with other diabetic kidney complication: Secondary | ICD-10-CM | POA: Diagnosis not present

## 2018-05-05 DIAGNOSIS — Z992 Dependence on renal dialysis: Secondary | ICD-10-CM | POA: Diagnosis not present

## 2018-05-07 DIAGNOSIS — K7689 Other specified diseases of liver: Secondary | ICD-10-CM | POA: Diagnosis not present

## 2018-05-07 DIAGNOSIS — E119 Type 2 diabetes mellitus without complications: Secondary | ICD-10-CM | POA: Diagnosis not present

## 2018-05-07 DIAGNOSIS — M6281 Muscle weakness (generalized): Secondary | ICD-10-CM | POA: Diagnosis not present

## 2018-05-07 DIAGNOSIS — D509 Iron deficiency anemia, unspecified: Secondary | ICD-10-CM | POA: Diagnosis not present

## 2018-05-07 DIAGNOSIS — N186 End stage renal disease: Secondary | ICD-10-CM | POA: Diagnosis not present

## 2018-05-07 DIAGNOSIS — R262 Difficulty in walking, not elsewhere classified: Secondary | ICD-10-CM | POA: Diagnosis not present

## 2018-05-07 DIAGNOSIS — D631 Anemia in chronic kidney disease: Secondary | ICD-10-CM | POA: Diagnosis not present

## 2018-05-07 DIAGNOSIS — N2581 Secondary hyperparathyroidism of renal origin: Secondary | ICD-10-CM | POA: Diagnosis not present

## 2018-05-08 ENCOUNTER — Encounter: Payer: Self-pay | Admitting: Podiatry

## 2018-05-08 ENCOUNTER — Ambulatory Visit (INDEPENDENT_AMBULATORY_CARE_PROVIDER_SITE_OTHER): Payer: Medicare Other | Admitting: Podiatry

## 2018-05-08 DIAGNOSIS — B351 Tinea unguium: Secondary | ICD-10-CM

## 2018-05-08 DIAGNOSIS — M6281 Muscle weakness (generalized): Secondary | ICD-10-CM | POA: Diagnosis not present

## 2018-05-08 DIAGNOSIS — R2681 Unsteadiness on feet: Secondary | ICD-10-CM | POA: Diagnosis not present

## 2018-05-08 DIAGNOSIS — M79675 Pain in left toe(s): Secondary | ICD-10-CM

## 2018-05-08 DIAGNOSIS — S98131D Complete traumatic amputation of one right lesser toe, subsequent encounter: Secondary | ICD-10-CM

## 2018-05-08 DIAGNOSIS — I739 Peripheral vascular disease, unspecified: Secondary | ICD-10-CM

## 2018-05-08 DIAGNOSIS — E1159 Type 2 diabetes mellitus with other circulatory complications: Secondary | ICD-10-CM

## 2018-05-08 NOTE — Progress Notes (Signed)
This patient presents to the office today with thick disfigured discolored toenails on his left foot.  Patient has a history of a transmetatarsal amputation right foot.  This patient is also blind.  Patient states the nails on the toes on his left foot are painful walking and wearing his shoes.  He is unable to self treat.  He presents to the office for preventative foot care services.   General Appearance  Alert, conversant and in no acute stress.  Vascular  Dorsalis pedis and posterior pulses are weakly  palpable  bilaterally.  Capillary return is within normal limits  bilaterally. Temperature is within normal limits  Bilaterally.  Neurologic  Senn-Weinstein monofilament wire test deferred.  Nails Thick disfigured discolored nails with subungual debris  from hallux to fifth toes  Left foot.. No evidence of bacterial infection or drainage bilaterally.  Orthopedic  No limitations of motion of motion feet bilaterally.  No crepitus or effusions noted.  No bony pathology or digital deformities noted. TMA right foot.  Skin  normotropic skin with no porokeratosis noted bilaterally.  No signs of infections or ulcers noted.     Onychomycosis  Left foot   ROV  Debridement of long painful nails left foot x 5.  RTC 3 months.     Gardiner Barefoot DPM

## 2018-05-09 DIAGNOSIS — D509 Iron deficiency anemia, unspecified: Secondary | ICD-10-CM | POA: Diagnosis not present

## 2018-05-09 DIAGNOSIS — N186 End stage renal disease: Secondary | ICD-10-CM | POA: Diagnosis not present

## 2018-05-09 DIAGNOSIS — D631 Anemia in chronic kidney disease: Secondary | ICD-10-CM | POA: Diagnosis not present

## 2018-05-09 DIAGNOSIS — N2581 Secondary hyperparathyroidism of renal origin: Secondary | ICD-10-CM | POA: Diagnosis not present

## 2018-05-09 DIAGNOSIS — K7689 Other specified diseases of liver: Secondary | ICD-10-CM | POA: Diagnosis not present

## 2018-05-09 DIAGNOSIS — E119 Type 2 diabetes mellitus without complications: Secondary | ICD-10-CM | POA: Diagnosis not present

## 2018-05-10 ENCOUNTER — Telehealth: Payer: Self-pay | Admitting: *Deleted

## 2018-05-10 ENCOUNTER — Ambulatory Visit: Payer: Medicare Other

## 2018-05-10 ENCOUNTER — Ambulatory Visit: Payer: Medicare Other | Admitting: Radiation Oncology

## 2018-05-10 ENCOUNTER — Ambulatory Visit
Admission: RE | Admit: 2018-05-10 | Discharge: 2018-05-10 | Disposition: A | Discharge status: Home / Self Care | Payer: Medicare Other | Source: Ambulatory Visit | Attending: Radiation Oncology | Admitting: Radiation Oncology

## 2018-05-10 DIAGNOSIS — R262 Difficulty in walking, not elsewhere classified: Secondary | ICD-10-CM | POA: Diagnosis not present

## 2018-05-10 DIAGNOSIS — C649 Malignant neoplasm of unspecified kidney, except renal pelvis: Secondary | ICD-10-CM

## 2018-05-10 DIAGNOSIS — M6281 Muscle weakness (generalized): Secondary | ICD-10-CM | POA: Diagnosis not present

## 2018-05-10 NOTE — Telephone Encounter (Signed)
Called the patient to inquire if he was coming for his appointment.  I did not get an answer and was not able to leave a voicemail.  I will send the chart back to scheduling.  Will continue to follow as necessary.  Gloriajean Dell. Leonie Green, BSN

## 2018-05-11 DIAGNOSIS — E119 Type 2 diabetes mellitus without complications: Secondary | ICD-10-CM | POA: Diagnosis not present

## 2018-05-11 DIAGNOSIS — D509 Iron deficiency anemia, unspecified: Secondary | ICD-10-CM | POA: Diagnosis not present

## 2018-05-11 DIAGNOSIS — D631 Anemia in chronic kidney disease: Secondary | ICD-10-CM | POA: Diagnosis not present

## 2018-05-11 DIAGNOSIS — N2581 Secondary hyperparathyroidism of renal origin: Secondary | ICD-10-CM | POA: Diagnosis not present

## 2018-05-11 DIAGNOSIS — N186 End stage renal disease: Secondary | ICD-10-CM | POA: Diagnosis not present

## 2018-05-11 DIAGNOSIS — K7689 Other specified diseases of liver: Secondary | ICD-10-CM | POA: Diagnosis not present

## 2018-05-14 DIAGNOSIS — N2581 Secondary hyperparathyroidism of renal origin: Secondary | ICD-10-CM | POA: Diagnosis not present

## 2018-05-14 DIAGNOSIS — D509 Iron deficiency anemia, unspecified: Secondary | ICD-10-CM | POA: Diagnosis not present

## 2018-05-14 DIAGNOSIS — N186 End stage renal disease: Secondary | ICD-10-CM | POA: Diagnosis not present

## 2018-05-14 DIAGNOSIS — K7689 Other specified diseases of liver: Secondary | ICD-10-CM | POA: Diagnosis not present

## 2018-05-14 DIAGNOSIS — D631 Anemia in chronic kidney disease: Secondary | ICD-10-CM | POA: Diagnosis not present

## 2018-05-14 DIAGNOSIS — E119 Type 2 diabetes mellitus without complications: Secondary | ICD-10-CM | POA: Diagnosis not present

## 2018-05-16 DIAGNOSIS — D509 Iron deficiency anemia, unspecified: Secondary | ICD-10-CM | POA: Diagnosis not present

## 2018-05-16 DIAGNOSIS — D631 Anemia in chronic kidney disease: Secondary | ICD-10-CM | POA: Diagnosis not present

## 2018-05-16 DIAGNOSIS — E119 Type 2 diabetes mellitus without complications: Secondary | ICD-10-CM | POA: Diagnosis not present

## 2018-05-16 DIAGNOSIS — N186 End stage renal disease: Secondary | ICD-10-CM | POA: Diagnosis not present

## 2018-05-16 DIAGNOSIS — N2581 Secondary hyperparathyroidism of renal origin: Secondary | ICD-10-CM | POA: Diagnosis not present

## 2018-05-16 DIAGNOSIS — K7689 Other specified diseases of liver: Secondary | ICD-10-CM | POA: Diagnosis not present

## 2018-05-18 DIAGNOSIS — D509 Iron deficiency anemia, unspecified: Secondary | ICD-10-CM | POA: Diagnosis not present

## 2018-05-18 DIAGNOSIS — E119 Type 2 diabetes mellitus without complications: Secondary | ICD-10-CM | POA: Diagnosis not present

## 2018-05-18 DIAGNOSIS — N186 End stage renal disease: Secondary | ICD-10-CM | POA: Diagnosis not present

## 2018-05-18 DIAGNOSIS — N2581 Secondary hyperparathyroidism of renal origin: Secondary | ICD-10-CM | POA: Diagnosis not present

## 2018-05-18 DIAGNOSIS — D631 Anemia in chronic kidney disease: Secondary | ICD-10-CM | POA: Diagnosis not present

## 2018-05-18 DIAGNOSIS — K7689 Other specified diseases of liver: Secondary | ICD-10-CM | POA: Diagnosis not present

## 2018-05-21 DIAGNOSIS — N186 End stage renal disease: Secondary | ICD-10-CM | POA: Diagnosis not present

## 2018-05-21 DIAGNOSIS — N2581 Secondary hyperparathyroidism of renal origin: Secondary | ICD-10-CM | POA: Diagnosis not present

## 2018-05-21 DIAGNOSIS — K7689 Other specified diseases of liver: Secondary | ICD-10-CM | POA: Diagnosis not present

## 2018-05-21 DIAGNOSIS — E119 Type 2 diabetes mellitus without complications: Secondary | ICD-10-CM | POA: Diagnosis not present

## 2018-05-21 DIAGNOSIS — D631 Anemia in chronic kidney disease: Secondary | ICD-10-CM | POA: Diagnosis not present

## 2018-05-21 DIAGNOSIS — D509 Iron deficiency anemia, unspecified: Secondary | ICD-10-CM | POA: Diagnosis not present

## 2018-05-23 DIAGNOSIS — D631 Anemia in chronic kidney disease: Secondary | ICD-10-CM | POA: Diagnosis not present

## 2018-05-23 DIAGNOSIS — K7689 Other specified diseases of liver: Secondary | ICD-10-CM | POA: Diagnosis not present

## 2018-05-23 DIAGNOSIS — N2581 Secondary hyperparathyroidism of renal origin: Secondary | ICD-10-CM | POA: Diagnosis not present

## 2018-05-23 DIAGNOSIS — D509 Iron deficiency anemia, unspecified: Secondary | ICD-10-CM | POA: Diagnosis not present

## 2018-05-23 DIAGNOSIS — E119 Type 2 diabetes mellitus without complications: Secondary | ICD-10-CM | POA: Diagnosis not present

## 2018-05-23 DIAGNOSIS — N186 End stage renal disease: Secondary | ICD-10-CM | POA: Diagnosis not present

## 2018-05-25 DIAGNOSIS — N186 End stage renal disease: Secondary | ICD-10-CM | POA: Diagnosis not present

## 2018-05-25 DIAGNOSIS — K7689 Other specified diseases of liver: Secondary | ICD-10-CM | POA: Diagnosis not present

## 2018-05-25 DIAGNOSIS — D509 Iron deficiency anemia, unspecified: Secondary | ICD-10-CM | POA: Diagnosis not present

## 2018-05-25 DIAGNOSIS — E119 Type 2 diabetes mellitus without complications: Secondary | ICD-10-CM | POA: Diagnosis not present

## 2018-05-25 DIAGNOSIS — N2581 Secondary hyperparathyroidism of renal origin: Secondary | ICD-10-CM | POA: Diagnosis not present

## 2018-05-25 DIAGNOSIS — D631 Anemia in chronic kidney disease: Secondary | ICD-10-CM | POA: Diagnosis not present

## 2018-05-28 DIAGNOSIS — N2581 Secondary hyperparathyroidism of renal origin: Secondary | ICD-10-CM | POA: Diagnosis not present

## 2018-05-28 DIAGNOSIS — D509 Iron deficiency anemia, unspecified: Secondary | ICD-10-CM | POA: Diagnosis not present

## 2018-05-28 DIAGNOSIS — D631 Anemia in chronic kidney disease: Secondary | ICD-10-CM | POA: Diagnosis not present

## 2018-05-28 DIAGNOSIS — K7689 Other specified diseases of liver: Secondary | ICD-10-CM | POA: Diagnosis not present

## 2018-05-28 DIAGNOSIS — E119 Type 2 diabetes mellitus without complications: Secondary | ICD-10-CM | POA: Diagnosis not present

## 2018-05-28 DIAGNOSIS — N186 End stage renal disease: Secondary | ICD-10-CM | POA: Diagnosis not present

## 2018-05-28 NOTE — Progress Notes (Signed)
Thoracic Location of Tumor / Histology: Malignant neoplasm of right chest wall  -He recently was found to have progression within a lesion in the right posterior lateral chest wall at the 10th-11th intercostal space which is slowly progressed since April 2018 when it in retrospect measured 10 mm. That was consistent with concerns for metastatic disease and a biopsy on 02/08/2018 confirmed clear cell renal carcinoma. He met with Dr. Roxan Hockey to discuss options of resection and was offered this. Does not appear that he elected to proceed and was sent to see [place] today by Dr. Alinda Money to discuss options of radiotherapy.  Patient presented for routine scan   CT CAP 12/19/2017: 2.1 x 1.5 cm lesion in the right posterolateral chest wall, along the 10th/11th interspace, slowly progressive from 07/14/2016.  PET 07/27/2017:There is a subtle area of soft tissue nodularity within the posterolateral right chest wall within the right 9-10 interspace measuring 1.9 cm,SUV max is equal to 2.7. A second area of soft tissue thickening within the posterolateral right chest wall is identified measuring 2.3 cm and has an SUV max equal to 4.0. This is directly adjacent to the right ninth rib. These are not significantly changed when compared with 02/28/2017 and may be related to prior trauma or surgery.  Biopsies of R Chest wall 02/08/2018 FINDINGS: Soft tissue nodule of the right posterolateral chest wall situated between the tenth and eleventh ribs measures approximately 1.5 x 2.5cm in maximal transverse diameter. Solid tissue was obtained with biopsy. There were no complications.  IMPRESSION: CT-guided core biopsy performed of right posterolateral chest wall mass between the tenth and eleventh ribs measuring approximately 1.5 x 2.5 cm.  Tobacco/Marijuana/Snuff/ETOH use: Never smoker  Past/Anticipated interventions by cardiothoracic surgery, if any:  Dr. Roxan Hockey 02/20/2018 -I think the best option for chance for  cure would be surgical resection.  This would be a significant operation as we would have to resect parts of the 10th and 11th ribs and possibly part of the ninth as well.  We would have to reconstruct this area.  There is no guarantee of a cure. -Other potential options would be stereotactic radiation or radiofrequency ablation.  I do not think that either 1 of those has is get a chance for cure but obviously there is less associated risk. -Mr. Natarajan's primary concern regarding surgery is the amount of pain.  That is difficult to predict but I do think it would be significant.  Of course given his cardiac history he is high risk from a cardiac perspective as well. -They are waiting for additional word from Dr. Alinda Money before deciding how to proceed.  If he decides to proceed with a chest wall resection we will be happy to schedule that. Plan: Right chest wall resection (10th and 11th) ribs with reconstruction.  Patient will call to schedule if he decides to proceed  Past/Anticipated interventions by medical oncology, if any:    Signs/Symptoms  Weight changes, if any: No changes noted, states he has maintained to same weight for the last 8 years.  Respiratory complaints, if any: No difficulty breathing  Hemoptysis, if any: None noted.  Pain issues, if any: No complaints of chest pains.  BP 132/60 (BP Location: Right Arm, Patient Position: Sitting)   Pulse (!) 57   Temp 97.6 F (36.4 C) (Oral)   Resp 20   SpO2 93%    Wt Readings from Last 3 Encounters:  02/20/18 202 lb (91.6 kg)  02/15/18 202 lb (91.6 kg)  01/30/18 198  lb (89.8 kg)   SAFETY ISSUES:  Prior radiation? No  Pacemaker/ICD? No  Possible current pregnancy? No  Is the patient on methotrexate? No  Current Complaints / other details:   Dialysis: MWF- 6:30-11  History: clear cell carcinoma of the kidney in 2010 and underwent radiofrequency ablation of the right renal tumor. He did very well following this until  2016 when he was found to have recurrent disease in the right kidney. He proceeded with right nephrectomy under the care of Dr. Alinda Money in November 4525.  68 year old man with a history of coronary disease, status post coronary bypass grafting in 2013, hypertension, hyperlipidemia, type 2 diabetes, bilateral renal cell carcinoma and end-stage renal disease.  He had a right nephrectomy in [place] in 2016.  That was followed by partial colectomy and liver resection for residual tumor in 2018 (Dr. Alinda Money).  He later had radiofrequency ablation of the tumor bed for residual tumor in January 2019.

## 2018-05-28 NOTE — Progress Notes (Signed)
Radiation Oncology         ([phone]) [phone] ________________________________  Name: [name]        MRN: [id number]  Date of Service: 05/29/2018 DOB: January 09, 1951  MO:QHUT, Gwyndolyn Saxon, MD  Raynelle Bring, MD     REFERRING PHYSICIAN: Raynelle Bring, MD   DIAGNOSIS: The primary encounter diagnosis was Clear cell carcinoma of right kidney [facility]). Diagnoses of Malignant neoplasm of prostate ([place]) and Metastatic renal cell carcinoma, unspecified laterality ([place]) were also pertinent to this visit.   HISTORY OF PRESENT ILLNESS: [name] is a 68 y.o. male seen at the request of Dr. Alinda Money for a history of right renal cell carcinoma.  The patient has a complicated medical history including complications of diabetes, and end-stage renal disease.  Unfortunately some of the complications include the need for hemodialysis, blindness, though in reviewing his chart it appears that despite these things he is very functional.  He also has coronary artery disease, and atrial fibrillation.  He was diagnosed with clear cell carcinoma of the kidney in 2010 and underwent radiofrequency ablation of the right renal tumor.  He did very well following this until 2016 when he was found to have recurrent disease in the right kidney.  He proceeded with right nephrectomy under the care of Dr. Alinda Money in November 2016.  Of note he did have a complicated recovery including an infection at the surgical site.  He went on to have reexcision of a recurrence that developed which was resected again by Dr. Alinda Money.  At the time he had significant adhesive disease requiring colectomy and resection of the liver, it did not appear based on pathology that the tumor was involving the liver parenchyma but just abutting it.  He was diagnosed with recurrence in January 2019 and underwent microwave thermal ablation again with [name] to the right aspect of the retroperitoneum.  He recently was found to have progression within a lesion in the  right posterior lateral chest wall at the 10th-11th intercostal space which is slowly progressed since April 2018 when it in retrospect measured 10 mm.  That was consistent with concerns for metastatic disease and a biopsy on 02/08/2018 confirmed clear cell renal carcinoma.  He met with Dr. Roxan Hockey to discuss options of resection and was offered this.  Does not appear that he elected to proceed and was sent to see [place] today by Dr. Alinda Money to discuss options of radiotherapy.   PREVIOUS RADIATION THERAPY: Yes   2014: Radioactive seed implant to treat low risk prostate cancer at [place]:  Past Medical History:  Diagnosis Date  . Anemia   . Arthritis    "back, neck" (07/28/2014)  . Blind    both eyes removed   . Bruises easily   . CAD, NATIVE VESSEL 01/08/2008      . Cellulitis late 1980's   "hospitalized; wrapped both legs; several times; no OR for this"  . Colon polyps   . Diabetic neuropathy ([place])   . Dialysis patient ([place])   . DM type 2 (diabetes mellitus, type 2) ([place])    no medications (07/28/2014) diet and excersie controlled not onmeds for 14-15 years   . Dysrhythmia    afib   . ESRD (end stage renal disease) on dialysis [facility])    [name]; Mon, Wed, Fri (07/28/2014)  . Fall    hx of fall and required left hip surgery   . Family history of breast cancer    mother  .  Heart murmur    hx of  . History of blood product transfusion   . HYPERLIPIDEMIA-MIXED 01/08/2008  . HYPERTENSION 01/27/2009   BP low , hx of HTN, Currently on meds to raise BP  . Hypotension   . Kidney stones   . Low blood pressure   . OVERWEIGHT/OBESITY 01/08/2008   Lost 205 lbs through diet and exercise.    . Peripheral vascular disease ([place])    vein stripping  . Pneumonia 2000;s X 1  . Prostate cancer ([place])   . Prosthetic eye globe    both eyes  . Renal cell carcinoma 2001 and 2003   "both kidneys"  . Renal insufficiency        PAST SURGICAL HISTORY: Past Surgical History:    Procedure Laterality Date  . AV FISTULA PLACEMENT Left 02/2010   LFA  . AV FISTULA PLACEMENT Left 01/12/2016   Procedure: LEFT BRACHIOCEPHALIC ARTERIOVENOUS (AV) FISTULA CREATION;  Surgeon: Angelia Mould, MD;  Location: [place];  Service: Vascular;  Laterality: Left;  . CHOLECYSTECTOMY OPEN  1992  . COLONOSCOPY  06/14/2011   Procedure: COLONOSCOPY;  Surgeon: [name], MD;  [facility]: [facility];  Service: Endoscopy;  Laterality: N/A;  . COLONOSCOPY N/A 07/24/2012   Procedure: COLONOSCOPY;  Surgeon: [name], MD;  [facility];  Service: Endoscopy;  Laterality: N/A;  . COLONOSCOPY    . COLONOSCOPY N/A 11/04/2015   Procedure: COLONOSCOPY;  Surgeon: Manus Gunning, MD;  Location: [facility] END[facility]COPY;  Service: Gastroenterology;  Laterality: N/A;  . COLONOSCOPY WITH PROPOFOL N/A 05/13/2014   Procedure: COLONOSCOPY WITH PROPOFOL;  Surgeon: [name], MD;  [facility];  Service: Endoscopy;  Laterality: N/A;  . CORONARY ARTERY BYPASS GRAFT  09/29/2011   Procedure: CORONARY ARTERY BYPASS GRAFTING (CABG);  Surgeon: [name], MD;  Location: [place];  Service: Open Heart Surgery;  Laterality: N/A;  Coronary Artery Bypass Graft times two utilizing the left internal mammary artery and the right greater saphenous vein harvested endoscopically.  . CYSTOSCOPY WITH RETROGRADE PYELOGRAM, URETEROSCOPY AND STENT PLACEMENT Left 07/28/2014   Procedure: CYSTOSCOPY WITH RETROGRADE PYELOGRAM, URETERAL BALLOON DILITATION, URETEROSCOPY AND LEFT STENT PLACEMENT;  Surgeon: [name], MD;  [facility];  Service: Urology;  Laterality: Left;  . ENUCLEATION  2003; 2006   bilateral; "diabetes; pain"  . FOOT AMPUTATION Right ~ 2002   right;  partial; "infection"  . FRACTURE SURGERY     hip fx  . HIP PINNING,CANNULATED Left 12/31/2013   Procedure: CANNULATED HIP PINNING;  Surgeon: Renette Butters, MD;  Location: [place];  Service: Orthopedics;  Laterality: Left;  Carm, FX  Table, [place]  . HOT HEMOSTASIS  06/14/2011   Procedure: HOT HEMOSTASIS (ARGON PLASMA COAGULATION/BICAP);  Surgeon: [name], MD;  Location[facility];  Service: Endoscopy;  Laterality: N/A;  . INSERTION OF DIALYSIS CATHETER Right 01/12/2016   Procedure: INSERTION OF DIALYSIS CATHETER;  Surgeon: Angelia Mould, MD;  Location: [place];  Service: Vascular;  Laterality: Right;  . INSERTION PROSTATE RADIATION SEED  03/2012  . IR GENERIC HISTORICAL  01/13/2016   IR RADIOLOGIST EVAL & MGMT 01/13/2016 Aletta Edouard, MD [facility] INTERV RAD  . IR RADIOLOGIST EVAL & MGMT  03/02/2017  . IR RADIOLOGIST EVAL & MGMT  05/18/2017  . IR RADIOLOGIST EVAL & MGMT  07/27/2017  . LAPAROTOMY N/A 04/07/2016   Procedure: EXPLORATORY LAPAROTOMY AND RESECTION OF RETROPERITONEAL MASS;  Surgeon: Raynelle Bring, MD;  [facility];  Service:  Urology;  Laterality: N/A;  . LEFT HEART CATHETERIZATION WITH CORONARY ANGIOGRAM N/A 09/27/2011   Procedure: LEFT HEART CATHETERIZATION WITH CORONARY ANGIOGRAM;  Surgeon: [name], MD;  Location: [facility] C[facility] LAB;  Service: Cardiovascular;  Laterality: N/A;  . LIGATION OF ARTERIOVENOUS  FISTULA Left 01/12/2016   Procedure: LIGATION OF LEFT RADIOCEPHALIC ARTERIOVENOUS  FISTULA;  Surgeon: Angelia Mould, MD;  Location: [place];  Service: Vascular;  Laterality: Left;  . LIGATION OF COMPETING BRANCHES OF ARTERIOVENOUS FISTULA Left 07/29/2014   Procedure: LIGATION OF COMPETING BRANCHES OF LEFT ARM ARTERIOVENOUS FISTULA;  Surgeon: [name], MD;  Location: [place];  Service: Vascular;  Laterality: Left;  . NEPHRECTOMY Right 01/30/2015   done at [place]  . PARTIAL COLECTOMY Right 04/07/2016   Procedure: RIGHT COLECTOMY AND PARTIAL LIVER RESECTION;  Surgeon: Raynelle Bring, MD;  [facility];  Service: Urology;  Laterality: Right;  . RADIOFREQUENCY ABLATION N/A 04/19/2017   Procedure: CT MICROWAVE THERMAL ABLATION;  Surgeon: Aletta Edouard, MD;  [facility];   Service: Anesthesiology;  Laterality: N/A;  . RADIOFREQUENCY ABLATION KIDNEY  2010-2012   "twice; one on each side; for cancer"  . REVISON OF ARTERIOVENOUS FISTULA Left 09/01/2015   Procedure: EXPLORATION LEFT LOWER ARM  RADIOCEPHALIC ARTERIOVENOUS FISTULA;  Surgeon: Conrad Lawson, MD;  Location: [place];  Service: Vascular;  Laterality: Left;  [name] VARICOSE VEIN SURGERY  mid 1980's    BLE; "knees down; both legs; 2 separate times"     FAMILY HISTORY:  Family History  Problem Relation Age of Onset  . Cancer Mother 69       breast, spine and ovarian  . Heart disease Mother   . Hyperlipidemia Mother   . Hypertension Mother   . Varicose Veins Mother   . Emphysema Father   . Cancer Father 15       kidney, prostate  . Heart disease Father   . Hyperlipidemia Father   . Hypertension Father   . Cancer Sister        ovarian  . Cancer Brother        lung  . Heart disease Brother   . Hyperlipidemia Brother   . Hypertension Brother   . Heart attack Brother   . Peripheral vascular disease Brother   . Cancer Other   . Coronary artery disease Other   . Kidney disease Other   . Breast cancer Other   . Ovarian cancer Other   . Lung cancer Other   . Diabetes Other   . Malignant hyperthermia Neg Hx      SOCIAL HISTORY:  reports that he has never smoked. He has never used smokeless tobacco. He reports that he does not drink alcohol or use drugs.   ALLERGIES: Codeine and Tape   MEDICATIONS:  Current Outpatient Medications  Medication Sig Dispense Refill  . aspirin EC 81 MG tablet Take 81 mg by mouth at bedtime.    [name] atorvastatin (LIPITOR) 10 MG tablet Take 10 mg by mouth daily.    [name] dextromethorphan-guaiFENesin (MUCINEX DM) 30-600 MG 12hr tablet Take 1 tablet by mouth 2 (two) times daily. 30 tablet 1  . Methoxy PEG-Epoetin Beta (MIRCERA) 50 MCG/0.3ML SOSY Inject 225 mcg as directed every 14 (fourteen) days.     . midodrine (PROAMATINE) 10 MG tablet Take 1 tablet (10 mg total) by mouth 3  (three) times daily with meals. 90 tablet 0  . multivitamin (RENA-VIT) TABS tablet Take 1 tablet by mouth daily.    [name]  oseltamivir (TAMIFLU) 75 MG capsule TAKE 1 CAPSULE BY MOUTH TWICE A DAY FOR 5 DAYS.    [name] pantoprazole (PROTONIX) 40 MG tablet Take 1 tablet (40 mg total) by mouth daily before breakfast. 30 tablet 3  . Probiotic Product (ALIGN) 4 MG CAPS Take 1 capsule by mouth daily.    . sevelamer carbonate (RENVELA) 800 MG tablet Take 2,400-4,800 mg by mouth See admin instructions. Take 6 tablets (4800 mg) by mouth 3 times daily with meals and 3 tablets (2400 mg) 2 times daily with snacks     No current facility-administered medications for this visit.      REVIEW OF SYSTEMS: On review of systems, the patient reports that he is doing well overall. He denies any chest pain, shortness of breath, cough, fevers, chills, night sweats, unintended weight changes. He denies any bowel or bladder disturbances, and denies abdominal pain, nausea or vomiting. He denies any new musculoskeletal or joint aches or pains. A complete review of systems is obtained and is otherwise negative.     PHYSICAL EXAM:  Wt Readings from Last 3 Encounters:  02/20/18 202 lb (91.6 kg)  02/15/18 202 lb (91.6 kg)  01/30/18 198 lb (89.8 kg)   Temp Readings from Last 3 Encounters:  03/23/18 97.7 F (36.5 C) (Oral)  02/08/18 97.7 F (36.5 C) (Oral)  07/27/17 (!) 97.3 F (36.3 C) (Oral)   BP Readings from Last 3 Encounters:  03/23/18 (!) 128/59  02/20/18 119/65  02/15/18 (!) 158/64   Pulse Readings from Last 3 Encounters:  03/23/18 60  02/20/18 (!) 56  02/15/18 (!) 57    /10  In general this is a chronically ill appearing caucasian male in no acute distress. He is alert and oriented x4 and appropriate throughout the examination. HEENT reveals that the patient is normocephalic, atraumatic. EOMs are intact.    ECOG = 1  0 - Asymptomatic (Fully active, able to carry on all predisease activities without  restriction)  1 - Symptomatic but completely ambulatory (Restricted in physically strenuous activity but ambulatory and able to carry out work of a light or sedentary nature. For example, light housework, office work)  2 - Symptomatic, <50% in bed during the day (Ambulatory and capable of all self care but unable to carry out any work activities. Up and about more than 50% of waking hours)  3 - Symptomatic, >50% in bed, but not bedbound (Capable of only limited self-care, confined to bed or chair 50% or more of waking hours)  4 - Bedbound (Completely disabled. Cannot carry on any self-care. Totally confined to bed or chair)  5 - Death   Eustace Pen MM, Creech RH, Tormey DC, et al. [phone]). "Toxicity and response criteria of the [facility]". [place] Oncol. 5 (6): 649-55    LABORATORY DATA:  Lab Results  Component Value Date   WBC 3.3 (L) 03/23/2018   HGB 9.1 (L) 03/23/2018   HCT 27.9 (L) 03/23/2018   MCV 104.5 (H) 03/23/2018   PLT 81 (L) 03/23/2018   Lab Results  Component Value Date   NA 139 03/22/2018   K 4.1 03/22/2018   CL 97 (L) 03/22/2018   CO2 29 03/22/2018   Lab Results  Component Value Date   ALT 23 03/17/2018   AST 21 03/17/2018   ALKPHOS 96 03/17/2018   BILITOT 0.9 03/17/2018      RADIOGRAPHY: No results found.     IMPRESSION/PLAN: 1.  Recurrent clear cell carcinoma of the kidney with local recurrence to the chest wall. I  Discussed the  findings and reviews the nature of renal cell carcinomas. He reviews the patients episodes of recurrence which have remained localized to the surgical site/tract of prior directed therapy. I agree that radiotherapy would be an option to consider for patients who elect to forgo or who are not medical candidates for surgical resection. We discussed the risks, benefits, short, and long term effects of radiotherapy, and the patient is interested in proceeding. We discussed the delivery and logistics of  radiotherapy and anticipates a course of stereotactic body radiotherapy over about 5 fractions. Given that his last CT scan was in September, I discussed repeating a CT chest/abdomen/pelvis and have the patient return to clinic to discuss the results. The patient will then decide if he wants to proceed with SBRT. 2.         ESRD on HD. The patient will continue Dialysis per [facility] MWF, and we will coordinate treatments to try to avoid those days. 3.         Low risk adenocarcinoma of the prostate. The patient continues to be followed by Dr. Alinda Money and is clinically NED by stable PSA.   In a visit lasting 40 minutes, greater than 50% of the time was spent face to face discussing his case, and coordinating the patient's care.   ------------------------------------------------  Jodelle Gross, MD, PhD

## 2018-05-29 ENCOUNTER — Ambulatory Visit: Payer: Medicare Other | Admitting: Radiation Oncology

## 2018-05-29 ENCOUNTER — Encounter: Payer: Self-pay | Admitting: Radiation Oncology

## 2018-05-29 ENCOUNTER — Ambulatory Visit
Admission: RE | Admit: 2018-05-29 | Discharge: 2018-05-29 | Disposition: A | Discharge status: Home / Self Care | Payer: Medicare Other | Source: Ambulatory Visit | Attending: Radiation Oncology | Admitting: Radiation Oncology

## 2018-05-29 ENCOUNTER — Other Ambulatory Visit: Payer: Self-pay

## 2018-05-29 VITALS — BP 132/60 | HR 57 | Temp 97.6°F | Resp 20

## 2018-05-29 DIAGNOSIS — I4891 Unspecified atrial fibrillation: Secondary | ICD-10-CM | POA: Diagnosis not present

## 2018-05-29 DIAGNOSIS — I10 Essential (primary) hypertension: Secondary | ICD-10-CM | POA: Insufficient documentation

## 2018-05-29 DIAGNOSIS — E1140 Type 2 diabetes mellitus with diabetic neuropathy, unspecified: Secondary | ICD-10-CM | POA: Insufficient documentation

## 2018-05-29 DIAGNOSIS — C7989 Secondary malignant neoplasm of other specified sites: Secondary | ICD-10-CM

## 2018-05-29 DIAGNOSIS — C641 Malignant neoplasm of right kidney, except renal pelvis: Secondary | ICD-10-CM

## 2018-05-29 DIAGNOSIS — E1122 Type 2 diabetes mellitus with diabetic chronic kidney disease: Secondary | ICD-10-CM | POA: Insufficient documentation

## 2018-05-29 DIAGNOSIS — Z905 Acquired absence of kidney: Secondary | ICD-10-CM | POA: Insufficient documentation

## 2018-05-29 DIAGNOSIS — I2510 Atherosclerotic heart disease of native coronary artery without angina pectoris: Secondary | ICD-10-CM | POA: Diagnosis not present

## 2018-05-29 DIAGNOSIS — C649 Malignant neoplasm of unspecified kidney, except renal pelvis: Secondary | ICD-10-CM

## 2018-05-29 DIAGNOSIS — C61 Malignant neoplasm of prostate: Secondary | ICD-10-CM | POA: Diagnosis not present

## 2018-05-29 DIAGNOSIS — Z923 Personal history of irradiation: Secondary | ICD-10-CM | POA: Diagnosis not present

## 2018-05-29 DIAGNOSIS — N186 End stage renal disease: Secondary | ICD-10-CM | POA: Diagnosis not present

## 2018-05-29 DIAGNOSIS — Z992 Dependence on renal dialysis: Secondary | ICD-10-CM | POA: Diagnosis not present

## 2018-05-30 DIAGNOSIS — N2581 Secondary hyperparathyroidism of renal origin: Secondary | ICD-10-CM | POA: Diagnosis not present

## 2018-05-30 DIAGNOSIS — D509 Iron deficiency anemia, unspecified: Secondary | ICD-10-CM | POA: Diagnosis not present

## 2018-05-30 DIAGNOSIS — E119 Type 2 diabetes mellitus without complications: Secondary | ICD-10-CM | POA: Diagnosis not present

## 2018-05-30 DIAGNOSIS — K7689 Other specified diseases of liver: Secondary | ICD-10-CM | POA: Diagnosis not present

## 2018-05-30 DIAGNOSIS — N186 End stage renal disease: Secondary | ICD-10-CM | POA: Diagnosis not present

## 2018-05-30 DIAGNOSIS — D631 Anemia in chronic kidney disease: Secondary | ICD-10-CM | POA: Diagnosis not present

## 2018-05-31 NOTE — Addendum Note (Signed)
Encounter addended by: Freeman Caldron, PA-C on: 05/31/2018 8:46 AM  Actions taken: Delete clinical note

## 2018-06-01 DIAGNOSIS — D509 Iron deficiency anemia, unspecified: Secondary | ICD-10-CM | POA: Diagnosis not present

## 2018-06-01 DIAGNOSIS — N186 End stage renal disease: Secondary | ICD-10-CM | POA: Diagnosis not present

## 2018-06-01 DIAGNOSIS — D631 Anemia in chronic kidney disease: Secondary | ICD-10-CM | POA: Diagnosis not present

## 2018-06-01 DIAGNOSIS — K7689 Other specified diseases of liver: Secondary | ICD-10-CM | POA: Diagnosis not present

## 2018-06-01 DIAGNOSIS — N2581 Secondary hyperparathyroidism of renal origin: Secondary | ICD-10-CM | POA: Diagnosis not present

## 2018-06-01 DIAGNOSIS — E119 Type 2 diabetes mellitus without complications: Secondary | ICD-10-CM | POA: Diagnosis not present

## 2018-06-03 DIAGNOSIS — Z992 Dependence on renal dialysis: Secondary | ICD-10-CM | POA: Diagnosis not present

## 2018-06-03 DIAGNOSIS — E1129 Type 2 diabetes mellitus with other diabetic kidney complication: Secondary | ICD-10-CM | POA: Diagnosis not present

## 2018-06-03 DIAGNOSIS — N186 End stage renal disease: Secondary | ICD-10-CM | POA: Diagnosis not present

## 2018-06-04 DIAGNOSIS — D509 Iron deficiency anemia, unspecified: Secondary | ICD-10-CM | POA: Diagnosis not present

## 2018-06-04 DIAGNOSIS — K7689 Other specified diseases of liver: Secondary | ICD-10-CM | POA: Diagnosis not present

## 2018-06-04 DIAGNOSIS — D631 Anemia in chronic kidney disease: Secondary | ICD-10-CM | POA: Diagnosis not present

## 2018-06-04 DIAGNOSIS — N186 End stage renal disease: Secondary | ICD-10-CM | POA: Diagnosis not present

## 2018-06-04 DIAGNOSIS — E119 Type 2 diabetes mellitus without complications: Secondary | ICD-10-CM | POA: Diagnosis not present

## 2018-06-04 DIAGNOSIS — N2581 Secondary hyperparathyroidism of renal origin: Secondary | ICD-10-CM | POA: Diagnosis not present

## 2018-06-06 DIAGNOSIS — K7689 Other specified diseases of liver: Secondary | ICD-10-CM | POA: Diagnosis not present

## 2018-06-06 DIAGNOSIS — D631 Anemia in chronic kidney disease: Secondary | ICD-10-CM | POA: Diagnosis not present

## 2018-06-06 DIAGNOSIS — N2581 Secondary hyperparathyroidism of renal origin: Secondary | ICD-10-CM | POA: Diagnosis not present

## 2018-06-06 DIAGNOSIS — N186 End stage renal disease: Secondary | ICD-10-CM | POA: Diagnosis not present

## 2018-06-06 DIAGNOSIS — D509 Iron deficiency anemia, unspecified: Secondary | ICD-10-CM | POA: Diagnosis not present

## 2018-06-06 DIAGNOSIS — E119 Type 2 diabetes mellitus without complications: Secondary | ICD-10-CM | POA: Diagnosis not present

## 2018-06-08 DIAGNOSIS — D509 Iron deficiency anemia, unspecified: Secondary | ICD-10-CM | POA: Diagnosis not present

## 2018-06-08 DIAGNOSIS — E119 Type 2 diabetes mellitus without complications: Secondary | ICD-10-CM | POA: Diagnosis not present

## 2018-06-08 DIAGNOSIS — D631 Anemia in chronic kidney disease: Secondary | ICD-10-CM | POA: Diagnosis not present

## 2018-06-08 DIAGNOSIS — N186 End stage renal disease: Secondary | ICD-10-CM | POA: Diagnosis not present

## 2018-06-08 DIAGNOSIS — K7689 Other specified diseases of liver: Secondary | ICD-10-CM | POA: Diagnosis not present

## 2018-06-08 DIAGNOSIS — N2581 Secondary hyperparathyroidism of renal origin: Secondary | ICD-10-CM | POA: Diagnosis not present

## 2018-06-11 ENCOUNTER — Telehealth: Payer: Self-pay | Admitting: *Deleted

## 2018-06-11 DIAGNOSIS — K7689 Other specified diseases of liver: Secondary | ICD-10-CM | POA: Diagnosis not present

## 2018-06-11 DIAGNOSIS — E119 Type 2 diabetes mellitus without complications: Secondary | ICD-10-CM | POA: Diagnosis not present

## 2018-06-11 DIAGNOSIS — N186 End stage renal disease: Secondary | ICD-10-CM | POA: Diagnosis not present

## 2018-06-11 DIAGNOSIS — N2581 Secondary hyperparathyroidism of renal origin: Secondary | ICD-10-CM | POA: Diagnosis not present

## 2018-06-11 DIAGNOSIS — D509 Iron deficiency anemia, unspecified: Secondary | ICD-10-CM | POA: Diagnosis not present

## 2018-06-11 DIAGNOSIS — D631 Anemia in chronic kidney disease: Secondary | ICD-10-CM | POA: Diagnosis not present

## 2018-06-11 NOTE — Telephone Encounter (Signed)
CALLED [facility] AND SPOKE WITH THIS PATIENT'S NURSE -STEPHANIE AND INFORMED OF CT FOR 06-14-18 - ARRIVAL TIME - 1:15 PM @ [facility], PT. TO BE NPO- 4 HRS. PRIOR TO TEST, PATIENT TO PICK-UP PREP ON  06-13-18, STEPHANIE NURSE @ [place] UNDERSTANDING THIS

## 2018-06-13 ENCOUNTER — Ambulatory Visit (HOSPITAL_COMMUNITY): Payer: Medicare Other

## 2018-06-13 DIAGNOSIS — D631 Anemia in chronic kidney disease: Secondary | ICD-10-CM | POA: Diagnosis not present

## 2018-06-13 DIAGNOSIS — N2581 Secondary hyperparathyroidism of renal origin: Secondary | ICD-10-CM | POA: Diagnosis not present

## 2018-06-13 DIAGNOSIS — E119 Type 2 diabetes mellitus without complications: Secondary | ICD-10-CM | POA: Diagnosis not present

## 2018-06-13 DIAGNOSIS — N186 End stage renal disease: Secondary | ICD-10-CM | POA: Diagnosis not present

## 2018-06-13 DIAGNOSIS — K7689 Other specified diseases of liver: Secondary | ICD-10-CM | POA: Diagnosis not present

## 2018-06-13 DIAGNOSIS — D509 Iron deficiency anemia, unspecified: Secondary | ICD-10-CM | POA: Diagnosis not present

## 2018-06-14 ENCOUNTER — Ambulatory Visit (HOSPITAL_COMMUNITY)
Admission: RE | Admit: 2018-06-14 | Discharge: 2018-06-14 | Disposition: A | Discharge status: Home / Self Care | Payer: Medicare Other | Source: Ambulatory Visit | Attending: Radiation Oncology | Admitting: Radiation Oncology

## 2018-06-14 ENCOUNTER — Other Ambulatory Visit: Payer: Self-pay

## 2018-06-14 DIAGNOSIS — N289 Disorder of kidney and ureter, unspecified: Secondary | ICD-10-CM | POA: Diagnosis not present

## 2018-06-14 DIAGNOSIS — J439 Emphysema, unspecified: Secondary | ICD-10-CM | POA: Diagnosis not present

## 2018-06-14 DIAGNOSIS — C7989 Secondary malignant neoplasm of other specified sites: Secondary | ICD-10-CM | POA: Insufficient documentation

## 2018-06-15 DIAGNOSIS — N2581 Secondary hyperparathyroidism of renal origin: Secondary | ICD-10-CM | POA: Diagnosis not present

## 2018-06-15 DIAGNOSIS — K7689 Other specified diseases of liver: Secondary | ICD-10-CM | POA: Diagnosis not present

## 2018-06-15 DIAGNOSIS — N186 End stage renal disease: Secondary | ICD-10-CM | POA: Diagnosis not present

## 2018-06-15 DIAGNOSIS — D631 Anemia in chronic kidney disease: Secondary | ICD-10-CM | POA: Diagnosis not present

## 2018-06-15 DIAGNOSIS — E119 Type 2 diabetes mellitus without complications: Secondary | ICD-10-CM | POA: Diagnosis not present

## 2018-06-15 DIAGNOSIS — D509 Iron deficiency anemia, unspecified: Secondary | ICD-10-CM | POA: Diagnosis not present

## 2018-06-18 DIAGNOSIS — D509 Iron deficiency anemia, unspecified: Secondary | ICD-10-CM | POA: Diagnosis not present

## 2018-06-18 DIAGNOSIS — N2581 Secondary hyperparathyroidism of renal origin: Secondary | ICD-10-CM | POA: Diagnosis not present

## 2018-06-18 DIAGNOSIS — N186 End stage renal disease: Secondary | ICD-10-CM | POA: Diagnosis not present

## 2018-06-18 DIAGNOSIS — D631 Anemia in chronic kidney disease: Secondary | ICD-10-CM | POA: Diagnosis not present

## 2018-06-18 DIAGNOSIS — E119 Type 2 diabetes mellitus without complications: Secondary | ICD-10-CM | POA: Diagnosis not present

## 2018-06-18 DIAGNOSIS — K7689 Other specified diseases of liver: Secondary | ICD-10-CM | POA: Diagnosis not present

## 2018-06-20 DIAGNOSIS — D509 Iron deficiency anemia, unspecified: Secondary | ICD-10-CM | POA: Diagnosis not present

## 2018-06-20 DIAGNOSIS — N2581 Secondary hyperparathyroidism of renal origin: Secondary | ICD-10-CM | POA: Diagnosis not present

## 2018-06-20 DIAGNOSIS — D631 Anemia in chronic kidney disease: Secondary | ICD-10-CM | POA: Diagnosis not present

## 2018-06-20 DIAGNOSIS — K7689 Other specified diseases of liver: Secondary | ICD-10-CM | POA: Diagnosis not present

## 2018-06-20 DIAGNOSIS — N186 End stage renal disease: Secondary | ICD-10-CM | POA: Diagnosis not present

## 2018-06-20 DIAGNOSIS — E119 Type 2 diabetes mellitus without complications: Secondary | ICD-10-CM | POA: Diagnosis not present

## 2018-06-22 DIAGNOSIS — K7689 Other specified diseases of liver: Secondary | ICD-10-CM | POA: Diagnosis not present

## 2018-06-22 DIAGNOSIS — N186 End stage renal disease: Secondary | ICD-10-CM | POA: Diagnosis not present

## 2018-06-22 DIAGNOSIS — E119 Type 2 diabetes mellitus without complications: Secondary | ICD-10-CM | POA: Diagnosis not present

## 2018-06-22 DIAGNOSIS — N2581 Secondary hyperparathyroidism of renal origin: Secondary | ICD-10-CM | POA: Diagnosis not present

## 2018-06-22 DIAGNOSIS — D631 Anemia in chronic kidney disease: Secondary | ICD-10-CM | POA: Diagnosis not present

## 2018-06-22 DIAGNOSIS — D509 Iron deficiency anemia, unspecified: Secondary | ICD-10-CM | POA: Diagnosis not present

## 2018-06-25 DIAGNOSIS — K7689 Other specified diseases of liver: Secondary | ICD-10-CM | POA: Diagnosis not present

## 2018-06-25 DIAGNOSIS — N186 End stage renal disease: Secondary | ICD-10-CM | POA: Diagnosis not present

## 2018-06-25 DIAGNOSIS — N2581 Secondary hyperparathyroidism of renal origin: Secondary | ICD-10-CM | POA: Diagnosis not present

## 2018-06-25 DIAGNOSIS — D509 Iron deficiency anemia, unspecified: Secondary | ICD-10-CM | POA: Diagnosis not present

## 2018-06-25 DIAGNOSIS — E119 Type 2 diabetes mellitus without complications: Secondary | ICD-10-CM | POA: Diagnosis not present

## 2018-06-25 DIAGNOSIS — D631 Anemia in chronic kidney disease: Secondary | ICD-10-CM | POA: Diagnosis not present

## 2018-06-27 ENCOUNTER — Telehealth: Payer: Self-pay | Admitting: Radiation Oncology

## 2018-06-27 DIAGNOSIS — E119 Type 2 diabetes mellitus without complications: Secondary | ICD-10-CM | POA: Diagnosis not present

## 2018-06-27 DIAGNOSIS — N186 End stage renal disease: Secondary | ICD-10-CM | POA: Diagnosis not present

## 2018-06-27 DIAGNOSIS — D509 Iron deficiency anemia, unspecified: Secondary | ICD-10-CM | POA: Diagnosis not present

## 2018-06-27 DIAGNOSIS — K7689 Other specified diseases of liver: Secondary | ICD-10-CM | POA: Diagnosis not present

## 2018-06-27 DIAGNOSIS — D631 Anemia in chronic kidney disease: Secondary | ICD-10-CM | POA: Diagnosis not present

## 2018-06-27 DIAGNOSIS — N2581 Secondary hyperparathyroidism of renal origin: Secondary | ICD-10-CM | POA: Diagnosis not present

## 2018-06-27 NOTE — Telephone Encounter (Signed)
New Message:    Cld pt to set up FU-New appt due to in basket message received.

## 2018-06-28 ENCOUNTER — Telehealth: Payer: Self-pay | Admitting: Radiation Oncology

## 2018-06-28 DIAGNOSIS — D649 Anemia, unspecified: Secondary | ICD-10-CM | POA: Diagnosis not present

## 2018-06-28 DIAGNOSIS — I25810 Atherosclerosis of coronary artery bypass graft(s) without angina pectoris: Secondary | ICD-10-CM | POA: Diagnosis not present

## 2018-06-28 DIAGNOSIS — Z992 Dependence on renal dialysis: Secondary | ICD-10-CM | POA: Diagnosis not present

## 2018-06-28 DIAGNOSIS — E1151 Type 2 diabetes mellitus with diabetic peripheral angiopathy without gangrene: Secondary | ICD-10-CM | POA: Diagnosis not present

## 2018-06-28 DIAGNOSIS — N186 End stage renal disease: Secondary | ICD-10-CM | POA: Diagnosis not present

## 2018-06-28 DIAGNOSIS — Z8546 Personal history of malignant neoplasm of prostate: Secondary | ICD-10-CM | POA: Diagnosis not present

## 2018-06-28 DIAGNOSIS — I120 Hypertensive chronic kidney disease with stage 5 chronic kidney disease or end stage renal disease: Secondary | ICD-10-CM | POA: Diagnosis not present

## 2018-06-28 DIAGNOSIS — C7989 Secondary malignant neoplasm of other specified sites: Secondary | ICD-10-CM | POA: Diagnosis not present

## 2018-06-28 DIAGNOSIS — C7900 Secondary malignant neoplasm of unspecified kidney and renal pelvis: Secondary | ICD-10-CM | POA: Diagnosis not present

## 2018-06-28 NOTE — Telephone Encounter (Signed)
New message:      Lft vcmail for patient to return call to schedule appt.

## 2018-06-29 DIAGNOSIS — K7689 Other specified diseases of liver: Secondary | ICD-10-CM | POA: Diagnosis not present

## 2018-06-29 DIAGNOSIS — E119 Type 2 diabetes mellitus without complications: Secondary | ICD-10-CM | POA: Diagnosis not present

## 2018-06-29 DIAGNOSIS — D631 Anemia in chronic kidney disease: Secondary | ICD-10-CM | POA: Diagnosis not present

## 2018-06-29 DIAGNOSIS — N186 End stage renal disease: Secondary | ICD-10-CM | POA: Diagnosis not present

## 2018-06-29 DIAGNOSIS — N2581 Secondary hyperparathyroidism of renal origin: Secondary | ICD-10-CM | POA: Diagnosis not present

## 2018-06-29 DIAGNOSIS — D509 Iron deficiency anemia, unspecified: Secondary | ICD-10-CM | POA: Diagnosis not present

## 2018-07-02 DIAGNOSIS — D631 Anemia in chronic kidney disease: Secondary | ICD-10-CM | POA: Diagnosis not present

## 2018-07-02 DIAGNOSIS — E119 Type 2 diabetes mellitus without complications: Secondary | ICD-10-CM | POA: Diagnosis not present

## 2018-07-02 DIAGNOSIS — N2581 Secondary hyperparathyroidism of renal origin: Secondary | ICD-10-CM | POA: Diagnosis not present

## 2018-07-02 DIAGNOSIS — K7689 Other specified diseases of liver: Secondary | ICD-10-CM | POA: Diagnosis not present

## 2018-07-02 DIAGNOSIS — D509 Iron deficiency anemia, unspecified: Secondary | ICD-10-CM | POA: Diagnosis not present

## 2018-07-02 DIAGNOSIS — N186 End stage renal disease: Secondary | ICD-10-CM | POA: Diagnosis not present

## 2018-07-04 DIAGNOSIS — D509 Iron deficiency anemia, unspecified: Secondary | ICD-10-CM | POA: Diagnosis not present

## 2018-07-04 DIAGNOSIS — N2581 Secondary hyperparathyroidism of renal origin: Secondary | ICD-10-CM | POA: Diagnosis not present

## 2018-07-04 DIAGNOSIS — E1129 Type 2 diabetes mellitus with other diabetic kidney complication: Secondary | ICD-10-CM | POA: Diagnosis not present

## 2018-07-04 DIAGNOSIS — E119 Type 2 diabetes mellitus without complications: Secondary | ICD-10-CM | POA: Diagnosis not present

## 2018-07-04 DIAGNOSIS — Z992 Dependence on renal dialysis: Secondary | ICD-10-CM | POA: Diagnosis not present

## 2018-07-04 DIAGNOSIS — N186 End stage renal disease: Secondary | ICD-10-CM | POA: Diagnosis not present

## 2018-07-04 DIAGNOSIS — D631 Anemia in chronic kidney disease: Secondary | ICD-10-CM | POA: Diagnosis not present

## 2018-07-04 DIAGNOSIS — K7689 Other specified diseases of liver: Secondary | ICD-10-CM | POA: Diagnosis not present

## 2018-07-06 DIAGNOSIS — K7689 Other specified diseases of liver: Secondary | ICD-10-CM | POA: Diagnosis not present

## 2018-07-06 DIAGNOSIS — N186 End stage renal disease: Secondary | ICD-10-CM | POA: Diagnosis not present

## 2018-07-06 DIAGNOSIS — D631 Anemia in chronic kidney disease: Secondary | ICD-10-CM | POA: Diagnosis not present

## 2018-07-06 DIAGNOSIS — E119 Type 2 diabetes mellitus without complications: Secondary | ICD-10-CM | POA: Diagnosis not present

## 2018-07-06 DIAGNOSIS — D509 Iron deficiency anemia, unspecified: Secondary | ICD-10-CM | POA: Diagnosis not present

## 2018-07-06 DIAGNOSIS — N2581 Secondary hyperparathyroidism of renal origin: Secondary | ICD-10-CM | POA: Diagnosis not present

## 2018-07-09 ENCOUNTER — Telehealth: Payer: Self-pay | Admitting: Radiation Oncology

## 2018-07-09 ENCOUNTER — Telehealth: Payer: Self-pay

## 2018-07-09 DIAGNOSIS — E119 Type 2 diabetes mellitus without complications: Secondary | ICD-10-CM | POA: Diagnosis not present

## 2018-07-09 DIAGNOSIS — K7689 Other specified diseases of liver: Secondary | ICD-10-CM | POA: Diagnosis not present

## 2018-07-09 DIAGNOSIS — D631 Anemia in chronic kidney disease: Secondary | ICD-10-CM | POA: Diagnosis not present

## 2018-07-09 DIAGNOSIS — D509 Iron deficiency anemia, unspecified: Secondary | ICD-10-CM | POA: Diagnosis not present

## 2018-07-09 DIAGNOSIS — N2581 Secondary hyperparathyroidism of renal origin: Secondary | ICD-10-CM | POA: Diagnosis not present

## 2018-07-09 DIAGNOSIS — N186 End stage renal disease: Secondary | ICD-10-CM | POA: Diagnosis not present

## 2018-07-09 NOTE — Telephone Encounter (Signed)
Spoke with pt advised that follow-up appointment will be conducted over the phone with ADara Lords PA on 4/7 at scheduled appointment time. Pt verbalized understanding. Pt advised best number to call is [phone]

## 2018-07-09 NOTE — Telephone Encounter (Signed)
I received a call from Chipper Herb, Mr. Arline's personal driver. He will bring Mr. Foisy for his simulation on Thursday and we will discuss his case tomorrow by phone. Mr. Binning's wife unfortunately per Mr. Oletta Lamas has tested positive for Covid, but he states it has been 21 days since they last saw one another due to his current residence in a nursing facility, and her need for hospitalization. I will notify nursing, and the staff at the front desk so they are aware.

## 2018-07-10 ENCOUNTER — Ambulatory Visit
Admission: RE | Admit: 2018-07-10 | Discharge: 2018-07-10 | Disposition: A | Discharge status: Home / Self Care | Payer: Medicare Other | Source: Ambulatory Visit | Attending: Radiation Oncology | Admitting: Radiation Oncology

## 2018-07-10 ENCOUNTER — Encounter: Payer: Self-pay | Admitting: Radiation Oncology

## 2018-07-10 ENCOUNTER — Other Ambulatory Visit: Payer: Self-pay

## 2018-07-10 DIAGNOSIS — Z992 Dependence on renal dialysis: Secondary | ICD-10-CM | POA: Diagnosis not present

## 2018-07-10 DIAGNOSIS — C61 Malignant neoplasm of prostate: Secondary | ICD-10-CM | POA: Diagnosis not present

## 2018-07-10 DIAGNOSIS — C649 Malignant neoplasm of unspecified kidney, except renal pelvis: Secondary | ICD-10-CM

## 2018-07-10 DIAGNOSIS — Z905 Acquired absence of kidney: Secondary | ICD-10-CM | POA: Diagnosis not present

## 2018-07-10 DIAGNOSIS — N186 End stage renal disease: Secondary | ICD-10-CM | POA: Diagnosis not present

## 2018-07-10 DIAGNOSIS — R9721 Rising PSA following treatment for malignant neoplasm of prostate: Secondary | ICD-10-CM | POA: Diagnosis not present

## 2018-07-10 DIAGNOSIS — C7989 Secondary malignant neoplasm of other specified sites: Secondary | ICD-10-CM | POA: Diagnosis not present

## 2018-07-10 DIAGNOSIS — C641 Malignant neoplasm of right kidney, except renal pelvis: Secondary | ICD-10-CM

## 2018-07-10 DIAGNOSIS — Z85528 Personal history of other malignant neoplasm of kidney: Secondary | ICD-10-CM | POA: Diagnosis not present

## 2018-07-10 NOTE — Progress Notes (Signed)
Radiation Oncology         [phone] ________________________________  Name: [name]        MRN: [id number]  Date of Service: 07/10/2018 DOB: July 09, 1950  HT:XHFS, Gwyndolyn Saxon, MD  Raynelle Bring, MD     REFERRING PHYSICIAN: Raynelle Bring, MD   DIAGNOSIS: The primary encounter diagnosis was Renal cell cancer, right [facility]). Diagnoses of Clear cell carcinoma of right kidney ([place]) and Metastatic renal cell carcinoma, unspecified laterality ([place]) were also pertinent to this visit.  Follow Up New Visit - Conducted via telephone due to current COVID-19 concerns for limiting patient exposure  I spoke with the patient to conduct this consult visit via telephone to spare the patient unnecessary potential exposure in the healthcare setting during the current COVID-19 pandemic. The patient was notified in advance and was offered a [place] meeting to allow for face to face communication but unfortunately reported that they did not have the appropriate resources/technology to support such a visit and instead preferred to proceed with a telephone consult.   HISTORY OF PRESENT ILLNESS: [name] is a 68 y.o. male seen at the request of Dr. Alinda Money for a history of right renal cell carcinoma.  The patient has a complicated medical history including complications of diabetes, and end-stage renal disease.  Unfortunately some of the complications include the need for hemodialysis, blindness, though in reviewing his chart it appears that despite these things he is very functional.  He also has coronary artery disease, and atrial fibrillation.  He was diagnosed with clear cell carcinoma of the kidney in 2010 and underwent radiofrequency ablation of the right renal tumor.  He did very well following this until 2016 when he was found to have recurrent disease in the right kidney.  He proceeded with right nephrectomy under the care of Dr. Alinda Money in November 2016.  Of note he did have a complicated recovery  including an infection at the surgical site.  He went on to have reexcision of a recurrence that developed which was resected again by Dr. Alinda Money.  At the time he had significant adhesive disease requiring colectomy and resection of the liver, it did not appear based on pathology that the tumor was involving the liver parenchyma but just abutting it.  He was diagnosed with recurrence in January 2019 and underwent microwave thermal ablation again with [name] to the right aspect of the retroperitoneum.  He recently was found to have progression within a lesion in the right posterior lateral chest wall at the 10th-11th intercostal space which is slowly progressed since April 2018 when it in retrospect measured 10 mm.  That was consistent with concerns for metastatic disease and a biopsy on 02/08/2018 confirmed clear cell renal carcinoma.  He met with Dr. Roxan Hockey to discuss options of resection and was offered this.  He  Did not wish ot proceed with this, and instead met with Dr. Lisbeth Renshaw in February to discuss stereotactic body radiotherapy (SBRT). Due to the amount of time that has passed, he was encouraged to proceed with repeat CT of the chest which was performed on 06/14/18 revealing progression of the lesion in the chest wall at the right 10-11 th ribs measuring 2.8 x 2 cm, compared to 2.1 x 1.5 cm previously. He is contacted by telephone to review the results and recommendations.   PREVIOUS RADIATION THERAPY: Yes   2014: Radioactive seed implant to treat low risk prostate cancer at [place]:  Past Medical History:  Diagnosis Date   Anemia    Arthritis    "back, neck" (07/28/2014)   Blind    both eyes removed    Bruises easily    CAD, NATIVE VESSEL 01/08/2008       Cellulitis late 1980's   "hospitalized; wrapped both legs; several times; no OR for this"   Colon polyps    Diabetic neuropathy ([place])    Dialysis patient ([place])    DM type 2 (diabetes mellitus, type 2)  ([place])    no medications (07/28/2014) diet and excersie controlled not onmeds for 14-15 years    Dysrhythmia    afib    ESRD (end stage renal disease) on dialysis [facility])    [employer]; Mon, Wed, Fri (07/28/2014)   Fall    hx of fall and required left hip surgery    Family history of breast cancer    mother   Heart murmur    hx of   History of blood product transfusion    HYPERLIPIDEMIA-MIXED 01/08/2008   HYPERTENSION 01/27/2009   BP low , hx of HTN, Currently on meds to raise BP   Hypotension    Kidney stones    Low blood pressure    OVERWEIGHT/OBESITY 01/08/2008   Lost 205 lbs through diet and exercise.     Peripheral vascular disease ([place])    vein stripping   Pneumonia 2000;s X 1   Prostate cancer ([place])    Prosthetic eye globe    both eyes   Renal cell carcinoma 2001 and 2003   "both kidneys"   Renal insufficiency        PAST SURGICAL HISTORY: Past Surgical History:  Procedure Laterality Date   AV FISTULA PLACEMENT Left 02/2010   LFA   AV FISTULA PLACEMENT Left 01/12/2016   Procedure: LEFT BRACHIOCEPHALIC ARTERIOVENOUS (AV) FISTULA CREATION;  Surgeon: Angelia Mould, MD;  Location: [place];  Service: Vascular;  Laterality: Left;   [place]   COLONOSCOPY  06/14/2011   Procedure: COLONOSCOPY;  Surgeon: [name], MD;  [facility];  Service: Endoscopy;  Laterality: N/A;   COLONOSCOPY N/A 07/24/2012   Procedure: COLONOSCOPY;  Surgeon: [name], MD;  [facility];  Service: Endoscopy;  Laterality: N/A;   COLONOSCOPY     COLONOSCOPY N/A 11/04/2015   Procedure: COLONOSCOPY;  Surgeon: Manus Gunning, MD;  Location: [facility]COPY;  Service: Gastroenterology;  Laterality: N/A;   COLONOSCOPY WITH PROPOFOL N/A 05/13/2014   Procedure: COLONOSCOPY WITH PROPOFOL;  Surgeon: [name], MD;  [facility];  Service: Endoscopy;  Laterality: N/A;   CORONARY ARTERY BYPASS GRAFT  09/29/2011    Procedure: CORONARY ARTERY BYPASS GRAFTING (CABG);  Surgeon: [name], MD;  Location: [place];  Service: Open Heart Surgery;  Laterality: N/A;  Coronary Artery Bypass Graft times two utilizing the left internal mammary artery and the right greater saphenous vein harvested endoscopically.   CYSTOSCOPY WITH RETROGRADE PYELOGRAM, URETEROSCOPY AND STENT PLACEMENT Left 07/28/2014   Procedure: CYSTOSCOPY WITH RETROGRADE PYELOGRAM, URETERAL BALLOON DILITATION, URETEROSCOPY AND LEFT STENT PLACEMENT;  Surgeon: [name], MD;  [facility];  Service: Urology;  Laterality: Left;   ENUCLEATION  2003; 2006   bilateral; "diabetes; pain"   FOOT AMPUTATION Right ~ 2002   right;  partial; "infection"   FRACTURE SURGERY     hip fx   HIP PINNING,CANNULATED Left 12/31/2013   Procedure: CANNULATED HIP PINNING;  Surgeon: Renette Butters, MD;  Location: [place];  Service: Orthopedics;  Laterality: Left;  [place], FX [place], [employer]   HOT HEMOSTASIS  06/14/2011   Procedure: HOT HEMOSTASIS (ARGON PLASMA COAGULATION/BICAP);  Surgeon: [name], MD;  Location: [facility];  Service: Endoscopy;  Laterality: N/A;   INSERTION OF DIALYSIS CATHETER Right 01/12/2016   Procedure: INSERTION OF DIALYSIS CATHETER;  Surgeon: Angelia Mould, MD;  Location: [place];  Service: Vascular;  Laterality: Right;   INSERTION PROSTATE RADIATION SEED  03/2012   IR GENERIC HISTORICAL  01/13/2016   IR RADIOLOGIST EVAL & MGMT 01/13/2016 Aletta Edouard, MD [facility] INTERV RAD   IR RADIOLOGIST EVAL & MGMT  03/02/2017   IR RADIOLOGIST EVAL & MGMT  05/18/2017   IR RADIOLOGIST EVAL & MGMT  07/27/2017   LAPAROTOMY N/A 04/07/2016   Procedure: EXPLORATORY LAPAROTOMY AND RESECTION OF RETROPERITONEAL MASS;  Surgeon: Raynelle Bring, MD;  [facility];  Service: Urology;  Laterality: N/A;   LEFT HEART CATHETERIZATION WITH CORONARY ANGIOGRAM N/A 09/27/2011   Procedure: LEFT HEART CATHETERIZATION WITH CORONARY ANGIOGRAM;  Surgeon:  [name], MD;  [facility];  Service: Cardiovascular;  Laterality: N/A;   LIGATION OF ARTERIOVENOUS  FISTULA Left 01/12/2016   Procedure: LIGATION OF LEFT RADIOCEPHALIC ARTERIOVENOUS  FISTULA;  Surgeon: Angelia Mould, MD;  Location: [place];  Service: Vascular;  Laterality: Left;   LIGATION OF COMPETING BRANCHES OF ARTERIOVENOUS FISTULA Left 07/29/2014   Procedure: LIGATION OF COMPETING BRANCHES OF LEFT ARM ARTERIOVENOUS FISTULA;  Surgeon: [name], MD;  Location: [place];  Service: Vascular;  Laterality: Left;   NEPHRECTOMY Right 01/30/2015   done at [place] Right 04/07/2016   Procedure: RIGHT COLECTOMY AND PARTIAL LIVER RESECTION;  Surgeon: Raynelle Bring, MD;  [facility];  Service: Urology;  Laterality: Right;   RADIOFREQUENCY ABLATION N/A 04/19/2017   Procedure: CT MICROWAVE THERMAL ABLATION;  Surgeon: Aletta Edouard, MD;  [facility];  Service: Anesthesiology;  Laterality: N/A;   [place] [facility]  2010-2012   "twice; one on each side; for cancer"   [place] Left 09/01/2015   Procedure: EXPLORATION LEFT LOWER ARM  RADIOCEPHALIC ARTERIOVENOUS FISTULA;  Surgeon: Conrad Rockland, MD;  Location: [place];  Service: Vascular;  Laterality: Left;   VARICOSE VEIN SURGERY  mid 1980's    BLE; "knees down; both legs; 2 separate times"     FAMILY HISTORY:  Family History  Problem Relation Age of Onset   Cancer Mother 47       breast, spine and ovarian   Heart disease Mother    Hyperlipidemia Mother    Hypertension Mother    Varicose Veins Mother    Emphysema Father    Cancer Father 44       kidney, prostate   Heart disease Father    Hyperlipidemia Father    Hypertension Father    Cancer Sister        ovarian   Cancer Brother        lung   Heart disease Brother    Hyperlipidemia Brother    Hypertension Brother    Heart attack Brother    Peripheral vascular disease  Brother    Cancer Other    Coronary artery disease Other    Kidney disease Other    Breast cancer Other    Ovarian cancer Other    Lung cancer Other    Diabetes Other    Malignant hyperthermia Neg Hx      SOCIAL  HISTORY:  reports that he has never smoked. He has never used smokeless tobacco. He reports that he does not drink alcohol or use drugs. The patient   ALLERGIES: Codeine and Tape   MEDICATIONS:  Current Outpatient Medications  Medication Sig Dispense Refill   aspirin EC 81 MG tablet Take 81 mg by mouth at bedtime.     atorvastatin (LIPITOR) 10 MG tablet Take 10 mg by mouth daily.     Methoxy PEG-Epoetin Beta (MIRCERA) 50 MCG/0.3ML SOSY Inject 225 mcg as directed every 14 (fourteen) days.      midodrine (PROAMATINE) 10 MG tablet Take 1 tablet (10 mg total) by mouth 3 (three) times daily with meals. 90 tablet 0   oseltamivir (TAMIFLU) 75 MG capsule TAKE 1 CAPSULE BY MOUTH TWICE A DAY FOR 5 DAYS.     Probiotic Product (ALIGN) 4 MG CAPS Take 1 capsule by mouth daily.     sevelamer carbonate (RENVELA) 800 MG tablet Take 2,400-4,800 mg by mouth See admin instructions. Take 6 tablets (4800 mg) by mouth 3 times daily with meals and 3 tablets (2400 mg) 2 times daily with snacks     dextromethorphan-guaiFENesin (MUCINEX DM) 30-600 MG 12hr tablet Take 1 tablet by mouth 2 (two) times daily. (Patient not taking: Reported on 07/10/2018) 30 tablet 1   multivitamin (RENA-VIT) TABS tablet Take 1 tablet by mouth daily.     pantoprazole (PROTONIX) 40 MG tablet Take 1 tablet (40 mg total) by mouth daily before breakfast. (Patient not taking: Reported on 05/29/2018) 30 tablet 3   No current facility-administered medications for this encounter.      REVIEW OF SYSTEMS: On review of systems, the patient reports that he is doing well overall. He denies any chest pain, shortness of breath, cough, fevers, chills, night sweats, unintended weight changes. He denies any bowel or bladder  disturbances, and denies abdominal pain, nausea or vomiting. He denies any new musculoskeletal or joint aches or pains. A complete review of systems is obtained and is otherwise negative.     PHYSICAL EXAM:  Unable to perform due to the type of encounter  ECOG = 0  0 - Asymptomatic (Fully active, able to carry on all predisease activities without restriction)  1 - Symptomatic but completely ambulatory (Restricted in physically strenuous activity but ambulatory and able to carry out work of a light or sedentary nature. For example, light housework, office work)  2 - Symptomatic, <50% in bed during the day (Ambulatory and capable of all self care but unable to carry out any work activities. Up and about more than 50% of waking hours)  3 - Symptomatic, >50% in bed, but not bedbound (Capable of only limited self-care, confined to bed or chair 50% or more of waking hours)  4 - Bedbound (Completely disabled. Cannot carry on any self-care. Totally confined to bed or chair)  5 - Death   Eustace Pen MM, Creech RH, Tormey DC, et al. [phone]). "Toxicity and response criteria of the [facility]". [place] Oncol. 5 (6): 649-55    LABORATORY DATA:  Lab Results  Component Value Date   WBC 3.3 (L) 03/23/2018   HGB 9.1 (L) 03/23/2018   HCT 27.9 (L) 03/23/2018   MCV 104.5 (H) 03/23/2018   PLT 81 (L) 03/23/2018   Lab Results  Component Value Date   NA 139 03/22/2018   K 4.1 03/22/2018   CL 97 (L) 03/22/2018   CO2 29 03/22/2018   Lab Results  Component  Value Date   ALT 23 03/17/2018   AST 21 03/17/2018   ALKPHOS 96 03/17/2018   BILITOT 0.9 03/17/2018      RADIOGRAPHY: Ct Abdomen Pelvis Wo Contrast  Result Date: 06/14/2018 CLINICAL DATA:  Right nephrectomy for renal cell carcinoma in 2009. Left renal ablation in 2011. Prostate cancer in 2014. Cholecystectomy. CABG. EXAM: CT CHEST, ABDOMEN AND PELVIS WITHOUT CONTRAST TECHNIQUE: Multidetector CT imaging of the chest,  abdomen and pelvis was performed following the standard protocol without IV contrast. COMPARISON:  Chest radiograph 03/20/2018.  Prior CTs of 12/19/2017. FINDINGS: CT CHEST FINDINGS Cardiovascular: Aortic atherosclerosis. Tortuous thoracic aorta. Mild cardiomegaly, with median sternotomy for CABG. Mediastinum/Nodes: No supraclavicular adenopathy. No mediastinal or definite hilar adenopathy, given limitations of unenhanced CT. Lungs/Pleura: No pleural fluid. Presumed secretions within the dependent trachea. Right lower lobe bronchial wall thickening with peribronchovascular interstitial thickening. Felt to be increased compared to 12/19/2017. Musculoskeletal: The soft tissue implant between the right tenth and 11 ribs measures 2.8 x 2.0 cm on image 68/2. Compare 2.1 x 1.5 cm on the prior exam. No acute osseous abnormality. CT ABDOMEN PELVIS FINDINGS Hepatobiliary: Normal liver. Cholecystectomy, without biliary ductal dilatation. Pancreas: Pancreatic atrophy, without duct dilatation or dominant mass. Spleen: Splenomegaly at 16.8 cm craniocaudal. Adrenals/Urinary Tract: Normal right adrenal gland. Mild left adrenal thickening. Left renal cortical thinning thick with multiple too small to characterize low-density lesions. 2.5 cm renal sinus cyst. Renal vascular calcifications with a probable 4 mm lower pole left renal collecting system calculus. Right nephrectomy with similar relatively linear soft tissue density in the operative bed, including on image 79/2. Normal urinary bladder. Stomach/Bowel: Normal stomach, without wall thickening. Periampullary duodenal diverticulum. Otherwise normal small bowel. Colonic stool burden suggests constipation. Partial right hemicolectomy. Vascular/Lymphatic: Advanced aortic and branch vessel atherosclerosis. No abdominopelvic adenopathy. Reproductive: Radiation seeds in the prostate. Other: No significant free fluid. Musculoskeletal: Osteopenia. Left proximal femur fixation. Probable  bone island in the left sacrum. Suspicion of ankylosing spondylitis throughout the thoracolumbar spine. IMPRESSION: 1. Enlargement of a soft tissue implant within the right 10/11th intercostal space. This consistent with progressive metastasis. 2. No other evidence of metastatic disease within the chest, abdomen, or pelvis. 3. Right lower lobe bronchial wall thickening with increased peribronchovascular interstitial thickening. Suspicious for interval infection or aspiration of indeterminate acuity. 4. Aortic atherosclerosis (ICD10-I70.0) and emphysema (ICD10-J43.9). 5.  Possible constipation. Electronically Signed   By: Abigail Miyamoto M.D.   On: 06/14/2018 17:17   Ct Chest Wo Contrast  Result Date: 06/14/2018 CLINICAL DATA:  Right nephrectomy for renal cell carcinoma in 2009. Left renal ablation in 2011. Prostate cancer in 2014. Cholecystectomy. CABG. EXAM: CT CHEST, ABDOMEN AND PELVIS WITHOUT CONTRAST TECHNIQUE: Multidetector CT imaging of the chest, abdomen and pelvis was performed following the standard protocol without IV contrast. COMPARISON:  Chest radiograph 03/20/2018.  Prior CTs of 12/19/2017. FINDINGS: CT CHEST FINDINGS Cardiovascular: Aortic atherosclerosis. Tortuous thoracic aorta. Mild cardiomegaly, with median sternotomy for CABG. Mediastinum/Nodes: No supraclavicular adenopathy. No mediastinal or definite hilar adenopathy, given limitations of unenhanced CT. Lungs/Pleura: No pleural fluid. Presumed secretions within the dependent trachea. Right lower lobe bronchial wall thickening with peribronchovascular interstitial thickening. Felt to be increased compared to 12/19/2017. Musculoskeletal: The soft tissue implant between the right tenth and 11 ribs measures 2.8 x 2.0 cm on image 68/2. Compare 2.1 x 1.5 cm on the prior exam. No acute osseous abnormality. CT ABDOMEN PELVIS FINDINGS Hepatobiliary: Normal liver. Cholecystectomy, without biliary ductal dilatation. Pancreas: Pancreatic atrophy, without  duct  dilatation or dominant mass. Spleen: Splenomegaly at 16.8 cm craniocaudal. Adrenals/Urinary Tract: Normal right adrenal gland. Mild left adrenal thickening. Left renal cortical thinning thick with multiple too small to characterize low-density lesions. 2.5 cm renal sinus cyst. Renal vascular calcifications with a probable 4 mm lower pole left renal collecting system calculus. Right nephrectomy with similar relatively linear soft tissue density in the operative bed, including on image 79/2. Normal urinary bladder. Stomach/Bowel: Normal stomach, without wall thickening. Periampullary duodenal diverticulum. Otherwise normal small bowel. Colonic stool burden suggests constipation. Partial right hemicolectomy. Vascular/Lymphatic: Advanced aortic and branch vessel atherosclerosis. No abdominopelvic adenopathy. Reproductive: Radiation seeds in the prostate. Other: No significant free fluid. Musculoskeletal: Osteopenia. Left proximal femur fixation. Probable bone island in the left sacrum. Suspicion of ankylosing spondylitis throughout the thoracolumbar spine. IMPRESSION: 1. Enlargement of a soft tissue implant within the right 10/11th intercostal space. This consistent with progressive metastasis. 2. No other evidence of metastatic disease within the chest, abdomen, or pelvis. 3. Right lower lobe bronchial wall thickening with increased peribronchovascular interstitial thickening. Suspicious for interval infection or aspiration of indeterminate acuity. 4. Aortic atherosclerosis (ICD10-I70.0) and emphysema (ICD10-J43.9). 5.  Possible constipation. Electronically Signed   By: Abigail Miyamoto M.D.   On: 06/14/2018 17:17       IMPRESSION/PLAN: 1.         Recurrent clear cell carcinoma of the kidney with local recurrence to the chest wall. Dr. Lisbeth Renshaw has reviewed [name] course and his recent CT imaging. He remains a candidate for SBRT to the chest wall metastasis. We discussed the risks, benefits, short, and long  term effects of radiotherapy, and the patient is interested in proceeding. We discussed the delivery and logistics of radiotherapy and anticipates a course of stereotactic body radiotherapy over 5 fractions. He will have simulation the week of 07/23/2018 given the concerns for COVID 19 pandemic. We will coordinate his treatments to occur on T/Th given his dialysis schedule. 2.         ESRD on HD. The patient will continue Dialysis per [facility] MWF, and we will coordinate treatments to try to avoid those days. 3.         Low risk adenocarcinoma of the prostate. The patient continues to be followed by Dr. Alinda Money and is clinically NED by stable PSA.    Given current concerns for patient exposure during the COVID-19 pandemic, this encounter was conducted via telephone.  The patient has given verbal consent for this type of encounter. The time spent during this encounter was 25 minutes and 50% of that time was spent in the coordination of his care. The attendants for this meeting include Shona Simpson, [facility] and Bradly Bienenstock [place]  During the encounter, Shona Simpson [facility] waslocated at [facility] Radiation Oncology Department.  [name]  was located at [facility], an assisted living facility he resides at.     Carola Rhine, PAC

## 2018-07-11 ENCOUNTER — Other Ambulatory Visit: Payer: Self-pay | Admitting: Physician Assistant

## 2018-07-11 DIAGNOSIS — E119 Type 2 diabetes mellitus without complications: Secondary | ICD-10-CM | POA: Diagnosis not present

## 2018-07-11 DIAGNOSIS — D631 Anemia in chronic kidney disease: Secondary | ICD-10-CM | POA: Diagnosis not present

## 2018-07-11 DIAGNOSIS — E1129 Type 2 diabetes mellitus with other diabetic kidney complication: Secondary | ICD-10-CM | POA: Diagnosis not present

## 2018-07-11 DIAGNOSIS — Z20822 Contact with and (suspected) exposure to covid-19: Secondary | ICD-10-CM

## 2018-07-11 DIAGNOSIS — D509 Iron deficiency anemia, unspecified: Secondary | ICD-10-CM | POA: Diagnosis not present

## 2018-07-11 DIAGNOSIS — K7689 Other specified diseases of liver: Secondary | ICD-10-CM | POA: Diagnosis not present

## 2018-07-11 DIAGNOSIS — N186 End stage renal disease: Secondary | ICD-10-CM | POA: Diagnosis not present

## 2018-07-11 DIAGNOSIS — N2581 Secondary hyperparathyroidism of renal origin: Secondary | ICD-10-CM | POA: Diagnosis not present

## 2018-07-11 DIAGNOSIS — Z20828 Contact with and (suspected) exposure to other viral communicable diseases: Secondary | ICD-10-CM

## 2018-07-12 ENCOUNTER — Ambulatory Visit: Payer: Medicare Other | Admitting: Radiation Oncology

## 2018-07-13 DIAGNOSIS — E119 Type 2 diabetes mellitus without complications: Secondary | ICD-10-CM | POA: Diagnosis not present

## 2018-07-13 DIAGNOSIS — N2581 Secondary hyperparathyroidism of renal origin: Secondary | ICD-10-CM | POA: Diagnosis not present

## 2018-07-13 DIAGNOSIS — K7689 Other specified diseases of liver: Secondary | ICD-10-CM | POA: Diagnosis not present

## 2018-07-13 DIAGNOSIS — D509 Iron deficiency anemia, unspecified: Secondary | ICD-10-CM | POA: Diagnosis not present

## 2018-07-13 DIAGNOSIS — D631 Anemia in chronic kidney disease: Secondary | ICD-10-CM | POA: Diagnosis not present

## 2018-07-13 DIAGNOSIS — N186 End stage renal disease: Secondary | ICD-10-CM | POA: Diagnosis not present

## 2018-07-16 DIAGNOSIS — N2581 Secondary hyperparathyroidism of renal origin: Secondary | ICD-10-CM | POA: Diagnosis not present

## 2018-07-16 DIAGNOSIS — D509 Iron deficiency anemia, unspecified: Secondary | ICD-10-CM | POA: Diagnosis not present

## 2018-07-16 DIAGNOSIS — N186 End stage renal disease: Secondary | ICD-10-CM | POA: Diagnosis not present

## 2018-07-16 DIAGNOSIS — D631 Anemia in chronic kidney disease: Secondary | ICD-10-CM | POA: Diagnosis not present

## 2018-07-16 DIAGNOSIS — E119 Type 2 diabetes mellitus without complications: Secondary | ICD-10-CM | POA: Diagnosis not present

## 2018-07-16 DIAGNOSIS — K7689 Other specified diseases of liver: Secondary | ICD-10-CM | POA: Diagnosis not present

## 2018-07-18 DIAGNOSIS — K7689 Other specified diseases of liver: Secondary | ICD-10-CM | POA: Diagnosis not present

## 2018-07-18 DIAGNOSIS — D631 Anemia in chronic kidney disease: Secondary | ICD-10-CM | POA: Diagnosis not present

## 2018-07-18 DIAGNOSIS — D509 Iron deficiency anemia, unspecified: Secondary | ICD-10-CM | POA: Diagnosis not present

## 2018-07-18 DIAGNOSIS — N2581 Secondary hyperparathyroidism of renal origin: Secondary | ICD-10-CM | POA: Diagnosis not present

## 2018-07-18 DIAGNOSIS — N186 End stage renal disease: Secondary | ICD-10-CM | POA: Diagnosis not present

## 2018-07-18 DIAGNOSIS — E119 Type 2 diabetes mellitus without complications: Secondary | ICD-10-CM | POA: Diagnosis not present

## 2018-07-20 DIAGNOSIS — E119 Type 2 diabetes mellitus without complications: Secondary | ICD-10-CM | POA: Diagnosis not present

## 2018-07-20 DIAGNOSIS — D509 Iron deficiency anemia, unspecified: Secondary | ICD-10-CM | POA: Diagnosis not present

## 2018-07-20 DIAGNOSIS — N186 End stage renal disease: Secondary | ICD-10-CM | POA: Diagnosis not present

## 2018-07-20 DIAGNOSIS — K7689 Other specified diseases of liver: Secondary | ICD-10-CM | POA: Diagnosis not present

## 2018-07-20 DIAGNOSIS — N2581 Secondary hyperparathyroidism of renal origin: Secondary | ICD-10-CM | POA: Diagnosis not present

## 2018-07-20 DIAGNOSIS — D631 Anemia in chronic kidney disease: Secondary | ICD-10-CM | POA: Diagnosis not present

## 2018-07-23 DIAGNOSIS — K7689 Other specified diseases of liver: Secondary | ICD-10-CM | POA: Diagnosis not present

## 2018-07-23 DIAGNOSIS — N2581 Secondary hyperparathyroidism of renal origin: Secondary | ICD-10-CM | POA: Diagnosis not present

## 2018-07-23 DIAGNOSIS — E119 Type 2 diabetes mellitus without complications: Secondary | ICD-10-CM | POA: Diagnosis not present

## 2018-07-23 DIAGNOSIS — D631 Anemia in chronic kidney disease: Secondary | ICD-10-CM | POA: Diagnosis not present

## 2018-07-23 DIAGNOSIS — D509 Iron deficiency anemia, unspecified: Secondary | ICD-10-CM | POA: Diagnosis not present

## 2018-07-23 DIAGNOSIS — N186 End stage renal disease: Secondary | ICD-10-CM | POA: Diagnosis not present

## 2018-07-24 ENCOUNTER — Ambulatory Visit: Payer: Medicare Other | Admitting: Radiation Oncology

## 2018-07-25 DIAGNOSIS — D631 Anemia in chronic kidney disease: Secondary | ICD-10-CM | POA: Diagnosis not present

## 2018-07-25 DIAGNOSIS — K7689 Other specified diseases of liver: Secondary | ICD-10-CM | POA: Diagnosis not present

## 2018-07-25 DIAGNOSIS — D509 Iron deficiency anemia, unspecified: Secondary | ICD-10-CM | POA: Diagnosis not present

## 2018-07-25 DIAGNOSIS — N2581 Secondary hyperparathyroidism of renal origin: Secondary | ICD-10-CM | POA: Diagnosis not present

## 2018-07-25 DIAGNOSIS — N186 End stage renal disease: Secondary | ICD-10-CM | POA: Diagnosis not present

## 2018-07-25 DIAGNOSIS — E119 Type 2 diabetes mellitus without complications: Secondary | ICD-10-CM | POA: Diagnosis not present

## 2018-07-27 DIAGNOSIS — N2581 Secondary hyperparathyroidism of renal origin: Secondary | ICD-10-CM | POA: Diagnosis not present

## 2018-07-27 DIAGNOSIS — K7689 Other specified diseases of liver: Secondary | ICD-10-CM | POA: Diagnosis not present

## 2018-07-27 DIAGNOSIS — N186 End stage renal disease: Secondary | ICD-10-CM | POA: Diagnosis not present

## 2018-07-27 DIAGNOSIS — D631 Anemia in chronic kidney disease: Secondary | ICD-10-CM | POA: Diagnosis not present

## 2018-07-27 DIAGNOSIS — D509 Iron deficiency anemia, unspecified: Secondary | ICD-10-CM | POA: Diagnosis not present

## 2018-07-27 DIAGNOSIS — E119 Type 2 diabetes mellitus without complications: Secondary | ICD-10-CM | POA: Diagnosis not present

## 2018-07-30 DIAGNOSIS — E119 Type 2 diabetes mellitus without complications: Secondary | ICD-10-CM | POA: Diagnosis not present

## 2018-07-30 DIAGNOSIS — D509 Iron deficiency anemia, unspecified: Secondary | ICD-10-CM | POA: Diagnosis not present

## 2018-07-30 DIAGNOSIS — D631 Anemia in chronic kidney disease: Secondary | ICD-10-CM | POA: Diagnosis not present

## 2018-07-30 DIAGNOSIS — N186 End stage renal disease: Secondary | ICD-10-CM | POA: Diagnosis not present

## 2018-07-30 DIAGNOSIS — K7689 Other specified diseases of liver: Secondary | ICD-10-CM | POA: Diagnosis not present

## 2018-07-30 DIAGNOSIS — N2581 Secondary hyperparathyroidism of renal origin: Secondary | ICD-10-CM | POA: Diagnosis not present

## 2018-07-31 ENCOUNTER — Ambulatory Visit: Payer: Medicare Other | Admitting: Radiation Oncology

## 2018-08-01 DIAGNOSIS — N186 End stage renal disease: Secondary | ICD-10-CM | POA: Diagnosis not present

## 2018-08-01 DIAGNOSIS — N2581 Secondary hyperparathyroidism of renal origin: Secondary | ICD-10-CM | POA: Diagnosis not present

## 2018-08-01 DIAGNOSIS — E119 Type 2 diabetes mellitus without complications: Secondary | ICD-10-CM | POA: Diagnosis not present

## 2018-08-01 DIAGNOSIS — K7689 Other specified diseases of liver: Secondary | ICD-10-CM | POA: Diagnosis not present

## 2018-08-01 DIAGNOSIS — D509 Iron deficiency anemia, unspecified: Secondary | ICD-10-CM | POA: Diagnosis not present

## 2018-08-01 DIAGNOSIS — D631 Anemia in chronic kidney disease: Secondary | ICD-10-CM | POA: Diagnosis not present

## 2018-08-02 ENCOUNTER — Ambulatory Visit: Payer: Medicare Other | Admitting: Radiation Oncology

## 2018-08-03 DIAGNOSIS — K7689 Other specified diseases of liver: Secondary | ICD-10-CM | POA: Diagnosis not present

## 2018-08-03 DIAGNOSIS — N2581 Secondary hyperparathyroidism of renal origin: Secondary | ICD-10-CM | POA: Diagnosis not present

## 2018-08-03 DIAGNOSIS — Z992 Dependence on renal dialysis: Secondary | ICD-10-CM | POA: Diagnosis not present

## 2018-08-03 DIAGNOSIS — E1129 Type 2 diabetes mellitus with other diabetic kidney complication: Secondary | ICD-10-CM | POA: Diagnosis not present

## 2018-08-03 DIAGNOSIS — E119 Type 2 diabetes mellitus without complications: Secondary | ICD-10-CM | POA: Diagnosis not present

## 2018-08-03 DIAGNOSIS — D509 Iron deficiency anemia, unspecified: Secondary | ICD-10-CM | POA: Diagnosis not present

## 2018-08-03 DIAGNOSIS — D631 Anemia in chronic kidney disease: Secondary | ICD-10-CM | POA: Diagnosis not present

## 2018-08-03 DIAGNOSIS — N186 End stage renal disease: Secondary | ICD-10-CM | POA: Diagnosis not present

## 2018-08-06 DIAGNOSIS — N186 End stage renal disease: Secondary | ICD-10-CM | POA: Diagnosis not present

## 2018-08-06 DIAGNOSIS — N2581 Secondary hyperparathyroidism of renal origin: Secondary | ICD-10-CM | POA: Diagnosis not present

## 2018-08-06 DIAGNOSIS — D631 Anemia in chronic kidney disease: Secondary | ICD-10-CM | POA: Diagnosis not present

## 2018-08-06 DIAGNOSIS — D509 Iron deficiency anemia, unspecified: Secondary | ICD-10-CM | POA: Diagnosis not present

## 2018-08-06 DIAGNOSIS — K7689 Other specified diseases of liver: Secondary | ICD-10-CM | POA: Diagnosis not present

## 2018-08-06 DIAGNOSIS — E119 Type 2 diabetes mellitus without complications: Secondary | ICD-10-CM | POA: Diagnosis not present

## 2018-08-07 ENCOUNTER — Ambulatory Visit: Payer: Medicare Other | Admitting: Radiation Oncology

## 2018-08-07 ENCOUNTER — Ambulatory Visit: Payer: Medicare Other | Admitting: Podiatry

## 2018-08-08 DIAGNOSIS — E119 Type 2 diabetes mellitus without complications: Secondary | ICD-10-CM | POA: Diagnosis not present

## 2018-08-08 DIAGNOSIS — K7689 Other specified diseases of liver: Secondary | ICD-10-CM | POA: Diagnosis not present

## 2018-08-08 DIAGNOSIS — D631 Anemia in chronic kidney disease: Secondary | ICD-10-CM | POA: Diagnosis not present

## 2018-08-08 DIAGNOSIS — N2581 Secondary hyperparathyroidism of renal origin: Secondary | ICD-10-CM | POA: Diagnosis not present

## 2018-08-08 DIAGNOSIS — N186 End stage renal disease: Secondary | ICD-10-CM | POA: Diagnosis not present

## 2018-08-08 DIAGNOSIS — D509 Iron deficiency anemia, unspecified: Secondary | ICD-10-CM | POA: Diagnosis not present

## 2018-08-09 ENCOUNTER — Ambulatory Visit: Payer: Medicare Other | Admitting: Radiation Oncology

## 2018-08-10 DIAGNOSIS — E119 Type 2 diabetes mellitus without complications: Secondary | ICD-10-CM | POA: Diagnosis not present

## 2018-08-10 DIAGNOSIS — N186 End stage renal disease: Secondary | ICD-10-CM | POA: Diagnosis not present

## 2018-08-10 DIAGNOSIS — K7689 Other specified diseases of liver: Secondary | ICD-10-CM | POA: Diagnosis not present

## 2018-08-10 DIAGNOSIS — N2581 Secondary hyperparathyroidism of renal origin: Secondary | ICD-10-CM | POA: Diagnosis not present

## 2018-08-10 DIAGNOSIS — D631 Anemia in chronic kidney disease: Secondary | ICD-10-CM | POA: Diagnosis not present

## 2018-08-10 DIAGNOSIS — D509 Iron deficiency anemia, unspecified: Secondary | ICD-10-CM | POA: Diagnosis not present

## 2018-08-13 DIAGNOSIS — E119 Type 2 diabetes mellitus without complications: Secondary | ICD-10-CM | POA: Diagnosis not present

## 2018-08-13 DIAGNOSIS — N2581 Secondary hyperparathyroidism of renal origin: Secondary | ICD-10-CM | POA: Diagnosis not present

## 2018-08-13 DIAGNOSIS — N186 End stage renal disease: Secondary | ICD-10-CM | POA: Diagnosis not present

## 2018-08-13 DIAGNOSIS — K7689 Other specified diseases of liver: Secondary | ICD-10-CM | POA: Diagnosis not present

## 2018-08-13 DIAGNOSIS — D631 Anemia in chronic kidney disease: Secondary | ICD-10-CM | POA: Diagnosis not present

## 2018-08-13 DIAGNOSIS — D509 Iron deficiency anemia, unspecified: Secondary | ICD-10-CM | POA: Diagnosis not present

## 2018-08-14 ENCOUNTER — Ambulatory Visit: Payer: Medicare Other | Admitting: Radiation Oncology

## 2018-08-15 DIAGNOSIS — E119 Type 2 diabetes mellitus without complications: Secondary | ICD-10-CM | POA: Diagnosis not present

## 2018-08-15 DIAGNOSIS — K7689 Other specified diseases of liver: Secondary | ICD-10-CM | POA: Diagnosis not present

## 2018-08-15 DIAGNOSIS — D631 Anemia in chronic kidney disease: Secondary | ICD-10-CM | POA: Diagnosis not present

## 2018-08-15 DIAGNOSIS — N186 End stage renal disease: Secondary | ICD-10-CM | POA: Diagnosis not present

## 2018-08-15 DIAGNOSIS — D509 Iron deficiency anemia, unspecified: Secondary | ICD-10-CM | POA: Diagnosis not present

## 2018-08-15 DIAGNOSIS — N2581 Secondary hyperparathyroidism of renal origin: Secondary | ICD-10-CM | POA: Diagnosis not present

## 2018-08-16 ENCOUNTER — Encounter: Payer: Self-pay | Admitting: Radiation Oncology

## 2018-08-16 ENCOUNTER — Other Ambulatory Visit: Payer: Self-pay

## 2018-08-16 ENCOUNTER — Ambulatory Visit
Admission: RE | Admit: 2018-08-16 | Discharge: 2018-08-16 | Disposition: A | Discharge status: Home / Self Care | Payer: Medicare Other | Source: Ambulatory Visit | Attending: Radiation Oncology | Admitting: Radiation Oncology

## 2018-08-16 DIAGNOSIS — C771 Secondary and unspecified malignant neoplasm of intrathoracic lymph nodes: Secondary | ICD-10-CM | POA: Diagnosis not present

## 2018-08-16 DIAGNOSIS — C641 Malignant neoplasm of right kidney, except renal pelvis: Secondary | ICD-10-CM | POA: Diagnosis not present

## 2018-08-16 DIAGNOSIS — C7989 Secondary malignant neoplasm of other specified sites: Secondary | ICD-10-CM | POA: Insufficient documentation

## 2018-08-16 DIAGNOSIS — Z510 Encounter for antineoplastic radiation therapy: Secondary | ICD-10-CM | POA: Insufficient documentation

## 2018-08-16 NOTE — Progress Notes (Signed)
I called and LM for the nursing director at [facility]. [name]'s wife unfortunately had Covid and passed from complications of the disease. He has not been around her since early March, and has not been exposed. It was felt that her diagnosis was made after hospitalization and rehab facility exposure (different facility than [facility]). I spoke with the ordering provider of his covid testing and they decided to forgo the testing once they realized he had not been exposed to his wife and because he was asymptomatic.

## 2018-08-17 DIAGNOSIS — K7689 Other specified diseases of liver: Secondary | ICD-10-CM | POA: Diagnosis not present

## 2018-08-17 DIAGNOSIS — N186 End stage renal disease: Secondary | ICD-10-CM | POA: Diagnosis not present

## 2018-08-17 DIAGNOSIS — E119 Type 2 diabetes mellitus without complications: Secondary | ICD-10-CM | POA: Diagnosis not present

## 2018-08-17 DIAGNOSIS — N2581 Secondary hyperparathyroidism of renal origin: Secondary | ICD-10-CM | POA: Diagnosis not present

## 2018-08-17 DIAGNOSIS — D509 Iron deficiency anemia, unspecified: Secondary | ICD-10-CM | POA: Diagnosis not present

## 2018-08-17 DIAGNOSIS — D631 Anemia in chronic kidney disease: Secondary | ICD-10-CM | POA: Diagnosis not present

## 2018-08-20 DIAGNOSIS — K7689 Other specified diseases of liver: Secondary | ICD-10-CM | POA: Diagnosis not present

## 2018-08-20 DIAGNOSIS — D631 Anemia in chronic kidney disease: Secondary | ICD-10-CM | POA: Diagnosis not present

## 2018-08-20 DIAGNOSIS — E119 Type 2 diabetes mellitus without complications: Secondary | ICD-10-CM | POA: Diagnosis not present

## 2018-08-20 DIAGNOSIS — N2581 Secondary hyperparathyroidism of renal origin: Secondary | ICD-10-CM | POA: Diagnosis not present

## 2018-08-20 DIAGNOSIS — N186 End stage renal disease: Secondary | ICD-10-CM | POA: Diagnosis not present

## 2018-08-20 DIAGNOSIS — D509 Iron deficiency anemia, unspecified: Secondary | ICD-10-CM | POA: Diagnosis not present

## 2018-08-21 ENCOUNTER — Ambulatory Visit: Payer: Medicare Other | Admitting: Radiation Oncology

## 2018-08-22 DIAGNOSIS — C641 Malignant neoplasm of right kidney, except renal pelvis: Secondary | ICD-10-CM | POA: Diagnosis not present

## 2018-08-22 DIAGNOSIS — K7689 Other specified diseases of liver: Secondary | ICD-10-CM | POA: Diagnosis not present

## 2018-08-22 DIAGNOSIS — D509 Iron deficiency anemia, unspecified: Secondary | ICD-10-CM | POA: Diagnosis not present

## 2018-08-22 DIAGNOSIS — E119 Type 2 diabetes mellitus without complications: Secondary | ICD-10-CM | POA: Diagnosis not present

## 2018-08-22 DIAGNOSIS — N186 End stage renal disease: Secondary | ICD-10-CM | POA: Diagnosis not present

## 2018-08-22 DIAGNOSIS — N2581 Secondary hyperparathyroidism of renal origin: Secondary | ICD-10-CM | POA: Diagnosis not present

## 2018-08-22 DIAGNOSIS — C7989 Secondary malignant neoplasm of other specified sites: Secondary | ICD-10-CM | POA: Diagnosis not present

## 2018-08-22 DIAGNOSIS — D631 Anemia in chronic kidney disease: Secondary | ICD-10-CM | POA: Diagnosis not present

## 2018-08-22 DIAGNOSIS — C771 Secondary and unspecified malignant neoplasm of intrathoracic lymph nodes: Secondary | ICD-10-CM | POA: Diagnosis not present

## 2018-08-22 DIAGNOSIS — Z510 Encounter for antineoplastic radiation therapy: Secondary | ICD-10-CM | POA: Diagnosis not present

## 2018-08-23 ENCOUNTER — Ambulatory Visit
Admission: RE | Admit: 2018-08-23 | Discharge: 2018-08-23 | Disposition: A | Discharge status: Home / Self Care | Payer: Medicare Other | Source: Ambulatory Visit | Attending: Radiation Oncology | Admitting: Radiation Oncology

## 2018-08-23 ENCOUNTER — Other Ambulatory Visit: Payer: Self-pay

## 2018-08-23 DIAGNOSIS — C7989 Secondary malignant neoplasm of other specified sites: Secondary | ICD-10-CM | POA: Diagnosis not present

## 2018-08-23 DIAGNOSIS — C771 Secondary and unspecified malignant neoplasm of intrathoracic lymph nodes: Secondary | ICD-10-CM | POA: Diagnosis not present

## 2018-08-23 DIAGNOSIS — Z510 Encounter for antineoplastic radiation therapy: Secondary | ICD-10-CM | POA: Diagnosis not present

## 2018-08-23 DIAGNOSIS — C641 Malignant neoplasm of right kidney, except renal pelvis: Secondary | ICD-10-CM | POA: Diagnosis not present

## 2018-08-24 ENCOUNTER — Other Ambulatory Visit: Payer: Self-pay

## 2018-08-24 ENCOUNTER — Ambulatory Visit
Admission: RE | Admit: 2018-08-24 | Discharge: 2018-08-24 | Disposition: A | Discharge status: Home / Self Care | Payer: Medicare Other | Source: Ambulatory Visit | Attending: Radiation Oncology | Admitting: Radiation Oncology

## 2018-08-24 DIAGNOSIS — C7989 Secondary malignant neoplasm of other specified sites: Secondary | ICD-10-CM | POA: Diagnosis not present

## 2018-08-24 DIAGNOSIS — E119 Type 2 diabetes mellitus without complications: Secondary | ICD-10-CM | POA: Diagnosis not present

## 2018-08-24 DIAGNOSIS — N2581 Secondary hyperparathyroidism of renal origin: Secondary | ICD-10-CM | POA: Diagnosis not present

## 2018-08-24 DIAGNOSIS — D631 Anemia in chronic kidney disease: Secondary | ICD-10-CM | POA: Diagnosis not present

## 2018-08-24 DIAGNOSIS — N186 End stage renal disease: Secondary | ICD-10-CM | POA: Diagnosis not present

## 2018-08-24 DIAGNOSIS — C771 Secondary and unspecified malignant neoplasm of intrathoracic lymph nodes: Secondary | ICD-10-CM | POA: Diagnosis not present

## 2018-08-24 DIAGNOSIS — D509 Iron deficiency anemia, unspecified: Secondary | ICD-10-CM | POA: Diagnosis not present

## 2018-08-24 DIAGNOSIS — K7689 Other specified diseases of liver: Secondary | ICD-10-CM | POA: Diagnosis not present

## 2018-08-24 DIAGNOSIS — C641 Malignant neoplasm of right kidney, except renal pelvis: Secondary | ICD-10-CM | POA: Diagnosis not present

## 2018-08-24 DIAGNOSIS — Z510 Encounter for antineoplastic radiation therapy: Secondary | ICD-10-CM | POA: Diagnosis not present

## 2018-08-27 DIAGNOSIS — N186 End stage renal disease: Secondary | ICD-10-CM | POA: Diagnosis not present

## 2018-08-27 DIAGNOSIS — D631 Anemia in chronic kidney disease: Secondary | ICD-10-CM | POA: Diagnosis not present

## 2018-08-27 DIAGNOSIS — N2581 Secondary hyperparathyroidism of renal origin: Secondary | ICD-10-CM | POA: Diagnosis not present

## 2018-08-27 DIAGNOSIS — D509 Iron deficiency anemia, unspecified: Secondary | ICD-10-CM | POA: Diagnosis not present

## 2018-08-27 DIAGNOSIS — E119 Type 2 diabetes mellitus without complications: Secondary | ICD-10-CM | POA: Diagnosis not present

## 2018-08-27 DIAGNOSIS — K7689 Other specified diseases of liver: Secondary | ICD-10-CM | POA: Diagnosis not present

## 2018-08-28 ENCOUNTER — Other Ambulatory Visit: Payer: Self-pay

## 2018-08-28 ENCOUNTER — Ambulatory Visit
Admission: RE | Admit: 2018-08-28 | Discharge: 2018-08-28 | Disposition: A | Discharge status: Home / Self Care | Payer: Medicare Other | Source: Ambulatory Visit | Attending: Radiation Oncology | Admitting: Radiation Oncology

## 2018-08-28 ENCOUNTER — Ambulatory Visit: Payer: Medicare Other | Admitting: Radiation Oncology

## 2018-08-28 DIAGNOSIS — C641 Malignant neoplasm of right kidney, except renal pelvis: Secondary | ICD-10-CM | POA: Diagnosis not present

## 2018-08-28 DIAGNOSIS — Z510 Encounter for antineoplastic radiation therapy: Secondary | ICD-10-CM | POA: Diagnosis not present

## 2018-08-28 DIAGNOSIS — C7989 Secondary malignant neoplasm of other specified sites: Secondary | ICD-10-CM | POA: Diagnosis not present

## 2018-08-28 DIAGNOSIS — C771 Secondary and unspecified malignant neoplasm of intrathoracic lymph nodes: Secondary | ICD-10-CM | POA: Diagnosis not present

## 2018-08-29 ENCOUNTER — Other Ambulatory Visit: Payer: Self-pay

## 2018-08-29 ENCOUNTER — Ambulatory Visit
Admission: RE | Admit: 2018-08-29 | Discharge: 2018-08-29 | Disposition: A | Discharge status: Home / Self Care | Payer: Medicare Other | Source: Ambulatory Visit | Attending: Radiation Oncology | Admitting: Radiation Oncology

## 2018-08-29 DIAGNOSIS — D509 Iron deficiency anemia, unspecified: Secondary | ICD-10-CM | POA: Diagnosis not present

## 2018-08-29 DIAGNOSIS — K7689 Other specified diseases of liver: Secondary | ICD-10-CM | POA: Diagnosis not present

## 2018-08-29 DIAGNOSIS — N2581 Secondary hyperparathyroidism of renal origin: Secondary | ICD-10-CM | POA: Diagnosis not present

## 2018-08-29 DIAGNOSIS — E119 Type 2 diabetes mellitus without complications: Secondary | ICD-10-CM | POA: Diagnosis not present

## 2018-08-29 DIAGNOSIS — D631 Anemia in chronic kidney disease: Secondary | ICD-10-CM | POA: Diagnosis not present

## 2018-08-29 DIAGNOSIS — C7989 Secondary malignant neoplasm of other specified sites: Secondary | ICD-10-CM | POA: Diagnosis not present

## 2018-08-29 DIAGNOSIS — C641 Malignant neoplasm of right kidney, except renal pelvis: Secondary | ICD-10-CM | POA: Diagnosis not present

## 2018-08-29 DIAGNOSIS — N186 End stage renal disease: Secondary | ICD-10-CM | POA: Diagnosis not present

## 2018-08-29 DIAGNOSIS — C771 Secondary and unspecified malignant neoplasm of intrathoracic lymph nodes: Secondary | ICD-10-CM | POA: Diagnosis not present

## 2018-08-29 DIAGNOSIS — Z510 Encounter for antineoplastic radiation therapy: Secondary | ICD-10-CM | POA: Diagnosis not present

## 2018-08-30 ENCOUNTER — Ambulatory Visit
Admission: RE | Admit: 2018-08-30 | Discharge: 2018-08-30 | Disposition: A | Discharge status: Home / Self Care | Payer: Medicare Other | Source: Ambulatory Visit | Attending: Radiation Oncology | Admitting: Radiation Oncology

## 2018-08-30 ENCOUNTER — Other Ambulatory Visit: Payer: Self-pay

## 2018-08-30 ENCOUNTER — Ambulatory Visit: Payer: Medicare Other | Admitting: Radiation Oncology

## 2018-08-30 DIAGNOSIS — C771 Secondary and unspecified malignant neoplasm of intrathoracic lymph nodes: Secondary | ICD-10-CM | POA: Diagnosis not present

## 2018-08-30 DIAGNOSIS — C641 Malignant neoplasm of right kidney, except renal pelvis: Secondary | ICD-10-CM | POA: Diagnosis not present

## 2018-08-30 DIAGNOSIS — Z510 Encounter for antineoplastic radiation therapy: Secondary | ICD-10-CM | POA: Diagnosis not present

## 2018-08-30 DIAGNOSIS — C7989 Secondary malignant neoplasm of other specified sites: Secondary | ICD-10-CM | POA: Diagnosis not present

## 2018-08-31 ENCOUNTER — Other Ambulatory Visit: Payer: Self-pay

## 2018-08-31 ENCOUNTER — Ambulatory Visit
Admission: RE | Admit: 2018-08-31 | Discharge: 2018-08-31 | Disposition: A | Discharge status: Home / Self Care | Payer: Medicare Other | Source: Ambulatory Visit | Attending: Radiation Oncology | Admitting: Radiation Oncology

## 2018-08-31 DIAGNOSIS — N2581 Secondary hyperparathyroidism of renal origin: Secondary | ICD-10-CM | POA: Diagnosis not present

## 2018-08-31 DIAGNOSIS — C7989 Secondary malignant neoplasm of other specified sites: Secondary | ICD-10-CM | POA: Diagnosis not present

## 2018-08-31 DIAGNOSIS — N186 End stage renal disease: Secondary | ICD-10-CM | POA: Diagnosis not present

## 2018-08-31 DIAGNOSIS — Z510 Encounter for antineoplastic radiation therapy: Secondary | ICD-10-CM | POA: Diagnosis not present

## 2018-08-31 DIAGNOSIS — D631 Anemia in chronic kidney disease: Secondary | ICD-10-CM | POA: Diagnosis not present

## 2018-08-31 DIAGNOSIS — D509 Iron deficiency anemia, unspecified: Secondary | ICD-10-CM | POA: Diagnosis not present

## 2018-08-31 DIAGNOSIS — K7689 Other specified diseases of liver: Secondary | ICD-10-CM | POA: Diagnosis not present

## 2018-08-31 DIAGNOSIS — C641 Malignant neoplasm of right kidney, except renal pelvis: Secondary | ICD-10-CM | POA: Diagnosis not present

## 2018-08-31 DIAGNOSIS — E119 Type 2 diabetes mellitus without complications: Secondary | ICD-10-CM | POA: Diagnosis not present

## 2018-08-31 DIAGNOSIS — C771 Secondary and unspecified malignant neoplasm of intrathoracic lymph nodes: Secondary | ICD-10-CM | POA: Diagnosis not present

## 2018-09-03 ENCOUNTER — Other Ambulatory Visit: Payer: Self-pay

## 2018-09-03 ENCOUNTER — Ambulatory Visit: Payer: Medicare Other | Admitting: Radiation Oncology

## 2018-09-03 ENCOUNTER — Ambulatory Visit
Admission: RE | Admit: 2018-09-03 | Discharge: 2018-09-03 | Disposition: A | Discharge status: Home / Self Care | Payer: Medicare Other | Source: Ambulatory Visit | Attending: Radiation Oncology | Admitting: Radiation Oncology

## 2018-09-03 DIAGNOSIS — D509 Iron deficiency anemia, unspecified: Secondary | ICD-10-CM | POA: Diagnosis not present

## 2018-09-03 DIAGNOSIS — C641 Malignant neoplasm of right kidney, except renal pelvis: Secondary | ICD-10-CM | POA: Insufficient documentation

## 2018-09-03 DIAGNOSIS — C7989 Secondary malignant neoplasm of other specified sites: Secondary | ICD-10-CM | POA: Insufficient documentation

## 2018-09-03 DIAGNOSIS — E119 Type 2 diabetes mellitus without complications: Secondary | ICD-10-CM | POA: Diagnosis not present

## 2018-09-03 DIAGNOSIS — E1129 Type 2 diabetes mellitus with other diabetic kidney complication: Secondary | ICD-10-CM | POA: Diagnosis not present

## 2018-09-03 DIAGNOSIS — Z510 Encounter for antineoplastic radiation therapy: Secondary | ICD-10-CM | POA: Diagnosis not present

## 2018-09-03 DIAGNOSIS — N2581 Secondary hyperparathyroidism of renal origin: Secondary | ICD-10-CM | POA: Diagnosis not present

## 2018-09-03 DIAGNOSIS — K7689 Other specified diseases of liver: Secondary | ICD-10-CM | POA: Diagnosis not present

## 2018-09-03 DIAGNOSIS — C771 Secondary and unspecified malignant neoplasm of intrathoracic lymph nodes: Secondary | ICD-10-CM | POA: Diagnosis not present

## 2018-09-03 DIAGNOSIS — N186 End stage renal disease: Secondary | ICD-10-CM | POA: Diagnosis not present

## 2018-09-03 DIAGNOSIS — D631 Anemia in chronic kidney disease: Secondary | ICD-10-CM | POA: Diagnosis not present

## 2018-09-03 DIAGNOSIS — Z992 Dependence on renal dialysis: Secondary | ICD-10-CM | POA: Diagnosis not present

## 2018-09-04 ENCOUNTER — Other Ambulatory Visit: Payer: Self-pay

## 2018-09-04 ENCOUNTER — Ambulatory Visit
Admission: RE | Admit: 2018-09-04 | Discharge: 2018-09-04 | Disposition: A | Discharge status: Home / Self Care | Payer: Medicare Other | Source: Ambulatory Visit | Attending: Radiation Oncology | Admitting: Radiation Oncology

## 2018-09-04 ENCOUNTER — Encounter: Payer: Self-pay | Admitting: Radiation Oncology

## 2018-09-04 DIAGNOSIS — Z510 Encounter for antineoplastic radiation therapy: Secondary | ICD-10-CM | POA: Diagnosis not present

## 2018-09-04 DIAGNOSIS — C771 Secondary and unspecified malignant neoplasm of intrathoracic lymph nodes: Secondary | ICD-10-CM | POA: Diagnosis not present

## 2018-09-04 DIAGNOSIS — C641 Malignant neoplasm of right kidney, except renal pelvis: Secondary | ICD-10-CM | POA: Diagnosis not present

## 2018-09-04 DIAGNOSIS — C7989 Secondary malignant neoplasm of other specified sites: Secondary | ICD-10-CM | POA: Diagnosis not present

## 2018-09-05 ENCOUNTER — Ambulatory Visit: Payer: Medicare Other | Admitting: Radiation Oncology

## 2018-09-05 DIAGNOSIS — N2581 Secondary hyperparathyroidism of renal origin: Secondary | ICD-10-CM | POA: Diagnosis not present

## 2018-09-05 DIAGNOSIS — K7689 Other specified diseases of liver: Secondary | ICD-10-CM | POA: Diagnosis not present

## 2018-09-05 DIAGNOSIS — E119 Type 2 diabetes mellitus without complications: Secondary | ICD-10-CM | POA: Diagnosis not present

## 2018-09-05 DIAGNOSIS — N186 End stage renal disease: Secondary | ICD-10-CM | POA: Diagnosis not present

## 2018-09-05 DIAGNOSIS — D509 Iron deficiency anemia, unspecified: Secondary | ICD-10-CM | POA: Diagnosis not present

## 2018-09-05 DIAGNOSIS — D631 Anemia in chronic kidney disease: Secondary | ICD-10-CM | POA: Diagnosis not present

## 2018-09-06 ENCOUNTER — Ambulatory Visit: Payer: Medicare Other | Admitting: Radiation Oncology

## 2018-09-07 DIAGNOSIS — K7689 Other specified diseases of liver: Secondary | ICD-10-CM | POA: Diagnosis not present

## 2018-09-07 DIAGNOSIS — N2581 Secondary hyperparathyroidism of renal origin: Secondary | ICD-10-CM | POA: Diagnosis not present

## 2018-09-07 DIAGNOSIS — E119 Type 2 diabetes mellitus without complications: Secondary | ICD-10-CM | POA: Diagnosis not present

## 2018-09-07 DIAGNOSIS — D631 Anemia in chronic kidney disease: Secondary | ICD-10-CM | POA: Diagnosis not present

## 2018-09-07 DIAGNOSIS — N186 End stage renal disease: Secondary | ICD-10-CM | POA: Diagnosis not present

## 2018-09-07 DIAGNOSIS — D509 Iron deficiency anemia, unspecified: Secondary | ICD-10-CM | POA: Diagnosis not present

## 2018-09-10 DIAGNOSIS — E119 Type 2 diabetes mellitus without complications: Secondary | ICD-10-CM | POA: Diagnosis not present

## 2018-09-10 DIAGNOSIS — D631 Anemia in chronic kidney disease: Secondary | ICD-10-CM | POA: Diagnosis not present

## 2018-09-10 DIAGNOSIS — D509 Iron deficiency anemia, unspecified: Secondary | ICD-10-CM | POA: Diagnosis not present

## 2018-09-10 DIAGNOSIS — N2581 Secondary hyperparathyroidism of renal origin: Secondary | ICD-10-CM | POA: Diagnosis not present

## 2018-09-10 DIAGNOSIS — N186 End stage renal disease: Secondary | ICD-10-CM | POA: Diagnosis not present

## 2018-09-10 DIAGNOSIS — K7689 Other specified diseases of liver: Secondary | ICD-10-CM | POA: Diagnosis not present

## 2018-09-12 DIAGNOSIS — N186 End stage renal disease: Secondary | ICD-10-CM | POA: Diagnosis not present

## 2018-09-12 DIAGNOSIS — D631 Anemia in chronic kidney disease: Secondary | ICD-10-CM | POA: Diagnosis not present

## 2018-09-12 DIAGNOSIS — D509 Iron deficiency anemia, unspecified: Secondary | ICD-10-CM | POA: Diagnosis not present

## 2018-09-12 DIAGNOSIS — E119 Type 2 diabetes mellitus without complications: Secondary | ICD-10-CM | POA: Diagnosis not present

## 2018-09-12 DIAGNOSIS — N2581 Secondary hyperparathyroidism of renal origin: Secondary | ICD-10-CM | POA: Diagnosis not present

## 2018-09-12 DIAGNOSIS — K7689 Other specified diseases of liver: Secondary | ICD-10-CM | POA: Diagnosis not present

## 2018-09-14 DIAGNOSIS — N186 End stage renal disease: Secondary | ICD-10-CM | POA: Diagnosis not present

## 2018-09-14 DIAGNOSIS — D509 Iron deficiency anemia, unspecified: Secondary | ICD-10-CM | POA: Diagnosis not present

## 2018-09-14 DIAGNOSIS — E119 Type 2 diabetes mellitus without complications: Secondary | ICD-10-CM | POA: Diagnosis not present

## 2018-09-14 DIAGNOSIS — N2581 Secondary hyperparathyroidism of renal origin: Secondary | ICD-10-CM | POA: Diagnosis not present

## 2018-09-14 DIAGNOSIS — K7689 Other specified diseases of liver: Secondary | ICD-10-CM | POA: Diagnosis not present

## 2018-09-14 DIAGNOSIS — D631 Anemia in chronic kidney disease: Secondary | ICD-10-CM | POA: Diagnosis not present

## 2018-09-17 DIAGNOSIS — E119 Type 2 diabetes mellitus without complications: Secondary | ICD-10-CM | POA: Diagnosis not present

## 2018-09-17 DIAGNOSIS — N186 End stage renal disease: Secondary | ICD-10-CM | POA: Diagnosis not present

## 2018-09-17 DIAGNOSIS — D509 Iron deficiency anemia, unspecified: Secondary | ICD-10-CM | POA: Diagnosis not present

## 2018-09-17 DIAGNOSIS — K7689 Other specified diseases of liver: Secondary | ICD-10-CM | POA: Diagnosis not present

## 2018-09-17 DIAGNOSIS — N2581 Secondary hyperparathyroidism of renal origin: Secondary | ICD-10-CM | POA: Diagnosis not present

## 2018-09-17 DIAGNOSIS — D631 Anemia in chronic kidney disease: Secondary | ICD-10-CM | POA: Diagnosis not present

## 2018-09-19 DIAGNOSIS — E119 Type 2 diabetes mellitus without complications: Secondary | ICD-10-CM | POA: Diagnosis not present

## 2018-09-19 DIAGNOSIS — K7689 Other specified diseases of liver: Secondary | ICD-10-CM | POA: Diagnosis not present

## 2018-09-19 DIAGNOSIS — D631 Anemia in chronic kidney disease: Secondary | ICD-10-CM | POA: Diagnosis not present

## 2018-09-19 DIAGNOSIS — N2581 Secondary hyperparathyroidism of renal origin: Secondary | ICD-10-CM | POA: Diagnosis not present

## 2018-09-19 DIAGNOSIS — N186 End stage renal disease: Secondary | ICD-10-CM | POA: Diagnosis not present

## 2018-09-19 DIAGNOSIS — D509 Iron deficiency anemia, unspecified: Secondary | ICD-10-CM | POA: Diagnosis not present

## 2018-09-21 DIAGNOSIS — N2581 Secondary hyperparathyroidism of renal origin: Secondary | ICD-10-CM | POA: Diagnosis not present

## 2018-09-21 DIAGNOSIS — E119 Type 2 diabetes mellitus without complications: Secondary | ICD-10-CM | POA: Diagnosis not present

## 2018-09-21 DIAGNOSIS — D509 Iron deficiency anemia, unspecified: Secondary | ICD-10-CM | POA: Diagnosis not present

## 2018-09-21 DIAGNOSIS — D631 Anemia in chronic kidney disease: Secondary | ICD-10-CM | POA: Diagnosis not present

## 2018-09-21 DIAGNOSIS — N186 End stage renal disease: Secondary | ICD-10-CM | POA: Diagnosis not present

## 2018-09-21 DIAGNOSIS — K7689 Other specified diseases of liver: Secondary | ICD-10-CM | POA: Diagnosis not present

## 2018-09-24 DIAGNOSIS — K7689 Other specified diseases of liver: Secondary | ICD-10-CM | POA: Diagnosis not present

## 2018-09-24 DIAGNOSIS — D631 Anemia in chronic kidney disease: Secondary | ICD-10-CM | POA: Diagnosis not present

## 2018-09-24 DIAGNOSIS — E119 Type 2 diabetes mellitus without complications: Secondary | ICD-10-CM | POA: Diagnosis not present

## 2018-09-24 DIAGNOSIS — N186 End stage renal disease: Secondary | ICD-10-CM | POA: Diagnosis not present

## 2018-09-24 DIAGNOSIS — N2581 Secondary hyperparathyroidism of renal origin: Secondary | ICD-10-CM | POA: Diagnosis not present

## 2018-09-24 DIAGNOSIS — D509 Iron deficiency anemia, unspecified: Secondary | ICD-10-CM | POA: Diagnosis not present

## 2018-09-25 DIAGNOSIS — F4321 Adjustment disorder with depressed mood: Secondary | ICD-10-CM | POA: Diagnosis not present

## 2018-09-26 DIAGNOSIS — D509 Iron deficiency anemia, unspecified: Secondary | ICD-10-CM | POA: Diagnosis not present

## 2018-09-26 DIAGNOSIS — N186 End stage renal disease: Secondary | ICD-10-CM | POA: Diagnosis not present

## 2018-09-26 DIAGNOSIS — D631 Anemia in chronic kidney disease: Secondary | ICD-10-CM | POA: Diagnosis not present

## 2018-09-26 DIAGNOSIS — E119 Type 2 diabetes mellitus without complications: Secondary | ICD-10-CM | POA: Diagnosis not present

## 2018-09-26 DIAGNOSIS — K7689 Other specified diseases of liver: Secondary | ICD-10-CM | POA: Diagnosis not present

## 2018-09-26 DIAGNOSIS — N2581 Secondary hyperparathyroidism of renal origin: Secondary | ICD-10-CM | POA: Diagnosis not present

## 2018-09-28 DIAGNOSIS — E119 Type 2 diabetes mellitus without complications: Secondary | ICD-10-CM | POA: Diagnosis not present

## 2018-09-28 DIAGNOSIS — N2581 Secondary hyperparathyroidism of renal origin: Secondary | ICD-10-CM | POA: Diagnosis not present

## 2018-09-28 DIAGNOSIS — D631 Anemia in chronic kidney disease: Secondary | ICD-10-CM | POA: Diagnosis not present

## 2018-09-28 DIAGNOSIS — D509 Iron deficiency anemia, unspecified: Secondary | ICD-10-CM | POA: Diagnosis not present

## 2018-09-28 DIAGNOSIS — K7689 Other specified diseases of liver: Secondary | ICD-10-CM | POA: Diagnosis not present

## 2018-09-28 DIAGNOSIS — N186 End stage renal disease: Secondary | ICD-10-CM | POA: Diagnosis not present

## 2018-10-01 DIAGNOSIS — E119 Type 2 diabetes mellitus without complications: Secondary | ICD-10-CM | POA: Diagnosis not present

## 2018-10-01 DIAGNOSIS — D631 Anemia in chronic kidney disease: Secondary | ICD-10-CM | POA: Diagnosis not present

## 2018-10-01 DIAGNOSIS — N2581 Secondary hyperparathyroidism of renal origin: Secondary | ICD-10-CM | POA: Diagnosis not present

## 2018-10-01 DIAGNOSIS — N186 End stage renal disease: Secondary | ICD-10-CM | POA: Diagnosis not present

## 2018-10-01 DIAGNOSIS — D509 Iron deficiency anemia, unspecified: Secondary | ICD-10-CM | POA: Diagnosis not present

## 2018-10-01 DIAGNOSIS — K7689 Other specified diseases of liver: Secondary | ICD-10-CM | POA: Diagnosis not present

## 2018-10-03 DIAGNOSIS — Z992 Dependence on renal dialysis: Secondary | ICD-10-CM | POA: Diagnosis not present

## 2018-10-03 DIAGNOSIS — K7689 Other specified diseases of liver: Secondary | ICD-10-CM | POA: Diagnosis not present

## 2018-10-03 DIAGNOSIS — N2581 Secondary hyperparathyroidism of renal origin: Secondary | ICD-10-CM | POA: Diagnosis not present

## 2018-10-03 DIAGNOSIS — D509 Iron deficiency anemia, unspecified: Secondary | ICD-10-CM | POA: Diagnosis not present

## 2018-10-03 DIAGNOSIS — N186 End stage renal disease: Secondary | ICD-10-CM | POA: Diagnosis not present

## 2018-10-03 DIAGNOSIS — E119 Type 2 diabetes mellitus without complications: Secondary | ICD-10-CM | POA: Diagnosis not present

## 2018-10-03 DIAGNOSIS — E1129 Type 2 diabetes mellitus with other diabetic kidney complication: Secondary | ICD-10-CM | POA: Diagnosis not present

## 2018-10-03 DIAGNOSIS — D631 Anemia in chronic kidney disease: Secondary | ICD-10-CM | POA: Diagnosis not present

## 2018-10-05 DIAGNOSIS — K7689 Other specified diseases of liver: Secondary | ICD-10-CM | POA: Diagnosis not present

## 2018-10-05 DIAGNOSIS — E119 Type 2 diabetes mellitus without complications: Secondary | ICD-10-CM | POA: Diagnosis not present

## 2018-10-05 DIAGNOSIS — D509 Iron deficiency anemia, unspecified: Secondary | ICD-10-CM | POA: Diagnosis not present

## 2018-10-05 DIAGNOSIS — N2581 Secondary hyperparathyroidism of renal origin: Secondary | ICD-10-CM | POA: Diagnosis not present

## 2018-10-05 DIAGNOSIS — D631 Anemia in chronic kidney disease: Secondary | ICD-10-CM | POA: Diagnosis not present

## 2018-10-05 DIAGNOSIS — N186 End stage renal disease: Secondary | ICD-10-CM | POA: Diagnosis not present

## 2018-10-08 DIAGNOSIS — N2581 Secondary hyperparathyroidism of renal origin: Secondary | ICD-10-CM | POA: Diagnosis not present

## 2018-10-08 DIAGNOSIS — D509 Iron deficiency anemia, unspecified: Secondary | ICD-10-CM | POA: Diagnosis not present

## 2018-10-08 DIAGNOSIS — D631 Anemia in chronic kidney disease: Secondary | ICD-10-CM | POA: Diagnosis not present

## 2018-10-08 DIAGNOSIS — E119 Type 2 diabetes mellitus without complications: Secondary | ICD-10-CM | POA: Diagnosis not present

## 2018-10-08 DIAGNOSIS — K7689 Other specified diseases of liver: Secondary | ICD-10-CM | POA: Diagnosis not present

## 2018-10-08 DIAGNOSIS — N186 End stage renal disease: Secondary | ICD-10-CM | POA: Diagnosis not present

## 2018-10-10 DIAGNOSIS — N2581 Secondary hyperparathyroidism of renal origin: Secondary | ICD-10-CM | POA: Diagnosis not present

## 2018-10-10 DIAGNOSIS — N186 End stage renal disease: Secondary | ICD-10-CM | POA: Diagnosis not present

## 2018-10-10 DIAGNOSIS — D509 Iron deficiency anemia, unspecified: Secondary | ICD-10-CM | POA: Diagnosis not present

## 2018-10-10 DIAGNOSIS — E119 Type 2 diabetes mellitus without complications: Secondary | ICD-10-CM | POA: Diagnosis not present

## 2018-10-10 DIAGNOSIS — K7689 Other specified diseases of liver: Secondary | ICD-10-CM | POA: Diagnosis not present

## 2018-10-10 DIAGNOSIS — E1129 Type 2 diabetes mellitus with other diabetic kidney complication: Secondary | ICD-10-CM | POA: Diagnosis not present

## 2018-10-10 DIAGNOSIS — D631 Anemia in chronic kidney disease: Secondary | ICD-10-CM | POA: Diagnosis not present

## 2018-10-12 DIAGNOSIS — N2581 Secondary hyperparathyroidism of renal origin: Secondary | ICD-10-CM | POA: Diagnosis not present

## 2018-10-12 DIAGNOSIS — K7689 Other specified diseases of liver: Secondary | ICD-10-CM | POA: Diagnosis not present

## 2018-10-12 DIAGNOSIS — D509 Iron deficiency anemia, unspecified: Secondary | ICD-10-CM | POA: Diagnosis not present

## 2018-10-12 DIAGNOSIS — N186 End stage renal disease: Secondary | ICD-10-CM | POA: Diagnosis not present

## 2018-10-12 DIAGNOSIS — E119 Type 2 diabetes mellitus without complications: Secondary | ICD-10-CM | POA: Diagnosis not present

## 2018-10-12 DIAGNOSIS — D631 Anemia in chronic kidney disease: Secondary | ICD-10-CM | POA: Diagnosis not present

## 2018-10-15 DIAGNOSIS — N2581 Secondary hyperparathyroidism of renal origin: Secondary | ICD-10-CM | POA: Diagnosis not present

## 2018-10-15 DIAGNOSIS — K7689 Other specified diseases of liver: Secondary | ICD-10-CM | POA: Diagnosis not present

## 2018-10-15 DIAGNOSIS — E785 Hyperlipidemia, unspecified: Secondary | ICD-10-CM | POA: Diagnosis not present

## 2018-10-15 DIAGNOSIS — D509 Iron deficiency anemia, unspecified: Secondary | ICD-10-CM | POA: Diagnosis not present

## 2018-10-15 DIAGNOSIS — E119 Type 2 diabetes mellitus without complications: Secondary | ICD-10-CM | POA: Diagnosis not present

## 2018-10-15 DIAGNOSIS — D631 Anemia in chronic kidney disease: Secondary | ICD-10-CM | POA: Diagnosis not present

## 2018-10-15 DIAGNOSIS — N186 End stage renal disease: Secondary | ICD-10-CM | POA: Diagnosis not present

## 2018-10-17 DIAGNOSIS — K7689 Other specified diseases of liver: Secondary | ICD-10-CM | POA: Diagnosis not present

## 2018-10-17 DIAGNOSIS — D631 Anemia in chronic kidney disease: Secondary | ICD-10-CM | POA: Diagnosis not present

## 2018-10-17 DIAGNOSIS — N186 End stage renal disease: Secondary | ICD-10-CM | POA: Diagnosis not present

## 2018-10-17 DIAGNOSIS — N2581 Secondary hyperparathyroidism of renal origin: Secondary | ICD-10-CM | POA: Diagnosis not present

## 2018-10-17 DIAGNOSIS — E119 Type 2 diabetes mellitus without complications: Secondary | ICD-10-CM | POA: Diagnosis not present

## 2018-10-17 DIAGNOSIS — D509 Iron deficiency anemia, unspecified: Secondary | ICD-10-CM | POA: Diagnosis not present

## 2018-10-19 DIAGNOSIS — K7689 Other specified diseases of liver: Secondary | ICD-10-CM | POA: Diagnosis not present

## 2018-10-19 DIAGNOSIS — D509 Iron deficiency anemia, unspecified: Secondary | ICD-10-CM | POA: Diagnosis not present

## 2018-10-19 DIAGNOSIS — E119 Type 2 diabetes mellitus without complications: Secondary | ICD-10-CM | POA: Diagnosis not present

## 2018-10-19 DIAGNOSIS — D631 Anemia in chronic kidney disease: Secondary | ICD-10-CM | POA: Diagnosis not present

## 2018-10-19 DIAGNOSIS — N2581 Secondary hyperparathyroidism of renal origin: Secondary | ICD-10-CM | POA: Diagnosis not present

## 2018-10-19 DIAGNOSIS — N186 End stage renal disease: Secondary | ICD-10-CM | POA: Diagnosis not present

## 2018-10-22 DIAGNOSIS — N2581 Secondary hyperparathyroidism of renal origin: Secondary | ICD-10-CM | POA: Diagnosis not present

## 2018-10-22 DIAGNOSIS — D631 Anemia in chronic kidney disease: Secondary | ICD-10-CM | POA: Diagnosis not present

## 2018-10-22 DIAGNOSIS — K7689 Other specified diseases of liver: Secondary | ICD-10-CM | POA: Diagnosis not present

## 2018-10-22 DIAGNOSIS — N186 End stage renal disease: Secondary | ICD-10-CM | POA: Diagnosis not present

## 2018-10-22 DIAGNOSIS — E119 Type 2 diabetes mellitus without complications: Secondary | ICD-10-CM | POA: Diagnosis not present

## 2018-10-22 DIAGNOSIS — D509 Iron deficiency anemia, unspecified: Secondary | ICD-10-CM | POA: Diagnosis not present

## 2018-10-23 ENCOUNTER — Telehealth: Payer: Self-pay | Admitting: Radiation Oncology

## 2018-10-23 ENCOUNTER — Encounter: Payer: Self-pay | Admitting: Radiation Oncology

## 2018-10-23 DIAGNOSIS — F4323 Adjustment disorder with mixed anxiety and depressed mood: Secondary | ICD-10-CM | POA: Diagnosis not present

## 2018-10-23 NOTE — Telephone Encounter (Signed)
  Radiation Oncology         [phone]) [phone] ________________________________  Name: [name] MRN: [id number]  Date of Service: 10/23/2018  DOB: 1951-02-20  Post Treatment Telephone Note  Diagnosis:    Recurrent clear cell carcinoma of the kidney with local recurrence to the chest wall.  Interval Since Last Radiation:  7 weeks   08/23/2018-09/04/2018:  The right chest wall was treated to 50 Gy in 8 fractions.   2014: Radioactive seed implant to treat low risk prostate cancer at [facility]   Narrative:  The patient was contacted today for routine follow-up. Unfortunately his radiotherapy was delayed as the patients HD schedule and living arrangements kept him from several appointments. His wife also became ill during a hospitalization and contracted Covid19. They were living apart at the time in different skilled facilities. Unfortunately she did not recover and passed away about a week prior to his radiotherapy. He wished to proceed and during treatment he did very well with radiotherapy. He desires follow up with Dr. Alinda Money at [facility]. He reports he did very well with radiotherapy and denies any significant fatigue or skin changes. He continues his HD M,W,F.   Impression/Plan: 1.  Recurrent clear cell carcinoma of the kidney with local recurrence to the chest wall. The patient has been doing well since completion of radiotherapy. He prefers to follow up with Dr. Alinda Money in surveillance. I will follow up to see Dr. Lynne Logan plan for imaging and order staging scans if he would like.  2. End stage renal failure on HD. The patient will continue his HD M,W,F and will be followed expectantly by nephrology.    Carola Rhine, PAC

## 2018-10-24 DIAGNOSIS — N2581 Secondary hyperparathyroidism of renal origin: Secondary | ICD-10-CM | POA: Diagnosis not present

## 2018-10-24 DIAGNOSIS — K7689 Other specified diseases of liver: Secondary | ICD-10-CM | POA: Diagnosis not present

## 2018-10-24 DIAGNOSIS — D509 Iron deficiency anemia, unspecified: Secondary | ICD-10-CM | POA: Diagnosis not present

## 2018-10-24 DIAGNOSIS — E119 Type 2 diabetes mellitus without complications: Secondary | ICD-10-CM | POA: Diagnosis not present

## 2018-10-24 DIAGNOSIS — D631 Anemia in chronic kidney disease: Secondary | ICD-10-CM | POA: Diagnosis not present

## 2018-10-24 DIAGNOSIS — N186 End stage renal disease: Secondary | ICD-10-CM | POA: Diagnosis not present

## 2018-10-26 DIAGNOSIS — N2581 Secondary hyperparathyroidism of renal origin: Secondary | ICD-10-CM | POA: Diagnosis not present

## 2018-10-26 DIAGNOSIS — K7689 Other specified diseases of liver: Secondary | ICD-10-CM | POA: Diagnosis not present

## 2018-10-26 DIAGNOSIS — N186 End stage renal disease: Secondary | ICD-10-CM | POA: Diagnosis not present

## 2018-10-26 DIAGNOSIS — D631 Anemia in chronic kidney disease: Secondary | ICD-10-CM | POA: Diagnosis not present

## 2018-10-26 DIAGNOSIS — E119 Type 2 diabetes mellitus without complications: Secondary | ICD-10-CM | POA: Diagnosis not present

## 2018-10-26 DIAGNOSIS — D509 Iron deficiency anemia, unspecified: Secondary | ICD-10-CM | POA: Diagnosis not present

## 2018-10-29 DIAGNOSIS — K7689 Other specified diseases of liver: Secondary | ICD-10-CM | POA: Diagnosis not present

## 2018-10-29 DIAGNOSIS — D631 Anemia in chronic kidney disease: Secondary | ICD-10-CM | POA: Diagnosis not present

## 2018-10-29 DIAGNOSIS — E119 Type 2 diabetes mellitus without complications: Secondary | ICD-10-CM | POA: Diagnosis not present

## 2018-10-29 DIAGNOSIS — N186 End stage renal disease: Secondary | ICD-10-CM | POA: Diagnosis not present

## 2018-10-29 DIAGNOSIS — D509 Iron deficiency anemia, unspecified: Secondary | ICD-10-CM | POA: Diagnosis not present

## 2018-10-29 DIAGNOSIS — N2581 Secondary hyperparathyroidism of renal origin: Secondary | ICD-10-CM | POA: Diagnosis not present

## 2018-10-31 DIAGNOSIS — D631 Anemia in chronic kidney disease: Secondary | ICD-10-CM | POA: Diagnosis not present

## 2018-10-31 DIAGNOSIS — K7689 Other specified diseases of liver: Secondary | ICD-10-CM | POA: Diagnosis not present

## 2018-10-31 DIAGNOSIS — N186 End stage renal disease: Secondary | ICD-10-CM | POA: Diagnosis not present

## 2018-10-31 DIAGNOSIS — N2581 Secondary hyperparathyroidism of renal origin: Secondary | ICD-10-CM | POA: Diagnosis not present

## 2018-10-31 DIAGNOSIS — E119 Type 2 diabetes mellitus without complications: Secondary | ICD-10-CM | POA: Diagnosis not present

## 2018-10-31 DIAGNOSIS — D509 Iron deficiency anemia, unspecified: Secondary | ICD-10-CM | POA: Diagnosis not present

## 2018-11-02 DIAGNOSIS — D631 Anemia in chronic kidney disease: Secondary | ICD-10-CM | POA: Diagnosis not present

## 2018-11-02 DIAGNOSIS — K7689 Other specified diseases of liver: Secondary | ICD-10-CM | POA: Diagnosis not present

## 2018-11-02 DIAGNOSIS — N2581 Secondary hyperparathyroidism of renal origin: Secondary | ICD-10-CM | POA: Diagnosis not present

## 2018-11-02 DIAGNOSIS — N186 End stage renal disease: Secondary | ICD-10-CM | POA: Diagnosis not present

## 2018-11-02 DIAGNOSIS — E119 Type 2 diabetes mellitus without complications: Secondary | ICD-10-CM | POA: Diagnosis not present

## 2018-11-02 DIAGNOSIS — D509 Iron deficiency anemia, unspecified: Secondary | ICD-10-CM | POA: Diagnosis not present

## 2018-11-03 DIAGNOSIS — N186 End stage renal disease: Secondary | ICD-10-CM | POA: Diagnosis not present

## 2018-11-03 DIAGNOSIS — Z992 Dependence on renal dialysis: Secondary | ICD-10-CM | POA: Diagnosis not present

## 2018-11-03 DIAGNOSIS — E1129 Type 2 diabetes mellitus with other diabetic kidney complication: Secondary | ICD-10-CM | POA: Diagnosis not present

## 2018-11-05 DIAGNOSIS — K7689 Other specified diseases of liver: Secondary | ICD-10-CM | POA: Diagnosis not present

## 2018-11-05 DIAGNOSIS — D509 Iron deficiency anemia, unspecified: Secondary | ICD-10-CM | POA: Diagnosis not present

## 2018-11-05 DIAGNOSIS — Z992 Dependence on renal dialysis: Secondary | ICD-10-CM | POA: Diagnosis not present

## 2018-11-05 DIAGNOSIS — N186 End stage renal disease: Secondary | ICD-10-CM | POA: Diagnosis not present

## 2018-11-05 DIAGNOSIS — E119 Type 2 diabetes mellitus without complications: Secondary | ICD-10-CM | POA: Diagnosis not present

## 2018-11-05 DIAGNOSIS — N2581 Secondary hyperparathyroidism of renal origin: Secondary | ICD-10-CM | POA: Diagnosis not present

## 2018-11-05 DIAGNOSIS — D631 Anemia in chronic kidney disease: Secondary | ICD-10-CM | POA: Diagnosis not present

## 2018-11-06 NOTE — Progress Notes (Signed)
  Radiation Oncology         [phone]) [phone] ________________________________  Name: [name] MRN: [id number]  Date: 09/04/2018  DOB: 02/26/1951  End of Treatment Note  Diagnosis:   68 y.o. male with Recurrent clear cell carcinoma of the kidney with local recurrence to the chest wall  Indication for treatment:  Curative       Radiation treatment dates:   08/23/2018 - 09/04/2018  Site/dose:   The metastatic lesion in the right chest wall was treated with a course of stereotactic body radiation treatment. The patient received 50 Gy in 8 fractions at 6.25 Gy per fraction.  Beams/energy:   SBRT/SRT-VMAT // 6X-FFF Photon  Narrative: The patient tolerated radiation treatment relatively well.   The patient did not have any complaints or signs of acute toxicity during treatment.  Plan: The patient has completed radiation treatment. The patient will return to radiation oncology clinic for routine followup in one month. I advised the patient to call or return sooner if they have any questions or concerns related to their recovery or treatment.   ------------------------------------------------  Jodelle Gross, MD, PhD  This document serves as a record of services personally performed by Kyung Rudd, MD. It was created on his behalf by Rae Lips, a trained medical scribe. The creation of this record is based on the scribe's personal observations and the provider's statements to them. This document has been checked and approved by the attending provider.

## 2018-11-07 DIAGNOSIS — E119 Type 2 diabetes mellitus without complications: Secondary | ICD-10-CM | POA: Diagnosis not present

## 2018-11-07 DIAGNOSIS — Z992 Dependence on renal dialysis: Secondary | ICD-10-CM | POA: Diagnosis not present

## 2018-11-07 DIAGNOSIS — K7689 Other specified diseases of liver: Secondary | ICD-10-CM | POA: Diagnosis not present

## 2018-11-07 DIAGNOSIS — N186 End stage renal disease: Secondary | ICD-10-CM | POA: Diagnosis not present

## 2018-11-07 DIAGNOSIS — N2581 Secondary hyperparathyroidism of renal origin: Secondary | ICD-10-CM | POA: Diagnosis not present

## 2018-11-07 DIAGNOSIS — D509 Iron deficiency anemia, unspecified: Secondary | ICD-10-CM | POA: Diagnosis not present

## 2018-11-09 DIAGNOSIS — N186 End stage renal disease: Secondary | ICD-10-CM | POA: Diagnosis not present

## 2018-11-09 DIAGNOSIS — N2581 Secondary hyperparathyroidism of renal origin: Secondary | ICD-10-CM | POA: Diagnosis not present

## 2018-11-09 DIAGNOSIS — D509 Iron deficiency anemia, unspecified: Secondary | ICD-10-CM | POA: Diagnosis not present

## 2018-11-09 DIAGNOSIS — E119 Type 2 diabetes mellitus without complications: Secondary | ICD-10-CM | POA: Diagnosis not present

## 2018-11-09 DIAGNOSIS — K7689 Other specified diseases of liver: Secondary | ICD-10-CM | POA: Diagnosis not present

## 2018-11-09 DIAGNOSIS — Z992 Dependence on renal dialysis: Secondary | ICD-10-CM | POA: Diagnosis not present

## 2018-11-12 DIAGNOSIS — Z992 Dependence on renal dialysis: Secondary | ICD-10-CM | POA: Diagnosis not present

## 2018-11-12 DIAGNOSIS — D509 Iron deficiency anemia, unspecified: Secondary | ICD-10-CM | POA: Diagnosis not present

## 2018-11-12 DIAGNOSIS — E119 Type 2 diabetes mellitus without complications: Secondary | ICD-10-CM | POA: Diagnosis not present

## 2018-11-12 DIAGNOSIS — N2581 Secondary hyperparathyroidism of renal origin: Secondary | ICD-10-CM | POA: Diagnosis not present

## 2018-11-12 DIAGNOSIS — K7689 Other specified diseases of liver: Secondary | ICD-10-CM | POA: Diagnosis not present

## 2018-11-12 DIAGNOSIS — N186 End stage renal disease: Secondary | ICD-10-CM | POA: Diagnosis not present

## 2018-11-13 DIAGNOSIS — I739 Peripheral vascular disease, unspecified: Secondary | ICD-10-CM | POA: Diagnosis not present

## 2018-11-13 DIAGNOSIS — Q845 Enlarged and hypertrophic nails: Secondary | ICD-10-CM | POA: Diagnosis not present

## 2018-11-13 DIAGNOSIS — L603 Nail dystrophy: Secondary | ICD-10-CM | POA: Diagnosis not present

## 2018-11-13 DIAGNOSIS — B351 Tinea unguium: Secondary | ICD-10-CM | POA: Diagnosis not present

## 2018-11-14 DIAGNOSIS — K7689 Other specified diseases of liver: Secondary | ICD-10-CM | POA: Diagnosis not present

## 2018-11-14 DIAGNOSIS — E119 Type 2 diabetes mellitus without complications: Secondary | ICD-10-CM | POA: Diagnosis not present

## 2018-11-14 DIAGNOSIS — Z992 Dependence on renal dialysis: Secondary | ICD-10-CM | POA: Diagnosis not present

## 2018-11-14 DIAGNOSIS — D509 Iron deficiency anemia, unspecified: Secondary | ICD-10-CM | POA: Diagnosis not present

## 2018-11-14 DIAGNOSIS — N186 End stage renal disease: Secondary | ICD-10-CM | POA: Diagnosis not present

## 2018-11-14 DIAGNOSIS — N2581 Secondary hyperparathyroidism of renal origin: Secondary | ICD-10-CM | POA: Diagnosis not present

## 2018-11-15 DIAGNOSIS — C771 Secondary and unspecified malignant neoplasm of intrathoracic lymph nodes: Secondary | ICD-10-CM | POA: Insufficient documentation

## 2018-11-15 NOTE — Progress Notes (Signed)
[facility] Simulation and Treatment Planning Note   Name:  [name] MRN: [id number]   Date: 08/16/2018  DOB: 09-Apr-1950  Status:outpatient    DIAGNOSIS:    ICD-10-CM   1. Secondary and unspecified malignant neoplasm of intrathoracic lymph nodes ([place])  C77.1      CONSENT VERIFIED:yes   SET UP: Patient is setup supine   IMMOBILIZATION: The patient was immobilized using a customized Vac Loc bag/ blue bag and customized accuform device   NARRATIVE:The patient was brought to the [facility].  Identity was confirmed.  All relevant records and images related to the planned course of therapy were reviewed.  Then, the patient was positioned in a stable reproducible clinical set-up for radiation therapy. Abdominal compression was applied by me.  4D CT images were obtained and reproducible breathing pattern was confirmed. Free breathing CT images were obtained.  Skin markings were placed.  The CT images were loaded into the planning software where the target and avoidance structures were contoured.  The radiation prescription was entered and confirmed.    TREATMENT PLANNING NOTE:  Treatment planning then occurred. I have requested : IMRT planning.This treatment technique is medically necessary due to the high-dose of radiation delivered to the target region which is in close proximity to adjacent critical normal structures.  3 dimensional simulation is performed and dose volume histogram of the gross tumor volume, planning tumor volume and criticial normal structures including the spinal cord and lungs were analyzed and requested.  Special treatment procedure was performed due to high dose per fraction.  The patient will be monitored for increased risk of toxicity.  Daily imaging using cone beam CT will be used for target localization.  I anticipate that the patient will receive 60 Gy in 5 fractions to target volume. Further  adjustments will be made based on the planning process is necessary.  ------------------------------------------------  Jodelle Gross, MD, PhD

## 2018-11-16 DIAGNOSIS — Z992 Dependence on renal dialysis: Secondary | ICD-10-CM | POA: Diagnosis not present

## 2018-11-16 DIAGNOSIS — E119 Type 2 diabetes mellitus without complications: Secondary | ICD-10-CM | POA: Diagnosis not present

## 2018-11-16 DIAGNOSIS — N186 End stage renal disease: Secondary | ICD-10-CM | POA: Diagnosis not present

## 2018-11-16 DIAGNOSIS — D509 Iron deficiency anemia, unspecified: Secondary | ICD-10-CM | POA: Diagnosis not present

## 2018-11-16 DIAGNOSIS — K7689 Other specified diseases of liver: Secondary | ICD-10-CM | POA: Diagnosis not present

## 2018-11-16 DIAGNOSIS — N2581 Secondary hyperparathyroidism of renal origin: Secondary | ICD-10-CM | POA: Diagnosis not present

## 2018-11-19 DIAGNOSIS — Z992 Dependence on renal dialysis: Secondary | ICD-10-CM | POA: Diagnosis not present

## 2018-11-19 DIAGNOSIS — N186 End stage renal disease: Secondary | ICD-10-CM | POA: Diagnosis not present

## 2018-11-19 DIAGNOSIS — D509 Iron deficiency anemia, unspecified: Secondary | ICD-10-CM | POA: Diagnosis not present

## 2018-11-19 DIAGNOSIS — N2581 Secondary hyperparathyroidism of renal origin: Secondary | ICD-10-CM | POA: Diagnosis not present

## 2018-11-19 DIAGNOSIS — E119 Type 2 diabetes mellitus without complications: Secondary | ICD-10-CM | POA: Diagnosis not present

## 2018-11-19 DIAGNOSIS — K7689 Other specified diseases of liver: Secondary | ICD-10-CM | POA: Diagnosis not present

## 2018-11-20 DIAGNOSIS — F4323 Adjustment disorder with mixed anxiety and depressed mood: Secondary | ICD-10-CM | POA: Diagnosis not present

## 2018-11-21 DIAGNOSIS — N186 End stage renal disease: Secondary | ICD-10-CM | POA: Diagnosis not present

## 2018-11-21 DIAGNOSIS — N2581 Secondary hyperparathyroidism of renal origin: Secondary | ICD-10-CM | POA: Diagnosis not present

## 2018-11-21 DIAGNOSIS — Z992 Dependence on renal dialysis: Secondary | ICD-10-CM | POA: Diagnosis not present

## 2018-11-21 DIAGNOSIS — K7689 Other specified diseases of liver: Secondary | ICD-10-CM | POA: Diagnosis not present

## 2018-11-21 DIAGNOSIS — D509 Iron deficiency anemia, unspecified: Secondary | ICD-10-CM | POA: Diagnosis not present

## 2018-11-21 DIAGNOSIS — E119 Type 2 diabetes mellitus without complications: Secondary | ICD-10-CM | POA: Diagnosis not present

## 2018-11-23 DIAGNOSIS — Z992 Dependence on renal dialysis: Secondary | ICD-10-CM | POA: Diagnosis not present

## 2018-11-23 DIAGNOSIS — N2581 Secondary hyperparathyroidism of renal origin: Secondary | ICD-10-CM | POA: Diagnosis not present

## 2018-11-23 DIAGNOSIS — K7689 Other specified diseases of liver: Secondary | ICD-10-CM | POA: Diagnosis not present

## 2018-11-23 DIAGNOSIS — E119 Type 2 diabetes mellitus without complications: Secondary | ICD-10-CM | POA: Diagnosis not present

## 2018-11-23 DIAGNOSIS — D509 Iron deficiency anemia, unspecified: Secondary | ICD-10-CM | POA: Diagnosis not present

## 2018-11-23 DIAGNOSIS — N186 End stage renal disease: Secondary | ICD-10-CM | POA: Diagnosis not present

## 2018-11-26 DIAGNOSIS — E119 Type 2 diabetes mellitus without complications: Secondary | ICD-10-CM | POA: Diagnosis not present

## 2018-11-26 DIAGNOSIS — N2581 Secondary hyperparathyroidism of renal origin: Secondary | ICD-10-CM | POA: Diagnosis not present

## 2018-11-26 DIAGNOSIS — Z992 Dependence on renal dialysis: Secondary | ICD-10-CM | POA: Diagnosis not present

## 2018-11-26 DIAGNOSIS — K7689 Other specified diseases of liver: Secondary | ICD-10-CM | POA: Diagnosis not present

## 2018-11-26 DIAGNOSIS — D509 Iron deficiency anemia, unspecified: Secondary | ICD-10-CM | POA: Diagnosis not present

## 2018-11-26 DIAGNOSIS — N186 End stage renal disease: Secondary | ICD-10-CM | POA: Diagnosis not present

## 2018-11-28 DIAGNOSIS — K7689 Other specified diseases of liver: Secondary | ICD-10-CM | POA: Diagnosis not present

## 2018-11-28 DIAGNOSIS — E119 Type 2 diabetes mellitus without complications: Secondary | ICD-10-CM | POA: Diagnosis not present

## 2018-11-28 DIAGNOSIS — N2581 Secondary hyperparathyroidism of renal origin: Secondary | ICD-10-CM | POA: Diagnosis not present

## 2018-11-28 DIAGNOSIS — Z992 Dependence on renal dialysis: Secondary | ICD-10-CM | POA: Diagnosis not present

## 2018-11-28 DIAGNOSIS — D509 Iron deficiency anemia, unspecified: Secondary | ICD-10-CM | POA: Diagnosis not present

## 2018-11-28 DIAGNOSIS — N186 End stage renal disease: Secondary | ICD-10-CM | POA: Diagnosis not present

## 2018-11-30 DIAGNOSIS — N186 End stage renal disease: Secondary | ICD-10-CM | POA: Diagnosis not present

## 2018-11-30 DIAGNOSIS — E119 Type 2 diabetes mellitus without complications: Secondary | ICD-10-CM | POA: Diagnosis not present

## 2018-11-30 DIAGNOSIS — N2581 Secondary hyperparathyroidism of renal origin: Secondary | ICD-10-CM | POA: Diagnosis not present

## 2018-11-30 DIAGNOSIS — D509 Iron deficiency anemia, unspecified: Secondary | ICD-10-CM | POA: Diagnosis not present

## 2018-11-30 DIAGNOSIS — K7689 Other specified diseases of liver: Secondary | ICD-10-CM | POA: Diagnosis not present

## 2018-11-30 DIAGNOSIS — Z992 Dependence on renal dialysis: Secondary | ICD-10-CM | POA: Diagnosis not present

## 2018-12-03 DIAGNOSIS — Z992 Dependence on renal dialysis: Secondary | ICD-10-CM | POA: Diagnosis not present

## 2018-12-03 DIAGNOSIS — K7689 Other specified diseases of liver: Secondary | ICD-10-CM | POA: Diagnosis not present

## 2018-12-03 DIAGNOSIS — N2581 Secondary hyperparathyroidism of renal origin: Secondary | ICD-10-CM | POA: Diagnosis not present

## 2018-12-03 DIAGNOSIS — E119 Type 2 diabetes mellitus without complications: Secondary | ICD-10-CM | POA: Diagnosis not present

## 2018-12-03 DIAGNOSIS — N186 End stage renal disease: Secondary | ICD-10-CM | POA: Diagnosis not present

## 2018-12-03 DIAGNOSIS — D509 Iron deficiency anemia, unspecified: Secondary | ICD-10-CM | POA: Diagnosis not present

## 2018-12-04 DIAGNOSIS — E1129 Type 2 diabetes mellitus with other diabetic kidney complication: Secondary | ICD-10-CM | POA: Diagnosis not present

## 2018-12-04 DIAGNOSIS — Z992 Dependence on renal dialysis: Secondary | ICD-10-CM | POA: Diagnosis not present

## 2018-12-04 DIAGNOSIS — N186 End stage renal disease: Secondary | ICD-10-CM | POA: Diagnosis not present

## 2018-12-05 DIAGNOSIS — D509 Iron deficiency anemia, unspecified: Secondary | ICD-10-CM | POA: Diagnosis not present

## 2018-12-05 DIAGNOSIS — D631 Anemia in chronic kidney disease: Secondary | ICD-10-CM | POA: Diagnosis not present

## 2018-12-05 DIAGNOSIS — Z23 Encounter for immunization: Secondary | ICD-10-CM | POA: Diagnosis not present

## 2018-12-05 DIAGNOSIS — K7689 Other specified diseases of liver: Secondary | ICD-10-CM | POA: Diagnosis not present

## 2018-12-05 DIAGNOSIS — N2581 Secondary hyperparathyroidism of renal origin: Secondary | ICD-10-CM | POA: Diagnosis not present

## 2018-12-05 DIAGNOSIS — E119 Type 2 diabetes mellitus without complications: Secondary | ICD-10-CM | POA: Diagnosis not present

## 2018-12-05 DIAGNOSIS — Z992 Dependence on renal dialysis: Secondary | ICD-10-CM | POA: Diagnosis not present

## 2018-12-05 DIAGNOSIS — N186 End stage renal disease: Secondary | ICD-10-CM | POA: Diagnosis not present

## 2018-12-07 DIAGNOSIS — N2581 Secondary hyperparathyroidism of renal origin: Secondary | ICD-10-CM | POA: Diagnosis not present

## 2018-12-07 DIAGNOSIS — D509 Iron deficiency anemia, unspecified: Secondary | ICD-10-CM | POA: Diagnosis not present

## 2018-12-07 DIAGNOSIS — N186 End stage renal disease: Secondary | ICD-10-CM | POA: Diagnosis not present

## 2018-12-07 DIAGNOSIS — K7689 Other specified diseases of liver: Secondary | ICD-10-CM | POA: Diagnosis not present

## 2018-12-07 DIAGNOSIS — E119 Type 2 diabetes mellitus without complications: Secondary | ICD-10-CM | POA: Diagnosis not present

## 2018-12-07 DIAGNOSIS — Z992 Dependence on renal dialysis: Secondary | ICD-10-CM | POA: Diagnosis not present

## 2018-12-10 DIAGNOSIS — E119 Type 2 diabetes mellitus without complications: Secondary | ICD-10-CM | POA: Diagnosis not present

## 2018-12-10 DIAGNOSIS — N186 End stage renal disease: Secondary | ICD-10-CM | POA: Diagnosis not present

## 2018-12-10 DIAGNOSIS — N2581 Secondary hyperparathyroidism of renal origin: Secondary | ICD-10-CM | POA: Diagnosis not present

## 2018-12-10 DIAGNOSIS — Z992 Dependence on renal dialysis: Secondary | ICD-10-CM | POA: Diagnosis not present

## 2018-12-10 DIAGNOSIS — K7689 Other specified diseases of liver: Secondary | ICD-10-CM | POA: Diagnosis not present

## 2018-12-10 DIAGNOSIS — D509 Iron deficiency anemia, unspecified: Secondary | ICD-10-CM | POA: Diagnosis not present

## 2018-12-12 DIAGNOSIS — E119 Type 2 diabetes mellitus without complications: Secondary | ICD-10-CM | POA: Diagnosis not present

## 2018-12-12 DIAGNOSIS — Z992 Dependence on renal dialysis: Secondary | ICD-10-CM | POA: Diagnosis not present

## 2018-12-12 DIAGNOSIS — K7689 Other specified diseases of liver: Secondary | ICD-10-CM | POA: Diagnosis not present

## 2018-12-12 DIAGNOSIS — D509 Iron deficiency anemia, unspecified: Secondary | ICD-10-CM | POA: Diagnosis not present

## 2018-12-12 DIAGNOSIS — N186 End stage renal disease: Secondary | ICD-10-CM | POA: Diagnosis not present

## 2018-12-12 DIAGNOSIS — N2581 Secondary hyperparathyroidism of renal origin: Secondary | ICD-10-CM | POA: Diagnosis not present

## 2018-12-14 DIAGNOSIS — K7689 Other specified diseases of liver: Secondary | ICD-10-CM | POA: Diagnosis not present

## 2018-12-14 DIAGNOSIS — E119 Type 2 diabetes mellitus without complications: Secondary | ICD-10-CM | POA: Diagnosis not present

## 2018-12-14 DIAGNOSIS — N186 End stage renal disease: Secondary | ICD-10-CM | POA: Diagnosis not present

## 2018-12-14 DIAGNOSIS — Z992 Dependence on renal dialysis: Secondary | ICD-10-CM | POA: Diagnosis not present

## 2018-12-14 DIAGNOSIS — D509 Iron deficiency anemia, unspecified: Secondary | ICD-10-CM | POA: Diagnosis not present

## 2018-12-14 DIAGNOSIS — N2581 Secondary hyperparathyroidism of renal origin: Secondary | ICD-10-CM | POA: Diagnosis not present

## 2018-12-17 DIAGNOSIS — Z992 Dependence on renal dialysis: Secondary | ICD-10-CM | POA: Diagnosis not present

## 2018-12-17 DIAGNOSIS — K7689 Other specified diseases of liver: Secondary | ICD-10-CM | POA: Diagnosis not present

## 2018-12-17 DIAGNOSIS — D509 Iron deficiency anemia, unspecified: Secondary | ICD-10-CM | POA: Diagnosis not present

## 2018-12-17 DIAGNOSIS — N2581 Secondary hyperparathyroidism of renal origin: Secondary | ICD-10-CM | POA: Diagnosis not present

## 2018-12-17 DIAGNOSIS — N186 End stage renal disease: Secondary | ICD-10-CM | POA: Diagnosis not present

## 2018-12-17 DIAGNOSIS — E119 Type 2 diabetes mellitus without complications: Secondary | ICD-10-CM | POA: Diagnosis not present

## 2018-12-18 DIAGNOSIS — F4323 Adjustment disorder with mixed anxiety and depressed mood: Secondary | ICD-10-CM | POA: Diagnosis not present

## 2018-12-19 DIAGNOSIS — N186 End stage renal disease: Secondary | ICD-10-CM | POA: Diagnosis not present

## 2018-12-19 DIAGNOSIS — E119 Type 2 diabetes mellitus without complications: Secondary | ICD-10-CM | POA: Diagnosis not present

## 2018-12-19 DIAGNOSIS — K7689 Other specified diseases of liver: Secondary | ICD-10-CM | POA: Diagnosis not present

## 2018-12-19 DIAGNOSIS — Z992 Dependence on renal dialysis: Secondary | ICD-10-CM | POA: Diagnosis not present

## 2018-12-19 DIAGNOSIS — N2581 Secondary hyperparathyroidism of renal origin: Secondary | ICD-10-CM | POA: Diagnosis not present

## 2018-12-19 DIAGNOSIS — D509 Iron deficiency anemia, unspecified: Secondary | ICD-10-CM | POA: Diagnosis not present

## 2018-12-21 DIAGNOSIS — K7689 Other specified diseases of liver: Secondary | ICD-10-CM | POA: Diagnosis not present

## 2018-12-21 DIAGNOSIS — Z992 Dependence on renal dialysis: Secondary | ICD-10-CM | POA: Diagnosis not present

## 2018-12-21 DIAGNOSIS — N2581 Secondary hyperparathyroidism of renal origin: Secondary | ICD-10-CM | POA: Diagnosis not present

## 2018-12-21 DIAGNOSIS — D509 Iron deficiency anemia, unspecified: Secondary | ICD-10-CM | POA: Diagnosis not present

## 2018-12-21 DIAGNOSIS — N186 End stage renal disease: Secondary | ICD-10-CM | POA: Diagnosis not present

## 2018-12-21 DIAGNOSIS — E119 Type 2 diabetes mellitus without complications: Secondary | ICD-10-CM | POA: Diagnosis not present

## 2018-12-24 DIAGNOSIS — K7689 Other specified diseases of liver: Secondary | ICD-10-CM | POA: Diagnosis not present

## 2018-12-24 DIAGNOSIS — D509 Iron deficiency anemia, unspecified: Secondary | ICD-10-CM | POA: Diagnosis not present

## 2018-12-24 DIAGNOSIS — N186 End stage renal disease: Secondary | ICD-10-CM | POA: Diagnosis not present

## 2018-12-24 DIAGNOSIS — N2581 Secondary hyperparathyroidism of renal origin: Secondary | ICD-10-CM | POA: Diagnosis not present

## 2018-12-24 DIAGNOSIS — Z992 Dependence on renal dialysis: Secondary | ICD-10-CM | POA: Diagnosis not present

## 2018-12-24 DIAGNOSIS — E119 Type 2 diabetes mellitus without complications: Secondary | ICD-10-CM | POA: Diagnosis not present

## 2018-12-25 DIAGNOSIS — C641 Malignant neoplasm of right kidney, except renal pelvis: Secondary | ICD-10-CM | POA: Diagnosis not present

## 2018-12-25 DIAGNOSIS — C649 Malignant neoplasm of unspecified kidney, except renal pelvis: Secondary | ICD-10-CM | POA: Diagnosis not present

## 2018-12-25 DIAGNOSIS — J90 Pleural effusion, not elsewhere classified: Secondary | ICD-10-CM | POA: Diagnosis not present

## 2018-12-26 DIAGNOSIS — E119 Type 2 diabetes mellitus without complications: Secondary | ICD-10-CM | POA: Diagnosis not present

## 2018-12-26 DIAGNOSIS — K7689 Other specified diseases of liver: Secondary | ICD-10-CM | POA: Diagnosis not present

## 2018-12-26 DIAGNOSIS — N186 End stage renal disease: Secondary | ICD-10-CM | POA: Diagnosis not present

## 2018-12-26 DIAGNOSIS — D509 Iron deficiency anemia, unspecified: Secondary | ICD-10-CM | POA: Diagnosis not present

## 2018-12-26 DIAGNOSIS — Z992 Dependence on renal dialysis: Secondary | ICD-10-CM | POA: Diagnosis not present

## 2018-12-26 DIAGNOSIS — N2581 Secondary hyperparathyroidism of renal origin: Secondary | ICD-10-CM | POA: Diagnosis not present

## 2018-12-27 DIAGNOSIS — Z125 Encounter for screening for malignant neoplasm of prostate: Secondary | ICD-10-CM | POA: Diagnosis not present

## 2018-12-27 DIAGNOSIS — E1151 Type 2 diabetes mellitus with diabetic peripheral angiopathy without gangrene: Secondary | ICD-10-CM | POA: Diagnosis not present

## 2018-12-27 DIAGNOSIS — E7849 Other hyperlipidemia: Secondary | ICD-10-CM | POA: Diagnosis not present

## 2018-12-27 DIAGNOSIS — E559 Vitamin D deficiency, unspecified: Secondary | ICD-10-CM | POA: Diagnosis not present

## 2018-12-27 DIAGNOSIS — M810 Age-related osteoporosis without current pathological fracture: Secondary | ICD-10-CM | POA: Diagnosis not present

## 2018-12-28 DIAGNOSIS — K7689 Other specified diseases of liver: Secondary | ICD-10-CM | POA: Diagnosis not present

## 2018-12-28 DIAGNOSIS — Z992 Dependence on renal dialysis: Secondary | ICD-10-CM | POA: Diagnosis not present

## 2018-12-28 DIAGNOSIS — N2581 Secondary hyperparathyroidism of renal origin: Secondary | ICD-10-CM | POA: Diagnosis not present

## 2018-12-28 DIAGNOSIS — N186 End stage renal disease: Secondary | ICD-10-CM | POA: Diagnosis not present

## 2018-12-28 DIAGNOSIS — E119 Type 2 diabetes mellitus without complications: Secondary | ICD-10-CM | POA: Diagnosis not present

## 2018-12-28 DIAGNOSIS — D509 Iron deficiency anemia, unspecified: Secondary | ICD-10-CM | POA: Diagnosis not present

## 2018-12-31 DIAGNOSIS — N2581 Secondary hyperparathyroidism of renal origin: Secondary | ICD-10-CM | POA: Diagnosis not present

## 2018-12-31 DIAGNOSIS — E119 Type 2 diabetes mellitus without complications: Secondary | ICD-10-CM | POA: Diagnosis not present

## 2018-12-31 DIAGNOSIS — D509 Iron deficiency anemia, unspecified: Secondary | ICD-10-CM | POA: Diagnosis not present

## 2018-12-31 DIAGNOSIS — Z992 Dependence on renal dialysis: Secondary | ICD-10-CM | POA: Diagnosis not present

## 2018-12-31 DIAGNOSIS — K7689 Other specified diseases of liver: Secondary | ICD-10-CM | POA: Diagnosis not present

## 2018-12-31 DIAGNOSIS — N186 End stage renal disease: Secondary | ICD-10-CM | POA: Diagnosis not present

## 2019-01-01 DIAGNOSIS — C641 Malignant neoplasm of right kidney, except renal pelvis: Secondary | ICD-10-CM | POA: Diagnosis not present

## 2019-01-01 DIAGNOSIS — Z0000 Encounter for general adult medical examination without abnormal findings: Secondary | ICD-10-CM | POA: Diagnosis not present

## 2019-01-01 DIAGNOSIS — Z1331 Encounter for screening for depression: Secondary | ICD-10-CM | POA: Diagnosis not present

## 2019-01-01 DIAGNOSIS — E785 Hyperlipidemia, unspecified: Secondary | ICD-10-CM | POA: Diagnosis not present

## 2019-01-01 DIAGNOSIS — Z992 Dependence on renal dialysis: Secondary | ICD-10-CM | POA: Diagnosis not present

## 2019-01-01 DIAGNOSIS — D649 Anemia, unspecified: Secondary | ICD-10-CM | POA: Diagnosis not present

## 2019-01-01 DIAGNOSIS — E1151 Type 2 diabetes mellitus with diabetic peripheral angiopathy without gangrene: Secondary | ICD-10-CM | POA: Diagnosis not present

## 2019-01-01 DIAGNOSIS — E1139 Type 2 diabetes mellitus with other diabetic ophthalmic complication: Secondary | ICD-10-CM | POA: Diagnosis not present

## 2019-01-01 DIAGNOSIS — I25810 Atherosclerosis of coronary artery bypass graft(s) without angina pectoris: Secondary | ICD-10-CM | POA: Diagnosis not present

## 2019-01-01 DIAGNOSIS — I10 Essential (primary) hypertension: Secondary | ICD-10-CM | POA: Diagnosis not present

## 2019-01-01 DIAGNOSIS — D696 Thrombocytopenia, unspecified: Secondary | ICD-10-CM | POA: Diagnosis not present

## 2019-01-01 DIAGNOSIS — I700 Atherosclerosis of aorta: Secondary | ICD-10-CM | POA: Diagnosis not present

## 2019-01-01 DIAGNOSIS — C61 Malignant neoplasm of prostate: Secondary | ICD-10-CM | POA: Diagnosis not present

## 2019-01-01 DIAGNOSIS — E1129 Type 2 diabetes mellitus with other diabetic kidney complication: Secondary | ICD-10-CM | POA: Diagnosis not present

## 2019-01-01 DIAGNOSIS — C7989 Secondary malignant neoplasm of other specified sites: Secondary | ICD-10-CM | POA: Diagnosis not present

## 2019-01-02 DIAGNOSIS — E119 Type 2 diabetes mellitus without complications: Secondary | ICD-10-CM | POA: Diagnosis not present

## 2019-01-02 DIAGNOSIS — Z1212 Encounter for screening for malignant neoplasm of rectum: Secondary | ICD-10-CM | POA: Diagnosis not present

## 2019-01-02 DIAGNOSIS — N186 End stage renal disease: Secondary | ICD-10-CM | POA: Diagnosis not present

## 2019-01-02 DIAGNOSIS — K7689 Other specified diseases of liver: Secondary | ICD-10-CM | POA: Diagnosis not present

## 2019-01-02 DIAGNOSIS — D509 Iron deficiency anemia, unspecified: Secondary | ICD-10-CM | POA: Diagnosis not present

## 2019-01-02 DIAGNOSIS — N2581 Secondary hyperparathyroidism of renal origin: Secondary | ICD-10-CM | POA: Diagnosis not present

## 2019-01-02 DIAGNOSIS — Z992 Dependence on renal dialysis: Secondary | ICD-10-CM | POA: Diagnosis not present

## 2019-01-03 DIAGNOSIS — Z992 Dependence on renal dialysis: Secondary | ICD-10-CM | POA: Diagnosis not present

## 2019-01-03 DIAGNOSIS — E1129 Type 2 diabetes mellitus with other diabetic kidney complication: Secondary | ICD-10-CM | POA: Diagnosis not present

## 2019-01-03 DIAGNOSIS — N186 End stage renal disease: Secondary | ICD-10-CM | POA: Diagnosis not present

## 2019-01-04 DIAGNOSIS — N2581 Secondary hyperparathyroidism of renal origin: Secondary | ICD-10-CM | POA: Diagnosis not present

## 2019-01-04 DIAGNOSIS — N186 End stage renal disease: Secondary | ICD-10-CM | POA: Diagnosis not present

## 2019-01-04 DIAGNOSIS — E119 Type 2 diabetes mellitus without complications: Secondary | ICD-10-CM | POA: Diagnosis not present

## 2019-01-04 DIAGNOSIS — Z992 Dependence on renal dialysis: Secondary | ICD-10-CM | POA: Diagnosis not present

## 2019-01-04 DIAGNOSIS — D509 Iron deficiency anemia, unspecified: Secondary | ICD-10-CM | POA: Diagnosis not present

## 2019-01-04 DIAGNOSIS — D631 Anemia in chronic kidney disease: Secondary | ICD-10-CM | POA: Diagnosis not present

## 2019-01-04 DIAGNOSIS — K7689 Other specified diseases of liver: Secondary | ICD-10-CM | POA: Diagnosis not present

## 2019-01-07 DIAGNOSIS — N2581 Secondary hyperparathyroidism of renal origin: Secondary | ICD-10-CM | POA: Diagnosis not present

## 2019-01-07 DIAGNOSIS — E119 Type 2 diabetes mellitus without complications: Secondary | ICD-10-CM | POA: Diagnosis not present

## 2019-01-07 DIAGNOSIS — K7689 Other specified diseases of liver: Secondary | ICD-10-CM | POA: Diagnosis not present

## 2019-01-07 DIAGNOSIS — N186 End stage renal disease: Secondary | ICD-10-CM | POA: Diagnosis not present

## 2019-01-07 DIAGNOSIS — Z992 Dependence on renal dialysis: Secondary | ICD-10-CM | POA: Diagnosis not present

## 2019-01-09 DIAGNOSIS — N186 End stage renal disease: Secondary | ICD-10-CM | POA: Diagnosis not present

## 2019-01-09 DIAGNOSIS — Z992 Dependence on renal dialysis: Secondary | ICD-10-CM | POA: Diagnosis not present

## 2019-01-09 DIAGNOSIS — E119 Type 2 diabetes mellitus without complications: Secondary | ICD-10-CM | POA: Diagnosis not present

## 2019-01-09 DIAGNOSIS — K7689 Other specified diseases of liver: Secondary | ICD-10-CM | POA: Diagnosis not present

## 2019-01-09 DIAGNOSIS — N2581 Secondary hyperparathyroidism of renal origin: Secondary | ICD-10-CM | POA: Diagnosis not present

## 2019-01-10 DIAGNOSIS — F4323 Adjustment disorder with mixed anxiety and depressed mood: Secondary | ICD-10-CM | POA: Diagnosis not present

## 2019-01-11 DIAGNOSIS — N186 End stage renal disease: Secondary | ICD-10-CM | POA: Diagnosis not present

## 2019-01-11 DIAGNOSIS — K7689 Other specified diseases of liver: Secondary | ICD-10-CM | POA: Diagnosis not present

## 2019-01-11 DIAGNOSIS — E119 Type 2 diabetes mellitus without complications: Secondary | ICD-10-CM | POA: Diagnosis not present

## 2019-01-11 DIAGNOSIS — N2581 Secondary hyperparathyroidism of renal origin: Secondary | ICD-10-CM | POA: Diagnosis not present

## 2019-01-11 DIAGNOSIS — Z992 Dependence on renal dialysis: Secondary | ICD-10-CM | POA: Diagnosis not present

## 2019-01-14 DIAGNOSIS — N2581 Secondary hyperparathyroidism of renal origin: Secondary | ICD-10-CM | POA: Diagnosis not present

## 2019-01-14 DIAGNOSIS — N186 End stage renal disease: Secondary | ICD-10-CM | POA: Diagnosis not present

## 2019-01-14 DIAGNOSIS — K7689 Other specified diseases of liver: Secondary | ICD-10-CM | POA: Diagnosis not present

## 2019-01-14 DIAGNOSIS — E119 Type 2 diabetes mellitus without complications: Secondary | ICD-10-CM | POA: Diagnosis not present

## 2019-01-14 DIAGNOSIS — Z992 Dependence on renal dialysis: Secondary | ICD-10-CM | POA: Diagnosis not present

## 2019-01-15 DIAGNOSIS — F330 Major depressive disorder, recurrent, mild: Secondary | ICD-10-CM | POA: Diagnosis not present

## 2019-01-15 DIAGNOSIS — I739 Peripheral vascular disease, unspecified: Secondary | ICD-10-CM | POA: Diagnosis not present

## 2019-01-16 DIAGNOSIS — E1129 Type 2 diabetes mellitus with other diabetic kidney complication: Secondary | ICD-10-CM | POA: Diagnosis not present

## 2019-01-16 DIAGNOSIS — E119 Type 2 diabetes mellitus without complications: Secondary | ICD-10-CM | POA: Diagnosis not present

## 2019-01-16 DIAGNOSIS — N186 End stage renal disease: Secondary | ICD-10-CM | POA: Diagnosis not present

## 2019-01-16 DIAGNOSIS — K7689 Other specified diseases of liver: Secondary | ICD-10-CM | POA: Diagnosis not present

## 2019-01-16 DIAGNOSIS — N2581 Secondary hyperparathyroidism of renal origin: Secondary | ICD-10-CM | POA: Diagnosis not present

## 2019-01-16 DIAGNOSIS — Z992 Dependence on renal dialysis: Secondary | ICD-10-CM | POA: Diagnosis not present

## 2019-01-17 DIAGNOSIS — F4323 Adjustment disorder with mixed anxiety and depressed mood: Secondary | ICD-10-CM | POA: Diagnosis not present

## 2019-01-18 DIAGNOSIS — N2581 Secondary hyperparathyroidism of renal origin: Secondary | ICD-10-CM | POA: Diagnosis not present

## 2019-01-18 DIAGNOSIS — E119 Type 2 diabetes mellitus without complications: Secondary | ICD-10-CM | POA: Diagnosis not present

## 2019-01-18 DIAGNOSIS — Z992 Dependence on renal dialysis: Secondary | ICD-10-CM | POA: Diagnosis not present

## 2019-01-18 DIAGNOSIS — K7689 Other specified diseases of liver: Secondary | ICD-10-CM | POA: Diagnosis not present

## 2019-01-18 DIAGNOSIS — N186 End stage renal disease: Secondary | ICD-10-CM | POA: Diagnosis not present

## 2019-01-21 DIAGNOSIS — K7689 Other specified diseases of liver: Secondary | ICD-10-CM | POA: Diagnosis not present

## 2019-01-21 DIAGNOSIS — N186 End stage renal disease: Secondary | ICD-10-CM | POA: Diagnosis not present

## 2019-01-21 DIAGNOSIS — Z992 Dependence on renal dialysis: Secondary | ICD-10-CM | POA: Diagnosis not present

## 2019-01-21 DIAGNOSIS — N2581 Secondary hyperparathyroidism of renal origin: Secondary | ICD-10-CM | POA: Diagnosis not present

## 2019-01-21 DIAGNOSIS — E119 Type 2 diabetes mellitus without complications: Secondary | ICD-10-CM | POA: Diagnosis not present

## 2019-01-23 DIAGNOSIS — Z992 Dependence on renal dialysis: Secondary | ICD-10-CM | POA: Diagnosis not present

## 2019-01-23 DIAGNOSIS — K7689 Other specified diseases of liver: Secondary | ICD-10-CM | POA: Diagnosis not present

## 2019-01-23 DIAGNOSIS — N2581 Secondary hyperparathyroidism of renal origin: Secondary | ICD-10-CM | POA: Diagnosis not present

## 2019-01-23 DIAGNOSIS — N186 End stage renal disease: Secondary | ICD-10-CM | POA: Diagnosis not present

## 2019-01-23 DIAGNOSIS — E119 Type 2 diabetes mellitus without complications: Secondary | ICD-10-CM | POA: Diagnosis not present

## 2019-01-25 DIAGNOSIS — E119 Type 2 diabetes mellitus without complications: Secondary | ICD-10-CM | POA: Diagnosis not present

## 2019-01-25 DIAGNOSIS — Z992 Dependence on renal dialysis: Secondary | ICD-10-CM | POA: Diagnosis not present

## 2019-01-25 DIAGNOSIS — N2581 Secondary hyperparathyroidism of renal origin: Secondary | ICD-10-CM | POA: Diagnosis not present

## 2019-01-25 DIAGNOSIS — K7689 Other specified diseases of liver: Secondary | ICD-10-CM | POA: Diagnosis not present

## 2019-01-25 DIAGNOSIS — N186 End stage renal disease: Secondary | ICD-10-CM | POA: Diagnosis not present

## 2019-01-28 DIAGNOSIS — Z20818 Contact with and (suspected) exposure to other bacterial communicable diseases: Secondary | ICD-10-CM | POA: Diagnosis not present

## 2019-01-29 DIAGNOSIS — N186 End stage renal disease: Secondary | ICD-10-CM | POA: Diagnosis not present

## 2019-01-29 DIAGNOSIS — K7689 Other specified diseases of liver: Secondary | ICD-10-CM | POA: Diagnosis not present

## 2019-01-29 DIAGNOSIS — E119 Type 2 diabetes mellitus without complications: Secondary | ICD-10-CM | POA: Diagnosis not present

## 2019-01-29 DIAGNOSIS — Z992 Dependence on renal dialysis: Secondary | ICD-10-CM | POA: Diagnosis not present

## 2019-01-29 DIAGNOSIS — N2581 Secondary hyperparathyroidism of renal origin: Secondary | ICD-10-CM | POA: Diagnosis not present

## 2019-01-30 DIAGNOSIS — K7689 Other specified diseases of liver: Secondary | ICD-10-CM | POA: Diagnosis not present

## 2019-01-30 DIAGNOSIS — N186 End stage renal disease: Secondary | ICD-10-CM | POA: Diagnosis not present

## 2019-01-30 DIAGNOSIS — Z992 Dependence on renal dialysis: Secondary | ICD-10-CM | POA: Diagnosis not present

## 2019-01-30 DIAGNOSIS — E119 Type 2 diabetes mellitus without complications: Secondary | ICD-10-CM | POA: Diagnosis not present

## 2019-01-30 DIAGNOSIS — N2581 Secondary hyperparathyroidism of renal origin: Secondary | ICD-10-CM | POA: Diagnosis not present

## 2019-02-01 DIAGNOSIS — Z992 Dependence on renal dialysis: Secondary | ICD-10-CM | POA: Diagnosis not present

## 2019-02-01 DIAGNOSIS — N186 End stage renal disease: Secondary | ICD-10-CM | POA: Diagnosis not present

## 2019-02-01 DIAGNOSIS — E119 Type 2 diabetes mellitus without complications: Secondary | ICD-10-CM | POA: Diagnosis not present

## 2019-02-01 DIAGNOSIS — N2581 Secondary hyperparathyroidism of renal origin: Secondary | ICD-10-CM | POA: Diagnosis not present

## 2019-02-01 DIAGNOSIS — K7689 Other specified diseases of liver: Secondary | ICD-10-CM | POA: Diagnosis not present

## 2019-02-03 DIAGNOSIS — E1129 Type 2 diabetes mellitus with other diabetic kidney complication: Secondary | ICD-10-CM | POA: Diagnosis not present

## 2019-02-03 DIAGNOSIS — N186 End stage renal disease: Secondary | ICD-10-CM | POA: Diagnosis not present

## 2019-02-03 DIAGNOSIS — Z992 Dependence on renal dialysis: Secondary | ICD-10-CM | POA: Diagnosis not present

## 2019-02-04 DIAGNOSIS — D509 Iron deficiency anemia, unspecified: Secondary | ICD-10-CM | POA: Diagnosis not present

## 2019-02-04 DIAGNOSIS — E119 Type 2 diabetes mellitus without complications: Secondary | ICD-10-CM | POA: Diagnosis not present

## 2019-02-04 DIAGNOSIS — N2581 Secondary hyperparathyroidism of renal origin: Secondary | ICD-10-CM | POA: Diagnosis not present

## 2019-02-04 DIAGNOSIS — Z992 Dependence on renal dialysis: Secondary | ICD-10-CM | POA: Diagnosis not present

## 2019-02-04 DIAGNOSIS — N186 End stage renal disease: Secondary | ICD-10-CM | POA: Diagnosis not present

## 2019-02-04 DIAGNOSIS — D631 Anemia in chronic kidney disease: Secondary | ICD-10-CM | POA: Diagnosis not present

## 2019-02-04 DIAGNOSIS — K7689 Other specified diseases of liver: Secondary | ICD-10-CM | POA: Diagnosis not present

## 2019-02-06 DIAGNOSIS — D509 Iron deficiency anemia, unspecified: Secondary | ICD-10-CM | POA: Diagnosis not present

## 2019-02-06 DIAGNOSIS — E119 Type 2 diabetes mellitus without complications: Secondary | ICD-10-CM | POA: Diagnosis not present

## 2019-02-06 DIAGNOSIS — Z992 Dependence on renal dialysis: Secondary | ICD-10-CM | POA: Diagnosis not present

## 2019-02-06 DIAGNOSIS — K7689 Other specified diseases of liver: Secondary | ICD-10-CM | POA: Diagnosis not present

## 2019-02-06 DIAGNOSIS — N186 End stage renal disease: Secondary | ICD-10-CM | POA: Diagnosis not present

## 2019-02-06 DIAGNOSIS — N2581 Secondary hyperparathyroidism of renal origin: Secondary | ICD-10-CM | POA: Diagnosis not present

## 2019-02-08 DIAGNOSIS — K7689 Other specified diseases of liver: Secondary | ICD-10-CM | POA: Diagnosis not present

## 2019-02-08 DIAGNOSIS — D509 Iron deficiency anemia, unspecified: Secondary | ICD-10-CM | POA: Diagnosis not present

## 2019-02-08 DIAGNOSIS — Z992 Dependence on renal dialysis: Secondary | ICD-10-CM | POA: Diagnosis not present

## 2019-02-08 DIAGNOSIS — E119 Type 2 diabetes mellitus without complications: Secondary | ICD-10-CM | POA: Diagnosis not present

## 2019-02-08 DIAGNOSIS — N186 End stage renal disease: Secondary | ICD-10-CM | POA: Diagnosis not present

## 2019-02-08 DIAGNOSIS — N2581 Secondary hyperparathyroidism of renal origin: Secondary | ICD-10-CM | POA: Diagnosis not present

## 2019-02-11 DIAGNOSIS — Z992 Dependence on renal dialysis: Secondary | ICD-10-CM | POA: Diagnosis not present

## 2019-02-11 DIAGNOSIS — N2581 Secondary hyperparathyroidism of renal origin: Secondary | ICD-10-CM | POA: Diagnosis not present

## 2019-02-11 DIAGNOSIS — K7689 Other specified diseases of liver: Secondary | ICD-10-CM | POA: Diagnosis not present

## 2019-02-11 DIAGNOSIS — N186 End stage renal disease: Secondary | ICD-10-CM | POA: Diagnosis not present

## 2019-02-11 DIAGNOSIS — E119 Type 2 diabetes mellitus without complications: Secondary | ICD-10-CM | POA: Diagnosis not present

## 2019-02-11 DIAGNOSIS — D509 Iron deficiency anemia, unspecified: Secondary | ICD-10-CM | POA: Diagnosis not present

## 2019-02-12 DIAGNOSIS — F330 Major depressive disorder, recurrent, mild: Secondary | ICD-10-CM | POA: Diagnosis not present

## 2019-02-13 DIAGNOSIS — N2581 Secondary hyperparathyroidism of renal origin: Secondary | ICD-10-CM | POA: Diagnosis not present

## 2019-02-13 DIAGNOSIS — D509 Iron deficiency anemia, unspecified: Secondary | ICD-10-CM | POA: Diagnosis not present

## 2019-02-13 DIAGNOSIS — Z992 Dependence on renal dialysis: Secondary | ICD-10-CM | POA: Diagnosis not present

## 2019-02-13 DIAGNOSIS — E119 Type 2 diabetes mellitus without complications: Secondary | ICD-10-CM | POA: Diagnosis not present

## 2019-02-13 DIAGNOSIS — K7689 Other specified diseases of liver: Secondary | ICD-10-CM | POA: Diagnosis not present

## 2019-02-13 DIAGNOSIS — N186 End stage renal disease: Secondary | ICD-10-CM | POA: Diagnosis not present

## 2019-02-15 DIAGNOSIS — D509 Iron deficiency anemia, unspecified: Secondary | ICD-10-CM | POA: Diagnosis not present

## 2019-02-15 DIAGNOSIS — E119 Type 2 diabetes mellitus without complications: Secondary | ICD-10-CM | POA: Diagnosis not present

## 2019-02-15 DIAGNOSIS — K7689 Other specified diseases of liver: Secondary | ICD-10-CM | POA: Diagnosis not present

## 2019-02-15 DIAGNOSIS — Z992 Dependence on renal dialysis: Secondary | ICD-10-CM | POA: Diagnosis not present

## 2019-02-15 DIAGNOSIS — N186 End stage renal disease: Secondary | ICD-10-CM | POA: Diagnosis not present

## 2019-02-15 DIAGNOSIS — N2581 Secondary hyperparathyroidism of renal origin: Secondary | ICD-10-CM | POA: Diagnosis not present

## 2019-02-18 DIAGNOSIS — N2581 Secondary hyperparathyroidism of renal origin: Secondary | ICD-10-CM | POA: Diagnosis not present

## 2019-02-18 DIAGNOSIS — E119 Type 2 diabetes mellitus without complications: Secondary | ICD-10-CM | POA: Diagnosis not present

## 2019-02-18 DIAGNOSIS — N186 End stage renal disease: Secondary | ICD-10-CM | POA: Diagnosis not present

## 2019-02-18 DIAGNOSIS — K7689 Other specified diseases of liver: Secondary | ICD-10-CM | POA: Diagnosis not present

## 2019-02-18 DIAGNOSIS — Z992 Dependence on renal dialysis: Secondary | ICD-10-CM | POA: Diagnosis not present

## 2019-02-18 DIAGNOSIS — D509 Iron deficiency anemia, unspecified: Secondary | ICD-10-CM | POA: Diagnosis not present

## 2019-02-19 DIAGNOSIS — F330 Major depressive disorder, recurrent, mild: Secondary | ICD-10-CM | POA: Diagnosis not present

## 2019-02-20 DIAGNOSIS — N2581 Secondary hyperparathyroidism of renal origin: Secondary | ICD-10-CM | POA: Diagnosis not present

## 2019-02-20 DIAGNOSIS — E119 Type 2 diabetes mellitus without complications: Secondary | ICD-10-CM | POA: Diagnosis not present

## 2019-02-20 DIAGNOSIS — N186 End stage renal disease: Secondary | ICD-10-CM | POA: Diagnosis not present

## 2019-02-20 DIAGNOSIS — K7689 Other specified diseases of liver: Secondary | ICD-10-CM | POA: Diagnosis not present

## 2019-02-20 DIAGNOSIS — D509 Iron deficiency anemia, unspecified: Secondary | ICD-10-CM | POA: Diagnosis not present

## 2019-02-20 DIAGNOSIS — Z992 Dependence on renal dialysis: Secondary | ICD-10-CM | POA: Diagnosis not present

## 2019-02-22 DIAGNOSIS — N2581 Secondary hyperparathyroidism of renal origin: Secondary | ICD-10-CM | POA: Diagnosis not present

## 2019-02-22 DIAGNOSIS — Z992 Dependence on renal dialysis: Secondary | ICD-10-CM | POA: Diagnosis not present

## 2019-02-22 DIAGNOSIS — K7689 Other specified diseases of liver: Secondary | ICD-10-CM | POA: Diagnosis not present

## 2019-02-22 DIAGNOSIS — D509 Iron deficiency anemia, unspecified: Secondary | ICD-10-CM | POA: Diagnosis not present

## 2019-02-22 DIAGNOSIS — N186 End stage renal disease: Secondary | ICD-10-CM | POA: Diagnosis not present

## 2019-02-22 DIAGNOSIS — E119 Type 2 diabetes mellitus without complications: Secondary | ICD-10-CM | POA: Diagnosis not present

## 2019-02-24 DIAGNOSIS — D509 Iron deficiency anemia, unspecified: Secondary | ICD-10-CM | POA: Diagnosis not present

## 2019-02-24 DIAGNOSIS — E119 Type 2 diabetes mellitus without complications: Secondary | ICD-10-CM | POA: Diagnosis not present

## 2019-02-24 DIAGNOSIS — K7689 Other specified diseases of liver: Secondary | ICD-10-CM | POA: Diagnosis not present

## 2019-02-24 DIAGNOSIS — N186 End stage renal disease: Secondary | ICD-10-CM | POA: Diagnosis not present

## 2019-02-24 DIAGNOSIS — N2581 Secondary hyperparathyroidism of renal origin: Secondary | ICD-10-CM | POA: Diagnosis not present

## 2019-02-24 DIAGNOSIS — Z992 Dependence on renal dialysis: Secondary | ICD-10-CM | POA: Diagnosis not present

## 2019-02-26 DIAGNOSIS — K7689 Other specified diseases of liver: Secondary | ICD-10-CM | POA: Diagnosis not present

## 2019-02-26 DIAGNOSIS — N186 End stage renal disease: Secondary | ICD-10-CM | POA: Diagnosis not present

## 2019-02-26 DIAGNOSIS — N2581 Secondary hyperparathyroidism of renal origin: Secondary | ICD-10-CM | POA: Diagnosis not present

## 2019-02-26 DIAGNOSIS — Z992 Dependence on renal dialysis: Secondary | ICD-10-CM | POA: Diagnosis not present

## 2019-02-26 DIAGNOSIS — D509 Iron deficiency anemia, unspecified: Secondary | ICD-10-CM | POA: Diagnosis not present

## 2019-02-26 DIAGNOSIS — E119 Type 2 diabetes mellitus without complications: Secondary | ICD-10-CM | POA: Diagnosis not present

## 2019-03-01 DIAGNOSIS — K7689 Other specified diseases of liver: Secondary | ICD-10-CM | POA: Diagnosis not present

## 2019-03-01 DIAGNOSIS — E119 Type 2 diabetes mellitus without complications: Secondary | ICD-10-CM | POA: Diagnosis not present

## 2019-03-01 DIAGNOSIS — Z992 Dependence on renal dialysis: Secondary | ICD-10-CM | POA: Diagnosis not present

## 2019-03-01 DIAGNOSIS — D509 Iron deficiency anemia, unspecified: Secondary | ICD-10-CM | POA: Diagnosis not present

## 2019-03-01 DIAGNOSIS — N186 End stage renal disease: Secondary | ICD-10-CM | POA: Diagnosis not present

## 2019-03-01 DIAGNOSIS — N2581 Secondary hyperparathyroidism of renal origin: Secondary | ICD-10-CM | POA: Diagnosis not present

## 2019-03-04 DIAGNOSIS — Z992 Dependence on renal dialysis: Secondary | ICD-10-CM | POA: Diagnosis not present

## 2019-03-04 DIAGNOSIS — N2581 Secondary hyperparathyroidism of renal origin: Secondary | ICD-10-CM | POA: Diagnosis not present

## 2019-03-04 DIAGNOSIS — E119 Type 2 diabetes mellitus without complications: Secondary | ICD-10-CM | POA: Diagnosis not present

## 2019-03-04 DIAGNOSIS — D509 Iron deficiency anemia, unspecified: Secondary | ICD-10-CM | POA: Diagnosis not present

## 2019-03-04 DIAGNOSIS — K7689 Other specified diseases of liver: Secondary | ICD-10-CM | POA: Diagnosis not present

## 2019-03-04 DIAGNOSIS — N186 End stage renal disease: Secondary | ICD-10-CM | POA: Diagnosis not present

## 2019-03-05 DIAGNOSIS — F330 Major depressive disorder, recurrent, mild: Secondary | ICD-10-CM | POA: Diagnosis not present

## 2019-03-07 DIAGNOSIS — Z20828 Contact with and (suspected) exposure to other viral communicable diseases: Secondary | ICD-10-CM | POA: Diagnosis not present

## 2019-03-13 ENCOUNTER — Inpatient Hospital Stay (HOSPITAL_COMMUNITY)
Admission: EM | Admit: 2019-03-13 | Discharge: 2019-03-28 | DRG: 177 | Disposition: A | Discharge status: Skilled Nursing Facility | Payer: Medicare Other | Source: Skilled Nursing Facility | Attending: Internal Medicine | Admitting: Internal Medicine

## 2019-03-13 ENCOUNTER — Encounter (HOSPITAL_COMMUNITY): Payer: Self-pay

## 2019-03-13 ENCOUNTER — Other Ambulatory Visit: Payer: Self-pay

## 2019-03-13 DIAGNOSIS — E876 Hypokalemia: Secondary | ICD-10-CM | POA: Diagnosis present

## 2019-03-13 DIAGNOSIS — Z905 Acquired absence of kidney: Secondary | ICD-10-CM

## 2019-03-13 DIAGNOSIS — Z833 Family history of diabetes mellitus: Secondary | ICD-10-CM

## 2019-03-13 DIAGNOSIS — S51811A Laceration without foreign body of right forearm, initial encounter: Secondary | ICD-10-CM

## 2019-03-13 DIAGNOSIS — I953 Hypotension of hemodialysis: Secondary | ICD-10-CM | POA: Diagnosis present

## 2019-03-13 DIAGNOSIS — H543 Unqualified visual loss, both eyes: Secondary | ICD-10-CM | POA: Diagnosis present

## 2019-03-13 DIAGNOSIS — Z993 Dependence on wheelchair: Secondary | ICD-10-CM

## 2019-03-13 DIAGNOSIS — D696 Thrombocytopenia, unspecified: Secondary | ICD-10-CM | POA: Diagnosis present

## 2019-03-13 DIAGNOSIS — R791 Abnormal coagulation profile: Secondary | ICD-10-CM | POA: Diagnosis not present

## 2019-03-13 DIAGNOSIS — Z7901 Long term (current) use of anticoagulants: Secondary | ICD-10-CM

## 2019-03-13 DIAGNOSIS — E785 Hyperlipidemia, unspecified: Secondary | ICD-10-CM | POA: Diagnosis present

## 2019-03-13 DIAGNOSIS — U071 COVID-19: Principal | ICD-10-CM

## 2019-03-13 DIAGNOSIS — I9589 Other hypotension: Secondary | ICD-10-CM | POA: Diagnosis present

## 2019-03-13 DIAGNOSIS — Z951 Presence of aortocoronary bypass graft: Secondary | ICD-10-CM

## 2019-03-13 DIAGNOSIS — E1140 Type 2 diabetes mellitus with diabetic neuropathy, unspecified: Secondary | ICD-10-CM | POA: Diagnosis present

## 2019-03-13 DIAGNOSIS — Z9049 Acquired absence of other specified parts of digestive tract: Secondary | ICD-10-CM

## 2019-03-13 DIAGNOSIS — I2510 Atherosclerotic heart disease of native coronary artery without angina pectoris: Secondary | ICD-10-CM | POA: Diagnosis present

## 2019-03-13 DIAGNOSIS — N186 End stage renal disease: Secondary | ICD-10-CM

## 2019-03-13 DIAGNOSIS — R531 Weakness: Secondary | ICD-10-CM

## 2019-03-13 DIAGNOSIS — M069 Rheumatoid arthritis, unspecified: Secondary | ICD-10-CM | POA: Diagnosis present

## 2019-03-13 DIAGNOSIS — I4430 Unspecified atrioventricular block: Secondary | ICD-10-CM | POA: Diagnosis present

## 2019-03-13 DIAGNOSIS — Z7982 Long term (current) use of aspirin: Secondary | ICD-10-CM

## 2019-03-13 DIAGNOSIS — J1282 Pneumonia due to coronavirus disease 2019: Secondary | ICD-10-CM

## 2019-03-13 DIAGNOSIS — I959 Hypotension, unspecified: Secondary | ICD-10-CM | POA: Diagnosis not present

## 2019-03-13 DIAGNOSIS — I120 Hypertensive chronic kidney disease with stage 5 chronic kidney disease or end stage renal disease: Secondary | ICD-10-CM | POA: Diagnosis present

## 2019-03-13 DIAGNOSIS — L89159 Pressure ulcer of sacral region, unspecified stage: Secondary | ICD-10-CM | POA: Diagnosis present

## 2019-03-13 DIAGNOSIS — W19XXXA Unspecified fall, initial encounter: Secondary | ICD-10-CM

## 2019-03-13 DIAGNOSIS — Z8546 Personal history of malignant neoplasm of prostate: Secondary | ICD-10-CM

## 2019-03-13 DIAGNOSIS — I480 Paroxysmal atrial fibrillation: Secondary | ICD-10-CM | POA: Diagnosis present

## 2019-03-13 DIAGNOSIS — G8929 Other chronic pain: Secondary | ICD-10-CM | POA: Diagnosis present

## 2019-03-13 DIAGNOSIS — E1151 Type 2 diabetes mellitus with diabetic peripheral angiopathy without gangrene: Secondary | ICD-10-CM | POA: Diagnosis present

## 2019-03-13 DIAGNOSIS — D638 Anemia in other chronic diseases classified elsewhere: Secondary | ICD-10-CM | POA: Diagnosis present

## 2019-03-13 DIAGNOSIS — Z85528 Personal history of other malignant neoplasm of kidney: Secondary | ICD-10-CM

## 2019-03-13 DIAGNOSIS — E119 Type 2 diabetes mellitus without complications: Secondary | ICD-10-CM

## 2019-03-13 DIAGNOSIS — H548 Legal blindness, as defined in USA: Secondary | ICD-10-CM | POA: Diagnosis present

## 2019-03-13 DIAGNOSIS — Z992 Dependence on renal dialysis: Secondary | ICD-10-CM

## 2019-03-13 DIAGNOSIS — E1122 Type 2 diabetes mellitus with diabetic chronic kidney disease: Secondary | ICD-10-CM | POA: Diagnosis present

## 2019-03-13 DIAGNOSIS — H409 Unspecified glaucoma: Secondary | ICD-10-CM | POA: Diagnosis present

## 2019-03-13 DIAGNOSIS — D631 Anemia in chronic kidney disease: Secondary | ICD-10-CM | POA: Diagnosis present

## 2019-03-13 DIAGNOSIS — R5381 Other malaise: Secondary | ICD-10-CM | POA: Diagnosis not present

## 2019-03-13 DIAGNOSIS — R001 Bradycardia, unspecified: Secondary | ICD-10-CM | POA: Diagnosis not present

## 2019-03-13 DIAGNOSIS — E1169 Type 2 diabetes mellitus with other specified complication: Secondary | ICD-10-CM

## 2019-03-13 DIAGNOSIS — Z89431 Acquired absence of right foot: Secondary | ICD-10-CM

## 2019-03-13 DIAGNOSIS — J1289 Other viral pneumonia: Secondary | ICD-10-CM | POA: Diagnosis present

## 2019-03-13 DIAGNOSIS — I472 Ventricular tachycardia: Secondary | ICD-10-CM | POA: Diagnosis not present

## 2019-03-13 DIAGNOSIS — Z66 Do not resuscitate: Secondary | ICD-10-CM | POA: Diagnosis present

## 2019-03-13 DIAGNOSIS — Z79899 Other long term (current) drug therapy: Secondary | ICD-10-CM

## 2019-03-13 NOTE — ED Provider Notes (Signed)
[facility] DEPT Provider Note: Georgena Spurling, MD, FACEP  CSN: [id number] MRN: [id number] ARRIVAL: 03/13/19 at 2307 ROOM: [place]  Weakness   HISTORY OF PRESENT ILLNESS  03/13/19 11:47 PM [name] is a 68 y.o. male resident at [facility].  He has a history of end-stage renal disease on dialysis as well as blindness.  He was sent here because he tested positive for Covid and is in need of dialysis tomorrow morning.  He has been feeling weaker than usual and is complaining of an exacerbation of his chronic rheumatoid arthritis pain, notably in his neck, back and legs.  He denies cough or shortness of breath.  He fell earlier today but complains only of a skin tear to his right forearm.  He has a sacral decubitus ulcer which has been exacerbated by soiling himself throughout the day.  He was noted to be hypotensive on arrival but states his blood pressure tends to run in the 03P to 59Y systolic.   Past Medical History:  Diagnosis Date  . Anemia   . Arthritis    "back, neck" (07/28/2014)  . Blind    both eyes removed   . Bruises easily   . CAD, NATIVE VESSEL 01/08/2008      . Cellulitis late 1980's   "hospitalized; wrapped both legs; several times; no OR for this"  . Colon polyps   . Diabetic neuropathy ([place])   . Dialysis patient ([place])   . DM type 2 (diabetes mellitus, type 2) ([place])    no medications (07/28/2014) diet and excersie controlled not onmeds for 14-15 years   . Dysrhythmia    afib   . ESRD (end stage renal disease) on dialysis [facility])    [name]; Mon, Wed, Fri (07/28/2014)  . Fall    hx of fall and required left hip surgery   . Family history of breast cancer    mother  . Heart murmur    hx of  . History of blood product transfusion   . HYPERLIPIDEMIA-MIXED 01/08/2008  . HYPERTENSION 01/27/2009   BP low , hx of HTN, Currently on meds to raise BP  . Hypotension   . Kidney stones   . Low blood pressure   .  OVERWEIGHT/OBESITY 01/08/2008   Lost 205 lbs through diet and exercise.    . Peripheral vascular disease ([place])    vein stripping  . Pneumonia 2000;s X 1  . Prostate cancer ([place])   . Prosthetic eye globe    both eyes  . Renal cell carcinoma 2001 and 2003   "both kidneys"  . Renal insufficiency     Past Surgical History:  Procedure Laterality Date  . AV FISTULA PLACEMENT Left 02/2010   LFA  . AV FISTULA PLACEMENT Left 01/12/2016   Procedure: LEFT BRACHIOCEPHALIC ARTERIOVENOUS (AV) FISTULA CREATION;  Surgeon: Angelia Mould, MD;  Location: [place];  Service: Vascular;  Laterality: Left;  . CHOLECYSTECTOMY OPEN  1992  . COLONOSCOPY  06/14/2011   Procedure: COLONOSCOPY;  Surgeon: [name], MD;  [facility]: [facility];  Service: Endoscopy;  Laterality: N/A;  . COLONOSCOPY N/A 07/24/2012   Procedure: COLONOSCOPY;  Surgeon: [name], MD;  [facility];  Service: Endoscopy;  Laterality: N/A;  . COLONOSCOPY    . COLONOSCOPY N/A 11/04/2015   Procedure: COLONOSCOPY;  Surgeon: Manus Gunning, MD;  Location: [facility];  Service: Gastroenterology;  Laterality: N/A;  . COLONOSCOPY WITH PROPOFOL N/A 05/13/2014  Procedure: COLONOSCOPY WITH PROPOFOL;  Surgeon: [name], MD;  [facility];  Service: Endoscopy;  Laterality: N/A;  . CORONARY ARTERY BYPASS GRAFT  09/29/2011   Procedure: CORONARY ARTERY BYPASS GRAFTING (CABG);  Surgeon: [name], MD;  Location: [facility];  Service: Open Heart Surgery;  Laterality: N/A;  Coronary Artery Bypass Graft times two utilizing the left internal mammary artery and the right greater saphenous vein harvested endoscopically.  . CYSTOSCOPY WITH RETROGRADE PYELOGRAM, URETEROSCOPY AND STENT PLACEMENT Left 07/28/2014   Procedure: CYSTOSCOPY WITH RETROGRADE PYELOGRAM, URETERAL BALLOON DILITATION, URETEROSCOPY AND LEFT STENT PLACEMENT;  Surgeon: [name], MD;  [facility];  Service: Urology;  Laterality: Left;  .  ENUCLEATION  2003; 2006   bilateral; "diabetes; pain"  . FOOT AMPUTATION Right ~ 2002   right;  partial; "infection"  . FRACTURE SURGERY     hip fx  . HIP PINNING,CANNULATED Left 12/31/2013   Procedure: CANNULATED HIP PINNING;  Surgeon: Renette Butters, MD;  Location: [facility];  Service: Orthopedics;  Laterality: Left;  Carm, FX Table, [employer]  . HOT HEMOSTASIS  06/14/2011   Procedure: HOT HEMOSTASIS (ARGON PLASMA COAGULATION/BICAP);  Surgeon: [name], MD;  Location[facility];  Service: Endoscopy;  Laterality: N/A;  . INSERTION OF DIALYSIS CATHETER Right 01/12/2016   Procedure: INSERTION OF DIALYSIS CATHETER;  Surgeon: Angelia Mould, MD;  Location: [facility];  Service: Vascular;  Laterality: Right;  . INSERTION PROSTATE RADIATION SEED  03/2012  . IR GENERIC HISTORICAL  01/13/2016   IR RADIOLOGIST EVAL & MGMT 01/13/2016 Aletta Edouard, MD [facility] INTERV RAD  . IR RADIOLOGIST EVAL & MGMT  03/02/2017  . IR RADIOLOGIST EVAL & MGMT  05/18/2017  . IR RADIOLOGIST EVAL & MGMT  07/27/2017  . LAPAROTOMY N/A 04/07/2016   Procedure: EXPLORATORY LAPAROTOMY AND RESECTION OF RETROPERITONEAL MASS;  Surgeon: Raynelle Bring, MD;  [facility];  Service: Urology;  Laterality: N/A;  . LEFT HEART CATHETERIZATION WITH CORONARY ANGIOGRAM N/A 09/27/2011   Procedure: LEFT HEART CATHETERIZATION WITH CORONARY ANGIOGRAM;  Surgeon: [name], MD;  Location[facility] CATH LAB;  Service: Cardiovascular;  Laterality: N/A;  . LIGATION OF ARTERIOVENOUS  FISTULA Left 01/12/2016   Procedure: LIGATION OF LEFT RADIOCEPHALIC ARTERIOVENOUS  FISTULA;  Surgeon: Angelia Mould, MD;  Location: [place];  Service: Vascular;  Laterality: Left;  . LIGATION OF COMPETING BRANCHES OF ARTERIOVENOUS FISTULA Left 07/29/2014   Procedure: LIGATION OF COMPETING BRANCHES OF LEFT ARM ARTERIOVENOUS FISTULA;  Surgeon: [name], MD;  Location: [place];  Service: Vascular;  Laterality: Left;  . NEPHRECTOMY Right 01/30/2015   done  at [place]  . PARTIAL COLECTOMY Right 04/07/2016   Procedure: RIGHT COLECTOMY AND PARTIAL LIVER RESECTION;  Surgeon: Raynelle Bring, MD;  Location[facility];  Service: Urology;  Laterality: Right;  . RADIOFREQUENCY ABLATION N/A 04/19/2017   Procedure: CT MICROWAVE THERMAL ABLATION;  Surgeon: Aletta Edouard, MD;  Location[facility];  Service: Anesthesiology;  Laterality: N/A;  . RADIOFREQUENCY ABLATION KIDNEY  2010-2012   "twice; one on each side; for cancer"  . REVISON OF ARTERIOVENOUS FISTULA Left 09/01/2015   Procedure: EXPLORATION LEFT LOWER ARM  RADIOCEPHALIC ARTERIOVENOUS FISTULA;  Surgeon: Conrad Yorkana, MD;  Location: [facility];  Service: Vascular;  Laterality: Left;  [name] VARICOSE VEIN SURGERY  mid 1980's    BLE; "knees down; both legs; 2 separate times"    Family History  Problem Relation Age of Onset  . Cancer Mother 26  breast, spine and ovarian  . Heart disease Mother   . Hyperlipidemia Mother   . Hypertension Mother   . Varicose Veins Mother   . Emphysema Father   . Cancer Father 71       kidney, prostate  . Heart disease Father   . Hyperlipidemia Father   . Hypertension Father   . Cancer Sister        ovarian  . Cancer Brother        lung  . Heart disease Brother   . Hyperlipidemia Brother   . Hypertension Brother   . Heart attack Brother   . Peripheral vascular disease Brother   . Cancer Other   . Coronary artery disease Other   . Kidney disease Other   . Breast cancer Other   . Ovarian cancer Other   . Lung cancer Other   . Diabetes Other   . Malignant hyperthermia Neg Hx     Social History   Tobacco Use  . Smoking status: Never Smoker  . Smokeless tobacco: Never Used  Substance Use Topics  . Alcohol use: No    Alcohol/week: 0.0 standard drinks  . Drug use: No    Prior to Admission medications   Medication Sig Start Date End Date Taking? Authorizing Provider  aspirin EC 81 MG tablet Take 81 mg by mouth at bedtime.    [provider]   atorvastatin (LIPITOR) 10 MG tablet Take 10 mg by mouth daily.    [provider]  Methoxy PEG-Epoetin Beta (MIRCERA) 50 MCG/0.3ML SOSY Inject 225 mcg as directed every 14 (fourteen) days.     [provider]  midodrine (PROAMATINE) 10 MG tablet Take 1 tablet (10 mg total) by mouth 3 (three) times daily with meals. 11/05/15   Reyne Dumas, MD  multivitamin (RENA-VIT) TABS tablet Take 1 tablet by mouth daily.    [provider]  pantoprazole (PROTONIX) 40 MG tablet Take 1 tablet (40 mg total) by mouth daily before breakfast. Patient not taking: Reported on 05/29/2018 03/23/18   Rai, Vernelle Emerald, MD  Probiotic Product (ALIGN) 4 MG CAPS Take 1 capsule by mouth daily.    [provider]  sevelamer carbonate (RENVELA) 800 MG tablet Take 2,400-4,800 mg by mouth See admin instructions. Take 6 tablets (4800 mg) by mouth 3 times daily with meals and 3 tablets (2400 mg) 2 times daily with snacks 06/26/14   [provider]    Allergies Codeine and Tape   REVIEW OF SYSTEMS  Negative except as noted here or in the History of Present Illness.   PHYSICAL EXAMINATION  Initial Vital Signs Blood pressure (!) 82/53, pulse (!) 56, temperature 98.1 F (36.7 C), temperature source Oral, resp. rate 16, height 6\' 4"  (1.93 m), weight 98 kg, SpO2 97 %.  Examination General: Well-developed, well-nourished male in no acute distress; appearance consistent with age of record HENT: normocephalic; atraumatic Eyes: Prostheses Neck: supple Heart: regular rate and rhythm Lungs: clear to auscultation bilaterally Abdomen: soft; nondistended; nontender; bowel sounds present Extremities: Right forefoot amputation; pulses weak Neurologic: Awake, alert and oriented; slow, deliberate speech; motor function intact in all extremities and symmetric; no facial droop Skin: Warm and dry; sacral decubitus ulcer; skin tear right forearm Psychiatric: Flat affect   RESULTS  Summary of  this visit's results, reviewed and interpreted by myself:   EKG Interpretation  Date/Time:    Ventricular Rate:    PR Interval:    QRS Duration:   QT Interval:  QTC Calculation:   R Axis:     Text Interpretation:        Laboratory Studies: Results for orders placed or performed during the hospital encounter of 03/13/19 (from the past 24 hour(s))  CBC with Differential     Status: Abnormal   Collection Time: 03/14/19  1:02 AM  Result Value Ref Range   WBC 3.2 (L) 4.0 - 10.5 K/uL   RBC 2.47 (L) 4.22 - 5.81 MIL/uL   Hemoglobin 8.9 (L) 13.0 - 17.0 g/dL   HCT 27.6 (L) 39.0 - 52.0 %   MCV 111.7 (H) 80.0 - 100.0 fL   MCH 36.0 (H) 26.0 - 34.0 pg   MCHC 32.2 30.0 - 36.0 g/dL   RDW 14.8 11.5 - 15.5 %   Platelets 64 (L) 150 - 400 K/uL   nRBC 0.0 0.0 - 0.2 %   Neutrophils Relative % 84 %   Neutro Abs 2.7 1.7 - 7.7 K/uL   Lymphocytes Relative 5 %   Lymphs Abs 0.2 (L) 0.7 - 4.0 K/uL   Monocytes Relative 10 %   Monocytes Absolute 0.3 0.1 - 1.0 K/uL   Eosinophils Relative 0 %   Eosinophils Absolute 0.0 0.0 - 0.5 K/uL   Basophils Relative 0 %   Basophils Absolute 0.0 0.0 - 0.1 K/uL   Immature Granulocytes 1 %   Abs Immature Granulocytes 0.02 0.00 - 0.07 K/uL  Basic metabolic panel     Status: Abnormal   Collection Time: 03/14/19  1:02 AM  Result Value Ref Range   Sodium 142 135 - 145 mmol/L   Potassium 2.9 (L) 3.5 - 5.1 mmol/L   Chloride 97 (L) 98 - 111 mmol/L   CO2 27 22 - 32 mmol/L   Glucose, Bld 77 70 - 99 mg/dL   BUN 51 (H) 8 - 23 mg/dL   Creatinine, Ser 6.74 (H) 0.61 - 1.24 mg/dL   Calcium 8.0 (L) 8.9 - 10.3 mg/dL   GFR calc non Af Amer 8 (L) >60 mL/min   GFR calc Af Amer 9 (L) >60 mL/min   Anion gap 18 (H) 5 - 15  ALT     Status: None   Collection Time: 03/14/19  7:00 AM  Result Value Ref Range   ALT 15 0 - 44 U/L   Imaging Studies: No results found.  ED COURSE and MDM  Nursing notes, initial and subsequent vitals signs, including pulse oximetry, reviewed and  interpreted by myself.  Vitals:   03/14/19 0600 03/14/19 0643 03/14/19 0709 03/14/19 0730  BP: (!) 72/41 (!) 69/50 (!) 79/48 (!) 84/48  Pulse: (!) 55 (!) 56 (!) 57 (!) 56  Resp: 17 15 (!) 23 16  Temp:      TempSrc:      SpO2: 99% 99% 95% 93%  Weight:      Height:       Hospitalist service will admit patient for generalized weakness secondary to COVID-19 infection.  They will arrange for him to get dialysis as well.  PROCEDURES  Procedures   ED DIAGNOSES     ICD-10-CM   1. COVID-19 virus infection  U07.1   2. Generalized weakness  R53.1   3. Fall at nursing home, initial encounter  W19.XXXA    Y92.129   4. Skin tear of right forearm without complication, initial encounter  T55.732K        Shanon Rosser, MD 03/14/19 [phone]

## 2019-03-13 NOTE — ED Triage Notes (Signed)
Per EMS, pt is from [facility]. Staff called EMS due to patient's positive covid result and need of dialysis. Pt states he had dialysis yesterday and is due again tomorrow morning. Patient c/o sacral pain. Pt denies SHOB. Per EMS, pt is a&ox4.

## 2019-03-14 ENCOUNTER — Observation Stay (HOSPITAL_COMMUNITY): Payer: Medicare Other

## 2019-03-14 DIAGNOSIS — E876 Hypokalemia: Secondary | ICD-10-CM | POA: Diagnosis present

## 2019-03-14 DIAGNOSIS — Z9049 Acquired absence of other specified parts of digestive tract: Secondary | ICD-10-CM | POA: Diagnosis not present

## 2019-03-14 DIAGNOSIS — I480 Paroxysmal atrial fibrillation: Secondary | ICD-10-CM | POA: Diagnosis not present

## 2019-03-14 DIAGNOSIS — Z905 Acquired absence of kidney: Secondary | ICD-10-CM | POA: Diagnosis not present

## 2019-03-14 DIAGNOSIS — D696 Thrombocytopenia, unspecified: Secondary | ICD-10-CM

## 2019-03-14 DIAGNOSIS — W19XXXA Unspecified fall, initial encounter: Secondary | ICD-10-CM | POA: Diagnosis not present

## 2019-03-14 DIAGNOSIS — Z79899 Other long term (current) drug therapy: Secondary | ICD-10-CM | POA: Diagnosis not present

## 2019-03-14 DIAGNOSIS — Z992 Dependence on renal dialysis: Secondary | ICD-10-CM | POA: Diagnosis not present

## 2019-03-14 DIAGNOSIS — Z66 Do not resuscitate: Secondary | ICD-10-CM | POA: Diagnosis present

## 2019-03-14 DIAGNOSIS — I953 Hypotension of hemodialysis: Secondary | ICD-10-CM

## 2019-03-14 DIAGNOSIS — E119 Type 2 diabetes mellitus without complications: Secondary | ICD-10-CM | POA: Diagnosis not present

## 2019-03-14 DIAGNOSIS — I472 Ventricular tachycardia: Secondary | ICD-10-CM | POA: Diagnosis not present

## 2019-03-14 DIAGNOSIS — Z7982 Long term (current) use of aspirin: Secondary | ICD-10-CM | POA: Diagnosis not present

## 2019-03-14 DIAGNOSIS — I120 Hypertensive chronic kidney disease with stage 5 chronic kidney disease or end stage renal disease: Secondary | ICD-10-CM | POA: Diagnosis present

## 2019-03-14 DIAGNOSIS — J1289 Other viral pneumonia: Secondary | ICD-10-CM | POA: Diagnosis present

## 2019-03-14 DIAGNOSIS — U071 COVID-19: Secondary | ICD-10-CM | POA: Diagnosis not present

## 2019-03-14 DIAGNOSIS — Y92129 Unspecified place in nursing home as the place of occurrence of the external cause: Secondary | ICD-10-CM

## 2019-03-14 DIAGNOSIS — I2510 Atherosclerotic heart disease of native coronary artery without angina pectoris: Secondary | ICD-10-CM | POA: Diagnosis present

## 2019-03-14 DIAGNOSIS — N250 Renal osteodystrophy: Secondary | ICD-10-CM | POA: Diagnosis not present

## 2019-03-14 DIAGNOSIS — E1122 Type 2 diabetes mellitus with diabetic chronic kidney disease: Secondary | ICD-10-CM | POA: Diagnosis not present

## 2019-03-14 DIAGNOSIS — S51811A Laceration without foreign body of right forearm, initial encounter: Secondary | ICD-10-CM

## 2019-03-14 DIAGNOSIS — N186 End stage renal disease: Secondary | ICD-10-CM | POA: Diagnosis not present

## 2019-03-14 DIAGNOSIS — H543 Unqualified visual loss, both eyes: Secondary | ICD-10-CM | POA: Diagnosis not present

## 2019-03-14 DIAGNOSIS — M069 Rheumatoid arthritis, unspecified: Secondary | ICD-10-CM | POA: Diagnosis present

## 2019-03-14 DIAGNOSIS — D638 Anemia in other chronic diseases classified elsewhere: Secondary | ICD-10-CM | POA: Diagnosis present

## 2019-03-14 DIAGNOSIS — I9589 Other hypotension: Secondary | ICD-10-CM | POA: Diagnosis present

## 2019-03-14 DIAGNOSIS — D631 Anemia in chronic kidney disease: Secondary | ICD-10-CM | POA: Diagnosis not present

## 2019-03-14 DIAGNOSIS — E1100 Type 2 diabetes mellitus with hyperosmolarity without nonketotic hyperglycemic-hyperosmolar coma (NKHHC): Secondary | ICD-10-CM | POA: Diagnosis not present

## 2019-03-14 DIAGNOSIS — E1151 Type 2 diabetes mellitus with diabetic peripheral angiopathy without gangrene: Secondary | ICD-10-CM | POA: Diagnosis present

## 2019-03-14 DIAGNOSIS — Z89431 Acquired absence of right foot: Secondary | ICD-10-CM | POA: Diagnosis not present

## 2019-03-14 DIAGNOSIS — Z8546 Personal history of malignant neoplasm of prostate: Secondary | ICD-10-CM | POA: Diagnosis not present

## 2019-03-14 DIAGNOSIS — R531 Weakness: Secondary | ICD-10-CM

## 2019-03-14 DIAGNOSIS — Z833 Family history of diabetes mellitus: Secondary | ICD-10-CM | POA: Diagnosis not present

## 2019-03-14 DIAGNOSIS — E8770 Fluid overload, unspecified: Secondary | ICD-10-CM | POA: Diagnosis not present

## 2019-03-14 DIAGNOSIS — Z951 Presence of aortocoronary bypass graft: Secondary | ICD-10-CM | POA: Diagnosis not present

## 2019-03-14 DIAGNOSIS — H548 Legal blindness, as defined in USA: Secondary | ICD-10-CM | POA: Diagnosis not present

## 2019-03-14 DIAGNOSIS — E785 Hyperlipidemia, unspecified: Secondary | ICD-10-CM | POA: Diagnosis present

## 2019-03-14 DIAGNOSIS — R001 Bradycardia, unspecified: Secondary | ICD-10-CM | POA: Diagnosis not present

## 2019-03-14 LAB — ALT: ALT: 15 U/L (ref 0–44)

## 2019-03-14 LAB — CBC WITH DIFFERENTIAL/PLATELET
Abs Immature Granulocytes: 0.02 10*3/uL (ref 0.00–0.07)
Basophils Absolute: 0 10*3/uL (ref 0.0–0.1)
Basophils Relative: 0 %
Eosinophils Absolute: 0 10*3/uL (ref 0.0–0.5)
Eosinophils Relative: 0 %
HCT: 27.6 % — ABNORMAL LOW (ref 39.0–52.0)
Hemoglobin: 8.9 g/dL — ABNORMAL LOW (ref 13.0–17.0)
Immature Granulocytes: 1 %
Lymphocytes Relative: 5 %
Lymphs Abs: 0.2 10*3/uL — ABNORMAL LOW (ref 0.7–4.0)
MCH: 36 pg — ABNORMAL HIGH (ref 26.0–34.0)
MCHC: 32.2 g/dL (ref 30.0–36.0)
MCV: 111.7 fL — ABNORMAL HIGH (ref 80.0–100.0)
Monocytes Absolute: 0.3 10*3/uL (ref 0.1–1.0)
Monocytes Relative: 10 %
Neutro Abs: 2.7 10*3/uL (ref 1.7–7.7)
Neutrophils Relative %: 84 %
Platelets: 64 10*3/uL — ABNORMAL LOW (ref 150–400)
RBC: 2.47 MIL/uL — ABNORMAL LOW (ref 4.22–5.81)
RDW: 14.8 % (ref 11.5–15.5)
WBC: 3.2 10*3/uL — ABNORMAL LOW (ref 4.0–10.5)
nRBC: 0 % (ref 0.0–0.2)

## 2019-03-14 LAB — BASIC METABOLIC PANEL
Anion gap: 18 — ABNORMAL HIGH (ref 5–15)
BUN: 51 mg/dL — ABNORMAL HIGH (ref 8–23)
CO2: 27 mmol/L (ref 22–32)
Calcium: 8 mg/dL — ABNORMAL LOW (ref 8.9–10.3)
Chloride: 97 mmol/L — ABNORMAL LOW (ref 98–111)
Creatinine, Ser: 6.74 mg/dL — ABNORMAL HIGH (ref 0.61–1.24)
GFR calc Af Amer: 9 mL/min — ABNORMAL LOW (ref >60)
GFR calc non Af Amer: 8 mL/min — ABNORMAL LOW (ref >60)
Glucose, Bld: 77 mg/dL (ref 70–99)
Potassium: 2.9 mmol/L — ABNORMAL LOW (ref 3.5–5.1)
Sodium: 142 mmol/L (ref 135–145)

## 2019-03-14 LAB — FERRITIN: Ferritin: 3917 ng/mL — ABNORMAL HIGH (ref 24–336)

## 2019-03-14 LAB — D-DIMER, QUANTITATIVE: D-Dimer, Quant: 3.54 ug/mL-FEU — ABNORMAL HIGH (ref 0.00–0.50)

## 2019-03-14 LAB — HEPATIC FUNCTION PANEL
ALT: 16 U/L (ref 0–44)
AST: 23 U/L (ref 15–41)
Albumin: 2.8 g/dL — ABNORMAL LOW (ref 3.5–5.0)
Alkaline Phosphatase: 76 U/L (ref 38–126)
Bilirubin, Direct: 0.2 mg/dL (ref 0.0–0.2)
Indirect Bilirubin: 0.7 mg/dL (ref 0.3–0.9)
Total Bilirubin: 0.9 mg/dL (ref 0.3–1.2)
Total Protein: 5.5 g/dL — ABNORMAL LOW (ref 6.5–8.1)

## 2019-03-14 LAB — C-REACTIVE PROTEIN: CRP: 16.2 mg/dL — ABNORMAL HIGH (ref <1.0)

## 2019-03-14 LAB — LACTIC ACID, PLASMA: Lactic Acid, Venous: 0.9 mmol/L (ref 0.5–1.9)

## 2019-03-14 LAB — ABO/RH: ABO/RH(D): A POS

## 2019-03-14 LAB — PROCALCITONIN: Procalcitonin: 2.18 ng/mL

## 2019-03-14 MED ORDER — RENA-VITE PO TABS
1.0000 | ORAL_TABLET | Freq: Every day | ORAL | Status: DC
  Administered 2019-03-15 – 2019-03-18 (×4): 1 via ORAL
  Filled 2019-03-14 (×5): qty 1

## 2019-03-14 MED ORDER — ONDANSETRON HCL 4 MG PO TABS: 4.0000 mg | ORAL_TABLET | Freq: Four times a day (QID) | ORAL | Status: DC | PRN

## 2019-03-14 MED ORDER — SODIUM CHLORIDE 0.9 % IV SOLN
500.0000 mg | INTRAVENOUS | Status: DC
  Administered 2019-03-14 – 2019-03-16 (×3): 500 mg via INTRAVENOUS
  Filled 2019-03-14 (×4): qty 500

## 2019-03-14 MED ORDER — ATORVASTATIN CALCIUM 10 MG PO TABS
10.0000 mg | ORAL_TABLET | Freq: Every day | ORAL | Status: DC
  Administered 2019-03-15 – 2019-03-27 (×13): 10 mg via ORAL
  Filled 2019-03-14 (×13): qty 1

## 2019-03-14 MED ORDER — SODIUM CHLORIDE 0.9 % IV SOLN
INTRAVENOUS | Status: DC | PRN
  Administered 2019-03-14: 09:00:00 1000 mL via INTRAVENOUS

## 2019-03-14 MED ORDER — MIDODRINE HCL 5 MG PO TABS
10.0000 mg | ORAL_TABLET | Freq: Three times a day (TID) | ORAL | Status: DC
  Administered 2019-03-14 – 2019-03-18 (×13): 10 mg via ORAL
  Filled 2019-03-14 (×15): qty 2

## 2019-03-14 MED ORDER — MIDODRINE HCL 5 MG PO TABS: 10.0000 mg | ORAL_TABLET | Freq: Three times a day (TID) | ORAL | Status: DC

## 2019-03-14 MED ORDER — SODIUM CHLORIDE 0.9 % IV SOLN
200.0000 mg | Freq: Once | INTRAVENOUS | Status: AC
  Administered 2019-03-14: 09:00:00 200 mg via INTRAVENOUS
  Filled 2019-03-14: qty 200

## 2019-03-14 MED ORDER — ONDANSETRON HCL 4 MG/2ML IJ SOLN
4.0000 mg | Freq: Four times a day (QID) | INTRAMUSCULAR | Status: DC | PRN
  Filled 2019-03-14: qty 2

## 2019-03-14 MED ORDER — ALBUTEROL SULFATE HFA 108 (90 BASE) MCG/ACT IN AERS
2.0000 | INHALATION_SPRAY | Freq: Four times a day (QID) | RESPIRATORY_TRACT | Status: DC
  Administered 2019-03-14 – 2019-03-24 (×39): 2 via RESPIRATORY_TRACT
  Filled 2019-03-14 (×3): qty 6.7

## 2019-03-14 MED ORDER — SODIUM CHLORIDE 0.9 % IV BOLUS
500.0000 mL | Freq: Once | INTRAVENOUS | Status: AC
  Administered 2019-03-14: 09:00:00 500 mL via INTRAVENOUS

## 2019-03-14 MED ORDER — CHLORHEXIDINE GLUCONATE CLOTH 2 % EX PADS
6.0000 | MEDICATED_PAD | Freq: Every day | CUTANEOUS | Status: DC
  Administered 2019-03-15 – 2019-03-23 (×7): 6 via TOPICAL

## 2019-03-14 MED ORDER — HEPARIN SODIUM (PORCINE) 5000 UNIT/ML IJ SOLN
5000.0000 [IU] | Freq: Three times a day (TID) | INTRAMUSCULAR | Status: AC
  Administered 2019-03-15 – 2019-03-17 (×7): 5000 [IU] via SUBCUTANEOUS
  Filled 2019-03-14 (×7): qty 1

## 2019-03-14 MED ORDER — POTASSIUM CHLORIDE CRYS ER 20 MEQ PO TBCR
40.0000 meq | EXTENDED_RELEASE_TABLET | Freq: Once | ORAL | Status: AC
  Administered 2019-03-14: 11:00:00 40 meq via ORAL
  Filled 2019-03-14: qty 2

## 2019-03-14 MED ORDER — HEPARIN SODIUM (PORCINE) 5000 UNIT/ML IJ SOLN
5000.0000 [IU] | Freq: Three times a day (TID) | INTRAMUSCULAR | Status: DC
  Filled 2019-03-14 (×2): qty 1

## 2019-03-14 MED ORDER — SODIUM CHLORIDE 0.9 % IV SOLN
1.0000 g | INTRAVENOUS | Status: AC
  Administered 2019-03-14 – 2019-03-18 (×5): 1 g via INTRAVENOUS
  Filled 2019-03-14 (×5): qty 10

## 2019-03-14 MED ORDER — SEVELAMER CARBONATE 800 MG PO TABS
4800.0000 mg | ORAL_TABLET | Freq: Three times a day (TID) | ORAL | Status: DC
  Administered 2019-03-15 – 2019-03-28 (×23): 4800 mg via ORAL
  Filled 2019-03-14 (×39): qty 6

## 2019-03-14 MED ORDER — ACETAMINOPHEN 325 MG PO TABS
650.0000 mg | ORAL_TABLET | Freq: Four times a day (QID) | ORAL | Status: DC | PRN
  Administered 2019-03-15 – 2019-03-19 (×4): 650 mg via ORAL
  Filled 2019-03-14 (×4): qty 2

## 2019-03-14 MED ORDER — SODIUM CHLORIDE 0.9 % IV SOLN
100.0000 mg | Freq: Every day | INTRAVENOUS | Status: AC
  Administered 2019-03-15 – 2019-03-18 (×4): 100 mg via INTRAVENOUS
  Filled 2019-03-14 (×4): qty 20

## 2019-03-14 NOTE — ED Notes (Signed)
Dr. Florina Ou EDP made aware of patient's low blood pressure. Will continue to monitor.

## 2019-03-14 NOTE — ED Notes (Signed)
Pt alert, AOx4 at this time.  Pt c/o moderate neck pain.  Pt repositioned for comfort.  Will continue to monitor.

## 2019-03-14 NOTE — H&P (Signed)
History and Physical    [name] ZES:[id number] DOB: [birth year] DOA: 03/13/2019  PCP: [name], MD  Patient coming from: ALF  I have personally briefly reviewed patient's old medical records in [place]  Chief Complaint: Generalized weakness, COVID  HPI: [name] is a 68 y.o. male with medical history significant of ESRD on dialysis TTS, B blindness, chronic hypotension on daily midodrine.  He was sent to the ED because he has generalized weakness, is unable to get up out of bed, tested positive for COVID, and is due for dialysis today.  Denies cough or SOB.  Fell earlier yesterday, only injury seems to be a skin tear to R fore arm.   ED Course: Hypotensive in ED with SBPs in the 70s and 80s, but patient reports to EDP as well as myself that this is baseline for him, and his BP always runs in the 70s and 80s.  Certainly mentation doesn't appear to be adversely affected by low BP.   Review of Systems: As per HPI, otherwise all review of systems negative.  Past Medical History:  Diagnosis Date  . Anemia   . Arthritis    "back, neck" (07/28/2014)  . Blind    both eyes removed   . Bruises easily   . CAD, NATIVE VESSEL 01/08/2008      . Cellulitis late 1980's   "hospitalized; wrapped both legs; several times; no OR for this"  . Colon polyps   . Diabetic neuropathy ([place])   . Dialysis patient ([place])   . DM type 2 (diabetes mellitus, type 2) ([place])    no medications (07/28/2014) diet and excersie controlled not onmeds for 14-15 years   . Dysrhythmia    afib   . ESRD (end stage renal disease) on dialysis [facility])    [name]; Mon, Wed, Fri (07/28/2014)  . Fall    hx of fall and required left hip surgery   . Family history of breast cancer    mother  . Heart murmur    hx of  . History of blood product transfusion   . HYPERLIPIDEMIA-MIXED 01/08/2008  . HYPERTENSION 01/27/2009   BP low , hx of HTN, Currently on meds to raise BP  . Hypotension   .  Kidney stones   . Low blood pressure   . OVERWEIGHT/OBESITY 01/08/2008   Lost 205 lbs through diet and exercise.    . Peripheral vascular disease ([place])    vein stripping  . Pneumonia 2000;s X 1  . Prostate cancer ([place])   . Prosthetic eye globe    both eyes  . Renal cell carcinoma 2001 and 2003   "both kidneys"  . Renal insufficiency     Past Surgical History:  Procedure Laterality Date  . AV FISTULA PLACEMENT Left 02/2010   LFA  . AV FISTULA PLACEMENT Left 01/12/2016   Procedure: LEFT BRACHIOCEPHALIC ARTERIOVENOUS (AV) FISTULA CREATION;  Surgeon: Angelia Mould, MD;  Location: [place];  Service: Vascular;  Laterality: Left;  . CHOLECYSTECTOMY OPEN  1992  . COLONOSCOPY  06/14/2011   Procedure: COLONOSCOPY;  Surgeon: [name], MD;  [facility]: [facility];  Service: Endoscopy;  Laterality: N/A;  . COLONOSCOPY N/A 07/24/2012   Procedure: COLONOSCOPY;  Surgeon: [name], MD;  [facility];  Service: Endoscopy;  Laterality: N/A;  . COLONOSCOPY    . COLONOSCOPY N/A 11/04/2015   Procedure: COLONOSCOPY;  Surgeon: Manus Gunning, MD;  Location[facility]COPY;  Service: Gastroenterology;  Laterality: N/A;  . COLONOSCOPY WITH PROPOFOL N/A 05/13/2014   Procedure: COLONOSCOPY WITH PROPOFOL;  Surgeon: [name], MD;  [facility];  Service: Endoscopy;  Laterality: N/A;  . CORONARY ARTERY BYPASS GRAFT  09/29/2011   Procedure: CORONARY ARTERY BYPASS GRAFTING (CABG);  Surgeon: [name], MD;  Location: [place];  Service: Open Heart Surgery;  Laterality: N/A;  Coronary Artery Bypass Graft times two utilizing the left internal mammary artery and the right greater saphenous vein harvested endoscopically.  . CYSTOSCOPY WITH RETROGRADE PYELOGRAM, URETEROSCOPY AND STENT PLACEMENT Left 07/28/2014   Procedure: CYSTOSCOPY WITH RETROGRADE PYELOGRAM, URETERAL BALLOON DILITATION, URETEROSCOPY AND LEFT STENT PLACEMENT;  Surgeon: [name], MD;  [facility];   Service: Urology;  Laterality: Left;  . ENUCLEATION  2003; 2006   bilateral; "diabetes; pain"  . FOOT AMPUTATION Right ~ 2002   right;  partial; "infection"  . FRACTURE SURGERY     hip fx  . HIP PINNING,CANNULATED Left 12/31/2013   Procedure: CANNULATED HIP PINNING;  Surgeon: Renette Butters, MD;  Location: [place];  Service: Orthopedics;  Laterality: Left;  Carm, FX Table, [employer]  . HOT HEMOSTASIS  06/14/2011   Procedure: HOT HEMOSTASIS (ARGON PLASMA COAGULATION/BICAP);  Surgeon: [name], MD;  Location: [facility];  Service: Endoscopy;  Laterality: N/A;  . INSERTION OF DIALYSIS CATHETER Right 01/12/2016   Procedure: INSERTION OF DIALYSIS CATHETER;  Surgeon: Angelia Mould, MD;  Location: [place];  Service: Vascular;  Laterality: Right;  . INSERTION PROSTATE RADIATION SEED  03/2012  . IR GENERIC HISTORICAL  01/13/2016   IR RADIOLOGIST EVAL & MGMT 01/13/2016 Aletta Edouard, MD [facility] INTERV RAD  . IR RADIOLOGIST EVAL & MGMT  03/02/2017  . IR RADIOLOGIST EVAL & MGMT  05/18/2017  . IR RADIOLOGIST EVAL & MGMT  07/27/2017  . LAPAROTOMY N/A 04/07/2016   Procedure: EXPLORATORY LAPAROTOMY AND RESECTION OF RETROPERITONEAL MASS;  Surgeon: Raynelle Bring, MD;  [facility];  Service: Urology;  Laterality: N/A;  . LEFT HEART CATHETERIZATION WITH CORONARY ANGIOGRAM N/A 09/27/2011   Procedure: LEFT HEART CATHETERIZATION WITH CORONARY ANGIOGRAM;  Surgeon: [name], MD;  Location: [facility] CATH LAB;  Service: Cardiovascular;  Laterality: N/A;  . LIGATION OF ARTERIOVENOUS  FISTULA Left 01/12/2016   Procedure: LIGATION OF LEFT RADIOCEPHALIC ARTERIOVENOUS  FISTULA;  Surgeon: Angelia Mould, MD;  Location: [place];  Service: Vascular;  Laterality: Left;  . LIGATION OF COMPETING BRANCHES OF ARTERIOVENOUS FISTULA Left 07/29/2014   Procedure: LIGATION OF COMPETING BRANCHES OF LEFT ARM ARTERIOVENOUS FISTULA;  Surgeon: [name], MD;  Location: [place];  Service: Vascular;  Laterality: Left;   . NEPHRECTOMY Right 01/30/2015   done at [place]  . PARTIAL COLECTOMY Right 04/07/2016   Procedure: RIGHT COLECTOMY AND PARTIAL LIVER RESECTION;  Surgeon: Raynelle Bring, MD;  [facility];  Service: Urology;  Laterality: Right;  . RADIOFREQUENCY ABLATION N/A 04/19/2017   Procedure: CT MICROWAVE THERMAL ABLATION;  Surgeon: Aletta Edouard, MD;  [facility];  Service: Anesthesiology;  Laterality: N/A;  . RADIOFREQUENCY ABLATION KIDNEY  2010-2012   "twice; one on each side; for cancer"  . REVISON OF ARTERIOVENOUS FISTULA Left 09/01/2015   Procedure: EXPLORATION LEFT LOWER ARM  RADIOCEPHALIC ARTERIOVENOUS FISTULA;  Surgeon: Conrad Badger, MD;  Location: [place];  Service: Vascular;  Laterality: Left;  [name] VARICOSE VEIN SURGERY  mid 1980's    BLE; "knees down; both legs; 2 separate times"     reports that he  has never smoked. He has never used smokeless tobacco. He reports that he does not drink alcohol or use drugs.  Allergies  Allergen Reactions  . Codeine Other (See Comments)     [place]  . Tape Other (See Comments)    Plastic tape tears skin off, please use paper tape instead.    Family History  Problem Relation Age of Onset  . Cancer Mother 76       breast, spine and ovarian  . Heart disease Mother   . Hyperlipidemia Mother   . Hypertension Mother   . Varicose Veins Mother   . Emphysema Father   . Cancer Father 31       kidney, prostate  . Heart disease Father   . Hyperlipidemia Father   . Hypertension Father   . Cancer Sister        ovarian  . Cancer Brother        lung  . Heart disease Brother   . Hyperlipidemia Brother   . Hypertension Brother   . Heart attack Brother   . Peripheral vascular disease Brother   . Cancer Other   . Coronary artery disease Other   . Kidney disease Other   . Breast cancer Other   . Ovarian cancer Other   . Lung cancer Other   . Diabetes Other   . Malignant hyperthermia Neg Hx      Prior to Admission  medications   Medication Sig Start Date End Date Taking? Authorizing Provider  aspirin EC 81 MG tablet Take 81 mg by mouth at bedtime.    [provider]  atorvastatin (LIPITOR) 10 MG tablet Take 10 mg by mouth daily.    [provider]  Methoxy PEG-Epoetin Beta (MIRCERA) 50 MCG/0.3ML SOSY Inject 225 mcg as directed every 14 (fourteen) days.     [provider]  midodrine (PROAMATINE) 10 MG tablet Take 1 tablet (10 mg total) by mouth 3 (three) times daily with meals. 11/05/15   Reyne Dumas, MD  multivitamin (RENA-VIT) TABS tablet Take 1 tablet by mouth daily.    [provider]  pantoprazole (PROTONIX) 40 MG tablet Take 1 tablet (40 mg total) by mouth daily before breakfast. Patient not taking: Reported on 05/29/2018 03/23/18   Rai, Vernelle Emerald, MD  Probiotic Product (ALIGN) 4 MG CAPS Take 1 capsule by mouth daily.    [provider]  sevelamer carbonate (RENVELA) 800 MG tablet Take 2,400-4,800 mg by mouth See admin instructions. Take 6 tablets (4800 mg) by mouth 3 times daily with meals and 3 tablets (2400 mg) 2 times daily with snacks 06/26/14   [provider]    Physical Exam: Vitals:   03/14/19 0400 03/14/19 0430 03/14/19 0500 03/14/19 0600  BP: (!) 82/52 (!) 91/53 (!) 86/50 (!) 72/41  Pulse: (!) 56 (!) 57 (!) 55 (!) 55  Resp: 16 16 17 17   Temp:      TempSrc:      SpO2: 97% 97% 94% 99%  Weight:      Height:        Constitutional: NAD, calm, comfortable Eyes: Prosthetic ENMT: Mucous membranes are moist. Posterior pharynx clear of any exudate or lesions.Normal dentition.  Neck: normal, supple, no masses, no thyromegaly Respiratory: clear to auscultation bilaterally, no wheezing, no crackles. Normal respiratory effort. No accessory muscle use.  Cardiovascular: Regular rate and rhythm, no murmurs / rubs / gallops. No extremity edema. 2+ pedal pulses. No carotid bruits.  Abdomen: no  tenderness, no masses palpated. No  hepatosplenomegaly. Bowel sounds positive.  Musculoskeletal: no clubbing / cyanosis. No joint deformity upper and lower extremities. Good ROM, no contractures. Normal muscle tone.  Skin: Sacral decubitus, skin tear R forearm Neurologic: CN 2-12 grossly intact. Sensation intact, DTR normal. Strength 5/5 in all 4.  Psychiatric: Normal judgment and insight. Alert and oriented x 3. Flat affect   Labs on Admission: I have personally reviewed following labs and imaging studies  CBC: Recent Labs  Lab 03/14/19 0102  WBC 3.2*  NEUTROABS 2.7  HGB 8.9*  HCT 27.6*  MCV 111.7*  PLT 64*   Basic Metabolic Panel: Recent Labs  Lab 03/14/19 0102  NA 142  K 2.9*  CL 97*  CO2 27  GLUCOSE 77  BUN 51*  CREATININE 6.74*  CALCIUM 8.0*   GFR: Estimated Creatinine Clearance: 12.7 mL/min (A) (by C-G formula based on SCr of 6.74 mg/dL (H)). Liver Function Tests: No results for input(s): AST, ALT, ALKPHOS, BILITOT, PROT, ALBUMIN in the last 168 hours. No results for input(s): LIPASE, AMYLASE in the last 168 hours. No results for input(s): AMMONIA in the last 168 hours. Coagulation Profile: No results for input(s): INR, PROTIME in the last 168 hours. Cardiac Enzymes: No results for input(s): CKTOTAL, CKMB, CKMBINDEX, TROPONINI in the last 168 hours. BNP (last 3 results) No results for input(s): PROBNP in the last 8760 hours. HbA1C: No results for input(s): HGBA1C in the last 72 hours. CBG: No results for input(s): GLUCAP in the last 168 hours. Lipid Profile: No results for input(s): CHOL, HDL, LDLCALC, TRIG, CHOLHDL, LDLDIRECT in the last 72 hours. Thyroid Function Tests: No results for input(s): TSH, T4TOTAL, FREET4, T3FREE, THYROIDAB in the last 72 hours. Anemia Panel: No results for input(s): VITAMINB12, FOLATE, FERRITIN, TIBC, IRON, RETICCTPCT in the last 72 hours. Urine analysis:    Component Value Date/Time   COLORURINE YELLOW 11/01/2015 1917   APPEARANCEUR CLOUDY (A) 11/01/2015  1917   LABSPEC 1.012 11/01/2015 1917   PHURINE 7.0 11/01/2015 1917   GLUCOSEU NEGATIVE 11/01/2015 1917   HGBUR SMALL (A) 11/01/2015 1917   BILIRUBINUR NEGATIVE 11/01/2015 1917   KETONESUR NEGATIVE 11/01/2015 1917   PROTEINUR 100 (A) 11/01/2015 1917   UROBILINOGEN 0.2 07/28/2014 1007   NITRITE NEGATIVE 11/01/2015 1917   LEUKOCYTESUR LARGE (A) 11/01/2015 1917    Radiological Exams on Admission: No results found.  EKG: Independently reviewed.  Assessment/Plan Principal Problem:   COVID-19 virus infection Active Problems:   ESRD (end stage renal disease) on dialysis (HCC)   Hypotension of hemodialysis   Thrombocytopenia (HCC)   DM type 2 (diabetes mellitus, type 2) (HCC)   Bilateral blindness   Generalized weakness    1. COVID-19 - 1. Reportedly patient COVID positive, though only symptom is generalized weakness. 2. Confirm this with COVID test 1. If positive, then remdesivir per pharm consult 3. Daily labs 4. Check procalcitonin 5. Check lactate 2. ESRD - 1. No emergent HD needs based on BMP, though due for routine dialysis today. 2. Call nephrology in AM, transferring to [facility] 3. Hypotension - 1. Patient tells me and EDP that his BP always runs low "76P and 95K systolic" 2. He does take TID midodrine at baseline it looks like 3. Seems to be mentating okay despite low blood pressure 4. Not tachycardic as I would expect with a low volume cause of hypotension. 5. I think some of the readings are artifactual as he is laying on his left side with the BP cuff  on the right arm. 6. Plan to restart home midodrine with first dose now. 4. DM2 - 1. Chart diagnosis, doesn't appear to be on any home meds for this, CBG in the 70s. 5. B blindness - chronic 6. Thrombocytopenia - chronic  DVT prophylaxis: SCDs - has chronic thrombocytopenia Code Status: Full Family Communication: No family in room Disposition Plan: TBD Consults called: None, call nephro in AM Admission status: Place  in 47    Kimiah Hibner, [place] Hospitalists  How to contact the [facility] Attending or Consulting provider [place] or covering provider during after hours [place], for this patient?  1. Check the care team in [facility] and look for a) attending/consulting [facility] provider listed and b) the [facility] team listed 2. Log into [email or web address]  Amion Physician Scheduling and messaging for groups and whole hospitals  On call and physician scheduling software for group practices, residents, hospitalists and other medical providers for call, clinic, rotation and shift schedules. On[employer] is a hospital-wide system for scheduling doctors and paging doctors on call. EasyPlot is for scientific plotting and data analysis.  [email or web address]  and use [place]'s universal password to access. If you do not have the password, please contact the hospital operator.  3. Locate the [facility] provider you are looking for under [facility] and page to a number that you can be directly reached. 4. If you still have difficulty reaching the provider, please page the [facility] (Director on Call) for the Hospitalists listed on amion for assistance.  03/14/2019, 6:36 AM

## 2019-03-14 NOTE — ED Notes (Signed)
Pt provided with dinner tray, helped to set it up.

## 2019-03-14 NOTE — Consult Note (Signed)
Renal Service Consult Note [facility]  Unknown Schleyer [facility] 03/14/2019 [name] Requesting Physician: Dr Horris Latino  Reason for Consult:  ESRD pt w/ COVID, hypotension HPI: The patient is a 68 y.o. year-old with hx of prostate Ca, HTN now low BP's, DM2, CAD, blindnes, RCC and ESRD on HD since 2012 sent to ED last night from the SNF for a fall and for newly +COVID test and need of dialysis.  Asked to see for the same.   Pt seen briefly in ED.  No CP or SOB, no sig cough.   ROS  denies CP  no joint pain   no HA  no blurry vision  no rash  no diarrhea  no nausea/ vomiting    Past Medical History  Past Medical History:  Diagnosis Date  . Anemia   . Arthritis    "back, neck" (07/28/2014)  . Blind    both eyes removed   . Bruises easily   . CAD, NATIVE VESSEL 01/08/2008      . Cellulitis late 1980's   "hospitalized; wrapped both legs; several times; no OR for this"  . Colon polyps   . Diabetic neuropathy ([place])   . Dialysis patient ([place])   . DM type 2 (diabetes mellitus, type 2) ([place])    no medications (07/28/2014) diet and excersie controlled not onmeds for 14-15 years   . Dysrhythmia    afib   . ESRD (end stage renal disease) on dialysis [facility])    [name]; Mon, Wed, Fri (07/28/2014)  . Fall    hx of fall and required left hip surgery   . Family history of breast cancer    mother  . Heart murmur    hx of  . History of blood product transfusion   . HYPERLIPIDEMIA-MIXED 01/08/2008  . HYPERTENSION 01/27/2009   BP low , hx of HTN, Currently on meds to raise BP  . Hypotension   . Kidney stones   . Low blood pressure   . OVERWEIGHT/OBESITY 01/08/2008   Lost 205 lbs through diet and exercise.    . Peripheral vascular disease ([place])    vein stripping  . Pneumonia 2000;s X 1  . Prostate cancer ([place])   . Prosthetic eye globe    both eyes  . Renal cell carcinoma 2001 and 2003   "both kidneys"  . Renal insufficiency    Past Surgical History  Past  Surgical History:  Procedure Laterality Date  . AV FISTULA PLACEMENT Left 02/2010   LFA  . AV FISTULA PLACEMENT Left 01/12/2016   Procedure: LEFT BRACHIOCEPHALIC ARTERIOVENOUS (AV) FISTULA CREATION;  Surgeon: Angelia Mould, MD;  Location: [place];  Service: Vascular;  Laterality: Left;  . CHOLECYSTECTOMY OPEN  1992  . COLONOSCOPY  06/14/2011   Procedure: COLONOSCOPY;  Surgeon: [name], MD;  [facility];  Service: Endoscopy;  Laterality: N/A;  . COLONOSCOPY N/A 07/24/2012   Procedure: COLONOSCOPY;  Surgeon: [name], MD;  [facility];  Service: Endoscopy;  Laterality: N/A;  . COLONOSCOPY    . COLONOSCOPY N/A 11/04/2015   Procedure: COLONOSCOPY;  Surgeon: Manus Gunning, MD;  Location: [facility] ENDOSCOPY;  Service: Gastroenterology;  Laterality: N/A;  . COLONOSCOPY WITH PROPOFOL N/A 05/13/2014   Procedure: COLONOSCOPY WITH PROPOFOL;  Surgeon: [name], MD;  [facility];  Service: Endoscopy;  Laterality: N/A;  . CORONARY ARTERY BYPASS GRAFT  09/29/2011   Procedure: CORONARY ARTERY BYPASS GRAFTING (CABG);  Surgeon: Fernande Boyden  Cyndia Bent, MD;  Location: [place] [facility];  Service: Open Heart Surgery;  Laterality: N/A;  Coronary Artery Bypass Graft times two utilizing the left internal mammary artery and the right greater saphenous vein harvested endoscopically.  . CYSTOSCOPY WITH RETROGRADE PYELOGRAM, URETEROSCOPY AND STENT PLACEMENT Left 07/28/2014   Procedure: CYSTOSCOPY WITH RETROGRADE PYELOGRAM, URETERAL BALLOON DILITATION, URETEROSCOPY AND LEFT STENT PLACEMENT;  Surgeon: [name], MD;  [facility];  Service: Urology;  Laterality: Left;  . ENUCLEATION  2003; 2006   bilateral; "diabetes; pain"  . FOOT AMPUTATION Right ~ 2002   right;  partial; "infection"  . FRACTURE SURGERY     hip fx  . HIP PINNING,CANNULATED Left 12/31/2013   Procedure: CANNULATED HIP PINNING;  Surgeon: Renette Butters, MD;  Location: [place];  Service: Orthopedics;  Laterality:  Left;  Carm, FX Table, [employer]  . HOT HEMOSTASIS  06/14/2011   Procedure: HOT HEMOSTASIS (ARGON PLASMA COAGULATION/BICAP);  Surgeon: [name], MD;  Location[facility];  Service: Endoscopy;  Laterality: N/A;  . INSERTION OF DIALYSIS CATHETER Right 01/12/2016   Procedure: INSERTION OF DIALYSIS CATHETER;  Surgeon: Angelia Mould, MD;  Location: [place];  Service: Vascular;  Laterality: Right;  . INSERTION PROSTATE RADIATION SEED  03/2012  . IR GENERIC HISTORICAL  01/13/2016   IR RADIOLOGIST EVAL & MGMT 01/13/2016 Aletta Edouard, MD [facility] INTERV RAD  . IR RADIOLOGIST EVAL & MGMT  03/02/2017  . IR RADIOLOGIST EVAL & MGMT  05/18/2017  . IR RADIOLOGIST EVAL & MGMT  07/27/2017  . LAPAROTOMY N/A 04/07/2016   Procedure: EXPLORATORY LAPAROTOMY AND RESECTION OF RETROPERITONEAL MASS;  Surgeon: Raynelle Bring, MD;  [facility];  Service: Urology;  Laterality: N/A;  . LEFT HEART CATHETERIZATION WITH CORONARY ANGIOGRAM N/A 09/27/2011   Procedure: LEFT HEART CATHETERIZATION WITH CORONARY ANGIOGRAM;  Surgeon: [name], MD;  Location: [facility] CATH LAB;  Service: Cardiovascular;  Laterality: N/A;  . LIGATION OF ARTERIOVENOUS  FISTULA Left 01/12/2016   Procedure: LIGATION OF LEFT RADIOCEPHALIC ARTERIOVENOUS  FISTULA;  Surgeon: Angelia Mould, MD;  Location: [place];  Service: Vascular;  Laterality: Left;  . LIGATION OF COMPETING BRANCHES OF ARTERIOVENOUS FISTULA Left 07/29/2014   Procedure: LIGATION OF COMPETING BRANCHES OF LEFT ARM ARTERIOVENOUS FISTULA;  Surgeon: [name], MD;  Location: [place];  Service: Vascular;  Laterality: Left;  . NEPHRECTOMY Right 01/30/2015   done at [place]  . PARTIAL COLECTOMY Right 04/07/2016   Procedure: RIGHT COLECTOMY AND PARTIAL LIVER RESECTION;  Surgeon: Raynelle Bring, MD;  [facility];  Service: Urology;  Laterality: Right;  . RADIOFREQUENCY ABLATION N/A 04/19/2017   Procedure: CT MICROWAVE THERMAL ABLATION;  Surgeon: Aletta Edouard, MD;   [facility];  Service: Anesthesiology;  Laterality: N/A;  . RADIOFREQUENCY ABLATION KIDNEY  2010-2012   "twice; one on each side; for cancer"  . REVISON OF ARTERIOVENOUS FISTULA Left 09/01/2015   Procedure: EXPLORATION LEFT LOWER ARM  RADIOCEPHALIC ARTERIOVENOUS FISTULA;  Surgeon: Conrad [place], MD;  Location: [place];  Service: Vascular;  Laterality: Left;  [name] VARICOSE VEIN SURGERY  mid 1980's    BLE; "knees down; both legs; 2 separate times"   Family History  Family History  Problem Relation Age of Onset  . Cancer Mother 80       breast, spine and ovarian  . Heart disease Mother   . Hyperlipidemia Mother   . Hypertension Mother   . Varicose Veins Mother   . Emphysema Father   . Cancer  Father 6       kidney, prostate  . Heart disease Father   . Hyperlipidemia Father   . Hypertension Father   . Cancer Sister        ovarian  . Cancer Brother        lung  . Heart disease Brother   . Hyperlipidemia Brother   . Hypertension Brother   . Heart attack Brother   . Peripheral vascular disease Brother   . Cancer Other   . Coronary artery disease Other   . Kidney disease Other   . Breast cancer Other   . Ovarian cancer Other   . Lung cancer Other   . Diabetes Other   . Malignant hyperthermia Neg Hx    Social History  reports that he has never smoked. He has never used smokeless tobacco. He reports that he does not drink alcohol or use drugs. Allergies  Allergies  Allergen Reactions  . Codeine Other (See Comments)     [place]  . Tape Other (See Comments)    Plastic tape tears skin off, please use paper tape instead.   Home medications Prior to Admission medications   Medication Sig Start Date End Date Taking? Authorizing Provider  aspirin EC 81 MG tablet Take 81 mg by mouth at bedtime.   Yes [provider]  atorvastatin (LIPITOR) 10 MG tablet Take 10 mg by mouth daily.   Yes [provider]  benzonatate (TESSALON) 100 MG capsule Take  100 mg by mouth every 8 (eight) hours as needed for cough.   Yes [provider]  cyclobenzaprine (FLEXERIL) 10 MG tablet Take 10 mg by mouth every 12 (twelve) hours as needed. Neck pain 09/26/18  Yes [provider]  ferrous sulfate 325 (65 FE) MG tablet Take 325 mg by mouth See admin instructions. 5am on Monday, Wednesday, Friday 8am on Sunday, Tuesday, Thursday, Saturday   Yes [provider]  JANUVIA 25 MG tablet Take 25 mg by mouth See admin instructions. 8am on Sunday, Tuesday, Thursday, Saturday 5am on [place], Wednesday, Friday 03/04/19  Yes [provider]  midodrine (PROAMATINE) 10 MG tablet Take 1 tablet (10 mg total) by mouth 3 (three) times daily with meals. Patient taking differently: Take 10 mg by mouth See admin instructions. 10mg  Three times day, Three times a week. Tuesday, Thursday, Saturday 11/05/15  Yes Reyne Dumas, MD  multivitamin (RENA-VIT) TABS tablet Take 1 tablet by mouth daily.   Yes [provider]  naproxen sodium (ALEVE) 220 MG tablet Take 220 mg by mouth every 12 (twelve) hours as needed for pain. 01/11/19  Yes [provider]  Probiotic Product (ALIGN) 4 MG CAPS Take 1 capsule by mouth See admin instructions. Take at 8am on Sunday, Tuesday, Thursday, Saturday Take at 5am on Monday, Wednesday, Friday   Yes [provider]  sevelamer carbonate (RENVELA) 800 MG tablet Take 2,400-4,800 mg by mouth See admin instructions. Take 6 tablets at 8am (4800 mg) by mouth 3 times daily with meals on Tuesday, Thursday, Saturday, Sunday Take 6 tablets at 5am (4800 mg) by mouth 3 times daily with meals on Monday, Wednesday, Friday  Take 3 tablets (2400 mg) 2 times daily with snacks 06/26/14  Yes [provider]  pantoprazole (PROTONIX) 40 MG tablet Take 1 tablet (40 mg total) by mouth daily before breakfast. Patient not taking: Reported on 05/29/2018 03/23/18   Mendel Corning, MD   Liver Function Tests Recent Labs   Lab  03/14/19 0700 03/14/19 0857  AST  --  23  ALT 15 16  ALKPHOS  --  76  BILITOT  --  0.9  PROT  --  5.5*  ALBUMIN  --  2.8*   No results for input(s): LIPASE, AMYLASE in the last 168 hours. CBC Recent Labs  Lab 03/14/19 0102  WBC 3.2*  NEUTROABS 2.7  HGB 8.9*  HCT 27.6*  MCV 111.7*  PLT 64*   Basic Metabolic Panel Recent Labs  Lab 03/14/19 0102  NA 142  K 2.9*  CL 97*  CO2 27  GLUCOSE 77  BUN 51*  CREATININE 6.74*  CALCIUM 8.0*   Iron/TIBC/Ferritin/ %Sat    Component Value Date/Time   IRON 26 (L) 04/12/2016 0657   TIBC 200 (L) 04/12/2016 0657   FERRITIN 3,917 (H) 03/14/2019 0857   IRONPCTSAT 13 (L) 04/12/2016 0657    Vitals:   03/14/19 1200 03/14/19 1230 03/14/19 1300 03/14/19 1330  BP: (!) 81/49 (!) 64/37 (!) 84/46 (!) 84/49  Pulse: (!) 56 (!) 56 (!) 56 (!) 56  Resp: 19 19 19 20   Temp:      TempSrc:      SpO2: 91% 93% 95% 93%  Weight:      Height:        Exam Gen alert, not in distress No rash, cyanosis or gangrene Sclera anicteric, throat clear  No jvd or bruits Chest clear bilat except occ rhonchi RRR no MRG Abd soft ntnd no mass or ascites +bs GU normal mal MS no joint effusions or deformity Ext trace LE edema Neuro is alert, responsive , pleasant AVF +bruit   Home meds:  - midodrine 10 tid/ sevelamer carb ac tid  - lipitor 10/  aspirin 81  - januvia 25 mg qd  - prn's/ vitamins/ supplements  - pantoprazole 40    Outpt HD: MWF [facility]  4h   450/800  98kg  2/2 bath  AVF  Hep none  - parsabiv 7.5mg  tiw   - low UF 0.2, 0.8 recently    CXR - clear    Assessment/ Plan: 1. COVID + - not hypoxic, CXR infiltrate, low wbc, no fever.  Admitted and started on IV abx , remdesivir and decadron.    2. Chronic hypotension - BP's runs 70's - 80's in HD per patient. Records support 80's - 90's , occ 70's.   3. Volume - no edema on exam, CXR no edema. Small UF here.  4. ESRD - on HD MWF. Plan HD tomorrow. Will need bed at [facility] 5. HL 6. DM2   7. Anemia ckd - Hb 8.9, not on esa at center I believe 8. MBD ckd - cont binders, won't get parsabiv here      Kelly Splinter  MD 03/14/2019, 2:02 PM

## 2019-03-14 NOTE — ED Notes (Signed)
Patient turned and repositioned.

## 2019-03-14 NOTE — ED Notes (Addendum)
Admitting provider Sylvan Cheese, paged regarding pts BP.  Provider [place] paged as well.  Per Sylvan Cheese, pt is under [place] care until pt is transferred to [facility].  Awaiting response from [place].

## 2019-03-14 NOTE — ED Notes (Signed)
Spoke with Ivin Booty, [facility] at [employer], reported pt is d/t have dialysis appt today.  Wanted to make hospital staff aware. Last dialysis was Tuesday.   [phone].

## 2019-03-14 NOTE — ED Notes (Signed)
Message sent to pharmacy regarding pts missing evening dose of midodrine.

## 2019-03-14 NOTE — Progress Notes (Signed)
PROGRESS NOTE  [name] LFY:[id number] DOB: Jun 06, 1950 DOA: 03/13/2019 PCP: [name], MD  HPI/Recap of past 24 hours: HPI from Dr Altamese Cabal is a 67 y.o. male with medical history significant of ESRD on dialysis TTS, B/L eye blindness, chronic hypotension on daily midodrine, was sent to the ED from [facility] due to generalized weakness, denies any URT symptoms,  Denies cough or SOB. Only c/o of chronic neck, back and leg pains.  Patient fell PTA with only complaints of a skin tear to his right forearm, also has sacral decubitus ulcer which has been hurting him. Noted to be hypotensive in ED with SBPs in the 70s and 80s, but patient reports that its his baseline.  Mentation intact, not tachycardic.  Patient admitted for further management, awaiting transfer to [facility] for dialysis.  Nephrology consulted.    Today, patient denies any new complaints, just reports feeling cold.  Denies any worsening shortness of breath, cough, chest pain, abdominal pain, nausea/vomiting/diarrhea, fever/chills.     Assessment/Plan: Principal Problem:   COVID-19 virus infection Active Problems:   ESRD (end stage renal disease) on dialysis (HCC)   Hypotension of hemodialysis   Thrombocytopenia (HCC)   DM type 2 (diabetes mellitus, type 2) (HCC)   Bilateral blindness   Generalized weakness   Pneumonia due to COVID-19 virus Afebrile, with leukopenia Currently on room air BC x2 pending Inflammatory markers elevated, CRP 16.2, ferritin level >3000 Procalcitonin elevated 2.18 Chest x-ray showed hazy airspace opacity at the right lung base, could be early infectious etiology Start remdesivir, Decadron Start Rocephin, azithromycin Supplemental oxygen as needed, cough suppressants, inhalers, vitamins Monitor closely  Chronic hypotension Reports SBP runs between 70s 80s No tachycardia noted, mentation normal, lactic acid 0.9 Status post 500 cc of IV fluids  Continue midodrine  ESRD Nephrology consulted, TTS schedule Plan to transfer to [facility] due to dialysis needs  Hypokalemia Replace as needed       Malnutrition Type:      Malnutrition Characteristics:      Nutrition Interventions:       Estimated body mass index is 26.29 kg/m as calculated from the following:   Height as of this encounter: 6\' 4"  (1.93 m).   Weight as of this encounter: 98 kg.     Code Status: Full  Family Communication: None at bedside  Disposition Plan: To be determined, transferred to [facility] needs   Consultants:  Nephrology  Procedures:  None  Antimicrobials:  Ceftriaxone  Azithromycin  DVT prophylaxis: Heparin   Objective: Vitals:   03/14/19 1100 03/14/19 1130 03/14/19 1200 03/14/19 1230  BP: (!) 73/47 (!) 76/36 (!) 81/49 (!) 64/37  Pulse: (!) 55 (!) 55 (!) 56 (!) 56  Resp: 20 18 19 19   Temp:      TempSrc:      SpO2: 96% 94% 91% 93%  Weight:      Height:        Intake/Output Summary (Last 24 hours) at 03/14/2019 1254 Last data filed at 03/14/2019 0944 Gross per 24 hour  Intake 250 ml  Output -  Net 250 ml   Filed Weights   03/13/19 2344  Weight: 98 kg    Exam:  General: NAD, bilateral prosthetic eye globe  Cardiovascular: S1, S2 present  Respiratory: CTAB  Abdomen: Soft, nontender, nondistended, bowel sounds present  Musculoskeletal: No bilateral pedal edema noted  Skin:  Sacral decubitus, skin tear right forearm  Psychiatry:  Normal mood    Data Reviewed: CBC: Recent Labs  Lab 03/14/19 0102  WBC 3.2*  NEUTROABS 2.7  HGB 8.9*  HCT 27.6*  MCV 111.7*  PLT 64*   Basic Metabolic Panel: Recent Labs  Lab 03/14/19 0102  NA 142  K 2.9*  CL 97*  CO2 27  GLUCOSE 77  BUN 51*  CREATININE 6.74*  CALCIUM 8.0*   GFR: Estimated Creatinine Clearance: 12.7 mL/min (A) (by C-G formula based on SCr of 6.74 mg/dL (H)). Liver Function Tests: Recent Labs  Lab  03/14/19 0700 03/14/19 0857  AST  --  23  ALT 15 16  ALKPHOS  --  76  BILITOT  --  0.9  PROT  --  5.5*  ALBUMIN  --  2.8*   No results for input(s): LIPASE, AMYLASE in the last 168 hours. No results for input(s): AMMONIA in the last 168 hours. Coagulation Profile: No results for input(s): INR, PROTIME in the last 168 hours. Cardiac Enzymes: No results for input(s): CKTOTAL, CKMB, CKMBINDEX, TROPONINI in the last 168 hours. BNP (last 3 results) No results for input(s): PROBNP in the last 8760 hours. HbA1C: No results for input(s): HGBA1C in the last 72 hours. CBG: No results for input(s): GLUCAP in the last 168 hours. Lipid Profile: No results for input(s): CHOL, HDL, LDLCALC, TRIG, CHOLHDL, LDLDIRECT in the last 72 hours. Thyroid Function Tests: No results for input(s): TSH, T4TOTAL, FREET4, T3FREE, THYROIDAB in the last 72 hours. Anemia Panel: Recent Labs    03/14/19 0857  FERRITIN 3,917*   Urine analysis:    Component Value Date/Time   COLORURINE YELLOW 11/01/2015 1917   APPEARANCEUR CLOUDY (A) 11/01/2015 1917   LABSPEC 1.012 11/01/2015 1917   PHURINE 7.0 11/01/2015 1917   GLUCOSEU NEGATIVE 11/01/2015 1917   HGBUR SMALL (A) 11/01/2015 1917   BILIRUBINUR NEGATIVE 11/01/2015 1917   KETONESUR NEGATIVE 11/01/2015 1917   PROTEINUR 100 (A) 11/01/2015 1917   UROBILINOGEN 0.2 07/28/2014 1007   NITRITE NEGATIVE 11/01/2015 1917   LEUKOCYTESUR LARGE (A) 11/01/2015 1917   Sepsis Labs: @LABRCNTIP (procalcitonin:4,lacticidven:4)  )No results found for this or any previous visit (from the past 240 hour(s)).    Studies: DG Chest Port 1 View  Result Date: 03/14/2019 CLINICAL DATA:  Covid positive EXAM: PORTABLE CHEST 1 VIEW COMPARISON:  March 20, 2018 FINDINGS: There is mild cardiomegaly. Overlying median sternotomy wires. Aortic knob calcifications. Mildly increased hazy airspace opacity seen at the right lung base. The left lung is clear. No acute osseous abnormality.  IMPRESSION: Mildly increased hazy airspace opacity at the right lung base which is non-specific could be due to atelectasis and/or early infectious etiology. Electronically Signed   By: Prudencio Pair M.D.   On: 03/14/2019 08:58    Scheduled Meds: . albuterol  2 puff Inhalation Q6H  . atorvastatin  10 mg Oral Daily  . midodrine  10 mg Oral TID WC  . multivitamin  1 tablet Oral Daily  . sevelamer carbonate  2,400-4,800 mg Oral See admin instructions    Continuous Infusions: . sodium chloride 1,000 mL (03/14/19 0851)  . azithromycin    . cefTRIAXone (ROCEPHIN)  IV 1 g (03/14/19 1055)  . [START ON 03/15/2019] remdesivir 100 mg in NS 100 mL       LOS: 0 days     Alma Friendly, MD [facility]  If 7PM-7AM, please contact night-coverage [email or web address] 03/14/2019, 12:54 PM

## 2019-03-14 NOTE — ED Notes (Signed)
[facility] called for transportation. Paperwork at nurses station.

## 2019-03-14 NOTE — ED Notes (Signed)
Pt called out requesting to be moved up and rolled in bed. Pt's brief was check and cleaned due to pt having a bowl movement. Barrier cream was applied to his sacrum and a then was provided a fresh brief. Pt was positioned in bed and rolled onto his left side. Pillows were applied to voids for comfort.

## 2019-03-15 LAB — CBC WITH DIFFERENTIAL/PLATELET
Abs Immature Granulocytes: 0.06 10*3/uL (ref 0.00–0.07)
Basophils Absolute: 0 10*3/uL (ref 0.0–0.1)
Basophils Relative: 0 %
Eosinophils Absolute: 0 10*3/uL (ref 0.0–0.5)
Eosinophils Relative: 2 %
HCT: 25.7 % — ABNORMAL LOW (ref 39.0–52.0)
Hemoglobin: 8.5 g/dL — ABNORMAL LOW (ref 13.0–17.0)
Immature Granulocytes: 3 %
Lymphocytes Relative: 9 %
Lymphs Abs: 0.2 10*3/uL — ABNORMAL LOW (ref 0.7–4.0)
MCH: 36 pg — ABNORMAL HIGH (ref 26.0–34.0)
MCHC: 33.1 g/dL (ref 30.0–36.0)
MCV: 108.9 fL — ABNORMAL HIGH (ref 80.0–100.0)
Monocytes Absolute: 0.3 10*3/uL (ref 0.1–1.0)
Monocytes Relative: 15 %
Neutro Abs: 1.4 10*3/uL — ABNORMAL LOW (ref 1.7–7.7)
Neutrophils Relative %: 71 %
Platelets: 83 10*3/uL — ABNORMAL LOW (ref 150–400)
RBC: 2.36 MIL/uL — ABNORMAL LOW (ref 4.22–5.81)
RDW: 15.2 % (ref 11.5–15.5)
WBC: 2 10*3/uL — ABNORMAL LOW (ref 4.0–10.5)
nRBC: 0 % (ref 0.0–0.2)

## 2019-03-15 LAB — RENAL FUNCTION PANEL
Albumin: 2.2 g/dL — ABNORMAL LOW (ref 3.5–5.0)
Anion gap: 15 (ref 5–15)
BUN: 68 mg/dL — ABNORMAL HIGH (ref 8–23)
CO2: 25 mmol/L (ref 22–32)
Calcium: 7.5 mg/dL — ABNORMAL LOW (ref 8.9–10.3)
Chloride: 103 mmol/L (ref 98–111)
Creatinine, Ser: 9.09 mg/dL — ABNORMAL HIGH (ref 0.61–1.24)
GFR calc Af Amer: 6 mL/min — ABNORMAL LOW (ref >60)
GFR calc non Af Amer: 5 mL/min — ABNORMAL LOW (ref >60)
Glucose, Bld: 86 mg/dL (ref 70–99)
Phosphorus: 7.2 mg/dL — ABNORMAL HIGH (ref 2.5–4.6)
Potassium: 3.4 mmol/L — ABNORMAL LOW (ref 3.5–5.1)
Sodium: 143 mmol/L (ref 135–145)

## 2019-03-15 LAB — GLUCOSE, CAPILLARY: Glucose-Capillary: 66 mg/dL — ABNORMAL LOW (ref 70–99)

## 2019-03-15 LAB — MRSA PCR SCREENING: MRSA by PCR: NEGATIVE

## 2019-03-15 LAB — SARS CORONAVIRUS 2 (TAT 6-24 HRS): SARS Coronavirus 2: POSITIVE — AB

## 2019-03-15 MED ORDER — SEVELAMER CARBONATE 800 MG PO TABS: 2400.0000 mg | ORAL_TABLET | ORAL | Status: DC

## 2019-03-15 MED ORDER — DEXTROSE 50 % IV SOLN
INTRAVENOUS | Status: AC
  Administered 2019-03-16: 50 mL
  Filled 2019-03-15: qty 50

## 2019-03-15 MED ORDER — ORAL CARE MOUTH RINSE
15.0000 mL | Freq: Two times a day (BID) | OROMUCOSAL | Status: DC
  Administered 2019-03-15 – 2019-03-26 (×11): 15 mL via OROMUCOSAL

## 2019-03-15 MED ORDER — CYCLOBENZAPRINE HCL 5 MG PO TABS
5.0000 mg | ORAL_TABLET | Freq: Three times a day (TID) | ORAL | Status: DC | PRN
  Administered 2019-03-15 – 2019-03-19 (×3): 5 mg via ORAL
  Filled 2019-03-15 (×3): qty 1

## 2019-03-15 NOTE — Progress Notes (Signed)
[facility] Progress Note  Subjective:  Patient not examined today directly given COVID-19 + status, utilizing exam of the primary team and observations of RN's.   Date taken from chart.   Vitals:   03/15/19 0130 03/15/19 0138 03/15/19 0310 03/15/19 0346  BP: (!) 70/35 (!) 75/42 (!) 88/55   Pulse: (!) 57 (!) 57 (!) 57   Resp: 20 16 20    Temp:   98.8 F (37.1 C)   TempSrc:   Oral   SpO2: 95% 95% 94%   Weight:    113.4 kg  Height:    6\' 4"  (1.93 m)    Inpatient medications: . albuterol  2 puff Inhalation Q6H  . atorvastatin  10 mg Oral Daily  . Chlorhexidine Gluconate Cloth  6 each Topical Q0600  . heparin injection (subcutaneous)  5,000 Units Subcutaneous Q8H  . mouth rinse  15 mL Mouth Rinse BID  . midodrine  10 mg Oral TID WC  . multivitamin  1 tablet Oral Daily  . sevelamer carbonate  2,400 mg Oral With snacks  . sevelamer carbonate  4,800 mg Oral TID WC   . sodium chloride 1,000 mL (03/14/19 0851)  . azithromycin Stopped (03/14/19 1401)  . cefTRIAXone (ROCEPHIN)  IV 1 g (03/15/19 1054)  . remdesivir 100 mg in NS 100 mL 200 mL/hr at 03/15/19 1011   sodium chloride, acetaminophen, ondansetron **OR** ondansetron (ZOFRAN) IV    Exam:  Patient not examined today directly given COVID-19 + status, utilizing exam of the primary team and observations of RN's.     Home meds:  - midodrine 10 tid/ sevelamer carb ac tid  - lipitor 10/  aspirin 81  - januvia 25 mg qd  - prn's/ vitamins/ supplements  - pantoprazole 40    Outpt HD: MWF [facility]  4h   450/800  98kg  2/2 bath  AVF  Hep none  - parsabiv 7.5mg  tiw   - low UF 0.2, 0.8 recently    CXR - clear    Assessment/ Plan: 1. COVID +PNA: not hypoxic, CXR +infiltrate, low wbc, no fever.  Admitted and started on IV abx , remdesivir and decadron.    2. Chronic hypotension - BP's runs 70's - 80's in HD per patient. OP records show also 80's - 90's , occ 70's.   3. Volume - no edema on exam, CXR no edema.  UF as tolerated.   4. ESRD - on HD MWF. Plan HD today.  5. HL 6. DM2  7. Anemia ckd - Hb 8.9, not on esa at center  8. MBD ckd - cont binders     [name] 03/15/2019, 11:56 AM

## 2019-03-15 NOTE — Progress Notes (Addendum)
[name]  PROGRESS NOTE    [name]  GOT:[id number] DOB: Aug 03, 1950 DOA: 03/13/2019 PCP: [name], MD   Brief Narrative:   [name] [name] a 69 y.o.malewith medical history significant ofESRD on dialysis TTS, B/L eye blindness, chronic hypotension on daily midodrine, was sent to the ED from [facility] due to generalized weakness, denies any URT symptoms,  Denies cough or SOB. Only c/o of chronic neck, back and leg pains.  Patient fell PTA with only complaints of a skin tear to his right forearm, also has sacral decubitus ulcer which has been hurting him. Noted to be hypotensive in ED with SBPs in the 70s and 80s, but patient reports that its his baseline.  Mentation intact, not tachycardic.  Patient admitted for further management, awaiting transfer to [facility] for dialysis.  Nephrology consulted.  03/15/19: Transferred from [facility] w/ Nicholas back pain. Chronic hypotension. Titrating O2 support to need.  Notified of bradycardia. Ordered EKG. Results yet to be uploaded to [place], but notified it was slow a fib. He is asymptomatic. For now will monitor. He has been previously noted to have slow afib during cardiology evaluation in 02/15/2018 w/ Dr. Percival Spanish. Anticoagulation was discussed during that visit, but nothing was decided. The patient did not follow up on this subject. ChadVasc is atleast 3. Consider initiation of AC this visit. Place on tele.  Assessment & Plan:   Principal Problem:   COVID-19 virus infection Active Problems:   ESRD (end stage renal disease) on dialysis (HCC)   Hypotension of hemodialysis   Thrombocytopenia (HCC)   DM type 2 (diabetes mellitus, type 2) (HCC)   Bilateral blindness   Generalized weakness  Pneumonia due to COVID-19 virus     - CXR: hazy airspace opacity at the right lung base     - titrate O2 support to SpO2 92%     - remdesivir, decadron, rocephin, azithromycin     - PRN ventolin inhalor     - COVID 19+     - Bld Cx  NTD     - trend inflammatory markers  Chronic hypotension     - reports SBP runs between 70s 80s     - No tachycardia noted, mentation normal, lactic acid 0.9     - continue home midodrine  ESRD     - per nephrology, appreciate assistance  Hypokalemia     - replete, monitor  Anemia of chronic disease     - no evidence of frank bleed, monitor  HLD     - lipitor   DVT prophylaxis: heparin Code Status: FULL   Disposition Plan: TBD  Consultants:   Nephrology  Antimicrobials:  . Rocephin, azithromycin, remdesivir   Subjective: C/o back pain per nursing  Objective: Vitals:   03/15/19 0130 03/15/19 0138 03/15/19 0310 03/15/19 0346  BP: (!) 70/35 (!) 75/42 (!) 88/55   Pulse: (!) 57 (!) 57 (!) 57   Resp: 20 16 20    Temp:   98.8 F (37.1 C)   TempSrc:   Oral   SpO2: 95% 95% 94%   Weight:    113.4 kg  Height:    6\' 4"  (1.93 m)    Intake/Output Summary (Last 24 hours) at 03/15/2019 1354 Last data filed at 03/15/2019 0900 Gross per 24 hour  Intake 389.81 ml  Output -  Net 389.81 ml   Filed Weights   03/13/19 2344 03/15/19 0346  Weight: 98 kg 113.4 kg  Examination:  General: 68 y.o. male resting in bed in NAD Cardiovascular: RRR, +S1, S2, no m/g/r Respiratory: CTABL, no w/r/r, normal WOB GI: BS+, NDNT, no masses noted, no organomegaly noted MSK: No e/c/c Neuro: somnolent   Data Reviewed: I have personally reviewed following labs and imaging studies.  CBC: Recent Labs  Lab 03/14/19 0102  WBC 3.2*  NEUTROABS 2.7  HGB 8.9*  HCT 27.6*  MCV 111.7*  PLT 64*   Basic Metabolic Panel: Recent Labs  Lab 03/14/19 0102  NA 142  K 2.9*  CL 97*  CO2 27  GLUCOSE 77  BUN 51*  CREATININE 6.74*  CALCIUM 8.0*   GFR: Estimated Creatinine Clearance: 14.3 mL/min (A) (by C-G formula based on SCr of 6.74 mg/dL (H)). Liver Function Tests: Recent Labs  Lab 03/14/19 0700 03/14/19 0857  AST  --  23  ALT 15 16  ALKPHOS  --  76  BILITOT  --  0.9   PROT  --  5.5*  ALBUMIN  --  2.8*   No results for input(s): LIPASE, AMYLASE in the last 168 hours. No results for input(s): AMMONIA in the last 168 hours. Coagulation Profile: No results for input(s): INR, PROTIME in the last 168 hours. Cardiac Enzymes: No results for input(s): CKTOTAL, CKMB, CKMBINDEX, TROPONINI in the last 168 hours. BNP (last 3 results) No results for input(s): PROBNP in the last 8760 hours. HbA1C: No results for input(s): HGBA1C in the last 72 hours. CBG: No results for input(s): GLUCAP in the last 168 hours. Lipid Profile: No results for input(s): CHOL, HDL, LDLCALC, TRIG, CHOLHDL, LDLDIRECT in the last 72 hours. Thyroid Function Tests: No results for input(s): TSH, T4TOTAL, FREET4, T3FREE, THYROIDAB in the last 72 hours. Anemia Panel: Recent Labs    03/14/19 0857  FERRITIN 3,917*   Sepsis Labs: Recent Labs  Lab 03/14/19 0621 03/14/19 0648  PROCALCITON 2.18  --   LATICACIDVEN  --  0.9    Recent Results (from the past 240 hour(s))  Culture, blood (routine x 2)     Status: None (Preliminary result)   Collection Time: 03/14/19  8:57 AM   Specimen: BLOOD  Result Value Ref Range Status   Specimen Description   Final    BLOOD RIGHT ANTECUBITAL Performed at [facility], [place] [address], [place], [place] [address]    Special Requests   Final    BOTTLES DRAWN AEROBIC AND ANAEROBIC Blood Culture adequate volume Performed at [place] [address], [place], [place] [address]    Culture   Final    NO GROWTH 1 DAY Performed at [facility], [place] [address] [place], [place] [address]    Report Status PENDING  Incomplete  Culture, blood (routine x 2)     Status: None (Preliminary result)   Collection Time: 03/14/19  8:58 AM   Specimen: BLOOD  Result Value Ref Range Status   Specimen Description   Final    BLOOD BLOOD RIGHT FOREARM Performed at [facility] [address]  [place], [place] [address]    Special Requests   Final    BOTTLES DRAWN AEROBIC AND ANAEROBIC Blood Culture adequate volume Performed at [place] [address], [place], [place] [address]    Culture   Final    NO GROWTH 1 DAY Performed at [facility], [place] [address], [place], [place] [address]    Report Status PENDING  Incomplete  MRSA PCR  Screening     Status: None   Collection Time: 03/15/19  3:08 AM   Specimen: Nasopharyngeal  Result Value Ref Range Status   MRSA by PCR NEGATIVE NEGATIVE Final    Comment:        The GeneXpert MRSA Assay (FDA approved for NASAL specimens only), is one component of a comprehensive MRSA colonization surveillance program. It is not intended to diagnose MRSA infection nor to guide or monitor treatment for MRSA infections. Performed at [facility], [place] [address] [place], [place] [address]       Radiology Studies: DG Chest Port 1 View  Result Date: 03/14/2019 CLINICAL DATA:  Covid positive EXAM: PORTABLE CHEST 1 VIEW COMPARISON:  March 20, 2018 FINDINGS: There is mild cardiomegaly. Overlying median sternotomy wires. Aortic knob calcifications. Mildly increased hazy airspace opacity seen at the right lung base. The left lung is clear. No acute osseous abnormality. IMPRESSION: Mildly increased hazy airspace opacity at the right lung base which is non-specific could be due to atelectasis and/or early infectious etiology. Electronically Signed   By: Prudencio Pair M.D.   On: 03/14/2019 08:58     Scheduled Meds: . albuterol  2 puff Inhalation Q6H  . atorvastatin  10 mg Oral Daily  . Chlorhexidine Gluconate Cloth  6 each Topical Q0600  . heparin injection (subcutaneous)  5,000 Units Subcutaneous Q8H  . mouth rinse  15 mL Mouth Rinse BID  . midodrine  10 mg Oral TID WC  . multivitamin  1 tablet Oral Daily  . sevelamer carbonate  2,400 mg Oral With snacks  . sevelamer carbonate  4,800 mg Oral TID WC    Continuous Infusions: . sodium chloride 1,000 mL (03/14/19 0851)  . azithromycin 500 mg (03/15/19 1236)  . cefTRIAXone (ROCEPHIN)  IV 1 g (03/15/19 1054)  . remdesivir 100 mg in NS 100 mL 200 mL/hr at 03/15/19 1011     LOS: 1 day    Time spent: 25 minutes spent in the coordination of care today.    Jonnie Finner, DO [facility] Pager [phone]  If 7PM-7AM, please contact night-coverage [email or web address] Password [email or web address] 03/15/2019, 1:54 PM

## 2019-03-15 NOTE — Progress Notes (Signed)
[name] [id number] Admission Data: 03/15/2019 3:52 AM Attending Provider: Alma Friendly, MD  XNT:ZGYF, Gwyndolyn Saxon, MD Consults/ Treatment Team: Treatment Team:  Roney Jaffe, MD  [name] is a 68 y.o. male patient admitted from [facility] awake, alert  & orientated  X 3,  Full Code, VSS - Blood pressure (!) 88/55, pulse (!) 57, temperature 98.8 F (37.1 C), temperature source Oral, resp. rate 20, height 6\' 4"  (1.93 m), weight 113.4 kg, SpO2 94 %., room air, no c/o shortness of breath, no c/o chest pain, no distress noted.   IV site WDL:  forearm right, condition patent and no redness and left, condition patent and no redness with a transparent dsg that's clean dry and intact.  Allergies:   Allergies  Allergen Reactions  . Codeine Other (See Comments)     [place]  . Tape Other (See Comments)    Plastic tape tears skin off, please use paper tape instead.     Past Medical History:  Diagnosis Date  . Anemia   . Arthritis    "back, neck" (07/28/2014)  . Blind    both eyes removed   . Bruises easily   . CAD, NATIVE VESSEL 01/08/2008      . Cellulitis late 1980's   "hospitalized; wrapped both legs; several times; no OR for this"  . Colon polyps   . Diabetic neuropathy ([place])   . Dialysis patient ([place])   . DM type 2 (diabetes mellitus, type 2) ([place])    no medications (07/28/2014) diet and excersie controlled not onmeds for 14-15 years   . Dysrhythmia    afib   . ESRD (end stage renal disease) on dialysis [facility])    [name]; Mon, Wed, Fri (07/28/2014)  . Fall    hx of fall and required left hip surgery   . Family history of breast cancer    mother  . Heart murmur    hx of  . History of blood product transfusion   . HYPERLIPIDEMIA-MIXED 01/08/2008  . HYPERTENSION 01/27/2009   BP low , hx of HTN, Currently on meds to raise BP  . Hypotension   . Kidney stones   . Low blood pressure   . OVERWEIGHT/OBESITY 01/08/2008   Lost 205 lbs through  diet and exercise.    . Peripheral vascular disease ([place])    vein stripping  . Pneumonia 2000;s X 1  . Prostate cancer ([place])   . Prosthetic eye globe    both eyes  . Renal cell carcinoma 2001 and 2003   "both kidneys"  . Renal insufficiency    Pt orientation to unit, room and routine. Information packet given to patient/family and safety video watched.  Admission INP armband ID verified with patient/family, and in place. SR up x 2, fall risk assessment complete with Patient and family verbalizing understanding of risks associated with falls. Pt verbalizes an understanding of how to use the call bell and to call for help before getting out of bed.  Skin tear on right forearm and stage II pressure injury on left buttocks. Skin tear assessed and gauze wrapping reapplied. Foam dressing applied to stage II pressure injury.   Will cont to monitor and assist as needed.  Walker Shadow, RN 03/15/2019 3:52 AM

## 2019-03-15 NOTE — Progress Notes (Signed)
Renal Navigator notes that patient tested positive for COVID at place of residence prior to coming to the hospital. Navigator spoke with Quarry manager at [employer] who states she is aware of patient. At discharge from hospital, patient will receive OP HD treatment on a TTS schedule with a seat time of 12:00pm at [facility], located at [address] [place], [place].  Alphonzo Cruise, [place] Renal Navigator [phone]

## 2019-03-16 LAB — RENAL FUNCTION PANEL
Albumin: 2.3 g/dL — ABNORMAL LOW (ref 3.5–5.0)
Anion gap: 18 — ABNORMAL HIGH (ref 5–15)
BUN: 72 mg/dL — ABNORMAL HIGH (ref 8–23)
CO2: 25 mmol/L (ref 22–32)
Calcium: 8.3 mg/dL — ABNORMAL LOW (ref 8.9–10.3)
Chloride: 101 mmol/L (ref 98–111)
Creatinine, Ser: 9.09 mg/dL — ABNORMAL HIGH (ref 0.61–1.24)
GFR calc Af Amer: 6 mL/min — ABNORMAL LOW (ref >60)
GFR calc non Af Amer: 5 mL/min — ABNORMAL LOW (ref >60)
Glucose, Bld: 105 mg/dL — ABNORMAL HIGH (ref 70–99)
Phosphorus: 6.9 mg/dL — ABNORMAL HIGH (ref 2.5–4.6)
Potassium: 3.8 mmol/L (ref 3.5–5.1)
Sodium: 144 mmol/L (ref 135–145)

## 2019-03-16 LAB — CBC WITH DIFFERENTIAL/PLATELET
Abs Immature Granulocytes: 0.05 10*3/uL (ref 0.00–0.07)
Basophils Absolute: 0 10*3/uL (ref 0.0–0.1)
Basophils Relative: 0 %
Eosinophils Absolute: 0.1 10*3/uL (ref 0.0–0.5)
Eosinophils Relative: 4 %
HCT: 26.9 % — ABNORMAL LOW (ref 39.0–52.0)
Hemoglobin: 8.9 g/dL — ABNORMAL LOW (ref 13.0–17.0)
Immature Granulocytes: 3 %
Lymphocytes Relative: 11 %
Lymphs Abs: 0.2 10*3/uL — ABNORMAL LOW (ref 0.7–4.0)
MCH: 36 pg — ABNORMAL HIGH (ref 26.0–34.0)
MCHC: 33.1 g/dL (ref 30.0–36.0)
MCV: 108.9 fL — ABNORMAL HIGH (ref 80.0–100.0)
Monocytes Absolute: 0.3 10*3/uL (ref 0.1–1.0)
Monocytes Relative: 15 %
Neutro Abs: 1.4 10*3/uL — ABNORMAL LOW (ref 1.7–7.7)
Neutrophils Relative %: 67 %
Platelets: 89 10*3/uL — ABNORMAL LOW (ref 150–400)
RBC: 2.47 MIL/uL — ABNORMAL LOW (ref 4.22–5.81)
RDW: 15.1 % (ref 11.5–15.5)
WBC: 2 10*3/uL — ABNORMAL LOW (ref 4.0–10.5)
nRBC: 0 % (ref 0.0–0.2)

## 2019-03-16 LAB — COMPREHENSIVE METABOLIC PANEL
ALT: 18 U/L (ref 0–44)
AST: 21 U/L (ref 15–41)
Albumin: 2.3 g/dL — ABNORMAL LOW (ref 3.5–5.0)
Alkaline Phosphatase: 80 U/L (ref 38–126)
Anion gap: 16 — ABNORMAL HIGH (ref 5–15)
BUN: 76 mg/dL — ABNORMAL HIGH (ref 8–23)
CO2: 24 mmol/L (ref 22–32)
Calcium: 8 mg/dL — ABNORMAL LOW (ref 8.9–10.3)
Chloride: 103 mmol/L (ref 98–111)
Creatinine, Ser: 9.52 mg/dL — ABNORMAL HIGH (ref 0.61–1.24)
GFR calc Af Amer: 6 mL/min — ABNORMAL LOW (ref >60)
GFR calc non Af Amer: 5 mL/min — ABNORMAL LOW (ref >60)
Glucose, Bld: 152 mg/dL — ABNORMAL HIGH (ref 70–99)
Potassium: 3.6 mmol/L (ref 3.5–5.1)
Sodium: 143 mmol/L (ref 135–145)
Total Bilirubin: 1.2 mg/dL (ref 0.3–1.2)
Total Protein: 5 g/dL — ABNORMAL LOW (ref 6.5–8.1)

## 2019-03-16 LAB — D-DIMER, QUANTITATIVE: D-Dimer, Quant: 4.82 ug/mL-FEU — ABNORMAL HIGH (ref 0.00–0.50)

## 2019-03-16 LAB — FERRITIN: Ferritin: 4450 ng/mL — ABNORMAL HIGH (ref 24–336)

## 2019-03-16 LAB — MAGNESIUM: Magnesium: 2.2 mg/dL (ref 1.7–2.4)

## 2019-03-16 LAB — GLUCOSE, CAPILLARY: Glucose-Capillary: 165 mg/dL — ABNORMAL HIGH (ref 70–99)

## 2019-03-16 LAB — HEPATITIS B SURFACE ANTIGEN: Hepatitis B Surface Ag: NONREACTIVE

## 2019-03-16 LAB — C-REACTIVE PROTEIN: CRP: 21.9 mg/dL — ABNORMAL HIGH (ref <1.0)

## 2019-03-16 MED ORDER — DEXAMETHASONE SODIUM PHOSPHATE 10 MG/ML IJ SOLN
6.0000 mg | INTRAMUSCULAR | Status: DC
  Administered 2019-03-16 – 2019-03-22 (×7): 6 mg via INTRAVENOUS
  Filled 2019-03-16 (×7): qty 1

## 2019-03-16 NOTE — Progress Notes (Signed)
Patient's son - [name] updated via phone. All questions answered. [name] to call back later to speak with patient.

## 2019-03-16 NOTE — Progress Notes (Addendum)
   03/16/19 1604  Vitals  Temp 98.6 F (37 C)  Temp Source Oral  BP (!) 85/43  MAP (mmHg) (!) 57  Pulse Rate (!) 54  ECG Heart Rate (!) 56  Resp 13  Oxygen Therapy  SpO2 91 %  O2 Device Nasal Cannula  O2 Flow Rate (L/min) 3 L/min  MEWS Score  MEWS RR 1  MEWS Pulse 0  MEWS Systolic 1  MEWS LOC 0  MEWS Temp 0  MEWS Score 2  MEWS Score Color Yellow  MEWS Assessment  Is this an acute change? No  Provider Notification  Provider Name/Title Cherylann Ratel, DO  Date Provider Notified 03/16/19  Time Provider Notified 1610  Notification Type Page  Notification Reason Other (Comment) (to make aware )  Response No new orders

## 2019-03-16 NOTE — Progress Notes (Signed)
Patient refused to eat dinner and super.  CBG is 66.D50% given and a recheck was 165.  Patient is now in dialysis.  Will continue to monitor.

## 2019-03-16 NOTE — Progress Notes (Signed)
[name]  PROGRESS NOTE    [name]  ZHY:[id number] DOB: 07/02/1950 DOA: 03/13/2019 PCP: [name], MD   Brief Narrative:   [name] a 68 y.o.malewith medical history significant ofESRD on dialysis TTS,B/L eyeblindness, chronic hypotension on daily midodrine,was sent to the EDfrom [facility] due to generalized weakness, denies any URT symptoms, Denies cough or SOB.Only c/o ofchronic neck, back and leg pains. Patient fell PTA with only complaints of a skin tear to his right forearm, also has sacral decubitus ulcer which has been hurting him. Noted to be hypotensive in ED with SBPs in the 70s and 80s, but patient reports thatits hisbaseline.Mentation intact,not tachycardic. Patient admitted for further management, awaiting transfer to [facility] for dialysis. Nephrology consulted.  03/15/19: Transferred from [facility] w/ [place] back pain. Chronic hypotension. Titrating O2 support to need. 03/16/19: Spoke with patient about long term AC. He has declined a decision at this time. Resume steroids.    Assessment & Plan:   Principal Problem:   COVID-19 virus infection Active Problems:   ESRD (end stage renal disease) on dialysis (HCC)   Hypotension of hemodialysis   Thrombocytopenia (HCC)   DM type 2 (diabetes mellitus, type 2) (HCC)   Bilateral blindness   Generalized weakness  Pneumonia due to COVID-19 virus     - CXR: hazy airspace opacity at the right lung base     - titrate O2 support to SpO2 92%     - remdesivir, resume decadron, rocephin (procal elevated on 12/10 - continue), d/c azithromycin     - PRN ventolin inhalor     - COVID 19+     - Bld Cx NTD     - trend inflammatory markers  Chronic hypotension     - reports SBP runs between 70s 80s     - No tachycardia noted, mentation normal, lactic acid 0.9     - continue home midodrine  ESRD     - per nephrology, appreciate assistance  Hypokalemia     - resolved,  monitor  Anemia of chronic disease Macrocytosis Thrombocytopenia     - no evidence of frank bleed, monitor     - check B12, THF  HLD     - lipitor  Hx of afib (slow)     - last eval'd by cards 02/15/18: rec'd AC at the time, but pt declined answer and did not follow up     - spoke to him about [facility] today; he says he will think about it.   DVT prophylaxis: heparin Code Status: FULL   Disposition Plan: TBD  Consultants:   Nephrology  Antimicrobials:  . Rocephin, remdesivir   Subjective: "I'll think about it."  Objective: Vitals:   03/16/19 0513 03/16/19 0826 03/16/19 1150 03/16/19 1604  BP: (!) 98/59 (!) 94/52 (!) 93/59 (!) 85/43  Pulse: (!) 54 (!) 57 (!) 57 (!) 54  Resp: 17 13 14 13   Temp: 98.3 F (36.8 C) 98.1 F (36.7 C) 98.3 F (36.8 C) 98.6 F (37 C)  TempSrc: Oral Oral Oral Oral  SpO2: 95% 95% 93% 91%  Weight:      Height:        Intake/Output Summary (Last 24 hours) at 03/16/2019 1626 Last data filed at 03/16/2019 1033 Gross per 24 hour  Intake 320 ml  Output 1200 ml  Net -880 ml   Filed Weights   03/15/19 0346 03/16/19 0025 03/16/19 0330  Weight: 113.4 kg 111 kg 109.8  kg    Examination:  General: 68 y.o. male resting in bed in NAD Cardiovascular: brady, +S1, S2, no m/g/r, equal pulses throughout Respiratory: CTABL, no w/r/r GI: BS+, NDNT, soft MSK: No e/c/c Neuro: alert to name, follows commands Psyc: Appropriate interaction and affect, calm/cooperative   Data Reviewed: I have personally reviewed following labs and imaging studies.  CBC: Recent Labs  Lab 03/14/19 0102 03/15/19 1443 03/16/19 0500  WBC 3.2* 2.0* 2.0*  NEUTROABS 2.7 1.4* 1.4*  HGB 8.9* 8.5* 8.9*  HCT 27.6* 25.7* 26.9*  MCV 111.7* 108.9* 108.9*  PLT 64* 83* 89*   Basic Metabolic Panel: Recent Labs  Lab 03/14/19 0102 03/15/19 1443 03/16/19 0500 03/16/19 1235  NA 142 143 144 143  K 2.9* 3.4* 3.8 3.6  CL 97* 103 101 103  CO2 27 25 25 24   GLUCOSE 77 86 105*  152*  BUN 51* 68* 72* 76*  CREATININE 6.74* 9.09* 9.09* 9.52*  CALCIUM 8.0* 7.5* 8.3* 8.0*  MG  --   --  2.2  --   PHOS  --  7.2* 6.9*  --    GFR: Estimated Creatinine Clearance: 9.9 mL/min (A) (by C-G formula based on SCr of 9.52 mg/dL (H)). Liver Function Tests: Recent Labs  Lab 03/14/19 0700 03/14/19 0857 03/15/19 1443 03/16/19 0500 03/16/19 1235  AST  --  23  --   --  21  ALT 15 16  --   --  18  ALKPHOS  --  76  --   --  80  BILITOT  --  0.9  --   --  1.2  PROT  --  5.5*  --   --  5.0*  ALBUMIN  --  2.8* 2.2* 2.3* 2.3*   No results for input(s): LIPASE, AMYLASE in the last 168 hours. No results for input(s): AMMONIA in the last 168 hours. Coagulation Profile: No results for input(s): INR, PROTIME in the last 168 hours. Cardiac Enzymes: No results for input(s): CKTOTAL, CKMB, CKMBINDEX, TROPONINI in the last 168 hours. BNP (last 3 results) No results for input(s): PROBNP in the last 8760 hours. HbA1C: No results for input(s): HGBA1C in the last 72 hours. CBG: Recent Labs  Lab 03/15/19 2337 03/16/19 0022  GLUCAP 66* 165*   Lipid Profile: No results for input(s): CHOL, HDL, LDLCALC, TRIG, CHOLHDL, LDLDIRECT in the last 72 hours. Thyroid Function Tests: No results for input(s): TSH, T4TOTAL, FREET4, T3FREE, THYROIDAB in the last 72 hours. Anemia Panel: Recent Labs    03/14/19 0857 03/16/19 0500  FERRITIN 3,917* 4,450*   Sepsis Labs: Recent Labs  Lab 03/14/19 0621 03/14/19 0648  PROCALCITON 2.18  --   LATICACIDVEN  --  0.9    Recent Results (from the past 240 hour(s))  Culture, blood (routine x 2)     Status: None (Preliminary result)   Collection Time: 03/14/19  8:57 AM   Specimen: BLOOD  Result Value Ref Range Status   Specimen Description   Final    BLOOD RIGHT ANTECUBITAL Performed at [facility], [place] [address], [place], [place] [address]    Special Requests   Final    BOTTLES DRAWN AEROBIC AND ANAEROBIC Blood Culture  adequate volume Performed at [facility] [address] [place], [place] [address]    Culture   Final    NO GROWTH 2 DAYS Performed at [place] [address] [place], [place] [address]    Report Status PENDING  Incomplete  Culture, blood (routine x 2)     Status: None (Preliminary result)   Collection Time: 03/14/19  8:58 AM   Specimen: BLOOD  Result Value Ref Range Status   Specimen Description   Final    BLOOD BLOOD RIGHT FOREARM Performed at [facility] [address] [place], [place] [address]    Special Requests   Final    BOTTLES DRAWN AEROBIC AND ANAEROBIC Blood Culture adequate volume Performed at [facility] [address] [place], [place] [address]    Culture   Final    NO GROWTH 2 DAYS Performed at [facility] [address], [place], [place] [address]    Report Status PENDING  Incomplete  MRSA PCR Screening     Status: None   Collection Time: 03/15/19  3:08 AM   Specimen: Nasopharyngeal  Result Value Ref Range Status   MRSA by PCR NEGATIVE NEGATIVE Final    Comment:        The GeneXpert MRSA Assay (FDA approved for NASAL specimens only), is one component of a comprehensive MRSA colonization surveillance program. It is not intended to diagnose MRSA infection nor to guide or monitor treatment for MRSA infections. Performed at [facility], [place] [address] [place], [place] [address]   SARS CORONAVIRUS 2 (TAT 6-24 HRS) Nasopharyngeal Nasopharyngeal Swab     Status: Abnormal   Collection Time: 03/15/19  6:01 AM   Specimen: Nasopharyngeal Swab  Result Value Ref Range Status   SARS Coronavirus 2 POSITIVE (A) NEGATIVE Final    Comment: RESULT CALLED TO, READ BACK BY AND VERIFIED WITH: RN JOHN BRIDGES [phone] FCP (NOTE) SARS-CoV-2 target nucleic acids are DETECTED. The SARS-CoV-2 RNA is generally detectable in upper and lower respiratory specimens during the acute  phase of infection. Positive results are indicative of the presence of SARS-CoV-2 RNA. Clinical correlation with patient history and other diagnostic information is  necessary to determine patient infection status. Positive results do not rule out bacterial infection or co-infection with other viruses.  The expected result is Negative. Fact Sheet for Patients: SugarRoll.be Fact Sheet for Healthcare Providers: https://www.woods-mathews.com/ This test is not yet approved or cleared by the [place] FDA and  has been authorized for detection and/or diagnosis of SARS-CoV-2 by FDA under an Emergency Use Authorization (EUA). This EUA will remain  in effect (meaning this test can be used) for the dur ation of the COVID-19 declaration under Section 564(b)(1) of the Act, 21 U.S.C. section 360bbb-3(b)(1), unless the authorization is terminated or revoked sooner. Performed at [facility], [place] [address] [place], [place] [address]       Radiology Studies: No results found.   Scheduled Meds: . albuterol  2 puff Inhalation Q6H  . atorvastatin  10 mg Oral Daily  . Chlorhexidine Gluconate Cloth  6 each Topical Q0600  . dexamethasone (DECADRON) injection  6 mg Intravenous Q24H  . heparin injection (subcutaneous)  5,000 Units Subcutaneous Q8H  . mouth rinse  15 mL Mouth Rinse BID  . midodrine  10 mg Oral TID WC  . multivitamin  1 tablet Oral Daily  . sevelamer carbonate  2,400 mg Oral With snacks  . sevelamer carbonate  4,800 mg Oral TID WC   Continuous Infusions: . sodium chloride 1,000 mL (03/14/19 0851)  . cefTRIAXone (ROCEPHIN)  IV 1 g (03/16/19 0919)  . remdesivir 100 mg in NS 100 mL 100 mg (03/16/19 0824)     LOS:  2 days    Time spent: 25 minutes spent in the coordination of care today.    Jonnie Finner, DO [facility] Pager [phone]  If 7PM-7AM, please contact night-coverage [email or web address] Password  [facility] 03/16/2019, 4:26 PM

## 2019-03-16 NOTE — Progress Notes (Signed)
[facility] Progress Note  Subjective: seen in room, hurting "all over", doesn't feel good.  Has hx of "athritis".     Vitals:   03/16/19 0330 03/16/19 0513 03/16/19 0826 03/16/19 1150  BP: (!) 108/59 (!) 98/59 (!) 94/52 (!) 93/59  Pulse: (!) 56 (!) 54 (!) 57 (!) 57  Resp: 16 17 13 14   Temp: 97.8 F (36.6 C) 98.3 F (36.8 C) 98.1 F (36.7 C) 98.3 F (36.8 C)  TempSrc: Oral Oral Oral Oral  SpO2: 95% 95% 95% 93%  Weight: 109.8 kg     Height:        Inpatient medications: . albuterol  2 puff Inhalation Q6H  . atorvastatin  10 mg Oral Daily  . Chlorhexidine Gluconate Cloth  6 each Topical Q0600  . dexamethasone (DECADRON) injection  6 mg Intravenous Q24H  . heparin injection (subcutaneous)  5,000 Units Subcutaneous Q8H  . mouth rinse  15 mL Mouth Rinse BID  . midodrine  10 mg Oral TID WC  . multivitamin  1 tablet Oral Daily  . sevelamer carbonate  2,400 mg Oral With snacks  . sevelamer carbonate  4,800 mg Oral TID WC   . sodium chloride 1,000 mL (03/14/19 0851)  . cefTRIAXone (ROCEPHIN)  IV 1 g (03/16/19 0919)  . remdesivir 100 mg in NS 100 mL 100 mg (03/16/19 0824)   sodium chloride, acetaminophen, cyclobenzaprine, ondansetron **OR** ondansetron (ZOFRAN) IV    Exam: Gen alert, not in distress No jvd or bruits Chest clear bilat tp bases RRR no MRG Abd soft ntnd no mass or ascites +bs GU normal mal MS no joint effusions or deformity Ext no LE edema Neuro is lethargic but Ox 3 and responsive AVF +bruit    Home meds:  - midodrine 10 tid/ sevelamer carb ac tid  - lipitor 10/  aspirin 81  - januvia 25 mg qd  - prn's/ vitamins/ supplements  - pantoprazole 40    Outpt HD: MWF [facility]  4h   450/800  98kg  2/2 bath  AVF  Hep none  - parsabiv 7.5mg  tiw   - low UF 0.2, 0.8 recently    CXR - clear    Assessment/ Plan: 1. COVID +PNA: not hypoxic, CXR +infiltrate, low wbc, no fever.  Admitted 03/14/19 and started on IV abx , remdesivir and  decadron.    2. Chronic hypotension - BP's runs 70's - 80's in HD per patient. OP records show 70's- 90's at OP HD unit.   3. Volume - no edema on exam, CXR no edema 12/10. Doubt wts are accurate. UF 1.2 L last night.  4. ESRD - on HD MWF. Had HD last night 5. DM2  6. Anemia ckd - Hb 8.9, not on esa at center  7. MBD ckd - cont binders 8. EOL - multiple health issues so I talked to pt about life support and he feels at this time he would not want life support in case of arrest.  Have asked primary MD to discuss also w/ patient re: possible DNR status.      Rob Kalijah [name] 03/16/2019, 1:16 PM

## 2019-03-17 LAB — PHOSPHORUS: Phosphorus: 8.7 mg/dL — ABNORMAL HIGH (ref 2.5–4.6)

## 2019-03-17 LAB — FERRITIN: Ferritin: 3875 ng/mL — ABNORMAL HIGH (ref 24–336)

## 2019-03-17 LAB — CBC WITH DIFFERENTIAL/PLATELET
Abs Immature Granulocytes: 0.07 10*3/uL (ref 0.00–0.07)
Basophils Absolute: 0 10*3/uL (ref 0.0–0.1)
Basophils Relative: 0 %
Eosinophils Absolute: 0 10*3/uL (ref 0.0–0.5)
Eosinophils Relative: 0 %
HCT: 27.9 % — ABNORMAL LOW (ref 39.0–52.0)
Hemoglobin: 9.1 g/dL — ABNORMAL LOW (ref 13.0–17.0)
Immature Granulocytes: 6 %
Lymphocytes Relative: 7 %
Lymphs Abs: 0.1 10*3/uL — ABNORMAL LOW (ref 0.7–4.0)
MCH: 35.1 pg — ABNORMAL HIGH (ref 26.0–34.0)
MCHC: 32.6 g/dL (ref 30.0–36.0)
MCV: 107.7 fL — ABNORMAL HIGH (ref 80.0–100.0)
Monocytes Absolute: 0.1 10*3/uL (ref 0.1–1.0)
Monocytes Relative: 7 %
Neutro Abs: 1 10*3/uL — ABNORMAL LOW (ref 1.7–7.7)
Neutrophils Relative %: 80 %
Platelets: 88 10*3/uL — ABNORMAL LOW (ref 150–400)
RBC: 2.59 MIL/uL — ABNORMAL LOW (ref 4.22–5.81)
RDW: 14.6 % (ref 11.5–15.5)
WBC: 1.2 10*3/uL — CL (ref 4.0–10.5)
nRBC: 0 % (ref 0.0–0.2)

## 2019-03-17 LAB — COMPREHENSIVE METABOLIC PANEL
ALT: 17 U/L (ref 0–44)
AST: 20 U/L (ref 15–41)
Albumin: 2.5 g/dL — ABNORMAL LOW (ref 3.5–5.0)
Alkaline Phosphatase: 93 U/L (ref 38–126)
Anion gap: 22 — ABNORMAL HIGH (ref 5–15)
BUN: 90 mg/dL — ABNORMAL HIGH (ref 8–23)
CO2: 22 mmol/L (ref 22–32)
Calcium: 8.3 mg/dL — ABNORMAL LOW (ref 8.9–10.3)
Chloride: 101 mmol/L (ref 98–111)
Creatinine, Ser: 10.23 mg/dL — ABNORMAL HIGH (ref 0.61–1.24)
GFR calc Af Amer: 5 mL/min — ABNORMAL LOW (ref >60)
GFR calc non Af Amer: 5 mL/min — ABNORMAL LOW (ref >60)
Glucose, Bld: 192 mg/dL — ABNORMAL HIGH (ref 70–99)
Potassium: 4.3 mmol/L (ref 3.5–5.1)
Sodium: 145 mmol/L (ref 135–145)
Total Bilirubin: 0.9 mg/dL (ref 0.3–1.2)
Total Protein: 5.5 g/dL — ABNORMAL LOW (ref 6.5–8.1)

## 2019-03-17 LAB — C-REACTIVE PROTEIN: CRP: 19.2 mg/dL — ABNORMAL HIGH (ref <1.0)

## 2019-03-17 LAB — APTT: aPTT: 46 seconds — ABNORMAL HIGH (ref 24–36)

## 2019-03-17 LAB — PROTIME-INR
INR: 1.2 (ref 0.8–1.2)
Prothrombin Time: 15.2 seconds (ref 11.4–15.2)

## 2019-03-17 LAB — D-DIMER, QUANTITATIVE: D-Dimer, Quant: 4.31 ug/mL-FEU — ABNORMAL HIGH (ref 0.00–0.50)

## 2019-03-17 LAB — VITAMIN B12: Vitamin B-12: 2879 pg/mL — ABNORMAL HIGH (ref 180–914)

## 2019-03-17 LAB — MAGNESIUM: Magnesium: 2.4 mg/dL (ref 1.7–2.4)

## 2019-03-17 MED ORDER — WARFARIN SODIUM 7.5 MG PO TABS
7.5000 mg | ORAL_TABLET | Freq: Once | ORAL | Status: AC
  Administered 2019-03-17: 7.5 mg via ORAL
  Filled 2019-03-17: qty 1

## 2019-03-17 MED ORDER — CHLORHEXIDINE GLUCONATE CLOTH 2 % EX PADS: 6.0000 | MEDICATED_PAD | Freq: Every day | CUTANEOUS | Status: DC

## 2019-03-17 MED ORDER — WARFARIN - PHARMACIST DOSING INPATIENT
Freq: Every day | Status: DC
  Administered 2019-03-17 – 2019-03-18 (×2)

## 2019-03-17 NOTE — Progress Notes (Signed)
[name]  PROGRESS NOTE    [place] [name]  BHA:[id number] DOB: [birth year] DOA: 03/13/2019 PCP: [name], MD   Brief Narrative:   [name] a 68 y.o.malewith medical history significant ofESRD on dialysis TTS,B/L eyeblindness, chronic hypotension on daily midodrine,was sent to the EDfrom [facility] due to generalized weakness, denies any URT symptoms, Denies cough or SOB.Only c/o ofchronic neck, back and leg pains. Patient fell PTA with only complaints of a skin tear to his right forearm, also has sacral decubitus ulcer which has been hurting him. Noted to be hypotensive in ED with SBPs in the 70s and 80s, but patient reports thatits hisbaseline.Mentation intact,not tachycardic. Patient admitted for further management, awaiting transfer to [facility] for dialysis. Nephrology consulted.  03/15/19: Transferred from [facility] w/ [place] back pain. Chronic hypotension. Titrating O2 support to need. 03/16/19: Spoke with patient about long term AC. He has declined a decision at this time. Resume steroids.  03/17/19: Spoke with patient again about long term AC. He would like to try coumadin. Spoke with him about CODE status. He states that he would not want CPR/shocks/intubation. I explained that if he were to suffer respiratory collapse or his heart were to stop and we did not attempt these measures, that there would be no recovery and he would die. He states that he understands that fact. He status is changed to DNR. Notified by nursing that telemetry has seen 4 - 5 second pauses. Repeat EKG ordered. Previous EKG showed slow afib (has Hx of same). He is on midodrine which could cause some bradycardia; otherwise, he is on no other AV nodal agents. Spoke with cards. They will review.    Assessment & Plan:   Principal Problem:   COVID-19 virus infection Active Problems:   ESRD (end stage renal disease) on dialysis (HCC)   Hypotension of hemodialysis  Thrombocytopenia (HCC)   DM type 2 (diabetes mellitus, type 2) (HCC)   Bilateral blindness   Generalized weakness  Pneumonia due to COVID-19 virus - CXR: hazy airspace opacity at the right lung base - titrate O2 support to SpO2 92% - remdesivir (through 03/19/19), decadron, rocephin (procal elevated on 12/10 - continue through 03/18/19) - PRN ventolin inhalor - COVID 19+ - Bld Cx NTD - 03/17/19: inflammatory markers are trending down; currently requiring 3L [place]   Chronic hypotension - reports SBP runs between 70s 80s - No tachycardia noted, mentation normal, lactic acid 0.9 - continue home midodrine  ESRD - per nephrology, appreciate assistance  Hypokalemia - resolved, monitor  Anemia of chronic disease Macrocytosis Thrombocytopenia - no evidence of frank bleed, monitor     - check B12, THF     - stable, monitor  HLD - lipitor  Bradycardia Hx of afib (slow)     - last eval'd by cards 02/15/18: rec'd AC at the time, but pt declined answer and did not follow up     - Has agreed to coumadin; pharm consult for coumadin     - Notified of 4 - 5 second pauses on tele     - spoke with cards; they will review     - rpt EKG w/ sinus brady and 1st degree block; Qtc 430     - he's not on any AV nodal blocking agents, but is on midodrine for chronic hypotension     - lytes look ok, but he is a dialysis patient     - he denies any dizziness, weakness; mentation  is appropriate  DVT prophylaxis: Coumadin Code Status: DNR   Disposition Plan: TBD  Consultants:   Cardiology  Nephrology  Antimicrobials:  . Remdesivir, rocephin   ROS:  Denies CP, N, V, dyspnea, HA, ab pain, dizziness. Remainder 10-pt ROS is negative for all not previously mentioned.  Subjective: "Fine. That's fine."  Objective: Vitals:   03/17/19 1040 03/17/19 1045 03/17/19 1100 03/17/19 1115  BP: 116/66 119/65 (!) 111/53 (!) 103/51  Pulse:  (!) 54 (!) 54 (!) 53 (!) 44  Resp: 12 (!) 24 11 15   Temp:      TempSrc:      SpO2: 99% 99% 98% 96%  Weight:      Height:        Intake/Output Summary (Last 24 hours) at 03/17/2019 1128 Last data filed at 03/17/2019 0900 Gross per 24 hour  Intake 420 ml  Output --  Net 420 ml   Filed Weights   03/15/19 0346 03/16/19 0025 03/16/19 0330  Weight: 113.4 kg 111 kg 109.8 kg    Examination:  General: 68 y.o. male resting in bed in NAD Cardiovascular: brady, +S1, S2, no m/g/r Respiratory: soft rhonchi right base, normal WOB GI: BS+, NDNT, soft MSK: No e/c/c Neuro: A&O x 3, blind, follows commands Psyc: calm/cooperative   Data Reviewed: I have personally reviewed following labs and imaging studies.  CBC: Recent Labs  Lab 03/14/19 0102 03/15/19 1443 03/16/19 0500 03/17/19 0408  WBC 3.2* 2.0* 2.0* 1.2*  NEUTROABS 2.7 1.4* 1.4* 1.0*  HGB 8.9* 8.5* 8.9* 9.1*  HCT 27.6* 25.7* 26.9* 27.9*  MCV 111.7* 108.9* 108.9* 107.7*  PLT 64* 83* 89* 88*   Basic Metabolic Panel: Recent Labs  Lab 03/14/19 0102 03/15/19 1443 03/16/19 0500 03/16/19 1235 03/17/19 0408  NA 142 143 144 143 145  K 2.9* 3.4* 3.8 3.6 4.3  CL 97* 103 101 103 101  CO2 27 25 25 24 22   GLUCOSE 77 86 105* 152* 192*  BUN 51* 68* 72* 76* 90*  CREATININE 6.74* 9.09* 9.09* 9.52* 10.23*  CALCIUM 8.0* 7.5* 8.3* 8.0* 8.3*  MG  --   --  2.2  --   --   PHOS  --  7.2* 6.9*  --  8.7*   GFR: Estimated Creatinine Clearance: 9.3 mL/min (A) (by C-G formula based on SCr of 10.23 mg/dL (H)). Liver Function Tests: Recent Labs  Lab 03/14/19 0700 03/14/19 0857 03/15/19 1443 03/16/19 0500 03/16/19 1235 03/17/19 0408  AST  --  23  --   --  21 20  ALT 15 16  --   --  18 17  ALKPHOS  --  76  --   --  80 93  BILITOT  --  0.9  --   --  1.2 0.9  PROT  --  5.5*  --   --  5.0* 5.5*  ALBUMIN  --  2.8* 2.2* 2.3* 2.3* 2.5*   No results for input(s): LIPASE, AMYLASE in the last 168 hours. No results for input(s): AMMONIA  in the last 168 hours. Coagulation Profile: No results for input(s): INR, PROTIME in the last 168 hours. Cardiac Enzymes: No results for input(s): CKTOTAL, CKMB, CKMBINDEX, TROPONINI in the last 168 hours. BNP (last 3 results) No results for input(s): PROBNP in the last 8760 hours. HbA1C: No results for input(s): HGBA1C in the last 72 hours. CBG: Recent Labs  Lab 03/15/19 2337 03/16/19 0022  GLUCAP 66* 165*   Lipid Profile: No results for input(s): CHOL, HDL, LDLCALC,  TRIG, CHOLHDL, LDLDIRECT in the last 72 hours. Thyroid Function Tests: No results for input(s): TSH, T4TOTAL, FREET4, T3FREE, THYROIDAB in the last 72 hours. Anemia Panel: Recent Labs    03/16/19 0500 03/17/19 0408  FERRITIN 4,450* 3,875*   Sepsis Labs: Recent Labs  Lab 03/14/19 0621 03/14/19 0648  PROCALCITON 2.18  --   LATICACIDVEN  --  0.9    Recent Results (from the past 240 hour(s))  Culture, blood (routine x 2)     Status: None (Preliminary result)   Collection Time: 03/14/19  8:57 AM   Specimen: BLOOD  Result Value Ref Range Status   Specimen Description   Final    BLOOD RIGHT ANTECUBITAL Performed at [facility], [place] [address], [place], [place] [address]    Special Requests   Final    BOTTLES DRAWN AEROBIC AND ANAEROBIC Blood Culture adequate volume Performed at [facility] [address], [place], [place] [address]    Culture   Final    NO GROWTH 3 DAYS Performed at [facility], [place] [address], [place], [place] [address]    Report Status PENDING  Incomplete  Culture, blood (routine x 2)     Status: None (Preliminary result)   Collection Time: 03/14/19  8:58 AM   Specimen: BLOOD  Result Value Ref Range Status   Specimen Description   Final    BLOOD BLOOD RIGHT FOREARM Performed at [facility] [address], [place], [place] [address]    Special Requests   Final    BOTTLES DRAWN AEROBIC AND ANAEROBIC Blood Culture  adequate volume Performed at [facility] [address] [place], [place] [address]    Culture   Final    NO GROWTH 3 DAYS Performed at [facility], [place] [address], [place], [place] [address]    Report Status PENDING  Incomplete  MRSA PCR Screening     Status: None   Collection Time: 03/15/19  3:08 AM   Specimen: Nasopharyngeal  Result Value Ref Range Status   MRSA by PCR NEGATIVE NEGATIVE Final    Comment:        The GeneXpert MRSA Assay (FDA approved for NASAL specimens only), is one component of a comprehensive MRSA colonization surveillance program. It is not intended to diagnose MRSA infection nor to guide or monitor treatment for MRSA infections. Performed at [facility], [place] [address], [place], [place] [address]   SARS CORONAVIRUS 2 (TAT 6-24 HRS) Nasopharyngeal Nasopharyngeal Swab     Status: Abnormal   Collection Time: 03/15/19  6:01 AM   Specimen: Nasopharyngeal Swab  Result Value Ref Range Status   SARS Coronavirus 2 POSITIVE (A) NEGATIVE Final    Comment: RESULT CALLED TO, READ BACK BY AND VERIFIED WITH: RN JOHN BRIDGES [phone] FCP (NOTE) SARS-CoV-2 target nucleic acids are DETECTED. The SARS-CoV-2 RNA is generally detectable in upper and lower respiratory specimens during the acute phase of infection. Positive results are indicative of the presence of SARS-CoV-2 RNA. Clinical correlation with patient history and other diagnostic information is  necessary to determine patient infection status. Positive results do not rule out bacterial infection or co-infection with other viruses.  The expected result is Negative. Fact Sheet for Patients: SugarRoll.be Fact Sheet for Healthcare Providers: https://www.woods-mathews.com/ This test is not yet approved or cleared by the [place] FDA and  has been authorized for detection and/or diagnosis of SARS-CoV-2 by FDA under an Emergency  Use Authorization (EUA). This EUA will remain  in effect (meaning this test can be used) for the dur ation of the COVID-19 declaration under Section 564(b)(1) of the Act, 21 U.S.C. section 360bbb-3(b)(1), unless the authorization is terminated or revoked sooner. Performed at [facility], [place] [address], [place], [place] [address]       Radiology Studies: No results found.   Scheduled Meds: . albuterol  2 puff Inhalation Q6H  . atorvastatin  10 mg Oral Daily  . Chlorhexidine Gluconate Cloth  6 each Topical Q0600  . dexamethasone (DECADRON) injection  6 mg Intravenous Q24H  . heparin injection (subcutaneous)  5,000 Units Subcutaneous Q8H  . mouth rinse  15 mL Mouth Rinse BID  . midodrine  10 mg Oral TID WC  . multivitamin  1 tablet Oral Daily  . sevelamer carbonate  2,400 mg Oral With snacks  . sevelamer carbonate  4,800 mg Oral TID WC   Continuous Infusions: . sodium chloride 1,000 mL (03/14/19 0851)  . cefTRIAXone (ROCEPHIN)  IV Stopped (03/17/19 0916)  . remdesivir 100 mg in NS 100 mL 100 mg (03/17/19 0916)     LOS: 3 days    Time spent: 25 minutes spent in the coordination of care today.    Jonnie Finner, DO [facility] Pager [phone]  If 7PM-7AM, please contact night-coverage [email or web address] Password [email or web address] 03/17/2019, 11:28 AM

## 2019-03-17 NOTE — Progress Notes (Signed)
[name]  Subjective:  Patient not examined today directly given COVID-19 + status, utilizing exam of the primary team and observations of RN's.      Vitals:   03/17/19 1045 03/17/19 1100 03/17/19 1115 03/17/19 1215  BP: 119/65 (!) 111/53 (!) 103/51   Pulse: (!) 54 (!) 53 (!) 44   Resp: (!) 24 11 15    Temp:    97.7 F (36.5 C)  TempSrc:    Axillary  SpO2: 99% 98% 96%   Weight:      Height:        Inpatient medications: . albuterol  2 puff Inhalation Q6H  . atorvastatin  10 mg Oral Daily  . Chlorhexidine Gluconate Cloth  6 each Topical Q0600  . dexamethasone (DECADRON) injection  6 mg Intravenous Q24H  . heparin injection (subcutaneous)  5,000 Units Subcutaneous Q8H  . mouth rinse  15 mL Mouth Rinse BID  . midodrine  10 mg Oral TID WC  . multivitamin  1 tablet Oral Daily  . sevelamer carbonate  2,400 mg Oral With snacks  . sevelamer carbonate  4,800 mg Oral TID WC  . warfarin  7.5 mg Oral ONCE-1800  . Warfarin - Pharmacist Dosing Inpatient   Does not apply q1800   . sodium chloride 1,000 mL (03/14/19 0851)  . cefTRIAXone (ROCEPHIN)  IV Stopped (03/17/19 0916)  . remdesivir 100 mg in NS 100 mL Stopped (03/17/19 1208)   sodium chloride, acetaminophen, cyclobenzaprine, ondansetron **OR** ondansetron (ZOFRAN) IV    Exam: Gen alert, not in distress No jvd or bruits Chest clear bilat tp bases RRR no MRG Abd soft ntnd no mass or ascites +bs GU normal mal MS no joint effusions or deformity Ext no LE edema Neuro is lethargic but Ox 3 and responsive AVF +bruit    Home meds:  - midodrine 10 tid/ sevelamer carb ac tid  - lipitor 10/  aspirin 81  - januvia 25 mg qd  - prn's/ vitamins/ supplements  - pantoprazole 40    Outpt HD: MWF [facility]  4h   450/800  98kg  2/2 bath  AVF  Hep none  - parsabiv 7.5mg  tiw   - low UF 0.2, 0.8 recently    CXR 12/10 - no edema, RLL infiltrate   Assessment/ Plan: 1. COVID +PNA: admit 12/10, not  hypoxic, CXR +infiltrate, low wbc, no fever.  Admitted 03/14/19 and started on IV abx , remdesivir and decadron.    2. Chronic hypotension - BP's runs 70's -90's at OP HD, midodrine 10 tid 3. Volume - no edema on exam, CXR no edema 12/10. Doubt wts are accurate. UF 1.2 L on Friday's HD.  4. ESRD - on HD MWF. Next HD Monday.  5. DM2  6. Anemia ckd - Hb 8.9, not on esa at center  7. MBD ckd - cont binders 8. EOL - multiple comorbidities, pt is now DNR.      [name] 03/17/2019, 1:04 PM

## 2019-03-17 NOTE — Progress Notes (Signed)
ANTICOAGULATION CONSULT NOTE - Initial Consult  Pharmacy Consult for warfarin Indication: atrial fibrillation  Allergies  Allergen Reactions  . Codeine Other (See Comments)     Vega  . Tape Other (See Comments)    Plastic tape tears skin off, please use paper tape instead.    Patient Measurements: Height: 6\' 4"  (193 cm) Weight: 242 lb 1 oz (109.8 kg) IBW/kg (Calculated) : 86.8  Vital Signs: Temp: 98.4 F (36.9 C) (12/13 0800) Temp Source: Oral (12/13 0800) BP: 103/51 (12/13 1115) Pulse Rate: 44 (12/13 1115)  Labs: Recent Labs    03/15/19 1443 03/16/19 0500 03/16/19 1235 03/17/19 0408  HGB 8.5* 8.9*  --  9.1*  HCT 25.7* 26.9*  --  27.9*  PLT 83* 89*  --  88*  CREATININE 9.09* 9.09* 9.52* 10.23*    Estimated Creatinine Clearance: 9.3 mL/min (A) (by C-G formula based on SCr of 10.23 mg/dL (H)).   Medical History: Past Medical History:  Diagnosis Date  . Anemia   . Arthritis    "back, neck" (07/28/2014)  . Blind    both eyes removed   . Bruises easily   . CAD, NATIVE VESSEL 01/08/2008      . Cellulitis late 1980's   "hospitalized; wrapped both legs; several times; no OR for this"  . Colon polyps   . Diabetic neuropathy ([place])   . Dialysis patient ([place])   . DM type 2 (diabetes mellitus, type 2) ([place])    no medications (07/28/2014) diet and excersie controlled not onmeds for 14-15 years   . Dysrhythmia    afib   . ESRD (end stage renal disease) on dialysis [facility])    [name]; Mon, Wed, Fri (07/28/2014)  . Fall    hx of fall and required left hip surgery   . Family history of breast cancer    mother  . Heart murmur    hx of  . History of blood product transfusion   . HYPERLIPIDEMIA-MIXED 01/08/2008  . HYPERTENSION 01/27/2009   BP low , hx of HTN, Currently on meds to raise BP  . Hypotension   . Kidney stones   . Low blood pressure   . OVERWEIGHT/OBESITY 01/08/2008   Lost 205 lbs through diet and exercise.    . Peripheral vascular  disease ([place])    vein stripping  . Pneumonia 2000;s X 1  . Prostate cancer ([place])   . Prosthetic eye globe    both eyes  . Renal cell carcinoma 2001 and 2003   "both kidneys"  . Renal insufficiency      Assessment: 68 yo male presented on 03/13/2019 with weakness found to be positive for COVID-19. Patient has a history of slow Afib and was evaluated by cardiology on 02/15/2018 and recommended to begin anticoagulation but the patient did not follow up. Pharmacy has been consulted to dose warfarin for Afib. CHAD2-VASc score of 3. Patient is currently in AFib with HR in 34s. Hgb 9.1. Plt 88 (at approximate baseline). No reported bleeding   Goal of Therapy:  INR 2-3 Monitor platelets by anticoagulation protocol: Yes   Plan:  Warfarin 7.5mg  x1 tonight  Obtain baseline PT, INR and aPTT Monitor INR and S/S of bleeding daily  Pharmacy will provide education   Cristela Felt, PharmD PGY1 Pharmacy Resident [employer]: [phone]   03/17/2019,11:49 AM

## 2019-03-17 NOTE — Progress Notes (Addendum)
Received call from centralized telemetry that patient had 4.5 second pause. Upon assessment, pt irritable and said "oh god!" when I asked him how he was feeling. BP 117/62. Currently brady in 30's. Dr. Marylyn Ishihara and rapid response RN called to make aware of situation.

## 2019-03-17 NOTE — Progress Notes (Signed)
Patient refusing renvela intermittently due to not having an appetite and refusing meals. Patient has very poor appetite to start with. Will continue to monitor.

## 2019-03-17 NOTE — Significant Event (Signed)
Rapid Response Event Note  Overview: Bradycardia  RN called for bradycardia and pauses >4 sec. Patient asymptomatic, but advised to put pads on and place patient on [place].    Arrived to unit while donning, RN had spoken to MD who had changed patient's code status to DNR.      Initial Focused Assessment: Patient lying in bed, irritable but answers questions. Patient states that he 'feels awful'. BP runs hypotensive, on midodrine. HR 50's.    Interventions: --EKG done, junctional rhythm   Plan of Care (if not transferred): --Will consult cardiology for further management  --Ensured Code status updated in Epic and family has been contacted.   Call RRT as needed.   Event Summary: Call End:  [place]    Glorian Mcdonell, Lattie Haw

## 2019-03-17 NOTE — Progress Notes (Signed)
Asked to review [name] telemetry by hospital medicine. Around 10:12 AM he had a 4.5 second pause. His rhythm is atrial fibrillation with SVR. He has a history of ESRD on HD, Cad s/p CABG, chronic hypotension on midodrine on 12/10 for covid pneumonia.   He has a known history of Afib with SVR and episodic 2:1 block. He has been turned down for ppm due to high risk of infection. He has a known history of heart rates in the 30s without major symptoms.   I discussed his case with nursing staff and did not exam [name] due to the Covid 19 pandemic. They report he is not symptomatic from his pause this AM or bradycardia. He will wake up and talk with staff appropriately.   For now, no indication for pacing. Would continue to hold any AV nodal agents. Continue supportive treatment for PNA. We will have EP reach out tomorrow about any other recommendations.   [place] T. Audie Box, [place]  [address] [place], [place] [address] [phone]  4:10 PM

## 2019-03-17 NOTE — Progress Notes (Signed)
CRITICAL VALUE ALERT  Critical Value:  White count 1.2  Date & Time Notied:  03/17/2019 at 0442  Provider Notified: NP X. Blount  Orders Received/Actions taken: no new order receive

## 2019-03-17 NOTE — Progress Notes (Signed)
Patient's son [name] updated on patient's condition, including episodes of bradycardia. All questions answered.

## 2019-03-18 DIAGNOSIS — R001 Bradycardia, unspecified: Secondary | ICD-10-CM

## 2019-03-18 DIAGNOSIS — I472 Ventricular tachycardia: Secondary | ICD-10-CM

## 2019-03-18 LAB — RENAL FUNCTION PANEL
Albumin: 2.5 g/dL — ABNORMAL LOW (ref 3.5–5.0)
Anion gap: 22 — ABNORMAL HIGH (ref 5–15)
BUN: 116 mg/dL — ABNORMAL HIGH (ref 8–23)
CO2: 21 mmol/L — ABNORMAL LOW (ref 22–32)
Calcium: 8.6 mg/dL — ABNORMAL LOW (ref 8.9–10.3)
Chloride: 100 mmol/L (ref 98–111)
Creatinine, Ser: 11.35 mg/dL — ABNORMAL HIGH (ref 0.61–1.24)
GFR calc Af Amer: 5 mL/min — ABNORMAL LOW (ref >60)
GFR calc non Af Amer: 4 mL/min — ABNORMAL LOW (ref >60)
Glucose, Bld: 260 mg/dL — ABNORMAL HIGH (ref 70–99)
Phosphorus: 9.7 mg/dL — ABNORMAL HIGH (ref 2.5–4.6)
Potassium: 4.3 mmol/L (ref 3.5–5.1)
Sodium: 143 mmol/L (ref 135–145)

## 2019-03-18 LAB — CBC WITH DIFFERENTIAL/PLATELET
Abs Immature Granulocytes: 0.14 10*3/uL — ABNORMAL HIGH (ref 0.00–0.07)
Basophils Absolute: 0 10*3/uL (ref 0.0–0.1)
Basophils Relative: 0 %
Eosinophils Absolute: 0 10*3/uL (ref 0.0–0.5)
Eosinophils Relative: 0 %
HCT: 28 % — ABNORMAL LOW (ref 39.0–52.0)
Hemoglobin: 9.4 g/dL — ABNORMAL LOW (ref 13.0–17.0)
Immature Granulocytes: 4 %
Lymphocytes Relative: 4 %
Lymphs Abs: 0.1 10*3/uL — ABNORMAL LOW (ref 0.7–4.0)
MCH: 35.7 pg — ABNORMAL HIGH (ref 26.0–34.0)
MCHC: 33.6 g/dL (ref 30.0–36.0)
MCV: 106.5 fL — ABNORMAL HIGH (ref 80.0–100.0)
Monocytes Absolute: 0.2 10*3/uL (ref 0.1–1.0)
Monocytes Relative: 6 %
Neutro Abs: 3.1 10*3/uL (ref 1.7–7.7)
Neutrophils Relative %: 86 %
Platelets: 114 10*3/uL — ABNORMAL LOW (ref 150–400)
RBC: 2.63 MIL/uL — ABNORMAL LOW (ref 4.22–5.81)
RDW: 14.6 % (ref 11.5–15.5)
WBC: 3.6 10*3/uL — ABNORMAL LOW (ref 4.0–10.5)
nRBC: 0 % (ref 0.0–0.2)

## 2019-03-18 LAB — D-DIMER, QUANTITATIVE: D-Dimer, Quant: 3.41 ug/mL-FEU — ABNORMAL HIGH (ref 0.00–0.50)

## 2019-03-18 LAB — PROTIME-INR
INR: 1.4 — ABNORMAL HIGH (ref 0.8–1.2)
Prothrombin Time: 16.8 seconds — ABNORMAL HIGH (ref 11.4–15.2)

## 2019-03-18 LAB — C-REACTIVE PROTEIN: CRP: 13.4 mg/dL — ABNORMAL HIGH (ref <1.0)

## 2019-03-18 LAB — FERRITIN: Ferritin: 3223 ng/mL — ABNORMAL HIGH (ref 24–336)

## 2019-03-18 LAB — MAGNESIUM: Magnesium: 2.5 mg/dL — ABNORMAL HIGH (ref 1.7–2.4)

## 2019-03-18 MED ORDER — WARFARIN SODIUM 7.5 MG PO TABS
7.5000 mg | ORAL_TABLET | Freq: Once | ORAL | Status: AC
  Administered 2019-03-18: 7.5 mg via ORAL
  Filled 2019-03-18: qty 1

## 2019-03-18 MED ORDER — MIDODRINE HCL 5 MG PO TABS
5.0000 mg | ORAL_TABLET | Freq: Three times a day (TID) | ORAL | Status: DC
  Administered 2019-03-18 – 2019-03-20 (×6): 5 mg via ORAL
  Filled 2019-03-18 (×6): qty 1

## 2019-03-18 NOTE — Progress Notes (Signed)
Report given to [facility] in dialysis.

## 2019-03-18 NOTE — Progress Notes (Signed)
ANTICOAGULATION CONSULT NOTE - Initial Consult  Pharmacy Consult for warfarin Indication: atrial fibrillation  Allergies  Allergen Reactions  . Codeine Other (See Comments)     [name]  . Tape Other (See Comments)    Plastic tape tears skin off, please use paper tape instead.    Patient Measurements: Height: 6\' 4"  (193 cm) Weight: 242 lb 1 oz (109.8 kg) IBW/kg (Calculated) : 86.8  Vital Signs: Temp: 97.9 F (36.6 C) (12/14 0458) Temp Source: Axillary (12/14 0458) BP: 108/42 (12/14 0600) Pulse Rate: 53 (12/14 0606)  Labs: Recent Labs    03/16/19 0500 03/16/19 1235 03/17/19 0408 03/17/19 1149 03/18/19 0458  HGB 8.9*  --  9.1*  --  9.4*  HCT 26.9*  --  27.9*  --  28.0*  PLT 89*  --  88*  --  114*  APTT  --   --   --  46*  --   LABPROT  --   --   --  15.2 16.8*  INR  --   --   --  1.2 1.4*  CREATININE 9.09* 9.52* 10.23*  --  11.35*    Estimated Creatinine Clearance: 8.3 mL/min (A) (by C-G formula based on SCr of 11.35 mg/dL (H)).   Medical History: Past Medical History:  Diagnosis Date  . Anemia   . Arthritis    "back, neck" (07/28/2014)  . Blind    both eyes removed   . Bruises easily   . CAD, NATIVE VESSEL 01/08/2008      . Cellulitis late 1980's   "hospitalized; wrapped both legs; several times; no OR for this"  . Colon polyps   . Diabetic neuropathy ([place])   . Dialysis patient ([place])   . DM type 2 (diabetes mellitus, type 2) ([place])    no medications (07/28/2014) diet and excersie controlled not onmeds for 14-15 years   . Dysrhythmia    afib   . ESRD (end stage renal disease) on dialysis [facility])    [name]; Mon, Wed, Fri (07/28/2014)  . Fall    hx of fall and required left hip surgery   . Family history of breast cancer    mother  . Heart murmur    hx of  . History of blood product transfusion   . HYPERLIPIDEMIA-MIXED 01/08/2008  . HYPERTENSION 01/27/2009   BP low , hx of HTN, Currently on meds to raise BP  . Hypotension   .  Kidney stones   . Low blood pressure   . OVERWEIGHT/OBESITY 01/08/2008   Lost 205 lbs through diet and exercise.    . Peripheral vascular disease ([place])    vein stripping  . Pneumonia 2000;s X 1  . Prostate cancer ([place])   . Prosthetic eye globe    both eyes  . Renal cell carcinoma 2001 and 2003   "both kidneys"  . Renal insufficiency      Assessment: 68 yo male presented on 03/13/2019 with weakness found to be positive for COVID-19. Patient has a history of slow Afib and was evaluated by cardiology on 02/15/2018 and recommended to begin anticoagulation but the patient did not follow up. Pharmacy has been consulted to dose warfarin for Afib. CHAD2-VASc score of 3. Patient is currently in AFib with HR in 77s. Hgb 9.1. Plt 88 (at approximate baseline). No reported bleeding   INR 1.2>>1.4  Goal of Therapy:  INR 2-3 Monitor platelets by anticoagulation protocol: Yes   Plan:  Warfarin 7.5mg  x1 tonight  Monitor INR and S/S of bleeding daily    [name] A. Levada Dy, PharmD, BCPS, FNKF Clinical Pharmacist [place] Please utilize Amion for appropriate phone number to reach the unit pharmacist ([place])     03/18/2019,7:39 AM

## 2019-03-18 NOTE — Progress Notes (Signed)
Patient treatment ended early with .45 minutes on of a 3.5 hour treatment  will resume later on tonight.

## 2019-03-18 NOTE — Progress Notes (Signed)
[name]  PROGRESS NOTE    [name]  CLE:[id number] DOB: 11/30/1950 DOA: 03/13/2019 PCP: [name], MD   Brief Narrative:   [name] a 68 y.o.malewith medical history significant ofESRD on dialysis TTS,B/L eyeblindness, chronic hypotension on daily midodrine,was sent to the EDfrom [facility] due to generalized weakness, denies any URT symptoms, Denies cough or SOB.Only c/o ofchronic neck, back and leg pains. Patient fell PTA with only complaints of a skin tear to his right forearm, also has sacral decubitus ulcer which has been hurting him. Noted to be hypotensive in ED with SBPs in the 70s and 80s, but patient reports thatits hisbaseline.Mentation intact,not tachycardic. Patient admitted for further management, awaiting transfer to [facility] for dialysis. Nephrology consulted.  03/18/19: Notified of 30+ beat run of VTach. Spoke with EP. They will see. Last day of remdesivir. Decadron continues. Still on 3L [place]; wean.    Assessment & Plan:   Principal Problem:   COVID-19 virus infection Active Problems:   ESRD (end stage renal disease) on dialysis (HCC)   Hypotension of hemodialysis   Thrombocytopenia (HCC)   DM type 2 (diabetes mellitus, type 2) (HCC)   Bilateral blindness   Generalized weakness  Pneumonia due to COVID-19 virus - CXR: hazy airspace opacity at the right lung base - titrate O2 support to SpO2 92% - remdesivir (through 03/19/19),decadron, rocephin (ends today) - PRN ventolin inhalor - COVID 19+ - Bld Cx NTD - 03/18/19: inflammatory markers are trending down; currently on 3L [place]; continue to wean   Chronic hypotension - reports SBP runs between 70s 80s - No tachycardia noted, mentation normal, lactic acid 0.9 - continue home midodrine  ESRD - per nephrology, appreciate assistance  Hypokalemia -resolved, monitor  Anemia of chronic  disease Macrocytosis Thrombocytopenia - no evidence of frank bleed, monitor - B12: 2879, THF pending     - Hgb is stable, monitor  HLD - lipitor  Bradycardia Hx of afib (slow) - last eval'd by cards 02/15/18: rec'd AC at the time, but pt declined answer and did not follow up  - Has agreed to coumadin; pharm consult for coumadin     - Notified of 4 - 5 second pauses on tele     - spoke with cards; they will review     - rpt EKG w/ sinus brady and 1st degree block; Qtc 430     - he's not on any AV nodal blocking agents, but is on midodrine for chronic hypotension     - lytes look ok, but he is a dialysis patient     - he denies any dizziness, weakness; mentation is appropriate     - 03/18/19: cards and EP have seen; not a candidate for amiodarone, BB and poor candidate for EP procedure; has previously turned down ppm d/t risks     - continue to monitor   Remdes complete. Once weaned from O2, should be good to go back to SNF. Will need to complete 21 day isolation from 1st positive (03/15/19)  DVT prophylaxis: coumadin Code Status: DNR   Disposition Plan: To NH when off O2  Consultants:   Cardiology  Nephrology  Antimicrobials:  . Remdesivir, rocephin   ROS:  Denies N, V, dyspnea, CP. Remainder 10-pt ROS is negative for all not previously mentioned.  Subjective: "Oh, you think I'll go soon?"  Objective: Vitals:   03/18/19 0605 03/18/19 0606 03/18/19 0737 03/18/19 1200  BP:   (!) 113/40 123/60  Pulse: [name])  53 (!) 53 (!) 34 (!) 54  Resp: 14 16 14 16   Temp:   98.1 F (36.7 C) 97.7 F (36.5 C)  TempSrc:   Oral Axillary  SpO2: 100% 100% 100% 99%  Weight:      Height:        Intake/Output Summary (Last 24 hours) at 03/18/2019 1351 Last data filed at 03/18/2019 1610 Gross per 24 hour  Intake 282.58 ml  Output --  Net 282.58 ml   Filed Weights   03/15/19 0346 03/16/19 0025 03/16/19 0330  Weight: 113.4 kg 111 kg 109.8 kg     Examination:  General: 68 y.o. male resting in bed in NAD Cardiovascular: brady, +s1s2, no m/g/r, equal pulses throughout Respiratory: clear, no w/r/r, normal WOB GI: BS+, NDNT, no masses noted MSK: No e/c/c Neuro: alert to name, follows commands, blind Psyc: Appropriate interaction and affect, calm/cooperative   Data Reviewed: I have personally reviewed following labs and imaging studies.  CBC: Recent Labs  Lab 03/14/19 0102 03/15/19 1443 03/16/19 0500 03/17/19 0408 03/18/19 0458  WBC 3.2* 2.0* 2.0* 1.2* 3.6*  NEUTROABS 2.7 1.4* 1.4* 1.0* 3.1  HGB 8.9* 8.5* 8.9* 9.1* 9.4*  HCT 27.6* 25.7* 26.9* 27.9* 28.0*  MCV 111.7* 108.9* 108.9* 107.7* 106.5*  PLT 64* 83* 89* 88* 960*   Basic Metabolic Panel: Recent Labs  Lab 03/15/19 1443 03/16/19 0500 03/16/19 1235 03/17/19 0408 03/17/19 1114 03/18/19 0458  NA 143 144 143 145  --  143  K 3.4* 3.8 3.6 4.3  --  4.3  CL 103 101 103 101  --  100  CO2 25 25 24 22   --  21*  GLUCOSE 86 105* 152* 192*  --  260*  BUN 68* 72* 76* 90*  --  116*  CREATININE 9.09* 9.09* 9.52* 10.23*  --  11.35*  CALCIUM 7.5* 8.3* 8.0* 8.3*  --  8.6*  MG  --  2.2  --   --  2.4 2.5*  PHOS 7.2* 6.9*  --  8.7*  --  9.7*   GFR: Estimated Creatinine Clearance: 8.3 mL/min (A) (by C-G formula based on SCr of 11.35 mg/dL (H)). Liver Function Tests: Recent Labs  Lab 03/14/19 0700 03/14/19 0857 03/15/19 1443 03/16/19 0500 03/16/19 1235 03/17/19 0408 03/18/19 0458  AST  --  23  --   --  21 20  --   ALT 15 16  --   --  18 17  --   ALKPHOS  --  76  --   --  80 93  --   BILITOT  --  0.9  --   --  1.2 0.9  --   PROT  --  5.5*  --   --  5.0* 5.5*  --   ALBUMIN  --  2.8* 2.2* 2.3* 2.3* 2.5* 2.5*   No results for input(s): LIPASE, AMYLASE in the last 168 hours. No results for input(s): AMMONIA in the last 168 hours. Coagulation Profile: Recent Labs  Lab 03/17/19 1149 03/18/19 0458  INR 1.2 1.4*   Cardiac Enzymes: No results for input(s):  CKTOTAL, CKMB, CKMBINDEX, TROPONINI in the last 168 hours. BNP (last 3 results) No results for input(s): PROBNP in the last 8760 hours. HbA1C: No results for input(s): HGBA1C in the last 72 hours. CBG: Recent Labs  Lab 03/15/19 2337 03/16/19 0022  GLUCAP 66* 165*   Lipid Profile: No results for input(s): CHOL, HDL, LDLCALC, TRIG, CHOLHDL, LDLDIRECT in the last 72 hours. Thyroid Function Tests: No  results for input(s): TSH, T4TOTAL, FREET4, T3FREE, THYROIDAB in the last 72 hours. Anemia Panel: Recent Labs    03/17/19 0408 03/17/19 1208 03/18/19 0458  VITAMINB12  --  2,879*  --   FERRITIN 3,875*  --  3,223*   Sepsis Labs: Recent Labs  Lab 03/14/19 0621 03/14/19 0648  PROCALCITON 2.18  --   LATICACIDVEN  --  0.9    Recent Results (from the past 240 hour(s))  Culture, blood (routine x 2)     Status: None (Preliminary result)   Collection Time: 03/14/19  8:57 AM   Specimen: BLOOD  Result Value Ref Range Status   Specimen Description   Final    BLOOD RIGHT ANTECUBITAL Performed at [facility], [place] [address] [place], [place] [address]    Special Requests   Final    BOTTLES DRAWN AEROBIC AND ANAEROBIC Blood Culture adequate volume Performed at [place] [address] [place], [place] [address]    Culture   Final    NO GROWTH 4 DAYS Performed at [facility], [place] [address] [place], [place] [address]    Report Status PENDING  Incomplete  Culture, blood (routine x 2)     Status: None (Preliminary result)   Collection Time: 03/14/19  8:58 AM   Specimen: BLOOD  Result Value Ref Range Status   Specimen Description   Final    BLOOD BLOOD RIGHT FOREARM Performed at [place] [address], [place], [place] [address]    Special Requests   Final    BOTTLES DRAWN AEROBIC AND ANAEROBIC Blood Culture adequate volume Performed at [facility] [address], [place], [place]  [address]    Culture   Final    NO GROWTH 4 DAYS Performed at [facility], [place] [address], [place], [place] [address]    Report Status PENDING  Incomplete  MRSA PCR Screening     Status: None   Collection Time: 03/15/19  3:08 AM   Specimen: Nasopharyngeal  Result Value Ref Range Status   MRSA by PCR NEGATIVE NEGATIVE Final    Comment:        The GeneXpert MRSA Assay (FDA approved for NASAL specimens only), is one component of a comprehensive MRSA colonization surveillance program. It is not intended to diagnose MRSA infection nor to guide or monitor treatment for MRSA infections. Performed at [facility], [place] [address] [place], [place] [address]   SARS CORONAVIRUS 2 (TAT 6-24 HRS) Nasopharyngeal Nasopharyngeal Swab     Status: Abnormal   Collection Time: 03/15/19  6:01 AM   Specimen: Nasopharyngeal Swab  Result Value Ref Range Status   SARS Coronavirus 2 POSITIVE (A) NEGATIVE Final    Comment: RESULT CALLED TO, READ BACK BY AND VERIFIED WITH: RN JOHN BRIDGES [phone] FCP (NOTE) SARS-CoV-2 target nucleic acids are DETECTED. The SARS-CoV-2 RNA is generally detectable in upper and lower respiratory specimens during the acute phase of infection. Positive results are indicative of the presence of SARS-CoV-2 RNA. Clinical correlation with patient history and other diagnostic information is  necessary to determine patient infection status. Positive results do not rule out bacterial infection or co-infection with other viruses.  The expected result is Negative. Fact Sheet for Patients: SugarRoll.be Fact Sheet for Healthcare Providers: https://www.woods-mathews.com/ This test is not yet approved or cleared by the [place] FDA and  has been authorized for detection and/or diagnosis of SARS-CoV-2 by FDA under  an Emergency Use Authorization (EUA). This EUA will remain  in effect (meaning this test can be used) for the dur  ation of the COVID-19 declaration under Section 564(b)(1) of the Act, 21 U.S.C. section 360bbb-3(b)(1), unless the authorization is terminated or revoked sooner. Performed at [facility], [place] [address], [place], [place] [address]       Radiology Studies: No results found.   Scheduled Meds: . albuterol  2 puff Inhalation Q6H  . atorvastatin  10 mg Oral Daily  . Chlorhexidine Gluconate Cloth  6 each Topical Q0600  . Chlorhexidine Gluconate Cloth  6 each Topical Q0600  . dexamethasone (DECADRON) injection  6 mg Intravenous Q24H  . mouth rinse  15 mL Mouth Rinse BID  . midodrine  5 mg Oral TID WC  . multivitamin  1 tablet Oral Daily  . sevelamer carbonate  2,400 mg Oral With snacks  . sevelamer carbonate  4,800 mg Oral TID WC  . warfarin  7.5 mg Oral ONCE-1800  . Warfarin - Pharmacist Dosing Inpatient   Does not apply q1800   Continuous Infusions: . sodium chloride 1,000 mL (03/14/19 0851)     LOS: 4 days    Time spent: 25 minutes spent in the coordination of care today.    Jonnie Finner, DO [facility] Pager [phone]  If 7PM-7AM, please contact night-coverage [email or web address] Password [email or web address] 03/18/2019, 1:51 PM

## 2019-03-18 NOTE — Progress Notes (Signed)
Patient refusing any lunch or snacks at this time. renvela held.

## 2019-03-18 NOTE — Consult Note (Addendum)
ELECTROPHYSIOLOGY CONSULT NOTE    Patient ID: [name] MRN: [id number], DOB/AGE: 1950-08-25 68 y.o.  Admit date: 03/13/2019 Date of Consult: 03/18/2019  Primary Physician: [name], MD Primary Cardiologist: No primary care provider on file.  Electrophysiologist: New   Referring Provider: Dr. Audie Box  Patient Profile: [name] is a 68 y.o. male with a history of COVID infection (Positive 03/15/2019), ESRD on HD T/Th/S, Blindness, Chronic hypotension on midodrine, CAD x/p CABG 2013, variable AV block, and persistent atria lfibrillation who is being seen today for the evaluation of tachy-brady syndrome in AF RVR at the request of Dr. Audie Box.  HPI:  [name] is a 68 y.o. male with very complicated history as above. Admitted 12/10 for generalized weakness and testing positive for COVID.  Cardiology was consulted 03/17/2019 with a 4.5 second pause ~ 1012 am. Otherwise has been in AFIB with slow ventricular rate.   Per chart, he has previously been turned down for pacemaker due to high risk of infection. He has a known history of episodic 2:1 block, and history of heart rates in the 30s with no limiting symptoms.  He is not on any AV nodal agents.  Pt states he has had a "double bypass" and "open heart surgery" and has previously thought to be too high risk to be a PPM candidate by Dr. Percival Spanish, from what he remembers. He uses a rollator to "get around" the best he can. He has lived in [facility] due to generalized weakness for ~ the past year. He denies dizziness, lightheadedness, near syncope, or syncope despite his chronically low HRs. Chart review shows HRs into the 40s as far back as 2017. Currently he denies chest pain, SOB, lightheadedness, or near syncope.   Past Medical History:  Diagnosis Date  . Anemia   . Arthritis    "back, neck" (07/28/2014)  . Blind    both eyes removed   . Bruises easily   . CAD, NATIVE VESSEL 01/08/2008      .  Cellulitis late 1980's   "hospitalized; wrapped both legs; several times; no OR for this"  . Colon polyps   . Diabetic neuropathy ([place])   . Dialysis patient ([place])   . DM type 2 (diabetes mellitus, type 2) ([place])    no medications (07/28/2014) diet and excersie controlled not onmeds for 14-15 years   . Dysrhythmia    afib   . ESRD (end stage renal disease) on dialysis [facility])    [name]; Mon, Wed, Fri (07/28/2014)  . Fall    hx of fall and required left hip surgery   . Family history of breast cancer    mother  . Heart murmur    hx of  . History of blood product transfusion   . HYPERLIPIDEMIA-MIXED 01/08/2008  . HYPERTENSION 01/27/2009   BP low , hx of HTN, Currently on meds to raise BP  . Hypotension   . Kidney stones   . Low blood pressure   . OVERWEIGHT/OBESITY 01/08/2008   Lost 205 lbs through diet and exercise.    . Peripheral vascular disease ([place])    vein stripping  . Pneumonia 2000;s X 1  . Prostate cancer ([place])   . Prosthetic eye globe    both eyes  . Renal cell carcinoma 2001 and 2003   "both kidneys"  . Renal insufficiency      Surgical History:  Past Surgical History:  Procedure Laterality Date  . AV FISTULA PLACEMENT  Left 02/2010   LFA  . AV FISTULA PLACEMENT Left 01/12/2016   Procedure: LEFT BRACHIOCEPHALIC ARTERIOVENOUS (AV) FISTULA CREATION;  Surgeon: Angelia Mould, MD;  Location: [place];  Service: Vascular;  Laterality: Left;  . CHOLECYSTECTOMY OPEN  1992  . COLONOSCOPY  06/14/2011   Procedure: COLONOSCOPY;  Surgeon: [name], MD;  Location: [facility];  Service: Endoscopy;  Laterality: N/A;  . COLONOSCOPY N/A 07/24/2012   Procedure: COLONOSCOPY;  Surgeon: [name], MD;  [facility];  Service: Endoscopy;  Laterality: N/A;  . COLONOSCOPY    . COLONOSCOPY N/A 11/04/2015   Procedure: COLONOSCOPY;  Surgeon: Manus Gunning, MD;  Location: [facility] ENDOSCOPY;  Service: Gastroenterology;  Laterality: N/A;  . COLONOSCOPY WITH  PROPOFOL N/A 05/13/2014   Procedure: COLONOSCOPY WITH PROPOFOL;  Surgeon: [name], MD;  [facility];  Service: Endoscopy;  Laterality: N/A;  . CORONARY ARTERY BYPASS GRAFT  09/29/2011   Procedure: CORONARY ARTERY BYPASS GRAFTING (CABG);  Surgeon: [name], MD;  Location: [place];  Service: Open Heart Surgery;  Laterality: N/A;  Coronary Artery Bypass Graft times two utilizing the left internal mammary artery and the right greater saphenous vein harvested endoscopically.  . CYSTOSCOPY WITH RETROGRADE PYELOGRAM, URETEROSCOPY AND STENT PLACEMENT Left 07/28/2014   Procedure: CYSTOSCOPY WITH RETROGRADE PYELOGRAM, URETERAL BALLOON DILITATION, URETEROSCOPY AND LEFT STENT PLACEMENT;  Surgeon: [name], MD;  [facility];  Service: Urology;  Laterality: Left;  . ENUCLEATION  2003; 2006   bilateral; "diabetes; pain"  . FOOT AMPUTATION Right ~ 2002   right;  partial; "infection"  . FRACTURE SURGERY     hip fx  . HIP PINNING,CANNULATED Left 12/31/2013   Procedure: CANNULATED HIP PINNING;  Surgeon: Renette Butters, MD;  Location: [place];  Service: Orthopedics;  Laterality: Left;  Carm, FX Table, [employer]  . HOT HEMOSTASIS  06/14/2011   Procedure: HOT HEMOSTASIS (ARGON PLASMA COAGULATION/BICAP);  Surgeon: [name], MD;  Location[facility];  Service: Endoscopy;  Laterality: N/A;  . INSERTION OF DIALYSIS CATHETER Right 01/12/2016   Procedure: INSERTION OF DIALYSIS CATHETER;  Surgeon: Angelia Mould, MD;  Location: [place];  Service: Vascular;  Laterality: Right;  . INSERTION PROSTATE RADIATION SEED  03/2012  . IR GENERIC HISTORICAL  01/13/2016   IR RADIOLOGIST EVAL & MGMT 01/13/2016 Aletta Edouard, MD [facility] INTERV RAD  . IR RADIOLOGIST EVAL & MGMT  03/02/2017  . IR RADIOLOGIST EVAL & MGMT  05/18/2017  . IR RADIOLOGIST EVAL & MGMT  07/27/2017  . LAPAROTOMY N/A 04/07/2016   Procedure: EXPLORATORY LAPAROTOMY AND RESECTION OF RETROPERITONEAL MASS;  Surgeon: Raynelle Bring, MD;   [facility];  Service: Urology;  Laterality: N/A;  . LEFT HEART CATHETERIZATION WITH CORONARY ANGIOGRAM N/A 09/27/2011   Procedure: LEFT HEART CATHETERIZATION WITH CORONARY ANGIOGRAM;  Surgeon: [name], MD;  Location: [facility] CATH LAB;  Service: Cardiovascular;  Laterality: N/A;  . LIGATION OF ARTERIOVENOUS  FISTULA Left 01/12/2016   Procedure: LIGATION OF LEFT RADIOCEPHALIC ARTERIOVENOUS  FISTULA;  Surgeon: Angelia Mould, MD;  Location: [place];  Service: Vascular;  Laterality: Left;  . LIGATION OF COMPETING BRANCHES OF ARTERIOVENOUS FISTULA Left 07/29/2014   Procedure: LIGATION OF COMPETING BRANCHES OF LEFT ARM ARTERIOVENOUS FISTULA;  Surgeon: [name], MD;  Location: [place];  Service: Vascular;  Laterality: Left;  . NEPHRECTOMY Right 01/30/2015   done at [place]  . PARTIAL COLECTOMY Right 04/07/2016   Procedure: RIGHT COLECTOMY AND PARTIAL LIVER  RESECTION;  Surgeon: Raynelle Bring, MD;  [facility];  Service: Urology;  Laterality: Right;  . RADIOFREQUENCY ABLATION N/A 04/19/2017   Procedure: CT MICROWAVE THERMAL ABLATION;  Surgeon: Aletta Edouard, MD;  [facility];  Service: Anesthesiology;  Laterality: N/A;  . RADIOFREQUENCY ABLATION KIDNEY  2010-2012   "twice; one on each side; for cancer"  . REVISON OF ARTERIOVENOUS FISTULA Left 09/01/2015   Procedure: EXPLORATION LEFT LOWER ARM  RADIOCEPHALIC ARTERIOVENOUS FISTULA;  Surgeon: Conrad Lagro, MD;  Location: [place];  Service: Vascular;  Laterality: Left;  [name] VARICOSE VEIN SURGERY  mid 1980's    BLE; "knees down; both legs; 2 separate times"     Medications Prior to Admission  Medication Sig Dispense Refill Last Dose  . aspirin EC 81 MG tablet Take 81 mg by mouth at bedtime.   03/13/2019 at 8pm  . atorvastatin (LIPITOR) 10 MG tablet Take 10 mg by mouth daily.   03/13/2019 at 8pm  . benzonatate (TESSALON) 100 MG capsule Take 100 mg by mouth every 8 (eight) hours as needed for cough.   unknown  . cyclobenzaprine  (FLEXERIL) 10 MG tablet Take 10 mg by mouth every 12 (twelve) hours as needed. Neck pain   03/13/2019 at 1900  . ferrous sulfate 325 (65 FE) MG tablet Take 325 mg by mouth See admin instructions. 5am on Monday, Wednesday, Friday 8am on Sunday, Tuesday, Thursday, Saturday   03/13/2019 at 5am  . JANUVIA 25 MG tablet Take 25 mg by mouth See admin instructions. 8am on Sunday, Tuesday, Thursday, Saturday 5am on [place], Wednesday, Friday   03/13/2019 at 5am  . midodrine (PROAMATINE) 10 MG tablet Take 1 tablet (10 mg total) by mouth 3 (three) times daily with meals. (Patient taking differently: Take 10 mg by mouth See admin instructions. 10mg  Three times day, Three times a week. Tuesday, Thursday, Saturday) 90 tablet 0 03/12/2019 at 8pm  . multivitamin (RENA-VIT) TABS tablet Take 1 tablet by mouth daily.   03/13/2019 at 8pm  . naproxen sodium (ALEVE) 220 MG tablet Take 220 mg by mouth every 12 (twelve) hours as needed for pain.   03/12/2019 at 1100  . Probiotic Product (ALIGN) 4 MG CAPS Take 1 capsule by mouth See admin instructions. Take at 8am on Sunday, Tuesday, Thursday, Saturday Take at 5am on Monday, Wednesday, Friday   03/13/2019 at 5am  . sevelamer carbonate (RENVELA) 800 MG tablet Take 2,400-4,800 mg by mouth See admin instructions. Take 6 tablets at 8am (4800 mg) by mouth 3 times daily with meals on Tuesday, Thursday, Saturday, Sunday Take 6 tablets at 5am (4800 mg) by mouth 3 times daily with meals on Monday, Wednesday, Friday  Take 3 tablets (2400 mg) 2 times daily with snacks   03/13/2019 at 5pm  . pantoprazole (PROTONIX) 40 MG tablet Take 1 tablet (40 mg total) by mouth daily before breakfast. (Patient not taking: Reported on 05/29/2018) 30 tablet 3 Not Taking at Unknown time    Inpatient Medications:  . albuterol  2 puff Inhalation Q6H  . atorvastatin  10 mg Oral Daily  . Chlorhexidine Gluconate Cloth  6 each Topical Q0600  . Chlorhexidine Gluconate Cloth  6 each Topical Q0600  . dexamethasone  (DECADRON) injection  6 mg Intravenous Q24H  . mouth rinse  15 mL Mouth Rinse BID  . midodrine  10 mg Oral TID WC  . multivitamin  1 tablet Oral Daily  . sevelamer carbonate  2,400 mg Oral With snacks  . sevelamer carbonate  4,800 mg Oral TID WC  . warfarin  7.5 mg Oral ONCE-1800  . Warfarin - Pharmacist Dosing Inpatient   Does not apply q1800    Allergies:  Allergies  Allergen Reactions  . Codeine Other (See Comments)     [place]  . Tape Other (See Comments)    Plastic tape tears skin off, please use paper tape instead.    Social History   Socioeconomic History  . Marital status: Widowed    Spouse name: Not on file  . Number of children: Not on file  . Years of education: Not on file  . Highest education level: Not on file  Occupational History  . Not on file  Tobacco Use  . Smoking status: Never Smoker  . Smokeless tobacco: Never Used  Substance and Sexual Activity  . Alcohol use: No    Alcohol/week: 0.0 standard drinks  . Drug use: No  . Sexual activity: Never  Other Topics Concern  . Not on file  Social History Narrative  . Not on file   Social Determinants of Health   Financial Resource Strain:   . Difficulty of Paying Living Expenses: Not on file  Food Insecurity:   . Worried About Charity fundraiser in the Last Year: Not on file  . Ran Out of Food in the Last Year: Not on file  Transportation Needs: No Transportation Needs  . Lack of Transportation (Medical): No  . Lack of Transportation (Non-Medical): No  Physical Activity:   . Days of Exercise per Week: Not on file  . Minutes of Exercise per Session: Not on file  Stress:   . Feeling of Stress : Not on file  Social Connections:   . Frequency of Communication with Friends and Family: Not on file  . Frequency of Social Gatherings with Friends and Family: Not on file  . Attends Religious Services: Not on file  . Active Member of Clubs or Organizations: Not on file  . Attends English as a second language teacher Meetings: Not on file  . Marital Status: Not on file  Intimate Partner Violence:   . Fear of Current or Ex-Partner: Not on file  . Emotionally Abused: Not on file  . Physically Abused: Not on file  . Sexually Abused: Not on file     Family History  Problem Relation Age of Onset  . Cancer Mother 10       breast, spine and ovarian  . Heart disease Mother   . Hyperlipidemia Mother   . Hypertension Mother   . Varicose Veins Mother   . Emphysema Father   . Cancer Father 34       kidney, prostate  . Heart disease Father   . Hyperlipidemia Father   . Hypertension Father   . Cancer Sister        ovarian  . Cancer Brother        lung  . Heart disease Brother   . Hyperlipidemia Brother   . Hypertension Brother   . Heart attack Brother   . Peripheral vascular disease Brother   . Cancer Other   . Coronary artery disease Other   . Kidney disease Other   . Breast cancer Other   . Ovarian cancer Other   . Lung cancer Other   . Diabetes Other   . Malignant hyperthermia Neg Hx      Review of Systems: All other systems reviewed and are otherwise negative except as noted above.  Physical Exam:  Vitals:   03/18/19 0604 03/18/19 0605 03/18/19 0606 03/18/19 0737  BP:    (!) 113/40  Pulse: (!) 53 (!) 53 (!) 53 (!) 34  Resp: 13 14 16 14   Temp:    98.1 F (36.7 C)  TempSrc:    Oral  SpO2: 100% 100% 100% 100%  Weight:      Height:       Due to patient having active COVID infection, a virtual visit was thought to be the most appropriate way to consult on this patient. Telemetry and past medical records were personally and extensively reviewed.    Labs:   Lab Results  Component Value Date   WBC 3.6 (L) 03/18/2019   HGB 9.4 (L) 03/18/2019   HCT 28.0 (L) 03/18/2019   MCV 106.5 (H) 03/18/2019   PLT 114 (L) 03/18/2019    Recent Labs  Lab 03/17/19 0408 03/18/19 0458  NA 145 143  K 4.3 4.3  CL 101 100  CO2 22 21*  BUN 90* 116*  CREATININE 10.23* 11.35*    CALCIUM 8.3* 8.6*  PROT 5.5*  --   BILITOT 0.9  --   ALKPHOS 93  --   ALT 17  --   AST 20  --   GLUCOSE 192* 260*   Radiology/Studies: DG Chest Port 1 View  Result Date: 03/14/2019 CLINICAL DATA:  Covid positive EXAM: PORTABLE CHEST 1 VIEW COMPARISON:  March 20, 2018 FINDINGS: There is mild cardiomegaly. Overlying median sternotomy wires. Aortic knob calcifications. Mildly increased hazy airspace opacity seen at the right lung base. The left lung is clear. No acute osseous abnormality. IMPRESSION: Mildly increased hazy airspace opacity at the right lung base which is non-specific could be due to atelectasis and/or early infectious etiology. Electronically Signed   By: Prudencio Pair M.D.   On: 03/14/2019 08:58   EKG: 03/17/19 shows AF with slow ventricular rate at 54 bpm (personally reviewed) (EKG from 01/2017 shows sinus brady at 58 bpm with PR interval of 262 ms)  TELEMETRY:  Atrial fibrillation underyling, with slow ventricular rates in the 40s mostly, up to 17s. Occasional drops into the 30s, and with occasional pause has been calculated as low as 29 bpm (personally reviewed)  Assessment/Plan: 1.  Chronic bradycardia with intermittent AV block Previously "turned down" for pacemaker implantation due to significant co-morbidities and minimal symptoms.  Note from Dr. Percival Spanish 11/2016 reviewed a holter monitor with episode HR as low as 29 with episodic 2:1 block and Sinus brady. Thought even at that time to be too high risk for PPM due to co-morbidities and specific concern for risk on infection.  I discussed personally with patient, and apart from his worsened fatigue and weakness, he feels about the same as usual.  He specifically denies lightheadedness, near syncope, or syncope during, or prior to this admission. He would be at a very high risk for infection, and is currently COVID +.  Will discuss further with MD. Not candidate for any procedure at this time.   2. NSVT It was  reported to me that this patient had > 30 beat run of NSVT, though I do not see this on tele.  Not candidate for amio or BB. Electrolytes stable.  Echo 03/2018 LVEF 50-55%.   3. COVID infection Regardless of candidacy, would avoid any procedures in his COVID infectious window unless markedly symptomatic.   4. ESRD on HD T/Th/S HD As above, overall poor candidate for EP procedures.   5. Goals of Care Note  pt has been refusing several recommendations and at times, meals.  It would be appropriate to visit Goals of Care, and possible involve palliative care team.   For questions or updates, please contact [place] Please consult [email or web address] for contact info under Cardiology/STEMI.  Jacalyn Lefevre, PA-C  03/18/2019 10:38 AM  EP Attending  Patient seen and chart reviewed. The patient has been admitted with a COVID infection and been found to have asymptomatic bradycardia with pauses of 4 seconds. It is not clear as to the extent of his symptoms but he was at bed rest and did not have syncope. He has multiple medical problems including ESRD on HD. He has been admitted with COVID infection but on oxygen his sats have been stable. I have reviewed the treatment options which are limited. He will avoid any AV nodal blocking drugs. His ESRD makes him a poor candidate for PPM insertion. I will review with Dr. Serita Grammes as a leadless PM would be his only option.  Mikle Bosworth.D.

## 2019-03-18 NOTE — Progress Notes (Addendum)
[facility] Progress Note  Subjective:  Seen and examined today with appropriate PPE.  He has been on 3 L of oxygen.  Has dialysis today.  Last treatment recorded as 12/11 with 1.2 kg UF.  Missing several doses of binders and charted as refusing.  Note cardiology was contacted about bradycardia per charting and felt no indication for pacing.  Review of systems denies nausea, vomiting, chest pain or shortness of breath   Vitals:   03/18/19 0604 03/18/19 0605 03/18/19 0606 03/18/19 0737  BP:    (!) 113/40  Pulse: (!) 53 (!) 53 (!) 53 (!) 34  Resp: 13 14 16 14   Temp:    98.1 F (36.7 C)  TempSrc:    Oral  SpO2: 100% 100% 100% 100%  Weight:      Height:        Inpatient medications: . albuterol  2 puff Inhalation Q6H  . atorvastatin  10 mg Oral Daily  . Chlorhexidine Gluconate Cloth  6 each Topical Q0600  . Chlorhexidine Gluconate Cloth  6 each Topical Q0600  . dexamethasone (DECADRON) injection  6 mg Intravenous Q24H  . mouth rinse  15 mL Mouth Rinse BID  . midodrine  10 mg Oral TID WC  . multivitamin  1 tablet Oral Daily  . sevelamer carbonate  2,400 mg Oral With snacks  . sevelamer carbonate  4,800 mg Oral TID WC  . warfarin  7.5 mg Oral ONCE-1800  . Warfarin - Pharmacist Dosing Inpatient   Does not apply q1800   . sodium chloride 1,000 mL (03/14/19 0851)  . cefTRIAXone (ROCEPHIN)  IV 1 g (03/18/19 0956)   sodium chloride, acetaminophen, cyclobenzaprine, ondansetron **OR** ondansetron (ZOFRAN) IV    Exam: Gen alert, not in distress No jvd or bruits Chest clear bilat tp bases RRR no MRG Abd soft ntnd no mass or ascites GU normal mal Ext no LE edema Neuro awake on entry and answers questions Left upper extremity AVF +bruit    Home meds:  - midodrine 10 tid/ sevelamer carb ac tid  - lipitor 10/  aspirin 81  - januvia 25 mg qd  - prn's/ vitamins/ supplements  - pantoprazole 40    Outpt HD: M[facility]  4h   450/800  98kg  2/2 bath  AVF  Hep  none  - parsabiv 7.5mg  tiw   - low UF 0.2, 0.8 recently    CXR 12/10 - no edema, RLL infiltrate   Assessment/ Plan: 1. COVID +PNA: admit 12/10, not hypoxic, CXR +infiltrate, low wbc, no fever.  Admitted 03/14/19 and started on IV abx , remdesivir and decadron per primary team.    2. Chronic hypotension - BP's runs 70's -90's at OP HD, midodrine 10 tid -> dec to 5 mg TID with bradycardia  3. Volume - no edema on exam, CXR no edema 12/10. UF 1.2 L on Friday's HD.  4. ESRD - on HD per Monday Wednesday Friday schedule.  Lengthen treatment from 3.5 to 4 hours for clearance 5. Bradycardia - note cardiology was contacted.  Not on AV nodal agents; reduce midodrine to 5 mg TID for now as tolerated 6. DM2 per primary team  7. Anemia ckd -improving; Hb 9.4; no ESA in outpatient orders 8. MBD ckd - continue binders; HD  Today; renal diet  9. EOL - multiple comorbidities, pt is now DNR.      Rob Schertz 03/18/2019, 10:34 AM

## 2019-03-19 LAB — CBC WITH DIFFERENTIAL/PLATELET
Abs Immature Granulocytes: 0.11 10*3/uL — ABNORMAL HIGH (ref 0.00–0.07)
Basophils Absolute: 0 10*3/uL (ref 0.0–0.1)
Basophils Relative: 0 %
Eosinophils Absolute: 0 10*3/uL (ref 0.0–0.5)
Eosinophils Relative: 0 %
HCT: 29.5 % — ABNORMAL LOW (ref 39.0–52.0)
Hemoglobin: 10 g/dL — ABNORMAL LOW (ref 13.0–17.0)
Immature Granulocytes: 2 %
Lymphocytes Relative: 2 %
Lymphs Abs: 0.1 10*3/uL — ABNORMAL LOW (ref 0.7–4.0)
MCH: 35.6 pg — ABNORMAL HIGH (ref 26.0–34.0)
MCHC: 33.9 g/dL (ref 30.0–36.0)
MCV: 105 fL — ABNORMAL HIGH (ref 80.0–100.0)
Monocytes Absolute: 0.3 10*3/uL (ref 0.1–1.0)
Monocytes Relative: 5 %
Neutro Abs: 4.9 10*3/uL (ref 1.7–7.7)
Neutrophils Relative %: 91 %
Platelets: 129 10*3/uL — ABNORMAL LOW (ref 150–400)
RBC: 2.81 MIL/uL — ABNORMAL LOW (ref 4.22–5.81)
RDW: 14.3 % (ref 11.5–15.5)
WBC: 5.4 10*3/uL (ref 4.0–10.5)
nRBC: 0 % (ref 0.0–0.2)

## 2019-03-19 LAB — C-REACTIVE PROTEIN: CRP: 10.3 mg/dL — ABNORMAL HIGH (ref <1.0)

## 2019-03-19 LAB — RENAL FUNCTION PANEL
Albumin: 2.7 g/dL — ABNORMAL LOW (ref 3.5–5.0)
Anion gap: 20 — ABNORMAL HIGH (ref 5–15)
BUN: 94 mg/dL — ABNORMAL HIGH (ref 8–23)
CO2: 22 mmol/L (ref 22–32)
Calcium: 8.8 mg/dL — ABNORMAL LOW (ref 8.9–10.3)
Chloride: 100 mmol/L (ref 98–111)
Creatinine, Ser: 9.15 mg/dL — ABNORMAL HIGH (ref 0.61–1.24)
GFR calc Af Amer: 6 mL/min — ABNORMAL LOW (ref >60)
GFR calc non Af Amer: 5 mL/min — ABNORMAL LOW (ref >60)
Glucose, Bld: 213 mg/dL — ABNORMAL HIGH (ref 70–99)
Phosphorus: 7 mg/dL — ABNORMAL HIGH (ref 2.5–4.6)
Potassium: 3.7 mmol/L (ref 3.5–5.1)
Sodium: 142 mmol/L (ref 135–145)

## 2019-03-19 LAB — FOLATE RBC
Folate, Hemolysate: >620 ng/mL
Folate, RBC: >2238 ng/mL (ref >498)
Hematocrit: 27.7 % — ABNORMAL LOW (ref 37.5–51.0)

## 2019-03-19 LAB — CULTURE, BLOOD (ROUTINE X 2)
Culture: NO GROWTH
Culture: NO GROWTH
Special Requests: ADEQUATE
Special Requests: ADEQUATE

## 2019-03-19 LAB — PROTIME-INR
INR: 2.8 — ABNORMAL HIGH (ref 0.8–1.2)
INR: 3.2 — ABNORMAL HIGH (ref 0.8–1.2)
Prothrombin Time: 29.6 seconds — ABNORMAL HIGH (ref 11.4–15.2)
Prothrombin Time: 32.7 seconds — ABNORMAL HIGH (ref 11.4–15.2)

## 2019-03-19 LAB — FERRITIN: Ferritin: 2885 ng/mL — ABNORMAL HIGH (ref 24–336)

## 2019-03-19 LAB — D-DIMER, QUANTITATIVE: D-Dimer, Quant: 2.98 ug/mL-FEU — ABNORMAL HIGH (ref 0.00–0.50)

## 2019-03-19 LAB — GLUCOSE, CAPILLARY: Glucose-Capillary: 232 mg/dL — ABNORMAL HIGH (ref 70–99)

## 2019-03-19 MED ORDER — SEVELAMER CARBONATE 800 MG PO TABS
2400.0000 mg | ORAL_TABLET | Freq: Three times a day (TID) | ORAL | Status: DC | PRN
  Filled 2019-03-19: qty 3

## 2019-03-19 MED ORDER — RENA-VITE PO TABS
1.0000 | ORAL_TABLET | Freq: Every day | ORAL | Status: DC
  Administered 2019-03-19 – 2019-03-24 (×6): 1 via ORAL
  Filled 2019-03-19 (×9): qty 1

## 2019-03-19 MED ORDER — DARBEPOETIN ALFA 40 MCG/0.4ML IJ SOSY
40.0000 ug | PREFILLED_SYRINGE | Freq: Once | INTRAMUSCULAR | Status: DC
  Filled 2019-03-19: qty 0.4

## 2019-03-19 MED ORDER — DARBEPOETIN ALFA 40 MCG/0.4ML IJ SOSY
40.0000 ug | PREFILLED_SYRINGE | Freq: Once | INTRAMUSCULAR | Status: AC
  Administered 2019-03-19: 40 ug via SUBCUTANEOUS
  Filled 2019-03-19: qty 0.4

## 2019-03-19 MED ORDER — CHLORHEXIDINE GLUCONATE CLOTH 2 % EX PADS
6.0000 | MEDICATED_PAD | Freq: Every day | CUTANEOUS | Status: DC
  Administered 2019-03-21 – 2019-03-23 (×3): 6 via TOPICAL

## 2019-03-19 NOTE — Progress Notes (Signed)
[facility] Progress Note  Subjective:   Had dialysis overnight/early AM this morning with 1.5 kg UF; HD had been started earlier and was rinsed back after neighboring pt had acute issue. Exact total duration unclear to me - appears to 3.5. Discussed with nursing.  Not examined in person on 12/15 due to covid with isolation and in keeping with efforts to prevent the spread of infection and to conserve personal protective equipment.  Charted as being weaned to room air but they are restarting.  Previously 100% on 3 liters oxygen.  Missing several doses of binders and charted as refusing.  Continues to be bradycardic and cardiology has consulted.   Review of systems per nursing No shortness of breath  Not eating much; no lunch or dinner No n/v Uncomfortable - not sleeping well    Vitals:   03/19/19 0400 03/19/19 0430 03/19/19 0500 03/19/19 0530  BP: 105/62 124/73 118/65 (!) 121/101  Pulse: (!) 55 (!) 53 (!) 52 (!) 53  Resp: (!) 21 20 19 18   Temp:    97.8 F (36.6 C)  TempSrc:    Oral  SpO2:    92%  Weight:      Height:        Inpatient medications: . albuterol  2 puff Inhalation Q6H  . atorvastatin  10 mg Oral Daily  . Chlorhexidine Gluconate Cloth  6 each Topical Q0600  . Chlorhexidine Gluconate Cloth  6 each Topical Q0600  . dexamethasone (DECADRON) injection  6 mg Intravenous Q24H  . mouth rinse  15 mL Mouth Rinse BID  . midodrine  5 mg Oral TID WC  . multivitamin  1 tablet Oral Daily  . sevelamer carbonate  2,400 mg Oral With snacks  . sevelamer carbonate  4,800 mg Oral TID WC  . Warfarin - Pharmacist Dosing Inpatient   Does not apply q1800   . sodium chloride 1,000 mL (03/14/19 0851)   sodium chloride, acetaminophen, cyclobenzaprine, ondansetron **OR** ondansetron (ZOFRAN) IV    Exam:  Last examined in person on 12/14 with appropriate PPE Due to the nature of this patient's [id number]-06 with isolation and in keeping with efforts to prevent the spread of  infection and to conserve personal protective equipment, a physical exam was not personally performed.  Patient's symptoms and exam were discussed in detail with the RN.  A chart review of other providers notes and the patient's lab work as well as review of other pertinent studies was performed.  Exam details from prior documentation were reviewed specifically and confirmed with the bedside nurse.  Location of service: [place] adult male in bed in no acute distress HEENT NCAT  pulm On room air - 89% and about to placed on oxygen again; Unlabored lungs are clear and diminished  Cardiac Bradycardic  Ext - no LE edema  Neuro conversant but somewhat confused per nursing LUE AVF bruit and thrill    Home meds:  - midodrine 10 tid/ sevelamer carb ac tid  - lipitor 10/  aspirin 81  - januvia 25 mg qd  - prn's/ vitamins/ supplements  - pantoprazole 40    Outpt HD: M[facility]  4h   450/800  98kg  2/2 bath  AVF  Hep none  - parsabiv 7.5mg  tiw   - low UF 0.2, 0.8 recently    CXR 12/10 - no edema, RLL infiltrate   Assessment/ Plan: 1. COVID +PNA: admit 12/10, not hypoxic, CXR +infiltrate, low wbc, no fever.  Admitted 03/14/19.  abx and decadron per primary team.  s/p remdesivir  2. Chronic hypotension - BP's runs 70's -90's at OP HD, midodrine 10 tid -> was decreased to 5 mg TID with bradycardia 3. Volume - no edema on exam, CXR no edema 12/10  4. ESRD - on HD per Monday Wednesday Friday schedule.  Lengthen treatment to 4 hours as inpatient volumes allow for clearance 5. Bradycardia - note cardiology was consulted.  Not on AV nodal agents; have reduced midodrine to 5 mg TID for now as tolerated; nurse reports pt has refused pacemaker in past  6. DM2 per primary team  7. Anemia ckd -improving; no ESA in outpatient orders; aranesp 40 mcg once today 8. MBD ckd - continue binders (not compliant); renal diet and dialysis 9. EOL - multiple comorbidities, pt is now DNR.     [name] 03/19/2019 7:59 AM

## 2019-03-19 NOTE — Progress Notes (Signed)
[name]  PROGRESS NOTE    Helmer Dull Spilde  WGN:[id number] DOB: [birth year] DOA: 03/13/2019 PCP: [name], MD   Brief Narrative:   Mikel Hardgrove Scearceis a 68 y.o.malewith medical history significant ofESRD on dialysis TTS,B/L eyeblindness, chronic hypotension on daily midodrine,was sent to the EDfrom [facility] due to generalized weakness, denies any URT symptoms, Denies cough or SOB.Only c/o ofchronic neck, back and leg pains. Patient fell PTA with only complaints of a skin tear to his right forearm, also has sacral decubitus ulcer which has been hurting him. Noted to be hypotensive in ED with SBPs in the 70s and 80s, but patient reports thatits hisbaseline.Mentation intact,not tachycardic. Patient admitted for further management, awaiting transfer to [facility] for dialysis. Nephrology consulted.  03/19/19: INR up today. Holding tonight's dose. PT/OT consults ordered. Doing well. Completed remdes. Continue steroids.   Assessment & Plan:   Principal Problem:   COVID-19 virus infection Active Problems:   ESRD (end stage renal disease) on dialysis (HCC)   Hypotension of hemodialysis   Thrombocytopenia (HCC)   DM type 2 (diabetes mellitus, type 2) (HCC)   Bilateral blindness   Generalized weakness  Pneumonia due to COVID-19 virus - CXR: hazy airspace opacity at the right lung base - titrate O2 support to SpO2 92% - remdesivir(through 03/19/19),decadron, rocephin (ends today) - PRN ventolin inhalor - COVID 19+ - Bld Cx NTD -03/19/19: inflammatory markers are trending down. He is stable on RA. Remdes is complete. Continuing steroids.  Chronic hypotension - reports SBP runs between 70s 80s - No tachycardia noted, mentation normal, lactic acid 0.9 - continue home midodrine     - he is stable  ESRD - per nephrology, appreciate assistance  Hypokalemia -resolved, monitor  Anemia of chronic  disease Macrocytosis Thrombocytopenia - no evidence of frank bleed, monitor - B12: 2879, THF pending - Hgb is stable, monitor  HLD - lipitor  Bradycardia Hx of afib (slow) - last eval'd by cards 02/15/18: rec'd AC at the time, but pt declined answer and did not follow up - Has agreed to coumadin; pharm consult for coumadin - Notified of 4 - 5 second pauses on tele - spoke with cards; they will review - rpt EKG w/ sinus brady and 1st degree block; Qtc 430 - he's not on any AV nodal blocking agents, but is on midodrine for chronic hypotension - lytes look ok, but he is a dialysis patient - he denies any dizziness, weakness; mentation is appropriate     - 03/18/19: cards and EP have seen; not a candidate for amiodarone, BB and poor candidate for EP procedure; has previously turned down ppm d/t risks     - continue to monitor   Supratherapeutic INR     - coumadin started for a fib     - INR is 3.8 this morning; holding tonight's dose     - dosing per pharmacy     - Hgb stable, no frank bleed noted, monitor  Debility     - PT/OT consults ordered  DVT prophylaxis: coumadin Code Status: DNR   Disposition Plan: TBD  Consultants:   Nephrology  Cardiology/EP   ROS:  Denies dyspnea, CP, N, V . Remainder 10-pt ROS is negative for all not previously mentioned.  Subjective: "Oh, will it be soon?"  Objective: Vitals:   03/19/19 0530 03/19/19 0800 03/19/19 1025 03/19/19 1043  BP: (!) 121/101 121/65    Pulse: (!) 53 (!) 54 (!) 54 (!) 56  Resp: 18  16 13 15   Temp: 97.8 F (36.6 C)     TempSrc: Oral     SpO2: 92% 95% 90% 95%  Weight:      Height:        Intake/Output Summary (Last 24 hours) at 03/19/2019 1315 Last data filed at 03/19/2019 1015 Gross per 24 hour  Intake 177.49 ml  Output 1498 ml  Net -1320.51 ml   Filed Weights   03/15/19 0346 03/16/19 0025 03/16/19 0330  Weight: 113.4 kg 111 kg 109.8 kg     Examination:  General: 68 y.o. male resting in bed in NAD Cardiovascular: Brady, +S1, S2, no m/g/r Respiratory: CTABL, no w/r/r, normal WOB GI: BS+, NDNT, soft MSK: No e/c/c Neuro: alert to name, follows commands, blind Psyc: Appropriate interaction and affect, calm/cooperative   Data Reviewed: I have personally reviewed following labs and imaging studies.  CBC: Recent Labs  Lab 03/15/19 1443 03/16/19 0500 03/17/19 0408 03/18/19 0458 03/19/19 0500  WBC 2.0* 2.0* 1.2* 3.6* 5.4  NEUTROABS 1.4* 1.4* 1.0* 3.1 4.9  HGB 8.5* 8.9* 9.1* 9.4* 10.0*  HCT 25.7* 26.9* 27.9* 28.0* 29.5*  MCV 108.9* 108.9* 107.7* 106.5* 105.0*  PLT 83* 89* 88* 114* 097*   Basic Metabolic Panel: Recent Labs  Lab 03/15/19 1443 03/16/19 0500 03/16/19 1235 03/17/19 0408 03/17/19 1114 03/18/19 0458 03/19/19 0500  NA 143 144 143 145  --  143 142  K 3.4* 3.8 3.6 4.3  --  4.3 3.7  CL 103 101 103 101  --  100 100  CO2 25 25 24 22   --  21* 22  GLUCOSE 86 105* 152* 192*  --  260* 213*  BUN 68* 72* 76* 90*  --  116* 94*  CREATININE 9.09* 9.09* 9.52* 10.23*  --  11.35* 9.15*  CALCIUM 7.5* 8.3* 8.0* 8.3*  --  8.6* 8.8*  MG  --  2.2  --   --  2.4 2.5*  --   PHOS 7.2* 6.9*  --  8.7*  --  9.7* 7.0*   GFR: Estimated Creatinine Clearance: 10.3 mL/min (A) (by C-G formula based on SCr of 9.15 mg/dL (H)). Liver Function Tests: Recent Labs  Lab 03/14/19 0700 03/14/19 0857 03/16/19 0500 03/16/19 1235 03/17/19 0408 03/18/19 0458 03/19/19 0500  AST  --  23  --  21 20  --   --   ALT 15 16  --  18 17  --   --   ALKPHOS  --  76  --  80 93  --   --   BILITOT  --  0.9  --  1.2 0.9  --   --   PROT  --  5.5*  --  5.0* 5.5*  --   --   ALBUMIN  --  2.8* 2.3* 2.3* 2.5* 2.5* 2.7*   No results for input(s): LIPASE, AMYLASE in the last 168 hours. No results for input(s): AMMONIA in the last 168 hours. Coagulation Profile: Recent Labs  Lab 03/17/19 1149 03/18/19 0458 03/19/19 0500 03/19/19 0955  INR 1.2  1.4* 2.8* 3.2*   Cardiac Enzymes: No results for input(s): CKTOTAL, CKMB, CKMBINDEX, TROPONINI in the last 168 hours. BNP (last 3 results) No results for input(s): PROBNP in the last 8760 hours. HbA1C: No results for input(s): HGBA1C in the last 72 hours. CBG: Recent Labs  Lab 03/15/19 2337 03/16/19 0022 03/19/19 0209  GLUCAP 66* 165* 232*   Lipid Profile: No results for input(s): CHOL, HDL, LDLCALC, TRIG, CHOLHDL, LDLDIRECT in the  last 72 hours. Thyroid Function Tests: No results for input(s): TSH, T4TOTAL, FREET4, T3FREE, THYROIDAB in the last 72 hours. Anemia Panel: Recent Labs    03/17/19 1208 03/18/19 0458 03/19/19 0500  VITAMINB12 2,879*  --   --   FERRITIN  --  3,223* 2,885*   Sepsis Labs: Recent Labs  Lab 03/14/19 0621 03/14/19 0648  PROCALCITON 2.18  --   LATICACIDVEN  --  0.9    Recent Results (from the past 240 hour(s))  Culture, blood (routine x 2)     Status: None (Preliminary result)   Collection Time: 03/14/19  8:57 AM   Specimen: BLOOD  Result Value Ref Range Status   Specimen Description   Final    BLOOD RIGHT ANTECUBITAL Performed at [facility], [place] [address], [place], [place] [address]    Special Requests   Final    BOTTLES DRAWN AEROBIC AND ANAEROBIC Blood Culture adequate volume Performed at [place] [address] [place], [place] [address]    Culture   Final    NO GROWTH 4 DAYS Performed at [facility], [place] [address], [place], [place] [address]    Report Status PENDING  Incomplete  Culture, blood (routine x 2)     Status: None (Preliminary result)   Collection Time: 03/14/19  8:58 AM   Specimen: BLOOD  Result Value Ref Range Status   Specimen Description   Final    BLOOD BLOOD RIGHT FOREARM Performed at [place] [facility] [address], [place], [place] [address]    Special Requests   Final    BOTTLES DRAWN AEROBIC AND ANAEROBIC Blood Culture adequate  volume Performed at [facility] [address], [place], [place] [address]    Culture   Final    NO GROWTH 4 DAYS Performed at [facility], [place] [address] [place], [place] [address]    Report Status PENDING  Incomplete  MRSA PCR Screening     Status: None   Collection Time: 03/15/19  3:08 AM   Specimen: Nasopharyngeal  Result Value Ref Range Status   MRSA by PCR NEGATIVE NEGATIVE Final    Comment:        The GeneXpert MRSA Assay (FDA approved for NASAL specimens only), is one component of a comprehensive MRSA colonization surveillance program. It is not intended to diagnose MRSA infection nor to guide or monitor treatment for MRSA infections. Performed at [facility], [place] [address], [place], [place] [address]   SARS CORONAVIRUS 2 (TAT 6-24 HRS) Nasopharyngeal Nasopharyngeal Swab     Status: Abnormal   Collection Time: 03/15/19  6:01 AM   Specimen: Nasopharyngeal Swab  Result Value Ref Range Status   SARS Coronavirus 2 POSITIVE (A) NEGATIVE Final    Comment: RESULT CALLED TO, READ BACK BY AND VERIFIED WITH: RN JOHN BRIDGES [phone] FCP (NOTE) SARS-CoV-2 target nucleic acids are DETECTED. The SARS-CoV-2 RNA is generally detectable in upper and lower respiratory specimens during the acute phase of infection. Positive results are indicative of the presence of SARS-CoV-2 RNA. Clinical correlation with patient history and other diagnostic information is  necessary to determine patient infection status. Positive results do not rule out bacterial infection or co-infection with other viruses.  The expected result is Negative. Fact Sheet for Patients: SugarRoll.be Fact Sheet for Healthcare Providers: https://www.woods-mathews.com/ This test is not yet approved or cleared by the [place] FDA and  has been authorized for detection  and/or diagnosis of SARS-CoV-2 by FDA under an Emergency Use  Authorization (EUA). This EUA will remain  in effect (meaning this test can be used) for the dur ation of the COVID-19 declaration under Section 564(b)(1) of the Act, 21 U.S.C. section 360bbb-3(b)(1), unless the authorization is terminated or revoked sooner. Performed at [facility], [place] [address], [place], [place] [address]       Radiology Studies: No results found.   Scheduled Meds: . albuterol  2 puff Inhalation Q6H  . atorvastatin  10 mg Oral Daily  . Chlorhexidine Gluconate Cloth  6 each Topical Q0600  . dexamethasone (DECADRON) injection  6 mg Intravenous Q24H  . mouth rinse  15 mL Mouth Rinse BID  . midodrine  5 mg Oral TID WC  . multivitamin  1 tablet Oral QHS  . sevelamer carbonate  4,800 mg Oral TID WC  . Warfarin - Pharmacist Dosing Inpatient   Does not apply q1800   Continuous Infusions: . sodium chloride 1,000 mL (03/14/19 0851)     LOS: 5 days    Time spent: 25 minutes spent in the coordination of care today.    Jonnie Finner, DO [facility] Pager [phone]  If 7PM-7AM, please contact night-coverage [email or web address] Password [email or web address] 03/19/2019, 1:15 PM

## 2019-03-19 NOTE — Progress Notes (Signed)
Patient completed dialysis and returned to room 5w03. Dialysis RN says 1.5L offloaded.

## 2019-03-19 NOTE — Evaluation (Signed)
Occupational Therapy Evaluation Patient Details Name: [name] MRN: [id number] DOB: [birth year] Today's Date: 03/19/2019    History of Present Illness 68 yo admitted from [facility] with weakness and COVID +. Pt with sacral wound. PMhx: ESRD, bil blindness, hypotension on midodrine, DM, HLD, prostate CA, falls   Clinical Impression   This 68 y/o male presents with the above. PTA pt residing in ALF, reports he receives assistance for ADL completion, uses rollator/wheelchair for mobility tasks. Pt presenting with decreased endurance, weakness, impaired sitting/standing balance. Pt currently requiring +60modA for bed mobility and functional transfers using RW, only able to tolerate approx 2 steps forward/back from EOB during this session. He currently requires up to [place] for LB ADL, minA for seated UB ADL. Pt on RA during session with VSS. He will benefit from continued acute OT services and recommend follow up therapy services in SNF setting (pending level of care ALF able to provide) to maximize his strength, safety and independence with ADL and mobility prior to return to AFL. Will follow.     Follow Up Recommendations  SNF;Supervision/Assistance - 24 hour(pending level of care ALF able to provide)    Equipment Recommendations  Other (comment)(defer to next venue)           Precautions / Restrictions Precautions Precautions: Fall Precaution Comments: blind Restrictions Weight Bearing Restrictions: No      Mobility Bed Mobility Overal bed mobility: Needs Assistance Bed Mobility: Supine to Sit;Sit to Supine     Supine to sit: Mod assist;+2 for physical assistance Sit to supine: Mod assist;+2 for physical assistance   General bed mobility comments: physical assist and increased time to elevate trunk from surface with mod cues and +2 assist to achieve upright. Return to bed pt with assist to lift legs and control trunk. Pt with preference for right sidelying due to  sacral wound  Transfers Overall transfer level: Needs assistance Equipment used: Rolling walker (2 wheeled) Transfers: Sit to/from Stand Sit to Stand: Mod assist;+2 physical assistance         General transfer comment: assist to rise from elevated bed with cues for hand placement x 2 trials with assist for pericare in standing    Balance Overall balance assessment: Needs assistance Sitting-balance support: Feet supported;Bilateral upper extremity supported Sitting balance-Leahy Scale: Fair Sitting balance - Comments: pt with tendency for right lean particularly initially with EOB   Standing balance support: Bilateral upper extremity supported Standing balance-Leahy Scale: Poor Standing balance comment: reliant on bil UE support and assist                           ADL either performed or assessed with clinical judgement   ADL Overall ADL's : Needs assistance/impaired Eating/Feeding: Set up;Sitting   Grooming: Set up;Min guard;Sitting   Upper Body Bathing: Minimal assistance;Sitting   Lower Body Bathing: Maximal assistance;+2 for physical assistance;+2 for safety/equipment;Sit to/from stand   Upper Body Dressing : Minimal assistance;Sitting   Lower Body Dressing: Maximal assistance;Total assistance;+2 for physical assistance;+2 for safety/equipment;Sit to/from stand Lower Body Dressing Details (indicate cue type and reason): required assist for donning shoes; modA+2 for sit<>stand Toilet Transfer: Moderate assistance;+2 for physical assistance;+2 for safety/equipment;Stand-pivot Toilet Transfer Details (indicate cue type and reason): simualated via transfer to/from EOB, pt able to take approx 2 steps forward/back from [place] and Hygiene: Total assistance;+2 for physical assistance;+2 for safety/equipment;Sit to/from stand Toileting - Clothing Manipulation Details (indicate cue type  and reason): assist for pericare in standing with  additional assist for standing  balance     Functional mobility during ADLs: Moderate assistance;+2 for physical assistance;+2 for safety/equipment;Rolling walker General ADL Comments: pt with weakness, decreased sitting/standing balance, poor activity tolerance     Vision   Additional Comments: pt blind at baseline, requires multimodal cues     Perception     Praxis      Pertinent Vitals/Pain Pain Assessment: 0-10 Pain Score: 6  Pain Location: generalized Pain Descriptors / Indicators: Aching Pain Intervention(s): Limited activity within patient's tolerance;Monitored during session;Repositioned     Hand Dominance     Extremity/Trunk Assessment Upper Extremity Assessment Upper Extremity Assessment: Generalized weakness   Lower Extremity Assessment Lower Extremity Assessment: Defer to PT evaluation   Cervical / Trunk Assessment Cervical / Trunk Assessment: Kyphotic   Communication Communication Communication: No difficulties   Cognition Arousal/Alertness: Awake/alert Behavior During Therapy: WFL for tasks assessed/performed Overall Cognitive Status: Impaired/Different from baseline Area of Impairment: Safety/judgement                         Safety/Judgement: Decreased awareness of deficits         General Comments       Exercises     Shoulder Instructions      Home Living Family/patient expects to be discharged to:: Assisted living                             Home Equipment: Walker - 4 wheels;Wheelchair - [employer] - 2 wheels;Shower seat;Grab bars - tub/shower;Grab bars - toilet          Prior Functioning/Environment Level of Independence: Needs assistance  Gait / Transfers Assistance Needed: walks with rollator, WC in community ADL's / Homemaking Assistance Needed: aide for bathing, dressing. Staff does the homemaking   Comments: blind bil eyes removed        OT Problem List: Decreased strength;Decreased range of  motion;Decreased activity tolerance;Impaired balance (sitting and/or standing);Decreased cognition;Decreased safety awareness;Decreased knowledge of use of DME or AE;Decreased knowledge of precautions;Cardiopulmonary status limiting activity      OT Treatment/Interventions: Self-care/ADL training;Therapeutic exercise;Energy conservation;DME and/or AE instruction;Therapeutic activities;Patient/family education;Balance training;Visual/perceptual remediation/compensation;Cognitive remediation/compensation    OT Goals(Current goals can be found in the care plan section) Acute Rehab OT Goals Patient Stated Goal: return to ALF OT Goal Formulation: With patient Time For Goal Achievement: 04/02/19 Potential to Achieve Goals: Good  OT Frequency: Min 2X/week   Barriers to D/C:            Co-evaluation PT/OT/SLP Co-Evaluation/Treatment: Yes Reason for Co-Treatment: For patient/therapist safety;Complexity of the patient's impairments (multi-system involvement) PT goals addressed during session: Mobility/safety with mobility OT goals addressed during session: ADL's and self-care      AM-PAC OT "6 Clicks" Daily Activity     Outcome Measure Help from another person eating meals?: A Little Help from another person taking care of personal grooming?: A Little Help from another person toileting, which includes using toliet, bedpan, or urinal?: Total Help from another person bathing (including washing, rinsing, drying)?: A Lot Help from another person to put on and taking off regular upper body clothing?: A Little Help from another person to put on and taking off regular lower body clothing?: Total 6 Click Score: 13   End of Session Equipment Utilized During Treatment: Gait belt;Rolling walker Nurse Communication: Mobility status  Activity Tolerance: Patient tolerated treatment well  Patient left: in bed;with call bell/phone within reach;with bed alarm set  OT Visit Diagnosis: Unsteadiness on feet  (R26.81);Muscle weakness (generalized) (M62.81);Other symptoms and signs involving cognitive function                Time: 6720-9198 OT Time Calculation (min): 20 min Charges:  OT General Charges $OT Visit: 1 Visit OT Evaluation $OT Eval Moderate Complexity: [place], OT [employer] Pager [phone] Office [phone]   Raymondo Band 03/19/2019, 1:32 PM

## 2019-03-19 NOTE — TOC Initial Note (Addendum)
Transition of Care [facility]) - Initial/Assessment Note    Patient Details  Name: [name] MRN: [id number] Date of Birth: 04/09/1950  Transition of Care [facility]) CM/SW Contact:    Maryclare Labrador, RN Phone Number: 03/19/2019, 10:41 AM  Clinical Narrative:     PTA from [facility].  CM spoke with pt via phone.  Pt informed CM that he has been at the facility for approximately 1 year - pt plans to return to facility.  Pt in agreement for CM to reach out to facility.  CM was able to speak with Clarene Critchley from the facility.  Per facility;  Pt's covid status is not a barrier with pt returning and facilitly as pt was covid positive prior to this admit and was already [facility].   However, facility has concerns with mobility limitations that were barriers prior to his admit (due to deconditioning associated with COVID).  Facility requesting PT eval and clinicals be faxed to [phone] for clinician review before decision can be made to accept pt back into facility.               Clinicals faxed as requested.  PT assessment still pending.  Update: CM faxed PT notes to [facility]  Expected Discharge Plan: Assisted Living Barriers to Discharge: Continued Medical Work up   Patient Goals and CMS Choice Patient states their goals for this hospitalization and ongoing recovery are:: Pt states he is just ready to return home, pt informed CM that he has been blind and unsteady balance for 20 years.  "I just want to get back home"      Expected Discharge Plan and Services Expected Discharge Plan: Assisted Living       Living arrangements for the past 2 months: [place]                                      Prior Living Arrangements/Services Living arrangements for the past 2 months: [place] Lives with:: Self Patient language and need for interpreter reviewed:: Yes Do you feel safe going back to the place where  you live?: Yes      Need for Family Participation in Patient Care: No (Comment) Care giver support system in place?: Yes (comment)   Criminal Activity/Legal Involvement Pertinent to Current Situation/Hospitalization: No - Comment as needed  Activities of Daily Living Home Assistive Devices/Equipment: Blood pressure cuff, Scales, Grab bars around toilet, Grab bars in shower, Hand-held shower hose, Shower chair with back, Hospital bed, Walker (specify type)(front wheeled walker-[facility] has necessary equipment for their residents) ADL Screening (condition at time of admission) Patient's cognitive ability adequate to safely complete daily activities?: Yes Is the patient deaf or have difficulty hearing?: Yes(very hoh) Does the patient have difficulty seeing, even when wearing glasses/contacts?: No Does the patient have difficulty concentrating, remembering, or making decisions?: No Patient able to express need for assistance with ADLs?: Yes Does the patient have difficulty dressing or bathing?: Yes Independently performs ADLs?: No Communication: Independent Dressing (OT): Needs assistance Is this a change from baseline?: Pre-admission baseline Grooming: Needs assistance Is this a change from baseline?: Pre-admission baseline Feeding: Needs assistance Is this a change from baseline?: Pre-admission baseline Bathing: Needs assistance Is this a change from baseline?: Pre-admission baseline Toileting: Needs assistance Is this a change from baseline?: Pre-admission baseline In/Out Bed: Needs assistance Is  this a change from baseline?: Pre-admission baseline Walks in Home: Needs assistance Is this a change from baseline?: Pre-admission baseline Does the patient have difficulty walking or climbing stairs?: Yes(secondary to weakness) Weakness of Legs: Both Weakness of Arms/Hands: None  Permission Sought/Granted   Permission granted to share information with : Yes, Verbal Permission  Granted     Permission granted to share info w AGENCY: [facility]        Emotional Assessment   Attitude/Demeanor/Rapport: Engaged, Charismatic, Gracious Affect (typically observed): Accepting Orientation: : Oriented to Self, Oriented to Place, Oriented to  Time, Oriented to Situation   Psych Involvement: No (comment)  Admission diagnosis:  Generalized weakness [R53.1] Fall at nursing home, initial encounter [W19.XXXA, Y92.129] Skin tear of right forearm without complication, initial encounter [S51.811A] COVID-19 virus infection [U07.1] Pneumonia due to COVID-19 virus [U07.1, J12.89] Patient Active Problem List   Diagnosis Date Noted  . COVID-19 virus infection 03/14/2019  . Generalized weakness 03/14/2019  . Secondary and unspecified malignant neoplasm of intrathoracic lymph nodes ([place]) 11/15/2018  . Pressure injury of skin 03/21/2018  . Other pancytopenia ([place]) 03/17/2018  . CAP (community acquired pneumonia) 03/16/2018  . HCAP (healthcare-associated pneumonia) 03/16/2018  . Other persistent atrial fibrillation ([place]) 02/15/2018  . Renal cell cancer, right ([place]) 04/19/2017  . Macrocytic anemia 04/11/2016  . Diarrhea 04/11/2016  . Metastatic renal cell carcinoma ([place]) 04/07/2016  . Hx of exploratory laparotomy 04/07/2016  . Shock circulatory ([place]) 04/07/2016  . Pre-operative cardiovascular examination 02/19/2016  . Bacteremia   . Abnormal CT of the abdomen   . Abdominal pain 11/01/2015  . UTI (lower urinary tract infection) 11/01/2015  . Sepsis ([place]) 11/01/2015  . Bilateral blindness 06/16/2015  . Carcinoma of kidney ([place]) 06/16/2015  . Type 2 diabetes mellitus ([place]) 06/16/2015  . Intra-abdominal infection 05/11/2015  . Chronic anemia 05/11/2015  . Infection in abdomen ([place]) 05/11/2015  . Second degree AV block, Mobitz type I 02/15/2015  . Atrioventricular block, Mobitz type 1, Wenckebach 02/15/2015  . Hypotension 02/14/2015  . Hypoglycemia 02/13/2015  . DM  type 2, goal A1c below 7 02/13/2015  . Blind   . DM type 2 (diabetes mellitus, type 2) ([place])   . Clear cell renal cell carcinoma (HCC)   . CAD in native artery 09/10/2014  . Renal carcinoma ([place])   . Non-intractable cyclical vomiting with nausea   . Hydronephrosis 07/28/2014  . Coccyx pain 01/03/2014  . Thrombocytopenia ([place]) 01/03/2014  . Intertrochanteric fracture of left hip ([place]) 12/30/2013  . Dialysis patient ([place]) 12/30/2013  . Hip fracture ([place]) 12/30/2013  . Dependence on renal dialysis ([place]) 12/30/2013  . Hypotension of hemodialysis 06/19/2013  . Malignant neoplasm of prostate ([place]) 01/30/2012  . Diabetes mellitus ([place]) 08/30/2011  . Peripheral vascular disease ([place]) 05/27/2009  . COLONIC POLYPS 02/12/2009  . ANEMIA 01/27/2009  . GLAUCOMA 01/27/2009  . Essential hypertension 01/27/2009  . ESRD (end stage renal disease) on dialysis ([place]) 01/27/2009  . End stage renal failure on dialysis ([place]) 01/27/2009  . DIAB W/UNS COMP TYPE II/UNS NOT STATED UNCNTRL 01/08/2008  . HYPERLIPIDEMIA-MIXED 01/08/2008  . OVERWEIGHT/OBESITY 01/08/2008  . DEPRESSIVE DISORDER NOT ELSEWHERE CLASSIFIED 01/08/2008  . Hx of CABG-2013 01/08/2008  . EDEMA 01/08/2008  . Clinical depression 01/08/2008  . HLD (hyperlipidemia) 01/08/2008   PCP:  [name], MD Pharmacy:   [id number] - [place], [place]. AT [address] [place] [address] Phone: [phone] Fax: [phone]  Social Determinants of Health (SDOH) Interventions    Readmission Risk Interventions No flowsheet data found.

## 2019-03-19 NOTE — Progress Notes (Signed)
Patient transported down the hall to room [phone] for dialysis.

## 2019-03-19 NOTE — Evaluation (Signed)
Physical Therapy Evaluation Patient Details Name: [name] MRN: [id number] DOB: September 20, 1950 Today's Date: 03/19/2019   History of Present Illness  68 yo admitted from [facility] with weakness and COVID +. Pt with sacral wound. PMhx: ESRD, bil blindness, hypotension on midodrine, DM, HLD, prostate CA, falls  Clinical Impression  Pt pleasant reporting fatigue from early AM HD and progressive weakness recently. Pt reports he can normally walk around ALF with rollator but has not yet memorized layout of facility to move unassisted. Pt does endorse rapid fatigue and currently requires mod +2 assist for OOB and very limited stepping. Pt with decreased strength, balance, transfers, function and gait who will benefit from acute therapy to maximize mobility, safety and function to decrease burden of care.     Follow Up Recommendations Supervision for mobility/OOB;SNF    Equipment Recommendations  None recommended by PT    Recommendations for Other Services       Precautions / Restrictions Precautions Precautions: Fall Precaution Comments: blind Restrictions Weight Bearing Restrictions: No      Mobility  Bed Mobility Overal bed mobility: Needs Assistance Bed Mobility: Supine to Sit;Sit to Supine     Supine to sit: Mod assist;+2 for physical assistance Sit to supine: Mod assist;+2 for physical assistance   General bed mobility comments: physical assist and increased time to elevate trunk from surface with mod cues and +2 assist to achieve upright. Return to bed pt with assist to lift legs and control trunk. Pt with preference for right sidelying due to sacral wound  Transfers Overall transfer level: Needs assistance   Transfers: Sit to/from Stand Sit to Stand: Mod assist;+2 physical assistance         General transfer comment: assist to rise from elevated bed with cues for hand placement x 2 trials with assist for pericare in  standing  Ambulation/Gait Ambulation/Gait assistance: Min assist;+2 safety/equipment Gait Distance (Feet): 2 Feet Assistive device: Rolling walker (2 wheeled) Gait Pattern/deviations: Shuffle;Trunk flexed   Gait velocity interpretation: <1.8 ft/sec, indicate of risk for recurrent falls General Gait Details: pt able to take a few shuffling steps forward and back with RW with min assist for control and verbal cues for direction with pt fatiguing quickly and unable to progress with gait  Stairs            Wheelchair Mobility    Modified Rankin (Stroke Patients Only)       Balance Overall balance assessment: Needs assistance Sitting-balance support: Feet supported;Bilateral upper extremity supported Sitting balance-Leahy Scale: Fair Sitting balance - Comments: pt with tendency for right lean particularly initially with EOB   Standing balance support: Bilateral upper extremity supported Standing balance-Leahy Scale: Poor Standing balance comment: reliant on bil UE support and assist                             Pertinent Vitals/Pain Pain Assessment: 0-10 Pain Score: 6  Pain Location: generalized Pain Descriptors / Indicators: Aching Pain Intervention(s): Limited activity within patient's tolerance;Monitored during session;Repositioned    Home Living Family/patient expects to be discharged to:: Assisted living               Home Equipment: Walker - 4 wheels;Wheelchair - [employer] - 2 wheels;Shower seat;Grab bars - tub/shower;Grab bars - toilet      Prior Function Level of Independence: Needs assistance   Gait / Transfers Assistance Needed: walks with rollator, WC in community  ADL's / Homemaking  Assistance Needed: aide for bathing, dressing. Staff does the homemaking  Comments: blind bil eyes removed     Hand Dominance        Extremity/Trunk Assessment   Upper Extremity Assessment Upper Extremity Assessment: Defer to OT evaluation     Lower Extremity Assessment Lower Extremity Assessment: Generalized weakness    Cervical / Trunk Assessment Cervical / Trunk Assessment: Kyphotic  Communication   Communication: No difficulties  Cognition Arousal/Alertness: Awake/alert Behavior During Therapy: WFL for tasks assessed/performed Overall Cognitive Status: Impaired/Different from baseline Area of Impairment: Safety/judgement                         Safety/Judgement: Decreased awareness of deficits            General Comments      Exercises     Assessment/Plan    PT Assessment Patient needs continued PT services  PT Problem List Decreased activity tolerance;Decreased balance;Decreased range of motion;Decreased strength;Decreased mobility;Decreased cognition;Decreased safety awareness       PT Treatment Interventions Gait training;DME instruction;Therapeutic exercise;Balance training;Functional mobility training;Therapeutic activities;Patient/family education;Cognitive remediation    PT Goals (Current goals can be found in the Care Plan section)  Acute Rehab PT Goals Patient Stated Goal: return to ALF PT Goal Formulation: With patient Time For Goal Achievement: 04/02/19 Potential to Achieve Goals: Fair    Frequency Min 2X/week   Barriers to discharge Decreased caregiver support      Co-evaluation PT/OT/SLP Co-Evaluation/Treatment: Yes Reason for Co-Treatment: Complexity of the patient's impairments (multi-system involvement);For patient/therapist safety PT goals addressed during session: Mobility/safety with mobility         AM-PAC PT "6 Clicks" Mobility  Outcome Measure Help needed turning from your back to your side while in a flat bed without using bedrails?: A Lot Help needed moving from lying on your back to sitting on the side of a flat bed without using bedrails?: A Lot Help needed moving to and from a bed to a chair (including a wheelchair)?: A Lot Help needed standing up  from a chair using your arms (e.g., wheelchair or bedside chair)?: A Lot Help needed to walk in hospital room?: A Lot Help needed climbing 3-5 steps with a railing? : Total 6 Click Score: 11    End of Session Equipment Utilized During Treatment: Gait belt Activity Tolerance: Patient tolerated treatment well Patient left: in bed;with call bell/phone within reach;with bed alarm set;Other (comment)(floor mats) Nurse Communication: Mobility status PT Visit Diagnosis: Other abnormalities of gait and mobility (R26.89)    Time: 5035-4656 PT Time Calculation (min) (ACUTE ONLY): 20 min   Charges:   PT Evaluation $PT Eval Moderate Complexity: 1 Mod          [name], PT Acute Rehabilitation Services Pager: [phone] Office: [phone]   [name] 03/19/2019, 12:10 PM

## 2019-03-19 NOTE — Discharge Instructions (Signed)

## 2019-03-19 NOTE — Progress Notes (Addendum)
Electrophysiology Rounding Note  Patient Name: [name] Date of Encounter: 03/19/2019  Primary Cardiologist: No primary care provider on file. Electrophysiologist: New   Subjective   No change. Overall satting well.  HRs remain in 30-40s. No new complaints.  Inpatient Medications    Scheduled Meds: . albuterol  2 puff Inhalation Q6H  . atorvastatin  10 mg Oral Daily  . Chlorhexidine Gluconate Cloth  6 each Topical Q0600  . darbepoetin (ARANESP) injection - DIALYSIS  40 mcg Subcutaneous Once  . dexamethasone (DECADRON) injection  6 mg Intravenous Q24H  . mouth rinse  15 mL Mouth Rinse BID  . midodrine  5 mg Oral TID WC  . multivitamin  1 tablet Oral QHS  . sevelamer carbonate  4,800 mg Oral TID WC  . Warfarin - Pharmacist Dosing Inpatient   Does not apply q1800   Continuous Infusions: . sodium chloride 1,000 mL (03/14/19 0851)   PRN Meds: sodium chloride, acetaminophen, cyclobenzaprine, ondansetron **OR** ondansetron (ZOFRAN) IV, sevelamer carbonate   Vital Signs    Vitals:   03/19/19 0430 03/19/19 0500 03/19/19 0530 03/19/19 0800  BP: 124/73 118/65 (!) 121/101 121/65  Pulse: (!) 53 (!) 52 (!) 53 (!) 54  Resp: 20 19 18 16   Temp:   97.8 F (36.6 C)   TempSrc:   Oral   SpO2:   92% 95%  Weight:      Height:        Intake/Output Summary (Last 24 hours) at 03/19/2019 1005 Last data filed at 03/19/2019 0530 Gross per 24 hour  Intake 37.49 ml  Output 1498 ml  Net -1460.51 ml   Filed Weights   03/15/19 0346 03/16/19 0025 03/16/19 0330  Weight: 113.4 kg 111 kg 109.8 kg    Physical Exam    GEN- The patient is chronically ill appearing, alert and oriented x 3 today.   Head- normocephalic, atraumatic Eyes-  Sclera clear, conjunctiva pink Ears- hearing intact Oropharynx- clear Neck- supple Lungs- Clear to ausculation bilaterally, normal work of breathing Heart- Slow rate and rhythm, no murmurs, rubs or gallops GI- soft, NT, ND, + BS Extremities- no  clubbing, cyanosis, or edema Skin- no rash or lesion Psych- euthymic mood, full affect Neuro- strength and sensation are intact  Labs    CBC Recent Labs    03/18/19 0458 03/19/19 0500  WBC 3.6* 5.4  NEUTROABS 3.1 4.9  HGB 9.4* 10.0*  HCT 28.0* 29.5*  MCV 106.5* 105.0*  PLT 114* 762*   Basic Metabolic Panel Recent Labs    03/17/19 1114 03/18/19 0458 03/19/19 0500  NA  --  143 142  K  --  4.3 3.7  CL  --  100 100  CO2  --  21* 22  GLUCOSE  --  260* 213*  BUN  --  116* 94*  CREATININE  --  11.35* 9.15*  CALCIUM  --  8.6* 8.8*  MG 2.4 2.5*  --   PHOS  --  9.7* 7.0*   Liver Function Tests Recent Labs    03/16/19 1235 03/17/19 0408 03/18/19 0458 03/19/19 0500  AST 21 20  --   --   ALT 18 17  --   --   ALKPHOS 80 93  --   --   BILITOT 1.2 0.9  --   --   PROT 5.0* 5.5*  --   --   ALBUMIN 2.3* 2.5* 2.5* 2.7*   No results for input(s): LIPASE, AMYLASE in the last 72 hours.  Cardiac Enzymes No results for input(s): CKTOTAL, CKMB, CKMBINDEX, TROPONINI in the last 72 hours.   Telemetry    AF with slow VR in 30-40s(personally reviewed)  Radiology    No results found.  Patient Profile     [name] is a 68 y.o. male with a history of COVID infection (Positive 03/15/2019), ESRD on HD T/Th/S, Blindness, Chronic hypotension on midodrine, CAD x/p CABG 2013, variable AV block, and persistent atria lfibrillation who is being seen today for the evaluation of tachy-brady syndrome in AF RVR at the request of Dr. Audie Box.   Assessment & Plan    1.  Chronic bradycardia with intermittent AV block Relatively asymptomatic, and overall very poor candidate for PPM insertion.  Note from Dr. Percival Spanish 11/2016 reviewed a holter monitor with episode HR as low as 29 with episodic 2:1 block and Sinus brady Avoid AV nodal blocking agents  2. COVID infection Ongoing. Tested positive 03/15/2019.  3. ESRD on HD T/Th/S HD  Overall poor candidate for EP procedures. Only  possible pacing would be leadless. Further plan per MD.   For questions or updates, please contact [place] Please consult [email or web address] for contact info under Cardiology/STEMI.  Signed, Shirley Friar, PA-C  03/19/2019, 10:05 AM   EP Attending  Agree with above. The patient will undergo watchful waiting. He is not a good PM candidate. Hopefully his HR will improve as/if he improves.  Mikle Bosworth.D.

## 2019-03-19 NOTE — Progress Notes (Signed)
Patient declines eating lunch; renvela held.

## 2019-03-19 NOTE — Progress Notes (Signed)
Patient's sons [name] and Mitzi Hansen updated on patient's condition via three-way call.

## 2019-03-19 NOTE — Progress Notes (Signed)
ANTICOAGULATION CONSULT NOTE - Follow-Up  Pharmacy Consult for Warfarin Indication: atrial fibrillation  Patient Measurements: Height: 6\' 4"  (193 cm) Weight: 242 lb 1 oz (109.8 kg) IBW/kg (Calculated) : 86.8  Vital Signs: Temp: 97.8 F (36.6 C) (12/15 0530) Temp Source: Oral (12/15 0530) BP: 121/65 (12/15 0800) Pulse Rate: 54 (12/15 0800)  Labs: Recent Labs    03/17/19 0408 03/17/19 1149 03/18/19 0458 03/19/19 0500  HGB 9.1*  --  9.4* 10.0*  HCT 27.9*  --  28.0* 29.5*  PLT 88*  --  114* 129*  APTT  --  46*  --   --   LABPROT  --  15.2 16.8* 29.6*  INR  --  1.2 1.4* 2.8*  CREATININE 10.23*  --  11.35* 9.15*    Estimated Creatinine Clearance: 10.3 mL/min (A) (by C-G formula based on SCr of 9.15 mg/dL (H)).   Medical History: Past Medical History:  Diagnosis Date  . Anemia   . Arthritis    "back, neck" (07/28/2014)  . Blind    both eyes removed   . Bruises easily   . CAD, NATIVE VESSEL 01/08/2008      . Cellulitis late 1980's   "hospitalized; wrapped both legs; several times; no OR for this"  . Colon polyps   . Diabetic neuropathy ([place])   . Dialysis patient ([place])   . DM type 2 (diabetes mellitus, type 2) ([place])    no medications (07/28/2014) diet and excersie controlled not onmeds for 14-15 years   . Dysrhythmia    afib   . ESRD (end stage renal disease) on dialysis [facility])    [name]; Mon, Wed, Fri (07/28/2014)  . Fall    hx of fall and required left hip surgery   . Family history of breast cancer    mother  . Heart murmur    hx of  . History of blood product transfusion   . HYPERLIPIDEMIA-MIXED 01/08/2008  . HYPERTENSION 01/27/2009   BP low , hx of HTN, Currently on meds to raise BP  . Hypotension   . Kidney stones   . Low blood pressure   . OVERWEIGHT/OBESITY 01/08/2008   Lost 205 lbs through diet and exercise.    . Peripheral vascular disease ([place])    vein stripping  . Pneumonia 2000;s X 1  . Prostate cancer ([place])   . Prosthetic eye globe    both eyes  . Renal cell carcinoma 2001 and 2003   "both kidneys"  . Renal insufficiency      Assessment: 68 yo male presented on 03/13/2019 with weakness found to be positive for COVID-19. Patient has a history of slow Afib and was evaluated by cardiology on 02/15/2018 and recommended to begin anticoagulation but the patient did not follow up. Pharmacy has been consulted to dose warfarin for Afib. CHAD2-VASc score of 3. Patient is currently in AFib with HR in 38s. Hgb 9.1. Plt 88 (at approximate baseline). No reported bleeding   INR today is therapeutic however with a large jump in INR thought to perhaps be erroneous but recheck confirms elevated (INR 3.2/2.8 << << 1.4, goal of 2-3). CBC stable and trending up - no bleeding noted. Given the large jump up in INR - will hold the warfarin dose tonight as it is expected to continue to trend up.   The patient was educated on warfarin via phone today and the education sheet was placed in the discharge AVS.   Goal of Therapy:  INR 2-3 Monitor platelets  by anticoagulation protocol: Yes   Plan:  - Hold Warfarin dose today - Daily PT/INR, CBC q72h - Will continue to monitor for any signs/symptoms of bleeding and will follow up with PT/INR in the a.m.    Thank you for allowing pharmacy to be a part of this patient's care.  Alycia Rossetti, PharmD, BCPS Clinical Pharmacist Clinical phone for 03/19/2019: [phone] 03/19/2019 11:09 AM   **Pharmacist phone directory can now be found on [email or web address] ([email or web address]).  Listed under [place].

## 2019-03-19 NOTE — Progress Notes (Signed)
Patient's sons [name] and Mitzi Hansen via three way call updated on patient's plan of care.

## 2019-03-20 ENCOUNTER — Encounter (HOSPITAL_COMMUNITY): Payer: Self-pay | Admitting: Internal Medicine

## 2019-03-20 LAB — CBC WITH DIFFERENTIAL/PLATELET
Abs Immature Granulocytes: 0.17 10*3/uL — ABNORMAL HIGH (ref 0.00–0.07)
Basophils Absolute: 0 10*3/uL (ref 0.0–0.1)
Basophils Relative: 0 %
Eosinophils Absolute: 0 10*3/uL (ref 0.0–0.5)
Eosinophils Relative: 0 %
HCT: 29 % — ABNORMAL LOW (ref 39.0–52.0)
Hemoglobin: 9.6 g/dL — ABNORMAL LOW (ref 13.0–17.0)
Immature Granulocytes: 3 %
Lymphocytes Relative: 2 %
Lymphs Abs: 0.1 10*3/uL — ABNORMAL LOW (ref 0.7–4.0)
MCH: 35.6 pg — ABNORMAL HIGH (ref 26.0–34.0)
MCHC: 33.1 g/dL (ref 30.0–36.0)
MCV: 107.4 fL — ABNORMAL HIGH (ref 80.0–100.0)
Monocytes Absolute: 0.3 10*3/uL (ref 0.1–1.0)
Monocytes Relative: 6 %
Neutro Abs: 4.8 10*3/uL (ref 1.7–7.7)
Neutrophils Relative %: 89 %
Platelets: 123 10*3/uL — ABNORMAL LOW (ref 150–400)
RBC: 2.7 MIL/uL — ABNORMAL LOW (ref 4.22–5.81)
RDW: 14.5 % (ref 11.5–15.5)
WBC: 5.4 10*3/uL (ref 4.0–10.5)
nRBC: 0.4 % — ABNORMAL HIGH (ref 0.0–0.2)

## 2019-03-20 LAB — RENAL FUNCTION PANEL
Albumin: 2.5 g/dL — ABNORMAL LOW (ref 3.5–5.0)
Anion gap: 21 — ABNORMAL HIGH (ref 5–15)
BUN: 122 mg/dL — ABNORMAL HIGH (ref 8–23)
CO2: 22 mmol/L (ref 22–32)
Calcium: 8.4 mg/dL — ABNORMAL LOW (ref 8.9–10.3)
Chloride: 99 mmol/L (ref 98–111)
Creatinine, Ser: 10.91 mg/dL — ABNORMAL HIGH (ref 0.61–1.24)
GFR calc Af Amer: 5 mL/min — ABNORMAL LOW (ref >60)
GFR calc non Af Amer: 4 mL/min — ABNORMAL LOW (ref >60)
Glucose, Bld: 366 mg/dL — ABNORMAL HIGH (ref 70–99)
Phosphorus: 8.6 mg/dL — ABNORMAL HIGH (ref 2.5–4.6)
Potassium: 4.4 mmol/L (ref 3.5–5.1)
Sodium: 142 mmol/L (ref 135–145)

## 2019-03-20 LAB — PROTIME-INR
INR: 5.9 (ref 0.8–1.2)
Prothrombin Time: 52.9 seconds — ABNORMAL HIGH (ref 11.4–15.2)

## 2019-03-20 LAB — FERRITIN: Ferritin: 2536 ng/mL — ABNORMAL HIGH (ref 24–336)

## 2019-03-20 NOTE — Progress Notes (Addendum)
Inpatient Diabetes Program Recommendations  AACE/ADA: New Consensus Statement on Inpatient Glycemic Control (2015)  Target Ranges:  Prepandial:   less than 140 mg/dL      Peak postprandial:   less than 180 mg/dL (1-2 hours)      Critically ill patients:  140 - 180 mg/dL   Lab Results  Component Value Date   GLUCAP 232 (H) 03/19/2019   HGBA1C 4.8 04/09/2016    Review of Glycemic Control  Diabetes history: DM 2 Outpatient Diabetes mdications: Januvia 25 mg Daily Current orders for Inpatient glycemic control: None  Glucose 366 this am Decadron 6 mg Q24 hours  Inpatient Diabetes Program Recommendations:    -  Consider Levemir 5 units bid -  Novolog "very sensitive starting at 151 mg/dl 0-6 units tid -  Add Novolog HS coverage.  Discussed plan of care with Dr. Mal Misty via secure chat. MD to assess on rounds today.  Thanks,  Tama Headings RN, MSN, BC-ADM Inpatient Diabetes Coordinator Team Pager [phone] (8a-5p)

## 2019-03-20 NOTE — Progress Notes (Addendum)
Progress Note    [name]  EXH:[id number] DOB: [birth year]  DOA: 03/13/2019 PCP: [name], MD      Brief Narrative:    Medical records reviewed and are as summarized below:  [name] is an 68 y.o. male medical history significant for ESRD on hemodialysis, atrial fibrillation on warfarin, chronic hypotension on midodrine, legal blindness. He was brought to the hospital because of generalized weakness and tested positive for corona virus infection.      Assessment/Plan:   Principal Problem:   COVID-19 virus infection Active Problems:   ESRD (end stage renal disease) on dialysis (HCC)   Hypotension of hemodialysis   Thrombocytopenia (HCC)   DM type 2 (diabetes mellitus, type 2) (HCC)   Bilateral blindness   Generalized weakness   Body mass index is 29.47 kg/m.   COVID-19 pneumonia: Completed IV remdesivir on 03/18/2019. IV dexamethasone to be completed on 03/26/2019. Completed IV ceftriaxone and Rocephin.  Significant sinus bradycardia: Asymptomatic. Heart rate has been between 30 and 40s. He was seen by cardiologist the specialist) who said patient is a poor candidate for pacemaker placement.  Chronic hypotension: He is on midodrine  ESRD: Follow-up with nephrologist for hemodialysis  Paroxysmal atrial fibrillation: He is on warfarin for long-term anticoagulation. INR supratherapeutic. Hold warfarin and monitor INR. Follow-up with pharmacist for warfarin dosing.  Thrombocytopenia: Platelet count improved and stable  Debility: PT and OT  Consulted palliative care to address goals of care.   Family Communication/Anticipated D/C date and plan/Code Status   DVT prophylaxis: Warfarin Code Status: DNR Family Communication: Called his son, [name], but there was no response so I left a voicemail Disposition Plan: To be determined      Subjective:   Denies any complaints. No shortness of breath or chest pain  Objective:    Vitals:   03/20/19 0450 03/20/19 0700 03/20/19 0900 03/20/19 1000  BP:  (!) 113/58 (!) 117/98 123/72  Pulse: (!) 48 (!) 36 (!) 53 (!) 51  Resp: (!) 21 12 (!) 22 10  Temp:      TempSrc:      SpO2: 99% 100% 97% 97%  Weight:      Height:        Intake/Output Summary (Last 24 hours) at 03/20/2019 1046 Last data filed at 03/20/2019 1000 Gross per 24 hour  Intake 355 ml  Output --  Net 355 ml   Filed Weights   03/15/19 0346 03/16/19 0025 03/16/19 0330  Weight: 113.4 kg 111 kg 109.8 kg    Exam: GEN: NAD SKIN: Sacral decubitus ulcer present on admission EYES: legally blind ENT: MMM CV: RRR PULM: CTA B ABD: soft, ND, NT, +BS CNS: Alert, confused, non focal EXT: No edema or tenderness        Data Reviewed:   I have personally reviewed following labs and imaging studies:  Labs: Labs show the following:   Basic Metabolic Panel: Recent Labs  Lab 03/16/19 0500 03/16/19 1235 03/17/19 0408 03/17/19 1114 03/18/19 0458 03/19/19 0500 03/20/19 0355  NA 144 143 145  --  143 142 142  K 3.8 3.6 4.3  --  4.3 3.7 4.4  CL 101 103 101  --  100 100 99  CO2 25 24 22   --  21* 22 22  GLUCOSE 105* 152* 192*  --  260* 213* 366*  BUN 72* 76* 90*  --  116* 94* 122*  CREATININE 9.09* 9.52* 10.23*  --  11.35* 9.15* 10.91*  CALCIUM 8.3* 8.0* 8.3*  --  8.6* 8.8* 8.4*  MG 2.2  --   --  2.4 2.5*  --   --   PHOS 6.9*  --  8.7*  --  9.7* 7.0* 8.6*   GFR Estimated Creatinine Clearance: 8.7 mL/min (A) (by C-G formula based on SCr of 10.91 mg/dL (H)). Liver Function Tests: Recent Labs  Lab 03/14/19 0700 03/14/19 0857 03/16/19 1235 03/17/19 0408 03/18/19 0458 03/19/19 0500 03/20/19 0355  AST  --  23 21 20   --   --   --   ALT 15 16 18 17   --   --   --   ALKPHOS  --  76 80 93  --   --   --   BILITOT  --  0.9 1.2 0.9  --   --   --   PROT  --  5.5* 5.0* 5.5*  --   --   --   ALBUMIN  --  2.8* 2.3* 2.5* 2.5* 2.7* 2.5*   No results for input(s): LIPASE, AMYLASE in the last 168 hours. No  results for input(s): AMMONIA in the last 168 hours. Coagulation profile Recent Labs  Lab 03/17/19 1149 03/18/19 0458 03/19/19 0500 03/19/19 0955 03/20/19 0355  INR 1.2 1.4* 2.8* 3.2* 5.9*    CBC: Recent Labs  Lab 03/16/19 0500 03/17/19 0408 03/17/19 1208 03/18/19 0458 03/19/19 0500 03/20/19 0355  WBC 2.0* 1.2*  --  3.6* 5.4 5.4  NEUTROABS 1.4* 1.0*  --  3.1 4.9 4.8  HGB 8.9* 9.1*  --  9.4* 10.0* 9.6*  HCT 26.9* 27.9* 27.7* 28.0* 29.5* 29.0*  MCV 108.9* 107.7*  --  106.5* 105.0* 107.4*  PLT 89* 88*  --  114* 129* 123*   Cardiac Enzymes: No results for input(s): CKTOTAL, CKMB, CKMBINDEX, TROPONINI in the last 168 hours. BNP (last 3 results) No results for input(s): PROBNP in the last 8760 hours. CBG: Recent Labs  Lab 03/15/19 2337 03/16/19 0022 03/19/19 0209  GLUCAP 66* 165* 232*   D-Dimer: Recent Labs    03/18/19 0458 03/19/19 0500  DDIMER 3.41* 2.98*   Hgb A1c: No results for input(s): HGBA1C in the last 72 hours. Lipid Profile: No results for input(s): CHOL, HDL, LDLCALC, TRIG, CHOLHDL, LDLDIRECT in the last 72 hours. Thyroid function studies: No results for input(s): TSH, T4TOTAL, T3FREE, THYROIDAB in the last 72 hours.  Invalid input(s): FREET3 Anemia work up: Recent Labs    03/17/19 1208 03/19/19 0500 03/20/19 [place] 2,879*  --   --   FERRITIN  --  2,885* 2,536*   Sepsis Labs: Recent Labs  Lab 03/14/19 7096 03/14/19 0648 03/17/19 0408 03/18/19 0458 03/19/19 0500 03/20/19 0355  PROCALCITON 2.18  --   --   --   --   --   WBC  --   --  1.2* 3.6* 5.4 5.4  LATICACIDVEN  --  0.9  --   --   --   --     Microbiology Recent Results (from the past 240 hour(s))  Culture, blood (routine x 2)     Status: None   Collection Time: 03/14/19  8:57 AM   Specimen: BLOOD  Result Value Ref Range Status   Specimen Description   Final    BLOOD RIGHT ANTECUBITAL Performed at [facility], [place] [address], [place],  [place] [address]    Special Requests   Final    BOTTLES DRAWN AEROBIC AND ANAEROBIC Blood Culture adequate volume Performed  at [facility], [place] [address], [place], [place] [address]    Culture   Final    NO GROWTH 5 DAYS Performed at [facility], [place] [address], [place], [place] [address]    Report Status 03/19/2019 FINAL  Final  Culture, blood (routine x 2)     Status: None   Collection Time: 03/14/19  8:58 AM   Specimen: BLOOD  Result Value Ref Range Status   Specimen Description   Final    BLOOD BLOOD RIGHT FOREARM Performed at [place] [address] [place], [place] [address]    Special Requests   Final    BOTTLES DRAWN AEROBIC AND ANAEROBIC Blood Culture adequate volume Performed at [place] [address] [place], [place] [address]    Culture   Final    NO GROWTH 5 DAYS Performed at [facility], [place] [address], [place], [place] [address]    Report Status 03/19/2019 FINAL  Final  MRSA PCR Screening     Status: None   Collection Time: 03/15/19  3:08 AM   Specimen: Nasopharyngeal  Result Value Ref Range Status   MRSA by PCR NEGATIVE NEGATIVE Final    Comment:        The GeneXpert MRSA Assay (FDA approved for NASAL specimens only), is one component of a comprehensive MRSA colonization surveillance program. It is not intended to diagnose MRSA infection nor to guide or monitor treatment for MRSA infections. Performed at [facility], [place] [address] [place], [place] [address]   SARS CORONAVIRUS 2 (TAT 6-24 HRS) Nasopharyngeal Nasopharyngeal Swab     Status: Abnormal   Collection Time: 03/15/19  6:01 AM   Specimen: Nasopharyngeal Swab  Result Value Ref Range Status   SARS Coronavirus 2 POSITIVE (A) NEGATIVE Final    Comment: RESULT CALLED TO, READ BACK BY AND VERIFIED WITH: RN JOHN BRIDGES [phone] FCP (NOTE) SARS-CoV-2 target nucleic acids are DETECTED. The SARS-CoV-2 RNA is  generally detectable in upper and lower respiratory specimens during the acute phase of infection. Positive results are indicative of the presence of SARS-CoV-2 RNA. Clinical correlation with patient history and other diagnostic information is  necessary to determine patient infection status. Positive results do not rule out bacterial infection or co-infection with other viruses.  The expected result is Negative. Fact Sheet for Patients: SugarRoll.be Fact Sheet for Healthcare Providers: https://www.woods-mathews.com/ This test is not yet approved or cleared by the [place] FDA and  has been authorized for detection and/or diagnosis of SARS-CoV-2 by FDA under an Emergency Use Authorization (EUA). This EUA will remain  in effect (meaning this test can be used) for the dur ation of the COVID-19 declaration under Section 564(b)(1) of the Act, 21 U.S.C. section 360bbb-3(b)(1), unless the authorization is terminated or revoked sooner. Performed at [facility], [place] [address] [place], [place] [address]     Procedures and diagnostic studies:  No results found.  Medications:   . albuterol  2 puff Inhalation Q6H  . atorvastatin  10 mg Oral Daily  . Chlorhexidine Gluconate Cloth  6 each Topical Q0600  . Chlorhexidine Gluconate Cloth  6 each Topical Q0600  . dexamethasone (DECADRON) injection  6 mg Intravenous Q24H  . mouth rinse  15 mL Mouth Rinse BID  . midodrine  5 mg Oral TID WC  . multivitamin  1 tablet Oral QHS  . sevelamer carbonate  4,800 mg Oral TID WC  .  Warfarin - Pharmacist Dosing Inpatient   Does not apply q1800   Continuous Infusions: . sodium chloride 1,000 mL (03/14/19 0851)     LOS: 6 days   Siya Flurry  [facility]   *Please refer to [email or web address], password [email or web address] to get updated schedule on who will round on this patient, as hospitalists switch teams weekly. If 7PM-7AM, please contact night-coverage at  [email or web address], password [email or web address] for any overnight needs.  03/20/2019, 10:46 AM

## 2019-03-20 NOTE — Progress Notes (Addendum)
  Reviewed tele with Dr. Lovena Le.   Pt remains in significant sinus brady, mostly in 30-40s, dipping occasionally into the 20s over night.   He is a very poor candidate for pacing, and will continue watchful waiting.   Recommend addressing goals of care and considering palliative consultation.   We are available as needed for questions.   Legrand Como [address]" [place], PA-C  03/20/2019 8:11 AM   EP Attending  Agree with above.   Mikle Bosworth.D.

## 2019-03-20 NOTE — Progress Notes (Signed)
PALLIATIVE NOTE:  Referral received for goals of care. Per RN, patient recently went for HD.   I will plan to follow up with patient tomorrow for goals of care discussion. Detailed notes and recommendations to follow.   Thank you for your referral and allowing Palliative to assist in [name]'s care.   Alda Lea, AGPCNP-BC Palliative Medicine Team   NO CHARGE

## 2019-03-20 NOTE — Progress Notes (Signed)
[facility] Progress Note  Subjective:   HD on 12/14 with 1.5 kg UF.  Noted continued sinus brady even to 20's/30's and EP states poor candidate for pacemaker, recommends goals of care discussions.  Not examined in person due to covid with isolation and in keeping with efforts to prevent the spread of infection and to conserve personal protective equipment.  He has been on room air at 95%   Review of systems per nursing Hasn't had any shortness of breath  Ate well at breakfast  No n/v Arthritis discomfort    Vitals:   03/20/19 0450 03/20/19 0700 03/20/19 0900 03/20/19 1000  BP:  (!) 113/58 (!) 117/98 123/72  Pulse: (!) 48 (!) 36 (!) 53 (!) 51  Resp: (!) 21 12 (!) 22 10  Temp:      TempSrc:      SpO2: 99% 100% 97% 97%  Weight:      Height:        Inpatient medications: . albuterol  2 puff Inhalation Q6H  . atorvastatin  10 mg Oral Daily  . Chlorhexidine Gluconate Cloth  6 each Topical Q0600  . Chlorhexidine Gluconate Cloth  6 each Topical Q0600  . dexamethasone (DECADRON) injection  6 mg Intravenous Q24H  . mouth rinse  15 mL Mouth Rinse BID  . midodrine  5 mg Oral TID WC  . multivitamin  1 tablet Oral QHS  . sevelamer carbonate  4,800 mg Oral TID WC  . Warfarin - Pharmacist Dosing Inpatient   Does not apply q1800   . sodium chloride 1,000 mL (03/14/19 0851)   sodium chloride, acetaminophen, cyclobenzaprine, ondansetron **OR** ondansetron (ZOFRAN) IV, sevelamer carbonate    Exam:  Due to the nature of this patient's COVID-19 with isolation and in keeping with efforts to prevent the spread of infection and to conserve personal protective equipment, a physical exam was not personally performed.  Patient's symptoms and exam were discussed in detail with the RN.  A chart review of other providers notes and the patient's lab work as well as review of other pertinent studies was performed.  Exam details from prior documentation were reviewed specifically and  confirmed with the bedside nurse.  Location of service: [place] adult male in bed in no acute distress  HEENT NCAT  pulm clear but diminished in bases; unlabored at rest; on room air  Cardiac Bradycardic with HR 46 on nursing exam per monitor Ext - no LE edema  Neuro conversant  LUE AVF bruit and thrill +   Home meds:  - midodrine 10 tid/ sevelamer carb ac tid  - lipitor 10/  aspirin 81  - januvia 25 mg qd  - prn's/ vitamins/ supplements  - pantoprazole 40    Outpt HD: MWF [facility]  4h   450/800  98kg  2/2 bath  AVF  Hep none  - parsabiv 7.5mg  tiw   - low UF 0.2, 0.8 recently    CXR 12/10 - no edema, RLL infiltrate   Assessment/ Plan: 1. COVID +PNA: admit 12/10, not hypoxic, CXR +infiltrate, low wbc, no fever.  Admitted 03/14/19.  abx and decadron per primary team.  s/p remdesivir  2. Chronic hypotension - BP's runs 70's -90's at OP HD, on midodrine 10 tid outpt -> was decreased to 5 mg TID with bradycardia.  As below, will discontinue midodrine to determine if impacting bradycardia as pressures better here 3. ESRD - on HD per Monday Wednesday Friday schedule.  Treatment time 4 hours as inpatient volumes allow for clearance. Daily weights ordered  4. Bradycardia - note cardiology was consulted; poor candidate for pacemaker.  Not on AV nodal agents; discontinued midodrine for now to determine if effect   5. DM2 per primary team  6. Anemia ckd -no ESA in outpatient orders; aranesp 40 mcg once 12/15  7. MBD ckd - continue binders (not compliant here); renal diet and dialysis; longer tx if able  8. EOL - multiple comorbidities, pt is now DNR.    Claudia Desanctis 03/20/2019 10:49 AM

## 2019-03-20 NOTE — Progress Notes (Signed)
ANTICOAGULATION CONSULT NOTE - Follow-Up  Pharmacy Consult for Warfarin Indication: atrial fibrillation  Patient Measurements: Height: 6\' 4"  (193 cm) Weight: 242 lb 1 oz (109.8 kg) IBW/kg (Calculated) : 86.8  Vital Signs: Temp: 97.8 F (36.6 C) (12/16 0400) Temp Source: Oral (12/16 0400) BP: 124/53 (12/16 0400) Pulse Rate: 48 (12/16 0450)  Labs: Recent Labs    03/17/19 1149 03/17/19 1208 03/18/19 0458 03/19/19 0500 03/19/19 0955 03/20/19 0355  HGB  --    < > 9.4* 10.0*  --  9.6*  HCT  --   --  28.0* 29.5*  --  29.0*  PLT  --   --  114* 129*  --  123*  APTT 46*  --   --   --   --   --   LABPROT 15.2  --  16.8* 29.6* 32.7* 52.9*  INR 1.2  --  1.4* 2.8* 3.2* 5.9*  CREATININE  --   --  11.35* 9.15*  --  10.91*   < > = values in this interval not displayed.    Estimated Creatinine Clearance: 8.7 mL/min (A) (by C-G formula based on SCr of 10.91 mg/dL (H)).   Medical History: Past Medical History:  Diagnosis Date  . Anemia   . Arthritis    "back, neck" (07/28/2014)  . Blind    both eyes removed   . Bruises easily   . CAD, NATIVE VESSEL 01/08/2008      . Cellulitis late 1980's   "hospitalized; wrapped both legs; several times; no OR for this"  . Colon polyps   . Diabetic neuropathy ([place])   . Dialysis patient ([place])   . DM type 2 (diabetes mellitus, type 2) ([place])    no medications (07/28/2014) diet and excersie controlled not onmeds for 14-15 years   . Dysrhythmia    afib   . ESRD (end stage renal disease) on dialysis [facility])    [name]; Mon, Wed, Fri (07/28/2014)  . Fall    hx of fall and required left hip surgery   . Family history of breast cancer    mother  . Heart murmur    hx of  . History of blood product transfusion   . HYPERLIPIDEMIA-MIXED 01/08/2008  . HYPERTENSION 01/27/2009   BP low , hx of HTN, Currently on meds to raise BP  . Hypotension   . Kidney stones   . Low blood pressure   . OVERWEIGHT/OBESITY 01/08/2008   Lost 205 lbs through diet  and exercise.    . Peripheral vascular disease ([place])    vein stripping  . Pneumonia 2000;s X 1  . Prostate cancer ([place])   . Prosthetic eye globe    both eyes  . Renal cell carcinoma 2001 and 2003   "both kidneys"  . Renal insufficiency      Assessment: 68 yo male presented on 03/13/2019 with weakness found to be positive for COVID-19. Patient has a history of slow Afib and was evaluated by cardiology on 02/15/2018 and recommended to begin anticoagulation but the patient did not follow up. Pharmacy has been consulted to dose warfarin for Afib. CHAD2-VASc score of 3. Patient is currently in AFib with HR in 25s. Hgb 9.1. Plt 88 (at approximate baseline). No reported bleeding   INR today is Supratherapeutic at 5.9. CBC stable - no bleeding noted. Given the large jump up in INR - will hold the warfarin dose tonight as it is expected to continue to trend up.   The patient was  educated on warfarin via phone and the education sheet was placed in the discharge AVS.   Goal of Therapy:  INR 2-3 Monitor platelets by anticoagulation protocol: Yes   Plan:  - Hold Warfarin dose today - Daily PT/INR, CBC q72h - Will continue to monitor for any signs/symptoms of bleeding and will follow up with PT/INR in the a.m.    Ross Bender A. Levada Dy, PharmD, BCPS, FNKF Clinical Pharmacist [place] Please utilize Amion for appropriate phone number to reach the unit pharmacist ([place])

## 2019-03-20 NOTE — Progress Notes (Signed)
Spoke with son [name]. Gave updates. Would like for MD to call if any changes. Cathlean Marseilles Tamala Julian, MSN, RN, [place], AGCNS

## 2019-03-20 NOTE — TOC Progression Note (Addendum)
Transition of Care [facility]) - Progression Note    Patient Details  Name: [name] MRN: [id number] Date of Birth: 11/10/1950  Transition of [facility]) CM/SW Contact  Maryclare Labrador, RN Phone Number: 03/20/2019, 3:24 PM  Clinical Narrative:   Myra Gianotti confirms they have received all requested documentation.  CM informed that "Carla Drape" will follow back up with CM regarding if pt can be accepted back at facility.  Pt was discussed during LOS this am with TOC leadership.    Update:  CM received call back from Dottie with [facility].  Facility would like to reach out to pts son to discuss before deciding on allowing pt to come back to facility.  Per Carla Drape pt was not ambulatory prior to admit and only assisted in transfers.    Expected Discharge Plan: Assisted Living Barriers to Discharge: Continued Medical Work up  Expected Discharge Plan and Services Expected Discharge Plan: Assisted Living       Living arrangements for the past 2 months: Assisted Living Facility                                       Social Determinants of Health (SDOH) Interventions    Readmission Risk Interventions No flowsheet data found.

## 2019-03-20 NOTE — Progress Notes (Signed)
Lowered goal for low BP

## 2019-03-20 NOTE — Progress Notes (Signed)
MEWS/VS Documentation      03/20/2019 0400 03/20/2019 0440 03/20/2019 0450 03/20/2019 0700   MEWS Score:  2  3  1  3    MEWS Score Color:  Yellow  Yellow  Green  Yellow   Resp:  16  13  (!) 21  12   Pulse:  (!) 37  (!) 32  (!) 48  (!) 36   BP:  (!) 124/53  -  -  (!) 113/58   Temp:  97.8 F (36.6 C)  -  -  -    Pt brady with AFIB. Not an acute change for this patient. Care team waiting for infection to clear up to place pace maker

## 2019-03-21 DIAGNOSIS — Z7189 Other specified counseling: Secondary | ICD-10-CM

## 2019-03-21 DIAGNOSIS — Z66 Do not resuscitate: Secondary | ICD-10-CM

## 2019-03-21 DIAGNOSIS — Z515 Encounter for palliative care: Secondary | ICD-10-CM

## 2019-03-21 LAB — RENAL FUNCTION PANEL
Albumin: 2.6 g/dL — ABNORMAL LOW (ref 3.5–5.0)
Anion gap: 16 — ABNORMAL HIGH (ref 5–15)
BUN: 54 mg/dL — ABNORMAL HIGH (ref 8–23)
CO2: 24 mmol/L (ref 22–32)
Calcium: 8.4 mg/dL — ABNORMAL LOW (ref 8.9–10.3)
Chloride: 99 mmol/L (ref 98–111)
Creatinine, Ser: 5.96 mg/dL — ABNORMAL HIGH (ref 0.61–1.24)
GFR calc Af Amer: 10 mL/min — ABNORMAL LOW (ref >60)
GFR calc non Af Amer: 9 mL/min — ABNORMAL LOW (ref >60)
Glucose, Bld: 230 mg/dL — ABNORMAL HIGH (ref 70–99)
Phosphorus: 5.9 mg/dL — ABNORMAL HIGH (ref 2.5–4.6)
Potassium: 4.2 mmol/L (ref 3.5–5.1)
Sodium: 139 mmol/L (ref 135–145)

## 2019-03-21 LAB — CBC WITH DIFFERENTIAL/PLATELET
Abs Immature Granulocytes: 0.15 10*3/uL — ABNORMAL HIGH (ref 0.00–0.07)
Basophils Absolute: 0 10*3/uL (ref 0.0–0.1)
Basophils Relative: 0 %
Eosinophils Absolute: 0 10*3/uL (ref 0.0–0.5)
Eosinophils Relative: 0 %
HCT: 28.6 % — ABNORMAL LOW (ref 39.0–52.0)
Hemoglobin: 9.4 g/dL — ABNORMAL LOW (ref 13.0–17.0)
Immature Granulocytes: 4 %
Lymphocytes Relative: 3 %
Lymphs Abs: 0.1 10*3/uL — ABNORMAL LOW (ref 0.7–4.0)
MCH: 34.7 pg — ABNORMAL HIGH (ref 26.0–34.0)
MCHC: 32.9 g/dL (ref 30.0–36.0)
MCV: 105.5 fL — ABNORMAL HIGH (ref 80.0–100.0)
Monocytes Absolute: 0.2 10*3/uL (ref 0.1–1.0)
Monocytes Relative: 6 %
Neutro Abs: 3.1 10*3/uL (ref 1.7–7.7)
Neutrophils Relative %: 87 %
Platelets: 89 10*3/uL — ABNORMAL LOW (ref 150–400)
RBC: 2.71 MIL/uL — ABNORMAL LOW (ref 4.22–5.81)
RDW: 14 % (ref 11.5–15.5)
WBC: 3.5 10*3/uL — ABNORMAL LOW (ref 4.0–10.5)
nRBC: 0 % (ref 0.0–0.2)

## 2019-03-21 LAB — GLUCOSE, CAPILLARY
Glucose-Capillary: 193 mg/dL — ABNORMAL HIGH (ref 70–99)
Glucose-Capillary: 279 mg/dL — ABNORMAL HIGH (ref 70–99)
Glucose-Capillary: 230 mg/dL — ABNORMAL HIGH (ref 70–99)
Glucose-Capillary: 270 mg/dL — ABNORMAL HIGH (ref 70–99)

## 2019-03-21 LAB — PROTIME-INR
INR: 6.6 (ref 0.8–1.2)
Prothrombin Time: 57.9 seconds — ABNORMAL HIGH (ref 11.4–15.2)

## 2019-03-21 LAB — FERRITIN: Ferritin: 2135 ng/mL — ABNORMAL HIGH (ref 24–336)

## 2019-03-21 LAB — HEMOGLOBIN A1C
Hgb A1c MFr Bld: 6.5 % — ABNORMAL HIGH (ref 4.8–5.6)
Mean Plasma Glucose: 139.85 mg/dL

## 2019-03-21 MED ORDER — PRO-STAT SUGAR FREE PO LIQD
30.0000 mL | Freq: Two times a day (BID) | ORAL | Status: DC
  Administered 2019-03-26: 30 mL via ORAL
  Filled 2019-03-21 (×9): qty 30

## 2019-03-21 MED ORDER — PANTOPRAZOLE SODIUM 40 MG PO TBEC
40.0000 mg | DELAYED_RELEASE_TABLET | Freq: Every day | ORAL | Status: AC
  Administered 2019-03-21 – 2019-03-24 (×4): 40 mg via ORAL
  Filled 2019-03-21 (×5): qty 1

## 2019-03-21 MED ORDER — INSULIN ASPART 100 UNIT/ML ~~LOC~~ SOLN
0.0000 [IU] | Freq: Three times a day (TID) | SUBCUTANEOUS | Status: DC
  Administered 2019-03-21: 5 [IU] via SUBCUTANEOUS
  Administered 2019-03-21: 10:00:00 2 [IU] via SUBCUTANEOUS
  Administered 2019-03-21: 3 [IU] via SUBCUTANEOUS
  Administered 2019-03-22: 1 [IU] via SUBCUTANEOUS
  Administered 2019-03-22: 5 [IU] via SUBCUTANEOUS
  Administered 2019-03-22: 3 [IU] via SUBCUTANEOUS
  Administered 2019-03-23: 5 [IU] via SUBCUTANEOUS
  Administered 2019-03-23 (×2): 9 [IU] via SUBCUTANEOUS
  Administered 2019-03-24: 5 [IU] via SUBCUTANEOUS
  Administered 2019-03-24: 7 [IU] via SUBCUTANEOUS
  Administered 2019-03-24: 3 [IU] via SUBCUTANEOUS
  Administered 2019-03-25: 7 [IU] via SUBCUTANEOUS
  Administered 2019-03-25: 5 [IU] via SUBCUTANEOUS
  Administered 2019-03-26: 3 [IU] via SUBCUTANEOUS
  Administered 2019-03-26: 12:00:00 5 [IU] via SUBCUTANEOUS
  Administered 2019-03-27: 2 [IU] via SUBCUTANEOUS

## 2019-03-21 MED ORDER — INSULIN GLARGINE 100 UNIT/ML ~~LOC~~ SOLN
10.0000 [IU] | Freq: Every day | SUBCUTANEOUS | Status: DC
  Administered 2019-03-21 – 2019-03-28 (×8): 10 [IU] via SUBCUTANEOUS
  Filled 2019-03-21 (×9): qty 0.1

## 2019-03-21 MED ORDER — CHLORHEXIDINE GLUCONATE CLOTH 2 % EX PADS
6.0000 | MEDICATED_PAD | Freq: Every day | CUTANEOUS | Status: DC
  Administered 2019-03-23 – 2019-03-26 (×4): 6 via TOPICAL

## 2019-03-21 MED ORDER — MIDODRINE HCL 5 MG PO TABS
5.0000 mg | ORAL_TABLET | ORAL | Status: DC
  Administered 2019-03-22 – 2019-03-27 (×3): 5 mg via ORAL
  Filled 2019-03-21 (×2): qty 1

## 2019-03-21 NOTE — TOC Progression Note (Addendum)
Transition of Care [facility]) - Progression Note    Patient Details  Name: WLADYSLAW HENRICHS MRN: [id number] Date of Birth: Jul 11, 1950  [facility]) CM/SW Contact  Maryclare Labrador, RN Phone Number: 03/21/2019, 9:32 AM  Clinical Narrative:   CM was informed by Myra Gianotti that pt will need to go to SNF before returning ALF.  CM discussed SNF discharge with pt - explained the process - pt is in agreement with short term rehab.  Pt requested CM contact his son [name] and inform of SNF recommendation - pts son is in agreement.  Both son and pt informed CM that [facility] would not be considered due to a bad experience.   CM informed both son and pt that accepting facilities for COVID and outpt HD centers are limited.  CM informed renal coordiantor that pt will need SNF  FL2 completed and pt is faxed out.     Palliative consulted for Goals of Care  Update:  [facility] rescinded bed offer due to pt needing outpt HD.  [facility] can not take a HD pt, [facility] can not accept covid and HD pt.     Expected Discharge Plan: Assisted Living Barriers to Discharge: Continued Medical Work up  Expected Discharge Plan and Services Expected Discharge Plan: Assisted Living       Living arrangements for the past 2 months: Assisted Living Facility                                       Social Determinants of Health (SDOH) Interventions    Readmission Risk Interventions No flowsheet data found.

## 2019-03-21 NOTE — Progress Notes (Addendum)
Progress Note    [name]  UXL:[id number] DOB: 09-04-1950  DOA: 03/13/2019 PCP: [name], MD      Brief Narrative:    Medical records reviewed and are as summarized below:  [name] is an 68 y.o. male medical history significant for ESRD on hemodialysis, atrial fibrillation on warfarin, chronic hypotension on midodrine, legal blindness. He was brought to the hospital because of generalized weakness and tested positive for corona virus infection.      Assessment/Plan:   Principal Problem:   COVID-19 virus infection Active Problems:   ESRD (end stage renal disease) on dialysis (HCC)   Hypotension of hemodialysis   Thrombocytopenia (HCC)   DM type 2 (diabetes mellitus, type 2) (HCC)   Bilateral blindness   Generalized weakness   Body mass index is 26.84 kg/m.   COVID-19 pneumonia: Completed IV remdesivir on 03/18/2019. IV dexamethasone to be completed on 03/26/2019. Completed IV ceftriaxone and Rocephin.  Significant sinus bradycardia: Asymptomatic. Heart rate had been between 30 and 40s. He was seen by cardiologist the specialist) who said patient is a poor candidate for pacemaker placement.  Chronic hypotension: He is on midodrine  ESRD: Follow-up with nephrologist for hemodialysis  Paroxysmal atrial fibrillation: He is on warfarin for long-term anticoagulation. INR is still supratherapeutic. Hold warfarin for Coumadin coagulopathy and monitor INR. Follow-up with pharmacist for warfarin dosing.  Patient started on Protonix for GI prophylaxis because of high risk for GI bleeding  Thrombocytopenia: Platelet count worsened.  Platelet count has been fluctuating.  Continue to monitor.  Debility: PT and OT.  Follow-up with social worker to assist with placement to SNF.  Of note, patient came from an assisted living facility.  Follow-up with palliative care.   Family Communication/Anticipated D/C date and plan/Code Status   DVT prophylaxis:  Warfarin Code Status: DNR Family Communication: Plan discussed with the patient Disposition Plan: To be determined      Subjective:   No complaints.  No cough, shortness of breath, chest pain or fever.   Objective:    Vitals:   03/21/19 0800 03/21/19 1216 03/21/19 1300 03/21/19 1313  BP: (!) 104/52 (!) 91/55 (!) 85/53 (!) 101/57  Pulse: (!) 52 (!) 51 (!) 42 (!) 49  Resp: 16 13 14 11   Temp:  98.3 F (36.8 C)    TempSrc:  Oral    SpO2: 98% 98% 99% 98%  Weight:      Height:        Intake/Output Summary (Last 24 hours) at 03/21/2019 1340 Last data filed at 03/21/2019 5366 Gross per 24 hour  Intake 120 ml  Output 800 ml  Net -680 ml   Filed Weights   03/16/19 0025 03/16/19 0330 03/20/19 1300  Weight: 111 kg 109.8 kg 100 kg    Exam:   GEN: NAD SKIN: Sacral decubitus ulcer present on admission EYES: Legally blind ENT: MMM CV: RRR PULM: CTA B ABD: soft, ND, NT, +BS CNS: Alert, non focal EXT: No edema or tenderness        Data Reviewed:   I have personally reviewed following labs and imaging studies:  Labs: Labs show the following:   Basic Metabolic Panel: Recent Labs  Lab 03/16/19 0500 03/17/19 0408 03/17/19 1114 03/18/19 0458 03/19/19 0500 03/20/19 0355 03/21/19 0522  NA 144 145  --  143 142 142 139  K 3.8 4.3  --  4.3 3.7 4.4 4.2  CL 101 101  --  100 100 99 99  CO2 25 22  --  21* 22 22 24   GLUCOSE 105* 192*  --  260* 213* 366* 230*  BUN 72* 90*  --  116* 94* 122* 54*  CREATININE 9.09* 10.23*  --  11.35* 9.15* 10.91* 5.96*  CALCIUM 8.3* 8.3*  --  8.6* 8.8* 8.4* 8.4*  MG 2.2  --  2.4 2.5*  --   --   --   PHOS 6.9* 8.7*  --  9.7* 7.0* 8.6* 5.9*   GFR Estimated Creatinine Clearance: 14.4 mL/min (A) (by C-G formula based on SCr of 5.96 mg/dL (H)). Liver Function Tests: Recent Labs  Lab 03/16/19 1235 03/17/19 0408 03/18/19 0458 03/19/19 0500 03/20/19 0355 03/21/19 0522  AST 21 20  --   --   --   --   ALT 18 17  --   --   --   --    ALKPHOS 80 93  --   --   --   --   BILITOT 1.2 0.9  --   --   --   --   PROT 5.0* 5.5*  --   --   --   --   ALBUMIN 2.3* 2.5* 2.5* 2.7* 2.5* 2.6*   No results for input(s): LIPASE, AMYLASE in the last 168 hours. No results for input(s): AMMONIA in the last 168 hours. Coagulation profile Recent Labs  Lab 03/18/19 0458 03/19/19 0500 03/19/19 0955 03/20/19 0355 03/21/19 0522  INR 1.4* 2.8* 3.2* 5.9* 6.6*    CBC: Recent Labs  Lab 03/17/19 0408 03/17/19 1208 03/18/19 0458 03/19/19 0500 03/20/19 0355 03/21/19 0522  WBC 1.2*  --  3.6* 5.4 5.4 3.5*  NEUTROABS 1.0*  --  3.1 4.9 4.8 3.1  HGB 9.1*  --  9.4* 10.0* 9.6* 9.4*  HCT 27.9* 27.7* 28.0* 29.5* 29.0* 28.6*  MCV 107.7*  --  106.5* 105.0* 107.4* 105.5*  PLT 88*  --  114* 129* 123* 89*   Cardiac Enzymes: No results for input(s): CKTOTAL, CKMB, CKMBINDEX, TROPONINI in the last 168 hours. BNP (last 3 results) No results for input(s): PROBNP in the last 8760 hours. CBG: Recent Labs  Lab 03/15/19 2337 03/16/19 0022 03/19/19 0209 03/21/19 0936 03/21/19 1212  GLUCAP 66* 165* 232* 193* 279*   D-Dimer: Recent Labs    03/19/19 0500  DDIMER 2.98*   Hgb A1c: Recent Labs    03/21/19 0801  HGBA1C 6.5*   Lipid Profile: No results for input(s): CHOL, HDL, LDLCALC, TRIG, CHOLHDL, LDLDIRECT in the last 72 hours. Thyroid function studies: No results for input(s): TSH, T4TOTAL, T3FREE, THYROIDAB in the last 72 hours.  Invalid input(s): FREET3 Anemia work up: Recent Labs    03/20/19 0355 03/21/19 0522  FERRITIN 2,536* 2,135*   Sepsis Labs: Recent Labs  Lab 03/18/19 0458 03/19/19 0500 03/20/19 0355 03/21/19 0522  WBC 3.6* 5.4 5.4 3.5*    Microbiology Recent Results (from the past 240 hour(s))  Culture, blood (routine x 2)     Status: None   Collection Time: 03/14/19  8:57 AM   Specimen: BLOOD  Result Value Ref Range Status   Specimen Description   Final    BLOOD RIGHT ANTECUBITAL Performed at [facility], [place] [address], [place], [place] [address]    Special Requests   Final    BOTTLES DRAWN AEROBIC AND ANAEROBIC Blood Culture adequate volume Performed at [place] [address], [place], [place] [address]    Culture   Final  NO GROWTH 5 DAYS Performed at [facility], [place] [address], [place], [place] [address]    Report Status 03/19/2019 FINAL  Final  Culture, blood (routine x 2)     Status: None   Collection Time: 03/14/19  8:58 AM   Specimen: BLOOD  Result Value Ref Range Status   Specimen Description   Final    BLOOD BLOOD RIGHT FOREARM Performed at [facility] [address] [place], [place] [address]    Special Requests   Final    BOTTLES DRAWN AEROBIC AND ANAEROBIC Blood Culture adequate volume Performed at [facility] [address], [place], [place] [address]    Culture   Final    NO GROWTH 5 DAYS Performed at [facility], [place] [address], [place], [place] [address]    Report Status 03/19/2019 FINAL  Final  MRSA PCR Screening     Status: None   Collection Time: 03/15/19  3:08 AM   Specimen: Nasopharyngeal  Result Value Ref Range Status   MRSA by PCR NEGATIVE NEGATIVE Final    Comment:        The GeneXpert MRSA Assay (FDA approved for NASAL specimens only), is one component of a comprehensive MRSA colonization surveillance program. It is not intended to diagnose MRSA infection nor to guide or monitor treatment for MRSA infections. Performed at [facility], [place] [address], [place], [place] [address]   SARS CORONAVIRUS 2 (TAT 6-24 HRS) Nasopharyngeal Nasopharyngeal Swab     Status: Abnormal   Collection Time: 03/15/19  6:01 AM   Specimen: Nasopharyngeal Swab  Result Value Ref Range Status   SARS Coronavirus 2 POSITIVE (A) NEGATIVE Final    Comment: RESULT CALLED TO, READ BACK BY AND VERIFIED WITH: RN JOHN BRIDGES [phone] FCP (NOTE) SARS-CoV-2  target nucleic acids are DETECTED. The SARS-CoV-2 RNA is generally detectable in upper and lower respiratory specimens during the acute phase of infection. Positive results are indicative of the presence of SARS-CoV-2 RNA. Clinical correlation with patient history and other diagnostic information is  necessary to determine patient infection status. Positive results do not rule out bacterial infection or co-infection with other viruses.  The expected result is Negative. Fact Sheet for Patients: SugarRoll.be Fact Sheet for Healthcare Providers: https://www.woods-mathews.com/ This test is not yet approved or cleared by the [place] FDA and  has been authorized for detection and/or diagnosis of SARS-CoV-2 by FDA under an Emergency Use Authorization (EUA). This EUA will remain  in effect (meaning this test can be used) for the dur ation of the COVID-19 declaration under Section 564(b)(1) of the Act, 21 U.S.C. section 360bbb-3(b)(1), unless the authorization is terminated or revoked sooner. Performed at [facility], [place] [address] [place], [place] [address]     Procedures and diagnostic studies:  No results found.  Medications:   . albuterol  2 puff Inhalation Q6H  . atorvastatin  10 mg Oral Daily  . Chlorhexidine Gluconate Cloth  6 each Topical Q0600  . Chlorhexidine Gluconate Cloth  6 each Topical Q0600  . dexamethasone (DECADRON) injection  6 mg Intravenous Q24H  . feeding supplement (PRO-STAT SUGAR FREE 64)  30 mL Oral BID  . insulin aspart  0-9 Units Subcutaneous TID WC  . insulin glargine  10 Units Subcutaneous Daily  . mouth rinse  15 mL Mouth Rinse BID  . multivitamin  1 tablet Oral QHS  . pantoprazole  40 mg Oral Daily  .  sevelamer carbonate  4,800 mg Oral TID WC  . Warfarin - Pharmacist Dosing Inpatient   Does not apply q1800   Continuous Infusions: . sodium chloride 1,000 mL (03/14/19 0851)     LOS: 7 days    Kjersten Ormiston  [facility]   *Please refer to [email or web address], password [email or web address] to get updated schedule on who will round on this patient, as hospitalists switch teams weekly. If 7PM-7AM, please contact night-coverage at [email or web address], password [email or web address] for any overnight needs.  03/21/2019, 1:40 PM

## 2019-03-21 NOTE — Progress Notes (Signed)
[facility] Progress Note  Subjective:   Examined in person with appropriate PPE.  HD on 12/16 with 800 mL UF.  He has continued with bradycardia and per EP is a poor candidate for pacemaker. Not palliative consulted.  Has been off of the midodrine.  Has been on room air  Review of systems per nursing  Denies shortness of breath  Denies nausea or vomiting No chest pain   Vitals:   03/21/19 0800 03/21/19 1216 03/21/19 1300 03/21/19 1313  BP: (!) 104/52 (!) 91/55 (!) 85/53 (!) 101/57  Pulse: (!) 52 (!) 51 (!) 42 (!) 49  Resp: 16 13 14 11   Temp:  98.3 F (36.8 C)    TempSrc:  Oral    SpO2: 98% 98% 99% 98%  Weight:      Height:        Inpatient medications: . albuterol  2 puff Inhalation Q6H  . atorvastatin  10 mg Oral Daily  . Chlorhexidine Gluconate Cloth  6 each Topical Q0600  . Chlorhexidine Gluconate Cloth  6 each Topical Q0600  . dexamethasone (DECADRON) injection  6 mg Intravenous Q24H  . feeding supplement (PRO-STAT SUGAR FREE 64)  30 mL Oral BID  . insulin aspart  0-9 Units Subcutaneous TID WC  . insulin glargine  10 Units Subcutaneous Daily  . mouth rinse  15 mL Mouth Rinse BID  . multivitamin  1 tablet Oral QHS  . pantoprazole  40 mg Oral Daily  . sevelamer carbonate  4,800 mg Oral TID WC  . Warfarin - Pharmacist Dosing Inpatient   Does not apply q1800   . sodium chloride 1,000 mL (03/14/19 0851)   sodium chloride, acetaminophen, cyclobenzaprine, ondansetron **OR** ondansetron (ZOFRAN) IV, sevelamer carbonate    Exam:  Adult male in bed in no acute distress at rest  HEENT normocephalic atraumatic Lungs are clear but reduced normal work of breathing on room air Heart Bradycardic with heart rate 50 on exam abd soft nt/nd Neuro awake and conversant Psych normal mood and affect on exam today Access left upper extremity AV fistula with bruit and thrill   Home meds:  - midodrine 10 tid/ sevelamer carb ac tid  - lipitor 10/  aspirin 81  -  januvia 25 mg qd  - prn's/ vitamins/ supplements  - pantoprazole 40    Outpt HD: MWF [facility]  4h   450/800  98kg  2/2 bath  AVF  Hep none  - parsabiv 7.5mg  tiw   - low UF 0.2, 0.8 recently    CXR 12/10 - no edema, RLL infiltrate   Assessment/ Plan: 1. COVID +PNA: admit 12/10, not hypoxic, CXR +infiltrate, low wbc, no fever.  Admitted 03/14/19.  abx and decadron per primary team.  s/p remdesivir  2. Chronic hypotension - BP's runs 70's -90's at OP HD, on midodrine 10 tid outpt -> was decreased to 5 mg TID with bradycardia and ultimately stopped to determine if impacting bradycardia 3. ESRD - on HD per Monday Wednesday Friday schedule.  Treatment time 4 hours as inpatient volumes allow for clearance. Daily weights ordered  4. Bradycardia - note cardiology was consulted; poor candidate for pacemaker.  Not on AV nodal agents; discontinued midodrine for now to determine if effect   5. DM2 per primary team  6. Anemia ckd -no ESA in outpatient orders; aranesp 40 mcg once 12/15  7. MBD ckd - continue binders (not compliant here); hyperphos improving renal diet  8. EOL - multiple comorbidities, pt is  now DNR.    Claudia Desanctis 03/21/2019 1:45 PM

## 2019-03-21 NOTE — Progress Notes (Signed)
ANTICOAGULATION CONSULT NOTE - Follow-Up  Pharmacy Consult for Warfarin Indication: atrial fibrillation  Patient Measurements: Height: 6\' 4"  (193 cm) Weight: 220 lb 7.4 oz (100 kg) IBW/kg (Calculated) : 86.8  Vital Signs: Temp: 98 F (36.7 C) (12/17 0604) Temp Source: Oral (12/17 0604) BP: 113/60 (12/17 0604) Pulse Rate: 46 (12/17 0550)  Labs: Recent Labs    03/19/19 0500 03/19/19 0955 03/20/19 0355 03/21/19 0522  HGB 10.0*  --  9.6* 9.4*  HCT 29.5*  --  29.0* 28.6*  PLT 129*  --  123* PENDING  LABPROT 29.6* 32.7* 52.9* 57.9*  INR 2.8* 3.2* 5.9* 6.6*  CREATININE 9.15*  --  10.91* 5.96*    Estimated Creatinine Clearance: 14.4 mL/min (A) (by C-G formula based on SCr of 5.96 mg/dL (H)).   Medical History: Past Medical History:  Diagnosis Date  . Anemia   . Arthritis    "back, neck" (07/28/2014)  . Blind    both eyes removed   . Bruises easily   . CAD, NATIVE VESSEL 01/08/2008      . Cellulitis late 1980's   "hospitalized; wrapped both legs; several times; no OR for this"  . Colon polyps   . Diabetic neuropathy ([place])   . Dialysis patient ([place])   . DM type 2 (diabetes mellitus, type 2) ([place])    no medications (07/28/2014) diet and excersie controlled not onmeds for 14-15 years   . Dysrhythmia    afib   . ESRD (end stage renal disease) on dialysis [facility])    [name]; Mon, Wed, Fri (07/28/2014)  . Fall    hx of fall and required left hip surgery   . Family history of breast cancer    mother  . Heart murmur    hx of  . History of blood product transfusion   . HYPERLIPIDEMIA-MIXED 01/08/2008  . HYPERTENSION 01/27/2009   BP low , hx of HTN, Currently on meds to raise BP  . Hypotension   . Kidney stones   . Low blood pressure   . OVERWEIGHT/OBESITY 01/08/2008   Lost 205 lbs through diet and exercise.    . Peripheral vascular disease ([place])    vein stripping  . Pneumonia 2000;s X 1  . Prostate cancer ([place])   . Prosthetic eye globe    both eyes  . Renal  cell carcinoma 2001 and 2003   "both kidneys"  . Renal insufficiency      Assessment: 68 yo male presented on 03/13/2019 with weakness found to be positive for COVID-19. Patient has a history of slow Afib and was evaluated by cardiology on 02/15/2018 and recommended to begin anticoagulation but the patient did not follow up. Pharmacy has been consulted to dose warfarin for Afib. CHAD2-VASc score of 3. Patient is currently in AFib with HR in 65s. No reported bleeding   INR today is Supratherapeutic at 5.9>>6.6. Hgb trending down to 9.4 - no bleeding noted. Given the large jump up in INR - will hold the warfarin dose tonight.  The patient was educated on warfarin via phone and the education sheet was placed in the discharge AVS.   Goal of Therapy:  INR 2-3 Monitor platelets by anticoagulation protocol: Yes   Plan:  - Hold Warfarin dose today - Daily PT/INR, CBC q72h - Will continue to monitor for any signs/symptoms of bleeding and will follow up with PT/INR in the a.m.    Anthony Tamburo A. Levada Dy, PharmD, BCPS, FNKF Clinical Pharmacist [place] Please utilize Amion for appropriate  phone number to reach the unit pharmacist ([place])

## 2019-03-21 NOTE — NC FL2 (Addendum)
[place] LEVEL OF CARE SCREENING TOOL     IDENTIFICATION  Patient Name: [name] Birthdate: [birth year] Sex: male Admission Date (Current Location): 03/13/2019  [facility] and [place] Number:  Herbalist and Address:  [facility], [place] [address] [place], [place] [address]      Provider Number: [id number]  Attending Physician Name and Address:  Jennye Boroughs, MD  Relative Name and Phone Number:  Macsen Nuttall [phone]    Current Level of Care: Hospital Recommended Level of Care: [place] Prior Approval Number:    Date Approved/Denied:   PASRR Number: [id number]  Discharge Plan: SNF    Current Diagnoses: Patient Active Problem List   Diagnosis Date Noted  . COVID-19 virus infection 03/14/2019  . Generalized weakness 03/14/2019  . 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 Pressure injury of skin 03/21/2018  . Other pancytopenia ([place]) 03/17/2018  . CAP (community acquired pneumonia) 03/16/2018  . HCAP (healthcare-associated pneumonia) 03/16/2018  . Other persistent atrial fibrillation ([place]) 02/15/2018  . Renal cell cancer, right ([place]) 04/19/2017  . Macrocytic anemia 04/11/2016  . Diarrhea 04/11/2016  . Metastatic renal cell carcinoma ([place]) 04/07/2016  . Hx of exploratory laparotomy 04/07/2016  . Shock circulatory ([place]) 04/07/2016  . Pre-operative cardiovascular examination 02/19/2016  . Bacteremia   . Abnormal CT of the abdomen   . Abdominal pain 11/01/2015  . UTI (lower urinary tract infection) 11/01/2015  . Sepsis ([place]) 11/01/2015  . Bilateral blindness 06/16/2015  . Carcinoma of kidney ([place]) 06/16/2015  . Type 2 diabetes mellitus ([place]) 06/16/2015  . Intra-abdominal infection 05/11/2015  . Chronic anemia 05/11/2015  . Infection in abdomen ([place]) 05/11/2015  . Second degree AV block, Mobitz type I 02/15/2015  . Atrioventricular block,  Mobitz type 1, Wenckebach 02/15/2015  . Hypotension 02/14/2015  . Hypoglycemia 02/13/2015  . DM type 2, goal A1c below 7 02/13/2015  . Blind   . DM type 2 (diabetes mellitus, type 2) ([place])   . Clear cell renal cell carcinoma (HCC)   . CAD in native artery 09/10/2014  . Renal carcinoma ([place])   . Non-intractable cyclical vomiting with nausea   . Hydronephrosis 07/28/2014  . Coccyx pain 01/03/2014  . Thrombocytopenia ([place]) 01/03/2014  . Intertrochanteric fracture of left hip ([place]) 12/30/2013  . Dialysis patient ([place]) 12/30/2013  . Hip fracture ([place]) 12/30/2013  . Dependence on renal dialysis ([place]) 12/30/2013  . Hypotension of hemodialysis 06/19/2013  . Malignant neoplasm of prostate ([place]) 01/30/2012  . Diabetes mellitus ([place]) 08/30/2011  . Peripheral vascular disease ([place]) 05/27/2009  . COLONIC POLYPS 02/12/2009  . ANEMIA 01/27/2009  . GLAUCOMA 01/27/2009  . Essential hypertension 01/27/2009  . ESRD (end stage renal disease) on dialysis ([place]) 01/27/2009  . End stage renal failure on dialysis ([place]) 01/27/2009  . DIAB W/UNS COMP TYPE II/UNS NOT STATED UNCNTRL 01/08/2008  . HYPERLIPIDEMIA-MIXED 01/08/2008  . OVERWEIGHT/OBESITY 01/08/2008  . DEPRESSIVE DISORDER NOT ELSEWHERE CLASSIFIED 01/08/2008  . Hx of CABG-2013 01/08/2008  . EDEMA 01/08/2008  . Clinical depression 01/08/2008  . HLD (hyperlipidemia) 01/08/2008    Orientation RESPIRATION BLADDER Height & Weight     Self, Time, Situation, Place  Normal Continent Weight: 100 kg Height:  6\' 4"  (193 cm)  BEHAVIORAL SYMPTOMS/MOOD NEUROLOGICAL BOWEL NUTRITION STATUS      Incontinent Diet(renal with fluid restriction 1222ml)  AMBULATORY STATUS COMMUNICATION OF NEEDS Skin   Limited Assist(Moderate plus 2 Assist per PT  notes) Verbally Other (Comment), Surgical wounds, Skin abrasions(Fistula Left Upper Arm, right toe amputation, skin tear on right arm and knee, moisture skin noted on sacrum)                       Personal Care  Assistance Level of Assistance  Bathing, Dressing, Feeding Bathing Assistance: Maximum assistance Feeding assistance: Limited assistance Dressing Assistance: Maximum assistance     Functional Limitations Info  Sight(Pt is blind in both eye) Sight Info: Impaired        SPECIAL CARE FACTORS FREQUENCY  PT (By licensed PT), OT (By licensed OT)     PT Frequency: PT x 5 OT Frequency: OT x 5            Contractures Contractures Info: Not present(non documented)    Additional Factors Info  Code Status, Allergies, Isolation Precautions, Insulin Sliding Scale Code Status Info: DNR Allergies Info: Codeine, tape   Insulin Sliding Scale Info: insulin aspart (novoLOG) injection 0-9 Units, Lantus Isolation Precautions Info: COVID     Current Medications (03/21/2019):  This is the current hospital active medication list Current Facility-Administered Medications  Medication Dose Route Frequency Provider Last Rate Last Admin  . 0.9 %  sodium chloride infusion   Intravenous PRN Cherylann Ratel A, DO 5 mL/hr at 03/14/19 0851 1,000 mL at 03/14/19 0851  . acetaminophen (TYLENOL) tablet 650 mg  650 mg Oral Q6H PRN Etta Quill, DO   650 mg at 03/19/19 0232  . albuterol (VENTOLIN HFA) 108 (90 Base) MCG/ACT inhaler 2 puff  2 puff Inhalation Q6H Etta Quill, DO   2 puff at 03/21/19 [phone]  . atorvastatin (LIPITOR) tablet 10 mg  10 mg Oral Daily Jennette Kettle M, DO   10 mg at 03/21/19 1093  . Chlorhexidine Gluconate Cloth 2 % PADS 6 each  6 each Topical Q0600 Roney Jaffe, MD   6 each at 03/21/19 0554  . Chlorhexidine Gluconate Cloth 2 % PADS 6 each  6 each Topical Q0600 Claudia Desanctis, MD   6 each at 03/21/19 [phone]  . cyclobenzaprine (FLEXERIL) tablet 5 mg  5 mg Oral TID PRN Marylyn Ishihara, Tyrone A, DO   5 mg at 03/19/19 0232  . dexamethasone (DECADRON) injection 6 mg  6 mg Intravenous Q24H Kyle, Tyrone A, DO   6 mg at 03/20/19 1731  . insulin aspart (novoLOG) injection 0-9 Units  0-9 Units  Subcutaneous TID WC Jennye Boroughs, MD   2 Units at 03/21/19 0957  . insulin glargine (LANTUS) injection 10 Units  10 Units Subcutaneous Daily Jennye Boroughs, MD   10 Units at 03/21/19 [phone]  . MEDLINE mouth rinse  15 mL Mouth Rinse BID Alma Friendly, MD   15 mL at 03/21/19 0957  . multivitamin (RENA-VIT) tablet 1 tablet  1 tablet Oral QHS Kyle, Tyrone A, DO   1 tablet at 03/20/19 2138  . ondansetron (ZOFRAN) tablet 4 mg  4 mg Oral Q6H PRN Etta Quill, DO       Or  . ondansetron [facility]) injection 4 mg  4 mg Intravenous Q6H PRN Etta Quill, DO      . pantoprazole (PROTONIX) EC tablet 40 mg  40 mg Oral Daily Jennye Boroughs, MD   40 mg at 03/21/19 0956  . sevelamer carbonate (RENVELA) tablet 2,400 mg  2,400 mg Oral TID PRN Marylyn Ishihara, Tyrone A, DO      . sevelamer carbonate (RENVELA) tablet 4,800 mg  4,800  mg Oral TID WC Jennette Kettle M, DO   4,800 mg at 03/21/19 5027  . Warfarin - Pharmacist Dosing Inpatient   Does not apply q1800 Henri Medal [place] at 03/19/19 1800     Discharge Medications: Please see discharge summary for a list of discharge medications.  Relevant Imaging Results:  Relevant Lab Results:   Additional Information Pts social security number [id number].  Pt is ESRD on outpatient HHD TTS 12pm - he is set up at [facility], Abelino Derrick, RN

## 2019-03-21 NOTE — Consult Note (Signed)
Consultation Note Date: 03/21/2019   Patient Name: [name]  DOB: May 22, 1950  MRN: [id number]  Age / Sex: 68 y.o., male   PCP: Nicholas Redwood, MD Referring Physician: Jennye Boroughs, MD   REASON FOR CONSULTATION:Establishing goals of care  Palliative Care consult requested for goals of care in this 68 y.o. male with multiple medical problems including ESRD on dialysis TTS, legally blind, atrial fibrillation (warfarin), chronic hypotension (midodrine), CABG (2013), peripheral vascular disease, glaucoma, bilateral renal cell cancer s/p ablation and right radical nephrectomy (01/2015) and thrombocytopenia. [name] presented to ED with complaints of generalized weakness and positive COVID-19. It was reported he fell the day prior to admission. During his ED work up patient was found to be hypotensive (systolic in the 25-36U), however patient reported to provider this is his normal. Since admission patient has completed IV remdesivir and dexamethasone for COVID-19 pneumonia. He is being followed by Nephrology and receiving dialysis.   Clinical Assessment and Goals of Care: I have reviewed medical records including lab results, imaging, Epic notes, and MAR, received report from the bedside RN.  I spoke with patient via phone to discuss diagnosis prognosis, [name], EOL wishes, disposition and options. [name] is awake, alert, and oriented x3. Complains of generalized pain. RN assisted with set-up for discussion via phone. I offered to include his son in discussion, however patient expressed he was capable of having conversations and making some decisions if needed. Pleasant in stating if he felt later in discussion son would be needed he would let me know.   I introduced Palliative Medicine as specialized medical care for people living with serious illness. Patient reluctant in discussion initially, inquiring if I was with hospice. I further educated patient on role of Palliative and our  role in his care while hospitalized. Emphasizing palliative focuses on providing relief from the symptoms and stress of a serious illness. The goal is to improve quality of life for both the patient and the family. He verbalized understanding.   We discussed a brief life review of the patient, along with his functional and nutritional status. 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 He reports his wife passed away a little over a year ago, warranting him to have to be placed at a facility. He has 2 sons ([name]-in [name] and [name]).   Patient reports he has been a resident at [name] for almost a year now. Prior to admission he was mainly wheelchair bound due to significant RA. He reports he has been in a wheelchair for at least 9-10 years. At one point he was able to use a rollator however, he states his walking significantly has worsened and caused him to suffer from falls. He is able to transfer with assistance. He reports being able to feed himself with set-up assistance.   We discussed His current illness and what it means in the larger context of His on-going co-morbidities. With specific discussions regarding his COVID-19 virus, ESRD, hypotension, and overall functional decline.  Natural disease trajectory and expectations at EOL were discussed.  [name] was able to appropriately summarize his current illness and plan of care. He verbalized understanding of that he is not a candidate for cardiac interventions such as pacemaker. He reports he is worried about his health as it has not been the best over the past year and over the past 2-3 weeks it has seem to have gotten worst. We discuss concerns regarding his hypotension  and bradycardia with limited interventions to offer and how this impacts dialysis at times. He verbalized understanding. He reports he has been on HD for almost 8.5 years and not ready to stop unless he is told he does not have an option.   He shares  that he feels as though his entire body is weak and that having COVID has caused his RA to get worst. He reports he can not stand for longer than a minute if that, can't get himself to the bathroom, or have the energy to do those things. He reports he has not learned the layout of halls at [facility] and because of this he feels his mobility has suffered due to lack of use.   I attempted to elicit values and goals of care important to the patient.    The difference between aggressive medical intervention and comfort care was considered in light of the patient's goals of care.  [name] reports "I want to get better or try to get better, but I am not sure what is going to happen!" I used this opportunity to encourage him to elaborate on his statement. He reports he wants to get out of the hospital and back to [facility], he knows he needs rehab, but also worried if he can be "rehabable or get much better!" Therapeutic listening and support given.   There is a noticeable period of silence in his conversation. When inquiring on if he was ok and how was he feeling, he responded "I don't or can't see me getting much more worst. This has to be it right here. I can't walk, I can't do much!" Support given. He expresses his decline in quality of life, sharing that he cannot go on "like this" much more longer. He again expresses hopes for some improvement but with some reluctance that it will happen.   I used this opportunity to discuss best case and worst case scenario. He again confirms hopes for some improvement with preparations for the worst (no improvement or further decline).   Advanced directives, concepts specific to code status, artifical feeding and hydration, and rehospitalization were considered and discussed. He confirms wishes for DNR/DNI. He would not want any forms of artificial feeding if unable to take in food or drink by mouth. Patient reports he does have a documented advanced directive. He  reports his son [name] is his POA.   Hospice and Palliative Care services outpatient were explained and offered. Patient and family verbalized their understanding and awareness of both palliative and hospice's goals and philosophy of care. Mr. Enck reports his is aware of what hospice is and knows at some point he will need them. He is not ready to accept that currently but is in agreement with outpatient palliative support.   Questions and concerns were addressed.  Patient was encouraged to call with questions or concerns.  PMT will continue to support holistically.   SOCIAL HISTORY:     reports that he has never smoked. He has never used smokeless tobacco. He reports that he does not drink alcohol or use drugs.  CODE STATUS: DNR  ADVANCE DIRECTIVES: Rogue Jury (son/POA)   SYMPTOM MANAGEMENT: Per attending   Palliative Prophylaxis:   Aspiration, Bowel Regimen, Frequent Pain Assessment, Oral Care, Palliative Wound Care and Turn Reposition  PSYCHO-SOCIAL/SPIRITUAL:  Support System: Family   Desire for further Chaplaincy support:NO   Additional Recommendations (Limitations, Scope, Preferences):  Full Scope Treatment, No Artificial Feeding and treat the treatable  PAST MEDICAL HISTORY: Past Medical History:  Diagnosis Date  . Anemia   . Arthritis    "back, neck" (07/28/2014)  . Blind    both eyes removed   . Bruises easily   . CAD, NATIVE VESSEL 01/08/2008      . Cellulitis late 1980's   "hospitalized; wrapped both legs; several times; no OR for this"  . Colon polyps   . Diabetic neuropathy ([place])   . Dialysis patient ([place])   . DM type 2 (diabetes mellitus, type 2) ([place])    no medications (07/28/2014) diet and excersie controlled not onmeds for 14-15 years   . Dysrhythmia    afib   . ESRD (end stage renal disease) on dialysis [facility])    [name]; Mon, Wed, Fri (07/28/2014)  . Fall    hx of fall and required left hip surgery   . Family history of breast  cancer    mother  . Heart murmur    hx of  . History of blood product transfusion   . HYPERLIPIDEMIA-MIXED 01/08/2008  . HYPERTENSION 01/27/2009   BP low , hx of HTN, Currently on meds to raise BP  . Hypotension   . Kidney stones   . Low blood pressure   . OVERWEIGHT/OBESITY 01/08/2008   Lost 205 lbs through diet and exercise.    . Peripheral vascular disease ([place])    vein stripping  . Pneumonia 2000;s X 1  . Prostate cancer ([place])   . Prosthetic eye globe    both eyes  . Renal cell carcinoma 2001 and 2003   "both kidneys"  . Renal insufficiency     PAST SURGICAL HISTORY:  Past Surgical History:  Procedure Laterality Date  . AV FISTULA PLACEMENT Left 02/2010   LFA  . AV FISTULA PLACEMENT Left 01/12/2016   Procedure: LEFT BRACHIOCEPHALIC ARTERIOVENOUS (AV) FISTULA CREATION;  Surgeon: Angelia Mould, MD;  Location: [place];  Service: Vascular;  Laterality: Left;  . CHOLECYSTECTOMY OPEN  1992  . COLONOSCOPY  06/14/2011   Procedure: COLONOSCOPY;  Surgeon: [name], MD;  [facility];  Service: Endoscopy;  Laterality: N/A;  . COLONOSCOPY N/A 07/24/2012   Procedure: COLONOSCOPY;  Surgeon: [name], MD;  [facility];  Service: Endoscopy;  Laterality: N/A;  . COLONOSCOPY    . COLONOSCOPY N/A 11/04/2015   Procedure: COLONOSCOPY;  Surgeon: Manus Gunning, MD;  [facility] END[facility]COPY;  Service: Gastroenterology;  Laterality: N/A;  . COLONOSCOPY WITH PROPOFOL N/A 05/13/2014   Procedure: COLONOSCOPY WITH PROPOFOL;  Surgeon: [name], MD;  [facility];  Service: Endoscopy;  Laterality: N/A;  . CORONARY ARTERY BYPASS GRAFT  09/29/2011   Procedure: CORONARY ARTERY BYPASS GRAFTING (CABG);  Surgeon: [name], MD;  Location: [place];  Service: Open Heart Surgery;  Laterality: N/A;  Coronary Artery Bypass Graft times two utilizing the left internal mammary artery and the right greater saphenous vein harvested endoscopically.  .  CYSTOSCOPY WITH RETROGRADE PYELOGRAM, URETEROSCOPY AND STENT PLACEMENT Left 07/28/2014   Procedure: CYSTOSCOPY WITH RETROGRADE PYELOGRAM, URETERAL BALLOON DILITATION, URETEROSCOPY AND LEFT STENT PLACEMENT;  Surgeon: [name], MD;  [facility];  Service: Urology;  Laterality: Left;  . ENUCLEATION  2003; 2006   bilateral; "diabetes; pain"  . FOOT AMPUTATION Right ~ 2002   right;  partial; "infection"  . FRACTURE SURGERY     hip fx  . HIP PINNING,CANNULATED Left 12/31/2013   Procedure: CANNULATED HIP PINNING;  Surgeon: Renette Butters,  MD;  Location: [place];  Service: Orthopedics;  Laterality: Left;  Carm, FX Table, [employer]  . HOT HEMOSTASIS  06/14/2011   Procedure: HOT HEMOSTASIS (ARGON PLASMA COAGULATION/BICAP);  Surgeon: [name], MD;  [facility];  Service: Endoscopy;  Laterality: N/A;  . INSERTION OF DIALYSIS CATHETER Right 01/12/2016   Procedure: INSERTION OF DIALYSIS CATHETER;  Surgeon: Angelia Mould, MD;  Location: [place];  Service: Vascular;  Laterality: Right;  . INSERTION PROSTATE RADIATION SEED  03/2012  . IR GENERIC HISTORICAL  01/13/2016   IR RADIOLOGIST EVAL & MGMT 01/13/2016 Aletta Edouard, MD [facility] INTERV RAD  . IR RADIOLOGIST EVAL & MGMT  03/02/2017  . IR RADIOLOGIST EVAL & MGMT  05/18/2017  . IR RADIOLOGIST EVAL & MGMT  07/27/2017  . LAPAROTOMY N/A 04/07/2016   Procedure: EXPLORATORY LAPAROTOMY AND RESECTION OF RETROPERITONEAL MASS;  Surgeon: Raynelle Bring, MD;  [facility];  Service: Urology;  Laterality: N/A;  . LEFT HEART CATHETERIZATION WITH CORONARY ANGIOGRAM N/A 09/27/2011   Procedure: LEFT HEART CATHETERIZATION WITH CORONARY ANGIOGRAM;  Surgeon: [name], MD;  Location: [facility] CATH LAB;  Service: Cardiovascular;  Laterality: N/A;  . LIGATION OF ARTERIOVENOUS  FISTULA Left 01/12/2016   Procedure: LIGATION OF LEFT RADIOCEPHALIC ARTERIOVENOUS  FISTULA;  Surgeon: Angelia Mould, MD;  Location: [place];  Service: Vascular;  Laterality:  Left;  . LIGATION OF COMPETING BRANCHES OF ARTERIOVENOUS FISTULA Left 07/29/2014   Procedure: LIGATION OF COMPETING BRANCHES OF LEFT ARM ARTERIOVENOUS FISTULA;  Surgeon: [name], MD;  Location: [place];  Service: Vascular;  Laterality: Left;  . NEPHRECTOMY Right 01/30/2015   done at [place]  . PARTIAL COLECTOMY Right 04/07/2016   Procedure: RIGHT COLECTOMY AND PARTIAL LIVER RESECTION;  Surgeon: Raynelle Bring, MD;  [facility];  Service: Urology;  Laterality: Right;  . RADIOFREQUENCY ABLATION N/A 04/19/2017   Procedure: CT MICROWAVE THERMAL ABLATION;  Surgeon: Aletta Edouard, MD;  [facility];  Service: Anesthesiology;  Laterality: N/A;  . RADIOFREQUENCY ABLATION KIDNEY  2010-2012   "twice; one on each side; for cancer"  . REVISON OF ARTERIOVENOUS FISTULA Left 09/01/2015   Procedure: EXPLORATION LEFT LOWER ARM  RADIOCEPHALIC ARTERIOVENOUS FISTULA;  Surgeon: Conrad Albion, MD;  Location: [place];  Service: Vascular;  Laterality: Left;  [name] VARICOSE VEIN SURGERY  mid 1980's    BLE; "knees down; both legs; 2 separate times"    ALLERGIES:  is allergic to codeine and tape.   MEDICATIONS:  Current Facility-Administered Medications  Medication Dose Route Frequency Provider Last Rate Last Admin  . 0.9 %  sodium chloride infusion   Intravenous PRN Cherylann Ratel A, DO 5 mL/hr at 03/14/19 0851 1,000 mL at 03/14/19 0851  . acetaminophen (TYLENOL) tablet 650 mg  650 mg Oral Q6H PRN Etta Quill, DO   650 mg at 03/19/19 0232  . albuterol (VENTOLIN HFA) 108 (90 Base) MCG/ACT inhaler 2 puff  2 puff Inhalation Q6H Etta Quill, DO   2 puff at 03/21/19 [phone]  . atorvastatin (LIPITOR) tablet 10 mg  10 mg Oral Daily Jennette Kettle M, DO   10 mg at 03/21/19 4580  . Chlorhexidine Gluconate Cloth 2 % PADS 6 each  6 each Topical Q0600 Roney Jaffe, MD   6 each at 03/21/19 0554  . Chlorhexidine Gluconate Cloth 2 % PADS 6 each  6 each Topical Q0600 Claudia Desanctis, MD   6 each at 03/21/19 [phone]    . cyclobenzaprine (FLEXERIL) tablet  5 mg  5 mg Oral TID PRN Cherylann Ratel A, DO   5 mg at 03/19/19 0232  . dexamethasone (DECADRON) injection 6 mg  6 mg Intravenous Q24H Kyle, Tyrone A, DO   6 mg at 03/20/19 1731  . feeding supplement (PRO-STAT SUGAR FREE 64) liquid 30 mL  30 mL Oral BID Nicholas Boroughs, MD      . insulin aspart (novoLOG) injection 0-9 Units  0-9 Units Subcutaneous TID WC Nicholas Boroughs, MD   2 Units at 03/21/19 0957  . insulin glargine (LANTUS) injection 10 Units  10 Units Subcutaneous Daily Nicholas Boroughs, MD   10 Units at 03/21/19 [phone]  . MEDLINE mouth rinse  15 mL Mouth Rinse BID Alma Friendly, MD   15 mL at 03/21/19 0957  . multivitamin (RENA-VIT) tablet 1 tablet  1 tablet Oral QHS Kyle, Tyrone A, DO   1 tablet at 03/20/19 2138  . ondansetron (ZOFRAN) tablet 4 mg  4 mg Oral Q6H PRN Etta Quill, DO       Or  . ondansetron [facility]) injection 4 mg  4 mg Intravenous Q6H PRN Etta Quill, DO      . pantoprazole (PROTONIX) EC tablet 40 mg  40 mg Oral Daily Nicholas Boroughs, MD   40 mg at 03/21/19 0956  . sevelamer carbonate (RENVELA) tablet 2,400 mg  2,400 mg Oral TID PRN Marylyn Ishihara, Tyrone A, DO      . sevelamer carbonate (RENVELA) tablet 4,800 mg  4,800 mg Oral TID WC Etta Quill, DO   4,800 mg at 03/21/19 6387  . Warfarin - Pharmacist Dosing Inpatient   Does not apply q1800 Henri Medal, [facility]   Stopped at 03/19/19 1800    VITAL SIGNS: BP (!) 104/52 (BP Location: Right Arm)   Pulse (!) 52   Temp 98 F (36.7 C) (Oral)   Resp 16   Ht 6\' 4"  (1.93 m)   Wt 100 kg   SpO2 98%   BMI 26.84 kg/m  Filed Weights   03/16/19 0025 03/16/19 0330 03/20/19 1300  Weight: 111 kg 109.8 kg 100 kg    Estimated body mass index is 26.84 kg/m as calculated from the following:   Height as of this encounter: 6\' 4"  (1.93 m).   Weight as of this encounter: 100 kg.  LABS: CBC:    Component Value Date/Time   WBC 3.5 (L) 03/21/2019 0522   HGB 9.4 (L) 03/21/2019 0522   HCT 28.6  (L) 03/21/2019 0522   HCT 27.7 (L) 03/17/2019 1208   PLT 89 (L) 03/21/2019 0522   Comprehensive Metabolic Panel:    Component Value Date/Time   NA 139 03/21/2019 0522   K 4.2 03/21/2019 0522   CO2 24 03/21/2019 0522   BUN 54 (H) 03/21/2019 0522   CREATININE 5.96 (H) 03/21/2019 0522   CREATININE 4.43 (H) 09/29/2014 1422   ALBUMIN 2.6 (L) 03/21/2019 0522     Review of Systems  Constitutional: Positive for activity change and fatigue.  Musculoskeletal: Positive for arthralgias and gait problem.  Neurological: Positive for weakness.   Unless otherwise noted, a complete review of systems is negative.    Prognosis: Patient is aware of guarded prognosis. Understands trajectory of his hypotension, bradycardia, ESRD, and also COVID-19. We discussed his poor long-term prognosis in the setting of natural disease trajectory. He is accepting of prognosis, yet remains somewhat hopeful.   Discharge Planning:  [place] for rehab with Palliative care service follow-up  Recommendations:  DNR/DNI-as confirmed by patient  Continue with current plan of care per medical team  Mr. Medinger remains hopeful for some improvement but is also preparing for the worst (no improvement or further decline). He makes note that he cannot continue to live in his current state of health and would not want to as he is currently miserable. He is hopeful he will begin to feel somewhat better, but if not he will then begin to re-evaluate his approach of care and consideration of focusing more on comfort.   Agreeable to outpatient palliative. (Referral placed)  PMT will continue to support and follow as needed.    Palliative Performance Scale: PPS 30%               Patient expressed understanding and was in agreement with this plan.   Thank you for allowing the Palliative Medicine Team to assist in the care of this patient.  The above conversation was completed via telephone due to the  restrictions during the COVID-19 pandemic. Thorough chart review and discussion with necessary members of the care team was completed as part of assessment. All issues were discussed and addressed but no physical exam was performed.  Time In: 1145 Time Out: 1235 Time Total: 50 min.   Visit consisted of counseling and education dealing with the complex and emotionally intense issues of symptom management and palliative care in the setting of serious and potentially life-threatening illness.Greater than 50%  of this time was spent counseling and coordinating care related to the above assessment and plan.  Signed by:  Alda Lea, AGPCNP-BC Palliative Medicine Team  Phone: [phone] Fax: [phone] Pager: [phone] Amion: Bjorn Pippin

## 2019-03-21 NOTE — Progress Notes (Signed)
Initial Nutrition Assessment  RD working remotely.  DOCUMENTATION CODES:   Not applicable  INTERVENTION:   - Pro-stat 30 ml po BID, each supplement provides 100 kcal and 15 grams of protein  - Magic cup BID with lunch and dinner meals meals, each supplement provides 290 kcal and 9 grams of protein  - Continue renal MVI daily  - Encourage adequate PO intake  NUTRITION DIAGNOSIS:   Increased nutrient needs related to acute illness, chronic illness (COVID-19, ESRD on HD) as evidenced by estimated needs.  GOAL:   Patient will meet greater than or equal to 90% of their needs  MONITOR:   PO intake, Supplement acceptance, Labs, Weight trends, I & O's, Skin  REASON FOR ASSESSMENT:   Low [place]    ASSESSMENT:   68 year old male who presented to the ED on 12/09 with weakness, COVID-19 positive. PMH of ESRD on HD, blindness, T2DM, HLD, HTN, prostate cancer.  Noted palliative care team consult pending.  Spoke with pt via phone call to room. Pt reports that his appetite was poor towards the beginning of his admission but that it has started to "pick up." Pt reports that for breakfast this AM, he consumed pancakes, eggs, sausage and oatmeal. Pt denies any N/V or abdominal pain.  RD discussed the importance of adequate PO intake with pt and discussed oral nutrition supplements. Pt reports that Nepro "makes my sugars go sky high." Pt declined Nepro shakes at this time but is willing to try Pro-stat. RD will also order Magic Cups with lunch and dinner meals.  Pt reports that his weight has been stable and that his UBW is 216 lbs. Current weight is 220 lbs (100 kg). EDW is 98 kg.  Meal Completion: 0-75% x last 8 meals (averaging 51%)  Medications reviewed and include: decadron, SSI, Lantus 10 units daily, rena-vit, protonix, Renvela, wafarin  Labs reviewed: phosphorus 5.9, hemoglobin 9.4  HD net UF on 12/16: 800 ml  NUTRITION - FOCUSED PHYSICAL EXAM:  Unable to complete at this  time. RD working remotely  Diet Order:   Diet Order            Diet renal with fluid restriction Fluid restriction: 1200 mL Fluid; Room service appropriate? Yes; Fluid consistency: Thin  Diet effective now              EDUCATION NEEDS:   Education needs have been addressed  Skin:  Skin Assessment: Reviewed RN Assessment (MASD groin, sacrum)  Last BM:  03/21/19 medium type 6  Height:   Ht Readings from Last 1 Encounters:  03/15/19 6\' 4"  (1.93 m)    Weight:   Wt Readings from Last 1 Encounters:  03/20/19 100 kg    Ideal Body Weight:  91.8 kg  BMI:  Body mass index is 26.84 kg/m.  Estimated Nutritional Needs:   Kcal:  2400-2600  Protein:  110-130 grams  Fluid:  UOP + 1000 ml    Gaynell Face, MS, RD, LDN Inpatient Clinical Dietitian Pager: [phone] Weekend/After Hours: [phone]

## 2019-03-22 DIAGNOSIS — E1100 Type 2 diabetes mellitus with hyperosmolarity without nonketotic hyperglycemic-hyperosmolar coma (NKHHC): Secondary | ICD-10-CM

## 2019-03-22 LAB — RENAL FUNCTION PANEL
Albumin: 2.5 g/dL — ABNORMAL LOW (ref 3.5–5.0)
Anion gap: 16 — ABNORMAL HIGH (ref 5–15)
BUN: 84 mg/dL — ABNORMAL HIGH (ref 8–23)
CO2: 23 mmol/L (ref 22–32)
Calcium: 8.2 mg/dL — ABNORMAL LOW (ref 8.9–10.3)
Chloride: 100 mmol/L (ref 98–111)
Creatinine, Ser: 7.75 mg/dL — ABNORMAL HIGH (ref 0.61–1.24)
GFR calc Af Amer: 7 mL/min — ABNORMAL LOW (ref >60)
GFR calc non Af Amer: 6 mL/min — ABNORMAL LOW (ref >60)
Glucose, Bld: 282 mg/dL — ABNORMAL HIGH (ref 70–99)
Phosphorus: 6.6 mg/dL — ABNORMAL HIGH (ref 2.5–4.6)
Potassium: 4.5 mmol/L (ref 3.5–5.1)
Sodium: 139 mmol/L (ref 135–145)

## 2019-03-22 LAB — CBC WITH DIFFERENTIAL/PLATELET
Abs Immature Granulocytes: 0.18 10*3/uL — ABNORMAL HIGH (ref 0.00–0.07)
Basophils Absolute: 0 10*3/uL (ref 0.0–0.1)
Basophils Relative: 0 %
Eosinophils Absolute: 0 10*3/uL (ref 0.0–0.5)
Eosinophils Relative: 0 %
HCT: 27.4 % — ABNORMAL LOW (ref 39.0–52.0)
Hemoglobin: 9.1 g/dL — ABNORMAL LOW (ref 13.0–17.0)
Immature Granulocytes: 5 %
Lymphocytes Relative: 4 %
Lymphs Abs: 0.1 10*3/uL — ABNORMAL LOW (ref 0.7–4.0)
MCH: 35.7 pg — ABNORMAL HIGH (ref 26.0–34.0)
MCHC: 33.2 g/dL (ref 30.0–36.0)
MCV: 107.5 fL — ABNORMAL HIGH (ref 80.0–100.0)
Monocytes Absolute: 0.3 10*3/uL (ref 0.1–1.0)
Monocytes Relative: 8 %
Neutro Abs: 2.8 10*3/uL (ref 1.7–7.7)
Neutrophils Relative %: 83 %
Platelets: 64 10*3/uL — ABNORMAL LOW (ref 150–400)
RBC: 2.55 MIL/uL — ABNORMAL LOW (ref 4.22–5.81)
RDW: 14 % (ref 11.5–15.5)
WBC: 3.4 10*3/uL — ABNORMAL LOW (ref 4.0–10.5)
nRBC: 0 % (ref 0.0–0.2)

## 2019-03-22 LAB — PROTIME-INR
INR: 5.2 (ref 0.8–1.2)
Prothrombin Time: 47.7 seconds — ABNORMAL HIGH (ref 11.4–15.2)

## 2019-03-22 LAB — FERRITIN: Ferritin: 2117 ng/mL — ABNORMAL HIGH (ref 24–336)

## 2019-03-22 LAB — GLUCOSE, CAPILLARY
Glucose-Capillary: 231 mg/dL — ABNORMAL HIGH (ref 70–99)
Glucose-Capillary: 261 mg/dL — ABNORMAL HIGH (ref 70–99)
Glucose-Capillary: 148 mg/dL — ABNORMAL HIGH (ref 70–99)

## 2019-03-22 MED ORDER — SODIUM CHLORIDE 0.9 % IV BOLUS
250.0000 mL | Freq: Once | INTRAVENOUS | Status: AC
  Administered 2019-03-22: 250 mL via INTRAVENOUS

## 2019-03-22 MED ORDER — METHYLPREDNISOLONE SODIUM SUCC 40 MG IJ SOLR
40.0000 mg | Freq: Two times a day (BID) | INTRAMUSCULAR | Status: DC
  Administered 2019-03-22 – 2019-03-27 (×10): 40 mg via INTRAVENOUS
  Filled 2019-03-22 (×10): qty 1

## 2019-03-22 NOTE — Progress Notes (Signed)
Patient received 250 ml NS bolus BP 110/65 and MAP is 80. Will continue to monitor patient.

## 2019-03-22 NOTE — Plan of Care (Signed)
  Problem: Clinical Measurements: Goal: Ability to maintain clinical measurements within normal limits will improve Outcome: Progressing Goal: Will remain free from infection Outcome: Progressing   Problem: Nutrition: Goal: Adequate nutrition will be maintained Outcome: Progressing   Problem: Coping: Goal: Level of anxiety will decrease Outcome: Progressing   Problem: Pain Managment: Goal: General experience of comfort will improve Outcome: Progressing   Problem: Safety: Goal: Ability to remain free from injury will improve Outcome: Progressing   Problem: Skin Integrity: Goal: Risk for impaired skin integrity will decrease Outcome: Progressing   

## 2019-03-22 NOTE — Progress Notes (Signed)
ANTICOAGULATION CONSULT NOTE - Follow-Up  Pharmacy Consult for Warfarin Indication: atrial fibrillation  Patient Measurements: Height: 6\' 4"  (193 cm) Weight: 247 lb (112 kg) IBW/kg (Calculated) : 86.8  Vital Signs: Temp: 97.8 F (36.6 C) (12/18 0521) Temp Source: Axillary (12/18 0521) BP: 86/48 (12/18 0521) Pulse Rate: 54 (12/18 0521)  Labs: Recent Labs    03/20/19 0355 03/21/19 0522 03/22/19 0500  HGB 9.6* 9.4* 9.1*  HCT 29.0* 28.6* 27.4*  PLT 123* 89* 64*  LABPROT 52.9* 57.9* 47.7*  INR 5.9* 6.6* 5.2*  CREATININE 10.91* 5.96* 7.75*    Estimated Creatinine Clearance: 12.3 mL/min (A) (by C-G formula based on SCr of 7.75 mg/dL (H)).   Medical History: Past Medical History:  Diagnosis Date  . Anemia   . Arthritis    "back, neck" (07/28/2014)  . Blind    both eyes removed   . Bruises easily   . CAD, NATIVE VESSEL 01/08/2008      . Cellulitis late 1980's   "hospitalized; wrapped both legs; several times; no OR for this"  . Colon polyps   . Diabetic neuropathy ([place])   . Dialysis patient ([place])   . DM type 2 (diabetes mellitus, type 2) ([place])    no medications (07/28/2014) diet and excersie controlled not onmeds for 14-15 years   . Dysrhythmia    afib   . ESRD (end stage renal disease) on dialysis [facility])    [name]; Mon, Wed, Fri (07/28/2014)  . Fall    hx of fall and required left hip surgery   . Family history of breast cancer    mother  . Heart murmur    hx of  . History of blood product transfusion   . HYPERLIPIDEMIA-MIXED 01/08/2008  . HYPERTENSION 01/27/2009   BP low , hx of HTN, Currently on meds to raise BP  . Hypotension   . Kidney stones   . Low blood pressure   . OVERWEIGHT/OBESITY 01/08/2008   Lost 205 lbs through diet and exercise.    . Peripheral vascular disease ([place])    vein stripping  . Pneumonia 2000;s X 1  . Prostate cancer ([place])   . Prosthetic eye globe    both eyes  . Renal cell carcinoma 2001 and 2003   "both kidneys"  . Renal  insufficiency      Assessment: 68 yo male presented on 03/13/2019 with weakness found to be positive for COVID-19. Patient has a history of slow Afib and was evaluated by cardiology on 02/15/2018 and recommended to begin anticoagulation but the patient did not follow up. Pharmacy has been consulted to dose warfarin for Afib. CHAD2-VASc score of 3. Patient is currently in AFib with HR in 43s. No reported bleeding   INR today is Supratherapeutic at 5.9>>6.6>>5.2. Hgb trending down 9.4>>9.1 - no overt bleeding noted. Given the large jump up in INR - will hold the warfarin dose tonight.  The patient was educated on warfarin via phone and the education sheet was placed in the discharge AVS.   Goal of Therapy:  INR 2-3 Monitor platelets by anticoagulation protocol: Yes   Plan:  - Continue to hold Warfarin dose today - Daily PT/INR, CBC q72h - Will continue to monitor for any signs/symptoms of bleeding and will follow up with PT/INR in the a.m.    Dniya Neuhaus A. Levada Dy, PharmD, BCPS, FNKF Clinical Pharmacist [place] Please utilize Amion for appropriate phone number to reach the unit pharmacist ([place])

## 2019-03-22 NOTE — Progress Notes (Addendum)
PROGRESS NOTE  [name] PTW:[id number] DOB: 08-Jan-1951 DOA: 03/13/2019 PCP: [name], MD   LOS: 8 days   Brief narrative: [name] is an 68 y.o. male medical history significant for ESRD on hemodialysis, atrial fibrillation on warfarin, chronic hypotension on midodrine, legal blindness. He was brought to the hospital because of generalized weakness and tested positive for corona virus infection.  Assessment/Plan:  Principal Problem:   COVID-19 virus infection Active Problems:   ESRD (end stage renal disease) on dialysis (HCC)   Hypotension of hemodialysis   Thrombocytopenia (HCC)   DM type 2 (diabetes mellitus, type 2) (HCC)   Bilateral blindness   Generalized weakness  COVID-19 pneumonia: Completed IV remdesivir on 03/18/2019. IV dexamethasone to be completed on 03/26/2019. Completed IV ceftriaxone and Rocephin.  Patient is on room air at this time.  Significant sinus bradycardia: Asymptomatic.  Seen by cardiology and patient is a poor candidate for pacemaker.  Chronic hypotension: He is on midodrine.  Did have a low blood pressure today.  Will need to continue on midodrine.  Closely monitor.  ESRD: Nephrology on board for hemodialysis/ultrafiltration.  Supratherapeutic INR/paroxysmal atrial fibrillation: He is on warfarin for long-term anticoagulation. INR is still supratherapeutic.  Continue to hold warfarin for Coumadin coagulopathy and monitor INR.  Continue Protonix.  Pharmacy to manage.  Thrombocytopenia: Platelet count  of 64,000.  Platelet count was 89,000 yesterday.  Continue to monitor.  No evidence of of bleeding at this time.  Debility: PT and OT.  social worker to assist with placement to SNF.  Patient came from an assisted living facility.  VTE Prophylaxis: Warfarin  Code Status: DNR  Family Communication: None.  Follow palliative care.  Disposition Plan: Skilled nursing facility.  Follow palliative care recommendations     Addendum:  03/22/2019 5:00 PM  Nursing staff reported that patient had low blood pressure but he was asymptomatic.  Patient does have chronic hypotension.  We will repeat gentle IV fluid at 50 mils per hour.  Change dexamethasone to Solu-Medrol to give some mineralocorticoid.  Spoke with the patient's son [name] on the phone and updated him about the condition of the patient.  Consultants:  Cardiology  Procedures:  hemodialysis  Antibiotics: Anti-infectives (From admission, onward)   Start     Dose/Rate Route Frequency Ordered Stop   03/15/19 1000  remdesivir 100 mg in sodium chloride 0.9 % 100 mL IVPB     100 mg 200 mL/hr over 30 Minutes Intravenous Daily 03/14/19 0800 03/18/19 1735   03/14/19 1100  azithromycin (ZITHROMAX) 500 mg in sodium chloride 0.9 % 250 mL IVPB  Status:  Discontinued     500 mg 250 mL/hr over 60 Minutes Intravenous Every 24 hours 03/14/19 0811 03/16/19 1218   03/14/19 1000  cefTRIAXone (ROCEPHIN) 1 g in sodium chloride 0.9 % 100 mL IVPB     1 g 200 mL/hr over 30 Minutes Intravenous Every 24 hours 03/14/19 0811 03/18/19 1153   03/14/19 0830  remdesivir 200 mg in sodium chloride 0.9% 250 mL IVPB     200 mg 580 mL/hr over 30 Minutes Intravenous Once 03/14/19 0800 03/14/19 0944      Subjective: Today, patient states that he does not feel well.  He hurts everywhere.  Denies shortness of breath.  No vomiting.  Objective: Vitals:   03/22/19 0838 03/22/19 0906  BP:  122/74  Pulse: (!) 55 (!) 48  Resp: 16 10  Temp:    SpO2: 100% 100%    Intake/Output  Summary (Last 24 hours) at 03/22/2019 1145 Last data filed at 03/21/2019 2000 Gross per 24 hour  Intake 360 ml  Output --  Net 360 ml   Filed Weights   03/16/19 0330 03/20/19 1300 03/22/19 0331  Weight: 109.8 kg 100 kg 112 kg   Body mass index is 30.07 kg/m.   Physical Exam: GENERAL: Patient is alert awake but feels irritated.. Not in obvious distress. HENT: Legally blind oral mucosa is  moist NECK: is supple, no palpable thyroid enlargement. CHEST:  Diminished breath sounds bilaterally. CVS: S1 and S2 heard, no murmur. Regular rate and rhythm. No pericardial rub. ABDOMEN: Soft, non-tender, bowel sounds are present. No palpable hepato-splenomegaly. EXTREMITIES: No edema.  Transmetatarsal amputation of the toes on the right foot. CNS: Cranial nerves are intact. No focal motor or sensory deficits. SKIN: Sacral decubitus ulcer present on admission  Data Review: I have personally reviewed the following laboratory data and studies,  CBC: Recent Labs  Lab 03/18/19 0458 03/19/19 0500 03/20/19 0355 03/21/19 0522 03/22/19 0500  WBC 3.6* 5.4 5.4 3.5* 3.4*  NEUTROABS 3.1 4.9 4.8 3.1 2.8  HGB 9.4* 10.0* 9.6* 9.4* 9.1*  HCT 28.0* 29.5* 29.0* 28.6* 27.4*  MCV 106.5* 105.0* 107.4* 105.5* 107.5*  PLT 114* 129* 123* 89* 64*   Basic Metabolic Panel: Recent Labs  Lab 03/16/19 0500 03/16/19 1235 03/17/19 1114 03/18/19 0458 03/19/19 0500 03/20/19 0355 03/21/19 0522 03/22/19 0500  NA 144  --   --  143 142 142 139 139  K 3.8  --   --  4.3 3.7 4.4 4.2 4.5  CL 101  --   --  100 100 99 99 100  CO2 25  --   --  21* 22 22 24 23   GLUCOSE 105*  --   --  260* 213* 366* 230* 282*  BUN 72*  --   --  116* 94* 122* 54* 84*  CREATININE 9.09*  --   --  11.35* 9.15* 10.91* 5.96* 7.75*  CALCIUM 8.3*  --   --  8.6* 8.8* 8.4* 8.4* 8.2*  MG 2.2  --  2.4 2.5*  --   --   --   --   PHOS 6.9*   < >  --  9.7* 7.0* 8.6* 5.9* 6.6*   < > = values in this interval not displayed.   Liver Function Tests: Recent Labs  Lab 03/16/19 1235 03/17/19 0408 03/18/19 0458 03/19/19 0500 03/20/19 0355 03/21/19 0522 03/22/19 0500  AST 21 20  --   --   --   --   --   ALT 18 17  --   --   --   --   --   ALKPHOS 80 93  --   --   --   --   --   BILITOT 1.2 0.9  --   --   --   --   --   PROT 5.0* 5.5*  --   --   --   --   --   ALBUMIN 2.3* 2.5* 2.5* 2.7* 2.5* 2.6* 2.5*   No results for input(s): LIPASE,  AMYLASE in the last 168 hours. No results for input(s): AMMONIA in the last 168 hours. Cardiac Enzymes: No results for input(s): CKTOTAL, CKMB, CKMBINDEX, TROPONINI in the last 168 hours. BNP (last 3 results) No results for input(s): BNP in the last 8760 hours.  ProBNP (last 3 results) No results for input(s): PROBNP in the last 8760 hours.  CBG: Recent  Labs  Lab 03/21/19 0936 03/21/19 1212 03/21/19 1653 03/21/19 2112 03/22/19 0754  GLUCAP 193* 279* 230* 270* 231*   Recent Results (from the past 240 hour(s))  Culture, blood (routine x 2)     Status: None   Collection Time: 03/14/19  8:57 AM   Specimen: BLOOD  Result Value Ref Range Status   Specimen Description   Final    BLOOD RIGHT ANTECUBITAL Performed at [facility] [address], [place], [place] [address]    Special Requests   Final    BOTTLES DRAWN AEROBIC AND ANAEROBIC Blood Culture adequate volume Performed at [facility] [address] [place], [place] [address]    Culture   Final    NO GROWTH 5 DAYS Performed at [facility], [place] [address], [place], [place] [address]    Report Status 03/19/2019 FINAL  Final  Culture, blood (routine x 2)     Status: None   Collection Time: 03/14/19  8:58 AM   Specimen: BLOOD  Result Value Ref Range Status   Specimen Description   Final    BLOOD BLOOD RIGHT FOREARM Performed at [facility] [address] [place], [place] [address]    Special Requests   Final    BOTTLES DRAWN AEROBIC AND ANAEROBIC Blood Culture adequate volume Performed at [facility] [address], [place], [place] [address]    Culture   Final    NO GROWTH 5 DAYS Performed at [facility], [place] [address], [place], [place] [address]    Report Status 03/19/2019 FINAL  Final  MRSA PCR Screening     Status: None   Collection Time: 03/15/19  3:08 AM   Specimen: Nasopharyngeal  Result Value Ref Range Status    MRSA by PCR NEGATIVE NEGATIVE Final    Comment:        The GeneXpert MRSA Assay (FDA approved for NASAL specimens only), is one component of a comprehensive MRSA colonization surveillance program. It is not intended to diagnose MRSA infection nor to guide or monitor treatment for MRSA infections. Performed at [facility], [place] [address], [place], [place] [address]   SARS CORONAVIRUS 2 (TAT 6-24 HRS) Nasopharyngeal Nasopharyngeal Swab     Status: Abnormal   Collection Time: 03/15/19  6:01 AM   Specimen: Nasopharyngeal Swab  Result Value Ref Range Status   SARS Coronavirus 2 POSITIVE (A) NEGATIVE Final    Comment: RESULT CALLED TO, READ BACK BY AND VERIFIED WITH: RN JOHN BRIDGES [phone] FCP (NOTE) SARS-CoV-2 target nucleic acids are DETECTED. The SARS-CoV-2 RNA is generally detectable in upper and lower respiratory specimens during the acute phase of infection. Positive results are indicative of the presence of SARS-CoV-2 RNA. Clinical correlation with patient history and other diagnostic information is  necessary to determine patient infection status. Positive results do not rule out bacterial infection or co-infection with other viruses.  The expected result is Negative. Fact Sheet for Patients: SugarRoll.be Fact Sheet for Healthcare Providers: https://www.woods-mathews.com/ This test is not yet approved or cleared by the [place] FDA and  has been authorized for detection and/or diagnosis of SARS-CoV-2 by FDA under an Emergency Use Authorization (EUA). This EUA will remain  in effect (meaning this test can be used) for the dur ation of the COVID-19 declaration under Section 564(b)(1) of the Act, 21 U.S.C. section 360bbb-3(b)(1), unless the authorization is terminated or revoked sooner. Performed at [facility], [address], [place], [place] [address]      Studies: No results found.  Scheduled  Meds: . albuterol  2 puff Inhalation Q6H  . atorvastatin  10 mg Oral Daily  . Chlorhexidine Gluconate Cloth  6 each Topical Q0600  . Chlorhexidine Gluconate Cloth  6 each Topical Q0600  . Chlorhexidine Gluconate Cloth  6 each Topical Q0600  . dexamethasone (DECADRON) injection  6 mg Intravenous Q24H  . feeding supplement (PRO-STAT SUGAR FREE 64)  30 mL Oral BID  . insulin aspart  0-9 Units Subcutaneous TID WC  . insulin glargine  10 Units Subcutaneous Daily  . mouth rinse  15 mL Mouth Rinse BID  . midodrine  5 mg Oral Q M,W,F-HD  . multivitamin  1 tablet Oral QHS  . pantoprazole  40 mg Oral Daily  . sevelamer carbonate  4,800 mg Oral TID WC  . Warfarin - Pharmacist Dosing Inpatient   Does not apply q1800    Continuous Infusions: . sodium chloride 1,000 mL (03/14/19 0851)     Flora Lipps, MD  [facility] 03/22/2019

## 2019-03-22 NOTE — Progress Notes (Signed)
CRITICAL VALUE ALERT  Critical Value:  INR = 5.2  Date & Time Notied: 12/18/202 at 0632  Provider Notified: APP [place]   Orders Received/Actions taken:

## 2019-03-22 NOTE — Progress Notes (Signed)
Reviewed tele with persistent sinus brady, into upper 20-30s at times, but 40-50s at this time.   Palliative care note pending, but pt now DNR.  He is a very poor candidate for pacing.   We will sign off, but please call back with any additional questions.   Legrand Como [address]" [place], PA-C  03/22/2019 8:43 AM

## 2019-03-22 NOTE — Progress Notes (Signed)
Treatment ended due to low BP dropped to 50 systolic. Alerted MD [place]. Patient states not feeling well. Alerted floor RN [facility]va and Ginger CN final blood pressure 67/41.

## 2019-03-22 NOTE — Progress Notes (Signed)
Occupational Therapy Treatment Patient Details Name: [name] MRN: [id number] DOB: 10-Aug-1950 Today's Date: 03/22/2019    History of present illness 68 yo admitted from [facility] with weakness and COVID +. Pt with sacral wound. PMhx: ESRD, bil blindness, hypotension on midodrine, DM, HLD, prostate CA, falls   OT comments  Pt presenting with decreased activity tolerance, making limited progress towards OT goals this session. Today Pt was max A +2 for peri care at bed level, able to perform bed mobility with max A +2 for physical assist - pt does use rails to assist. Pt declining OOB this session, while sitting EOB was able to perform UB bathing with max A. Pt complaining of neck pain sitting EOB due to arthritis in neck. Reports relief in supine. OT will continue to follow acutely. Current POC remains appropriate.    Follow Up Recommendations  SNF;Supervision/Assistance - 24 hour    Equipment Recommendations  Other (comment)(defer to next venue of care)    Recommendations for Other Services      Precautions / Restrictions Precautions Precautions: Fall Precaution Comments: blind Restrictions Weight Bearing Restrictions: No       Mobility Bed Mobility Overal bed mobility: Needs Assistance Bed Mobility: Sit to Supine;Rolling;Sidelying to Sit Rolling: Mod assist Sidelying to sit: +2 for physical assistance;+2 for safety/equipment;HOB elevated;Max assist   Sit to supine: Max assist;+2 for safety/equipment;+2 for physical assistance   General bed mobility comments: Pt able to assist with rolling for peri care and pad placenment with nicreased time and max A, physical assist and increased time to elevate trunk from surface with mod cues and +2 assist to achieve upright. Return to bed pt with assist to lift legs and control trunk. Pt with preference for right sidelying due to sacral wound  Transfers                 General transfer comment: declined this  session    Balance Overall balance assessment: Needs assistance Sitting-balance support: Feet supported;Bilateral upper extremity supported Sitting balance-Leahy Scale: Poor Sitting balance - Comments: Pt required at least mod A for seated balance EOB                                   ADL either performed or assessed with clinical judgement   ADL Overall ADL's : Needs assistance/impaired             Lower Body Bathing: Bed level;Maximal assistance               Toileting- Clothing Manipulation and Hygiene: Total assistance;+2 for physical assistance;+2 for safety/equipment;Bed level Toileting - Clothing Manipulation Details (indicate cue type and reason): bed level, Pt in bed with BM and unaware       General ADL Comments: pt with weakness, decreased sitting/standing balance, poor activity tolerance     Vision   Additional Comments: Pt blind at baseline   Perception     Praxis      Cognition Arousal/Alertness: Awake/alert Behavior During Therapy: WFL for tasks assessed/performed Overall Cognitive Status: Impaired/Different from baseline Area of Impairment: Safety/judgement                         Safety/Judgement: Decreased awareness of deficits              Exercises     Shoulder Instructions       General Comments  on RA throughout session    Pertinent Vitals/ Pain       Pain Assessment: 0-10 Pain Score: 6  Pain Location: neck and generalized Pain Descriptors / Indicators: Aching Pain Intervention(s): Limited activity within patient's tolerance;Monitored during session;Repositioned  Home Living                                          Prior Functioning/Environment              Frequency  Min 2X/week        Progress Toward Goals  OT Goals(current goals can now be found in the care plan section)  Progress towards OT goals: Not progressing toward goals - comment(decreased activity  tolerance/weakness)  Acute Rehab OT Goals Patient Stated Goal: return to ALF OT Goal Formulation: With patient Time For Goal Achievement: 04/02/19 Potential to Achieve Goals: Good  Plan Discharge plan remains appropriate;Frequency remains appropriate    Co-evaluation    PT/OT/SLP Co-Evaluation/Treatment: Yes Reason for Co-Treatment: For patient/therapist safety;Complexity of the patient's impairments (multi-system involvement) PT goals addressed during session: Balance OT goals addressed during session: ADL's and self-care      AM-PAC OT "6 Clicks" Daily Activity     Outcome Measure   Help from another person eating meals?: A Little Help from another person taking care of personal grooming?: A Little Help from another person toileting, which includes using toliet, bedpan, or urinal?: Total Help from another person bathing (including washing, rinsing, drying)?: A Lot Help from another person to put on and taking off regular upper body clothing?: A Little Help from another person to put on and taking off regular lower body clothing?: Total 6 Click Score: 13    End of Session    OT Visit Diagnosis: Unsteadiness on feet (R26.81);Muscle weakness (generalized) (M62.81);Other symptoms and signs involving cognitive function   Activity Tolerance Patient tolerated treatment well   Patient Left in bed;with call bell/phone within reach;with bed alarm set   Nurse Communication Mobility status        Time: 1308-6578 OT Time Calculation (min): 26 min  Charges: OT General Charges $OT Visit: 1 Visit OT Treatments $Self Care/Home Management : 8-22 mins  Jesse Sans OTR/L Acute Rehabilitation Services Pager: [phone] Office: [place] 03/22/2019, 1:34 PM

## 2019-03-22 NOTE — Progress Notes (Signed)
Patient ID: [name], male   DOB: September 06, 1950, 67 y.o.   MRN: [id number] S: BP continues to drop with HD despite midodrine O:BP (!) 69/54   Pulse (!) 59   Temp 97.8 F (36.6 C) (Axillary)   Resp 13   Ht 6\' 4"  (1.93 m)   Wt 109.8 kg   SpO2 100%   BMI 29.46 kg/m   Intake/Output Summary (Last 24 hours) at 03/22/2019 1455 Last data filed at 03/21/2019 2000 Gross per 24 hour  Intake 240 ml  Output --  Net 240 ml   Intake/Output: I/O last 3 completed shifts: In: 480 [P.O.:480] Out: -   Intake/Output this shift:  No intake/output data recorded. Weight change: 12 kg S:Hypotensive on HD but able to uf 2 liters today. O:BP (!) 69/54   Pulse (!) 59   Temp 97.8 F (36.6 C) (Axillary)   Resp 13   Ht 6\' 4"  (1.93 m)   Wt 109.8 kg   SpO2 100%   BMI 29.46 kg/m   Intake/Output Summary (Last 24 hours) at 03/22/2019 1455 Last data filed at 03/21/2019 2000 Gross per 24 hour  Intake 240 ml  Output --  Net 240 ml   Intake/Output: I/O last 3 completed shifts: In: 480 [P.O.:480] Out: -   Intake/Output this shift:  No intake/output data recorded. Weight change: 12 kg Physical exam: unable to complete due to COVID + status.  In order to preserve PPE equipment and to minimize exposure to providers.  Notes from other caregivers reviewed   Recent Labs  Lab 03/16/19 0500 03/16/19 1235 03/17/19 0408 03/18/19 0458 03/19/19 0500 03/20/19 0355 03/21/19 0522 03/22/19 0500  NA 144 143 145 143 142 142 139 139  K 3.8 3.6 4.3 4.3 3.7 4.4 4.2 4.5  CL 101 103 101 100 100 99 99 100  CO2 25 24 22  21* 22 22 24 23   GLUCOSE 105* 152* 192* 260* 213* 366* 230* 282*  BUN 72* 76* 90* 116* 94* 122* 54* 84*  CREATININE 9.09* 9.52* 10.23* 11.35* 9.15* 10.91* 5.96* 7.75*  ALBUMIN 2.3* 2.3* 2.5* 2.5* 2.7* 2.5* 2.6* 2.5*  CALCIUM 8.3* 8.0* 8.3* 8.6* 8.8* 8.4* 8.4* 8.2*  PHOS 6.9*  --  8.7* 9.7* 7.0* 8.6* 5.9* 6.6*  AST  --  21 20  --   --   --   --   --   ALT  --  18 17  --   --   --   --    --    Liver Function Tests: Recent Labs  Lab 03/16/19 1235 03/17/19 0408 03/20/19 0355 03/21/19 0522 03/22/19 0500  AST 21 20  --   --   --   ALT 18 17  --   --   --   ALKPHOS 80 93  --   --   --   BILITOT 1.2 0.9  --   --   --   PROT 5.0* 5.5*  --   --   --   ALBUMIN 2.3* 2.5* 2.5* 2.6* 2.5*   No results for input(s): LIPASE, AMYLASE in the last 168 hours. No results for input(s): AMMONIA in the last 168 hours. CBC: Recent Labs  Lab 03/18/19 0458 03/19/19 0500 03/20/19 0355 03/21/19 0522 03/22/19 0500  WBC 3.6* 5.4 5.4 3.5* 3.4*  NEUTROABS 3.1 4.9 4.8 3.1 2.8  HGB 9.4* 10.0* 9.6* 9.4* 9.1*  HCT 28.0* 29.5* 29.0* 28.6* 27.4*  MCV 106.5* 105.0* 107.4* 105.5* 107.5*  PLT 114* 129* 123*  89* 64*   Cardiac Enzymes: No results for input(s): CKTOTAL, CKMB, CKMBINDEX, TROPONINI in the last 168 hours. CBG: Recent Labs  Lab 03/21/19 1212 03/21/19 1653 03/21/19 2112 03/22/19 0754 03/22/19 1134  GLUCAP 279* 230* 270* 231* 261*    Iron Studies:  Recent Labs    03/22/19 0500  FERRITIN 2,117*   Studies/Results: No results found. [name] albuterol  2 puff Inhalation Q6H  . atorvastatin  10 mg Oral Daily  . Chlorhexidine Gluconate Cloth  6 each Topical Q0600  . Chlorhexidine Gluconate Cloth  6 each Topical Q0600  . Chlorhexidine Gluconate Cloth  6 each Topical Q0600  . dexamethasone (DECADRON) injection  6 mg Intravenous Q24H  . feeding supplement (PRO-STAT SUGAR FREE 64)  30 mL Oral BID  . insulin aspart  0-9 Units Subcutaneous TID WC  . insulin glargine  10 Units Subcutaneous Daily  . mouth rinse  15 mL Mouth Rinse BID  . midodrine  5 mg Oral Q M,W,F-HD  . multivitamin  1 tablet Oral QHS  . pantoprazole  40 mg Oral Daily  . sevelamer carbonate  4,800 mg Oral TID WC  . Warfarin - Pharmacist Dosing Inpatient   Does not apply q1800    BMET    Component Value Date/Time   NA 139 03/22/2019 0500   K 4.5 03/22/2019 0500   CL 100 03/22/2019 0500   CO2 23 03/22/2019  0500   GLUCOSE 282 (H) 03/22/2019 0500   BUN 84 (H) 03/22/2019 0500   CREATININE 7.75 (H) 03/22/2019 0500   CREATININE 4.43 (H) 09/29/2014 1422   CALCIUM 8.2 (L) 03/22/2019 0500   GFRNONAA 6 (L) 03/22/2019 0500   GFRNONAA 13 (L) 09/29/2014 1422   GFRAA 7 (L) 03/22/2019 0500   GFRAA 15 (L) 09/29/2014 1422   CBC    Component Value Date/Time   WBC 3.4 (L) 03/22/2019 0500   RBC 2.55 (L) 03/22/2019 0500   HGB 9.1 (L) 03/22/2019 0500   HCT 27.4 (L) 03/22/2019 0500   HCT 27.7 (L) 03/17/2019 1208   PLT 64 (L) 03/22/2019 0500   MCV 107.5 (H) 03/22/2019 0500   MCH 35.7 (H) 03/22/2019 0500   MCHC 33.2 03/22/2019 0500   RDW 14.0 03/22/2019 0500   LYMPHSABS 0.1 (L) 03/22/2019 0500   MONOABS 0.3 03/22/2019 0500   EOSABS 0.0 03/22/2019 0500   BASOSABS 0.0 03/22/2019 0500   Outpt HD:MWF [facility] 4h 450/800 98kg 2/2 bath AVF Hep none - parsabiv 7.5mg  tiw  - low UF 0.2, 0.8 recently   CXR 12/10 - no edema, RLL infiltrate  Assessment/Plan:  1. COVID + PNA- admitted 03/14/19.  S/p remdesivir, abx, decadron per primary svc 2. ESRD- continue with HD qMWF as tolerated 3. Chronic hypotension- cont with midodrine 4. Bradycardia- poor candidate for pacemaker per Cardiology consult.  Midodrine was held to see if this would improve HR. 5. DM type 2- per primary 6. Anemia of CKD stage 5- started on aranesp 40 mcg on 03/19/19 7. MBD- renal diet and binders 8. EOL- currently DNR, poor overall prognosis given multiple comorbidities.   Donetta Potts, MD [employer] [phone]

## 2019-03-22 NOTE — Progress Notes (Signed)
Physical Therapy Treatment Patient Details Name: [name] MRN: [id number] DOB: 06-10-1950 Today's Date: 03/22/2019    History of Present Illness 68 yo admitted from [facility] with weakness and COVID +. Pt with sacral wound. PMhx: ESRD, bil blindness, hypotension on midodrine, DM, HLD, prostate CA, falls    PT Comments    Pt supine in bed reluctant to participate in mobility with therapy due to pain. Pt found to be incontinent of stool and requires modA for rolling to perform pericare. Pt able to hold himself in sidelying with use of bedrails. With maxAx2 pt brought into sitting EoB for upper body bathing. Pt requiring modA for maintenance of balance EoB. Pt with c/o of increased neck pain in sitting requesting to return to bed. Once in supine pt reports decreased neck pain. D/c plans remain appropriate at this time. PT will continue to follow acutely.   Follow Up Recommendations  Supervision for mobility/OOB;SNF     Equipment Recommendations  None recommended by PT       Precautions / Restrictions Precautions Precautions: Fall Precaution Comments: blind Restrictions Weight Bearing Restrictions: No    Mobility  Bed Mobility Overal bed mobility: Needs Assistance Bed Mobility: Sit to Supine;Rolling;Sidelying to Sit Rolling: Mod assist Sidelying to sit: +2 for physical assistance;+2 for safety/equipment;HOB elevated;Max assist   Sit to supine: Max assist;+2 for safety/equipment;+2 for physical assistance   General bed mobility comments: Pt able to assist with rolling for peri care and pad placenment with nicreased time and max A, physical assist and increased time to elevate trunk from surface with mod cues and +2 assist to achieve upright. Return to bed pt with assist to lift legs and control trunk. Pt with preference for right sidelying due to sacral wound  Transfers                 General transfer comment: declined this session         Balance  Overall balance assessment: Needs assistance Sitting-balance support: Feet supported;Bilateral upper extremity supported Sitting balance-Leahy Scale: Poor Sitting balance - Comments: Pt required at least mod A for seated balance EOB                                    Cognition Arousal/Alertness: Awake/alert Behavior During Therapy: WFL for tasks assessed/performed Overall Cognitive Status: Impaired/Different from baseline Area of Impairment: Safety/judgement                         Safety/Judgement: Decreased awareness of deficits               General Comments General comments (skin integrity, edema, etc.): on RA throughout session      Pertinent Vitals/Pain Pain Assessment: 0-10 Pain Score: 6  Pain Location: neck and generalized Pain Descriptors / Indicators: Aching Pain Intervention(s): Limited activity within patient's tolerance;Repositioned           PT Goals (current goals can now be found in the care plan section) Acute Rehab PT Goals Patient Stated Goal: return to ALF PT Goal Formulation: With patient Time For Goal Achievement: 04/02/19 Potential to Achieve Goals: Fair Progress towards PT goals: Not progressing toward goals - comment(limited by RA/joint pain with mobility)    Frequency    Min 2X/week      PT Plan Current plan remains appropriate    Co-evaluation PT/OT/SLP Co-Evaluation/Treatment: Yes Reason for  Co-Treatment: For patient/therapist safety PT goals addressed during session: Mobility/safety with mobility OT goals addressed during session: ADL's and self-care      AM-PAC PT "6 Clicks" Mobility   Outcome Measure  Help needed turning from your back to your side while in a flat bed without using bedrails?: A Lot Help needed moving from lying on your back to sitting on the side of a flat bed without using bedrails?: Total Help needed moving to and from a bed to a chair (including a wheelchair)?: Total Help  needed standing up from a chair using your arms (e.g., wheelchair or bedside chair)?: Total Help needed to walk in hospital room?: Total Help needed climbing 3-5 steps with a railing? : Total 6 Click Score: 7    End of Session   Activity Tolerance: Patient limited by pain Patient left: in bed;with call bell/phone within reach;with bed alarm set;Other (comment) Nurse Communication: Mobility status PT Visit Diagnosis: Other abnormalities of gait and mobility (R26.89)     Time: 9311-2162 PT Time Calculation (min) (ACUTE ONLY): 26 min  Charges:  $Therapeutic Activity: 8-22 mins                     Kline Bulthuis B. Migdalia Dk PT, DPT Acute Rehabilitation Services Pager [phone] Office [phone]    [place] 03/22/2019, 3:49 PM

## 2019-03-23 LAB — RENAL FUNCTION PANEL
Albumin: 2.6 g/dL — ABNORMAL LOW (ref 3.5–5.0)
Anion gap: 17 — ABNORMAL HIGH (ref 5–15)
BUN: 67 mg/dL — ABNORMAL HIGH (ref 8–23)
CO2: 22 mmol/L (ref 22–32)
Calcium: 8 mg/dL — ABNORMAL LOW (ref 8.9–10.3)
Chloride: 97 mmol/L — ABNORMAL LOW (ref 98–111)
Creatinine, Ser: 6.57 mg/dL — ABNORMAL HIGH (ref 0.61–1.24)
GFR calc Af Amer: 9 mL/min — ABNORMAL LOW (ref >60)
GFR calc non Af Amer: 8 mL/min — ABNORMAL LOW (ref >60)
Glucose, Bld: 409 mg/dL — ABNORMAL HIGH (ref 70–99)
Phosphorus: 6 mg/dL — ABNORMAL HIGH (ref 2.5–4.6)
Potassium: 4.5 mmol/L (ref 3.5–5.1)
Sodium: 136 mmol/L (ref 135–145)

## 2019-03-23 LAB — CBC WITH DIFFERENTIAL/PLATELET
Abs Immature Granulocytes: 0.2 10*3/uL — ABNORMAL HIGH (ref 0.00–0.07)
Basophils Absolute: 0 10*3/uL (ref 0.0–0.1)
Basophils Relative: 0 %
Eosinophils Absolute: 0 10*3/uL (ref 0.0–0.5)
Eosinophils Relative: 0 %
HCT: 28.9 % — ABNORMAL LOW (ref 39.0–52.0)
Hemoglobin: 9.3 g/dL — ABNORMAL LOW (ref 13.0–17.0)
Immature Granulocytes: 5 %
Lymphocytes Relative: 3 %
Lymphs Abs: 0.1 10*3/uL — ABNORMAL LOW (ref 0.7–4.0)
MCH: 35.1 pg — ABNORMAL HIGH (ref 26.0–34.0)
MCHC: 32.2 g/dL (ref 30.0–36.0)
MCV: 109.1 fL — ABNORMAL HIGH (ref 80.0–100.0)
Monocytes Absolute: 0.2 10*3/uL (ref 0.1–1.0)
Monocytes Relative: 5 %
Neutro Abs: 3.6 10*3/uL (ref 1.7–7.7)
Neutrophils Relative %: 87 %
Platelets: 70 10*3/uL — ABNORMAL LOW (ref 150–400)
RBC: 2.65 MIL/uL — ABNORMAL LOW (ref 4.22–5.81)
RDW: 13.9 % (ref 11.5–15.5)
WBC: 4.1 10*3/uL (ref 4.0–10.5)
nRBC: 0 % (ref 0.0–0.2)

## 2019-03-23 LAB — GLUCOSE, CAPILLARY
Glucose-Capillary: 395 mg/dL — ABNORMAL HIGH (ref 70–99)
Glucose-Capillary: 407 mg/dL — ABNORMAL HIGH (ref 70–99)
Glucose-Capillary: 278 mg/dL — ABNORMAL HIGH (ref 70–99)
Glucose-Capillary: 177 mg/dL — ABNORMAL HIGH (ref 70–99)

## 2019-03-23 LAB — FERRITIN: Ferritin: 2345 ng/mL — ABNORMAL HIGH (ref 24–336)

## 2019-03-23 LAB — PROTIME-INR
INR: 4.8 (ref 0.8–1.2)
Prothrombin Time: 45.1 seconds — ABNORMAL HIGH (ref 11.4–15.2)

## 2019-03-23 NOTE — Progress Notes (Signed)
ANTICOAGULATION CONSULT NOTE - Follow-Up  Pharmacy Consult for Warfarin Indication: atrial fibrillation  Patient Measurements: Height: 6\' 4"  (193 cm) Weight: 238 lb (108 kg) IBW/kg (Calculated) : 86.8  Vital Signs: Temp: 97.9 F (36.6 C) (12/19 0900) Temp Source: Oral (12/19 0900) BP: 108/63 (12/19 0900) Pulse Rate: 56 (12/19 0900)  Labs: Recent Labs    03/21/19 0522 03/22/19 0500 03/23/19 0402  HGB 9.4* 9.1* 9.3*  HCT 28.6* 27.4* 28.9*  PLT 89* 64* 70*  LABPROT 57.9* 47.7* 45.1*  INR 6.6* 5.2* 4.8*  CREATININE 5.96* 7.75* 6.57*    Estimated Creatinine Clearance: 14.3 mL/min (A) (by C-G formula based on SCr of 6.57 mg/dL (H)).   Medical History: Past Medical History:  Diagnosis Date  . Anemia   . Arthritis    "back, neck" (07/28/2014)  . Blind    both eyes removed   . Bruises easily   . CAD, NATIVE VESSEL 01/08/2008      . Cellulitis late 1980's   "hospitalized; wrapped both legs; several times; no OR for this"  . Colon polyps   . Diabetic neuropathy ([place])   . Dialysis patient ([place])   . DM type 2 (diabetes mellitus, type 2) ([place])    no medications (07/28/2014) diet and excersie controlled not onmeds for 14-15 years   . Dysrhythmia    afib   . ESRD (end stage renal disease) on dialysis [facility])    [name]; Mon, Wed, Fri (07/28/2014)  . Fall    hx of fall and required left hip surgery   . Family history of breast cancer    mother  . Heart murmur    hx of  . History of blood product transfusion   . HYPERLIPIDEMIA-MIXED 01/08/2008  . HYPERTENSION 01/27/2009   BP low , hx of HTN, Currently on meds to raise BP  . Hypotension   . Kidney stones   . Low blood pressure   . OVERWEIGHT/OBESITY 01/08/2008   Lost 205 lbs through diet and exercise.    . Peripheral vascular disease ([place])    vein stripping  . Pneumonia 2000;s X 1  . Prostate cancer ([place])   . Prosthetic eye globe    both eyes  . Renal cell carcinoma 2001 and 2003   "both kidneys"  . Renal  insufficiency      Assessment: 68 yo male presented on 03/13/2019 with weakness found to be positive for COVID-19. Patient has a history of slow Afib and was evaluated by cardiology on 02/15/2018 and recommended to begin anticoagulation but the patient did not follow up. Pharmacy has been consulted to dose warfarin for Afib. CHAD2-VASc score of 3.   INR today is still supratherapeutic at 4.8 down from 5.2 yesterday. Hgb stable at 9.3. Platelets low stable at 70k. No bleeding noted.   Goal of Therapy:  INR 2-3 Monitor platelets by anticoagulation protocol: Yes   Plan:  - Continue to hold Warfarin dose today - Daily PT/INR, CBC q72h - Will continue to monitor for any signs/symptoms of bleeding and will follow up with PT/INR in the a.m.    Agnes Lawrence, PharmD PGY1 Pharmacy Resident

## 2019-03-23 NOTE — Progress Notes (Addendum)
PROGRESS NOTE  Ibraham Levi [name] GYF:[id number] DOB: 01-14-1951 DOA: 03/13/2019 PCP: [name], MD   LOS: 9 days   Brief narrative: [name] is an 68 y.o. male medical history significant for ESRD on hemodialysis, atrial fibrillation on warfarin, chronic hypotension on midodrine, legal blindness. He was brought to the hospital because of generalized weakness and tested positive for corona virus infection.  Assessment/Plan:  Principal Problem:   COVID-19 virus infection Active Problems:   ESRD (end stage renal disease) on dialysis (HCC)   Hypotension of hemodialysis   Thrombocytopenia (HCC)   DM type 2 (diabetes mellitus, type 2) (HCC)   Bilateral blindness   Generalized weakness  COVID-19 pneumonia: Completed IV remdesivir on 03/18/2019. IV dexamethasone to be completed on 03/26/2019. Completed IV ceftriaxone and Rocephin.  Patient is on room air at this time.  WBC has normalized.  COVID-19 Labs  Recent Labs    03/21/19 0522 03/22/19 0500 03/23/19 0402  FERRITIN 2,135* 2,117* 2,345*    Lab Results  Component Value Date   SARSCOV2NAA POSITIVE (A) 03/15/2019   Significant sinus bradycardia: Asymptomatic.  Seen by cardiology and patient is a poor candidate for pacemaker.  Last heart rate of 56 bpm.  Chronic hypotension: He is on midodrine.  Changed dexamethasone to Solu-Medrol yesterday.  Blood pressure noted at 108/ 63 today.  Patient received IV fluid bolus yesterday  ESRD: Nephrology on board for hemodialysis/ultrafiltration.  Supratherapeutic INR/paroxysmal atrial fibrillation: He is on warfarin for long-term anticoagulation. INR is still supratherapeutic.  Continue to hold warfarin for Coumadin coagulopathy and monitor INR.  Continue Protonix.  Pharmacy to manage.  INR of 4.8 today.  Thrombocytopenia: Platelet count  of 70,000 from 64,000.   Continue to monitor.  No evidence of of bleeding at this time.  Debility/deconditioning: PT and OT recommend  placement SNF.  Patient came from an assisted living facility.  VTE Prophylaxis: Warfarin  Code Status: DNR  Family Communication: None today.  I spoke with the patient's son and updated him about the clinical condition of the patient yesterday.  Disposition Plan: Skilled nursing facility.  Follow palliative care recommendations    Consultants:  Cardiology  Nephrology  Procedures:  hemodialysis  Antibiotics: Anti-infectives (From admission, onward)   Start     Dose/Rate Route Frequency Ordered Stop   03/15/19 1000  remdesivir 100 mg in sodium chloride 0.9 % 100 mL IVPB     100 mg 200 mL/hr over 30 Minutes Intravenous Daily 03/14/19 0800 03/18/19 1735   03/14/19 1100  azithromycin (ZITHROMAX) 500 mg in sodium chloride 0.9 % 250 mL IVPB  Status:  Discontinued     500 mg 250 mL/hr over 60 Minutes Intravenous Every 24 hours 03/14/19 0811 03/16/19 1218   03/14/19 1000  cefTRIAXone (ROCEPHIN) 1 g in sodium chloride 0.9 % 100 mL IVPB     1 g 200 mL/hr over 30 Minutes Intravenous Every 24 hours 03/14/19 0811 03/18/19 1153   03/14/19 0830  remdesivir 200 mg in sodium chloride 0.9% 250 mL IVPB     200 mg 580 mL/hr over 30 Minutes Intravenous Once 03/14/19 0800 03/14/19 0944      Subjective: Today, denies vomiting abdominal pain or increased shortness of breath.  He is feels weak all over with generalized pain.  Objective: Vitals:   03/22/19 2035 03/23/19 0517  BP:    Pulse:    Resp:    Temp: 98.5 F (36.9 C) 97.9 F (36.6 C)  SpO2:      Intake/Output  Summary (Last 24 hours) at 03/23/2019 0735 Last data filed at 03/22/2019 1846 Gross per 24 hour  Intake 600 ml  Output 854 ml  Net -254 ml   Filed Weights   03/22/19 0331 03/22/19 1230 03/22/19 1526  Weight: 112 kg 109.8 kg 108 kg   Body mass index is 28.97 kg/m.   Physical Exam: General: Thinly built, alert awake and communicative.  Mildly anxious. HENT: Normocephalic, legally blind.  No scleral pallor or  icterus noted. Oral mucosa is mildly dry. Chest: Diminished breath sounds bilaterally  CVS: S1 &S2 heard. No murmur.  Regular rate and rhythm. Abdomen: Soft, nontender, nondistended.  Bowel sounds are heard.  Extremities: No cyanosis, clubbing or edema.  Transmetatarsal amputation on the right foot.  Left upper arm fistula Psych: Alert, awake and mildly communicative. CNS:  No cranial nerve deficits.  Moving all extremities.   Skin: Sacral decubitus ulceration present on admission  Data Review: I have personally reviewed the following laboratory data and studies,  CBC: Recent Labs  Lab 03/19/19 0500 03/20/19 0355 03/21/19 0522 03/22/19 0500 03/23/19 0402  WBC 5.4 5.4 3.5* 3.4* 4.1  NEUTROABS 4.9 4.8 3.1 2.8 3.6  HGB 10.0* 9.6* 9.4* 9.1* 9.3*  HCT 29.5* 29.0* 28.6* 27.4* 28.9*  MCV 105.0* 107.4* 105.5* 107.5* 109.1*  PLT 129* 123* 89* 64* 70*   Basic Metabolic Panel: Recent Labs  Lab 03/17/19 1114 03/18/19 0458 03/19/19 0500 03/20/19 0355 03/21/19 0522 03/22/19 0500 03/23/19 0402  NA  --  143 142 142 139 139 136  K  --  4.3 3.7 4.4 4.2 4.5 4.5  CL  --  100 100 99 99 100 97*  CO2  --  21* 22 22 24 23 22   GLUCOSE  --  260* 213* 366* 230* 282* 409*  BUN  --  116* 94* 122* 54* 84* 67*  CREATININE  --  11.35* 9.15* 10.91* 5.96* 7.75* 6.57*  CALCIUM  --  8.6* 8.8* 8.4* 8.4* 8.2* 8.0*  MG 2.4 2.5*  --   --   --   --   --   PHOS  --  9.7* 7.0* 8.6* 5.9* 6.6* 6.0*   Liver Function Tests: Recent Labs  Lab 03/16/19 1235 03/17/19 0408 03/19/19 0500 03/20/19 0355 03/21/19 0522 03/22/19 0500 03/23/19 0402  AST 21 20  --   --   --   --   --   ALT 18 17  --   --   --   --   --   ALKPHOS 80 93  --   --   --   --   --   BILITOT 1.2 0.9  --   --   --   --   --   PROT 5.0* 5.5*  --   --   --   --   --   ALBUMIN 2.3* 2.5* 2.7* 2.5* 2.6* 2.5* 2.6*   No results for input(s): LIPASE, AMYLASE in the last 168 hours. No results for input(s): AMMONIA in the last 168  hours. Cardiac Enzymes: No results for input(s): CKTOTAL, CKMB, CKMBINDEX, TROPONINI in the last 168 hours. BNP (last 3 results) No results for input(s): BNP in the last 8760 hours.  ProBNP (last 3 results) No results for input(s): PROBNP in the last 8760 hours.  CBG: Recent Labs  Lab 03/21/19 1653 03/21/19 2112 03/22/19 0754 03/22/19 1134 03/22/19 1647  GLUCAP 230* 270* 231* 261* 148*   Recent Results (from the past 240 hour(s))  Culture, blood (  routine x 2)     Status: None   Collection Time: 03/14/19  8:57 AM   Specimen: BLOOD  Result Value Ref Range Status   Specimen Description   Final    BLOOD RIGHT ANTECUBITAL Performed at [facility] [address], [place], [place] [address]    Special Requests   Final    BOTTLES DRAWN AEROBIC AND ANAEROBIC Blood Culture adequate volume Performed at [facility] [address] [place], [place] [address]    Culture   Final    NO GROWTH 5 DAYS Performed at [facility], [place] [address] [place], [place] [address]    Report Status 03/19/2019 FINAL  Final  Culture, blood (routine x 2)     Status: None   Collection Time: 03/14/19  8:58 AM   Specimen: BLOOD  Result Value Ref Range Status   Specimen Description   Final    BLOOD BLOOD RIGHT FOREARM Performed at [facility] [address] [place], [place] [address]    Special Requests   Final    BOTTLES DRAWN AEROBIC AND ANAEROBIC Blood Culture adequate volume Performed at [facility] [address], [place], [place] [address]    Culture   Final    NO GROWTH 5 DAYS Performed at [facility], [place] [address], [place], [place] [address]    Report Status 03/19/2019 FINAL  Final  MRSA PCR Screening     Status: None   Collection Time: 03/15/19  3:08 AM   Specimen: Nasopharyngeal  Result Value Ref Range Status   MRSA by PCR NEGATIVE NEGATIVE Final    Comment:        The GeneXpert MRSA  Assay (FDA approved for NASAL specimens only), is one component of a comprehensive MRSA colonization surveillance program. It is not intended to diagnose MRSA infection nor to guide or monitor treatment for MRSA infections. Performed at [facility], [place] [address] [place], [place] [address]   SARS CORONAVIRUS 2 (TAT 6-24 HRS) Nasopharyngeal Nasopharyngeal Swab     Status: Abnormal   Collection Time: 03/15/19  6:01 AM   Specimen: Nasopharyngeal Swab  Result Value Ref Range Status   SARS Coronavirus 2 POSITIVE (A) NEGATIVE Final    Comment: RESULT CALLED TO, READ BACK BY AND VERIFIED WITH: RN JOHN BRIDGES [phone] FCP (NOTE) SARS-CoV-2 target nucleic acids are DETECTED. The SARS-CoV-2 RNA is generally detectable in upper and lower respiratory specimens during the acute phase of infection. Positive results are indicative of the presence of SARS-CoV-2 RNA. Clinical correlation with patient history and other diagnostic information is  necessary to determine patient infection status. Positive results do not rule out bacterial infection or co-infection with other viruses.  The expected result is Negative. Fact Sheet for Patients: SugarRoll.be Fact Sheet for Healthcare Providers: https://www.woods-mathews.com/ This test is not yet approved or cleared by the [place] FDA and  has been authorized for detection and/or diagnosis of SARS-CoV-2 by FDA under an Emergency Use Authorization (EUA). This EUA will remain  in effect (meaning this test can be used) for the dur ation of the COVID-19 declaration under Section 564(b)(1) of the Act, 21 U.S.C. section 360bbb-3(b)(1), unless the authorization is terminated or revoked sooner. Performed at [facility], [place] [address], [place], [place] [address]      Studies: No results found.  Scheduled Meds: . albuterol  2 puff Inhalation Q6H  . atorvastatin  10  mg Oral Daily  .  Chlorhexidine Gluconate Cloth  6 each Topical Q0600  . Chlorhexidine Gluconate Cloth  6 each Topical Q0600  . Chlorhexidine Gluconate Cloth  6 each Topical Q0600  . feeding supplement (PRO-STAT SUGAR FREE 64)  30 mL Oral BID  . insulin aspart  0-9 Units Subcutaneous TID WC  . insulin glargine  10 Units Subcutaneous Daily  . mouth rinse  15 mL Mouth Rinse BID  . methylPREDNISolone (SOLU-MEDROL) injection  40 mg Intravenous Q12H  . midodrine  5 mg Oral Q M,W,F-HD  . multivitamin  1 tablet Oral QHS  . pantoprazole  40 mg Oral Daily  . sevelamer carbonate  4,800 mg Oral TID WC  . Warfarin - Pharmacist Dosing Inpatient   Does not apply q1800    Continuous Infusions: . sodium chloride 1,000 mL (03/14/19 0851)     Flora Lipps, MD  [facility] 03/23/2019

## 2019-03-23 NOTE — Progress Notes (Signed)
Patient ID: [name], male   DOB: 09-16-1950, 68 y.o.   MRN: [id number] S:dropped his BP yesterday and given NS bolus.  Will decrease uf goal with next HD.  O:BP (!) 112/58 (BP Location: Right Arm)   Pulse (!) 56   Temp 97.9 F (36.6 C) (Oral)   Resp 12   Ht 6\' 4"  (1.93 m)   Wt 108 kg   SpO2 100%   BMI 28.97 kg/m   Intake/Output Summary (Last 24 hours) at 03/23/2019 1602 Last data filed at 03/23/2019 1300 Gross per 24 hour  Intake 600 ml  Output --  Net 600 ml   Intake/Output: I/O last 3 completed shifts: In: 1080 [P.O.:1080] Out: 854 [Other:854]  Intake/Output this shift:  No intake/output data recorded. Weight change: -2.268 kg Physical exam: unable to complete due to COVID + status.  In order to preserve PPE equipment and to minimize exposure to providers.  Notes from other caregivers reviewed   Recent Labs  Lab 03/17/19 0408 03/18/19 0458 03/19/19 0500 03/20/19 0355 03/21/19 0522 03/22/19 0500 03/23/19 0402  NA 145 143 142 142 139 139 136  K 4.3 4.3 3.7 4.4 4.2 4.5 4.5  CL 101 100 100 99 99 100 97*  CO2 22 21* 22 22 24 23 22   GLUCOSE 192* 260* 213* 366* 230* 282* 409*  BUN 90* 116* 94* 122* 54* 84* 67*  CREATININE 10.23* 11.35* 9.15* 10.91* 5.96* 7.75* 6.57*  ALBUMIN 2.5* 2.5* 2.7* 2.5* 2.6* 2.5* 2.6*  CALCIUM 8.3* 8.6* 8.8* 8.4* 8.4* 8.2* 8.0*  PHOS 8.7* 9.7* 7.0* 8.6* 5.9* 6.6* 6.0*  AST 20  --   --   --   --   --   --   ALT 17  --   --   --   --   --   --    Liver Function Tests: Recent Labs  Lab 03/17/19 0408 03/21/19 0522 03/22/19 0500 03/23/19 0402  AST 20  --   --   --   ALT 17  --   --   --   ALKPHOS 93  --   --   --   BILITOT 0.9  --   --   --   PROT 5.5*  --   --   --   ALBUMIN 2.5* 2.6* 2.5* 2.6*   No results for input(s): LIPASE, AMYLASE in the last 168 hours. No results for input(s): AMMONIA in the last 168 hours. CBC: Recent Labs  Lab 03/19/19 0500 03/20/19 0355 03/21/19 0522 03/22/19 0500 03/23/19 0402  WBC 5.4 5.4  3.5* 3.4* 4.1  NEUTROABS 4.9 4.8 3.1 2.8 3.6  HGB 10.0* 9.6* 9.4* 9.1* 9.3*  HCT 29.5* 29.0* 28.6* 27.4* 28.9*  MCV 105.0* 107.4* 105.5* 107.5* 109.1*  PLT 129* 123* 89* 64* 70*   Cardiac Enzymes: No results for input(s): CKTOTAL, CKMB, CKMBINDEX, TROPONINI in the last 168 hours. CBG: Recent Labs  Lab 03/22/19 0754 03/22/19 1134 03/22/19 1647 03/23/19 0812 03/23/19 1148  GLUCAP 231* 261* 148* 395* 407*    Iron Studies:  Recent Labs    03/23/19 0402  FERRITIN 2,345*   Studies/Results: No results found. [name] albuterol  2 puff Inhalation Q6H  . atorvastatin  10 mg Oral Daily  . Chlorhexidine Gluconate Cloth  6 each Topical Q0600  . Chlorhexidine Gluconate Cloth  6 each Topical Q0600  . Chlorhexidine Gluconate Cloth  6 each Topical Q0600  . feeding supplement (PRO-STAT SUGAR FREE 64)  30 mL Oral BID  .  insulin aspart  0-9 Units Subcutaneous TID WC  . insulin glargine  10 Units Subcutaneous Daily  . mouth rinse  15 mL Mouth Rinse BID  . methylPREDNISolone (SOLU-MEDROL) injection  40 mg Intravenous Q12H  . midodrine  5 mg Oral Q M,W,F-HD  . multivitamin  1 tablet Oral QHS  . pantoprazole  40 mg Oral Daily  . sevelamer carbonate  4,800 mg Oral TID WC  . Warfarin - Pharmacist Dosing Inpatient   Does not apply q1800    BMET    Component Value Date/Time   NA 136 03/23/2019 0402   K 4.5 03/23/2019 0402   CL 97 (L) 03/23/2019 0402   CO2 22 03/23/2019 0402   GLUCOSE 409 (H) 03/23/2019 0402   BUN 67 (H) 03/23/2019 0402   CREATININE 6.57 (H) 03/23/2019 0402   CREATININE 4.43 (H) 09/29/2014 1422   CALCIUM 8.0 (L) 03/23/2019 0402   GFRNONAA 8 (L) 03/23/2019 0402   GFRNONAA 13 (L) 09/29/2014 1422   GFRAA 9 (L) 03/23/2019 0402   GFRAA 15 (L) 09/29/2014 1422   CBC    Component Value Date/Time   WBC 4.1 03/23/2019 0402   RBC 2.65 (L) 03/23/2019 0402   HGB 9.3 (L) 03/23/2019 0402   HCT 28.9 (L) 03/23/2019 0402   HCT 27.7 (L) 03/17/2019 1208   PLT 70 (L) 03/23/2019 0402    MCV 109.1 (H) 03/23/2019 0402   MCH 35.1 (H) 03/23/2019 0402   MCHC 32.2 03/23/2019 0402   RDW 13.9 03/23/2019 0402   LYMPHSABS 0.1 (L) 03/23/2019 0402   MONOABS 0.2 03/23/2019 0402   EOSABS 0.0 03/23/2019 0402   BASOSABS 0.0 03/23/2019 0402    Outpt HD:MWF [facility] 4h 450/800 98kg 2/2 bath AVF Hep none - parsabiv 7.5mg  tiw  - low UF 0.2, 0.8 recently   CXR 12/10 - no edema, RLL infiltrate  Assessment/Plan:  1. COVID + PNA- admitted 03/14/19.  S/p remdesivir, abx, decadron per primary svc 2. ESRD- continue with HD qMWF as tolerated 3. Chronic hypotension- cont with midodrine 4. Bradycardia- poor candidate for pacemaker per Cardiology consult.  Midodrine was held to see if this would improve HR. 5. DM type 2- per primary 6. Anemia of CKD stage 5- started on aranesp 40 mcg on 03/19/19 7. MBD- renal diet and binders 8. EOL- currently DNR, poor overall prognosis given multiple comorbidities.   Donetta Potts, MD [employer] [phone]

## 2019-03-24 LAB — CBC WITH DIFFERENTIAL/PLATELET
Abs Immature Granulocytes: 0.26 10*3/uL — ABNORMAL HIGH (ref 0.00–0.07)
Basophils Absolute: 0 10*3/uL (ref 0.0–0.1)
Basophils Relative: 0 %
Eosinophils Absolute: 0 10*3/uL (ref 0.0–0.5)
Eosinophils Relative: 0 %
HCT: 27.6 % — ABNORMAL LOW (ref 39.0–52.0)
Hemoglobin: 9.4 g/dL — ABNORMAL LOW (ref 13.0–17.0)
Immature Granulocytes: 4 %
Lymphocytes Relative: 2 %
Lymphs Abs: 0.1 10*3/uL — ABNORMAL LOW (ref 0.7–4.0)
MCH: 35.6 pg — ABNORMAL HIGH (ref 26.0–34.0)
MCHC: 34.1 g/dL (ref 30.0–36.0)
MCV: 104.5 fL — ABNORMAL HIGH (ref 80.0–100.0)
Monocytes Absolute: 0.2 10*3/uL (ref 0.1–1.0)
Monocytes Relative: 3 %
Neutro Abs: 5.3 10*3/uL (ref 1.7–7.7)
Neutrophils Relative %: 91 %
Platelets: 72 10*3/uL — ABNORMAL LOW (ref 150–400)
RBC: 2.64 MIL/uL — ABNORMAL LOW (ref 4.22–5.81)
RDW: 13.6 % (ref 11.5–15.5)
WBC: 5.9 10*3/uL (ref 4.0–10.5)
nRBC: 0 % (ref 0.0–0.2)

## 2019-03-24 LAB — RENAL FUNCTION PANEL
Albumin: 2.6 g/dL — ABNORMAL LOW (ref 3.5–5.0)
Anion gap: 17 — ABNORMAL HIGH (ref 5–15)
BUN: 93 mg/dL — ABNORMAL HIGH (ref 8–23)
CO2: 22 mmol/L (ref 22–32)
Calcium: 8.3 mg/dL — ABNORMAL LOW (ref 8.9–10.3)
Chloride: 99 mmol/L (ref 98–111)
Creatinine, Ser: 7.6 mg/dL — ABNORMAL HIGH (ref 0.61–1.24)
GFR calc Af Amer: 8 mL/min — ABNORMAL LOW (ref >60)
GFR calc non Af Amer: 7 mL/min — ABNORMAL LOW (ref >60)
Glucose, Bld: 282 mg/dL — ABNORMAL HIGH (ref 70–99)
Phosphorus: 6.8 mg/dL — ABNORMAL HIGH (ref 2.5–4.6)
Potassium: 4.3 mmol/L (ref 3.5–5.1)
Sodium: 138 mmol/L (ref 135–145)

## 2019-03-24 LAB — FERRITIN: Ferritin: 2464 ng/mL — ABNORMAL HIGH (ref 24–336)

## 2019-03-24 LAB — GLUCOSE, CAPILLARY
Glucose-Capillary: 229 mg/dL — ABNORMAL HIGH (ref 70–99)
Glucose-Capillary: 262 mg/dL — ABNORMAL HIGH (ref 70–99)
Glucose-Capillary: 308 mg/dL — ABNORMAL HIGH (ref 70–99)
Glucose-Capillary: 339 mg/dL — ABNORMAL HIGH (ref 70–99)

## 2019-03-24 LAB — PROTIME-INR
INR: 4.4 (ref 0.8–1.2)
Prothrombin Time: 41.7 seconds — ABNORMAL HIGH (ref 11.4–15.2)

## 2019-03-24 MED ORDER — ALBUTEROL SULFATE HFA 108 (90 BASE) MCG/ACT IN AERS
2.0000 | INHALATION_SPRAY | Freq: Two times a day (BID) | RESPIRATORY_TRACT | Status: DC
  Administered 2019-03-25 – 2019-03-28 (×6): 2 via RESPIRATORY_TRACT
  Filled 2019-03-24: qty 6.7

## 2019-03-24 MED ORDER — ALBUTEROL SULFATE HFA 108 (90 BASE) MCG/ACT IN AERS
2.0000 | INHALATION_SPRAY | RESPIRATORY_TRACT | Status: DC | PRN
  Filled 2019-03-24: qty 6.7

## 2019-03-24 NOTE — Progress Notes (Signed)
ANTICOAGULATION CONSULT NOTE - Follow-Up  Pharmacy Consult for Warfarin Indication: atrial fibrillation  Patient Measurements: Height: 6\' 4"  (193 cm) Weight: 244 lb (110.7 kg) IBW/kg (Calculated) : 86.8  Vital Signs: Temp: 98.2 F (36.8 C) (12/20 0605) Temp Source: Oral (12/20 0605) BP: 114/63 (12/20 0636) Pulse Rate: 54 (12/20 0636)  Labs: Recent Labs    03/22/19 0500 03/23/19 0402 03/24/19 0357 03/24/19 0358  HGB 9.1* 9.3*  --  9.4*  HCT 27.4* 28.9*  --  27.6*  PLT 64* 70*  --  72*  LABPROT 47.7* 45.1*  --  41.7*  INR 5.2* 4.8*  --  4.4*  CREATININE 7.75* 6.57* 7.60*  --     Estimated Creatinine Clearance: 12.5 mL/min (A) (by C-G formula based on SCr of 7.6 mg/dL (H)).   Medical History: Past Medical History:  Diagnosis Date  . Anemia   . Arthritis    "back, neck" (07/28/2014)  . Blind    both eyes removed   . Bruises easily   . CAD, NATIVE VESSEL 01/08/2008      . Cellulitis late 1980's   "hospitalized; wrapped both legs; several times; no OR for this"  . Colon polyps   . Diabetic neuropathy ([place])   . Dialysis patient ([place])   . DM type 2 (diabetes mellitus, type 2) ([place])    no medications (07/28/2014) diet and excersie controlled not onmeds for 14-15 years   . Dysrhythmia    afib   . ESRD (end stage renal disease) on dialysis [facility])    [name]; Mon, Wed, Fri (07/28/2014)  . Fall    hx of fall and required left hip surgery   . Family history of breast cancer    mother  . Heart murmur    hx of  . History of blood product transfusion   . HYPERLIPIDEMIA-MIXED 01/08/2008  . HYPERTENSION 01/27/2009   BP low , hx of HTN, Currently on meds to raise BP  . Hypotension   . Kidney stones   . Low blood pressure   . OVERWEIGHT/OBESITY 01/08/2008   Lost 205 lbs through diet and exercise.    . Peripheral vascular disease ([place])    vein stripping  . Pneumonia 2000;s X 1  . Prostate cancer ([place])   . Prosthetic eye globe    both eyes  . Renal cell carcinoma  2001 and 2003   "both kidneys"  . Renal insufficiency      Assessment: 67 yo male presented on 03/13/2019 with weakness found to be positive for COVID-19. Patient has a history of slow Afib and was evaluated by cardiology on 02/15/2018 and recommended to begin anticoagulation but the patient did not follow up. Pharmacy has been consulted to dose warfarin for Afib. CHAD2-VASc score of 3.   INR today is still supratherapeutic at 4.4 down from 4.8 yesterday. Hgb stable at 9.4. Platelets low stable at 72k. No bleeding noted.   Goal of Therapy:  INR 2-3 Monitor platelets by anticoagulation protocol: Yes   Plan:  - Continue to hold Warfarin dose today - Daily PT/INR, CBC q72h - Will continue to monitor for any signs/symptoms of bleeding and will follow up with PT/INR in the a.m.    Agnes Lawrence, PharmD PGY1 Pharmacy Resident

## 2019-03-24 NOTE — Progress Notes (Signed)
Patient ID: CAIUS SILBERNAGEL, male   DOB: 1950/09/22, 68 y.o.   MRN: [id number] S:No events overnight O:BP (!) 95/55   Pulse (!) 48   Temp 97.6 F (36.4 C) (Oral)   Resp (!) 0   Ht 6\' 4"  (1.93 m)   Wt 110.7 kg   SpO2 96%   BMI 29.70 kg/m   Intake/Output Summary (Last 24 hours) at 03/24/2019 1244 Last data filed at 03/24/2019 0345 Gross per 24 hour  Intake 180 ml  Output --  Net 180 ml   Intake/Output: I/O last 3 completed shifts: In: 420 [P.O.:420] Out: -   Intake/Output this shift:  No intake/output data recorded. Weight change: 0.907 kg Physical exam: unable to complete due to COVID + status.  In order to preserve PPE equipment and to minimize exposure to providers.  Notes from other caregivers reviewed   Recent Labs  Lab 03/18/19 0458 03/19/19 0500 03/20/19 0355 03/21/19 0522 03/22/19 0500 03/23/19 0402 03/24/19 0357  NA 143 142 142 139 139 136 138  K 4.3 3.7 4.4 4.2 4.5 4.5 4.3  CL 100 100 99 99 100 97* 99  CO2 21* 22 22 24 23 22 22   GLUCOSE 260* 213* 366* 230* 282* 409* 282*  BUN 116* 94* 122* 54* 84* 67* 93*  CREATININE 11.35* 9.15* 10.91* 5.96* 7.75* 6.57* 7.60*  ALBUMIN 2.5* 2.7* 2.5* 2.6* 2.5* 2.6* 2.6*  CALCIUM 8.6* 8.8* 8.4* 8.4* 8.2* 8.0* 8.3*  PHOS 9.7* 7.0* 8.6* 5.9* 6.6* 6.0* 6.8*   Liver Function Tests: Recent Labs  Lab 03/22/19 0500 03/23/19 0402 03/24/19 0357  ALBUMIN 2.5* 2.6* 2.6*   No results for input(s): LIPASE, AMYLASE in the last 168 hours. No results for input(s): AMMONIA in the last 168 hours. CBC: Recent Labs  Lab 03/20/19 0355 03/21/19 0522 03/22/19 0500 03/23/19 0402 03/24/19 0358  WBC 5.4 3.5* 3.4* 4.1 5.9  NEUTROABS 4.8 3.1 2.8 3.6 5.3  HGB 9.6* 9.4* 9.1* 9.3* 9.4*  HCT 29.0* 28.6* 27.4* 28.9* 27.6*  MCV 107.4* 105.5* 107.5* 109.1* 104.5*  PLT 123* 89* 64* 70* 72*   Cardiac Enzymes: No results for input(s): CKTOTAL, CKMB, CKMBINDEX, TROPONINI in the last 168 hours. CBG: Recent Labs  Lab 03/23/19 1148  03/23/19 1633 03/23/19 2124 03/24/19 0734 03/24/19 1145  GLUCAP 407* 278* 177* 229* 262*    Iron Studies:  Recent Labs    03/24/19 0358  FERRITIN 2,464*   Studies/Results: No results found. [name] albuterol  2 puff Inhalation Q6H  . atorvastatin  10 mg Oral Daily  . Chlorhexidine Gluconate Cloth  6 each Topical Q0600  . Chlorhexidine Gluconate Cloth  6 each Topical Q0600  . Chlorhexidine Gluconate Cloth  6 each Topical Q0600  . feeding supplement (PRO-STAT SUGAR FREE 64)  30 mL Oral BID  . insulin aspart  0-9 Units Subcutaneous TID WC  . insulin glargine  10 Units Subcutaneous Daily  . mouth rinse  15 mL Mouth Rinse BID  . methylPREDNISolone (SOLU-MEDROL) injection  40 mg Intravenous Q12H  . midodrine  5 mg Oral Q M,W,F-HD  . multivitamin  1 tablet Oral QHS  . sevelamer carbonate  4,800 mg Oral TID WC  . Warfarin - Pharmacist Dosing Inpatient   Does not apply q1800    BMET    Component Value Date/Time   NA 138 03/24/2019 0357   K 4.3 03/24/2019 0357   CL 99 03/24/2019 0357   CO2 22 03/24/2019 0357   GLUCOSE 282 (H) 03/24/2019 9892  BUN 93 (H) 03/24/2019 0357   CREATININE 7.60 (H) 03/24/2019 0357   CREATININE 4.43 (H) 09/29/2014 1422   CALCIUM 8.3 (L) 03/24/2019 0357   GFRNONAA 7 (L) 03/24/2019 0357   GFRNONAA 13 (L) 09/29/2014 1422   GFRAA 8 (L) 03/24/2019 0357   GFRAA 15 (L) 09/29/2014 1422   CBC    Component Value Date/Time   WBC 5.9 03/24/2019 0358   RBC 2.64 (L) 03/24/2019 0358   HGB 9.4 (L) 03/24/2019 0358   HCT 27.6 (L) 03/24/2019 0358   HCT 27.7 (L) 03/17/2019 1208   PLT 72 (L) 03/24/2019 0358   MCV 104.5 (H) 03/24/2019 0358   MCH 35.6 (H) 03/24/2019 0358   MCHC 34.1 03/24/2019 0358   RDW 13.6 03/24/2019 0358   LYMPHSABS 0.1 (L) 03/24/2019 0358   MONOABS 0.2 03/24/2019 0358   EOSABS 0.0 03/24/2019 0358   BASOSABS 0.0 03/24/2019 0358    Outpt HD:MWF [facility] 4h 450/800 98kg 2/2 bath AVF Hep none - parsabiv 7.5mg  tiw  - low UF 0.2, 0.8  recently   CXR 12/10 - no edema, RLL infiltrate  Assessment/Plan:  1. COVID + PNA- admitted 03/14/19. S/p remdesivir, abx, decadron per primary svc 2. ESRD- continue with HD qMWF as tolerated 3. Chronic hypotension- cont with midodrine 4. Bradycardia- poor candidate for pacemaker per Cardiology consult. Midodrine was held to see if this would improve HR however he became hypotensive with HD and ended early.  Will resume midodrine and see how he does.   5. Chronic hypotension-  6. DM type 2- per primary 7. Anemia of CKD stage 5- started on aranesp 40 mcg on 03/19/19 8. MBD- renal diet and binders 9. EOL- currently DNR, poor overall prognosis given multiple comorbidities.  Donetta Potts, MD [employer] [phone]

## 2019-03-24 NOTE — Progress Notes (Addendum)
PROGRESS NOTE  [name] KNL:[id number] DOB: July 05, 1950 DOA: 03/13/2019 PCP: [name], MD   LOS: 10 days   Brief narrative: [name] is an 68 y.o. male medical history significant for ESRD on hemodialysis, atrial fibrillation on warfarin, chronic hypotension on midodrine, legal blindness. He was brought to the hospital because of generalized weakness and tested positive for corona virus infection.  Assessment/Plan:  Principal Problem:   COVID-19 virus infection Active Problems:   ESRD (end stage renal disease) on dialysis (HCC)   Hypotension of hemodialysis   Thrombocytopenia (HCC)   DM type 2 (diabetes mellitus, type 2) (HCC)   Bilateral blindness   Generalized weakness  COVID-19 pneumonia: Completed IV remdesivir on 03/18/2019. IV dexamethasone to be completed on 03/26/2019. Completed IV ceftriaxone and Rocephin.  On room air  COVID-19 Labs  Recent Labs    03/22/19 0500 03/23/19 0402 03/24/19 0358  FERRITIN 2,117* 2,345* 2,464*    Lab Results  Component Value Date   SARSCOV2NAA POSITIVE (A) 03/15/2019   Significant sinus bradycardia: Asymptomatic.  Seen by cardiology and patient is a poor candidate for pacemaker.  Last heart rate of 56 bpm.  Chronic hypotension: He is on midodrine.  Dose has been decreased due to bradycardia.  On Solu-Medrol.  Latest blood pressure of 107/61  ESRD: Nephrology on board for hemodialysis/ultrafiltration.  Supratherapeutic INR/paroxysmal atrial fibrillation: He is on warfarin for long-term anticoagulation. INR is still supratherapeutic.  Continue to hold warfarin for Coumadin coagulopathy and monitor INR.  Continue Protonix.  Pharmacy to manage.  INR of 4.4 today.  Thrombocytopenia: Platelet count  of 72,000 from 70,000.   Continue to monitor.  No evidence of of bleeding at this time.  Debility/deconditioning: PT and OT recommend placement SNF.  Patient came from an assisted living facility.  VTE Prophylaxis:  Warfarin  Code Status: DNR  Family Communication:  None today.  Spoke with the patient's son yesterday  Disposition Plan: Skilled nursing facility when bed available.  Patient would need hemodialysis as outpatient.  Social worker on board.    Consultants:  Cardiology  Nephrology  Procedures:  hemodialysis  Antibiotics: Anti-infectives (From admission, onward)   Start     Dose/Rate Route Frequency Ordered Stop   03/15/19 1000  remdesivir 100 mg in sodium chloride 0.9 % 100 mL IVPB     100 mg 200 mL/hr over 30 Minutes Intravenous Daily 03/14/19 0800 03/18/19 1735   03/14/19 1100  azithromycin (ZITHROMAX) 500 mg in sodium chloride 0.9 % 250 mL IVPB  Status:  Discontinued     500 mg 250 mL/hr over 60 Minutes Intravenous Every 24 hours 03/14/19 0811 03/16/19 1218   03/14/19 1000  cefTRIAXone (ROCEPHIN) 1 g in sodium chloride 0.9 % 100 mL IVPB     1 g 200 mL/hr over 30 Minutes Intravenous Every 24 hours 03/14/19 0811 03/18/19 1153   03/14/19 0830  remdesivir 200 mg in sodium chloride 0.9% 250 mL IVPB     200 mg 580 mL/hr over 30 Minutes Intravenous Once 03/14/19 0800 03/14/19 0944      Subjective: Today, patient states that he did not feel well and has generalized weakness and pain.  Denies increasing shortness of breath nausea vomiting or abdominal pain.  Objective: Vitals:   03/23/19 2134 03/24/19 0605  BP: (!) 91/47 (!) 76/44  Pulse: (!) 56 (!) 52  Resp: 18 12  Temp:  98.2 F (36.8 C)  SpO2: 100% 100%    Intake/Output Summary (Last 24 hours) at 03/24/2019  [place] filed at 03/23/2019 2348 Gross per 24 hour  Intake 120 ml  Output --  Net 120 ml   Filed Weights   03/22/19 1230 03/22/19 1526 03/24/19 0605  Weight: 109.8 kg 108 kg 110.7 kg   Body mass index is 29.7 kg/m.   Physical Exam: General: Thinly built, alert awake and communicative.  HENT: Normocephalic, legally blind.  No scleral pallor or icterus noted.  Chest: Diminished breath sounds  bilaterally , no crackles or wheezes noted CVS: S1 &S2 heard. No murmur.  Regular rate and rhythm. Abdomen: Soft, nontender, nondistended.  Bowel sounds are heard.  Extremities: No cyanosis, clubbing or edema.  Transmetatarsal amputation on the right foot.  Left upper arm fistula Psych: Alert, awake and communicative. CNS:  No cranial nerve deficits.  Moving all extremities.   Skin: Sacral decubitus ulceration stage II  Data Review: I have personally reviewed the following laboratory data and studies,  CBC: Recent Labs  Lab 03/20/19 0355 03/21/19 0522 03/22/19 0500 03/23/19 0402 03/24/19 0358  WBC 5.4 3.5* 3.4* 4.1 5.9  NEUTROABS 4.8 3.1 2.8 3.6 5.3  HGB 9.6* 9.4* 9.1* 9.3* 9.4*  HCT 29.0* 28.6* 27.4* 28.9* 27.6*  MCV 107.4* 105.5* 107.5* 109.1* 104.5*  PLT 123* 89* 64* 70* 72*   Basic Metabolic Panel: Recent Labs  Lab 03/17/19 1114 03/18/19 0458 03/20/19 0355 03/21/19 0522 03/22/19 0500 03/23/19 0402 03/24/19 0357  NA  --  143 142 139 139 136 138  K  --  4.3 4.4 4.2 4.5 4.5 4.3  CL  --  100 99 99 100 97* 99  CO2  --  21* 22 24 23 22 22   GLUCOSE  --  260* 366* 230* 282* 409* 282*  BUN  --  116* 122* 54* 84* 67* 93*  CREATININE  --  11.35* 10.91* 5.96* 7.75* 6.57* 7.60*  CALCIUM  --  8.6* 8.4* 8.4* 8.2* 8.0* 8.3*  MG 2.4 2.5*  --   --   --   --   --   PHOS  --  9.7* 8.6* 5.9* 6.6* 6.0* 6.8*   Liver Function Tests: Recent Labs  Lab 03/20/19 0355 03/21/19 0522 03/22/19 0500 03/23/19 0402 03/24/19 0357  ALBUMIN 2.5* 2.6* 2.5* 2.6* 2.6*   No results for input(s): LIPASE, AMYLASE in the last 168 hours. No results for input(s): AMMONIA in the last 168 hours. Cardiac Enzymes: No results for input(s): CKTOTAL, CKMB, CKMBINDEX, TROPONINI in the last 168 hours. BNP (last 3 results) No results for input(s): BNP in the last 8760 hours.  ProBNP (last 3 results) No results for input(s): PROBNP in the last 8760 hours.  CBG: Recent Labs  Lab 03/22/19 1647  03/23/19 0812 03/23/19 1148 03/23/19 1633 03/23/19 2124  GLUCAP 148* 395* 407* 278* 177*   Recent Results (from the past 240 hour(s))  Culture, blood (routine x 2)     Status: None   Collection Time: 03/14/19  8:57 AM   Specimen: BLOOD  Result Value Ref Range Status   Specimen Description   Final    BLOOD RIGHT ANTECUBITAL Performed at [facility] [address], [place], [place] [address]    Special Requests   Final    BOTTLES DRAWN AEROBIC AND ANAEROBIC Blood Culture adequate volume Performed at [facility] [address], [place], [place] [address]    Culture   Final    NO GROWTH 5 DAYS Performed at [facility], [place],  [place] [address]    Report Status 03/19/2019 FINAL  Final  Culture, blood (routine x 2)     Status: None   Collection Time: 03/14/19  8:58 AM   Specimen: BLOOD  Result Value Ref Range Status   Specimen Description   Final    BLOOD BLOOD RIGHT FOREARM Performed at [facility] [address], [place], [place] [address]    Special Requests   Final    BOTTLES DRAWN AEROBIC AND ANAEROBIC Blood Culture adequate volume Performed at [facility] [address], [place], [place] [address]    Culture   Final    NO GROWTH 5 DAYS Performed at [facility], [place] [address] [place], [place] [address]    Report Status 03/19/2019 FINAL  Final  MRSA PCR Screening     Status: None   Collection Time: 03/15/19  3:08 AM   Specimen: Nasopharyngeal  Result Value Ref Range Status   MRSA by PCR NEGATIVE NEGATIVE Final    Comment:        The GeneXpert MRSA Assay (FDA approved for NASAL specimens only), is one component of a comprehensive MRSA colonization surveillance program. It is not intended to diagnose MRSA infection nor to guide or monitor treatment for MRSA infections. Performed at [facility], [place] [address], [place], [place] [address]    SARS CORONAVIRUS 2 (TAT 6-24 HRS) Nasopharyngeal Nasopharyngeal Swab     Status: Abnormal   Collection Time: 03/15/19  6:01 AM   Specimen: Nasopharyngeal Swab  Result Value Ref Range Status   SARS Coronavirus 2 POSITIVE (A) NEGATIVE Final    Comment: RESULT CALLED TO, READ BACK BY AND VERIFIED WITH: RN JOHN BRIDGES [phone] FCP (NOTE) SARS-CoV-2 target nucleic acids are DETECTED. The SARS-CoV-2 RNA is generally detectable in upper and lower respiratory specimens during the acute phase of infection. Positive results are indicative of the presence of SARS-CoV-2 RNA. Clinical correlation with patient history and other diagnostic information is  necessary to determine patient infection status. Positive results do not rule out bacterial infection or co-infection with other viruses.  The expected result is Negative. Fact Sheet for Patients: SugarRoll.be Fact Sheet for Healthcare Providers: https://www.woods-mathews.com/ This test is not yet approved or cleared by the [place] FDA and  has been authorized for detection and/or diagnosis of SARS-CoV-2 by FDA under an Emergency Use Authorization (EUA). This EUA will remain  in effect (meaning this test can be used) for the dur ation of the COVID-19 declaration under Section 564(b)(1) of the Act, 21 U.S.C. section 360bbb-3(b)(1), unless the authorization is terminated or revoked sooner. Performed at [facility], [place] [address], [place],  [address]      Studies: No results found.  Scheduled Meds: . albuterol  2 puff Inhalation Q6H  . atorvastatin  10 mg Oral Daily  . Chlorhexidine Gluconate Cloth  6 each Topical Q0600  . Chlorhexidine Gluconate Cloth  6 each Topical Q0600  . Chlorhexidine Gluconate Cloth  6 each Topical Q0600  . feeding supplement (PRO-STAT SUGAR FREE 64)  30 mL Oral BID  . insulin aspart  0-9 Units Subcutaneous TID WC  . insulin glargine  10 Units  Subcutaneous Daily  . mouth rinse  15 mL Mouth Rinse BID  . methylPREDNISolone (SOLU-MEDROL) injection  40 mg Intravenous Q12H  . midodrine  5 mg Oral Q M,W,F-HD  . multivitamin  1 tablet Oral QHS  . pantoprazole  40 mg Oral Daily  .  sevelamer carbonate  4,800 mg Oral TID WC  . Warfarin - Pharmacist Dosing Inpatient   Does not apply q1800    Continuous Infusions: . sodium chloride 1,000 mL (03/14/19 0851)     Flora Lipps, MD  [facility] 03/24/2019

## 2019-03-25 LAB — CBC WITH DIFFERENTIAL/PLATELET
Abs Immature Granulocytes: 0.08 10*3/uL — ABNORMAL HIGH (ref 0.00–0.07)
Basophils Absolute: 0 10*3/uL (ref 0.0–0.1)
Basophils Relative: 0 %
Eosinophils Absolute: 0 10*3/uL (ref 0.0–0.5)
Eosinophils Relative: 0 %
HCT: 26.7 % — ABNORMAL LOW (ref 39.0–52.0)
Hemoglobin: 9 g/dL — ABNORMAL LOW (ref 13.0–17.0)
Immature Granulocytes: 2 %
Lymphocytes Relative: 3 %
Lymphs Abs: 0.1 10*3/uL — ABNORMAL LOW (ref 0.7–4.0)
MCH: 35.6 pg — ABNORMAL HIGH (ref 26.0–34.0)
MCHC: 33.7 g/dL (ref 30.0–36.0)
MCV: 105.5 fL — ABNORMAL HIGH (ref 80.0–100.0)
Monocytes Absolute: 0.2 10*3/uL (ref 0.1–1.0)
Monocytes Relative: 4 %
Neutro Abs: 4.5 10*3/uL (ref 1.7–7.7)
Neutrophils Relative %: 91 %
Platelets: 53 10*3/uL — ABNORMAL LOW (ref 150–400)
RBC: 2.53 MIL/uL — ABNORMAL LOW (ref 4.22–5.81)
RDW: 13.6 % (ref 11.5–15.5)
WBC: 4.9 10*3/uL (ref 4.0–10.5)
nRBC: 0 % (ref 0.0–0.2)

## 2019-03-25 LAB — PROTIME-INR
INR: 4.2 (ref 0.8–1.2)
Prothrombin Time: 40.4 seconds — ABNORMAL HIGH (ref 11.4–15.2)

## 2019-03-25 LAB — RENAL FUNCTION PANEL
Albumin: 2.6 g/dL — ABNORMAL LOW (ref 3.5–5.0)
Anion gap: 20 — ABNORMAL HIGH (ref 5–15)
BUN: 118 mg/dL — ABNORMAL HIGH (ref 8–23)
CO2: 21 mmol/L — ABNORMAL LOW (ref 22–32)
Calcium: 8.3 mg/dL — ABNORMAL LOW (ref 8.9–10.3)
Chloride: 99 mmol/L (ref 98–111)
Creatinine, Ser: 9.04 mg/dL — ABNORMAL HIGH (ref 0.61–1.24)
GFR calc Af Amer: 6 mL/min — ABNORMAL LOW (ref >60)
GFR calc non Af Amer: 5 mL/min — ABNORMAL LOW (ref >60)
Glucose, Bld: 361 mg/dL — ABNORMAL HIGH (ref 70–99)
Phosphorus: 7.3 mg/dL — ABNORMAL HIGH (ref 2.5–4.6)
Potassium: 4.4 mmol/L (ref 3.5–5.1)
Sodium: 140 mmol/L (ref 135–145)

## 2019-03-25 LAB — GLUCOSE, CAPILLARY
Glucose-Capillary: 292 mg/dL — ABNORMAL HIGH (ref 70–99)
Glucose-Capillary: 174 mg/dL — ABNORMAL HIGH (ref 70–99)
Glucose-Capillary: 212 mg/dL — ABNORMAL HIGH (ref 70–99)

## 2019-03-25 LAB — FERRITIN: Ferritin: 2174 ng/mL — ABNORMAL HIGH (ref 24–336)

## 2019-03-25 MED ORDER — NEPRO/CARBSTEADY PO LIQD: 237.0000 mL | Freq: Two times a day (BID) | ORAL | Status: DC

## 2019-03-25 MED ORDER — MIDODRINE HCL 5 MG PO TABS
ORAL_TABLET | ORAL | Status: AC
  Filled 2019-03-25: qty 1

## 2019-03-25 NOTE — Progress Notes (Signed)
[facility] Progress Note   Dialysis Orders: MWF [facility] 4h 450/800 98kg 2/2 bath AVF Hep none - parsabiv 7.5mg  tiw  - low UF 0.2, 0.8 recently  CXR 12/10 - no edema, RLL infiltrate  Assessment/Plan:  1. COVID + PNA- admitted 03/14/19. S/p remdesivir 12/14 , abx, decadron  12/18 s/o rocephin - on room air 2. ESRD- continue with HD qMWF as tolerated K 4.4 BUN creeping up - in part due to weekend but also due to chronic shortened treatments due to high inpatient  volume 3. Chronic hypotension- cont with midodrine/ on solumedrol 4. Bradycardia- poor candidate for pacemaker per Cardiology consult. Midodrine was held to see if this would improve HR however he became hypotensive with HD and ended early.  Have resumed midodrine and see how he does.   5. DM type 2- per primary- hyperglycemia 6. Anemia of CKD stage 5- started on aranesp 40 mcg on 03/19/19 Hgb 9  7. MBD- renal diet and binders 8. EOL-/deconditioning/  currently DNR, poor overall prognosis given multiple comorbidities.[facility] has offered bed but transportation issues per SW note therefore offer will likely be recinded. 9. Nutrition alb 2.6- has prostat - add nepro 10. Afib  With ^^ INR  11. Thrombocytopenia - no heparin HD   Myriam Jacobson, PA-C [facility] Beeper [phone] 03/25/2019,1:01 PM  LOS: 11 days   Subjective:   No new issues  Objective Vitals:   03/24/19 2128 03/25/19 0409 03/25/19 0735 03/25/19 0848  BP: (!) 102/58 (!) 95/49 108/73   Pulse: (!) 46 (!) 52 (!) 56   Resp:  16 15   Temp: 98.4 F (36.9 C) 98.1 F (36.7 C) 98.3 F (36.8 C)   TempSrc: Oral Oral Oral   SpO2: 100% 100% 96% 100%  Weight:      Height:       Physical Exam Physical exam: unable to complete due to COVID + status.In order to preserve PPE equipment and to minimize exposure to providers.Notes from other caregivers reviewed   Additional Objective  Labs: Basic Metabolic  Panel: Recent Labs  Lab 03/23/19 0402 03/24/19 0357 03/25/19 0257  NA 136 138 140  K 4.5 4.3 4.4  CL 97* 99 99  CO2 22 22 21*  GLUCOSE 409* 282* 361*  BUN 67* 93* 118*  CREATININE 6.57* 7.60* 9.04*  CALCIUM 8.0* 8.3* 8.3*  PHOS 6.0* 6.8* 7.3*   Liver Function Tests: Recent Labs  Lab 03/23/19 0402 03/24/19 0357 03/25/19 0257  ALBUMIN 2.6* 2.6* 2.6*   No results for input(s): LIPASE, AMYLASE in the last 168 hours. CBC: Recent Labs  Lab 03/21/19 0522 03/22/19 0500 03/23/19 0402 03/24/19 0358 03/25/19 0257  WBC 3.5* 3.4* 4.1 5.9 4.9  NEUTROABS 3.1 2.8 3.6 5.3 4.5  HGB 9.4* 9.1* 9.3* 9.4* 9.0*  HCT 28.6* 27.4* 28.9* 27.6* 26.7*  MCV 105.5* 107.5* 109.1* 104.5* 105.5*  PLT 89* 64* 70* 72* 53*   Blood Culture    Component Value Date/Time   SDES  03/14/2019 0858    BLOOD BLOOD RIGHT FOREARM Performed at [facility], [place] [address] [place], [place] [address]    SPECREQUEST  03/14/2019 [phone]    BOTTLES DRAWN AEROBIC AND ANAEROBIC Blood Culture adequate volume Performed at [facility] [address] [place], [place] [address]    CULT  03/14/2019 6384    NO GROWTH 5 DAYS Performed at [facility] [address] [place], [place] [address]  REPTSTATUS 03/19/2019 FINAL 03/14/2019 0858    Cardiac Enzymes: No results for input(s): CKTOTAL, CKMB, CKMBINDEX, TROPONINI in the last 168 hours. CBG: Recent Labs  Lab 03/24/19 0734 03/24/19 1145 03/24/19 1618 03/24/19 2132 03/25/19 1132  GLUCAP 229* 262* 308* 339* 292*   Iron Studies:  Recent Labs    03/25/19 0257  FERRITIN 2,174*   Lab Results  Component Value Date   INR 4.2 (HH) 03/25/2019   INR 4.4 (HH) 03/24/2019   INR 4.8 ([place]) 03/23/2019   Studies/Results: No results found. Medications: . sodium chloride 1,000 mL (03/14/19 0851)   . albuterol  2 puff Inhalation BID  . atorvastatin  10 mg Oral Daily  . Chlorhexidine Gluconate Cloth  6 each Topical  Q0600  . Chlorhexidine Gluconate Cloth  6 each Topical Q0600  . Chlorhexidine Gluconate Cloth  6 each Topical Q0600  . feeding supplement (PRO-STAT SUGAR FREE 64)  30 mL Oral BID  . insulin aspart  0-9 Units Subcutaneous TID WC  . insulin glargine  10 Units Subcutaneous Daily  . mouth rinse  15 mL Mouth Rinse BID  . methylPREDNISolone (SOLU-MEDROL) injection  40 mg Intravenous Q12H  . midodrine      . midodrine  5 mg Oral Q M,W,F-HD  . multivitamin  1 tablet Oral QHS  . sevelamer carbonate  4,800 mg Oral TID WC  . Warfarin - Pharmacist Dosing Inpatient   Does not apply [phone]

## 2019-03-25 NOTE — Care Management (Signed)
Per [facility] pt has been offered a bed by [facility].  CM contacted liaison.  Facility can not transport pt to Saturday outpt HD treatments, however facility would be able to transport during the week.  Facility will not allow an outside transport service including [facility] to transport their pts.  Facility will only transport pt within [place] for outpt HD.  CM confirmed with renal coordinator that the only [facility] covid accepting HD center has the schedule of TTS -  [facility] (pt was already going to this center for HD PTA).   [facility] will need to rescind bed offer.

## 2019-03-25 NOTE — Progress Notes (Signed)
ANTICOAGULATION CONSULT NOTE - Follow-Up  Pharmacy Consult for Warfarin Indication: atrial fibrillation  Patient Measurements: Height: 6\' 4"  (193 cm) Weight: 244 lb (110.7 kg) IBW/kg (Calculated) : 86.8  Vital Signs: Temp: 98.3 F (36.8 C) (12/21 0735) Temp Source: Oral (12/21 0735) BP: 108/73 (12/21 0735) Pulse Rate: 56 (12/21 0735)  Labs: Recent Labs    03/23/19 0402 03/24/19 0357 03/24/19 0358 03/25/19 0257  HGB 9.3*  --  9.4* 9.0*  HCT 28.9*  --  27.6* 26.7*  PLT 70*  --  72* 53*  LABPROT 45.1*  --  41.7* 40.4*  INR 4.8*  --  4.4* 4.2*  CREATININE 6.57* 7.60*  --  9.04*    Estimated Creatinine Clearance: 10.5 mL/min (A) (by C-G formula based on SCr of 9.04 mg/dL (H)).   Assessment: 68 yo [name] presented on 03/13/2019 with weakness found to be positive for COVID-19. Patient has a history of slow Afib and was evaluated by cardiology on 02/15/2018 and recommended to begin anticoagulation but the patient did not follow up. Pharmacy has been consulted to dose warfarin for Afib. CHAD2-VASc score of 3.   INR today is still supratherapeutic at 4.2   Goal of Therapy:  INR 2-3 Monitor platelets by anticoagulation protocol: Yes   Plan:  - Continue to hold Warfarin dose today - Daily PT/INR, CBC q72h  - Increase lantus / taper steroids?  Thank you Anette Guarneri, PharmD [phone]

## 2019-03-25 NOTE — Progress Notes (Signed)
PROGRESS NOTE  [name] MGQ:[id number] DOB: May 29, 1950 DOA: 03/13/2019 PCP: [name], MD   LOS: 11 days   Brief narrative: [name] is an 68 y.o. male medical history significant for ESRD on hemodialysis, atrial fibrillation on warfarin, chronic hypotension on midodrine, legal blindness. He was brought to the hospital because of generalized weakness and tested positive for corona virus infection.  Assessment/Plan:  Principal Problem:   COVID-19 virus infection Active Problems:   ESRD (end stage renal disease) on dialysis (HCC)   Hypotension of hemodialysis   Thrombocytopenia (HCC)   DM type 2 (diabetes mellitus, type 2) (HCC)   Bilateral blindness   Generalized weakness  COVID-19 pneumonia: Completed IV remdesivir on 03/18/2019. IV dexamethasone to be completed on 03/26/2019. Completed IV ceftriaxone and Rocephin.  On room air  COVID-19 Labs  Recent Labs    03/23/19 0402 03/24/19 0358 03/25/19 0257  FERRITIN 2,345* 2,464* 2,174*    Lab Results  Component Value Date   SARSCOV2NAA POSITIVE (A) 03/15/2019   Significant sinus bradycardia: Asymptomatic.  Seen by cardiology and patient is a poor candidate for pacemaker.  Last heart rate of 56 bpm.  Chronic hypotension: He is on midodrine.  Dose has been decreased due to bradycardia.  On Solu-Medrol.    ESRD: Nephrology on board for hemodialysis/ultrafiltration.  Supratherapeutic INR/paroxysmal atrial fibrillation: He is on warfarin for long-term anticoagulation. INR is still supratherapeutic.  Continue to hold warfarin for Coumadin coagulopathy and monitor INR.  Pharmacy on board.  Continue Protonix.    INR of 4.2 today.  Thrombocytopenia: Platelet count  of 53,000 from 72,000.   Continue to monitor.  No evidence of of bleeding at this time.  Debility/deconditioning: PT and OT recommend SNF.  Patient originally from assisted living facility.  VTE Prophylaxis: Warfarin  Code Status: DNR  Family  Communication:  None today.   Disposition Plan: Skilled nursing facility when bed available.  Patient would need hemodialysis as outpatient.  Social worker on board for arrangement.   Consultants:  Cardiology  Nephrology  Procedures:  hemodialysis  Antibiotics: Anti-infectives (From admission, onward)   Start     Dose/Rate Route Frequency Ordered Stop   03/15/19 1000  remdesivir 100 mg in sodium chloride 0.9 % 100 mL IVPB     100 mg 200 mL/hr over 30 Minutes Intravenous Daily 03/14/19 0800 03/18/19 1735   03/14/19 1100  azithromycin (ZITHROMAX) 500 mg in sodium chloride 0.9 % 250 mL IVPB  Status:  Discontinued     500 mg 250 mL/hr over 60 Minutes Intravenous Every 24 hours 03/14/19 0811 03/16/19 1218   03/14/19 1000  cefTRIAXone (ROCEPHIN) 1 g in sodium chloride 0.9 % 100 mL IVPB     1 g 200 mL/hr over 30 Minutes Intravenous Every 24 hours 03/14/19 0811 03/18/19 1153   03/14/19 0830  remdesivir 200 mg in sodium chloride 0.9% 250 mL IVPB     200 mg 580 mL/hr over 30 Minutes Intravenous Once 03/14/19 0800 03/14/19 0944     Subjective: Today, denies interval complaints but has generalized weakness body ache.  Denies increasing shortness of breath, nausea, vomiting or abdominal pain.  Objective: Vitals:   03/25/19 0735 03/25/19 0848  BP: 108/73   Pulse: (!) 56   Resp: 15   Temp: 98.3 F (36.8 C)   SpO2: 96% 100%    Intake/Output Summary (Last 24 hours) at 03/25/2019 1150 Last data filed at 03/25/2019 0440 Gross per 24 hour  Intake 300 ml  Output 0  ml  Net 300 ml   Filed Weights   03/22/19 1230 03/22/19 1526 03/24/19 0605  Weight: 109.8 kg 108 kg 110.7 kg   Body mass index is 29.7 kg/m.   Physical Exam: General: Thinly built, not in obvious distress HENT: Legally blind no scleral pallor or icterus noted. Oral mucosa is moist.  Chest:   Diminished breath sounds bilaterally. No crackles or wheezes.  CVS: S1 &S2 heard. No murmur.  Regular rate and  rhythm. Abdomen: Soft, nontender, nondistended.  Bowel sounds are heard.  Liver is not palpable, no abdominal mass palpated Extremities: Transmetatarsal amputation on the right foot.  Left upper arm fistula. Psych: Alert, awake and communicative. CNS:  No cranial nerve deficits.  Moves all extremities. Skin: Sacral decubitus ulceration stage II.  Data Review: I have personally reviewed the following laboratory data and studies,  CBC: Recent Labs  Lab 03/21/19 0522 03/22/19 0500 03/23/19 0402 03/24/19 0358 03/25/19 0257  WBC 3.5* 3.4* 4.1 5.9 4.9  NEUTROABS 3.1 2.8 3.6 5.3 4.5  HGB 9.4* 9.1* 9.3* 9.4* 9.0*  HCT 28.6* 27.4* 28.9* 27.6* 26.7*  MCV 105.5* 107.5* 109.1* 104.5* 105.5*  PLT 89* 64* 70* 72* 53*   Basic Metabolic Panel: Recent Labs  Lab 03/21/19 0522 03/22/19 0500 03/23/19 0402 03/24/19 0357 03/25/19 0257  NA 139 139 136 138 140  K 4.2 4.5 4.5 4.3 4.4  CL 99 100 97* 99 99  CO2 24 23 22 22  21*  GLUCOSE 230* 282* 409* 282* 361*  BUN 54* 84* 67* 93* 118*  CREATININE 5.96* 7.75* 6.57* 7.60* 9.04*  CALCIUM 8.4* 8.2* 8.0* 8.3* 8.3*  PHOS 5.9* 6.6* 6.0* 6.8* 7.3*   Liver Function Tests: Recent Labs  Lab 03/21/19 0522 03/22/19 0500 03/23/19 0402 03/24/19 0357 03/25/19 0257  ALBUMIN 2.6* 2.5* 2.6* 2.6* 2.6*   No results for input(s): LIPASE, AMYLASE in the last 168 hours. No results for input(s): AMMONIA in the last 168 hours. Cardiac Enzymes: No results for input(s): CKTOTAL, CKMB, CKMBINDEX, TROPONINI in the last 168 hours. BNP (last 3 results) No results for input(s): BNP in the last 8760 hours.  ProBNP (last 3 results) No results for input(s): PROBNP in the last 8760 hours.  CBG: Recent Labs  Lab 03/24/19 0734 03/24/19 1145 03/24/19 1618 03/24/19 2132 03/25/19 1132  GLUCAP 229* 262* 308* 339* 292*   No results found for this or any previous visit (from the past 240 hour(s)).   Studies: No results found.  Scheduled Meds: . albuterol  2  puff Inhalation BID  . atorvastatin  10 mg Oral Daily  . Chlorhexidine Gluconate Cloth  6 each Topical Q0600  . Chlorhexidine Gluconate Cloth  6 each Topical Q0600  . Chlorhexidine Gluconate Cloth  6 each Topical Q0600  . feeding supplement (PRO-STAT SUGAR FREE 64)  30 mL Oral BID  . insulin aspart  0-9 Units Subcutaneous TID WC  . insulin glargine  10 Units Subcutaneous Daily  . mouth rinse  15 mL Mouth Rinse BID  . methylPREDNISolone (SOLU-MEDROL) injection  40 mg Intravenous Q12H  . midodrine      . midodrine  5 mg Oral Q M,W,F-HD  . multivitamin  1 tablet Oral QHS  . sevelamer carbonate  4,800 mg Oral TID WC  . Warfarin - Pharmacist Dosing Inpatient   Does not apply q1800    Continuous Infusions: . sodium chloride 1,000 mL (03/14/19 0851)     Flora Lipps, MD  [facility] 03/25/2019

## 2019-03-26 LAB — GLUCOSE, CAPILLARY
Glucose-Capillary: 279 mg/dL — ABNORMAL HIGH (ref 70–99)
Glucose-Capillary: 224 mg/dL — ABNORMAL HIGH (ref 70–99)
Glucose-Capillary: 271 mg/dL — ABNORMAL HIGH (ref 70–99)
Glucose-Capillary: 226 mg/dL — ABNORMAL HIGH (ref 70–99)
Glucose-Capillary: 129 mg/dL — ABNORMAL HIGH (ref 70–99)

## 2019-03-26 LAB — PROTIME-INR
INR: 4.4 (ref 0.8–1.2)
Prothrombin Time: 42.2 seconds — ABNORMAL HIGH (ref 11.4–15.2)

## 2019-03-26 LAB — CBC WITH DIFFERENTIAL/PLATELET
Abs Immature Granulocytes: 0.11 10*3/uL — ABNORMAL HIGH (ref 0.00–0.07)
Basophils Absolute: 0 10*3/uL (ref 0.0–0.1)
Basophils Relative: 0 %
Eosinophils Absolute: 0 10*3/uL (ref 0.0–0.5)
Eosinophils Relative: 0 %
HCT: 26.2 % — ABNORMAL LOW (ref 39.0–52.0)
Hemoglobin: 9.1 g/dL — ABNORMAL LOW (ref 13.0–17.0)
Immature Granulocytes: 2 %
Lymphocytes Relative: 3 %
Lymphs Abs: 0.2 10*3/uL — ABNORMAL LOW (ref 0.7–4.0)
MCH: 36.4 pg — ABNORMAL HIGH (ref 26.0–34.0)
MCHC: 34.7 g/dL (ref 30.0–36.0)
MCV: 104.8 fL — ABNORMAL HIGH (ref 80.0–100.0)
Monocytes Absolute: 0.3 10*3/uL (ref 0.1–1.0)
Monocytes Relative: 5 %
Neutro Abs: 5.2 10*3/uL (ref 1.7–7.7)
Neutrophils Relative %: 90 %
Platelets: 59 10*3/uL — ABNORMAL LOW (ref 150–400)
RBC: 2.5 MIL/uL — ABNORMAL LOW (ref 4.22–5.81)
RDW: 13.8 % (ref 11.5–15.5)
WBC: 5.7 10*3/uL (ref 4.0–10.5)
nRBC: 0 % (ref 0.0–0.2)

## 2019-03-26 LAB — RENAL FUNCTION PANEL
Albumin: 2.6 g/dL — ABNORMAL LOW (ref 3.5–5.0)
Anion gap: 18 — ABNORMAL HIGH (ref 5–15)
BUN: 95 mg/dL — ABNORMAL HIGH (ref 8–23)
CO2: 24 mmol/L (ref 22–32)
Calcium: 8.4 mg/dL — ABNORMAL LOW (ref 8.9–10.3)
Chloride: 96 mmol/L — ABNORMAL LOW (ref 98–111)
Creatinine, Ser: 7.49 mg/dL — ABNORMAL HIGH (ref 0.61–1.24)
GFR calc Af Amer: 8 mL/min — ABNORMAL LOW (ref >60)
GFR calc non Af Amer: 7 mL/min — ABNORMAL LOW (ref >60)
Glucose, Bld: 240 mg/dL — ABNORMAL HIGH (ref 70–99)
Phosphorus: 6.1 mg/dL — ABNORMAL HIGH (ref 2.5–4.6)
Potassium: 4 mmol/L (ref 3.5–5.1)
Sodium: 138 mmol/L (ref 135–145)

## 2019-03-26 LAB — FERRITIN: Ferritin: 2133 ng/mL — ABNORMAL HIGH (ref 24–336)

## 2019-03-26 MED ORDER — BENZONATATE 100 MG PO CAPS: 100.0000 mg | ORAL_CAPSULE | Freq: Three times a day (TID) | ORAL | Status: DC | PRN

## 2019-03-26 MED ORDER — DARBEPOETIN ALFA 40 MCG/0.4ML IJ SOSY: 40.0000 ug | PREFILLED_SYRINGE | INTRAMUSCULAR | Status: DC

## 2019-03-26 MED ORDER — CHLORHEXIDINE GLUCONATE CLOTH 2 % EX PADS
6.0000 | MEDICATED_PAD | Freq: Every day | CUTANEOUS | Status: DC
  Administered 2019-03-26 – 2019-03-28 (×3): 6 via TOPICAL

## 2019-03-26 NOTE — Progress Notes (Signed)
Occupational Therapy Treatment Patient Details Name: [name] MRN: [id number] DOB: March 12, 1951 Today's Date: 03/26/2019    History of present illness 67 yo admitted from [facility] with weakness and COVID +. Pt with sacral wound. PMhx: ESRD, bil blindness, hypotension on midodrine, DM, HLD, prostate CA, falls   OT comments  Pt with slow progress towards OT goals, requires some encouragement to participate in therapy session but is ultimately agreeable. Pt requiring mod-maxA+2 for all aspect of mobility today. Pt tolerating standing trials via [place] (x3 sit<>stand), able to achieve full upright standing position on 3rd trial with maxA+2. Pt with some incontinence upon return to supine requiring totalA for pericare at bed level. O2 sats maintained >90% on RA; noted pt with soft BP, initially 87/84 supine, 75/58 (64) seated EOB, back up to 80s/70s post initial stand at [facility], pt denies dizziness throughout. Continue to recommend SNF level therapies at time of discharge. Will follow while acutely admitted.    Follow Up Recommendations  SNF;Supervision/Assistance - 24 hour    Equipment Recommendations  (defer to next venue)          Precautions / Restrictions Precautions Precautions: Fall Precaution Comments: blind Restrictions Weight Bearing Restrictions: No       Mobility Bed Mobility Overal bed mobility: Needs Assistance Bed Mobility: Rolling;Sidelying to Sit;Sit to Sidelying Rolling: Mod assist Sidelying to sit: +2 for physical assistance;+2 for safety/equipment;HOB elevated;Mod assist   Sit to supine: Max assist;+2 for safety/equipment;+2 for physical assistance   General bed mobility comments: pt able to assist with bringing LEs over EOB today, assist to elevate trunk to sitting; assist for trunk/LE support when returning to supine; pt rolling to L/R for pericare   Transfers Overall transfer level: Needs assistance Equipment used: Ambulation equipment  used Transfers: Sit to/from Stand Sit to Stand: Max assist;+2 physical assistance;+2 safety/equipment;From elevated surface         General transfer comment: peformed sit<>stand trials at [facility], x3 trials. pt initially not able to achieve full upright postion but with elevated EOB pt able to do so given +50maxA for boosting/steadying, VCs for hand placement and for safety with transition to sitting    Balance Overall balance assessment: Needs assistance Sitting-balance support: Feet supported;Bilateral upper extremity supported;Single extremity supported Sitting balance-Leahy Scale: Fair Sitting balance - Comments: close minguard assist though often reliant on at least single UE support    Standing balance support: Bilateral upper extremity supported Standing balance-Leahy Scale: Poor Standing balance comment: +2 assist for balance                           ADL either performed or assessed with clinical judgement   ADL Overall ADL's : Needs assistance/impaired Eating/Feeding: Set up;Sitting                           Toileting- Clothing Manipulation and Hygiene: Total assistance;+2 for physical assistance;+2 for safety/equipment;Bed level Toileting - Clothing Manipulation Details (indicate cue type and reason): noted pt had BM once return to supine, totalA for pericare     Functional mobility during ADLs: Maximal assistance;+2 for physical assistance;+2 for safety/equipment(sit<>stand at [facility]) General ADL Comments: pt continues to have weakness, decreased activity tolerance and poor sitting/standing balance     Vision       Perception     Praxis      Cognition Arousal/Alertness: Awake/alert Behavior During Therapy: WFL for tasks assessed/performed;Agitated Overall  Cognitive Status: Impaired/Different from baseline Area of Impairment: Safety/judgement                         Safety/Judgement: Decreased awareness of deficits     General  Comments: easily agitated even with words of encouragement/direction, but was agreeable to working with therapies        Exercises     Shoulder Instructions       General Comments pt with soft BP, initially 87/84 supine, 75/58 seated EOB, back up to 80s/70s post initial stand at [facility], pt denies dizziness     Pertinent Vitals/ Pain       Pain Assessment: Faces Faces Pain Scale: Hurts little more Pain Location: neck and generalized Pain Descriptors / Indicators: Aching;Discomfort;Sore Pain Intervention(s): Limited activity within patient's tolerance;Monitored during session;Repositioned  Home Living                                          Prior Functioning/Environment              Frequency  Min 2X/week        Progress Toward Goals  OT Goals(current goals can now be found in the care plan section)  Progress towards OT goals: Progressing toward goals  Acute Rehab OT Goals Patient Stated Goal: return to ALF OT Goal Formulation: With patient Time For Goal Achievement: 04/02/19 Potential to Achieve Goals: Good ADL Goals Pt Will Perform Grooming: with supervision;sitting Pt Will Perform Lower Body Bathing: with min assist;sitting/lateral leans;sit to/from stand Pt Will Perform Lower Body Dressing: with min assist;sitting/lateral leans;sit to/from stand Pt Will Transfer to Toilet: with min assist;stand pivot transfer;ambulating Pt Will Perform Toileting - Clothing Manipulation and hygiene: with min assist;sit to/from stand;sitting/lateral leans Additional ADL Goal #1: Pt will demonstrate anticipatory awareness during ADL task.  Plan Discharge plan remains appropriate;Frequency remains appropriate    Co-evaluation    PT/OT/SLP Co-Evaluation/Treatment: Yes Reason for Co-Treatment: For patient/therapist safety;To address functional/ADL transfers   OT goals addressed during session: ADL's and self-care      AM-PAC OT "6 Clicks" Daily Activity      Outcome Measure   Help from another person eating meals?: A Little Help from another person taking care of personal grooming?: A Little Help from another person toileting, which includes using toliet, bedpan, or urinal?: Total Help from another person bathing (including washing, rinsing, drying)?: A Lot Help from another person to put on and taking off regular upper body clothing?: A Little Help from another person to put on and taking off regular lower body clothing?: Total 6 Click Score: 13    End of Session Equipment Utilized During Treatment: Gait belt  OT Visit Diagnosis: Unsteadiness on feet (R26.81);Muscle weakness (generalized) (M62.81);Other symptoms and signs involving cognitive function   Activity Tolerance Patient tolerated treatment well   Patient Left in bed;with call bell/phone within reach   Nurse Communication Mobility status        Time: 9381-0175 OT Time Calculation (min): 26 min  Charges: OT General Charges $OT Visit: 1 Visit OT Treatments $Self Care/Home Management : 8-22 mins  Lou Cal, OT Supplemental Rehabilitation Services Pager [phone] Office [place] 03/26/2019, 3:45 PM

## 2019-03-26 NOTE — Progress Notes (Signed)
Nutrition Follow-up  RD working remotely.  DOCUMENTATION CODES:   Not applicable  INTERVENTION:   - Continue Pro-stat 30 ml po BID, each supplement provides 100 kcal and 15 grams of protein  - Continue Magic cup BID with lunch and dinner meals meals, each supplement provides 290 kcal and 9 grams of protein  - Continue renal MVI daily  - Nepro Shake po BID, each supplement provides 425 kcal and 19 grams protein  - Encourage adequate PO intake  NUTRITION DIAGNOSIS:   Increased nutrient needs related to acute illness, chronic illness (COVID-19, ESRD on HD) as evidenced by estimated needs.  Ongoing, being addressed via oral nutrition supplements  GOAL:   Patient will meet greater than or equal to 90% of their needs  Progressing  MONITOR:   PO intake, Supplement acceptance, Labs, Weight trends, I & O's, Skin  REASON FOR ASSESSMENT:   Low Braden    ASSESSMENT:   68 year old male who presented to the ED on 12/09 with weakness, COVID-19 positive. PMH of ESRD on HD, blindness, T2DM, HLD, HTN, prostate cancer.  Per Palliative note on 12/17, pt reports "he would not want any forms of artificial feeding if unable to take in food or drink by mouth."  Per MD note today, pt is medically stable for discharge.  Last HD was yesterday with 1000 ml net UF. Current weight is above EDW of 98 kg.  Unable to reach pt via phone call to room. Will continue with current supplement regimen at this time.  Pt refusing Nepro supplements per MAR.  Meal Completion: 0-100% (averaging 50%)  Medications reviewed and include: Nepro BID, Pro-stat BID, SSI, Lantus 10 units daily, solu-medrol, rena-vit, Renvela, warfarin  Labs reviewed: phosphorus 6.1, hemoglobin 9.1 CBG's: 174-271 x 24 hours  Diet Order:   Diet Order            Diet renal with fluid restriction Fluid restriction: 1200 mL Fluid; Room service appropriate? Yes; Fluid consistency: Thin  Diet effective now               EDUCATION NEEDS:   Education needs have been addressed  Skin:  Skin Assessment: Reviewed RN Assessment (MASD groin, sacrum)  Last BM:  03/26/19  Height:   Ht Readings from Last 1 Encounters:  03/15/19 6\' 4"  (1.93 m)    Weight:   Wt Readings from Last 1 Encounters:  03/24/19 110.7 kg    Ideal Body Weight:  91.8 kg  BMI:  Body mass index is 29.7 kg/m.  Estimated Nutritional Needs:   Kcal:  2400-2600  Protein:  110-130 grams  Fluid:  UOP + 1000 ml    Gaynell Face, MS, RD, LDN Inpatient Clinical Dietitian Pager: [phone] Weekend/After Hours: [phone]

## 2019-03-26 NOTE — Progress Notes (Signed)
PROGRESS NOTE  Phyllip Claw Conde XBD:[id number] DOB: May 04, 1950 DOA: 03/13/2019 PCP: [name], MD   LOS: 12 days   Brief narrative: [name] is an 68 y.o. male medical history significant for ESRD on hemodialysis, atrial fibrillation on warfarin, chronic hypotension on midodrine, legal blindness. He was brought to the hospital because of generalized weakness and tested positive for corona virus infection.  Assessment/Plan:  Principal Problem:   COVID-19 virus infection Active Problems:   ESRD (end stage renal disease) on dialysis (HCC)   Hypotension of hemodialysis   Thrombocytopenia (HCC)   DM type 2 (diabetes mellitus, type 2) (HCC)   Bilateral blindness   Generalized weakness  COVID-19 pneumonia: Completed IV remdesivir on 03/18/2019. IV solumedrol  to be completed on 03/26/2019. Completed IV ceftriaxone and Rocephin.  On room air  [place]    03/24/19 0358 03/25/19 0257 03/26/19 0500  FERRITIN 2,464* 2,174* 2,133*    Lab Results  Component Value Date   SARSCOV2NAA POSITIVE (A) 03/15/2019   Significant sinus bradycardia: Asymptomatic.  Seen by cardiology and patient is a poor candidate for pacemaker.  Heart rate has improved with decreasing dose of midodrine.  With nodal blockers.  Chronic hypotension: He is on midodrine.  Dose has been decreased due to bradycardia.  On Solu-Medrol.  Blood pressure stable.  ESRD: Nephrology on board for hemodialysis/ultrafiltration.  Supratherapeutic INR/paroxysmal atrial fibrillation: He is on warfarin for long-term anticoagulation. INR is still supratherapeutic.  Continue to hold warfarin for Coumadin coagulopathy and monitor INR.  Pharmacy on board.  Continue Protonix.    INR of 4.4 today.  Thrombocytopenia: Platelet count  of 59,000 from 53,000.   Continue to monitor.  No evidence of of bleeding at this time.  Debility/deconditioning: PT and OT recommend SNF.  Patient used to be at assisted living  facility.  VTE Prophylaxis: Warfarin  Code Status: DNR  Family Communication:  Spoke with the patient's son Mr [name] on the phone and updated him about the clinical condition of the patient  Disposition Plan: Skilled nursing facility when bed available.  Patient is medically stable for disposition.  Patient would need hemodialysis as outpatient.  Social worker on board for arrangement.   Consultants:  Cardiology  Nephrology  Procedures:  hemodialysis  Antibiotics: Anti-infectives (From admission, onward)   Start     Dose/Rate Route Frequency Ordered Stop   03/15/19 1000  remdesivir 100 mg in sodium chloride 0.9 % 100 mL IVPB     100 mg 200 mL/hr over 30 Minutes Intravenous Daily 03/14/19 0800 03/18/19 1735   03/14/19 1100  azithromycin (ZITHROMAX) 500 mg in sodium chloride 0.9 % 250 mL IVPB  Status:  Discontinued     500 mg 250 mL/hr over 60 Minutes Intravenous Every 24 hours 03/14/19 0811 03/16/19 1218   03/14/19 1000  cefTRIAXone (ROCEPHIN) 1 g in sodium chloride 0.9 % 100 mL IVPB     1 g 200 mL/hr over 30 Minutes Intravenous Every 24 hours 03/14/19 0811 03/18/19 1153   03/14/19 0830  remdesivir 200 mg in sodium chloride 0.9% 250 mL IVPB     200 mg 580 mL/hr over 30 Minutes Intravenous Once 03/14/19 0800 03/14/19 0944     Subjective: Today, patient denies any shortness of breath cough fever.  Complains of neck pain, generalized weakness and body pain.  Could not sleep well.  Objective: Vitals:   03/26/19 0754 03/26/19 0818  BP:    Pulse:    Resp:  Temp: 98.7 F (37.1 C)   SpO2:  95%    Intake/Output Summary (Last 24 hours) at 03/26/2019 1035 Last data filed at 03/25/2019 1910 Gross per 24 hour  Intake 240 ml  Output 1000 ml  Net -760 ml   Filed Weights   03/22/19 1230 03/22/19 1526 03/24/19 0605  Weight: 109.8 kg 108 kg 110.7 kg   Body mass index is 29.7 kg/m.   Physical Exam: General: Thinly built, not in obvious distress HENT: Legally blind,.  Oral mucosa is moist.  Chest:   Diminished breath sounds bilaterally. No crackles or wheezes.  CVS: S1 &S2 heard. No murmur.  Regular rate and rhythm. Abdomen: Soft, nontender, nondistended.  Bowel sounds are heard.  Liver is not palpable, no abdominal mass palpated Extremities: Transmetatarsal amputation on the right foot.  Left upper arm fistula. Psych: Alert, awake and communicative. CNS:  No cranial nerve deficits.  Moves all extremities. Skin: Sacral decubitus ulceration stage II.  Data Review: I have personally reviewed the following laboratory data and studies,  CBC: Recent Labs  Lab 03/22/19 0500 03/23/19 0402 03/24/19 0358 03/25/19 0257 03/26/19 0500  WBC 3.4* 4.1 5.9 4.9 5.7  NEUTROABS 2.8 3.6 5.3 4.5 5.2  HGB 9.1* 9.3* 9.4* 9.0* 9.1*  HCT 27.4* 28.9* 27.6* 26.7* 26.2*  MCV 107.5* 109.1* 104.5* 105.5* 104.8*  PLT 64* 70* 72* 53* 59*   Basic Metabolic Panel: Recent Labs  Lab 03/22/19 0500 03/23/19 0402 03/24/19 0357 03/25/19 0257 03/26/19 0500  NA 139 136 138 140 138  K 4.5 4.5 4.3 4.4 4.0  CL 100 97* 99 99 96*  CO2 23 22 22  21* 24  GLUCOSE 282* 409* 282* 361* 240*  BUN 84* 67* 93* 118* 95*  CREATININE 7.75* 6.57* 7.60* 9.04* 7.49*  CALCIUM 8.2* 8.0* 8.3* 8.3* 8.4*  PHOS 6.6* 6.0* 6.8* 7.3* 6.1*   Liver Function Tests: Recent Labs  Lab 03/22/19 0500 03/23/19 0402 03/24/19 0357 03/25/19 0257 03/26/19 0500  ALBUMIN 2.5* 2.6* 2.6* 2.6* 2.6*   No results for input(s): LIPASE, AMYLASE in the last 168 hours. No results for input(s): AMMONIA in the last 168 hours. Cardiac Enzymes: No results for input(s): CKTOTAL, CKMB, CKMBINDEX, TROPONINI in the last 168 hours. BNP (last 3 results) No results for input(s): BNP in the last 8760 hours.  ProBNP (last 3 results) No results for input(s): PROBNP in the last 8760 hours.  CBG: Recent Labs  Lab 03/24/19 2132 03/25/19 1132 03/25/19 1849 03/25/19 2207 03/26/19 0753  GLUCAP 339* 292* 174* 212* 224*    No results found for this or any previous visit (from the past 240 hour(s)).   Studies: No results found.  Scheduled Meds: . albuterol  2 puff Inhalation BID  . atorvastatin  10 mg Oral Daily  . Chlorhexidine Gluconate Cloth  6 each Topical Q0600  . feeding supplement (NEPRO CARB STEADY)  237 mL Oral BID BM  . feeding supplement (PRO-STAT SUGAR FREE 64)  30 mL Oral BID  . insulin aspart  0-9 Units Subcutaneous TID WC  . insulin glargine  10 Units Subcutaneous Daily  . mouth rinse  15 mL Mouth Rinse BID  . methylPREDNISolone (SOLU-MEDROL) injection  40 mg Intravenous Q12H  . midodrine  5 mg Oral Q M,W,F-HD  . multivitamin  1 tablet Oral QHS  . sevelamer carbonate  4,800 mg Oral TID WC  . Warfarin - Pharmacist Dosing Inpatient   Does not apply q1800    Continuous Infusions: . sodium chloride 1,000 mL (  03/14/19 0851)     Flora Lipps, MD  [facility] 03/26/2019

## 2019-03-26 NOTE — Progress Notes (Signed)
CRITICAL VALUE STICKER  CRITICAL VALUE: INR 4.4  RECEIVER (on-site recipient of call): Kacen Mellinger S  DATE & TIME NOTIFIED: 03/26/19 0805  MESSENGER (representative from lab): Ainsley Spinner  MD NOTIFIED: Dr. Louanne Belton  TIME OF NOTIFICATION: 4536  RESPONSE: No new orders at this time

## 2019-03-26 NOTE — Progress Notes (Signed)
[facility] Progress Note   Dialysis Orders: MWF [facility] 4h 450/800 98kg 2/2 bath AVF Hep none - parsabiv 7.5mg  tiw  - low UF 0.2, 0.8 recently  CXR 12/10 - no edema, RLL infiltrate  Assessment/Plan:  1. COVID + PNA- admitted 03/14/19. S/p remdesivir 12/14 , abx, decadron  12/18 s/o rocephin - on room air 2. ESRD- continue with HD qMWF as tolerated K 4.0 BUN 118>95 and Cr only decreased from 8.3 to 7.49  after HD yesterday - would have expected a better drop  - evaluate for recirculation Wednesday on dialysis -  3. Chronic hypotension/volume- cont with midodrine/ on solumedrol - net UF 1 L Monday - 4. Bradycardia- poor candidate for pacemaker per Cardiology consult. Midodrine was held to see if this would improve HR however he became hypotensive with HD and ended early.  Have resumed midodrine  5. DM type 2- per primary- hyperglycemia 6. Anemia of CKD stage 5- started on aranesp 40 mcg on 03/19/19 Hgb 9.1 stable - will redorder 7. MBD- renal diet and binders P a little high - not his biggest problem 8. EOL-/deconditioning/  currently DNR, poor overall prognosis given multiple comorbidities.[facility] has offered bed but transportation issues per SW note therefore offer will likely be recinded. 9. Nutrition alb 2.6- has prostat - add nepro 10. Afib  With ^^ INR  11. Thrombocytopenia - no heparin HD [place], [facility] Beeper [phone] 03/26/2019,10:39 AM  LOS: 12 days   Subjective:   No new issues  Objective Vitals:   03/26/19 0100 03/26/19 0517 03/26/19 0754 03/26/19 0818  BP: 110/64     Pulse:      Resp: 16     Temp:  98.5 F (36.9 C) 98.7 F (37.1 C)   TempSrc:  Oral Oral   SpO2:    95%  Weight:      Height:       Physical Exam Physical exam: unable to complete due to COVID + status.In order to preserve PPE equipment and to minimize exposure to providers.Notes from other caregivers  reviewed   Additional Objective  Labs: Basic Metabolic Panel: Recent Labs  Lab 03/24/19 0357 03/25/19 0257 03/26/19 0500  NA 138 140 138  K 4.3 4.4 4.0  CL 99 99 96*  CO2 22 21* 24  GLUCOSE 282* 361* 240*  BUN 93* 118* 95*  CREATININE 7.60* 9.04* 7.49*  CALCIUM 8.3* 8.3* 8.4*  PHOS 6.8* 7.3* 6.1*   Liver Function Tests: Recent Labs  Lab 03/24/19 0357 03/25/19 0257 03/26/19 0500  ALBUMIN 2.6* 2.6* 2.6*   No results for input(s): LIPASE, AMYLASE in the last 168 hours. CBC: Recent Labs  Lab 03/22/19 0500 03/23/19 0402 03/24/19 0358 03/25/19 0257 03/26/19 0500  WBC 3.4* 4.1 5.9 4.9 5.7  NEUTROABS 2.8 3.6 5.3 4.5 5.2  HGB 9.1* 9.3* 9.4* 9.0* 9.1*  HCT 27.4* 28.9* 27.6* 26.7* 26.2*  MCV 107.5* 109.1* 104.5* 105.5* 104.8*  PLT 64* 70* 72* 53* 59*   Blood Culture    Component Value Date/Time   SDES  03/14/2019 0858    BLOOD BLOOD RIGHT FOREARM Performed at [facility], [place] [address] [place], [place] [address]    SPECREQUEST  03/14/2019 [id number]    BOTTLES DRAWN AEROBIC AND ANAEROBIC Blood Culture adequate volume Performed at [facility] [address] [place], [place] [address]    CULT  03/14/2019 0858    NO GROWTH 5 DAYS  Performed at [facility], [place] [address], [place], [place] [address]    REPTSTATUS 03/19/2019 FINAL 03/14/2019 0858    Cardiac Enzymes: No results for input(s): CKTOTAL, CKMB, CKMBINDEX, TROPONINI in the last 168 hours. CBG: Recent Labs  Lab 03/24/19 2132 03/25/19 1132 03/25/19 1849 03/25/19 2207 03/26/19 0753  GLUCAP 339* 292* 174* 212* 224*   Iron Studies:  Recent Labs    03/26/19 0500  FERRITIN 2,133*   Lab Results  Component Value Date   INR 4.4 (HH) 03/26/2019   INR 4.2 (HH) 03/25/2019   INR 4.4 ([place]) 03/24/2019   Studies/Results: No results found. Medications: . sodium chloride 1,000 mL (03/14/19 0851)   . albuterol  2 puff Inhalation BID  . atorvastatin  10 mg  Oral Daily  . Chlorhexidine Gluconate Cloth  6 each Topical Q0600  . feeding supplement (NEPRO CARB STEADY)  237 mL Oral BID BM  . feeding supplement (PRO-STAT SUGAR FREE 64)  30 mL Oral BID  . insulin aspart  0-9 Units Subcutaneous TID WC  . insulin glargine  10 Units Subcutaneous Daily  . mouth rinse  15 mL Mouth Rinse BID  . methylPREDNISolone (SOLU-MEDROL) injection  40 mg Intravenous Q12H  . midodrine  5 mg Oral Q M,W,F-HD  . multivitamin  1 tablet Oral QHS  . sevelamer carbonate  4,800 mg Oral TID WC  . Warfarin - Pharmacist Dosing Inpatient   Does not apply [phone]

## 2019-03-26 NOTE — Progress Notes (Signed)
Physical Therapy Treatment Patient Details Name: [name] MRN: [id number] DOB: 11/17/1950 Today's Date: 03/26/2019    History of Present Illness 68 yo admitted from [facility] with weakness and COVID +. Pt with sacral wound. PMhx: ESRD, bil blindness, hypotension on midodrine, DM, HLD, prostate CA, falls    PT Comments    Pt making slow progress towards his goals today. Pt pain is decreased enough for him to agree to attempt standing in [place] with therapy. Pt maxAx2 for coming to EoB. Once EoB pt reports no dizziness despite soft BP. Pt reports that is his normal. Pt requires maxAx2 for coming to standing in [place] x3. Pt unable to achieve fully upright on first attempt and is irritated with therapy for providing encouragement. Pt able to come to fully upright of subsequent attempts. With return to supine pt found to be soiled and requires modA for turning for pericare. Discharge plans remain appropriate at this time. PT will continue to follow acutely.    Follow Up Recommendations  Supervision for mobility/OOB;SNF     Equipment Recommendations  None recommended by PT    Recommendations for Other Services       Precautions / Restrictions Precautions Precautions: Fall Precaution Comments: blind Restrictions Weight Bearing Restrictions: No    Mobility  Bed Mobility Overal bed mobility: Needs Assistance Bed Mobility: Rolling;Sidelying to Sit;Sit to Sidelying Rolling: Mod assist Sidelying to sit: +2 for physical assistance;+2 for safety/equipment;HOB elevated;Mod assist   Sit to supine: Max assist;+2 for safety/equipment;+2 for physical assistance   General bed mobility comments: pt able to assist with bringing LEs over EOB today, assist to elevate trunk to sitting; assist for trunk/LE support when returning to supine; pt rolling to L/R for pericare   Transfers Overall transfer level: Needs assistance Equipment used: Ambulation equipment used Transfers: Sit  to/from Stand Sit to Stand: Max assist;+2 physical assistance;+2 safety/equipment;From elevated surface         General transfer comment: peformed sit<>stand trials at [facility], x3 trials. pt initially not able to achieve full upright postion but with elevated EOB pt able to do so given +5maxA for boosting/steadying, VCs for hand placement and for safety with transition to sitting        Balance Overall balance assessment: Needs assistance Sitting-balance support: Feet supported;Bilateral upper extremity supported;Single extremity supported Sitting balance-Leahy Scale: Fair Sitting balance - Comments: close minguard assist though often reliant on at least single UE support    Standing balance support: Bilateral upper extremity supported Standing balance-Leahy Scale: Poor Standing balance comment: +2 assist for balance                            Cognition Arousal/Alertness: Awake/alert Behavior During Therapy: WFL for tasks assessed/performed;Agitated Overall Cognitive Status: Impaired/Different from baseline Area of Impairment: Safety/judgement                         Safety/Judgement: Decreased awareness of deficits     General Comments: easily agitated even with words of encouragement/direction, but was agreeable to working with therapies         General Comments General comments (skin integrity, edema, etc.): pt with soft BP, initially 87/84 supine, 75/58 seated EOB, back up to 80s/70s post initial stand at [facility], pt denies dizziness       Pertinent Vitals/Pain Pain Assessment: Faces Faces Pain Scale: Hurts little more Pain Location: neck and generalized Pain Descriptors /  Indicators: Aching;Discomfort;Sore Pain Intervention(s): Limited activity within patient's tolerance;Monitored during session;Repositioned           PT Goals (current goals can now be found in the care plan section) Acute Rehab PT Goals Patient Stated Goal: return to ALF PT  Goal Formulation: With patient Time For Goal Achievement: 04/02/19 Potential to Achieve Goals: Fair Progress towards PT goals: Progressing toward goals    Frequency    Min 2X/week      PT Plan Current plan remains appropriate    Co-evaluation PT/OT/SLP Co-Evaluation/Treatment: Yes Reason for Co-Treatment: For patient/therapist safety PT goals addressed during session: Mobility/safety with mobility OT goals addressed during session: ADL's and self-care      AM-PAC PT "6 Clicks" Mobility   Outcome Measure  Help needed turning from your back to your side while in a flat bed without using bedrails?: A Lot Help needed moving from lying on your back to sitting on the side of a flat bed without using bedrails?: Total Help needed moving to and from a bed to a chair (including a wheelchair)?: Total Help needed standing up from a chair using your arms (e.g., wheelchair or bedside chair)?: Total Help needed to walk in hospital room?: Total Help needed climbing 3-5 steps with a railing? : Total 6 Click Score: 7    End of Session Equipment Utilized During Treatment: Gait belt Activity Tolerance: Patient limited by pain Patient left: in bed;with call bell/phone within reach;with bed alarm set;Other (comment) Nurse Communication: Mobility status PT Visit Diagnosis: Other abnormalities of gait and mobility (R26.89)     Time: 4098-1191 PT Time Calculation (min) (ACUTE ONLY): 26 min  Charges:  $Gait Training: 8-22 mins                     Alphonsa Brickle B. Migdalia Dk PT, DPT Acute Rehabilitation Services Pager [phone] Office [phone]    [place] 03/26/2019, 4:21 PM

## 2019-03-26 NOTE — Consult Note (Signed)
   [facility] CM Inpatient Consult   03/26/2019  [place] 09/11/1950 [id number]    Patientwasreviewed for long length of stay (LLOS), with 29% high risk score for unplanned readmissionand hospitalizations, asa benefit fromhis Medicare/ [employer] ACOplan.   Chart reviewedand MD brief narrative dated 03/25/19 states as: [name] an 68 y.o.malemedical history significant for ESRD on hemodialysis, atrial fibrillation on warfarin, chronic hypotension on midodrine, legal blindness. He was brought to the hospital because of generalized weakness and tested positive for corona virus infection.   Chart reviewrevealsthat patient is originally from assisted living facility ([facility]).  PT and OT recommend skilled nursing facility-SNF at discharge.  Per transition of care CM note, patient's facility is also recommending short term skilled nursing facility (SNF) rehab prior to returning back to ALF. Currently awaiting for SNF that can transport patient to Outpatient HD (hemodialysis).  His primary care provider is Dr. [name] with [facility].  Palliative care following patient this admission with referral placed to Outpatient palliative as agreed on by patient.   Ifthere are any changes in disposition plan, please make a referral to [facility] managementfor follow-upof needsas appropriate.   [facility] post acute RN coordinator will be made aware of patient's disposition to any facility covered by [facility] post discharge follow up of needs.   For questions and referral, please contact:  Shaakira Borrero A. Yani Coventry, BSN, RN-BC [facility] Liaison Cell: [phone]

## 2019-03-26 NOTE — Progress Notes (Signed)
ANTICOAGULATION CONSULT NOTE - Follow-Up  Pharmacy Consult for Warfarin Indication: atrial fibrillation   Assessment: 68 yo male presented on 03/13/2019 with weakness found to be positive for COVID-19. Patient has a history of slow Afib and was evaluated by cardiology on 02/15/2018 and recommended to begin anticoagulation but the patient did not follow up. Pharmacy has been consulted to dose warfarin for Afib. CHAD2-VASc score of 3.   INR today is still supratherapeutic  Goal of Therapy:  INR 2-3 Monitor platelets by anticoagulation protocol: Yes   Plan:  - Continue to hold Warfarin dose today - Daily PT/INR, CBC q72h  - Increase lantus / taper steroids?    Patient Measurements: Height: 6\' 4"  (193 cm) Weight: 244 lb (110.7 kg) IBW/kg (Calculated) : 86.8  Vital Signs: Temp: 98.7 F (37.1 C) (12/22 0754) Temp Source: Oral (12/22 0754) BP: 110/64 (12/22 0100)  Labs: Recent Labs    03/24/19 0357 03/24/19 0358 03/25/19 0257 03/26/19 0500  HGB  --  9.4* 9.0* 9.1*  HCT  --  27.6* 26.7* 26.2*  PLT  --  72* 53* 59*  LABPROT  --  41.7* 40.4* 42.2*  INR  --  4.4* 4.2* 4.4*  CREATININE 7.60*  --  9.04* 7.49*    Estimated Creatinine Clearance: 12.7 mL/min (A) (by C-G formula based on SCr of 7.49 mg/dL (H)).   Thank you Anette Guarneri, PharmD [phone]

## 2019-03-27 LAB — CBC WITH DIFFERENTIAL/PLATELET
Abs Immature Granulocytes: 0.16 10*3/uL — ABNORMAL HIGH (ref 0.00–0.07)
Basophils Absolute: 0 10*3/uL (ref 0.0–0.1)
Basophils Relative: 0 %
Eosinophils Absolute: 0 10*3/uL (ref 0.0–0.5)
Eosinophils Relative: 0 %
HCT: 27.5 % — ABNORMAL LOW (ref 39.0–52.0)
Hemoglobin: 9.2 g/dL — ABNORMAL LOW (ref 13.0–17.0)
Immature Granulocytes: 3 %
Lymphocytes Relative: 3 %
Lymphs Abs: 0.2 10*3/uL — ABNORMAL LOW (ref 0.7–4.0)
MCH: 35.4 pg — ABNORMAL HIGH (ref 26.0–34.0)
MCHC: 33.5 g/dL (ref 30.0–36.0)
MCV: 105.8 fL — ABNORMAL HIGH (ref 80.0–100.0)
Monocytes Absolute: 0.3 10*3/uL (ref 0.1–1.0)
Monocytes Relative: 4 %
Neutro Abs: 5.1 10*3/uL (ref 1.7–7.7)
Neutrophils Relative %: 90 %
Platelets: 60 10*3/uL — ABNORMAL LOW (ref 150–400)
RBC: 2.6 MIL/uL — ABNORMAL LOW (ref 4.22–5.81)
RDW: 13.9 % (ref 11.5–15.5)
WBC: 5.6 10*3/uL (ref 4.0–10.5)
nRBC: 0 % (ref 0.0–0.2)

## 2019-03-27 LAB — GLUCOSE, CAPILLARY
Glucose-Capillary: 131 mg/dL — ABNORMAL HIGH (ref 70–99)
Glucose-Capillary: 155 mg/dL — ABNORMAL HIGH (ref 70–99)
Glucose-Capillary: 152 mg/dL — ABNORMAL HIGH (ref 70–99)

## 2019-03-27 LAB — RENAL FUNCTION PANEL
Albumin: 2.7 g/dL — ABNORMAL LOW (ref 3.5–5.0)
Anion gap: 18 — ABNORMAL HIGH (ref 5–15)
BUN: 122 mg/dL — ABNORMAL HIGH (ref 8–23)
CO2: 24 mmol/L (ref 22–32)
Calcium: 8.5 mg/dL — ABNORMAL LOW (ref 8.9–10.3)
Chloride: 98 mmol/L (ref 98–111)
Creatinine, Ser: 9.02 mg/dL — ABNORMAL HIGH (ref 0.61–1.24)
GFR calc Af Amer: 6 mL/min — ABNORMAL LOW (ref >60)
GFR calc non Af Amer: 5 mL/min — ABNORMAL LOW (ref >60)
Glucose, Bld: 134 mg/dL — ABNORMAL HIGH (ref 70–99)
Phosphorus: 7.6 mg/dL — ABNORMAL HIGH (ref 2.5–4.6)
Potassium: 4.6 mmol/L (ref 3.5–5.1)
Sodium: 140 mmol/L (ref 135–145)

## 2019-03-27 LAB — PROTIME-INR
INR: 4 — ABNORMAL HIGH (ref 0.8–1.2)
Prothrombin Time: 39.2 seconds — ABNORMAL HIGH (ref 11.4–15.2)

## 2019-03-27 MED ORDER — CHLORHEXIDINE GLUCONATE CLOTH 2 % EX PADS: 6.0000 | MEDICATED_PAD | Freq: Every day | CUTANEOUS | Status: DC

## 2019-03-27 MED ORDER — SODIUM CHLORIDE 0.9 % IV BOLUS
250.0000 mL | Freq: Once | INTRAVENOUS | Status: AC
  Administered 2019-03-27: 250 mL via INTRAVENOUS

## 2019-03-27 NOTE — Progress Notes (Addendum)
ANTICOAGULATION CONSULT NOTE - Follow-Up  Pharmacy Consult for Warfarin Indication: atrial fibrillation   Assessment: 68 yo male presented on 03/13/2019 with weakness found to be positive for COVID-19. Patient has a history of slow Afib and was evaluated by cardiology on 02/15/2018 and recommended to begin anticoagulation but the patient did not follow up. Pharmacy has been consulted to dose warfarin for Afib. CHAD2-VASc score of 3.   INR today is still supra-therapeutic (slowly trending down) Only received 7.5 mg po x 2 doses 12/13 and 12/14  Goal of Therapy:  INR 2-3 Monitor platelets by anticoagulation protocol: Yes   Plan:  - Continue to hold Warfarin dose today - Daily PT/INR, CBC q72h   Patient Measurements: Height: 6\' 4"  (193 cm) Weight: 244 lb (110.7 kg) IBW/kg (Calculated) : 86.8  Vital Signs: Temp: 97.7 F (36.5 C) (12/23 0500) Temp Source: Oral (12/23 0500) BP: 104/61 (12/23 0400) Pulse Rate: 54 (12/23 0547)  Labs: Recent Labs    03/25/19 0257 03/26/19 0500 03/27/19 0500  HGB 9.0* 9.1* 9.2*  HCT 26.7* 26.2* 27.5*  PLT 53* 59* 60*  LABPROT 40.4* 42.2* 39.2*  INR 4.2* 4.4* 4.0*  CREATININE 9.04* 7.49* 9.02*    Estimated Creatinine Clearance: 10.5 mL/min (A) (by C-G formula based on SCr of 9.02 mg/dL (H)).   Thank you Anette Guarneri, PharmD [phone]

## 2019-03-27 NOTE — Progress Notes (Signed)
   03/27/19 2113  MEWS Score  Resp 14  ECG Heart Rate (!) 52  Pulse Rate (!) 53  BP (!) 71/40  SpO2 94 %  O2 Device Room Air  MEWS Score  MEWS RR 0  MEWS Pulse 0  MEWS Systolic 2  MEWS LOC 0  MEWS Temp 0  MEWS Score 2  MEWS Score Color Yellow  MEWS Assessment  Is this an acute change? No  Provider Notification  Provider Name/Title Kennon Holter, NP  Date Provider Notified 03/27/19  Time Provider Notified 2117  Notification Type Page  Notification Reason Other (Comment) (BP 79/51, HR 54. Recheck 71/40. Pt drowsy, denies dizziness )  Response See new orders  Date of Provider Response 03/27/19  Time of Provider Response 2136

## 2019-03-27 NOTE — Progress Notes (Signed)
PROGRESS NOTE  [name] CWC:[id number] DOB: November 04, 1950 DOA: 03/13/2019 PCP: [name], MD   LOS: 13 days   Brief narrative: [name] is an 68 y.o. male medical history significant for ESRD on hemodialysis, atrial fibrillation on warfarin, chronic hypotension on midodrine, legal blindness. He was brought to the hospital because of generalized weakness and tested positive for corona virus infection.  Assessment/Plan:  Principal Problem:   COVID-19 virus infection Active Problems:   ESRD (end stage renal disease) on dialysis (HCC)   Hypotension of hemodialysis   Thrombocytopenia (HCC)   DM type 2 (diabetes mellitus, type 2) (HCC)   Bilateral blindness   Generalized weakness  COVID-19 pneumonia: Completed IV remdesivir on 03/18/2019. IV solumedrol   completed on 03/26/2019. Will discontinue. Completed IV ceftriaxone and Rocephin.  On room air  COVID-19 Labs  Recent Labs    03/25/19 0257 03/26/19 0500  FERRITIN 2,174* 2,133*    Lab Results  Component Value Date   SARSCOV2NAA POSITIVE (A) 03/15/2019   Significant sinus bradycardia: Asymptomatic.  Seen by cardiology and patient is a poor candidate for pacemaker.  Heart rate has improved with decreasing dose of midodrine.  With nodal blockers.  Chronic hypotension: He is on midodrine.  Dose has been decreased due to bradycardia.  completed Solu-Medrol.  Blood pressure low but stable  ESRD: Nephrology on board for hemodialysis/ultrafiltration.  Supratherapeutic INR/paroxysmal atrial fibrillation: He is on warfarin for long-term anticoagulation. INR is still supratherapeutic.  Pharmacy on board.  Continue Protonix.    INR of 4.0 today.  Thrombocytopenia: Platelet count  of 60,000.   continue to monitor.  No evidence of of bleeding at this time.  Debility/deconditioning: PT and OT recommend SNF.  Patient used to be at assisted living facility.  VTE Prophylaxis: Warfarin  Code Status: DNR  Family  Communication:  None today.  Spoke with the patient's son Mr [name] on the phone and updated him about the clinical condition of the patient yesterday  Disposition Plan: Skilled nursing facility likely to [facility] tomorrow as per case management..  Patient is medically stable for disposition.    Consultants:  Cardiology  Nephrology  Procedures:  hemodialysis  Antibiotics: Anti-infectives (From admission, onward)   Start     Dose/Rate Route Frequency Ordered Stop   03/15/19 1000  remdesivir 100 mg in sodium chloride 0.9 % 100 mL IVPB     100 mg 200 mL/hr over 30 Minutes Intravenous Daily 03/14/19 0800 03/18/19 1735   03/14/19 1100  azithromycin (ZITHROMAX) 500 mg in sodium chloride 0.9 % 250 mL IVPB  Status:  Discontinued     500 mg 250 mL/hr over 60 Minutes Intravenous Every 24 hours 03/14/19 0811 03/16/19 1218   03/14/19 1000  cefTRIAXone (ROCEPHIN) 1 g in sodium chloride 0.9 % 100 mL IVPB     1 g 200 mL/hr over 30 Minutes Intravenous Every 24 hours 03/14/19 0811 03/18/19 1153   03/14/19 0830  remdesivir 200 mg in sodium chloride 0.9% 250 mL IVPB     200 mg 580 mL/hr over 30 Minutes Intravenous Once 03/14/19 0800 03/14/19 0944     Subjective: Today, complains of generalized nonspecific weakness and body pain.  Nuys any shortness of breath cough fever or chills.  Objective: Vitals:   03/27/19 1130 03/27/19 1150  BP: (!) 88/53 (!) 98/56  Pulse: (!) 55 (!) 56  Resp: 15 15  Temp:  97.9 F (36.6 C)  SpO2:  95%    Intake/Output Summary (Last 24  hours) at 03/27/2019 1234 Last data filed at 03/27/2019 1150 Gross per 24 hour  Intake 120 ml  Output 800 ml  Net -680 ml   Filed Weights   03/22/19 1230 03/22/19 1526 03/24/19 0605  Weight: 109.8 kg 108 kg 110.7 kg   Body mass index is 29.7 kg/m.   Physical Exam: General: Thinly built, not in obvious distress HENT: Legally blind,. Oral mucosa is moist.  Chest:   Diminished breath sounds bilaterally. No crackles or  wheezes.  CVS: S1 &S2 heard. No murmur.  Regular rate and rhythm. Abdomen: Soft, nontender, nondistended.  Bowel sounds are heard.  Liver is not palpable, no abdominal mass palpated Extremities: Transmetatarsal amputation on the right foot.  Left upper arm fistula. Psych: Alert, awake and communicative. CNS:  No cranial nerve deficits.  Moves all extremities. Skin: Sacral decubitus ulceration stage II.  Data Review: I have personally reviewed the following laboratory data and studies,  CBC: Recent Labs  Lab 03/23/19 0402 03/24/19 0358 03/25/19 0257 03/26/19 0500 03/27/19 0500  WBC 4.1 5.9 4.9 5.7 5.6  NEUTROABS 3.6 5.3 4.5 5.2 5.1  HGB 9.3* 9.4* 9.0* 9.1* 9.2*  HCT 28.9* 27.6* 26.7* 26.2* 27.5*  MCV 109.1* 104.5* 105.5* 104.8* 105.8*  PLT 70* 72* 53* 59* 60*   Basic Metabolic Panel: Recent Labs  Lab 03/23/19 0402 03/24/19 0357 03/25/19 0257 03/26/19 0500 03/27/19 0500  NA 136 138 140 138 140  K 4.5 4.3 4.4 4.0 4.6  CL 97* 99 99 96* 98  CO2 22 22 21* 24 24  GLUCOSE 409* 282* 361* 240* 134*  BUN 67* 93* 118* 95* 122*  CREATININE 6.57* 7.60* 9.04* 7.49* 9.02*  CALCIUM 8.0* 8.3* 8.3* 8.4* 8.5*  PHOS 6.0* 6.8* 7.3* 6.1* 7.6*   Liver Function Tests: Recent Labs  Lab 03/23/19 0402 03/24/19 0357 03/25/19 0257 03/26/19 0500 03/27/19 0500  ALBUMIN 2.6* 2.6* 2.6* 2.6* 2.7*   No results for input(s): LIPASE, AMYLASE in the last 168 hours. No results for input(s): AMMONIA in the last 168 hours. Cardiac Enzymes: No results for input(s): CKTOTAL, CKMB, CKMBINDEX, TROPONINI in the last 168 hours. BNP (last 3 results) No results for input(s): BNP in the last 8760 hours.  ProBNP (last 3 results) No results for input(s): PROBNP in the last 8760 hours.  CBG: Recent Labs  Lab 03/25/19 2207 03/26/19 0753 03/26/19 1144 03/26/19 1652 03/26/19 2117  GLUCAP 212* 224* 271* 226* 129*   No results found for this or any previous visit (from the past 240 hour(s)).    Studies: No results found.  Scheduled Meds: . albuterol  2 puff Inhalation BID  . atorvastatin  10 mg Oral Daily  . Chlorhexidine Gluconate Cloth  6 each Topical Q0600  . darbepoetin (ARANESP) injection - DIALYSIS  40 mcg Intravenous Q Wed-HD  . feeding supplement (NEPRO CARB STEADY)  237 mL Oral BID BM  . feeding supplement (PRO-STAT SUGAR FREE 64)  30 mL Oral BID  . insulin aspart  0-9 Units Subcutaneous TID WC  . insulin glargine  10 Units Subcutaneous Daily  . mouth rinse  15 mL Mouth Rinse BID  . methylPREDNISolone (SOLU-MEDROL) injection  40 mg Intravenous Q12H  . midodrine  5 mg Oral Q M,W,F-HD  . multivitamin  1 tablet Oral QHS  . sevelamer carbonate  4,800 mg Oral TID WC  . Warfarin - Pharmacist Dosing Inpatient   Does not apply q1800    Continuous Infusions: . sodium chloride 1,000 mL (03/14/19 0851)  Flora Lipps, MD  [facility] 03/27/2019

## 2019-03-27 NOTE — Progress Notes (Addendum)
[facility] Progress Note   Dialysis Orders: MWF [facility] 4h 450/800 98kg 2/2 bath AVF Hep none - parsabiv 7.5mg  tiw  - low UF 0.2, 0.8 recently  CXR 12/10 - no edema, RLL infiltrate  Assessment/Plan:  1. COVID + PNA- admitted 03/14/19. S/p remdesivir 12/14 , abx, decadron  12/18 s/o rocephin - on room air 2. ESRD- MWF prior to addmission K 4.6 today BUN 122 - Cr 9 - will need follow up evaluation of access at outpatient HD unit- net UF 800 ml - need wt for discharge but unfortunately bed scale broken - reassess at dialysis - unfortunately he has also been on MWF here and TTS schedule at [facility] -it is too long between treatments between Wed and Sunday. Have d/w SW and we will run him him first shift for abbreviated treatment because his SNF cannot arrange transportation by Thursday to outpatient HD a GO. 3. Chronic hypotension/volume- cont with midodrine/ on solumedrol - net UF 1 L Monday -and 800 ml Wed 4. Bradycardia- poor candidate for pacemaker per Cardiology consult. Midodrine was held to see if this would improve HR however he became hypotensive with HD and ended early.  Have resumed midodrine  5. DM type 2- per primary- hyperglycemia 6. Anemia of CKD stage 5- started on aranesp 40 mcg on 03/19/19 Hgb 9.2 stable - will redorder 7. MBD- renal diet and binders P a little high - not his biggest problem 8. EOL-/deconditioning/  currently DNR, poor overall prognosis given multiple comorbidities.Plan discharge to [facility] today 9. Nutrition alb 2.6- has prostat - add nepro 10. Afib  With ^^ INR - no heparin 11. Thrombocytopenia - no heparin HD    Myriam Jacobson, PA-C [facility] Beeper [phone] 03/27/2019,1:07 PM  LOS: 13 days   Subjective:   Possible discharge today - if he stays should have HD here tommorw  Objective Vitals:   03/27/19 1030 03/27/19 1100 03/27/19 1130 03/27/19 1150  BP: (!) 78/49 (!) 76/53 (!) 88/53 (!) 98/56   Pulse: (!) 57 (!) 54 (!) 55 (!) 56  Resp: 15 15 15 15   Temp:    97.9 F (36.6 C)  TempSrc:    Oral  SpO2:    95%  Weight:      Height:       Physical Exam Physical exam: unable to complete due to COVID + status.In order to preserve PPE equipment and to minimize exposure to providers.Notes from other caregivers reviewed   Additional Objective  Labs: Basic Metabolic Panel: Recent Labs  Lab 03/25/19 0257 03/26/19 0500 03/27/19 0500  NA 140 138 140  K 4.4 4.0 4.6  CL 99 96* 98  CO2 21* 24 24  GLUCOSE 361* 240* 134*  BUN 118* 95* 122*  CREATININE 9.04* 7.49* 9.02*  CALCIUM 8.3* 8.4* 8.5*  PHOS 7.3* 6.1* 7.6*   Liver Function Tests: Recent Labs  Lab 03/25/19 0257 03/26/19 0500 03/27/19 0500  ALBUMIN 2.6* 2.6* 2.7*   No results for input(s): LIPASE, AMYLASE in the last 168 hours. CBC: Recent Labs  Lab 03/23/19 0402 03/24/19 0358 03/25/19 0257 03/26/19 0500 03/27/19 0500  WBC 4.1 5.9 4.9 5.7 5.6  NEUTROABS 3.6 5.3 4.5 5.2 5.1  HGB 9.3* 9.4* 9.0* 9.1* 9.2*  HCT 28.9* 27.6* 26.7* 26.2* 27.5*  MCV 109.1* 104.5* 105.5* 104.8* 105.8*  PLT 70* 72* 53* 59* 60*   Blood Culture    Component Value Date/Time   SDES  03/14/2019 0858    BLOOD  BLOOD RIGHT FOREARM Performed at [facility] [address] [place], [place] [address]    SPECREQUEST  03/14/2019 [phone]    BOTTLES DRAWN AEROBIC AND ANAEROBIC Blood Culture adequate volume Performed at [facility] [address], [place], [place] [address]    CULT  03/14/2019 9747    NO GROWTH 5 DAYS Performed at [facility] [address] [place], [place] [address]    REPTSTATUS 03/19/2019 FINAL 03/14/2019 0858    Cardiac Enzymes: No results for input(s): CKTOTAL, CKMB, CKMBINDEX, TROPONINI in the last 168 hours. CBG: Recent Labs  Lab 03/25/19 2207 03/26/19 0753 03/26/19 1144 03/26/19 1652 03/26/19 2117  GLUCAP 212* 224* 271* 226* 129*   Iron Studies:   Recent Labs    03/26/19 0500  FERRITIN 2,133*   Lab Results  Component Value Date   INR 4.0 (H) 03/27/2019   INR 4.4 (HH) 03/26/2019   INR 4.2 (HH) 03/25/2019   Studies/Results: No results found. Medications: . sodium chloride 1,000 mL (03/14/19 0851)   . albuterol  2 puff Inhalation BID  . atorvastatin  10 mg Oral Daily  . Chlorhexidine Gluconate Cloth  6 each Topical Q0600  . darbepoetin (ARANESP) injection - DIALYSIS  40 mcg Intravenous Q Wed-HD  . feeding supplement (NEPRO CARB STEADY)  237 mL Oral BID BM  . feeding supplement (PRO-STAT SUGAR FREE 64)  30 mL Oral BID  . insulin aspart  0-9 Units Subcutaneous TID WC  . insulin glargine  10 Units Subcutaneous Daily  . mouth rinse  15 mL Mouth Rinse BID  . midodrine  5 mg Oral Q M,W,F-HD  . multivitamin  1 tablet Oral QHS  . sevelamer carbonate  4,800 mg Oral TID WC  . Warfarin - Pharmacist Dosing Inpatient   Does not apply [phone]

## 2019-03-27 NOTE — Care Management (Addendum)
[facility] confirms pt can be accepted  Pam confirmed with Emilie Rutter that pt has chair at 11:45 TTS, however for the next two weeks due to holidays instead of Sat pt will go on Sunday.  Regular HD schedule to resume after holiday. CM informed pts son that he has been accepted to [facility] - son is in agreement.  CM was unable to reach pt via phone as he is in HD today - day early on current schedule.    CM text paged nephrology to inform that pt will not get HD on Saturday this week and gain confirmation if pt can go without HD until Sunday since he is being dialyzed a day early.  Per Dr Justin Mend pt is safe to go from today until Sunday for next HD session.  Shelia with [facility] aware and plans to accept pt tomorrow.  Update Per Nephrology PA - pt will need to get a short session HD treatment tomorrow prior to discharge to set him up on the TTS schedule.

## 2019-03-27 NOTE — Progress Notes (Signed)
Patient's bed scale not working. Daily weight not measured at this time.

## 2019-03-28 LAB — PROTIME-INR
INR: 3.9 — ABNORMAL HIGH (ref 0.8–1.2)
Prothrombin Time: 38.4 seconds — ABNORMAL HIGH (ref 11.4–15.2)

## 2019-03-28 LAB — GLUCOSE, CAPILLARY
Glucose-Capillary: 73 mg/dL (ref 70–99)
Glucose-Capillary: 95 mg/dL (ref 70–99)
Glucose-Capillary: 132 mg/dL — ABNORMAL HIGH (ref 70–99)

## 2019-03-28 LAB — CBC WITH DIFFERENTIAL/PLATELET
Abs Immature Granulocytes: 0.12 10*3/uL — ABNORMAL HIGH (ref 0.00–0.07)
Basophils Absolute: 0 10*3/uL (ref 0.0–0.1)
Basophils Relative: 0 %
Eosinophils Absolute: 0 10*3/uL (ref 0.0–0.5)
Eosinophils Relative: 1 %
HCT: 27.1 % — ABNORMAL LOW (ref 39.0–52.0)
Hemoglobin: 9 g/dL — ABNORMAL LOW (ref 13.0–17.0)
Immature Granulocytes: 2 %
Lymphocytes Relative: 6 %
Lymphs Abs: 0.3 10*3/uL — ABNORMAL LOW (ref 0.7–4.0)
MCH: 35 pg — ABNORMAL HIGH (ref 26.0–34.0)
MCHC: 33.2 g/dL (ref 30.0–36.0)
MCV: 105.4 fL — ABNORMAL HIGH (ref 80.0–100.0)
Monocytes Absolute: 0.6 10*3/uL (ref 0.1–1.0)
Monocytes Relative: 10 %
Neutro Abs: 5 10*3/uL (ref 1.7–7.7)
Neutrophils Relative %: 81 %
Platelets: 72 10*3/uL — ABNORMAL LOW (ref 150–400)
RBC: 2.57 MIL/uL — ABNORMAL LOW (ref 4.22–5.81)
RDW: 14.1 % (ref 11.5–15.5)
WBC: 6 10*3/uL (ref 4.0–10.5)
nRBC: 0 % (ref 0.0–0.2)

## 2019-03-28 LAB — RENAL FUNCTION PANEL
Albumin: 2.4 g/dL — ABNORMAL LOW (ref 3.5–5.0)
Anion gap: 16 — ABNORMAL HIGH (ref 5–15)
BUN: 70 mg/dL — ABNORMAL HIGH (ref 8–23)
CO2: 26 mmol/L (ref 22–32)
Calcium: 8.3 mg/dL — ABNORMAL LOW (ref 8.9–10.3)
Chloride: 99 mmol/L (ref 98–111)
Creatinine, Ser: 6.15 mg/dL — ABNORMAL HIGH (ref 0.61–1.24)
GFR calc Af Amer: 10 mL/min — ABNORMAL LOW (ref >60)
GFR calc non Af Amer: 9 mL/min — ABNORMAL LOW (ref >60)
Glucose, Bld: 80 mg/dL (ref 70–99)
Phosphorus: 6.4 mg/dL — ABNORMAL HIGH (ref 2.5–4.6)
Potassium: 3.2 mmol/L — ABNORMAL LOW (ref 3.5–5.1)
Sodium: 141 mmol/L (ref 135–145)

## 2019-03-28 MED ORDER — WARFARIN SODIUM 1 MG PO TABS: 1.0000 mg | ORAL_TABLET | Freq: Every day | ORAL | Status: AC

## 2019-03-28 MED ORDER — DARBEPOETIN ALFA 40 MCG/0.4ML IJ SOSY
PREFILLED_SYRINGE | INTRAMUSCULAR | Status: AC
  Administered 2019-03-28: 40 ug via INTRAVENOUS
  Filled 2019-03-28: qty 0.4

## 2019-03-28 MED ORDER — MIDODRINE HCL 5 MG PO TABS: 5.0000 mg | ORAL_TABLET | Freq: Three times a day (TID) | ORAL | Status: AC

## 2019-03-28 MED ORDER — PENTAFLUOROPROP-TETRAFLUOROETH EX AERO: 1.0000 "application " | INHALATION_SPRAY | CUTANEOUS | Status: DC | PRN

## 2019-03-28 MED ORDER — LIDOCAINE-PRILOCAINE 2.5-2.5 % EX CREA: 1.0000 "application " | TOPICAL_CREAM | CUTANEOUS | Status: DC | PRN

## 2019-03-28 MED ORDER — ALBUTEROL SULFATE HFA 108 (90 BASE) MCG/ACT IN AERS: 2.0000 | INHALATION_SPRAY | Freq: Four times a day (QID) | RESPIRATORY_TRACT | Status: AC | PRN

## 2019-03-28 MED ORDER — LIDOCAINE HCL (PF) 1 % IJ SOLN: 5.0000 mL | INTRAMUSCULAR | Status: DC | PRN

## 2019-03-28 MED ORDER — NEPRO/CARBSTEADY PO LIQD: 237.0000 mL | Freq: Two times a day (BID) | ORAL | 0 refills | Status: AC

## 2019-03-28 MED ORDER — ONDANSETRON HCL 4 MG PO TABS: 4.0000 mg | ORAL_TABLET | Freq: Four times a day (QID) | ORAL | 0 refills | Status: AC | PRN

## 2019-03-28 MED ORDER — POTASSIUM CHLORIDE CRYS ER 20 MEQ PO TBCR
40.0000 meq | EXTENDED_RELEASE_TABLET | Freq: Once | ORAL | Status: AC
  Administered 2019-03-28: 40 meq via ORAL
  Filled 2019-03-28: qty 2

## 2019-03-28 MED ORDER — SODIUM CHLORIDE 0.9 % IV BOLUS
250.0000 mL | Freq: Once | INTRAVENOUS | Status: AC
  Administered 2019-03-28: 05:00:00 250 mL via INTRAVENOUS

## 2019-03-28 MED ORDER — PRO-STAT SUGAR FREE PO LIQD: 30.0000 mL | Freq: Two times a day (BID) | ORAL | 0 refills | Status: AC

## 2019-03-28 MED ORDER — SODIUM CHLORIDE 0.9 % IV SOLN: 100.0000 mL | INTRAVENOUS | Status: DC | PRN

## 2019-03-28 MED ORDER — ACETAMINOPHEN 325 MG PO TABS: 650.0000 mg | ORAL_TABLET | Freq: Four times a day (QID) | ORAL | Status: AC | PRN

## 2019-03-28 NOTE — Progress Notes (Signed)
   03/27/19 2253  MEWS Score  Resp 14  ECG Heart Rate (!) 37  Pulse Rate (!) 48  SpO2 93 %  O2 Device Room Air  MEWS Score  MEWS RR 0  MEWS Pulse 2  MEWS Systolic 2  MEWS LOC 0  MEWS Temp 0  MEWS Score 4  MEWS Score Color Red  MEWS Assessment  Is this an acute change? No  Provider Notification  Provider Name/Title Kennon Holter, NP  Date Provider Notified 03/27/19  Time Provider Notified 2345  Notification Type Page  Notification Reason Other (Comment) (3.71 sec pause per tele.)  Response No new orders  Date of Provider Response  (No response.)   Will continue to monitor.

## 2019-03-28 NOTE — Progress Notes (Addendum)
Patient is waiting PTAR

## 2019-03-28 NOTE — Progress Notes (Signed)
   03/28/19 0434  MEWS Score  Resp 15  ECG Heart Rate (!) 45  Pulse Rate (!) 52  BP (!) 65/37  SpO2 92 %  O2 Device Room Air  MEWS Score  MEWS RR 0  MEWS Pulse 1  MEWS Systolic 3  MEWS LOC 0  MEWS Temp 0  MEWS Score 4  MEWS Score Color Red  MEWS Assessment  Is this an acute change? No  Provider Notification  Provider Name/Title Kennon Holter, NP  Date Provider Notified 03/28/19  Time Provider Notified [phone]  Notification Type Page  Notification Reason Other (Comment) (BP 80/46, HR 39. )   Patient asleep in bed on right side. This RN woke patient, cleaned BM, turned patient to left side. When patient more alert, BP 76/46, HR 57. Recheck 65/37, HR 45. Awaiting response. Will continue to monitor.

## 2019-03-28 NOTE — Care Management Important Message (Signed)
Important Message  Patient Details  Name: [name] MRN: [id number] Date of Birth: 08/18/1950   Medicare Important Message Given:  Yes - Important Message mailed due to current National Emergency  Verbal consent obtained due to current National Emergency  Relationship to patient: Child Contact Name: Dresean Beckel Call Date: 03/28/19  Time: 1127 Phone: [phone] Outcome: Spoke with contact Important Message mailed to: Emergency contact on file    Delorse Lek 03/28/2019, 11:28 AM

## 2019-03-28 NOTE — Progress Notes (Signed)
   03/28/19 0021  MEWS Score  Resp 15  ECG Heart Rate (!) 47  Pulse Rate (!) 53  BP (!) 88/55  SpO2 93 %  O2 Device Room Air  MEWS Score  MEWS RR 0  MEWS Pulse 1  MEWS Systolic 1  MEWS LOC 0  MEWS Temp 0  MEWS Score 2  MEWS Score Color Yellow  MEWS Assessment  Is this an acute change? No  Provider Notification  Provider Name/Title Kennon Holter, NP  Date Provider Notified 03/28/19  Time Provider Notified 0028  Notification Type Page  Notification Reason Other (Comment) (BP post bolus, 2nd notification for 3.71 sec. pause.)  Response No new orders  Date of Provider Response 03/28/19  Time of Provider Response [phone]

## 2019-03-28 NOTE — Progress Notes (Signed)
ANTICOAGULATION CONSULT NOTE - Follow-Up  Pharmacy Consult for Warfarin Indication: atrial fibrillation   Assessment: 68 yo male presented on 03/13/2019 with weakness found to be positive for COVID-19. Patient has a history of slow Afib and was evaluated by cardiology on 02/15/2018 and recommended to begin anticoagulation but the patient did not follow up. Pharmacy has been consulted to dose warfarin for Afib. CHAD2-VASc score of 3.   INR today is still supra-therapeutic at 3.9 (slowly trending down) Only received 7.5 mg po x 2 doses 12/13 and 12/14 Hgb stable at 9, Plt low at 72 but trending up. No bleeding noted.   Goal of Therapy:  INR 2-3 Monitor platelets by anticoagulation protocol: Yes   Plan:  - Continue to hold Warfarin dose today - Daily PT/INR, CBC q72h - Monitor for s/sx of bleeding   Patient Measurements: Height: 6\' 4"  (193 cm) Weight: 244 lb (110.7 kg) IBW/kg (Calculated) : 86.8  Vital Signs: Temp: 98.1 F (36.7 C) (12/24 0526) Temp Source: Oral (12/24 0526) BP: 94/55 (12/24 0658) Pulse Rate: 48 (12/24 0659)  Labs: Recent Labs    03/26/19 0500 03/27/19 0500 03/28/19 0309  HGB 9.1* 9.2* 9.0*  HCT 26.2* 27.5* 27.1*  PLT 59* 60* 72*  LABPROT 42.2* 39.2* 38.4*  INR 4.4* 4.0* 3.9*  CREATININE 7.49* 9.02* 6.15*    Estimated Creatinine Clearance: 15.5 mL/min (A) (by C-G formula based on SCr of 6.15 mg/dL (H)).   Agnes Lawrence, PharmD PGY1 Pharmacy Resident

## 2019-03-28 NOTE — Discharge Summary (Signed)
Physician Discharge Summary  [name] KGY:[id number] DOB: October 21, 1950 DOA: 03/13/2019  PCP: [name], MD  Admit date: 03/13/2019 Discharge date: 03/28/2019  Admitted From:  ALF  Discharge disposition: SNF   Recommendations for Outpatient Follow-Up:   Follow up with your primary care provider at the skilled nursing facility in 3 to 5 days.  Patient has been decreased on the dose of midodrine due to bradycardia.  Will closely need to follow blood pressure.   Discharge Diagnosis:   Principal Problem:   COVID-19 virus infection Active Problems:   ESRD (end stage renal disease) on dialysis (HCC)   Hypotension of hemodialysis   Thrombocytopenia (HCC)   DM type 2 (diabetes mellitus, type 2) (HCC)   Bilateral blindness   Generalized weakness   Discharge Condition: Improved.  Diet recommendation: Low sodium, heart healthy.  Carbohydrate-modified.  Regular.  Wound care: None.  Code status: Full.   History of Present Illness:   [name] an 68 y.o.malemedical history significant for ESRD on hemodialysis, atrial fibrillation on warfarin, chronic hypotension on midodrine, legal blindness. He was brought to the hospital because of generalized weakness and tested positive for corona virus infection.  Hospital Course:  Following conditions were addressed during hospitalization.  Follow-up plan in italics [id number] pneumonia: Completed IV remdesivir on 03/18/2019. IV solumedrol  Completed on 03/26/2019.  Completed IV ceftriaxone and Rocephin.  On room air  Significant sinus bradycardia during hospitalization: Asymptomatic.  Seen by cardiology and patient is a poor candidate for pacemaker.  Heart rate has improved with decreasing dose of midodrine.    Avoid nodal blockers.  Chronic hypotension: He is on midodrine.  Dose has been decreased due to bradycardia.  Completed Solu-Medrol.  Blood pressure low but patient is asymptomatic.  ESRD: Nephrology on board  for hemodialysis/ultrafiltration.  Supratherapeutic INR/paroxysmal atrial fibrillation: He is on warfarin for long-term anticoagulation. INRis stillsupratherapeutic.      INR of 3.9 today.  On discharge, patient will be given instructions for 1 mg of Coumadin daily. Please hold Coumadin for next 2 days and restart at a very low-dose  aim of INR of 2-3  Thrombocytopenia: Platelet count of 72,000.     Need to be monitored as outpatient. No evidence of of bleeding at this time.  Debility/deconditioning: PT and OT recommend SNF. Patient used to be at assisted living facility.  Disposition.  At this time patient is considered stable for disposition to skilled nursing facility.  Patient strongly wishes to be discharged today.   Medical Consultants:    Nephrology  Cardiology   Subjective:   Today, patient feels okay.  Wishes to be discharged.  Denies cough, shortness of breath no fever or chills.  Has chronic pain.  Discharge Exam:   Vitals:   03/28/19 0953 03/28/19 1000  BP: (!) 78/41 (!) 74/42  Pulse: (!) 55 (!) 56  Resp:  12  Temp:    SpO2:     Vitals:   03/28/19 0810 03/28/19 0945 03/28/19 0953 03/28/19 1000  BP:  (!) 72/40 (!) 78/41 (!) 74/42  Pulse:  (!) 56 (!) 55 (!) 56  Resp:    12  Temp: 98.3 F (36.8 C) (!) 97.5 F (36.4 C)    TempSrc: Oral Oral    SpO2:  (!) 88%    Weight:      Height:        General exam: Thinly built, not in obvious distress HEENT: Legally blind Respiratory system: Bilateral equal air entry, diminished breath  sounds bilaterally.   Cardiovascular system: S1 & S2 heard, RRR.  Gastrointestinal system: Abdomen is nondistended, soft and nontender. No organomegaly or masses felt. Normal bowel sounds heard. Central nervous system: Alert awake and communicative.. No focal neurological deficits. Extremities: No edema, no clubbing ,no cyanosis, distal peripheral pulses palpable.  Transmetatarsal amputation on the right foot.  Left upper arm  fistula with Skin: Sacral decubitus ulceration stage II. MSK: Normal muscle bulk,tone ,power    Procedures:    Hemodialysis  The results of significant diagnostics from this hospitalization (including imaging, microbiology, ancillary and laboratory) are listed below for reference.     Diagnostic Studies:   DG Chest Port 1 View  Result Date: 03/14/2019 CLINICAL DATA:  Covid positive EXAM: PORTABLE CHEST 1 VIEW COMPARISON:  March 20, 2018 FINDINGS: There is mild cardiomegaly. Overlying median sternotomy wires. Aortic knob calcifications. Mildly increased hazy airspace opacity seen at the right lung base. The left lung is clear. No acute osseous abnormality. IMPRESSION: Mildly increased hazy airspace opacity at the right lung base which is non-specific could be due to atelectasis and/or early infectious etiology. Electronically Signed   By: Prudencio Pair M.D.   On: 03/14/2019 08:58     Labs:   Basic Metabolic Panel: Recent Labs  Lab 03/24/19 0357 03/25/19 0257 03/26/19 0500 03/27/19 0500 03/28/19 0309  NA 138 140 138 140 141  K 4.3 4.4 4.0 4.6 3.2*  CL 99 99 96* 98 99  CO2 22 21* 24 24 26   GLUCOSE 282* 361* 240* 134* 80  BUN 93* 118* 95* 122* 70*  CREATININE 7.60* 9.04* 7.49* 9.02* 6.15*  CALCIUM 8.3* 8.3* 8.4* 8.5* 8.3*  PHOS 6.8* 7.3* 6.1* 7.6* 6.4*   GFR Estimated Creatinine Clearance: 15.5 mL/min (A) (by C-G formula based on SCr of 6.15 mg/dL (H)). Liver Function Tests: Recent Labs  Lab 03/24/19 0357 03/25/19 0257 03/26/19 0500 03/27/19 0500 03/28/19 0309  ALBUMIN 2.6* 2.6* 2.6* 2.7* 2.4*   No results for input(s): LIPASE, AMYLASE in the last 168 hours. No results for input(s): AMMONIA in the last 168 hours. Coagulation profile Recent Labs  Lab 03/24/19 0358 03/25/19 0257 03/26/19 0500 03/27/19 0500 03/28/19 0309  INR 4.4* 4.2* 4.4* 4.0* 3.9*    CBC: Recent Labs  Lab 03/24/19 0358 03/25/19 0257 03/26/19 0500 03/27/19 0500 03/28/19 0309    WBC 5.9 4.9 5.7 5.6 6.0  NEUTROABS 5.3 4.5 5.2 5.1 5.0  HGB 9.4* 9.0* 9.1* 9.2* 9.0*  HCT 27.6* 26.7* 26.2* 27.5* 27.1*  MCV 104.5* 105.5* 104.8* 105.8* 105.4*  PLT 72* 53* 59* 60* 72*   Cardiac Enzymes: No results for input(s): CKTOTAL, CKMB, CKMBINDEX, TROPONINI in the last 168 hours. BNP: Invalid input(s): POCBNP CBG: Recent Labs  Lab 03/26/19 2117 03/27/19 1355 03/27/19 1710 03/27/19 2036 03/28/19 0752  GLUCAP 129* 131* 155* 152* 73   D-Dimer No results for input(s): DDIMER in the last 72 hours. Hgb A1c No results for input(s): HGBA1C in the last 72 hours. Lipid Profile No results for input(s): CHOL, HDL, LDLCALC, TRIG, CHOLHDL, LDLDIRECT in the last 72 hours. Thyroid function studies No results for input(s): TSH, T4TOTAL, T3FREE, THYROIDAB in the last 72 hours.  Invalid input(s): FREET3 Anemia work up Recent Labs    03/26/19 0500  FERRITIN 2,133*   Microbiology No results found for this or any previous visit (from the past 240 hour(s)).   Discharge Instructions:   Discharge Instructions    Diet - low sodium heart healthy   Complete by: As  directed    Diet Carb Modified   Complete by: As directed    Discharge instructions   Complete by: As directed    Follow-up with your primary care provider at the skilled nursing facility in 3 to 5 days.  Continue hemodialysis as scheduled.  Continue warfarin to keep INR between 2-3.   Increase activity slowly   Complete by: As directed      Allergies as of 03/28/2019      Reactions   Codeine Other (See Comments)    STATES MAKES HIM COMATOSE   Tape Other (See Comments)   Plastic tape tears skin off, please use paper tape instead.      Medication List    STOP taking these medications   aspirin EC 81 MG tablet   naproxen sodium 220 MG tablet Commonly known as: ALEVE     TAKE these medications   acetaminophen 325 MG tablet Commonly known as: TYLENOL Take 2 tablets (650 mg total) by mouth every 6 (six)  hours as needed for mild pain or headache (fever >/= 101).   albuterol 108 (90 Base) MCG/ACT inhaler Commonly known as: VENTOLIN HFA Inhale 2 puffs into the lungs every 6 (six) hours as needed for wheezing or shortness of breath.   Align 4 MG Caps Take 1 capsule by mouth See admin instructions. Take at 8am on Sunday, Tuesday, Thursday, Saturday Take at 5am on Monday, Wednesday, Friday   atorvastatin 10 MG tablet Commonly known as: LIPITOR Take 10 mg by mouth daily.   benzonatate 100 MG capsule Commonly known as: TESSALON Take 100 mg by mouth every 8 (eight) hours as needed for cough.   cyclobenzaprine 10 MG tablet Commonly known as: FLEXERIL Take 10 mg by mouth every 12 (twelve) hours as needed. Neck pain   feeding supplement (NEPRO CARB STEADY) Liqd Take 237 mLs by mouth 2 (two) times daily between meals.   feeding supplement (PRO-STAT SUGAR FREE 64) Liqd Take 30 mLs by mouth 2 (two) times daily.   ferrous sulfate 325 (65 FE) MG tablet Take 325 mg by mouth See admin instructions. 5am on Monday, Wednesday, Friday 8am on Sunday, Tuesday, Thursday, Saturday   Januvia 25 MG tablet Generic drug: sitaGLIPtin Take 25 mg by mouth See admin instructions. 8am on Sunday, Tuesday, Thursday, Saturday 5am on Mondau, Wednesday, Friday   midodrine 5 MG tablet Commonly known as: PROAMATINE Take 1 tablet (5 mg total) by mouth 3 (three) times daily with meals. What changed:   medication strength  how much to take   multivitamin Tabs tablet Take 1 tablet by mouth daily.   ondansetron 4 MG tablet Commonly known as: ZOFRAN Take 1 tablet (4 mg total) by mouth every 6 (six) hours as needed for nausea.   sevelamer carbonate 800 MG tablet Commonly known as: RENVELA Take 2,400-4,800 mg by mouth See admin instructions. Take 6 tablets at 8am (4800 mg) by mouth 3 times daily with meals on Tuesday, Thursday, Saturday, Sunday Take 6 tablets at 5am (4800 mg) by mouth 3 times daily with meals  on Monday, Wednesday, Friday  Take 3 tablets (2400 mg) 2 times daily with snacks   warfarin 1 MG tablet Commonly known as: Coumadin Take 1 tablet (1 mg total) by mouth daily. Adjust for INR 2-3, hold for 12/24 and 12/25 and start on 03/30/19 if INR is between 2-3 Start taking on: March 30, 2019        Time coordinating discharge: 39 minutes  Signed:  Vinessa Macconnell  [facility] 03/28/2019, 10:08 AM

## 2019-03-28 NOTE — Progress Notes (Signed)
Pt discharged to SNF via transport. Iv removed, all pts belongings taken with pt.

## 2019-03-28 NOTE — TOC Transition Note (Addendum)
Transition of Care [facility]) - CM/SW Discharge Note   Patient Details  Name: [name] MRN: [id number] Date of Birth: 1950-04-12  Transition of Care [facility]) CM/SW Contact:  Maryclare Labrador, RN Phone Number: 03/28/2019, 8:30 AM   Clinical Narrative:   Pt to discharge to [facility] today post HD session at [facility].  CM discussed bed offer from [facility] - pt is in agreement to discharge to facility today.  CM requested room number and report number from [name] with [facility].  [facility] forms printed to nurse station - CM requested completed DNR from attending.  CM followed up with pts son to request that he complete and return required paper work.    CM requested pt have a palliative follow up arranged by facility - facility in agreement and no order required  Pt will go to room 110 Report number:  [phone] pt will go to the special care unit.  Facility confirms that pt will transport to HD on Sunday - transportation will be arranged by facility.     CM set up transport with [facility] - requested pick up at 4pm      Barriers to Discharge: Continued Medical Work up   Patient Goals and CMS Choice Patient states their goals for this hospitalization and ongoing recovery are:: Pt states he is just ready to return home, pt informed CM that he has been blind and unsteady balance for 20 years.  "I just want to get back home"      Discharge Placement                       Discharge Plan and Services                                     Social Determinants of Health (SDOH) Interventions     Readmission Risk Interventions No flowsheet data found.

## 2019-03-28 NOTE — Progress Notes (Signed)
[facility] Progress Note   Dialysis Orders: MWF [facility] 4h 450/800 98kg 2/2 bath AVF Hep none - parsabiv 7.5mg  tiw  - low UF 0.2, 0.8 recently  CXR 12/10 - no edema, RLL infiltrate  Assessment/Plan:  1. COVID + PNA- admitted 03/14/19. S/p remdesivir 12/14 , abx, decadron  12/18 s/o rocephin - on room air 2. ESRD- continue with HD qMWF as tolerated K 3.2  BUN creeping up - in part due to weekend but also due to chronic shortened treatments due to high inpatient  Volume net UF 800 Wed weights here all above prior EDW and none done sine 12/20 due to broken scale- need to reassess at dialysis unit pre HD Saturday  3. Chronic hypotension- cont with midodrine/ on solumedrol 4. Bradycardia- poor candidate for pacemaker per Cardiology consult. Midodrine was held to see if this would improve HR however he became hypotensive with HD and ended early.  Have resumed midodrine and see how he does. Watch K levels  5. DM type 2- per primary- hyperglycemia 6. Anemia of CKD stage 5- started on aranesp 40 mcg on 03/19/19 Hgb 9 continue at d/c - stop oral Fe and give IV fe as needed 7. MBD- renal diet and binders 8. EOL-/deconditioning/  currently DNR, poor overall prognosis given multiple comorbidities.[facility] has offered bed but transportation issues per SW note therefore offer will likely be recinded. 9. Nutrition alb 2.6- has prostat - add nepro 10. Afib  With ^^ INR  - 3.9 at d/c - resume warfarin - 12/26 - per SNF 11. Thrombocytopenia - no heparin HD plts 72 K 12. D/c today to [facility] and he will need to go to dialysis Sunday at [facility] - due to holiday schedule at [place] unit.   Myriam Jacobson, PA-C [facility] Beeper [phone] 03/28/2019,11:48 AM  LOS: 14 days   Subjective:   For d/c today - outpt HD arranged at [facility]  Objective Vitals:   03/28/19 1100 03/28/19 1115 03/28/19 1130 03/28/19 1145  BP: (!) 62/39 (!) 69/42 (!) 72/45  (!) 77/41  Pulse: (!) 57 (!) 57 (!) 54 (!) 58  Resp:    13  Temp:      TempSrc:      SpO2:      Weight:      Height:       Physical Exam Physical exam: unable to complete due to COVID + status.In order to preserve PPE equipment and to minimize exposure to providers.Notes from other caregivers reviewed   Additional Objective  Labs: Basic Metabolic Panel: Recent Labs  Lab 03/26/19 0500 03/27/19 0500 03/28/19 0309  NA 138 140 141  K 4.0 4.6 3.2*  CL 96* 98 99  CO2 24 24 26   GLUCOSE 240* 134* 80  BUN 95* 122* 70*  CREATININE 7.49* 9.02* 6.15*  CALCIUM 8.4* 8.5* 8.3*  PHOS 6.1* 7.6* 6.4*   Liver Function Tests: Recent Labs  Lab 03/26/19 0500 03/27/19 0500 03/28/19 0309  ALBUMIN 2.6* 2.7* 2.4*   No results for input(s): LIPASE, AMYLASE in the last 168 hours. CBC: Recent Labs  Lab 03/24/19 0358 03/25/19 0257 03/26/19 0500 03/27/19 0500 03/28/19 0309  WBC 5.9 4.9 5.7 5.6 6.0  NEUTROABS 5.3 4.5 5.2 5.1 5.0  HGB 9.4* 9.0* 9.1* 9.2* 9.0*  HCT 27.6* 26.7* 26.2* 27.5* 27.1*  MCV 104.5* 105.5* 104.8* 105.8* 105.4*  PLT 72* 53* 59* 60* 72*   Blood Culture    Component Value Date/Time  SDES  03/14/2019 0858    BLOOD BLOOD RIGHT FOREARM Performed at [facility] [place] [address], [place], [place] [address]    SPECREQUEST  03/14/2019 [phone]    BOTTLES DRAWN AEROBIC AND ANAEROBIC Blood Culture adequate volume Performed at [facility] [address], [place], [place] [address]    CULT  03/14/2019 2130    NO GROWTH 5 DAYS Performed at [facility] [address] [place], [place] [address]    REPTSTATUS 03/19/2019 FINAL 03/14/2019 0858    Cardiac Enzymes: No results for input(s): CKTOTAL, CKMB, CKMBINDEX, TROPONINI in the last 168 hours. CBG: Recent Labs  Lab 03/26/19 2117 03/27/19 1355 03/27/19 1710 03/27/19 2036 03/28/19 0752  GLUCAP 129* 131* 155* 152* 73   Iron Studies:  Recent Labs     03/26/19 0500  FERRITIN 2,133*   Lab Results  Component Value Date   INR 3.9 (H) 03/28/2019   INR 4.0 (H) 03/27/2019   INR 4.4 (HH) 03/26/2019   Studies/Results: No results found. Medications: . sodium chloride 1,000 mL (03/14/19 0851)  . sodium chloride    . sodium chloride     . albuterol  2 puff Inhalation BID  . atorvastatin  10 mg Oral Daily  . Chlorhexidine Gluconate Cloth  6 each Topical Q0600  . Chlorhexidine Gluconate Cloth  6 each Topical Q0600  . Darbepoetin Alfa      . darbepoetin (ARANESP) injection - DIALYSIS  40 mcg Intravenous Q Wed-HD  . feeding supplement (NEPRO CARB STEADY)  237 mL Oral BID BM  . feeding supplement (PRO-STAT SUGAR FREE 64)  30 mL Oral BID  . insulin aspart  0-9 Units Subcutaneous TID WC  . insulin glargine  10 Units Subcutaneous Daily  . mouth rinse  15 mL Mouth Rinse BID  . midodrine  5 mg Oral Q M,W,F-HD  . multivitamin  1 tablet Oral QHS  . sevelamer carbonate  4,800 mg Oral TID WC  . Warfarin - Pharmacist Dosing Inpatient   Does not apply [phone]

## 2019-03-28 NOTE — Progress Notes (Signed)
Physical Therapy Treatment Patient Details Name: [name] MRN: [id number] DOB: Jul 09, 1950 Today's Date: 03/28/2019    History of Present Illness 68 yo admitted from [facility] with weakness and COVID +. Pt with sacral wound. PMhx: ESRD, bil blindness, hypotension on midodrine, DM, HLD, prostate CA, falls    PT Comments    On entry RN staff attempting to assist pt into HD chair for transport. Pt requires maxAx3 for coming into longsitting for AP transfer to chair. Pt with decreased ability to offload hips requiring maxAx3 for use of pad to scoot pt into chair. D/c plans remain appropriate.    Follow Up Recommendations  Supervision for mobility/OOB;SNF     Equipment Recommendations  None recommended by PT    Recommendations for Other Services       Precautions / Restrictions Precautions Precautions: Fall Precaution Comments: blind Restrictions Weight Bearing Restrictions: No    Mobility  Bed Mobility Overal bed mobility: Needs Assistance Bed Mobility: Rolling;Sidelying to Sit;Sit to Sidelying Rolling: Mod assist Sidelying to sit: +2 for physical assistance;+2 for safety/equipment;HOB elevated;Mod assist Supine to sit: Max assist;+2 for physical assistance     General bed mobility comments: pt assisted into longsitting for AP transfer to HD chair  Transfers Overall transfer level: Needs assistance   Transfers: Comptroller transfers: Max assist;+2 physical assistance(+3 for moving into chair)   General transfer comment: pt attempted assist to scoot back into chair however with decreased ability to elevate hips enough to slide. therapy utilized pad to scoot       Balance Overall balance assessment: Needs assistance Sitting-balance support: Feet supported;Bilateral upper extremity supported;Single extremity supported Sitting balance-Leahy Scale: Fair                                       Cognition Arousal/Alertness: Awake/alert Behavior During Therapy: WFL for tasks assessed/performed;Agitated Overall Cognitive Status: Impaired/Different from baseline Area of Impairment: Safety/judgement                         Safety/Judgement: Decreased awareness of deficits     General Comments: easily agitated even with words of encouragement/direction, but was agreeable to working with therapies         General Comments General comments (skin integrity, edema, etc.): VSS on RA       Pertinent Vitals/Pain Pain Assessment: 0-10 Faces Pain Scale: Hurts even more Pain Location: neck and generalized Pain Descriptors / Indicators: Aching;Discomfort;Sore Pain Intervention(s): Limited activity within patient's tolerance;Monitored during session;Repositioned     PT Goals (current goals can now be found in the care plan section) Acute Rehab PT Goals Patient Stated Goal: return to ALF PT Goal Formulation: With patient Time For Goal Achievement: 04/02/19 Potential to Achieve Goals: Fair    Frequency    Min 2X/week      PT Plan Current plan remains appropriate       AM-PAC PT "6 Clicks" Mobility   Outcome Measure  Help needed turning from your back to your side while in a flat bed without using bedrails?: A Lot Help needed moving from lying on your back to sitting on the side of a flat bed without using bedrails?: Total Help needed moving to and from a bed to a chair (including a wheelchair)?: Total Help needed standing up from a chair using your arms (e.g.,  wheelchair or bedside chair)?: Total Help needed to walk in hospital room?: Total Help needed climbing 3-5 steps with a railing? : Total 6 Click Score: 7    End of Session Equipment Utilized During Treatment: Gait belt Activity Tolerance: Patient limited by pain Patient left: in bed;with call bell/phone within reach;with bed alarm set;Other (comment) Nurse Communication: Mobility status PT Visit  Diagnosis: Other abnormalities of gait and mobility (R26.89)     Time: 7530-1040 PT Time Calculation (min) (ACUTE ONLY): 9 min  Charges:  $Therapeutic Activity: 8-22 mins                     Tiyana Galla B. Migdalia Dk PT, DPT Acute Rehabilitation Services Pager [phone] Office [phone]    [facility] 03/28/2019, 2:54 PM

## 2019-03-28 NOTE — Progress Notes (Addendum)
Patient back from dialysis. Alert and oriented, no acute distress noted, no complaints. VS stable. Will continue to monitor.

## 2019-03-28 NOTE — Progress Notes (Addendum)
Patient was discharged to nursing home [facility]) by MD order; discharged instructions review and will be send to facility via [place], with care notes; IV DIC; facility was called multiple times for report but nobody was able to take report;  patient will be transported to facility via [place].

## 2019-03-28 NOTE — Progress Notes (Signed)
Patient went to dialysis. Alert and oriented; no complaints or distress noticed.

## 2019-03-30 ENCOUNTER — Emergency Department (HOSPITAL_COMMUNITY): Payer: Medicare Other

## 2019-03-30 ENCOUNTER — Emergency Department (HOSPITAL_COMMUNITY)
Admission: EM | Admit: 2019-03-30 | Discharge: 2019-03-31 | Disposition: A | Discharge status: Home / Self Care | Payer: Medicare Other | Attending: Emergency Medicine | Admitting: Emergency Medicine

## 2019-03-30 DIAGNOSIS — Z79899 Other long term (current) drug therapy: Secondary | ICD-10-CM | POA: Insufficient documentation

## 2019-03-30 DIAGNOSIS — I120 Hypertensive chronic kidney disease with stage 5 chronic kidney disease or end stage renal disease: Secondary | ICD-10-CM | POA: Insufficient documentation

## 2019-03-30 DIAGNOSIS — U071 COVID-19: Secondary | ICD-10-CM | POA: Diagnosis not present

## 2019-03-30 DIAGNOSIS — Z8701 Personal history of pneumonia (recurrent): Secondary | ICD-10-CM | POA: Diagnosis not present

## 2019-03-30 DIAGNOSIS — Z20828 Contact with and (suspected) exposure to other viral communicable diseases: Secondary | ICD-10-CM | POA: Insufficient documentation

## 2019-03-30 DIAGNOSIS — N186 End stage renal disease: Secondary | ICD-10-CM | POA: Insufficient documentation

## 2019-03-30 DIAGNOSIS — I2510 Atherosclerotic heart disease of native coronary artery without angina pectoris: Secondary | ICD-10-CM | POA: Insufficient documentation

## 2019-03-30 DIAGNOSIS — R0902 Hypoxemia: Secondary | ICD-10-CM | POA: Diagnosis not present

## 2019-03-30 DIAGNOSIS — Z743 Need for continuous supervision: Secondary | ICD-10-CM | POA: Diagnosis not present

## 2019-03-30 DIAGNOSIS — Z992 Dependence on renal dialysis: Secondary | ICD-10-CM | POA: Diagnosis not present

## 2019-03-30 DIAGNOSIS — E1122 Type 2 diabetes mellitus with diabetic chronic kidney disease: Secondary | ICD-10-CM | POA: Diagnosis not present

## 2019-03-30 DIAGNOSIS — I959 Hypotension, unspecified: Secondary | ICD-10-CM | POA: Diagnosis not present

## 2019-03-30 DIAGNOSIS — Z7901 Long term (current) use of anticoagulants: Secondary | ICD-10-CM | POA: Insufficient documentation

## 2019-03-30 DIAGNOSIS — E1165 Type 2 diabetes mellitus with hyperglycemia: Secondary | ICD-10-CM | POA: Diagnosis not present

## 2019-03-30 DIAGNOSIS — J189 Pneumonia, unspecified organism: Secondary | ICD-10-CM

## 2019-03-30 LAB — I-STAT CHEM 8, ED
BUN: 72 mg/dL — ABNORMAL HIGH (ref 8–23)
Calcium, Ion: 1.12 mmol/L — ABNORMAL LOW (ref 1.15–1.40)
Chloride: 103 mmol/L (ref 98–111)
Creatinine, Ser: 7.6 mg/dL — ABNORMAL HIGH (ref 0.61–1.24)
Glucose, Bld: 187 mg/dL — ABNORMAL HIGH (ref 70–99)
HCT: 23 % — ABNORMAL LOW (ref 39.0–52.0)
Hemoglobin: 7.8 g/dL — ABNORMAL LOW (ref 13.0–17.0)
Potassium: 3.6 mmol/L (ref 3.5–5.1)
Sodium: 143 mmol/L (ref 135–145)
TCO2: 29 mmol/L (ref 22–32)

## 2019-03-30 NOTE — ED Provider Notes (Signed)
[facility] EMERGENCY DEPARTMENT Provider Note   CSN: [id number] Arrival date & time: 03/30/19  1942     History Chief Complaint  Patient presents with   Hypotension    [name] is a 68 y.o. male.  Patient recently diagnosed with Covid and discharged from the hospital after antibiotics and steroids.  Sent from nursing facility for hypotension which may be chronic.  History of renal disease and on dialysis.  Had dialysis yesterday.  Patient continues to have cough.  He is blind.  He denies any fever, pain.  He continues to have a dry cough but no sputum production.  The history is provided by the patient.  Illness Location:  General Severity:  Mild Timing:  Intermittent Progression:  Waxing and waning Chronicity:  Recurrent Associated symptoms: cough   Associated symptoms: no abdominal pain, no chest pain, no ear pain, no fever, no rash, no shortness of breath, no sore throat and no vomiting        Past Medical History:  Diagnosis Date   Anemia    Arthritis    "back, neck" (07/28/2014)   Blind    both eyes removed    Bruises easily    CAD, NATIVE VESSEL 01/08/2008       Cellulitis late 1980's   "hospitalized; wrapped both legs; several times; no OR for this"   Colon polyps    Diabetic neuropathy ([place])    Dialysis patient ([place])    DM type 2 (diabetes mellitus, type 2) ([place])    no medications (07/28/2014) diet and excersie controlled not onmeds for 14-15 years    Dysrhythmia    afib    ESRD (end stage renal disease) on dialysis [facility])    [employer]; Mon, Wed, Fri (07/28/2014)   Fall    hx of fall and required left hip surgery    Family history of breast cancer    mother   Heart murmur    hx of   History of blood product transfusion    HYPERLIPIDEMIA-MIXED 01/08/2008   HYPERTENSION 01/27/2009   BP low , hx of HTN, Currently on meds to raise BP   Hypotension    Kidney stones    Low blood pressure     OVERWEIGHT/OBESITY 01/08/2008   Lost 205 lbs through diet and exercise.     Peripheral vascular disease ([place])    vein stripping   Pneumonia 2000;s X 1   Prostate cancer ([place])    Prosthetic eye globe    both eyes   Renal cell carcinoma 2001 and 2003   "both kidneys"   Renal insufficiency     Patient Active Problem List   Diagnosis Date Noted   COVID-19 virus infection 03/14/2019   Generalized weakness 03/14/2019   Secondary and unspecified malignant neoplasm of intrathoracic lymph nodes ([place]) 11/15/2018   Pressure injury of skin 03/21/2018   Other pancytopenia ([place]) 03/17/2018   CAP (community acquired pneumonia) 03/16/2018   HCAP (healthcare-associated pneumonia) 03/16/2018   Other persistent atrial fibrillation ([place]) 02/15/2018   Renal cell cancer, right ([place]) 04/19/2017   Macrocytic anemia 04/11/2016   Diarrhea 04/11/2016   Metastatic renal cell carcinoma ([place]) 04/07/2016   Hx of exploratory laparotomy 04/07/2016   Shock circulatory ([place]) 04/07/2016   Pre-operative cardiovascular examination 02/19/2016   Bacteremia    Abnormal CT of the abdomen    Abdominal pain 11/01/2015   UTI (lower urinary tract infection) 11/01/2015   Sepsis ([place]) 11/01/2015   Bilateral blindness 06/16/2015  Carcinoma of kidney ([place]) 06/16/2015   Type 2 diabetes mellitus ([employer]) 06/16/2015   Intra-abdominal infection 05/11/2015   Chronic anemia 05/11/2015   Infection in abdomen ([place]) 05/11/2015   Second degree AV block, Mobitz type I 02/15/2015   Atrioventricular block, Mobitz type 1, Wenckebach 02/15/2015   Hypotension 02/14/2015   Hypoglycemia 02/13/2015   DM type 2, goal A1c below 7 02/13/2015   Blind    DM type 2 (diabetes mellitus, type 2) ([place])    Clear cell renal cell carcinoma ([place])    CAD in native artery 09/10/2014   Renal carcinoma (HCC)    Non-intractable cyclical vomiting with nausea    Hydronephrosis 07/28/2014   Coccyx pain  01/03/2014   Thrombocytopenia ([place]) 01/03/2014   Intertrochanteric fracture of left hip ([place]) 12/30/2013   Dialysis patient ([place]) 12/30/2013   Hip fracture ([place]) 12/30/2013   Dependence on renal dialysis ([place]) 12/30/2013   Hypotension of hemodialysis 06/19/2013   Malignant neoplasm of prostate ([place]) 01/30/2012   Diabetes mellitus ([place]) 08/30/2011   Peripheral vascular disease ([place]) 05/27/2009   COLONIC POLYPS 02/12/2009   ANEMIA 01/27/2009   GLAUCOMA 01/27/2009   Essential hypertension 01/27/2009   ESRD (end stage renal disease) on dialysis ([place]) 01/27/2009   End stage renal failure on dialysis ([place]) 01/27/2009   DIAB W/UNS COMP TYPE II/UNS NOT STATED UNCNTRL 01/08/2008   HYPERLIPIDEMIA-MIXED 01/08/2008   OVERWEIGHT/OBESITY 01/08/2008   DEPRESSIVE DISORDER NOT ELSEWHERE CLASSIFIED 01/08/2008   Hx of CABG-2013 01/08/2008   EDEMA 01/08/2008   Clinical depression 01/08/2008   HLD (hyperlipidemia) 01/08/2008    Past Surgical History:  Procedure Laterality Date   AV FISTULA PLACEMENT Left 02/2010   LFA   AV FISTULA PLACEMENT Left 01/12/2016   Procedure: LEFT BRACHIOCEPHALIC ARTERIOVENOUS (AV) FISTULA CREATION;  Surgeon: Angelia Mould, MD;  Location: [place];  Service: Vascular;  Laterality: Left;   CHOLECYSTECTOMY OPEN  1992   COLONOSCOPY  06/14/2011   Procedure: COLONOSCOPY;  Surgeon: [name], MD;  Location: [facility];  Service: Endoscopy;  Laterality: N/A;   COLONOSCOPY N/A 07/24/2012   Procedure: COLONOSCOPY;  Surgeon: [name], MD;  [facility];  Service: Endoscopy;  Laterality: N/A;   COLONOSCOPY     COLONOSCOPY N/A 11/04/2015   Procedure: COLONOSCOPY;  Surgeon: Manus Gunning, MD;  Location[facility];  Service: Gastroenterology;  Laterality: N/A;   COLONOSCOPY WITH PROPOFOL N/A 05/13/2014   Procedure: COLONOSCOPY WITH PROPOFOL;  Surgeon: [name], MD;  [facility];  Service: Endoscopy;   Laterality: N/A;   CORONARY ARTERY BYPASS GRAFT  09/29/2011   Procedure: CORONARY ARTERY BYPASS GRAFTING (CABG);  Surgeon: [name], MD;  Location: [place];  Service: Open Heart Surgery;  Laterality: N/A;  Coronary Artery Bypass Graft times two utilizing the left internal mammary artery and the right greater saphenous vein harvested endoscopically.   CYSTOSCOPY WITH RETROGRADE PYELOGRAM, URETEROSCOPY AND STENT PLACEMENT Left 07/28/2014   Procedure: CYSTOSCOPY WITH RETROGRADE PYELOGRAM, URETERAL BALLOON DILITATION, URETEROSCOPY AND LEFT STENT PLACEMENT;  Surgeon: [name], MD;  Location: [facility];  Service: Urology;  Laterality: Left;   ENUCLEATION  2003; 2006   bilateral; "diabetes; pain"   FOOT AMPUTATION Right ~ 2002   right;  partial; "infection"   FRACTURE SURGERY     hip fx   HIP PINNING,CANNULATED Left 12/31/2013   Procedure: CANNULATED HIP PINNING;  Surgeon: Renette Butters, MD;  Location: [place];  Service: Orthopedics;  Laterality: Left;  Carm, FX Table,  [employer]   HOT HEMOSTASIS  06/14/2011   Procedure: HOT HEMOSTASIS (ARGON PLASMA COAGULATION/BICAP);  Surgeon: [name], MD;  Location: [facility];  Service: Endoscopy;  Laterality: N/A;   INSERTION OF DIALYSIS CATHETER Right 01/12/2016   Procedure: INSERTION OF DIALYSIS CATHETER;  Surgeon: Angelia Mould, MD;  Location: [place];  Service: Vascular;  Laterality: Right;   INSERTION PROSTATE RADIATION SEED  03/2012   IR GENERIC HISTORICAL  01/13/2016   IR RADIOLOGIST EVAL & MGMT 01/13/2016 Aletta Edouard, MD [facility] INTERV RAD   IR RADIOLOGIST EVAL & MGMT  03/02/2017   IR RADIOLOGIST EVAL & MGMT  05/18/2017   IR RADIOLOGIST EVAL & MGMT  07/27/2017   LAPAROTOMY N/A 04/07/2016   Procedure: EXPLORATORY LAPAROTOMY AND RESECTION OF RETROPERITONEAL MASS;  Surgeon: Raynelle Bring, MD;  [facility];  Service: Urology;  Laterality: N/A;   LEFT HEART CATHETERIZATION WITH CORONARY ANGIOGRAM N/A 09/27/2011   Procedure:  LEFT HEART CATHETERIZATION WITH CORONARY ANGIOGRAM;  Surgeon: [name], MD;  Location: [facility] CATH LAB;  Service: Cardiovascular;  Laterality: N/A;   LIGATION OF ARTERIOVENOUS  FISTULA Left 01/12/2016   Procedure: LIGATION OF LEFT RADIOCEPHALIC ARTERIOVENOUS  FISTULA;  Surgeon: Angelia Mould, MD;  Location: [place];  Service: Vascular;  Laterality: Left;   LIGATION OF COMPETING BRANCHES OF ARTERIOVENOUS FISTULA Left 07/29/2014   Procedure: LIGATION OF COMPETING BRANCHES OF LEFT ARM ARTERIOVENOUS FISTULA;  Surgeon: [name], MD;  Location: [place];  Service: Vascular;  Laterality: Left;   NEPHRECTOMY Right 01/30/2015   done at [facility] Right 04/07/2016   Procedure: RIGHT COLECTOMY AND PARTIAL LIVER RESECTION;  Surgeon: Raynelle Bring, MD;  [facility];  Service: Urology;  Laterality: Right;   RADIOFREQUENCY ABLATION N/A 04/19/2017   Procedure: CT MICROWAVE THERMAL ABLATION;  Surgeon: Aletta Edouard, MD;  [facility];  Service: Anesthesiology;  Laterality: N/A;   [place] KIDNEY  2010-2012   "twice; one on each side; for cancer"   [place] Left 09/01/2015   Procedure: EXPLORATION LEFT LOWER ARM  RADIOCEPHALIC ARTERIOVENOUS FISTULA;  Surgeon: Conrad Quemado, MD;  Location: [place];  Service: Vascular;  Laterality: Left;   VARICOSE VEIN SURGERY  mid 1980's    BLE; "knees down; both legs; 2 separate times"       Family History  Problem Relation Age of Onset   Cancer Mother 5       breast, spine and ovarian   Heart disease Mother    Hyperlipidemia Mother    Hypertension Mother    Varicose Veins Mother    Emphysema Father    Cancer Father 60       kidney, prostate   Heart disease Father    Hyperlipidemia Father    Hypertension Father    Cancer Sister        ovarian   Cancer Brother        lung   Heart disease Brother    Hyperlipidemia Brother    Hypertension Brother    Heart  attack Brother    Peripheral vascular disease Brother    Cancer Other    Coronary artery disease Other    Kidney disease Other    Breast cancer Other    Ovarian cancer Other    Lung cancer Other    Diabetes Other    Malignant hyperthermia Neg Hx     Social History   Tobacco Use   Smoking status: Never Smoker  Smokeless tobacco: Never Used  Substance Use Topics   Alcohol use: No    Alcohol/week: 0.0 standard drinks   Drug use: No    Home Medications Prior to Admission medications   Medication Sig Start Date End Date Taking? Authorizing Provider  acetaminophen (TYLENOL) 325 MG tablet Take 2 tablets (650 mg total) by mouth every 6 (six) hours as needed for mild pain or headache (fever >/= 101). 03/28/19   Pokhrel, Corrie Mckusick, MD  albuterol (VENTOLIN HFA) 108 (90 Base) MCG/ACT inhaler Inhale 2 puffs into the lungs every 6 (six) hours as needed for wheezing or shortness of breath. 03/28/19   Pokhrel, Corrie Mckusick, MD  Amino Acids-Protein Hydrolys (FEEDING SUPPLEMENT, PRO-STAT SUGAR FREE 64,) LIQD Take 30 mLs by mouth 2 (two) times daily. 03/28/19   Pokhrel, Corrie Mckusick, MD  atorvastatin (LIPITOR) 10 MG tablet Take 10 mg by mouth daily.    [provider]  benzonatate (TESSALON) 100 MG capsule Take 100 mg by mouth every 8 (eight) hours as needed for cough.    [provider]  cyclobenzaprine (FLEXERIL) 10 MG tablet Take 10 mg by mouth every 12 (twelve) hours as needed. Neck pain 09/26/18   [provider]  ferrous sulfate 325 (65 FE) MG tablet Take 325 mg by mouth See admin instructions. 5am on Monday, Wednesday, Friday 8am on Sunday, Tuesday, Thursday, Saturday    [provider]  JANUVIA 25 MG tablet Take 25 mg by mouth See admin instructions. 8am on Sunday, Tuesday, Thursday, Saturday 5am on [place], Wednesday, Friday 03/04/19   [provider]  midodrine (PROAMATINE) 5 MG tablet Take 1 tablet (5 mg total) by mouth 3 (three) times daily with  meals. 03/28/19   Pokhrel, Corrie Mckusick, MD  multivitamin (RENA-VIT) TABS tablet Take 1 tablet by mouth daily.    [provider]  Nutritional Supplements (FEEDING SUPPLEMENT, NEPRO CARB STEADY,) LIQD Take 237 mLs by mouth 2 (two) times daily between meals. 03/28/19   Pokhrel, Corrie Mckusick, MD  ondansetron (ZOFRAN) 4 MG tablet Take 1 tablet (4 mg total) by mouth every 6 (six) hours as needed for nausea. 03/28/19   Pokhrel, Corrie Mckusick, MD  Probiotic Product (ALIGN) 4 MG CAPS Take 1 capsule by mouth See admin instructions. Take at 8am on Sunday, Tuesday, Thursday, Saturday Take at 5am on Monday, Wednesday, Friday    [provider]  sevelamer carbonate (RENVELA) 800 MG tablet Take 2,400-4,800 mg by mouth See admin instructions. Take 6 tablets at 8am (4800 mg) by mouth 3 times daily with meals on Tuesday, Thursday, Saturday, Sunday Take 6 tablets at 5am (4800 mg) by mouth 3 times daily with meals on Monday, Wednesday, Friday  Take 3 tablets (2400 mg) 2 times daily with snacks 06/26/14   [provider]  warfarin (COUMADIN) 1 MG tablet Take 1 tablet (1 mg total) by mouth daily. Adjust for INR 2-3, hold for 12/24 and 12/25 and start on 03/30/19 if INR is between 2-3 03/30/19 03/29/20  Flora Lipps, MD    Allergies    Codeine and Tape  Review of Systems   Review of Systems  Constitutional: Negative for chills and fever.  HENT: Negative for ear pain and sore throat.   Eyes: Negative for pain and visual disturbance.  Respiratory: Positive for cough. Negative for shortness of breath.   Cardiovascular: Negative for chest pain and palpitations.  Gastrointestinal: Negative for abdominal pain and vomiting.  Genitourinary: Negative for dysuria and hematuria.  Musculoskeletal: Negative for arthralgias and back pain.  Skin: Negative  for color change and rash.  Neurological: Negative for seizures and syncope.  All other systems reviewed and are negative.   Physical Exam Updated Vital  Signs  ED Triage Vitals  Enc Vitals Group     BP 03/30/19 2001 (!) 71/42     Pulse Rate 03/30/19 2001 (!) 59     Resp 03/30/19 2001 16     Temp 03/30/19 2001 98.1 F (36.7 C)     Temp Source 03/30/19 2001 Oral     SpO2 03/30/19 2001 94 %     Weight --      Height --      Head Circumference --      Peak Flow --      Pain Score 03/30/19 2000 0     Pain Loc --      Pain Edu? --      Excl. in [place]? --     Physical Exam Vitals and nursing note reviewed.  Constitutional:      Appearance: He is well-developed.  HENT:     Head: Normocephalic and atraumatic.     Nose: Nose normal.     Mouth/Throat:     Mouth: Mucous membranes are moist.     Pharynx: No oropharyngeal exudate or posterior oropharyngeal erythema.  Eyes:     Conjunctiva/sclera: Conjunctivae normal.  Cardiovascular:     Rate and Rhythm: Normal rate and regular rhythm.     Pulses: Normal pulses.     Heart sounds: Normal heart sounds. No murmur.  Pulmonary:     Effort: Pulmonary effort is normal. No respiratory distress.     Comments: Coarse breath sounds Abdominal:     Palpations: Abdomen is soft.     Tenderness: There is no abdominal tenderness.  Musculoskeletal:        General: Normal range of motion.     Cervical back: Normal range of motion and neck supple.  Skin:    General: Skin is warm and dry.  Neurological:     General: No focal deficit present.     Mental Status: He is alert.  Psychiatric:        Mood and Affect: Mood normal.     ED Results / Procedures / Treatments   Labs (all labs ordered are listed, but only abnormal results are displayed) Labs Reviewed  CBC WITH DIFFERENTIAL/PLATELET - Abnormal; Notable for the following components:      Result Value   WBC 3.9 (*)    RBC 2.46 (*)    Hemoglobin 8.7 (*)    HCT 28.1 (*)    MCV 114.2 (*)    MCH 35.4 (*)    All other components within normal limits  COMPREHENSIVE METABOLIC PANEL - Abnormal; Notable for the following components:   Glucose,  Bld 194 (*)    BUN 71 (*)    Creatinine, Ser 7.90 (*)    Calcium 8.4 (*)    Total Protein 4.4 (*)    Albumin 2.4 (*)    GFR calc non Af Amer 6 (*)    GFR calc Af Amer 7 (*)    All other components within normal limits  I-STAT CHEM 8, ED - Abnormal; Notable for the following components:   BUN 72 (*)    Creatinine, Ser 7.60 (*)    Glucose, Bld 187 (*)    Calcium, Ion 1.12 (*)    Hemoglobin 7.8 (*)    HCT 23.0 (*)    All other components within normal limits  CULTURE, BLOOD (ROUTINE X 2)  CULTURE, BLOOD (ROUTINE X 2)  LACTIC ACID, PLASMA  LACTIC ACID, PLASMA  PROTIME-INR  POC SARS CORONAVIRUS 2 AG -  ED    EKG None  Radiology DG Chest Portable 1 View  Result Date: 03/30/2019 CLINICAL DATA:  Hypotension. Dialysis. Patient tested positive for COVID-19 17 days ago. EXAM: PORTABLE CHEST 1 VIEW COMPARISON:  One-view chest x-ray 12/13/2018 FINDINGS: The heart is mildly enlarged. Atherosclerotic changes are present at the aortic arch. Progressive right middle lobe airspace disease is present. Left lung is clear. IMPRESSION: 1. Progressive right middle lobe airspace disease compatible with pneumonia. 2. Atherosclerosis of the thoracic aorta. 3. Mild cardiomegaly without failure. Electronically Signed   By: San Morelle M.D.   On: 03/30/2019 21:31    Procedures Procedures (including critical care time)  Medications Ordered in ED Medications - No data to display  ED Course  I have reviewed the triage vital signs and the nursing notes.  Pertinent labs & imaging results that were available during my care of the patient were reviewed by me and considered in my medical decision making (see chart for details).    MDM Rules/Calculators/A&P  DAKING WESTERVELT is a 68 year old male with history of end-stage renal disease on hemodialysis, blindness, peripheral vascular disease, recent Covid who presents to the ED with low blood pressure from nursing home.  Blood pressure in the 70s  upon arrival but did improve to the 66Y systolic.  Patient was given small fluid bolus with EMS.  It appears that he may have chronic hypotension.  He is on midodrine that was recently decreased due to bradycardia.  Heart rate is in the 50s.  He has cough but denies any fever or other infectious symptoms.  He did recently finish a course of IV antibiotics as well as appears to still be on Decadron for his Covid.  Blood pressure has improved now while he is awaiting lab work to 403 systolic.  He does not have a fever.  However, chest x-ray does show possible progression of pneumonia.  Will give further infectious labs to see if patient does have any signs to suggest sepsis.  However he clinically appears well.  Suspect hypotension is likely more chronic.  It appears that he was discharged from hospital with blood pressure in the 80s.  His rapid Covid test is negative.  His hemoglobin is 8.7 which appears to be around baseline.  White count is 3.9.  Creatinine overall slightly above baseline.  Otherwise electrolytes appear unremarkable.  Awaiting lactic acid, INR.  Less likely PE given that he is on anticoagulation.  Signed out to oncoming ED staff with patient pending remaining labs.  Blood cultures have been collected.  Anticipate if lab work is reassuring and vital signs stay stable he is likely okay for discharge to home on oral antibiotics.  This chart was dictated using voice recognition software.  Despite best efforts to proofread,  errors can occur which can change the documentation meaning.    Final Clinical Impression(s) / ED Diagnoses Final diagnoses:  Hypotension, unspecified hypotension type  Recurrent pneumonia    Rx / DC Orders ED Discharge Orders    None       Lennice Sites, DO 03/31/19 [id number]

## 2019-03-30 NOTE — ED Triage Notes (Signed)
Pt arrives via [facility] from [facility] where staff reported the pt blood pressure has be dropping all day since he arrived to the facility. EMS was called when the pt blood pressure was 60/30 at the facility.EMS administered 150 mL of fluid. Pt is a dialysis pt. Pt tested COVID + on 12/9. Pt stated he has a history of hypotension. Pt denies pain and SOB at this time.[name]

## 2019-03-30 NOTE — ED Notes (Signed)
Pt O2 saturation 93% on room air. Pt refuses oxygen and cardiac monitor at this pt. MD notified.

## 2019-03-31 DIAGNOSIS — Z7401 Bed confinement status: Secondary | ICD-10-CM | POA: Diagnosis not present

## 2019-03-31 DIAGNOSIS — I959 Hypotension, unspecified: Secondary | ICD-10-CM | POA: Diagnosis not present

## 2019-03-31 DIAGNOSIS — M2550 Pain in unspecified joint: Secondary | ICD-10-CM | POA: Diagnosis not present

## 2019-03-31 LAB — CBC WITH DIFFERENTIAL/PLATELET
Abs Immature Granulocytes: 0.07 10*3/uL (ref 0.00–0.07)
Basophils Absolute: 0 10*3/uL (ref 0.0–0.1)
Basophils Relative: 0 %
Eosinophils Absolute: 0.1 10*3/uL (ref 0.0–0.5)
Eosinophils Relative: 2 %
HCT: 28.1 % — ABNORMAL LOW (ref 39.0–52.0)
Hemoglobin: 8.7 g/dL — ABNORMAL LOW (ref 13.0–17.0)
Immature Granulocytes: 2 %
Lymphocytes Relative: 6 %
Lymphs Abs: 0.2 10*3/uL — ABNORMAL LOW (ref 0.7–4.0)
MCH: 35.4 pg — ABNORMAL HIGH (ref 26.0–34.0)
MCHC: 31 g/dL (ref 30.0–36.0)
MCV: 114.2 fL — ABNORMAL HIGH (ref 80.0–100.0)
Monocytes Absolute: 0.4 10*3/uL (ref 0.1–1.0)
Monocytes Relative: 9 %
Neutro Abs: 3.2 10*3/uL (ref 1.7–7.7)
Neutrophils Relative %: 81 %
Platelets: 48 10*3/uL — ABNORMAL LOW (ref 150–400)
RBC: 2.46 MIL/uL — ABNORMAL LOW (ref 4.22–5.81)
RDW: 14.5 % (ref 11.5–15.5)
WBC: 3.9 10*3/uL — ABNORMAL LOW (ref 4.0–10.5)
nRBC: 0 % (ref 0.0–0.2)

## 2019-03-31 LAB — COMPREHENSIVE METABOLIC PANEL
ALT: 32 U/L (ref 0–44)
AST: 24 U/L (ref 15–41)
Albumin: 2.4 g/dL — ABNORMAL LOW (ref 3.5–5.0)
Alkaline Phosphatase: 85 U/L (ref 38–126)
Anion gap: 13 (ref 5–15)
BUN: 71 mg/dL — ABNORMAL HIGH (ref 8–23)
CO2: 26 mmol/L (ref 22–32)
Calcium: 8.4 mg/dL — ABNORMAL LOW (ref 8.9–10.3)
Chloride: 105 mmol/L (ref 98–111)
Creatinine, Ser: 7.9 mg/dL — ABNORMAL HIGH (ref 0.61–1.24)
GFR calc Af Amer: 7 mL/min — ABNORMAL LOW (ref >60)
GFR calc non Af Amer: 6 mL/min — ABNORMAL LOW (ref >60)
Glucose, Bld: 194 mg/dL — ABNORMAL HIGH (ref 70–99)
Potassium: 4.1 mmol/L (ref 3.5–5.1)
Sodium: 144 mmol/L (ref 135–145)
Total Bilirubin: 0.6 mg/dL (ref 0.3–1.2)
Total Protein: 4.4 g/dL — ABNORMAL LOW (ref 6.5–8.1)

## 2019-03-31 LAB — PROTIME-INR
INR: 1.3 — ABNORMAL HIGH (ref 0.8–1.2)
Prothrombin Time: 15.9 seconds — ABNORMAL HIGH (ref 11.4–15.2)

## 2019-03-31 LAB — LACTIC ACID, PLASMA: Lactic Acid, Venous: 1.7 mmol/L (ref 0.5–1.9)

## 2019-03-31 LAB — POC SARS CORONAVIRUS 2 AG -  ED: SARS Coronavirus 2 Ag: NEGATIVE

## 2019-03-31 NOTE — ED Provider Notes (Signed)
Care assumed from Dr. Ronnald Nian at shift change.  Patient sent here for evaluation of low blood pressure.  Patient with history of end-stage renal disease on hemodialysis and recent diagnosis of COVID-19.  He was sent from his extended care facility due to his blood pressure being low.  Patient has a history of chronic hypotension and takes Midodrine.    When the patient was discharged from the hospital 2 days ago, his blood pressure was 70 systolic.  His blood pressures here have been an improvement over that.  Patient work-up essentially unremarkable otherwise.  His lactate is normal and I doubt a septic condition.  Patient to be discharged, to return as needed.   Veryl Speak, MD 03/31/19 [phone]

## 2019-03-31 NOTE — ED Notes (Signed)
Blood draws unsuccessful. RN x2 attempts and phlebotomist x2 attempts.

## 2019-03-31 NOTE — Discharge Instructions (Addendum)
Continue your hemodialysis schedule as before.  Continue other medications as previously prescribed.  Return to the emergency department if you develop any new and/or concerning symptoms.

## 2019-03-31 NOTE — ED Notes (Signed)
Called ptar 

## 2019-04-04 DIAGNOSIS — U071 COVID-19: Secondary | ICD-10-CM | POA: Diagnosis not present

## 2019-04-04 DIAGNOSIS — R0902 Hypoxemia: Secondary | ICD-10-CM | POA: Diagnosis not present

## 2019-04-04 DIAGNOSIS — Z7401 Bed confinement status: Secondary | ICD-10-CM | POA: Diagnosis not present

## 2019-04-04 DIAGNOSIS — I959 Hypotension, unspecified: Secondary | ICD-10-CM | POA: Diagnosis not present

## 2019-04-04 DIAGNOSIS — M2550 Pain in unspecified joint: Secondary | ICD-10-CM | POA: Diagnosis not present

## 2019-04-04 DIAGNOSIS — Z743 Need for continuous supervision: Secondary | ICD-10-CM | POA: Diagnosis not present

## 2019-04-05 DIAGNOSIS — I2510 Atherosclerotic heart disease of native coronary artery without angina pectoris: Secondary | ICD-10-CM | POA: Diagnosis not present

## 2019-04-05 DIAGNOSIS — R52 Pain, unspecified: Secondary | ICD-10-CM | POA: Diagnosis not present

## 2019-04-05 DIAGNOSIS — R0902 Hypoxemia: Secondary | ICD-10-CM | POA: Diagnosis not present

## 2019-04-05 DIAGNOSIS — Z8546 Personal history of malignant neoplasm of prostate: Secondary | ICD-10-CM | POA: Diagnosis not present

## 2019-04-05 DIAGNOSIS — I959 Hypotension, unspecified: Secondary | ICD-10-CM | POA: Diagnosis not present

## 2019-04-05 DIAGNOSIS — H547 Unspecified visual loss: Secondary | ICD-10-CM | POA: Diagnosis not present

## 2019-04-05 DIAGNOSIS — R5381 Other malaise: Secondary | ICD-10-CM | POA: Diagnosis not present

## 2019-04-05 DIAGNOSIS — E1129 Type 2 diabetes mellitus with other diabetic kidney complication: Secondary | ICD-10-CM | POA: Diagnosis not present

## 2019-04-05 DIAGNOSIS — K7689 Other specified diseases of liver: Secondary | ICD-10-CM | POA: Diagnosis not present

## 2019-04-05 DIAGNOSIS — Z7401 Bed confinement status: Secondary | ICD-10-CM | POA: Diagnosis not present

## 2019-04-05 DIAGNOSIS — I480 Paroxysmal atrial fibrillation: Secondary | ICD-10-CM | POA: Diagnosis not present

## 2019-04-05 DIAGNOSIS — E1122 Type 2 diabetes mellitus with diabetic chronic kidney disease: Secondary | ICD-10-CM | POA: Diagnosis not present

## 2019-04-05 DIAGNOSIS — Z992 Dependence on renal dialysis: Secondary | ICD-10-CM | POA: Diagnosis not present

## 2019-04-05 DIAGNOSIS — N186 End stage renal disease: Secondary | ICD-10-CM | POA: Diagnosis not present

## 2019-04-05 DIAGNOSIS — I120 Hypertensive chronic kidney disease with stage 5 chronic kidney disease or end stage renal disease: Secondary | ICD-10-CM | POA: Diagnosis not present

## 2019-04-05 DIAGNOSIS — R279 Unspecified lack of coordination: Secondary | ICD-10-CM | POA: Diagnosis not present

## 2019-04-05 DIAGNOSIS — J90 Pleural effusion, not elsewhere classified: Secondary | ICD-10-CM | POA: Diagnosis not present

## 2019-04-05 DIAGNOSIS — D631 Anemia in chronic kidney disease: Secondary | ICD-10-CM | POA: Diagnosis not present

## 2019-04-05 DIAGNOSIS — Z743 Need for continuous supervision: Secondary | ICD-10-CM | POA: Diagnosis not present

## 2019-04-05 DIAGNOSIS — E119 Type 2 diabetes mellitus without complications: Secondary | ICD-10-CM | POA: Diagnosis not present

## 2019-04-05 DIAGNOSIS — D696 Thrombocytopenia, unspecified: Secondary | ICD-10-CM | POA: Diagnosis not present

## 2019-04-05 DIAGNOSIS — D473 Essential (hemorrhagic) thrombocythemia: Secondary | ICD-10-CM | POA: Diagnosis not present

## 2019-04-05 DIAGNOSIS — E559 Vitamin D deficiency, unspecified: Secondary | ICD-10-CM | POA: Diagnosis not present

## 2019-04-05 DIAGNOSIS — H548 Legal blindness, as defined in USA: Secondary | ICD-10-CM | POA: Diagnosis not present

## 2019-04-05 DIAGNOSIS — U071 COVID-19: Secondary | ICD-10-CM | POA: Diagnosis not present

## 2019-04-05 DIAGNOSIS — Z951 Presence of aortocoronary bypass graft: Secondary | ICD-10-CM | POA: Diagnosis not present

## 2019-04-05 DIAGNOSIS — I4891 Unspecified atrial fibrillation: Secondary | ICD-10-CM | POA: Diagnosis not present

## 2019-04-05 DIAGNOSIS — E46 Unspecified protein-calorie malnutrition: Secondary | ICD-10-CM | POA: Diagnosis not present

## 2019-04-05 DIAGNOSIS — M2550 Pain in unspecified joint: Secondary | ICD-10-CM | POA: Diagnosis not present

## 2019-04-05 DIAGNOSIS — R531 Weakness: Secondary | ICD-10-CM | POA: Diagnosis not present

## 2019-04-05 DIAGNOSIS — Z79899 Other long term (current) drug therapy: Secondary | ICD-10-CM | POA: Diagnosis not present

## 2019-04-05 DIAGNOSIS — I953 Hypotension of hemodialysis: Secondary | ICD-10-CM | POA: Diagnosis not present

## 2019-04-05 DIAGNOSIS — Z7901 Long term (current) use of anticoagulants: Secondary | ICD-10-CM | POA: Diagnosis not present

## 2019-04-05 DIAGNOSIS — N2581 Secondary hyperparathyroidism of renal origin: Secondary | ICD-10-CM | POA: Diagnosis not present

## 2019-04-05 LAB — CULTURE, BLOOD (ROUTINE X 2)
Culture: NO GROWTH
Culture: NO GROWTH
Special Requests: ADEQUATE

## 2019-04-07 ENCOUNTER — Emergency Department (HOSPITAL_COMMUNITY): Payer: Medicare Other

## 2019-04-07 ENCOUNTER — Emergency Department (HOSPITAL_COMMUNITY)
Admission: EM | Admit: 2019-04-07 | Discharge: 2019-04-09 | Disposition: A | Discharge status: Home / Self Care | Payer: Medicare Other | Attending: Emergency Medicine | Admitting: Emergency Medicine

## 2019-04-07 ENCOUNTER — Encounter (HOSPITAL_COMMUNITY): Payer: Self-pay

## 2019-04-07 ENCOUNTER — Other Ambulatory Visit: Payer: Self-pay

## 2019-04-07 DIAGNOSIS — Z7901 Long term (current) use of anticoagulants: Secondary | ICD-10-CM | POA: Diagnosis not present

## 2019-04-07 DIAGNOSIS — I120 Hypertensive chronic kidney disease with stage 5 chronic kidney disease or end stage renal disease: Secondary | ICD-10-CM | POA: Diagnosis not present

## 2019-04-07 DIAGNOSIS — U071 COVID-19: Secondary | ICD-10-CM | POA: Insufficient documentation

## 2019-04-07 DIAGNOSIS — E1122 Type 2 diabetes mellitus with diabetic chronic kidney disease: Secondary | ICD-10-CM | POA: Diagnosis not present

## 2019-04-07 DIAGNOSIS — J90 Pleural effusion, not elsewhere classified: Secondary | ICD-10-CM | POA: Diagnosis not present

## 2019-04-07 DIAGNOSIS — I2510 Atherosclerotic heart disease of native coronary artery without angina pectoris: Secondary | ICD-10-CM | POA: Insufficient documentation

## 2019-04-07 DIAGNOSIS — N186 End stage renal disease: Secondary | ICD-10-CM | POA: Diagnosis not present

## 2019-04-07 DIAGNOSIS — Z79899 Other long term (current) drug therapy: Secondary | ICD-10-CM | POA: Diagnosis not present

## 2019-04-07 DIAGNOSIS — Z8546 Personal history of malignant neoplasm of prostate: Secondary | ICD-10-CM | POA: Insufficient documentation

## 2019-04-07 DIAGNOSIS — Z992 Dependence on renal dialysis: Secondary | ICD-10-CM | POA: Insufficient documentation

## 2019-04-07 DIAGNOSIS — R0602 Shortness of breath: Secondary | ICD-10-CM

## 2019-04-07 DIAGNOSIS — Z951 Presence of aortocoronary bypass graft: Secondary | ICD-10-CM | POA: Insufficient documentation

## 2019-04-07 LAB — PROTIME-INR
INR: 1.3 — ABNORMAL HIGH (ref 0.8–1.2)
Prothrombin Time: 16.3 seconds — ABNORMAL HIGH (ref 11.4–15.2)

## 2019-04-07 LAB — BASIC METABOLIC PANEL
Anion gap: 11 (ref 5–15)
BUN: 58 mg/dL — ABNORMAL HIGH (ref 8–23)
CO2: 27 mmol/L (ref 22–32)
Calcium: 8.2 mg/dL — ABNORMAL LOW (ref 8.9–10.3)
Chloride: 101 mmol/L (ref 98–111)
Creatinine, Ser: 7.43 mg/dL — ABNORMAL HIGH (ref 0.61–1.24)
GFR calc Af Amer: 8 mL/min — ABNORMAL LOW (ref >60)
GFR calc non Af Amer: 7 mL/min — ABNORMAL LOW (ref >60)
Glucose, Bld: 322 mg/dL — ABNORMAL HIGH (ref 70–99)
Potassium: 3.9 mmol/L (ref 3.5–5.1)
Sodium: 139 mmol/L (ref 135–145)

## 2019-04-07 LAB — CBC
HCT: 22.6 % — ABNORMAL LOW (ref 39.0–52.0)
Hemoglobin: 7.4 g/dL — ABNORMAL LOW (ref 13.0–17.0)
MCH: 36.3 pg — ABNORMAL HIGH (ref 26.0–34.0)
MCHC: 32.7 g/dL (ref 30.0–36.0)
MCV: 110.8 fL — ABNORMAL HIGH (ref 80.0–100.0)
Platelets: 50 10*3/uL — ABNORMAL LOW (ref 150–400)
RBC: 2.04 MIL/uL — ABNORMAL LOW (ref 4.22–5.81)
RDW: 15.8 % — ABNORMAL HIGH (ref 11.5–15.5)
WBC: 3.7 10*3/uL — ABNORMAL LOW (ref 4.0–10.5)
nRBC: 0 % (ref 0.0–0.2)

## 2019-04-07 LAB — POC OCCULT BLOOD, ED: Fecal Occult Bld: NEGATIVE

## 2019-04-07 MED ORDER — SEVELAMER CARBONATE 800 MG PO TABS
4800.0000 mg | ORAL_TABLET | Freq: Three times a day (TID) | ORAL | Status: DC
  Administered 2019-04-08: 4800 mg via ORAL
  Filled 2019-04-07 (×7): qty 6

## 2019-04-07 MED ORDER — MIDODRINE HCL 5 MG PO TABS
5.0000 mg | ORAL_TABLET | Freq: Three times a day (TID) | ORAL | Status: DC
  Administered 2019-04-08: 13:00:00 5 mg via ORAL
  Filled 2019-04-07 (×7): qty 1

## 2019-04-07 MED ORDER — SEVELAMER CARBONATE 800 MG PO TABS
2400.0000 mg | ORAL_TABLET | ORAL | Status: DC | PRN
  Filled 2019-04-07: qty 3

## 2019-04-07 MED ORDER — ALBUTEROL SULFATE HFA 108 (90 BASE) MCG/ACT IN AERS: 2.0000 | INHALATION_SPRAY | Freq: Four times a day (QID) | RESPIRATORY_TRACT | Status: DC | PRN

## 2019-04-07 MED ORDER — ATORVASTATIN CALCIUM 10 MG PO TABS: 10.0000 mg | ORAL_TABLET | Freq: Every day | ORAL | Status: DC

## 2019-04-07 MED ORDER — LINAGLIPTIN 5 MG PO TABS
5.0000 mg | ORAL_TABLET | Freq: Every day | ORAL | Status: DC
  Filled 2019-04-07 (×3): qty 1

## 2019-04-07 MED ORDER — INSULIN ASPART 100 UNIT/ML ~~LOC~~ SOLN: 0.0000 [IU] | Freq: Three times a day (TID) | SUBCUTANEOUS | Status: DC

## 2019-04-07 NOTE — ED Triage Notes (Signed)
Patient from [facility] for possible dialysis. Missed his regular appt yesterday, no complaints. Patient is blind and bed bound. No complaints.

## 2019-04-07 NOTE — Discharge Instructions (Addendum)
You are getting dialysis tomorrow.  Otherwise follow-up with your primary care physician.

## 2019-04-07 NOTE — ED Provider Notes (Signed)
[facility] EMERGENCY DEPARTMENT Provider Note   CSN: [id number] Arrival date & time: 04/07/19  1346     History No chief complaint on file.   [name] is a 69 y.o. male.  Patient with history of diabetes, ESRD on dialysis, recent hospital admission for Covid, legal blindness, hypertension, hyperlipidemia presenting from his facility with concern for needing dialysis.  Patient states he is normally a Monday Wednesday Friday dialysis patient but this week he has gone Tuesday, Thursday, and Saturday.  He apparently missed his session yesterday which was rescheduled for today.  His facility then sent him to the ED when it was not able to be performed.  Patient denies any complaints.  He has had no shortness of breath, chest pain, nausea, vomiting or fever.  He does not make any urine.  Is been on dialysis for the past 9 years.  He is uncertain when his next dialysis is scheduled.  No fever, chills, nausea or vomiting.  Discussed with nursing staff at facility.  Patient's last dialysis session was Thursday, December 31.  He missed his session yesterday for unknown reason.  They are not certain when he will be dialyzed again.  They believe it could be January 4 or 5.  The history is provided by the patient.       Past Medical History:  Diagnosis Date  . Anemia   . Arthritis    "back, neck" (07/28/2014)  . Blind    both eyes removed   . Bruises easily   . CAD, NATIVE VESSEL 01/08/2008      . Cellulitis late 1980's   "hospitalized; wrapped both legs; several times; no OR for this"  . Colon polyps   . Diabetic neuropathy ([place])   . Dialysis patient ([place])   . DM type 2 (diabetes mellitus, type 2) ([place])    no medications (07/28/2014) diet and excersie controlled not onmeds for 14-15 years   . Dysrhythmia    afib   . ESRD (end stage renal disease) on dialysis [facility])    [name]; Mon, Wed, Fri (07/28/2014)  . Fall    hx of fall and required left hip surgery   .  Family history of breast cancer    mother  . Heart murmur    hx of  . History of blood product transfusion   . HYPERLIPIDEMIA-MIXED 01/08/2008  . HYPERTENSION 01/27/2009   BP low , hx of HTN, Currently on meds to raise BP  . Hypotension   . Kidney stones   . Low blood pressure   . OVERWEIGHT/OBESITY 01/08/2008   Lost 205 lbs through diet and exercise.    . Peripheral vascular disease ([place])    vein stripping  . Pneumonia 2000;s X 1  . Prostate cancer ([place])   . Prosthetic eye globe    both eyes  . Renal cell carcinoma 2001 and 2003   "both kidneys"  . Renal insufficiency     Patient Active Problem List   Diagnosis Date Noted  . COVID-19 virus infection 03/14/2019  . Generalized weakness 03/14/2019  . Secondary and unspecified malignant neoplasm of intrathoracic lymph nodes ([place]) 11/15/2018  . Pressure injury of skin 03/21/2018  . Other pancytopenia ([place]) 03/17/2018  . CAP (community acquired pneumonia) 03/16/2018  . HCAP (healthcare-associated pneumonia) 03/16/2018  . Other persistent atrial fibrillation ([place]) 02/15/2018  . Renal cell cancer, right ([place]) 04/19/2017  . Macrocytic anemia 04/11/2016  . Diarrhea 04/11/2016  . Metastatic renal cell carcinoma (  [place]) 04/07/2016  . Hx of exploratory laparotomy 04/07/2016  . Shock circulatory ([place]) 04/07/2016  . Pre-operative cardiovascular examination 02/19/2016  . Bacteremia   . Abnormal CT of the abdomen   . Abdominal pain 11/01/2015  . UTI (lower urinary tract infection) 11/01/2015  . Sepsis ([place]) 11/01/2015  . Bilateral blindness 06/16/2015  . Carcinoma of kidney ([place]) 06/16/2015  . Type 2 diabetes mellitus ([place]) 06/16/2015  . Intra-abdominal infection 05/11/2015  . Chronic anemia 05/11/2015  . Infection in abdomen ([place]) 05/11/2015  . Second degree AV block, Mobitz type I 02/15/2015  . Atrioventricular block, Mobitz type 1, Wenckebach 02/15/2015  . Hypotension 02/14/2015  . Hypoglycemia 02/13/2015  . DM type 2, goal A1c  below 7 02/13/2015  . Blind   . DM type 2 (diabetes mellitus, type 2) ([place])   . Clear cell renal cell carcinoma (HCC)   . CAD in native artery 09/10/2014  . Renal carcinoma ([place])   . Non-intractable cyclical vomiting with nausea   . Hydronephrosis 07/28/2014  . Coccyx pain 01/03/2014  . Thrombocytopenia ([place]) 01/03/2014  . Intertrochanteric fracture of left hip ([place]) 12/30/2013  . Dialysis patient ([place]) 12/30/2013  . Hip fracture ([place]) 12/30/2013  . Dependence on renal dialysis ([place]) 12/30/2013  . Hypotension of hemodialysis 06/19/2013  . Malignant neoplasm of prostate ([place]) 01/30/2012  . Diabetes mellitus ([place]) 08/30/2011  . Peripheral vascular disease ([place]) 05/27/2009  . COLONIC POLYPS 02/12/2009  . ANEMIA 01/27/2009  . GLAUCOMA 01/27/2009  . Essential hypertension 01/27/2009  . ESRD (end stage renal disease) on dialysis ([place]) 01/27/2009  . End stage renal failure on dialysis ([place]) 01/27/2009  . DIAB W/UNS COMP TYPE II/UNS NOT STATED UNCNTRL 01/08/2008  . HYPERLIPIDEMIA-MIXED 01/08/2008  . OVERWEIGHT/OBESITY 01/08/2008  . DEPRESSIVE DISORDER NOT ELSEWHERE CLASSIFIED 01/08/2008  . Hx of CABG-2013 01/08/2008  . EDEMA 01/08/2008  . Clinical depression 01/08/2008  . HLD (hyperlipidemia) 01/08/2008    Past Surgical History:  Procedure Laterality Date  . AV FISTULA PLACEMENT Left 02/2010   LFA  . AV FISTULA PLACEMENT Left 01/12/2016   Procedure: LEFT BRACHIOCEPHALIC ARTERIOVENOUS (AV) FISTULA CREATION;  Surgeon: Angelia Mould, MD;  Location: [facility];  Service: Vascular;  Laterality: Left;  . CHOLECYSTECTOMY OPEN  1992  . COLONOSCOPY  06/14/2011   Procedure: COLONOSCOPY;  Surgeon: [name], MD;  Location: [facility];  Service: Endoscopy;  Laterality: N/A;  . COLONOSCOPY N/A 07/24/2012   Procedure: COLONOSCOPY;  Surgeon: [name], MD;  [facility];  Service: Endoscopy;  Laterality: N/A;  . COLONOSCOPY    . COLONOSCOPY N/A 11/04/2015   Procedure:  COLONOSCOPY;  Surgeon: Manus Gunning, MD;  Location[facility]COPY;  Service: Gastroenterology;  Laterality: N/A;  . COLONOSCOPY WITH PROPOFOL N/A 05/13/2014   Procedure: COLONOSCOPY WITH PROPOFOL;  Surgeon: [name], MD;  [facility];  Service: Endoscopy;  Laterality: N/A;  . CORONARY ARTERY BYPASS GRAFT  09/29/2011   Procedure: CORONARY ARTERY BYPASS GRAFTING (CABG);  Surgeon: [name], MD;  Location: [facility];  Service: Open Heart Surgery;  Laterality: N/A;  Coronary Artery Bypass Graft times two utilizing the left internal mammary artery and the right greater saphenous vein harvested endoscopically.  . CYSTOSCOPY WITH RETROGRADE PYELOGRAM, URETEROSCOPY AND STENT PLACEMENT Left 07/28/2014   Procedure: CYSTOSCOPY WITH RETROGRADE PYELOGRAM, URETERAL BALLOON DILITATION, URETEROSCOPY AND LEFT STENT PLACEMENT;  Surgeon: [name], MD;  [facility];  Service: Urology;  Laterality: Left;  . ENUCLEATION  2003; 2006  bilateral; "diabetes; pain"  . FOOT AMPUTATION Right ~ 2002   right;  partial; "infection"  . FRACTURE SURGERY     hip fx  . HIP PINNING,CANNULATED Left 12/31/2013   Procedure: CANNULATED HIP PINNING;  Surgeon: Renette Butters, MD;  Location: [facility];  Service: Orthopedics;  Laterality: Left;  Carm, FX Table, [employer]  . HOT HEMOSTASIS  06/14/2011   Procedure: HOT HEMOSTASIS (ARGON PLASMA COAGULATION/BICAP);  Surgeon: [name], MD;  [facility];  Service: Endoscopy;  Laterality: N/A;  . INSERTION OF DIALYSIS CATHETER Right 01/12/2016   Procedure: INSERTION OF DIALYSIS CATHETER;  Surgeon: Angelia Mould, MD;  Location: [facility];  Service: Vascular;  Laterality: Right;  . INSERTION PROSTATE RADIATION SEED  03/2012  . IR GENERIC HISTORICAL  01/13/2016   IR RADIOLOGIST EVAL & MGMT 01/13/2016 Aletta Edouard, MD [facility] INTERV RAD  . IR RADIOLOGIST EVAL & MGMT  03/02/2017  . IR RADIOLOGIST EVAL & MGMT  05/18/2017  . IR RADIOLOGIST EVAL & MGMT   07/27/2017  . LAPAROTOMY N/A 04/07/2016   Procedure: EXPLORATORY LAPAROTOMY AND RESECTION OF RETROPERITONEAL MASS;  Surgeon: Raynelle Bring, MD;  [facility];  Service: Urology;  Laterality: N/A;  . LEFT HEART CATHETERIZATION WITH CORONARY ANGIOGRAM N/A 09/27/2011   Procedure: LEFT HEART CATHETERIZATION WITH CORONARY ANGIOGRAM;  Surgeon: [name], MD;  Location: [facility] CATH LAB;  Service: Cardiovascular;  Laterality: N/A;  . LIGATION OF ARTERIOVENOUS  FISTULA Left 01/12/2016   Procedure: LIGATION OF LEFT RADIOCEPHALIC ARTERIOVENOUS  FISTULA;  Surgeon: Angelia Mould, MD;  Location: [facility];  Service: Vascular;  Laterality: Left;  . LIGATION OF COMPETING BRANCHES OF ARTERIOVENOUS FISTULA Left 07/29/2014   Procedure: LIGATION OF COMPETING BRANCHES OF LEFT ARM ARTERIOVENOUS FISTULA;  Surgeon: [name], MD;  Location: [facility];  Service: Vascular;  Laterality: Left;  . NEPHRECTOMY Right 01/30/2015   done at [facility]  . PARTIAL COLECTOMY Right 04/07/2016   Procedure: RIGHT COLECTOMY AND PARTIAL LIVER RESECTION;  Surgeon: Raynelle Bring, MD;  [facility];  Service: Urology;  Laterality: Right;  . RADIOFREQUENCY ABLATION N/A 04/19/2017   Procedure: CT MICROWAVE THERMAL ABLATION;  Surgeon: Aletta Edouard, MD;  [facility];  Service: Anesthesiology;  Laterality: N/A;  . RADIOFREQUENCY ABLATION KIDNEY  2010-2012   "twice; one on each side; for cancer"  . REVISON OF ARTERIOVENOUS FISTULA Left 09/01/2015   Procedure: EXPLORATION LEFT LOWER ARM  RADIOCEPHALIC ARTERIOVENOUS FISTULA;  Surgeon: Conrad Baudette, MD;  Location: [facility];  Service: Vascular;  Laterality: Left;  [name] VARICOSE VEIN SURGERY  mid 1980's    BLE; "knees down; both legs; 2 separate times"       Family History  Problem Relation Age of Onset  . Cancer Mother 58       breast, spine and ovarian  . Heart disease Mother   . Hyperlipidemia Mother   . Hypertension Mother   . Varicose Veins Mother   . Emphysema Father    . Cancer Father 18       kidney, prostate  . Heart disease Father   . Hyperlipidemia Father   . Hypertension Father   . Cancer Sister        ovarian  . Cancer Brother        lung  . Heart disease Brother   . Hyperlipidemia Brother   . Hypertension Brother   . Heart attack Brother   . Peripheral vascular disease Brother   . Cancer Other   .  Coronary artery disease Other   . Kidney disease Other   . Breast cancer Other   . Ovarian cancer Other   . Lung cancer Other   . Diabetes Other   . Malignant hyperthermia Neg Hx     Social History   Tobacco Use  . Smoking status: Never Smoker  . Smokeless tobacco: Never Used  Substance Use Topics  . Alcohol use: No    Alcohol/week: 0.0 standard drinks  . Drug use: No    Home Medications Prior to Admission medications   Medication Sig Start Date End Date Taking? Authorizing Provider  acetaminophen (TYLENOL) 325 MG tablet Take 2 tablets (650 mg total) by mouth every 6 (six) hours as needed for mild pain or headache (fever >/= 101). 03/28/19   Pokhrel, Corrie Mckusick, MD  albuterol (VENTOLIN HFA) 108 (90 Base) MCG/ACT inhaler Inhale 2 puffs into the lungs every 6 (six) hours as needed for wheezing or shortness of breath. 03/28/19   Pokhrel, Corrie Mckusick, MD  Amino Acids-Protein Hydrolys (FEEDING SUPPLEMENT, PRO-STAT SUGAR FREE 64,) LIQD Take 30 mLs by mouth 2 (two) times daily. 03/28/19   Pokhrel, Corrie Mckusick, MD  atorvastatin (LIPITOR) 10 MG tablet Take 10 mg by mouth daily.    [provider]  benzonatate (TESSALON) 100 MG capsule Take 100 mg by mouth every 8 (eight) hours as needed for cough.    [provider]  cyclobenzaprine (FLEXERIL) 10 MG tablet Take 10 mg by mouth every 12 (twelve) hours as needed. Neck pain 09/26/18   [provider]  ferrous sulfate 325 (65 FE) MG tablet Take 325 mg by mouth See admin instructions. 5am on Monday, Wednesday, Friday 8am on Sunday, Tuesday, Thursday, Saturday    [provider]   JANUVIA 25 MG tablet Take 25 mg by mouth See admin instructions. 8am on Sunday, Tuesday, Thursday, Saturday 5am on [place], Wednesday, Friday 03/04/19   [provider]  midodrine (PROAMATINE) 5 MG tablet Take 1 tablet (5 mg total) by mouth 3 (three) times daily with meals. 03/28/19   Pokhrel, Corrie Mckusick, MD  multivitamin (RENA-VIT) TABS tablet Take 1 tablet by mouth daily.    [provider]  Nutritional Supplements (FEEDING SUPPLEMENT, NEPRO CARB STEADY,) LIQD Take 237 mLs by mouth 2 (two) times daily between meals. 03/28/19   Pokhrel, Corrie Mckusick, MD  ondansetron (ZOFRAN) 4 MG tablet Take 1 tablet (4 mg total) by mouth every 6 (six) hours as needed for nausea. 03/28/19   Pokhrel, Corrie Mckusick, MD  Probiotic Product (ALIGN) 4 MG CAPS Take 1 capsule by mouth See admin instructions. Take at 8am on Sunday, Tuesday, Thursday, Saturday Take at 5am on Monday, Wednesday, Friday    [provider]  sevelamer carbonate (RENVELA) 800 MG tablet Take 2,400-4,800 mg by mouth See admin instructions. Take 6 tablets at 8am (4800 mg) by mouth 3 times daily with meals on Tuesday, Thursday, Saturday, Sunday Take 6 tablets at 5am (4800 mg) by mouth 3 times daily with meals on Monday, Wednesday, Friday  Take 3 tablets (2400 mg) 2 times daily with snacks 06/26/14   [provider]  warfarin (COUMADIN) 1 MG tablet Take 1 tablet (1 mg total) by mouth daily. Adjust for INR 2-3, hold for 12/24 and 12/25 and start on 03/30/19 if INR is between 2-3 03/30/19 03/29/20  Pokhrel, Corrie Mckusick, MD    Allergies    Codeine and Tape  Review of Systems   Review of Systems  Constitutional: Negative for activity change, appetite change and fever.  HENT:  Negative for congestion.   Eyes: Negative for visual disturbance.  Respiratory: Negative for cough, chest tightness and shortness of breath.   Cardiovascular: Negative for chest pain.  Gastrointestinal: Negative for abdominal pain, nausea and vomiting.   Genitourinary: Negative for dysuria and hematuria.  Musculoskeletal: Negative for arthralgias and myalgias.  Skin: Negative for rash.  Neurological: Negative for dizziness, weakness and headaches.   all other systems are negative except as noted in the HPI and PMH.    Physical Exam Updated Vital Signs BP (!) 135/56 (BP Location: Right Arm)   Pulse (!) 55   Temp 98.4 F (36.9 C) (Oral)   Resp 20   SpO2 97%   Physical Exam Vitals and nursing note reviewed.  Constitutional:      General: He is not in acute distress.    Appearance: He is well-developed.     Comments: Chronically ill-appearing  HENT:     Head: Normocephalic and atraumatic.     Mouth/Throat:     Pharynx: No oropharyngeal exudate.  Eyes:     Conjunctiva/sclera: Conjunctivae normal.     Pupils: Pupils are equal, round, and reactive to light.     Comments: Prosthetic globes  Neck:     Comments: No meningismus. Cardiovascular:     Rate and Rhythm: Normal rate and regular rhythm.     Heart sounds: Normal heart sounds. No murmur.  Pulmonary:     Effort: Pulmonary effort is normal. No respiratory distress.     Breath sounds: Rhonchi present.  Abdominal:     Palpations: Abdomen is soft.     Tenderness: There is no abdominal tenderness. There is no guarding or rebound.  Musculoskeletal:        General: No tenderness. Normal range of motion.     Cervical back: Normal range of motion and neck supple.     Comments: Dialysis fistual LUE with thrill and bruit.   Skin:    General: Skin is warm.  Neurological:     Mental Status: He is alert and oriented to person, place, and time.     Cranial Nerves: No cranial nerve deficit.     Motor: No abnormal muscle tone.     Coordination: Coordination normal.     Comments: No ataxia on finger to nose bilaterally. No pronator drift. 5/5 strength throughout. CN 2-12 intact.Equal grip strength. Sensation intact.   Psychiatric:        Behavior: Behavior normal.     ED Results  / Procedures / Treatments   Labs (all labs ordered are listed, but only abnormal results are displayed) Labs Reviewed  CBC - Abnormal; Notable for the following components:      Result Value   WBC 3.7 (*)    RBC 2.04 (*)    Hemoglobin 7.4 (*)    HCT 22.6 (*)    MCV 110.8 (*)    MCH 36.3 (*)    RDW 15.8 (*)    Platelets 50 (*)    All other components within normal limits  BASIC METABOLIC PANEL - Abnormal; Notable for the following components:   Glucose, Bld 322 (*)    BUN 58 (*)    Creatinine, Ser 7.43 (*)    Calcium 8.2 (*)    GFR calc non Af Amer 7 (*)    GFR calc Af Amer 8 (*)    All other components within normal limits  PROTIME-INR  POC OCCULT BLOOD, ED  TYPE AND SCREEN    EKG None  Radiology  DG Chest 1 View  Result Date: 04/07/2019 CLINICAL DATA:  Missed dialysis. EXAM: CHEST  1 VIEW COMPARISON:  March 30, 2019 FINDINGS: There are bilateral pleural effusions, right greater than left. Right lower lobe and right mid lung zone airspace disease is again noted. The patient is status post prior median sternotomy. Aortic calcifications are noted. The heart is borderline enlarged. There is no pneumothorax. IMPRESSION: 1. Persistent small bilateral pleural effusions, right greater than left. 2. Relatively unchanged airspace disease involving the right mid and right lower lung zones. Findings may represent atelectasis or developing infiltrate. Electronically Signed   By: Constance Holster M.D.   On: 04/07/2019 21:20    Procedures Procedures (including critical care time)  Medications Ordered in ED Medications - No data to display  ED Course  I have reviewed the triage vital signs and the nursing notes.  Pertinent labs & imaging results that were available during my care of the patient were reviewed by me and considered in my medical decision making (see chart for details).    MDM Rules/Calculators/A&P                     Patient sent from facility with concern for  needing dialysis.  He denies complaints.  His potassium is 3.9.    He is not in any respiratory distress.  Patient remained stable in the ED.  No respiratory distress or increased work of breathing.  No hypoxia.  Hemoglobin has downtrended some to 7.4 from a baseline of about 9.  Patient does take Coumadin.  Hemoccult is negative.  Patient does not appear to need emergent dialysis.  It is concerning to the patient that his nursing facility does not know when his next dialysis session will be.  He is not happy with his care there is does not want to go back. Transition of care team will be consulted  Patient remained stable in the ED.  Holding orders are placed.  He will await evaluation by the transition of  care team. No indication for emergent dialysis at this time    final Clinical Impression(s) / ED Diagnoses Final diagnoses:  SOB (shortness of breath)    Rx / DC Orders ED Discharge Orders    None       Marven Veley, Annie Main, MD 04/07/19 2356

## 2019-04-07 NOTE — ED Notes (Signed)
Pt is located in the peds triage area. Pt is still upset at having to wait to get Dialysis.

## 2019-04-07 NOTE — ED Notes (Signed)
Pt brief changed. Peri care provided.

## 2019-04-07 NOTE — ED Notes (Signed)
Patient transported to X-ray 

## 2019-04-08 LAB — CBC
HCT: 24.6 % — ABNORMAL LOW (ref 39.0–52.0)
Hemoglobin: 8.2 g/dL — ABNORMAL LOW (ref 13.0–17.0)
MCH: 36 pg — ABNORMAL HIGH (ref 26.0–34.0)
MCHC: 33.3 g/dL (ref 30.0–36.0)
MCV: 107.9 fL — ABNORMAL HIGH (ref 80.0–100.0)
Platelets: 59 10*3/uL — ABNORMAL LOW (ref 150–400)
RBC: 2.28 MIL/uL — ABNORMAL LOW (ref 4.22–5.81)
RDW: 15.4 % (ref 11.5–15.5)
WBC: 4 10*3/uL (ref 4.0–10.5)
nRBC: 0 % (ref 0.0–0.2)

## 2019-04-08 LAB — BASIC METABOLIC PANEL
Anion gap: 15 (ref 5–15)
BUN: 72 mg/dL — ABNORMAL HIGH (ref 8–23)
CO2: 27 mmol/L (ref 22–32)
Calcium: 8.3 mg/dL — ABNORMAL LOW (ref 8.9–10.3)
Chloride: 98 mmol/L (ref 98–111)
Creatinine, Ser: 8.35 mg/dL — ABNORMAL HIGH (ref 0.61–1.24)
GFR calc Af Amer: 7 mL/min — ABNORMAL LOW (ref >60)
GFR calc non Af Amer: 6 mL/min — ABNORMAL LOW (ref >60)
Glucose, Bld: 179 mg/dL — ABNORMAL HIGH (ref 70–99)
Potassium: 4.4 mmol/L (ref 3.5–5.1)
Sodium: 140 mmol/L (ref 135–145)

## 2019-04-08 LAB — RESPIRATORY PANEL BY RT PCR (FLU A&B, COVID)
Influenza A by PCR: NEGATIVE
Influenza B by PCR: NEGATIVE
SARS Coronavirus 2 by RT PCR: POSITIVE — AB

## 2019-04-08 LAB — CBG MONITORING, ED: Glucose-Capillary: 142 mg/dL — ABNORMAL HIGH (ref 70–99)

## 2019-04-08 MED ORDER — WARFARIN - PHARMACIST DOSING INPATIENT: Freq: Every day | Status: DC

## 2019-04-08 MED ORDER — WARFARIN SODIUM 2.5 MG PO TABS
2.5000 mg | ORAL_TABLET | Freq: Once | ORAL | Status: DC
  Filled 2019-04-08 (×2): qty 1

## 2019-04-08 MED ORDER — WARFARIN SODIUM 2.5 MG PO TABS
2.5000 mg | ORAL_TABLET | Freq: Once | ORAL | Status: AC
  Filled 2019-04-08: qty 1

## 2019-04-08 NOTE — Progress Notes (Signed)
ANTICOAGULATION CONSULT NOTE - Initial Consult  Pharmacy Consult for Coumadin Indication: atrial fibrillation  Allergies  Allergen Reactions  . Codeine Other (See Comments)     [place]  . Tape Other (See Comments)    Plastic tape tears skin off, please use paper tape instead.    Patient Measurements:    Vital Signs: Temp: 97.8 F (36.6 C) (01/04 0700) Temp Source: Oral (01/04 0700) BP: 101/62 (01/04 1230) Pulse Rate: 58 (01/04 1245)  Labs: Recent Labs    04/07/19 1359 04/07/19 2307 04/08/19 0803  HGB 7.4*  --  8.2*  HCT 22.6*  --  24.6*  PLT 50*  --  59*  LABPROT  --  16.3*  --   INR  --  1.3*  --   CREATININE 7.43*  --  8.35*    CrCl cannot be calculated (Unknown ideal weight.).   Medical History: Past Medical History:  Diagnosis Date  . Anemia   . Arthritis    "back, neck" (07/28/2014)  . Blind    both eyes removed   . Bruises easily   . CAD, NATIVE VESSEL 01/08/2008      . Cellulitis late 1980's   "hospitalized; wrapped both legs; several times; no OR for this"  . Colon polyps   . Diabetic neuropathy ([place])   . Dialysis patient ([place])   . DM type 2 (diabetes mellitus, type 2) ([place])    no medications (07/28/2014) diet and excersie controlled not onmeds for 14-15 years   . Dysrhythmia    afib   . ESRD (end stage renal disease) on dialysis [facility])    [name]; Mon, Wed, Fri (07/28/2014)  . Fall    hx of fall and required left hip surgery   . Family history of breast cancer    mother  . Heart murmur    hx of  . History of blood product transfusion   . HYPERLIPIDEMIA-MIXED 01/08/2008  . HYPERTENSION 01/27/2009   BP low , hx of HTN, Currently on meds to raise BP  . Hypotension   . Kidney stones   . Low blood pressure   . OVERWEIGHT/OBESITY 01/08/2008   Lost 205 lbs through diet and exercise.    . Peripheral vascular disease ([place])    vein stripping  . Pneumonia 2000;s X 1  . Prostate cancer ([place])   . Prosthetic eye globe    both eyes  . Renal cell carcinoma 2001 and 2003   "both kidneys"  . Renal insufficiency     Medications:  No current facility-administered medications on file prior to encounter.   Current Outpatient Medications on File Prior to Encounter  Medication Sig Dispense Refill  . acetaminophen (TYLENOL) 325 MG tablet Take 2 tablets (650 mg total) by mouth every 6 (six) hours as needed for mild pain or headache (fever >/= 101).    [name] albuterol (VENTOLIN HFA) 108 (90 Base) MCG/ACT inhaler Inhale 2 puffs into the lungs every 6 (six) hours as needed for wheezing or shortness of breath.    . Amino Acids-Protein Hydrolys (FEEDING SUPPLEMENT, PRO-STAT SUGAR FREE 64,) LIQD Take 30 mLs by mouth 2 (two) times daily. 887 mL 0  . atorvastatin (LIPITOR) 10 MG tablet Take 10 mg by mouth daily.    . benzonatate (TESSALON) 100 MG capsule Take 100 mg by mouth every 8 (eight) hours as needed for cough.    . cyclobenzaprine (FLEXERIL) 10 MG tablet Take 10 mg by mouth every 12 (twelve) hours as  needed. Neck pain    . ferrous sulfate 325 (65 FE) MG tablet Take 325 mg by mouth See admin instructions. 5am on Monday, Wednesday, Friday 8am on Sunday, Tuesday, Thursday, Saturday    . JANUVIA 25 MG tablet Take 25 mg by mouth See admin instructions. 8am on Sunday, Tuesday, Thursday, Saturday 5am on [place], Wednesday, Friday    . midodrine (PROAMATINE) 5 MG tablet Take 1 tablet (5 mg total) by mouth 3 (three) times daily with meals.    . multivitamin (RENA-VIT) TABS tablet Take 1 tablet by mouth daily.    . Nutritional Supplements (FEEDING SUPPLEMENT, NEPRO CARB STEADY,) LIQD Take 237 mLs by mouth 2 (two) times daily between meals.  0  . ondansetron (ZOFRAN) 4 MG tablet Take 1 tablet (4 mg total) by mouth every 6 (six) hours as needed for nausea. 20 tablet 0  . Probiotic Product (ALIGN) 4 MG CAPS Take 1 capsule by mouth See admin instructions. Take at 8am on Sunday, Tuesday, Thursday, Saturday Take at 5am on Monday, Wednesday,  Friday    . sevelamer carbonate (RENVELA) 800 MG tablet Take 2,400-4,800 mg by mouth See admin instructions. Take 6 tablets at 8am (4800 mg) by mouth 3 times daily with meals on Tuesday, Thursday, Saturday, Sunday Take 6 tablets at 5am (4800 mg) by mouth 3 times daily with meals on Monday, Wednesday, Friday  Take 3 tablets (2400 mg) 2 times daily with snacks    . warfarin (COUMADIN) 1 MG tablet Take 1 tablet (1 mg total) by mouth daily. Adjust for INR 2-3, hold for 12/24 and 12/25 and start on 03/30/19 if INR is between 2-3       Assessment: 69 y.o. male with h/o Afib for Coumadin Goal of Therapy:  INR 2-3 Monitor platelets by anticoagulation protocol: Yes   Plan:  - Night RN re-timed Warfarin dose that was due at 0030 to 0800  - Dose was not given to the patient in the AM  - Will dose Warfarin 2.5mg  again tonight  - Daily INR and monitor CBC   Duanne Limerick PharmD. BCPS  04/08/2019,12:58 PM

## 2019-04-08 NOTE — Progress Notes (Addendum)
12pm: Patient can be discharged to [facility] at this time via [place].  11am: CSW spoke with Freda Munro at [facility] who confirms this patient can return to [facility] at discharge.  Per Jaclyn Shaggy, Renal Navigator patient is scheduled for HD for tomorrow at [facility] at 12pm. Patient will need PTAR for transportation.  9am: CSW received consult for patient due to him being in ED from [facility]. Patient got off his normal HD schedule this week and came to ED to receive an evaluation.   Patient states agreement to return to [facility] if they are able to coordinate his HD treatments and keep him on schedule.  CSW reached out to Renal Navigator Colleen to request her assistance. Colleen to return call to CSW after progression.  Madilyn Fireman, MSW, LCSW-A Transitions of Care  Clinical Social Worker  [facility] [facility] Emergency Departments  Medical ICU [phone]

## 2019-04-08 NOTE — Progress Notes (Signed)
ANTICOAGULATION CONSULT NOTE - Initial Consult  Pharmacy Consult for Coumadin Indication: atrial fibrillation  Allergies  Allergen Reactions  . Codeine Other (See Comments)     [place]is  . Tape Other (See Comments)    Plastic tape tears skin off, please use paper tape instead.    Patient Measurements:    Vital Signs: Temp: 98.4 F (36.9 C) (01/03 1406) Temp Source: Oral (01/03 1406) BP: 135/56 (01/03 1941) Pulse Rate: 55 (01/03 1941)  Labs: Recent Labs    04/07/19 1359 04/07/19 2307  HGB 7.4*  --   HCT 22.6*  --   PLT 50*  --   LABPROT  --  16.3*  INR  --  1.3*  CREATININE 7.43*  --     CrCl cannot be calculated (Unknown ideal weight.).   Medical History: Past Medical History:  Diagnosis Date  . Anemia   . Arthritis    "back, neck" (07/28/2014)  . Blind    both eyes removed   . Bruises easily   . CAD, NATIVE VESSEL 01/08/2008      . Cellulitis late 1980's   "hospitalized; wrapped both legs; several times; no OR for this"  . Colon polyps   . Diabetic neuropathy ([place])   . Dialysis patient ([place])   . DM type 2 (diabetes mellitus, type 2) ([place])    no medications (07/28/2014) diet and excersie controlled not onmeds for 14-15 years   . Dysrhythmia    afib   . ESRD (end stage renal disease) on dialysis [facility])    [name]; Mon, Wed, Fri (07/28/2014)  . Fall    hx of fall and required left hip surgery   . Family history of breast cancer    mother  . Heart murmur    hx of  . History of blood product transfusion   . HYPERLIPIDEMIA-MIXED 01/08/2008  . HYPERTENSION 01/27/2009   BP low , hx of HTN, Currently on meds to raise BP  . Hypotension   . Kidney stones   . Low blood pressure   . OVERWEIGHT/OBESITY 01/08/2008   Lost 205 lbs through diet and exercise.    . Peripheral vascular disease ([place])    vein stripping  . Pneumonia 2000;s X 1  . Prostate cancer ([place])   . Prosthetic eye globe    both eyes  . Renal cell carcinoma 2001 and 2003    "both kidneys"  . Renal insufficiency     Medications:  No current facility-administered medications on file prior to encounter.   Current Outpatient Medications on File Prior to Encounter  Medication Sig Dispense Refill  . acetaminophen (TYLENOL) 325 MG tablet Take 2 tablets (650 mg total) by mouth every 6 (six) hours as needed for mild pain or headache (fever >/= 101).    [name] albuterol (VENTOLIN HFA) 108 (90 Base) MCG/ACT inhaler Inhale 2 puffs into the lungs every 6 (six) hours as needed for wheezing or shortness of breath.    . Amino Acids-Protein Hydrolys (FEEDING SUPPLEMENT, PRO-STAT SUGAR FREE 64,) LIQD Take 30 mLs by mouth 2 (two) times daily. 887 mL 0  . atorvastatin (LIPITOR) 10 MG tablet Take 10 mg by mouth daily.    . benzonatate (TESSALON) 100 MG capsule Take 100 mg by mouth every 8 (eight) hours as needed for cough.    . cyclobenzaprine (FLEXERIL) 10 MG tablet Take 10 mg by mouth every 12 (twelve) hours as needed. Neck pain    . ferrous sulfate 325 (65  FE) MG tablet Take 325 mg by mouth See admin instructions. 5am on Monday, Wednesday, Friday 8am on Sunday, Tuesday, Thursday, Saturday    . JANUVIA 25 MG tablet Take 25 mg by mouth See admin instructions. 8am on Sunday, Tuesday, Thursday, Saturday 5am on Mondau, Wednesday, Friday    . midodrine (PROAMATINE) 5 MG tablet Take 1 tablet (5 mg total) by mouth 3 (three) times daily with meals.    . multivitamin (RENA-VIT) TABS tablet Take 1 tablet by mouth daily.    . Nutritional Supplements (FEEDING SUPPLEMENT, NEPRO CARB STEADY,) LIQD Take 237 mLs by mouth 2 (two) times daily between meals.  0  . ondansetron (ZOFRAN) 4 MG tablet Take 1 tablet (4 mg total) by mouth every 6 (six) hours as needed for nausea. 20 tablet 0  . Probiotic Product (ALIGN) 4 MG CAPS Take 1 capsule by mouth See admin instructions. Take at 8am on Sunday, Tuesday, Thursday, Saturday Take at 5am on Monday, Wednesday, Friday    . sevelamer carbonate (RENVELA) 800  MG tablet Take 2,400-4,800 mg by mouth See admin instructions. Take 6 tablets at 8am (4800 mg) by mouth 3 times daily with meals on Tuesday, Thursday, Saturday, Sunday Take 6 tablets at 5am (4800 mg) by mouth 3 times daily with meals on Monday, Wednesday, Friday  Take 3 tablets (2400 mg) 2 times daily with snacks    . warfarin (COUMADIN) 1 MG tablet Take 1 tablet (1 mg total) by mouth daily. Adjust for INR 2-3, hold for 12/24 and 12/25 and start on 03/30/19 if INR is between 2-3       Assessment: 69 y.o. male with h/o Afib for Coumadin Goal of Therapy:  INR 2-3 Monitor platelets by anticoagulation protocol: Yes   Plan:  Coumadin 2.5 mg tonight F/U daily INR  Caryl Pina 04/08/2019,12:16 AM

## 2019-04-08 NOTE — ED Notes (Signed)
Pt had on large blood opressure cuff and bp was low at 78/53 dr Regenia Skeeter into see pt and bp retaken and it was 101 76 PTAR here to get pt

## 2019-04-08 NOTE — ED Notes (Signed)
Pt pulled up in  Chair thT [place] ARRIVING IN MY ROOM

## 2019-04-08 NOTE — Progress Notes (Signed)
Renal Navigator received call from ED CSW/C. Shon Baton regarding HD and disposition plan for patient. Renal Navigator spoke with Quarry manager at [employer] [employer] [facility] who states patient missed his 1st OP HD treatment scheduled at his home clinic/[facility] (since treating at [place] shift) and is now in the ED. Since he tested positive in the ED today (though he had just received 2 negative tests at [facility]) his home clinic Medical Director will not permit him to return to [facility] at this time. Therefore, he will need to continue treating at [facility] on the TTS 12:00pm seat schedule.  Renal Navigator spoke with Dr. Bhandari/Nephrologist as ED provider was asking if patient needed HD here prior to discharge and SNF was asking that patient receive HD here prior to discharge, per CSW. Per Dr. Carolin Sicks, patient safe for discharge today with next HD treatment at OP HD clinic tomorrow. Renal Navigator informed CSW and asked that she inform [facility]. [place] will need to arrange PTAR for transport, as to Renal Navigator's knowledge, they do not have their own transportation.  Alphonzo Cruise, [place] Renal Navigator [phone]

## 2019-04-08 NOTE — ED Notes (Signed)
Called ptar at 12:58

## 2019-04-08 NOTE — ED Provider Notes (Signed)
Patient is boarding in the ED waiting on dialysis/social work consult.  After discussion with social work, they have talked to the renal navigator and he can get dialysis tomorrow.  Labs were repeated this morning and showed no significant electrolyte abnormality and his hemoglobin is improved from yesterday.  Vitals are stable.  He appears stable for discharge back to his facility and when talking to social work, he has no problem going back to [facility].  Discharge.   Sherwood Gambler, MD 04/08/19 1200

## 2019-04-08 NOTE — ED Notes (Signed)
MEDS ORDERED FROM [facility]

## 2019-04-08 NOTE — ED Notes (Signed)
Brain Dehaas son  wabts updates [phone]

## 2019-04-08 NOTE — ED Notes (Signed)
Pt received from the hallway , pt in  [facility]cl[place] pt settled in room given call bell , pt is blind states he was at [facility] and was sent to  [facility] after a fall, per nurse giving report pt doesn't  Want to go back there and there is a social work consult , unknown when last dialysis was

## 2019-04-09 DIAGNOSIS — E1129 Type 2 diabetes mellitus with other diabetic kidney complication: Secondary | ICD-10-CM | POA: Diagnosis not present

## 2019-04-09 DIAGNOSIS — Z992 Dependence on renal dialysis: Secondary | ICD-10-CM | POA: Diagnosis not present

## 2019-04-09 DIAGNOSIS — U071 COVID-19: Secondary | ICD-10-CM | POA: Diagnosis not present

## 2019-04-09 DIAGNOSIS — R531 Weakness: Secondary | ICD-10-CM | POA: Diagnosis not present

## 2019-04-09 DIAGNOSIS — N2581 Secondary hyperparathyroidism of renal origin: Secondary | ICD-10-CM | POA: Diagnosis not present

## 2019-04-09 DIAGNOSIS — E119 Type 2 diabetes mellitus without complications: Secondary | ICD-10-CM | POA: Diagnosis not present

## 2019-04-09 DIAGNOSIS — I4891 Unspecified atrial fibrillation: Secondary | ICD-10-CM | POA: Diagnosis not present

## 2019-04-09 DIAGNOSIS — N186 End stage renal disease: Secondary | ICD-10-CM | POA: Diagnosis not present

## 2019-04-09 DIAGNOSIS — K7689 Other specified diseases of liver: Secondary | ICD-10-CM | POA: Diagnosis not present

## 2019-04-11 DIAGNOSIS — E119 Type 2 diabetes mellitus without complications: Secondary | ICD-10-CM | POA: Diagnosis not present

## 2019-04-11 DIAGNOSIS — E1129 Type 2 diabetes mellitus with other diabetic kidney complication: Secondary | ICD-10-CM | POA: Diagnosis not present

## 2019-04-11 DIAGNOSIS — Z992 Dependence on renal dialysis: Secondary | ICD-10-CM | POA: Diagnosis not present

## 2019-04-11 DIAGNOSIS — N2581 Secondary hyperparathyroidism of renal origin: Secondary | ICD-10-CM | POA: Diagnosis not present

## 2019-04-11 DIAGNOSIS — N186 End stage renal disease: Secondary | ICD-10-CM | POA: Diagnosis not present

## 2019-04-11 DIAGNOSIS — K7689 Other specified diseases of liver: Secondary | ICD-10-CM | POA: Diagnosis not present

## 2019-04-13 DIAGNOSIS — Z992 Dependence on renal dialysis: Secondary | ICD-10-CM | POA: Diagnosis not present

## 2019-04-13 DIAGNOSIS — K7689 Other specified diseases of liver: Secondary | ICD-10-CM | POA: Diagnosis not present

## 2019-04-13 DIAGNOSIS — E1129 Type 2 diabetes mellitus with other diabetic kidney complication: Secondary | ICD-10-CM | POA: Diagnosis not present

## 2019-04-13 DIAGNOSIS — N2581 Secondary hyperparathyroidism of renal origin: Secondary | ICD-10-CM | POA: Diagnosis not present

## 2019-04-13 DIAGNOSIS — N186 End stage renal disease: Secondary | ICD-10-CM | POA: Diagnosis not present

## 2019-04-13 DIAGNOSIS — E119 Type 2 diabetes mellitus without complications: Secondary | ICD-10-CM | POA: Diagnosis not present

## 2019-04-16 DIAGNOSIS — N2581 Secondary hyperparathyroidism of renal origin: Secondary | ICD-10-CM | POA: Diagnosis not present

## 2019-04-16 DIAGNOSIS — E119 Type 2 diabetes mellitus without complications: Secondary | ICD-10-CM | POA: Diagnosis not present

## 2019-04-16 DIAGNOSIS — E1129 Type 2 diabetes mellitus with other diabetic kidney complication: Secondary | ICD-10-CM | POA: Diagnosis not present

## 2019-04-16 DIAGNOSIS — Z992 Dependence on renal dialysis: Secondary | ICD-10-CM | POA: Diagnosis not present

## 2019-04-16 DIAGNOSIS — K7689 Other specified diseases of liver: Secondary | ICD-10-CM | POA: Diagnosis not present

## 2019-04-16 DIAGNOSIS — N186 End stage renal disease: Secondary | ICD-10-CM | POA: Diagnosis not present

## 2019-04-17 DIAGNOSIS — Z992 Dependence on renal dialysis: Secondary | ICD-10-CM | POA: Diagnosis not present

## 2019-04-17 DIAGNOSIS — N2581 Secondary hyperparathyroidism of renal origin: Secondary | ICD-10-CM | POA: Diagnosis not present

## 2019-04-17 DIAGNOSIS — K7689 Other specified diseases of liver: Secondary | ICD-10-CM | POA: Diagnosis not present

## 2019-04-17 DIAGNOSIS — E119 Type 2 diabetes mellitus without complications: Secondary | ICD-10-CM | POA: Diagnosis not present

## 2019-04-17 DIAGNOSIS — E1129 Type 2 diabetes mellitus with other diabetic kidney complication: Secondary | ICD-10-CM | POA: Diagnosis not present

## 2019-04-17 DIAGNOSIS — N186 End stage renal disease: Secondary | ICD-10-CM | POA: Diagnosis not present

## 2019-04-19 DIAGNOSIS — I4891 Unspecified atrial fibrillation: Secondary | ICD-10-CM | POA: Diagnosis not present

## 2019-04-19 DIAGNOSIS — Z992 Dependence on renal dialysis: Secondary | ICD-10-CM | POA: Diagnosis not present

## 2019-04-19 DIAGNOSIS — I959 Hypotension, unspecified: Secondary | ICD-10-CM | POA: Diagnosis not present

## 2019-04-19 DIAGNOSIS — E46 Unspecified protein-calorie malnutrition: Secondary | ICD-10-CM | POA: Diagnosis not present

## 2019-04-19 DIAGNOSIS — E1129 Type 2 diabetes mellitus with other diabetic kidney complication: Secondary | ICD-10-CM | POA: Diagnosis not present

## 2019-04-19 DIAGNOSIS — E119 Type 2 diabetes mellitus without complications: Secondary | ICD-10-CM | POA: Diagnosis not present

## 2019-04-19 DIAGNOSIS — U071 COVID-19: Secondary | ICD-10-CM | POA: Diagnosis not present

## 2019-04-19 DIAGNOSIS — D473 Essential (hemorrhagic) thrombocythemia: Secondary | ICD-10-CM | POA: Diagnosis not present

## 2019-04-19 DIAGNOSIS — R531 Weakness: Secondary | ICD-10-CM | POA: Diagnosis not present

## 2019-04-19 DIAGNOSIS — N2581 Secondary hyperparathyroidism of renal origin: Secondary | ICD-10-CM | POA: Diagnosis not present

## 2019-04-19 DIAGNOSIS — N186 End stage renal disease: Secondary | ICD-10-CM | POA: Diagnosis not present

## 2019-04-19 DIAGNOSIS — E559 Vitamin D deficiency, unspecified: Secondary | ICD-10-CM | POA: Diagnosis not present

## 2019-04-19 DIAGNOSIS — K7689 Other specified diseases of liver: Secondary | ICD-10-CM | POA: Diagnosis not present

## 2019-04-19 DIAGNOSIS — H547 Unspecified visual loss: Secondary | ICD-10-CM | POA: Diagnosis not present

## 2019-04-22 DIAGNOSIS — Z992 Dependence on renal dialysis: Secondary | ICD-10-CM | POA: Diagnosis not present

## 2019-04-22 DIAGNOSIS — K7689 Other specified diseases of liver: Secondary | ICD-10-CM | POA: Diagnosis not present

## 2019-04-22 DIAGNOSIS — E119 Type 2 diabetes mellitus without complications: Secondary | ICD-10-CM | POA: Diagnosis not present

## 2019-04-22 DIAGNOSIS — N186 End stage renal disease: Secondary | ICD-10-CM | POA: Diagnosis not present

## 2019-04-22 DIAGNOSIS — E1129 Type 2 diabetes mellitus with other diabetic kidney complication: Secondary | ICD-10-CM | POA: Diagnosis not present

## 2019-04-22 DIAGNOSIS — N2581 Secondary hyperparathyroidism of renal origin: Secondary | ICD-10-CM | POA: Diagnosis not present

## 2019-04-24 DIAGNOSIS — E1129 Type 2 diabetes mellitus with other diabetic kidney complication: Secondary | ICD-10-CM | POA: Diagnosis not present

## 2019-04-24 DIAGNOSIS — N186 End stage renal disease: Secondary | ICD-10-CM | POA: Diagnosis not present

## 2019-04-24 DIAGNOSIS — Z992 Dependence on renal dialysis: Secondary | ICD-10-CM | POA: Diagnosis not present

## 2019-04-24 DIAGNOSIS — E119 Type 2 diabetes mellitus without complications: Secondary | ICD-10-CM | POA: Diagnosis not present

## 2019-04-24 DIAGNOSIS — N2581 Secondary hyperparathyroidism of renal origin: Secondary | ICD-10-CM | POA: Diagnosis not present

## 2019-04-24 DIAGNOSIS — K7689 Other specified diseases of liver: Secondary | ICD-10-CM | POA: Diagnosis not present

## 2019-04-26 DIAGNOSIS — K7689 Other specified diseases of liver: Secondary | ICD-10-CM | POA: Diagnosis not present

## 2019-04-26 DIAGNOSIS — S51812D Laceration without foreign body of left forearm, subsequent encounter: Secondary | ICD-10-CM | POA: Diagnosis not present

## 2019-04-26 DIAGNOSIS — I480 Paroxysmal atrial fibrillation: Secondary | ICD-10-CM | POA: Diagnosis not present

## 2019-04-26 DIAGNOSIS — E1151 Type 2 diabetes mellitus with diabetic peripheral angiopathy without gangrene: Secondary | ICD-10-CM | POA: Diagnosis not present

## 2019-04-26 DIAGNOSIS — E119 Type 2 diabetes mellitus without complications: Secondary | ICD-10-CM | POA: Diagnosis not present

## 2019-04-26 DIAGNOSIS — D696 Thrombocytopenia, unspecified: Secondary | ICD-10-CM | POA: Diagnosis not present

## 2019-04-26 DIAGNOSIS — C7989 Secondary malignant neoplasm of other specified sites: Secondary | ICD-10-CM | POA: Diagnosis not present

## 2019-04-26 DIAGNOSIS — I120 Hypertensive chronic kidney disease with stage 5 chronic kidney disease or end stage renal disease: Secondary | ICD-10-CM | POA: Diagnosis not present

## 2019-04-26 DIAGNOSIS — N186 End stage renal disease: Secondary | ICD-10-CM | POA: Diagnosis not present

## 2019-04-26 DIAGNOSIS — E1122 Type 2 diabetes mellitus with diabetic chronic kidney disease: Secondary | ICD-10-CM | POA: Diagnosis not present

## 2019-04-26 DIAGNOSIS — N2581 Secondary hyperparathyroidism of renal origin: Secondary | ICD-10-CM | POA: Diagnosis not present

## 2019-04-26 DIAGNOSIS — C786 Secondary malignant neoplasm of retroperitoneum and peritoneum: Secondary | ICD-10-CM | POA: Diagnosis not present

## 2019-04-26 DIAGNOSIS — I2510 Atherosclerotic heart disease of native coronary artery without angina pectoris: Secondary | ICD-10-CM | POA: Diagnosis not present

## 2019-04-26 DIAGNOSIS — D630 Anemia in neoplastic disease: Secondary | ICD-10-CM | POA: Diagnosis not present

## 2019-04-26 DIAGNOSIS — E1129 Type 2 diabetes mellitus with other diabetic kidney complication: Secondary | ICD-10-CM | POA: Diagnosis not present

## 2019-04-26 DIAGNOSIS — U071 COVID-19: Secondary | ICD-10-CM | POA: Diagnosis not present

## 2019-04-26 DIAGNOSIS — Z992 Dependence on renal dialysis: Secondary | ICD-10-CM | POA: Diagnosis not present

## 2019-04-29 DIAGNOSIS — E1122 Type 2 diabetes mellitus with diabetic chronic kidney disease: Secondary | ICD-10-CM | POA: Diagnosis not present

## 2019-04-29 DIAGNOSIS — N186 End stage renal disease: Secondary | ICD-10-CM | POA: Diagnosis not present

## 2019-04-29 DIAGNOSIS — N2581 Secondary hyperparathyroidism of renal origin: Secondary | ICD-10-CM | POA: Diagnosis not present

## 2019-04-29 DIAGNOSIS — E119 Type 2 diabetes mellitus without complications: Secondary | ICD-10-CM | POA: Diagnosis not present

## 2019-04-29 DIAGNOSIS — I1311 Hypertensive heart and chronic kidney disease without heart failure, with stage 5 chronic kidney disease, or end stage renal disease: Secondary | ICD-10-CM | POA: Diagnosis not present

## 2019-04-29 DIAGNOSIS — E782 Mixed hyperlipidemia: Secondary | ICD-10-CM | POA: Diagnosis not present

## 2019-04-29 DIAGNOSIS — Z7901 Long term (current) use of anticoagulants: Secondary | ICD-10-CM | POA: Diagnosis not present

## 2019-04-29 DIAGNOSIS — I4891 Unspecified atrial fibrillation: Secondary | ICD-10-CM | POA: Diagnosis not present

## 2019-04-29 DIAGNOSIS — E1129 Type 2 diabetes mellitus with other diabetic kidney complication: Secondary | ICD-10-CM | POA: Diagnosis not present

## 2019-04-29 DIAGNOSIS — C649 Malignant neoplasm of unspecified kidney, except renal pelvis: Secondary | ICD-10-CM | POA: Diagnosis not present

## 2019-04-29 DIAGNOSIS — Z992 Dependence on renal dialysis: Secondary | ICD-10-CM | POA: Diagnosis not present

## 2019-04-29 DIAGNOSIS — K7689 Other specified diseases of liver: Secondary | ICD-10-CM | POA: Diagnosis not present

## 2019-05-01 DIAGNOSIS — Z992 Dependence on renal dialysis: Secondary | ICD-10-CM | POA: Diagnosis not present

## 2019-05-01 DIAGNOSIS — E1129 Type 2 diabetes mellitus with other diabetic kidney complication: Secondary | ICD-10-CM | POA: Diagnosis not present

## 2019-05-01 DIAGNOSIS — K7689 Other specified diseases of liver: Secondary | ICD-10-CM | POA: Diagnosis not present

## 2019-05-01 DIAGNOSIS — E119 Type 2 diabetes mellitus without complications: Secondary | ICD-10-CM | POA: Diagnosis not present

## 2019-05-01 DIAGNOSIS — N186 End stage renal disease: Secondary | ICD-10-CM | POA: Diagnosis not present

## 2019-05-01 DIAGNOSIS — N2581 Secondary hyperparathyroidism of renal origin: Secondary | ICD-10-CM | POA: Diagnosis not present

## 2019-05-03 DIAGNOSIS — N186 End stage renal disease: Secondary | ICD-10-CM | POA: Diagnosis not present

## 2019-05-03 DIAGNOSIS — E119 Type 2 diabetes mellitus without complications: Secondary | ICD-10-CM | POA: Diagnosis not present

## 2019-05-03 DIAGNOSIS — N2581 Secondary hyperparathyroidism of renal origin: Secondary | ICD-10-CM | POA: Diagnosis not present

## 2019-05-03 DIAGNOSIS — Z992 Dependence on renal dialysis: Secondary | ICD-10-CM | POA: Diagnosis not present

## 2019-05-03 DIAGNOSIS — E1129 Type 2 diabetes mellitus with other diabetic kidney complication: Secondary | ICD-10-CM | POA: Diagnosis not present

## 2019-05-03 DIAGNOSIS — K7689 Other specified diseases of liver: Secondary | ICD-10-CM | POA: Diagnosis not present

## 2019-05-06 DIAGNOSIS — N2581 Secondary hyperparathyroidism of renal origin: Secondary | ICD-10-CM | POA: Diagnosis not present

## 2019-05-06 DIAGNOSIS — Z992 Dependence on renal dialysis: Secondary | ICD-10-CM | POA: Diagnosis not present

## 2019-05-06 DIAGNOSIS — E1129 Type 2 diabetes mellitus with other diabetic kidney complication: Secondary | ICD-10-CM | POA: Diagnosis not present

## 2019-05-06 DIAGNOSIS — N186 End stage renal disease: Secondary | ICD-10-CM | POA: Diagnosis not present

## 2019-05-06 DIAGNOSIS — K7689 Other specified diseases of liver: Secondary | ICD-10-CM | POA: Diagnosis not present

## 2019-05-06 DIAGNOSIS — D509 Iron deficiency anemia, unspecified: Secondary | ICD-10-CM | POA: Diagnosis not present

## 2019-05-06 DIAGNOSIS — D631 Anemia in chronic kidney disease: Secondary | ICD-10-CM | POA: Diagnosis not present

## 2019-05-06 DIAGNOSIS — E119 Type 2 diabetes mellitus without complications: Secondary | ICD-10-CM | POA: Diagnosis not present

## 2019-05-08 DIAGNOSIS — E1129 Type 2 diabetes mellitus with other diabetic kidney complication: Secondary | ICD-10-CM | POA: Diagnosis not present

## 2019-05-08 DIAGNOSIS — Z992 Dependence on renal dialysis: Secondary | ICD-10-CM | POA: Diagnosis not present

## 2019-05-08 DIAGNOSIS — D509 Iron deficiency anemia, unspecified: Secondary | ICD-10-CM | POA: Diagnosis not present

## 2019-05-08 DIAGNOSIS — E119 Type 2 diabetes mellitus without complications: Secondary | ICD-10-CM | POA: Diagnosis not present

## 2019-05-08 DIAGNOSIS — N186 End stage renal disease: Secondary | ICD-10-CM | POA: Diagnosis not present

## 2019-05-08 DIAGNOSIS — N2581 Secondary hyperparathyroidism of renal origin: Secondary | ICD-10-CM | POA: Diagnosis not present

## 2019-05-10 DIAGNOSIS — D509 Iron deficiency anemia, unspecified: Secondary | ICD-10-CM | POA: Diagnosis not present

## 2019-05-10 DIAGNOSIS — N186 End stage renal disease: Secondary | ICD-10-CM | POA: Diagnosis not present

## 2019-05-10 DIAGNOSIS — E119 Type 2 diabetes mellitus without complications: Secondary | ICD-10-CM | POA: Diagnosis not present

## 2019-05-10 DIAGNOSIS — N2581 Secondary hyperparathyroidism of renal origin: Secondary | ICD-10-CM | POA: Diagnosis not present

## 2019-05-10 DIAGNOSIS — E1129 Type 2 diabetes mellitus with other diabetic kidney complication: Secondary | ICD-10-CM | POA: Diagnosis not present

## 2019-05-10 DIAGNOSIS — Z992 Dependence on renal dialysis: Secondary | ICD-10-CM | POA: Diagnosis not present

## 2019-05-13 DIAGNOSIS — N2581 Secondary hyperparathyroidism of renal origin: Secondary | ICD-10-CM | POA: Diagnosis not present

## 2019-05-13 DIAGNOSIS — E1129 Type 2 diabetes mellitus with other diabetic kidney complication: Secondary | ICD-10-CM | POA: Diagnosis not present

## 2019-05-13 DIAGNOSIS — D509 Iron deficiency anemia, unspecified: Secondary | ICD-10-CM | POA: Diagnosis not present

## 2019-05-13 DIAGNOSIS — N186 End stage renal disease: Secondary | ICD-10-CM | POA: Diagnosis not present

## 2019-05-13 DIAGNOSIS — E119 Type 2 diabetes mellitus without complications: Secondary | ICD-10-CM | POA: Diagnosis not present

## 2019-05-13 DIAGNOSIS — Z992 Dependence on renal dialysis: Secondary | ICD-10-CM | POA: Diagnosis not present

## 2019-05-15 DIAGNOSIS — E1129 Type 2 diabetes mellitus with other diabetic kidney complication: Secondary | ICD-10-CM | POA: Diagnosis not present

## 2019-05-15 DIAGNOSIS — Z992 Dependence on renal dialysis: Secondary | ICD-10-CM | POA: Diagnosis not present

## 2019-05-15 DIAGNOSIS — E119 Type 2 diabetes mellitus without complications: Secondary | ICD-10-CM | POA: Diagnosis not present

## 2019-05-15 DIAGNOSIS — N2581 Secondary hyperparathyroidism of renal origin: Secondary | ICD-10-CM | POA: Diagnosis not present

## 2019-05-15 DIAGNOSIS — D509 Iron deficiency anemia, unspecified: Secondary | ICD-10-CM | POA: Diagnosis not present

## 2019-05-15 DIAGNOSIS — N186 End stage renal disease: Secondary | ICD-10-CM | POA: Diagnosis not present

## 2019-05-17 DIAGNOSIS — D509 Iron deficiency anemia, unspecified: Secondary | ICD-10-CM | POA: Diagnosis not present

## 2019-05-17 DIAGNOSIS — E1129 Type 2 diabetes mellitus with other diabetic kidney complication: Secondary | ICD-10-CM | POA: Diagnosis not present

## 2019-05-17 DIAGNOSIS — N2581 Secondary hyperparathyroidism of renal origin: Secondary | ICD-10-CM | POA: Diagnosis not present

## 2019-05-17 DIAGNOSIS — Z992 Dependence on renal dialysis: Secondary | ICD-10-CM | POA: Diagnosis not present

## 2019-05-17 DIAGNOSIS — E119 Type 2 diabetes mellitus without complications: Secondary | ICD-10-CM | POA: Diagnosis not present

## 2019-05-17 DIAGNOSIS — N186 End stage renal disease: Secondary | ICD-10-CM | POA: Diagnosis not present

## 2019-05-20 DIAGNOSIS — E119 Type 2 diabetes mellitus without complications: Secondary | ICD-10-CM | POA: Diagnosis not present

## 2019-05-20 DIAGNOSIS — N2581 Secondary hyperparathyroidism of renal origin: Secondary | ICD-10-CM | POA: Diagnosis not present

## 2019-05-20 DIAGNOSIS — Z992 Dependence on renal dialysis: Secondary | ICD-10-CM | POA: Diagnosis not present

## 2019-05-20 DIAGNOSIS — E1129 Type 2 diabetes mellitus with other diabetic kidney complication: Secondary | ICD-10-CM | POA: Diagnosis not present

## 2019-05-20 DIAGNOSIS — N186 End stage renal disease: Secondary | ICD-10-CM | POA: Diagnosis not present

## 2019-05-20 DIAGNOSIS — D509 Iron deficiency anemia, unspecified: Secondary | ICD-10-CM | POA: Diagnosis not present

## 2019-05-22 DIAGNOSIS — Z992 Dependence on renal dialysis: Secondary | ICD-10-CM | POA: Diagnosis not present

## 2019-05-22 DIAGNOSIS — E1129 Type 2 diabetes mellitus with other diabetic kidney complication: Secondary | ICD-10-CM | POA: Diagnosis not present

## 2019-05-22 DIAGNOSIS — E119 Type 2 diabetes mellitus without complications: Secondary | ICD-10-CM | POA: Diagnosis not present

## 2019-05-22 DIAGNOSIS — N2581 Secondary hyperparathyroidism of renal origin: Secondary | ICD-10-CM | POA: Diagnosis not present

## 2019-05-22 DIAGNOSIS — D509 Iron deficiency anemia, unspecified: Secondary | ICD-10-CM | POA: Diagnosis not present

## 2019-05-22 DIAGNOSIS — N186 End stage renal disease: Secondary | ICD-10-CM | POA: Diagnosis not present

## 2019-05-24 DIAGNOSIS — N2581 Secondary hyperparathyroidism of renal origin: Secondary | ICD-10-CM | POA: Diagnosis not present

## 2019-05-24 DIAGNOSIS — E1129 Type 2 diabetes mellitus with other diabetic kidney complication: Secondary | ICD-10-CM | POA: Diagnosis not present

## 2019-05-24 DIAGNOSIS — N186 End stage renal disease: Secondary | ICD-10-CM | POA: Diagnosis not present

## 2019-05-24 DIAGNOSIS — Z992 Dependence on renal dialysis: Secondary | ICD-10-CM | POA: Diagnosis not present

## 2019-05-24 DIAGNOSIS — E119 Type 2 diabetes mellitus without complications: Secondary | ICD-10-CM | POA: Diagnosis not present

## 2019-05-24 DIAGNOSIS — D509 Iron deficiency anemia, unspecified: Secondary | ICD-10-CM | POA: Diagnosis not present

## 2019-05-25 DIAGNOSIS — F411 Generalized anxiety disorder: Secondary | ICD-10-CM | POA: Diagnosis not present

## 2019-05-25 DIAGNOSIS — F331 Major depressive disorder, recurrent, moderate: Secondary | ICD-10-CM | POA: Diagnosis not present

## 2019-05-26 DIAGNOSIS — U071 COVID-19: Secondary | ICD-10-CM | POA: Diagnosis not present

## 2019-05-27 DIAGNOSIS — N2581 Secondary hyperparathyroidism of renal origin: Secondary | ICD-10-CM | POA: Diagnosis not present

## 2019-05-27 DIAGNOSIS — E1129 Type 2 diabetes mellitus with other diabetic kidney complication: Secondary | ICD-10-CM | POA: Diagnosis not present

## 2019-05-27 DIAGNOSIS — D509 Iron deficiency anemia, unspecified: Secondary | ICD-10-CM | POA: Diagnosis not present

## 2019-05-27 DIAGNOSIS — E119 Type 2 diabetes mellitus without complications: Secondary | ICD-10-CM | POA: Diagnosis not present

## 2019-05-27 DIAGNOSIS — N186 End stage renal disease: Secondary | ICD-10-CM | POA: Diagnosis not present

## 2019-05-27 DIAGNOSIS — Z992 Dependence on renal dialysis: Secondary | ICD-10-CM | POA: Diagnosis not present

## 2019-05-29 DIAGNOSIS — Z992 Dependence on renal dialysis: Secondary | ICD-10-CM | POA: Diagnosis not present

## 2019-05-29 DIAGNOSIS — N2581 Secondary hyperparathyroidism of renal origin: Secondary | ICD-10-CM | POA: Diagnosis not present

## 2019-05-29 DIAGNOSIS — N186 End stage renal disease: Secondary | ICD-10-CM | POA: Diagnosis not present

## 2019-05-29 DIAGNOSIS — E119 Type 2 diabetes mellitus without complications: Secondary | ICD-10-CM | POA: Diagnosis not present

## 2019-05-29 DIAGNOSIS — D509 Iron deficiency anemia, unspecified: Secondary | ICD-10-CM | POA: Diagnosis not present

## 2019-05-29 DIAGNOSIS — E1129 Type 2 diabetes mellitus with other diabetic kidney complication: Secondary | ICD-10-CM | POA: Diagnosis not present

## 2019-05-31 DIAGNOSIS — D509 Iron deficiency anemia, unspecified: Secondary | ICD-10-CM | POA: Diagnosis not present

## 2019-05-31 DIAGNOSIS — Z992 Dependence on renal dialysis: Secondary | ICD-10-CM | POA: Diagnosis not present

## 2019-05-31 DIAGNOSIS — N186 End stage renal disease: Secondary | ICD-10-CM | POA: Diagnosis not present

## 2019-05-31 DIAGNOSIS — E119 Type 2 diabetes mellitus without complications: Secondary | ICD-10-CM | POA: Diagnosis not present

## 2019-05-31 DIAGNOSIS — E1129 Type 2 diabetes mellitus with other diabetic kidney complication: Secondary | ICD-10-CM | POA: Diagnosis not present

## 2019-05-31 DIAGNOSIS — N2581 Secondary hyperparathyroidism of renal origin: Secondary | ICD-10-CM | POA: Diagnosis not present

## 2019-06-01 DIAGNOSIS — F331 Major depressive disorder, recurrent, moderate: Secondary | ICD-10-CM | POA: Diagnosis not present

## 2019-06-03 DIAGNOSIS — D631 Anemia in chronic kidney disease: Secondary | ICD-10-CM | POA: Diagnosis not present

## 2019-06-03 DIAGNOSIS — E1129 Type 2 diabetes mellitus with other diabetic kidney complication: Secondary | ICD-10-CM | POA: Diagnosis not present

## 2019-06-03 DIAGNOSIS — N186 End stage renal disease: Secondary | ICD-10-CM | POA: Diagnosis not present

## 2019-06-03 DIAGNOSIS — Z992 Dependence on renal dialysis: Secondary | ICD-10-CM | POA: Diagnosis not present

## 2019-06-03 DIAGNOSIS — K7689 Other specified diseases of liver: Secondary | ICD-10-CM | POA: Diagnosis not present

## 2019-06-03 DIAGNOSIS — E119 Type 2 diabetes mellitus without complications: Secondary | ICD-10-CM | POA: Diagnosis not present

## 2019-06-03 DIAGNOSIS — N2581 Secondary hyperparathyroidism of renal origin: Secondary | ICD-10-CM | POA: Diagnosis not present

## 2019-06-05 DIAGNOSIS — Z992 Dependence on renal dialysis: Secondary | ICD-10-CM | POA: Diagnosis not present

## 2019-06-05 DIAGNOSIS — E119 Type 2 diabetes mellitus without complications: Secondary | ICD-10-CM | POA: Diagnosis not present

## 2019-06-05 DIAGNOSIS — K7689 Other specified diseases of liver: Secondary | ICD-10-CM | POA: Diagnosis not present

## 2019-06-05 DIAGNOSIS — N2581 Secondary hyperparathyroidism of renal origin: Secondary | ICD-10-CM | POA: Diagnosis not present

## 2019-06-05 DIAGNOSIS — N186 End stage renal disease: Secondary | ICD-10-CM | POA: Diagnosis not present

## 2019-06-05 DIAGNOSIS — D631 Anemia in chronic kidney disease: Secondary | ICD-10-CM | POA: Diagnosis not present

## 2019-06-07 DIAGNOSIS — D631 Anemia in chronic kidney disease: Secondary | ICD-10-CM | POA: Diagnosis not present

## 2019-06-07 DIAGNOSIS — N2581 Secondary hyperparathyroidism of renal origin: Secondary | ICD-10-CM | POA: Diagnosis not present

## 2019-06-07 DIAGNOSIS — K7689 Other specified diseases of liver: Secondary | ICD-10-CM | POA: Diagnosis not present

## 2019-06-07 DIAGNOSIS — Z992 Dependence on renal dialysis: Secondary | ICD-10-CM | POA: Diagnosis not present

## 2019-06-07 DIAGNOSIS — E119 Type 2 diabetes mellitus without complications: Secondary | ICD-10-CM | POA: Diagnosis not present

## 2019-06-07 DIAGNOSIS — N186 End stage renal disease: Secondary | ICD-10-CM | POA: Diagnosis not present

## 2019-06-10 DIAGNOSIS — Z992 Dependence on renal dialysis: Secondary | ICD-10-CM | POA: Diagnosis not present

## 2019-06-10 DIAGNOSIS — K7689 Other specified diseases of liver: Secondary | ICD-10-CM | POA: Diagnosis not present

## 2019-06-10 DIAGNOSIS — N2581 Secondary hyperparathyroidism of renal origin: Secondary | ICD-10-CM | POA: Diagnosis not present

## 2019-06-10 DIAGNOSIS — N186 End stage renal disease: Secondary | ICD-10-CM | POA: Diagnosis not present

## 2019-06-10 DIAGNOSIS — D631 Anemia in chronic kidney disease: Secondary | ICD-10-CM | POA: Diagnosis not present

## 2019-06-10 DIAGNOSIS — E119 Type 2 diabetes mellitus without complications: Secondary | ICD-10-CM | POA: Diagnosis not present

## 2019-06-12 DIAGNOSIS — D631 Anemia in chronic kidney disease: Secondary | ICD-10-CM | POA: Diagnosis not present

## 2019-06-12 DIAGNOSIS — Z992 Dependence on renal dialysis: Secondary | ICD-10-CM | POA: Diagnosis not present

## 2019-06-12 DIAGNOSIS — E119 Type 2 diabetes mellitus without complications: Secondary | ICD-10-CM | POA: Diagnosis not present

## 2019-06-12 DIAGNOSIS — N186 End stage renal disease: Secondary | ICD-10-CM | POA: Diagnosis not present

## 2019-06-12 DIAGNOSIS — N2581 Secondary hyperparathyroidism of renal origin: Secondary | ICD-10-CM | POA: Diagnosis not present

## 2019-06-12 DIAGNOSIS — K7689 Other specified diseases of liver: Secondary | ICD-10-CM | POA: Diagnosis not present

## 2019-06-14 DIAGNOSIS — K7689 Other specified diseases of liver: Secondary | ICD-10-CM | POA: Diagnosis not present

## 2019-06-14 DIAGNOSIS — E119 Type 2 diabetes mellitus without complications: Secondary | ICD-10-CM | POA: Diagnosis not present

## 2019-06-14 DIAGNOSIS — Z992 Dependence on renal dialysis: Secondary | ICD-10-CM | POA: Diagnosis not present

## 2019-06-14 DIAGNOSIS — N2581 Secondary hyperparathyroidism of renal origin: Secondary | ICD-10-CM | POA: Diagnosis not present

## 2019-06-14 DIAGNOSIS — N186 End stage renal disease: Secondary | ICD-10-CM | POA: Diagnosis not present

## 2019-06-14 DIAGNOSIS — D631 Anemia in chronic kidney disease: Secondary | ICD-10-CM | POA: Diagnosis not present

## 2019-06-15 DIAGNOSIS — F331 Major depressive disorder, recurrent, moderate: Secondary | ICD-10-CM | POA: Diagnosis not present

## 2019-06-17 DIAGNOSIS — K7689 Other specified diseases of liver: Secondary | ICD-10-CM | POA: Diagnosis not present

## 2019-06-17 DIAGNOSIS — N2581 Secondary hyperparathyroidism of renal origin: Secondary | ICD-10-CM | POA: Diagnosis not present

## 2019-06-17 DIAGNOSIS — N186 End stage renal disease: Secondary | ICD-10-CM | POA: Diagnosis not present

## 2019-06-17 DIAGNOSIS — D631 Anemia in chronic kidney disease: Secondary | ICD-10-CM | POA: Diagnosis not present

## 2019-06-17 DIAGNOSIS — Z992 Dependence on renal dialysis: Secondary | ICD-10-CM | POA: Diagnosis not present

## 2019-06-17 DIAGNOSIS — E119 Type 2 diabetes mellitus without complications: Secondary | ICD-10-CM | POA: Diagnosis not present

## 2019-06-19 DIAGNOSIS — E1122 Type 2 diabetes mellitus with diabetic chronic kidney disease: Secondary | ICD-10-CM | POA: Diagnosis not present

## 2019-06-19 DIAGNOSIS — K7689 Other specified diseases of liver: Secondary | ICD-10-CM | POA: Diagnosis not present

## 2019-06-19 DIAGNOSIS — N2581 Secondary hyperparathyroidism of renal origin: Secondary | ICD-10-CM | POA: Diagnosis not present

## 2019-06-19 DIAGNOSIS — N186 End stage renal disease: Secondary | ICD-10-CM | POA: Diagnosis not present

## 2019-06-19 DIAGNOSIS — B351 Tinea unguium: Secondary | ICD-10-CM | POA: Diagnosis not present

## 2019-06-19 DIAGNOSIS — E119 Type 2 diabetes mellitus without complications: Secondary | ICD-10-CM | POA: Diagnosis not present

## 2019-06-19 DIAGNOSIS — S98132A Complete traumatic amputation of one left lesser toe, initial encounter: Secondary | ICD-10-CM | POA: Diagnosis not present

## 2019-06-19 DIAGNOSIS — L97429 Non-pressure chronic ulcer of left heel and midfoot with unspecified severity: Secondary | ICD-10-CM | POA: Diagnosis not present

## 2019-06-19 DIAGNOSIS — D631 Anemia in chronic kidney disease: Secondary | ICD-10-CM | POA: Diagnosis not present

## 2019-06-19 DIAGNOSIS — Z992 Dependence on renal dialysis: Secondary | ICD-10-CM | POA: Diagnosis not present

## 2019-06-21 DIAGNOSIS — Z992 Dependence on renal dialysis: Secondary | ICD-10-CM | POA: Diagnosis not present

## 2019-06-21 DIAGNOSIS — K7689 Other specified diseases of liver: Secondary | ICD-10-CM | POA: Diagnosis not present

## 2019-06-21 DIAGNOSIS — E119 Type 2 diabetes mellitus without complications: Secondary | ICD-10-CM | POA: Diagnosis not present

## 2019-06-21 DIAGNOSIS — N2581 Secondary hyperparathyroidism of renal origin: Secondary | ICD-10-CM | POA: Diagnosis not present

## 2019-06-21 DIAGNOSIS — D631 Anemia in chronic kidney disease: Secondary | ICD-10-CM | POA: Diagnosis not present

## 2019-06-21 DIAGNOSIS — N186 End stage renal disease: Secondary | ICD-10-CM | POA: Diagnosis not present

## 2019-06-22 DIAGNOSIS — F331 Major depressive disorder, recurrent, moderate: Secondary | ICD-10-CM | POA: Diagnosis not present

## 2019-06-24 DIAGNOSIS — K7689 Other specified diseases of liver: Secondary | ICD-10-CM | POA: Diagnosis not present

## 2019-06-24 DIAGNOSIS — E119 Type 2 diabetes mellitus without complications: Secondary | ICD-10-CM | POA: Diagnosis not present

## 2019-06-24 DIAGNOSIS — N186 End stage renal disease: Secondary | ICD-10-CM | POA: Diagnosis not present

## 2019-06-24 DIAGNOSIS — N2581 Secondary hyperparathyroidism of renal origin: Secondary | ICD-10-CM | POA: Diagnosis not present

## 2019-06-24 DIAGNOSIS — D631 Anemia in chronic kidney disease: Secondary | ICD-10-CM | POA: Diagnosis not present

## 2019-06-24 DIAGNOSIS — Z992 Dependence on renal dialysis: Secondary | ICD-10-CM | POA: Diagnosis not present

## 2019-06-25 DIAGNOSIS — S51812D Laceration without foreign body of left forearm, subsequent encounter: Secondary | ICD-10-CM | POA: Diagnosis not present

## 2019-06-26 DIAGNOSIS — N186 End stage renal disease: Secondary | ICD-10-CM | POA: Diagnosis not present

## 2019-06-26 DIAGNOSIS — D631 Anemia in chronic kidney disease: Secondary | ICD-10-CM | POA: Diagnosis not present

## 2019-06-26 DIAGNOSIS — Z992 Dependence on renal dialysis: Secondary | ICD-10-CM | POA: Diagnosis not present

## 2019-06-26 DIAGNOSIS — K7689 Other specified diseases of liver: Secondary | ICD-10-CM | POA: Diagnosis not present

## 2019-06-26 DIAGNOSIS — N2581 Secondary hyperparathyroidism of renal origin: Secondary | ICD-10-CM | POA: Diagnosis not present

## 2019-06-26 DIAGNOSIS — E119 Type 2 diabetes mellitus without complications: Secondary | ICD-10-CM | POA: Diagnosis not present

## 2019-06-28 DIAGNOSIS — D631 Anemia in chronic kidney disease: Secondary | ICD-10-CM | POA: Diagnosis not present

## 2019-06-28 DIAGNOSIS — E119 Type 2 diabetes mellitus without complications: Secondary | ICD-10-CM | POA: Diagnosis not present

## 2019-06-28 DIAGNOSIS — Z992 Dependence on renal dialysis: Secondary | ICD-10-CM | POA: Diagnosis not present

## 2019-06-28 DIAGNOSIS — N2581 Secondary hyperparathyroidism of renal origin: Secondary | ICD-10-CM | POA: Diagnosis not present

## 2019-06-28 DIAGNOSIS — N186 End stage renal disease: Secondary | ICD-10-CM | POA: Diagnosis not present

## 2019-06-28 DIAGNOSIS — K7689 Other specified diseases of liver: Secondary | ICD-10-CM | POA: Diagnosis not present

## 2019-06-29 DIAGNOSIS — F331 Major depressive disorder, recurrent, moderate: Secondary | ICD-10-CM | POA: Diagnosis not present

## 2019-07-01 DIAGNOSIS — E119 Type 2 diabetes mellitus without complications: Secondary | ICD-10-CM | POA: Diagnosis not present

## 2019-07-01 DIAGNOSIS — D631 Anemia in chronic kidney disease: Secondary | ICD-10-CM | POA: Diagnosis not present

## 2019-07-01 DIAGNOSIS — N186 End stage renal disease: Secondary | ICD-10-CM | POA: Diagnosis not present

## 2019-07-01 DIAGNOSIS — Z992 Dependence on renal dialysis: Secondary | ICD-10-CM | POA: Diagnosis not present

## 2019-07-01 DIAGNOSIS — N2581 Secondary hyperparathyroidism of renal origin: Secondary | ICD-10-CM | POA: Diagnosis not present

## 2019-07-01 DIAGNOSIS — K7689 Other specified diseases of liver: Secondary | ICD-10-CM | POA: Diagnosis not present

## 2019-07-03 DIAGNOSIS — D631 Anemia in chronic kidney disease: Secondary | ICD-10-CM | POA: Diagnosis not present

## 2019-07-03 DIAGNOSIS — N2581 Secondary hyperparathyroidism of renal origin: Secondary | ICD-10-CM | POA: Diagnosis not present

## 2019-07-03 DIAGNOSIS — K7689 Other specified diseases of liver: Secondary | ICD-10-CM | POA: Diagnosis not present

## 2019-07-03 DIAGNOSIS — E119 Type 2 diabetes mellitus without complications: Secondary | ICD-10-CM | POA: Diagnosis not present

## 2019-07-03 DIAGNOSIS — Z992 Dependence on renal dialysis: Secondary | ICD-10-CM | POA: Diagnosis not present

## 2019-07-03 DIAGNOSIS — N186 End stage renal disease: Secondary | ICD-10-CM | POA: Diagnosis not present

## 2019-07-04 DIAGNOSIS — C7951 Secondary malignant neoplasm of bone: Secondary | ICD-10-CM | POA: Diagnosis not present

## 2019-07-04 DIAGNOSIS — C641 Malignant neoplasm of right kidney, except renal pelvis: Secondary | ICD-10-CM | POA: Diagnosis not present

## 2019-07-13 DIAGNOSIS — F331 Major depressive disorder, recurrent, moderate: Secondary | ICD-10-CM | POA: Diagnosis not present

## 2019-07-25 DIAGNOSIS — S51812D Laceration without foreign body of left forearm, subsequent encounter: Secondary | ICD-10-CM | POA: Diagnosis not present

## 2019-12-18 ENCOUNTER — Encounter (HOSPITAL_COMMUNITY): Payer: Self-pay

## 2019-12-18 ENCOUNTER — Inpatient Hospital Stay (HOSPITAL_COMMUNITY)
Admission: EM | Admit: 2019-12-18 | Discharge: 2020-01-03 | DRG: 686 | Disposition: E | Payer: Medicare Other | Attending: Internal Medicine | Admitting: Internal Medicine

## 2019-12-18 ENCOUNTER — Other Ambulatory Visit: Payer: Self-pay

## 2019-12-18 ENCOUNTER — Emergency Department (HOSPITAL_COMMUNITY): Payer: Medicare Other

## 2019-12-18 DIAGNOSIS — Z8546 Personal history of malignant neoplasm of prostate: Secondary | ICD-10-CM

## 2019-12-18 DIAGNOSIS — E1151 Type 2 diabetes mellitus with diabetic peripheral angiopathy without gangrene: Secondary | ICD-10-CM | POA: Diagnosis present

## 2019-12-18 DIAGNOSIS — J9601 Acute respiratory failure with hypoxia: Secondary | ICD-10-CM

## 2019-12-18 DIAGNOSIS — H269 Unspecified cataract: Secondary | ICD-10-CM | POA: Diagnosis present

## 2019-12-18 DIAGNOSIS — R9389 Abnormal findings on diagnostic imaging of other specified body structures: Secondary | ICD-10-CM | POA: Diagnosis present

## 2019-12-18 DIAGNOSIS — Z905 Acquired absence of kidney: Secondary | ICD-10-CM

## 2019-12-18 DIAGNOSIS — Z91048 Other nonmedicinal substance allergy status: Secondary | ICD-10-CM

## 2019-12-18 DIAGNOSIS — M8458XA Pathological fracture in neoplastic disease, other specified site, initial encounter for fracture: Secondary | ICD-10-CM | POA: Diagnosis present

## 2019-12-18 DIAGNOSIS — Z515 Encounter for palliative care: Secondary | ICD-10-CM | POA: Diagnosis not present

## 2019-12-18 DIAGNOSIS — D696 Thrombocytopenia, unspecified: Secondary | ICD-10-CM | POA: Diagnosis present

## 2019-12-18 DIAGNOSIS — Z970 Presence of artificial eye: Secondary | ICD-10-CM

## 2019-12-18 DIAGNOSIS — Z7901 Long term (current) use of anticoagulants: Secondary | ICD-10-CM

## 2019-12-18 DIAGNOSIS — Z992 Dependence on renal dialysis: Secondary | ICD-10-CM

## 2019-12-18 DIAGNOSIS — Z20822 Contact with and (suspected) exposure to covid-19: Secondary | ICD-10-CM | POA: Diagnosis present

## 2019-12-18 DIAGNOSIS — E119 Type 2 diabetes mellitus without complications: Secondary | ICD-10-CM

## 2019-12-18 DIAGNOSIS — N186 End stage renal disease: Secondary | ICD-10-CM | POA: Diagnosis present

## 2019-12-18 DIAGNOSIS — C771 Secondary and unspecified malignant neoplasm of intrathoracic lymph nodes: Secondary | ICD-10-CM | POA: Diagnosis present

## 2019-12-18 DIAGNOSIS — Z803 Family history of malignant neoplasm of breast: Secondary | ICD-10-CM

## 2019-12-18 DIAGNOSIS — Z79899 Other long term (current) drug therapy: Secondary | ICD-10-CM

## 2019-12-18 DIAGNOSIS — D630 Anemia in neoplastic disease: Secondary | ICD-10-CM | POA: Diagnosis present

## 2019-12-18 DIAGNOSIS — E8889 Other specified metabolic disorders: Secondary | ICD-10-CM | POA: Diagnosis present

## 2019-12-18 DIAGNOSIS — I10 Essential (primary) hypertension: Secondary | ICD-10-CM | POA: Diagnosis present

## 2019-12-18 DIAGNOSIS — N200 Calculus of kidney: Secondary | ICD-10-CM | POA: Diagnosis present

## 2019-12-18 DIAGNOSIS — R1909 Other intra-abdominal and pelvic swelling, mass and lump: Secondary | ICD-10-CM | POA: Diagnosis present

## 2019-12-18 DIAGNOSIS — Z8616 Personal history of COVID-19: Secondary | ICD-10-CM

## 2019-12-18 DIAGNOSIS — C7951 Secondary malignant neoplasm of bone: Secondary | ICD-10-CM | POA: Diagnosis present

## 2019-12-18 DIAGNOSIS — E876 Hypokalemia: Secondary | ICD-10-CM | POA: Diagnosis present

## 2019-12-18 DIAGNOSIS — Z7984 Long term (current) use of oral hypoglycemic drugs: Secondary | ICD-10-CM

## 2019-12-18 DIAGNOSIS — J90 Pleural effusion, not elsewhere classified: Secondary | ICD-10-CM | POA: Diagnosis not present

## 2019-12-18 DIAGNOSIS — R54 Age-related physical debility: Secondary | ICD-10-CM | POA: Diagnosis present

## 2019-12-18 DIAGNOSIS — E782 Mixed hyperlipidemia: Secondary | ICD-10-CM | POA: Diagnosis present

## 2019-12-18 DIAGNOSIS — G9341 Metabolic encephalopathy: Secondary | ICD-10-CM | POA: Diagnosis present

## 2019-12-18 DIAGNOSIS — Z833 Family history of diabetes mellitus: Secondary | ICD-10-CM

## 2019-12-18 DIAGNOSIS — I2510 Atherosclerotic heart disease of native coronary artery without angina pectoris: Secondary | ICD-10-CM | POA: Diagnosis present

## 2019-12-18 DIAGNOSIS — I482 Chronic atrial fibrillation, unspecified: Secondary | ICD-10-CM | POA: Diagnosis present

## 2019-12-18 DIAGNOSIS — J910 Malignant pleural effusion: Secondary | ICD-10-CM | POA: Diagnosis present

## 2019-12-18 DIAGNOSIS — Z825 Family history of asthma and other chronic lower respiratory diseases: Secondary | ICD-10-CM

## 2019-12-18 DIAGNOSIS — Z66 Do not resuscitate: Secondary | ICD-10-CM | POA: Diagnosis present

## 2019-12-18 DIAGNOSIS — J9811 Atelectasis: Secondary | ICD-10-CM | POA: Diagnosis present

## 2019-12-18 DIAGNOSIS — Z86010 Personal history of colonic polyps: Secondary | ICD-10-CM

## 2019-12-18 DIAGNOSIS — Z89431 Acquired absence of right foot: Secondary | ICD-10-CM

## 2019-12-18 DIAGNOSIS — Z9001 Acquired absence of eye: Secondary | ICD-10-CM

## 2019-12-18 DIAGNOSIS — Z9049 Acquired absence of other specified parts of digestive tract: Secondary | ICD-10-CM

## 2019-12-18 DIAGNOSIS — I272 Pulmonary hypertension, unspecified: Secondary | ICD-10-CM | POA: Diagnosis present

## 2019-12-18 DIAGNOSIS — E1140 Type 2 diabetes mellitus with diabetic neuropathy, unspecified: Secondary | ICD-10-CM | POA: Diagnosis present

## 2019-12-18 DIAGNOSIS — R778 Other specified abnormalities of plasma proteins: Secondary | ICD-10-CM | POA: Diagnosis present

## 2019-12-18 DIAGNOSIS — H543 Unqualified visual loss, both eyes: Secondary | ICD-10-CM | POA: Diagnosis present

## 2019-12-18 DIAGNOSIS — Z885 Allergy status to narcotic agent status: Secondary | ICD-10-CM

## 2019-12-18 DIAGNOSIS — Z9689 Presence of other specified functional implants: Secondary | ICD-10-CM

## 2019-12-18 DIAGNOSIS — E1136 Type 2 diabetes mellitus with diabetic cataract: Secondary | ICD-10-CM | POA: Diagnosis present

## 2019-12-18 DIAGNOSIS — Z83438 Family history of other disorder of lipoprotein metabolism and other lipidemia: Secondary | ICD-10-CM

## 2019-12-18 DIAGNOSIS — N2581 Secondary hyperparathyroidism of renal origin: Secondary | ICD-10-CM | POA: Diagnosis present

## 2019-12-18 DIAGNOSIS — Z801 Family history of malignant neoplasm of trachea, bronchus and lung: Secondary | ICD-10-CM

## 2019-12-18 DIAGNOSIS — C649 Malignant neoplasm of unspecified kidney, except renal pelvis: Principal | ICD-10-CM | POA: Diagnosis present

## 2019-12-18 DIAGNOSIS — I77810 Thoracic aortic ectasia: Secondary | ICD-10-CM | POA: Diagnosis present

## 2019-12-18 DIAGNOSIS — R001 Bradycardia, unspecified: Secondary | ICD-10-CM | POA: Diagnosis not present

## 2019-12-18 DIAGNOSIS — I120 Hypertensive chronic kidney disease with stage 5 chronic kidney disease or end stage renal disease: Secondary | ICD-10-CM | POA: Diagnosis present

## 2019-12-18 DIAGNOSIS — Z8249 Family history of ischemic heart disease and other diseases of the circulatory system: Secondary | ICD-10-CM

## 2019-12-18 DIAGNOSIS — E1122 Type 2 diabetes mellitus with diabetic chronic kidney disease: Secondary | ICD-10-CM | POA: Diagnosis present

## 2019-12-18 DIAGNOSIS — Z951 Presence of aortocoronary bypass graft: Secondary | ICD-10-CM

## 2019-12-18 DIAGNOSIS — I9589 Other hypotension: Secondary | ICD-10-CM | POA: Diagnosis present

## 2019-12-18 DIAGNOSIS — C786 Secondary malignant neoplasm of retroperitoneum and peritoneum: Secondary | ICD-10-CM | POA: Diagnosis present

## 2019-12-18 DIAGNOSIS — I4510 Unspecified right bundle-branch block: Secondary | ICD-10-CM | POA: Diagnosis present

## 2019-12-18 LAB — CBC
HCT: 36.2 % — ABNORMAL LOW (ref 39.0–52.0)
Hemoglobin: 11 g/dL — ABNORMAL LOW (ref 13.0–17.0)
MCH: 31.6 pg (ref 26.0–34.0)
MCHC: 30.4 g/dL (ref 30.0–36.0)
MCV: 104 fL — ABNORMAL HIGH (ref 80.0–100.0)
Platelets: 145 10*3/uL — ABNORMAL LOW (ref 150–400)
RBC: 3.48 MIL/uL — ABNORMAL LOW (ref 4.22–5.81)
RDW: 15 % (ref 11.5–15.5)
WBC: 7 10*3/uL (ref 4.0–10.5)
nRBC: 0 % (ref 0.0–0.2)

## 2019-12-18 LAB — SARS CORONAVIRUS 2 BY RT PCR (HOSPITAL ORDER, PERFORMED IN ~~LOC~~ HOSPITAL LAB): SARS Coronavirus 2: NEGATIVE

## 2019-12-18 LAB — COMPREHENSIVE METABOLIC PANEL
ALT: 12 U/L (ref 0–44)
AST: 16 U/L (ref 15–41)
Albumin: 2.8 g/dL — ABNORMAL LOW (ref 3.5–5.0)
Alkaline Phosphatase: 85 U/L (ref 38–126)
Anion gap: 12 (ref 5–15)
BUN: 18 mg/dL (ref 8–23)
CO2: 32 mmol/L (ref 22–32)
Calcium: 9.6 mg/dL (ref 8.9–10.3)
Chloride: 97 mmol/L — ABNORMAL LOW (ref 98–111)
Creatinine, Ser: 2.94 mg/dL — ABNORMAL HIGH (ref 0.61–1.24)
GFR calc Af Amer: 24 mL/min — ABNORMAL LOW (ref >60)
GFR calc non Af Amer: 21 mL/min — ABNORMAL LOW (ref >60)
Glucose, Bld: 98 mg/dL (ref 70–99)
Potassium: 3.1 mmol/L — ABNORMAL LOW (ref 3.5–5.1)
Sodium: 141 mmol/L (ref 135–145)
Total Bilirubin: 0.6 mg/dL (ref 0.3–1.2)
Total Protein: 5.9 g/dL — ABNORMAL LOW (ref 6.5–8.1)

## 2019-12-18 LAB — PROTIME-INR
INR: 1.1 (ref 0.8–1.2)
Prothrombin Time: 14 seconds (ref 11.4–15.2)

## 2019-12-18 LAB — MAGNESIUM: Magnesium: 2.1 mg/dL (ref 1.7–2.4)

## 2019-12-18 LAB — TROPONIN I (HIGH SENSITIVITY): Troponin I (High Sensitivity): 483 ng/L (ref <18)

## 2019-12-18 LAB — LACTIC ACID, PLASMA: Lactic Acid, Venous: 1.1 mmol/L (ref 0.5–1.9)

## 2019-12-18 MED ORDER — SODIUM CHLORIDE 0.9 % IV SOLN
Freq: Once | INTRAVENOUS | Status: AC
  Administered 2019-12-18: 100 mL/h via INTRAVENOUS

## 2019-12-18 NOTE — ED Notes (Signed)
Date and time results received: 12/29/2019  (use smartphrase ".now" to insert current time)  Test: trop Critical Value: 483 Name of Provider Notified:knap Orders Received? Or Actions Taken?

## 2019-12-18 NOTE — ED Provider Notes (Signed)
[facility] EMERGENCY DEPARTMENT Provider Note   CSN: [id number] Arrival date & time: 12/14/2019  1928     History Chief Complaint  Patient presents with  . Generalized Body Aches    [name] is a 69 y.o. male with past medical history of ESRD on HD, DM 2, blind with both eyes removed, hypertension, prostate cancer, metastatic renal cancer, who presents today for evaluation of generally feeling unwell.  He reports that he had a wet cough that started Saturday night.  He states that at about 3 PM today he suddenly felt very poorly with chills, body aches.  He finished dialysis earlier this morning.  He had COVID-19 in December 2020, and has had 2 Covid vaccines since.  He reports that earlier he had chest pain however now his entire body just hurts.  He has been short of breath for about 30 minutes.  He states that he has to change what side he is lying on every 3 hours and notes that when he lays with the right side day when it hurts.  This is not new.  No aggravating or alleviating factors noted.  HPI     Past Medical History:  Diagnosis Date  . Anemia   . Arthritis    "back, neck" (07/28/2014)  . Blind    both eyes removed   . Bruises easily   . CAD, NATIVE VESSEL 01/08/2008      . Cellulitis late 1980's   "hospitalized; wrapped both legs; several times; no OR for this"  . Colon polyps   . Diabetic neuropathy ([place])   . Dialysis patient ([place])   . DM type 2 (diabetes mellitus, type 2) ([place])    no medications (07/28/2014) diet and excersie controlled not onmeds for 14-15 years   . Dysrhythmia    afib   . ESRD (end stage renal disease) on dialysis [facility])    [name]; Mon, Wed, Fri (07/28/2014)  . Fall    hx of fall and required left hip surgery   . Family history of breast cancer    mother  . Heart murmur    hx of  . History of blood product transfusion   . HYPERLIPIDEMIA-MIXED 01/08/2008  . HYPERTENSION 01/27/2009   BP low , hx of HTN,  Currently on meds to raise BP  . Hypotension   . Kidney stones   . Low blood pressure   . OVERWEIGHT/OBESITY 01/08/2008   Lost 205 lbs through diet and exercise.    . Peripheral vascular disease ([place])    vein stripping  . Pneumonia 2000;s X 1  . Prostate cancer ([place])   . Prosthetic eye globe    both eyes  . Renal cell carcinoma 2001 and 2003   "both kidneys"  . Renal insufficiency     Patient Active Problem List   Diagnosis Date Noted  . COVID-19 virus infection 03/14/2019  . Generalized weakness 03/14/2019  . Secondary and unspecified malignant neoplasm of intrathoracic lymph nodes ([place]) 11/15/2018  . Pressure injury of skin 03/21/2018  . Other pancytopenia ([place]) 03/17/2018  . CAP (community acquired pneumonia) 03/16/2018  . HCAP (healthcare-associated pneumonia) 03/16/2018  . Other persistent atrial fibrillation ([place]) 02/15/2018  . Renal cell cancer, right ([place]) 04/19/2017  . Macrocytic anemia 04/11/2016  . Diarrhea 04/11/2016  . Metastatic renal cell carcinoma ([place]) 04/07/2016  . Hx of exploratory laparotomy 04/07/2016  . Shock circulatory ([place]) 04/07/2016  . Pre-operative cardiovascular examination 02/19/2016  . Bacteremia   .  Abnormal CT of the abdomen   . Abdominal pain 11/01/2015  . UTI (lower urinary tract infection) 11/01/2015  . Sepsis ([place]) 11/01/2015  . Bilateral blindness 06/16/2015  . Carcinoma of kidney ([place]) 06/16/2015  . Type 2 diabetes mellitus ([place]) 06/16/2015  . Intra-abdominal infection 05/11/2015  . Chronic anemia 05/11/2015  . Infection in abdomen ([place]) 05/11/2015  . Second degree AV block, Mobitz type I 02/15/2015  . Atrioventricular block, Mobitz type 1, Wenckebach 02/15/2015  . Hypotension 02/14/2015  . Hypoglycemia 02/13/2015  . DM type 2, goal A1c below 7 02/13/2015  . Blind   . DM type 2 (diabetes mellitus, type 2) ([place])   . Clear cell renal cell carcinoma (HCC)   . CAD in native artery 09/10/2014  . Renal carcinoma ([place])   .  Non-intractable cyclical vomiting with nausea   . Hydronephrosis 07/28/2014  . Coccyx pain 01/03/2014  . Thrombocytopenia ([place]) 01/03/2014  . Intertrochanteric fracture of left hip ([place]) 12/30/2013  . Dialysis patient ([place]) 12/30/2013  . Hip fracture ([place]) 12/30/2013  . Dependence on renal dialysis ([place]) 12/30/2013  . Hypotension of hemodialysis 06/19/2013  . Malignant neoplasm of prostate ([place]) 01/30/2012  . Diabetes mellitus ([place]) 08/30/2011  . Peripheral vascular disease ([place]) 05/27/2009  . COLONIC POLYPS 02/12/2009  . ANEMIA 01/27/2009  . GLAUCOMA 01/27/2009  . Essential hypertension 01/27/2009  . ESRD (end stage renal disease) on dialysis ([place]) 01/27/2009  . End stage renal failure on dialysis ([place]) 01/27/2009  . DIAB W/UNS COMP TYPE II/UNS NOT STATED UNCNTRL 01/08/2008  . HYPERLIPIDEMIA-MIXED 01/08/2008  . OVERWEIGHT/OBESITY 01/08/2008  . DEPRESSIVE DISORDER NOT ELSEWHERE CLASSIFIED 01/08/2008  . Hx of CABG-2013 01/08/2008  . EDEMA 01/08/2008  . Clinical depression 01/08/2008  . HLD (hyperlipidemia) 01/08/2008    Past Surgical History:  Procedure Laterality Date  . AV FISTULA PLACEMENT Left 02/2010   LFA  . AV FISTULA PLACEMENT Left 01/12/2016   Procedure: LEFT BRACHIOCEPHALIC ARTERIOVENOUS (AV) FISTULA CREATION;  Surgeon: Angelia Mould, MD;  Location: [place];  Service: Vascular;  Laterality: Left;  . CHOLECYSTECTOMY OPEN  1992  . COLONOSCOPY  06/14/2011   Procedure: COLONOSCOPY;  Surgeon: [name], MD;  [facility];  Service: Endoscopy;  Laterality: N/A;  . COLONOSCOPY N/A 07/24/2012   Procedure: COLONOSCOPY;  Surgeon: [name], MD;  [facility];  Service: Endoscopy;  Laterality: N/A;  . COLONOSCOPY    . COLONOSCOPY N/A 11/04/2015   Procedure: COLONOSCOPY;  Surgeon: Manus Gunning, MD;  Location: [facility] ENDOSCOPY;  Service: Gastroenterology;  Laterality: N/A;  . COLONOSCOPY WITH PROPOFOL N/A 05/13/2014   Procedure: COLONOSCOPY  WITH PROPOFOL;  Surgeon: [name], MD;  [facility];  Service: Endoscopy;  Laterality: N/A;  . CORONARY ARTERY BYPASS GRAFT  09/29/2011   Procedure: CORONARY ARTERY BYPASS GRAFTING (CABG);  Surgeon: [name], MD;  Location: [place];  Service: Open Heart Surgery;  Laterality: N/A;  Coronary Artery Bypass Graft times two utilizing the left internal mammary artery and the right greater saphenous vein harvested endoscopically.  . CYSTOSCOPY WITH RETROGRADE PYELOGRAM, URETEROSCOPY AND STENT PLACEMENT Left 07/28/2014   Procedure: CYSTOSCOPY WITH RETROGRADE PYELOGRAM, URETERAL BALLOON DILITATION, URETEROSCOPY AND LEFT STENT PLACEMENT;  Surgeon: [name], MD;  [facility];  Service: Urology;  Laterality: Left;  . ENUCLEATION  2003; 2006   bilateral; "diabetes; pain"  . FOOT AMPUTATION Right ~ 2002   right;  partial; "infection"  . FRACTURE SURGERY     hip fx  .  HIP PINNING,CANNULATED Left 12/31/2013   Procedure: CANNULATED HIP PINNING;  Surgeon: Renette Butters, MD;  Location: [place];  Service: Orthopedics;  Laterality: Left;  Carm, FX Table, [employer]ryker  . HOT HEMOSTASIS  06/14/2011   Procedure: HOT HEMOSTASIS (ARGON PLASMA COAGULATION/BICAP);  Surgeon: [name], MD;  [facility];  Service: Endoscopy;  Laterality: N/A;  . INSERTION OF DIALYSIS CATHETER Right 01/12/2016   Procedure: INSERTION OF DIALYSIS CATHETER;  Surgeon: Angelia Mould, MD;  Location: [place];  Service: Vascular;  Laterality: Right;  . INSERTION PROSTATE RADIATION SEED  03/2012  . IR GENERIC HISTORICAL  01/13/2016   IR RADIOLOGIST EVAL & MGMT 01/13/2016 Aletta Edouard, MD [facility] INTERV RAD  . IR RADIOLOGIST EVAL & MGMT  03/02/2017  . IR RADIOLOGIST EVAL & MGMT  05/18/2017  . IR RADIOLOGIST EVAL & MGMT  07/27/2017  . LAPAROTOMY N/A 04/07/2016   Procedure: EXPLORATORY LAPAROTOMY AND RESECTION OF RETROPERITONEAL MASS;  Surgeon: Raynelle Bring, MD;  [facility];  Service: Urology;   Laterality: N/A;  . LEFT HEART CATHETERIZATION WITH CORONARY ANGIOGRAM N/A 09/27/2011   Procedure: LEFT HEART CATHETERIZATION WITH CORONARY ANGIOGRAM;  Surgeon: [name], MD;  Location: [facility] CATH LAB;  Service: Cardiovascular;  Laterality: N/A;  . LIGATION OF ARTERIOVENOUS  FISTULA Left 01/12/2016   Procedure: LIGATION OF LEFT RADIOCEPHALIC ARTERIOVENOUS  FISTULA;  Surgeon: Angelia Mould, MD;  Location: [place];  Service: Vascular;  Laterality: Left;  . LIGATION OF COMPETING BRANCHES OF ARTERIOVENOUS FISTULA Left 07/29/2014   Procedure: LIGATION OF COMPETING BRANCHES OF LEFT ARM ARTERIOVENOUS FISTULA;  Surgeon: [name], MD;  Location: [place];  Service: Vascular;  Laterality: Left;  . NEPHRECTOMY Right 01/30/2015   done at [place]  . PARTIAL COLECTOMY Right 04/07/2016   Procedure: RIGHT COLECTOMY AND PARTIAL LIVER RESECTION;  Surgeon: Raynelle Bring, MD;  [facility];  Service: Urology;  Laterality: Right;  . RADIOFREQUENCY ABLATION N/A 04/19/2017   Procedure: CT MICROWAVE THERMAL ABLATION;  Surgeon: Aletta Edouard, MD;  [facility];  Service: Anesthesiology;  Laterality: N/A;  . RADIOFREQUENCY ABLATION KIDNEY  2010-2012   "twice; one on each side; for cancer"  . REVISON OF ARTERIOVENOUS FISTULA Left 09/01/2015   Procedure: EXPLORATION LEFT LOWER ARM  RADIOCEPHALIC ARTERIOVENOUS FISTULA;  Surgeon: Conrad St. Bernard, MD;  Location: [place];  Service: Vascular;  Laterality: Left;  [name] VARICOSE VEIN SURGERY  mid 1980's    BLE; "knees down; both legs; 2 separate times"       Family History  Problem Relation Age of Onset  . Cancer Mother 14       breast, spine and ovarian  . Heart disease Mother   . Hyperlipidemia Mother   . Hypertension Mother   . Varicose Veins Mother   . Emphysema Father   . Cancer Father 33       kidney, prostate  . Heart disease Father   . Hyperlipidemia Father   . Hypertension Father   . Cancer Sister        ovarian  . Cancer Brother         lung  . Heart disease Brother   . Hyperlipidemia Brother   . Hypertension Brother   . Heart attack Brother   . Peripheral vascular disease Brother   . Cancer Other   . Coronary artery disease Other   . Kidney disease Other   . Breast cancer Other   . Ovarian cancer Other   . Lung  cancer Other   . Diabetes Other   . Malignant hyperthermia Neg Hx     Social History   Tobacco Use  . Smoking status: Never Smoker  . Smokeless tobacco: Never Used  Vaping Use  . Vaping Use: Never used  Substance Use Topics  . Alcohol use: No    Alcohol/week: 0.0 standard drinks  . Drug use: No    Home Medications Prior to Admission medications   Medication Sig Start Date End Date Taking? Authorizing Provider  acetaminophen (TYLENOL) 325 MG tablet Take 2 tablets (650 mg total) by mouth every 6 (six) hours as needed for mild pain or headache (fever >/= 101). 03/28/19   Pokhrel, Corrie Mckusick, MD  albuterol (VENTOLIN HFA) 108 (90 Base) MCG/ACT inhaler Inhale 2 puffs into the lungs every 6 (six) hours as needed for wheezing or shortness of breath. 03/28/19   Pokhrel, Corrie Mckusick, MD  Amino Acids-Protein Hydrolys (FEEDING SUPPLEMENT, PRO-STAT SUGAR FREE 64,) LIQD Take 30 mLs by mouth 2 (two) times daily. 03/28/19   Pokhrel, Corrie Mckusick, MD  atorvastatin (LIPITOR) 10 MG tablet Take 10 mg by mouth daily.    [provider]  benzonatate (TESSALON) 100 MG capsule Take 100 mg by mouth every 8 (eight) hours as needed for cough.    [provider]  cyclobenzaprine (FLEXERIL) 10 MG tablet Take 10 mg by mouth every 12 (twelve) hours as needed. Neck pain 09/26/18   [provider]  ferrous sulfate 325 (65 FE) MG tablet Take 325 mg by mouth See admin instructions. 5am on Monday, Wednesday, Friday 8am on Sunday, Tuesday, Thursday, Saturday    [provider]  JANUVIA 25 MG tablet Take 25 mg by mouth See admin instructions. 8am on Sunday, Tuesday, Thursday, Saturday 5am on [place], Wednesday, Friday  03/04/19   [provider]  midodrine (PROAMATINE) 5 MG tablet Take 1 tablet (5 mg total) by mouth 3 (three) times daily with meals. 03/28/19   Pokhrel, Corrie Mckusick, MD  multivitamin (RENA-VIT) TABS tablet Take 1 tablet by mouth daily.    [provider]  Nutritional Supplements (FEEDING SUPPLEMENT, NEPRO CARB STEADY,) LIQD Take 237 mLs by mouth 2 (two) times daily between meals. 03/28/19   Pokhrel, Corrie Mckusick, MD  ondansetron (ZOFRAN) 4 MG tablet Take 1 tablet (4 mg total) by mouth every 6 (six) hours as needed for nausea. 03/28/19   Pokhrel, Corrie Mckusick, MD  Probiotic Product (ALIGN) 4 MG CAPS Take 1 capsule by mouth See admin instructions. Take at 8am on Sunday, Tuesday, Thursday, Saturday Take at 5am on Monday, Wednesday, Friday    [provider]  sevelamer carbonate (RENVELA) 800 MG tablet Take 2,400-4,800 mg by mouth See admin instructions. Take 6 tablets at 8am (4800 mg) by mouth 3 times daily with meals on Tuesday, Thursday, Saturday, Sunday Take 6 tablets at 5am (4800 mg) by mouth 3 times daily with meals on Monday, Wednesday, Friday  Take 3 tablets (2400 mg) 2 times daily with snacks 06/26/14   [provider]  warfarin (COUMADIN) 1 MG tablet Take 1 tablet (1 mg total) by mouth daily. Adjust for INR 2-3, hold for 12/24 and 12/25 and start on 03/30/19 if INR is between 2-3 03/30/19 03/29/20  Flora Lipps, MD    Allergies    Codeine and Tape  Review of Systems   Review of Systems  Constitutional: Positive for chills. Negative for fever.  Respiratory: Positive for cough and shortness of breath.   Cardiovascular: Positive for chest pain.  Gastrointestinal: Positive for abdominal  pain. Negative for nausea and vomiting.  Musculoskeletal: Positive for arthralgias and myalgias.  Neurological: Negative for weakness and headaches.  Psychiatric/Behavioral: Negative for confusion.  All other systems reviewed and are negative.   Physical Exam Updated Vital  Signs BP (!) 112/45   Pulse (!) 54   Temp (!) 96.3 F (35.7 C) (Rectal)   Resp (!) 24   Ht 6\' 4"  (1.93 m)   Wt 110.7 kg   SpO2 99%   BMI 29.71 kg/m   Physical Exam Vitals and nursing note reviewed.  Constitutional:      Appearance: He is ill-appearing.  HENT:     Head: Normocephalic and atraumatic.  Eyes:     Comments: Eye lids with mild green discharge bilaterally. Patient is legally blind.   Cardiovascular:     Rate and Rhythm: Normal rate.     Pulses: Normal pulses.     Heart sounds: Normal heart sounds.  Pulmonary:     Effort: No respiratory distress.     Comments: Frequent, nonproductive, wet sounding cough. Breath sounds absent on right side. Abdominal:     Tenderness: There is abdominal tenderness (Generalized).  Genitourinary:    Comments: Deferred Musculoskeletal:        General: No signs of injury.     Cervical back: No rigidity.  Skin:    General: Skin is warm and dry.  Neurological:     Mental Status: He is alert. Mental status is at baseline.     Comments: Awake and alert, answers all questions appropriately.  Speech is not slurred.    Psychiatric:        Mood and Affect: Mood normal.        Behavior: Behavior normal.     ED Results / Procedures / Treatments   Labs (all labs ordered are listed, but only abnormal results are displayed) Labs Reviewed  CBC - Abnormal; Notable for the following components:      Result Value   RBC 3.48 (*)    Hemoglobin 11.0 (*)    HCT 36.2 (*)    MCV 104.0 (*)    Platelets 145 (*)    All other components within normal limits  COMPREHENSIVE METABOLIC PANEL - Abnormal; Notable for the following components:   Potassium 3.1 (*)    Chloride 97 (*)    Creatinine, Ser 2.94 (*)    Total Protein 5.9 (*)    Albumin 2.8 (*)    GFR calc non Af Amer 21 (*)    GFR calc Af Amer 24 (*)    All other components within normal limits  TROPONIN I (HIGH SENSITIVITY) - Abnormal; Notable for the following components:   Troponin I  (High Sensitivity) 483 (*)    All other components within normal limits  SARS CORONAVIRUS 2 BY RT PCR (HOSPITAL ORDER, [facility])  CULTURE, BLOOD (ROUTINE X 2)  CULTURE, BLOOD (ROUTINE X 2)  LACTIC ACID, PLASMA  MAGNESIUM  PROTIME-INR  LACTIC ACID, PLASMA  TROPONIN I (HIGH SENSITIVITY)    EKG EKG Interpretation  Date/Time:  Wednesday December 18 2019 20:02:00 EDT Ventricular Rate:  67 PR Interval:    QRS Duration: 98 QT Interval:  456 QTC Calculation: 481 R Axis:   -38 Text Interpretation: Undetermined rhythm Left axis deviation Incomplete right bundle branch block Cannot rule out Anterior infarct , age undetermined ST & T wave abnormality, consider inferolateral ischemia Abnormal ECG No significant change since last tracing 30 minutes ago Confirmed by Tomi Bamberger,  Wille Glaser [phone]) on 12/30/2019 8:08:56 PM   Radiology DG Chest Portable 1 View  Result Date: 12/10/2019 CLINICAL DATA:  Shortness of breath EXAM: PORTABLE CHEST 1 VIEW COMPARISON:  04/07/2019 FINDINGS: 2 frontal views of the chest are obtained. There is complete opacification of the right hemithorax, with slight mediastinal shift to the left. This suggests underlying right pleural effusion as well as lung consolidation. The left chest is clear. No evidence pneumothorax. Stable postsurgical changes from median sternotomy. IMPRESSION: 1. Interval opacification of the right hemithorax consistent with effusion and consolidation. CT chest may be useful for further evaluation. Electronically Signed   By: Randa Ngo M.D.   On: 12/04/2019 20:15    Procedures Procedures (including critical care time)  Medications Ordered in ED Medications  0.9 %  sodium chloride infusion (100 mL/hr Intravenous New Bag/Given 12/15/2019 2237)    ED Course  I have reviewed the triage vital signs and the nursing notes.  Pertinent labs & imaging results that were available during my care of the patient were reviewed by me and  considered in my medical decision making (see chart for details).  Clinical Course as of Dec 17 2329  Wed Dec 18, 2019  2151 Noted, will obtain delta.  Given his ESRD unsure what baseline is for him as no prior in the system.   Troponin I (High Sensitivity)(!!): 483 [EH]  2152 Concern for right sided malignant pleural effusion.  Given his borderline hypotension here will obtain CTA chest and CT abdomen for further evaluation.   DG Chest Portable 1 View [EH]  2308 Patient reports that he would not want to be put on a ventilator, or have CPR.    [EH]    Clinical Course User Index [EH] Ollen Gross   MDM Rules/Calculators/A&P                         Patient is a 69 year old man who presents today for evaluation of shortness of breath, body aches and feeling unwell.  He started feeling unwell at 3pm today.  He went to dialysis today.   Labs are obtained, CBC is improved from prior.  CMP shows elevated creatinine and decreased GFR.  Patient is on ESRD, does not make any urine and is compliant with hemodialysis.  His potassium is slightly low at 3.1.  Magnesium is normal.  Covid is negative.  Lactic acid is not elevated, Blood cultures sent.  Troponin was 483, no prior for comparison.   Patients blood pressure runs low, he reports that 80s is normal BP for him.  He was given gentle hydration.    Chest x-ray was obtained showing opacification of the right sided thorax.  There is significantly decreased lung sounds.  No tracheal deviation. Does not appear acute.  CTA PE study, given hypotension and new oxygen need, along with CT abdomen obtained to further evaluate effusion and possible underlying metastatic process.   Note: Portions of this report may have been transcribed using voice recognition software. Every effort was made to ensure accuracy; however, inadvertent computerized transcription errors may be present  At shift change care was transferred to [facility] who will  follow pending studies, re-evaulate and determine disposition.    [name] was evaluated in Emergency Department on 01/02/2020 for the symptoms described in the history of present illness. He was evaluated in the context of the global COVID-19 pandemic, which necessitated consideration that the patient might be at risk  for infection with the SARS-CoV-2 virus that causes COVID-19. Institutional protocols and algorithms that pertain to the evaluation of patients at risk for COVID-19 are in a state of rapid change based on information released by regulatory bodies including the CDC and federal and state organizations. These policies and algorithms were followed during the patient's care in the ED.  Final Clinical Impression(s) / ED Diagnoses Final diagnoses:  Pleural effusion on right  End stage renal failure on dialysis [facility])    Rx / DC Orders ED Discharge Orders    None       Ollen Gross 12/10/2019 2352    Dorie Rank, MD 01-08-20 1540

## 2019-12-18 NOTE — ED Notes (Signed)
The son called and would now like to know the covid status for the pt his number is in the chart

## 2019-12-18 NOTE — ED Triage Notes (Signed)
Pt comes from [place] via [place] for body aches that have been going on for that past four hours after dialysis, no fevers, pt is unvaccinated

## 2019-12-19 ENCOUNTER — Inpatient Hospital Stay (HOSPITAL_COMMUNITY): Payer: Medicare Other

## 2019-12-19 ENCOUNTER — Emergency Department (HOSPITAL_COMMUNITY): Payer: Medicare Other

## 2019-12-19 ENCOUNTER — Encounter (HOSPITAL_COMMUNITY): Payer: Self-pay | Admitting: Family Medicine

## 2019-12-19 DIAGNOSIS — I482 Chronic atrial fibrillation, unspecified: Secondary | ICD-10-CM

## 2019-12-19 DIAGNOSIS — J9601 Acute respiratory failure with hypoxia: Secondary | ICD-10-CM | POA: Diagnosis present

## 2019-12-19 DIAGNOSIS — R778 Other specified abnormalities of plasma proteins: Secondary | ICD-10-CM | POA: Diagnosis not present

## 2019-12-19 DIAGNOSIS — R0902 Hypoxemia: Secondary | ICD-10-CM | POA: Insufficient documentation

## 2019-12-19 DIAGNOSIS — R9389 Abnormal findings on diagnostic imaging of other specified body structures: Secondary | ICD-10-CM | POA: Diagnosis present

## 2019-12-19 DIAGNOSIS — Z20822 Contact with and (suspected) exposure to covid-19: Secondary | ICD-10-CM | POA: Diagnosis present

## 2019-12-19 DIAGNOSIS — C786 Secondary malignant neoplasm of retroperitoneum and peritoneum: Secondary | ICD-10-CM | POA: Diagnosis present

## 2019-12-19 DIAGNOSIS — C649 Malignant neoplasm of unspecified kidney, except renal pelvis: Secondary | ICD-10-CM | POA: Diagnosis present

## 2019-12-19 DIAGNOSIS — D696 Thrombocytopenia, unspecified: Secondary | ICD-10-CM | POA: Diagnosis present

## 2019-12-19 DIAGNOSIS — G9341 Metabolic encephalopathy: Secondary | ICD-10-CM | POA: Diagnosis present

## 2019-12-19 DIAGNOSIS — Z66 Do not resuscitate: Secondary | ICD-10-CM | POA: Diagnosis present

## 2019-12-19 DIAGNOSIS — J9811 Atelectasis: Secondary | ICD-10-CM | POA: Diagnosis present

## 2019-12-19 DIAGNOSIS — E8889 Other specified metabolic disorders: Secondary | ICD-10-CM | POA: Diagnosis present

## 2019-12-19 DIAGNOSIS — N2581 Secondary hyperparathyroidism of renal origin: Secondary | ICD-10-CM | POA: Diagnosis present

## 2019-12-19 DIAGNOSIS — Z992 Dependence on renal dialysis: Secondary | ICD-10-CM

## 2019-12-19 DIAGNOSIS — I120 Hypertensive chronic kidney disease with stage 5 chronic kidney disease or end stage renal disease: Secondary | ICD-10-CM | POA: Diagnosis present

## 2019-12-19 DIAGNOSIS — R1909 Other intra-abdominal and pelvic swelling, mass and lump: Secondary | ICD-10-CM | POA: Diagnosis present

## 2019-12-19 DIAGNOSIS — D630 Anemia in neoplastic disease: Secondary | ICD-10-CM | POA: Diagnosis present

## 2019-12-19 DIAGNOSIS — J910 Malignant pleural effusion: Secondary | ICD-10-CM | POA: Diagnosis present

## 2019-12-19 DIAGNOSIS — E1122 Type 2 diabetes mellitus with diabetic chronic kidney disease: Secondary | ICD-10-CM | POA: Diagnosis present

## 2019-12-19 DIAGNOSIS — R9431 Abnormal electrocardiogram [ECG] [EKG]: Secondary | ICD-10-CM

## 2019-12-19 DIAGNOSIS — Z515 Encounter for palliative care: Secondary | ICD-10-CM | POA: Diagnosis not present

## 2019-12-19 DIAGNOSIS — I2510 Atherosclerotic heart disease of native coronary artery without angina pectoris: Secondary | ICD-10-CM | POA: Diagnosis present

## 2019-12-19 DIAGNOSIS — Z8616 Personal history of COVID-19: Secondary | ICD-10-CM | POA: Diagnosis not present

## 2019-12-19 DIAGNOSIS — N186 End stage renal disease: Secondary | ICD-10-CM

## 2019-12-19 DIAGNOSIS — M8458XA Pathological fracture in neoplastic disease, other specified site, initial encounter for fracture: Secondary | ICD-10-CM | POA: Diagnosis present

## 2019-12-19 DIAGNOSIS — C7951 Secondary malignant neoplasm of bone: Secondary | ICD-10-CM | POA: Diagnosis present

## 2019-12-19 LAB — GLUCOSE, PLEURAL OR PERITONEAL FLUID: Glucose, Fluid: 90 mg/dL

## 2019-12-19 LAB — LACTATE DEHYDROGENASE, PLEURAL OR PERITONEAL FLUID: LD, Fluid: 60 U/L — ABNORMAL HIGH (ref 3–23)

## 2019-12-19 LAB — TROPONIN I (HIGH SENSITIVITY): Troponin I (High Sensitivity): 457 ng/L (ref <18)

## 2019-12-19 LAB — BODY FLUID CELL COUNT WITH DIFFERENTIAL
Lymphs, Fluid: 43 %
Monocyte-Macrophage-Serous Fluid: 55 % (ref 50–90)
Neutrophil Count, Fluid: 2 % (ref 0–25)
Total Nucleated Cell Count, Fluid: 28 cu mm (ref 0–1000)

## 2019-12-19 LAB — PROTEIN, PLEURAL OR PERITONEAL FLUID: Total protein, fluid: 3.3 g/dL

## 2019-12-19 MED ORDER — LIDOCAINE HCL (PF) 1 % IJ SOLN
15.0000 mL | Freq: Once | INTRAMUSCULAR | Status: DC
  Filled 2019-12-19 (×2): qty 15

## 2019-12-19 MED ORDER — GLYCOPYRROLATE 0.2 MG/ML IJ SOLN: 0.2000 mg | INTRAMUSCULAR | Status: DC | PRN

## 2019-12-19 MED ORDER — LORAZEPAM 2 MG/ML IJ SOLN: 2.0000 mg | INTRAMUSCULAR | Status: DC | PRN

## 2019-12-19 MED ORDER — FENTANYL CITRATE (PF) 100 MCG/2ML IJ SOLN: 25.0000 ug | INTRAMUSCULAR | Status: DC | PRN

## 2019-12-19 MED ORDER — MIDODRINE HCL 5 MG PO TABS
5.0000 mg | ORAL_TABLET | Freq: Three times a day (TID) | ORAL | Status: DC
  Administered 2019-12-19: 5 mg via ORAL
  Filled 2019-12-19: qty 1

## 2019-12-19 MED ORDER — CHLORHEXIDINE GLUCONATE CLOTH 2 % EX PADS: 6.0000 | MEDICATED_PAD | Freq: Every day | CUTANEOUS | Status: DC

## 2019-12-19 MED ORDER — WARFARIN SODIUM 2 MG PO TABS
2.0000 mg | ORAL_TABLET | Freq: Once | ORAL | Status: DC
  Filled 2019-12-19: qty 1

## 2019-12-19 MED ORDER — NEPRO/CARBSTEADY PO LIQD: 237.0000 mL | Freq: Two times a day (BID) | ORAL | Status: DC

## 2019-12-19 MED ORDER — ONDANSETRON HCL 4 MG/2ML IJ SOLN: 4.0000 mg | Freq: Four times a day (QID) | INTRAMUSCULAR | Status: DC | PRN

## 2019-12-19 MED ORDER — WARFARIN - PHARMACIST DOSING INPATIENT: Freq: Every day | Status: DC

## 2019-12-19 MED ORDER — GLYCOPYRROLATE 1 MG PO TABS: 1.0000 mg | ORAL_TABLET | ORAL | Status: DC | PRN

## 2019-12-19 MED ORDER — IOHEXOL 350 MG/ML SOLN
100.0000 mL | Freq: Once | INTRAVENOUS | Status: AC | PRN
  Administered 2019-12-19: 100 mL via INTRAVENOUS

## 2019-12-19 MED ORDER — DIPHENHYDRAMINE HCL 50 MG/ML IJ SOLN: 25.0000 mg | INTRAMUSCULAR | Status: DC | PRN

## 2019-12-19 MED ORDER — ACETAMINOPHEN 650 MG RE SUPP: 650.0000 mg | Freq: Four times a day (QID) | RECTAL | Status: DC | PRN

## 2019-12-19 MED ORDER — PRO-STAT SUGAR FREE PO LIQD: 30.0000 mL | Freq: Two times a day (BID) | ORAL | Status: DC

## 2019-12-19 MED ORDER — CYCLOBENZAPRINE HCL 10 MG PO TABS: 10.0000 mg | ORAL_TABLET | Freq: Two times a day (BID) | ORAL | Status: DC | PRN

## 2019-12-19 MED ORDER — BENZONATATE 100 MG PO CAPS: 100.0000 mg | ORAL_CAPSULE | Freq: Three times a day (TID) | ORAL | Status: DC | PRN

## 2019-12-19 MED ORDER — LINAGLIPTIN 5 MG PO TABS: 5.0000 mg | ORAL_TABLET | Freq: Every day | ORAL | Status: DC

## 2019-12-19 MED ORDER — ONDANSETRON 4 MG PO TBDP: 4.0000 mg | ORAL_TABLET | Freq: Four times a day (QID) | ORAL | Status: DC | PRN

## 2019-12-19 MED ORDER — SODIUM CHLORIDE 0.9% FLUSH: 10.0000 mL | Freq: Three times a day (TID) | INTRAVENOUS | Status: DC

## 2019-12-19 MED ORDER — POLYVINYL ALCOHOL 1.4 % OP SOLN: 1.0000 [drp] | Freq: Four times a day (QID) | OPHTHALMIC | Status: DC | PRN

## 2019-12-19 MED ORDER — ACETAMINOPHEN 325 MG PO TABS: 650.0000 mg | ORAL_TABLET | Freq: Four times a day (QID) | ORAL | Status: DC | PRN

## 2019-12-19 MED ORDER — LIDOCAINE HCL 1 % IJ SOLN: 15.0000 mL | Freq: Once | INTRAMUSCULAR | Status: DC

## 2019-12-19 MED ORDER — DEXTROSE 5 % IV SOLN: INTRAVENOUS | Status: DC

## 2019-12-20 LAB — CYTOLOGY - NON PAP

## 2019-12-22 LAB — BODY FLUID CULTURE
Culture: NO GROWTH
Gram Stain: NONE SEEN

## 2019-12-22 LAB — CHOLESTEROL, BODY FLUID: Cholesterol, Fluid: 43 mg/dL

## 2019-12-23 LAB — CULTURE, BLOOD (ROUTINE X 2)
Culture: NO GROWTH
Special Requests: ADEQUATE

## 2020-01-03 NOTE — Progress Notes (Signed)
Nursing notified us that pt had removed the chest tube. Had come down to replace found pt lethargic w/ very poor cough and hypotensive.   Imaging at bedside reviewed persistent large fluid collection in the pleural space. We turned him on left side to facilitate right sided CT insertion. Not long after repositioning he developed change in respiratory pattern, agonal efforts, and bradycardia. The son was notified via phone. Procedure aborted.   Primary team notified.  Think it is time to transition to comfort.

## 2020-01-03 NOTE — Progress Notes (Signed)
ANTICOAGULATION CONSULT NOTE - Initial Consult  Pharmacy Consult for Warfarin Indication: atrial fibrillation  Allergies  Allergen Reactions  . Codeine Other (See Comments)     Cascade  . Tape Other (See Comments)    Plastic tape tears skin off, please use paper tape instead.    Patient Measurements: Height: 6\' 4"  (193 cm) Weight: 110.7 kg (244 lb 0.8 oz) IBW/kg (Calculated) : 86.8  Vital Signs: Temp: 96.3 F (35.7 C) (09/15 2145) Temp Source: Rectal (09/15 2145) BP: 100/76 (09/16 0500) Pulse Rate: 68 (09/16 0500)  Labs: Recent Labs    12/17/2019 2014 12/29/2019 2041 12/17/2019 2046 12/18/19 2328  HGB 11.0*  --   --   --   HCT 36.2*  --   --   --   PLT 145*  --   --   --   LABPROT  --   --  14.0  --   INR  --   --  1.1  --   CREATININE  --  2.94*  --   --   TROPONINIHS  --  483*  --  457*    Estimated Creatinine Clearance: 31.9 mL/min (A) (by C-G formula based on SCr of 2.94 mg/dL (H)).   Medical History: Past Medical History:  Diagnosis Date  . Anemia   . Arthritis    "back, neck" (07/28/2014)  . Blind    both eyes removed   . Bruises easily   . CAD, NATIVE VESSEL 01/08/2008      . Cellulitis late 1980's   "hospitalized; wrapped both legs; several times; no OR for this"  . Colon polyps   . Diabetic neuropathy ([place])   . Dialysis patient ([place])   . DM type 2 (diabetes mellitus, type 2) ([place])    no medications (07/28/2014) diet and excersie controlled not onmeds for 14-15 years   . Dysrhythmia    afib   . ESRD (end stage renal disease) on dialysis [facility])    [name]; Mon, Wed, Fri (07/28/2014)  . Fall    hx of fall and required left hip surgery   . Family history of breast cancer    mother  . Heart murmur    hx of  . History of blood product transfusion   . HYPERLIPIDEMIA-MIXED 01/08/2008  . HYPERTENSION 01/27/2009   BP low , hx of HTN, Currently on meds to raise BP  . Hypotension   . Kidney stones   . Low blood pressure   .  OVERWEIGHT/OBESITY 01/08/2008   Lost 205 lbs through diet and exercise.    . Peripheral vascular disease ([place])    vein stripping  . Pneumonia 2000;s X 1  . Prostate cancer ([place])   . Prosthetic eye globe    both eyes  . Renal cell carcinoma 2001 and 2003   "both kidneys"  . Renal insufficiency     Medications:  Awaiting electronic med rec  Assessment: 69 y.o. M presents with fever/chills - chest CT shows large pleural effusion. Plan to drain effusion today. Pt on coumadin PTA for afib. Admission INR 1.1 (subtherapeutic).  Goal of Therapy:  INR 2-3 Monitor platelets by anticoagulation protocol: Yes   Plan:  Daily INR Will clarify home dose with pt - tried to call into room but pt was unable to tell me his home dose  Sherlon Handing, PharmD, BCPS Please see amion for complete clinical pharmacist phone list 12-28-19,6:08 AM

## 2020-01-03 NOTE — Consult Note (Addendum)
Cardiology Consultation:   Patient ID: VICK FILTER MRN: [id number]; DOB: 01-04-1951  Admit date: 12/13/2019 Date of Consult: 2019-12-23  Primary Care Provider: [name], MD [facility] Cardiologist: [name], MD  [facility] Electrophysiologist:  None    Patient Profile:   KEMON DEVINCENZI is a 69 y.o. male with a hx of ESRD on HD, diabetes, blindness, hypertension, prostate cancer, metastatic renal cancer, CAD status post CABG and PAF who is being seen today for the evaluation of chest pain/elevated troponin at the request of Dr. Lupita Leash.  History of Present Illness:   Mr. Ingalls is a 69 year old male with past medical history noted above.  He has been followed by Dr. Percival Spanish as an outpatient.  Underwent Myoview in 2013 which was abnormal.  Cardiac cath at that time demonstrated significant disease and he underwent CABG with LIMA to the LAD, and SVG to OM.  He had a right nephrectomy in October 2016 for recurrent renal cancer.  Was placed on the renal transplant list, but ultimately had his kidney removed.  Subsequently he had a resection of a retroperitoneal mass that was found to be clear cell.  He was seen in the office for bradycardia and wore a monitor and noted to have a heart rate of 29 episodically with 2-1 block and sinus bradycardia.  However because of his severe comorbid conditions it was suggested he was high risk for pacing given risk of infection.  He was seen in the office on 02/2018 for follow-up to discuss surgical risk regarding a chest wall resection for mass thought to be renal cell cancer metastasis.  It was felt overall his risk would be high because of his debility and chronic comorbidities that were noncardiac.  No further cardiac testing would alter this.  He was noted to be in atrial fibrillation at that office visit but rate controlled.  There was discussion regarding anticoagulation in the future pending his surgical work-up.  He was admitted on  03/2019 with Covid pneumonia.  Cardiology was consulted in regards to atrial fibrillation.  He was seen in consultation by Dr. Lovena Le.  Given the severity of his comorbid conditions he was considered a poor candidate for pacing and would continue with watchful waiting.  Plan to avoid any AV nodal blocking agents.  He was brought to the ER on 9/16 with reports of wet cough that started several days prior.  Developed chills and body aches after finishing his dialysis that morning.  Also complained of chest pain, and shortness of breath.  In the ED he was found on chest x-ray to have complete opacification of the right hemithorax.  CT was done to further evaluate which confirmed mass in the right sided effusion with mediastinal shift.  Labs showed potassium 3.1, creatinine 2.94, albumin 2.8, high-sensitivity troponin 483>> 457, hemoglobin 11.  EKG showed rate controlled atrial fibrillation with left axis deviation and ST elevation in aVR, with ST depression in V4 through V6. He was seen by PCCM with plans for thoracentesis versus Pleurx catheter.  Also noted goals of care versus palliative care consultation given his comorbidities.  Per ED notes patient was reported DNR and would not desire intubation.  He was admitted to internal medicine for further management.  Developed worsening shortness of breath earlier in the morning, and systolic blood pressure dropped into the 70s.  Decision was made to place pigtail catheter. He has been seen by nephrology as well.    Past Medical History:  Diagnosis Date  Anemia    Arthritis    "back, neck" (07/28/2014)   Blind    both eyes removed    Bruises easily    CAD, NATIVE VESSEL 01/08/2008       Cellulitis late 1980's   "hospitalized; wrapped both legs; several times; no OR for this"   Colon polyps    Diabetic neuropathy ([place])    Dialysis patient ([place])    DM type 2 (diabetes mellitus, type 2) ([place])    no medications (07/28/2014) diet and excersie controlled  not onmeds for 14-15 years    Dysrhythmia    afib    ESRD (end stage renal disease) on dialysis [facility])    [employer]; Mon, Wed, Fri (07/28/2014)   Fall    hx of fall and required left hip surgery    Family history of breast cancer    mother   Heart murmur    hx of   History of blood product transfusion    HYPERLIPIDEMIA-MIXED 01/08/2008   HYPERTENSION 01/27/2009   BP low , hx of HTN, Currently on meds to raise BP   Hypotension    Kidney stones    Low blood pressure    OVERWEIGHT/OBESITY 01/08/2008   Lost 205 lbs through diet and exercise.     Peripheral vascular disease ([place])    vein stripping   Pneumonia 2000;s X 1   Prostate cancer ([place])    Prosthetic eye globe    both eyes   Renal cell carcinoma 2001 and 2003   "both kidneys"   Renal insufficiency     Past Surgical History:  Procedure Laterality Date   AV FISTULA PLACEMENT Left 02/2010   LFA   AV FISTULA PLACEMENT Left 01/12/2016   Procedure: LEFT BRACHIOCEPHALIC ARTERIOVENOUS (AV) FISTULA CREATION;  Surgeon: Angelia Mould, MD;  Location: [place];  Service: Vascular;  Laterality: Left;   [place]   COLONOSCOPY  06/14/2011   Procedure: COLONOSCOPY;  Surgeon: [name], MD;  [facility];  Service: Endoscopy;  Laterality: N/A;   COLONOSCOPY N/A 07/24/2012   Procedure: COLONOSCOPY;  Surgeon: [name], MD;  [facility];  Service: Endoscopy;  Laterality: N/A;   COLONOSCOPY     COLONOSCOPY N/A 11/04/2015   Procedure: COLONOSCOPY;  Surgeon: Manus Gunning, MD;  Location: [facility]OSCOPY;  Service: Gastroenterology;  Laterality: N/A;   COLONOSCOPY WITH PROPOFOL N/A 05/13/2014   Procedure: COLONOSCOPY WITH PROPOFOL;  Surgeon: [name], MD;  [facility];  Service: Endoscopy;  Laterality: N/A;   CORONARY ARTERY BYPASS GRAFT  09/29/2011   Procedure: CORONARY ARTERY BYPASS GRAFTING (CABG);  Surgeon: [name], MD;  Location: [place];  Service: Open Heart  Surgery;  Laterality: N/A;  Coronary Artery Bypass Graft times two utilizing the left internal mammary artery and the right greater saphenous vein harvested endoscopically.   CYSTOSCOPY WITH RETROGRADE PYELOGRAM, URETEROSCOPY AND STENT PLACEMENT Left 07/28/2014   Procedure: CYSTOSCOPY WITH RETROGRADE PYELOGRAM, URETERAL BALLOON DILITATION, URETEROSCOPY AND LEFT STENT PLACEMENT;  Surgeon: [name], MD;  [facility];  Service: Urology;  Laterality: Left;   ENUCLEATION  2003; 2006   bilateral; "diabetes; pain"   FOOT AMPUTATION Right ~ 2002   right;  partial; "infection"   FRACTURE SURGERY     hip fx   HIP PINNING,CANNULATED Left 12/31/2013   Procedure: CANNULATED HIP PINNING;  Surgeon: Renette Butters, MD;  Location: [place];  Service: Orthopedics;  Laterality: Left;  [place], [facility] [place],  [employer]   HOT HEMOSTASIS  06/14/2011   Procedure: HOT HEMOSTASIS (ARGON PLASMA COAGULATION/BICAP);  Surgeon: [name], MD;  Location: [facility];  Service: Endoscopy;  Laterality: N/A;   INSERTION OF DIALYSIS CATHETER Right 01/12/2016   Procedure: INSERTION OF DIALYSIS CATHETER;  Surgeon: Angelia Mould, MD;  Location: [place];  Service: Vascular;  Laterality: Right;   INSERTION PROSTATE RADIATION SEED  03/2012   IR GENERIC HISTORICAL  01/13/2016   IR RADIOLOGIST EVAL & MGMT 01/13/2016 Aletta Edouard, MD [facility] INTERV RAD   IR RADIOLOGIST EVAL & MGMT  03/02/2017   IR RADIOLOGIST EVAL & MGMT  05/18/2017   IR RADIOLOGIST EVAL & MGMT  07/27/2017   LAPAROTOMY N/A 04/07/2016   Procedure: EXPLORATORY LAPAROTOMY AND RESECTION OF RETROPERITONEAL MASS;  Surgeon: Raynelle Bring, MD;  [facility];  Service: Urology;  Laterality: N/A;   LEFT HEART CATHETERIZATION WITH CORONARY ANGIOGRAM N/A 09/27/2011   Procedure: LEFT HEART CATHETERIZATION WITH CORONARY ANGIOGRAM;  Surgeon: [name], MD;  Location: [facility] CATH LAB;  Service: Cardiovascular;  Laterality: N/A;   LIGATION OF ARTERIOVENOUS  FISTULA Left  01/12/2016   Procedure: LIGATION OF LEFT RADIOCEPHALIC ARTERIOVENOUS  FISTULA;  Surgeon: Angelia Mould, MD;  Location: [place];  Service: Vascular;  Laterality: Left;   LIGATION OF COMPETING BRANCHES OF ARTERIOVENOUS FISTULA Left 07/29/2014   Procedure: LIGATION OF COMPETING BRANCHES OF LEFT ARM ARTERIOVENOUS FISTULA;  Surgeon: [name], MD;  Location: [place];  Service: Vascular;  Laterality: Left;   NEPHRECTOMY Right 01/30/2015   done at [place] Right 04/07/2016   Procedure: RIGHT COLECTOMY AND PARTIAL LIVER RESECTION;  Surgeon: Raynelle Bring, MD;  [facility];  Service: Urology;  Laterality: Right;   RADIOFREQUENCY ABLATION N/A 04/19/2017   Procedure: CT MICROWAVE THERMAL ABLATION;  Surgeon: Aletta Edouard, MD;  [facility];  Service: Anesthesiology;  Laterality: N/A;   RADIOFREQUENCY ABLATION KIDNEY  2010-2012   "twice; one on each side; for cancer"   [place] Left 09/01/2015   Procedure: EXPLORATION LEFT LOWER ARM  RADIOCEPHALIC ARTERIOVENOUS FISTULA;  Surgeon: Conrad Haysi, MD;  Location: [place];  Service: Vascular;  Laterality: Left;   VARICOSE VEIN SURGERY  mid 1980's    BLE; "knees down; both legs; 2 separate times"     Home Medications:  Prior to Admission medications   Medication Sig Start Date End Date Taking? Authorizing Provider  acetaminophen (TYLENOL) 325 MG tablet Take 2 tablets (650 mg total) by mouth every 6 (six) hours as needed for mild pain or headache (fever >/= 101). 03/28/19   Pokhrel, Corrie Mckusick, MD  albuterol (VENTOLIN HFA) 108 (90 Base) MCG/ACT inhaler Inhale 2 puffs into the lungs every 6 (six) hours as needed for wheezing or shortness of breath. 03/28/19   Pokhrel, Corrie Mckusick, MD  Amino Acids-Protein Hydrolys (FEEDING SUPPLEMENT, PRO-STAT SUGAR FREE 64,) LIQD Take 30 mLs by mouth 2 (two) times daily. 03/28/19   Pokhrel, Corrie Mckusick, MD  atorvastatin (LIPITOR) 10 MG tablet Take 10 mg by mouth daily.    [provider]  benzonatate (TESSALON) 100 MG capsule Take 100 mg by mouth every 8 (eight) hours as needed for cough.    [provider]  cyclobenzaprine (FLEXERIL) 10 MG tablet Take 10 mg by mouth every 12 (twelve) hours as needed. Neck pain 09/26/18   [provider]  ferrous sulfate 325 (65 FE) MG tablet Take 325 mg by mouth See admin instructions. 5am on  Monday, Wednesday, Friday 8am on Sunday, Tuesday, Thursday, Saturday    [provider]  JANUVIA 25 MG tablet Take 25 mg by mouth See admin instructions. 8am on Sunday, Tuesday, Thursday, Saturday 5am on [place], Wednesday, Friday 03/04/19   [provider]  midodrine (PROAMATINE) 5 MG tablet Take 1 tablet (5 mg total) by mouth 3 (three) times daily with meals. 03/28/19   Pokhrel, Corrie Mckusick, MD  multivitamin (RENA-VIT) TABS tablet Take 1 tablet by mouth daily.    [provider]  Nutritional Supplements (FEEDING SUPPLEMENT, NEPRO CARB STEADY,) LIQD Take 237 mLs by mouth 2 (two) times daily between meals. 03/28/19   Pokhrel, Corrie Mckusick, MD  ondansetron (ZOFRAN) 4 MG tablet Take 1 tablet (4 mg total) by mouth every 6 (six) hours as needed for nausea. 03/28/19   Pokhrel, Corrie Mckusick, MD  Probiotic Product (ALIGN) 4 MG CAPS Take 1 capsule by mouth See admin instructions. Take at 8am on Sunday, Tuesday, Thursday, Saturday Take at 5am on Monday, Wednesday, Friday    [provider]  sevelamer carbonate (RENVELA) 800 MG tablet Take 2,400-4,800 mg by mouth See admin instructions. Take 6 tablets at 8am (4800 mg) by mouth 3 times daily with meals on Tuesday, Thursday, Saturday, Sunday Take 6 tablets at 5am (4800 mg) by mouth 3 times daily with meals on Monday, Wednesday, Friday  Take 3 tablets (2400 mg) 2 times daily with snacks 06/26/14   [provider]  warfarin (COUMADIN) 1 MG tablet Take 1 tablet (1 mg total) by mouth daily. Adjust for INR 2-3, hold for 12/24 and 12/25 and start on 03/30/19 if INR is  between 2-3 03/30/19 03/29/20  Flora Lipps, MD    Inpatient Medications: Scheduled Meds:  Chlorhexidine Gluconate Cloth  6 each Topical Q0600   feeding supplement (NEPRO CARB STEADY)  237 mL Oral BID BM   feeding supplement (PRO-STAT SUGAR FREE 64)  30 mL Oral BID   lidocaine (PF)  15 mL Intradermal Once   linagliptin  5 mg Oral Daily   midodrine  5 mg Oral TID WC   sodium chloride flush  10 mL Intracatheter Q8H   Warfarin - Pharmacist Dosing Inpatient   Does not apply q1600   Continuous Infusions:   PRN Meds: benzonatate, cyclobenzaprine  Allergies:    Allergies  Allergen Reactions   Codeine Other (See Comments)     STATES MAKES HIM COMATOSE   Tape Other (See Comments)    Plastic tape tears skin off, please use paper tape instead.    Social History:   Social History   Socioeconomic History   Marital status: Widowed    Spouse name: Not on file   Number of children: Not on file   Years of education: Not on file   Highest education level: Not on file  Occupational History   Not on file  Tobacco Use   Smoking status: Never Smoker   Smokeless tobacco: Never Used  Vaping Use   Vaping Use: Never used  Substance and Sexual Activity   Alcohol use: No    Alcohol/week: 0.0 standard drinks   Drug use: No   Sexual activity: Never  Other Topics Concern   Not on file  Social History Narrative   Not on file   Social Determinants of Health   Financial Resource Strain:    Difficulty of Paying Living Expenses: Not on file  Food Insecurity:    Worried About [place] in the Last Year: Not on file  Ran Out of Food in the Last Year: Not on file  Transportation Needs:    Lack of Transportation (Medical): Not on file   Lack of Transportation (Non-Medical): Not on file  Physical Activity:    Days of Exercise per Week: Not on file   Minutes of Exercise per Session: Not on file  Stress:    Feeling of Stress : Not on file  Social Connections:    Frequency of  Communication with Friends and Family: Not on file   Frequency of Social Gatherings with Friends and Family: Not on file   Attends Religious Services: Not on file   Active Member of Clubs or Organizations: Not on file   Attends Archivist Meetings: Not on file   Marital Status: Not on file  Intimate Partner Violence:    Fear of Current or Ex-Partner: Not on file   Emotionally Abused: Not on file   Physically Abused: Not on file   Sexually Abused: Not on file    Family History:    Family History  Problem Relation Age of Onset   Cancer Mother 75       breast, spine and ovarian   Heart disease Mother    Hyperlipidemia Mother    Hypertension Mother    Varicose Veins Mother    Emphysema Father    Cancer Father 36       kidney, prostate   Heart disease Father    Hyperlipidemia Father    Hypertension Father    Cancer Sister        ovarian   Cancer Brother        lung   Heart disease Brother    Hyperlipidemia Brother    Hypertension Brother    Heart attack Brother    Peripheral vascular disease Brother    Cancer Other    Coronary artery disease Other    Kidney disease Other    Breast cancer Other    Ovarian cancer Other    Lung cancer Other    Diabetes Other    Malignant hyperthermia Neg Hx      ROS:  Please see the history of present illness.   All other ROS reviewed and negative.     Physical Exam/Data:   Vitals:   2020-01-05 0900 Jan 05, 2020 0915 01/05/20 0930 01/05/20 0945  BP: (!) 71/47 (!) 85/49 (!) 76/48 (!) 70/54  Pulse: 71 (!) 41 73 63  Resp:      Temp:      TempSrc:      SpO2: 98% 98% 97% 99%  Weight:      Height:        Intake/Output Summary (Last 24 hours) at January 05, 2020 1053 Last data filed at 01/05/2020 0912 Gross per 24 hour  Intake --  Output 1700 ml  Net -1700 ml   Last 3 Weights 12/17/2019 03/24/2019 03/22/2019  Weight (lbs) 244 lb 0.8 oz 244 lb 238 lb  Weight (kg) 110.7 kg 110.678 kg 107.956 kg     Body mass index is 29.71  kg/m.  General: Frail cachectic ill-appearing older white male HEENT: normal Lymph: no adenopathy Neck: no JVD Endocrine:  No thryomegaly Vascular: No carotid bruits; FA pulses 2+ bilaterally without bruits  Cardiac:  normal S1, S2; irregularly irregular; no murmur  Lungs: Diminished throughout right, wheezes on left, CT to right chest Abd: soft, nontender, no hepatomegaly  Ext: no edema Musculoskeletal:  Partial right foot amp Skin: warm and dry  Neuro:  CNs 2-12 intact,  no focal abnormalities noted Psych:  Normal affect   EKG:  The EKG was personally reviewed and demonstrates:  Afib rate controlled, ST elevation aVR and depression in v4-v6.   Relevant CV Studies:  Echo: 03/2018  Study Conclusions   - Left ventricle: The cavity size was normal. Wall thickness was    increased in a pattern of mild LVH. Systolic function was normal.    The estimated ejection fraction was in the range of 50% to 55%.    Wall motion was normal; there were no regional wall motion    abnormalities.  - Aortic root: The aortic root was mildly dilated.  - Ascending aorta: The ascending aorta was mildly dilated.  - Left atrium: The atrium was mildly dilated.  - Right atrium: The atrium was mildly dilated.  - Pulmonary arteries: Systolic pressure was mildly to moderately    increased. PA peak pressure: 51 mm Hg (S).   Impressions:   - Low normal LV systolic function; mild LVH; mildly dilated aortic    root/ascending aorta; mild biatrial enlargement; mild TR with    mild to moderate pulmonary hypertension.   Laboratory Data:  High Sensitivity Troponin:   Recent Labs  Lab 12/08/2019 2041 12/07/2019 2328  TROPONINIHS 483* 457*     Chemistry Recent Labs  Lab 12/14/2019 2041  NA 141  K 3.1*  CL 97*  CO2 32  GLUCOSE 98  BUN 18  CREATININE 2.94*  CALCIUM 9.6  GFRNONAA 21*  GFRAA 24*  ANIONGAP 12    Recent Labs  Lab 12/30/2019 2041  PROT 5.9*  ALBUMIN 2.8*  AST 16  ALT 12  ALKPHOS 85   BILITOT 0.6   Hematology Recent Labs  Lab 12/12/2019 2014  WBC 7.0  RBC 3.48*  HGB 11.0*  HCT 36.2*  MCV 104.0*  MCH 31.6  MCHC 30.4  RDW 15.0  PLT 145*   BNPNo results for input(s): BNP, PROBNP in the last 168 hours.  DDimer No results for input(s): DDIMER in the last 168 hours.   Radiology/Studies:  CT Angio Chest PE W/Cm &/Or Wo Cm  Result Date: January 13, 2020 CLINICAL DATA:  Metastatic disease evaluation, body aches following dialysis renal cell carcinoma, prostate cancer chest pain short of breath EXAM: CT ANGIOGRAPHY CHEST CT ABDOMEN AND PELVIS WITH CONTRAST TECHNIQUE: Multidetector CT imaging of the chest was performed using the standard protocol during bolus administration of intravenous contrast. Multiplanar CT image reconstructions and MIPs were obtained to evaluate the vascular anatomy. Multidetector CT imaging of the abdomen and pelvis was performed using the standard protocol during bolus administration of intravenous contrast. CONTRAST:  164mL OMNIPAQUE IOHEXOL 350 MG/ML SOLN COMPARISON:  Chest x-ray 12/25/2019, CT chest abdomen pelvis 06/14/2018, PET CT 07/27/2017, CT chest abdomen and pelvis 07/04/2019 FINDINGS: CTA CHEST FINDINGS Cardiovascular: Satisfactory opacification of the pulmonary arteries to the segmental level. No evidence of pulmonary embolism. Nonaneurysmal aorta. Mild aortic atherosclerosis. Coronary vascular calcifications. Normal cardiac size. No significant pericardial effusion Mediastinum/Nodes: No thyroid mass. Esophagus within normal limits. The distal trachea appears narrowed above the bifurcation. There is filling of the right mainstem bronchus with fluid, debris or soft tissue. There is narrowed appearance of the left bronchus as well. Esophagus is displaced to the left. Possible enlarged subcarinal node though difficult to separate from adjacent collapsed lung. Lungs/Pleura: Massive right pleural effusion with atelectasis of the right. Shift of mediastinal  contents to the left. No pneumothorax. Left lower lobe bronchial wall thickening. Musculoskeletal: Soft tissue mass between the right  ninth and tenth ribs measures 2.1 x 1.1 cm, previously 2.2 x 1.1 cm. Slight increased size of pleural based mass at the inferior costophrenic recess measuring 4 by 2 cm, previously 1.7 x 1.2 cm. New tenth posterolateral rib fracture, possibly pathologic. Post sternotomy changes. Diffuse spondylosis of the spine. Review of the MIP images confirms the above findings. CT ABDOMEN and PELVIS FINDINGS Hepatobiliary: Status post cholecystectomy. No biliary dilatation. No focal hepatic abnormality Pancreas: Atrophic.  No inflammatory change. Spleen: Normal in size without focal abnormality. Adrenals/Urinary Tract: Adrenal glands are normal. Status post right nephrectomy. Atrophic left kidney with numerous cortical lesions many of which are subcentimeter. Some of the lesions demonstrate increased density. No hydronephrosis. Intrarenal vascular calcification. The bladder is unremarkable. Stomach/Bowel: The stomach is nonenlarged. No dilated small bowel. Status post partial right colectomy with unremarkable ileal colic anastomosis in the right abdomen. No acute wall thickening. Vascular/Lymphatic: Extensive vascular calcification without aneurysm. No suspicious adenopathy Reproductive: Multiple prostate seeds. Other: Negative for free air or free fluid. Increased edema within the right flank and lateral abdominal wall Musculoskeletal: Left intramedullary femoral rod. Ankylosis of the spine. No acute osseous abnormality. Review of the MIP images confirms the above findings. IMPRESSION: 1. Negative for acute pulmonary embolus. 2. Massive right pleural effusion with atelectasis of the right lung and shift of mediastinal contents to the left. There right mainstem bronchus is occluded with fluid, debris, or soft tissue and there is significant narrowing of the left mainstem bronchus, also with fluid,  tissue or debris in the bronchus. 3. Slight interval increase in size of pleural based mass at the right inferior costophrenic recess. The metastatic lesion between the right ninth and tenth ribs is unchanged in size. New right tenth posterolateral rib fracture, possibly pathologic. 4. Possible subcarinal adenopathy though difficult to separate from adjacent atelectatic lung. Consider chest CT follow-up after resolution of pleural effusion. 5. No CT evidence for acute intra-abdominal or pelvic abnormality. 6. Status post right nephrectomy. Atrophic left kidney with numerous cortical lesions of varying density, grossly unchanged. 7. Increased edema within the right flank and lateral abdominal wall. Aortic Atherosclerosis (ICD10-I70.0). Electronically Signed   By: Donavan Foil M.D.   On: 01-16-2020 01:19   CT Abdomen Pelvis W Contrast  Result Date: 2020/01/16 CLINICAL DATA:  Metastatic disease evaluation, body aches following dialysis renal cell carcinoma, prostate cancer chest pain short of breath EXAM: CT ANGIOGRAPHY CHEST CT ABDOMEN AND PELVIS WITH CONTRAST TECHNIQUE: Multidetector CT imaging of the chest was performed using the standard protocol during bolus administration of intravenous contrast. Multiplanar CT image reconstructions and MIPs were obtained to evaluate the vascular anatomy. Multidetector CT imaging of the abdomen and pelvis was performed using the standard protocol during bolus administration of intravenous contrast. CONTRAST:  122mL OMNIPAQUE IOHEXOL 350 MG/ML SOLN COMPARISON:  Chest x-ray 12/17/2019, CT chest abdomen pelvis 06/14/2018, PET CT 07/27/2017, CT chest abdomen and pelvis 07/04/2019 FINDINGS: CTA CHEST FINDINGS Cardiovascular: Satisfactory opacification of the pulmonary arteries to the segmental level. No evidence of pulmonary embolism. Nonaneurysmal aorta. Mild aortic atherosclerosis. Coronary vascular calcifications. Normal cardiac size. No significant pericardial effusion  Mediastinum/Nodes: No thyroid mass. Esophagus within normal limits. The distal trachea appears narrowed above the bifurcation. There is filling of the right mainstem bronchus with fluid, debris or soft tissue. There is narrowed appearance of the left bronchus as well. Esophagus is displaced to the left. Possible enlarged subcarinal node though difficult to separate from adjacent collapsed lung. Lungs/Pleura: Massive right pleural effusion with atelectasis  of the right. Shift of mediastinal contents to the left. No pneumothorax. Left lower lobe bronchial wall thickening. Musculoskeletal: Soft tissue mass between the right ninth and tenth ribs measures 2.1 x 1.1 cm, previously 2.2 x 1.1 cm. Slight increased size of pleural based mass at the inferior costophrenic recess measuring 4 by 2 cm, previously 1.7 x 1.2 cm. New tenth posterolateral rib fracture, possibly pathologic. Post sternotomy changes. Diffuse spondylosis of the spine. Review of the MIP images confirms the above findings. CT ABDOMEN and PELVIS FINDINGS Hepatobiliary: Status post cholecystectomy. No biliary dilatation. No focal hepatic abnormality Pancreas: Atrophic.  No inflammatory change. Spleen: Normal in size without focal abnormality. Adrenals/Urinary Tract: Adrenal glands are normal. Status post right nephrectomy. Atrophic left kidney with numerous cortical lesions many of which are subcentimeter. Some of the lesions demonstrate increased density. No hydronephrosis. Intrarenal vascular calcification. The bladder is unremarkable. Stomach/Bowel: The stomach is nonenlarged. No dilated small bowel. Status post partial right colectomy with unremarkable ileal colic anastomosis in the right abdomen. No acute wall thickening. Vascular/Lymphatic: Extensive vascular calcification without aneurysm. No suspicious adenopathy Reproductive: Multiple prostate seeds. Other: Negative for free air or free fluid. Increased edema within the right flank and lateral  abdominal wall Musculoskeletal: Left intramedullary femoral rod. Ankylosis of the spine. No acute osseous abnormality. Review of the MIP images confirms the above findings. IMPRESSION: 1. Negative for acute pulmonary embolus. 2. Massive right pleural effusion with atelectasis of the right lung and shift of mediastinal contents to the left. There right mainstem bronchus is occluded with fluid, debris, or soft tissue and there is significant narrowing of the left mainstem bronchus, also with fluid, tissue or debris in the bronchus. 3. Slight interval increase in size of pleural based mass at the right inferior costophrenic recess. The metastatic lesion between the right ninth and tenth ribs is unchanged in size. New right tenth posterolateral rib fracture, possibly pathologic. 4. Possible subcarinal adenopathy though difficult to separate from adjacent atelectatic lung. Consider chest CT follow-up after resolution of pleural effusion. 5. No CT evidence for acute intra-abdominal or pelvic abnormality. 6. Status post right nephrectomy. Atrophic left kidney with numerous cortical lesions of varying density, grossly unchanged. 7. Increased edema within the right flank and lateral abdominal wall. Aortic Atherosclerosis (ICD10-I70.0). Electronically Signed   By: Donavan Foil M.D.   On: Jan 02, 2020 01:19   DG CHEST PORT 1 VIEW  Result Date: 2020-01-02 CLINICAL DATA:  RIGHT chest tube placement EXAM: PORTABLE CHEST 1 VIEW COMPARISON:  September fifteenth 2021, 01/02/2020 FINDINGS: The cardiomediastinal silhouette is unchanged in contour and is partially obscured. Status post median sternotomy.Interval placement of a RIGHT-sided pigtail catheter. Revisualization of a large RIGHT pleural effusion with complete opacification of the RIGHT lung. No significant pneumothorax. Scattered LEFT basilar airspace opacities, likely atelectasis. Visualized abdomen is unremarkable. Multilevel degenerative changes of the thoracic  spine. IMPRESSION: Interval placement of RIGHT-sided pigtail catheter with persistent large RIGHT pleural effusion and complete opacification of the RIGHT lung. No significant pneumothorax. Electronically Signed   By: Valentino Saxon MD   On: 2020-01-02 09:08   DG Chest Portable 1 View  Result Date: 12/25/2019 CLINICAL DATA:  Shortness of breath EXAM: PORTABLE CHEST 1 VIEW COMPARISON:  04/07/2019 FINDINGS: 2 frontal views of the chest are obtained. There is complete opacification of the right hemithorax, with slight mediastinal shift to the left. This suggests underlying right pleural effusion as well as lung consolidation. The left chest is clear. No evidence pneumothorax. Stable postsurgical  changes from median sternotomy. IMPRESSION: 1. Interval opacification of the right hemithorax consistent with effusion and consolidation. CT chest may be useful for further evaluation. Electronically Signed   By: Randa Ngo M.D.   On: 12/07/2019 20:15    Assessment and Plan:   ZYERE JIMINEZ is a 69 y.o. male with a hx of ESRD on HD, diabetes, blindness, hypertension, prostate cancer, metastatic renal cancer, CAD status post CABG and PAF who is being seen today for the evaluation of chest pain/elevated troponin at the request of Dr. Lupita Leash.  1. Elevated troponin/chest pain: hsTn 487>>457 in the setting of massive right pleural effusion with mediastinal shift. Does have known hx of CAD with CABG in 2013. EKG is abnormal with ST elevation in aVR and depression in v4-v6 but he has extensive co-morbities including metastatic renal cell Ca. He is not a candidate for invasive procedures. Medical therapy is limited as well. Review with MD whether there is a role for heparin at this time.   2. Massive right sided pleural effusion: seen by PCCM, pigtail catheter placed this morning.  Currently 1.6 L output.  3. Hypotension: appears this is chronic at baseline but developed worsening hypotension this morning. Has been  on midodrine prior to admission. -- Currently blood pressure stable with systolic in the 25D.  4. PAF: rates controlled. On coumadin prior to admission, though INR subtherapeutic at 1.1.  --Coumadin has been continued per pharmacy.  5. ESRD on HD: nephrology following for HD needs  6. Metastatic renal Ca: followed by oncology  7. DM: per primary  For questions or updates, please contact [place] Please consult [email or web address] for contact info under    Signed, Reino Bellis, NP  December 22, 2019 10:53 AM  Patient examined chart reviewed Discussed care with PA. Exam with lethargic chronically ill white male. Marked decreased BS on right SEM some edema on lateral side of right chest wall and abdomen post nephrectomy and post sternotomy He has a history of slow afib with rates in ER 50-65 bpm. Seen by Dr Lovena Le and not a candidate for PPM due to co morbidities and not on anticoagulation Post CABG in 2013 Of note CT done this admission shows patent SVG to OM and LIMA to LAD. He is post left nephrectomy wit renal failure and Cr near 3. He has end stage metastatic renal cell Admitted with occluded right main stem bronchus and likely massive right sided malignant effusion To go to IR for drainage. His troponin is elevated at 487 with anterolateral T wave inversions. Currently not complaining of chest pain. He is not a candidate for cath or advanced cardiology w/u or procedures do to co morbidities, renal failure and end stage metastatic renal cancer. Would not admit to telemetry Make comfort care ER indicates DNR not noted in chart. Continued coumadin Rx and anticoagulation is also suspect given his overall clinical status   Jenkins Rouge MD [facility]

## 2020-01-03 NOTE — H&P (Signed)
History and Physical    [name] ZHY:[id number] DOB: Apr 23, 1950 DOA: 01/01/2020  PCP: [name], MD   Patient coming from: Home   Chief Complaint: Fatigue, SOB  HPI: [name] is a 69 y.o. male with medical history significant for ESRD on HD, DM 2, blind in both eyes, hypertension, prostate cancer, metastatic renal cancer, who presents today for evaluation of generally feeling unwell. He reports that he had a wet cough that started Saturday night. He states that at about 3 PM today he suddenly felt very poorly with chills, body aches.He finished dialysis earlier this morning without complication. He had COVID-19 in December 2020, and has had 2 Covid vaccines since that time.  He reports that earlier he had chest pain however now his entire body just hurts.He has been short of breath for about 30 minutes.He states that he has to change what side he is lying on every 3 hours and notes that when he lays with the right side down he is able to breath a little better.   ED Course:  In the ER he is found to have a very large right-sided pleural effusion that is causing mediastinal shift to the left and occlusion of his right mainstem bronchus with partial collapse of his left mainstem bronchus.  He was evaluated by PCCM in the emergency room but did not feel he needed ICU care at this time and they would see patient in the morning and arrange for thoracentesis versus Pleurx catheter placement in the morning.  Review of Systems:  General: Denies weakness, fever, chills, weight loss, night sweats.  Denies dizziness.  Denies change in appetite HENT: Denies head trauma, headache, denies change in hearing, tinnitus.  Denies nasal congestion or bleeding.  Denies sore throat, sores in mouth.  Denies difficulty swallowing Eyes: Denies blurry vision, pain in eye, drainage.  Denies discoloration of eyes. Neck: Denies pain.  Denies swelling.  Denies pain with movement. Cardiovascular:  Denies chest pain, palpitations.  Reports chronic edema.  Denies orthopnea Respiratory: Reports shortness of breath with dry cough.  Denies wheezing.  Denies sputum production Gastrointestinal: Denies abdominal pain, swelling.  Denies nausea, vomiting, diarrhea.  Denies melena.  Denies hematemesis. Musculoskeletal: Denies limitation of movement.  Denies deformity or swelling.  Denies pain.  Denies arthralgias or myalgias. Genitourinary: Denies pelvic pain.  Denies urinary frequency or hesitancy.  Denies dysuria.  Skin: Denies rash.  Denies petechiae, purpura, ecchymosis. Neurological: Denies headache.  Denies syncope.  Denies seizure activity.  Denies weakness or paresthesia.  Denies slurred speech, drooping face.  Denies visual change. Psychiatric: Denies depression, anxiety.  Denies suicidal thoughts or ideation.  Denies hallucinations.  Past Medical History:  Diagnosis Date  . Anemia   . Arthritis    "back, neck" (07/28/2014)  . Blind    both eyes removed   . Bruises easily   . CAD, NATIVE VESSEL 01/08/2008      . Cellulitis late 1980's   "hospitalized; wrapped both legs; several times; no OR for this"  . Colon polyps   . Diabetic neuropathy ([place])   . Dialysis patient ([place])   . DM type 2 (diabetes mellitus, type 2) ([place])    no medications (07/28/2014) diet and excersie controlled not onmeds for 14-15 years   . Dysrhythmia    afib   . ESRD (end stage renal disease) on dialysis [facility])    [name]; Mon, Wed, Fri (07/28/2014)  . Fall    hx of fall and required  left hip surgery   . Family history of breast cancer    mother  . Heart murmur    hx of  . History of blood product transfusion   . HYPERLIPIDEMIA-MIXED 01/08/2008  . HYPERTENSION 01/27/2009   BP low , hx of HTN, Currently on meds to raise BP  . Hypotension   . Kidney stones   . Low blood pressure   . OVERWEIGHT/OBESITY 01/08/2008   Lost 205 lbs through diet and exercise.    . Peripheral vascular disease ([place])    vein  stripping  . Pneumonia 2000;s X 1  . Prostate cancer ([place])   . Prosthetic eye globe    both eyes  . Renal cell carcinoma 2001 and 2003   "both kidneys"  . Renal insufficiency     Past Surgical History:  Procedure Laterality Date  . AV FISTULA PLACEMENT Left 02/2010   LFA  . AV FISTULA PLACEMENT Left 01/12/2016   Procedure: LEFT BRACHIOCEPHALIC ARTERIOVENOUS (AV) FISTULA CREATION;  Surgeon: Angelia Mould, MD;  Location: [place];  Service: Vascular;  Laterality: Left;  . CHOLECYSTECTOMY OPEN  1992  . COLONOSCOPY  06/14/2011   Procedure: COLONOSCOPY;  Surgeon: [name], MD;  Location: [facility];  Service: Endoscopy;  Laterality: N/A;  . COLONOSCOPY N/A 07/24/2012   Procedure: COLONOSCOPY;  Surgeon: [name], MD;  [facility];  Service: Endoscopy;  Laterality: N/A;  . COLONOSCOPY    . COLONOSCOPY N/A 11/04/2015   Procedure: COLONOSCOPY;  Surgeon: Manus Gunning, MD;  Location[facility]COPY;  Service: Gastroenterology;  Laterality: N/A;  . COLONOSCOPY WITH PROPOFOL N/A 05/13/2014   Procedure: COLONOSCOPY WITH PROPOFOL;  Surgeon: [name], MD;  [facility];  Service: Endoscopy;  Laterality: N/A;  . CORONARY ARTERY BYPASS GRAFT  09/29/2011   Procedure: CORONARY ARTERY BYPASS GRAFTING (CABG);  Surgeon: [name], MD;  Location: [place];  Service: Open Heart Surgery;  Laterality: N/A;  Coronary Artery Bypass Graft times two utilizing the left internal mammary artery and the right greater saphenous vein harvested endoscopically.  . CYSTOSCOPY WITH RETROGRADE PYELOGRAM, URETEROSCOPY AND STENT PLACEMENT Left 07/28/2014   Procedure: CYSTOSCOPY WITH RETROGRADE PYELOGRAM, URETERAL BALLOON DILITATION, URETEROSCOPY AND LEFT STENT PLACEMENT;  Surgeon: [name], MD;  [facility];  Service: Urology;  Laterality: Left;  . ENUCLEATION  2003; 2006   bilateral; "diabetes; pain"  . FOOT AMPUTATION Right ~ 2002   right;  partial; "infection"  .  FRACTURE SURGERY     hip fx  . HIP PINNING,CANNULATED Left 12/31/2013   Procedure: CANNULATED HIP PINNING;  Surgeon: Renette Butters, MD;  Location: [place];  Service: Orthopedics;  Laterality: Left;  Carm, FX Table, [employer]  . HOT HEMOSTASIS  06/14/2011   Procedure: HOT HEMOSTASIS (ARGON PLASMA COAGULATION/BICAP);  Surgeon: [name], MD;  Location: [facility];  Service: Endoscopy;  Laterality: N/A;  . INSERTION OF DIALYSIS CATHETER Right 01/12/2016   Procedure: INSERTION OF DIALYSIS CATHETER;  Surgeon: Angelia Mould, MD;  Location: [place];  Service: Vascular;  Laterality: Right;  . INSERTION PROSTATE RADIATION SEED  03/2012  . IR GENERIC HISTORICAL  01/13/2016   IR RADIOLOGIST EVAL & MGMT 01/13/2016 Aletta Edouard, MD [facility] INTERV RAD  . IR RADIOLOGIST EVAL & MGMT  03/02/2017  . IR RADIOLOGIST EVAL & MGMT  05/18/2017  . IR RADIOLOGIST EVAL & MGMT  07/27/2017  . LAPAROTOMY N/A 04/07/2016   Procedure: EXPLORATORY LAPAROTOMY AND RESECTION OF RETROPERITONEAL MASS;  Surgeon: Raynelle Bring, MD;  [facility];  Service: Urology;  Laterality: N/A;  . LEFT HEART CATHETERIZATION WITH CORONARY ANGIOGRAM N/A 09/27/2011   Procedure: LEFT HEART CATHETERIZATION WITH CORONARY ANGIOGRAM;  Surgeon: [name], MD;  [facility] C[facility] LAB;  Service: Cardiovascular;  Laterality: N/A;  . LIGATION OF ARTERIOVENOUS  FISTULA Left 01/12/2016   Procedure: LIGATION OF LEFT RADIOCEPHALIC ARTERIOVENOUS  FISTULA;  Surgeon: Angelia Mould, MD;  Location: [place];  Service: Vascular;  Laterality: Left;  . LIGATION OF COMPETING BRANCHES OF ARTERIOVENOUS FISTULA Left 07/29/2014   Procedure: LIGATION OF COMPETING BRANCHES OF LEFT ARM ARTERIOVENOUS FISTULA;  Surgeon: [name], MD;  Location: [place];  Service: Vascular;  Laterality: Left;  . NEPHRECTOMY Right 01/30/2015   done at [place]  . PARTIAL COLECTOMY Right 04/07/2016   Procedure: RIGHT COLECTOMY AND PARTIAL LIVER RESECTION;  Surgeon:  Raynelle Bring, MD;  [facility];  Service: Urology;  Laterality: Right;  . RADIOFREQUENCY ABLATION N/A 04/19/2017   Procedure: CT MICROWAVE THERMAL ABLATION;  Surgeon: Aletta Edouard, MD;  [facility];  Service: Anesthesiology;  Laterality: N/A;  . RADIOFREQUENCY ABLATION KIDNEY  2010-2012   "twice; one on each side; for cancer"  . REVISON OF ARTERIOVENOUS FISTULA Left 09/01/2015   Procedure: EXPLORATION LEFT LOWER ARM  RADIOCEPHALIC ARTERIOVENOUS FISTULA;  Surgeon: Conrad Pacifica, MD;  Location: [place];  Service: Vascular;  Laterality: Left;  [name] VARICOSE VEIN SURGERY  mid 1980's    BLE; "knees down; both legs; 2 separate times"    Social History  reports that he has never smoked. He has never used smokeless tobacco. He reports that he does not drink alcohol and does not use drugs.  Allergies  Allergen Reactions  . Codeine Other (See Comments)     Cashion  . Tape Other (See Comments)    Plastic tape tears skin off, please use paper tape instead.    Family History  Problem Relation Age of Onset  . Cancer Mother 13       breast, spine and ovarian  . Heart disease Mother   . Hyperlipidemia Mother   . Hypertension Mother   . Varicose Veins Mother   . Emphysema Father   . Cancer Father 37       kidney, prostate  . Heart disease Father   . Hyperlipidemia Father   . Hypertension Father   . Cancer Sister        ovarian  . Cancer Brother        lung  . Heart disease Brother   . Hyperlipidemia Brother   . Hypertension Brother   . Heart attack Brother   . Peripheral vascular disease Brother   . Cancer Other   . Coronary artery disease Other   . Kidney disease Other   . Breast cancer Other   . Ovarian cancer Other   . Lung cancer Other   . Diabetes Other   . Malignant hyperthermia Neg Hx      Prior to Admission medications   Medication Sig Start Date End Date Taking? Authorizing Provider  acetaminophen (TYLENOL) 325 MG tablet Take 2 tablets (650 mg  total) by mouth every 6 (six) hours as needed for mild pain or headache (fever >/= 101). 03/28/19   Pokhrel, Corrie Mckusick, MD  albuterol (VENTOLIN HFA) 108 (90 Base) MCG/ACT inhaler Inhale 2 puffs into the lungs every 6 (six) hours as needed for wheezing or shortness of breath. 03/28/19  Pokhrel, Laxman, MD  Amino Acids-Protein Hydrolys (FEEDING SUPPLEMENT, PRO-STAT SUGAR FREE 64,) LIQD Take 30 mLs by mouth 2 (two) times daily. 03/28/19   Pokhrel, Corrie Mckusick, MD  atorvastatin (LIPITOR) 10 MG tablet Take 10 mg by mouth daily.    [provider]  benzonatate (TESSALON) 100 MG capsule Take 100 mg by mouth every 8 (eight) hours as needed for cough.    [provider]  cyclobenzaprine (FLEXERIL) 10 MG tablet Take 10 mg by mouth every 12 (twelve) hours as needed. Neck pain 09/26/18   [provider]  ferrous sulfate 325 (65 FE) MG tablet Take 325 mg by mouth See admin instructions. 5am on Monday, Wednesday, Friday 8am on Sunday, Tuesday, Thursday, Saturday    [provider]  JANUVIA 25 MG tablet Take 25 mg by mouth See admin instructions. 8am on Sunday, Tuesday, Thursday, Saturday 5am on [place], Wednesday, Friday 03/04/19   [provider]  midodrine (PROAMATINE) 5 MG tablet Take 1 tablet (5 mg total) by mouth 3 (three) times daily with meals. 03/28/19   Pokhrel, Corrie Mckusick, MD  multivitamin (RENA-VIT) TABS tablet Take 1 tablet by mouth daily.    [provider]  Nutritional Supplements (FEEDING SUPPLEMENT, NEPRO CARB STEADY,) LIQD Take 237 mLs by mouth 2 (two) times daily between meals. 03/28/19   Pokhrel, Corrie Mckusick, MD  ondansetron (ZOFRAN) 4 MG tablet Take 1 tablet (4 mg total) by mouth every 6 (six) hours as needed for nausea. 03/28/19   Pokhrel, Corrie Mckusick, MD  Probiotic Product (ALIGN) 4 MG CAPS Take 1 capsule by mouth See admin instructions. Take at 8am on Sunday, Tuesday, Thursday, Saturday Take at 5am on Monday, Wednesday, Friday    [provider]    sevelamer carbonate (RENVELA) 800 MG tablet Take 2,400-4,800 mg by mouth See admin instructions. Take 6 tablets at 8am (4800 mg) by mouth 3 times daily with meals on Tuesday, Thursday, Saturday, Sunday Take 6 tablets at 5am (4800 mg) by mouth 3 times daily with meals on Monday, Wednesday, Friday  Take 3 tablets (2400 mg) 2 times daily with snacks 06/26/14   [provider]  warfarin (COUMADIN) 1 MG tablet Take 1 tablet (1 mg total) by mouth daily. Adjust for INR 2-3, hold for 12/24 and 12/25 and start on 03/30/19 if INR is between 2-3 03/30/19 03/29/20  Flora Lipps, MD    Physical Exam: Vitals:   12-26-2019 0221 12-26-19 0300 December 26, 2019 0344 12/26/19 0500  BP: 91/65 110/67 (!) 95/51 100/76  Pulse: 72 61 62 68  Resp: 20 18 18 18   Temp:      TempSrc:      SpO2: 98% 97% 100% 100%  Weight:      Height:        Constitutional: NAD, calm, comfortable Vitals:   12-26-19 0221 12-26-19 0300 2019/12/26 0344 12/26/19 0500  BP: 91/65 110/67 (!) 95/51 100/76  Pulse: 72 61 62 68  Resp: 20 18 18 18   Temp:      TempSrc:      SpO2: 98% 97% 100% 100%  Weight:      Height:       General: Elderly chronically ill appearing man. Alert. Oriented to person and place.  Eyes: Blind. Cataracts bilaterally HENT:  [place]/AT, external ears normal.  Nares patent without epistasis.  Mucous membranes are moist. Posterior pharynx clear of any exudate or lesions. Neck: Soft, normal range of motion, supple, no masses, no thyromegaly.  Trachea midline Respiratory: Diminished breath sounds on right side. Mild  rales on left side with good air movement. no wheezing, no crackles. Normal respiratory effort. No accessory muscle use.  Cardiovascular: Irregularly irregular rhythm. no murmurs / rubs / gallops. Has +2 lower extremity edema.   Abdomen: Soft, no tenderness, nondistended, no rebound or guarding.  No masses palpated. Bowel sounds normoactive Musculoskeletal: FROM. no clubbing / cyanosis. Normal muscle tone.   Skin: Warm, dry, intact no rashes, lesions, ulcers. No induration Neurologic: CN 2-12 grossly intact.  Normal speech.  Sensation intact   Psychiatric: Normal judgment and insight.  Normal mood.    Labs on Admission: I have personally reviewed following labs and imaging studies  CBC: Recent Labs  Lab 12/26/2019 2014  WBC 7.0  HGB 11.0*  HCT 36.2*  MCV 104.0*  PLT 145*    Basic Metabolic Panel: Recent Labs  Lab 12/30/2019 2041  NA 141  K 3.1*  CL 97*  CO2 32  GLUCOSE 98  BUN 18  CREATININE 2.94*  CALCIUM 9.6  MG 2.1    GFR: Estimated Creatinine Clearance: 31.9 mL/min (A) (by C-G formula based on SCr of 2.94 mg/dL (H)).  Liver Function Tests: Recent Labs  Lab 12/17/2019 2041  AST 16  ALT 12  ALKPHOS 85  BILITOT 0.6  PROT 5.9*  ALBUMIN 2.8*    Urine analysis:    Component Value Date/Time   COLORURINE YELLOW 11/01/2015 1917   APPEARANCEUR CLOUDY (A) 11/01/2015 1917   LABSPEC 1.012 11/01/2015 1917   PHURINE 7.0 11/01/2015 1917   GLUCOSEU NEGATIVE 11/01/2015 1917   HGBUR SMALL (A) 11/01/2015 1917   BILIRUBINUR NEGATIVE 11/01/2015 1917   KETONESUR NEGATIVE 11/01/2015 1917   PROTEINUR 100 (A) 11/01/2015 1917   UROBILINOGEN 0.2 07/28/2014 1007   NITRITE NEGATIVE 11/01/2015 1917   LEUKOCYTESUR LARGE (A) 11/01/2015 1917    Radiological Exams on Admission: CT Angio Chest PE W/Cm &/Or Wo Cm  Result Date: 01-06-2020 CLINICAL DATA:  Metastatic disease evaluation, body aches following dialysis renal cell carcinoma, prostate cancer chest pain short of breath EXAM: CT ANGIOGRAPHY CHEST CT ABDOMEN AND PELVIS WITH CONTRAST TECHNIQUE: Multidetector CT imaging of the chest was performed using the standard protocol during bolus administration of intravenous contrast. Multiplanar CT image reconstructions and MIPs were obtained to evaluate the vascular anatomy. Multidetector CT imaging of the abdomen and pelvis was performed using the standard protocol during bolus  administration of intravenous contrast. CONTRAST:  146mL OMNIPAQUE IOHEXOL 350 MG/ML SOLN COMPARISON:  Chest x-ray 12/07/2019, CT chest abdomen pelvis 06/14/2018, PET CT 07/27/2017, CT chest abdomen and pelvis 07/04/2019 FINDINGS: CTA CHEST FINDINGS Cardiovascular: Satisfactory opacification of the pulmonary arteries to the segmental level. No evidence of pulmonary embolism. Nonaneurysmal aorta. Mild aortic atherosclerosis. Coronary vascular calcifications. Normal cardiac size. No significant pericardial effusion Mediastinum/Nodes: No thyroid mass. Esophagus within normal limits. The distal trachea appears narrowed above the bifurcation. There is filling of the right mainstem bronchus with fluid, debris or soft tissue. There is narrowed appearance of the left bronchus as well. Esophagus is displaced to the left. Possible enlarged subcarinal node though difficult to separate from adjacent collapsed lung. Lungs/Pleura: Massive right pleural effusion with atelectasis of the right. Shift of mediastinal contents to the left. No pneumothorax. Left lower lobe bronchial wall thickening. Musculoskeletal: Soft tissue mass between the right ninth and tenth ribs measures 2.1 x 1.1 cm, previously 2.2 x 1.1 cm. Slight increased size of pleural based mass at the inferior costophrenic recess measuring 4 by 2 cm, previously 1.7 x 1.2 cm. New tenth posterolateral  rib fracture, possibly pathologic. Post sternotomy changes. Diffuse spondylosis of the spine. Review of the MIP images confirms the above findings. CT ABDOMEN and PELVIS FINDINGS Hepatobiliary: Status post cholecystectomy. No biliary dilatation. No focal hepatic abnormality Pancreas: Atrophic.  No inflammatory change. Spleen: Normal in size without focal abnormality. Adrenals/Urinary Tract: Adrenal glands are normal. Status post right nephrectomy. Atrophic left kidney with numerous cortical lesions many of which are subcentimeter. Some of the lesions demonstrate increased  density. No hydronephrosis. Intrarenal vascular calcification. The bladder is unremarkable. Stomach/Bowel: The stomach is nonenlarged. No dilated small bowel. Status post partial right colectomy with unremarkable ileal colic anastomosis in the right abdomen. No acute wall thickening. Vascular/Lymphatic: Extensive vascular calcification without aneurysm. No suspicious adenopathy Reproductive: Multiple prostate seeds. Other: Negative for free air or free fluid. Increased edema within the right flank and lateral abdominal wall Musculoskeletal: Left intramedullary femoral rod. Ankylosis of the spine. No acute osseous abnormality. Review of the MIP images confirms the above findings. IMPRESSION: 1. Negative for acute pulmonary embolus. 2. Massive right pleural effusion with atelectasis of the right lung and shift of mediastinal contents to the left. There right mainstem bronchus is occluded with fluid, debris, or soft tissue and there is significant narrowing of the left mainstem bronchus, also with fluid, tissue or debris in the bronchus. 3. Slight interval increase in size of pleural based mass at the right inferior costophrenic recess. The metastatic lesion between the right ninth and tenth ribs is unchanged in size. New right tenth posterolateral rib fracture, possibly pathologic. 4. Possible subcarinal adenopathy though difficult to separate from adjacent atelectatic lung. Consider chest CT follow-up after resolution of pleural effusion. 5. No CT evidence for acute intra-abdominal or pelvic abnormality. 6. Status post right nephrectomy. Atrophic left kidney with numerous cortical lesions of varying density, grossly unchanged. 7. Increased edema within the right flank and lateral abdominal wall. Aortic Atherosclerosis (ICD10-I70.0). Electronically Signed   By: Donavan Foil M.D.   On: 01-09-20 01:19   CT Abdomen Pelvis W Contrast  Result Date: 01/09/20 CLINICAL DATA:  Metastatic disease evaluation, body aches  following dialysis renal cell carcinoma, prostate cancer chest pain short of breath EXAM: CT ANGIOGRAPHY CHEST CT ABDOMEN AND PELVIS WITH CONTRAST TECHNIQUE: Multidetector CT imaging of the chest was performed using the standard protocol during bolus administration of intravenous contrast. Multiplanar CT image reconstructions and MIPs were obtained to evaluate the vascular anatomy. Multidetector CT imaging of the abdomen and pelvis was performed using the standard protocol during bolus administration of intravenous contrast. CONTRAST:  162mL OMNIPAQUE IOHEXOL 350 MG/ML SOLN COMPARISON:  Chest x-ray 12/20/2019, CT chest abdomen pelvis 06/14/2018, PET CT 07/27/2017, CT chest abdomen and pelvis 07/04/2019 FINDINGS: CTA CHEST FINDINGS Cardiovascular: Satisfactory opacification of the pulmonary arteries to the segmental level. No evidence of pulmonary embolism. Nonaneurysmal aorta. Mild aortic atherosclerosis. Coronary vascular calcifications. Normal cardiac size. No significant pericardial effusion Mediastinum/Nodes: No thyroid mass. Esophagus within normal limits. The distal trachea appears narrowed above the bifurcation. There is filling of the right mainstem bronchus with fluid, debris or soft tissue. There is narrowed appearance of the left bronchus as well. Esophagus is displaced to the left. Possible enlarged subcarinal node though difficult to separate from adjacent collapsed lung. Lungs/Pleura: Massive right pleural effusion with atelectasis of the right. Shift of mediastinal contents to the left. No pneumothorax. Left lower lobe bronchial wall thickening. Musculoskeletal: Soft tissue mass between the right ninth and tenth ribs measures 2.1 x 1.1 cm, previously 2.2 x 1.1 cm.  Slight increased size of pleural based mass at the inferior costophrenic recess measuring 4 by 2 cm, previously 1.7 x 1.2 cm. New tenth posterolateral rib fracture, possibly pathologic. Post sternotomy changes. Diffuse spondylosis of the  spine. Review of the MIP images confirms the above findings. CT ABDOMEN and PELVIS FINDINGS Hepatobiliary: Status post cholecystectomy. No biliary dilatation. No focal hepatic abnormality Pancreas: Atrophic.  No inflammatory change. Spleen: Normal in size without focal abnormality. Adrenals/Urinary Tract: Adrenal glands are normal. Status post right nephrectomy. Atrophic left kidney with numerous cortical lesions many of which are subcentimeter. Some of the lesions demonstrate increased density. No hydronephrosis. Intrarenal vascular calcification. The bladder is unremarkable. Stomach/Bowel: The stomach is nonenlarged. No dilated small bowel. Status post partial right colectomy with unremarkable ileal colic anastomosis in the right abdomen. No acute wall thickening. Vascular/Lymphatic: Extensive vascular calcification without aneurysm. No suspicious adenopathy Reproductive: Multiple prostate seeds. Other: Negative for free air or free fluid. Increased edema within the right flank and lateral abdominal wall Musculoskeletal: Left intramedullary femoral rod. Ankylosis of the spine. No acute osseous abnormality. Review of the MIP images confirms the above findings. IMPRESSION: 1. Negative for acute pulmonary embolus. 2. Massive right pleural effusion with atelectasis of the right lung and shift of mediastinal contents to the left. There right mainstem bronchus is occluded with fluid, debris, or soft tissue and there is significant narrowing of the left mainstem bronchus, also with fluid, tissue or debris in the bronchus. 3. Slight interval increase in size of pleural based mass at the right inferior costophrenic recess. The metastatic lesion between the right ninth and tenth ribs is unchanged in size. New right tenth posterolateral rib fracture, possibly pathologic. 4. Possible subcarinal adenopathy though difficult to separate from adjacent atelectatic lung. Consider chest CT follow-up after resolution of pleural  effusion. 5. No CT evidence for acute intra-abdominal or pelvic abnormality. 6. Status post right nephrectomy. Atrophic left kidney with numerous cortical lesions of varying density, grossly unchanged. 7. Increased edema within the right flank and lateral abdominal wall. Aortic Atherosclerosis (ICD10-I70.0). Electronically Signed   By: Donavan Foil M.D.   On: December 20, 2019 01:19   DG Chest Portable 1 View  Result Date: 12/24/2019 CLINICAL DATA:  Shortness of breath EXAM: PORTABLE CHEST 1 VIEW COMPARISON:  04/07/2019 FINDINGS: 2 frontal views of the chest are obtained. There is complete opacification of the right hemithorax, with slight mediastinal shift to the left. This suggests underlying right pleural effusion as well as lung consolidation. The left chest is clear. No evidence pneumothorax. Stable postsurgical changes from median sternotomy. IMPRESSION: 1. Interval opacification of the right hemithorax consistent with effusion and consolidation. CT chest may be useful for further evaluation. Electronically Signed   By: Randa Ngo M.D.   On: 12/17/2019 20:15    EKG: Independently reviewed. EKG shows sinus arrhythmia with a QTC of 481 with no acute ST elevation or depression.  Assessment/Plan Principal Problem:   Pleural effusion, malignant Mr. Rochford has a large pleural effusion on the right side causing mediastinal shift to the left with occlusion of right mainstem bronchus and partial-occlusion the left mainstem bronchus.  Patient's been evaluated by critical care who will arrange for thoracentesis either with pulmonology or IR this morning and either do a chest tube or a Pleurx catheter after discussion with pulmonology.  Patient does have an underlying metastatic lesion and pathological fracture of his ribs on the right side which would complicate doing a thoracentesis or chest tube blindly at this time.  Active Problems:   Hypoxia Patient is on oxygen by nasal cannula and maintaining O2 sat  greater than 92%.  Continue supplemental oxygen.    Essential hypertension Continue home antihypertensive medications once verified and reconciled by pharmacy    ESRD (end stage renal disease) on dialysis [facility]) Patient had dialysis yesterday morning and will consult with nephrology to arrange for dialysis tomorrow    DM type 2 (diabetes mellitus, type 2) ([place]) Blood sugars with meals and at bedtime.  Check hemoglobin A1c.  Side scale insulin as needed for glycemic control.    Metastatic renal cell carcinoma (HCC) Chronic.  Followed by oncology    Thrombocytopenia ([place])   Atrial fibrillation, chronic (HCC) Chronic conditions.    DVT prophylaxis: Pt is on coumadin but INR is subtherapeutic so will give dose of heparin to bridge and have pharmacy dose coumadin.  Code Status:   DNR. Pt states he does not want intubation or resuscitation. Family Communication:  Diagnosis and plan discussed with the patient.  Disposition Plan:   Patient is from:  Home  Anticipated DC to:  Home  Anticipated DC date:  Today greater than 2 midnight stay in the hospital to treat acute medical condition   Consults called:  PCCM Admission status:  Inpatient  Severity of Illness: The appropriate patient status for this patient is INPATIENT. Inpatient status is judged to be reasonable and necessary in order to provide the required intensity of service to ensure the patient's safety. The patient's presenting symptoms, physical exam findings, and initial radiographic and laboratory data in the context of their chronic comorbidities is felt to place them at high risk for further clinical deterioration. Furthermore, it is not anticipated that the patient will be medically stable for discharge from the hospital within 2 midnights of admission. The following factors support the patient status of inpatient.   * I certify that at the point of admission it is my clinical judgment that the patient will require inpatient  hospital care spanning beyond 2 midnights from the point of admission due to high intensity of service, high risk for further deterioration and high frequency of surveillance required.Yevonne Aline Abbe Bula MD [facility]  How to contact the [facility] Attending or Consulting provider [place] or covering provider during after hours [place], for this patient?   1. Check the care team in [facility] and look for a) attending/consulting [facility] provider listed and b) the [facility] team listed 2. Log into [email or web address] and use [place]'s universal password to access. If you do not have the password, please contact the hospital operator. 3. Locate the [employer] provider you are looking for under [facility] and page to a number that you can be directly reached. 4. If you still have difficulty reaching the provider, please page the [facility] (Director on Call) for the Hospitalists listed on amion for assistance.  12-29-19, 5:51 AM

## 2020-01-03 NOTE — Progress Notes (Signed)
[place] for Warfarin Indication: atrial fibrillation  Allergies  Allergen Reactions  . Codeine Other (See Comments)     [place]  . Tape Other (See Comments)    Plastic tape tears skin off, please use paper tape instead.    Patient Measurements: Height: 6\' 4"  (193 cm) Weight: 110.7 kg (244 lb 0.8 oz) IBW/kg (Calculated) : 86.8  Vital Signs: BP: 80/50 (09/16 1200) Pulse Rate: 63 (09/16 1200)  Labs: Recent Labs    12/17/2019 2014 12/07/2019 2041 12/05/2019 2046 12/09/2019 2328  HGB 11.0*  --   --   --   HCT 36.2*  --   --   --   PLT 145*  --   --   --   LABPROT  --   --  14.0  --   INR  --   --  1.1  --   CREATININE  --  2.94*  --   --   TROPONINIHS  --  483*  --  457*    Estimated Creatinine Clearance: 31.9 mL/min (A) (by C-G formula based on SCr of 2.94 mg/dL (H)).  Assessment: 69 y.o. M presents with fever/chills. Chest CT shows large pleural effusion. A chest tube was placed this morning. Per CCM, warfarin is ok to resume. INR is normal at 1.1.   Unclear if pt is still taking warfarin. INR is normal and I am unable to see any recent pharmacy fills. Pt was in pain and/or confused when I tried to discuss this with him so he was unable to clarify.   Goal of Therapy:  INR 2-3 Monitor platelets by anticoagulation protocol: Yes   Plan:  Warfarin 2mg  PO x 1 tonight Daily INR Clarify home warfarin regimen   Salome Arnt, PharmD, BCPS Clinical Pharmacist Please see AMION for all pharmacy numbers January 15, 2020 12:33 PM

## 2020-01-03 NOTE — Progress Notes (Signed)
Called urgently to evaluate patient  Hypotension Shortness of breath  Decreased air entry worse on the right S1-S2 appreciated Bowel sounds appreciated  Massive right pleural effusion Metabolic encephalopathy Hypotension Atrial fibrillation End-stage renal disease   Decision made to place a pigtail catheter to drain fluid slowly  Sherrilyn Rist, MD [place] PCCM Pager: [phone]

## 2020-01-03 NOTE — Procedures (Signed)
Insertion of Chest Tube Procedure Note  [name]  [id number]  Jun 21, 1950  Date:January 16, 2020  Time:9:32 AM    Provider Performing: [name]   Procedure: Chest Tube Insertion (99774)  Indication(s) Effusion  Consent Unable to obtain consent due to emergent nature of procedure.  Anesthesia Topical only with 1% lidocaine    Time Out Verified patient identification, verified procedure, site/side was marked, verified correct patient position, special equipment/implants available, medications/allergies/relevant history reviewed, required imaging and test results available.   Sterile Technique Maximal sterile technique including full sterile barrier drape, hand hygiene, sterile gown, sterile gloves, mask, hair covering, sterile ultrasound probe cover (if used).   Procedure Description Ultrasound used to identify appropriate pleural anatomy for placement and overlying skin marked. Area of placement cleaned and draped in sterile fashion.  A 14 French pigtail pleural catheter was placed into the right pleural space using Seldinger technique. Appropriate return of fluid was obtained.  The tube was connected to atrium and placed on -20 cm H2O wall suction.   Complications/Tolerance None; patient tolerated the procedure well. Chest X-ray is ordered to verify placement.   EBL none  Specimen(s) fluid  About 500 cc of fluid drained Fluid sent for analysis  Nicholas Rist, MD [place] PCCM Pager: [phone]

## 2020-01-03 NOTE — ED Provider Notes (Signed)
"[name] is a 69 y.o. male with past medical history of ESRD on HD, DM 2, blind with both eyes removed, hypertension, prostate cancer, metastatic renal cancer, who presents today for evaluation of generally feeling unwell.  He reports that he had a wet cough that started Saturday night.  He states that at about 3 PM today he suddenly felt very poorly with chills, body aches.  He finished dialysis earlier this morning.  He had COVID-19 in December 2020, and has had 2 Covid vaccines since.  He reports that earlier he had chest pain however now his entire body just hurts.  He has been short of breath for about 30 minutes.  He states that he has to change what side he is lying on every 3 hours and notes that when he lays with the right side day when it hurts.  This is not new.  No aggravating or alleviating factors noted."  Physical Exam  BP 100/76 (BP Location: Right Arm)   Pulse 68   Temp (!) 96.3 F (35.7 C) (Rectal)   Resp 18   Ht 6\' 4"  (1.93 m)   Wt 110.7 kg   SpO2 100%   BMI 29.71 kg/m   Physical Exam Vitals and nursing note reviewed.  Constitutional:      General: He is not in acute distress.    Appearance: He is not ill-appearing or toxic-appearing.     Comments: Chronically ill-appearing  Pulmonary:     Effort: Pulmonary effort is normal.     Comments: Rhonchorous sounding cough.  Lung sounds are significantly diminished on the right.    ED Course/Procedures   Clinical Course as of Dec 19 534  Wed Dec 18, 2019  2151 Noted, will obtain delta.  Given his ESRD unsure what baseline is for him as no prior in the system.   Troponin I (High Sensitivity)(!!): 483 [EH]  2152 Concern for right sided malignant pleural effusion.  Given his borderline hypotension here will obtain CTA chest and CT abdomen for further evaluation.   DG Chest Portable 1 View [EH]  2308 Patient reports that he would not want to be put on a ventilator, or have CPR.    [EH]    Clinical  Course User Index [EH] Lorin Glass, PA-C    .Critical Care Performed by: Joanne Gavel, PA-C Authorized by: Joanne Gavel, PA-C   Critical care provider statement:    Critical care time (minutes):  45   Critical care time was exclusive of:  Separately billable procedures and treating other patients and teaching time   Critical care was necessary to treat or prevent imminent or life-threatening deterioration of the following conditions:  Respiratory failure   Critical care was time spent personally by me on the following activities:  Ordering and performing treatments and interventions, ordering and review of laboratory studies, ordering and review of radiographic studies, pulse oximetry, re-evaluation of patient's condition, review of old charts, obtaining history from patient or surrogate, examination of patient, evaluation of patient's response to treatment and development of treatment plan with patient or surrogate   I assumed direction of critical care for this patient from another provider in my specialty: no      MDM  69 year old male received a signout from [facility] pending CT.  Please see her note for further work-up and medical decision making.  CT with massive right pleural effusion of the right lung and mediastinal shift to the left.  There right  mainstem bronchus is occluded with fluid, debris's, or soft tissue and there is significant narrowing of the left mainstem bronchus also with tissue or debris.  There is an interval increase in size of the mass at the right inferior costophrenic recess and the metastatic lesion between the right ninth and 10th ribs is unchanged.  There is a new right 10th posterior lateral rib fracture that is possibly pathologic.  The patient was discussed with Dr. Betsey Holiday, attending physician.  On my evaluation, patient is on 4 L nasal cannula, up from 2 on arrival to the ER.  Patient is on room air at baseline.  Pressures are soft, but this  is the patient's baseline.  He is in no acute distress.  He is chronically ill-appearing.  Consulted pulmonary critical care and spoke with Dr. Earlie Server.  Dr. Earlie Server does not feel that emergent thoracentesis or chest tube is indicated at this time as the patient is in no acute distress.  Recommends plan for draining the effusion today thoracentesis versus Pleurx cath placement.  Patient wants to be a DNR/DNI.  Attempted to contact the patient's son [name] unsuccessfully.  Consulted the hospitalist team and Dr. Tonie Griffith will accept the patient for admission. The patient appears reasonably stabilized for admission considering the current resources, flow, and capabilities available in the ED at this time, and I doubt any other [facility] requiring further screening and/or treatment in the ED prior to admission.     Joline Maxcy A, PA-C 2020-01-12 0536    Orpah Greek, MD 12/20/19 [phone]

## 2020-01-03 NOTE — Progress Notes (Addendum)
CSW spoke with Hassan Rowan [phone]) from [facility] regarding this patient per a request from the patient's son Mitzi Hansen. CSW provided a brief update about the plan for medical admission. Hassan Rowan states the patient's son Mitzi Hansen resides in [place] but is reachable by telephone. Hassan Rowan available for discharge planning support if necessary.  Madilyn Fireman, MSW, LCSW-A Transitions of Care  Clinical Social Worker  [facility] Emergency Departments  Medical ICU [phone]

## 2020-01-03 NOTE — Progress Notes (Signed)
This chaplain responded and joined RN-Clark in contacting and offering a pastoral presence to the family after the  Pt. death.  The RN returned the Pt. son-[name] phone call. [name] accepted spiritual care from the chaplain as he shared the Pt. medical journey, family story, and the roles of the Pt. two sons. [name] communicated disposition of the Pt. body will be to [facility] on [address] If needed [name] will be the Pt. family contact. [name] requested the Pt. personal belongings(pillow and jacket) be discarded.  The Pt. Cell phone is to be donated to social work.

## 2020-01-03 NOTE — ED Notes (Signed)
Son, Mitzi Hansen would like an update [phone]

## 2020-01-03 NOTE — Consult Note (Signed)
[facility] Renal Consultation Note    Indication for Consultation:  Management of ESRD/hemodialysis, anemia, hypertension/volume, and secondary hyperparathyroidism.  HPI: [name] is a 69 y.o. male with extensive past medical history including ESRD on dialysis Monday Wednesday Friday, type 2 diabetes, CAD, blindness, prostate cancer, and bilateral renal cell carcinoma who presented to the ED on 12/17/2019 with body aches and shortness of breath/R sided chest pain. CT of the chest showed "massive R pleural effusion and mediastinal shift to the left." R mainstem bronchus occluded. New R 10th rib fracture. K+ 3.1, troponin 483 > 457. Patient developed worsening shortness of breath and hypotension. Critical care was consulted for urgent thoracentesis vs. Chest tube and placed a R pigtail catheter. Per RN, has already drained 1.1L. Nephrology consulted for management of ESRD during admission.  Patient dialyzes on MWF schedule, last HD was 9/15/21and it appears he has hypotension during HD regularly. He is compliant with his hemodialysis. On evaluation, patient is alert but chronically ill appearing and coughing. ROS limited but he does nod "yes" to shortness of breath and chest pain. BP in the 45'G systolic at time of exam.   Past Medical History:  Diagnosis Date   Anemia    Arthritis    "back, neck" (07/28/2014)   Blind    both eyes removed    Bruises easily    CAD, NATIVE VESSEL 01/08/2008       Cellulitis late 1980's   "hospitalized; wrapped both legs; several times; no OR for this"   Colon polyps    Diabetic neuropathy ([place])    Dialysis patient ([place])    DM type 2 (diabetes mellitus, type 2) ([place])    no medications (07/28/2014) diet and excersie controlled not onmeds for 14-15 years    Dysrhythmia    afib    ESRD (end stage renal disease) on dialysis [facility])    [employer]; Mon, Wed, Fri (07/28/2014)   Fall    hx of fall and required left hip surgery     Family history of breast cancer    mother   Heart murmur    hx of   History of blood product transfusion    HYPERLIPIDEMIA-MIXED 01/08/2008   HYPERTENSION 01/27/2009   BP low , hx of HTN, Currently on meds to raise BP   Hypotension    Kidney stones    Low blood pressure    OVERWEIGHT/OBESITY 01/08/2008   Lost 205 lbs through diet and exercise.     Peripheral vascular disease ([place])    vein stripping   Pneumonia 2000;s X 1   Prostate cancer ([place])    Prosthetic eye globe    both eyes   Renal cell carcinoma 2001 and 2003   "both kidneys"   Renal insufficiency    Past Surgical History:  Procedure Laterality Date   AV FISTULA PLACEMENT Left 02/2010   LFA   AV FISTULA PLACEMENT Left 01/12/2016   Procedure: LEFT BRACHIOCEPHALIC ARTERIOVENOUS (AV) FISTULA CREATION;  Surgeon: Angelia Mould, MD;  Location: [place];  Service: Vascular;  Laterality: Left;   [place]   COLONOSCOPY  06/14/2011   Procedure: COLONOSCOPY;  Surgeon: [name], MD;  [facility];  Service: Endoscopy;  Laterality: N/A;   COLONOSCOPY N/A 07/24/2012   Procedure: COLONOSCOPY;  Surgeon: [name], MD;  [facility];  Service: Endoscopy;  Laterality: N/A;   COLONOSCOPY     COLONOSCOPY N/A 11/04/2015   Procedure: COLONOSCOPY;  Surgeon:  Manus Gunning, MD;  Location: [place];  Service: Gastroenterology;  Laterality: N/A;   COLONOSCOPY WITH PROPOFOL N/A 05/13/2014   Procedure: COLONOSCOPY WITH PROPOFOL;  Surgeon: [name], MD;  [facility];  Service: Endoscopy;  Laterality: N/A;   CORONARY ARTERY BYPASS GRAFT  09/29/2011   Procedure: CORONARY ARTERY BYPASS GRAFTING (CABG);  Surgeon: [name], MD;  Location: [place];  Service: Open Heart Surgery;  Laterality: N/A;  Coronary Artery Bypass Graft times two utilizing the left internal mammary artery and the right greater saphenous vein harvested endoscopically.    CYSTOSCOPY WITH RETROGRADE PYELOGRAM, URETEROSCOPY AND STENT PLACEMENT Left 07/28/2014   Procedure: CYSTOSCOPY WITH RETROGRADE PYELOGRAM, URETERAL BALLOON DILITATION, URETEROSCOPY AND LEFT STENT PLACEMENT;  Surgeon: [name], MD;  [facility];  Service: Urology;  Laterality: Left;   ENUCLEATION  2003; 2006   bilateral; "diabetes; pain"   FOOT AMPUTATION Right ~ 2002   right;  partial; "infection"   FRACTURE SURGERY     hip fx   HIP PINNING,CANNULATED Left 12/31/2013   Procedure: CANNULATED HIP PINNING;  Surgeon: Renette Butters, MD;  Location: [place];  Service: Orthopedics;  Laterality: Left;  [place], [employer], [employer]   HOT HEMOSTASIS  06/14/2011   Procedure: HOT HEMOSTASIS (ARGON PLASMA COAGULATION/BICAP);  Surgeon: [name], MD;  Location[facility];  Service: Endoscopy;  Laterality: N/A;   INSERTION OF DIALYSIS CATHETER Right 01/12/2016   Procedure: INSERTION OF DIALYSIS CATHETER;  Surgeon: Angelia Mould, MD;  Location: [place];  Service: Vascular;  Laterality: Right;   INSERTION PROSTATE RADIATION SEED  03/2012   IR GENERIC HISTORICAL  01/13/2016   IR RADIOLOGIST EVAL & MGMT 01/13/2016 Aletta Edouard, MD [facility] INTERV RAD   IR RADIOLOGIST EVAL & MGMT  03/02/2017   IR RADIOLOGIST EVAL & MGMT  05/18/2017   IR RADIOLOGIST EVAL & MGMT  07/27/2017   LAPAROTOMY N/A 04/07/2016   Procedure: EXPLORATORY LAPAROTOMY AND RESECTION OF RETROPERITONEAL MASS;  Surgeon: Raynelle Bring, MD;  [facility];  Service: Urology;  Laterality: N/A;   LEFT HEART CATHETERIZATION WITH CORONARY ANGIOGRAM N/A 09/27/2011   Procedure: LEFT HEART CATHETERIZATION WITH CORONARY ANGIOGRAM;  Surgeon: [name], MD;  Location: [facility];  Service: Cardiovascular;  Laterality: N/A;   LIGATION OF ARTERIOVENOUS  FISTULA Left 01/12/2016   Procedure: LIGATION OF LEFT RADIOCEPHALIC ARTERIOVENOUS  FISTULA;  Surgeon: Angelia Mould, MD;  Location: [place];  Service: Vascular;  Laterality:  Left;   LIGATION OF COMPETING BRANCHES OF ARTERIOVENOUS FISTULA Left 07/29/2014   Procedure: LIGATION OF COMPETING BRANCHES OF LEFT ARM ARTERIOVENOUS FISTULA;  Surgeon: [name], MD;  Location: [place];  Service: Vascular;  Laterality: Left;   NEPHRECTOMY Right 01/30/2015   done at [place] Right 04/07/2016   Procedure: RIGHT COLECTOMY AND PARTIAL LIVER RESECTION;  Surgeon: Raynelle Bring, MD;  [facility];  Service: Urology;  Laterality: Right;   RADIOFREQUENCY ABLATION N/A 04/19/2017   Procedure: CT MICROWAVE THERMAL ABLATION;  Surgeon: Aletta Edouard, MD;  [facility];  Service: Anesthesiology;  Laterality: N/A;   RADIOFREQUENCY ABLATION KIDNEY  2010-2012   "twice; one on each side; for cancer"   [place] Left 09/01/2015   Procedure: EXPLORATION LEFT LOWER ARM  RADIOCEPHALIC ARTERIOVENOUS FISTULA;  Surgeon: Conrad Topaz, MD;  Location: [place];  Service: Vascular;  Laterality: Left;   VARICOSE VEIN SURGERY  mid 1980's    BLE; "knees down;  both legs; 2 separate times"   Family History  Problem Relation Age of Onset   Cancer Mother 87       breast, spine and ovarian   Heart disease Mother    Hyperlipidemia Mother    Hypertension Mother    Varicose Veins Mother    Emphysema Father    Cancer Father 65       kidney, prostate   Heart disease Father    Hyperlipidemia Father    Hypertension Father    Cancer Sister        ovarian   Cancer Brother        lung   Heart disease Brother    Hyperlipidemia Brother    Hypertension Brother    Heart attack Brother    Peripheral vascular disease Brother    Cancer Other    Coronary artery disease Other    Kidney disease Other    Breast cancer Other    Ovarian cancer Other    Lung cancer Other    Diabetes Other    Malignant hyperthermia Neg Hx    Social History:  reports that he has never smoked. He has never used smokeless tobacco. He reports that  he does not drink alcohol and does not use drugs.  ROS: As per HPI otherwise negative.  Physical Exam: Vitals:   Jan 08, 2020 0630 01-08-2020 0730 01/08/2020 0800 01/08/20 0900  BP: (!) 101/59 97/63 (!) 68/43 (!) 71/47  Pulse: 92 (!) 55 72 71  Resp: 20     Temp:      TempSrc:      SpO2: 100% 99% 96% 98%  Weight:      Height:         General: Chronically ill appearing male, coughing frequently Head:  mucus membranes are moist. Neck: JVD not elevated. Lungs: Pigtail catheter R chest. Frequent cough, O2 sat stable in 90's. Diminished breath sounds throughout R lung fields. CTA on the left.  Heart: RRR with normal S1, S2. No murmurs, rubs, or gallops appreciated. Abdomen: Soft, non-tender, non-distended with normoactive bowel sounds. No obvious abdominal masses Lower extremities: No edema bilateral lower extremities  Neuro: Alert and oriented X 3. Moves all extremities spontaneously. Psych:  Responds to questions appropriately with a normal affect. Dialysis Access: LUE AVF + bruit  Allergies  Allergen Reactions   Codeine Other (See Comments)     STATES MAKES HIM COMATOSE   Tape Other (See Comments)    Plastic tape tears skin off, please use paper tape instead.   Prior to Admission medications   Medication Sig Start Date End Date Taking? Authorizing Provider  acetaminophen (TYLENOL) 325 MG tablet Take 2 tablets (650 mg total) by mouth every 6 (six) hours as needed for mild pain or headache (fever >/= 101). 03/28/19   Pokhrel, Corrie Mckusick, MD  albuterol (VENTOLIN HFA) 108 (90 Base) MCG/ACT inhaler Inhale 2 puffs into the lungs every 6 (six) hours as needed for wheezing or shortness of breath. 03/28/19   Pokhrel, Corrie Mckusick, MD  Amino Acids-Protein Hydrolys (FEEDING SUPPLEMENT, PRO-STAT SUGAR FREE 64,) LIQD Take 30 mLs by mouth 2 (two) times daily. 03/28/19   Pokhrel, Corrie Mckusick, MD  atorvastatin (LIPITOR) 10 MG tablet Take 10 mg by mouth daily.    [provider]  benzonatate (TESSALON) 100  MG capsule Take 100 mg by mouth every 8 (eight) hours as needed for cough.    [provider]  cyclobenzaprine (FLEXERIL) 10 MG tablet Take 10 mg by mouth every 12 (twelve)  hours as needed. Neck pain 09/26/18   [provider]  ferrous sulfate 325 (65 FE) MG tablet Take 325 mg by mouth See admin instructions. 5am on Monday, Wednesday, Friday 8am on Sunday, Tuesday, Thursday, Saturday    [provider]  JANUVIA 25 MG tablet Take 25 mg by mouth See admin instructions. 8am on Sunday, Tuesday, Thursday, Saturday 5am on [place], Wednesday, Friday 03/04/19   [provider]  midodrine (PROAMATINE) 5 MG tablet Take 1 tablet (5 mg total) by mouth 3 (three) times daily with meals. 03/28/19   Pokhrel, Corrie Mckusick, MD  multivitamin (RENA-VIT) TABS tablet Take 1 tablet by mouth daily.    [provider]  Nutritional Supplements (FEEDING SUPPLEMENT, NEPRO CARB STEADY,) LIQD Take 237 mLs by mouth 2 (two) times daily between meals. 03/28/19   Pokhrel, Corrie Mckusick, MD  ondansetron (ZOFRAN) 4 MG tablet Take 1 tablet (4 mg total) by mouth every 6 (six) hours as needed for nausea. 03/28/19   Pokhrel, Corrie Mckusick, MD  Probiotic Product (ALIGN) 4 MG CAPS Take 1 capsule by mouth See admin instructions. Take at 8am on Sunday, Tuesday, Thursday, Saturday Take at 5am on Monday, Wednesday, Friday    [provider]  sevelamer carbonate (RENVELA) 800 MG tablet Take 2,400-4,800 mg by mouth See admin instructions. Take 6 tablets at 8am (4800 mg) by mouth 3 times daily with meals on Tuesday, Thursday, Saturday, Sunday Take 6 tablets at 5am (4800 mg) by mouth 3 times daily with meals on Monday, Wednesday, Friday  Take 3 tablets (2400 mg) 2 times daily with snacks 06/26/14   [provider]  warfarin (COUMADIN) 1 MG tablet Take 1 tablet (1 mg total) by mouth daily. Adjust for INR 2-3, hold for 12/24 and 12/25 and start on 03/30/19 if INR is between 2-3 03/30/19 03/29/20  Flora Lipps, MD   Current Facility-Administered Medications  Medication Dose Route Frequency Provider Last Rate Last Admin   benzonatate (TESSALON) capsule 100 mg  100 mg Oral Q8H PRN Kc, Ramesh, MD       cyclobenzaprine (FLEXERIL) tablet 10 mg  10 mg Oral Q12H PRN Kc, Ramesh, MD       feeding supplement (NEPRO CARB STEADY) liquid 237 mL  237 mL Oral BID BM Kc, Ramesh, MD       feeding supplement (PRO-STAT SUGAR FREE 64) liquid 30 mL  30 mL Oral BID Kc, Ramesh, MD       lidocaine (PF) (XYLOCAINE) 1 % injection 15 mL  15 mL Intradermal Once Kc, Ramesh, MD       linagliptin (TRADJENTA) tablet 5 mg  5 mg Oral Daily Kc, Ramesh, MD       midodrine (PROAMATINE) tablet 5 mg  5 mg Oral TID WC Kc, Ramesh, MD       sodium chloride flush (NS) 0.9 % injection 10 mL  10 mL Intracatheter Q8H Bowser, Laurel Dimmer, NP       Warfarin - Pharmacist Dosing Inpatient   Does not apply q1600 Franky Macho, [facility]       Current Outpatient Medications  Medication Sig Dispense Refill   acetaminophen (TYLENOL) 325 MG tablet Take 2 tablets (650 mg total) by mouth every 6 (six) hours as needed for mild pain or headache (fever >/= 101).     albuterol (VENTOLIN HFA) 108 (90 Base) MCG/ACT inhaler Inhale 2 puffs into the lungs every 6 (six) hours as needed for wheezing or shortness of breath.     Amino Acids-Protein Hydrolys (FEEDING  SUPPLEMENT, PRO-STAT SUGAR FREE 64,) LIQD Take 30 mLs by mouth 2 (two) times daily. 887 mL 0   atorvastatin (LIPITOR) 10 MG tablet Take 10 mg by mouth daily.     benzonatate (TESSALON) 100 MG capsule Take 100 mg by mouth every 8 (eight) hours as needed for cough.     cyclobenzaprine (FLEXERIL) 10 MG tablet Take 10 mg by mouth every 12 (twelve) hours as needed. Neck pain     ferrous sulfate 325 (65 FE) MG tablet Take 325 mg by mouth See admin instructions. 5am on Monday, Wednesday, Friday 8am on Sunday, Tuesday, Thursday, Saturday     JANUVIA 25 MG tablet Take 25 mg by mouth See admin  instructions. 8am on Sunday, Tuesday, Thursday, Saturday 5am on [place], Wednesday, Friday     midodrine (PROAMATINE) 5 MG tablet Take 1 tablet (5 mg total) by mouth 3 (three) times daily with meals.     multivitamin (RENA-VIT) TABS tablet Take 1 tablet by mouth daily.     Nutritional Supplements (FEEDING SUPPLEMENT, NEPRO CARB STEADY,) LIQD Take 237 mLs by mouth 2 (two) times daily between meals.  0   ondansetron (ZOFRAN) 4 MG tablet Take 1 tablet (4 mg total) by mouth every 6 (six) hours as needed for nausea. 20 tablet 0   Probiotic Product (ALIGN) 4 MG CAPS Take 1 capsule by mouth See admin instructions. Take at 8am on Sunday, Tuesday, Thursday, Saturday Take at 5am on Monday, Wednesday, Friday     sevelamer carbonate (RENVELA) 800 MG tablet Take 2,400-4,800 mg by mouth See admin instructions. Take 6 tablets at 8am (4800 mg) by mouth 3 times daily with meals on Tuesday, Thursday, Saturday, Sunday Take 6 tablets at 5am (4800 mg) by mouth 3 times daily with meals on Monday, Wednesday, Friday  Take 3 tablets (2400 mg) 2 times daily with snacks     warfarin (COUMADIN) 1 MG tablet Take 1 tablet (1 mg total) by mouth daily. Adjust for INR 2-3, hold for 12/24 and 12/25 and start on 03/30/19 if INR is between 2-3     Labs: Basic Metabolic Panel: Recent Labs  Lab 12/29/2019 2041  NA 141  K 3.1*  CL 97*  CO2 32  GLUCOSE 98  BUN 18  CREATININE 2.94*  CALCIUM 9.6   Liver Function Tests: Recent Labs  Lab 12/25/2019 2041  AST 16  ALT 12  ALKPHOS 85  BILITOT 0.6  PROT 5.9*  ALBUMIN 2.8*   CBC: Recent Labs  Lab 12/18/2019 2014  WBC 7.0  HGB 11.0*  HCT 36.2*  MCV 104.0*  PLT 145*   Studies/Results: CT Angio Chest PE W/Cm &/Or Wo Cm  Result Date: 09-Jan-2020 CLINICAL DATA:  Metastatic disease evaluation, body aches following dialysis renal cell carcinoma, prostate cancer chest pain short of breath EXAM: CT ANGIOGRAPHY CHEST CT ABDOMEN AND PELVIS WITH CONTRAST TECHNIQUE:  Multidetector CT imaging of the chest was performed using the standard protocol during bolus administration of intravenous contrast. Multiplanar CT image reconstructions and MIPs were obtained to evaluate the vascular anatomy. Multidetector CT imaging of the abdomen and pelvis was performed using the standard protocol during bolus administration of intravenous contrast. CONTRAST:  196mL OMNIPAQUE IOHEXOL 350 MG/ML SOLN COMPARISON:  Chest x-ray 12/18/2019, CT chest abdomen pelvis 06/14/2018, PET CT 07/27/2017, CT chest abdomen and pelvis 07/04/2019 FINDINGS: CTA CHEST FINDINGS Cardiovascular: Satisfactory opacification of the pulmonary arteries to the segmental level. No evidence of pulmonary embolism. Nonaneurysmal aorta. Mild aortic atherosclerosis. Coronary vascular calcifications. Normal cardiac  size. No significant pericardial effusion Mediastinum/Nodes: No thyroid mass. Esophagus within normal limits. The distal trachea appears narrowed above the bifurcation. There is filling of the right mainstem bronchus with fluid, debris or soft tissue. There is narrowed appearance of the left bronchus as well. Esophagus is displaced to the left. Possible enlarged subcarinal node though difficult to separate from adjacent collapsed lung. Lungs/Pleura: Massive right pleural effusion with atelectasis of the right. Shift of mediastinal contents to the left. No pneumothorax. Left lower lobe bronchial wall thickening. Musculoskeletal: Soft tissue mass between the right ninth and tenth ribs measures 2.1 x 1.1 cm, previously 2.2 x 1.1 cm. Slight increased size of pleural based mass at the inferior costophrenic recess measuring 4 by 2 cm, previously 1.7 x 1.2 cm. New tenth posterolateral rib fracture, possibly pathologic. Post sternotomy changes. Diffuse spondylosis of the spine. Review of the MIP images confirms the above findings. CT ABDOMEN and PELVIS FINDINGS Hepatobiliary: Status post cholecystectomy. No biliary dilatation. No  focal hepatic abnormality Pancreas: Atrophic.  No inflammatory change. Spleen: Normal in size without focal abnormality. Adrenals/Urinary Tract: Adrenal glands are normal. Status post right nephrectomy. Atrophic left kidney with numerous cortical lesions many of which are subcentimeter. Some of the lesions demonstrate increased density. No hydronephrosis. Intrarenal vascular calcification. The bladder is unremarkable. Stomach/Bowel: The stomach is nonenlarged. No dilated small bowel. Status post partial right colectomy with unremarkable ileal colic anastomosis in the right abdomen. No acute wall thickening. Vascular/Lymphatic: Extensive vascular calcification without aneurysm. No suspicious adenopathy Reproductive: Multiple prostate seeds. Other: Negative for free air or free fluid. Increased edema within the right flank and lateral abdominal wall Musculoskeletal: Left intramedullary femoral rod. Ankylosis of the spine. No acute osseous abnormality. Review of the MIP images confirms the above findings. IMPRESSION: 1. Negative for acute pulmonary embolus. 2. Massive right pleural effusion with atelectasis of the right lung and shift of mediastinal contents to the left. There right mainstem bronchus is occluded with fluid, debris, or soft tissue and there is significant narrowing of the left mainstem bronchus, also with fluid, tissue or debris in the bronchus. 3. Slight interval increase in size of pleural based mass at the right inferior costophrenic recess. The metastatic lesion between the right ninth and tenth ribs is unchanged in size. New right tenth posterolateral rib fracture, possibly pathologic. 4. Possible subcarinal adenopathy though difficult to separate from adjacent atelectatic lung. Consider chest CT follow-up after resolution of pleural effusion. 5. No CT evidence for acute intra-abdominal or pelvic abnormality. 6. Status post right nephrectomy. Atrophic left kidney with numerous cortical lesions of  varying density, grossly unchanged. 7. Increased edema within the right flank and lateral abdominal wall. Aortic Atherosclerosis (ICD10-I70.0). Electronically Signed   By: Donavan Foil M.D.   On: 01-11-2020 01:19   CT Abdomen Pelvis W Contrast  Result Date: 01/11/20 CLINICAL DATA:  Metastatic disease evaluation, body aches following dialysis renal cell carcinoma, prostate cancer chest pain short of breath EXAM: CT ANGIOGRAPHY CHEST CT ABDOMEN AND PELVIS WITH CONTRAST TECHNIQUE: Multidetector CT imaging of the chest was performed using the standard protocol during bolus administration of intravenous contrast. Multiplanar CT image reconstructions and MIPs were obtained to evaluate the vascular anatomy. Multidetector CT imaging of the abdomen and pelvis was performed using the standard protocol during bolus administration of intravenous contrast. CONTRAST:  111mL OMNIPAQUE IOHEXOL 350 MG/ML SOLN COMPARISON:  Chest x-ray 12/29/2019, CT chest abdomen pelvis 06/14/2018, PET CT 07/27/2017, CT chest abdomen and pelvis 07/04/2019 FINDINGS: CTA CHEST FINDINGS Cardiovascular:  Satisfactory opacification of the pulmonary arteries to the segmental level. No evidence of pulmonary embolism. Nonaneurysmal aorta. Mild aortic atherosclerosis. Coronary vascular calcifications. Normal cardiac size. No significant pericardial effusion Mediastinum/Nodes: No thyroid mass. Esophagus within normal limits. The distal trachea appears narrowed above the bifurcation. There is filling of the right mainstem bronchus with fluid, debris or soft tissue. There is narrowed appearance of the left bronchus as well. Esophagus is displaced to the left. Possible enlarged subcarinal node though difficult to separate from adjacent collapsed lung. Lungs/Pleura: Massive right pleural effusion with atelectasis of the right. Shift of mediastinal contents to the left. No pneumothorax. Left lower lobe bronchial wall thickening. Musculoskeletal: Soft tissue  mass between the right ninth and tenth ribs measures 2.1 x 1.1 cm, previously 2.2 x 1.1 cm. Slight increased size of pleural based mass at the inferior costophrenic recess measuring 4 by 2 cm, previously 1.7 x 1.2 cm. New tenth posterolateral rib fracture, possibly pathologic. Post sternotomy changes. Diffuse spondylosis of the spine. Review of the MIP images confirms the above findings. CT ABDOMEN and PELVIS FINDINGS Hepatobiliary: Status post cholecystectomy. No biliary dilatation. No focal hepatic abnormality Pancreas: Atrophic.  No inflammatory change. Spleen: Normal in size without focal abnormality. Adrenals/Urinary Tract: Adrenal glands are normal. Status post right nephrectomy. Atrophic left kidney with numerous cortical lesions many of which are subcentimeter. Some of the lesions demonstrate increased density. No hydronephrosis. Intrarenal vascular calcification. The bladder is unremarkable. Stomach/Bowel: The stomach is nonenlarged. No dilated small bowel. Status post partial right colectomy with unremarkable ileal colic anastomosis in the right abdomen. No acute wall thickening. Vascular/Lymphatic: Extensive vascular calcification without aneurysm. No suspicious adenopathy Reproductive: Multiple prostate seeds. Other: Negative for free air or free fluid. Increased edema within the right flank and lateral abdominal wall Musculoskeletal: Left intramedullary femoral rod. Ankylosis of the spine. No acute osseous abnormality. Review of the MIP images confirms the above findings. IMPRESSION: 1. Negative for acute pulmonary embolus. 2. Massive right pleural effusion with atelectasis of the right lung and shift of mediastinal contents to the left. There right mainstem bronchus is occluded with fluid, debris, or soft tissue and there is significant narrowing of the left mainstem bronchus, also with fluid, tissue or debris in the bronchus. 3. Slight interval increase in size of pleural based mass at the right  inferior costophrenic recess. The metastatic lesion between the right ninth and tenth ribs is unchanged in size. New right tenth posterolateral rib fracture, possibly pathologic. 4. Possible subcarinal adenopathy though difficult to separate from adjacent atelectatic lung. Consider chest CT follow-up after resolution of pleural effusion. 5. No CT evidence for acute intra-abdominal or pelvic abnormality. 6. Status post right nephrectomy. Atrophic left kidney with numerous cortical lesions of varying density, grossly unchanged. 7. Increased edema within the right flank and lateral abdominal wall. Aortic Atherosclerosis (ICD10-I70.0). Electronically Signed   By: Donavan Foil M.D.   On: 12/28/2019 01:19   DG CHEST PORT 1 VIEW  Result Date: 2019-12-28 CLINICAL DATA:  RIGHT chest tube placement EXAM: PORTABLE CHEST 1 VIEW COMPARISON:  September fifteenth 2021, December 28, 2019 FINDINGS: The cardiomediastinal silhouette is unchanged in contour and is partially obscured. Status post median sternotomy.Interval placement of a RIGHT-sided pigtail catheter. Revisualization of a large RIGHT pleural effusion with complete opacification of the RIGHT lung. No significant pneumothorax. Scattered LEFT basilar airspace opacities, likely atelectasis. Visualized abdomen is unremarkable. Multilevel degenerative changes of the thoracic spine. IMPRESSION: Interval placement of RIGHT-sided pigtail catheter with persistent large RIGHT pleural  effusion and complete opacification of the RIGHT lung. No significant pneumothorax. Electronically Signed   By: Valentino Saxon MD   On: 01-18-2020 09:08   DG Chest Portable 1 View  Result Date: 12/09/2019 CLINICAL DATA:  Shortness of breath EXAM: PORTABLE CHEST 1 VIEW COMPARISON:  04/07/2019 FINDINGS: 2 frontal views of the chest are obtained. There is complete opacification of the right hemithorax, with slight mediastinal shift to the left. This suggests underlying right pleural effusion  as well as lung consolidation. The left chest is clear. No evidence pneumothorax. Stable postsurgical changes from median sternotomy. IMPRESSION: 1. Interval opacification of the right hemithorax consistent with effusion and consolidation. CT chest may be useful for further evaluation. Electronically Signed   By: Randa Ngo M.D.   On: 12/23/2019 20:15    Outpatient Dialysis Orders: Center: [facility] on MWF. 4 hours, Optiflux 200NRe, BFR 450, DFR 800, 2K, 2 Ca, AVF 15g, heparin 2900 unit bolus, EDW 100kg Mircera 30 mcg IVP q 2 weeks- last does 12/04/19 Venofer 50mg  weekly  Assessment/Plan: 1.  R pleural effusion with midline shift: S/p urgent chest tube placement, already drained 1.1L so far. Pleural studies pending.  2.  ESRD:  Continue MWF schedule. K+ 3.1 today, plan to use 4K bath. 3.  Hypertension/volume: Hypotensive with massive R pleural effusion as above, s/p chest tube placement. Suspect weight here is inaccurate, pt was 99.9kg after HD yesterday. Pleural fluid studies pending. Pt does typically have minimal UF with HD due to hypotension and takes midodrine. Tentatively plan for HD tomorrow with 1L UF, may need to be adjusted depending on clinical course.  4.  Anemia: Hgb at goal. No ESA indicated at present.  5.  Metabolic bone disease: Corrected calcium high, hold calcitriol for now. Parsabiv not on formulary. Continue renvela once tolerating PO and follow phos.   Anice Paganini, PA-C 2020/01/18, 9:33 AM  [facility] Pager: [phone]

## 2020-01-03 NOTE — Progress Notes (Signed)
Updated son, Mitzi Hansen

## 2020-01-03 NOTE — Death Summary Note (Signed)
DEATH SUMMARY   Patient Details  Name: [name] MRN: [id number] DOB: Jan 13, 1951  Admission/Discharge Information   Admit Date:  01/14/2020  Date of Death: Date of Death: 01/15/2020  Time of Death: Time of Death: July 21, 1432  Length of Stay: 1  Referring Physician: [name], MD   Reason(s) for Hospitalization  Acute hypoxic respiratory failure  Diagnoses  Preliminary cause of death: Acute Hypoxic Respiratory Failure 2/2 malignant massive Right sided pleural effusion. Secondary Diagnoses (including complications and co-morbidities):  Acute hypoxic respiratory failure/Massive right-sided malignant pleural effusion with mediastinal shift and occlusion of right mainstem bronchus  Elevated troponin with EKG changes/CAD:Not a candidate for invasive cardiac work-up/cath.seen by cardiology this admission. Metastatic renal cell carcinoma.  Pleural based mass at the right inferior costophrenic recess. The metastatic lesion between the right ninth and tenth ribs-unchanged in size. New right tenth posterolateral  Pathological rib fracture Hypotension chronic:on midodrine. BP worsening concern for tension- had Pleurx catheter and was getting fluid removal. bp was coming up in 84 after iv bolus by PCCM. Baseline in 90s ESRD on dialysis  Thrombocytopenia: plt at 563O  Anemia Metabolic bone disease DM type 2 , stable hba1c 6.5 in 2018-07-22. Hx of Sinus bradycardia-not a candidate for PM per cardio on previous visits. Paroxysmal atrial fibrillation Hypokalemia COVID 19 infection in 03/2019. Blind both eyes/Debility/Deconditiong and very poor health with complex medical comorbidities.  Brief Hospital Course (including significant findings, care, treatment, and services provided and events leading to death)  [name] is a 69 y.o. year old male w/ ESRD on HD, T2DM blindness in both eyes, HTN, prostate cancer, metastatic renal cancer admitted form SNF this am with acute hypoxic respiratory  failure with massive right-sided malignant pleural effusion with mediastinal shift and occlusion of right mainstem bronchus, seen by PCCM. Pt was DNR/DNI- ICU felt no role of icu admission and advised goals of care discussion or palliative care and asked to admit under [place]. On admission CT showed "Slight interval increase in size of pleural based mass at the right inferior costophrenic recess. The metastatic lesion between the right ninth and tenth ribs is unchanged in size. New right tenth posterolateral rib fracture, possibly pathologic" He was noted to have Eelvated Troponin,trop 483>457,  w/ EKG with anterolateral T wave inversions.Cardiac consulted in the morning and saw him and felt to be high risk for any kind of invasive cardiac procedure -not a candidate for catheter advance cardiology work-up and cardiology advised comfort measures/palliative care approach.He has hypotension-takes midodrine, baseline sbp in 90s. Patient with DNR status admitted with severe acute medical illness in the setting of significant complex comorbidities.  He was being managed appropriately with the help of consult service including critical care, cardiology and nephrology.  Patient had decline in his clinical condition as outlined below and critical care subsequently discussed with [name] and then further procedures were not continued and changed to comfort measures and he passed away, did not need any pain meds/sedatives. See below for discussion. ------------------- Seen by PCCM for hypoxic respiratory failure secondary to very Large right-sided pleural effusion with mediastinal shift and occlusion of right mainstem bronchus: s/p Pleurx catheter placement and was having significant output with plan to drain slowly, blood pressure was worsening. Chest tube got pulled out and critical care was at bedside addressing it-then patient was further declining and resp status was worsening- pt was DNR/DNI on admission and  would not want intubation- PCCM team then discussed with patient's son Mitzi Hansen about his  overall decline and after discussion as per son's wishes he was transitioned to comfort measures. Following which I was notified about the events and patient subsequently passed away and family was notified by PCCM Dr Cicero Duck and ED team.  Pertinent Labs and Studies  Significant Diagnostic Studies CT Angio Chest PE W/Cm &/Or Wo Cm  Result Date: 12/28/2019 CLINICAL DATA:  Metastatic disease evaluation, body aches following dialysis renal cell carcinoma, prostate cancer chest pain short of breath EXAM: CT ANGIOGRAPHY CHEST CT ABDOMEN AND PELVIS WITH CONTRAST TECHNIQUE: Multidetector CT imaging of the chest was performed using the standard protocol during bolus administration of intravenous contrast. Multiplanar CT image reconstructions and MIPs were obtained to evaluate the vascular anatomy. Multidetector CT imaging of the abdomen and pelvis was performed using the standard protocol during bolus administration of intravenous contrast. CONTRAST:  158mL OMNIPAQUE IOHEXOL 350 MG/ML SOLN COMPARISON:  Chest x-ray 12/15/2019, CT chest abdomen pelvis 06/14/2018, PET CT 07/27/2017, CT chest abdomen and pelvis 07/04/2019 FINDINGS: CTA CHEST FINDINGS Cardiovascular: Satisfactory opacification of the pulmonary arteries to the segmental level. No evidence of pulmonary embolism. Nonaneurysmal aorta. Mild aortic atherosclerosis. Coronary vascular calcifications. Normal cardiac size. No significant pericardial effusion Mediastinum/Nodes: No thyroid mass. Esophagus within normal limits. The distal trachea appears narrowed above the bifurcation. There is filling of the right mainstem bronchus with fluid, debris or soft tissue. There is narrowed appearance of the left bronchus as well. Esophagus is displaced to the left. Possible enlarged subcarinal node though difficult to separate from adjacent collapsed lung. Lungs/Pleura: Massive right  pleural effusion with atelectasis of the right. Shift of mediastinal contents to the left. No pneumothorax. Left lower lobe bronchial wall thickening. Musculoskeletal: Soft tissue mass between the right ninth and tenth ribs measures 2.1 x 1.1 cm, previously 2.2 x 1.1 cm. Slight increased size of pleural based mass at the inferior costophrenic recess measuring 4 by 2 cm, previously 1.7 x 1.2 cm. New tenth posterolateral rib fracture, possibly pathologic. Post sternotomy changes. Diffuse spondylosis of the spine. Review of the MIP images confirms the above findings. CT ABDOMEN and PELVIS FINDINGS Hepatobiliary: Status post cholecystectomy. No biliary dilatation. No focal hepatic abnormality Pancreas: Atrophic.  No inflammatory change. Spleen: Normal in size without focal abnormality. Adrenals/Urinary Tract: Adrenal glands are normal. Status post right nephrectomy. Atrophic left kidney with numerous cortical lesions many of which are subcentimeter. Some of the lesions demonstrate increased density. No hydronephrosis. Intrarenal vascular calcification. The bladder is unremarkable. Stomach/Bowel: The stomach is nonenlarged. No dilated small bowel. Status post partial right colectomy with unremarkable ileal colic anastomosis in the right abdomen. No acute wall thickening. Vascular/Lymphatic: Extensive vascular calcification without aneurysm. No suspicious adenopathy Reproductive: Multiple prostate seeds. Other: Negative for free air or free fluid. Increased edema within the right flank and lateral abdominal wall Musculoskeletal: Left intramedullary femoral rod. Ankylosis of the spine. No acute osseous abnormality. Review of the MIP images confirms the above findings. IMPRESSION: 1. Negative for acute pulmonary embolus. 2. Massive right pleural effusion with atelectasis of the right lung and shift of mediastinal contents to the left. There right mainstem bronchus is occluded with fluid, debris, or soft tissue and there is  significant narrowing of the left mainstem bronchus, also with fluid, tissue or debris in the bronchus. 3. Slight interval increase in size of pleural based mass at the right inferior costophrenic recess. The metastatic lesion between the right ninth and tenth ribs is unchanged in size. New right tenth posterolateral rib fracture, possibly pathologic. 4. Possible  subcarinal adenopathy though difficult to separate from adjacent atelectatic lung. Consider chest CT follow-up after resolution of pleural effusion. 5. No CT evidence for acute intra-abdominal or pelvic abnormality. 6. Status post right nephrectomy. Atrophic left kidney with numerous cortical lesions of varying density, grossly unchanged. 7. Increased edema within the right flank and lateral abdominal wall. Aortic Atherosclerosis (ICD10-I70.0). Electronically Signed   By: Donavan Foil M.D.   On: 2019/12/20 01:19   CT Abdomen Pelvis W Contrast  Result Date: 2019/12/20 CLINICAL DATA:  Metastatic disease evaluation, body aches following dialysis renal cell carcinoma, prostate cancer chest pain short of breath EXAM: CT ANGIOGRAPHY CHEST CT ABDOMEN AND PELVIS WITH CONTRAST TECHNIQUE: Multidetector CT imaging of the chest was performed using the standard protocol during bolus administration of intravenous contrast. Multiplanar CT image reconstructions and MIPs were obtained to evaluate the vascular anatomy. Multidetector CT imaging of the abdomen and pelvis was performed using the standard protocol during bolus administration of intravenous contrast. CONTRAST:  12mL OMNIPAQUE IOHEXOL 350 MG/ML SOLN COMPARISON:  Chest x-ray 12/08/2019, CT chest abdomen pelvis 06/14/2018, PET CT 07/27/2017, CT chest abdomen and pelvis 07/04/2019 FINDINGS: CTA CHEST FINDINGS Cardiovascular: Satisfactory opacification of the pulmonary arteries to the segmental level. No evidence of pulmonary embolism. Nonaneurysmal aorta. Mild aortic atherosclerosis. Coronary vascular  calcifications. Normal cardiac size. No significant pericardial effusion Mediastinum/Nodes: No thyroid mass. Esophagus within normal limits. The distal trachea appears narrowed above the bifurcation. There is filling of the right mainstem bronchus with fluid, debris or soft tissue. There is narrowed appearance of the left bronchus as well. Esophagus is displaced to the left. Possible enlarged subcarinal node though difficult to separate from adjacent collapsed lung. Lungs/Pleura: Massive right pleural effusion with atelectasis of the right. Shift of mediastinal contents to the left. No pneumothorax. Left lower lobe bronchial wall thickening. Musculoskeletal: Soft tissue mass between the right ninth and tenth ribs measures 2.1 x 1.1 cm, previously 2.2 x 1.1 cm. Slight increased size of pleural based mass at the inferior costophrenic recess measuring 4 by 2 cm, previously 1.7 x 1.2 cm. New tenth posterolateral rib fracture, possibly pathologic. Post sternotomy changes. Diffuse spondylosis of the spine. Review of the MIP images confirms the above findings. CT ABDOMEN and PELVIS FINDINGS Hepatobiliary: Status post cholecystectomy. No biliary dilatation. No focal hepatic abnormality Pancreas: Atrophic.  No inflammatory change. Spleen: Normal in size without focal abnormality. Adrenals/Urinary Tract: Adrenal glands are normal. Status post right nephrectomy. Atrophic left kidney with numerous cortical lesions many of which are subcentimeter. Some of the lesions demonstrate increased density. No hydronephrosis. Intrarenal vascular calcification. The bladder is unremarkable. Stomach/Bowel: The stomach is nonenlarged. No dilated small bowel. Status post partial right colectomy with unremarkable ileal colic anastomosis in the right abdomen. No acute wall thickening. Vascular/Lymphatic: Extensive vascular calcification without aneurysm. No suspicious adenopathy Reproductive: Multiple prostate seeds. Other: Negative for free air  or free fluid. Increased edema within the right flank and lateral abdominal wall Musculoskeletal: Left intramedullary femoral rod. Ankylosis of the spine. No acute osseous abnormality. Review of the MIP images confirms the above findings. IMPRESSION: 1. Negative for acute pulmonary embolus. 2. Massive right pleural effusion with atelectasis of the right lung and shift of mediastinal contents to the left. There right mainstem bronchus is occluded with fluid, debris, or soft tissue and there is significant narrowing of the left mainstem bronchus, also with fluid, tissue or debris in the bronchus. 3. Slight interval increase in size of pleural based mass at the right inferior costophrenic  recess. The metastatic lesion between the right ninth and tenth ribs is unchanged in size. New right tenth posterolateral rib fracture, possibly pathologic. 4. Possible subcarinal adenopathy though difficult to separate from adjacent atelectatic lung. Consider chest CT follow-up after resolution of pleural effusion. 5. No CT evidence for acute intra-abdominal or pelvic abnormality. 6. Status post right nephrectomy. Atrophic left kidney with numerous cortical lesions of varying density, grossly unchanged. 7. Increased edema within the right flank and lateral abdominal wall. Aortic Atherosclerosis (ICD10-I70.0). Electronically Signed   By: Donavan Foil M.D.   On: 2019/12/27 01:19   DG CHEST PORT 1 VIEW  Result Date: 12/27/19 CLINICAL DATA:  RIGHT chest tube placement EXAM: PORTABLE CHEST 1 VIEW COMPARISON:  September fifteenth 2021, 12/27/19 FINDINGS: The cardiomediastinal silhouette is unchanged in contour and is partially obscured. Status post median sternotomy.Interval placement of a RIGHT-sided pigtail catheter. Revisualization of a large RIGHT pleural effusion with complete opacification of the RIGHT lung. No significant pneumothorax. Scattered LEFT basilar airspace opacities, likely atelectasis. Visualized abdomen  is unremarkable. Multilevel degenerative changes of the thoracic spine. IMPRESSION: Interval placement of RIGHT-sided pigtail catheter with persistent large RIGHT pleural effusion and complete opacification of the RIGHT lung. No significant pneumothorax. Electronically Signed   By: Valentino Saxon MD   On: 12/27/19 09:08   DG Chest Portable 1 View  Result Date: 12/09/2019 CLINICAL DATA:  Shortness of breath EXAM: PORTABLE CHEST 1 VIEW COMPARISON:  04/07/2019 FINDINGS: 2 frontal views of the chest are obtained. There is complete opacification of the right hemithorax, with slight mediastinal shift to the left. This suggests underlying right pleural effusion as well as lung consolidation. The left chest is clear. No evidence pneumothorax. Stable postsurgical changes from median sternotomy. IMPRESSION: 1. Interval opacification of the right hemithorax consistent with effusion and consolidation. CT chest may be useful for further evaluation. Electronically Signed   By: Randa Ngo M.D.   On: 12/16/2019 20:15    Microbiology Recent Results (from the past 240 hour(s))  SARS Coronavirus 2 by RT PCR (hospital order, performed in [facility] hospital lab) Nasopharyngeal Nasopharyngeal Swab     Status: None   Collection Time: 12/17/2019  8:46 PM   Specimen: Nasopharyngeal Swab  Result Value Ref Range Status   SARS Coronavirus 2 NEGATIVE NEGATIVE Final    Comment: (NOTE) SARS-CoV-2 target nucleic acids are NOT DETECTED.  The SARS-CoV-2 RNA is generally detectable in upper and lower respiratory specimens during the acute phase of infection. The lowest concentration of SARS-CoV-2 viral copies this assay can detect is 250 copies / mL. A negative result does not preclude SARS-CoV-2 infection and should not be used as the sole basis for treatment or other patient management decisions.  A negative result may occur with improper specimen collection / handling, submission of specimen other than  nasopharyngeal swab, presence of viral mutation(s) within the areas targeted by this assay, and inadequate number of viral copies (<250 copies / mL). A negative result must be combined with clinical observations, patient history, and epidemiological information.  Fact Sheet for Patients:   StrictlyIdeas.no  Fact Sheet for Healthcare Providers: BankingDealers.co.za  This test is not yet approved or  cleared by the [place] FDA and has been authorized for detection and/or diagnosis of SARS-CoV-2 by FDA under an Emergency Use Authorization (EUA).  This EUA will remain in effect (meaning this test can be used) for the duration of the COVID-19 declaration under Section 564(b)(1) of the Act, 21 U.S.C. section 360bbb-3(b)(1),  unless the authorization is terminated or revoked sooner.  Performed at [facility], [place] [address] [place], [place] [address]   Culture, blood (routine x 2)     Status: None (Preliminary result)   Collection Time: 12/17/2019  8:46 PM   Specimen: BLOOD  Result Value Ref Range Status   Specimen Description BLOOD RIGHT ANTECUBITAL  Final   Special Requests   Final    BOTTLES DRAWN AEROBIC AND ANAEROBIC Blood Culture adequate volume   Culture   Final    NO GROWTH < 24 HOURS Performed at [facility], [place] [address] [place], [place] [address]    Report Status PENDING  Incomplete  Body fluid culture (includes gram stain)     Status: None (Preliminary result)   Collection Time: January 11, 2020  8:55 AM   Specimen: Pleural Fluid  Result Value Ref Range Status   Specimen Description PLEURAL FLUID  Final   Special Requests RIGHT  Final   Gram Stain   Final    NO WBC SEEN NO ORGANISMS SEEN Performed at [facility], [place] [address] [place], [place] [address]    Culture PENDING  Incomplete   Report Status PENDING  Incomplete    Lab Basic Metabolic Panel: Recent Labs  Lab 01/02/2020 2041  NA 141  K  3.1*  CL 97*  CO2 32  GLUCOSE 98  BUN 18  CREATININE 2.94*  CALCIUM 9.6  MG 2.1   Liver Function Tests: Recent Labs  Lab 12/14/2019 2041  AST 16  ALT 12  ALKPHOS 85  BILITOT 0.6  PROT 5.9*  ALBUMIN 2.8*   No results for input(s): LIPASE, AMYLASE in the last 168 hours. No results for input(s): AMMONIA in the last 168 hours. CBC: Recent Labs  Lab 12/12/2019 2014  WBC 7.0  HGB 11.0*  HCT 36.2*  MCV 104.0*  PLT 145*   Cardiac Enzymes: No results for input(s): CKTOTAL, CKMB, CKMBINDEX, TROPONINI in the last 168 hours. Sepsis Labs: Recent Labs  Lab 12/04/2019 2014 12/18/19 2046  WBC 7.0  --   LATICACIDVEN  --  1.1    Procedures/Operations  See note   Antonieta Pert January 11, 2020, 5:56 PM

## 2020-01-03 NOTE — Progress Notes (Addendum)
  I discussed the patient's clinical decline this afternoon with patient's son. I express that I am concerned the patient is at the end of life and his body is declaring itself. Pt is DNR/DNI. [name] wishes for his father to be made comfortable and transition to comfort measures only.  Awaiting callback from Primary team to update. In interim I will place analgesic and anxiolytic orders.    Eliseo Gum MSN, AGACNP-BC [place] [phone] If no answer, [phone] January 03, 2020, 2:19 PM

## 2020-01-03 NOTE — Progress Notes (Addendum)
Pt with increased SOB this morning, down trending BP concerning for possible tension.  Verbal consent obtained from patient for thoracentesis and/or chest tube placement for Right sided pleural effusion.  Risks and benefits discussed with patient.   Calling PCCM MD to bedside in ED for procedure.  Eliseo Gum MSN, AGACNP-BC [place] [phone] If no answer, [phone] 12/22/19, 8:34 AM

## 2020-01-03 NOTE — ED Notes (Signed)
Patient transported to CT scan . 

## 2020-01-03 NOTE — Consult Note (Addendum)
NAME:  [name], MRN:  [id number], DOB:  [birth year], LOS: 0 ADMISSION DATE:  12/23/2019, CONSULTATION DATE:  9/16 REFERRING MD:  Dr Betsey Holiday, CHIEF COMPLAINT:  Pleural effusion   Brief History   69 year old male with ESRD on HD and metastatic renal cell ca history admitted 9/16 with massive R sided pleural effusion.   History of present illness   69 year old male with PMH as below, which is significant for DM, Blind in both eyes, ESRD on HD mwf, metasatic renal cell carcinoma 2001 and 2003, and chronic hypotension. He resides in SNF. He presented to [facility] ED 9/16 with complaints of malaise and cough which started 9/11. Then 9/16 he developed chills and body aches after HD. Symptoms also progressed to include shortness of breath, which prompted ED presentation. Initial workup in the ED significant for K 3.1, Troponin 483, COVID negative. CXR demonstrated complete opacification of the R hemithorax. CT was done to further evaluate, which confirmed massive r sided effusion. Of note his baseline SBP is reportedly in the 90s. PCCM consulted for effusion.    Past Medical History   has a past medical history of Anemia, Arthritis, Blind, Bruises easily, CAD, NATIVE VESSEL (01/08/2008), Cellulitis (late 1980's), Colon polyps, Diabetic neuropathy ([place]), Dialysis patient ([place]), DM type 2 (diabetes mellitus, type 2) ([place]), Dysrhythmia, ESRD (end stage renal disease) on dialysis ([place]), Fall, Family history of breast cancer, Heart murmur, History of blood product transfusion, HYPERLIPIDEMIA-MIXED (01/08/2008), HYPERTENSION (01/27/2009), Hypotension, Kidney stones, Low blood pressure, OVERWEIGHT/OBESITY (01/08/2008), Peripheral vascular disease ([place]), Pneumonia (2000;s X 1), Prostate cancer ([place]), Prosthetic eye globe, Renal cell carcinoma (2001 and 2003), and Renal insufficiency.   Significant Hospital Events     Consults:    Procedures:    Significant Diagnostic Tests:  CTA chest 9/16 > Negative  for acute pulmonary embolus. Massive right pleural effusion with atelectasis of the right lung and shift of mediastinal contents to the left. There right mainstem bronchus is occluded with fluid, debris, or soft tissue and there is significant narrowing of the left mainstem bronchus, also with fluid, tissue or debris in the bronchus. Slight interval increase in size of pleural based mass at the right inferior costophrenic recess. The metastatic lesion between the right ninth and tenth ribs is unchanged in size. New right tenth posterolateral rib fracture, possibly pathologic. Possible subcarinal adenopathy though difficult to separate from adjacent atelectatic lung.  CT abd/pelvis 9/16 > No CT evidence for acute intra-abdominal or pelvic abnormality. Status post right nephrectomy. Atrophic left kidney with numerous cortical lesions of varying density, grossly unchanged. Increased edema within the right flank and lateral abdominal wall.  Micro Data:    Antimicrobials:     Interim history/subjective:    Objective   Blood pressure 110/67, pulse 61, temperature (!) 96.3 F (35.7 C), temperature source Rectal, resp. rate 18, height 6\' 4"  (1.93 m), weight 110.7 kg, SpO2 97 %.       No intake or output data in the 24 hours ending 01/06/20 0330 Filed Weights   12/04/2019 2025  Weight: 110.7 kg    Examination: General: Elderly male in NAD lying on R side.  HENT: [place]/AT, remote bilateral enucleation, No appreciable JVD Lungs: Diminished R, Clear L. Unlabored on 4L [place] with O2 sat 100%.  Cardiovascular: RRR Abdomen: Soft, non-distended Extremities: Frail extremities, no edema.  Neuro: Somnolent, will arouse to verbal to answer yes/no questions.   Resolved Hospital Problem list     Assessment & Plan:  Massive R sided pleural effusion.This is likely a malignant effusion given the presence of ipsilateral mass and pathologic fractures. He is currently in no distress and is on 4L with O2 sats  100%. Will need to decide what to do re: thoracentesis vs Pleurex catheter. Goals of care discussion vs palliative care consult also seem like a reasonable addition. Per ED note the patient is DNR and would not want intubation.  - No role for ICU admission at this time considering his goals for care.  - PCCM day team will round in AM to determine appropriate tx thoracentesis vs Pleurex - Supplemental O2 to keep sats > 57%  Acute metabolic encephalopathy - per primary  Hypotension: chronic. Baseline SBP reportedly in 90s, he is at his baseline. Lactic acid 1.1 consistent with this.  - has received IV hydration as he is post HD today.   PAF on warfarin. INR 1.1 on admission - hold [facility] anticipating pleural procedures.   ESRD on HD - per primary/nephrology  Best practice:  Diet: NPO Pain/Anxiety/Delirium protocol (if indicated): NA VAP protocol (if indicated): NA DVT prophylaxis: per primary GI prophylaxis: NA Glucose control: per primary Mobility: Bedrest Code Status: DNR DNI per ED note, have not confirmed this with patient or family.  Family Communication:  Disposition:   Labs   CBC: Recent Labs  Lab 12/24/2019 2014  WBC 7.0  HGB 11.0*  HCT 36.2*  MCV 104.0*  PLT 145*    Basic Metabolic Panel: Recent Labs  Lab 12/04/2019 2041  NA 141  K 3.1*  CL 97*  CO2 32  GLUCOSE 98  BUN 18  CREATININE 2.94*  CALCIUM 9.6  MG 2.1   GFR: Estimated Creatinine Clearance: 31.9 mL/min (A) (by C-G formula based on SCr of 2.94 mg/dL (H)). Recent Labs  Lab 12/21/2019 2014 12/26/2019 2046  WBC 7.0  --   LATICACIDVEN  --  1.1    Liver Function Tests: Recent Labs  Lab 12/17/2019 2041  AST 16  ALT 12  ALKPHOS 85  BILITOT 0.6  PROT 5.9*  ALBUMIN 2.8*   No results for input(s): LIPASE, AMYLASE in the last 168 hours. No results for input(s): AMMONIA in the last 168 hours.  ABG    Component Value Date/Time   PHART 7.294 (L) 09/29/2011 1830   PCO2ART 47.6 (H) 09/29/2011 1830    PO2ART 231.0 (H) 09/29/2011 1830   HCO3 23.0 09/29/2011 1830   TCO2 29 03/30/2019 2155   ACIDBASEDEF 3.0 (H) 09/29/2011 1830   O2SAT 100.0 09/29/2011 1830     Coagulation Profile: Recent Labs  Lab 12/05/2019 2046  INR 1.1    Cardiac Enzymes: No results for input(s): CKTOTAL, CKMB, CKMBINDEX, TROPONINI in the last 168 hours.  HbA1C: Hgb A1c MFr Bld  Date/Time Value Ref Range Status  03/21/2019 08:01 AM 6.5 (H) 4.8 - 5.6 % Final    Comment:    (NOTE) Pre diabetes:          5.7%-6.4% Diabetes:              >6.4% Glycemic control for   <7.0% adults with diabetes   04/09/2016 11:52 PM 4.8 4.8 - 5.6 % Final    Comment:    (NOTE)         Pre-diabetes: 5.7 - 6.4         Diabetes: >6.4         Glycemic control for adults with diabetes: <7.0     CBG: No results for input(s): GLUCAP  in the last 168 hours.  Review of Systems:   Patient is encephalopathic and/or intubated. Therefore history has been limited.  Bolds are positive  Constitutional: weight loss, gain, night sweats, Fevers, chills, fatigue .  HEENT: headaches, Sore throat, sneezing, nasal congestion, post nasal drip, Difficulty swallowing, Tooth/dental problems, visual complaints visual changes, ear ache CV:  chest pain, radiates:,Orthopnea, PND, swelling in lower extremities, dizziness, palpitations, syncope.  GI  heartburn, indigestion, abdominal pain, nausea, vomiting, diarrhea, change in bowel habits, loss of appetite, bloody stools.  Resp: cough, productive:, hemoptysis, dyspnea, chest pain, pleuritic.  Skin: rash or itching or icterus GU: dysuria, change in color of urine, urgency or frequency. flank pain, hematuria  MS: joint pain or swelling. decreased range of motion  Psych: change in mood or affect. depression or anxiety.  Neuro: difficulty with speech, weakness, numbness, ataxia    Past Medical History  He,  has a past medical history of Anemia, Arthritis, Blind, Bruises easily, CAD, NATIVE VESSEL  (01/08/2008), Cellulitis (late 1980's), Colon polyps, Diabetic neuropathy ([place]), Dialysis patient ([place]), DM type 2 (diabetes mellitus, type 2) ([place]), Dysrhythmia, ESRD (end stage renal disease) on dialysis ([place]), Fall, Family history of breast cancer, Heart murmur, History of blood product transfusion, HYPERLIPIDEMIA-MIXED (01/08/2008), HYPERTENSION (01/27/2009), Hypotension, Kidney stones, Low blood pressure, OVERWEIGHT/OBESITY (01/08/2008), Peripheral vascular disease ([place]), Pneumonia (2000;s X 1), Prostate cancer ([place]), Prosthetic eye globe, Renal cell carcinoma (2001 and 2003), and Renal insufficiency.   Surgical History    Past Surgical History:  Procedure Laterality Date  . AV FISTULA PLACEMENT Left 02/2010   LFA  . AV FISTULA PLACEMENT Left 01/12/2016   Procedure: LEFT BRACHIOCEPHALIC ARTERIOVENOUS (AV) FISTULA CREATION;  Surgeon: Angelia Mould, MD;  Location: [place];  Service: Vascular;  Laterality: Left;  . CHOLECYSTECTOMY OPEN  1992  . COLONOSCOPY  06/14/2011   Procedure: COLONOSCOPY;  Surgeon: [name], MD;  Location: [facility];  Service: Endoscopy;  Laterality: N/A;  . COLONOSCOPY N/A 07/24/2012   Procedure: COLONOSCOPY;  Surgeon: [name], MD;  Location: [facility];  Service: Endoscopy;  Laterality: N/A;  . COLONOSCOPY    . COLONOSCOPY N/A 11/04/2015   Procedure: COLONOSCOPY;  Surgeon: Manus Gunning, MD;  [facility] END[facility]COPY;  Service: Gastroenterology;  Laterality: N/A;  . COLONOSCOPY WITH PROPOFOL N/A 05/13/2014   Procedure: COLONOSCOPY WITH PROPOFOL;  Surgeon: [name], MD;  [facility]: [facility];  Service: Endoscopy;  Laterality: N/A;  . CORONARY ARTERY BYPASS GRAFT  09/29/2011   Procedure: CORONARY ARTERY BYPASS GRAFTING (CABG);  Surgeon: [name], MD;  Location: [place];  Service: Open Heart Surgery;  Laterality: N/A;  Coronary Artery Bypass Graft times two utilizing the left internal mammary artery and the right greater saphenous vein  harvested endoscopically.  . CYSTOSCOPY WITH RETROGRADE PYELOGRAM, URETEROSCOPY AND STENT PLACEMENT Left 07/28/2014   Procedure: CYSTOSCOPY WITH RETROGRADE PYELOGRAM, URETERAL BALLOON DILITATION, URETEROSCOPY AND LEFT STENT PLACEMENT;  Surgeon: [name], MD;  Location: [facility];  Service: Urology;  Laterality: Left;  . ENUCLEATION  2003; 2006   bilateral; "diabetes; pain"  . FOOT AMPUTATION Right ~ 2002   right;  partial; "infection"  . FRACTURE SURGERY     hip fx  . HIP PINNING,CANNULATED Left 12/31/2013   Procedure: CANNULATED HIP PINNING;  Surgeon: Renette Butters, MD;  Location: [place];  Service: Orthopedics;  Laterality: Left;  Carm, FX Table, [place]  . HOT HEMOSTASIS  06/14/2011   Procedure: HOT HEMOSTASIS (ARGON PLASMA COAGULATION/BICAP);  Surgeon: Herbie Baltimore  Shaaron Adler, MD;  Location[facility];  Service: Endoscopy;  Laterality: N/A;  . INSERTION OF DIALYSIS CATHETER Right 01/12/2016   Procedure: INSERTION OF DIALYSIS CATHETER;  Surgeon: Angelia Mould, MD;  Location: [place];  Service: Vascular;  Laterality: Right;  . INSERTION PROSTATE RADIATION SEED  03/2012  . IR GENERIC HISTORICAL  01/13/2016   IR RADIOLOGIST EVAL & MGMT 01/13/2016 Aletta Edouard, MD [facility] INTERV RAD  . IR RADIOLOGIST EVAL & MGMT  03/02/2017  . IR RADIOLOGIST EVAL & MGMT  05/18/2017  . IR RADIOLOGIST EVAL & MGMT  07/27/2017  . LAPAROTOMY N/A 04/07/2016   Procedure: EXPLORATORY LAPAROTOMY AND RESECTION OF RETROPERITONEAL MASS;  Surgeon: Raynelle Bring, MD;  [facility];  Service: Urology;  Laterality: N/A;  . LEFT HEART CATHETERIZATION WITH CORONARY ANGIOGRAM N/A 09/27/2011   Procedure: LEFT HEART CATHETERIZATION WITH CORONARY ANGIOGRAM;  Surgeon: [name], MD;  Location: [facility] CATH LAB;  Service: Cardiovascular;  Laterality: N/A;  . LIGATION OF ARTERIOVENOUS  FISTULA Left 01/12/2016   Procedure: LIGATION OF LEFT RADIOCEPHALIC ARTERIOVENOUS  FISTULA;  Surgeon: Angelia Mould, MD;  Location: [place];   Service: Vascular;  Laterality: Left;  . LIGATION OF COMPETING BRANCHES OF ARTERIOVENOUS FISTULA Left 07/29/2014   Procedure: LIGATION OF COMPETING BRANCHES OF LEFT ARM ARTERIOVENOUS FISTULA;  Surgeon: [name], MD;  Location: [place];  Service: Vascular;  Laterality: Left;  . NEPHRECTOMY Right 01/30/2015   done at [place]  . PARTIAL COLECTOMY Right 04/07/2016   Procedure: RIGHT COLECTOMY AND PARTIAL LIVER RESECTION;  Surgeon: Raynelle Bring, MD;  Location: [facility];  Service: Urology;  Laterality: Right;  . RADIOFREQUENCY ABLATION N/A 04/19/2017   Procedure: CT MICROWAVE THERMAL ABLATION;  Surgeon: Aletta Edouard, MD;  Location[facility];  Service: Anesthesiology;  Laterality: N/A;  . RADIOFREQUENCY ABLATION KIDNEY  2010-2012   "twice; one on each side; for cancer"  . REVISON OF ARTERIOVENOUS FISTULA Left 09/01/2015   Procedure: EXPLORATION LEFT LOWER ARM  RADIOCEPHALIC ARTERIOVENOUS FISTULA;  Surgeon: Conrad [place], MD;  Location: [place];  Service: Vascular;  Laterality: Left;  [name] VARICOSE VEIN SURGERY  mid 1980's    BLE; "knees down; both legs; 2 separate times"     Social History   reports that he has never smoked. He has never used smokeless tobacco. He reports that he does not drink alcohol and does not use drugs.   Family History   His family history includes Breast cancer in an other family member; Cancer in his brother, sister, and another family member; Cancer (age of onset: 87) in his father; Cancer (age of onset: 55) in his mother; Coronary artery disease in an other family member; Diabetes in an other family member; Emphysema in his father; Heart attack in his brother; Heart disease in his brother, father, and mother; Hyperlipidemia in his brother, father, and mother; Hypertension in his brother, father, and mother; Kidney disease in an other family member; Lung cancer in an other family member; Ovarian cancer in an other family member; Peripheral vascular disease in his brother;  Varicose Veins in his mother. There is no history of Malignant hyperthermia.   Allergies Allergies  Allergen Reactions  . Codeine Other (See Comments)     [place]  . Tape Other (See Comments)    Plastic tape tears skin off, please use paper tape instead.     Home Medications  Prior to Admission medications   Medication Sig Start Date End Date Taking?  Authorizing Provider  acetaminophen (TYLENOL) 325 MG tablet Take 2 tablets (650 mg total) by mouth every 6 (six) hours as needed for mild pain or headache (fever >/= 101). 03/28/19   Pokhrel, Corrie Mckusick, MD  albuterol (VENTOLIN HFA) 108 (90 Base) MCG/ACT inhaler Inhale 2 puffs into the lungs every 6 (six) hours as needed for wheezing or shortness of breath. 03/28/19   Pokhrel, Corrie Mckusick, MD  Amino Acids-Protein Hydrolys (FEEDING SUPPLEMENT, PRO-STAT SUGAR FREE 64,) LIQD Take 30 mLs by mouth 2 (two) times daily. 03/28/19   Pokhrel, Corrie Mckusick, MD  atorvastatin (LIPITOR) 10 MG tablet Take 10 mg by mouth daily.    [provider]  benzonatate (TESSALON) 100 MG capsule Take 100 mg by mouth every 8 (eight) hours as needed for cough.    [provider]  cyclobenzaprine (FLEXERIL) 10 MG tablet Take 10 mg by mouth every 12 (twelve) hours as needed. Neck pain 09/26/18   [provider]  ferrous sulfate 325 (65 FE) MG tablet Take 325 mg by mouth See admin instructions. 5am on Monday, Wednesday, Friday 8am on Sunday, Tuesday, Thursday, Saturday    [provider]  JANUVIA 25 MG tablet Take 25 mg by mouth See admin instructions. 8am on Sunday, Tuesday, Thursday, Saturday 5am on [place], Wednesday, Friday 03/04/19   [provider]  midodrine (PROAMATINE) 5 MG tablet Take 1 tablet (5 mg total) by mouth 3 (three) times daily with meals. 03/28/19   Pokhrel, Corrie Mckusick, MD  multivitamin (RENA-VIT) TABS tablet Take 1 tablet by mouth daily.    [provider]  Nutritional Supplements (FEEDING SUPPLEMENT,  NEPRO CARB STEADY,) LIQD Take 237 mLs by mouth 2 (two) times daily between meals. 03/28/19   Pokhrel, Corrie Mckusick, MD  ondansetron (ZOFRAN) 4 MG tablet Take 1 tablet (4 mg total) by mouth every 6 (six) hours as needed for nausea. 03/28/19   Pokhrel, Corrie Mckusick, MD  Probiotic Product (ALIGN) 4 MG CAPS Take 1 capsule by mouth See admin instructions. Take at 8am on Sunday, Tuesday, Thursday, Saturday Take at 5am on Monday, Wednesday, Friday    [provider]  sevelamer carbonate (RENVELA) 800 MG tablet Take 2,400-4,800 mg by mouth See admin instructions. Take 6 tablets at 8am (4800 mg) by mouth 3 times daily with meals on Tuesday, Thursday, Saturday, Sunday Take 6 tablets at 5am (4800 mg) by mouth 3 times daily with meals on Monday, Wednesday, Friday  Take 3 tablets (2400 mg) 2 times daily with snacks 06/26/14   [provider]  warfarin (COUMADIN) 1 MG tablet Take 1 tablet (1 mg total) by mouth daily. Adjust for INR 2-3, hold for 12/24 and 12/25 and start on 03/30/19 if INR is between 2-3 03/30/19 03/29/20  Flora Lipps, MD      Georgann Housekeeper, AGACNP-BC [place] for personal pager PCCM on call pager [phone]  01-12-2020 4:33 AM       Pt was seen and examined with Mr [facility] [place], NP  This is 69y/o white male with a very extensive medical history that presented to the ER with fever and chills. Chest CT shows a large pleural effusion.  The pt is currently on 4L [place] and does not appear in resp distress.  Assessment: Large right pleural effusion-  Pathologic rib fx Metastatic rib lesions Chronic Hypotension ESRD PAF  Plan: Admit to the medical service Plan for draining the effusion today Thoracentesis vs Pleurex cath placement Currently on [place] Pt wants to be a DNR/ DNI

## 2020-01-03 DEATH — deceased
